# Patient Record
Sex: Male | Born: 1975 | Race: White | Hispanic: No | Marital: Single | Smoking: Current every day smoker
Health system: Southern US, Community
[De-identification: ages and names within clinical notes are randomized; demographics above are authoritative.]

## PROBLEM LIST (undated history)

## (undated) DIAGNOSIS — I10 Essential (primary) hypertension: Secondary | ICD-10-CM

## (undated) DIAGNOSIS — G8929 Other chronic pain: Secondary | ICD-10-CM

## (undated) DIAGNOSIS — M51369 Other intervertebral disc degeneration, lumbar region without mention of lumbar back pain or lower extremity pain: Secondary | ICD-10-CM

## (undated) DIAGNOSIS — R918 Other nonspecific abnormal finding of lung field: Secondary | ICD-10-CM

## (undated) DIAGNOSIS — M549 Dorsalgia, unspecified: Secondary | ICD-10-CM

## (undated) DIAGNOSIS — S82202K Unspecified fracture of shaft of left tibia, subsequent encounter for closed fracture with nonunion: Secondary | ICD-10-CM

## (undated) DIAGNOSIS — F1910 Other psychoactive substance abuse, uncomplicated: Secondary | ICD-10-CM

## (undated) DIAGNOSIS — F909 Attention-deficit hyperactivity disorder, unspecified type: Secondary | ICD-10-CM

## (undated) DIAGNOSIS — B1920 Unspecified viral hepatitis C without hepatic coma: Secondary | ICD-10-CM

## (undated) DIAGNOSIS — F32A Depression, unspecified: Secondary | ICD-10-CM

## (undated) DIAGNOSIS — M5136 Other intervertebral disc degeneration, lumbar region: Secondary | ICD-10-CM

## (undated) DIAGNOSIS — M4640 Discitis, unspecified, site unspecified: Secondary | ICD-10-CM

## (undated) DIAGNOSIS — Z8711 Personal history of peptic ulcer disease: Secondary | ICD-10-CM

## (undated) DIAGNOSIS — Z87442 Personal history of urinary calculi: Secondary | ICD-10-CM

## (undated) DIAGNOSIS — M5430 Sciatica, unspecified side: Secondary | ICD-10-CM

## (undated) DIAGNOSIS — E119 Type 2 diabetes mellitus without complications: Secondary | ICD-10-CM

## (undated) DIAGNOSIS — K219 Gastro-esophageal reflux disease without esophagitis: Secondary | ICD-10-CM

## (undated) DIAGNOSIS — Z8719 Personal history of other diseases of the digestive system: Secondary | ICD-10-CM

## (undated) DIAGNOSIS — J449 Chronic obstructive pulmonary disease, unspecified: Secondary | ICD-10-CM

## (undated) DIAGNOSIS — F329 Major depressive disorder, single episode, unspecified: Secondary | ICD-10-CM

## (undated) DIAGNOSIS — Z8639 Personal history of other endocrine, nutritional and metabolic disease: Secondary | ICD-10-CM

## (undated) DIAGNOSIS — F4310 Post-traumatic stress disorder, unspecified: Secondary | ICD-10-CM

## (undated) DIAGNOSIS — F319 Bipolar disorder, unspecified: Secondary | ICD-10-CM

## (undated) HISTORY — DX: Chronic obstructive pulmonary disease, unspecified: J44.9

## (undated) HISTORY — DX: Essential (primary) hypertension: I10

## (undated) HISTORY — DX: Discitis, unspecified, site unspecified: M46.40

## (undated) HISTORY — PX: FRACTURE SURGERY: SHX138

## (undated) HISTORY — DX: Personal history of other endocrine, nutritional and metabolic disease: Z86.39

## (undated) HISTORY — DX: Unspecified fracture of shaft of left tibia, subsequent encounter for closed fracture with nonunion: S82.202K

## (undated) HISTORY — DX: Post-traumatic stress disorder, unspecified: F43.10

## (undated) HISTORY — DX: Other nonspecific abnormal finding of lung field: R91.8

---

## 2000-06-16 ENCOUNTER — Encounter: Payer: Self-pay | Admitting: Emergency Medicine

## 2000-06-16 ENCOUNTER — Emergency Department (HOSPITAL_COMMUNITY): Admission: EM | Admit: 2000-06-16 | Discharge: 2000-06-16 | Payer: Self-pay | Admitting: Emergency Medicine

## 2000-09-02 ENCOUNTER — Emergency Department (HOSPITAL_COMMUNITY): Admission: EM | Admit: 2000-09-02 | Discharge: 2000-09-02 | Payer: Self-pay | Admitting: Emergency Medicine

## 2000-11-06 ENCOUNTER — Encounter: Payer: Self-pay | Admitting: Emergency Medicine

## 2000-11-06 ENCOUNTER — Emergency Department (HOSPITAL_COMMUNITY): Admission: EM | Admit: 2000-11-06 | Discharge: 2000-11-06 | Payer: Self-pay | Admitting: Emergency Medicine

## 2001-03-13 ENCOUNTER — Emergency Department (HOSPITAL_COMMUNITY): Admission: EM | Admit: 2001-03-13 | Discharge: 2001-03-13 | Payer: Self-pay | Admitting: *Deleted

## 2001-03-22 ENCOUNTER — Encounter: Payer: Self-pay | Admitting: Emergency Medicine

## 2001-03-22 ENCOUNTER — Emergency Department (HOSPITAL_COMMUNITY): Admission: EM | Admit: 2001-03-22 | Discharge: 2001-03-22 | Payer: Self-pay | Admitting: Emergency Medicine

## 2002-08-27 ENCOUNTER — Emergency Department (HOSPITAL_COMMUNITY): Admission: EM | Admit: 2002-08-27 | Discharge: 2002-08-27 | Payer: Self-pay | Admitting: Emergency Medicine

## 2002-09-23 ENCOUNTER — Emergency Department (HOSPITAL_COMMUNITY): Admission: EM | Admit: 2002-09-23 | Discharge: 2002-09-23 | Payer: Self-pay | Admitting: *Deleted

## 2002-10-18 ENCOUNTER — Emergency Department (HOSPITAL_COMMUNITY): Admission: EM | Admit: 2002-10-18 | Discharge: 2002-10-19 | Payer: Self-pay | Admitting: Internal Medicine

## 2003-11-01 ENCOUNTER — Emergency Department (HOSPITAL_COMMUNITY): Admission: EM | Admit: 2003-11-01 | Discharge: 2003-11-01 | Payer: Self-pay | Admitting: Emergency Medicine

## 2004-01-12 ENCOUNTER — Emergency Department (HOSPITAL_COMMUNITY): Admission: EM | Admit: 2004-01-12 | Discharge: 2004-01-12 | Payer: Self-pay | Admitting: Emergency Medicine

## 2004-01-21 ENCOUNTER — Emergency Department (HOSPITAL_COMMUNITY): Admission: EM | Admit: 2004-01-21 | Discharge: 2004-01-21 | Payer: Self-pay | Admitting: Emergency Medicine

## 2004-03-03 ENCOUNTER — Emergency Department (HOSPITAL_COMMUNITY): Admission: EM | Admit: 2004-03-03 | Discharge: 2004-03-03 | Payer: Self-pay | Admitting: Emergency Medicine

## 2004-07-26 ENCOUNTER — Emergency Department (HOSPITAL_COMMUNITY): Admission: EM | Admit: 2004-07-26 | Discharge: 2004-07-26 | Payer: Self-pay | Admitting: Emergency Medicine

## 2004-08-02 ENCOUNTER — Emergency Department (HOSPITAL_COMMUNITY): Admission: EM | Admit: 2004-08-02 | Discharge: 2004-08-02 | Payer: Self-pay | Admitting: Emergency Medicine

## 2004-08-05 ENCOUNTER — Emergency Department (HOSPITAL_COMMUNITY): Admission: EM | Admit: 2004-08-05 | Discharge: 2004-08-06 | Payer: Self-pay | Admitting: *Deleted

## 2004-11-10 ENCOUNTER — Emergency Department (HOSPITAL_COMMUNITY): Admission: EM | Admit: 2004-11-10 | Discharge: 2004-11-10 | Payer: Self-pay | Admitting: Emergency Medicine

## 2005-01-11 ENCOUNTER — Emergency Department (HOSPITAL_COMMUNITY): Admission: EM | Admit: 2005-01-11 | Discharge: 2005-01-11 | Payer: Self-pay | Admitting: Emergency Medicine

## 2005-06-23 ENCOUNTER — Emergency Department (HOSPITAL_COMMUNITY): Admission: EM | Admit: 2005-06-23 | Discharge: 2005-06-23 | Payer: Self-pay | Admitting: Emergency Medicine

## 2005-06-28 ENCOUNTER — Emergency Department (HOSPITAL_COMMUNITY): Admission: EM | Admit: 2005-06-28 | Discharge: 2005-06-28 | Payer: Self-pay | Admitting: Emergency Medicine

## 2005-10-20 ENCOUNTER — Emergency Department (HOSPITAL_COMMUNITY): Admission: EM | Admit: 2005-10-20 | Discharge: 2005-10-20 | Payer: Self-pay | Admitting: Emergency Medicine

## 2006-01-10 ENCOUNTER — Emergency Department (HOSPITAL_COMMUNITY): Admission: EM | Admit: 2006-01-10 | Discharge: 2006-01-10 | Payer: Self-pay | Admitting: Emergency Medicine

## 2006-01-20 ENCOUNTER — Emergency Department (HOSPITAL_COMMUNITY): Admission: EM | Admit: 2006-01-20 | Discharge: 2006-01-20 | Payer: Self-pay | Admitting: Emergency Medicine

## 2006-01-20 ENCOUNTER — Ambulatory Visit: Payer: Self-pay | Admitting: Nurse Practitioner

## 2006-01-31 ENCOUNTER — Ambulatory Visit: Payer: Self-pay | Admitting: Nurse Practitioner

## 2006-02-26 ENCOUNTER — Emergency Department (HOSPITAL_COMMUNITY): Admission: EM | Admit: 2006-02-26 | Discharge: 2006-02-26 | Payer: Self-pay | Admitting: Emergency Medicine

## 2006-08-28 ENCOUNTER — Emergency Department (HOSPITAL_COMMUNITY): Admission: EM | Admit: 2006-08-28 | Discharge: 2006-08-28 | Payer: Self-pay | Admitting: Emergency Medicine

## 2006-09-28 ENCOUNTER — Encounter (INDEPENDENT_AMBULATORY_CARE_PROVIDER_SITE_OTHER): Payer: Self-pay | Admitting: *Deleted

## 2007-10-04 ENCOUNTER — Emergency Department (HOSPITAL_COMMUNITY): Admission: EM | Admit: 2007-10-04 | Discharge: 2007-10-04 | Payer: Self-pay | Admitting: Emergency Medicine

## 2007-10-15 ENCOUNTER — Emergency Department (HOSPITAL_COMMUNITY): Admission: EM | Admit: 2007-10-15 | Discharge: 2007-10-15 | Payer: Self-pay | Admitting: Family Medicine

## 2007-10-19 ENCOUNTER — Ambulatory Visit: Payer: Self-pay | Admitting: Internal Medicine

## 2007-11-01 ENCOUNTER — Emergency Department (HOSPITAL_COMMUNITY): Admission: EM | Admit: 2007-11-01 | Discharge: 2007-11-01 | Payer: Self-pay | Admitting: Emergency Medicine

## 2007-11-09 ENCOUNTER — Ambulatory Visit: Payer: Self-pay | Admitting: Internal Medicine

## 2007-12-09 ENCOUNTER — Emergency Department (HOSPITAL_COMMUNITY): Admission: EM | Admit: 2007-12-09 | Discharge: 2007-12-09 | Payer: Self-pay | Admitting: Emergency Medicine

## 2007-12-21 ENCOUNTER — Ambulatory Visit: Payer: Self-pay | Admitting: Internal Medicine

## 2008-02-05 ENCOUNTER — Emergency Department (HOSPITAL_COMMUNITY): Admission: EM | Admit: 2008-02-05 | Discharge: 2008-02-05 | Payer: Self-pay | Admitting: Emergency Medicine

## 2008-02-25 ENCOUNTER — Emergency Department (HOSPITAL_COMMUNITY): Admission: EM | Admit: 2008-02-25 | Discharge: 2008-02-25 | Payer: Self-pay | Admitting: Emergency Medicine

## 2008-12-13 ENCOUNTER — Emergency Department (HOSPITAL_COMMUNITY): Admission: EM | Admit: 2008-12-13 | Discharge: 2008-12-13 | Payer: Self-pay | Admitting: Emergency Medicine

## 2009-05-12 ENCOUNTER — Emergency Department (HOSPITAL_COMMUNITY): Admission: EM | Admit: 2009-05-12 | Discharge: 2009-05-12 | Payer: Self-pay | Admitting: Emergency Medicine

## 2010-02-01 ENCOUNTER — Encounter: Payer: Self-pay | Admitting: Emergency Medicine

## 2010-02-14 ENCOUNTER — Emergency Department (HOSPITAL_COMMUNITY)
Admission: EM | Admit: 2010-02-14 | Discharge: 2010-02-14 | Disposition: A | Discharge status: Home / Self Care | Payer: Self-pay | Attending: Emergency Medicine | Admitting: Emergency Medicine

## 2010-02-14 DIAGNOSIS — G8929 Other chronic pain: Secondary | ICD-10-CM | POA: Insufficient documentation

## 2010-02-14 DIAGNOSIS — Z8619 Personal history of other infectious and parasitic diseases: Secondary | ICD-10-CM | POA: Insufficient documentation

## 2010-02-14 DIAGNOSIS — M545 Low back pain, unspecified: Secondary | ICD-10-CM | POA: Insufficient documentation

## 2010-03-09 ENCOUNTER — Emergency Department (HOSPITAL_COMMUNITY)
Admission: EM | Admit: 2010-03-09 | Discharge: 2010-03-09 | Disposition: A | Discharge status: Home / Self Care | Payer: Self-pay | Attending: Emergency Medicine | Admitting: Emergency Medicine

## 2010-03-09 DIAGNOSIS — K089 Disorder of teeth and supporting structures, unspecified: Secondary | ICD-10-CM | POA: Insufficient documentation

## 2010-03-22 ENCOUNTER — Emergency Department: Payer: Self-pay | Admitting: Emergency Medicine

## 2010-03-24 ENCOUNTER — Emergency Department (HOSPITAL_COMMUNITY)
Admission: EM | Admit: 2010-03-24 | Discharge: 2010-03-24 | Disposition: A | Discharge status: Home / Self Care | Payer: Self-pay | Attending: Emergency Medicine | Admitting: Emergency Medicine

## 2010-03-24 DIAGNOSIS — M549 Dorsalgia, unspecified: Secondary | ICD-10-CM | POA: Insufficient documentation

## 2010-03-24 DIAGNOSIS — IMO0002 Reserved for concepts with insufficient information to code with codable children: Secondary | ICD-10-CM | POA: Insufficient documentation

## 2010-04-09 ENCOUNTER — Emergency Department (HOSPITAL_COMMUNITY)
Admission: EM | Admit: 2010-04-09 | Discharge: 2010-04-10 | Disposition: A | Discharge status: Home / Self Care | Payer: Self-pay | Attending: Emergency Medicine | Admitting: Emergency Medicine

## 2010-04-09 DIAGNOSIS — K089 Disorder of teeth and supporting structures, unspecified: Secondary | ICD-10-CM | POA: Insufficient documentation

## 2010-04-09 DIAGNOSIS — K029 Dental caries, unspecified: Secondary | ICD-10-CM | POA: Insufficient documentation

## 2010-04-14 LAB — URINALYSIS, ROUTINE W REFLEX MICROSCOPIC
Bilirubin Urine: NEGATIVE
Glucose, UA: NEGATIVE mg/dL
Ketones, ur: NEGATIVE mg/dL
Leukocytes, UA: NEGATIVE

## 2010-04-14 LAB — COMPREHENSIVE METABOLIC PANEL
ALT: 212 U/L — ABNORMAL HIGH (ref 0–53)
BUN: 12 mg/dL (ref 6–23)
CO2: 28 mEq/L (ref 19–32)
GFR calc Af Amer: >60 mL/min (ref >60)
Glucose, Bld: 204 mg/dL — ABNORMAL HIGH (ref 70–99)
Potassium: 3.8 mEq/L (ref 3.5–5.1)
Sodium: 138 mEq/L (ref 135–145)
Total Bilirubin: 0.6 mg/dL (ref 0.3–1.2)

## 2010-04-14 LAB — URINE MICROSCOPIC-ADD ON

## 2010-04-14 LAB — CBC
Hemoglobin: 16.5 g/dL (ref 13.0–17.0)
MCHC: 34.7 g/dL (ref 30.0–36.0)
MCV: 93.3 fL (ref 78.0–100.0)
RBC: 5.12 MIL/uL (ref 4.22–5.81)
RDW: 14.6 % (ref 11.5–15.5)
WBC: 5.9 10*3/uL (ref 4.0–10.5)

## 2010-04-14 LAB — DIFFERENTIAL
Basophils Absolute: 0 10*3/uL (ref 0.0–0.1)
Basophils Relative: 0 % (ref 0–1)
Eosinophils Absolute: 0.1 10*3/uL (ref 0.0–0.7)
Eosinophils Relative: 2 % (ref 0–5)
Neutrophils Relative %: 69 % (ref 43–77)

## 2010-04-14 LAB — LIPASE, BLOOD: Lipase: 16 U/L (ref 11–59)

## 2010-04-15 ENCOUNTER — Emergency Department: Payer: Self-pay | Admitting: Emergency Medicine

## 2010-04-16 ENCOUNTER — Emergency Department (HOSPITAL_COMMUNITY)
Admission: EM | Admit: 2010-04-16 | Discharge: 2010-04-16 | Disposition: A | Discharge status: Home / Self Care | Payer: Self-pay | Attending: Emergency Medicine | Admitting: Emergency Medicine

## 2010-04-16 DIAGNOSIS — K089 Disorder of teeth and supporting structures, unspecified: Secondary | ICD-10-CM | POA: Insufficient documentation

## 2010-04-16 DIAGNOSIS — G479 Sleep disorder, unspecified: Secondary | ICD-10-CM | POA: Insufficient documentation

## 2010-04-16 DIAGNOSIS — K029 Dental caries, unspecified: Secondary | ICD-10-CM | POA: Insufficient documentation

## 2010-04-16 DIAGNOSIS — IMO0002 Reserved for concepts with insufficient information to code with codable children: Secondary | ICD-10-CM | POA: Insufficient documentation

## 2010-04-16 DIAGNOSIS — B1920 Unspecified viral hepatitis C without hepatic coma: Secondary | ICD-10-CM | POA: Insufficient documentation

## 2010-05-17 ENCOUNTER — Emergency Department (HOSPITAL_COMMUNITY): Payer: Self-pay

## 2010-05-17 ENCOUNTER — Emergency Department (HOSPITAL_COMMUNITY)
Admission: EM | Admit: 2010-05-17 | Discharge: 2010-05-17 | Disposition: A | Discharge status: Home / Self Care | Payer: Self-pay | Attending: Emergency Medicine | Admitting: Emergency Medicine

## 2010-05-17 DIAGNOSIS — IMO0002 Reserved for concepts with insufficient information to code with codable children: Secondary | ICD-10-CM | POA: Insufficient documentation

## 2010-05-17 DIAGNOSIS — M533 Sacrococcygeal disorders, not elsewhere classified: Secondary | ICD-10-CM | POA: Insufficient documentation

## 2010-05-17 DIAGNOSIS — B1920 Unspecified viral hepatitis C without hepatic coma: Secondary | ICD-10-CM | POA: Insufficient documentation

## 2010-05-17 DIAGNOSIS — Y990 Civilian activity done for income or pay: Secondary | ICD-10-CM | POA: Insufficient documentation

## 2010-05-17 DIAGNOSIS — W19XXXA Unspecified fall, initial encounter: Secondary | ICD-10-CM | POA: Insufficient documentation

## 2010-05-17 DIAGNOSIS — G479 Sleep disorder, unspecified: Secondary | ICD-10-CM | POA: Insufficient documentation

## 2010-06-30 ENCOUNTER — Emergency Department (HOSPITAL_COMMUNITY)
Admission: EM | Admit: 2010-06-30 | Discharge: 2010-06-30 | Disposition: A | Discharge status: Home / Self Care | Payer: Self-pay | Attending: Emergency Medicine | Admitting: Emergency Medicine

## 2010-06-30 DIAGNOSIS — M545 Low back pain, unspecified: Secondary | ICD-10-CM | POA: Insufficient documentation

## 2010-06-30 DIAGNOSIS — R109 Unspecified abdominal pain: Secondary | ICD-10-CM | POA: Insufficient documentation

## 2010-06-30 DIAGNOSIS — IMO0002 Reserved for concepts with insufficient information to code with codable children: Secondary | ICD-10-CM | POA: Insufficient documentation

## 2010-07-14 ENCOUNTER — Emergency Department (HOSPITAL_COMMUNITY)
Admission: EM | Admit: 2010-07-14 | Discharge: 2010-07-15 | Disposition: A | Discharge status: Other Acute Inpatient Hospital | Payer: Self-pay | Attending: Emergency Medicine | Admitting: Emergency Medicine

## 2010-07-14 DIAGNOSIS — IMO0002 Reserved for concepts with insufficient information to code with codable children: Secondary | ICD-10-CM | POA: Insufficient documentation

## 2010-07-14 DIAGNOSIS — F319 Bipolar disorder, unspecified: Secondary | ICD-10-CM | POA: Insufficient documentation

## 2010-07-14 DIAGNOSIS — F1911 Other psychoactive substance abuse, in remission: Secondary | ICD-10-CM | POA: Insufficient documentation

## 2010-07-14 DIAGNOSIS — Z79899 Other long term (current) drug therapy: Secondary | ICD-10-CM | POA: Insufficient documentation

## 2010-07-14 DIAGNOSIS — F17200 Nicotine dependence, unspecified, uncomplicated: Secondary | ICD-10-CM | POA: Insufficient documentation

## 2010-07-14 DIAGNOSIS — R45850 Homicidal ideations: Secondary | ICD-10-CM | POA: Insufficient documentation

## 2010-07-14 LAB — CBC
HCT: 47.8 % (ref 39.0–52.0)
MCHC: 34.3 g/dL (ref 30.0–36.0)
WBC: 8.5 10*3/uL (ref 4.0–10.5)

## 2010-07-14 LAB — URINE MICROSCOPIC-ADD ON

## 2010-07-14 LAB — COMPREHENSIVE METABOLIC PANEL
AST: 21 U/L (ref 0–37)
Albumin: 3.9 g/dL (ref 3.5–5.2)
Calcium: 9.4 mg/dL (ref 8.4–10.5)
Creatinine, Ser: 1.06 mg/dL (ref 0.50–1.35)
GFR calc non Af Amer: >60 mL/min (ref >60)
Glucose, Bld: 114 mg/dL — ABNORMAL HIGH (ref 70–99)
Potassium: 4 mEq/L (ref 3.5–5.1)
Sodium: 138 mEq/L (ref 135–145)
Total Bilirubin: 0.2 mg/dL — ABNORMAL LOW (ref 0.3–1.2)
Total Protein: 7.6 g/dL (ref 6.0–8.3)

## 2010-07-14 LAB — DIFFERENTIAL
Basophils Relative: 0 % (ref 0–1)
Eosinophils Absolute: 0.2 10*3/uL (ref 0.0–0.7)
Lymphocytes Relative: 27 % (ref 12–46)
Monocytes Absolute: 0.5 10*3/uL (ref 0.1–1.0)
Neutro Abs: 5.5 10*3/uL (ref 1.7–7.7)

## 2010-07-14 LAB — URINALYSIS, ROUTINE W REFLEX MICROSCOPIC
Glucose, UA: NEGATIVE mg/dL
Ketones, ur: NEGATIVE mg/dL
Protein, ur: NEGATIVE mg/dL
pH: 6 (ref 5.0–8.0)

## 2010-07-14 LAB — RAPID URINE DRUG SCREEN, HOSP PERFORMED: Amphetamines: NOT DETECTED

## 2010-07-14 LAB — ETHANOL: Alcohol, Ethyl (B): <11 mg/dL (ref 0–11)

## 2010-07-15 ENCOUNTER — Inpatient Hospital Stay (HOSPITAL_COMMUNITY)
Admission: AD | Admit: 2010-07-15 | Discharge: 2010-07-20 | DRG: 885 | Disposition: A | Discharge status: Home / Self Care | Payer: 59 | Attending: Psychiatry | Admitting: Psychiatry

## 2010-07-15 DIAGNOSIS — F1994 Other psychoactive substance use, unspecified with psychoactive substance-induced mood disorder: Secondary | ICD-10-CM

## 2010-07-15 DIAGNOSIS — F1010 Alcohol abuse, uncomplicated: Secondary | ICD-10-CM

## 2010-07-15 DIAGNOSIS — F1410 Cocaine abuse, uncomplicated: Secondary | ICD-10-CM

## 2010-07-15 DIAGNOSIS — R45850 Homicidal ideations: Secondary | ICD-10-CM

## 2010-07-15 DIAGNOSIS — R45851 Suicidal ideations: Secondary | ICD-10-CM

## 2010-07-15 DIAGNOSIS — F319 Bipolar disorder, unspecified: Secondary | ICD-10-CM

## 2010-07-15 DIAGNOSIS — F1920 Other psychoactive substance dependence, uncomplicated: Secondary | ICD-10-CM

## 2010-07-15 DIAGNOSIS — F1310 Sedative, hypnotic or anxiolytic abuse, uncomplicated: Secondary | ICD-10-CM

## 2010-07-21 NOTE — Discharge Summary (Signed)
  NAMEKOBIE, WHIDBY NO.:  [id number]  MEDICAL RECORD NO.:  [id number]  LOCATION:  0301                          FACILITY:  [facility]  PHYSICIAN:  Eulogio Ditch, MD DATE OF BIRTH:  [birth year]  DATE OF ADMISSION:  07/15/2010 DATE OF DISCHARGE:  07/20/2010                              DISCHARGE SUMMARY   HISTORY OF PRESENT ILLNESS:  A 35 year old Caucasian male who was admitted for suicidal thoughts and also verbalized homicidal thoughts towards girlfriend.  The patient was abusing Xanax and Seroquel which he buying from friends and from the street.  The patient also verbalized hearing voices at the time of admission.  During the hospital stay the patient was started on Seroquel 100 mg p.o. b.i.d.  The patient was seen by Dr. Lolly Mustache at the time of admission so he started the patient on the Seroquel.  On July 6 the patient was started on gabapentin 300 mg p.o. b.i.d. and on July 18, 2010, the patient was seen by [facility], physician extender, and the patient was started on Remeron 15 mg at bedtime.  On July 20, 2010, when I saw the patient the patient was very logical and goal directed.  Denied any suicidal or homicidal ideations, was not having any psychotic or manic/depressive symptoms.  We also called the patient's girlfriend and she did not have any safety concerns.  She is going to come and pick the patient and the patient is going to stay with her and follow up at [facility].  The patient denied any side effects from the medications at the time of discharge.  Throughout the stay in the hospital the patient participated in the groups.  His behavior remained under control and he denied any suicidal or homicidal ideations.  Psychoeducation given to the patient.  The patient was motivated at the time of discharge to follow up in the outpatient setting.  DISCHARGE DIAGNOSES:  AXIS I:  Polysubstance dependence, rule out substance induced mood disorder, rule out  bipolar disorder. AXIS II:  Deferred. AXIS III:  No active medical issue. AXIS IV:  Substance abuse. AXIS V:  55.  DISCHARGE MEDICATIONS: 1. Seroquel 100 mg b.i.d. 100 mg at bedtime. 2. Neurontin 300 mg b.i.d. 3. Protonix 40 mg b.i.d. 4. Remeron 15 mg at bedtime.  DISCHARGE FOLLOWUP:  The patient will follow up at [facility], followup appointment July 22, 2010, at 8:00 a.m.     Eulogio Ditch, MD     SA/MEDQ  D:  07/20/2010  T:  07/20/2010  Job:  [id number]  Electronically Signed by Eulogio Ditch  on 07/21/2010 11:20:35 AM

## 2010-07-22 ENCOUNTER — Emergency Department (HOSPITAL_COMMUNITY)
Admission: EM | Admit: 2010-07-22 | Discharge: 2010-07-22 | Disposition: A | Discharge status: Home / Self Care | Payer: Self-pay | Attending: Emergency Medicine | Admitting: Emergency Medicine

## 2010-07-22 ENCOUNTER — Encounter: Payer: Self-pay | Admitting: *Deleted

## 2010-07-22 DIAGNOSIS — F319 Bipolar disorder, unspecified: Secondary | ICD-10-CM | POA: Insufficient documentation

## 2010-07-22 HISTORY — DX: Unspecified viral hepatitis C without hepatic coma: B19.20

## 2010-07-22 HISTORY — DX: Attention-deficit hyperactivity disorder, unspecified type: F90.9

## 2010-07-22 HISTORY — DX: Bipolar disorder, unspecified: F31.9

## 2010-07-22 HISTORY — DX: Major depressive disorder, single episode, unspecified: F32.9

## 2010-07-22 HISTORY — DX: Depression, unspecified: F32.A

## 2010-07-22 LAB — BASIC METABOLIC PANEL
Chloride: 102 mEq/L (ref 96–112)
GFR calc Af Amer: >60 mL/min (ref >60)
GFR calc non Af Amer: >60 mL/min (ref >60)
Potassium: 3.8 mEq/L (ref 3.5–5.1)
Sodium: 140 mEq/L (ref 135–145)

## 2010-07-22 LAB — RAPID URINE DRUG SCREEN, HOSP PERFORMED
Amphetamines: NOT DETECTED
Benzodiazepines: POSITIVE — AB
Cocaine: NOT DETECTED
Opiates: NOT DETECTED

## 2010-07-22 LAB — CBC
Hemoglobin: 16.1 g/dL (ref 13.0–17.0)
MCH: 32.1 pg (ref 26.0–34.0)
MCHC: 33.9 g/dL (ref 30.0–36.0)
Platelets: 165 10*3/uL (ref 150–400)
RBC: 5.02 MIL/uL (ref 4.22–5.81)

## 2010-07-22 LAB — DIFFERENTIAL
Basophils Relative: 0 % (ref 0–1)
Eosinophils Absolute: 0.3 10*3/uL (ref 0.0–0.7)
Lymphs Abs: 1.9 10*3/uL (ref 0.7–4.0)
Monocytes Relative: 7 % (ref 3–12)
Neutro Abs: 4 10*3/uL (ref 1.7–7.7)
Neutrophils Relative %: 61 % (ref 43–77)

## 2010-07-22 MED ORDER — QUETIAPINE FUMARATE 100 MG PO TABS
100.0000 mg | ORAL_TABLET | Freq: Every day | ORAL | Status: AC
  Administered 2010-07-22: 100 mg via ORAL
  Filled 2010-07-22: qty 1

## 2010-07-22 MED ORDER — LORAZEPAM 1 MG PO TABS
1.0000 mg | ORAL_TABLET | Freq: Once | ORAL | Status: AC
  Administered 2010-07-22: 1 mg via ORAL
  Filled 2010-07-22: qty 1

## 2010-07-22 MED ORDER — RISPERIDONE 2 MG PO TABS: 2.0000 mg | ORAL_TABLET | Freq: Every day | ORAL | Status: AC

## 2010-07-22 MED ORDER — SEROQUEL 100 MG PO TABS: 100.0000 mg | ORAL_TABLET | Freq: Two times a day (BID) | ORAL | Status: DC

## 2010-07-22 MED ORDER — OXYCODONE-ACETAMINOPHEN 5-325 MG PO TABS
1.0000 | ORAL_TABLET | Freq: Once | ORAL | Status: AC
  Administered 2010-07-22: 1 via ORAL
  Filled 2010-07-22: qty 1

## 2010-07-22 MED ORDER — OMEPRAZOLE 20 MG PO CPDR: 20.0000 mg | DELAYED_RELEASE_CAPSULE | Freq: Every day | ORAL | Status: DC

## 2010-07-22 MED ORDER — GABAPENTIN 300 MG PO CAPS
300.0000 mg | ORAL_CAPSULE | Freq: Three times a day (TID) | ORAL | Status: DC
  Administered 2010-07-22: 300 mg via ORAL
  Filled 2010-07-22: qty 1

## 2010-07-22 MED ORDER — MIRTAZAPINE 15 MG PO TABS: 15.0000 mg | ORAL_TABLET | Freq: Every day | ORAL | Status: DC

## 2010-07-22 MED ORDER — GABAPENTIN 300 MG PO CAPS: 300.0000 mg | ORAL_CAPSULE | Freq: Three times a day (TID) | ORAL | Status: DC

## 2010-07-22 NOTE — ED Notes (Signed)
Pt alert and cooperative.  Denies any pain or concerns at present time.

## 2010-07-22 NOTE — ED Notes (Signed)
Telepsych consult in progress.  EDPA at bedside.
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
## 2010-07-30 ENCOUNTER — Encounter (HOSPITAL_COMMUNITY): Payer: Self-pay | Admitting: Emergency Medicine

## 2010-07-30 ENCOUNTER — Emergency Department (HOSPITAL_COMMUNITY): Payer: Self-pay

## 2010-07-30 ENCOUNTER — Emergency Department (HOSPITAL_COMMUNITY)
Admission: EM | Admit: 2010-07-30 | Discharge: 2010-07-30 | Disposition: A | Discharge status: Home / Self Care | Payer: Self-pay | Attending: Emergency Medicine | Admitting: Emergency Medicine

## 2010-07-30 DIAGNOSIS — W268XXA Contact with other sharp object(s), not elsewhere classified, initial encounter: Secondary | ICD-10-CM | POA: Insufficient documentation

## 2010-07-30 DIAGNOSIS — W010XXA Fall on same level from slipping, tripping and stumbling without subsequent striking against object, initial encounter: Secondary | ICD-10-CM | POA: Insufficient documentation

## 2010-07-30 DIAGNOSIS — F17200 Nicotine dependence, unspecified, uncomplicated: Secondary | ICD-10-CM | POA: Insufficient documentation

## 2010-07-30 DIAGNOSIS — S91309A Unspecified open wound, unspecified foot, initial encounter: Secondary | ICD-10-CM | POA: Insufficient documentation

## 2010-07-30 DIAGNOSIS — S335XXA Sprain of ligaments of lumbar spine, initial encounter: Secondary | ICD-10-CM | POA: Insufficient documentation

## 2010-07-30 DIAGNOSIS — F909 Attention-deficit hyperactivity disorder, unspecified type: Secondary | ICD-10-CM | POA: Insufficient documentation

## 2010-07-30 DIAGNOSIS — S91319A Laceration without foreign body, unspecified foot, initial encounter: Secondary | ICD-10-CM

## 2010-07-30 DIAGNOSIS — F319 Bipolar disorder, unspecified: Secondary | ICD-10-CM | POA: Insufficient documentation

## 2010-07-30 MED ORDER — HYDROCODONE-ACETAMINOPHEN 5-500 MG PO TABS: 1.0000 | ORAL_TABLET | ORAL | Status: AC | PRN

## 2010-07-30 MED ORDER — DIAZEPAM 5 MG PO TABS
10.0000 mg | ORAL_TABLET | Freq: Once | ORAL | Status: AC
  Administered 2010-07-30: 10 mg via ORAL
  Filled 2010-07-30: qty 2

## 2010-07-30 MED ORDER — HYDROCODONE-ACETAMINOPHEN 5-325 MG PO TABS
2.0000 | ORAL_TABLET | Freq: Once | ORAL | Status: AC
  Administered 2010-07-30: 2 via ORAL
  Filled 2010-07-30: qty 2

## 2010-07-30 MED ORDER — LIDOCAINE HCL 2 % IJ SOLN
INTRAMUSCULAR | Status: AC
  Filled 2010-07-30: qty 1

## 2010-07-30 MED ORDER — TETANUS-DIPHTHERIA TOXOIDS TD 5-2 LFU IM INJ
0.5000 mL | INJECTION | Freq: Once | INTRAMUSCULAR | Status: AC
  Administered 2010-07-30: 0.5 mL via INTRAMUSCULAR
  Filled 2010-07-30: qty 0.5

## 2010-07-30 MED ORDER — DEXAMETHASONE SODIUM PHOSPHATE 4 MG/ML IJ SOLN
4.0000 mg | Freq: Once | INTRAMUSCULAR | Status: AC
  Administered 2010-07-30: 4 mg via INTRAMUSCULAR
  Filled 2010-07-30: qty 1

## 2010-07-30 MED ORDER — DIAZEPAM 5 MG/ML IJ SOLN: 10.0000 mg | Freq: Once | INTRAMUSCULAR | Status: DC

## 2010-07-30 MED ORDER — LIDOCAINE HCL 2 % IJ SOLN
20.0000 mL | Freq: Once | INTRAMUSCULAR | Status: AC
  Administered 2010-07-30: 17:00:00 via INTRADERMAL

## 2010-07-30 MED ORDER — ONDANSETRON HCL 4 MG PO TABS
4.0000 mg | ORAL_TABLET | Freq: Once | ORAL | Status: AC
  Administered 2010-07-30: 4 mg via ORAL
  Filled 2010-07-30: qty 1

## 2010-07-30 NOTE — ED Notes (Signed)
Pt punctured his left foot with glass this am.

## 2010-07-30 NOTE — ED Notes (Signed)
Patient with no complaints at this time. Respirations even and unlabored. Skin warm/dry. Discharge instructions reviewed with patient at this time. Patient given opportunity to voice concerns/ask questions. Patient discharged at this time and left Emergency Department with steady gait.   

## 2010-07-30 NOTE — ED Notes (Signed)
Patient requesting pain medicine. Link Snuffer, PA notified.

## 2010-07-30 NOTE — ED Provider Notes (Signed)
History     Chief Complaint  Patient presents with  . Laceration   HPI Comments: Pt and spouse state pt was carrying a table with a glass insert. Pt slipped on a wet surface and cut his foot on the broken glass. He also reports injuring the lower back. He has had back problems in the past. Pt not sure of his last tetanus  Patient is a 35 y.o. male presenting with skin laceration. The history is provided by the patient and the spouse.  Laceration  The incident occurred 3 to 5 hours ago. The laceration is located on the left foot. The laceration is 2 cm in size. The laceration mechanism was a broken glass. The pain is moderate. The pain has been constant since onset. His tetanus status is unknown.    Past Medical History  Diagnosis Date  . Depression   . Bipolar 1 disorder   . ADHD (attention deficit hyperactivity disorder)   . Hepatitis C     History reviewed. No pertinent past surgical history.  Family History  Problem Relation Age of Onset  . Diabetes Mother     History  Substance Use Topics  . Smoking status: Current Everyday Smoker -- 1.0 packs/day for 24 years    Types: Cigarettes  . Smokeless tobacco: Not on file  . Alcohol Use: Yes     occasionally liquor      Review of Systems  Constitutional: Negative for activity change.       All ROS Neg except as noted in HPI  HENT: Negative for nosebleeds and neck pain.   Eyes: Negative for photophobia and discharge.  Respiratory: Negative for cough, shortness of breath and wheezing.   Cardiovascular: Negative for chest pain and palpitations.  Gastrointestinal: Negative for abdominal pain and blood in stool.  Genitourinary: Negative for dysuria, frequency and hematuria.  Musculoskeletal: Positive for back pain. Negative for arthralgias.  Skin: Positive for wound.  Neurological: Negative for dizziness, seizures and speech difficulty.  Psychiatric/Behavioral: Positive for behavioral problems and agitation. Negative for  hallucinations and confusion.    Physical Exam  BP 129/86  Pulse 98  Temp(Src) 98.8 F (37.1 C) (Oral)  Resp 20  Ht 5\' 3"  (1.6 m)  Wt 160 lb (72.576 kg)  BMI 28.34 kg/m2  SpO2 97%  Physical Exam  Nursing note and vitals reviewed. Constitutional: He is oriented to person, place, and time. He appears well-developed and well-nourished.  Non-toxic appearance.  HENT:  Head: Normocephalic.  Right Ear: Tympanic membrane and external ear normal.  Left Ear: Tympanic membrane and external ear normal.  Eyes: EOM and lids are normal. Pupils are equal, round, and reactive to light.  Neck: Normal range of motion. Neck supple. Carotid bruit is not present.  Cardiovascular: Normal rate, regular rhythm, normal heart sounds, intact distal pulses and normal pulses.   Pulmonary/Chest: Breath sounds normal. No respiratory distress.  Abdominal: Soft. Bowel sounds are normal. There is no tenderness. There is no guarding.  Musculoskeletal:       Lumbar back: He exhibits decreased range of motion, pain and spasm.       Left foot: He exhibits tenderness and laceration. He exhibits normal range of motion.       Feet:  Lymphadenopathy:       Head (right side): No submandibular adenopathy present.       Head (left side): No submandibular adenopathy present.    He has no cervical adenopathy.  Neurological: He is alert and oriented to  person, place, and time. He has normal strength. No cranial nerve deficit or sensory deficit.  Skin: Skin is warm and dry.  Psychiatric: He has a normal mood and affect. His speech is normal.    ED Course  LACERATION REPAIR Date/Time: 07/30/2010 5:35 PM Performed by: Kathie Dike Authorized by: Nelia Shi Consent: Verbal consent obtained. Consent given by: patient Imaging studies: imaging studies available Patient identity confirmed: arm band Time out: Immediately prior to procedure a "time out" was called to verify the correct patient, procedure, equipment,  support staff and site/side marked as required. Body area: lower extremity Location details: left foot Laceration length: 1.5 cm Foreign bodies: no foreign bodies Tendon involvement: none Nerve involvement: none Vascular damage: no Anesthesia: local infiltration Local anesthetic: lidocaine 2% without epinephrine Preparation: Patient was prepped and draped in the usual sterile fashion. Irrigation solution: saline Irrigation method: syringe Amount of cleaning: standard Debridement: none Degree of undermining: none Skin closure: staples Number of sutures: 2 Approximation: close Approximation difficulty: simple Dressing: 4x4 sterile gauze Patient tolerance: Patient tolerated the procedure well with no immediate complications. Comments: Sterile dressing applied by me. Tetanus ordered.    MDM I have reviewed nursing notes, vital signs, and all appropriate lab and imaging results for this patient.      Kathie Dike, [place] 07/30/10 1739

## 2010-07-30 NOTE — ED Notes (Signed)
Lidocaine pulled from pyxis and given to [facility]. Suture cart to room.

## 2010-08-05 NOTE — ED Provider Notes (Deleted)
History     Chief Complaint  Patient presents with  . Laceration   HPI  Past Medical History  Diagnosis Date  . Depression   . Bipolar 1 disorder   . ADHD (attention deficit hyperactivity disorder)   . Hepatitis C     History reviewed. No pertinent past surgical history.  Family History  Problem Relation Age of Onset  . Diabetes Mother     History  Substance Use Topics  . Smoking status: Current Everyday Smoker -- 1.0 packs/day for 24 years    Types: Cigarettes  . Smokeless tobacco: Not on file  . Alcohol Use: Yes     occasionally liquor      Review of Systems  Physical Exam  BP 129/86  Pulse 98  Temp(Src) 98.8 F (37.1 C) (Oral)  Resp 20  Ht 5\' 3"  (1.6 m)  Wt 160 lb (72.576 kg)  BMI 28.34 kg/m2  SpO2 97%  Physical Exam  ED Course  Procedures  MDM Medical screening examination/treatment/procedure(s) were performed by non-physician practitioner and as supervising physician I was immediately available for consultation/collaboration.       Nelia Shi, MD 08/05/10 2221

## 2010-08-08 ENCOUNTER — Emergency Department (HOSPITAL_COMMUNITY)
Admission: EM | Admit: 2010-08-08 | Discharge: 2010-08-08 | Disposition: A | Discharge status: Home / Self Care | Payer: Self-pay | Attending: Emergency Medicine | Admitting: Emergency Medicine

## 2010-08-08 ENCOUNTER — Emergency Department (HOSPITAL_COMMUNITY): Payer: Self-pay

## 2010-08-08 ENCOUNTER — Encounter (HOSPITAL_COMMUNITY): Payer: Self-pay | Admitting: Emergency Medicine

## 2010-08-08 DIAGNOSIS — F17200 Nicotine dependence, unspecified, uncomplicated: Secondary | ICD-10-CM | POA: Insufficient documentation

## 2010-08-08 DIAGNOSIS — R05 Cough: Secondary | ICD-10-CM | POA: Insufficient documentation

## 2010-08-08 DIAGNOSIS — Z4802 Encounter for removal of sutures: Secondary | ICD-10-CM | POA: Insufficient documentation

## 2010-08-08 DIAGNOSIS — R059 Cough, unspecified: Secondary | ICD-10-CM | POA: Insufficient documentation

## 2010-08-08 DIAGNOSIS — R109 Unspecified abdominal pain: Secondary | ICD-10-CM | POA: Insufficient documentation

## 2010-08-08 DIAGNOSIS — R630 Anorexia: Secondary | ICD-10-CM | POA: Insufficient documentation

## 2010-08-08 DIAGNOSIS — J189 Pneumonia, unspecified organism: Secondary | ICD-10-CM | POA: Insufficient documentation

## 2010-08-08 DIAGNOSIS — R112 Nausea with vomiting, unspecified: Secondary | ICD-10-CM | POA: Insufficient documentation

## 2010-08-08 DIAGNOSIS — R509 Fever, unspecified: Secondary | ICD-10-CM | POA: Insufficient documentation

## 2010-08-08 DIAGNOSIS — M549 Dorsalgia, unspecified: Secondary | ICD-10-CM | POA: Insufficient documentation

## 2010-08-08 LAB — DIFFERENTIAL
Basophils Relative: 0 % (ref 0–1)
Eosinophils Absolute: 0.1 10*3/uL (ref 0.0–0.7)
Eosinophils Relative: 1 % (ref 0–5)
Monocytes Absolute: 0.7 10*3/uL (ref 0.1–1.0)
Monocytes Relative: 8 % (ref 3–12)
Neutro Abs: 5.9 10*3/uL (ref 1.7–7.7)

## 2010-08-08 LAB — CBC
HCT: 45.6 % (ref 39.0–52.0)
Hemoglobin: 15.8 g/dL (ref 13.0–17.0)
MCH: 32.2 pg (ref 26.0–34.0)
MCHC: 34.6 g/dL (ref 30.0–36.0)
MCV: 93.1 fL (ref 78.0–100.0)
RDW: 12.9 % (ref 11.5–15.5)

## 2010-08-08 LAB — COMPREHENSIVE METABOLIC PANEL
Albumin: 3.6 g/dL (ref 3.5–5.2)
BUN: 13 mg/dL (ref 6–23)
Calcium: 9.7 mg/dL (ref 8.4–10.5)
Chloride: 101 mEq/L (ref 96–112)
Creatinine, Ser: 0.81 mg/dL (ref 0.50–1.35)
Total Bilirubin: 0.3 mg/dL (ref 0.3–1.2)

## 2010-08-08 LAB — URINALYSIS, ROUTINE W REFLEX MICROSCOPIC
Glucose, UA: NEGATIVE mg/dL
Leukocytes, UA: NEGATIVE
Protein, ur: NEGATIVE mg/dL
Specific Gravity, Urine: 1.01 (ref 1.005–1.030)
Urobilinogen, UA: 0.2 mg/dL (ref 0.0–1.0)

## 2010-08-08 LAB — URINE MICROSCOPIC-ADD ON

## 2010-08-08 MED ORDER — IOHEXOL 300 MG/ML  SOLN
100.0000 mL | Freq: Once | INTRAMUSCULAR | Status: AC | PRN
  Administered 2010-08-08: 100 mL via INTRAVENOUS

## 2010-08-08 MED ORDER — HYDROMORPHONE HCL 2 MG/ML IJ SOLN
2.0000 mg | Freq: Once | INTRAMUSCULAR | Status: AC
  Administered 2010-08-08: 2 mg via INTRAVENOUS
  Filled 2010-08-08: qty 1

## 2010-08-08 MED ORDER — OXYCODONE HCL 5 MG PO TABS: ORAL_TABLET | ORAL | Status: DC

## 2010-08-08 MED ORDER — SODIUM CHLORIDE 0.9 % IV SOLN
Freq: Once | INTRAVENOUS | Status: AC
  Administered 2010-08-08: 19:00:00 via INTRAVENOUS

## 2010-08-08 MED ORDER — AZITHROMYCIN 250 MG PO TABS: ORAL_TABLET | ORAL | Status: DC

## 2010-08-08 MED ORDER — AZITHROMYCIN 250 MG PO TABS
500.0000 mg | ORAL_TABLET | Freq: Once | ORAL | Status: AC
  Administered 2010-08-08: 500 mg via ORAL
  Filled 2010-08-08: qty 2

## 2010-08-08 MED ORDER — CEFTRIAXONE SODIUM 1 G IJ SOLR
1.0000 g | INTRAMUSCULAR | Status: DC
  Administered 2010-08-08: 1 g via INTRAMUSCULAR
  Filled 2010-08-08: qty 1

## 2010-08-08 MED ORDER — ONDANSETRON HCL 4 MG/2ML IJ SOLN
4.0000 mg | Freq: Once | INTRAMUSCULAR | Status: AC
  Administered 2010-08-08: 4 mg via INTRAVENOUS
  Filled 2010-08-08: qty 2

## 2010-08-08 NOTE — ED Notes (Signed)
Pt is very upset about not being seen after two hours in his room

## 2010-08-08 NOTE — ED Provider Notes (Signed)
History     Chief Complaint  Patient presents with  . Suture / Staple Removal  . Cough  . Abdominal Pain    nausea, vomiting, fever  . Back Pain   Patient is a 35 y.o. male presenting with suture removal, cough, abdominal pain, and back pain. The history is provided by the patient. No language interpreter was used.  Suture / Staple Removal  The sutures were placed 7 to 10 days ago. There has been no treatment since the wound repair. The fever has been present for less than 1 day. His temperature was unmeasured prior to arrival. There has been no drainage from the wound. Redness Status: mild erythema.  Cough This is a new problem. The current episode started yesterday. The cough is productive of purulent sputum. The fever has been present for 1 to 2 days. Associated symptoms include chills. Pertinent negatives include no chest pain.  Abdominal Pain The primary symptoms of the illness include abdominal pain, nausea and vomiting. The primary symptoms of the illness do not include diarrhea. The current episode started yesterday. The onset of the illness was sudden. The problem has been gradually worsening.  The illness is associated with eating. The patient states that she believes she is currently not pregnant. The patient has not had a change in bowel habit. Additional symptoms associated with the illness include chills, anorexia, diaphoresis and back pain.  Back Pain  Associated symptoms include abdominal pain. Pertinent negatives include no chest pain.    Past Medical History  Diagnosis Date  . Depression   . Bipolar 1 disorder   . ADHD (attention deficit hyperactivity disorder)   . Hepatitis C     History reviewed. No pertinent past surgical history.  Family History  Problem Relation Age of Onset  . Diabetes Mother     History  Substance Use Topics  . Smoking status: Current Everyday Smoker -- 1.0 packs/day for 24 years    Types: Cigarettes  . Smokeless tobacco: Former Neurosurgeon     Quit date: 08/08/2006  . Alcohol Use: Yes     occasionally liquor      Review of Systems  Constitutional: Positive for chills and diaphoresis.  Respiratory: Positive for cough.   Cardiovascular: Negative for chest pain.  Gastrointestinal: Positive for nausea, vomiting, abdominal pain and anorexia. Negative for diarrhea.  Musculoskeletal: Positive for back pain.    Physical Exam  BP 148/92  Pulse 88  Temp(Src) 98.5 F (36.9 C) (Oral)  Resp 16  Ht 5\' 3"  (1.6 m)  Wt 160 lb (72.576 kg)  BMI 28.34 kg/m2  SpO2 100%  Physical Exam  ED Course  Procedures  MDM Vomiting and appears to be in considerable pain.      Worthy Rancher, PA 08/08/10 [id number]

## 2010-08-08 NOTE — ED Notes (Signed)
Patient c/o cough, sore throat, fever, nausea, vomiting (unsure of how many times), abd pain, back pain, and fevers. Denies any diarrhea. No active vomiting noted. Patient has staples in left foot that was placed here. Per patient staples were supposed to be removed 2-3 days ago.

## 2010-08-08 NOTE — ED Notes (Signed)
Pt given pain and nausea meds. Pt asked for urine specimen will use call light when he has urine.
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
## 2010-08-09 NOTE — ED Provider Notes (Signed)
Medical screening examination/treatment/procedure(s) were performed by non-physician practitioner and as supervising physician I was immediately available for consultation/collaboration.   Nelia Shi, MD 08/09/10 [phone]

## 2010-08-12 NOTE — ED Provider Notes (Signed)
Medical screening examination/treatment/procedure(s) were performed by non-physician practitioner and as supervising physician I was immediately available for consultation/collaboration.   Nelia Shi, MD 08/12/10 [phone]

## 2010-08-16 ENCOUNTER — Emergency Department (HOSPITAL_COMMUNITY)
Admission: EM | Admit: 2010-08-16 | Discharge: 2010-08-16 | Disposition: A | Discharge status: Home / Self Care | Payer: Self-pay | Attending: Emergency Medicine | Admitting: Emergency Medicine

## 2010-08-16 ENCOUNTER — Emergency Department (HOSPITAL_COMMUNITY): Payer: Self-pay

## 2010-08-16 DIAGNOSIS — M545 Low back pain, unspecified: Secondary | ICD-10-CM | POA: Insufficient documentation

## 2010-08-16 DIAGNOSIS — Z8619 Personal history of other infectious and parasitic diseases: Secondary | ICD-10-CM | POA: Insufficient documentation

## 2010-08-16 DIAGNOSIS — M5430 Sciatica, unspecified side: Secondary | ICD-10-CM | POA: Insufficient documentation

## 2010-08-16 DIAGNOSIS — M549 Dorsalgia, unspecified: Secondary | ICD-10-CM | POA: Insufficient documentation

## 2010-08-21 NOTE — ED Provider Notes (Signed)
History     CSN: [id number] Arrival date & time: 07/22/2010  1:49 PM  Chief Complaint  Patient presents with  . Psychiatric Evaluation   HPI  Past Medical History  Diagnosis Date  . Depression   . Bipolar 1 disorder   . ADHD (attention deficit hyperactivity disorder)   . Hepatitis C     History reviewed. No pertinent past surgical history.  Family History  Problem Relation Age of Onset  . Diabetes Mother     History  Substance Use Topics  . Smoking status: Current Everyday Smoker -- 1.0 packs/day for 24 years    Types: Cigarettes  . Smokeless tobacco: Former Neurosurgeon    Quit date: 08/08/2006  . Alcohol Use: Yes     occasionally liquor      Review of Systems  Physical Exam  BP 153/117  Pulse 68  Temp(Src) 98.4 F (36.9 C) (Oral)  Resp 18  Ht 5\' 3"  (1.6 m)  Wt 160 lb (72.576 kg)  BMI 28.34 kg/m2  SpO2 96%  Physical Exam  ED Course  Procedures  MDM     Medical screening examination/treatment/procedure(s) were performed by non-physician practitioner and as supervising physician I was immediately available for consultation/collaboration.  Donnetta Hutching, MD 08/21/10 [phone]

## 2010-08-22 ENCOUNTER — Emergency Department (HOSPITAL_COMMUNITY)
Admission: EM | Admit: 2010-08-22 | Discharge: 2010-08-22 | Disposition: A | Discharge status: Home / Self Care | Payer: Self-pay | Attending: Emergency Medicine | Admitting: Emergency Medicine

## 2010-08-22 DIAGNOSIS — M545 Low back pain, unspecified: Secondary | ICD-10-CM | POA: Insufficient documentation

## 2010-08-22 DIAGNOSIS — IMO0002 Reserved for concepts with insufficient information to code with codable children: Secondary | ICD-10-CM | POA: Insufficient documentation

## 2010-08-22 DIAGNOSIS — B1920 Unspecified viral hepatitis C without hepatic coma: Secondary | ICD-10-CM | POA: Insufficient documentation

## 2010-08-22 DIAGNOSIS — G8929 Other chronic pain: Secondary | ICD-10-CM | POA: Insufficient documentation

## 2010-08-27 ENCOUNTER — Other Ambulatory Visit: Payer: Self-pay | Admitting: Internal Medicine

## 2010-08-27 DIAGNOSIS — M545 Low back pain: Secondary | ICD-10-CM

## 2010-08-30 ENCOUNTER — Ambulatory Visit (HOSPITAL_COMMUNITY)
Admission: RE | Admit: 2010-08-30 | Discharge: 2010-08-30 | Disposition: A | Discharge status: Home / Self Care | Payer: Self-pay | Source: Ambulatory Visit | Attending: Internal Medicine | Admitting: Internal Medicine

## 2010-08-30 DIAGNOSIS — M545 Low back pain, unspecified: Secondary | ICD-10-CM | POA: Insufficient documentation

## 2010-08-30 DIAGNOSIS — M79609 Pain in unspecified limb: Secondary | ICD-10-CM | POA: Insufficient documentation

## 2010-08-30 DIAGNOSIS — M5126 Other intervertebral disc displacement, lumbar region: Secondary | ICD-10-CM | POA: Insufficient documentation

## 2010-09-02 ENCOUNTER — Emergency Department (HOSPITAL_COMMUNITY)
Admission: EM | Admit: 2010-09-02 | Discharge: 2010-09-02 | Disposition: A | Discharge status: Home / Self Care | Payer: Self-pay | Attending: Emergency Medicine | Admitting: Emergency Medicine

## 2010-09-02 DIAGNOSIS — M5430 Sciatica, unspecified side: Secondary | ICD-10-CM | POA: Insufficient documentation

## 2010-09-02 DIAGNOSIS — G8929 Other chronic pain: Secondary | ICD-10-CM | POA: Insufficient documentation

## 2010-09-02 DIAGNOSIS — F17200 Nicotine dependence, unspecified, uncomplicated: Secondary | ICD-10-CM | POA: Insufficient documentation

## 2010-09-02 DIAGNOSIS — M549 Dorsalgia, unspecified: Secondary | ICD-10-CM | POA: Insufficient documentation

## 2010-09-02 DIAGNOSIS — Z8619 Personal history of other infectious and parasitic diseases: Secondary | ICD-10-CM | POA: Insufficient documentation

## 2010-09-09 NOTE — ED Provider Notes (Signed)
Medical screening examination/treatment/procedure(s) were performed by non-physician practitioner and as supervising physician I was immediately available for consultation/collaboration.   Benny Lennert, MD 09/09/10 1040

## 2010-09-10 ENCOUNTER — Inpatient Hospital Stay (INDEPENDENT_AMBULATORY_CARE_PROVIDER_SITE_OTHER)
Admission: RE | Admit: 2010-09-10 | Discharge: 2010-09-10 | Disposition: A | Discharge status: Home / Self Care | Payer: Self-pay | Source: Ambulatory Visit | Attending: Family Medicine | Admitting: Family Medicine

## 2010-09-10 DIAGNOSIS — IMO0002 Reserved for concepts with insufficient information to code with codable children: Secondary | ICD-10-CM

## 2010-09-13 ENCOUNTER — Emergency Department (HOSPITAL_COMMUNITY)
Admission: EM | Admit: 2010-09-13 | Discharge: 2010-09-13 | Discharge status: Against Medical Advice | Payer: Self-pay | Attending: Emergency Medicine | Admitting: Emergency Medicine

## 2010-09-28 NOTE — ED Notes (Signed)
Medical screening examination/treatment/procedure(s) were conducted as a shared visit with non-physician practitioner(s) and myself.  I personally evaluated the patient during the encounter.  Original IVC papers rescinded.  No s/h ideation.  Pt 's angry behavior has subsided;  Can f/u as out pt  Donnetta Hutching, MD 09/28/10 1700

## 2010-10-12 LAB — DIFFERENTIAL
Lymphocytes Relative: 29
Lymphs Abs: 1.7
Monocytes Relative: 6
Neutrophils Relative %: 63

## 2010-10-12 LAB — URINALYSIS, ROUTINE W REFLEX MICROSCOPIC
Glucose, UA: NEGATIVE
Hgb urine dipstick: NEGATIVE
Protein, ur: NEGATIVE
Specific Gravity, Urine: 1.012

## 2010-10-12 LAB — CBC
Hemoglobin: 15.3
MCV: 92.5
RBC: 4.95
WBC: 5.9

## 2010-10-12 LAB — BASIC METABOLIC PANEL
Calcium: 8.8
Chloride: 108
Creatinine, Ser: 0.92
GFR calc Af Amer: >60
Sodium: 141

## 2010-10-12 LAB — CK: Total CK: 177

## 2010-10-22 ENCOUNTER — Other Ambulatory Visit: Payer: Self-pay | Admitting: Internal Medicine

## 2010-10-22 DIAGNOSIS — M545 Low back pain: Secondary | ICD-10-CM

## 2010-11-04 ENCOUNTER — Encounter (HOSPITAL_COMMUNITY): Payer: Self-pay | Admitting: *Deleted

## 2010-11-04 ENCOUNTER — Emergency Department (HOSPITAL_COMMUNITY): Payer: Self-pay

## 2010-11-04 ENCOUNTER — Emergency Department (HOSPITAL_COMMUNITY)
Admission: EM | Admit: 2010-11-04 | Discharge: 2010-11-04 | Disposition: A | Discharge status: Home / Self Care | Payer: Self-pay | Attending: Emergency Medicine | Admitting: Emergency Medicine

## 2010-11-04 DIAGNOSIS — B1920 Unspecified viral hepatitis C without hepatic coma: Secondary | ICD-10-CM | POA: Insufficient documentation

## 2010-11-04 DIAGNOSIS — M25512 Pain in left shoulder: Secondary | ICD-10-CM

## 2010-11-04 DIAGNOSIS — F319 Bipolar disorder, unspecified: Secondary | ICD-10-CM | POA: Insufficient documentation

## 2010-11-04 DIAGNOSIS — F17200 Nicotine dependence, unspecified, uncomplicated: Secondary | ICD-10-CM | POA: Insufficient documentation

## 2010-11-04 DIAGNOSIS — F909 Attention-deficit hyperactivity disorder, unspecified type: Secondary | ICD-10-CM | POA: Insufficient documentation

## 2010-11-04 DIAGNOSIS — M25519 Pain in unspecified shoulder: Secondary | ICD-10-CM | POA: Insufficient documentation

## 2010-11-04 MED ORDER — CYCLOBENZAPRINE HCL 10 MG PO TABS: ORAL_TABLET | ORAL | Status: DC

## 2010-11-04 MED ORDER — OXYCODONE HCL 5 MG PO TABS: 5.0000 mg | ORAL_TABLET | Freq: Four times a day (QID) | ORAL | Status: AC | PRN
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
## 2010-11-06 NOTE — ED Provider Notes (Signed)
Medical screening examination/treatment/procedure(s) were performed by non-physician practitioner and as supervising physician I was immediately available for consultation/collaboration.   Rayvon Dakin L Robecca Fulgham, MD 11/06/10 0904 

## 2010-12-05 ENCOUNTER — Emergency Department (HOSPITAL_COMMUNITY)
Admission: EM | Admit: 2010-12-05 | Discharge: 2010-12-05 | Disposition: A | Discharge status: Home / Self Care | Payer: Self-pay | Attending: Emergency Medicine | Admitting: Emergency Medicine

## 2010-12-05 ENCOUNTER — Encounter (HOSPITAL_COMMUNITY): Payer: Self-pay | Admitting: *Deleted

## 2010-12-05 DIAGNOSIS — F319 Bipolar disorder, unspecified: Secondary | ICD-10-CM | POA: Insufficient documentation

## 2010-12-05 DIAGNOSIS — F17200 Nicotine dependence, unspecified, uncomplicated: Secondary | ICD-10-CM | POA: Insufficient documentation

## 2010-12-05 DIAGNOSIS — B1920 Unspecified viral hepatitis C without hepatic coma: Secondary | ICD-10-CM | POA: Insufficient documentation

## 2010-12-05 DIAGNOSIS — F909 Attention-deficit hyperactivity disorder, unspecified type: Secondary | ICD-10-CM | POA: Insufficient documentation

## 2010-12-05 DIAGNOSIS — M545 Low back pain, unspecified: Secondary | ICD-10-CM | POA: Insufficient documentation

## 2010-12-05 DIAGNOSIS — M519 Unspecified thoracic, thoracolumbar and lumbosacral intervertebral disc disorder: Secondary | ICD-10-CM | POA: Insufficient documentation

## 2010-12-05 DIAGNOSIS — X500XXA Overexertion from strenuous movement or load, initial encounter: Secondary | ICD-10-CM | POA: Insufficient documentation

## 2010-12-05 DIAGNOSIS — M549 Dorsalgia, unspecified: Secondary | ICD-10-CM

## 2010-12-05 MED ORDER — PREDNISONE 10 MG PO TABS: 20.0000 mg | ORAL_TABLET | Freq: Every day | ORAL | Status: AC

## 2010-12-05 MED ORDER — HYDROCODONE-ACETAMINOPHEN 5-325 MG PO TABS: 2.0000 | ORAL_TABLET | Freq: Four times a day (QID) | ORAL | Status: AC | PRN

## 2010-12-05 MED ORDER — HYDROCODONE-ACETAMINOPHEN 5-325 MG PO TABS
2.0000 | ORAL_TABLET | Freq: Once | ORAL | Status: AC
  Administered 2010-12-05: 2 via ORAL
  Filled 2010-12-05: qty 2

## 2010-12-05 MED ORDER — IBUPROFEN 800 MG PO TABS
800.0000 mg | ORAL_TABLET | Freq: Once | ORAL | Status: AC
  Administered 2010-12-05: 800 mg via ORAL
  Filled 2010-12-05: qty 1

## 2010-12-05 NOTE — ED Notes (Signed)
Pt a/ox4. Resp even and unlabored. NAD at this time. D/C instructions and Rx x2 reviewed with pt. Pt verbalized udnerstanding. Pt ambulated to lobby with steady gate.

## 2010-12-05 NOTE — ED Notes (Signed)
Pt c/o pain to lower back. Pt states he was carrying a ladder and caused pain to back.

## 2010-12-06 NOTE — ED Provider Notes (Signed)
History     CSN: [id number] Arrival date & time: 12/05/2010  1:56 PM   First MD Initiated Contact with Patient 12/05/10 1518      Chief Complaint  Patient presents with  . Back Pain    (Consider location/radiation/quality/duration/timing/severity/associated sxs/prior treatment) HPI Comments: Joshua Thomas is a 35 y.o. 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 As he took the ladder off his truck, the edge caught on the rack twisting the ladder and twisting his back. He is experiencing left sided lower back pain with radiation to the left leg. He is unable to find a comfortable position. He has taken tylenol with no relief. He denies numbness or tingling. He is having shrp stabbing pains with movement that radiate down his left leg.He has no PCP.  Patient is a 35 y.o. male presenting with back pain.  Back Pain     Past Medical History  Diagnosis Date  . Depression   . Bipolar 1 disorder   . ADHD (attention deficit hyperactivity disorder)   . Hepatitis C     History reviewed. No pertinent past surgical history.  Family History  Problem Relation Age of Onset  . Diabetes Mother     History  Substance Use Topics  . Smoking status: Current Everyday Smoker -- 1.0 packs/day for 24 years    Types: Cigarettes  . Smokeless tobacco: Former Neurosurgeon    Quit date: 08/08/2006  . Alcohol Use: Yes     occasionally liquor      Review of Systems  Musculoskeletal: Positive for back pain.  10 Systems reviewed and are negative for acute change except as noted in the HPI.  Allergies  Acetaminophen  Home Medications   Current Outpatient Rx  Name Route Sig Dispense Refill  . AMITRIPTYLINE HCL 10 MG PO TABS Oral Take by mouth at bedtime. Does not know dosage.     . AZITHROMYCIN 250 MG  PO TABS  One po QD until gone.  (initial dose given in the ED). 4 tablet 0  . VITAMIN B-12 PO Oral Take 1 capsule by mouth daily.      . CYCLOBENZAPRINE HCL 10 MG PO TABS  1/2 to one po TID 20 tablet 0  . GABAPENTIN 300 MG PO CAPS Oral Take 300 mg by mouth 3 (three) times daily.      Marland Kitchen HYDROCODONE-ACETAMINOPHEN 5-325 MG PO TABS Oral Take 2 tablets by mouth every 6 (six) hours as needed for pain. 30 tablet 0  . ICY HOT BACK EX Apply externally Apply 1 application topically daily as needed. For muscle aches and pain     . MILK THISTLE PO Oral Take 1 capsule by mouth daily.      Marland Kitchen MIRTAZAPINE 15 MG PO TABS Oral Take 15 mg by mouth at bedtime.      Marland Kitchen NICOTINE 21 MG/24HR TD PT24 Transdermal Place 1 patch onto the skin daily.      Marland Kitchen OMEPRAZOLE 20 MG PO CPDR Oral Take 20 mg by mouth daily.      Marland Kitchen PREDNISONE 10 MG PO TABS Oral Take 2 tablets (20 mg total) by mouth daily. 14 tablet 0  . QUETIAPINE FUMARATE 100 MG PO TABS Oral Take 100 mg by mouth 3 (three) times daily.      . QUETIAPINE FUMARATE 200  MG PO TABS Oral Take 200 mg by mouth 2 (two) times daily.        BP 132/80  Pulse 108  Temp(Src) 97.9 F (36.6 C) (Oral)  Resp 20  Ht 5\' 3"  (1.6 m)  Wt 160 lb (72.576 kg)  BMI 28.34 kg/m2  SpO2 100%  Physical Exam  Nursing note and vitals reviewed. Constitutional: He is oriented to person, place, and time. He appears well-developed and well-nourished. No distress.  HENT:  Head: Normocephalic and atraumatic.  Eyes: EOM are normal.  Neck: Normal range of motion.  Cardiovascular: Normal rate, normal heart sounds and intact distal pulses.   Pulmonary/Chest: Effort normal and breath sounds normal.  Musculoskeletal:       No spinal tenderness to percussion. Mild lumbar spinal muscle tenderness on the left with palpation. Positive left leg raise. Sensation to light touch intact. Pulses 2+. Gait normal.  Neurological: He is alert and oriented to person, place, and time. He has normal reflexes.  Skin:  Skin is warm and dry.    ED Course  Procedures (including critical care time)  Labs Reviewed - No data to display No results found.   1. Back pain       MDM  Patient with know LS pain and disc disease injured his back again yesterday. Given analgesics.Pt stable in ED with no significant deterioration in condition.The patient appears reasonably screened and/or stabilized for discharge and I doubt any other medical condition or other [facility] requiring further screening, evaluation, or treatment in the ED at this time prior to discharge.  MDM Reviewed: previous chart, nursing note and vitals Reviewed previous: MRI and x-ray           Nicoletta Dress. Colon Branch, MD 12/06/10 1113

## 2010-12-16 ENCOUNTER — Ambulatory Visit
Admission: RE | Admit: 2010-12-16 | Discharge: 2010-12-16 | Disposition: A | Discharge status: Home / Self Care | Payer: No Typology Code available for payment source | Source: Ambulatory Visit | Attending: Internal Medicine | Admitting: Internal Medicine

## 2010-12-16 DIAGNOSIS — M545 Low back pain, unspecified: Secondary | ICD-10-CM

## 2010-12-16 MED ORDER — IOHEXOL 180 MG/ML  SOLN
1.0000 mL | Freq: Once | INTRAMUSCULAR | Status: AC | PRN
  Administered 2010-12-16: 1 mL via EPIDURAL

## 2010-12-16 MED ORDER — METHYLPREDNISOLONE ACETATE 40 MG/ML INJ SUSP (RADIOLOG
120.0000 mg | Freq: Once | INTRAMUSCULAR | Status: AC
  Administered 2010-12-16: 120 mg via EPIDURAL

## 2010-12-16 NOTE — Patient Instructions (Signed)

## 2011-01-02 ENCOUNTER — Emergency Department (HOSPITAL_COMMUNITY): Payer: Self-pay

## 2011-01-02 ENCOUNTER — Encounter (HOSPITAL_COMMUNITY): Payer: Self-pay | Admitting: *Deleted

## 2011-01-02 ENCOUNTER — Emergency Department (HOSPITAL_COMMUNITY)
Admission: EM | Admit: 2011-01-02 | Discharge: 2011-01-02 | Disposition: A | Discharge status: Home / Self Care | Payer: Self-pay | Attending: Emergency Medicine | Admitting: Emergency Medicine

## 2011-01-02 DIAGNOSIS — J069 Acute upper respiratory infection, unspecified: Secondary | ICD-10-CM | POA: Insufficient documentation

## 2011-01-02 DIAGNOSIS — H6692 Otitis media, unspecified, left ear: Secondary | ICD-10-CM

## 2011-01-02 DIAGNOSIS — B1920 Unspecified viral hepatitis C without hepatic coma: Secondary | ICD-10-CM | POA: Insufficient documentation

## 2011-01-02 DIAGNOSIS — R1011 Right upper quadrant pain: Secondary | ICD-10-CM | POA: Insufficient documentation

## 2011-01-02 DIAGNOSIS — F17200 Nicotine dependence, unspecified, uncomplicated: Secondary | ICD-10-CM | POA: Insufficient documentation

## 2011-01-02 DIAGNOSIS — R112 Nausea with vomiting, unspecified: Secondary | ICD-10-CM | POA: Insufficient documentation

## 2011-01-02 DIAGNOSIS — F319 Bipolar disorder, unspecified: Secondary | ICD-10-CM | POA: Insufficient documentation

## 2011-01-02 DIAGNOSIS — H6690 Otitis media, unspecified, unspecified ear: Secondary | ICD-10-CM | POA: Insufficient documentation

## 2011-01-02 DIAGNOSIS — R109 Unspecified abdominal pain: Secondary | ICD-10-CM | POA: Insufficient documentation

## 2011-01-02 DIAGNOSIS — Z87442 Personal history of urinary calculi: Secondary | ICD-10-CM | POA: Insufficient documentation

## 2011-01-02 DIAGNOSIS — F909 Attention-deficit hyperactivity disorder, unspecified type: Secondary | ICD-10-CM | POA: Insufficient documentation

## 2011-01-02 LAB — DIFFERENTIAL
Eosinophils Relative: 1 % (ref 0–5)
Lymphocytes Relative: 24 % (ref 12–46)
Lymphs Abs: 2 10*3/uL (ref 0.7–4.0)
Monocytes Absolute: 0.7 10*3/uL (ref 0.1–1.0)

## 2011-01-02 LAB — COMPREHENSIVE METABOLIC PANEL
CO2: 28 mEq/L (ref 19–32)
Calcium: 9.8 mg/dL (ref 8.4–10.5)
Creatinine, Ser: 0.84 mg/dL (ref 0.50–1.35)
GFR calc Af Amer: >90 mL/min (ref >90)
GFR calc non Af Amer: >90 mL/min (ref >90)
Glucose, Bld: 117 mg/dL — ABNORMAL HIGH (ref 70–99)

## 2011-01-02 LAB — URINALYSIS, ROUTINE W REFLEX MICROSCOPIC
Leukocytes, UA: NEGATIVE
Protein, ur: NEGATIVE mg/dL
Urobilinogen, UA: 0.2 mg/dL (ref 0.0–1.0)

## 2011-01-02 LAB — CBC
HCT: 48.9 % (ref 39.0–52.0)
MCH: 31.7 pg (ref 26.0–34.0)
MCV: 93.5 fL (ref 78.0–100.0)
RBC: 5.23 MIL/uL (ref 4.22–5.81)
RDW: 12.8 % (ref 11.5–15.5)
WBC: 8.6 10*3/uL (ref 4.0–10.5)

## 2011-01-02 LAB — URINE MICROSCOPIC-ADD ON

## 2011-01-02 LAB — PROTIME-INR: Prothrombin Time: 13.1 seconds (ref 11.6–15.2)

## 2011-01-02 LAB — LIPASE, BLOOD: Lipase: 25 U/L (ref 11–59)

## 2011-01-02 MED ORDER — SODIUM CHLORIDE 0.9 % IV SOLN: 999.0000 mL | INTRAVENOUS | Status: DC

## 2011-01-02 MED ORDER — DIPHENHYDRAMINE HCL 50 MG/ML IJ SOLN
25.0000 mg | Freq: Once | INTRAMUSCULAR | Status: AC
  Administered 2011-01-02: 25 mg via INTRAVENOUS
  Filled 2011-01-02: qty 1

## 2011-01-02 MED ORDER — OXYCODONE HCL 5 MG PO TABS: 10.0000 mg | ORAL_TABLET | ORAL | Status: AC | PRN

## 2011-01-02 MED ORDER — IOHEXOL 300 MG/ML  SOLN
100.0000 mL | Freq: Once | INTRAMUSCULAR | Status: AC | PRN
  Administered 2011-01-02: 100 mL via INTRAVENOUS

## 2011-01-02 MED ORDER — PROMETHAZINE HCL 25 MG PO TABS: 25.0000 mg | ORAL_TABLET | Freq: Four times a day (QID) | ORAL | Status: AC | PRN

## 2011-01-02 MED ORDER — AMOXICILLIN 500 MG PO CAPS: 500.0000 mg | ORAL_CAPSULE | Freq: Three times a day (TID) | ORAL | Status: AC

## 2011-01-02 MED ORDER — AMOXICILLIN 500 MG PO CAPS: 500.0000 mg | ORAL_CAPSULE | Freq: Three times a day (TID) | ORAL | Status: DC

## 2011-01-02 MED ORDER — HYDROMORPHONE HCL PF 1 MG/ML IJ SOLN
2.0000 mg | Freq: Once | INTRAMUSCULAR | Status: AC
  Administered 2011-01-02: 2 mg via INTRAVENOUS
  Filled 2011-01-02: qty 2

## 2011-01-02 MED ORDER — ONDANSETRON HCL 4 MG/2ML IJ SOLN
4.0000 mg | Freq: Once | INTRAMUSCULAR | Status: AC
  Administered 2011-01-02: 4 mg via INTRAVENOUS
  Filled 2011-01-02: qty 2

## 2011-01-02 NOTE — ED Provider Notes (Signed)
This chart was scribed for Felisa Bonier, MD by Wallis Mart. The patient was seen in room APA09/APA09 and the patient's care was started at 4:40 PM.   CSN: [id number]  Arrival date & time 01/02/11  1422   First MD Initiated Contact with Patient 01/02/11 1602      Chief Complaint  Patient presents with  . Abdominal Pain    (Consider location/radiation/quality/duration/timing/severity/associated sxs/prior treatment) HPI 6:37 PM  Joshua Thomas is a 35 y.o. male who presents to the Emergency Department complaining of gradual onset, intermittent abdominal pain that began 1 week ago.  Pain is located under the right side of the rib cage that radiates to the back. Pt says the pain comes every 5 minutes and lasts 5 seconds.  Pt descirbes the pain as dull.  Nothing worsens or relieves the pain.  Pt has h/o kidney stones.  Pt c/o associated emesis (every time he eats) and recent URI.  Pt denies fever, diarrhea, bloody or black stool, but his stool is looser than normal.   Pt denies prior abd surgeries. Pt has h/o back problems.     Past Medical History  Diagnosis Date  . Depression   . Bipolar 1 disorder   . ADHD (attention deficit hyperactivity disorder)   . Hepatitis C     History reviewed. No pertinent past surgical history.  Family History  Problem Relation Age of Onset  . Diabetes Mother     History  Substance Use Topics  . Smoking status: Current Everyday Smoker -- 1.0 packs/day for 24 years    Types: Cigarettes  . Smokeless tobacco: Former Neurosurgeon    Quit date: 08/08/2006  . Alcohol Use: Yes     occasionally liquor   His last bowel movement was about 3 hours ago and normal without blood or mucous.  He reports left ear pain and congestion as well.   Review of Systems 10 Systems reviewed and are negative for acute change except as noted in the HPI.  Allergies  Acetaminophen and Morphine and related  Home Medications   Current Outpatient Rx  Name Route Sig  Dispense Refill  . VITAMIN B-12 PO Oral Take 1 capsule by mouth daily.      Marland Kitchen GABAPENTIN 300 MG PO CAPS Oral Take 300 mg by mouth 3 (three) times daily.      Marland Kitchen MILK THISTLE PO Oral Take 1 capsule by mouth daily.      Marland Kitchen OMEPRAZOLE 20 MG PO CPDR Oral Take 20 mg by mouth daily.      . QUETIAPINE FUMARATE 200 MG PO TABS Oral Take 200 mg by mouth 2 (two) times daily.      . AMOXICILLIN 500 MG PO CAPS Oral Take 1 capsule (500 mg total) by mouth 3 (three) times daily. 30 capsule 0    BP 115/81  Pulse 88  Temp(Src) 98.6 F (37 C) (Oral)  Resp 20  Ht 5\' 3"  (1.6 m)  Wt 160 lb (72.576 kg)  BMI 28.34 kg/m2  SpO2 100%  Physical Exam  Nursing note and vitals reviewed. Constitutional: He is oriented to person, place, and time. He appears well-developed and well-nourished. No distress.  HENT:  Head: Normocephalic and atraumatic.  Mouth/Throat: Oropharynx is clear and moist.       Erythema to left TM Normal right TM  Eyes: EOM are normal. Pupils are equal, round, and reactive to light. No scleral icterus.  Neck: Normal range of motion. Neck supple. No tracheal deviation present.  Cardiovascular: Normal rate.   Pulmonary/Chest: Effort normal and breath sounds normal. No respiratory distress. He has no wheezes. He has no rales.  Abdominal: Soft. Bowel sounds are normal. He exhibits no distension.       Some RUQ tenderness No CVA tenderness  Musculoskeletal: Normal range of motion. He exhibits no edema.  Neurological: He is alert and oriented to person, place, and time. No sensory deficit.  Skin: Skin is warm and dry.  Psychiatric: He has a normal mood and affect. His behavior is normal.    ED Course  Procedures (including critical care time) DIAGNOSTIC STUDIES: Oxygen Saturation is 100% on room air, normal by my interpretation.    COORDINATION OF CARE:    Results for orders placed during the hospital encounter of 01/02/11  CBC      Component Value Range   WBC 8.6  4.0 - 10.5 (K/uL)     RBC 5.23  4.22 - 5.81 (MIL/uL)   Hemoglobin 16.6  13.0 - 17.0 (g/dL)   HCT 16.1  09.6 - 04.5 (%)   MCV 93.5  78.0 - 100.0 (fL)   MCH 31.7  26.0 - 34.0 (pg)   MCHC 33.9  30.0 - 36.0 (g/dL)   RDW 40.9  81.1 - 91.4 (%)   Platelets 254  150 - 400 (K/uL)  DIFFERENTIAL      Component Value Range   Neutrophils Relative 67  43 - 77 (%)   Neutro Abs 5.8  1.7 - 7.7 (K/uL)   Lymphocytes Relative 24  12 - 46 (%)   Lymphs Abs 2.0  0.7 - 4.0 (K/uL)   Monocytes Relative 8  3 - 12 (%)   Monocytes Absolute 0.7  0.1 - 1.0 (K/uL)   Eosinophils Relative 1  0 - 5 (%)   Eosinophils Absolute 0.1  0.0 - 0.7 (K/uL)   Basophils Relative 0  0 - 1 (%)   Basophils Absolute 0.0  0.0 - 0.1 (K/uL)  COMPREHENSIVE METABOLIC PANEL      Component Value Range   Sodium 135  135 - 145 (mEq/L)   Potassium 3.5  3.5 - 5.1 (mEq/L)   Chloride 99  96 - 112 (mEq/L)   CO2 28  19 - 32 (mEq/L)   Glucose, Bld 117 (*) 70 - 99 (mg/dL)   BUN 13  6 - 23 (mg/dL)   Creatinine, Ser 7.82  0.50 - 1.35 (mg/dL)   Calcium 9.8  8.4 - 95.6 (mg/dL)   Total Protein 7.7  6.0 - 8.3 (g/dL)   Albumin 3.8  3.5 - 5.2 (g/dL)   AST 20  0 - 37 (U/L)   ALT 52  0 - 53 (U/L)   Alkaline Phosphatase 59  39 - 117 (U/L)   Total Bilirubin 0.4  0.3 - 1.2 (mg/dL)   GFR calc non Af Amer >90  >90 (mL/min)   GFR calc Af Amer >90  >90 (mL/min)  LIPASE, BLOOD      Component Value Range   Lipase 25  11 - 59 (U/L)  URINALYSIS, ROUTINE W REFLEX MICROSCOPIC      Component Value Range   Color, Urine YELLOW  YELLOW    APPearance CLEAR  CLEAR    Specific Gravity, Urine 1.020  1.005 - 1.030    pH 6.5  5.0 - 8.0    Glucose, UA NEGATIVE  NEGATIVE (mg/dL)   Hgb urine dipstick TRACE (*) NEGATIVE    Bilirubin Urine NEGATIVE  NEGATIVE    Ketones, ur NEGATIVE  NEGATIVE (mg/dL)   Protein, ur NEGATIVE  NEGATIVE (mg/dL)   Urobilinogen, UA 0.2  0.0 - 1.0 (mg/dL)   Nitrite NEGATIVE  NEGATIVE    Leukocytes, UA NEGATIVE  NEGATIVE   PROTIME-INR      Component Value  Range   Prothrombin Time 13.1  11.6 - 15.2 (seconds)   INR 0.97  0.00 - 1.49   APTT      Component Value Range   aPTT 30  24 - 37 (seconds)  AMMONIA      Component Value Range   Ammonia 19  11 - 60 (umol/L)  URINE MICROSCOPIC-ADD ON      Component Value Range   WBC, UA 3-6  <3 (WBC/hpf)   RBC / HPF 3-6  <3 (RBC/hpf)   Bacteria, UA RARE  RARE    Dg Epidurography  12/16/2010  *RADIOLOGY REPORT*  Clinical Data: Lumbosacral spondylosis without myelopathy.  Low back pain.  Left L5-S1 and disc protrusion with foraminal stenosis. Left L4-L5 disc protrusion.  Radicular pain extending to the lateral aspect of the left foot.  LUMBAR INTERLAMINAR EPIDURAL INJECTION  Procedure: After a thorough discussion of risks and benefits of the procedure, written and verbal consent was obtained.  Specific risks included puncture of the thecal sac and dura as well as nontherapeutic injection with general risks of bleeding, infection, injury to nerves, blood vessels, and adjacent structures.  Verbal consent was obtained by Dr. Carlota Raspberry. An interlaminar approach was performed at L5-S1 on the left.  We discussed the moderate likelihood of moderate lasting relief/attinment of therapuetic goal.   The overlying skin was cleansed with betadine soap and anesthetized with 1% lidocaine without epinephrine.  3-1/2 inches 20 gauge needle was advanced using loss-of-resistance technique.  DIAGNOSTIC/THERAPUETIC EPIDURAL INJECTION:  Injection of Omnipaque 180 shows a good epidural pattern with spread above and below the level of needle placement, primarily on the side of needle placement.  No vascular or subarachnoid  opacification was  seen. 120 mg of Depo-Medrol mixed with 5 cc of 1% Lidocaine were instilled.  The procedure was well-tolerated, and the patient was discharged thirty minutes following the injection in good condition.  Fluoroscopy Time:  16 seconds  IMPRESSION:  Technically successful first lumbar interlaminar epidural  injection at left L5-S1.  Original Report Authenticated By: Andreas Newport, M.D.      Ct Abdomen Pelvis W Contrast  01/02/2011  *RADIOLOGY REPORT*  Clinical Data: Right upper quadrant flank pain.  CT ABDOMEN AND PELVIS WITH CONTRAST  Technique:  Multidetector CT imaging of the abdomen and pelvis was performed following the standard protocol during bolus administration of intravenous contrast.  Contrast: OMNIPAQUE IOHEXOL 300 MG/ML IV SOLN  Comparison: 08/08/2010  Findings: Lung bases are clear.  No effusions.  Heart is normal size.  Liver, gallbladder, spleen, pancreas, adrenals and kidneys are normal.  No hydronephrosis.  No renal or ureteral stones.  Appendix is visualized and is normal. Bowel grossly unremarkable. No free fluid, free air, or adenopathy.  Aorta is normal caliber.  No acute bony abnormality.  IMPRESSION: No acute findings.  Original Report Authenticated By: Cyndie Chime, M.D.   6:38 PM Patient's pain has been controlled at this time he is having no further vomiting or nausea and symptoms have been controlled in the ED. His laboratory studies reveal no significant abnormality and actually no indication of hepatitis or acute cholecystitis or pancreatitis or renal dysfunction. He has no apparent urinary tract infection in no apparent pancreatitis. His CT scan  of the abdomen and pelvis is unrevealing showing no ureteric calculus, and no cholecystitis, in no acute findings to explain his symptoms. The patient will be discharged home with symptomatic treatment of pain and nausea, as well as treatment for otitis media. His pain may be due to musculoskeletal pain and/or nerve impingement at the upper lumbar lower thoracic spine as he has chronic back pain issues. No need for further evaluation or treatment in the ED.  1. Otitis media, left   2. Abdominal  pain, other specified site   3. Nausea & vomiting   4. Viral upper respiratory tract infection with cough       MDM  Viral  UR tract Infection with left otitis media but no pharyngitis or pneumonia. The abd pain and nausea could be due to Gastritis, peptic ulcer disease, biliary colic, cholelithiasis, cholecystitis, cholangitis, hepatitis, renal colic, urinary tract infection, colitis, constipation, gastroenteritis all considered among other etiologies in the patient's differential diagnosis. The pt will need a CT scan of the abd to evaluate for cholecystitis or kidney stone   I personally performed the services described in this documentation, which was scribed in my presence. The recorded information has been reviewed and considered.   Felisa Bonier, MD 01/02/11 [id number]

## 2011-01-02 NOTE — ED Notes (Signed)
Pt c/o cough, congestion, back pain and abdominal pain x 1 week. Denies fever, nausea, vomiting and diarrhea.

## 2011-01-02 NOTE — ED Notes (Signed)
Patient states he may not be able to give much of an urine sample because he is severely dehydrated and needs something to drink.

## 2011-01-02 NOTE — ED Notes (Signed)
Patient was advised that a small urine sample is needed.

## 2011-02-01 ENCOUNTER — Emergency Department: Payer: Self-pay | Admitting: Emergency Medicine

## 2011-08-30 ENCOUNTER — Emergency Department (HOSPITAL_COMMUNITY)
Admission: EM | Admit: 2011-08-30 | Discharge: 2011-08-30 | Disposition: A | Discharge status: Home / Self Care | Payer: Self-pay | Attending: Emergency Medicine | Admitting: Emergency Medicine

## 2011-08-30 ENCOUNTER — Emergency Department (HOSPITAL_COMMUNITY): Payer: Self-pay

## 2011-08-30 ENCOUNTER — Encounter (HOSPITAL_COMMUNITY): Payer: Self-pay | Admitting: *Deleted

## 2011-08-30 DIAGNOSIS — F17200 Nicotine dependence, unspecified, uncomplicated: Secondary | ICD-10-CM | POA: Insufficient documentation

## 2011-08-30 DIAGNOSIS — M79605 Pain in left leg: Secondary | ICD-10-CM

## 2011-08-30 DIAGNOSIS — Z885 Allergy status to narcotic agent status: Secondary | ICD-10-CM | POA: Insufficient documentation

## 2011-08-30 DIAGNOSIS — B1920 Unspecified viral hepatitis C without hepatic coma: Secondary | ICD-10-CM | POA: Insufficient documentation

## 2011-08-30 DIAGNOSIS — F909 Attention-deficit hyperactivity disorder, unspecified type: Secondary | ICD-10-CM | POA: Insufficient documentation

## 2011-08-30 DIAGNOSIS — Z833 Family history of diabetes mellitus: Secondary | ICD-10-CM | POA: Insufficient documentation

## 2011-08-30 DIAGNOSIS — M79609 Pain in unspecified limb: Secondary | ICD-10-CM | POA: Insufficient documentation

## 2011-08-30 DIAGNOSIS — F319 Bipolar disorder, unspecified: Secondary | ICD-10-CM | POA: Insufficient documentation

## 2011-08-30 MED ORDER — HYDROMORPHONE HCL PF 2 MG/ML IJ SOLN
2.0000 mg | Freq: Once | INTRAMUSCULAR | Status: AC
  Administered 2011-08-30: 2 mg via INTRAMUSCULAR
  Filled 2011-08-30: qty 1

## 2011-08-30 MED ORDER — HYDROMORPHONE HCL 4 MG PO TABS: 4.0000 mg | ORAL_TABLET | ORAL | Status: AC | PRN

## 2011-08-30 NOTE — ED Provider Notes (Signed)
History  This chart was scribed for Donnetta Hutching, MD by Erskine Emery. This patient was seen in room APA07/APA07 and the patient's care was started at 18:36.   CSN: [id number]  Arrival date & time 08/30/11  1804   First MD Initiated Contact with Patient 08/30/11 1836      Chief Complaint  Patient presents with  . Leg Pain    (Consider location/radiation/quality/duration/timing/severity/associated sxs/prior treatment) The history is provided by the patient. No language interpreter was used.  Joshua Thomas is a 36 y.o. male who presents to the Emergency Department complaining of aggravated lower left leg pain. Pt reports he fell off some scaffolding about 30-40 feet on May 21st, shattering the bones in his lower left leg. Pt was [place] in [place] after the accident. Pt reports he tried doing some work again today and it aggravated the pain.   Past Medical History  Diagnosis Date  . Depression   . Bipolar 1 disorder   . ADHD (attention deficit hyperactivity disorder)   . Hepatitis C     Past Surgical History  Procedure Date  . Fracture surgery     Family History  Problem Relation Age of Onset  . Diabetes Mother     History  Substance Use Topics  . Smoking status: Current Everyday Smoker -- 1.0 packs/day for 24 years    Types: Cigarettes  . Smokeless tobacco: Former Neurosurgeon    Quit date: 08/08/2006  . Alcohol Use: Yes     occasionally liquor      Review of Systems  Constitutional: Negative for fever and chills.  Respiratory: Negative for shortness of breath.   Gastrointestinal: Negative for nausea and vomiting.  Musculoskeletal:       Left lower leg pain  Neurological: Negative for weakness.  All other systems reviewed and are negative.    Allergies  Acetaminophen and Morphine and related  Home Medications   Current Outpatient Rx  Name Route Sig Dispense Refill  . VITAMIN B-12 PO Oral Take 1 capsule by mouth daily.      Marland Kitchen GABAPENTIN 300 MG PO CAPS Oral  Take 300 mg by mouth 3 (three) times daily.      Marland Kitchen MILK THISTLE PO Oral Take 1 capsule by mouth daily.      Marland Kitchen OMEPRAZOLE 20 MG PO CPDR Oral Take 20 mg by mouth daily.      . QUETIAPINE FUMARATE 200 MG PO TABS Oral Take 200 mg by mouth 2 (two) times daily.      . SEROQUEL 100 MG PO TABS Oral Take 1 tablet (100 mg total) by mouth 3 (three) times daily - between meals and at bedtime. 90 tablet 0    Dispense as written.    Triage Vitals: BP 149/109  Pulse 128  Temp 98.4 F (36.9 C) (Oral)  Resp 18  Ht 5\' 4"  (1.626 m)  Wt 165 lb (74.844 kg)  BMI 28.32 kg/m2  SpO2 100%  Physical Exam  Nursing note and vitals reviewed. Constitutional: He is oriented to person, place, and time. He appears well-developed and well-nourished.  HENT:  Head: Normocephalic and atraumatic.  Eyes: Conjunctivae and EOM are normal.  Neck: Normal range of motion. Neck supple.  Cardiovascular: Normal rate.   Pulmonary/Chest: Effort normal.  Abdominal: Soft.  Musculoskeletal: Normal range of motion.       Tender from the knee to the ankle in the left leg.  Neurological: He is alert and oriented to person, place, and time.  Skin: Skin is warm and dry.  Psychiatric: He has a normal mood and affect.    ED Course  Procedures (including critical care time) DIAGNOSTIC STUDIES: Oxygen Saturation is 100% on room air, normal by my interpretation.    COORDINATION OF CARE: 19:10--I evaluated the patient and we discussed a treatment plan including pain medication injection and x-rays to which the pt agreed.    Labs Reviewed - No data to display No results found.   No diagnosis found.  Dg Tibia/fibula Left  08/30/2011  *RADIOLOGY REPORT*  Clinical Data: Right leg pain.  Fracture 3 months ago  LEFT TIBIA AND FIBULA - 2 VIEW  Comparison: None.  Findings: Fracture of the distal tibia has been fixed with a locking intramedullary rod.  There is partial healing of the fracture but there is not yet bony union.  There is  partial healing fractures of the proximal and distal fibula.  No prior studies are available however no acute fracture is identified.  IMPRESSION: Healing fractures of the tibia and fibula.  No acute fracture is identified.  Hardware in satisfactory position.   Original Report Authenticated By: Camelia Phenes, M.D.     MDM  X-ray of left tibia and fibula negative. Rx dilaudid  Po as outpatient      I personally performed the services described in this documentation, which was scribed in my presence. The recorded information has been reviewed and considered.    Donnetta Hutching, MD 08/30/11 2119

## 2011-08-30 NOTE — ED Notes (Signed)
Pain lt lower leg, Had mult fx to lower leg 3mos ago, Today tried to work and had increase in pain.

## 2011-08-30 NOTE — ED Notes (Signed)
Ambulated with difficulty to bathroom

## 2011-09-04 ENCOUNTER — Encounter (HOSPITAL_COMMUNITY): Payer: Self-pay

## 2011-09-04 ENCOUNTER — Emergency Department (HOSPITAL_COMMUNITY)
Admission: EM | Admit: 2011-09-04 | Discharge: 2011-09-04 | Disposition: A | Discharge status: Home / Self Care | Payer: Self-pay | Attending: Emergency Medicine | Admitting: Emergency Medicine

## 2011-09-04 DIAGNOSIS — Z8619 Personal history of other infectious and parasitic diseases: Secondary | ICD-10-CM | POA: Insufficient documentation

## 2011-09-04 DIAGNOSIS — M79609 Pain in unspecified limb: Secondary | ICD-10-CM | POA: Insufficient documentation

## 2011-09-04 DIAGNOSIS — F319 Bipolar disorder, unspecified: Secondary | ICD-10-CM | POA: Insufficient documentation

## 2011-09-04 DIAGNOSIS — Z79899 Other long term (current) drug therapy: Secondary | ICD-10-CM | POA: Insufficient documentation

## 2011-09-04 DIAGNOSIS — Z8781 Personal history of (healed) traumatic fracture: Secondary | ICD-10-CM | POA: Insufficient documentation

## 2011-09-04 DIAGNOSIS — M79606 Pain in leg, unspecified: Secondary | ICD-10-CM

## 2011-09-04 DIAGNOSIS — F909 Attention-deficit hyperactivity disorder, unspecified type: Secondary | ICD-10-CM | POA: Insufficient documentation

## 2011-09-04 HISTORY — DX: Other chronic pain: G89.29

## 2011-09-04 MED ORDER — CYCLOBENZAPRINE HCL 10 MG PO TABS: 10.0000 mg | ORAL_TABLET | Freq: Two times a day (BID) | ORAL | Status: AC | PRN

## 2011-09-04 MED ORDER — IBUPROFEN 800 MG PO TABS: 800.0000 mg | ORAL_TABLET | Freq: Once | ORAL | Status: DC

## 2011-09-04 NOTE — ED Notes (Signed)
Pt stated that he was given dilaudid 4mg  for pain several days ago, and it is to strong for him, the 2mg  tablets work well for him.

## 2011-09-04 NOTE — ED Provider Notes (Signed)
History  This chart was scribed for Joshua Quarry, MD by Shari Heritage. The patient was seen in room APA08/APA08. Patient's care was started at 1523.     CSN: [id number]  Arrival date & time 09/04/11  1523   First MD Initiated Contact with Patient 09/04/11 1533      Chief Complaint  Patient presents with  . Drug Overdose     Patient is a 36 y.o. male presenting with leg pain. The history is provided by the patient. No language interpreter was used.  Leg Pain  The incident occurred more than 1 week ago. The incident occurred at work. The injury mechanism was a fall. The pain is present in the left leg. The pain is severe. He reports no foreign bodies present.   Joshua Thomas is a 36 y.o. male with who presents to the Emergency Department requesting complaining of severe lower, left leg. Patient also says that the Dilaudid 4 mg pills prescribed for him during his last visit are too strong and that he needs different pain medication. Patient was seen by  Dr. Donnetta Hutching on 08/30/11 complaining of of the same. He had a left tibia X-Koury Roddy during this visit which was negative. Patient was discharged with a prescription for Dilaudid. Patient states that he filled the prescription on 08/31/11 and has taken 16 pills of Dilaulid 4 mg. He says that they make him feel "loopy." Patient says that he had a fracture surgery 3 months ago at [facility]. Patient's original injury was caused by a fall during work. He does not have insurance and says that he hasn't been able to afford the necessary rehab for his leg following surgery. He has a medical history of depression, bipolar 1 disorder, ADHD and hepatitis C. He is a current everyday smoker.    Past Medical History  Diagnosis Date  . Depression   . Bipolar 1 disorder   . ADHD (attention deficit hyperactivity disorder)   . Hepatitis C   . Chronic pain     Past Surgical History  Procedure Date  . Fracture surgery     Family History  Problem Relation Age  of Onset  . Diabetes Mother     History  Substance Use Topics  . Smoking status: Current Everyday Smoker -- 1.0 packs/day for 24 years    Types: Cigarettes  . Smokeless tobacco: Former Neurosurgeon    Quit date: 08/08/2006  . Alcohol Use: Yes     occasionally liquor      Review of Systems  Musculoskeletal:       Positive for leg pain.  All other systems reviewed and are negative.    Allergies  Acetaminophen and Morphine and related  Home Medications   Current Outpatient Rx  Name Route Sig Dispense Refill  . GOODY HEADACHE PO Oral Take 2 packets by mouth 3 (three) times daily as needed.    Marland Kitchen VITAMIN B-12 PO Oral Take 1 capsule by mouth daily.      Marland Kitchen HYDROMORPHONE HCL 4 MG PO TABS Oral Take 1 tablet (4 mg total) by mouth every 4 (four) hours as needed for pain. 25 tablet 0    BP 127/87  Pulse 108  Temp 98.1 F (36.7 C) (Oral)  Resp 20  Ht 5\' 4"  (1.626 m)  Wt 165 lb (74.844 kg)  BMI 28.32 kg/m2  SpO2 100%  Physical Exam  Constitutional: He is oriented to person, place, and time. He appears well-developed and well-nourished.  HENT:  Head:  Normocephalic and atraumatic.  Musculoskeletal:       Left lower leg: He exhibits tenderness.       Tenderness to left mid lower extremity on anterior aspect. No signs of deformity. No swelling or edema. Dorsalis pedis 2+ in left foot. Sensation intact. Strength is equal. Bilateral toe and ankle flexion/extension is normal.  Neurological: He is alert and oriented to person, place, and time.  Skin: Skin is warm and dry.  Psychiatric: He has a normal mood and affect. His behavior is normal.    ED Course  Procedures (including critical care time) DIAGNOSTIC STUDIES: Oxygen Saturation is 100% on room air, normal by my interpretation.    COORDINATION OF CARE: 3:35pm- Patient informed of current plan for treatment and evaluation and agrees with plan at this time.      Labs Reviewed - No data to display  No results found.   No  diagnosis found.         I personally performed the services described in this documentation, which was scribed in my presence. The recorded information has been reviewed and considered.   Joshua Quarry, MD 09/04/11 2217

## 2011-09-21 ENCOUNTER — Emergency Department (HOSPITAL_COMMUNITY)
Admission: EM | Admit: 2011-09-21 | Discharge: 2011-09-21 | Disposition: A | Discharge status: Home / Self Care | Payer: Self-pay

## 2011-09-21 NOTE — ED Notes (Signed)
Went to call pt for triage, was advised by registration that pt had left.

## 2011-09-22 ENCOUNTER — Encounter (HOSPITAL_COMMUNITY): Payer: Self-pay

## 2011-09-22 ENCOUNTER — Emergency Department (HOSPITAL_COMMUNITY)
Admission: EM | Admit: 2011-09-22 | Discharge: 2011-09-22 | Disposition: A | Discharge status: Home / Self Care | Payer: Self-pay | Attending: Emergency Medicine | Admitting: Emergency Medicine

## 2011-09-22 DIAGNOSIS — F319 Bipolar disorder, unspecified: Secondary | ICD-10-CM | POA: Insufficient documentation

## 2011-09-22 DIAGNOSIS — F909 Attention-deficit hyperactivity disorder, unspecified type: Secondary | ICD-10-CM | POA: Insufficient documentation

## 2011-09-22 DIAGNOSIS — M79605 Pain in left leg: Secondary | ICD-10-CM

## 2011-09-22 DIAGNOSIS — B1920 Unspecified viral hepatitis C without hepatic coma: Secondary | ICD-10-CM | POA: Insufficient documentation

## 2011-09-22 DIAGNOSIS — F17200 Nicotine dependence, unspecified, uncomplicated: Secondary | ICD-10-CM | POA: Insufficient documentation

## 2011-09-22 DIAGNOSIS — G8929 Other chronic pain: Secondary | ICD-10-CM | POA: Insufficient documentation

## 2011-09-22 DIAGNOSIS — M79609 Pain in unspecified limb: Secondary | ICD-10-CM | POA: Insufficient documentation

## 2011-09-22 NOTE — ED Notes (Signed)
Pt c/o right leg pain that never stops but has gotten worse.

## 2011-09-22 NOTE — ED Notes (Signed)
Discharge instructions reviewed with pt, questions answered. Pt verbalized understanding.  

## 2011-09-22 NOTE — ED Provider Notes (Addendum)
History     CSN: [id number]  Arrival date & time 09/22/11  1631   First MD Initiated Contact with Patient 09/22/11 1645      Chief Complaint  Patient presents with  . Leg Pain    (Consider location/radiation/quality/duration/timing/severity/associated sxs/prior treatment) Patient is a 36 y.o. male presenting with leg pain. The history is provided by the patient.  Leg Pain  The incident occurred more than 1 week ago. Injury mechanism: Pt had ORIF of the right leg at [facility]. He has had pain and problem with the leg since. No PCP at this time. The pain is present in the right leg. The quality of the pain is described as sharp. The pain is severe. The pain has been intermittent since onset. Associated symptoms include inability to bear weight. Pertinent negatives include no loss of sensation. He reports no foreign bodies present. Treatments tried: goody powders.    Past Medical History  Diagnosis Date  . Depression   . Bipolar 1 disorder   . ADHD (attention deficit hyperactivity disorder)   . Hepatitis C   . Chronic pain     Past Surgical History  Procedure Date  . Fracture surgery     Family History  Problem Relation Age of Onset  . Diabetes Mother     History  Substance Use Topics  . Smoking status: Current Every Day Smoker -- 1.0 packs/day for 24 years    Types: Cigarettes  . Smokeless tobacco: Former Neurosurgeon    Quit date: 08/08/2006  . Alcohol Use: Yes     occasionally liquor      Review of Systems  Musculoskeletal: Positive for back pain and arthralgias.  Psychiatric/Behavioral:       Depression    Allergies  Acetaminophen and Morphine and related  Home Medications   Current Outpatient Rx  Name Route Sig Dispense Refill  . GOODY HEADACHE PO Oral Take 2 packets by mouth 3 (three) times daily as needed.    Marland Kitchen VITAMIN B-12 PO Oral Take 1 capsule by mouth daily.      Marland Kitchen MIRTAZAPINE 15 MG PO TABS Oral Take 15 mg by mouth at bedtime.    Marland Kitchen QUETIAPINE  FUMARATE 100 MG PO TABS Oral Take 100 mg by mouth at bedtime.    Marland Kitchen MIRTAZAPINE 15 MG PO TABS Oral Take 1 tablet (15 mg total) by mouth at bedtime. 30 tablet 0    BP 157/101  Pulse 92  Temp 98.2 F (36.8 C) (Oral)  Resp 16  Ht 5\' 4"  (1.626 m)  Wt 163 lb (73.936 kg)  BMI 27.98 kg/m2  SpO2 99%  Physical Exam  Nursing note and vitals reviewed. Constitutional: He is oriented to person, place, and time. He appears well-developed and well-nourished.  Non-toxic appearance.  HENT:  Head: Normocephalic.  Right Ear: Tympanic membrane and external ear normal.  Left Ear: Tympanic membrane and external ear normal.  Eyes: EOM and lids are normal. Pupils are equal, round, and reactive to light.  Neck: Normal range of motion. Neck supple. Carotid bruit is not present.  Cardiovascular: Normal rate, regular rhythm, normal heart sounds, intact distal pulses and normal pulses.   Pulmonary/Chest: Breath sounds normal. No respiratory distress.  Abdominal: Soft. Bowel sounds are normal. There is no tenderness. There is no guarding.  Musculoskeletal: Normal range of motion.       There is good ROM of the left hip, knee, ankle and toes. Cap refill is less than 3 sec. Well healed surgical  scars present. No hot area. No effusions. No red streaks. Neg Homan's sign. Distal pulses 2+.   Lymphadenopathy:       Head (right side): No submandibular adenopathy present.       Head (left side): No submandibular adenopathy present.    He has no cervical adenopathy.  Neurological: He is alert and oriented to person, place, and time. He has normal strength. No cranial nerve deficit or sensory deficit.  Skin: Skin is warm and dry.  Psychiatric: He has a normal mood and affect. His speech is normal.    ED Course  Procedures (including critical care time)  Labs Reviewed - No data to display No results found.   No diagnosis found.    MDM  I have reviewed nursing notes, vital signs, and all appropriate lab and  imaging results for this patient. Pt had major left lower leg surg at the [facility] over 1 month ago. Pt continues to have pain with walking on a cane and with rest. He is concerned if the area is healing correctly, and why the pain. Advised pt of non-acute findings on todays exam. Advised pt to have this conversation with his surgeon for complete answers.       Kathie Dike, PA 09/22/11 1708  Kathie Dike, [place] 11/12/11 [phone]

## 2011-09-22 NOTE — ED Notes (Signed)
Discharge instructions reviewed with pt, questions answered. Pt verbalized understanding. Pt refused to sign.Pt stated "I can't believe y'all won't give me any pain medicine".

## 2011-09-23 NOTE — ED Provider Notes (Signed)
Medical screening examination/treatment/procedure(s) were performed by non-physician practitioner and as supervising physician I was immediately available for consultation/collaboration.   Dione Booze, MD 09/23/11 [phone]

## 2011-09-30 ENCOUNTER — Emergency Department (HOSPITAL_COMMUNITY)
Admission: EM | Admit: 2011-09-30 | Discharge: 2011-09-30 | Disposition: A | Discharge status: Home / Self Care | Payer: Self-pay | Attending: Emergency Medicine | Admitting: Emergency Medicine

## 2011-09-30 ENCOUNTER — Encounter (HOSPITAL_COMMUNITY): Payer: Self-pay

## 2011-09-30 ENCOUNTER — Emergency Department (HOSPITAL_COMMUNITY): Payer: Self-pay

## 2011-09-30 DIAGNOSIS — F17200 Nicotine dependence, unspecified, uncomplicated: Secondary | ICD-10-CM | POA: Insufficient documentation

## 2011-09-30 DIAGNOSIS — F319 Bipolar disorder, unspecified: Secondary | ICD-10-CM | POA: Insufficient documentation

## 2011-09-30 DIAGNOSIS — Z79899 Other long term (current) drug therapy: Secondary | ICD-10-CM | POA: Insufficient documentation

## 2011-09-30 DIAGNOSIS — G8929 Other chronic pain: Secondary | ICD-10-CM | POA: Insufficient documentation

## 2011-09-30 DIAGNOSIS — F909 Attention-deficit hyperactivity disorder, unspecified type: Secondary | ICD-10-CM | POA: Insufficient documentation

## 2011-09-30 DIAGNOSIS — M79609 Pain in unspecified limb: Secondary | ICD-10-CM | POA: Insufficient documentation

## 2011-09-30 HISTORY — DX: Other chronic pain: G89.29

## 2011-09-30 HISTORY — DX: Sciatica, unspecified side: M54.30

## 2011-09-30 HISTORY — DX: Dorsalgia, unspecified: M54.9

## 2011-09-30 MED ORDER — OXYCODONE HCL 10 MG PO TB12: 10.0000 mg | ORAL_TABLET | Freq: Two times a day (BID) | ORAL | Status: DC | PRN

## 2011-09-30 MED ORDER — HYDROMORPHONE HCL PF 1 MG/ML IJ SOLN
1.0000 mg | Freq: Once | INTRAMUSCULAR | Status: AC
  Administered 2011-09-30: 1 mg via INTRAMUSCULAR
  Filled 2011-09-30: qty 1

## 2011-09-30 NOTE — ED Notes (Signed)
Hx of lower leg fracture in the past and has not been able to see ortho surgeon as instructed, has appt later this month

## 2011-09-30 NOTE — ED Notes (Signed)
Pt states he slipped on wet floor tonight and landed on his left side, states is having pain to left lower leg.

## 2011-09-30 NOTE — ED Provider Notes (Signed)
History     CSN: [id number]  Arrival date & time 09/30/11  0043   First MD Initiated Contact with Patient 09/30/11 0124      Chief Complaint  Patient presents with  . Leg Pain     HPI Pt was seen at 0135.  Per pt, c/o gradual onset and persistence of constant acute flair of his chronic LLE "pain" that began after he "slipped on some ice on the floor at home" PTA.  Describes the pain as per his usual longstanding chronic pain pattern, "dull pain," and "like a toothache."  Pt also states he has run out of his pain meds, is not due to see Ortho MD in [place] until Monday (4 days from now), and is requesting pain meds refill (oxycodone).  The symptoms have been associated with no other complaints. The patient has a significant history of similar symptoms previously, recently being evaluated for this complaint and multiple prior evals for same.  Denies focal motor weakness, no tingling/numbness in extremity, no fevers, no rash.        Past Medical History  Diagnosis Date  . Depression   . Bipolar 1 disorder   . ADHD (attention deficit hyperactivity disorder)   . Hepatitis C   . Chronic pain   . Chronic leg pain   . Chronic back pain     Past Surgical History  Procedure Date  . Fracture surgery     Family History  Problem Relation Age of Onset  . Diabetes Mother     History  Substance Use Topics  . Smoking status: Current Every Day Smoker -- 1.0 packs/day for 24 years    Types: Cigarettes  . Smokeless tobacco: Former Neurosurgeon    Quit date: 08/08/2006  . Alcohol Use: Yes     occasionally liquor      Review of Systems ROS: Statement: All systems negative except as marked or noted in the HPI; Constitutional: Negative for fever and chills. ; ; Eyes: Negative for eye pain, redness and discharge. ; ; ENMT: Negative for ear pain, hoarseness, nasal congestion, sinus pressure and sore throat. ; ; Cardiovascular: Negative for chest pain, palpitations, diaphoresis, dyspnea and  peripheral edema. ; ; Respiratory: Negative for cough, wheezing and stridor. ; ; Gastrointestinal: Negative for nausea, vomiting, diarrhea, abdominal pain, blood in stool, hematemesis, jaundice and rectal bleeding. . ; ; Genitourinary: Negative for dysuria, flank pain and hematuria. ; ; Musculoskeletal: +LLE pain. Negative for back pain and neck pain. Negative for swelling and trauma.; ; Skin: Negative for pruritus, rash, abrasions, blisters, bruising and skin lesion.; ; Neuro: Negative for headache, lightheadedness and neck stiffness. Negative for weakness, altered level of consciousness , altered mental status, extremity weakness, paresthesias, involuntary movement, seizure and syncope.       Allergies  Acetaminophen and Morphine and related  Home Medications   Current Outpatient Rx  Name Route Sig Dispense Refill  . VITAMIN B-12 PO Oral Take 1 capsule by mouth daily.      Marland Kitchen MIRTAZAPINE 15 MG PO TABS Oral Take 15 mg by mouth at bedtime.    Marland Kitchen QUETIAPINE FUMARATE 100 MG PO TABS Oral Take 100 mg by mouth at bedtime.    Marlin Canary HEADACHE PO Oral Take 2 packets by mouth 3 (three) times daily as needed.    Marland Kitchen MIRTAZAPINE 15 MG PO TABS Oral Take 1 tablet (15 mg total) by mouth at bedtime. 30 tablet 0    BP 134/91  Pulse 126  Temp 98.3 F (36.8 C) (Oral)  Resp 16  SpO2 96%  Physical Exam 0140: Physical examination:  Nursing notes reviewed; Vital signs and O2 SAT reviewed;  Constitutional: Well developed, Well nourished, Well hydrated, In no acute distress; Head:  Normocephalic, atraumatic; Eyes: EOMI, PERRL, No scleral icterus; ENMT: Mouth and pharynx normal, Mucous membranes moist; Neck: Supple, Full range of motion, No lymphadenopathy; Cardiovascular: Regular rate and rhythm, No murmur, rub, or gallop; Respiratory: Breath sounds clear & equal bilaterally, No rales, rhonchi, wheezes.  Speaking full sentences with ease, Normal respiratory effort/excursion; Chest: Nontender, Movement normal;  Extremities: Pulses normal, No deformity, no erythema, no rash, no ecchymosis. No edema, No calf edema or asymmetry.; Neuro: AA&Ox3, Major CN grossly intact.  Speech clear. No gross focal motor or sensory deficits in extremities.; Skin: Color normal, Warm, Dry.   ED Course  Procedures    MDM  MDM Reviewed: nursing note, vitals and previous chart Reviewed previous: x-ray Interpretation: x-ray     Dg Tibia/fibula Left 09/30/2011  *RADIOLOGY REPORT*  Clinical Data: Status post fall. History of fracture 3 months ago.  LEFT TIBIA AND FIBULA - 2 VIEW  Comparison: Plain films 09/26/2011.  Findings: IM nail fixing a distal tibial fracture is again seen. Distal fibular fracture also again noted.  There is some callus formation about the patient's fractures although fracture lines remain visible.  Callus formation appears somewhat increased since the prior study.  No new abnormality is identified.  IMPRESSION: Healing tibial and fibular fractures.  No acute abnormality.   Original Report Authenticated By: Bernadene Bell. D'ALESSIO, M.D.      0300:  Long hx of chronic pain with multiple ED visits for same.  Pt endorses acute flair of his usual long standing chronic pain today, no change from his usual chronic pain pattern. Pt is requesting "oxycodone" for pain. Pt encouraged to f/u with his Ortho MD, PMD and Pain Management doctor for good continuity of care and control of his chronic pain.  Encouraged to keep his appt in [place] on Monday.  Verb understanding.     Laray Anger, DO 10/03/11 1434

## 2011-10-04 ENCOUNTER — Emergency Department (HOSPITAL_COMMUNITY)
Admission: EM | Admit: 2011-10-04 | Discharge: 2011-10-04 | Disposition: A | Discharge status: Home / Self Care | Payer: Self-pay | Attending: Emergency Medicine | Admitting: Emergency Medicine

## 2011-10-04 ENCOUNTER — Encounter (HOSPITAL_COMMUNITY): Payer: Self-pay | Admitting: *Deleted

## 2011-10-04 DIAGNOSIS — F909 Attention-deficit hyperactivity disorder, unspecified type: Secondary | ICD-10-CM | POA: Insufficient documentation

## 2011-10-04 DIAGNOSIS — M79609 Pain in unspecified limb: Secondary | ICD-10-CM | POA: Insufficient documentation

## 2011-10-04 DIAGNOSIS — B1920 Unspecified viral hepatitis C without hepatic coma: Secondary | ICD-10-CM | POA: Insufficient documentation

## 2011-10-04 DIAGNOSIS — F17200 Nicotine dependence, unspecified, uncomplicated: Secondary | ICD-10-CM | POA: Insufficient documentation

## 2011-10-04 DIAGNOSIS — F319 Bipolar disorder, unspecified: Secondary | ICD-10-CM | POA: Insufficient documentation

## 2011-10-04 DIAGNOSIS — G8929 Other chronic pain: Secondary | ICD-10-CM | POA: Insufficient documentation

## 2011-10-04 MED ORDER — HYDROMORPHONE HCL PF 1 MG/ML IJ SOLN
1.0000 mg | Freq: Once | INTRAMUSCULAR | Status: AC
  Administered 2011-10-04: 1 mg via INTRAMUSCULAR
  Filled 2011-10-04: qty 1

## 2011-10-04 MED ORDER — OXYCODONE HCL 5 MG PO TABS: ORAL_TABLET | ORAL | Status: DC

## 2011-10-04 MED ORDER — OXYCODONE-ACETAMINOPHEN 10-325 MG PO TABS: 1.0000 | ORAL_TABLET | Freq: Four times a day (QID) | ORAL | Status: DC | PRN

## 2011-10-04 NOTE — ED Provider Notes (Signed)
History     CSN: [id number]  Arrival date & time 10/04/11  3664   First MD Initiated Contact with Patient 10/04/11 1940      Chief Complaint  Patient presents with  . Leg Pain    (Consider location/radiation/quality/duration/timing/severity/associated sxs/prior treatment) HPI Comments: Patient with hx of chronic left lower leg c/o increased pain to his leg for several days.  States that he ran out of his pain medication recently and has to work this week in order to save enough money for his orthopedic appt.  Denies new injury, redness, swelling , extremity weakness or numbness   Past Medical History  Diagnosis Date  . Depression   . Bipolar 1 disorder   . ADHD (attention deficit hyperactivity disorder)   . Hepatitis C   . Chronic pain   . Chronic leg pain   . Chronic back pain   . Sciatica     Past Surgical History  Procedure Date  . Fracture surgery     Family History  Problem Relation Age of Onset  . Diabetes Mother     History  Substance Use Topics  . Smoking status: Current Every Day Smoker -- 1.0 packs/day for 24 years    Types: Cigarettes  . Smokeless tobacco: Former Neurosurgeon    Quit date: 08/08/2006  . Alcohol Use: Yes     occasionally liquor      Review of Systems  Constitutional: Negative for fever and chills.  Genitourinary: Negative for dysuria and difficulty urinating.  Musculoskeletal: Positive for arthralgias. Negative for joint swelling.  Skin: Negative for color change and wound.  Neurological: Negative for weakness and numbness.  All other systems reviewed and are negative.    Allergies  Acetaminophen and Morphine and related  Home Medications   Current Outpatient Rx  Name Route Sig Dispense Refill  . GOODY HEADACHE PO Oral Take 2 packets by mouth 3 (three) times daily as needed.    Marland Kitchen VITAMIN B-12 PO Oral Take 1 capsule by mouth daily.      Marland Kitchen MIRTAZAPINE 15 MG PO TABS Oral Take 1 tablet (15 mg total) by mouth at bedtime. 30 tablet 0   . MIRTAZAPINE 15 MG PO TABS Oral Take 15 mg by mouth at bedtime.    . OXYCODONE HCL ER 10 MG PO TB12 Oral Take 1 tablet (10 mg total) by mouth every 12 (twelve) hours as needed for pain. 8 tablet 0  . QUETIAPINE FUMARATE 100 MG PO TABS Oral Take 100 mg by mouth at bedtime.      BP 137/106  Pulse 121  Temp 98.6 F (37 C) (Oral)  Resp 20  Ht 5\' 3"  (1.6 m)  Wt 165 lb (74.844 kg)  BMI 29.23 kg/m2  SpO2 100%  Physical Exam  Nursing note and vitals reviewed. Constitutional: He is oriented to person, place, and time. He appears well-developed and well-nourished. No distress.  Cardiovascular: Normal rate, regular rhythm, normal heart sounds and intact distal pulses.   Pulmonary/Chest: Effort normal and breath sounds normal.  Musculoskeletal: He exhibits tenderness. He exhibits no edema.       Left lower leg: He exhibits tenderness and bony tenderness. He exhibits no swelling, no edema, no deformity and no laceration.       Legs:      ttp of the left left leg.  No erythema or edema.  No  bruising or deformity. DP pulse is brisk, sensation intact.  No calf pain, negative Homans' sign   Neurological:  He is alert and oriented to person, place, and time. He exhibits normal muscle tone. Coordination normal.  Skin: Skin is warm and dry. No erythema.    ED Course  Procedures (including critical care time)  Labs Reviewed - No data to display No results found.      MDM   Previous ed charts and imaging reviewed.    Hx of chronic left LE pain secondary to trauma/orthopedic surgery.  Multiple ED visits for this.  Pt stating that he ran out of pain medication and is having to work in order to save enough money for the orthopedic visit.  Doubtful of infectious process.  No edema.  Distal sensation intact,.  CR< 3 sec.  DP and PT pulses are brisk.     The patient appears reasonably screened and/or stabilized for discharge and I doubt any other medical condition or other [facility] requiring further  screening, evaluation, or treatment in the ED at this time prior to discharge.   Prescribed: Percocet # 15  Pt was orginally written for 5 mg oxycodone but he returned the rx stating that none of the local pharmacies have it in stock so percocet was written instead at patient's request  Meron Bocchino L. [place], [place] 10/06/11 1738

## 2011-10-04 NOTE — ED Notes (Addendum)
Fx to lt lower leg in May, had surgery for same.  Here for pain control.  Chronic back pain,

## 2011-10-06 NOTE — ED Provider Notes (Signed)
Medical screening examination/treatment/procedure(s) were performed by non-physician practitioner and as supervising physician I was immediately available for consultation/collaboration.   Kaislee Chao W. Bird Swetz, MD 10/06/11 2048 

## 2011-10-09 ENCOUNTER — Emergency Department (HOSPITAL_COMMUNITY)
Admission: EM | Admit: 2011-10-09 | Discharge: 2011-10-09 | Disposition: A | Discharge status: Home / Self Care | Payer: Self-pay | Attending: Emergency Medicine | Admitting: Emergency Medicine

## 2011-10-09 ENCOUNTER — Emergency Department (HOSPITAL_COMMUNITY): Payer: Self-pay

## 2011-10-09 ENCOUNTER — Encounter (HOSPITAL_COMMUNITY): Payer: Self-pay

## 2011-10-09 DIAGNOSIS — Z8619 Personal history of other infectious and parasitic diseases: Secondary | ICD-10-CM | POA: Insufficient documentation

## 2011-10-09 DIAGNOSIS — S8012XA Contusion of left lower leg, initial encounter: Secondary | ICD-10-CM

## 2011-10-09 DIAGNOSIS — F909 Attention-deficit hyperactivity disorder, unspecified type: Secondary | ICD-10-CM | POA: Insufficient documentation

## 2011-10-09 DIAGNOSIS — F319 Bipolar disorder, unspecified: Secondary | ICD-10-CM | POA: Insufficient documentation

## 2011-10-09 DIAGNOSIS — G8929 Other chronic pain: Secondary | ICD-10-CM | POA: Insufficient documentation

## 2011-10-09 DIAGNOSIS — S61209A Unspecified open wound of unspecified finger without damage to nail, initial encounter: Secondary | ICD-10-CM | POA: Insufficient documentation

## 2011-10-09 DIAGNOSIS — IMO0002 Reserved for concepts with insufficient information to code with codable children: Secondary | ICD-10-CM | POA: Insufficient documentation

## 2011-10-09 DIAGNOSIS — M79609 Pain in unspecified limb: Secondary | ICD-10-CM | POA: Insufficient documentation

## 2011-10-09 DIAGNOSIS — S61011A Laceration without foreign body of right thumb without damage to nail, initial encounter: Secondary | ICD-10-CM

## 2011-10-09 DIAGNOSIS — Z23 Encounter for immunization: Secondary | ICD-10-CM | POA: Insufficient documentation

## 2011-10-09 DIAGNOSIS — Y92009 Unspecified place in unspecified non-institutional (private) residence as the place of occurrence of the external cause: Secondary | ICD-10-CM | POA: Insufficient documentation

## 2011-10-09 MED ORDER — HYDROMORPHONE HCL PF 1 MG/ML IJ SOLN
1.0000 mg | Freq: Once | INTRAMUSCULAR | Status: AC
  Administered 2011-10-09: 1 mg via INTRAMUSCULAR
  Filled 2011-10-09: qty 1

## 2011-10-09 MED ORDER — OXYCODONE HCL 5 MG PO TABS: 5.0000 mg | ORAL_TABLET | Freq: Three times a day (TID) | ORAL | Status: DC | PRN

## 2011-10-09 MED ORDER — LIDOCAINE HCL (PF) 1 % IJ SOLN
INTRAMUSCULAR | Status: AC
  Administered 2011-10-09: 06:00:00
  Filled 2011-10-09: qty 5

## 2011-10-09 MED ORDER — BACITRACIN-NEOMYCIN-POLYMYXIN 400-5-5000 EX OINT
TOPICAL_OINTMENT | CUTANEOUS | Status: AC
  Administered 2011-10-09: 07:00:00
  Filled 2011-10-09: qty 1

## 2011-10-09 MED ORDER — TETANUS-DIPHTH-ACELL PERTUSSIS 5-2.5-18.5 LF-MCG/0.5 IM SUSP
0.5000 mL | Freq: Once | INTRAMUSCULAR | Status: AC
  Administered 2011-10-09: 0.5 mL via INTRAMUSCULAR
  Filled 2011-10-09: qty 0.5

## 2011-10-09 NOTE — ED Notes (Signed)
Wound cleaned and dressed, discharged home.  Pt very angry that he was only given 5 Oxy IR tablets. Explained to pt that he was recently prescribed pain medication on last visit, and that if taken as directed, he should still have some.

## 2011-10-09 NOTE — ED Provider Notes (Signed)
History     CSN: [id number]  Arrival date & time 10/09/11  4098   First MD Initiated Contact with Patient 10/09/11 [phone]      Chief Complaint  Patient presents with  . Extremity Laceration  . Leg Pain    Patient is a 36 y.o. male presenting with leg pain. The history is provided by the patient.  Leg Pain  Incident onset: just prior to arrival. The incident occurred at home. The injury mechanism was a fall. The pain is present in the left leg. The pain is severe. The pain has been constant since onset. Pertinent negatives include no muscle weakness. The symptoms are aggravated by activity. He has tried rest for the symptoms. The treatment provided no relief.  PT reports he was getting out of bed tonight, and he tripped over fan.  He thinks he reinjured his left LE.  He reports h/o injury to leg in the past.  He also reports that he sustained a laceration to his right thumb in the fall.  No head injury.  No LOC  Past Medical History  Diagnosis Date  . Depression   . Bipolar 1 disorder   . ADHD (attention deficit hyperactivity disorder)   . Hepatitis C   . Chronic pain   . Chronic leg pain   . Chronic back pain   . Sciatica     Past Surgical History  Procedure Date  . Fracture surgery     Family History  Problem Relation Age of Onset  . Diabetes Mother     History  Substance Use Topics  . Smoking status: Current Every Day Smoker -- 1.0 packs/day for 24 years    Types: Cigarettes  . Smokeless tobacco: Former Neurosurgeon    Quit date: 08/08/2006  . Alcohol Use: Yes     occasionally liquor      Review of Systems  Constitutional: Negative for fever.  Skin: Positive for wound.  Neurological: Negative for weakness.    Allergies  Acetaminophen and Morphine and related  Home Medications   Current Outpatient Rx  Name Route Sig Dispense Refill  . GOODY HEADACHE PO Oral Take 2 packets by mouth 3 (three) times daily as needed.    Marland Kitchen VITAMIN B-12 PO Oral Take 1 capsule by  mouth daily.      Marland Kitchen MIRTAZAPINE 15 MG PO TABS Oral Take 15 mg by mouth at bedtime.    Marland Kitchen QUETIAPINE FUMARATE 100 MG PO TABS Oral Take 100 mg by mouth at bedtime.    Marland Kitchen MIRTAZAPINE 15 MG PO TABS Oral Take 1 tablet (15 mg total) by mouth at bedtime. 30 tablet 0  . OXYCODONE HCL ER 10 MG PO TB12 Oral Take 1 tablet (10 mg total) by mouth every 12 (twelve) hours as needed for pain. 8 tablet 0  . OXYCODONE-ACETAMINOPHEN 10-325 MG PO TABS Oral Take 1 tablet by mouth every 6 (six) hours as needed for pain. 15 tablet 0    BP 111/76  Pulse 118  Temp 98.2 F (36.8 C) (Oral)  Resp 20  Ht 5\' 3"  (1.6 m)  Wt 165 lb (74.844 kg)  BMI 29.23 kg/m2  SpO2 98%  Physical Exam CONSTITUTIONAL: Well developed/well nourished HEAD AND FACE: Normocephalic/atraumatic EYES: EOMI/PERRL ENMT: Mucous membranes moist NECK: supple no meningeal signs SPINE:entire spine nontender CV: S1/S2 noted, no murmurs/rubs/gallops noted LUNGS: Lungs are clear to auscultation bilaterally, no apparent distress ABDOMEN: soft, nontender, no rebound or guarding GU:no cva tenderness NEURO: Pt is awake/alert, moves  all extremitiesx4 EXTREMITIES: pulses normal, full ROM.  Tenderness to distal aspect of left LE, no open skin or bruising.  No ankle or knee tenderness.  He can range the left hip without difficulty.   Laceration noted to right thumb on distal aspect. He has full ROM of right thumb, no bone or tendon exposed.  No foreign body noted SKIN: warm, color normal PSYCH: no abnormalities of mood noted  ED Course  Procedures  LACERATION REPAIR Performed by: Joya Gaskins Consent: Verbal consent obtained. Risks and benefits: risks, benefits and alternatives were discussed Patient identity confirmed: provided demographic data Time out performed prior to procedure Prepped and Draped in normal sterile fashion Wound explored Laceration Location: right thumb Laceration Length: 2cm No Foreign Bodies seen or  palpated Anesthesia: local infiltration Local anesthetic: lidocaine 1%  Anesthetic total: 3 ml Irrigation method: syringe Amount of cleaning: standard Skin closure: simple Number of sutures or staples: 5 Technique: simple interrupted Patient tolerance: Patient tolerated the procedure well with no immediate complications.   Pt well appearing Will advise to start using crutches.  F/u with ortho.  MDM  Nursing notes including past medical history and social history reviewed and considered in documentation xrays reviewed and considered         Joya Gaskins, MD 10/09/11 [phone]

## 2011-10-09 NOTE — ED Notes (Signed)
Pt states he tripped over a fan tonight and hit his left lower leg, states severe pain to same.   Pt also has cuts to thumb and index finger, states he cut it on the fan when he fell

## 2011-10-09 NOTE — ED Notes (Signed)
Pt has lac to right thumb from a fall where he hit against a metal fan.    Pt also has pain to left lower leg where he had previous surgery to same a year ago, feels he hit right on the same area where he has a rod and screws placed.

## 2011-10-09 NOTE — ED Notes (Signed)
Dt wickline in room suturing pts finger

## 2011-10-11 ENCOUNTER — Encounter (HOSPITAL_COMMUNITY): Payer: Self-pay | Admitting: *Deleted

## 2011-10-11 ENCOUNTER — Emergency Department (HOSPITAL_COMMUNITY)
Admission: EM | Admit: 2011-10-11 | Discharge: 2011-10-11 | Disposition: A | Discharge status: Home / Self Care | Payer: Self-pay | Attending: Emergency Medicine | Admitting: Emergency Medicine

## 2011-10-11 DIAGNOSIS — G8929 Other chronic pain: Secondary | ICD-10-CM

## 2011-10-11 DIAGNOSIS — Z8619 Personal history of other infectious and parasitic diseases: Secondary | ICD-10-CM | POA: Insufficient documentation

## 2011-10-11 DIAGNOSIS — F17200 Nicotine dependence, unspecified, uncomplicated: Secondary | ICD-10-CM | POA: Insufficient documentation

## 2011-10-11 DIAGNOSIS — F319 Bipolar disorder, unspecified: Secondary | ICD-10-CM | POA: Insufficient documentation

## 2011-10-11 DIAGNOSIS — Z9181 History of falling: Secondary | ICD-10-CM | POA: Insufficient documentation

## 2011-10-11 DIAGNOSIS — M79609 Pain in unspecified limb: Secondary | ICD-10-CM | POA: Insufficient documentation

## 2011-10-11 DIAGNOSIS — F909 Attention-deficit hyperactivity disorder, unspecified type: Secondary | ICD-10-CM | POA: Insufficient documentation

## 2011-10-11 MED ORDER — TRAMADOL HCL 50 MG PO TABS: 50.0000 mg | ORAL_TABLET | Freq: Four times a day (QID) | ORAL | Status: DC | PRN

## 2011-10-11 MED ORDER — IBUPROFEN 800 MG PO TABS: 800.0000 mg | ORAL_TABLET | Freq: Three times a day (TID) | ORAL | Status: DC

## 2011-10-11 NOTE — ED Notes (Addendum)
Pain lt lower leg, has chronic pain, Says he was seen by Dr Hilda Lias today. And referred to [facility] ortho.  Has sutures in rt thumb

## 2011-10-11 NOTE — ED Notes (Signed)
Pt reports left lower leg pain for months, was seen by keeling today

## 2011-10-14 NOTE — ED Provider Notes (Signed)
History     CSN: [id number]  Arrival date & time 10/11/11  1536   First MD Initiated Contact with Patient 10/11/11 1635      Chief Complaint  Patient presents with  . Leg Pain    (Consider location/radiation/quality/duration/timing/severity/associated sxs/prior treatment) HPI Comments: CHARLS GROWNEY presents with chronic pain in his left lower leg since he fell off a roof last spring,  Requiring internal fixation with a rod through his tibia, his surgery occuring in [place].  He has nonunion of the bones currently and reports constant pain which is worse since falling 2 days ago.  He was seen here immediately after the fall and  xrays revealed no new injury.  He tried to get an appointment with Dr. Hilda Lias today and was told that he would be better served by either [place] [facility] or [facility] [facility] for this type of chronic old injury.  In the interim,  He has worse pain and has run out of the oxycodone he was prescribed 2 days ago which he states did not help the pain.  Pain is constant and does not radiate.  He has found no alleviators.  He also has sutures in right thumb from fall 2 days ago, he has no complaint regarding this injury.  The history is provided by the patient.    Past Medical History  Diagnosis Date  . Depression   . Bipolar 1 disorder   . ADHD (attention deficit hyperactivity disorder)   . Hepatitis C   . Chronic pain   . Chronic leg pain   . Chronic back pain   . Sciatica     Past Surgical History  Procedure Date  . Fracture surgery     Family History  Problem Relation Age of Onset  . Diabetes Mother     History  Substance Use Topics  . Smoking status: Current Every Day Smoker -- 1.0 packs/day for 24 years    Types: Cigarettes  . Smokeless tobacco: Former Neurosurgeon    Quit date: 08/08/2006  . Alcohol Use: Yes     occasionally liquor      Review of Systems  Musculoskeletal: Positive for arthralgias.  Skin: Positive for wound.  Neurological:  Negative for weakness and numbness.    Allergies  Acetaminophen and Morphine and related  Home Medications   Current Outpatient Rx  Name Route Sig Dispense Refill  . GOODY HEADACHE PO Oral Take 2 packets by mouth 3 (three) times daily as needed. For pain    . VITAMIN B-12 PO Oral Take 1 capsule by mouth daily.      . QUETIAPINE FUMARATE 100 MG PO TABS Oral Take 100 mg by mouth at bedtime.    . IBUPROFEN 800 MG PO TABS Oral Take 1 tablet (800 mg total) by mouth 3 (three) times daily. 21 tablet 0  . OXYCODONE HCL 5 MG PO TABS Oral Take 1 tablet (5 mg total) by mouth every 8 (eight) hours as needed for pain. 5 tablet 0  . TRAMADOL HCL 50 MG PO TABS Oral Take 1 tablet (50 mg total) by mouth every 6 (six) hours as needed for pain. 15 tablet 0    BP 123/94  Pulse 120  Temp 98.7 F (37.1 C) (Oral)  Resp 18  Ht 5\' 5"  (1.651 m)  Wt 165 lb (74.844 kg)  BMI 27.46 kg/m2  SpO2 100%  Physical Exam  Constitutional: He appears well-developed and well-nourished.  HENT:  Head: Atraumatic.  Neck: Normal range  of motion.  Cardiovascular:       Pulses equal bilaterally  Musculoskeletal: He exhibits tenderness.       TTP left anterior lower extremity with firm deformity noted at site of previous fracture.  No open wounds.  FROM of knee, ankle and hip without discomfort.  Neurological: He is alert. He has normal strength. He displays normal reflexes. No sensory deficit.       Equal strength  Skin: Skin is warm and dry.       Well healing lacertion right thumb with no sign of infection  Psychiatric: He has a normal mood and affect.    ED Course  Procedures (including critical care time)  Labs Reviewed - No data to display No results found.   1. Chronic leg pain       MDM  Pt prescribed ultram, ibuprofen.  Encouraged to establish care with ortho per Dr. Jenetta Downer recommendation.        Burgess Amor, [place] 10/14/11 1849

## 2011-10-17 NOTE — ED Provider Notes (Signed)
Medical screening examination/treatment/procedure(s) were performed by non-physician practitioner and as supervising physician I was immediately available for consultation/collaboration.    Shelda Jakes, MD 10/17/11 2214

## 2011-10-23 ENCOUNTER — Encounter (HOSPITAL_COMMUNITY): Payer: Self-pay | Admitting: *Deleted

## 2011-10-23 ENCOUNTER — Emergency Department (HOSPITAL_COMMUNITY)
Admission: EM | Admit: 2011-10-23 | Discharge: 2011-10-23 | Disposition: A | Discharge status: Home / Self Care | Payer: Self-pay | Attending: Emergency Medicine | Admitting: Emergency Medicine

## 2011-10-23 DIAGNOSIS — F3130 Bipolar disorder, current episode depressed, mild or moderate severity, unspecified: Secondary | ICD-10-CM | POA: Insufficient documentation

## 2011-10-23 DIAGNOSIS — F909 Attention-deficit hyperactivity disorder, unspecified type: Secondary | ICD-10-CM | POA: Insufficient documentation

## 2011-10-23 DIAGNOSIS — Z8619 Personal history of other infectious and parasitic diseases: Secondary | ICD-10-CM | POA: Insufficient documentation

## 2011-10-23 DIAGNOSIS — M549 Dorsalgia, unspecified: Secondary | ICD-10-CM | POA: Insufficient documentation

## 2011-10-23 DIAGNOSIS — M79609 Pain in unspecified limb: Secondary | ICD-10-CM | POA: Insufficient documentation

## 2011-10-23 DIAGNOSIS — F17200 Nicotine dependence, unspecified, uncomplicated: Secondary | ICD-10-CM | POA: Insufficient documentation

## 2011-10-23 DIAGNOSIS — G8929 Other chronic pain: Secondary | ICD-10-CM | POA: Insufficient documentation

## 2011-10-23 MED ORDER — OXYCODONE HCL 5 MG PO TABS: 5.0000 mg | ORAL_TABLET | Freq: Four times a day (QID) | ORAL | Status: DC | PRN

## 2011-10-23 MED ORDER — OXYCODONE HCL 5 MG PO TABS
5.0000 mg | ORAL_TABLET | Freq: Once | ORAL | Status: AC
  Administered 2011-10-23: 5 mg via ORAL

## 2011-10-23 MED ORDER — OXYCODONE HCL 5 MG PO TABS
ORAL_TABLET | ORAL | Status: AC
  Filled 2011-10-23: qty 1

## 2011-10-23 MED ORDER — MELOXICAM 7.5 MG PO TABS: ORAL_TABLET | ORAL | Status: DC

## 2011-10-23 NOTE — ED Provider Notes (Signed)
History     CSN: [id number]  Arrival date & time 10/23/11  1504   First MD Initiated Contact with Patient 10/23/11 1609      Chief Complaint  Patient presents with  . Back Pain    (Consider location/radiation/quality/duration/timing/severity/associated sxs/prior treatment) HPI Comments: Pt presents to the Emergency Dept with c/o left leg pain. He sustained a fracture of the left lower leg about May of 2013. He had surgery by orthopedics at [facility]. He states he has a problem trying to get to [facility]. He saw Dr Hilda Lias but was referred to  Orthopedic at [facility] or [place]. He does not have finances to see the specialist. He is scheduled to see Dr Janna Arch on 10/21 for primary care. He request assistance with his pain because this is causing problem with his work and activities of daily living. 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 He now has back pain because of problems walking on the left leg.  The history is provided by the patient.    Past Medical History  Diagnosis Date  . Depression   . Bipolar 1 disorder   . ADHD (attention deficit hyperactivity disorder)   . Hepatitis C   . Chronic pain   . Chronic leg pain   . Chronic back pain   . Sciatica   . Chronic back pain     Past Surgical History  Procedure Date  . Fracture surgery     Family History  Problem Relation Age of Onset  . Diabetes Mother     History  Substance Use Topics  . Smoking status: Current Every Day Smoker -- 1.0 packs/day for 24 years    Types: Cigarettes  . Smokeless tobacco: Former Neurosurgeon    Quit date: 08/08/2006  . Alcohol Use: Yes     occasionally liquor      Review of Systems  Constitutional: Negative for activity change.       All ROS Neg except as noted in HPI  HENT: Negative for nosebleeds and neck pain.   Eyes: Negative for photophobia and discharge.  Respiratory: Negative for cough, shortness of breath and  wheezing.   Cardiovascular: Negative for chest pain and palpitations.  Gastrointestinal: Negative for abdominal pain and blood in stool.  Genitourinary: Negative for dysuria, frequency and hematuria.  Musculoskeletal: Positive for back pain, arthralgias and gait problem.  Skin: Negative.   Neurological: Negative for dizziness, seizures and speech difficulty.  Psychiatric/Behavioral: Negative for hallucinations and confusion.       Depression    Allergies  Acetaminophen and Morphine and related  Home Medications   Current Outpatient Rx  Name Route Sig Dispense Refill  . GOODY HEADACHE PO Oral Take 2 packets by mouth 3 (three) times daily as needed. For pain    . VITAMIN B-12 PO Oral Take 1 capsule by mouth daily.      . QUETIAPINE FUMARATE 100 MG PO TABS Oral Take 100 mg by mouth at bedtime.      BP 129/97  Pulse 88  Temp 98.3 F (36.8 C) (Oral)  Resp 20  Ht 5\' 3"  (1.6 m)  Wt 165 lb (74.844 kg)  BMI 29.23 kg/m2  SpO2 100%  Physical Exam  Nursing note and vitals reviewed. Constitutional: He is oriented to person, place, and time. He appears well-developed and well-nourished.  Non-toxic appearance.  HENT:  Head: Normocephalic.  Right Ear: Tympanic membrane and external ear  normal.  Left Ear: Tympanic membrane and external ear normal.  Eyes: EOM and lids are normal. Pupils are equal, round, and reactive to light.  Neck: Normal range of motion. Neck supple. Carotid bruit is not present.  Cardiovascular: Normal rate, regular rhythm, normal heart sounds, intact distal pulses and normal pulses.   Pulmonary/Chest: Breath sounds normal. No respiratory distress.  Abdominal: Soft. Bowel sounds are normal. There is no tenderness. There is no guarding.  Musculoskeletal: Normal range of motion.       There is full ROM of the left hip, knee, and ankle. There is palpable surgical deformity of the tib/fib area of the left lower leg. Distal pulses symmetrical. No hot areas. No red  streaking.  Lymphadenopathy:       Head (right side): No submandibular adenopathy present.       Head (left side): No submandibular adenopathy present.    He has no cervical adenopathy.  Neurological: He is alert and oriented to person, place, and time. He has normal strength. No cranial nerve deficit or sensory deficit.  Skin: Skin is warm and dry.  Psychiatric: His speech is normal. His mood appears anxious. He is agitated.    ED Course  Procedures (including critical care time)  Labs Reviewed - No data to display No results found. Pulse Ox 100% on room air. WNL by my interpretation.  No diagnosis found.    MDM  I have reviewed nursing notes, vital signs, and all appropriate lab and imaging results for this patient. 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 He states he has an appointment wth Dr Janna Arch 10/21. 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 No new changes noted on exam when compared to previous ED visits. Rx for Roxicodone #15 and Mobic #12 given to the patient. He states he can not take tylenol, and ibuprofen and ultram make him feel worse.       Kathie Dike, [place] 10/23/11 1654

## 2011-10-23 NOTE — ED Notes (Signed)
C/o chronic lower back pain, pt ran out of pain meds

## 2011-10-23 NOTE — ED Notes (Signed)
Patient with complaint of pain at this time. Medicated prior to d/c w/spouse.  Respirations even and unlabored. Skin warm/dry. Discharge instructions reviewed with patient at this time. Patient given opportunity to voice concerns/ask questions.  Patient discharged at this time and left Emergency Department with steady gait.

## 2011-10-24 NOTE — ED Provider Notes (Signed)
Medical screening examination/treatment/procedure(s) were performed by non-physician practitioner and as supervising physician I was immediately available for consultation/collaboration.  Anureet Bruington, MD 10/24/11 0004 

## 2011-11-05 ENCOUNTER — Encounter (HOSPITAL_COMMUNITY): Payer: Self-pay

## 2011-11-05 ENCOUNTER — Emergency Department (HOSPITAL_COMMUNITY)
Admission: EM | Admit: 2011-11-05 | Discharge: 2011-11-05 | Disposition: A | Discharge status: Home / Self Care | Payer: Self-pay | Attending: Emergency Medicine | Admitting: Emergency Medicine

## 2011-11-05 DIAGNOSIS — F319 Bipolar disorder, unspecified: Secondary | ICD-10-CM | POA: Insufficient documentation

## 2011-11-05 DIAGNOSIS — M5430 Sciatica, unspecified side: Secondary | ICD-10-CM | POA: Insufficient documentation

## 2011-11-05 DIAGNOSIS — G8929 Other chronic pain: Secondary | ICD-10-CM

## 2011-11-05 DIAGNOSIS — Z79899 Other long term (current) drug therapy: Secondary | ICD-10-CM | POA: Insufficient documentation

## 2011-11-05 DIAGNOSIS — F329 Major depressive disorder, single episode, unspecified: Secondary | ICD-10-CM | POA: Insufficient documentation

## 2011-11-05 DIAGNOSIS — Z791 Long term (current) use of non-steroidal anti-inflammatories (NSAID): Secondary | ICD-10-CM | POA: Insufficient documentation

## 2011-11-05 DIAGNOSIS — F909 Attention-deficit hyperactivity disorder, unspecified type: Secondary | ICD-10-CM | POA: Insufficient documentation

## 2011-11-05 DIAGNOSIS — M549 Dorsalgia, unspecified: Secondary | ICD-10-CM | POA: Insufficient documentation

## 2011-11-05 DIAGNOSIS — F17200 Nicotine dependence, unspecified, uncomplicated: Secondary | ICD-10-CM | POA: Insufficient documentation

## 2011-11-05 DIAGNOSIS — Z8619 Personal history of other infectious and parasitic diseases: Secondary | ICD-10-CM | POA: Insufficient documentation

## 2011-11-05 DIAGNOSIS — I10 Essential (primary) hypertension: Secondary | ICD-10-CM

## 2011-11-05 DIAGNOSIS — F3289 Other specified depressive episodes: Secondary | ICD-10-CM | POA: Insufficient documentation

## 2011-11-05 DIAGNOSIS — M79609 Pain in unspecified limb: Secondary | ICD-10-CM | POA: Insufficient documentation

## 2011-11-05 MED ORDER — MELOXICAM 7.5 MG PO TABS: ORAL_TABLET | ORAL | Status: DC

## 2011-11-05 MED ORDER — OXYCODONE HCL 5 MG PO TABS: 5.0000 mg | ORAL_TABLET | Freq: Four times a day (QID) | ORAL | Status: DC | PRN

## 2011-11-05 NOTE — ED Provider Notes (Signed)
History     CSN: [id number]  Arrival date & time 11/05/11  1827   First MD Initiated Contact with Patient 11/05/11 1839      Chief Complaint  Patient presents with  . Leg Pain    (Consider location/radiation/quality/duration/timing/severity/associated sxs/prior treatment) HPI Comments: Joshua Thomas is a 36 y.o. Male who has chronic, recurrent left lower leg pain, status post a fracture, requiring open reduction, internal fixation. He is troubled by the local orthopedic care, no medical insurance, and no primary care provider. [facility]. No new injury. No paresthesia, weakness, nausea, vomiting, fever, or chills  Patient is a 36 y.o. male presenting with leg pain. The history is provided by the patient.  Leg Pain     Past Medical History  Diagnosis Date  . Depression   . Bipolar 1 disorder   . ADHD (attention deficit hyperactivity disorder)   . Hepatitis C   . Chronic pain   . Chronic leg pain   . Chronic back pain   . Sciatica   . Chronic back pain     Past Surgical History  Procedure Date  . Fracture surgery     Family History  Problem Relation Age of Onset  . Diabetes Mother     History  Substance Use Topics  . Smoking status: Current Every Day Smoker -- 1.0 packs/day for 24 years    Types: Cigarettes  . Smokeless tobacco: Former Neurosurgeon    Quit date: 08/08/2006  . Alcohol Use: Yes     occasionally liquor      Review of Systems  All other systems reviewed and are negative.    Allergies  Acetaminophen and Morphine and related  Home Medications   Current Outpatient Rx  Name Route Sig Dispense Refill  . GOODY HEADACHE PO Oral Take 2 packets by mouth 3 (three) times daily as needed. For pain    . VITAMIN B-12 PO Oral Take 1 capsule by mouth daily.      . MELOXICAM 7.5 MG PO TABS  1 po bid with food 30 tablet 0  . OXYCODONE HCL 5 MG PO TABS Oral Take 1 tablet (5 mg total) by mouth every 6 (six)  hours as needed for pain. 15 tablet 0  . QUETIAPINE FUMARATE 100 MG PO TABS Oral Take 100 mg by mouth at bedtime.      BP 131/108  Pulse 115  Temp 98.2 F (36.8 C) (Oral)  Resp 20  Ht 5\' 3"  (1.6 m)  Wt 160 lb (72.576 kg)  BMI 28.34 kg/m2  SpO2 100%  Physical Exam  Nursing note and vitals reviewed. Constitutional: He is oriented to person, place, and time. He appears well-developed and well-nourished.  HENT:  Head: Normocephalic and atraumatic.  Right Ear: External ear normal.  Left Ear: External ear normal.  Eyes: Conjunctivae normal and EOM are normal. Pupils are equal, round, and reactive to light.  Neck: Normal range of motion and phonation normal. Neck supple.  Cardiovascular:       Mild tachycardia, and hypertension  Pulmonary/Chest: Effort normal. He exhibits no bony tenderness.  Abdominal: Normal appearance.  Musculoskeletal: Normal range of motion.       Mild swelling left mid tibia, no associated erythema. No apparent foot ischemia  Neurological: He is alert and oriented to person, place, and time. He has normal strength. No cranial nerve deficit or sensory deficit. He exhibits normal muscle tone. Coordination normal.  Skin: Skin is warm, dry and intact.  Psychiatric: He has a normal mood and affect. His behavior is normal. Judgment and thought content normal.    ED Course  Procedures (including critical care time)   Nursing notes, applicable records and vitals reviewed.  Labs Reviewed - No data to display No results found.   1. Chronic leg pain       MDM  Chronic and recurrent left lower leg pain. He has no current PCP. He will be given a short term treatment course. He strongly encouraged to find a primary care provider as soon as possible.    Plan: Home Medications- Mobic, Oxy IR; Home Treatments- rest; Recommended follow up- PCP of choice asap   Flint Melter, MD 11/05/11 2009

## 2011-11-05 NOTE — ED Notes (Signed)
Pt reports was involved in accident May 15th.  Pt has pins and screws, and a rod in his left lower leg.  Area is swollen.  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    ( Patient says his work is on the ground, he doesn't go on the roof top.)

## 2011-11-05 NOTE — ED Notes (Signed)
Pt alert & oriented x4, stable gait. Patient given discharge instructions, paperwork & prescription(s). Patient  instructed to stop at the registration desk to finish any additional paperwork. Patient verbalized understanding. Pt left department w/ no further questions. 

## 2011-11-20 ENCOUNTER — Emergency Department (HOSPITAL_COMMUNITY)
Admission: EM | Admit: 2011-11-20 | Discharge: 2011-11-20 | Disposition: A | Discharge status: Home / Self Care | Payer: Self-pay | Attending: Emergency Medicine | Admitting: Emergency Medicine

## 2011-11-20 ENCOUNTER — Encounter (HOSPITAL_COMMUNITY): Payer: Self-pay | Admitting: *Deleted

## 2011-11-20 DIAGNOSIS — F909 Attention-deficit hyperactivity disorder, unspecified type: Secondary | ICD-10-CM | POA: Insufficient documentation

## 2011-11-20 DIAGNOSIS — M25569 Pain in unspecified knee: Secondary | ICD-10-CM | POA: Insufficient documentation

## 2011-11-20 DIAGNOSIS — Z8781 Personal history of (healed) traumatic fracture: Secondary | ICD-10-CM | POA: Insufficient documentation

## 2011-11-20 DIAGNOSIS — M129 Arthropathy, unspecified: Secondary | ICD-10-CM | POA: Insufficient documentation

## 2011-11-20 DIAGNOSIS — R269 Unspecified abnormalities of gait and mobility: Secondary | ICD-10-CM | POA: Insufficient documentation

## 2011-11-20 DIAGNOSIS — Z87891 Personal history of nicotine dependence: Secondary | ICD-10-CM | POA: Insufficient documentation

## 2011-11-20 DIAGNOSIS — Z8619 Personal history of other infectious and parasitic diseases: Secondary | ICD-10-CM | POA: Insufficient documentation

## 2011-11-20 DIAGNOSIS — G8929 Other chronic pain: Secondary | ICD-10-CM

## 2011-11-20 DIAGNOSIS — F319 Bipolar disorder, unspecified: Secondary | ICD-10-CM | POA: Insufficient documentation

## 2011-11-20 DIAGNOSIS — Z79899 Other long term (current) drug therapy: Secondary | ICD-10-CM | POA: Insufficient documentation

## 2011-11-20 DIAGNOSIS — F17200 Nicotine dependence, unspecified, uncomplicated: Secondary | ICD-10-CM | POA: Insufficient documentation

## 2011-11-20 DIAGNOSIS — M5430 Sciatica, unspecified side: Secondary | ICD-10-CM | POA: Insufficient documentation

## 2011-11-20 MED ORDER — OXYCODONE HCL 5 MG PO CAPS: 5.0000 mg | ORAL_CAPSULE | ORAL | Status: DC | PRN

## 2011-11-20 NOTE — ED Notes (Signed)
Pt reports that he broke his (L) lower leg in May.  States that he has been working the past several days and that his leg has been hurting worse.  Pt denies further injury.  No swelling, bruising noted.  Pain increases with walking.  Pt denies taking medication PTA-reports taking goody powder.

## 2011-11-20 NOTE — ED Provider Notes (Signed)
History     CSN: [id number]  Arrival date & time 11/20/11  4098   First MD Initiated Contact with Patient 11/20/11 [phone]      Chief Complaint  Patient presents with  . Leg Injury    (Consider location/radiation/quality/duration/timing/severity/associated sxs/prior treatment) HPI Comments: Patient presents with complaint of left lower extremity pain that is chronic. Patient had an internal fixation of his left lower leg in May of this year after a fracture. Since that time he has had recurrent pain and swelling in that area. Patient denies new injury to the area. Pain is worse with walking and working. No treatments prior to arrival other than Goody powder. Patients with multiple emergency department visits for pain control over the past several months. He has been forthcoming about these visits. He has seen Dr. Janna Arch recently for pain medication as well. Onset insidious. Course is constant. Nothing makes the symptoms better.  The history is provided by the patient.    Past Medical History  Diagnosis Date  . Depression   . Bipolar 1 disorder   . ADHD (attention deficit hyperactivity disorder)   . Hepatitis C   . Chronic pain   . Chronic leg pain   . Chronic back pain   . Sciatica   . Chronic back pain     Past Surgical History  Procedure Date  . Fracture surgery     Family History  Problem Relation Age of Onset  . Diabetes Mother     History  Substance Use Topics  . Smoking status: Current Every Day Smoker -- 1.0 packs/day for 24 years    Types: Cigarettes  . Smokeless tobacco: Former Neurosurgeon    Quit date: 08/08/2006  . Alcohol Use: Yes     Comment: occasionally liquor      Review of Systems  Constitutional: Negative for activity change.  Musculoskeletal: Positive for joint swelling, arthralgias and gait problem. Negative for back pain.  Skin: Negative for wound.  Neurological: Negative for weakness and numbness.    Allergies  Acetaminophen and Morphine  and related  Home Medications   Current Outpatient Rx  Name  Route  Sig  Dispense  Refill  . GOODY HEADACHE PO   Oral   Take 2 packets by mouth 3 (three) times daily as needed. For pain         . VITAMIN B-12 PO   Oral   Take 1 capsule by mouth daily.           Marland Kitchen MIRTAZAPINE 30 MG PO TABS   Oral   Take 30 mg by mouth at bedtime.         Marland Kitchen QUETIAPINE FUMARATE 100 MG PO TABS   Oral   Take 100 mg by mouth at bedtime.         . OXYCODONE HCL 5 MG PO CAPS   Oral   Take 1 capsule (5 mg total) by mouth every 4 (four) hours as needed.   10 capsule   0     BP 135/90  Pulse 76  Temp 97.6 F (36.4 C)  Resp 18  SpO2 100%  Physical Exam  Nursing note and vitals reviewed. Constitutional: He appears well-developed and well-nourished.  HENT:  Head: Normocephalic and atraumatic.  Eyes: Conjunctivae normal are normal.  Neck: Normal range of motion. Neck supple.  Cardiovascular: Normal pulses.   Pulses:      Dorsalis pedis pulses are 2+ on the right side, and 2+ on the left side.  Posterior tibial pulses are 2+ on the right side, and 2+ on the left side.  Musculoskeletal: He exhibits tenderness. He exhibits no edema.       Callous with tenderness and mild edema to L medial anterior ankle. Skin is warm and pink with good cap refill.   Neurological: He is alert. No sensory deficit.       Motor, sensation, and vascular distal to the injury is fully intact.   Skin: Skin is warm and dry.  Psychiatric: He has a normal mood and affect.    ED Course  Procedures (including critical care time)  Labs Reviewed - No data to display No results found.   1. Chronic pain    9:55 AM Patient seen and examined. Previous x-ray (9/28) reviewed. Crutches ordered.   Vital signs reviewed and are as follows: Filed Vitals:   11/20/11 0932  BP: 135/90  Pulse: 76  Temp: 97.6 F (36.4 C)  Resp: 18   Ortho referral again given. Urged follow-up.   Patient counseled on use of  narcotic pain medications. Counseled not to combine these medications with others containing tylenol. Urged not to drink alcohol, drive, or perform any other activities that requires focus while taking these medications. The patient verbalizes understanding and agrees with the plan.     MDM  Chronic lower leg pain 2/2 previous fracture.         Renne Crigler, [place] 11/20/11 1028

## 2011-11-20 NOTE — Progress Notes (Signed)
Orthopedic Tech Progress Note Patient Details:  Joshua Thomas [birth year] [id number] Patient fitted for crutches for height and comfort. Patient stated he has used crutches in the past and felt comfortable with use.   Ortho Devices Type of Ortho Device: Crutches Ortho Device/Splint Interventions: Application   Asia R Thompson 11/20/2011, 10:05 AM

## 2011-11-20 NOTE — ED Notes (Signed)
See triage note by same RN

## 2011-11-21 NOTE — ED Provider Notes (Signed)
Medical screening examination/treatment/procedure(s) were performed by non-physician practitioner and as supervising physician I was immediately available for consultation/collaboration.  Tynisha Ogan T Kilyn Maragh, MD 11/21/11 0746 

## 2011-11-24 ENCOUNTER — Other Ambulatory Visit (HOSPITAL_COMMUNITY): Payer: Self-pay | Admitting: Orthopaedic Surgery

## 2011-11-24 DIAGNOSIS — M25569 Pain in unspecified knee: Secondary | ICD-10-CM

## 2011-11-26 ENCOUNTER — Ambulatory Visit (HOSPITAL_COMMUNITY): Payer: Self-pay

## 2011-12-01 ENCOUNTER — Ambulatory Visit (HOSPITAL_COMMUNITY)
Admission: RE | Admit: 2011-12-01 | Discharge: 2011-12-01 | Disposition: A | Discharge status: Home / Self Care | Payer: Self-pay | Source: Ambulatory Visit | Attending: Orthopaedic Surgery | Admitting: Orthopaedic Surgery

## 2011-12-01 DIAGNOSIS — M25569 Pain in unspecified knee: Secondary | ICD-10-CM | POA: Insufficient documentation

## 2011-12-01 DIAGNOSIS — X58XXXA Exposure to other specified factors, initial encounter: Secondary | ICD-10-CM | POA: Insufficient documentation

## 2011-12-01 DIAGNOSIS — S82409A Unspecified fracture of shaft of unspecified fibula, initial encounter for closed fracture: Secondary | ICD-10-CM | POA: Insufficient documentation

## 2011-12-04 ENCOUNTER — Emergency Department (HOSPITAL_COMMUNITY)
Admission: EM | Admit: 2011-12-04 | Discharge: 2011-12-04 | Disposition: A | Discharge status: Home / Self Care | Payer: Self-pay | Attending: Emergency Medicine | Admitting: Emergency Medicine

## 2011-12-04 ENCOUNTER — Encounter (HOSPITAL_COMMUNITY): Payer: Self-pay | Admitting: *Deleted

## 2011-12-04 DIAGNOSIS — M5430 Sciatica, unspecified side: Secondary | ICD-10-CM | POA: Insufficient documentation

## 2011-12-04 DIAGNOSIS — F909 Attention-deficit hyperactivity disorder, unspecified type: Secondary | ICD-10-CM | POA: Insufficient documentation

## 2011-12-04 DIAGNOSIS — F329 Major depressive disorder, single episode, unspecified: Secondary | ICD-10-CM | POA: Insufficient documentation

## 2011-12-04 DIAGNOSIS — F319 Bipolar disorder, unspecified: Secondary | ICD-10-CM | POA: Insufficient documentation

## 2011-12-04 DIAGNOSIS — M79609 Pain in unspecified limb: Secondary | ICD-10-CM | POA: Insufficient documentation

## 2011-12-04 DIAGNOSIS — G8929 Other chronic pain: Secondary | ICD-10-CM | POA: Insufficient documentation

## 2011-12-04 DIAGNOSIS — F17200 Nicotine dependence, unspecified, uncomplicated: Secondary | ICD-10-CM | POA: Insufficient documentation

## 2011-12-04 DIAGNOSIS — M79606 Pain in leg, unspecified: Secondary | ICD-10-CM

## 2011-12-04 DIAGNOSIS — M549 Dorsalgia, unspecified: Secondary | ICD-10-CM | POA: Insufficient documentation

## 2011-12-04 DIAGNOSIS — F3289 Other specified depressive episodes: Secondary | ICD-10-CM | POA: Insufficient documentation

## 2011-12-04 DIAGNOSIS — B1920 Unspecified viral hepatitis C without hepatic coma: Secondary | ICD-10-CM | POA: Insufficient documentation

## 2011-12-04 DIAGNOSIS — Z79899 Other long term (current) drug therapy: Secondary | ICD-10-CM | POA: Insufficient documentation

## 2011-12-04 MED ORDER — OXYCODONE-ACETAMINOPHEN 5-325 MG PO TABS: 1.0000 | ORAL_TABLET | Freq: Four times a day (QID) | ORAL | Status: DC | PRN

## 2011-12-04 MED ORDER — HYDROCODONE-IBUPROFEN 7.5-200 MG PO TABS: 1.0000 | ORAL_TABLET | Freq: Four times a day (QID) | ORAL | Status: DC | PRN

## 2011-12-04 NOTE — ED Provider Notes (Signed)
History   This chart was scribed for Geoffery Lyons, MD, by Frederik Pear, ER scribe. The patient was seen in room TR07C/TR07C and the patient's care was started at 1659.    CSN: [id number]  Arrival date & time 12/04/11  1650   First MD Initiated Contact with Patient 12/04/11 1659      Chief Complaint  Patient presents with  . Leg Pain    (Consider location/radiation/quality/duration/timing/severity/associated sxs/prior treatment) HPI Comments: Joshua Thomas is a 36 y.o. male who presents to the Emergency Department complaining of constant, moderate left leg pain that began after he had an accident where he fell 3 stories off a roof 5 months ago. He states that he had surgery after the accident with Dr. Magnus Ivan in [facility]. He is ambulatory with crutches. He states that he is unable to take acetaminophen, morphine and related medications because of issues with his liver. He states that he is supposed to see Dr. Magnus Ivan at the end of next week to determine if he should have his screws removed or how he should move forward with his leg.      Past Medical History  Diagnosis Date  . Depression   . Bipolar 1 disorder   . ADHD (attention deficit hyperactivity disorder)   . Hepatitis C   . Chronic pain   . Chronic leg pain   . Chronic back pain   . Sciatica   . Chronic back pain     Past Surgical History  Procedure Date  . Fracture surgery     Family History  Problem Relation Age of Onset  . Diabetes Mother     History  Substance Use Topics  . Smoking status: Current Every Day Smoker -- 1.0 packs/day for 24 years    Types: Cigarettes  . Smokeless tobacco: Former Neurosurgeon    Quit date: 08/08/2006  . Alcohol Use: Yes     Comment: occasionally liquor      Review of Systems A complete 10 system review of systems was obtained and all systems are negative except as noted in the HPI and PMH.   Allergies  Acetaminophen and Morphine and related  Home Medications    Current Outpatient Rx  Name  Route  Sig  Dispense  Refill  . GOODY HEADACHE PO   Oral   Take 2 packets by mouth 3 (three) times daily as needed. For pain         . VITAMIN B-12 PO   Oral   Take 1 capsule by mouth daily.           Marland Kitchen MIRTAZAPINE 30 MG PO TABS   Oral   Take 30 mg by mouth at bedtime.         . OXYCODONE HCL 5 MG PO CAPS   Oral   Take 1 capsule (5 mg total) by mouth every 4 (four) hours as needed.   10 capsule   0   . QUETIAPINE FUMARATE 100 MG PO TABS   Oral   Take 100 mg by mouth at bedtime.           BP 142/93  Pulse 110  Temp 98.2 F (36.8 C) (Oral)  Resp 18  SpO2 100%  Physical Exam  Nursing note and vitals reviewed. Constitutional: He is oriented to person, place, and time. He appears well-developed and well-nourished. No distress.  HENT:  Head: Normocephalic and atraumatic.  Eyes: EOM are normal. Pupils are equal, round, and reactive to light.  Neck: Normal range of motion. Neck supple. No tracheal deviation present.  Cardiovascular: Normal rate.   Pulmonary/Chest: Effort normal. No respiratory distress.  Abdominal: Soft. He exhibits no distension.  Musculoskeletal: Normal range of motion. He exhibits no edema.       The left lower extremity is known to have a bony protuberance in the mid-tibial region. His distal pulses, motor and sensory, are all intact.  Neurological: He is alert and oriented to person, place, and time.  Skin: Skin is warm and dry.  Psychiatric: He has a normal mood and affect. His behavior is normal.    ED Course  Procedures (including critical care time)  DIAGNOSTIC STUDIES: Oxygen Saturation is 100% on room air, normal by my interpretation.    COORDINATION OF CARE:  17:25- Discussed planned course of treatment with the patient, including pain medication and following up with Dr. Magnus Ivan,  who is agreeable at this time.    Labs Reviewed - No data to display No results found.   No diagnosis  found.    MDM  The patient presents here again requesting pain medication.  He has pain in his leg resulting from a fall several months ago.  He tells me he has an appointment with Dr. Magnus Ivan in the near future and is out of his pain medication.    He has been to the ER multiple times for the past several months and multiple times in 2012.  He is requesting Oxy IR as he tells me he cannot take tylenol due to a liver problem.  I agreed to write him for vicoprofen, however he tells me he cannot afford this.  I reluctantly agreed to write for 15 percocet as I am uncomfortable prescribing oxycontin from the ER.  He was not happy about this as he wanted straight oxycontin.  I have informed him that this medication and further narcotics will need to come from his pcp, not the ED.  He understands this, but did not leave happy.     I personally performed the services described in this documentation, which was scribed in my presence. The recorded information has been reviewed and is accurate.          Geoffery Lyons, MD 12/04/11 1750

## 2011-12-04 NOTE — ED Notes (Signed)
Pt reports having left leg pain following accident 5 months ago. Ambulatory with crutches.

## 2011-12-04 NOTE — ED Notes (Signed)
Pt states he is having left lower leg pain since May. Pt states pain was getting better but it now worse. Pt states he is having trouble putting pressure on his leg. Pt ambulatory to room with crutches. No swelling or new deformity noted.

## 2011-12-14 ENCOUNTER — Encounter (HOSPITAL_COMMUNITY): Payer: Self-pay | Admitting: Emergency Medicine

## 2011-12-14 ENCOUNTER — Emergency Department (HOSPITAL_COMMUNITY)
Admission: EM | Admit: 2011-12-14 | Discharge: 2011-12-14 | Disposition: A | Discharge status: Home / Self Care | Payer: Self-pay | Attending: Emergency Medicine | Admitting: Emergency Medicine

## 2011-12-14 DIAGNOSIS — F17200 Nicotine dependence, unspecified, uncomplicated: Secondary | ICD-10-CM | POA: Insufficient documentation

## 2011-12-14 DIAGNOSIS — M549 Dorsalgia, unspecified: Secondary | ICD-10-CM

## 2011-12-14 DIAGNOSIS — S99929A Unspecified injury of unspecified foot, initial encounter: Secondary | ICD-10-CM | POA: Insufficient documentation

## 2011-12-14 DIAGNOSIS — M5430 Sciatica, unspecified side: Secondary | ICD-10-CM | POA: Insufficient documentation

## 2011-12-14 DIAGNOSIS — F319 Bipolar disorder, unspecified: Secondary | ICD-10-CM | POA: Insufficient documentation

## 2011-12-14 DIAGNOSIS — Y939 Activity, unspecified: Secondary | ICD-10-CM | POA: Insufficient documentation

## 2011-12-14 DIAGNOSIS — F909 Attention-deficit hyperactivity disorder, unspecified type: Secondary | ICD-10-CM | POA: Insufficient documentation

## 2011-12-14 DIAGNOSIS — Z79899 Other long term (current) drug therapy: Secondary | ICD-10-CM | POA: Insufficient documentation

## 2011-12-14 DIAGNOSIS — Y929 Unspecified place or not applicable: Secondary | ICD-10-CM | POA: Insufficient documentation

## 2011-12-14 DIAGNOSIS — S8990XA Unspecified injury of unspecified lower leg, initial encounter: Secondary | ICD-10-CM | POA: Insufficient documentation

## 2011-12-14 DIAGNOSIS — IMO0002 Reserved for concepts with insufficient information to code with codable children: Secondary | ICD-10-CM | POA: Insufficient documentation

## 2011-12-14 DIAGNOSIS — W108XXA Fall (on) (from) other stairs and steps, initial encounter: Secondary | ICD-10-CM | POA: Insufficient documentation

## 2011-12-14 DIAGNOSIS — F3289 Other specified depressive episodes: Secondary | ICD-10-CM | POA: Insufficient documentation

## 2011-12-14 DIAGNOSIS — G8929 Other chronic pain: Secondary | ICD-10-CM | POA: Insufficient documentation

## 2011-12-14 DIAGNOSIS — F329 Major depressive disorder, single episode, unspecified: Secondary | ICD-10-CM | POA: Insufficient documentation

## 2011-12-14 MED ORDER — KETOROLAC TROMETHAMINE 30 MG/ML IJ SOLN
30.0000 mg | Freq: Once | INTRAMUSCULAR | Status: DC
  Filled 2011-12-14: qty 1

## 2011-12-14 MED ORDER — IBUPROFEN 600 MG PO TABS: 600.0000 mg | ORAL_TABLET | Freq: Three times a day (TID) | ORAL | Status: DC

## 2011-12-14 NOTE — ED Notes (Signed)
Pt rates his lower back 9/10 sharp pain after today's fall where pt landed flat on back. Pt recently fell this past May following screws and rods placed in left knee and leg, and MRI shows bulging discs and protrusions in spine, as well as, CAT scan two weeks ago revealing leg not healing properly. A&Ox4, ambulatory with 1 person assist, nad.

## 2011-12-14 NOTE — ED Provider Notes (Signed)
History    This chart was scribed for Gerhard Munch, MD, MD by Smitty Pluck, ED Scribe. The patient was seen in room TR07C and the patient's care was started at 6:24PM.   CSN: [id number]  Arrival date & time 12/14/11  1721      Chief Complaint  Patient presents with  . Back Pain  . Leg Pain    (Consider location/radiation/quality/duration/timing/severity/associated sxs/prior treatment) The history is provided by the patient. No language interpreter was used.   Joshua Thomas is a 36 y.o. male with hx of Hep C, sciatica, chronic leg pain and chronic back pain who presents to the Emergency Department complaining of constant, moderate, sharp lower back pain onset today due to falling down 2 steps. Pt reports that pain is aggravated by walking. Pt reports that his leg "gave out" causing him to fall. Pt reports that he has numbness in left great toe. He denies any other numbness, weakness, incontinence and any other pain. Pt has used oxycodone without relief.   Past Medical History  Diagnosis Date  . Depression   . Bipolar 1 disorder   . ADHD (attention deficit hyperactivity disorder)   . Hepatitis C   . Chronic pain   . Chronic leg pain   . Chronic back pain   . Sciatica   . Chronic back pain     Past Surgical History  Procedure Date  . Fracture surgery     Family History  Problem Relation Age of Onset  . Diabetes Mother     History  Substance Use Topics  . Smoking status: Current Every Day Smoker -- 1.0 packs/day for 24 years    Types: Cigarettes  . Smokeless tobacco: Former Neurosurgeon    Quit date: 08/08/2006  . Alcohol Use: Yes     Comment: occasionally liquor      Review of Systems  Constitutional:       Per HPI, otherwise negative  HENT:       Per HPI, otherwise negative  Eyes: Negative.   Respiratory:       Per HPI, otherwise negative  Cardiovascular:       Per HPI, otherwise negative  Gastrointestinal: Negative for vomiting.  Genitourinary: Negative.    Musculoskeletal:       Per HPI, otherwise negative  Skin: Negative.   Neurological: Negative for syncope.    Allergies  Acetaminophen and Morphine and related  Home Medications   Current Outpatient Rx  Name  Route  Sig  Dispense  Refill  . VITAMIN B-12 PO   Oral   Take 1 capsule by mouth daily.           . DULOXETINE HCL 30 MG PO CPEP   Oral   Take 30 mg by mouth at bedtime.         Marland Kitchen QUETIAPINE FUMARATE ER 150 MG PO TB24   Oral   Take 150 mg by mouth at bedtime.           BP 146/94  Pulse 110  Temp 98.1 F (36.7 C) (Oral)  Resp 18  SpO2 99%  Physical Exam  Nursing note and vitals reviewed. Constitutional: He is oriented to person, place, and time. He appears well-developed. No distress.  HENT:  Head: Normocephalic and atraumatic.  Eyes: Conjunctivae normal and EOM are normal.  Cardiovascular: Normal rate and intact distal pulses.   Pulmonary/Chest: Effort normal. No stridor. No respiratory distress.  Abdominal: He exhibits no distension.  Musculoskeletal: Normal range of  motion. He exhibits no edema.       No deformity, mild diffuse tenderness to palpation about the back, no rash   Neurological: He is alert and oriented to person, place, and time.  Skin: Skin is warm and dry.  Psychiatric: He has a normal mood and affect.    ED Course  Procedures (including critical care time)  During the physical exam, when asked what has he been taking for pain relief.  The patient eventually acknowledges taking narcotics, states that these "haven't helped at all."   COORDINATION OF CARE: 6:29 PM Discussed ED treatment with pt     Labs Reviewed - No data to display No results found.   No diagnosis found.  Soon after the patient's arrival I conducted a search of the [place] drug prescription database.  He is notable for the patient's recent history of obtaining 85 tablets of narcotics within the past month.  Over the past 3 months she has similar  quantities.  Each of these prescriptions from different providers.  MDM  I personally performed the services described in this documentation, which was scribed in my presence. The recorded information has been reviewed and is accurate.  This patient presents after a possible fall with worsening pain in his back and left leg.  Notably, the patient has a history of multiple prior dictations for somewhat similar events.  Today he is in no distress, though he is mildly tachycardic.  The patient has appropriate neurologic capacity, is awake and alert, oriented, afebrile, and there is low suspicion for acute progression of illness.  After a search of the drug prescription database, it is clear that the patient has been obtaining narcotics from multiple providers recently.  After the patient stated that narcotics haven't helped his pain at all, I informed him that he could be discharged with ibuprofen, similar anti-inflammatories would be provided here.  He was discharged.  Gerhard Munch, MD 12/14/11 [phone]

## 2011-12-14 NOTE — ED Notes (Signed)
Pt c/o lower back to left leg pain starting today after falling down 2 steps

## 2012-01-03 ENCOUNTER — Encounter (HOSPITAL_COMMUNITY): Payer: Self-pay | Admitting: *Deleted

## 2012-01-03 ENCOUNTER — Emergency Department (HOSPITAL_COMMUNITY): Payer: Self-pay

## 2012-01-03 ENCOUNTER — Emergency Department (HOSPITAL_COMMUNITY)
Admission: EM | Admit: 2012-01-03 | Discharge: 2012-01-03 | Disposition: A | Discharge status: Home / Self Care | Payer: Self-pay | Attending: Emergency Medicine | Admitting: Emergency Medicine

## 2012-01-03 DIAGNOSIS — F909 Attention-deficit hyperactivity disorder, unspecified type: Secondary | ICD-10-CM | POA: Insufficient documentation

## 2012-01-03 DIAGNOSIS — M5430 Sciatica, unspecified side: Secondary | ICD-10-CM | POA: Insufficient documentation

## 2012-01-03 DIAGNOSIS — Z79899 Other long term (current) drug therapy: Secondary | ICD-10-CM | POA: Insufficient documentation

## 2012-01-03 DIAGNOSIS — N23 Unspecified renal colic: Secondary | ICD-10-CM | POA: Insufficient documentation

## 2012-01-03 DIAGNOSIS — F17200 Nicotine dependence, unspecified, uncomplicated: Secondary | ICD-10-CM | POA: Insufficient documentation

## 2012-01-03 DIAGNOSIS — M549 Dorsalgia, unspecified: Secondary | ICD-10-CM | POA: Insufficient documentation

## 2012-01-03 DIAGNOSIS — F319 Bipolar disorder, unspecified: Secondary | ICD-10-CM | POA: Insufficient documentation

## 2012-01-03 DIAGNOSIS — Z87442 Personal history of urinary calculi: Secondary | ICD-10-CM | POA: Insufficient documentation

## 2012-01-03 DIAGNOSIS — F3289 Other specified depressive episodes: Secondary | ICD-10-CM | POA: Insufficient documentation

## 2012-01-03 DIAGNOSIS — F329 Major depressive disorder, single episode, unspecified: Secondary | ICD-10-CM | POA: Insufficient documentation

## 2012-01-03 DIAGNOSIS — G8929 Other chronic pain: Secondary | ICD-10-CM | POA: Insufficient documentation

## 2012-01-03 DIAGNOSIS — M79609 Pain in unspecified limb: Secondary | ICD-10-CM | POA: Insufficient documentation

## 2012-01-03 DIAGNOSIS — Z8619 Personal history of other infectious and parasitic diseases: Secondary | ICD-10-CM | POA: Insufficient documentation

## 2012-01-03 LAB — BASIC METABOLIC PANEL
Chloride: 103 mEq/L (ref 96–112)
GFR calc Af Amer: >90 mL/min (ref >90)
Potassium: 4.2 mEq/L (ref 3.5–5.1)

## 2012-01-03 LAB — URINALYSIS, ROUTINE W REFLEX MICROSCOPIC
Ketones, ur: NEGATIVE mg/dL
Leukocytes, UA: NEGATIVE
Nitrite: NEGATIVE
pH: 7.5 (ref 5.0–8.0)

## 2012-01-03 LAB — CBC WITH DIFFERENTIAL/PLATELET
Basophils Absolute: 0 10*3/uL (ref 0.0–0.1)
Basophils Relative: 0 % (ref 0–1)
Hemoglobin: 17.1 g/dL — ABNORMAL HIGH (ref 13.0–17.0)
Lymphocytes Relative: 35 % (ref 12–46)
MCHC: 34.8 g/dL (ref 30.0–36.0)
Monocytes Relative: 8 % (ref 3–12)
Neutro Abs: 3.4 10*3/uL (ref 1.7–7.7)
Neutrophils Relative %: 55 % (ref 43–77)
RDW: 12.5 % (ref 11.5–15.5)
WBC: 6.1 10*3/uL (ref 4.0–10.5)

## 2012-01-03 MED ORDER — OXYCODONE-ACETAMINOPHEN 5-325 MG PO TABS: 1.0000 | ORAL_TABLET | ORAL | Status: AC | PRN

## 2012-01-03 MED ORDER — HYDROMORPHONE HCL PF 2 MG/ML IJ SOLN
2.0000 mg | Freq: Once | INTRAMUSCULAR | Status: AC
  Administered 2012-01-03: 2 mg via INTRAVENOUS
  Filled 2012-01-03: qty 1

## 2012-01-03 MED ORDER — TAMSULOSIN HCL 0.4 MG PO CAPS: 0.4000 mg | ORAL_CAPSULE | Freq: Every day | ORAL | Status: DC

## 2012-01-03 MED ORDER — FENTANYL CITRATE 0.05 MG/ML IJ SOLN
INTRAMUSCULAR | Status: AC
  Administered 2012-01-03: 50 ug
  Filled 2012-01-03: qty 2

## 2012-01-03 MED ORDER — ONDANSETRON HCL 4 MG/2ML IJ SOLN
4.0000 mg | Freq: Once | INTRAMUSCULAR | Status: AC
  Administered 2012-01-03: 4 mg via INTRAVENOUS
  Filled 2012-01-03: qty 2

## 2012-01-03 NOTE — ED Notes (Signed)
Pt is here with intermittent right flank pain that comes in waves that started yesterday afternoon.  No nausea

## 2012-01-04 NOTE — ED Provider Notes (Signed)
History     CSN: [id number]  Arrival date & time 01/03/12  1424   First MD Initiated Contact with Patient 01/03/12 1822      Chief Complaint  Patient presents with  . Flank Pain    (Consider location/radiation/quality/duration/timing/severity/associated sxs/prior treatment) Patient is a 36 y.o. male presenting with flank pain. The history is provided by the patient and medical records. No language interpreter was used.  Flank Pain This is a new problem. The current episode started yesterday. Episode frequency: Pain is in right flank, waxes and wanes in severity, present since yesterday.  He has a history of kidney stones. The problem has not changed since onset.Associated symptoms comments: None . Nothing aggravates the symptoms. Nothing relieves the symptoms. He has tried nothing for the symptoms.    Past Medical History  Diagnosis Date  . Depression   . Bipolar 1 disorder   . ADHD (attention deficit hyperactivity disorder)   . Hepatitis C   . Chronic pain   . Chronic leg pain   . Chronic back pain   . Sciatica   . Chronic back pain   . Kidney stone     Past Surgical History  Procedure Date  . Fracture surgery     Family History  Problem Relation Age of Onset  . Diabetes Mother     History  Substance Use Topics  . Smoking status: Current Every Day Smoker -- 1.0 packs/day for 24 years    Types: Cigarettes  . Smokeless tobacco: Former Neurosurgeon    Quit date: 08/08/2006  . Alcohol Use: Yes     Comment: occasionally liquor      Review of Systems  Constitutional: Negative.  Negative for fever and chills.  HENT: Negative.   Eyes: Negative.   Respiratory: Negative.   Cardiovascular: Negative.   Gastrointestinal: Negative.   Genitourinary: Positive for flank pain.  Musculoskeletal: Negative.   Skin: Negative.   Neurological: Negative.   Psychiatric/Behavioral: Negative.     Allergies  Acetaminophen and Morphine and related  Home Medications   Current  Outpatient Rx  Name  Route  Sig  Dispense  Refill  . VITAMIN B-12 PO   Oral   Take 1 capsule by mouth daily.           . DULOXETINE HCL 30 MG PO CPEP   Oral   Take 30 mg by mouth at bedtime.         . IBUPROFEN 600 MG PO TABS   Oral   Take 1 tablet (600 mg total) by mouth 3 (three) times daily.   12 tablet   0   . MIRTAZAPINE 30 MG PO TABS   Oral   Take 30 mg by mouth at bedtime.         . OXYCODONE HCL 5 MG PO CAPS   Oral   Take 5 mg by mouth every 4 (four) hours as needed. For pain         . QUETIAPINE FUMARATE ER 150 MG PO TB24   Oral   Take 150 mg by mouth at bedtime.         . OXYCODONE-ACETAMINOPHEN 5-325 MG PO TABS   Oral   Take 1 tablet by mouth every 4 (four) hours as needed for pain.   20 tablet   0   . TAMSULOSIN HCL 0.4 MG PO CAPS   Oral   Take 1 capsule (0.4 mg total) by mouth daily.   5 capsule   0  BP 148/113  Pulse 82  Temp 97.6 F (36.4 C) (Oral)  Resp 18  SpO2 100%  Physical Exam  Nursing note and vitals reviewed. Constitutional: He is oriented to person, place, and time. He appears well-developed and well-nourished.       In acute distress with right flank pain.  HENT:  Head: Normocephalic and atraumatic.  Right Ear: External ear normal.  Left Ear: External ear normal.  Mouth/Throat: Oropharynx is clear and moist.  Eyes: Conjunctivae normal and EOM are normal. Pupils are equal, round, and reactive to light.  Neck: Normal range of motion. Neck supple.  Cardiovascular: Normal rate, regular rhythm and normal heart sounds.   Pulmonary/Chest: Effort normal and breath sounds normal.  Abdominal: Soft. Bowel sounds are normal.  Musculoskeletal:       He localizes pain to the right flank.  There is no mass or tenderness to palpation.  Neurological: He is alert and oriented to person, place, and time.       No sensory or motor deficit.  Skin: Skin is warm and dry.  Psychiatric: He has a normal mood and affect. His behavior is  normal.    ED Course  Procedures (including critical care time)  Labs Reviewed  CBC WITH DIFFERENTIAL - Abnormal; Notable for the following:    Hemoglobin 17.1 (*)     All other components within normal limits  BASIC METABOLIC PANEL - Abnormal; Notable for the following:    Glucose, Bld 114 (*)     All other components within normal limits  URINALYSIS, ROUTINE W REFLEX MICROSCOPIC  LAB REPORT - SCANNED   Ct Abdomen Pelvis Wo Contrast  01/03/2012  *RADIOLOGY REPORT*  Clinical Data: Right flank pain, history of kidney stones  CT ABDOMEN AND PELVIS WITHOUT CONTRAST  Technique:  Multidetector CT imaging of the abdomen and pelvis was performed following the standard protocol without intravenous contrast.  Comparison: [facility] CT abdomen pelvis dated 01/02/2011.  Findings: Lung bases are clear.  Unenhanced liver, pancreas, and adrenal glands are within normal limits.  Suspected embolization clips in the spleen (series 2/image 25), new.  Gallbladder is unremarkable.  No intrahepatic or extrahepatic ductal dilatation.  Multiple nonobstructing right renal calculi measuring up to 3 mm in the right upper pole (image 86).  Possible punctate nonobstructing left lower pole renal calculus (coronal image 78).  No hydronephrosis.  No evidence of bowel obstruction.  Normal appendix.  No evidence of abdominal aortic aneurysm.  No abdominopelvic ascites.  No suspicious abdominopelvic lymphadenopathy.  Prostate is unremarkable.  No ureteral or bladder calculi.  Bladder is within normal limits.  Superior endplate changes at L1 (sagittal image 35), likely old although new from 2012, possibly post-traumatic.  No retropulsion.  IMPRESSION: Multiple nonobstructing renal calculi measuring to 2 mm in the right upper pole.  No ureteral or bladder calculi.  No hydronephrosis.  Normal appendix.   Original Report Authenticated By: Charline Bills, M.D.    Pt has Hx of chronic pain, but seemed to have legitimate right renal  colic today.  CT showed right renal calculi, no ureteral calculi.  Had relief with pain meds.  Will treat as a kidney stone with pain meds, Flomax, strain urine, urology referral.  1. Renal colic         Carleene Cooper III, MD 01/04/12 1259

## 2012-01-17 ENCOUNTER — Other Ambulatory Visit (HOSPITAL_COMMUNITY): Payer: Self-pay | Admitting: Orthopaedic Surgery

## 2012-01-19 ENCOUNTER — Encounter (HOSPITAL_COMMUNITY): Payer: Self-pay | Admitting: Pharmacy Technician

## 2012-01-20 ENCOUNTER — Other Ambulatory Visit (HOSPITAL_COMMUNITY): Payer: Self-pay | Admitting: Family Medicine

## 2012-01-20 DIAGNOSIS — M545 Low back pain: Secondary | ICD-10-CM

## 2012-01-24 ENCOUNTER — Encounter (HOSPITAL_COMMUNITY): Payer: Self-pay

## 2012-01-24 ENCOUNTER — Encounter (HOSPITAL_COMMUNITY)
Admission: RE | Admit: 2012-01-24 | Discharge: 2012-01-24 | Disposition: A | Discharge status: Home / Self Care | Payer: Worker's Compensation | Source: Ambulatory Visit | Attending: Orthopaedic Surgery | Admitting: Orthopaedic Surgery

## 2012-01-24 HISTORY — DX: Other intervertebral disc degeneration, lumbar region: M51.36

## 2012-01-24 HISTORY — DX: Other intervertebral disc degeneration, lumbar region without mention of lumbar back pain or lower extremity pain: M51.369

## 2012-01-24 HISTORY — DX: Personal history of other diseases of the digestive system: Z87.19

## 2012-01-24 HISTORY — DX: Gastro-esophageal reflux disease without esophagitis: K21.9

## 2012-01-24 HISTORY — DX: Personal history of peptic ulcer disease: Z87.11

## 2012-01-24 LAB — COMPREHENSIVE METABOLIC PANEL
ALT: 70 U/L — ABNORMAL HIGH (ref 0–53)
AST: 34 U/L (ref 0–37)
Alkaline Phosphatase: 47 U/L (ref 39–117)
CO2: 29 mEq/L (ref 19–32)
GFR calc Af Amer: >90 mL/min (ref >90)
Glucose, Bld: 126 mg/dL — ABNORMAL HIGH (ref 70–99)
Potassium: 5 mEq/L (ref 3.5–5.1)
Sodium: 138 mEq/L (ref 135–145)
Total Protein: 7.5 g/dL (ref 6.0–8.3)

## 2012-01-24 LAB — CBC
Hemoglobin: 16.8 g/dL (ref 13.0–17.0)
Platelets: 166 10*3/uL (ref 150–400)
RBC: 5.27 MIL/uL (ref 4.22–5.81)

## 2012-01-24 LAB — SURGICAL PCR SCREEN
MRSA, PCR: POSITIVE — AB
Staphylococcus aureus: POSITIVE — AB

## 2012-01-24 NOTE — Patient Instructions (Addendum)
Joshua Thomas  01/24/2012                           YOUR PROCEDURE IS SCHEDULED ON:  01/27/12               PLEASE REPORT TO SHORT STAY CENTER AT :  7:45 am               CALL THIS NUMBER IF ANY PROBLEMS THE DAY OF SURGERY [phone]                      REMEMBER:   Do not eat food or drink liquids AFTER MIDNIGHT   Take these medicines the morning of surgery with A SIP OF WATER: OXYCODONE  Do not wear jewelry, make-up   Do not wear lotions, powders, or perfumes.   Do not shave legs or underarms 12 hrs. before surgery (men may shave face)  Do not bring valuables to the hospital.  Contacts, dentures or bridgework may not be worn into surgery.  Leave suitcase in the car. After surgery it may be brought to your room.  For patients admitted to the hospital more than one night, checkout time is 11:00                          The day of discharge.   Patients discharged the day of surgery will not be allowed to drive home                             If going home same day of surgery, must have someone stay with you first                           24 hrs at home and arrange for some one to drive you home from hospital.    Special Instructions:   Please read over the following fact sheets that you were given:               1. MRSA  INFORMATION                      2. [place] PREPARING FOR SURGERY SHEET                                                X_____________________________________________________________________        Failure to follow these instructions may result in cancellation of your surgery

## 2012-01-26 ENCOUNTER — Ambulatory Visit (HOSPITAL_COMMUNITY)
Admission: RE | Admit: 2012-01-26 | Discharge: 2012-01-26 | Disposition: A | Discharge status: Home / Self Care | Payer: Self-pay | Source: Ambulatory Visit | Attending: Family Medicine | Admitting: Family Medicine

## 2012-01-26 DIAGNOSIS — M5126 Other intervertebral disc displacement, lumbar region: Secondary | ICD-10-CM | POA: Insufficient documentation

## 2012-01-26 DIAGNOSIS — M51379 Other intervertebral disc degeneration, lumbosacral region without mention of lumbar back pain or lower extremity pain: Secondary | ICD-10-CM | POA: Insufficient documentation

## 2012-01-26 DIAGNOSIS — M79609 Pain in unspecified limb: Secondary | ICD-10-CM | POA: Insufficient documentation

## 2012-01-26 DIAGNOSIS — M545 Low back pain, unspecified: Secondary | ICD-10-CM | POA: Insufficient documentation

## 2012-01-26 DIAGNOSIS — M5137 Other intervertebral disc degeneration, lumbosacral region: Secondary | ICD-10-CM | POA: Insufficient documentation

## 2012-01-26 NOTE — Anesthesia Preprocedure Evaluation (Signed)
Anesthesia Evaluation  Patient identified by MRN, date of birth, ID band Patient awake    Reviewed: Allergy & Precautions, H&P , NPO status , Patient's Chart, lab work & pertinent test results  Airway Mallampati: II TM Distance: >3 FB Neck ROM: full    Dental No notable dental hx. (+) Teeth Intact and Dental Advisory Given   Pulmonary neg pulmonary ROS,  breath sounds clear to auscultation  Pulmonary exam normal       Cardiovascular Exercise Tolerance: Good negative cardio ROS  Rhythm:regular Rate:Normal     Neuro/Psych negative neurological ROS  negative psych ROS   GI/Hepatic negative GI ROS, Neg liver ROS, (+) Hepatitis -, C  Endo/Other  negative endocrine ROS  Renal/GU negative Renal ROS  negative genitourinary   Musculoskeletal   Abdominal   Peds  Hematology negative hematology ROS (+)   Anesthesia Other Findings   Reproductive/Obstetrics negative OB ROS                           Anesthesia Physical Anesthesia Plan  ASA: II  Anesthesia Plan: General   Post-op Pain Management:    Induction: Intravenous  Airway Management Planned: Oral ETT  Additional Equipment:   Intra-op Plan:   Post-operative Plan: Extubation in OR  Informed Consent: I have reviewed the patients History and Physical, chart, labs and discussed the procedure including the risks, benefits and alternatives for the proposed anesthesia with the patient or authorized representative who has indicated his/her understanding and acceptance.   Dental Advisory Given  Plan Discussed with: CRNA and Surgeon  Anesthesia Plan Comments:         Anesthesia Quick Evaluation

## 2012-01-27 ENCOUNTER — Encounter (HOSPITAL_COMMUNITY): Payer: Self-pay | Admitting: Anesthesiology

## 2012-01-27 ENCOUNTER — Inpatient Hospital Stay (HOSPITAL_COMMUNITY): Payer: Worker's Compensation | Admitting: Anesthesiology

## 2012-01-27 ENCOUNTER — Encounter (HOSPITAL_COMMUNITY): Payer: Self-pay

## 2012-01-27 ENCOUNTER — Inpatient Hospital Stay (HOSPITAL_COMMUNITY)
Admission: RE | Admit: 2012-01-27 | Discharge: 2012-01-29 | DRG: 493 | Disposition: A | Discharge status: Home / Self Care | Payer: Worker's Compensation | Source: Ambulatory Visit | Attending: Orthopaedic Surgery | Admitting: Orthopaedic Surgery

## 2012-01-27 ENCOUNTER — Other Ambulatory Visit (HOSPITAL_COMMUNITY): Payer: Self-pay

## 2012-01-27 ENCOUNTER — Inpatient Hospital Stay (HOSPITAL_COMMUNITY): Payer: Worker's Compensation

## 2012-01-27 ENCOUNTER — Encounter (HOSPITAL_COMMUNITY)
Admission: RE | Disposition: A | Discharge status: Home / Self Care | Payer: Self-pay | Source: Ambulatory Visit | Attending: Orthopaedic Surgery

## 2012-01-27 ENCOUNTER — Encounter (HOSPITAL_COMMUNITY): Payer: Self-pay | Admitting: *Deleted

## 2012-01-27 DIAGNOSIS — M62838 Other muscle spasm: Secondary | ICD-10-CM | POA: Diagnosis not present

## 2012-01-27 DIAGNOSIS — S82202K Unspecified fracture of shaft of left tibia, subsequent encounter for closed fracture with nonunion: Secondary | ICD-10-CM

## 2012-01-27 DIAGNOSIS — K219 Gastro-esophageal reflux disease without esophagitis: Secondary | ICD-10-CM | POA: Diagnosis present

## 2012-01-27 DIAGNOSIS — F17200 Nicotine dependence, unspecified, uncomplicated: Secondary | ICD-10-CM | POA: Diagnosis present

## 2012-01-27 DIAGNOSIS — Z885 Allergy status to narcotic agent status: Secondary | ICD-10-CM

## 2012-01-27 DIAGNOSIS — F1910 Other psychoactive substance abuse, uncomplicated: Secondary | ICD-10-CM | POA: Diagnosis present

## 2012-01-27 DIAGNOSIS — F909 Attention-deficit hyperactivity disorder, unspecified type: Secondary | ICD-10-CM | POA: Diagnosis present

## 2012-01-27 DIAGNOSIS — Z79899 Other long term (current) drug therapy: Secondary | ICD-10-CM

## 2012-01-27 DIAGNOSIS — B1920 Unspecified viral hepatitis C without hepatic coma: Secondary | ICD-10-CM | POA: Diagnosis present

## 2012-01-27 DIAGNOSIS — G8929 Other chronic pain: Secondary | ICD-10-CM | POA: Diagnosis present

## 2012-01-27 DIAGNOSIS — Z886 Allergy status to analgesic agent status: Secondary | ICD-10-CM

## 2012-01-27 DIAGNOSIS — F3130 Bipolar disorder, current episode depressed, mild or moderate severity, unspecified: Secondary | ICD-10-CM | POA: Diagnosis present

## 2012-01-27 DIAGNOSIS — Z8711 Personal history of peptic ulcer disease: Secondary | ICD-10-CM

## 2012-01-27 DIAGNOSIS — M5137 Other intervertebral disc degeneration, lumbosacral region: Secondary | ICD-10-CM | POA: Diagnosis present

## 2012-01-27 DIAGNOSIS — IMO0002 Reserved for concepts with insufficient information to code with codable children: Principal | ICD-10-CM | POA: Diagnosis present

## 2012-01-27 DIAGNOSIS — M51379 Other intervertebral disc degeneration, lumbosacral region without mention of lumbar back pain or lower extremity pain: Secondary | ICD-10-CM | POA: Diagnosis present

## 2012-01-27 HISTORY — PX: TIBIA IM NAIL INSERTION: SHX2516

## 2012-01-27 HISTORY — DX: Unspecified fracture of shaft of left tibia, subsequent encounter for closed fracture with nonunion: S82.202K

## 2012-01-27 HISTORY — PX: HARDWARE REMOVAL: SHX979

## 2012-01-27 SURGERY — INSERTION, INTRAMEDULLARY ROD, TIBIA
Anesthesia: General | Site: Leg Lower | Laterality: Left | Wound class: Clean

## 2012-01-27 MED ORDER — SODIUM CHLORIDE 0.9 % IV SOLN
INTRAVENOUS | Status: DC
  Administered 2012-01-27: 15:00:00 via INTRAVENOUS

## 2012-01-27 MED ORDER — METHOCARBAMOL 100 MG/ML IJ SOLN
500.0000 mg | Freq: Four times a day (QID) | INTRAVENOUS | Status: DC | PRN
  Administered 2012-01-27: 500 mg via INTRAVENOUS
  Filled 2012-01-27: qty 5

## 2012-01-27 MED ORDER — DIPHENHYDRAMINE HCL 50 MG/ML IJ SOLN: 12.5000 mg | Freq: Four times a day (QID) | INTRAMUSCULAR | Status: DC | PRN

## 2012-01-27 MED ORDER — MIRTAZAPINE 30 MG PO TABS
30.0000 mg | ORAL_TABLET | Freq: Every day | ORAL | Status: DC
  Administered 2012-01-27 – 2012-01-28 (×2): 30 mg via ORAL
  Filled 2012-01-27 (×3): qty 1

## 2012-01-27 MED ORDER — DULOXETINE HCL 30 MG PO CPEP
30.0000 mg | ORAL_CAPSULE | Freq: Every day | ORAL | Status: DC
  Administered 2012-01-27 – 2012-01-28 (×2): 30 mg via ORAL
  Filled 2012-01-27 (×3): qty 1

## 2012-01-27 MED ORDER — OXYCODONE HCL 5 MG PO TABS
5.0000 mg | ORAL_TABLET | ORAL | Status: DC | PRN
Start: 2012-01-27 — End: 2012-01-29
  Administered 2012-01-27 – 2012-01-29 (×13): 10 mg via ORAL
  Filled 2012-01-27 (×13): qty 2

## 2012-01-27 MED ORDER — CEFAZOLIN SODIUM-DEXTROSE 2-3 GM-% IV SOLR
2.0000 g | INTRAVENOUS | Status: AC
  Administered 2012-01-27: 2 g via INTRAVENOUS

## 2012-01-27 MED ORDER — LORAZEPAM 1 MG PO TABS
1.0000 mg | ORAL_TABLET | Freq: Four times a day (QID) | ORAL | Status: DC | PRN
  Administered 2012-01-28 – 2012-01-29 (×2): 1 mg via ORAL
  Filled 2012-01-27 (×2): qty 1

## 2012-01-27 MED ORDER — KETAMINE HCL 10 MG/ML IJ SOLN
INTRAMUSCULAR | Status: DC | PRN
  Administered 2012-01-27: 30 mg via INTRAVENOUS
  Administered 2012-01-27 (×2): 10 mg via INTRAVENOUS

## 2012-01-27 MED ORDER — STERILE WATER FOR IRRIGATION IR SOLN
Status: DC | PRN
  Administered 2012-01-27: 3000 mL

## 2012-01-27 MED ORDER — METOCLOPRAMIDE HCL 5 MG/ML IJ SOLN: 5.0000 mg | Freq: Three times a day (TID) | INTRAMUSCULAR | Status: DC | PRN

## 2012-01-27 MED ORDER — ZOLPIDEM TARTRATE 5 MG PO TABS: 5.0000 mg | ORAL_TABLET | Freq: Every evening | ORAL | Status: DC | PRN

## 2012-01-27 MED ORDER — HYDROMORPHONE 0.3 MG/ML IV SOLN
INTRAVENOUS | Status: DC
  Administered 2012-01-27: 4.8 mg via INTRAVENOUS
  Administered 2012-01-27: 0.9 mg via INTRAVENOUS
  Administered 2012-01-27 (×2): via INTRAVENOUS
  Administered 2012-01-28: 3.3 mg via INTRAVENOUS
  Administered 2012-01-28: 06:00:00 via INTRAVENOUS
  Filled 2012-01-27 (×2): qty 25

## 2012-01-27 MED ORDER — ONDANSETRON HCL 4 MG/2ML IJ SOLN: 4.0000 mg | Freq: Four times a day (QID) | INTRAMUSCULAR | Status: DC | PRN

## 2012-01-27 MED ORDER — SODIUM CHLORIDE 0.9 % IJ SOLN: 9.0000 mL | INTRAMUSCULAR | Status: DC | PRN

## 2012-01-27 MED ORDER — PNEUMOCOCCAL VAC POLYVALENT 25 MCG/0.5ML IJ INJ
0.5000 mL | INJECTION | INTRAMUSCULAR | Status: AC
  Administered 2012-01-28: 0.5 mL via INTRAMUSCULAR
  Filled 2012-01-27 (×2): qty 0.5

## 2012-01-27 MED ORDER — SUFENTANIL CITRATE 50 MCG/ML IV SOLN
INTRAVENOUS | Status: DC | PRN
  Administered 2012-01-27 (×2): 10 ug via INTRAVENOUS
  Administered 2012-01-27: 20 ug via INTRAVENOUS
  Administered 2012-01-27: 10 ug via INTRAVENOUS

## 2012-01-27 MED ORDER — NICOTINE 21 MG/24HR TD PT24
21.0000 mg | MEDICATED_PATCH | Freq: Every day | TRANSDERMAL | Status: DC
  Administered 2012-01-27 – 2012-01-28 (×2): 21 mg via TRANSDERMAL
  Filled 2012-01-27 (×3): qty 1

## 2012-01-27 MED ORDER — LACTATED RINGERS IV SOLN
INTRAVENOUS | Status: DC | PRN
  Administered 2012-01-27 (×2): via INTRAVENOUS

## 2012-01-27 MED ORDER — ONDANSETRON HCL 4 MG PO TABS
4.0000 mg | ORAL_TABLET | Freq: Four times a day (QID) | ORAL | Status: DC | PRN
  Administered 2012-01-27: 4 mg via ORAL
  Filled 2012-01-27: qty 1

## 2012-01-27 MED ORDER — LIDOCAINE HCL 4 % MT SOLN
OROMUCOSAL | Status: DC | PRN
  Administered 2012-01-27: 4 mL via TOPICAL

## 2012-01-27 MED ORDER — CEFAZOLIN SODIUM 1-5 GM-% IV SOLN
1.0000 g | Freq: Four times a day (QID) | INTRAVENOUS | Status: AC
  Administered 2012-01-27 – 2012-01-28 (×3): 1 g via INTRAVENOUS
  Filled 2012-01-27 (×3): qty 50

## 2012-01-27 MED ORDER — HYDROMORPHONE HCL PF 1 MG/ML IJ SOLN
0.2500 mg | INTRAMUSCULAR | Status: DC | PRN
  Administered 2012-01-27 (×2): 0.5 mg via INTRAVENOUS

## 2012-01-27 MED ORDER — DEXAMETHASONE SODIUM PHOSPHATE 10 MG/ML IJ SOLN
INTRAMUSCULAR | Status: DC | PRN
  Administered 2012-01-27: 10 mg via INTRAVENOUS

## 2012-01-27 MED ORDER — MIDAZOLAM HCL 5 MG/5ML IJ SOLN
INTRAMUSCULAR | Status: DC | PRN
  Administered 2012-01-27: 2 mg via INTRAVENOUS

## 2012-01-27 MED ORDER — SUCCINYLCHOLINE CHLORIDE 20 MG/ML IJ SOLN
INTRAMUSCULAR | Status: DC | PRN
  Administered 2012-01-27: 80 mg via INTRAVENOUS

## 2012-01-27 MED ORDER — QUETIAPINE FUMARATE ER 50 MG PO TB24
150.0000 mg | ORAL_TABLET | Freq: Every day | ORAL | Status: DC
  Administered 2012-01-27 – 2012-01-28 (×2): 150 mg via ORAL
  Filled 2012-01-27 (×3): qty 3

## 2012-01-27 MED ORDER — DOCUSATE SODIUM 100 MG PO CAPS
100.0000 mg | ORAL_CAPSULE | Freq: Two times a day (BID) | ORAL | Status: DC
  Administered 2012-01-27 – 2012-01-29 (×5): 100 mg via ORAL

## 2012-01-27 MED ORDER — LIDOCAINE HCL (CARDIAC) 20 MG/ML IV SOLN
INTRAVENOUS | Status: DC | PRN
  Administered 2012-01-27: 50 mg via INTRAVENOUS

## 2012-01-27 MED ORDER — BUPIVACAINE HCL 0.25 % IJ SOLN
INTRAMUSCULAR | Status: DC | PRN
  Administered 2012-01-27: 20 mL

## 2012-01-27 MED ORDER — LORAZEPAM 2 MG/ML IJ SOLN: 1.0000 mg | Freq: Four times a day (QID) | INTRAMUSCULAR | Status: DC | PRN

## 2012-01-27 MED ORDER — INFLUENZA VIRUS VACC SPLIT PF IM SUSP
0.5000 mL | INTRAMUSCULAR | Status: AC
  Administered 2012-01-28: 0.5 mL via INTRAMUSCULAR
  Filled 2012-01-27 (×2): qty 0.5

## 2012-01-27 MED ORDER — 0.9 % SODIUM CHLORIDE (POUR BTL) OPTIME
TOPICAL | Status: DC | PRN
  Administered 2012-01-27: 1000 mL

## 2012-01-27 MED ORDER — METOCLOPRAMIDE HCL 10 MG PO TABS: 5.0000 mg | ORAL_TABLET | Freq: Three times a day (TID) | ORAL | Status: DC | PRN

## 2012-01-27 MED ORDER — DIPHENHYDRAMINE HCL 12.5 MG/5ML PO ELIX: 12.5000 mg | ORAL_SOLUTION | ORAL | Status: DC | PRN

## 2012-01-27 MED ORDER — HYDROMORPHONE HCL PF 1 MG/ML IJ SOLN
INTRAMUSCULAR | Status: DC | PRN
  Administered 2012-01-27 (×2): .6 mg via INTRAVENOUS

## 2012-01-27 MED ORDER — BIOTENE DRY MOUTH MT LIQD
15.0000 mL | Freq: Two times a day (BID) | OROMUCOSAL | Status: DC
  Administered 2012-01-27 – 2012-01-28 (×3): 15 mL via OROMUCOSAL

## 2012-01-27 MED ORDER — PROPOFOL 10 MG/ML IV BOLUS
INTRAVENOUS | Status: DC | PRN
  Administered 2012-01-27: 200 mg via INTRAVENOUS

## 2012-01-27 MED ORDER — DIPHENHYDRAMINE HCL 12.5 MG/5ML PO ELIX: 12.5000 mg | ORAL_SOLUTION | Freq: Four times a day (QID) | ORAL | Status: DC | PRN

## 2012-01-27 MED ORDER — LACTATED RINGERS IV SOLN: INTRAVENOUS | Status: DC

## 2012-01-27 MED ORDER — ONDANSETRON HCL 4 MG/2ML IJ SOLN
INTRAMUSCULAR | Status: DC | PRN
  Administered 2012-01-27: 4 mg via INTRAVENOUS

## 2012-01-27 MED ORDER — NALOXONE HCL 0.4 MG/ML IJ SOLN: 0.4000 mg | INTRAMUSCULAR | Status: DC | PRN

## 2012-01-27 MED ORDER — METHOCARBAMOL 500 MG PO TABS
500.0000 mg | ORAL_TABLET | Freq: Four times a day (QID) | ORAL | Status: DC | PRN
  Administered 2012-01-28 – 2012-01-29 (×6): 500 mg via ORAL
  Filled 2012-01-27 (×6): qty 1

## 2012-01-27 SURGICAL SUPPLY — 74 items
BAG SPEC THK2 15X12 ZIP CLS (MISCELLANEOUS) ×2
BAG ZIPLOCK 12X15 (MISCELLANEOUS) ×3 IMPLANT
BANDAGE ELASTIC 4 VELCRO ST LF (GAUZE/BANDAGES/DRESSINGS) ×3 IMPLANT
BANDAGE ELASTIC 6 VELCRO ST LF (GAUZE/BANDAGES/DRESSINGS) ×3 IMPLANT
BANDAGE ESMARK 6X9 LF (GAUZE/BANDAGES/DRESSINGS) ×2 IMPLANT
BIT DRILL LONG 4.0 (BIT) ×2 IMPLANT
BIT DRILL SHORT 4.0 (BIT) ×2 IMPLANT
BLADE SURG 15 STRL LF DISP TIS (BLADE) ×2 IMPLANT
BLADE SURG 15 STRL SS (BLADE) ×3
BNDG CMPR 9X6 STRL LF SNTH (GAUZE/BANDAGES/DRESSINGS) ×2
BNDG ESMARK 6X9 LF (GAUZE/BANDAGES/DRESSINGS) ×3
CLOTH BEACON ORANGE TIMEOUT ST (SAFETY) ×3 IMPLANT
COVER MAYO STAND STRL (DRAPES) ×3 IMPLANT
CUFF TOURN SGL QUICK 34 (TOURNIQUET CUFF) ×3
CUFF TRNQT CYL 34X4X40X1 (TOURNIQUET CUFF) ×2 IMPLANT
DRAPE C-ARM 42X72 X-RAY (DRAPES) ×3 IMPLANT
DRAPE C-ARMOR (DRAPES) ×3 IMPLANT
DRAPE EXTREMITY T 121X128X90 (DRAPE) ×3 IMPLANT
DRAPE POUCH INSTRU U-SHP 10X18 (DRAPES) ×3 IMPLANT
DRAPE STERI IOBAN 125X83 (DRAPES) ×3 IMPLANT
DRAPE U-SHAPE 47X51 STRL (DRAPES) ×3 IMPLANT
DRAPE UTILITY XL STRL (DRAPES) ×6 IMPLANT
DRILL BIT LONG 4.0 (BIT) ×3
DRILL BIT SHORT 4.0 (BIT) ×3
DRSG PAD ABDOMINAL 8X10 ST (GAUZE/BANDAGES/DRESSINGS) ×3 IMPLANT
DRSG XEROFORM 1X8 (GAUZE/BANDAGES/DRESSINGS) ×6 IMPLANT
DURAPREP 26ML APPLICATOR (WOUND CARE) ×6 IMPLANT
ELECT REM PT RETURN 9FT ADLT (ELECTROSURGICAL) ×3
ELECTRODE REM PT RTRN 9FT ADLT (ELECTROSURGICAL) ×2 IMPLANT
GAUZE XEROFORM 1X8 LF (GAUZE/BANDAGES/DRESSINGS) ×3 IMPLANT
GLOVE BIO SURGEON STRL SZ7 (GLOVE) ×3 IMPLANT
GLOVE BIO SURGEON STRL SZ7.5 (GLOVE) ×6 IMPLANT
GLOVE BIOGEL PI IND STRL 7.5 (GLOVE) ×2 IMPLANT
GLOVE BIOGEL PI IND STRL 8 (GLOVE) ×2 IMPLANT
GLOVE BIOGEL PI INDICATOR 7.5 (GLOVE) ×1
GLOVE BIOGEL PI INDICATOR 8 (GLOVE) ×1
GOWN STRL NON-REIN LRG LVL3 (GOWN DISPOSABLE) ×3 IMPLANT
GOWN STRL REIN XL XLG (GOWN DISPOSABLE) ×6 IMPLANT
GUIDE PIN 3.2MM (MISCELLANEOUS) ×3
GUIDE PIN ORTH 343X3.2XBRAD (MISCELLANEOUS) ×2 IMPLANT
GUIDE ROD 3.0 (MISCELLANEOUS) ×3
KIT BASIN OR (CUSTOM PROCEDURE TRAY) ×3 IMPLANT
MANIFOLD NEPTUNE II (INSTRUMENTS) ×3 IMPLANT
NAIL TIBIAL 11.5X31 TIBIA (Nail) ×3 IMPLANT
NEEDLE HYPO 22GX1.5 SAFETY (NEEDLE) ×3 IMPLANT
NS IRRIG 1000ML POUR BTL (IV SOLUTION) ×3 IMPLANT
PACK TOTAL JOINT (CUSTOM PROCEDURE TRAY) ×3 IMPLANT
PAD CAST 4YDX4 CTTN HI CHSV (CAST SUPPLIES) ×4 IMPLANT
PADDING CAST ABS 4INX4YD NS (CAST SUPPLIES) ×1
PADDING CAST ABS 6INX4YD NS (CAST SUPPLIES) ×1
PADDING CAST ABS COTTON 4X4 ST (CAST SUPPLIES) ×2 IMPLANT
PADDING CAST ABS COTTON 6X4 NS (CAST SUPPLIES) ×2 IMPLANT
PADDING CAST COTTON 4X4 STRL (CAST SUPPLIES) ×6
PADDING CAST COTTON 6X4 STRL (CAST SUPPLIES) ×3 IMPLANT
POSITIONER SURGICAL ARM (MISCELLANEOUS) ×3 IMPLANT
ROD GUIDE 3.0 (MISCELLANEOUS) ×2 IMPLANT
SCREW TRIGEN LOW PROF 5.0X32.5 (Screw) ×3 IMPLANT
SCREW TRIGEN LOW PROF 5.0X45 (Screw) ×3 IMPLANT
SCREW TRIGEN LOW PROF 5.0X60 (Screw) ×3 IMPLANT
SPONGE GAUZE 4X4 12PLY (GAUZE/BANDAGES/DRESSINGS) ×3 IMPLANT
STAPLER SKIN PROX WIDE 3.9 (STAPLE) ×3 IMPLANT
STAPLER VISISTAT 35W (STAPLE) IMPLANT
STRIP CLOSURE SKIN 1/2X4 (GAUZE/BANDAGES/DRESSINGS) IMPLANT
SUT MNCRL AB 4-0 PS2 18 (SUTURE) IMPLANT
SUT VIC AB 0 CT1 27 (SUTURE) ×12
SUT VIC AB 0 CT1 27XBRD ANTBC (SUTURE) ×8 IMPLANT
SUT VIC AB 1 CT1 27 (SUTURE) ×3
SUT VIC AB 1 CT1 27XBRD ANTBC (SUTURE) ×2 IMPLANT
SUT VIC AB 2-0 CT1 27 (SUTURE) ×6
SUT VIC AB 2-0 CT1 TAPERPNT 27 (SUTURE) ×4 IMPLANT
SYR 20CC LL (SYRINGE) ×3 IMPLANT
TOWEL OR 17X26 10 PK STRL BLUE (TOWEL DISPOSABLE) ×6 IMPLANT
WATER STERILE IRR 1500ML POUR (IV SOLUTION) ×3 IMPLANT
tibial nail 11.5mm x 31cm ×3 IMPLANT

## 2012-01-27 NOTE — Anesthesia Postprocedure Evaluation (Signed)
  Anesthesia Post-op Note  Patient: Joshua Thomas  Procedure(s) Performed: Procedure(s) (LRB): INTRAMEDULLARY (IM) NAIL TIBIAL (Left) HARDWARE REMOVAL (Left)  Patient Location: PACU  Anesthesia Type: General  Level of Consciousness: awake and alert   Airway and Oxygen Therapy: Patient Spontanous Breathing  Post-op Pain: mild  Post-op Assessment: Post-op Vital signs reviewed, Patient's Cardiovascular Status Stable, Respiratory Function Stable, Patent Airway and No signs of Nausea or vomiting  Last Vitals:  Filed Vitals:   01/27/12 1245  BP: 146/92  Pulse: 108  Temp: 36.8 C  Resp: 12    Post-op Vital Signs: stable   Complications: No apparent anesthesia complications

## 2012-01-27 NOTE — H&P (Signed)
Joshua Thomas is an 37 y.o. male.   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  Had an IM nail/rod placed in [facility]. 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  He now presents for exchange nailing in an attempt to get boney healing.  He knows that smoking does contribute to this and it has been discuss that he stop smoking.  Past Medical History  Diagnosis Date  . Depression   . Bipolar 1 disorder   . ADHD (attention deficit hyperactivity disorder)   . Hepatitis C   . Chronic pain     chronic l leg pain  . Sciatica   . Degenerative disc disease, lumbar   . GERD (gastroesophageal reflux disease)   . History of stomach ulcers     Past Surgical History  Procedure Date  . Fracture surgery     Family History  Problem Relation Age of Onset  . Diabetes Mother    Social History:  reports that he has been smoking Cigarettes.  He has a 24 pack-year smoking history. He quit smokeless tobacco use about 5 years ago. He reports that he drinks alcohol. He reports that he does not use illicit drugs.  Allergies:  Allergies  Allergen Reactions  . Acetaminophen Other (See Comments)    Due to Ulcers/ Liver Damage  . Morphine And Related Itching    Medications Prior to Admission  Medication Sig Dispense Refill  . Cyanocobalamin (VITAMIN B-12 PO) Take 1 capsule by mouth daily.       . DULoxetine (CYMBALTA) 30 MG capsule Take 30 mg by mouth at bedtime.      . mirtazapine (REMERON) 30 MG tablet Take 30 mg by mouth at bedtime.      Marland Kitchen oxycodone (OXY-IR) 5 MG capsule Take 5 mg by mouth every 4 (four) hours as needed. For pain      . QUEtiapine Fumarate (SEROQUEL XR) 150 MG 24 hr tablet Take 150 mg by mouth at bedtime.        No results found for this or any previous visit (from the past 48 hour(s)). Joshua Thomas  01/26/2012  *RADIOLOGY REPORT*  Clinical Data: Chronic  back and left leg pain.  MRI LUMBAR SPINE WITHOUT Thomas  Technique:  Multiplanar and multiecho pulse sequences of the lumbar spine were obtained without intravenous Thomas.  Comparison: CT scan 01/03/2012.  Findings: The sagittal Joshua images demonstrate normal alignment of the lumbar vertebral bodies.  There are moderate degenerative changes of the lower thoracic spine and upper lumbar spine with disc space narrowing and osteophytic spurring.  There is a compression deformity of the L1 vertebral body with a large anterior superior Schmorl's node.  Suspect chronic or subacute compression fracture.  No retropulsion or canal compromise.  There are mild facet degenerative changes but no pars defects.  No significant paraspinal or retroperitoneal process is identified.  L1-2:  Shallow broad-based bulging annulus with mild lateral recess encroachment on the left.  No spinal or significant foraminal stenosis.  L2-3:  Shallow broad-based extra foraminal and far lateral disc protrusion on the left appears to contact and displace the left L2 nerve root.  L3-4:  Diffuse bulging annulus but no spinal stenosis.  Very generous spinal canal.  There is an annular rent and a shallow broad-based extra foraminal and far lateral disc protrusion on  the left potentially irritating the left L3 nerve root.  L4-5:  Bilateral extraforaminal disc protrusions likely irritating both L4 nerve roots.  No spinal or lateral recess stenosis.  L5-S1:  Mild degenerative disease with a bulging annulus and osteophytic ridging.  Minimal left foraminal encroachment.  IMPRESSION:  1.  Multilevel disc disease and facet disease.  The spinal canal is generous and is no significant spinal or lateral recess stenosis. 2.  Remote or subacute compression fracture of L1 without retropulsion or canal compromise. 3.  Degenerative changes in the lower thoracic spine and upper lumbar spine. 4.  Foraminal/extraforaminal disc protrusions at L2-3, L3-4 and L4- 5.    Original Report Authenticated By: Joshua Thomas, M.D.     Review of Systems  All other systems reviewed and are negative.    Blood pressure 132/90, pulse 90, temperature 97.6 F (36.4 C), resp. rate 20, SpO2 98.00%. Physical Exam  Constitutional: He is oriented to person, place, and time. He appears well-developed and well-nourished.  HENT:  Head: Normocephalic and atraumatic.  Eyes: EOM are normal. Pupils are equal, round, and reactive to light.  Neck: Normal range of motion. Neck supple.  Cardiovascular: Normal rate and regular rhythm.   Respiratory: Effort normal and breath sounds normal.  GI: Soft. Bowel sounds are normal.  Musculoskeletal:       Left lower leg: He exhibits bony tenderness.       Legs: Neurological: He is alert and oriented to person, place, and time.  Skin: Skin is warm and dry.  Psychiatric: He has a normal mood and affect.     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

## 2012-01-27 NOTE — Op Note (Signed)
NAMEQUINCE, SANTANA NO.:  [id number]  MEDICAL RECORD NO.:  [id number]  LOCATION:  1613                         FACILITY:  [facility]  PHYSICIAN:  Vanita Panda. Magnus Ivan, M.D.DATE OF BIRTH:  12/30/1975  DATE OF PROCEDURE:  01/27/2012 DATE OF DISCHARGE:                              OPERATIVE REPORT   PREOPERATIVE DIAGNOSIS:  Left distal third tibia fracture nonunion, status post intramedullary nail.  POSTOPERATIVE DIAGNOSIS:  Left distal third tibia fracture nonunion, status post intramedullary nail.  PROCEDURE:  Exchange nailing of left tibia fracture nonunion.  IMPLANTS:  [employer]hew 11.5, size 31 tibial nail with one proximal and two distal interlocks (removed by [employer] 10 x 33 tibial nail).  SURGEON:  Kathryne Hitch, MD.  ANESTHESIA: 1. General. 2. Local.  ANTIBIOTICS:  IV Ancef 2 g.  BLOOD LOSS:  Less than 200 mL.  COMPLICATIONS:  None.  INDICATIONS:  The patient is a 37 year old gentleman, who in June of last year fell off roof and was working on and sustained distal third tib-fib fracture.  He was taken to the operating room and the intramedullary nail was placed.  He subsequently has gone on to nonunion, he continued hip pain in his distal tibia.  He did not seek treatment back in [facility] because he is not from that area and is ready for showing up to our emergency room with complaints of pain.  X- rays were obtained and showed a nonunion.  He did follow up in my office as I was on call.  A CT scan was obtained and it showed that was performed at the tibia and it did show a left tibia fracture nonunion. We recommended exchange nailing with reaming bulbous size on nails were competent and get the healing was also counseled about smoking because he is a quite a significant smoker he also has hepatitis C all this can contribute to delayed union or nonunion.  We explained the risks and benefits of the surgery in detail and he did  wish to proceed with surgery.  PROCEDURE DESCRIPTION:  After informed consent was obtained, appropriate left leg was marked.  He was brought to the operating room and placed supine on the operating table.  General anesthesia was then obtained.  A nonsterile tourniquet placed around his upper left thigh.  It was not utilized during the case.  His left thigh, leg, ankle, and toes were all prepped and draped with DuraPrep and sterile drapes.  Time-out was called identifying the correct patient, correct left leg.  I then first made a small incisions over his previous incisions ankle inverted and removed the three distal interlocks without complications.  I then went more proximal to the two proximal interlocks and removed the medial wound first, then I made an incision over the knee and dissected down to the patella tendon to the knee joint itself.  Under direct fluoroscopic guidance.  I then placed a guide pin into the center position of the previous nail and put the distractor on this.  I then removed both proximal interlocks.  I then backed the whole nail out in its entirety. I then stressed the fracture site under direct  fluoroscopy, you could see that there was a persistent nonunion, but there was no gross motion at the fracture site.  I then placed a long guidewire and then began reaming from size 10 reamer and a 5-mm increments up to a 12.5 Reamer. I then chose a 11.5 x 31 tibial nail and placed this over the guidewire down the tibial canal and moved the guidewire.  I then secured then rod with one proximal and two distal interlocks.  I decided not to do any other supplemental bone grafting based on how the x-rays look with right rod sitting there and the proximal screw in the dynamic slot.  I am confident we had good interval healing.  We will likely try ultrasound bone stimulation as well.  We then copiously irrigated all open wounds with normal saline solution.  I closed the deep  tissue with the knee was #0 Vicryl closing the patella tendon, then 2-0 Vicryl in the paratenon and in the subcutaneous tissue, and staples on all skin incisions, interlock incisions.  We then infiltrated all incisions with 0.25% plain Sensorcaine.  Xeroform followed by a sterile dressing was applied.  The patient was awakened, extubated, and taken to recovery room in stable condition.  All final counts correct.  There were no complications noted.     Vanita Panda. Magnus Ivan, M.D.     CYB/MEDQ  D:  01/27/2012  T:  01/27/2012  Job:  [id number]

## 2012-01-27 NOTE — Transfer of Care (Signed)
Immediate Anesthesia Transfer of Care Note  Patient: Joshua Thomas  Procedure(s) Performed: Procedure(s) (LRB) with comments: INTRAMEDULLARY (IM) NAIL TIBIAL (Left) - Removal of IM Rod and Screws Left Tibia with Exchange Nail, Allograft Bone Graft Left Tibia HARDWARE REMOVAL (Left)  Patient Location: PACU  Anesthesia Type:General  Level of Consciousness: awake, alert  and oriented  Airway & Oxygen Therapy: Patient Spontanous Breathing and Patient connected to face mask oxygen  Post-op Assessment: Report given to PACU RN and Post -op Vital signs reviewed and stable  Post vital signs: Reviewed and stable  Complications: No apparent anesthesia complications

## 2012-01-27 NOTE — Brief Op Note (Signed)
01/27/2012  12:04 PM  PATIENT:  Naida Sleight  37 y.o. male  PRE-OPERATIVE DIAGNOSIS:  Non-union left tibia fracture  POST-OPERATIVE DIAGNOSIS:  Non-union left tibia fracture  PROCEDURE:  Procedure(s) (LRB) with comments: INTRAMEDULLARY (IM) NAIL TIBIAL (Left) - Removal of IM Rod and Screws Left Tibia with Exchange Nail, Allograft Bone Graft Left Tibia HARDWARE REMOVAL (Left)  SURGEON:  Surgeon(s) and Role:    * Kathryne Hitch, MD - Primary  PHYSICIAN ASSISTANT:   ASSISTANTS: none   ANESTHESIA:   local and general  EBL:  Total I/O In: 1000 [I.V.:1000] Out: 175 [Blood:175]  BLOOD ADMINISTERED:none  DRAINS: none   LOCAL MEDICATIONS USED:  MARCAINE     SPECIMEN:  No Specimen and Excision  DISPOSITION OF SPECIMEN:  N/A  COUNTS:  YES  TOURNIQUET:  * No tourniquets in log *  DICTATION: .Other Dictation: Dictation Number [id number]  PLAN OF CARE: Admit to inpatient   PATIENT DISPOSITION:  PACU - hemodynamically stable.   Delay start of Pharmacological VTE agent (>24hrs) due to surgical blood loss or risk of bleeding: no

## 2012-01-27 NOTE — Anesthesia Procedure Notes (Addendum)
Procedure Name: Intubation Date/Time: 01/27/2012 10:16 AM Performed by: Leroy Libman L Patient Re-evaluated:Patient Re-evaluated prior to inductionOxygen Delivery Method: Circle system utilized Preoxygenation: Pre-oxygenation with 100% oxygen Intubation Type: IV induction Ventilation: Mask ventilation without difficulty and Oral airway inserted - appropriate to patient size Laryngoscope Size: Miller and 3 Grade View: Grade I Tube size: 8.0 mm Number of attempts: 1 Airway Equipment and Method: Patient positioned with wedge pillow and LTA kit utilized Placement Confirmation: ETT inserted through vocal cords under direct vision,  breath sounds checked- equal and bilateral and positive ETCO2 Secured at: 21 cm Tube secured with: Tape Dental Injury: Teeth and Oropharynx as per pre-operative assessment

## 2012-01-27 NOTE — Preoperative (Signed)
Beta Blockers   Reason not to administer Beta Blockers:Not Applicable 

## 2012-01-28 MED ORDER — OXYCODONE HCL 5 MG PO CAPS: 5.0000 mg | ORAL_CAPSULE | ORAL | Status: DC | PRN

## 2012-01-28 MED ORDER — HYDROMORPHONE HCL PF 1 MG/ML IJ SOLN
1.0000 mg | INTRAMUSCULAR | Status: DC | PRN
  Administered 2012-01-28 (×3): 1 mg via INTRAVENOUS
  Filled 2012-01-28 (×3): qty 1

## 2012-01-28 NOTE — Discharge Summary (Signed)
Patient ID: Joshua Thomas MRN: [id number] DOB/AGE: Jul 14, 1975 36 y.o.  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 Depression   . Bipolar 1 disorder   . ADHD (attention deficit hyperactivity disorder)   . Hepatitis C   . Chronic pain     chronic l leg pain  . Sciatica   . Degenerative disc disease, lumbar   . GERD (gastroesophageal reflux disease)   . History of stomach ulcers     Surgeries: Procedure(s): INTRAMEDULLARY (IM) NAIL TIBIAL HARDWARE REMOVAL on 01/27/2012   Consultants:    Discharged Condition: Improved  Hospital Course: Joshua Thomas is an 37 y.o. male who was admitted 01/27/2012 for operative treatment of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. Patient has severe unremitting pain that affects sleep, daily activities, and work/hobbies. After pre-op clearance the patient was taken to the operating room on 01/27/2012 and underwent  Procedure(s): INTRAMEDULLARY (IM) NAIL TIBIAL HARDWARE REMOVAL.    Patient was given perioperative antibiotics: Anti-infectives     Start     Dose/Rate Route Frequency Ordered Stop   01/27/12 1600   ceFAZolin (ANCEF) IVPB 1 g/50 mL premix        1 g 100 mL/hr over 30 Minutes Intravenous Every 6 hours 01/27/12 1259 01/28/12 0407   01/27/12 0803   ceFAZolin (ANCEF) IVPB 2 g/50 mL premix        2 g 100 mL/hr over 30 Minutes Intravenous On call to O.R. 01/27/12 0803 01/27/12 1020           Patient was given sequential compression devices, early ambulation, and chemoprophylaxis to prevent DVT.  Patient benefited maximally from hospital stay and there were no complications.    Recent vital signs: Patient Vitals for the past 24 hrs:  BP Temp Temp src Pulse Resp SpO2  01/28/12 0626 - - - - 14  96 %  01/28/12 0558 132/82 mmHg 98.2 F (36.8 C)  Oral 104  18  98 %  01/28/12 0338 - - - - 16  94 %  01/28/12 0142 142/90 mmHg 98.3 F (36.8 C) Oral 101  18  98 %  01/27/12 2308 - - - - 18  93 %  01/27/12 2114 123/76 mmHg 98.2 F (36.8 C) Oral 89  19  96 %  01/27/12 1818 149/96 mmHg 98.4 F (36.9 C) Oral 105  16  98 %  01/27/12 1805 - - - - 12  96 %     Recent laboratory studies: No results found for this basename: WBC:2,HGB:2,HCT:2,PLT:2,NA:2,K:2,CL:2,CO2:2,BUN:2,CREATININE:2,GLUCOSE:2,PT:2,INR:2,CALCIUM,2: in the last 72 hours   Discharge Medications:     Medication List     As of 01/28/2012  5:20 PM    TAKE these medications         DULoxetine 30 MG capsule   Commonly known as: CYMBALTA   Take 30 mg by mouth at bedtime.      mirtazapine 30 MG tablet   Commonly known as: REMERON   Take 30 mg by mouth at bedtime.      oxycodone 5 MG capsule   Commonly known as: OXY-IR   Take 1 capsule (5 mg total) by mouth every 4 (four) hours as needed for pain. For pain      SEROQUEL XR 150 MG 24 hr tablet   Generic drug: QUEtiapine Fumarate  Take 150 mg by mouth at bedtime.      VITAMIN B-12 PO   Take 1 capsule by mouth daily.        Diagnostic Studies: Ct Abdomen Pelvis Wo Contrast  01/03/2012  *RADIOLOGY REPORT*  Clinical Data: Right flank pain, history of kidney stones  CT ABDOMEN AND PELVIS WITHOUT CONTRAST  Technique:  Multidetector CT imaging of the abdomen and pelvis was performed following the standard protocol without intravenous contrast.  Comparison: [facility] CT abdomen pelvis dated 01/02/2011.  Findings: Lung bases are clear.  Unenhanced liver, pancreas, and adrenal glands are within normal limits.  Suspected embolization clips in the spleen (series 2/image 25), new.  Gallbladder is unremarkable.  No intrahepatic or extrahepatic ductal dilatation.  Multiple nonobstructing right renal calculi measuring up to 3 mm in the right upper pole (image 86).  Possible punctate nonobstructing left lower pole renal calculus  (coronal image 78).  No hydronephrosis.  No evidence of bowel obstruction.  Normal appendix.  No evidence of abdominal aortic aneurysm.  No abdominopelvic ascites.  No suspicious abdominopelvic lymphadenopathy.  Prostate is unremarkable.  No ureteral or bladder calculi.  Bladder is within normal limits.  Superior endplate changes at L1 (sagittal image 24), likely old although new from 2012, possibly post-traumatic.  No retropulsion.  IMPRESSION: Multiple nonobstructing renal calculi measuring to 2 mm in the right upper pole.  No ureteral or bladder calculi.  No hydronephrosis.  Normal appendix.   Original Report Authenticated By: Charline Bills, M.D.    Dg Tibia/fibula Left  01/27/2012  *RADIOLOGY REPORT*  Clinical Data: Intermedullary rod.  LEFT TIBIA AND FIBULA - 2 VIEW  Comparison: 10/09/2011  Findings: Intermedullary rod crosses the chronic tibial shaft fracture.  Fracture line remains evident.  Single proximal and 2 distal interlocking  screws noted.  Chronic fibular fracture also again noted.  IMPRESSION: Internal fixation as above.  No complicating feature.   Original Report Authenticated By: Charlett Nose, M.D.    [name]  01/26/2012  *RADIOLOGY REPORT*  Clinical Data: Chronic back and left leg pain.  MRI LUMBAR SPINE WITHOUT CONTRAST  Technique:  Multiplanar and multiecho pulse sequences of the lumbar spine were obtained without intravenous contrast.  Comparison: CT scan 01/03/2012.  Findings: The sagittal MR images demonstrate normal alignment of the lumbar vertebral bodies.  There are moderate degenerative changes of the lower thoracic spine and upper lumbar spine with disc space narrowing and osteophytic spurring.  There is a compression deformity of the L1 vertebral body with a large anterior superior Schmorl's node.  Suspect chronic or subacute compression fracture.  No retropulsion or canal compromise.  There are mild facet degenerative changes but no pars defects.  No  significant paraspinal or retroperitoneal process is identified.  L1-2:  Shallow broad-based bulging annulus with mild lateral recess encroachment on the left.  No spinal or significant foraminal stenosis.  L2-3:  Shallow broad-based extra foraminal and far lateral disc protrusion on the left appears to contact and displace the left L2 nerve root.  L3-4:  Diffuse bulging annulus but no spinal stenosis.  Very generous spinal canal.  There is an annular rent and a shallow broad-based extra foraminal and far lateral disc protrusion on the left potentially irritating the left L3 nerve root.  L4-5:  Bilateral extraforaminal disc protrusions likely irritating both L4 nerve roots.  No spinal or lateral recess stenosis.  L5-S1:  Mild degenerative disease with a bulging annulus and osteophytic ridging.  Minimal left foraminal  encroachment.  IMPRESSION:  1.  Multilevel disc disease and facet disease.  The spinal canal is generous and is no significant spinal or lateral recess stenosis. 2.  Remote or subacute compression fracture of L1 without retropulsion or canal compromise. 3.  Degenerative changes in the lower thoracic spine and upper lumbar spine. 4.  Foraminal/extraforaminal disc protrusions at L2-3, L3-4 and L4- 5.   Original Report Authenticated By: Rudie Meyer, M.D.    Dg C-arm 1-60 Min-no Report  01/27/2012  CLINICAL DATA: tibia im nail removal and insertion   C-ARM 1-60 MINUTES  Fluoroscopy was utilized by the requesting physician.  No radiographic  interpretation.      Disposition: 01-Home or Self Care      Discharge Orders    Future Orders Please Complete By Expires   Diet - low sodium heart healthy      Call MD / Call 911      Comments:   If you experience chest pain or shortness of breath, CALL 911 and be transported to the hospital emergency room.  If you develope a fever above 101 F, pus (white drainage) or increased drainage or redness at the wound, or calf pain, call your surgeon's office.    Constipation Prevention      Comments:   Drink plenty of fluids.  Prune juice may be helpful.  You may use a stool softener, such as Colace (over the counter) 100 mg twice a day.  Use MiraLax (over the counter) for constipation as needed.   Increase activity slowly as tolerated      Discharge instructions      Comments:   Increase your activites as comfort allows. You can get your incisions wet in the shower and place new dry dressing daily.   Discharge patient         Follow-up Information    Follow up with Kathryne Hitch, MD. In 2 weeks.   Contact information:   [address] [place] [address] [phone]           Signed: Kathryne Hitch 01/28/2012, 5:20 PM

## 2012-01-28 NOTE — Progress Notes (Signed)
Subjective: 1 Day Post-Op Procedure(s) (LRB): INTRAMEDULLARY (IM) NAIL TIBIAL (Left) HARDWARE REMOVAL (Left) Patient reports pain as severe.  Unfortunately he has been abusing narcotics prior to admission.  Objective: Vital signs in last 24 hours: Temp:  [97.8 F (36.6 C)-98.7 F (37.1 C)] 98.2 F (36.8 C) (01/17 0558) Pulse Rate:  [89-111] 104  (01/17 0558) Resp:  [11-35] 14  (01/17 0626) BP: (123-172)/(76-111) 132/82 mmHg (01/17 0558) SpO2:  [93 %-100 %] 96 % (01/17 0626) Weight:  [72.122 kg (159 lb)] 72.122 kg (159 lb) (01/16 1259)  Intake/Output from previous day: 01/16 0701 - 01/17 0700 In: 4517.5 [P.O.:1440; I.V.:2977.5; IV Piggyback:100] Out: 3400 [Urine:3225; Blood:175] Intake/Output this shift:    No results found for this basename: HGB:5 in the last 72 hours No results found for this basename: WBC:2,RBC:2,HCT:2,PLT:2 in the last 72 hours No results found for this basename: NA:2,K:2,CL:2,CO2:2,BUN:2,CREATININE:2,GLUCOSE:2,CALCIUM:2 in the last 72 hours No results found for this basename: LABPT:2,INR:2 in the last 72 hours  Sensation intact distally Intact pulses distally Dorsiflexion/Plantar flexion intact Incision: scant drainage No cellulitis present Compartment soft  Assessment/Plan: 1 Day Post-Op Procedure(s) (LRB): INTRAMEDULLARY (IM) NAIL TIBIAL (Left) HARDWARE REMOVAL (Left) Up with therapy Plan for discharge tomorrow  Joshua Thomas 01/28/2012, 9:39 AM

## 2012-01-28 NOTE — Care Management Note (Unsigned)
    Page 1 of 1   01/28/2012     2:42:16 PM   CARE MANAGEMENT NOTE 01/28/2012  Patient:  Joshua Thomas, Joshua Thomas   Account Number:  [id number]  Date Initiated:  01/28/2012  Documentation initiated by:  Colleen Can  Subjective/Objective Assessment:   DX Left IM nail hardware removal.  Worker's comp claim #[id number]  DOI 05/26/2011  adjuster jill Sammut-[phone]  [employer], [address], [place] , [place] [address]     Action/Plan:   CM spoke with patient. Plans are for him to return to his home in [facility] where he will have support from his family. He will need RW. No HH services needed   Anticipated DC Date:  01/29/2012   Anticipated DC Plan:  HOME/SELF CARE  In-house referral  NA      DC Planning Services  CM consult      PAC Choice  DURABLE MEDICAL EQUIPMENT   Choice offered to / List presented to:  NA           Status of service:  In process, will continue to follow Medicare Important Message given?   (If response is "NO", the following Medicare IM given date fields will be blank) Date Medicare IM given:   Date Additional Medicare IM given:    Discharge Disposition:    Per UR Regulation:  Reviewed for med. necessity/level of care/duration of stay  If discussed at Long Length of Stay Meetings, dates discussed:    Comments:  01/28/2012 Colleen Can BSN RN CCM [phone] CM called adj-Jill to advise of DME needs. Requested that I fax dme request of RW to [phone]. States [employer] handles dme for this worker's comp claim. Order faxed with confirmation. Advised that RW will need to be delivered to hospital.CM to follow

## 2012-01-28 NOTE — Evaluation (Signed)
Physical Therapy Evaluation Patient Details Name: Joshua Thomas MRN: [id number] DOB: October 18, 1975 Today's Date: 01/28/2012 Time: 1210-1233 PT Time Calculation (min): 23 min  PT Assessment / Plan / Recommendation Clinical Impression  s/p hardware removal LLE, INTRAMEDULLARY (IM) NAIL TIBIAL (Left); one time eval completed at this time; pt does not feel he has any other needs at this time; pt has used RW and crutches in the past;  pt can verblaize correct techniques but does not always adhere to such; no further PT needs at this time    PT Assessment  Patent does not need any further PT services    Follow Up Recommendations  Outpatient PT (per MD)    Does the patient have the potential to tolerate intense rehabilitation      Barriers to Discharge        Equipment Recommendations  Rolling walker with 5" wheels (short RW)    Recommendations for Other Services     Frequency      Precautions / Restrictions Precautions Precautions: None Restrictions LLE Weight Bearing: Weight bearing as tolerated   Pertinent Vitals/Pain       Mobility  Bed Mobility Bed Mobility: Supine to Sit Supine to Sit: 5: Supervision Transfers Transfers: Sit to Stand;Stand to Sit Sit to Stand: 5: Supervision;6: Modified independent (Device/Increase time) Stand to Sit: 5: Supervision;6: Modified independent (Device/Increase time) Details for Transfer Assistance: pt tends do things his way; although not significantly unsafe while PT present; Ambulation/Gait Ambulation/Gait Assistance: 5: Supervision Ambulation Distance (Feet): 100 Feet (15') Assistive device: Rolling walker;Crutches Ambulation/Gait Assistance Details: cues to atempt putting more wt on LLE Gait Pattern: Step-to pattern General Gait Details: pt amb on toes of left foot or does not put any wt at all due to pain Stairs Assistance: 5: Supervision;4: Min guard Stairs Assistance Details (indicate cue type and reason): cues for sequence    Stair Management Technique: Forwards;With crutches Number of Stairs: 3     Shoulder Instructions     Exercises     PT Diagnosis:    PT Problem List:   PT Treatment Interventions:     PT Goals    Visit Information  Last PT Received On: 01/28/12 Assistance Needed: +1    Subjective Data  Subjective: my wife is in the ER with a migaraine Patient Stated Goal: home   Prior Functioning  Home Living Lives With: Spouse Available Help at Discharge: Family Type of Home: House Home Access: Stairs to enter Secretary/administrator of Steps: 4-5 Entrance Stairs-Rails: None Home Layout: One level Home Adaptive Equipment: Crutches Additional Comments: pt reports he was hopping and walking with a limp prior to this surgery Prior Function Level of Independence: Independent Able to Take Stairs?: Yes Communication Communication: No difficulties    Cognition  Overall Cognitive Status: Appears within functional limits for tasks assessed/performed Arousal/Alertness: Awake/alert Orientation Level: Appears intact for tasks assessed Behavior During Session: [facility] for tasks performed    Extremity/Trunk Assessment Right Upper Extremity Assessment RUE ROM/Strength/Tone: [facility] for tasks assessed Left Upper Extremity Assessment LUE ROM/Strength/Tone: [facility] for tasks assessed Right Lower Extremity Assessment RLE ROM/Strength/Tone: [facility] for tasks assessed Left Lower Extremity Assessment LLE ROM/Strength/Tone: Deficits;Unable to fully assess;Due to pain LLE ROM/Strength/Tone Deficits: diffuse musle atrophy LLE; after mobility able to get to ~15 degrees knee extension; flexion to ~90*; unable to move ankle beyond neutral due to pain and muscle wasting   Balance Static Standing Balance Static Standing - Balance Support: During functional activity;Right upper extremity supported;No upper extremity supported  Static Standing - Level of Assistance: 5: Stand by assistance  End of Session PT - End of  Session Activity Tolerance: Patient tolerated treatment well Patient left: in bed;with call bell/phone within reach;with family/visitor present Nurse Communication: Mobility status  GP     [facility] 01/28/2012, 1:20 PM

## 2012-01-29 NOTE — Progress Notes (Signed)
Pt discharged to family auto via wheelchair after seen by CM. Pt impatient about leaving. Assessment unchanged from am.

## 2012-01-29 NOTE — Progress Notes (Signed)
Called [place] CM to ascertain if pt needs Workmans Comp # listed on chart to obtain meds after discharge. Pt given info re: to call Janeann Forehand [phone] for assist.

## 2012-01-29 NOTE — Care Management (Signed)
Cm spoke with pt concerning dc planning. Per pt unable to afford medications. CM contacted AD for approval for medication assistance. AD to override MATCH program to assist pt with rx. Pt entered into PDMI system, provided with [facility] letter. No other needs stated.   Roxy Manns Shealee Yordy,RN,BSN [phone]

## 2012-01-29 NOTE — Progress Notes (Signed)
Subjective: Pt stable - reports muscle spasm   Objective: Vital signs in last 24 hours: Temp:  [98.9 F (37.2 C)-99.5 F (37.5 C)] 98.9 F (37.2 C) (01/18 0609) Pulse Rate:  [100-113] 100  (01/18 0609) Resp:  [14-16] 14  (01/18 0609) BP: (113-147)/(73-98) 113/73 mmHg (01/18 0609) SpO2:  [95 %-98 %] 95 % (01/18 0609)  Intake/Output from previous day: 01/17 0701 - 01/18 0700 In: 1200 [P.O.:1200] Out: 1350 [Urine:1350] Intake/Output this shift:    Exam:  Neurovascular intact Sensation intact distally Compartment soft  Labs: No results found for this basename: HGB:5 in the last 72 hours No results found for this basename: WBC:2,RBC:2,HCT:2,PLT:2 in the last 72 hours No results found for this basename: NA:2,K:2,CL:2,CO2:2,BUN:2,CREATININE:2,GLUCOSE:2,CALCIUM:2 in the last 72 hours No results found for this basename: LABPT:2,INR:2 in the last 72 hours  Assessment/Plan: Dc today after PT - added rx for muscle relaxer   DEAN,GREGORY SCOTT 01/29/2012, 10:33 AM

## 2012-04-07 ENCOUNTER — Encounter (HOSPITAL_COMMUNITY): Payer: Self-pay | Admitting: Emergency Medicine

## 2012-04-07 ENCOUNTER — Emergency Department (HOSPITAL_COMMUNITY)
Admission: EM | Admit: 2012-04-07 | Discharge: 2012-04-07 | Disposition: A | Discharge status: Home / Self Care | Payer: Self-pay | Attending: Emergency Medicine | Admitting: Emergency Medicine

## 2012-04-07 DIAGNOSIS — M549 Dorsalgia, unspecified: Secondary | ICD-10-CM

## 2012-04-07 DIAGNOSIS — M5137 Other intervertebral disc degeneration, lumbosacral region: Secondary | ICD-10-CM | POA: Insufficient documentation

## 2012-04-07 DIAGNOSIS — G8929 Other chronic pain: Secondary | ICD-10-CM | POA: Insufficient documentation

## 2012-04-07 DIAGNOSIS — Z79899 Other long term (current) drug therapy: Secondary | ICD-10-CM | POA: Insufficient documentation

## 2012-04-07 DIAGNOSIS — M5430 Sciatica, unspecified side: Secondary | ICD-10-CM | POA: Insufficient documentation

## 2012-04-07 DIAGNOSIS — F17200 Nicotine dependence, unspecified, uncomplicated: Secondary | ICD-10-CM | POA: Insufficient documentation

## 2012-04-07 DIAGNOSIS — M545 Low back pain, unspecified: Secondary | ICD-10-CM | POA: Insufficient documentation

## 2012-04-07 DIAGNOSIS — F319 Bipolar disorder, unspecified: Secondary | ICD-10-CM | POA: Insufficient documentation

## 2012-04-07 DIAGNOSIS — M51379 Other intervertebral disc degeneration, lumbosacral region without mention of lumbar back pain or lower extremity pain: Secondary | ICD-10-CM | POA: Insufficient documentation

## 2012-04-07 HISTORY — DX: Other intervertebral disc degeneration, lumbar region: M51.36

## 2012-04-07 HISTORY — DX: Other intervertebral disc degeneration, lumbar region without mention of lumbar back pain or lower extremity pain: M51.369

## 2012-04-07 MED ORDER — HYDROMORPHONE HCL PF 2 MG/ML IJ SOLN
2.0000 mg | Freq: Once | INTRAMUSCULAR | Status: AC
  Administered 2012-04-07: 2 mg via INTRAMUSCULAR
  Filled 2012-04-07: qty 1

## 2012-04-07 MED ORDER — HYDROCODONE-IBUPROFEN 7.5-200 MG PO TABS: 1.0000 | ORAL_TABLET | Freq: Four times a day (QID) | ORAL | Status: DC | PRN

## 2012-04-07 NOTE — ED Notes (Signed)
Pt reports has been out of his pain medication x 3 days.  C/O chronic back pain.  Pt tachycardic, placed on monitor.

## 2012-04-07 NOTE — ED Notes (Signed)
Pt states unable to see Dr. Roxy Horseman until Tuesday. Pt states ran out of percocet two days ago.

## 2012-04-07 NOTE — ED Notes (Signed)
Pt states wants to know what his MRI said from a month or so ago. Pt complaining of chronic back pain.

## 2012-04-07 NOTE — ED Provider Notes (Signed)
History     CSN: [id number]  Arrival date & time 04/07/12  0705   First MD Initiated Contact with Patient 04/07/12 [phone]      Chief Complaint  Patient presents with  . Back Pain     HPI Pt was seen at 0755.  Per pt, c/o gradual onset and persistence of constant acute flair of his chronic low back "pain" for the past 3 days.  Pain began when he ran out of his usual chronic narcotic pain meds 2-3 days ago.  Denies any change in his usual chronic pain pattern.  Pain worsens with palpation of the area and body position changes.  States he has an appt with his PMD next week.  Denies incont/retention of bowel or bladder, no saddle anesthesia, no focal motor weakness, no tingling/numbness in extremities, no fevers, no injury, no abd pain.   The symptoms have been associated with no other complaints. The patient has a significant history of similar symptoms previously, recently being evaluated for this complaint and multiple prior evals for same.     Past Medical History  Diagnosis Date  . Depression   . Bipolar 1 disorder   . ADHD (attention deficit hyperactivity disorder)   . Hepatitis C   . Chronic pain     chronic l leg pain  . Sciatica   . Degenerative disc disease, lumbar   . GERD (gastroesophageal reflux disease)   . History of stomach ulcers   . DDD (degenerative disc disease), lumbar     Past Surgical History  Procedure Laterality Date  . Fracture surgery    . Tibia im nail insertion  01/27/2012    Procedure: INTRAMEDULLARY (IM) NAIL TIBIAL;  Surgeon: Kathryne Hitch, MD;  Location: [facility];  Service: Orthopedics;  Laterality: Left;  Removal of IM Rod and Screws Left Tibia with Exchange Nail, Allograft Bone Graft Left Tibia  . Hardware removal  01/27/2012    Procedure: HARDWARE REMOVAL;  Surgeon: Kathryne Hitch, MD;  Location: [facility];  Service: Orthopedics;  Laterality: Left;    Family History  Problem Relation Age of Onset  . Diabetes Mother     History   Substance Use Topics  . Smoking status: Current Every Day Smoker -- 1.00 packs/day for 24 years    Types: Cigarettes  . Smokeless tobacco: Former Neurosurgeon    Quit date: 08/08/2006  . Alcohol Use: Yes     Comment: occasionally liquor    Review of Systems ROS: Statement: All systems negative except as marked or noted in the HPI; Constitutional: Negative for fever and chills. ; ; Eyes: Negative for eye pain, redness and discharge. ; ; ENMT: Negative for ear pain, hoarseness, nasal congestion, sinus pressure and sore throat. ; ; Cardiovascular: Negative for chest pain, palpitations, diaphoresis, dyspnea and peripheral edema. ; ; Respiratory: Negative for cough, wheezing and stridor. ; ; Gastrointestinal: Negative for nausea, vomiting, diarrhea, abdominal pain, blood in stool, hematemesis, jaundice and rectal bleeding. . ; ; Genitourinary: Negative for dysuria, flank pain and hematuria. ; ; Musculoskeletal: +LBP. Negative for neck pain. Negative for swelling and trauma.; ; Skin: Negative for pruritus, rash, abrasions, blisters, bruising and skin lesion.; ; Neuro: Negative for headache, lightheadedness and neck stiffness. Negative for weakness, altered level of consciousness , altered mental status, extremity weakness, paresthesias, involuntary movement, seizure and syncope.       Allergies  Acetaminophen and Morphine and related  Home Medications   Current Outpatient Rx  Name  Route  Sig  Dispense  Refill  . Cyanocobalamin (VITAMIN B-12 PO)   Oral   Take 1 capsule by mouth daily.          . DULoxetine (CYMBALTA) 30 MG capsule   Oral   Take 30 mg by mouth at bedtime.         Marland Kitchen HYDROcodone-ibuprofen (VICOPROFEN) 7.5-200 MG per tablet   Oral   Take 1 tablet by mouth every 6 (six) hours as needed for pain.   15 tablet   0   . mirtazapine (REMERON) 30 MG tablet   Oral   Take 30 mg by mouth at bedtime.         Marland Kitchen oxycodone (OXY-IR) 5 MG capsule   Oral   Take 1 capsule (5 mg total)  by mouth every 4 (four) hours as needed for pain. For pain   90 capsule   0   . QUEtiapine Fumarate (SEROQUEL XR) 150 MG 24 hr tablet   Oral   Take 150 mg by mouth at bedtime.           BP 149/88  Pulse 140  Temp(Src) 98 F (36.7 C) (Oral)  Ht 5\' 3"  (1.6 m)  Wt 165 lb (74.844 kg)  BMI 29.24 kg/m2  SpO2 98%  Physical Exam 0800: Physical examination:  Nursing notes reviewed; Vital signs and O2 SAT reviewed;  Constitutional: Well developed, Well nourished, Well hydrated, In no acute distress; Head:  Normocephalic, atraumatic; Eyes: EOMI, PERRL, No scleral icterus; ENMT: Mouth and pharynx normal, Mucous membranes moist; Neck: Supple, Full range of motion, No lymphadenopathy; Cardiovascular: Tachycardic rate and rhythm, No gallop; Respiratory: Breath sounds clear & equal bilaterally, No rales, rhonchi, wheezes.  Speaking full sentences with ease, Normal respiratory effort/excursion; Chest: Nontender, Movement normal; Abdomen: Soft, Nontender, Nondistended, Normal bowel sounds; Genitourinary: No CVA tenderness; Spine:  No midline CS, TS, LS tenderness.  +TTP left lumbar paraspinal muscles;;  Extremities: Pulses normal, No tenderness, No edema, No calf edema or asymmetry.; Neuro: AA&Ox3, Major CN grossly intact.  Speech clear. Strength 5/5 equal bilat UE's and LE's, including great toe dorsiflexion.  DTR 2/4 equal bilat UE's and LE's.  No gross sensory deficits.  Neg straight leg raises bilat. Gait steady.;; Skin: Color normal, Warm, Dry.   ED Course  Procedures     MDM  MDM Reviewed: previous chart, nursing note and vitals Reviewed previous: MRI     0810:  Long hx of chronic pain with multiple ED visits for same. Has been out of his longstanding narcotic pain med x3 days. Tachycardic here; likely pain + narcotic withdrawal. Will dose IM narcotic.  Pt endorses acute flair of his usual long standing chronic pain today, no change from his usual chronic pain pattern.  Pt encouraged to f/u  with his PMD and Pain Management doctor for good continuity of care and control of his chronic pain.  Verb understanding.        Laray Anger, DO 04/07/12 Herbie [place]

## 2012-05-31 ENCOUNTER — Encounter (HOSPITAL_COMMUNITY): Payer: Self-pay

## 2012-05-31 ENCOUNTER — Emergency Department (HOSPITAL_COMMUNITY)
Admission: EM | Admit: 2012-05-31 | Discharge: 2012-05-31 | Disposition: A | Discharge status: Home / Self Care | Payer: Self-pay | Attending: Emergency Medicine | Admitting: Emergency Medicine

## 2012-05-31 DIAGNOSIS — Z9889 Other specified postprocedural states: Secondary | ICD-10-CM | POA: Insufficient documentation

## 2012-05-31 DIAGNOSIS — Z8719 Personal history of other diseases of the digestive system: Secondary | ICD-10-CM | POA: Insufficient documentation

## 2012-05-31 DIAGNOSIS — F17200 Nicotine dependence, unspecified, uncomplicated: Secondary | ICD-10-CM | POA: Insufficient documentation

## 2012-05-31 DIAGNOSIS — Z8739 Personal history of other diseases of the musculoskeletal system and connective tissue: Secondary | ICD-10-CM | POA: Insufficient documentation

## 2012-05-31 DIAGNOSIS — G8929 Other chronic pain: Secondary | ICD-10-CM | POA: Insufficient documentation

## 2012-05-31 DIAGNOSIS — F909 Attention-deficit hyperactivity disorder, unspecified type: Secondary | ICD-10-CM | POA: Insufficient documentation

## 2012-05-31 DIAGNOSIS — F329 Major depressive disorder, single episode, unspecified: Secondary | ICD-10-CM | POA: Insufficient documentation

## 2012-05-31 DIAGNOSIS — M5137 Other intervertebral disc degeneration, lumbosacral region: Secondary | ICD-10-CM | POA: Insufficient documentation

## 2012-05-31 DIAGNOSIS — M79609 Pain in unspecified limb: Secondary | ICD-10-CM | POA: Insufficient documentation

## 2012-05-31 DIAGNOSIS — K219 Gastro-esophageal reflux disease without esophagitis: Secondary | ICD-10-CM | POA: Insufficient documentation

## 2012-05-31 DIAGNOSIS — M5136 Other intervertebral disc degeneration, lumbar region: Secondary | ICD-10-CM

## 2012-05-31 DIAGNOSIS — Z79899 Other long term (current) drug therapy: Secondary | ICD-10-CM | POA: Insufficient documentation

## 2012-05-31 DIAGNOSIS — B182 Chronic viral hepatitis C: Secondary | ICD-10-CM | POA: Insufficient documentation

## 2012-05-31 DIAGNOSIS — Z8619 Personal history of other infectious and parasitic diseases: Secondary | ICD-10-CM | POA: Insufficient documentation

## 2012-05-31 DIAGNOSIS — F319 Bipolar disorder, unspecified: Secondary | ICD-10-CM | POA: Insufficient documentation

## 2012-05-31 DIAGNOSIS — F3289 Other specified depressive episodes: Secondary | ICD-10-CM | POA: Insufficient documentation

## 2012-05-31 DIAGNOSIS — M51379 Other intervertebral disc degeneration, lumbosacral region without mention of lumbar back pain or lower extremity pain: Secondary | ICD-10-CM | POA: Insufficient documentation

## 2012-05-31 DIAGNOSIS — Z8659 Personal history of other mental and behavioral disorders: Secondary | ICD-10-CM | POA: Insufficient documentation

## 2012-05-31 MED ORDER — OXYCODONE HCL 5 MG PO TABS: 5.0000 mg | ORAL_TABLET | ORAL | Status: DC | PRN

## 2012-05-31 NOTE — ED Provider Notes (Signed)
Medical screening examination/treatment/procedure(s) were performed by non-physician practitioner and as supervising physician I was immediately available for consultation/collaboration.   Serapio Edelson, MD 05/31/12 1553 

## 2012-05-31 NOTE — ED Notes (Signed)
Pt c/o chronic lower back pain and left leg pain.  Reports has been out of his pain medication x 2 weeks.

## 2012-05-31 NOTE — ED Notes (Signed)
Complain of low back pain. States he wants his left leg checked out while he is here.

## 2012-05-31 NOTE — ED Provider Notes (Signed)
History     CSN: [id number]  Arrival date & time 05/31/12  1452   First MD Initiated Contact with Patient 05/31/12 1503      Chief Complaint  Patient presents with  . Back Pain    (Consider location/radiation/quality/duration/timing/severity/associated sxs/prior treatment) HPI Comments: Joshua Thomas is a 36 y.o. Male with acute on chronic low back pain which has which has been worsened for the past week since he has run out of his oxycodone.  He has severe lumbar disk disease and is trying to get in with a neurosurgeon referred by his pcp but is limited financially.   Patient denies any new injury specifically.  There is radiation into his left posterior leg to his posterior knee. There has been no weakness or numbness in the lower extremities and no urinary or bowel retention or incontinence.  Patient does not have a history of cancer or IVDU.      The history is provided by the patient.    Past Medical History  Diagnosis Date  . Depression   . Bipolar 1 disorder   . ADHD (attention deficit hyperactivity disorder)   . Hepatitis C   . Chronic pain     chronic l leg pain  . Sciatica   . Degenerative disc disease, lumbar   . GERD (gastroesophageal reflux disease)   . History of stomach ulcers   . DDD (degenerative disc disease), lumbar   . Hepatitis C     Past Surgical History  Procedure Laterality Date  . Fracture surgery    . Tibia im nail insertion  01/27/2012    Procedure: INTRAMEDULLARY (IM) NAIL TIBIAL;  Surgeon: Kathryne Hitch, MD;  Location: [facility];  Service: Orthopedics;  Laterality: Left;  Removal of IM Rod and Screws Left Tibia with Exchange Nail, Allograft Bone Graft Left Tibia  . Hardware removal  01/27/2012    Procedure: HARDWARE REMOVAL;  Surgeon: Kathryne Hitch, MD;  Location: [facility];  Service: Orthopedics;  Laterality: Left;    Family History  Problem Relation Age of Onset  . Diabetes Mother     History  Substance Use Topics  .  Smoking status: Current Every Day Smoker -- 1.00 packs/day for 24 years    Types: Cigarettes  . Smokeless tobacco: Former Neurosurgeon    Quit date: 08/08/2006  . Alcohol Use: Yes     Comment: occasionally liquor      Review of Systems  Constitutional: Negative for fever.  Respiratory: Negative for shortness of breath.   Cardiovascular: Negative for chest pain and leg swelling.  Gastrointestinal: Negative for abdominal pain, constipation and abdominal distention.  Genitourinary: Negative for dysuria, urgency, frequency, flank pain and difficulty urinating.  Musculoskeletal: Positive for back pain and arthralgias. Negative for joint swelling and gait problem.       Chronic left lower tibial pain since his last surgical repair 1/14.  Skin: Negative for rash.  Neurological: Negative for weakness and numbness.    Allergies  Acetaminophen and Morphine and related  Home Medications   Current Outpatient Rx  Name  Route  Sig  Dispense  Refill  . Cyanocobalamin (VITAMIN B-12 PO)   Oral   Take 1 capsule by mouth daily.          . DULoxetine (CYMBALTA) 30 MG capsule   Oral   Take 30 mg by mouth at bedtime.         Marland Kitchen HYDROcodone-ibuprofen (VICOPROFEN) 7.5-200 MG per tablet   Oral  Take 1 tablet by mouth every 6 (six) hours as needed for pain.   15 tablet   0   . mirtazapine (REMERON) 30 MG tablet   Oral   Take 30 mg by mouth at bedtime.         Marland Kitchen oxycodone (OXY-IR) 5 MG capsule   Oral   Take 1 capsule (5 mg total) by mouth every 4 (four) hours as needed for pain. For pain   90 capsule   0   . oxyCODONE (ROXICODONE) 5 MG immediate release tablet   Oral   Take 1 tablet (5 mg total) by mouth every 4 (four) hours as needed for pain.   20 tablet   0   . QUEtiapine Fumarate (SEROQUEL XR) 150 MG 24 hr tablet   Oral   Take 150 mg by mouth at bedtime.           BP 154/90  Pulse 105  Temp(Src) 98 F (36.7 C) (Oral)  Resp 18  Ht 5\' 3"  (1.6 m)  Wt 165 lb (74.844 kg)   BMI 29.24 kg/m2  SpO2 100%  Physical Exam  Nursing note and vitals reviewed. Constitutional: He appears well-developed and well-nourished.  HENT:  Head: Normocephalic.  Eyes: Conjunctivae are normal.  Neck: Normal range of motion. Neck supple.  Cardiovascular: Normal rate and intact distal pulses.   Pedal pulses normal.  Pulmonary/Chest: Effort normal.  Abdominal: Soft. Bowel sounds are normal. He exhibits no distension and no mass.  Musculoskeletal: Normal range of motion. He exhibits no edema.       Lumbar back: He exhibits no tenderness, no swelling, no edema and no spasm.  Palpation of lumbar spine does not make pain worse.  SI joint ttp. Left.  Neurological: He is alert. He has normal strength. He displays no atrophy and no tremor. No sensory deficit. Gait normal.  Reflex Scores:      Patellar reflexes are 2+ on the right side and 2+ on the left side.      Achilles reflexes are 2+ on the right side and 2+ on the left side. No strength deficit noted in hip and knee flexor and extensor muscle groups.  Ankle flexion and extension intact.  Skin: Skin is warm and dry.  Psychiatric: He has a normal mood and affect.    ED Course  Procedures (including critical care time)  Labs Reviewed - No data to display No results found.   1. DDD (degenerative disc disease), lumbar       MDM  No neuro deficit on exam or by history to suggest emergent or surgical presentation.  Also discussed worsened sx that should prompt immediate re-evaluation including distal weakness, bowel/bladder retention/incontinence.  Pt prescribed oxycodone.  Encouraged f/u with pcp and the neurosurgeon he was referred to.    MRI reviewed, pt has degenerative changes at every level of lumbar spine.              Burgess Amor, PA-C 05/31/12 1530

## 2012-08-01 ENCOUNTER — Emergency Department (HOSPITAL_COMMUNITY)
Admission: EM | Admit: 2012-08-01 | Discharge: 2012-08-01 | Disposition: A | Discharge status: Home / Self Care | Payer: Self-pay | Attending: Emergency Medicine | Admitting: Emergency Medicine

## 2012-08-01 ENCOUNTER — Encounter (HOSPITAL_COMMUNITY): Payer: Self-pay | Admitting: *Deleted

## 2012-08-01 DIAGNOSIS — F319 Bipolar disorder, unspecified: Secondary | ICD-10-CM | POA: Insufficient documentation

## 2012-08-01 DIAGNOSIS — Z79899 Other long term (current) drug therapy: Secondary | ICD-10-CM | POA: Insufficient documentation

## 2012-08-01 DIAGNOSIS — Y929 Unspecified place or not applicable: Secondary | ICD-10-CM | POA: Insufficient documentation

## 2012-08-01 DIAGNOSIS — Y9389 Activity, other specified: Secondary | ICD-10-CM | POA: Insufficient documentation

## 2012-08-01 DIAGNOSIS — S335XXA Sprain of ligaments of lumbar spine, initial encounter: Secondary | ICD-10-CM | POA: Insufficient documentation

## 2012-08-01 DIAGNOSIS — X503XXA Overexertion from repetitive movements, initial encounter: Secondary | ICD-10-CM | POA: Insufficient documentation

## 2012-08-01 DIAGNOSIS — M79609 Pain in unspecified limb: Secondary | ICD-10-CM | POA: Insufficient documentation

## 2012-08-01 DIAGNOSIS — Z8739 Personal history of other diseases of the musculoskeletal system and connective tissue: Secondary | ICD-10-CM | POA: Insufficient documentation

## 2012-08-01 DIAGNOSIS — S39012A Strain of muscle, fascia and tendon of lower back, initial encounter: Secondary | ICD-10-CM

## 2012-08-01 DIAGNOSIS — Z8719 Personal history of other diseases of the digestive system: Secondary | ICD-10-CM | POA: Insufficient documentation

## 2012-08-01 DIAGNOSIS — G8929 Other chronic pain: Secondary | ICD-10-CM | POA: Insufficient documentation

## 2012-08-01 DIAGNOSIS — F17200 Nicotine dependence, unspecified, uncomplicated: Secondary | ICD-10-CM | POA: Insufficient documentation

## 2012-08-01 DIAGNOSIS — F909 Attention-deficit hyperactivity disorder, unspecified type: Secondary | ICD-10-CM | POA: Insufficient documentation

## 2012-08-01 MED ORDER — DIAZEPAM 5 MG PO TABS
5.0000 mg | ORAL_TABLET | Freq: Once | ORAL | Status: AC
  Administered 2012-08-01: 5 mg via ORAL
  Filled 2012-08-01: qty 1

## 2012-08-01 MED ORDER — KETOROLAC TROMETHAMINE 60 MG/2ML IM SOLN
60.0000 mg | Freq: Once | INTRAMUSCULAR | Status: AC
  Administered 2012-08-01: 60 mg via INTRAMUSCULAR
  Filled 2012-08-01: qty 2

## 2012-08-01 MED ORDER — CYCLOBENZAPRINE HCL 10 MG PO TABS: 10.0000 mg | ORAL_TABLET | Freq: Two times a day (BID) | ORAL | Status: DC | PRN

## 2012-08-01 MED ORDER — HYDROMORPHONE HCL PF 2 MG/ML IJ SOLN
2.0000 mg | Freq: Once | INTRAMUSCULAR | Status: AC
  Administered 2012-08-01: 2 mg via INTRAMUSCULAR
  Filled 2012-08-01: qty 1

## 2012-08-01 MED ORDER — HYDROCODONE-IBUPROFEN 7.5-200 MG PO TABS: 1.0000 | ORAL_TABLET | Freq: Four times a day (QID) | ORAL | Status: DC | PRN

## 2012-08-01 NOTE — ED Notes (Addendum)
Pt is here with back pain all over after lifting 5 gallon bucket of paint yesterday.

## 2012-08-01 NOTE — ED Provider Notes (Signed)
History    CSN: [id number] Arrival date & time 08/01/12  1216  First MD Initiated Contact with Patient 08/01/12 1224     Chief Complaint  Patient presents with  . Back Pain   (Consider location/radiation/quality/duration/timing/severity/associated sxs/prior Treatment) HPI Joshua Thomas is a 37 y.o. male who presents to ED with complaint of back pain. States lifted 5 gallon paint can yesterday when felt sharp sudden pain in his back that time. States has been laying down since, unable to move or ambulate due to pain. Denies pain radiation. No numbness or weakness in extremities. No abdominal pain.  Denies fever, chills.  Past Medical History  Diagnosis Date  . Depression   . Bipolar 1 disorder   . ADHD (attention deficit hyperactivity disorder)   . Hepatitis C   . Chronic pain     chronic l leg pain  . Sciatica   . Degenerative disc disease, lumbar   . GERD (gastroesophageal reflux disease)   . History of stomach ulcers   . DDD (degenerative disc disease), lumbar   . Hepatitis C    Past Surgical History  Procedure Laterality Date  . Fracture surgery    . Tibia im nail insertion  01/27/2012    Procedure: INTRAMEDULLARY (IM) NAIL TIBIAL;  Surgeon: Kathryne Hitch, MD;  Location: [facility];  Service: Orthopedics;  Laterality: Left;  Removal of IM Rod and Screws Left Tibia with Exchange Nail, Allograft Bone Graft Left Tibia  . Hardware removal  01/27/2012    Procedure: HARDWARE REMOVAL;  Surgeon: Kathryne Hitch, MD;  Location: [facility];  Service: Orthopedics;  Laterality: Left;   Family History  Problem Relation Age of Onset  . Diabetes Mother    History  Substance Use Topics  . Smoking status: Current Every Day Smoker -- 1.00 packs/day for 24 years    Types: Cigarettes  . Smokeless tobacco: Former Neurosurgeon    Quit date: 08/08/2006  . Alcohol Use: Yes     Comment: occasionally liquor    Review of Systems  Constitutional: Negative for fever and chills.  HENT:  Negative for neck pain and neck stiffness.   Gastrointestinal: Negative for abdominal pain.  Genitourinary: Negative for flank pain.  Musculoskeletal: Positive for myalgias, back pain and arthralgias.  Neurological: Negative for weakness and numbness.    Allergies  Acetaminophen and Morphine and related  Home Medications   Current Outpatient Rx  Name  Route  Sig  Dispense  Refill  . Cyanocobalamin (VITAMIN B-12 PO)   Oral   Take 1 capsule by mouth daily.          . DULoxetine (CYMBALTA) 30 MG capsule   Oral   Take 30 mg by mouth at bedtime.         Marland Kitchen HYDROcodone-ibuprofen (VICOPROFEN) 7.5-200 MG per tablet   Oral   Take 1 tablet by mouth every 6 (six) hours as needed for pain.   15 tablet   0   . mirtazapine (REMERON) 30 MG tablet   Oral   Take 30 mg by mouth at bedtime.         Marland Kitchen oxycodone (OXY-IR) 5 MG capsule   Oral   Take 1 capsule (5 mg total) by mouth every 4 (four) hours as needed for pain. For pain   90 capsule   0   . oxyCODONE (ROXICODONE) 5 MG immediate release tablet   Oral   Take 1 tablet (5 mg total) by mouth every 4 (four) hours  as needed for pain.   20 tablet   0   . QUEtiapine Fumarate (SEROQUEL XR) 150 MG 24 hr tablet   Oral   Take 150 mg by mouth at bedtime.          BP 106/90  Pulse 113  Temp(Src) 98.3 F (36.8 C) (Oral)  Resp 20  SpO2 94% Physical Exam  Nursing note and vitals reviewed. Constitutional: He is oriented to person, place, and time. He appears well-developed and well-nourished. No distress.  HENT:  Head: Normocephalic.  Eyes: Conjunctivae are normal.  Cardiovascular: Normal rate, regular rhythm and normal heart sounds.   Pulmonary/Chest: Effort normal and breath sounds normal. No respiratory distress. He has no wheezes. He has no rales.  Abdominal: Soft. Bowel sounds are normal. He exhibits no distension. There is no tenderness. There is no rebound.  Musculoskeletal: He exhibits no edema.  No midline or  perivertebral lumbar spine tenderness. No pain with bilateral straight leg raise. DP pulses normal.   Neurological: He is alert and oriented to person, place, and time. No cranial nerve deficit.  5/5 and equal lower extremity strength. 2+ and equal patellar reflexes bilaterally. Pt able to dorsiflex bilateral toes and feet with good strength against resistance. Equal sensation bilaterally over thighs and lower legs.   Skin: Skin is warm and dry.    ED Course  Procedures (including critical care time) Labs Reviewed - No data to display No results found.  1. Lumbar strain, initial encounter     MDM  Pt with pain to the left lumbar musculature. No tenderness to palpation. Pain reproduced with movement. Neurovascularly intact. Given dilaudid for pain IM, toradol, valium. Home with norco, ibuprofen, flexeril, follow up with PCP.   Filed Vitals:   08/01/12 1224  BP: 106/90  Pulse: 113  Temp: 98.3 F (36.8 C)  TempSrc: Oral  Resp: 20  SpO2: 94%     Toddy Boyd A Jaskarn Schweer, PA-C 08/01/12 1528

## 2012-08-03 NOTE — ED Provider Notes (Signed)
Medical screening examination/treatment/procedure(s) were performed by non-physician practitioner and as supervising physician I was immediately available for consultation/collaboration.   Kesi Perrow, MD 08/03/12 0732 

## 2012-08-29 ENCOUNTER — Encounter (HOSPITAL_COMMUNITY): Payer: Self-pay | Admitting: *Deleted

## 2012-08-29 ENCOUNTER — Emergency Department (HOSPITAL_COMMUNITY)
Admission: EM | Admit: 2012-08-29 | Discharge: 2012-08-29 | Disposition: A | Discharge status: Home / Self Care | Payer: Self-pay | Attending: Emergency Medicine | Admitting: Emergency Medicine

## 2012-08-29 DIAGNOSIS — Z79899 Other long term (current) drug therapy: Secondary | ICD-10-CM | POA: Insufficient documentation

## 2012-08-29 DIAGNOSIS — F319 Bipolar disorder, unspecified: Secondary | ICD-10-CM | POA: Insufficient documentation

## 2012-08-29 DIAGNOSIS — G8929 Other chronic pain: Secondary | ICD-10-CM | POA: Insufficient documentation

## 2012-08-29 DIAGNOSIS — F909 Attention-deficit hyperactivity disorder, unspecified type: Secondary | ICD-10-CM | POA: Insufficient documentation

## 2012-08-29 DIAGNOSIS — IMO0002 Reserved for concepts with insufficient information to code with codable children: Secondary | ICD-10-CM | POA: Insufficient documentation

## 2012-08-29 DIAGNOSIS — Z8739 Personal history of other diseases of the musculoskeletal system and connective tissue: Secondary | ICD-10-CM | POA: Insufficient documentation

## 2012-08-29 DIAGNOSIS — Z8619 Personal history of other infectious and parasitic diseases: Secondary | ICD-10-CM | POA: Insufficient documentation

## 2012-08-29 DIAGNOSIS — F17200 Nicotine dependence, unspecified, uncomplicated: Secondary | ICD-10-CM | POA: Insufficient documentation

## 2012-08-29 DIAGNOSIS — Z8719 Personal history of other diseases of the digestive system: Secondary | ICD-10-CM | POA: Insufficient documentation

## 2012-08-29 DIAGNOSIS — R52 Pain, unspecified: Secondary | ICD-10-CM | POA: Insufficient documentation

## 2012-08-29 DIAGNOSIS — M5416 Radiculopathy, lumbar region: Secondary | ICD-10-CM

## 2012-08-29 MED ORDER — CYCLOBENZAPRINE HCL 5 MG PO TABS: 5.0000 mg | ORAL_TABLET | Freq: Three times a day (TID) | ORAL | Status: DC | PRN

## 2012-08-29 MED ORDER — HYDROMORPHONE HCL PF 1 MG/ML IJ SOLN
1.0000 mg | Freq: Once | INTRAMUSCULAR | Status: AC
  Administered 2012-08-29: 1 mg via INTRAMUSCULAR
  Filled 2012-08-29: qty 1

## 2012-08-29 MED ORDER — HYDROCODONE-IBUPROFEN 7.5-200 MG PO TABS: 1.0000 | ORAL_TABLET | Freq: Four times a day (QID) | ORAL | Status: DC | PRN

## 2012-08-29 NOTE — ED Notes (Signed)
Low back pain . 

## 2012-08-29 NOTE — ED Provider Notes (Signed)
CSN: [id number]     Arrival date & time 08/29/12  1437 History     First MD Initiated Contact with Patient 08/29/12 1627     Chief Complaint  Patient presents with  . Back Pain   (Consider location/radiation/quality/duration/timing/severity/associated sxs/prior Treatment) HPI Comments: Joshua Thomas is a 37 y.o. Male presenting  with acute on chronic low back pain which has which has been worsened for the past 2 days.   Patient denies any new injury specifically.  There is radiation into his left posterior upper thigh which he describes as aching and burning.  There has been no weakness or numbness in the lower extremities and no urinary or bowel retention or incontinence.  Patient does not have a history of cancer or IVDU. He has known degenerative disk disease and has been referred to a neurosurgeon but has been unable to get seen secondary to copay issues but also asserts that he is leery of surgery.  He does take vicoprofen which is generally prescribed by his pcp.  He does state muscle relaxers are helpful.      The history is provided by the patient.    Past Medical History  Diagnosis Date  . Depression   . Bipolar 1 disorder   . ADHD (attention deficit hyperactivity disorder)   . Hepatitis C   . Chronic pain     chronic l leg pain  . Sciatica   . Degenerative disc disease, lumbar   . GERD (gastroesophageal reflux disease)   . History of stomach ulcers   . DDD (degenerative disc disease), lumbar   . Hepatitis C    Past Surgical History  Procedure Laterality Date  . Fracture surgery    . Tibia im nail insertion  01/27/2012    Procedure: INTRAMEDULLARY (IM) NAIL TIBIAL;  Surgeon: Kathryne Hitch, MD;  Location: [facility];  Service: Orthopedics;  Laterality: Left;  Removal of IM Rod and Screws Left Tibia with Exchange Nail, Allograft Bone Graft Left Tibia  . Hardware removal  01/27/2012    Procedure: HARDWARE REMOVAL;  Surgeon: Kathryne Hitch, MD;  Location: [facility];  Service: Orthopedics;  Laterality: Left;   Family History  Problem Relation Age of Onset  . Diabetes Mother    History  Substance Use Topics  . Smoking status: Current Every Day Smoker -- 1.00 packs/day for 24 years    Types: Cigarettes  . Smokeless tobacco: Former Neurosurgeon    Quit date: 08/08/2006  . Alcohol Use: Yes     Comment: occasionally liquor    Review of Systems  Constitutional: Negative for fever.  Respiratory: Negative for shortness of breath.   Cardiovascular: Negative for chest pain and leg swelling.  Gastrointestinal: Negative for abdominal pain, constipation and abdominal distention.  Genitourinary: Negative for dysuria, urgency, frequency, flank pain and difficulty urinating.  Musculoskeletal: Positive for back pain. Negative for joint swelling and gait problem.  Skin: Negative for rash.  Neurological: Negative for weakness and numbness.    Allergies  Acetaminophen and Morphine and related  Home Medications   Current Outpatient Rx  Name  Route  Sig  Dispense  Refill  . Aspirin-Acetaminophen-Caffeine (GOODYS EXTRA STRENGTH PO)   Oral   Take 1 packet by mouth every 6 (six) hours as needed.         . Cyanocobalamin (VITAMIN B-12 PO)   Oral   Take 1 capsule by mouth daily.          . cyclobenzaprine (FLEXERIL) 10  MG tablet   Oral   Take 1 tablet (10 mg total) by mouth 2 (two) times daily as needed for muscle spasms.   20 tablet   0   . cyclobenzaprine (FLEXERIL) 5 MG tablet   Oral   Take 1 tablet (5 mg total) by mouth 3 (three) times daily as needed for muscle spasms.   15 tablet   0   . DULoxetine (CYMBALTA) 30 MG capsule   Oral   Take 30 mg by mouth at bedtime.         Marland Kitchen HYDROcodone-ibuprofen (VICOPROFEN) 7.5-200 MG per tablet   Oral   Take 1 tablet by mouth every 6 (six) hours as needed for pain.   15 tablet   0   . HYDROcodone-ibuprofen (VICOPROFEN) 7.5-200 MG per tablet   Oral   Take 1 tablet by mouth every 6 (six) hours as  needed for pain.   20 tablet   0   . QUEtiapine Fumarate (SEROQUEL XR) 150 MG 24 hr tablet   Oral   Take 150 mg by mouth at bedtime.          BP 159/99  Pulse 106  Temp(Src) 98.5 F (36.9 C) (Oral)  Resp 22  Ht 5\' 3"  (1.6 m)  Wt 160 lb (72.576 kg)  BMI 28.35 kg/m2  SpO2 100% Physical Exam  Nursing note and vitals reviewed. Constitutional: He appears well-developed and well-nourished.  HENT:  Head: Normocephalic.  Eyes: Conjunctivae are normal.  Neck: Normal range of motion. Neck supple.  Cardiovascular: Normal rate and intact distal pulses.   Pedal pulses normal.  Pulmonary/Chest: Effort normal.  Abdominal: Soft. Bowel sounds are normal. He exhibits no distension and no mass.  Musculoskeletal: Normal range of motion. He exhibits tenderness. He exhibits no edema.       Lumbar back: He exhibits tenderness and spasm. He exhibits no swelling and no edema.  Neurological: He is alert. He has normal strength. He displays no atrophy and no tremor. No sensory deficit. Gait normal.  Reflex Scores:      Patellar reflexes are 2+ on the right side and 2+ on the left side.      Achilles reflexes are 2+ on the right side and 2+ on the left side. No strength deficit noted in hip and knee flexor and extensor muscle groups.  Ankle flexion and extension intact.  Skin: Skin is warm and dry.  Psychiatric: He has a normal mood and affect.    ED Course   Procedures (including critical care time)  Labs Reviewed - No data to display No results found. 1. Left lumbar radiculopathy     MDM  No neuro deficit on exam or by history to suggest emergent or surgical presentation.  Also discussed worsened sx that should prompt immediate re-evaluation including distal weakness, bowel/bladder retention/incontinence.      Pt prescribed small amount of vicoprofen,  Flexeril.  Encouraged f/u with pcp.  Heat therapy, rest.  Burgess Amor, PA-C 08/29/12 1720

## 2012-08-30 NOTE — ED Provider Notes (Signed)
Medical screening examination/treatment/procedure(s) were performed by non-physician practitioner and as supervising physician I was immediately available for consultation/collaboration.   Jevon Littlepage S Real Cona, MD 08/30/12 1148 

## 2012-09-29 ENCOUNTER — Emergency Department (HOSPITAL_COMMUNITY): Payer: Self-pay

## 2012-09-29 ENCOUNTER — Encounter (HOSPITAL_COMMUNITY): Payer: Self-pay | Admitting: Emergency Medicine

## 2012-09-29 ENCOUNTER — Emergency Department (HOSPITAL_COMMUNITY)
Admission: EM | Admit: 2012-09-29 | Discharge: 2012-09-29 | Disposition: A | Discharge status: Home / Self Care | Payer: Self-pay | Attending: Emergency Medicine | Admitting: Emergency Medicine

## 2012-09-29 DIAGNOSIS — Z8739 Personal history of other diseases of the musculoskeletal system and connective tissue: Secondary | ICD-10-CM | POA: Insufficient documentation

## 2012-09-29 DIAGNOSIS — Z8711 Personal history of peptic ulcer disease: Secondary | ICD-10-CM | POA: Insufficient documentation

## 2012-09-29 DIAGNOSIS — Z79899 Other long term (current) drug therapy: Secondary | ICD-10-CM | POA: Insufficient documentation

## 2012-09-29 DIAGNOSIS — Z8719 Personal history of other diseases of the digestive system: Secondary | ICD-10-CM | POA: Insufficient documentation

## 2012-09-29 DIAGNOSIS — Z8619 Personal history of other infectious and parasitic diseases: Secondary | ICD-10-CM | POA: Insufficient documentation

## 2012-09-29 DIAGNOSIS — S20219A Contusion of unspecified front wall of thorax, initial encounter: Secondary | ICD-10-CM | POA: Insufficient documentation

## 2012-09-29 DIAGNOSIS — G8929 Other chronic pain: Secondary | ICD-10-CM | POA: Insufficient documentation

## 2012-09-29 DIAGNOSIS — S20212A Contusion of left front wall of thorax, initial encounter: Secondary | ICD-10-CM

## 2012-09-29 DIAGNOSIS — Y939 Activity, unspecified: Secondary | ICD-10-CM | POA: Insufficient documentation

## 2012-09-29 DIAGNOSIS — F319 Bipolar disorder, unspecified: Secondary | ICD-10-CM | POA: Insufficient documentation

## 2012-09-29 DIAGNOSIS — F17200 Nicotine dependence, unspecified, uncomplicated: Secondary | ICD-10-CM | POA: Insufficient documentation

## 2012-09-29 DIAGNOSIS — Y92410 Unspecified street and highway as the place of occurrence of the external cause: Secondary | ICD-10-CM | POA: Insufficient documentation

## 2012-09-29 LAB — URINALYSIS, ROUTINE W REFLEX MICROSCOPIC
Bilirubin Urine: NEGATIVE
Glucose, UA: NEGATIVE mg/dL
Specific Gravity, Urine: 1.02 (ref 1.005–1.030)

## 2012-09-29 LAB — URINE MICROSCOPIC-ADD ON

## 2012-09-29 MED ORDER — OXYCODONE HCL 5 MG PO TABS
5.0000 mg | ORAL_TABLET | Freq: Once | ORAL | Status: AC
  Administered 2012-09-29: 5 mg via ORAL
  Filled 2012-09-29: qty 1

## 2012-09-29 MED ORDER — CYCLOBENZAPRINE HCL 10 MG PO TABS
10.0000 mg | ORAL_TABLET | Freq: Once | ORAL | Status: AC
  Administered 2012-09-29: 10 mg via ORAL
  Filled 2012-09-29: qty 1

## 2012-09-29 MED ORDER — HYDROMORPHONE HCL 2 MG PO TABS
2.0000 mg | ORAL_TABLET | Freq: Once | ORAL | Status: AC
  Administered 2012-09-29: 2 mg via ORAL
  Filled 2012-09-29: qty 1

## 2012-09-29 MED ORDER — CYCLOBENZAPRINE HCL 10 MG PO TABS: 10.0000 mg | ORAL_TABLET | Freq: Two times a day (BID) | ORAL | Status: DC | PRN

## 2012-09-29 MED ORDER — HYDROCODONE-IBUPROFEN 7.5-200 MG PO TABS: 1.0000 | ORAL_TABLET | Freq: Four times a day (QID) | ORAL | Status: DC | PRN
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
## 2012-09-29 NOTE — ED Notes (Signed)
MD at bedside. 
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
## 2012-09-30 LAB — URINE CULTURE

## 2012-10-20 ENCOUNTER — Encounter (HOSPITAL_COMMUNITY): Payer: Self-pay | Admitting: Emergency Medicine

## 2012-10-20 ENCOUNTER — Emergency Department (HOSPITAL_COMMUNITY)
Admission: EM | Admit: 2012-10-20 | Discharge: 2012-10-20 | Disposition: A | Discharge status: Home / Self Care | Payer: Self-pay | Attending: Emergency Medicine | Admitting: Emergency Medicine

## 2012-10-20 ENCOUNTER — Emergency Department (HOSPITAL_COMMUNITY): Payer: Self-pay

## 2012-10-20 DIAGNOSIS — F17200 Nicotine dependence, unspecified, uncomplicated: Secondary | ICD-10-CM | POA: Insufficient documentation

## 2012-10-20 DIAGNOSIS — Y93G1 Activity, food preparation and clean up: Secondary | ICD-10-CM | POA: Insufficient documentation

## 2012-10-20 DIAGNOSIS — R0789 Other chest pain: Secondary | ICD-10-CM

## 2012-10-20 DIAGNOSIS — Z79899 Other long term (current) drug therapy: Secondary | ICD-10-CM | POA: Insufficient documentation

## 2012-10-20 DIAGNOSIS — G8929 Other chronic pain: Secondary | ICD-10-CM | POA: Insufficient documentation

## 2012-10-20 DIAGNOSIS — Z8619 Personal history of other infectious and parasitic diseases: Secondary | ICD-10-CM | POA: Insufficient documentation

## 2012-10-20 DIAGNOSIS — Z8711 Personal history of peptic ulcer disease: Secondary | ICD-10-CM | POA: Insufficient documentation

## 2012-10-20 DIAGNOSIS — Y9289 Other specified places as the place of occurrence of the external cause: Secondary | ICD-10-CM | POA: Insufficient documentation

## 2012-10-20 DIAGNOSIS — Z8719 Personal history of other diseases of the digestive system: Secondary | ICD-10-CM | POA: Insufficient documentation

## 2012-10-20 DIAGNOSIS — Z8739 Personal history of other diseases of the musculoskeletal system and connective tissue: Secondary | ICD-10-CM | POA: Insufficient documentation

## 2012-10-20 DIAGNOSIS — W010XXA Fall on same level from slipping, tripping and stumbling without subsequent striking against object, initial encounter: Secondary | ICD-10-CM | POA: Insufficient documentation

## 2012-10-20 DIAGNOSIS — F319 Bipolar disorder, unspecified: Secondary | ICD-10-CM | POA: Insufficient documentation

## 2012-10-20 DIAGNOSIS — S298XXA Other specified injuries of thorax, initial encounter: Secondary | ICD-10-CM | POA: Insufficient documentation

## 2012-10-20 MED ORDER — KETOROLAC TROMETHAMINE 60 MG/2ML IM SOLN
60.0000 mg | Freq: Once | INTRAMUSCULAR | Status: AC
  Administered 2012-10-20: 60 mg via INTRAMUSCULAR
  Filled 2012-10-20: qty 2

## 2012-10-20 MED ORDER — CYCLOBENZAPRINE HCL 10 MG PO TABS
10.0000 mg | ORAL_TABLET | Freq: Once | ORAL | Status: AC
  Administered 2012-10-20: 10 mg via ORAL
  Filled 2012-10-20: qty 1

## 2012-10-20 MED ORDER — NAPROXEN 500 MG PO TABS: 500.0000 mg | ORAL_TABLET | Freq: Two times a day (BID) | ORAL | Status: DC

## 2012-10-20 MED ORDER — CYCLOBENZAPRINE HCL 10 MG PO TABS: 10.0000 mg | ORAL_TABLET | Freq: Three times a day (TID) | ORAL | Status: DC | PRN

## 2012-10-20 MED ORDER — TRAMADOL HCL 50 MG PO TABS: 100.0000 mg | ORAL_TABLET | Freq: Four times a day (QID) | ORAL | Status: DC | PRN

## 2012-10-20 NOTE — ED Notes (Signed)
Informed patient that we were going to perform EKG as part of our chest pain protocol. Patient refused EKG stating, "I don't want an EKG because it's expensive and I hurt my ribs, it's not my heart."
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
## 2012-10-20 NOTE — ED Notes (Signed)
Patient reports fell in bathtub last night. Complaining of right-sided chest pain, worse with coughing.

## 2012-10-25 ENCOUNTER — Emergency Department: Payer: Self-pay | Admitting: Emergency Medicine

## 2012-12-25 ENCOUNTER — Emergency Department (HOSPITAL_COMMUNITY)
Admission: EM | Admit: 2012-12-25 | Discharge: 2012-12-25 | Disposition: A | Discharge status: Home / Self Care | Payer: Self-pay | Attending: Emergency Medicine | Admitting: Emergency Medicine

## 2012-12-25 ENCOUNTER — Encounter (HOSPITAL_COMMUNITY): Payer: Self-pay | Admitting: Emergency Medicine

## 2012-12-25 DIAGNOSIS — K089 Disorder of teeth and supporting structures, unspecified: Secondary | ICD-10-CM | POA: Insufficient documentation

## 2012-12-25 DIAGNOSIS — Z8739 Personal history of other diseases of the musculoskeletal system and connective tissue: Secondary | ICD-10-CM | POA: Insufficient documentation

## 2012-12-25 DIAGNOSIS — Z79899 Other long term (current) drug therapy: Secondary | ICD-10-CM | POA: Insufficient documentation

## 2012-12-25 DIAGNOSIS — Z8719 Personal history of other diseases of the digestive system: Secondary | ICD-10-CM | POA: Insufficient documentation

## 2012-12-25 DIAGNOSIS — Z791 Long term (current) use of non-steroidal anti-inflammatories (NSAID): Secondary | ICD-10-CM | POA: Insufficient documentation

## 2012-12-25 DIAGNOSIS — G8929 Other chronic pain: Secondary | ICD-10-CM | POA: Insufficient documentation

## 2012-12-25 DIAGNOSIS — K0889 Other specified disorders of teeth and supporting structures: Secondary | ICD-10-CM

## 2012-12-25 DIAGNOSIS — F319 Bipolar disorder, unspecified: Secondary | ICD-10-CM | POA: Insufficient documentation

## 2012-12-25 DIAGNOSIS — Z8619 Personal history of other infectious and parasitic diseases: Secondary | ICD-10-CM | POA: Insufficient documentation

## 2012-12-25 DIAGNOSIS — K029 Dental caries, unspecified: Secondary | ICD-10-CM | POA: Insufficient documentation

## 2012-12-25 DIAGNOSIS — F17200 Nicotine dependence, unspecified, uncomplicated: Secondary | ICD-10-CM | POA: Insufficient documentation

## 2012-12-25 DIAGNOSIS — Z8711 Personal history of peptic ulcer disease: Secondary | ICD-10-CM | POA: Insufficient documentation

## 2012-12-25 MED ORDER — PENICILLIN V POTASSIUM 250 MG PO TABS
500.0000 mg | ORAL_TABLET | Freq: Once | ORAL | Status: AC
  Administered 2012-12-25: 500 mg via ORAL
  Filled 2012-12-25: qty 2

## 2012-12-25 MED ORDER — TRAMADOL HCL 50 MG PO TABS: 50.0000 mg | ORAL_TABLET | Freq: Four times a day (QID) | ORAL | Status: DC | PRN

## 2012-12-25 MED ORDER — AMOXICILLIN 500 MG PO CAPS: 500.0000 mg | ORAL_CAPSULE | Freq: Three times a day (TID) | ORAL | Status: DC

## 2012-12-25 MED ORDER — TRAMADOL HCL 50 MG PO TABS
50.0000 mg | ORAL_TABLET | Freq: Once | ORAL | Status: AC
  Administered 2012-12-25: 50 mg via ORAL
  Filled 2012-12-25: qty 1

## 2012-12-25 NOTE — ED Notes (Signed)
Pt admits to taking Goody powders at home, denies dentist but states he has an appt with one on Monday, pt unable to state name of dentist

## 2012-12-25 NOTE — ED Provider Notes (Signed)
CSN: [id number]     Arrival date & time 12/25/12  1848 History   First MD Initiated Contact with Patient 12/25/12 1902     Chief Complaint  Patient presents with  . Dental Pain   (Consider location/radiation/quality/duration/timing/severity/associated sxs/prior Treatment) Patient is a 37 y.o. male presenting with tooth pain. The history is provided by the patient.  Dental Pain Location:  Lower Lower teeth location:  19/LL 1st molar and 20/LL 2nd bicuspid Quality:  Aching, sharp and throbbing Severity:  Moderate Onset quality:  Gradual Duration:  2 days Timing:  Constant Progression:  Worsening Chronicity:  Recurrent Context: dental caries and poor dentition   Context: not abscess, filling still in place and not trauma   Prior workup: None. Relieved by: Cold water. Worsened by:  Hot food/drink Ineffective treatments:  NSAIDs and topical anesthetic gel Associated symptoms: facial pain   Associated symptoms: no congestion, no difficulty swallowing, no drooling, no facial swelling, no fever, no gum swelling, no headaches, no neck pain, no neck swelling, no oral bleeding and no trismus   Risk factors: lack of dental care, periodontal disease and smoking   Risk factors: no diabetes     Past Medical History  Diagnosis Date  . Depression   . Bipolar 1 disorder   . ADHD (attention deficit hyperactivity disorder)   . Hepatitis C   . Chronic pain     chronic l leg pain  . Sciatica   . Degenerative disc disease, lumbar   . GERD (gastroesophageal reflux disease)   . History of stomach ulcers   . DDD (degenerative disc disease), lumbar   . Hepatitis C    Past Surgical History  Procedure Laterality Date  . Fracture surgery    . Tibia im nail insertion  01/27/2012    Procedure: INTRAMEDULLARY (IM) NAIL TIBIAL;  Surgeon: Kathryne Hitch, MD;  Location: [facility];  Service: Orthopedics;  Laterality: Left;  Removal of IM Rod and Screws Left Tibia with Exchange Nail, Allograft Bone  Graft Left Tibia  . Hardware removal  01/27/2012    Procedure: HARDWARE REMOVAL;  Surgeon: Kathryne Hitch, MD;  Location: [facility];  Service: Orthopedics;  Laterality: Left;   Family History  Problem Relation Age of Onset  . Diabetes Mother    History  Substance Use Topics  . Smoking status: Current Every Day Smoker -- 1.00 packs/day for 24 years    Types: Cigarettes  . Smokeless tobacco: Former Neurosurgeon    Quit date: 08/08/2006  . Alcohol Use: Yes     Comment: occasionally liquor    Review of Systems  Constitutional: Negative for fever and appetite change.  HENT: Positive for dental problem. Negative for congestion, drooling, facial swelling, sore throat and trouble swallowing.   Eyes: Negative for pain and visual disturbance.  Musculoskeletal: Negative for neck pain and neck stiffness.  Neurological: Negative for dizziness, facial asymmetry and headaches.  Hematological: Negative for adenopathy.  All other systems reviewed and are negative.    Allergies  Acetaminophen and Morphine and related  Home Medications   Current Outpatient Rx  Name  Route  Sig  Dispense  Refill  . amoxicillin (AMOXIL) 500 MG capsule   Oral   Take 1 capsule (500 mg total) by mouth 3 (three) times daily.   30 capsule   0   . Aspirin-Acetaminophen-Caffeine (GOODYS EXTRA STRENGTH PO)   Oral   Take 1 packet by mouth every 6 (six) hours as needed.         Marland Kitchen  Cyanocobalamin (VITAMIN B-12 PO)   Oral   Take 1 capsule by mouth daily.          . cyclobenzaprine (FLEXERIL) 10 MG tablet   Oral   Take 1 tablet (10 mg total) by mouth 2 (two) times daily as needed for muscle spasms.   30 tablet   0   . cyclobenzaprine (FLEXERIL) 10 MG tablet   Oral   Take 1 tablet (10 mg total) by mouth 3 (three) times daily as needed for muscle spasms.   30 tablet   0   . DULoxetine (CYMBALTA) 30 MG capsule   Oral   Take 30 mg by mouth at bedtime.         . naproxen (NAPROSYN) 500 MG tablet   Oral    Take 1 tablet (500 mg total) by mouth 2 (two) times daily.   30 tablet   0   . QUEtiapine Fumarate (SEROQUEL XR) 150 MG 24 hr tablet   Oral   Take 150 mg by mouth at bedtime.         . traMADol (ULTRAM) 50 MG tablet   Oral   Take 2 tablets (100 mg total) by mouth every 6 (six) hours as needed.   16 tablet   0   . traMADol (ULTRAM) 50 MG tablet   Oral   Take 1 tablet (50 mg total) by mouth every 6 (six) hours as needed.   15 tablet   0    BP 138/104  Pulse 116  Temp(Src) 98.1 F (36.7 C) (Oral)  Resp 18  Ht 5\' 3"  (1.6 m)  Wt 163 lb (73.936 kg)  BMI 28.88 kg/m2  SpO2 99%   Physical Exam  Nursing note and vitals reviewed. Constitutional: He is oriented to person, place, and time. He appears well-developed and well-nourished. No distress.  HENT:  Head: Normocephalic and atraumatic.  Right Ear: Tympanic membrane and ear canal normal.  Left Ear: Tympanic membrane and ear canal normal.  Mouth/Throat: Uvula is midline, oropharynx is clear and moist and mucous membranes are normal. No trismus in the jaw. Dental caries present. No dental abscesses or uvula swelling.    Dental caries of the left lower second bicuspid and first molar.  No facial swelling, obvious dental abscess, trismus, or sublingual abnml.  Patient has overall poor dentition  Neck: Normal range of motion. Neck supple.  Cardiovascular: Normal rate, regular rhythm and normal heart sounds.   No murmur heard. Pulmonary/Chest: Effort normal and breath sounds normal.  Musculoskeletal: Normal range of motion.  Lymphadenopathy:    He has no cervical adenopathy.  Neurological: He is alert and oriented to person, place, and time. He exhibits normal muscle tone. Coordination normal.  Skin: Skin is warm and dry.    ED Course  Procedures (including critical care time) Labs Review Labs Reviewed - No data to display Imaging Review No results found.  EKG Interpretation   None       MDM   1. Pain, dental      Patient is well appearing. No concerning symptoms for Ludwig's angina, or obvious dental abscess. Patient given referral information for dentistry and agrees to followup. Patient stable for discharge.   Debbera Wolken L. Trisha Mangle, PA-C 12/25/12 2036

## 2012-12-25 NOTE — ED Notes (Signed)
Pt c/o Lsided dental pain, onset 2 days ago.

## 2012-12-25 NOTE — ED Provider Notes (Signed)
Medical screening examination/treatment/procedure(s) were conducted as a shared visit with non-physician practitioner(s) and myself.  I personally evaluated the patient during the encounter.  EKG Interpretation   None         Benny Lennert, MD 12/25/12 2356

## 2012-12-25 NOTE — Discharge Instructions (Signed)
Dental Pain °Toothache is pain in or around a tooth. It may get worse with chewing or with cold or heat.  °HOME CARE °· Your dentist may use a numbing medicine during treatment. If so, you may need to avoid eating until the medicine wears off. Ask your dentist about this. °· Only take medicine as told by your dentist or doctor. °· Avoid chewing food near the painful tooth until after all treatment is done. Ask your dentist about this. °GET HELP RIGHT AWAY IF:  °· The problem gets worse or new problems appear. °· You have a fever. °· There is redness and puffiness (swelling) of the face, jaw, or neck. °· You cannot open your mouth. °· There is pain in the jaw. °· There is very bad pain that is not helped by medicine. °MAKE SURE YOU:  °· Understand these instructions. °· Will watch your condition. °· Will get help right away if you are not doing well or get worse. °Document Released: 06/16/2007 Document Revised: 03/22/2011 Document Reviewed: 06/16/2007 °[employer]® Patient Information ©2014 [employer]. ° °

## 2013-07-08 ENCOUNTER — Emergency Department (HOSPITAL_COMMUNITY)
Admission: EM | Admit: 2013-07-08 | Discharge: 2013-07-08 | Disposition: A | Discharge status: Home / Self Care | Payer: No Typology Code available for payment source | Attending: Emergency Medicine | Admitting: Emergency Medicine

## 2013-07-08 ENCOUNTER — Encounter (HOSPITAL_COMMUNITY): Payer: Self-pay | Admitting: Emergency Medicine

## 2013-07-08 DIAGNOSIS — F909 Attention-deficit hyperactivity disorder, unspecified type: Secondary | ICD-10-CM | POA: Insufficient documentation

## 2013-07-08 DIAGNOSIS — G8929 Other chronic pain: Secondary | ICD-10-CM | POA: Insufficient documentation

## 2013-07-08 DIAGNOSIS — Z8739 Personal history of other diseases of the musculoskeletal system and connective tissue: Secondary | ICD-10-CM | POA: Insufficient documentation

## 2013-07-08 DIAGNOSIS — F17200 Nicotine dependence, unspecified, uncomplicated: Secondary | ICD-10-CM | POA: Insufficient documentation

## 2013-07-08 DIAGNOSIS — Z8719 Personal history of other diseases of the digestive system: Secondary | ICD-10-CM | POA: Insufficient documentation

## 2013-07-08 DIAGNOSIS — Z79899 Other long term (current) drug therapy: Secondary | ICD-10-CM | POA: Insufficient documentation

## 2013-07-08 DIAGNOSIS — N23 Unspecified renal colic: Secondary | ICD-10-CM

## 2013-07-08 DIAGNOSIS — Z8619 Personal history of other infectious and parasitic diseases: Secondary | ICD-10-CM | POA: Insufficient documentation

## 2013-07-08 DIAGNOSIS — F319 Bipolar disorder, unspecified: Secondary | ICD-10-CM | POA: Insufficient documentation

## 2013-07-08 DIAGNOSIS — N200 Calculus of kidney: Secondary | ICD-10-CM | POA: Insufficient documentation

## 2013-07-08 LAB — URINALYSIS, ROUTINE W REFLEX MICROSCOPIC
BILIRUBIN URINE: NEGATIVE
Glucose, UA: NEGATIVE mg/dL
KETONES UR: NEGATIVE mg/dL
Leukocytes, UA: NEGATIVE
NITRITE: NEGATIVE
PROTEIN: 30 mg/dL — AB
Specific Gravity, Urine: 1.015 (ref 1.005–1.030)
UROBILINOGEN UA: 0.2 mg/dL (ref 0.0–1.0)
pH: 7 (ref 5.0–8.0)

## 2013-07-08 LAB — URINE MICROSCOPIC-ADD ON

## 2013-07-08 MED ORDER — OXYCODONE-ACETAMINOPHEN 5-325 MG PO TABS: 2.0000 | ORAL_TABLET | ORAL | Status: DC | PRN

## 2013-07-08 MED ORDER — FENTANYL CITRATE 0.05 MG/ML IJ SOLN
INTRAMUSCULAR | Status: AC
Start: 2013-07-08 — End: 2013-07-08
  Administered 2013-07-08: 50 ug via INTRAVENOUS
  Filled 2013-07-08: qty 2

## 2013-07-08 MED ORDER — ONDANSETRON HCL 4 MG/2ML IJ SOLN: 4.0000 mg | Freq: Once | INTRAMUSCULAR | Status: DC

## 2013-07-08 MED ORDER — OXYCODONE-ACETAMINOPHEN 5-325 MG PO TABS: 1.0000 | ORAL_TABLET | ORAL | Status: DC | PRN

## 2013-07-08 MED ORDER — HYDROMORPHONE HCL PF 2 MG/ML IJ SOLN
INTRAMUSCULAR | Status: AC
  Filled 2013-07-08: qty 1

## 2013-07-08 MED ORDER — ONDANSETRON HCL 4 MG/2ML IJ SOLN
INTRAMUSCULAR | Status: AC
  Filled 2013-07-08: qty 2

## 2013-07-08 MED ORDER — HYDROMORPHONE HCL PF 2 MG/ML IJ SOLN
2.0000 mg | Freq: Once | INTRAMUSCULAR | Status: AC
  Administered 2013-07-08: 2 mg via INTRAVENOUS

## 2013-07-08 MED ORDER — FENTANYL CITRATE 0.05 MG/ML IJ SOLN
50.0000 ug | Freq: Once | INTRAMUSCULAR | Status: AC
  Filled 2013-07-08: qty 2

## 2013-07-08 MED ORDER — TAMSULOSIN HCL 0.4 MG PO CAPS: ORAL_CAPSULE | ORAL | Status: DC

## 2013-07-08 NOTE — ED Notes (Signed)
Patient with no complaints at this time. Respirations even and unlabored. Skin warm/dry. Discharge instructions reviewed with patient at this time. Patient given opportunity to voice concerns/ask questions. IV removed per policy and band-aid applied to site. Patient discharged at this time and left Emergency Department with steady gait.  

## 2013-07-08 NOTE — ED Provider Notes (Signed)
CSN: [id number]     Arrival date & time 07/08/13  2001 History   This chart was scribed for Flint MelterElliott L Wentz, MD, by Yevette EdwardsAngela Bracken, ED Scribe. This patient was seen in room APA03/APA03 and the patient's care was started at 9:25 PM.  First MD Initiated Contact with Patient 07/08/13 2119     Chief Complaint  Patient presents with  . Nephrolithiasis    The history is provided by the patient. No language interpreter was used.   HPI Comments: Joshua Thomas is a 38 y.o. male who presents to the Emergency Department complaining of lower back pain,  onset of 3 days ago, and today moved to his right flank. He reports the pain waxes and wanes, and the pain radiates from his flank to his right testicle. He denies dysuria, frequency, fever, chills, nausea, or emesis.  The reports a h/o renal calculi several years ago, and he states the present symptoms are similar to past episodes.   Past Medical History  Diagnosis Date  . Depression   . Bipolar 1 disorder   . ADHD (attention deficit hyperactivity disorder)   . Hepatitis C   . Chronic pain     chronic l leg pain  . Sciatica   . Degenerative disc disease, lumbar   . GERD (gastroesophageal reflux disease)   . History of stomach ulcers   . DDD (degenerative disc disease), lumbar   . Hepatitis C    Past Surgical History  Procedure Laterality Date  . Fracture surgery    . Tibia im nail insertion  01/27/2012    Procedure: INTRAMEDULLARY (IM) NAIL TIBIAL;  Surgeon: Kathryne Hitchhristopher Y Blackman, MD;  Location: [facility];  Service: Orthopedics;  Laterality: Left;  Removal of IM Rod and Screws Left Tibia with Exchange Nail, Allograft Bone Graft Left Tibia  . Hardware removal  01/27/2012    Procedure: HARDWARE REMOVAL;  Surgeon: Kathryne Hitchhristopher Y Blackman, MD;  Location: [facility];  Service: Orthopedics;  Laterality: Left;   Family History  Problem Relation Age of Onset  . Diabetes Mother    History  Substance Use Topics  . Smoking status: Current Every Day  Smoker -- 1.00 packs/day for 24 years    Types: Cigarettes  . Smokeless tobacco: Former NeurosurgeonUser    Quit date: 08/08/2006  . Alcohol Use: Yes     Comment: occasionally liquor    Review of Systems  Constitutional: Negative for fever and chills.  Gastrointestinal: Negative for nausea and vomiting.  Genitourinary: Positive for flank pain and testicular pain. Negative for dysuria and hematuria.  Musculoskeletal: Positive for back pain.  All other systems reviewed and are negative.   Allergies  Acetaminophen and Morphine and related  Home Medications   Prior to Admission medications   Medication Sig Start Date End Date Taking? Authorizing Provider  Aspirin-Acetaminophen-Caffeine (GOODYS EXTRA STRENGTH PO) Take 1 packet by mouth every 6 (six) hours as needed (for pain).    Yes Historical Provider, MD  QUEtiapine Fumarate (SEROQUEL XR) 150 MG 24 hr tablet Take 150 mg by mouth at bedtime.   Yes Historical Provider, MD  oxyCODONE-acetaminophen (PERCOCET) 5-325 MG per tablet Take 2 tablets by mouth every 4 (four) hours as needed. 07/08/13   Flint MelterElliott L Wentz, MD  oxyCODONE-acetaminophen (PERCOCET/ROXICET) 5-325 MG per tablet Take 1 tablet by mouth every 4 (four) hours as needed. 07/08/13   Flint MelterElliott L Wentz, MD  tamsulosin (FLOMAX) 0.4 MG CAPS capsule 1 q HS to aid stone passage 07/08/13   Mechele CollinElliott  Rachel MouldsL Wentz, MD   Triage Vitals: BP 141/90  Pulse 111  Temp(Src) 98 F (36.7 C)  Resp 20  Ht 5\' 3"  (1.6 m)  Wt 165 lb (74.844 kg)  BMI 29.24 kg/m2  SpO2 100%  Physical Exam  Nursing note and vitals reviewed. Constitutional: He is oriented to person, place, and time. He appears well-developed and well-nourished. No distress.  HENT:  Head: Normocephalic and atraumatic.  Eyes: Conjunctivae and EOM are normal.  Neck: Neck supple. No tracheal deviation present.  Cardiovascular: Normal rate, regular rhythm and normal heart sounds.  Exam reveals no friction rub.   No murmur heard. Pulmonary/Chest: Effort  normal and breath sounds normal. No respiratory distress. He has no wheezes. He has no rales.  Abdominal: Soft. There is no tenderness. There is no rebound and no guarding.  Musculoskeletal: Normal range of motion.  Neurological: He is alert and oriented to person, place, and time.  Skin: Skin is warm and dry.  Psychiatric: He has a normal mood and affect. His behavior is normal.    ED Course  Procedures (including critical care time)  DIAGNOSTIC STUDIES: Oxygen Saturation is 100% on room air, normal by my interpretation.    COORDINATION OF CARE:  Medications  ondansetron (ZOFRAN) injection 4 mg (not administered)  fentaNYL (SUBLIMAZE) injection 50 mcg (50 mcg Intravenous Given 07/08/13 2048)  HYDROmorphone (DILAUDID) injection 2 mg (2 mg Intravenous Given 07/08/13 2113)   Patient Vitals for the past 24 hrs:  BP Temp Pulse Resp SpO2 Height Weight  07/08/13 2009 141/90 mmHg 98 F (36.7 C) 111 20 100 % 5\' 3"  (1.6 m) 165 lb (74.844 kg)   9:29 PM- Discussed treatment plan with patient, and the patient agreed to the plan. The plan includes pain medication and Flomax.   9:32 PM- Rechecked pt.   Labs Review Labs Reviewed  URINALYSIS, ROUTINE W REFLEX MICROSCOPIC - Abnormal; Notable for the following:    APPearance CLOUDY (*)    Hgb urine dipstick TRACE (*)    Protein, ur 30 (*)    All other components within normal limits  URINE MICROSCOPIC-ADD ON - Abnormal; Notable for the following:    Crystals CA OXALATE CRYSTALS (*)    All other components within normal limits     EKG Interpretation None      MDM   Final diagnoses:  Ureteral colic  Kidney stone    Evaluation consistent with ureteral colic from passing kidney stone. He is improved with initial treatment, in stable for discharge.  Nursing Notes Reviewed/ Care Coordinated Applicable Imaging Reviewed Interpretation of Laboratory Data incorporated into ED treatment  The patient appears reasonably screened and/or  stabilized for discharge and I doubt any other medical condition or other [facility] requiring further screening, evaluation, or treatment in the ED at this time prior to discharge.  Plan: Home Medications- Percocet, Flomax; Home Treatments- rest; return here if the recommended treatment, does not improve the symptoms; Recommended follow up- Urology f/u prn  I personally performed the services described in this documentation, which was scribed in my presence. The recorded information has been reviewed and is accurate.     Flint MelterElliott L Wentz, MD 07/09/13 [phone]

## 2013-07-08 NOTE — ED Notes (Signed)
Complains of kidney stones in the past, this is presenting as the same thing. With Nausea and sharp flank pain.

## 2013-07-08 NOTE — ED Notes (Deleted)
Patient stable at present.  Voicing no complaints. [facility] called for transport back to facility.

## 2013-07-08 NOTE — Discharge Instructions (Signed)
Kidney Stones  Kidney stones (urolithiasis) are deposits that form inside your kidneys. The intense pain is caused by the stone moving through the urinary tract. When the stone moves, the ureter goes into spasm around the stone. The stone is usually passed in the urine.   CAUSES   · A disorder that makes certain neck glands produce too much parathyroid hormone (primary hyperparathyroidism).  · A buildup of uric acid crystals, similar to gout in your joints.  · Narrowing (stricture) of the ureter.  · A kidney obstruction present at birth (congenital obstruction).  · Previous surgery on the kidney or ureters.  · Numerous kidney infections.  SYMPTOMS   · Feeling sick to your stomach (nauseous).  · Throwing up (vomiting).  · Blood in the urine (hematuria).  · Pain that usually spreads (radiates) to the groin.  · Frequency or urgency of urination.  DIAGNOSIS   · Taking a history and physical exam.  · Blood or urine tests.  · CT scan.  · Occasionally, an examination of the inside of the urinary bladder (cystoscopy) is performed.  TREATMENT   · Observation.  · Increasing your fluid intake.  · Extracorporeal shock wave lithotripsy--This is a noninvasive procedure that uses shock waves to break up kidney stones.  · Surgery may be needed if you have severe pain or persistent obstruction. There are various surgical procedures. Most of the procedures are performed with the use of small instruments. Only small incisions are needed to accommodate these instruments, so recovery time is minimized.  The size, location, and chemical composition are all important variables that will determine the proper choice of action for you. Talk to your health care provider to better understand your situation so that you will minimize the risk of injury to yourself and your kidney.   HOME CARE INSTRUCTIONS   · Drink enough water and fluids to keep your urine clear or pale yellow. This will help you to pass the stone or stone fragments.  · Strain  all urine through the provided strainer. Keep all particulate matter and stones for your health care provider to see. The stone causing the pain may be as small as a grain of salt. It is very important to use the strainer each and every time you pass your urine. The collection of your stone will allow your health care provider to analyze it and verify that a stone has actually passed. The stone analysis will often identify what you can do to reduce the incidence of recurrences.  · Only take over-the-counter or prescription medicines for pain, discomfort, or fever as directed by your health care provider.  · Make a follow-up appointment with your health care provider as directed.  · Get follow-up X-rays if required. The absence of pain does not always mean that the stone has passed. It may have only stopped moving. If the urine remains completely obstructed, it can cause loss of kidney function or even complete destruction of the kidney. It is your responsibility to make sure X-rays and follow-ups are completed. Ultrasounds of the kidney can show blockages and the status of the kidney. Ultrasounds are not associated with any radiation and can be performed easily in a matter of minutes.  SEEK MEDICAL CARE IF:  · You experience pain that is progressive and unresponsive to any pain medicine you have been prescribed.  SEEK IMMEDIATE MEDICAL CARE IF:   · Pain cannot be controlled with the prescribed medicine.  · You have a fever or   shaking chills.  · The severity or intensity of pain increases over 18 hours and is not relieved by pain medicine.  · You develop a new onset of abdominal pain.  · You feel faint or pass out.  · You are unable to urinate.  MAKE SURE YOU:   · Understand these instructions.  · Will watch your condition.  · Will get help right away if you are not doing well or get worse.  Document Released: 12/28/2004 Document Revised: 08/30/2012 Document Reviewed: 05/31/2012  ExitCare® Patient Information ©2015  [employer]. This information is not intended to replace advice given to you by your health care provider. Make sure you discuss any questions you have with your health care provider.    Dietary Guidelines to Help Prevent Kidney Stones  Your risk of kidney stones can be decreased by adjusting the foods you eat. The most important thing you can do is drink enough fluid. You should drink enough fluid to keep your urine clear or pale yellow. The following guidelines provide specific information for the type of kidney stone you have had.  GUIDELINES ACCORDING TO TYPE OF KIDNEY STONE  Calcium Oxalate Kidney Stones  · Reduce the amount of salt you eat. Foods that have a lot of salt cause your body to release excess calcium into your urine. The excess calcium can combine with a substance called oxalate to form kidney stones.  · Reduce the amount of animal protein you eat if the amount you eat is excessive. Animal protein causes your body to release excess calcium into your urine. Ask your dietitian how much protein from animal sources you should be eating.  · Avoid foods that are high in oxalates. If you take vitamins, they should have less than 500 mg of vitamin C. Your body turns vitamin C into oxalates. You do not need to avoid fruits and vegetables high in vitamin C.  Calcium Phosphate Kidney Stones  · Reduce the amount of salt you eat to help prevent the release of excess calcium into your urine.  · Reduce the amount of animal protein you eat if the amount you eat is excessive. Animal protein causes your body to release excess calcium into your urine. Ask your dietitian how much protein from animal sources you should be eating.  · Get enough calcium from food or take a calcium supplement (ask your dietitian for recommendations). Food sources of calcium that do not increase your risk of kidney stones include:  ¨ Broccoli.  ¨ Dairy products, such as cheese and yogurt.  ¨ Pudding.  Uric Acid Kidney Stones  · Do not  have more than 6 oz of animal protein per day.  FOOD SOURCES  Animal Protein Sources  · Meat (all types).  · Poultry.  · Eggs.  · Fish, seafood.  Foods High in Salt  · Salt seasonings.  · Soy sauce.  · Teriyaki sauce.  · Cured and processed meats.  · Salted crackers and snack foods.  · Fast food.  · Canned soups and most canned foods.  Foods High in Oxalates  · Grains:  ¨ Amaranth.  ¨ Barley.  ¨ Grits.  ¨ Wheat germ.  ¨ Bran.  ¨ Buckwheat flour.  ¨ All bran cereals.  ¨ Pretzels.  ¨ Whole wheat bread.  · Vegetables:  ¨ Beans (wax).  ¨ Beets and beet greens.  ¨ Collard greens.  ¨ Eggplant.  ¨ Escarole.  ¨ Leeks.  ¨ Okra.  ¨ Parsley.  ¨ Rutabagas.  ¨   Spinach.  ¨ Swiss chard.  ¨ Tomato paste.  ¨ Fried potatoes.  ¨ Sweet potatoes.  · Fruits:  ¨ Red currants.  ¨ Figs.  ¨ Kiwi.  ¨ Rhubarb.  · Meat and Other Protein Sources:  ¨ Beans (dried).  ¨ Soy burgers and other soybean products.  ¨ Miso.  ¨ Nuts (peanuts, almonds, pecans, cashews, hazelnuts).  ¨ Nut butters.  ¨ Sesame seeds and tahini (paste made of sesame seeds).  ¨ Poppy seeds.  · Beverages:  ¨ Chocolate drink mixes.  ¨ Soy milk.  ¨ Instant iced tea.  ¨ Juices made from high-oxalate fruits or vegetables.  · Other:  ¨ Carob.  ¨ Chocolate.  ¨ Fruitcake.  ¨ Marmalades.  Document Released: 04/24/2010 Document Revised: 01/02/2013 Document Reviewed: 11/24/2012  ExitCare® Patient Information ©2015 [employer]. This information is not intended to replace advice given to you by your health care provider. Make sure you discuss any questions you have with your health care provider.

## 2013-07-11 MED FILL — Oxycodone w/ Acetaminophen Tab 5-325 MG: ORAL | Qty: 6 | Status: AC

## 2013-07-22 ENCOUNTER — Emergency Department (HOSPITAL_COMMUNITY)
Admission: EM | Admit: 2013-07-22 | Discharge: 2013-07-22 | Disposition: A | Discharge status: Home / Self Care | Payer: No Typology Code available for payment source | Attending: Emergency Medicine | Admitting: Emergency Medicine

## 2013-07-22 ENCOUNTER — Encounter (HOSPITAL_COMMUNITY): Payer: Self-pay | Admitting: Emergency Medicine

## 2013-07-22 DIAGNOSIS — K047 Periapical abscess without sinus: Secondary | ICD-10-CM

## 2013-07-22 DIAGNOSIS — F319 Bipolar disorder, unspecified: Secondary | ICD-10-CM | POA: Insufficient documentation

## 2013-07-22 DIAGNOSIS — G8929 Other chronic pain: Secondary | ICD-10-CM | POA: Insufficient documentation

## 2013-07-22 DIAGNOSIS — Z8739 Personal history of other diseases of the musculoskeletal system and connective tissue: Secondary | ICD-10-CM | POA: Insufficient documentation

## 2013-07-22 DIAGNOSIS — Z8619 Personal history of other infectious and parasitic diseases: Secondary | ICD-10-CM | POA: Insufficient documentation

## 2013-07-22 DIAGNOSIS — F17200 Nicotine dependence, unspecified, uncomplicated: Secondary | ICD-10-CM | POA: Insufficient documentation

## 2013-07-22 DIAGNOSIS — Z8719 Personal history of other diseases of the digestive system: Secondary | ICD-10-CM | POA: Insufficient documentation

## 2013-07-22 DIAGNOSIS — K029 Dental caries, unspecified: Secondary | ICD-10-CM | POA: Insufficient documentation

## 2013-07-22 MED ORDER — OXYCODONE-ACETAMINOPHEN 5-325 MG PO TABS: 1.0000 | ORAL_TABLET | ORAL | Status: DC | PRN

## 2013-07-22 MED ORDER — DIPHENHYDRAMINE HCL 50 MG/ML IJ SOLN
25.0000 mg | Freq: Once | INTRAMUSCULAR | Status: AC
  Administered 2013-07-22: 25 mg via INTRAVENOUS
  Filled 2013-07-22: qty 1

## 2013-07-22 MED ORDER — CLINDAMYCIN PHOSPHATE 600 MG/50ML IV SOLN
600.0000 mg | Freq: Once | INTRAVENOUS | Status: AC
  Administered 2013-07-22: 600 mg via INTRAVENOUS
  Filled 2013-07-22: qty 50

## 2013-07-22 MED ORDER — AMOXICILLIN 500 MG PO CAPS: 500.0000 mg | ORAL_CAPSULE | Freq: Three times a day (TID) | ORAL | Status: DC

## 2013-07-22 MED ORDER — HYDROMORPHONE HCL PF 1 MG/ML IJ SOLN
1.0000 mg | Freq: Once | INTRAMUSCULAR | Status: AC
  Administered 2013-07-22: 1 mg via INTRAVENOUS
  Filled 2013-07-22: qty 1

## 2013-07-22 NOTE — ED Provider Notes (Signed)
CSN: [id number]     Arrival date & time 07/22/13  1945 History   First MD Initiated Contact with Patient 07/22/13 2050     Chief Complaint  Patient presents with  . Jaw Pain     (Consider location/radiation/quality/duration/timing/severity/associated sxs/prior Treatment) Patient is a 38 y.o. male presenting with tooth pain. The history is provided by the patient.  Dental Pain Location:  Lower Lower teeth location:  19/LL 1st molar, 20/LL 2nd bicuspid and 21/LL 1st bicuspid Quality:  Pressure-like and throbbing Severity:  Moderate Onset quality:  Gradual Duration:  1 day Timing:  Constant Progression:  Worsening Chronicity:  New Context: dental caries and poor dentition   Context: not trauma   Relieved by:  Nothing Worsened by:  Nothing tried Ineffective treatments:  None tried Associated symptoms: facial pain, facial swelling and gum swelling   Associated symptoms: no congestion, no difficulty swallowing, no drooling, no fever, no headaches, no neck pain, no neck swelling, no oral bleeding, no oral lesions and no trismus   Risk factors: lack of dental care, periodontal disease and smoking   Risk factors: no diabetes     Past Medical History  Diagnosis Date  . Depression   . Bipolar 1 disorder   . ADHD (attention deficit hyperactivity disorder)   . Hepatitis C   . Chronic pain     chronic l leg pain  . Sciatica   . Degenerative disc disease, lumbar   . GERD (gastroesophageal reflux disease)   . History of stomach ulcers   . DDD (degenerative disc disease), lumbar   . Hepatitis C    Past Surgical History  Procedure Laterality Date  . Fracture surgery    . Tibia im nail insertion  01/27/2012    Procedure: INTRAMEDULLARY (IM) NAIL TIBIAL;  Surgeon: Kathryne Hitch, MD;  Location: [facility];  Service: Orthopedics;  Laterality: Left;  Removal of IM Rod and Screws Left Tibia with Exchange Nail, Allograft Bone Graft Left Tibia  . Hardware removal  01/27/2012   Procedure: HARDWARE REMOVAL;  Surgeon: Kathryne Hitch, MD;  Location: [facility];  Service: Orthopedics;  Laterality: Left;   Family History  Problem Relation Age of Onset  . Diabetes Mother    History  Substance Use Topics  . Smoking status: Current Every Day Smoker -- 1.00 packs/day for 24 years    Types: Cigarettes  . Smokeless tobacco: Former Neurosurgeon    Quit date: 08/08/2006  . Alcohol Use: Yes     Comment: occasionally liquor    Review of Systems  Constitutional: Negative for fever and appetite change.  HENT: Positive for dental problem and facial swelling. Negative for congestion, drooling, mouth sores, sore throat and trouble swallowing.   Eyes: Negative for pain and visual disturbance.  Musculoskeletal: Negative for neck pain and neck stiffness.  Neurological: Negative for dizziness, facial asymmetry and headaches.  Hematological: Negative for adenopathy.  All other systems reviewed and are negative.     Allergies  Acetaminophen and Morphine and related  Home Medications   Prior to Admission medications   Medication Sig Start Date End Date Taking? Authorizing Provider  Aspirin-Acetaminophen-Caffeine (GOODYS EXTRA STRENGTH PO) Take 1 packet by mouth every 6 (six) hours as needed (for pain).    Yes Historical Provider, MD  oxyCODONE-acetaminophen (PERCOCET) 5-325 MG per tablet Take 2 tablets by mouth every 4 (four) hours as needed. 07/08/13  Yes Flint Melter, MD  QUEtiapine Fumarate (SEROQUEL XR) 150 MG 24 hr tablet Take 150 mg  by mouth at bedtime.   Yes Historical Provider, MD   BP 121/81  Pulse 106  Temp(Src) 98.1 F (36.7 C) (Oral)  Resp 16  Ht 5\' 3"  (1.6 m)  Wt 170 lb (77.111 kg)  BMI 30.12 kg/m2  SpO2 98% Physical Exam  Nursing note and vitals reviewed. Constitutional: He is oriented to person, place, and time. He appears well-developed and well-nourished. No distress.  HENT:  Head: Normocephalic and atraumatic.  Right Ear: Tympanic membrane and ear  canal normal.  Left Ear: Tympanic membrane and ear canal normal.  Mouth/Throat: Uvula is midline, oropharynx is clear and moist and mucous membranes are normal. No oral lesions. No trismus in the jaw. Dental caries present. No dental abscesses or uvula swelling.    ttp and dental caries of #19, 20 and 21.  Moderate facial swelling at the left mandible region, and ttp of the surrounding gums,  no fluctuance of the adjacent gums, trismus, or sublingual abnml.    Neck: Normal range of motion. Neck supple.  Cardiovascular: Normal rate, regular rhythm and normal heart sounds.   No murmur heard. Pulmonary/Chest: Effort normal and breath sounds normal. No respiratory distress.  Musculoskeletal: Normal range of motion.  Lymphadenopathy:    He has no cervical adenopathy.  Neurological: He is alert and oriented to person, place, and time. He exhibits normal muscle tone. Coordination normal.  Skin: Skin is warm and dry.    ED Course  Procedures (including critical care time) Labs Review Labs Reviewed - No data to display  Imaging Review No results found.   EKG Interpretation None      MDM   Final diagnoses:  Dental abscess    Patient is alert, non-toxic appearing.  Multiple dental caries and likely developing dental abscess.  Airway is patent, no concerning symptoms for infection to the deep structures of the neck or floor of the mouth.   Plan at this time is pain control and IV clindamycin.     I have advised pt that he needs close dental follow-up soon, Rx for amoxil.  Pt agrees to plan and referral info given for local dentistry.  Pt is feeling better and appears stable for d/c   Rashan Rounsaville L. Trisha Mangleriplett, PA-C 07/25/13 1749

## 2013-07-22 NOTE — ED Notes (Signed)
Pt woke up this morning with left jaw swollen and painful to touch, limited drinking or eating ability.

## 2013-07-22 NOTE — ED Notes (Signed)
Patient complaining of left lower jaw pain that started this morning. Patient has swollen left lower jaw, with limited movement, patient states "I have tasted some nasty stuff in it"

## 2013-07-22 NOTE — ED Notes (Signed)
Patient verbalizes understanding of discharge instructions, prescription medication medications, and follow up care. Patient ambulatory out of department at this time, escorted by family.

## 2013-07-22 NOTE — Discharge Instructions (Signed)
Abscess °An abscess (boil or furuncle) is an infected area on or under the skin. This area is filled with yellowish-white fluid (pus) and other material (debris). °HOME CARE  °· Only take medicines as told by your doctor. °· If you were given antibiotic medicine, take it as directed. Finish the medicine even if you start to feel better. °· If gauze is used, follow your doctor's directions for changing the gauze. °· To avoid spreading the infection: °¨ Keep your abscess covered with a bandage. °¨ Wash your hands well. °¨ Do not share personal care items, towels, or whirlpools with others. °¨ Avoid skin contact with others. °· Keep your skin and clothes clean around the abscess. °· Keep all doctor visits as told. °GET HELP RIGHT AWAY IF:  °· You have more pain, puffiness (swelling), or redness in the wound site. °· You have more fluid or blood coming from the wound site. °· You have muscle aches, chills, or you feel sick. °· You have a fever. °MAKE SURE YOU:  °· Understand these instructions. °· Will watch your condition. °· Will get help right away if you are not doing well or get worse. °Document Released: 06/16/2007 Document Revised: 06/29/2011 Document Reviewed: 03/12/2011 °ExitCare® Patient Information ©2015 [employer]. This information is not intended to replace advice given to you by your health care provider. Make sure you discuss any questions you have with your health care provider. ° °

## 2013-07-25 ENCOUNTER — Encounter (HOSPITAL_COMMUNITY): Payer: Self-pay | Admitting: Emergency Medicine

## 2013-07-25 ENCOUNTER — Emergency Department (HOSPITAL_COMMUNITY)
Admission: EM | Admit: 2013-07-25 | Discharge: 2013-07-25 | Disposition: A | Discharge status: Home / Self Care | Payer: Self-pay | Attending: Emergency Medicine | Admitting: Emergency Medicine

## 2013-07-25 ENCOUNTER — Emergency Department (HOSPITAL_COMMUNITY): Payer: Self-pay

## 2013-07-25 DIAGNOSIS — F319 Bipolar disorder, unspecified: Secondary | ICD-10-CM | POA: Insufficient documentation

## 2013-07-25 DIAGNOSIS — Z8619 Personal history of other infectious and parasitic diseases: Secondary | ICD-10-CM | POA: Insufficient documentation

## 2013-07-25 DIAGNOSIS — L0201 Cutaneous abscess of face: Secondary | ICD-10-CM | POA: Insufficient documentation

## 2013-07-25 DIAGNOSIS — G8929 Other chronic pain: Secondary | ICD-10-CM | POA: Insufficient documentation

## 2013-07-25 DIAGNOSIS — Z792 Long term (current) use of antibiotics: Secondary | ICD-10-CM | POA: Insufficient documentation

## 2013-07-25 DIAGNOSIS — L03211 Cellulitis of face: Secondary | ICD-10-CM

## 2013-07-25 DIAGNOSIS — K047 Periapical abscess without sinus: Secondary | ICD-10-CM | POA: Insufficient documentation

## 2013-07-25 DIAGNOSIS — Z8739 Personal history of other diseases of the musculoskeletal system and connective tissue: Secondary | ICD-10-CM | POA: Insufficient documentation

## 2013-07-25 DIAGNOSIS — F17200 Nicotine dependence, unspecified, uncomplicated: Secondary | ICD-10-CM | POA: Insufficient documentation

## 2013-07-25 LAB — BASIC METABOLIC PANEL
Anion gap: 10 (ref 5–15)
BUN: 11 mg/dL (ref 6–23)
CALCIUM: 9.6 mg/dL (ref 8.4–10.5)
CO2: 28 mEq/L (ref 19–32)
Chloride: 106 mEq/L (ref 96–112)
Creatinine, Ser: 0.77 mg/dL (ref 0.50–1.35)
GFR calc Af Amer: >90 mL/min (ref >90)
GFR calc non Af Amer: >90 mL/min (ref >90)
Glucose, Bld: 128 mg/dL — ABNORMAL HIGH (ref 70–99)
Potassium: 3.9 mEq/L (ref 3.7–5.3)
SODIUM: 144 meq/L (ref 137–147)

## 2013-07-25 LAB — CBC WITH DIFFERENTIAL/PLATELET
Basophils Absolute: 0 10*3/uL (ref 0.0–0.1)
Basophils Relative: 0 % (ref 0–1)
Eosinophils Absolute: 0 10*3/uL (ref 0.0–0.7)
Eosinophils Relative: 0 % (ref 0–5)
HEMATOCRIT: 46.3 % (ref 39.0–52.0)
HEMOGLOBIN: 15.6 g/dL (ref 13.0–17.0)
LYMPHS PCT: 17 % (ref 12–46)
Lymphs Abs: 1.5 10*3/uL (ref 0.7–4.0)
MCH: 31.7 pg (ref 26.0–34.0)
MCHC: 33.7 g/dL (ref 30.0–36.0)
MCV: 94.1 fL (ref 78.0–100.0)
Monocytes Absolute: 0.6 10*3/uL (ref 0.1–1.0)
Monocytes Relative: 7 % (ref 3–12)
Neutro Abs: 6.8 10*3/uL (ref 1.7–7.7)
Neutrophils Relative %: 76 % (ref 43–77)
PLATELETS: 197 10*3/uL (ref 150–400)
RBC: 4.92 MIL/uL (ref 4.22–5.81)
RDW: 12.9 % (ref 11.5–15.5)
WBC: 8.9 10*3/uL (ref 4.0–10.5)

## 2013-07-25 LAB — I-STAT CG4 LACTIC ACID, ED: LACTIC ACID, VENOUS: 1.07 mmol/L (ref 0.5–2.2)

## 2013-07-25 MED ORDER — POVIDONE-IODINE 10 % EX SOLN
CUTANEOUS | Status: AC
  Filled 2013-07-25: qty 118

## 2013-07-25 MED ORDER — HYDROMORPHONE HCL PF 1 MG/ML IJ SOLN
1.0000 mg | Freq: Once | INTRAMUSCULAR | Status: AC
  Administered 2013-07-25: 1 mg via INTRAVENOUS
  Filled 2013-07-25: qty 1

## 2013-07-25 MED ORDER — IOHEXOL 300 MG/ML  SOLN
80.0000 mL | Freq: Once | INTRAMUSCULAR | Status: AC | PRN
  Administered 2013-07-25: 80 mL via INTRAVENOUS

## 2013-07-25 MED ORDER — CLINDAMYCIN PHOSPHATE 600 MG/50ML IV SOLN
600.0000 mg | Freq: Once | INTRAVENOUS | Status: AC
  Administered 2013-07-25: 600 mg via INTRAVENOUS
  Filled 2013-07-25: qty 50

## 2013-07-25 MED ORDER — ONDANSETRON 8 MG PO TBDP
8.0000 mg | ORAL_TABLET | Freq: Once | ORAL | Status: AC
  Administered 2013-07-25: 8 mg via ORAL
  Filled 2013-07-25: qty 1

## 2013-07-25 MED ORDER — LIDOCAINE-EPINEPHRINE (PF) 1 %-1:200000 IJ SOLN
20.0000 mL | Freq: Once | INTRAMUSCULAR | Status: AC
  Administered 2013-07-25: 20 mL via INTRADERMAL

## 2013-07-25 MED ORDER — KETOROLAC TROMETHAMINE 30 MG/ML IJ SOLN
30.0000 mg | Freq: Once | INTRAMUSCULAR | Status: AC
  Administered 2013-07-25: 30 mg via INTRAVENOUS
  Filled 2013-07-25: qty 1

## 2013-07-25 MED ORDER — DIPHENHYDRAMINE HCL 50 MG/ML IJ SOLN
12.5000 mg | Freq: Once | INTRAMUSCULAR | Status: DC
  Filled 2013-07-25: qty 1

## 2013-07-25 MED ORDER — MORPHINE SULFATE 4 MG/ML IJ SOLN
4.0000 mg | Freq: Once | INTRAMUSCULAR | Status: DC
  Filled 2013-07-25: qty 1

## 2013-07-25 MED ORDER — SODIUM CHLORIDE 0.9 % IV BOLUS (SEPSIS)
1000.0000 mL | Freq: Once | INTRAVENOUS | Status: AC
  Administered 2013-07-25: 1000 mL via INTRAVENOUS

## 2013-07-25 MED ORDER — CLINDAMYCIN HCL 300 MG PO CAPS: 300.0000 mg | ORAL_CAPSULE | Freq: Three times a day (TID) | ORAL | Status: DC

## 2013-07-25 MED ORDER — OXYCODONE HCL 5 MG PO TABS: 5.0000 mg | ORAL_TABLET | ORAL | Status: DC | PRN

## 2013-07-25 NOTE — ED Provider Notes (Addendum)
TIME SEEN: 1:34 PM  CHIEF COMPLAINT: Left-sided facial swelling and pain  HPI: Patient is a 38 y.o. M with history of depression, bipolar disorder, hepatitis, chronic pain who presents emergency department with increasing left-sided facial swelling started several days ago. He was in the emergency room 2 days ago and started on penicillin for possible dental abscess. He states he has had some purulent drainage from his gums and has increasing swelling and pain. In fever, chills, nausea, vomiting or diarrhea. He is able to swallow his own secretions but has been spitting because he has had some drainage of pus into his mouth. He does not have a dentist but has [facility].  ROS: See HPI Constitutional: no fever  Eyes: no drainage  ENT: no runny nose   Cardiovascular:  no chest pain  Resp: no SOB  GI: no vomiting GU: no dysuria Integumentary: no rash  Allergy: no hives  Musculoskeletal: no leg swelling  Neurological: no slurred speech ROS otherwise negative  PAST MEDICAL HISTORY/PAST SURGICAL HISTORY:  Past Medical History  Diagnosis Date  . Depression   . Bipolar 1 disorder   . ADHD (attention deficit hyperactivity disorder)   . Hepatitis C   . Chronic pain     chronic l leg pain  . Sciatica   . Degenerative disc disease, lumbar   . GERD (gastroesophageal reflux disease)   . History of stomach ulcers   . DDD (degenerative disc disease), lumbar   . Hepatitis C     MEDICATIONS:  Prior to Admission medications   Medication Sig Start Date End Date Taking? Authorizing Provider  amoxicillin (AMOXIL) 500 MG capsule Take 1 capsule (500 mg total) by mouth 3 (three) times daily. 07/22/13  Yes Tammy L. Triplett, PA-C  oxyCODONE-acetaminophen (PERCOCET) 10-325 MG per tablet Take 1 tablet by mouth every 4 (four) hours as needed for pain.   Yes Historical Provider, MD  QUEtiapine Fumarate (SEROQUEL XR) 150 MG 24 hr tablet Take 150 mg by mouth at bedtime.   Yes  Historical Provider, MD    ALLERGIES:  Allergies  Allergen Reactions  . Acetaminophen Other (See Comments)    Due to Ulcers/ Liver Damage  . Morphine And Related Itching    SOCIAL HISTORY:  History  Substance Use Topics  . Smoking status: Current Every Day Smoker -- 1.00 packs/day for 24 years    Types: Cigarettes  . Smokeless tobacco: Former NeurosurgeonUser    Quit date: 08/08/2006  . Alcohol Use: Yes     Comment: occasionally liquor    FAMILY HISTORY: Family History  Problem Relation Age of Onset  . Diabetes Mother     EXAM: BP 141/110  Pulse 117  Temp(Src) 98.2 F (36.8 C) (Oral)  Resp 20  Ht 5\' 5"  (1.651 m)  Wt 170 lb (77.111 kg)  BMI 28.29 kg/m2  SpO2 100% CONSTITUTIONAL: Alert and oriented and responds appropriately to questions. Well-appearing; well-nourished HEAD: Normocephalic EYES: Conjunctivae clear, PERRL ENT: normal nose; no rhinorrhea; moist mucous membranes; pharynx without lesions noted, patient has multiple dental caries, he has left-sided facial swelling that goes into the submandibular region but no elevation of his tongue or signs of Ludwig's angina, there is a dental abscess is appreciated in the left lower gums next to the premolars and first molar that has active purulent drainage, patient is actively spitting and appears uncomfortable NECK: Supple, no meningismus, no LAD  CARD: Regular and tachycardic; S1 and S2 appreciated; no murmurs, no clicks, no  rubs, no gallops RESP: Normal chest excursion without splinting or tachypnea; breath sounds clear and equal bilaterally; no wheezes, no rhonchi, no rales,  ABD/GI: Normal bowel sounds; non-distended; soft, non-tender, no rebound, no guarding BACK:  The back appears normal and is non-tender to palpation, there is no CVA tenderness EXT: Normal ROM in all joints; non-tender to palpation; no edema; normal capillary refill; no cyanosis    SKIN: Normal color for age and race; warm NEURO: Moves all extremities  equally PSYCH: The patient's mood and manner are appropriate. Grooming and personal hygiene are appropriate.  MEDICAL DECISION MAKING: Patient here with dental abscess with concerns for spread. We'll obtain labs, lactate, CT of his face. We'll give IV clindamycin, pain and nausea medication.  ED PROGRESS: Patient has a left-sided facial cellulitis is secondary to left lower periapical dental abscess.  I have I&D'ed abscess at the level of the dome with some purulent drainage. Have discussed with patient that he needs to contact the [facility] to be seen as soon as possible as he will need to have this tooth from. He states he does not have insurance, therefore will have him continue taking penicillin if he cannot afford clindamycin. Have discussed strict return precautions and supportive care instructions. Patient verbalizes understanding and is comfortable plan.  INCISION AND DRAINAGE Performed by: Raelyn Number Consent: Verbal consent obtained. Risks and benefits: risks, benefits and alternatives were discussed Type: abscess  Body area: Left lower gum  Anesthesia: local infiltration  Incision was made with a scalpel.  Local anesthetic: lidocaine 1 % with epinephrine  Anesthetic total: 5 ml  Complexity: complex Blunt dissection to break up loculations  Drainage: purulent  Drainage amount: 5 mL    Patient tolerance: Patient tolerated the procedure well with no immediate complications.     Layla Maw Lurlean Kernen, DO 07/25/13 1535  Bobbie Valletta N Terralyn Matsumura, DO 07/25/13 1538

## 2013-07-25 NOTE — ED Notes (Signed)
Pt reports was seen for same on Monday. Pt reports was given IV abx, penecillin px on Monday and reports pain and swelling have not improved. Moderate facial swelling noted. Pt alert and oriented. Airway patent. Pt denies any n/v,fevers.

## 2013-07-25 NOTE — Discharge Instructions (Signed)
Dental Abscess A dental abscess is a collection of infected fluid (pus) from a bacterial infection in the inner part of the tooth (pulp). It usually occurs at the end of the tooth's root.  CAUSES   Severe tooth decay.  Trauma to the tooth that allows bacteria to enter into the pulp, such as a broken or chipped tooth. SYMPTOMS   Severe pain in and around the infected tooth.  Swelling and redness around the abscessed tooth or in the mouth or face.  Tenderness.  Pus drainage.  Bad breath.  Bitter taste in the mouth.  Difficulty swallowing.  Difficulty opening the mouth.  Nausea.  Vomiting.  Chills.  Swollen neck glands. DIAGNOSIS   A medical and dental history will be taken.  An examination will be performed by tapping on the abscessed tooth.  X-rays may be taken of the tooth to identify the abscess. TREATMENT The goal of treatment is to eliminate the infection. You may be prescribed antibiotic medicine to stop the infection from spreading. A root canal may be performed to save the tooth. If the tooth cannot be saved, it may be pulled (extracted) and the abscess may be drained.  HOME CARE INSTRUCTIONS  Only take over-the-counter or prescription medicines for pain, fever, or discomfort as directed by your caregiver.  Rinse your mouth (gargle) often with salt water ( tsp salt in 8 oz [250 ml] of warm water) to relieve pain or swelling.  Do not drive after taking pain medicine (narcotics).  Do not apply heat to the outside of your face.  Return to your dentist for further treatment as directed. SEEK MEDICAL CARE IF:  Your pain is not helped by medicine.  Your pain is getting worse instead of better. SEEK IMMEDIATE MEDICAL CARE IF:  You have a fever or persistent symptoms for more than 2-3 days.  You have a fever and your symptoms suddenly get worse.  You have chills or a very bad headache.  You have problems breathing or swallowing.  You have trouble  opening your mouth.  You have swelling in the neck or around the eye. Document Released: 12/28/2004 Document Revised: 09/22/2011 Document Reviewed: 04/07/2010 [facility] Patient Information 2015 [place], [place]. This information is not intended to replace advice given to you by your health care provider. Make sure you discuss any questions you have with your health care provider.   Cellulitis Cellulitis is an infection of the skin and the tissue beneath it. The infected area is usually red and tender. Cellulitis occurs most often in the arms and lower legs.  CAUSES  Cellulitis is caused by bacteria that enter the skin through cracks or cuts in the skin. The most common types of bacteria that cause cellulitis are Staphylococcus and Streptococcus. SYMPTOMS   Redness and warmth.  Swelling.  Tenderness or pain.  Fever. DIAGNOSIS  Your caregiver can usually determine what is wrong based on a physical exam. Blood tests may also be done. TREATMENT  Treatment usually involves taking an antibiotic medicine. HOME CARE INSTRUCTIONS   Take your antibiotics as directed. Finish them even if you start to feel better.  Keep the infected arm or leg elevated to reduce swelling.  Apply a warm cloth to the affected area up to 4 times per day to relieve pain.  Only take over-the-counter or prescription medicines for pain, discomfort, or fever as directed by your caregiver.  Keep all follow-up appointments as directed by your caregiver. SEEK MEDICAL CARE IF:   You notice red  streaks coming from the infected area.  Your red area gets larger or turns dark in color.  Your bone or joint underneath the infected area becomes painful after the skin has healed.  Your infection returns in the same area or another area.  You notice a swollen bump in the infected area.  You develop new symptoms. SEEK IMMEDIATE MEDICAL CARE IF:   You have a fever.  You feel very sleepy.  You develop vomiting or  diarrhea.  You have a general ill feeling (malaise) with muscle aches and pains. MAKE SURE YOU:   Understand these instructions.  Will watch your condition.  Will get help right away if you are not doing well or get worse. Document Released: 10/07/2004 Document Revised: 06/29/2011 Document Reviewed: 03/15/2011 [facility] Patient Information 2015 [place], [place]. This information is not intended to replace advice given to you by your health care provider. Make sure you discuss any questions you have with your health care provider.    Emergency Department Resource Guide 1) Find a Doctor and Pay Out of Pocket Although you won't have to find out who is covered by your insurance plan, it is a good idea to ask around and get recommendations. You will then need to call the office and see if the doctor you have chosen will accept you as a new patient and what types of options they offer for patients who are self-pay. Some doctors offer discounts or will set up payment plans for their patients who do not have insurance, but you will need to ask so you aren't surprised when you get to your appointment.  2) Contact Your Local Health Department Not all health departments have doctors that can see patients for sick visits, but many do, so it is worth a call to see if yours does. If you don't know where your local health department is, you can check in your phone book. The CDC also has a tool to help you locate your state's health department, and many state websites also have listings of all of their local health departments.  3) Find a Walk-in Clinic If your illness is not likely to be very severe or complicated, you may want to try a walk in clinic. These are popping up all over the country in pharmacies, drugstores, and shopping centers. They're usually staffed by nurse practitioners or physician assistants that have been trained to treat common illnesses and complaints. They're usually fairly quick and  inexpensive. However, if you have serious medical issues or chronic medical problems, these are probably not your best option.  No Primary Care Doctor: - Call Health Connect at  [phone] - they can help you locate a primary care doctor that  accepts your insurance, provides certain services, etc. - Physician Referral Service- [phone]  Chronic Pain Problems: Organization         Address  Phone   Notes  [facility]  [phone] Patients need to be referred by their primary care doctor.   Medication Assistance: Organization         Address  Phone   Notes  [facility] Medication [facility] [address] [place] [place], [place] [address] [phone] --Must be a resident of [facility] -- Must have NO insurance coverage whatsoever (no Medicaid/ Medicare, etc.) -- The pt. MUST have a primary care doctor that directs their care regularly and follows them in the community   [facility]ist  [phone]   [address]  (  [phone]    Agencies that provide inexpensive medical care: Organization         Address  Phone   Notes  [facility]  [phone]   [facility]    [phone]   [facility] [address] [place], [place] [address] [phone]   [facility] [address] [place], [place] [phone]   Planned Parenthood    [phone]   [facility]    [phone]   Community Health and [facility]  [address], [place] Phone:  [phone], Fax:  [phone] Hours of Operation:  9 am - 6 pm, M-F.  Also accepts Medicaid/Medicare and self-pay.  [facility]  [address], [place] Phone: [phone], Fax: [phone]. Hours of Operation:  8:30 am - 5:30 pm, M-F.  Also accepts Medicaid and self-pay.  [facility] [place] [address], [place] Phone: [phone]   [facility] [address] Natasha Bence [employer], [place] [phone], Ext. [phone] Mondays & Thursdays: 7-9 AM.  First 15 patients are seen on a first come, first serve basis.    Medicaid-accepting [facility] Providers:  Organization         Address  Phone   Notes  [facility] [address], [place] [phone] Also accepts self-pay patients.  [facility] [address] Laurell Josephs [place], [place]  [phone]   [facility] [address], [place] [phone]   [facility] [address], [place] [phone]   Renaye Rakers [address], [place]   [phone] Only accepts [place] Access [place] patients after they have their name applied to their card.   Self-Pay (no insurance) in [facility] [place]:  Organization         Address  Phone   Notes  Sickle Cell Patients, [facility] [address] [place], [place] [phone]   [facility] [address] [place], [place] [phone]   [facility]  [address], [place] [phone]   [facility]/Dr. Osei-Bonsu  [address], [place] or [address], [place] [phone] Phone number for both [place] and [place] locations is the same.  [facility]gent Medical and [facility] [address] [place] [phone]   [facility] [address], [place] or [address] [phone]   [facility] [address], [place] [phone], phone; [phone], fax Sees patients 1st and 3rd Saturday of every month.  Must not qualify for public or private insurance (i.e. Medicaid, Medicare, [place] Health Choice, Veterans' Benefits)  Household income should be no more than 200% of the poverty level The clinic cannot treat you if you are pregnant or  think you are pregnant  Sexually transmitted diseases are not treated at the clinic.    Dental Care: Organization         Address  Phone  Notes  [facility] Department of [facility] [address] [place], [place] [phone] Accepts children up to age 58 who are enrolled in [place] or [place] Health Choice; pregnant women with a Medicaid card; and children who  have applied for Medicaid or [place] Health Choice, but were declined, whose parents can pay a reduced fee at time of service.  [facility] [place]  [address], [place] [phone] Accepts children up to age 38 who are enrolled in [place] or [place] Health Choice; pregnant women with a Medicaid card; and children who have applied for Medicaid or [place] Health Choice, but were declined, whose parents can pay a reduced fee at time of service.  [facility]  [address], [place] [phone] Patients are seen by appointment only. Walk-ins are not accepted. [facility] will see patients 38 years of age and older. Monday - Tuesday (8am-5pm) Most Wednesdays (8:30-5pm) $30 per visit, cash only  [facility]  [address], [facility] [place] [phone] Patients are seen by appointment only. Walk-ins are not accepted. [facility] will see patients 38 years of age and older. One Wednesday Evening (Monthly: Volunteer Based).  $30 per visit, cash only  [employer] [facility]  [phone] for adults; Children under age 524, call Graduate Pediatric Dentistry at [phone]. Children aged 754-14, please call [phone]) [phone] to request a pediatric application.  Dental services are provided in all areas of dental care including fillings, crowns and bridges, complete and partial dentures, implants, gum treatment, root canals, and extractions. Preventive care is also provided. Treatment is provided to both adults  and children. Patients are selected via a lottery and there is often a waiting list.   [facility] [address], [place]  [phone] [email or web address]   [facility] [address], [place], [place] [phone], Ext. [phone] Second and Fourth Thursday of each month, opens at 6:30 AM; Clinic ends at 9 AM.  Patients are seen on a first-come first-served basis, and a limited number are seen during each clinic.   [facility]  [address], [place], [place] [phone]) [phone]   Eligibility Requirements You must have lived in [place], [place], or [place] counties for at least the last three months.   You cannot be eligible for state or federal sponsored National Cityhealthcare insurance, including CIGNAVeterans Administration, [place]icaid, or [employer]Medicare.   You generally cannot be eligible for healthcare insurance through your employer.    How to apply: Eligibility screenings are held every Tuesday and Wednesday afternoon from 1:00 pm until 4:00 pm. You do not need an appointment for the interview!  [facility] [address], [place], [place] [phone]   [facility]  [phone]   [facility]  [phone]   [facility]  [phone]    Behavioral Health Resources in the Community: Intensive Outpatient Programs Organization         Address  Phone  Notes  [facility] [address], [place], [place] [phone]   [facility] [address], [place], [place] [phone]   [facility] [address], [place], [place]  [phone]   [facility] [address],  [place], [place] [phone] or [phone]   Substance Abuse Resources Organization         Address  Phone  Notes  [facility] Services  [phone]   [facility]  [phone]   [facility]  [phone]     [facility]  [phone]   Residential &  Outpatient Substance Abuse Program  [phone]   Psychological Services Organization         Address  Phone  Notes  [facility]  [phone]   [facility] Services  [phone]   [facility] [phone] [address] [place] [phone] or [phone]    Mobile Crisis Teams Organization         Address  Phone  Notes  [facility], Mobile Crisis Care Unit  [phone]   [facility]  [address] [place], [place] [phone]   Doristine Locks [address] [place] [phone]    Self-Help/Support Groups Organization         Address  Phone             Notes  [facility] [place] - variety of support groups  336- I7437963 Call for more information  Narcotics Anonymous (NA), [facility] [address], [employer] [place]  2 meetings at this location   Statistician         Address  Phone  Notes  [facility] [address],    [place]  [phone]   [facility]  [address] [place] [address], [place], [place] [phone]   [facility] [address] [place], [place] [phone] Admissions: 8am-3pm M-F  [facility] [address],    [place], [place] [phone]   [facility] [address] [place], [place], [place] [phone]   [facility] [address],  [place], [place] [phone]   [facility] - Intensive Outpatient [address]ure[address], [place], [place] [phone]   [facility] ([facility] [address] [place]  [place], [place] [phone] or [phone]   [facility] (RTS) [address] [place], [place] [phone] Accepts Medicaid  [facility] [address],  [place] [phone] Substance Abuse/Addiction Treatment   [facility] Organization         Address  Phone  Notes  [facility]  3124116244   Angie Fava, PhD [address] Ervin Knack [place], [place]   [phone] or [phone]   [facility]   [address] [place], [place] [phone]   [facility] [address], [place], [place] [phone] Insurance/Medicaid/sponsorship through [facility] [address]                                    [place], [place] [phone] Therapy/tele-psych/case  [facility] [address], [place] [phone]    Dr. Lolly Mustache  [phone]   [facility]  [employer] [facility] Dept. 1) [address], [place] 2) [address] [name] 3)  [address], [name] [phone]  [phone]   [facility] Child Abuse Hotline 613-320-6571 or [phone] (After Hours)

## 2013-07-25 NOTE — Care Management Note (Signed)
Pt here earlier this week and given ABX that has  Not helped. He has no insurance, and no money to pay for second ABX. Assisted with ABX through the [facility].

## 2013-07-25 NOTE — ED Notes (Signed)
Pt having trouble  swallowing

## 2013-07-25 NOTE — ED Notes (Signed)
Pain to left lower jaw and left neck.  Rating 10 on scale of 0-10.  Took PCN Sunday and not getting better [facility]

## 2013-07-26 NOTE — ED Provider Notes (Signed)
Medical screening examination/treatment/procedure(s) were performed by non-physician practitioner and as supervising physician I was immediately available for consultation/collaboration.   EKG Interpretation None        Rhyse Skowron L Solene Hereford, MD 07/26/13 1616 

## 2013-07-30 MED FILL — Oxycodone w/ Acetaminophen Tab 5-325 MG: ORAL | Qty: 6 | Status: AC

## 2013-09-03 ENCOUNTER — Emergency Department: Payer: Self-pay | Admitting: Student

## 2013-09-24 ENCOUNTER — Emergency Department: Payer: Self-pay | Admitting: Emergency Medicine

## 2013-09-24 LAB — URINALYSIS, COMPLETE
BACTERIA: NONE SEEN
Bilirubin,UR: NEGATIVE
Glucose,UR: NEGATIVE mg/dL (ref 0–75)
Ketone: NEGATIVE
Leukocyte Esterase: NEGATIVE
NITRITE: NEGATIVE
PH: 6 (ref 4.5–8.0)
Protein: 100
RBC,UR: 105 /HPF (ref 0–5)
SPECIFIC GRAVITY: 1.018 (ref 1.003–1.030)
Squamous Epithelial: NONE SEEN

## 2013-09-24 LAB — CBC
HCT: 49.6 % (ref 40.0–52.0)
HGB: 16.3 g/dL (ref 13.0–18.0)
MCH: 31.4 pg (ref 26.0–34.0)
MCHC: 32.9 g/dL (ref 32.0–36.0)
MCV: 96 fL (ref 80–100)
PLATELETS: 149 10*3/uL — AB (ref 150–440)
RBC: 5.19 10*6/uL (ref 4.40–5.90)
RDW: 14.1 % (ref 11.5–14.5)
WBC: 11 10*3/uL — AB (ref 3.8–10.6)

## 2013-09-24 LAB — BASIC METABOLIC PANEL
ANION GAP: 6 — AB (ref 7–16)
BUN: 15 mg/dL (ref 7–18)
CALCIUM: 9.5 mg/dL (ref 8.5–10.1)
CHLORIDE: 103 mmol/L (ref 98–107)
Co2: 29 mmol/L (ref 21–32)
Creatinine: 1.1 mg/dL (ref 0.60–1.30)
EGFR (African American): >60
EGFR (Non-African Amer.): >60
Glucose: 162 mg/dL — ABNORMAL HIGH (ref 65–99)
OSMOLALITY: 280 (ref 275–301)
Potassium: 3.6 mmol/L (ref 3.5–5.1)
Sodium: 138 mmol/L (ref 136–145)

## 2013-09-28 ENCOUNTER — Emergency Department: Payer: Self-pay | Admitting: Emergency Medicine

## 2013-09-28 LAB — CBC WITH DIFFERENTIAL/PLATELET
BASOS PCT: 0.6 %
Basophil #: 0 10*3/uL (ref 0.0–0.1)
EOS PCT: 2.8 %
Eosinophil #: 0.2 10*3/uL (ref 0.0–0.7)
HCT: 50.8 % (ref 40.0–52.0)
HGB: 16.6 g/dL (ref 13.0–18.0)
Lymphocyte #: 1.8 10*3/uL (ref 1.0–3.6)
Lymphocyte %: 29.8 %
MCH: 31.4 pg (ref 26.0–34.0)
MCHC: 32.7 g/dL (ref 32.0–36.0)
MCV: 96 fL (ref 80–100)
Monocyte #: 0.6 x10 3/mm (ref 0.2–1.0)
Monocyte %: 9.9 %
Neutrophil #: 3.4 10*3/uL (ref 1.4–6.5)
Neutrophil %: 56.9 %
Platelet: 158 10*3/uL (ref 150–440)
RBC: 5.29 10*6/uL (ref 4.40–5.90)
RDW: 13.8 % (ref 11.5–14.5)
WBC: 6 10*3/uL (ref 3.8–10.6)

## 2013-09-28 LAB — URINALYSIS, COMPLETE
BILIRUBIN, UR: NEGATIVE
Bacteria: NONE SEEN
Blood: NEGATIVE
Glucose,UR: NEGATIVE mg/dL (ref 0–75)
Ketone: NEGATIVE
Leukocyte Esterase: NEGATIVE
Nitrite: NEGATIVE
PH: 6 (ref 4.5–8.0)
RBC,UR: 5 /HPF (ref 0–5)
SPECIFIC GRAVITY: 1.017 (ref 1.003–1.030)
WBC UR: 3 /HPF (ref 0–5)

## 2013-09-28 LAB — COMPREHENSIVE METABOLIC PANEL
Albumin: 3.9 g/dL (ref 3.4–5.0)
Alkaline Phosphatase: 54 U/L
Anion Gap: 8 (ref 7–16)
BUN: 13 mg/dL (ref 7–18)
Bilirubin,Total: 0.5 mg/dL (ref 0.2–1.0)
CALCIUM: 9.1 mg/dL (ref 8.5–10.1)
CHLORIDE: 111 mmol/L — AB (ref 98–107)
CO2: 23 mmol/L (ref 21–32)
CREATININE: 0.93 mg/dL (ref 0.60–1.30)
EGFR (African American): >60
EGFR (Non-African Amer.): >60
GLUCOSE: 112 mg/dL — AB (ref 65–99)
Osmolality: 284 (ref 275–301)
POTASSIUM: 4.2 mmol/L (ref 3.5–5.1)
SGOT(AST): 99 U/L — ABNORMAL HIGH (ref 15–37)
SGPT (ALT): 241 U/L — ABNORMAL HIGH
Sodium: 142 mmol/L (ref 136–145)
Total Protein: 8 g/dL (ref 6.4–8.2)

## 2013-09-28 LAB — LIPASE, BLOOD: Lipase: 73 U/L (ref 73–393)

## 2013-11-18 ENCOUNTER — Emergency Department (HOSPITAL_COMMUNITY): Payer: Self-pay

## 2013-11-18 ENCOUNTER — Encounter (HOSPITAL_COMMUNITY): Payer: Self-pay | Admitting: Emergency Medicine

## 2013-11-18 ENCOUNTER — Emergency Department (HOSPITAL_COMMUNITY)
Admission: EM | Admit: 2013-11-18 | Discharge: 2013-11-18 | Disposition: A | Discharge status: Home / Self Care | Payer: Self-pay | Attending: Emergency Medicine | Admitting: Emergency Medicine

## 2013-11-18 DIAGNOSIS — Z8619 Personal history of other infectious and parasitic diseases: Secondary | ICD-10-CM | POA: Insufficient documentation

## 2013-11-18 DIAGNOSIS — Z720 Tobacco use: Secondary | ICD-10-CM | POA: Insufficient documentation

## 2013-11-18 DIAGNOSIS — Z792 Long term (current) use of antibiotics: Secondary | ICD-10-CM | POA: Insufficient documentation

## 2013-11-18 DIAGNOSIS — R109 Unspecified abdominal pain: Secondary | ICD-10-CM

## 2013-11-18 DIAGNOSIS — F319 Bipolar disorder, unspecified: Secondary | ICD-10-CM | POA: Insufficient documentation

## 2013-11-18 DIAGNOSIS — R1031 Right lower quadrant pain: Secondary | ICD-10-CM | POA: Insufficient documentation

## 2013-11-18 DIAGNOSIS — G8929 Other chronic pain: Secondary | ICD-10-CM | POA: Insufficient documentation

## 2013-11-18 DIAGNOSIS — Z8739 Personal history of other diseases of the musculoskeletal system and connective tissue: Secondary | ICD-10-CM | POA: Insufficient documentation

## 2013-11-18 DIAGNOSIS — Z8719 Personal history of other diseases of the digestive system: Secondary | ICD-10-CM | POA: Insufficient documentation

## 2013-11-18 LAB — COMPREHENSIVE METABOLIC PANEL
ALT: 183 U/L — ABNORMAL HIGH (ref 0–53)
ANION GAP: 11 (ref 5–15)
AST: 94 U/L — ABNORMAL HIGH (ref 0–37)
Albumin: 3.9 g/dL (ref 3.5–5.2)
Alkaline Phosphatase: 53 U/L (ref 39–117)
BILIRUBIN TOTAL: 0.3 mg/dL (ref 0.3–1.2)
BUN: 8 mg/dL (ref 6–23)
CHLORIDE: 103 meq/L (ref 96–112)
CO2: 28 meq/L (ref 19–32)
CREATININE: 0.95 mg/dL (ref 0.50–1.35)
Calcium: 9.8 mg/dL (ref 8.4–10.5)
GFR calc Af Amer: >90 mL/min (ref >90)
Glucose, Bld: 129 mg/dL — ABNORMAL HIGH (ref 70–99)
Potassium: 4 mEq/L (ref 3.7–5.3)
Sodium: 142 mEq/L (ref 137–147)
Total Protein: 8 g/dL (ref 6.0–8.3)

## 2013-11-18 LAB — URINALYSIS, ROUTINE W REFLEX MICROSCOPIC
Bilirubin Urine: NEGATIVE
Glucose, UA: NEGATIVE mg/dL
KETONES UR: NEGATIVE mg/dL
LEUKOCYTES UA: NEGATIVE
NITRITE: NEGATIVE
Specific Gravity, Urine: <1.005 — ABNORMAL LOW (ref 1.005–1.030)
Urobilinogen, UA: 0.2 mg/dL (ref 0.0–1.0)
pH: 6 (ref 5.0–8.0)

## 2013-11-18 LAB — URINE MICROSCOPIC-ADD ON

## 2013-11-18 LAB — CBC WITH DIFFERENTIAL/PLATELET
Basophils Absolute: 0 10*3/uL (ref 0.0–0.1)
Basophils Relative: 0 % (ref 0–1)
Eosinophils Absolute: 0.1 10*3/uL (ref 0.0–0.7)
Eosinophils Relative: 1 % (ref 0–5)
HEMATOCRIT: 48.9 % (ref 39.0–52.0)
Hemoglobin: 17.2 g/dL — ABNORMAL HIGH (ref 13.0–17.0)
LYMPHS PCT: 27 % (ref 12–46)
Lymphs Abs: 2.3 10*3/uL (ref 0.7–4.0)
MCH: 33 pg (ref 26.0–34.0)
MCHC: 35.2 g/dL (ref 30.0–36.0)
MCV: 93.7 fL (ref 78.0–100.0)
MONO ABS: 0.5 10*3/uL (ref 0.1–1.0)
Monocytes Relative: 6 % (ref 3–12)
NEUTROS ABS: 5.4 10*3/uL (ref 1.7–7.7)
Neutrophils Relative %: 66 % (ref 43–77)
Platelets: 175 10*3/uL (ref 150–400)
RBC: 5.22 MIL/uL (ref 4.22–5.81)
RDW: 12.8 % (ref 11.5–15.5)
WBC: 8.2 10*3/uL (ref 4.0–10.5)

## 2013-11-18 MED ORDER — HYDROMORPHONE HCL 1 MG/ML IJ SOLN
1.0000 mg | Freq: Once | INTRAMUSCULAR | Status: AC
  Administered 2013-11-18: 1 mg via INTRAVENOUS
  Filled 2013-11-18: qty 1

## 2013-11-18 MED ORDER — ONDANSETRON HCL 4 MG/2ML IJ SOLN
4.0000 mg | Freq: Once | INTRAMUSCULAR | Status: AC
  Administered 2013-11-18: 4 mg via INTRAVENOUS
  Filled 2013-11-18: qty 2

## 2013-11-18 MED ORDER — OXYCODONE-ACETAMINOPHEN 5-325 MG PO TABS
1.0000 | ORAL_TABLET | Freq: Once | ORAL | Status: AC
  Administered 2013-11-18: 1 via ORAL
  Filled 2013-11-18: qty 1

## 2013-11-18 NOTE — ED Notes (Signed)
Pt states he has been having bloody stools as well, EDP made aware

## 2013-11-18 NOTE — ED Notes (Signed)
Pt is requesting to see EDP before being discharged; EDP is in code at this time and pt is aware of wait time.

## 2013-11-18 NOTE — ED Notes (Signed)
Pt reports RLQ pain. Pt reports has been dealing with several kidney stones for last few weeks. Pt denies any n/v/d. Pt reports difficulty urination.

## 2013-11-18 NOTE — ED Provider Notes (Signed)
CSN: [id number]     Arrival date & time 11/18/13  1805 History  This chart was scribed for American Expressathan R. Rubin PayorPickering, MD by Evon Slackerrance Branch, ED Scribe. This patient was seen in room APA03/APA03 and the patient's care was started at 6:26 PM.     Chief Complaint  Patient presents with  . Abdominal Pain   Patient is a 38 y.o. male presenting with abdominal pain. The history is provided by the patient. No language interpreter was used.  Abdominal Pain Associated symptoms: no diarrhea, no fever, no nausea and no vomiting    HPI Comments: Naida SleightLee W Torosian is a 38 y.o. male who presents to the Emergency Department complaining of progressively worsening sharp RLQ abdominal pain. He states that the pain started in his back and is radiating to hit abdomen. He states that he has associated difficulty urinating. He states that he has a Hx of kidney stones. He states that during his last kidney stone taking Flomax helped him pass the stone faster. He denies fever, n/v/d.    Past Medical History  Diagnosis Date  . Depression   . Bipolar 1 disorder   . ADHD (attention deficit hyperactivity disorder)   . Hepatitis C   . Chronic pain     chronic l leg pain  . Sciatica   . Degenerative disc disease, lumbar   . GERD (gastroesophageal reflux disease)   . History of stomach ulcers   . DDD (degenerative disc disease), lumbar   . Hepatitis C    Past Surgical History  Procedure Laterality Date  . Fracture surgery    . Tibia im nail insertion  01/27/2012    Procedure: INTRAMEDULLARY (IM) NAIL TIBIAL;  Surgeon: Kathryne Hitchhristopher Y Blackman, MD;  Location: [facility];  Service: Orthopedics;  Laterality: Left;  Removal of IM Rod and Screws Left Tibia with Exchange Nail, Allograft Bone Graft Left Tibia  . Hardware removal  01/27/2012    Procedure: HARDWARE REMOVAL;  Surgeon: Kathryne Hitchhristopher Y Blackman, MD;  Location: [facility];  Service: Orthopedics;  Laterality: Left;   Family History  Problem Relation Age of Onset  . Diabetes  Mother    History  Substance Use Topics  . Smoking status: Current Every Day Smoker -- 1.00 packs/day for 24 years    Types: Cigarettes  . Smokeless tobacco: Former NeurosurgeonUser    Quit date: 08/08/2006  . Alcohol Use: Yes     Comment: occasionally liquor    Review of Systems  Constitutional: Negative for fever.  Gastrointestinal: Positive for abdominal pain. Negative for nausea, vomiting and diarrhea.  Genitourinary: Positive for difficulty urinating.  Musculoskeletal: Positive for back pain.     Allergies  Acetaminophen and Morphine and related  Home Medications   Prior to Admission medications   Medication Sig Start Date End Date Taking? Authorizing Provider  Aspirin-Acetaminophen-Caffeine 260-130-16 MG TABS Take 1 packet by mouth every 4 (four) hours as needed (Pain).   Yes Historical Provider, MD  QUEtiapine (SEROQUEL) 100 MG tablet Take 100 mg by mouth at bedtime.   Yes Historical Provider, MD  amoxicillin (AMOXIL) 500 MG capsule Take 1 capsule (500 mg total) by mouth 3 (three) times daily. Patient not taking: Reported on 11/18/2013 07/22/13   Tammy L. Triplett, PA-C  clindamycin (CLEOCIN) 300 MG capsule Take 1 capsule (300 mg total) by mouth 3 (three) times daily. Patient not taking: Reported on 11/18/2013 07/25/13   Kristen N Ward, DO  oxyCODONE (ROXICODONE) 5 MG immediate release tablet Take 1 tablet (5 mg  total) by mouth every 4 (four) hours as needed for severe pain. Patient not taking: Reported on 11/18/2013 07/25/13   Layla Maw Ward, DO   Triage Vitals: BP 126/84 mmHg  Pulse 117  Temp(Src) 99.2 F (37.3 C) (Oral)  Resp 24  Ht 5\' 3"  (1.6 m)  Wt 167 lb (75.751 kg)  BMI 29.59 kg/m2  SpO2 100%  Physical Exam  Constitutional: He is oriented to person, place, and time. He appears well-developed and well-nourished.  Miserable looking.   HENT:  Head: Normocephalic and atraumatic.  Eyes: Conjunctivae and EOM are normal.  Neck: Neck supple. No tracheal deviation present.   Cardiovascular: Normal rate.   Pulmonary/Chest: Effort normal. No respiratory distress.  Abdominal: There is tenderness in the right lower quadrant. There is CVA tenderness (right).  Musculoskeletal: Normal range of motion.  Neurological: He is alert and oriented to person, place, and time.  Skin: Skin is warm and dry. No rash noted.  Psychiatric: He has a normal mood and affect. His behavior is normal.  Nursing note and vitals reviewed.   ED Course  Procedures (including critical care time)  Labs Review Labs Reviewed  CBC WITH DIFFERENTIAL - Abnormal; Notable for the following:    Hemoglobin 17.2 (*)    All other components within normal limits  COMPREHENSIVE METABOLIC PANEL - Abnormal; Notable for the following:    Glucose, Bld 129 (*)    AST 94 (*)    ALT 183 (*)    All other components within normal limits  URINALYSIS, ROUTINE W REFLEX MICROSCOPIC - Abnormal; Notable for the following:    Specific Gravity, Urine <1.005 (*)    Hgb urine dipstick SMALL (*)    Protein, ur TRACE (*)    All other components within normal limits  URINE MICROSCOPIC-ADD ON    Imaging Review Ct Abdomen Pelvis Wo Contrast  11/18/2013   CLINICAL DATA:  Worsening right lower quadrant abdominal pain. History of renal stones.  EXAM: CT ABDOMEN AND PELVIS WITHOUT CONTRAST  TECHNIQUE: Multidetector CT imaging of the abdomen and pelvis was performed following the standard protocol without IV contrast.  COMPARISON:  CT 01/03/2012  FINDINGS: Visualization of the lower thorax demonstrates no consolidative or nodular pulmonary opacities. Normal heart size.  Lack of intravenous contrast material limits evaluation of the solid organ parenchyma.  The non contrast-enhanced liver and gallbladder are unremarkable. Unchanged probable embolization clips within the inferior aspect of the spleen. Pancreas is unremarkable. Normal bilateral adrenal glands. Multiple nonobstructing stones are demonstrated within the right  kidney, the largest of which measures 6 mm within the superior pole. There is no hydronephrosis. No ureteral dilatation.  Normal caliber abdominal aorta. No retroperitoneal lymphadenopathy. Urinary bladder is unremarkable. Dystrophic calcification within the prostate.  Colon is decompressed. No abnormal bowel wall thickening or evidence for bowel obstruction. No free fluid or free intraperitoneal air. Normal appendix.  No aggressive or acute appearing osseous lesions. Persistent degenerative changes involving L1 vertebral body.  IMPRESSION: Multiple nonobstructing stones within the right kidney. No hydronephrosis. No ureterolithiasis   Electronically Signed   By: Annia Belt M.D.   On: 11/18/2013 19:40   Dg Abd 1 View  11/18/2013   CLINICAL DATA:  Right sided abdominal pain.  History kidney stones.  EXAM: ABDOMEN - 1 VIEW  COMPARISON:  CT, 01/03/2012.  FINDINGS: 2 densities project over the right renal shadow, the larger over the mid to upper pole measuring 5 mm in size. No left renal stones are evident. There is  no evidence of a ureteral stone.  Normal bowel gas pattern. Soft tissues are otherwise unremarkable. No significant bony abnormality.  IMPRESSION: Two stones project within the right kidney. No evidence of a ureteral stone. No acute findings.   Electronically Signed   By: Amie Portlandavid  Ormond M.D.   On: 11/18/2013 18:58     EKG Interpretation None      MDM   Final diagnoses:  Abdominal pain  Right ureteral stone  Flank pain   Patient with flank pain. History of ureteral stone, but none visible on CT. No reason for pain. Is on chronic pain medicines. Will discharge home to follow-up as needed.  I personally performed the services described in this documentation, which was scribed in my presence. The recorded information has been reviewed and is accurate.       Juliet RudeNathan R. Rubin PayorPickering, MD 11/19/13 [id number]

## 2013-11-18 NOTE — ED Notes (Signed)
Pt with RLQ abd pain that has been getting worse, pt states difficulty urinating at times

## 2013-11-18 NOTE — ED Notes (Signed)
Pt called out for pain medication; EDP made aware and no new orders given; pt informed

## 2013-11-18 NOTE — Discharge Instructions (Signed)

## 2014-03-24 ENCOUNTER — Emergency Department (HOSPITAL_COMMUNITY)
Admission: EM | Admit: 2014-03-24 | Discharge: 2014-03-24 | Disposition: A | Discharge status: Home / Self Care | Payer: Self-pay | Attending: Emergency Medicine | Admitting: Emergency Medicine

## 2014-03-24 ENCOUNTER — Encounter (HOSPITAL_COMMUNITY): Payer: Self-pay | Admitting: Emergency Medicine

## 2014-03-24 DIAGNOSIS — B182 Chronic viral hepatitis C: Secondary | ICD-10-CM | POA: Insufficient documentation

## 2014-03-24 DIAGNOSIS — Z79899 Other long term (current) drug therapy: Secondary | ICD-10-CM | POA: Insufficient documentation

## 2014-03-24 DIAGNOSIS — Z8739 Personal history of other diseases of the musculoskeletal system and connective tissue: Secondary | ICD-10-CM | POA: Insufficient documentation

## 2014-03-24 DIAGNOSIS — R319 Hematuria, unspecified: Secondary | ICD-10-CM | POA: Insufficient documentation

## 2014-03-24 DIAGNOSIS — Z720 Tobacco use: Secondary | ICD-10-CM | POA: Insufficient documentation

## 2014-03-24 DIAGNOSIS — R109 Unspecified abdominal pain: Secondary | ICD-10-CM

## 2014-03-24 DIAGNOSIS — Z792 Long term (current) use of antibiotics: Secondary | ICD-10-CM | POA: Insufficient documentation

## 2014-03-24 DIAGNOSIS — Z8719 Personal history of other diseases of the digestive system: Secondary | ICD-10-CM | POA: Insufficient documentation

## 2014-03-24 DIAGNOSIS — F319 Bipolar disorder, unspecified: Secondary | ICD-10-CM | POA: Insufficient documentation

## 2014-03-24 DIAGNOSIS — G8929 Other chronic pain: Secondary | ICD-10-CM | POA: Insufficient documentation

## 2014-03-24 DIAGNOSIS — Z791 Long term (current) use of non-steroidal anti-inflammatories (NSAID): Secondary | ICD-10-CM | POA: Insufficient documentation

## 2014-03-24 DIAGNOSIS — F909 Attention-deficit hyperactivity disorder, unspecified type: Secondary | ICD-10-CM | POA: Insufficient documentation

## 2014-03-24 LAB — URINE MICROSCOPIC-ADD ON

## 2014-03-24 LAB — URINALYSIS, ROUTINE W REFLEX MICROSCOPIC
BILIRUBIN URINE: NEGATIVE
Glucose, UA: NEGATIVE mg/dL
KETONES UR: NEGATIVE mg/dL
Leukocytes, UA: NEGATIVE
Nitrite: NEGATIVE
PROTEIN: 30 mg/dL — AB
Specific Gravity, Urine: 1.025 (ref 1.005–1.030)
UROBILINOGEN UA: 0.2 mg/dL (ref 0.0–1.0)
pH: 6 (ref 5.0–8.0)

## 2014-03-24 MED ORDER — OXYCODONE HCL 5 MG PO TABS
5.0000 mg | ORAL_TABLET | Freq: Once | ORAL | Status: AC
  Administered 2014-03-24: 5 mg via ORAL
  Filled 2014-03-24: qty 1

## 2014-03-24 MED ORDER — NAPROXEN 375 MG PO TABS: 375.0000 mg | ORAL_TABLET | Freq: Two times a day (BID) | ORAL | Status: DC

## 2014-03-24 MED ORDER — KETOROLAC TROMETHAMINE 60 MG/2ML IM SOLN
60.0000 mg | Freq: Once | INTRAMUSCULAR | Status: AC
  Administered 2014-03-24: 60 mg via INTRAMUSCULAR
  Filled 2014-03-24: qty 2

## 2014-03-24 NOTE — ED Provider Notes (Signed)
CSN: [id number]     Arrival date & time 03/24/14  1321 History  This chart was scribed for Blane OharaJoshua Brandalynn Ofallon, MD by Ronney LionSuzanne Le, ED Scribe. This patient was seen in Triage and the patient's care was started at 2:18 PM.    Chief Complaint  Patient presents with  . Flank Pain   Patient is a 39 y.o. male presenting with flank pain. The history is provided by the patient. No language interpreter was used.  Flank Pain This is a new problem. The current episode started more than 2 days ago. The problem occurs constantly. The problem has been gradually worsening. Pertinent negatives include no abdominal pain. Nothing aggravates the symptoms. Nothing relieves the symptoms. He has tried nothing for the symptoms.    HPI Comments: Joshua Thomas is a 39 y.o. male with a history of hypertensionwho presents to the Emergency Department complaining of constant, moderate right flank pain that began 4 days ago. He complains of associated hematuria and presents a jar of his red-colored urine from yesterday. Patient states that 3 weeks ago, he passed a small kidney stone but has 3 stones left in his kidney to pass.Patient [place]ist in [place]. He denies fever, chills, vomiting, or diarrhea. He has known allergies to Morphine and cannot take acetaminophen due to a liver condition. Patient is a smoker. Patient denies any recent weight changes. He denies any recent EtOH consumption.   Past Medical History  Diagnosis Date  . Depression   . Bipolar 1 disorder   . ADHD (attention deficit hyperactivity disorder)   . Hepatitis C   . Chronic pain     chronic l leg pain  . Sciatica   . Degenerative disc disease, lumbar   . GERD (gastroesophageal reflux disease)   . History of stomach ulcers   . DDD (degenerative disc disease), lumbar   . Hepatitis C    Past Surgical History  Procedure Laterality Date  . Fracture surgery    . Tibia im nail insertion  01/27/2012    Procedure: INTRAMEDULLARY (IM) NAIL  TIBIAL;  Surgeon: Kathryne Hitchhristopher Y Blackman, MD;  Location: [facility];  Service: Orthopedics;  Laterality: Left;  Removal of IM Rod and Screws Left Tibia with Exchange Nail, Allograft Bone Graft Left Tibia  . Hardware removal  01/27/2012    Procedure: HARDWARE REMOVAL;  Surgeon: Kathryne Hitchhristopher Y Blackman, MD;  Location: [facility];  Service: Orthopedics;  Laterality: Left;   Family History  Problem Relation Age of Onset  . Diabetes Mother    History  Substance Use Topics  . Smoking status: Current Every Day Smoker -- 1.00 packs/day for 24 years    Types: Cigarettes  . Smokeless tobacco: Former NeurosurgeonUser    Quit date: 08/08/2006  . Alcohol Use: Yes     Comment: occasionally liquor    Review of Systems  Gastrointestinal: Negative for abdominal pain.  Genitourinary: Positive for hematuria and flank pain.  All other systems reviewed and are negative.     Allergies  Acetaminophen; Morphine and related; Buprenorphine hcl; and Meperidine  Home Medications   Prior to Admission medications   Medication Sig Start Date End Date Taking? Authorizing Provider  clonazePAM (KLONOPIN) 1 MG tablet Take 1 mg by mouth 3 (three) times daily as needed for anxiety.    Yes Historical Provider, MD  Oxycodone HCl 10 MG TABS Take 10 mg by mouth 4 (four) times daily.   Yes Historical Provider, MD  QUEtiapine (SEROQUEL) 100 MG tablet Take  100 mg by mouth at bedtime.   Yes Historical Provider, MD  amoxicillin (AMOXIL) 500 MG capsule Take 1 capsule (500 mg total) by mouth 3 (three) times daily. Patient not taking: Reported on 11/18/2013 07/22/13   Tammi Triplett, PA-C  clindamycin (CLEOCIN) 300 MG capsule Take 1 capsule (300 mg total) by mouth 3 (three) times daily. Patient not taking: Reported on 11/18/2013 07/25/13   Kristen N Ward, DO  naproxen (NAPROSYN) 375 MG tablet Take 1 tablet (375 mg total) by mouth 2 (two) times daily. 03/24/14   Blane Ohara, MD  oxyCODONE (ROXICODONE) 5 MG immediate release tablet Take 1 tablet (5  mg total) by mouth every 4 (four) hours as needed for severe pain. Patient not taking: Reported on 11/18/2013 07/25/13   Kristen N Ward, DO   BP 138/92 mmHg  Pulse 120  Temp(Src) 99 F (37.2 C) (Oral)  Resp 20  Ht  (1.6 m)  Wt 165 lb (74.844 kg)  BMI 29.24 kg/m2  SpO2 99% Physical Exam  Constitutional: He is oriented to person, place, and time. He appears well-developed and well-nourished. No distress.  HENT:  Head: Normocephalic and atraumatic.  Eyes: Conjunctivae and EOM are normal.  Neck: Neck supple. No tracheal deviation present.  Cardiovascular: Normal rate.   Pulmonary/Chest: Effort normal. No respiratory distress.  Abdominal: Soft. There is no tenderness.  No focal tenderness.  Genitourinary:  No reproducible tenderness in the flanks.  Musculoskeletal: Normal range of motion.  No midline back tenderness.  Neurological: He is alert and oriented to person, place, and time.  Skin: Skin is warm and dry.  Psychiatric: He has a normal mood and affect. His behavior is normal.  Nursing note and vitals reviewed.   ED Course  Procedures (including critical care time) Emergency Focused Ultrasound Exam Limited retroperitoneal ultrasound of kidneys  Performed and interpreted by Dr. Jodi Mourning Indication: flank pain Focused abdominal ultrasound with both kidneys imaged in transverse and longitudinal planes in real-time. Interpretation: no hydronephrosis visualized.   Images archived electronically  DIAGNOSTIC STUDIES: Oxygen Saturation is 99% on room air, normal by my interpretation.    COORDINATION OF CARE: 2:23 PM - Discussed treatment plan with pt at bedside which includes pain medication, UA, and urologic follow-up, and pt agreed to plan.   Labs Review Labs Reviewed  URINALYSIS, ROUTINE W REFLEX MICROSCOPIC - Abnormal; Notable for the following:    Hgb urine dipstick LARGE (*)    Protein, ur 30 (*)    All other components within normal limits  URINE MICROSCOPIC-ADD  ON   Imaging Review No results found.   EKG Interpretation None      MDM   Final diagnoses:  Acute right flank pain  Chronic hepatitis C without hepatic coma  Hematuria   I personally performed the services described in this documentation, which was scribed in my presence. The recorded information has been reviewed and is accurate.  Patient presents with recurrent flank pain and hematuria. Patient has been evaluated multiple times for this and recent CT scan results reviewed with kidney stones in the kidney however no kidney stones in the ureter. Patient says he has not followed up with urology as directed. Patient denies any recent alcohol use. Patient has benign abdominal exam and mild flank pain. No vomiting. Plan for Toradol, oral pain meds and outpatient follow up with urology stressed. Discussed differential diagnosis hematuria and patient understands importance of following up however due to financial reasons he is having difficulty.  Patient requesting narcotics  for home, patient is already on oxycodone regularly and with multiple CT scans not showing a stone patient not following up do not feel increased narcotics are indicated. Naproxen to help with his pain.  Heart rate tachycardic secondarily to pain, mild dehydration and anxiety related to frustration with not follow-up with urology. No cp or sob. Results and differential diagnosis were discussed with the patient/parent/guardian. Close follow up outpatient was discussed, comfortable with the plan.   Medications  ketorolac (TORADOL) injection 60 mg (60 mg Intramuscular Given 03/24/14 1451)  oxyCODONE (Oxy IR/ROXICODONE) immediate release tablet 5 mg (5 mg Oral Given 03/24/14 1451)    Filed Vitals:   03/24/14 1331  BP: 138/92  Pulse: 120  Temp: 99 F (37.2 C)  TempSrc: Oral  Resp: 20  Height:  (1.6 m)  Weight: 165 lb (74.844 kg)  SpO2: 99%    Final diagnoses:  Acute right flank pain  Chronic hepatitis C  without hepatic coma  Hematuria      Blane Ohara, MD 03/24/14 1534

## 2014-03-24 NOTE — Discharge Instructions (Signed)
If you were given medicines take as directed.  If you are on coumadin or contraceptives realize their levels and effectiveness is altered by many different medicines.  If you have any reaction (rash, tongues swelling, other) to the medicines stop taking and see a physician.   Take naproxen for pain.   Please follow up as directed and return to the ER or see a physician for new or worsening symptoms.  Thank you.

## 2014-03-24 NOTE — ED Notes (Signed)
Pt c/o right flank pain that started Wednesday, blood in urine,

## 2014-03-24 NOTE — ED Notes (Signed)
Pt reports R flank pain with hematuria x 1 week.

## 2014-03-24 NOTE — ED Notes (Signed)
Pt did not receive discharge instructions prior to leaving,

## 2014-03-24 NOTE — ED Notes (Signed)
Went into pt's room to update vital signs, gown laying on bed, no patient in room or restroom

## 2014-08-24 ENCOUNTER — Encounter (HOSPITAL_COMMUNITY): Payer: Self-pay | Admitting: *Deleted

## 2014-08-24 ENCOUNTER — Emergency Department (HOSPITAL_COMMUNITY)
Admission: EM | Admit: 2014-08-24 | Discharge: 2014-08-24 | Disposition: A | Discharge status: Home / Self Care | Payer: Self-pay | Attending: Emergency Medicine | Admitting: Emergency Medicine

## 2014-08-24 ENCOUNTER — Emergency Department (HOSPITAL_COMMUNITY): Payer: Self-pay

## 2014-08-24 DIAGNOSIS — G8929 Other chronic pain: Secondary | ICD-10-CM | POA: Insufficient documentation

## 2014-08-24 DIAGNOSIS — Z8739 Personal history of other diseases of the musculoskeletal system and connective tissue: Secondary | ICD-10-CM | POA: Insufficient documentation

## 2014-08-24 DIAGNOSIS — T07XXXA Unspecified multiple injuries, initial encounter: Secondary | ICD-10-CM

## 2014-08-24 DIAGNOSIS — Y9389 Activity, other specified: Secondary | ICD-10-CM | POA: Insufficient documentation

## 2014-08-24 DIAGNOSIS — Z791 Long term (current) use of non-steroidal anti-inflammatories (NSAID): Secondary | ICD-10-CM | POA: Insufficient documentation

## 2014-08-24 DIAGNOSIS — F319 Bipolar disorder, unspecified: Secondary | ICD-10-CM | POA: Insufficient documentation

## 2014-08-24 DIAGNOSIS — Z720 Tobacco use: Secondary | ICD-10-CM | POA: Insufficient documentation

## 2014-08-24 DIAGNOSIS — Z79899 Other long term (current) drug therapy: Secondary | ICD-10-CM | POA: Insufficient documentation

## 2014-08-24 DIAGNOSIS — W1789XA Other fall from one level to another, initial encounter: Secondary | ICD-10-CM | POA: Insufficient documentation

## 2014-08-24 DIAGNOSIS — Y998 Other external cause status: Secondary | ICD-10-CM | POA: Insufficient documentation

## 2014-08-24 DIAGNOSIS — S3992XA Unspecified injury of lower back, initial encounter: Secondary | ICD-10-CM | POA: Insufficient documentation

## 2014-08-24 DIAGNOSIS — T148 Other injury of unspecified body region: Secondary | ICD-10-CM | POA: Insufficient documentation

## 2014-08-24 DIAGNOSIS — Z8619 Personal history of other infectious and parasitic diseases: Secondary | ICD-10-CM | POA: Insufficient documentation

## 2014-08-24 DIAGNOSIS — Z8719 Personal history of other diseases of the digestive system: Secondary | ICD-10-CM | POA: Insufficient documentation

## 2014-08-24 DIAGNOSIS — S99921A Unspecified injury of right foot, initial encounter: Secondary | ICD-10-CM | POA: Insufficient documentation

## 2014-08-24 DIAGNOSIS — Y9289 Other specified places as the place of occurrence of the external cause: Secondary | ICD-10-CM | POA: Insufficient documentation

## 2014-08-24 LAB — BASIC METABOLIC PANEL
Anion gap: 7 (ref 5–15)
BUN: 19 mg/dL (ref 6–20)
CO2: 27 mmol/L (ref 22–32)
Calcium: 9 mg/dL (ref 8.9–10.3)
Chloride: 104 mmol/L (ref 101–111)
Creatinine, Ser: 0.91 mg/dL (ref 0.61–1.24)
GFR calc non Af Amer: >60 mL/min (ref >60)
Glucose, Bld: 122 mg/dL — ABNORMAL HIGH (ref 65–99)
Potassium: 3.5 mmol/L (ref 3.5–5.1)
Sodium: 138 mmol/L (ref 135–145)

## 2014-08-24 LAB — CK: CK TOTAL: 99 U/L (ref 49–397)

## 2014-08-24 MED ORDER — OXYCODONE HCL 5 MG PO TABS
10.0000 mg | ORAL_TABLET | Freq: Once | ORAL | Status: AC
  Administered 2014-08-24: 10 mg via ORAL
  Filled 2014-08-24: qty 2
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
## 2014-08-24 NOTE — Discharge Instructions (Signed)
Contusion Use your crutches as needed. Take your oxycodone as directed. Follow-up with Dr.Dondiego if you need refills for pain medications or if you have trouble walking or continue to need crutches by next week. A contusion is a deep bruise. Contusions happen when an injury causes bleeding under the skin. Signs of bruising include pain, puffiness (swelling), and discolored skin. The contusion may turn blue, purple, or yellow. HOME CARE   Put ice on the injured area.  Put ice in a plastic bag.  Place a towel between your skin and the bag.  Leave the ice on for 15-20 minutes, 03-04 times a day.  Only take medicine as told by your doctor.  Rest the injured area.  If possible, raise (elevate) the injured area to lessen puffiness. GET HELP RIGHT AWAY IF:   You have more bruising or puffiness.  You have pain that is getting worse.  Your puffiness or pain is not helped by medicine. MAKE SURE YOU:   Understand these instructions.  Will watch your condition.  Will get help right away if you are not doing well or get worse. Document Released: 06/16/2007 Document Revised: 03/22/2011 Document Reviewed: 11/02/2010 [facility] Patient Information 2015 [place], [place]. This information is not intended to replace advice given to you by your health care provider. Make sure you discuss any questions you have with your health care provider.

## 2015-02-26 ENCOUNTER — Encounter (HOSPITAL_COMMUNITY): Payer: Self-pay | Admitting: Emergency Medicine

## 2015-02-26 ENCOUNTER — Emergency Department (HOSPITAL_COMMUNITY)
Admission: EM | Admit: 2015-02-26 | Discharge: 2015-02-26 | Disposition: A | Discharge status: Home / Self Care | Payer: Self-pay | Attending: Emergency Medicine | Admitting: Emergency Medicine

## 2015-02-26 ENCOUNTER — Emergency Department (HOSPITAL_COMMUNITY): Payer: No Typology Code available for payment source

## 2015-02-26 DIAGNOSIS — M5430 Sciatica, unspecified side: Secondary | ICD-10-CM | POA: Insufficient documentation

## 2015-02-26 DIAGNOSIS — Z8719 Personal history of other diseases of the digestive system: Secondary | ICD-10-CM | POA: Insufficient documentation

## 2015-02-26 DIAGNOSIS — Z8619 Personal history of other infectious and parasitic diseases: Secondary | ICD-10-CM | POA: Insufficient documentation

## 2015-02-26 DIAGNOSIS — Z8659 Personal history of other mental and behavioral disorders: Secondary | ICD-10-CM | POA: Insufficient documentation

## 2015-02-26 DIAGNOSIS — G8929 Other chronic pain: Secondary | ICD-10-CM | POA: Insufficient documentation

## 2015-02-26 DIAGNOSIS — L0201 Cutaneous abscess of face: Secondary | ICD-10-CM | POA: Insufficient documentation

## 2015-02-26 DIAGNOSIS — F17210 Nicotine dependence, cigarettes, uncomplicated: Secondary | ICD-10-CM | POA: Insufficient documentation

## 2015-02-26 DIAGNOSIS — R079 Chest pain, unspecified: Secondary | ICD-10-CM | POA: Insufficient documentation

## 2015-02-26 LAB — BASIC METABOLIC PANEL
Anion gap: 7 (ref 5–15)
BUN: 16 mg/dL (ref 6–20)
CALCIUM: 8.7 mg/dL — AB (ref 8.9–10.3)
CO2: 25 mmol/L (ref 22–32)
Chloride: 104 mmol/L (ref 101–111)
Creatinine, Ser: 0.89 mg/dL (ref 0.61–1.24)
GFR calc Af Amer: >60 mL/min (ref >60)
Glucose, Bld: 121 mg/dL — ABNORMAL HIGH (ref 65–99)
Potassium: 4 mmol/L (ref 3.5–5.1)
Sodium: 136 mmol/L (ref 135–145)

## 2015-02-26 LAB — CBC
HEMATOCRIT: 43 % (ref 39.0–52.0)
Hemoglobin: 13.5 g/dL (ref 13.0–17.0)
MCH: 30.7 pg (ref 26.0–34.0)
MCHC: 31.4 g/dL (ref 30.0–36.0)
MCV: 97.7 fL (ref 78.0–100.0)
PLATELETS: 187 10*3/uL (ref 150–400)
RBC: 4.4 MIL/uL (ref 4.22–5.81)
RDW: 13.8 % (ref 11.5–15.5)
WBC: 5.3 10*3/uL (ref 4.0–10.5)

## 2015-02-26 LAB — I-STAT TROPONIN, ED: TROPONIN I, POC: 0 ng/mL (ref 0.00–0.08)

## 2015-02-26 MED ORDER — IBUPROFEN 800 MG PO TABS
800.0000 mg | ORAL_TABLET | Freq: Once | ORAL | Status: AC
  Administered 2015-02-26: 800 mg via ORAL
  Filled 2015-02-26: qty 1

## 2015-02-26 MED ORDER — SULFAMETHOXAZOLE-TRIMETHOPRIM 800-160 MG PO TABS
1.0000 | ORAL_TABLET | Freq: Once | ORAL | Status: AC
  Administered 2015-02-26: 1 via ORAL
  Filled 2015-02-26: qty 1

## 2015-02-26 MED ORDER — LIDOCAINE-EPINEPHRINE 2 %-1:100000 IJ SOLN
30.0000 mL | Freq: Once | INTRAMUSCULAR | Status: DC
  Filled 2015-02-26: qty 2

## 2015-02-26 MED ORDER — SULFAMETHOXAZOLE-TRIMETHOPRIM 800-160 MG PO TABS: 1.0000 | ORAL_TABLET | Freq: Two times a day (BID) | ORAL | Status: AC

## 2015-02-26 MED ORDER — FENTANYL CITRATE (PF) 100 MCG/2ML IJ SOLN
50.0000 ug | Freq: Once | INTRAMUSCULAR | Status: AC
  Administered 2015-02-26: 50 ug via NASAL
  Filled 2015-02-26: qty 2

## 2015-02-26 MED ORDER — IBUPROFEN 600 MG PO TABS: 600.0000 mg | ORAL_TABLET | Freq: Three times a day (TID) | ORAL | Status: DC | PRN

## 2015-02-26 MED ORDER — OXYCODONE HCL 5 MG PO TABS
5.0000 mg | ORAL_TABLET | Freq: Once | ORAL | Status: AC
  Administered 2015-02-26: 5 mg via ORAL
  Filled 2015-02-26: qty 1

## 2015-02-26 NOTE — ED Provider Notes (Signed)
CSN: [id number]     Arrival date & time 02/26/15  1743 History   First MD Initiated Contact with Patient 02/26/15 2040     Chief Complaint  Patient presents with  . Facial Pain  . Chest Pain      HPI Patient presents to the emergency department with complaints of increasing pain swelling and abscess to his forehead.  He states his been worsening over the past for 5 days.  He did squeeze and have some puslike drainage coming out.  He reports no fever or chills.  He reports some developing surrounding redness and ongoing swelling.  His pain is severe in severity.  No other complaints at this time   Past Medical History  Diagnosis Date  . Depression   . Bipolar 1 disorder (HCC)   . ADHD (attention deficit hyperactivity disorder)   . Hepatitis C   . Chronic pain     chronic l leg pain  . Sciatica   . Degenerative disc disease, lumbar   . GERD (gastroesophageal reflux disease)   . History of stomach ulcers   . DDD (degenerative disc disease), lumbar   . Hepatitis C    Past Surgical History  Procedure Laterality Date  . Fracture surgery    . Tibia im nail insertion  01/27/2012    Procedure: INTRAMEDULLARY (IM) NAIL TIBIAL;  Surgeon: Kathryne Hitch, MD;  Location: [facility];  Service: Orthopedics;  Laterality: Left;  Removal of IM Rod and Screws Left Tibia with Exchange Nail, Allograft Bone Graft Left Tibia  . Hardware removal  01/27/2012    Procedure: HARDWARE REMOVAL;  Surgeon: Kathryne Hitch, MD;  Location: [facility];  Service: Orthopedics;  Laterality: Left;   Family History  Problem Relation Age of Onset  . Diabetes Mother    Social History  Substance Use Topics  . Smoking status: Current Every Day Smoker -- 1.00 packs/day for 24 years    Types: Cigarettes  . Smokeless tobacco: Former Neurosurgeon    Quit date: 08/08/2006  . Alcohol Use: Yes     Comment: occasionally liquor    Review of Systems  All other systems reviewed and are negative.     Allergies   Acetaminophen; Morphine and related; Buprenorphine hcl; and Meperidine  Home Medications   Prior to Admission medications   Medication Sig Start Date End Date Taking? Authorizing Provider  Aspirin-Salicylamide-Caffeine (BC HEADACHE POWDER PO) Take 1 packet by mouth daily as needed (pain).   Yes Historical Provider, MD  ibuprofen (ADVIL,MOTRIN) 600 MG tablet Take 1 tablet (600 mg total) by mouth every 8 (eight) hours as needed. 02/26/15   Azalia Bilis, MD  sulfamethoxazole-trimethoprim (BACTRIM DS,SEPTRA DS) 800-160 MG tablet Take 1 tablet by mouth 2 (two) times daily. 02/26/15 03/05/15  Azalia Bilis, MD   BP 114/89 mmHg  Pulse 91  Temp(Src) 98.3 F (36.8 C) (Oral)  Resp 18  SpO2 100% Physical Exam  Constitutional: He is oriented to person, place, and time. He appears well-developed and well-nourished.  HENT:  Head: Normocephalic.  Abscess with a small amount of surrounding erythema and some drainage noted between his eyebrows.  I started movements are intact  Eyes: EOM are normal.  Neck: Normal range of motion.  Cardiovascular: Normal rate, regular rhythm, normal heart sounds and intact distal pulses.   Pulmonary/Chest: Effort normal and breath sounds normal. No respiratory distress.  Abdominal: Soft. He exhibits no distension. There is no tenderness.  Musculoskeletal: Normal range of motion.  Neurological: He is  alert and oriented to person, place, and time.  Skin: Skin is warm and dry.  Psychiatric: He has a normal mood and affect. Judgment normal.  Nursing note and vitals reviewed.   ED Course  Procedures (including critical care time)  INCISION AND DRAINAGE Performed by: Lyanne Co Consent: Verbal consent obtained. Risks and benefits: risks, benefits and alternatives were discussed Time out performed prior to procedure Type: abscess Body area: Forehead Anesthesia: local infiltration Incision was made with a scalpel. Local anesthetic: lidocaine 2 % with  epinephrine Anesthetic total: 5 ml Complexity: complex Blunt dissection to break up loculations Drainage: purulent Drainage amount: Moderate  Packing material: None none  Patient tolerance: Patient tolerated the procedure well with no immediate complications.     Labs Review Labs Reviewed  BASIC METABOLIC PANEL - Abnormal; Notable for the following:    Glucose, Bld 121 (*)    Calcium 8.7 (*)    All other components within normal limits  CBC  I-STAT TROPOININ, ED    Imaging Review Dg Chest 2 View  02/26/2015  CLINICAL DATA:  40 year old with facial infection. Eye swelling. Chest pain. EXAM: CHEST  2 VIEW COMPARISON:  Radiographs 10/25/2012 and 10/20/2012. FINDINGS: The heart size and mediastinal contours are stable. The lungs appear mildly hyperinflated but clear. There is no pleural effusion or pneumothorax. No acute osseous findings are seen. There is a small curvilinear metallic foreign body projecting over the left upper quadrant of the abdomen which is unchanged, probably an embolization coil. IMPRESSION: No active cardiopulmonary process. Electronically Signed   By: Carey Bullocks M.D.   On: 02/26/2015 18:44   I have personally reviewed and evaluated these images and lab results as part of my medical decision-making.   EKG Interpretation None      MDM   Final diagnoses:  Facial abscess    Discharge home in good condition.  Abscess status post incision and drainage.  Home on Bactrim and ibuprofen.  Infection warnings given    Azalia Bilis, MD 02/26/15 2312

## 2015-02-26 NOTE — ED Notes (Signed)
Pt. On cardiac monitoring. 

## 2015-02-26 NOTE — Discharge Instructions (Signed)

## 2015-02-26 NOTE — ED Notes (Signed)
Pt was complaining of increasing pain while waiting.  Pt brought back to triage room and given 50 more mcg fentanyl IN.  Reiterated to stay to see the doctor and not to drive.  Pt agreeable to this plan.

## 2015-02-26 NOTE — ED Notes (Signed)
Pt states he has staph infection on his face between his eyebrows.  Now eyelids are swollen.  States that his facial pain started on Sunday and his chest pain started yesterday.

## 2015-05-21 ENCOUNTER — Emergency Department (HOSPITAL_COMMUNITY): Payer: Self-pay

## 2015-05-21 ENCOUNTER — Encounter (HOSPITAL_COMMUNITY): Payer: Self-pay | Admitting: Emergency Medicine

## 2015-05-21 ENCOUNTER — Inpatient Hospital Stay (HOSPITAL_COMMUNITY)
Admission: AD | Admit: 2015-05-21 | Discharge: 2015-05-23 | DRG: 885 | Disposition: A | Discharge status: Home / Self Care | Payer: No Typology Code available for payment source | Source: Intra-hospital | Attending: Psychiatry | Admitting: Psychiatry

## 2015-05-21 ENCOUNTER — Emergency Department (HOSPITAL_COMMUNITY)
Admission: EM | Admit: 2015-05-21 | Discharge: 2015-05-21 | Disposition: A | Discharge status: Other Acute Inpatient Hospital | Payer: Self-pay | Attending: Emergency Medicine | Admitting: Emergency Medicine

## 2015-05-21 ENCOUNTER — Encounter (HOSPITAL_COMMUNITY): Payer: Self-pay

## 2015-05-21 DIAGNOSIS — F17210 Nicotine dependence, cigarettes, uncomplicated: Secondary | ICD-10-CM | POA: Diagnosis present

## 2015-05-21 DIAGNOSIS — F1310 Sedative, hypnotic or anxiolytic abuse, uncomplicated: Secondary | ICD-10-CM | POA: Diagnosis present

## 2015-05-21 DIAGNOSIS — Z915 Personal history of self-harm: Secondary | ICD-10-CM | POA: Diagnosis not present

## 2015-05-21 DIAGNOSIS — N390 Urinary tract infection, site not specified: Secondary | ICD-10-CM | POA: Insufficient documentation

## 2015-05-21 DIAGNOSIS — F3160 Bipolar disorder, current episode mixed, unspecified: Secondary | ICD-10-CM | POA: Diagnosis present

## 2015-05-21 DIAGNOSIS — F1120 Opioid dependence, uncomplicated: Secondary | ICD-10-CM | POA: Diagnosis present

## 2015-05-21 DIAGNOSIS — F419 Anxiety disorder, unspecified: Secondary | ICD-10-CM | POA: Diagnosis present

## 2015-05-21 DIAGNOSIS — F32A Depression, unspecified: Secondary | ICD-10-CM

## 2015-05-21 DIAGNOSIS — F1420 Cocaine dependence, uncomplicated: Secondary | ICD-10-CM | POA: Diagnosis present

## 2015-05-21 DIAGNOSIS — G8929 Other chronic pain: Secondary | ICD-10-CM | POA: Diagnosis present

## 2015-05-21 DIAGNOSIS — F1910 Other psychoactive substance abuse, uncomplicated: Secondary | ICD-10-CM | POA: Diagnosis present

## 2015-05-21 DIAGNOSIS — F1424 Cocaine dependence with cocaine-induced mood disorder: Secondary | ICD-10-CM | POA: Diagnosis present

## 2015-05-21 DIAGNOSIS — F19929 Other psychoactive substance use, unspecified with intoxication, unspecified: Secondary | ICD-10-CM | POA: Clinically undetermined

## 2015-05-21 DIAGNOSIS — F1994 Other psychoactive substance use, unspecified with psychoactive substance-induced mood disorder: Secondary | ICD-10-CM | POA: Diagnosis not present

## 2015-05-21 DIAGNOSIS — F1311 Sedative, hypnotic or anxiolytic abuse, in remission: Secondary | ICD-10-CM | POA: Clinically undetermined

## 2015-05-21 DIAGNOSIS — R45851 Suicidal ideations: Secondary | ICD-10-CM | POA: Insufficient documentation

## 2015-05-21 DIAGNOSIS — F329 Major depressive disorder, single episode, unspecified: Secondary | ICD-10-CM | POA: Insufficient documentation

## 2015-05-21 DIAGNOSIS — F1410 Cocaine abuse, uncomplicated: Secondary | ICD-10-CM | POA: Insufficient documentation

## 2015-05-21 DIAGNOSIS — N200 Calculus of kidney: Secondary | ICD-10-CM | POA: Insufficient documentation

## 2015-05-21 LAB — COMPREHENSIVE METABOLIC PANEL
ALBUMIN: 4 g/dL (ref 3.5–5.0)
ALK PHOS: 48 U/L (ref 38–126)
ALT: 90 U/L — ABNORMAL HIGH (ref 17–63)
ANION GAP: 11 (ref 5–15)
AST: 46 U/L — ABNORMAL HIGH (ref 15–41)
BILIRUBIN TOTAL: 1.3 mg/dL — AB (ref 0.3–1.2)
BUN: 23 mg/dL — ABNORMAL HIGH (ref 6–20)
CALCIUM: 9.2 mg/dL (ref 8.9–10.3)
CO2: 22 mmol/L (ref 22–32)
Chloride: 104 mmol/L (ref 101–111)
Creatinine, Ser: 0.93 mg/dL (ref 0.61–1.24)
GFR calc non Af Amer: >60 mL/min (ref >60)
GLUCOSE: 124 mg/dL — AB (ref 65–99)
Potassium: 3.9 mmol/L (ref 3.5–5.1)
Sodium: 137 mmol/L (ref 135–145)
TOTAL PROTEIN: 7.8 g/dL (ref 6.5–8.1)

## 2015-05-21 LAB — URINALYSIS, ROUTINE W REFLEX MICROSCOPIC
Bilirubin Urine: NEGATIVE
Glucose, UA: NEGATIVE mg/dL
Ketones, ur: 40 mg/dL — AB
NITRITE: NEGATIVE
Protein, ur: 100 mg/dL — AB
SPECIFIC GRAVITY, URINE: 1.025 (ref 1.005–1.030)
pH: 6 (ref 5.0–8.0)

## 2015-05-21 LAB — CK: Total CK: 54 U/L (ref 49–397)

## 2015-05-21 LAB — RAPID URINE DRUG SCREEN, HOSP PERFORMED
AMPHETAMINES: NOT DETECTED
BENZODIAZEPINES: POSITIVE — AB
Barbiturates: NOT DETECTED
COCAINE: POSITIVE — AB
OPIATES: NOT DETECTED
Tetrahydrocannabinol: NOT DETECTED

## 2015-05-21 LAB — CBC
HCT: 45.6 % (ref 39.0–52.0)
HEMOGLOBIN: 15.1 g/dL (ref 13.0–17.0)
MCH: 31.5 pg (ref 26.0–34.0)
MCHC: 33.1 g/dL (ref 30.0–36.0)
MCV: 95.2 fL (ref 78.0–100.0)
Platelets: 173 10*3/uL (ref 150–400)
RBC: 4.79 MIL/uL (ref 4.22–5.81)
RDW: 14.9 % (ref 11.5–15.5)
WBC: 6.5 10*3/uL (ref 4.0–10.5)

## 2015-05-21 LAB — URINE MICROSCOPIC-ADD ON: SQUAMOUS EPITHELIAL / LPF: NONE SEEN

## 2015-05-21 LAB — PROTIME-INR
INR: 1.17 (ref 0.00–1.49)
Prothrombin Time: 15.1 seconds (ref 11.6–15.2)

## 2015-05-21 LAB — TROPONIN I

## 2015-05-21 LAB — APTT: APTT: 30 s (ref 24–37)

## 2015-05-21 MED ORDER — CEPHALEXIN 500 MG PO CAPS
500.0000 mg | ORAL_CAPSULE | Freq: Three times a day (TID) | ORAL | Status: DC
  Administered 2015-05-21 (×2): 500 mg via ORAL
  Filled 2015-05-21 (×2): qty 1

## 2015-05-21 MED ORDER — NITROGLYCERIN 2 % TD OINT
1.0000 [in_us] | TOPICAL_OINTMENT | Freq: Once | TRANSDERMAL | Status: AC
  Administered 2015-05-21: 1 [in_us] via TOPICAL
  Filled 2015-05-21: qty 1

## 2015-05-21 MED ORDER — METHOCARBAMOL 750 MG PO TABS
750.0000 mg | ORAL_TABLET | Freq: Four times a day (QID) | ORAL | Status: DC | PRN
  Administered 2015-05-21: 750 mg via ORAL
  Filled 2015-05-21: qty 1

## 2015-05-21 MED ORDER — DICYCLOMINE HCL 10 MG PO CAPS: 20.0000 mg | ORAL_CAPSULE | Freq: Four times a day (QID) | ORAL | Status: DC | PRN

## 2015-05-21 MED ORDER — METHOCARBAMOL 500 MG PO TABS: 1000.0000 mg | ORAL_TABLET | Freq: Four times a day (QID) | ORAL | Status: DC | PRN

## 2015-05-21 MED ORDER — ZOLPIDEM TARTRATE 5 MG PO TABS: 10.0000 mg | ORAL_TABLET | Freq: Every evening | ORAL | Status: DC | PRN

## 2015-05-21 MED ORDER — CLONAZEPAM 1 MG PO TABS
1.0000 mg | ORAL_TABLET | Freq: Two times a day (BID) | ORAL | Status: DC
  Administered 2015-05-21 – 2015-05-22 (×2): 1 mg via ORAL
  Filled 2015-05-21: qty 2
  Filled 2015-05-21: qty 1

## 2015-05-21 MED ORDER — ALUM & MAG HYDROXIDE-SIMETH 200-200-20 MG/5ML PO SUSP: 30.0000 mL | ORAL | Status: DC | PRN

## 2015-05-21 MED ORDER — IBUPROFEN 600 MG PO TABS
600.0000 mg | ORAL_TABLET | Freq: Three times a day (TID) | ORAL | Status: DC | PRN
  Administered 2015-05-22 – 2015-05-23 (×2): 600 mg via ORAL
  Filled 2015-05-21 (×2): qty 1

## 2015-05-21 MED ORDER — DICYCLOMINE HCL 20 MG PO TABS
20.0000 mg | ORAL_TABLET | Freq: Four times a day (QID) | ORAL | Status: DC | PRN
  Filled 2015-05-21: qty 1

## 2015-05-21 MED ORDER — CEPHALEXIN 500 MG PO CAPS
500.0000 mg | ORAL_CAPSULE | Freq: Three times a day (TID) | ORAL | Status: DC
  Administered 2015-05-21 – 2015-05-23 (×5): 500 mg via ORAL
  Filled 2015-05-21 (×3): qty 1
  Filled 2015-05-21: qty 2
  Filled 2015-05-21 (×8): qty 1

## 2015-05-21 MED ORDER — LORAZEPAM 1 MG PO TABS: 1.0000 mg | ORAL_TABLET | Freq: Three times a day (TID) | ORAL | Status: DC | PRN

## 2015-05-21 MED ORDER — HYDROXYZINE HCL 25 MG PO TABS: 50.0000 mg | ORAL_TABLET | Freq: Four times a day (QID) | ORAL | Status: DC | PRN

## 2015-05-21 MED ORDER — ONDANSETRON HCL 4 MG PO TABS: 4.0000 mg | ORAL_TABLET | Freq: Three times a day (TID) | ORAL | Status: DC | PRN

## 2015-05-21 MED ORDER — MAGNESIUM HYDROXIDE 400 MG/5ML PO SUSP: 30.0000 mL | Freq: Every day | ORAL | Status: DC | PRN

## 2015-05-21 MED ORDER — CYCLOBENZAPRINE HCL 10 MG PO TABS
10.0000 mg | ORAL_TABLET | Freq: Once | ORAL | Status: AC
  Administered 2015-05-21: 10 mg via ORAL
  Filled 2015-05-21: qty 1

## 2015-05-21 MED ORDER — NICOTINE 21 MG/24HR TD PT24
21.0000 mg | MEDICATED_PATCH | Freq: Every day | TRANSDERMAL | Status: DC
  Administered 2015-05-21: 21 mg via TRANSDERMAL
  Filled 2015-05-21: qty 1

## 2015-05-21 MED ORDER — LOPERAMIDE HCL 2 MG PO CAPS: 2.0000 mg | ORAL_CAPSULE | ORAL | Status: DC | PRN

## 2015-05-21 MED ORDER — CLONIDINE HCL 0.1 MG PO TABS: 0.1000 mg | ORAL_TABLET | Freq: Every day | ORAL | Status: DC

## 2015-05-21 MED ORDER — LOPERAMIDE HCL 2 MG PO CAPS
2.0000 mg | ORAL_CAPSULE | ORAL | Status: DC | PRN
Start: 2015-05-21 — End: 2015-05-21

## 2015-05-21 MED ORDER — CLONIDINE HCL 0.1 MG PO TABS
0.1000 mg | ORAL_TABLET | Freq: Four times a day (QID) | ORAL | Status: DC
  Administered 2015-05-21 (×3): 0.1 mg via ORAL
  Filled 2015-05-21 (×3): qty 1

## 2015-05-21 MED ORDER — SODIUM CHLORIDE 0.9 % IV SOLN
1000.0000 mL | INTRAVENOUS | Status: DC
  Administered 2015-05-21: 1000 mL via INTRAVENOUS

## 2015-05-21 MED ORDER — ASPIRIN 81 MG PO CHEW
324.0000 mg | CHEWABLE_TABLET | Freq: Once | ORAL | Status: AC
  Administered 2015-05-21: 324 mg via ORAL
  Filled 2015-05-21: qty 4

## 2015-05-21 MED ORDER — SODIUM CHLORIDE 0.9 % IV SOLN
1000.0000 mL | Freq: Once | INTRAVENOUS | Status: AC
  Administered 2015-05-21: 1000 mL via INTRAVENOUS

## 2015-05-21 MED ORDER — QUETIAPINE FUMARATE 200 MG PO TABS
200.0000 mg | ORAL_TABLET | Freq: Every day | ORAL | Status: DC
  Administered 2015-05-21: 200 mg via ORAL
  Filled 2015-05-21 (×4): qty 1

## 2015-05-21 MED ORDER — SODIUM CHLORIDE 0.9 % IV BOLUS (SEPSIS)
1000.0000 mL | Freq: Once | INTRAVENOUS | Status: AC
  Administered 2015-05-21: 1000 mL via INTRAVENOUS

## 2015-05-21 MED ORDER — KETOROLAC TROMETHAMINE 30 MG/ML IJ SOLN
30.0000 mg | Freq: Once | INTRAMUSCULAR | Status: AC
  Administered 2015-05-21: 30 mg via INTRAVENOUS
  Filled 2015-05-21: qty 1

## 2015-05-21 MED ORDER — HYDROXYZINE HCL 50 MG PO TABS
50.0000 mg | ORAL_TABLET | Freq: Four times a day (QID) | ORAL | Status: DC | PRN
  Administered 2015-05-21: 50 mg via ORAL
  Filled 2015-05-21: qty 1

## 2015-05-21 MED ORDER — NICOTINE 21 MG/24HR TD PT24
21.0000 mg | MEDICATED_PATCH | Freq: Every day | TRANSDERMAL | Status: DC
  Administered 2015-05-22 – 2015-05-23 (×2): 21 mg via TRANSDERMAL
  Filled 2015-05-21 (×5): qty 1

## 2015-05-21 MED ORDER — CLONIDINE HCL 0.1 MG PO TABS: 0.1000 mg | ORAL_TABLET | ORAL | Status: DC

## 2015-05-21 MED ORDER — IBUPROFEN 400 MG PO TABS: 600.0000 mg | ORAL_TABLET | Freq: Three times a day (TID) | ORAL | Status: DC | PRN

## 2015-05-21 MED ORDER — OXYCODONE HCL 5 MG PO TABS
10.0000 mg | ORAL_TABLET | Freq: Four times a day (QID) | ORAL | Status: DC | PRN
  Administered 2015-05-21 – 2015-05-22 (×3): 10 mg via ORAL
  Filled 2015-05-21 (×3): qty 2
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
## 2015-05-21 NOTE — ED Notes (Signed)
Pt given meal tray.
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
## 2015-05-21 NOTE — ED Provider Notes (Signed)
Psychiatry has evaluated pt: [facility]. Pt remains voluntary. Will transfer stable.   Samuel JesterKathleen Evolet Salminen, DO 05/21/15 1333

## 2015-05-21 NOTE — ED Notes (Signed)
Gave patient meal tray and drink as requested. 

## 2015-05-21 NOTE — ED Notes (Signed)
Patient receiving telepsych at this time. 
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
## 2015-05-21 NOTE — Progress Notes (Signed)
Patient ID: Joshua SleightLee W Thomas, male   DOB: 06/19/1975, 40 y.o.   MRN: [id number] Patient was admitted for observation due to increased depression, anxiety and poly substance abuse.  Patient reported using enough cocaine and stated that he didn't care if he lived or died when taking it.  Patient reported having stressors of a recent breakup with his girlfriend and no place to live.  Patient states that his stressors cause him to use drugs and then he has thoughts of suicide.  Patient is calm and cooperative without any agitation but has been restless and unable to sit still.   Patient denies and active plan to of suicide but remains ambivalent about living.  Patient is able to contract for safety and is negative for AVH at the time.

## 2015-05-21 NOTE — ED Notes (Signed)
Pt wand by security at this time.

## 2015-05-21 NOTE — ED Notes (Signed)
Pt undergoing TTS.  

## 2015-05-21 NOTE — ED Notes (Signed)
Pt states he has not urinated since "yesterday afternoon and it stank".

## 2015-05-21 NOTE — Progress Notes (Signed)
[facility] attending Dr. Lucianne MussKumar. Number for report is [phone].  Ilean SkillMeghan Ladaisha Portillo, MSW, LCSW Clinical Social Work, Disposition  05/21/2015 [phone]

## 2015-05-21 NOTE — ED Notes (Addendum)
Patient complaining of bilateral flank pain and dysuria starting yesterday. Also complaining of chest pain at triage. Patient brought to ER by [employer], per officer [facility] and told an employee that he was going to drink some coffee and then going to kill himself. Patient denies this statement. Denies SI/HI at triage. Patient states he was shooting up cocaine tonight at approximately 2000. Patient states he wants help for his drug problem. States "I'm done, I think I've done messed my body up and I need help."
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
## 2015-05-21 NOTE — [facility] Counselor (Addendum)
Spoke with pt. Performed brief counseling. Pt is first voluntary seeking help with S.A. Pt acknowledges to dangerous behaviors prior to admission with intent to overdose on substances. Pt drug of choice, Cocaine vi Intravenous injection. Pt denies hx. Of S.A. Rehab admittance. Pt is acknowledging problem with S.A. And possible underlying mental health conditions with family hx. Of mental health disorder w/ most recognized bipolar disorder, schizophrenia. Pt. currently seeking medication mngmt, but acknowledges that S.A. Must be addressed for medication mngmnt. To be effective. Pt request for rehab inpt services and detox.   This counseor obtained consent and sent referral to [facility], [facility], [facility], [facility], and [facility] [Fax confirm not yet received].   Other resources, pt. Counselor on dusty tonight contact [facility] for admission/ referral services.  Pt and counselor to follow up in the a.m. With agencies, and for nest steps/ plan for treatment with consult to provider/ NP on duty as well.   Joshua Thomas, LCAS-A, LPC-A, [facility]  Counselor 05/21/2015 6:49 PM

## 2015-05-21 NOTE — Tx Team (Signed)
Initial Interdisciplinary Treatment Plan   PATIENT STRESSORS: Marital or family conflict Substance abuse   PATIENT STRENGTHS: Ability for insight Wellsite geologistCommunication skills General fund of knowledge   PROBLEM LIST: Problem List/Patient Goals Date to be addressed Date deferred Reason deferred Estimated date of resolution  "I took enough cocaine and didn't care if I lived or died:      Poly substance abuse                                                 DISCHARGE CRITERIA:  Improved stabilization in mood, thinking, and/or behavior  PRELIMINARY DISCHARGE PLAN: Attend 12-step recovery group  PATIENT/FAMIILY INVOLVEMENT: This treatment plan has been presented to and reviewed with the patient, Joshua Thomas, and/or family member,   The patient and family have been given the opportunity to ask questions and make suggestions.  Jerrye BushyLaRonica R Lind Ausley 05/21/2015, 6:51 PM
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
## 2015-05-21 NOTE — Progress Notes (Addendum)
Patient pacing in the unit and has been intrusive.  Patient denies SI/HI and denies AVH.  Patient speaks about using drugs and states he wants help with getting clean.  Patient speech is rapid and tangential.  Patient states he would like to stay for a few days and states he will relapse if he does not.

## 2015-05-21 NOTE — Progress Notes (Signed)
Patient transferred to the unit and [facility]. @2125 

## 2015-05-22 DIAGNOSIS — F1994 Other psychoactive substance use, unspecified with psychoactive substance-induced mood disorder: Secondary | ICD-10-CM

## 2015-05-22 MED ORDER — CLONIDINE HCL 0.1 MG PO TABS
0.1000 mg | ORAL_TABLET | Freq: Three times a day (TID) | ORAL | Status: DC | PRN
  Administered 2015-05-22: 0.1 mg via ORAL
  Filled 2015-05-22: qty 1

## 2015-05-22 MED ORDER — CLONIDINE HCL 0.1 MG PO TABS: 0.1000 mg | ORAL_TABLET | Freq: Every day | ORAL | Status: DC

## 2015-05-22 MED ORDER — ADULT MULTIVITAMIN W/MINERALS CH
1.0000 | ORAL_TABLET | Freq: Every day | ORAL | Status: DC
  Filled 2015-05-22 (×2): qty 1

## 2015-05-22 MED ORDER — OXYCODONE HCL 5 MG PO TABS: 10.0000 mg | ORAL_TABLET | Freq: Four times a day (QID) | ORAL | Status: DC | PRN

## 2015-05-22 MED ORDER — VITAMIN B-1 100 MG PO TABS
100.0000 mg | ORAL_TABLET | Freq: Every day | ORAL | Status: DC
  Filled 2015-05-22: qty 1

## 2015-05-22 MED ORDER — LORAZEPAM 1 MG PO TABS: 1.0000 mg | ORAL_TABLET | Freq: Two times a day (BID) | ORAL | Status: DC

## 2015-05-22 MED ORDER — LORAZEPAM 1 MG PO TABS: 1.0000 mg | ORAL_TABLET | Freq: Three times a day (TID) | ORAL | Status: DC

## 2015-05-22 MED ORDER — METHOCARBAMOL 500 MG PO TABS
500.0000 mg | ORAL_TABLET | Freq: Three times a day (TID) | ORAL | Status: DC | PRN
  Administered 2015-05-22 – 2015-05-23 (×2): 500 mg via ORAL
  Filled 2015-05-22 (×2): qty 1

## 2015-05-22 MED ORDER — CLONAZEPAM 1 MG PO TABS: 1.0000 mg | ORAL_TABLET | Freq: Two times a day (BID) | ORAL | Status: DC

## 2015-05-22 MED ORDER — VITAMIN B-1 100 MG PO TABS
100.0000 mg | ORAL_TABLET | Freq: Every day | ORAL | Status: DC
  Administered 2015-05-23: 100 mg via ORAL
  Filled 2015-05-22 (×3): qty 1

## 2015-05-22 MED ORDER — LORAZEPAM 1 MG PO TABS: 1.0000 mg | ORAL_TABLET | Freq: Every day | ORAL | Status: DC

## 2015-05-22 MED ORDER — HYDROXYZINE HCL 25 MG PO TABS
25.0000 mg | ORAL_TABLET | Freq: Four times a day (QID) | ORAL | Status: DC | PRN
  Administered 2015-05-22 – 2015-05-23 (×3): 25 mg via ORAL
  Filled 2015-05-22: qty 1
  Filled 2015-05-22: qty 10
  Filled 2015-05-22 (×2): qty 1

## 2015-05-22 MED ORDER — LOPERAMIDE HCL 2 MG PO CAPS: 2.0000 mg | ORAL_CAPSULE | ORAL | Status: DC | PRN

## 2015-05-22 MED ORDER — HYDROXYZINE HCL 25 MG PO TABS: 25.0000 mg | ORAL_TABLET | Freq: Four times a day (QID) | ORAL | Status: DC | PRN

## 2015-05-22 MED ORDER — DULOXETINE HCL 30 MG PO CPEP
30.0000 mg | ORAL_CAPSULE | Freq: Every day | ORAL | Status: DC
  Administered 2015-05-23: 30 mg via ORAL
  Filled 2015-05-22 (×3): qty 1

## 2015-05-22 MED ORDER — LORAZEPAM 1 MG PO TABS: 1.0000 mg | ORAL_TABLET | Freq: Four times a day (QID) | ORAL | Status: DC | PRN

## 2015-05-22 MED ORDER — LORAZEPAM 1 MG PO TABS: 1.0000 mg | ORAL_TABLET | Freq: Four times a day (QID) | ORAL | Status: DC

## 2015-05-22 MED ORDER — GABAPENTIN 100 MG PO CAPS
100.0000 mg | ORAL_CAPSULE | Freq: Three times a day (TID) | ORAL | Status: DC
  Administered 2015-05-22 – 2015-05-23 (×2): 100 mg via ORAL
  Filled 2015-05-22 (×6): qty 1

## 2015-05-22 MED ORDER — ONDANSETRON 4 MG PO TBDP: 4.0000 mg | ORAL_TABLET | Freq: Four times a day (QID) | ORAL | Status: DC | PRN

## 2015-05-22 MED ORDER — DULOXETINE HCL 20 MG PO CPEP
20.0000 mg | ORAL_CAPSULE | Freq: Every day | ORAL | Status: DC
  Filled 2015-05-22: qty 1

## 2015-05-22 MED ORDER — CLONIDINE HCL 0.1 MG PO TABS: 0.1000 mg | ORAL_TABLET | ORAL | Status: DC

## 2015-05-22 MED ORDER — LORAZEPAM 1 MG PO TABS
1.0000 mg | ORAL_TABLET | Freq: Four times a day (QID) | ORAL | Status: AC
  Administered 2015-05-22 – 2015-05-23 (×4): 1 mg via ORAL
  Filled 2015-05-22 (×4): qty 1

## 2015-05-22 MED ORDER — LIDOCAINE 5 % EX PTCH
1.0000 | MEDICATED_PATCH | CUTANEOUS | Status: DC
  Filled 2015-05-22: qty 1

## 2015-05-22 MED ORDER — CLONIDINE HCL 0.1 MG PO TABS
0.1000 mg | ORAL_TABLET | Freq: Four times a day (QID) | ORAL | Status: DC
  Administered 2015-05-22: 0.1 mg via ORAL
  Filled 2015-05-22 (×4): qty 1

## 2015-05-22 MED ORDER — THIAMINE HCL 100 MG/ML IJ SOLN
100.0000 mg | Freq: Once | INTRAMUSCULAR | Status: AC
  Administered 2015-05-22: 100 mg via INTRAMUSCULAR
  Filled 2015-05-22: qty 2

## 2015-05-22 MED ORDER — LORAZEPAM 1 MG PO TABS
1.0000 mg | ORAL_TABLET | Freq: Every day | ORAL | Status: DC
Start: 2015-05-26 — End: 2015-05-23

## 2015-05-22 MED ORDER — DICYCLOMINE HCL 20 MG PO TABS: 20.0000 mg | ORAL_TABLET | Freq: Four times a day (QID) | ORAL | Status: DC | PRN

## 2015-05-22 MED ORDER — ADULT MULTIVITAMIN W/MINERALS CH
1.0000 | ORAL_TABLET | Freq: Every day | ORAL | Status: DC
  Administered 2015-05-22 – 2015-05-23 (×2): 1 via ORAL
  Filled 2015-05-22 (×5): qty 1

## 2015-05-22 MED ORDER — THIAMINE HCL 100 MG/ML IJ SOLN: 100.0000 mg | Freq: Once | INTRAMUSCULAR | Status: DC

## 2015-05-22 NOTE — [facility] Suicide Risk Assessment (Addendum)
[facility]   Nursing information obtained from:   patient and chart Demographic factors:   40 year old single male , works as Designer, fashion/clothingroofer  Current Mental Status:   see below  Loss Factors:   recent relapse on cocaine  Historical Factors:   history of cocaine abuse, history of bipolar disorder, history of  Chronic pain Risk Reduction Factors:   resilience   Total Time spent with patient: 45 minutes Principal Problem: Cocaine Dependence, Cocaine Induced Mood Disorder, Bipolar DIsorder  Diagnosis:   Patient Active Problem List   Diagnosis Date Noted  . Bipolar 1 disorder, mixed ([facility]) [F31.60] 05/21/2015  . Polysubstance abuse [F19.10] 05/21/2015  . Substance induced mood disorder ([facility]) [F19.94] 05/21/2015  . Cocaine abuse [F14.10]   . Hepatitis C [B19.20]   . 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  World Science writerHealth Organization [facility](WHO). Score between 0-7:  no or low risk or alcohol related problems. Score between 8-15:  moderate risk of alcohol related problems. Score between 16-19:  high risk of alcohol related problems. Score 20 or above:  warrants further diagnostic evaluation for alcohol dependence and treatment.   CLINICAL FACTORS:  40 year old male, reports history of cocaine dependence- states he recently relapsed after a long period of sobriety. Has history of bipolar disorder, anxiety, and states his mood rapidly deteriorated with cocaine use. Became more depressed, reports he attempted suicide by overdosing on cocaine.  Patient reports being prescribed oxycodone, Klonopin. Agreeing to taper at this time, work on managing pain and anxiety with non habit forming medication strategies      Psychiatric Specialty Exam: ROS   Blood pressure 101/78, pulse 79, temperature 97.9 F (36.6 C), temperature source Oral, resp. rate 16, height 5\' 3"  (1.6 m), weight 155 lb (70.308 kg), SpO2 100 %.Body mass index is 27.46 kg/(m^2).   see admit note MSE                                                       COGNITIVE FEATURES THAT CONTRIBUTE TO RISK:  Closed-mindedness and Loss of executive function    SUICIDE RISK:   Moderate:  Frequent suicidal ideation with limited intensity, and duration, some specificity in terms of plans, no associated intent, good self-control, limited dysphoria/symptomatology, some risk factors present, and identifiable protective factors, including available and accessible social support.  PLAN OF CARE: Patient will be admitted to inpatient psychiatric unit for stabilization and safety. Will provide and encourage milieu participation. Provide medication management and maked adjustments as needed.  Will follow daily.    I certify that inpatient services furnished can reasonably be expected to improve the patient's condition.   Nehemiah MassedOBOS, FERNANDO, MD 05/22/2015, 5:55 PM
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
## 2015-05-22 NOTE — Progress Notes (Signed)
Pt attended karaoke group this evening.  

## 2015-05-23 DIAGNOSIS — F1994 Other psychoactive substance use, unspecified with psychoactive substance-induced mood disorder: Secondary | ICD-10-CM | POA: Clinically undetermined

## 2015-05-23 DIAGNOSIS — F19929 Other psychoactive substance use, unspecified with intoxication, unspecified: Secondary | ICD-10-CM

## 2015-05-23 DIAGNOSIS — F1311 Sedative, hypnotic or anxiolytic abuse, in remission: Secondary | ICD-10-CM | POA: Clinically undetermined

## 2015-05-23 DIAGNOSIS — F1420 Cocaine dependence, uncomplicated: Secondary | ICD-10-CM

## 2015-05-23 DIAGNOSIS — F1120 Opioid dependence, uncomplicated: Secondary | ICD-10-CM | POA: Diagnosis present

## 2015-05-23 DIAGNOSIS — F1310 Sedative, hypnotic or anxiolytic abuse, uncomplicated: Secondary | ICD-10-CM

## 2015-05-23 LAB — URINE CULTURE: Culture: >=100000 — AB

## 2015-05-23 LAB — LIPID PANEL
Cholesterol: 152 mg/dL (ref 0–200)
HDL: 27 mg/dL — AB (ref >40)
LDL CALC: 87 mg/dL (ref 0–99)
TRIGLYCERIDES: 191 mg/dL — AB (ref <150)
Total CHOL/HDL Ratio: 5.6 RATIO
VLDL: 38 mg/dL (ref 0–40)

## 2015-05-23 LAB — TSH: TSH: 1.509 u[IU]/mL (ref 0.350–4.500)

## 2015-05-23 MED ORDER — NICOTINE 21 MG/24HR TD PT24: 21.0000 mg | MEDICATED_PATCH | Freq: Every day | TRANSDERMAL | Status: DC

## 2015-05-23 MED ORDER — MIRTAZAPINE 7.5 MG PO TABS: 7.5000 mg | ORAL_TABLET | Freq: Every day | ORAL | Status: DC

## 2015-05-23 MED ORDER — METHOCARBAMOL 500 MG PO TABS
500.0000 mg | ORAL_TABLET | Freq: Four times a day (QID) | ORAL | Status: DC
  Administered 2015-05-23: 500 mg via ORAL
  Filled 2015-05-23 (×8): qty 1

## 2015-05-23 MED ORDER — MIRTAZAPINE 7.5 MG PO TABS
7.5000 mg | ORAL_TABLET | Freq: Every day | ORAL | Status: DC
  Filled 2015-05-23 (×2): qty 1

## 2015-05-23 MED ORDER — METHOCARBAMOL 500 MG PO TABS: 500.0000 mg | ORAL_TABLET | Freq: Three times a day (TID) | ORAL | Status: DC | PRN

## 2015-05-23 MED ORDER — DULOXETINE HCL 30 MG PO CPEP: 30.0000 mg | ORAL_CAPSULE | Freq: Every day | ORAL | Status: DC

## 2015-05-23 MED ORDER — IBUPROFEN 800 MG PO TABS: ORAL_TABLET | ORAL | Status: DC

## 2015-05-23 MED ORDER — HYDROXYZINE HCL 25 MG PO TABS: ORAL_TABLET | ORAL | Status: DC

## 2015-05-23 MED ORDER — GABAPENTIN 400 MG PO CAPS
400.0000 mg | ORAL_CAPSULE | Freq: Three times a day (TID) | ORAL | Status: DC
  Administered 2015-05-23: 400 mg via ORAL
  Filled 2015-05-23 (×9): qty 1

## 2015-05-23 MED ORDER — CEPHALEXIN 500 MG PO CAPS: 500.0000 mg | ORAL_CAPSULE | Freq: Three times a day (TID) | ORAL | Status: DC

## 2015-05-23 MED ORDER — GABAPENTIN 400 MG PO CAPS: 400.0000 mg | ORAL_CAPSULE | Freq: Three times a day (TID) | ORAL | Status: DC

## 2015-05-23 MED ORDER — IBUPROFEN 800 MG PO TABS: 800.0000 mg | ORAL_TABLET | Freq: Four times a day (QID) | ORAL | Status: DC | PRN

## 2015-05-23 NOTE — [facility] Group Notes (Signed)
[facility]   05/23/2015 11:24 AM  Participation Quality:  Invited. DID NOT ATTEND. Sleeping in room.  Smart, Alexander Aument LCSW

## 2015-05-23 NOTE — Tx Team (Addendum)
Interdisciplinary Treatment Plan Update (Adult)  Date:  05/23/2015  Time Reviewed:  11:25 AM   Progress in Treatment: Attending groups: No. Participating in groups:  No. Taking medication as prescribed:  Yes. Tolerating medication:  Yes. Family/Significant othe contact made:  Pt declined to consent to family contact. SPE completed with pt.  Patient understands diagnosis:  No-pt minimizes mental health and substance use issues.  Discussing patient identified problems/goals with staff:  Yes. Medical problems stabilized or resolved:  Yes. Denies suicidal/homicidal ideation: Yes. Issues/concerns per patient self-inventory:  Other:  Discharge Plan or Barriers: CSW assessing for appropriate referrals.--Pt did not attend morning group.   Reason for Continuation of Hospitalization: None-pt met with MD and is d/cing today  Comments:  Pt has angry affect and mood. He reports his day is "shitty now, they took my medicine." He was at the nurse's station at the beginning of the shift asking about his medications. Pt was informed of the medications scheduled for him tonight and pt became agitated and walked off. He was verbally aggressive to MHT. Pt was at the medication window and initially refused his HS medications. He was informed that an EKG was ordered and he walked away from Probation officer. He then came back for his HS medications and took them after demanding a 72 hour discharge request form. He states "I just want to get out of here in a couple days man, I have all the meds I need in my glovebox." Pt denies SI/HI, denies hallucinations, reports chronic back pain of 5/10. Pt attended evening group. He has been intrusive with peers; paces in the hallway at times  Estimated length of stay:  D/c rescheduled for today  Additional Comments:  Patient and CSW reviewed pt's identified goals and treatment plan. Patient verbalized understanding and agreed to treatment plan. CSW reviewed [facility] "Discharge  Process and Patient Involvement" Form. Pt verbalized understanding of information provided and signed form.    Review of initial/current patient goals per problem list:  1. Goal(s): Patient will participate in aftercare plan  Met: Yes  Target date: at discharge  As evidenced by: Patient will participate within aftercare plan AEB aftercare provider and housing plan at discharge being identified.  5/12: CSW assessing. Pt reported vague interest in [facility] house.  5/12: Pt wants to d/c asap and wants to continue pain medication. He is refusing all referrals.   2. Goal (s): Patient will exhibit decreased depressive symptoms and suicidal ideations.  Met: Yes-adequate for d/c.     Target date: at discharge  As evidenced by: Patient will utilize self rating of depression at 3 or below and demonstrate decreased signs of depression or be deemed stable for discharge by MD.  5/12: Pt presents with depressed mood/agitated affect. He denies SI/HI/AVH.   5/12: Pt reports no depression and is wanting to d/c today.   3. Goal(s): Patient will demonstrate decreased signs of withdrawal due to substance abuse  Met:Yes-adequate for discharge  Target date:at discharge   As evidenced by: Patient will produce a CIWA/COWS score of 0, have stable vitals signs, and no symptoms of withdrawal.  5/12: Pt reports mild withdrawals with COWS of 6 and high BP. All other vitals are stable.   5/12: Pt reports no withdrawals with stable vitals.  Attendees: Patient:   05/23/2015 11:25 AM   Family:   05/23/2015 11:25 AM   Physician:  Dr. Carlton Adam, MD 05/23/2015 11:25 AM   Nursing:   Roanna Banning RN 05/23/2015 11:25 AM  Clinical Social Worker: [place], LCSW 05/23/2015 11:25 AM   Clinical Social Worker: Erasmo Downer Drinkard LCSW 05/23/2015 11:25 AM   Other:  05/23/2015 11:25 AM   Other:  Agustina Caroli, NP 05/23/2015 11:25 AM   Other:   05/23/2015 11:25 AM   Other:  05/23/2015 11:25 AM   Other:   05/23/2015 11:25 AM   Other:  05/23/2015 11:25 AM    05/23/2015 11:25 AM    05/23/2015 11:25 AM    05/23/2015 11:25 AM    05/23/2015 11:25 AM    Scribe for Treatment Team:   Maxie Better, LCSW 05/23/2015 11:25 AM

## 2015-05-23 NOTE — Progress Notes (Signed)
Recreation Therapy Notes  Date: 05.12.2017 Time: 9:30am Location: [address] Dayroom   Group Topic: Stress Management  Goal Area(s) Addresses:  Patient will actively participate in stress management techniques presented during session.   Behavioral Response: Did not attend.   Olita Takeshita L Jasmeet Manton, LRT/CTRS        Elanah Osmanovic L 05/23/2015 4:10 PM 

## 2015-05-23 NOTE — Progress Notes (Signed)
Patient ID: Joshua Thomas, male   DOB: 01/28/1975, 40 y.o.   MRN: [id number] Patient requested discharge this shift.  Patient denies SI, HI and AVH.  Patient denies any additional treatment or outpatient services.  Patient remains irritable and agitated upon discharge.  Patient states that he will remain safe and needs no further hospital treatment.

## 2015-05-23 NOTE — Discharge Summary (Signed)
Physician Discharge Summary Note  Patient:  Joshua Thomas is an 40 y.o., male MRN:  [id number] DOB:  1975/03/19 Patient phone:  [phone] (home)  Patient address:   [address] [place] [address],  Total Time spent with patient: Greater than 30 minutes  Date of Admission:  05/21/2015  Date of Discharge: 05-23-15  Reason for Admission:  Opioid/Benzodiazepine withdrawal symptoms  Principal Problem: Bipolar 1 disorder, mixed [facility])  Discharge Diagnoses: Patient Active Problem List   Diagnosis Date Noted  . Cocaine use disorder, moderate, dependence (HCC) [F14.20] 05/23/2015  . Opioid use disorder, moderate, dependence (HCC) [F11.20] 05/23/2015  . Benzodiazepine abuse in remission [F13.10] 05/23/2015  . 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 Hepatitis C [B19.20]   . 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 Depression   . Bipolar 1 disorder (HCC)   . ADHD (attention deficit hyperactivity disorder)   . Hepatitis C   . Chronic pain     chronic l leg pain  . Sciatica   . Degenerative disc disease, lumbar   . GERD (gastroesophageal reflux disease)   . History of stomach ulcers   . DDD (degenerative disc disease), lumbar   . Hepatitis C     Past Surgical History  Procedure Laterality Date  . Fracture surgery    . Tibia im nail insertion  01/27/2012    Procedure: INTRAMEDULLARY (IM) NAIL TIBIAL;  Surgeon: Kathryne Hitch, MD;  Location: [facility];  Service: Orthopedics;  Laterality: Left;  Removal of IM Rod and Screws Left Tibia with Exchange Nail, Allograft Bone Graft Left Tibia  . Hardware removal  01/27/2012    Procedure: HARDWARE REMOVAL;  Surgeon: Kathryne Hitch, MD;  Location: [facility];  Service: Orthopedics;  Laterality: Left;    Family History:  Family History  Problem Relation Age of Onset  . Diabetes Mother    Family Psychiatric  History: See H&P  Social History:  History  Alcohol Use  . Yes    Comment: occasionally liquor     History  Drug Use  . Yes  . Special: Cocaine    Comment: States he used cocaine tonight    Social History   Social History  . Marital Status: Single    Spouse Name: N/A  . Number of Children: N/A  . Years of Education: N/A   Social History Main Topics  . Smoking status: Current Every Day Smoker -- 1.00 packs/day for 24 years    Types: Cigarettes  . Smokeless tobacco: Former Neurosurgeon    Quit date: 08/08/2006  . Alcohol Use: Yes     Comment: occasionally liquor  . Drug Use: Yes    Special: Cocaine     Comment: States he used cocaine tonight  . Sexual Activity: Not Asked   Other Topics Concern  . None   Social History Narrative   Hospital Course: 40 year old single male. History of Bipolar Disorder and of Cocaine Dependence. After a period of sobriety he relapsed a few days ago and has been using heavily and daily since then (IV cocaine ). States " I went 7 days not sleeping, not drinking, not eating, just using cocaine". He states his son, who is 2 confronted him about his drug use, After which he felt suicidal , with thoughts of overdosing  on cocaine . He states " I did a lot of cocaine, but did not die, but did develop severe chest pain" . He came to ED 2 days ago. Chest pain now resolved. Patient also reports he has been on Klonopin ( for anxiety) and on Oxycodone ( for chronic pain) for more than a year . UDS positive for BZDs, negative for opiates.   [name]'s stay in this hospital was rather very brief, actually less than 24 hours. He was admitted to the hospital for drug detox/substance abuse treatment. He admitted having been using drugs heavily. He stated that he came to the hospital to get the help he needed as he felt a lot of guilt & became suicidal after his  son confronted him of his drug addiction. After his arrival to the unit, he was started on a clonidine/Ativan detox protocols. During his follow-up care/evaluation this morning, [name] demanded to be put back on opioid & benzodiazepine. However, when his request was declined, he asked to be discharged to his home. He denied feeling or being depressed. He denied any suicidal/homicidal ideations. He denies any auditory/visual hallucinations, delusional thoughts or paranoia.    And with his request to be discharged to his home, [name] presents with a good affect, good eye contact, is alert & oriented x 3. He is aware of situation & able to make concrete decisions. He presented no other significant pre-existing medical issues that required treatment or monitoring other than his chronic pain. And because there is no other clinical criteria to keep [name] admitted to the hospital, he is being discharged as requested to his place of residence. He is committed to abstaining from substances as best as he could. Upon discharge, he appears much more in control of his mood & behavior. His symptoms were reported as significantly improved, There are currently, no active SI plans or intent, AVH, delusional thoughts or paranoia. He is going to pursue outpatient treatment in his own terms as as needed. He was provided with medications samples & prescriptions for his non-narcotic medications for his other medical/mental conditions. He left [name] with all personal belongings in no apparent distress. Transportation per his taxi.  Physical Findings: AIMS: Facial and Oral Movements Muscles of Facial Expression: None, normal Lips and Perioral Area: None, normal Jaw: None, normal Tongue: None, normal,Extremity Movements Upper (arms, wrists, hands, fingers): None, normal Lower (legs, knees, ankles, toes): None, normal, Trunk Movements Neck, shoulders, hips: None, normal, Overall Severity Severity of abnormal movements (highest score from  questions above): None, normal Incapacitation due to abnormal movements: None, normal Patient's awareness of abnormal movements (rate only patient's report): No Awareness, Dental Status Current problems with teeth and/or dentures?: Yes Does patient usually wear dentures?: No  CIWA:  CIWA-Ar Total: 6 COWS:  COWS Total Score: 9  Musculoskeletal: Strength & Muscle Tone: within normal limits Gait & Station: normal Patient leans: N/A  Psychiatric Specialty Exam: Review of Systems  Constitutional: Positive for chills, malaise/fatigue and diaphoresis.  HENT: Negative.   Eyes: Negative.   Respiratory: Negative.   Cardiovascular: Negative.   Gastrointestinal: Negative.   Genitourinary: Negative.   Musculoskeletal: Positive for myalgias.  Skin: Negative.   Neurological: Positive for weakness.  Endo/Heme/Allergies: Negative.   Psychiatric/Behavioral: Positive for depression and substance abuse (Polysubstance dependence). Negative for suicidal ideas, hallucinations and memory loss. The patient is nervous/anxious and has insomnia.     Blood pressure 110/70, pulse 68, temperature 97.6 F (36.4 C), temperature source Oral, resp. rate 16, height 5\' 3"  (  1.6 m), weight 70.308 kg (155 lb), SpO2 100 %.Body mass index is 27.46 kg/(m^2).  See Md's SRA   Have you used any form of tobacco in the last 30 days? (Cigarettes, Smokeless Tobacco, Cigars, and/or Pipes): Yes  Has this patient used any form of tobacco in the last 30 days? (Cigarettes, Smokeless Tobacco, Cigars, and/or Pipes) Yes, Yes, A prescription for an FDA-approved tobacco cessation medication was offered at discharge and the patient refused  Blood Alcohol level:  Lab Results  Component Value Date   [facility] <11 07/22/2010   ETH <11 07/14/2010    Metabolic Disorder Labs:  Lab Results  Component Value Date   HGBA1C 5.8* 05/23/2015   MPG 120 05/23/2015   Lab Results  Component Value Date   PROLACTIN 31.3* 05/23/2015   Lab Results   Component Value Date   CHOL 152 05/23/2015   TRIG 191* 05/23/2015   HDL 27* 05/23/2015   CHOLHDL 5.6 05/23/2015   VLDL 38 05/23/2015   LDLCALC 87 05/23/2015   See Psychiatric Specialty Exam and Suicide Risk Assessment completed by Attending Physician prior to discharge.  Discharge destination:  Home  Is patient on multiple antipsychotic therapies at discharge:  No   Has Patient had three or more failed trials of antipsychotic monotherapy by history:  No  Recommended Plan for Multiple Antipsychotic Therapies: NA    Medication List    STOP taking these medications        BC HEADACHE POWDER PO     clonazePAM 1 MG tablet  Commonly known as:  KLONOPIN     Oxycodone HCl 10 MG Tabs     QUEtiapine 100 MG tablet  Commonly known as:  SEROQUEL      TAKE these medications      Indication   cephALEXin 500 MG capsule  Commonly known as:  KEFLEX  Take 1 capsule (500 mg total) by mouth every 8 (eight) hours. For infection   Indication:  Infection     DULoxetine 30 MG capsule  Commonly known as:  CYMBALTA  Take 1 capsule (30 mg total) by mouth daily. For depression   Indication:  Major Depressive Disorder, Musculoskeletal Pain     gabapentin 400 MG capsule  Commonly known as:  NEURONTIN  Take 1 capsule (400 mg total) by mouth 4 (four) times daily - after meals and at bedtime. For agitation/pain   Indication:  Agitation, Pain     hydrOXYzine 25 MG tablet  Commonly known as:  ATARAX/VISTARIL  Take 1 tablet (25 mg) four times daily: For anxiety   Indication:  Anxiety     ibuprofen 800 MG tablet  Commonly known as:  ADVIL,MOTRIN  Take 1 tablet (800 mg) Four times daily as needed: For pain   Indication:  moderated-severe pain     methocarbamol 500 MG tablet  Commonly known as:  ROBAXIN  Take 1 tablet (500 mg total) by mouth every 8 (eight) hours as needed for muscle spasms.   Indication:  Musculoskeletal Pain     mirtazapine 7.5 MG tablet  Commonly known as:  REMERON   Take 1 tablet (7.5 mg total) by mouth at bedtime. For sleep   Indication:  Trouble Sleeping     nicotine 21 mg/24hr patch  Commonly known as:  NICODERM CQ - dosed in mg/24 hours  Place 1 patch (21 mg total) onto the skin daily. For smoking cessation   Indication:  Nicotine Addiction       Follow-up Information  Follow up with Patient declined all services. .    Follow-up recommendations:  Activity:  As tolerated Diet: As recommended by your primary care doctor. Keep all scheduled follow-up appointments as recommended.  Comments: Take all your medications as prescribed by your mental healthcare provider. Report any adverse effects and or reactions from your medicines to your outpatient provider promptly. Patient is instructed and cautioned to not engage in alcohol and or illegal drug use while on prescription medicines. In the event of worsening symptoms, patient is instructed to call the crisis hotline, 911 and or go to the nearest ED for appropriate evaluation and treatment of symptoms. Follow-up with your primary care provider for your other medical issues, concerns and or health care needs.   Signed: Sanjuana Kava, NP, PMHNP, FNP-BC 05/24/2015, 4:14 PM

## 2015-05-23 NOTE — Progress Notes (Signed)
  [facility] :  Will you be returning to the same living situation after discharge:  Yes,  home At discharge, do you have transportation home?: Yes,  pt has no way to get home. Taxi voucher needed. Do you have the ability to pay for your medications: Yes,  mental health  Release of information consent forms completed and submitted to medical records by CSW.  Patient to Follow up at: Follow-up Information    Follow up with Patient declined all services. .      Next level of care provider [facility]:no  Safety Planning and Suicide Prevention discussed: Yes,  SPE completed with pt, as he declined to consent to family contact. SPI pamphlet and mobile crisis information provided to pt.   Have you used any form of tobacco in the last 30 days? (Cigarettes, Smokeless Tobacco, Cigars, and/or Pipes): Yes  Has patient been referred to the Quitline?: Patient refused referral  Patient has been referred for addiction treatment: Pt. refused referral  Smart, Jean Alejos LCSW 05/23/2015, 2:20 PM

## 2015-05-23 NOTE — [facility] Suicide Risk Assessment (Signed)
[facility] INPATIENT:  Family/Significant Other Suicide Prevention Education  Suicide Prevention Education:  Patient Refusal for Family/Significant Other Suicide Prevention Education: The patient Joshua Thomas has refused to provide written consent for family/significant other to be provided Family/Significant Other Suicide Prevention Education during admission and/or prior to discharge.  Physician notified.  SPE completed with pt, as pt refused to consent to family contact. SPI pamphlet provided to pt and pt was encouraged to share information with support network, ask questions, and talk about any concerns relating to SPE. Pt denies access to guns/firearms and verbalized understanding of information provided. Mobile Crisis information also provided to pt.   Smart, Asjah Rauda LCSW 05/23/2015, 11:28 AM

## 2015-05-23 NOTE — [facility] Suicide Risk Assessment (Signed)
[facility]   Principal Problem: Substance or medication-induced bipolar and related disorder with onset during intoxication [facility](HCC) Discharge Diagnoses:  Patient Active Problem List   Diagnosis Date Noted  . Cocaine use disorder, moderate, dependence (HCC) [F14.20] 05/23/2015  . Opioid use disorder, moderate, dependence (HCC) [F11.20] 05/23/2015  . Benzodiazepine abuse in remission [F13.10] 05/23/2015  . 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 Hepatitis C [B19.20]   . 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 Negative for suicidal ideas.  All other systems reviewed and are negative.   Blood pressure 110/70, pulse 68, temperature 97.6 F (36.4 C), temperature source Oral, resp. rate 16, height 5\' 3"  (1.6 m), weight 70.308 kg (155 lb), SpO2 100 %.Body mass index is 27.46 kg/(m^2).  General Appearance: Casual  Eye Contact::  Fair  Speech:  Clear and Coherent409  Volume:  Normal  Mood:  Anxious  Affect:  Congruent  Thought Process:  Coherent  Orientation:  Full (Time, Place, and Person)  Thought Content:  WDL  Suicidal Thoughts:  No  Homicidal Thoughts:  No  Memory:  Immediate;   Fair Recent;   Fair Remote;   Fair  Judgement:  Impaired  Insight:  Shallow  Psychomotor Activity:  Normal  Concentration:  Fair  Recall:  FiservFair  Fund of Knowledge:Fair  Language: Fair  Akathisia:  No  Handed:  Right  AIMS (if indicated):     Assets:  Desire for Improvement  Sleep:  Number of Hours: 3.5  Cognition: WNL  ADL's:  Intact   Mental Status Per Nursing Assessment::   On Admission:      Demographic Factors:  Male and Caucasian  Loss Factors: NA  Historical Factors: Impulsivity  Risk Reduction Factors:   Positive therapeutic relationship  Continued Clinical Symptoms:  Alcohol/Substance Abuse/Dependencies Previous Psychiatric Diagnoses and Treatments  Cognitive Features That Contribute To Risk:  Polarized thinking    Suicide Risk:  Minimal: No identifiable suicidal ideation.  Patients presenting with no risk factors but with morbid ruminations; may be classified as minimal risk based on the severity of the depressive symptoms    Plan Of Care/Follow-up recommendations:  Activity:  No restrictions Diet:  regular Tests:  as needed Other:  none  Nakyia Dau, MD 05/23/2015, 2:05 PM

## 2015-05-23 NOTE — Progress Notes (Signed)
D: Pt has angry affect and mood.  He reports his day is "shitty now, they took my medicine."  He was at the nurse's station at the beginning of the shift asking about his medications.  Pt was informed of the medications scheduled for him tonight and pt became agitated and walked off.  He was verbally aggressive to MHT.  Pt was at the medication window and initially refused his HS medications.  He was informed that an EKG was ordered and he walked away from Clinical research associatewriter.  He then came back for his HS medications and took them after demanding a 72 hour discharge request form.  He states "I just want to get out of here in a couple days man, I have all the meds I need in my glovebox."  Pt denies SI/HI, denies hallucinations, reports chronic back pain of 5/10.  Pt attended evening group.  He has been intrusive with peers; paces in the hallway at times.   A: Medication education provided.  Support and encouragement offered.  Attempted to redirect pt as needed.  Provided pt with specimen cup for urinalysis.  72 hour discharge request form reviewed and signed with pt.  Medications administered per order.  PRN medication administered for opiate withdrawal.  PRN medication offered for pain, pt refused stating "that's not gonna do nothing." R: Pt is compliant with scheduled medications after initially refusing them.  Pt has not provided urine specimen.  He lacks insight and is not engaged in his treatment.  Pt verbally contracts for safety.  Will continue to monitor and assess.

## 2015-05-24 LAB — HEMOGLOBIN A1C
Hgb A1c MFr Bld: 5.8 % — ABNORMAL HIGH (ref 4.8–5.6)
MEAN PLASMA GLUCOSE: 120 mg/dL

## 2015-05-24 LAB — PROLACTIN: PROLACTIN: 31.3 ng/mL — AB (ref 4.0–15.2)

## 2015-07-09 ENCOUNTER — Emergency Department: Payer: Self-pay

## 2015-07-09 ENCOUNTER — Emergency Department
Admission: EM | Admit: 2015-07-09 | Discharge: 2015-07-09 | Disposition: A | Discharge status: Home / Self Care | Payer: Self-pay | Attending: Emergency Medicine | Admitting: Emergency Medicine

## 2015-07-09 DIAGNOSIS — S7001XA Contusion of right hip, initial encounter: Secondary | ICD-10-CM | POA: Insufficient documentation

## 2015-07-09 DIAGNOSIS — F1410 Cocaine abuse, uncomplicated: Secondary | ICD-10-CM | POA: Insufficient documentation

## 2015-07-09 DIAGNOSIS — Y9301 Activity, walking, marching and hiking: Secondary | ICD-10-CM | POA: Insufficient documentation

## 2015-07-09 DIAGNOSIS — Y999 Unspecified external cause status: Secondary | ICD-10-CM | POA: Insufficient documentation

## 2015-07-09 DIAGNOSIS — F319 Bipolar disorder, unspecified: Secondary | ICD-10-CM | POA: Insufficient documentation

## 2015-07-09 DIAGNOSIS — Z79899 Other long term (current) drug therapy: Secondary | ICD-10-CM | POA: Insufficient documentation

## 2015-07-09 DIAGNOSIS — F17210 Nicotine dependence, cigarettes, uncomplicated: Secondary | ICD-10-CM | POA: Insufficient documentation

## 2015-07-09 DIAGNOSIS — F909 Attention-deficit hyperactivity disorder, unspecified type: Secondary | ICD-10-CM | POA: Insufficient documentation

## 2015-07-09 DIAGNOSIS — Y92410 Unspecified street and highway as the place of occurrence of the external cause: Secondary | ICD-10-CM | POA: Insufficient documentation

## 2015-07-09 DIAGNOSIS — M5136 Other intervertebral disc degeneration, lumbar region: Secondary | ICD-10-CM | POA: Insufficient documentation

## 2015-07-09 LAB — URINALYSIS COMPLETE WITH MICROSCOPIC (ARMC ONLY)
Bacteria, UA: NONE SEEN
Bilirubin Urine: NEGATIVE
GLUCOSE, UA: NEGATIVE mg/dL
Hgb urine dipstick: NEGATIVE
KETONES UR: NEGATIVE mg/dL
Leukocytes, UA: NEGATIVE
NITRITE: NEGATIVE
PROTEIN: NEGATIVE mg/dL
SPECIFIC GRAVITY, URINE: 1.013 (ref 1.005–1.030)
pH: 6 (ref 5.0–8.0)

## 2015-07-09 MED ORDER — KETOROLAC TROMETHAMINE 30 MG/ML IJ SOLN
30.0000 mg | Freq: Once | INTRAMUSCULAR | Status: AC
  Administered 2015-07-09: 30 mg via INTRAMUSCULAR
  Filled 2015-07-09: qty 1
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
## 2015-07-09 NOTE — ED Notes (Signed)
Pt noted to be walking around in lobby one hour prior to arrival, came with another pt. No acute distress noted.
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
## 2015-07-09 NOTE — ED Notes (Signed)
Pt using excessive amounts of hand sanitizer in lobby, ambulating across lobby with no difficulty or distress noted.
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
## 2015-09-06 ENCOUNTER — Emergency Department (HOSPITAL_COMMUNITY)
Admission: EM | Admit: 2015-09-06 | Discharge: 2015-09-06 | Disposition: A | Discharge status: Home / Self Care | Payer: Self-pay | Attending: Emergency Medicine | Admitting: Emergency Medicine

## 2015-09-06 ENCOUNTER — Encounter (HOSPITAL_COMMUNITY): Payer: Self-pay | Admitting: Emergency Medicine

## 2015-09-06 DIAGNOSIS — F909 Attention-deficit hyperactivity disorder, unspecified type: Secondary | ICD-10-CM | POA: Insufficient documentation

## 2015-09-06 DIAGNOSIS — Y929 Unspecified place or not applicable: Secondary | ICD-10-CM | POA: Insufficient documentation

## 2015-09-06 DIAGNOSIS — M545 Low back pain, unspecified: Secondary | ICD-10-CM

## 2015-09-06 DIAGNOSIS — Y999 Unspecified external cause status: Secondary | ICD-10-CM | POA: Insufficient documentation

## 2015-09-06 DIAGNOSIS — X509XXA Other and unspecified overexertion or strenuous movements or postures, initial encounter: Secondary | ICD-10-CM | POA: Insufficient documentation

## 2015-09-06 DIAGNOSIS — F17210 Nicotine dependence, cigarettes, uncomplicated: Secondary | ICD-10-CM | POA: Insufficient documentation

## 2015-09-06 DIAGNOSIS — Y9389 Activity, other specified: Secondary | ICD-10-CM | POA: Insufficient documentation

## 2015-09-06 MED ORDER — TRAMADOL HCL 50 MG PO TABS: 50.0000 mg | ORAL_TABLET | Freq: Four times a day (QID) | ORAL | 0 refills | Status: DC | PRN

## 2015-09-06 MED ORDER — OXYCODONE-ACETAMINOPHEN 7.5-325 MG PO TABS: 1.0000 | ORAL_TABLET | Freq: Once | ORAL | Status: DC

## 2015-09-06 MED ORDER — METHOCARBAMOL 500 MG PO TABS: 500.0000 mg | ORAL_TABLET | Freq: Two times a day (BID) | ORAL | 0 refills | Status: DC

## 2015-09-06 MED ORDER — OXYCODONE-ACETAMINOPHEN 5-325 MG PO TABS
1.0000 | ORAL_TABLET | Freq: Once | ORAL | Status: AC
  Administered 2015-09-06: 1 via ORAL
  Filled 2015-09-06: qty 1

## 2015-09-06 NOTE — Discharge Instructions (Signed)
Please use ibuprofen and ice as needed for pain. Follow up with neurosurgery for further evaluation and management. Please return immediately if you experience any new or worsening sign or symptoms.
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
## 2015-09-06 NOTE — ED Notes (Signed)
Declined W/C at D/C and was escorted to lobby by RN. 

## 2015-09-10 ENCOUNTER — Emergency Department (HOSPITAL_COMMUNITY)
Admission: EM | Admit: 2015-09-10 | Discharge: 2015-09-10 | Disposition: A | Discharge status: Home / Self Care | Payer: Self-pay | Attending: Emergency Medicine | Admitting: Emergency Medicine

## 2015-09-10 ENCOUNTER — Encounter (HOSPITAL_COMMUNITY): Payer: Self-pay | Admitting: *Deleted

## 2015-09-10 DIAGNOSIS — M549 Dorsalgia, unspecified: Secondary | ICD-10-CM

## 2015-09-10 DIAGNOSIS — F17210 Nicotine dependence, cigarettes, uncomplicated: Secondary | ICD-10-CM | POA: Insufficient documentation

## 2015-09-10 DIAGNOSIS — F909 Attention-deficit hyperactivity disorder, unspecified type: Secondary | ICD-10-CM | POA: Insufficient documentation

## 2015-09-10 DIAGNOSIS — G8929 Other chronic pain: Secondary | ICD-10-CM | POA: Insufficient documentation

## 2015-09-10 DIAGNOSIS — M545 Low back pain: Secondary | ICD-10-CM | POA: Insufficient documentation

## 2015-09-10 MED ORDER — TRAMADOL HCL 50 MG PO TABS: 50.0000 mg | ORAL_TABLET | Freq: Four times a day (QID) | ORAL | 0 refills | Status: DC | PRN

## 2015-09-10 MED ORDER — CYCLOBENZAPRINE HCL 10 MG PO TABS: 10.0000 mg | ORAL_TABLET | Freq: Two times a day (BID) | ORAL | 0 refills | Status: DC | PRN

## 2015-09-10 MED ORDER — OXYCODONE HCL 5 MG PO TABS
5.0000 mg | ORAL_TABLET | Freq: Once | ORAL | Status: AC
  Administered 2015-09-10: 5 mg via ORAL
  Filled 2015-09-10: qty 1

## 2015-09-10 NOTE — ED Triage Notes (Signed)
The pt is c/o lower back pain for one week  He has chronic back pain but he reinjured his back and since then his pain has been worse

## 2015-09-10 NOTE — ED Provider Notes (Signed)
[facility]-EMERGENCY DEPT Provider Note   CSN: [id number] Arrival date & time: 09/10/15  1730  By signing my name below, I, Linna Darner, attest that this documentation has been prepared under the direction and in the presence of Kerrie Buffalo, NP. Electronically Signed: Linna Darner, Scribe. 09/10/2015. 6:17 PM.  History   Chief Complaint Chief Complaint  Patient presents with  . Back Pain    The history is provided by the patient. No language interpreter was used.  Back Pain   This is a chronic problem. The current episode started more than 2 days ago. The problem occurs constantly. The problem has not changed since onset.The pain is present in the lumbar spine. The quality of the pain is described as shooting. The pain radiates to the right thigh. The pain is at a severity of 8/10. The pain is severe. The symptoms are aggravated by certain positions. The pain is the same all the time. Associated symptoms include leg pain (right thigh). Pertinent negatives include no fever, no headaches, no bowel incontinence, no bladder incontinence and no dysuria. He has tried NSAIDs for the symptoms. The treatment provided no relief.     HPI Comments: Joshua Thomas is a 40 y.o. male with PMHx of sciatica, DDD, and chronic back pain who presents to the Emergency Department complaining of exacerbation of his chronic lower back pain ongoing for the last week. Pt reports he was seen here three days ago for the same reason and was told he may have slipped a disc in his lower back. He was prescribed tramadol and robaxin with no improvement in his pain; he was told to return if his pain did not improve. Pt did not have x-rays taken during his last ER visit. Pt reports he has used oxycodone 10 mg (given to him by his father) in the past with good relief of his back pain. He rates his current pain as 8/10 and states he cannot stand it anymore. Pt has tried [employer] over the last several days with no relief of his  pain; he states he is not supposed to use NSAIDs due to liver problems. He endorses pain exacerbation with palpation to his lower back and certain positions. Pt states he does not have health insurance but has seen Dr. Janna Arch in [place] for his back pain. 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 He reports his current pain feels like sciatica; he notes pain radiation down his right lower extremity. Pt notes a h/o cocaine abuse and IV drug abuse many years ago; he reports he relapsed and used cocaine a few months ago. He denies bowel/bladder incontinence, nausea, vomiting, fever, chills, frequency, dysuria, headache, LOC, or any other associated symptoms.  Past Medical History:  Diagnosis Date  . ADHD (attention deficit hyperactivity disorder)   . Bipolar 1 disorder (HCC)   . Chronic pain    chronic l leg pain  . DDD (degenerative disc disease), lumbar   . Degenerative disc disease, lumbar   . Depression   . GERD (gastroesophageal reflux disease)   . Hepatitis C   . Hepatitis C   . History of stomach ulcers   . Sciatica     Patient Active Problem List   Diagnosis Date Noted  . Cocaine use disorder, moderate, dependence (HCC) 05/23/2015  . Opioid use disorder, moderate, dependence (HCC) 05/23/2015  . Benzodiazepine abuse in remission 05/23/2015  . Substance or medication-induced bipolar and related  disorder with onset during intoxication (HCC) 05/23/2015  . Hepatitis C   . 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 FRACTURE SURGERY    . HARDWARE REMOVAL  01/27/2012   Procedure: HARDWARE REMOVAL;  Surgeon: Kathryne Hitch, MD;  Location: [facility];  Service: Orthopedics;  Laterality: Left;  . TIBIA IM NAIL INSERTION  01/27/2012   Procedure: INTRAMEDULLARY (IM) NAIL TIBIAL;  Surgeon: Kathryne Hitch, MD;  Location: [facility];  Service:  Orthopedics;  Laterality: Left;  Removal of IM Rod and Screws Left Tibia with Exchange Nail, Allograft Bone Graft Left Tibia       Home Medications    Prior to Admission medications   Medication Sig Start Date End Date Taking? Authorizing Provider  cephALEXin (KEFLEX) 500 MG capsule Take 1 capsule (500 mg total) by mouth every 8 (eight) hours. For infection 05/23/15   Sanjuana Kava, NP  cyclobenzaprine (FLEXERIL) 10 MG tablet Take 1 tablet (10 mg total) by mouth 2 (two) times daily as needed for muscle spasms. 09/10/15   Hope Orlene Och, NP  DULoxetine (CYMBALTA) 30 MG capsule Take 1 capsule (30 mg total) by mouth daily. For depression 05/23/15   Sanjuana Kava, NP  gabapentin (NEURONTIN) 400 MG capsule Take 1 capsule (400 mg total) by mouth 4 (four) times daily - after meals and at bedtime. For agitation/pain 05/23/15   Sanjuana Kava, NP  hydrOXYzine (ATARAX/VISTARIL) 25 MG tablet Take 1 tablet (25 mg) four times daily: For anxiety 05/23/15   Sanjuana Kava, NP  ibuprofen (ADVIL,MOTRIN) 800 MG tablet Take 1 tablet (800 mg) Four times daily as needed: For pain 05/23/15   Sanjuana Kava, NP  methocarbamol (ROBAXIN) 500 MG tablet Take 1 tablet (500 mg total) by mouth 2 (two) times daily. 09/06/15   Eyvonne Mechanic, PA-C  mirtazapine (REMERON) 7.5 MG tablet Take 1 tablet (7.5 mg total) by mouth at bedtime. For sleep 05/23/15   Sanjuana Kava, NP  nicotine (NICODERM CQ - DOSED IN MG/24 HOURS) 21 mg/24hr patch Place 1 patch (21 mg total) onto the skin daily. For smoking cessation 05/23/15   Sanjuana Kava, NP  traMADol (ULTRAM) 50 MG tablet Take 1 tablet (50 mg total) by mouth every 6 (six) hours as needed. 09/10/15   Hope Orlene Och, NP    Family History Family History  Problem Relation Age of Onset  . Diabetes Mother     Social History Social History  Substance Use Topics  . Smoking status: Current Every Day Smoker    Packs/day: 1.00    Years: 24.00    Types: Cigarettes  . Smokeless tobacco: Former Neurosurgeon     Quit date: 08/08/2006  . Alcohol use Yes     Comment: occasionally liquor     Allergies   Acetaminophen; Morphine and related; Buprenorphine hcl; and Meperidine   Review of Systems Review of Systems  Constitutional: Negative for chills and fever.  Gastrointestinal: Negative for bowel incontinence, nausea and vomiting.       Negative for bowel incontinence.  Genitourinary: Negative for bladder incontinence, dysuria and frequency.       Negative for bladder incontinence.  Musculoskeletal: Positive for back pain and myalgias (RLE).  Neurological: Negative for syncope and headaches.    Physical Exam Updated Vital Signs BP 140/82 (BP Location: Left Arm)   Pulse 65   Temp 98.7 F (37.1 C)  Resp 16   Ht 5\' 3"  (1.6 m)   Wt 66.9 kg   SpO2 99%   BMI 26.14 kg/m   Physical Exam  Constitutional: He is oriented to person, place, and time. He appears well-developed and well-nourished. No distress.  HENT:  Head: Normocephalic and atraumatic.  Eyes: Conjunctivae and EOM are normal.  Neck: Neck supple. No tracheal deviation present.  Cardiovascular: Normal rate, regular rhythm and intact distal pulses.   Pulmonary/Chest: Effort normal. No respiratory distress.  Lungs are clear bilaterally.  Abdominal: Soft. Bowel sounds are normal. There is no tenderness.  Musculoskeletal: Normal range of motion.       Lumbar back: He exhibits tenderness and spasm. He exhibits no deformity and normal pulse. Decreased range of motion: due to pain.       Back:  No cervical or thoracic spine tenderness. Tenderness over right sciatic nerve.  Neurological: He is alert and oriented to person, place, and time. He has normal strength. No sensory deficit. Gait normal.  Reflex Scores:      Bicep reflexes are 2+ on the right side and 2+ on the left side.      Brachioradialis reflexes are 2+ on the right side and 2+ on the left side.      Patellar reflexes are 2+ on the right side and 2+ on the left  side. Steady gait with no foot drag.  Skin: Skin is warm and dry.  Psychiatric: He has a normal mood and affect. His behavior is normal.  Nursing note and vitals reviewed.   ED Treatments / Results  Labs (all labs ordered are listed, but only abnormal results are displayed) Labs Reviewed - No data to display  Radiology No results found.  Procedures Procedures (including critical care time)  DIAGNOSTIC STUDIES: Oxygen Saturation is 98% on RA, normal by my interpretation.    COORDINATION OF CARE: 6:17 PM Discussed treatment plan with pt at bedside and pt agreed to plan.  7:24 PM Case Manager came and talked to patient. [facility].  Medications Ordered in ED Medications  oxyCODONE (Oxy IR/ROXICODONE) immediate release tablet 5 mg (5 mg Oral Given 09/10/15 1855)     Initial Impression / Assessment and Plan / ED Course  I have reviewed the triage vital signs and the nursing notes.  Pertinent labs & imaging results that were available during my care of the patient were reviewed by me and considered in my medical decision making (see chart for details).  Clinical Course  Patient with back pain.  No neurological deficits and normal neuro exam.  Patient is ambulatory.  No loss of bowel or bladder control.  No concern for cauda equina.  No fever, night sweats, weight loss, h/o cancer, IVDA, no recent procedure to back. No urinary symptoms suggestive of UTI.  Supportive care and return precaution discussed. Appears safe for discharge at this time. Follow up as indicated in discharge paperwork.  I personally performed the services described in this documentation, which was scribed in my presence. The recorded information has been reviewed and is accurate.   Final Clinical Impressions(s) / ED Diagnoses   Final diagnoses:  Chronic back pain    New Prescriptions Discharge Medication List as of 09/10/2015  7:23 PM    START  taking these medications   Details  cyclobenzaprine (FLEXERIL) 10 MG tablet Take 1 tablet (10 mg total) by mouth 2 (two) times daily as needed for muscle  spasms., Starting Wed 09/10/2015, [address] [place], NP 09/13/15 2239    Gerhard Munch, MD 09/13/15 [phone]

## 2015-09-29 ENCOUNTER — Inpatient Hospital Stay: Payer: Self-pay | Admitting: Internal Medicine

## 2015-12-02 ENCOUNTER — Emergency Department
Admission: EM | Admit: 2015-12-02 | Discharge: 2015-12-02 | Disposition: A | Discharge status: Home / Self Care | Payer: Self-pay | Attending: Emergency Medicine | Admitting: Emergency Medicine

## 2015-12-02 ENCOUNTER — Encounter: Payer: Self-pay | Admitting: Emergency Medicine

## 2015-12-02 ENCOUNTER — Emergency Department: Payer: Self-pay

## 2015-12-02 DIAGNOSIS — F17210 Nicotine dependence, cigarettes, uncomplicated: Secondary | ICD-10-CM | POA: Insufficient documentation

## 2015-12-02 DIAGNOSIS — F909 Attention-deficit hyperactivity disorder, unspecified type: Secondary | ICD-10-CM | POA: Insufficient documentation

## 2015-12-02 DIAGNOSIS — M79603 Pain in arm, unspecified: Secondary | ICD-10-CM | POA: Insufficient documentation

## 2015-12-02 DIAGNOSIS — R6889 Other general symptoms and signs: Secondary | ICD-10-CM

## 2015-12-02 DIAGNOSIS — R079 Chest pain, unspecified: Secondary | ICD-10-CM | POA: Insufficient documentation

## 2015-12-02 LAB — BASIC METABOLIC PANEL
Anion gap: 9 (ref 5–15)
BUN: 12 mg/dL (ref 6–20)
CHLORIDE: 106 mmol/L (ref 101–111)
CO2: 24 mmol/L (ref 22–32)
Calcium: 9.1 mg/dL (ref 8.9–10.3)
Creatinine, Ser: 0.91 mg/dL (ref 0.61–1.24)
GFR calc Af Amer: >60 mL/min (ref >60)
GLUCOSE: 124 mg/dL — AB (ref 65–99)
POTASSIUM: 3.7 mmol/L (ref 3.5–5.1)
Sodium: 139 mmol/L (ref 135–145)

## 2015-12-02 LAB — TROPONIN I: Troponin I: <0.03 ng/mL (ref <0.03)

## 2015-12-02 LAB — CBC
HEMATOCRIT: 44.3 % (ref 40.0–52.0)
Hemoglobin: 14.9 g/dL (ref 13.0–18.0)
MCH: 32.7 pg (ref 26.0–34.0)
MCHC: 33.5 g/dL (ref 32.0–36.0)
MCV: 97.5 fL (ref 80.0–100.0)
Platelets: 152 10*3/uL (ref 150–440)
RBC: 4.54 MIL/uL (ref 4.40–5.90)
RDW: 14.3 % (ref 11.5–14.5)
WBC: 5.6 10*3/uL (ref 3.8–10.6)

## 2015-12-02 NOTE — Discharge Instructions (Signed)
Return to the emergency room for any new or worrisome symptoms include chest pain, short of breath, nausea, vomiting, diarrhea, fever, chills, numbness, weakness, thoughts of hurting yourself or others, or if you feel worse in any way. Continue to avoid drug use and follow closely as an outpatient

## 2015-12-02 NOTE — ED Provider Notes (Addendum)
[facility] Emergency Department Provider Note  ____________________________________________   I have reviewed the triage vital signs and the nursing notes.   HISTORY  Chief Complaint Arm Pain and Chest Pain    HPI Joshua Thomas is a 40 y.o. male who presents with a host of different somewhat difficult to understand complaints. He has no SI or HI he states. He was recently in jail, came out of jail, use narcotics. This is a relapse he had been sober for 8 months aside from Suboxone when she gets off the street. In any event, he states that after he took heroin again recently, he has been feeling jittery and has had multiple different complaints. He denies chest pain to me he states he is not short of breath to me, he states sometimes his hands cramp up and he is worried that he may have cancer because he seems like he has lost weight over the last few years.He wants to have "that dye" in his whole body to make sure there is no evidence of cancer anywhere. He states he is not having chest pain but he worried that he could. He has a history of drug abuse and is worried "what is done to his body"      Past Medical History:  Diagnosis Date  . ADHD (attention deficit hyperactivity disorder)   . Bipolar 1 disorder (HCC)   . Chronic pain    chronic l leg pain  . DDD (degenerative disc disease), lumbar   . Degenerative disc disease, lumbar   . Depression   . GERD (gastroesophageal reflux disease)   . Hepatitis C   . Hepatitis C   . History of stomach ulcers   . Sciatica     Patient Active Problem List   Diagnosis Date Noted  . Cocaine use disorder, moderate, dependence (HCC) 05/23/2015  . Opioid use disorder, moderate, dependence (HCC) 05/23/2015  . Benzodiazepine abuse in remission 05/23/2015  . Substance or medication-induced bipolar and related disorder with onset during intoxication (HCC) 05/23/2015  . Hepatitis C   . Closed fracture of shaft of left  tibia with nonunion 01/27/2012    Past Surgical History:  Procedure Laterality Date  . FRACTURE SURGERY    . HARDWARE REMOVAL  01/27/2012   Procedure: HARDWARE REMOVAL;  Surgeon: Kathryne Hitch, MD;  Location: [facility];  Service: Orthopedics;  Laterality: Left;  . TIBIA IM NAIL INSERTION  01/27/2012   Procedure: INTRAMEDULLARY (IM) NAIL TIBIAL;  Surgeon: Kathryne Hitch, MD;  Location: [facility];  Service: Orthopedics;  Laterality: Left;  Removal of IM Rod and Screws Left Tibia with Exchange Nail, Allograft Bone Graft Left Tibia    Prior to Admission medications   Medication Sig Start Date End Date Taking? Authorizing Provider  cephALEXin (KEFLEX) 500 MG capsule Take 1 capsule (500 mg total) by mouth every 8 (eight) hours. For infection 05/23/15   Sanjuana Kava, NP  cyclobenzaprine (FLEXERIL) 10 MG tablet Take 1 tablet (10 mg total) by mouth 2 (two) times daily as needed for muscle spasms. 09/10/15   Hope Orlene Och, NP  DULoxetine (CYMBALTA) 30 MG capsule Take 1 capsule (30 mg total) by mouth daily. For depression 05/23/15   Sanjuana Kava, NP  gabapentin (NEURONTIN) 400 MG capsule Take 1 capsule (400 mg total) by mouth 4 (four) times daily - after meals and at bedtime. For agitation/pain 05/23/15   Sanjuana Kava, NP  hydrOXYzine (ATARAX/VISTARIL) 25 MG tablet Take 1 tablet (  25 mg) four times daily: For anxiety 05/23/15   Sanjuana KavaAgnes I Nwoko, NP  ibuprofen (ADVIL,MOTRIN) 800 MG tablet Take 1 tablet (800 mg) Four times daily as needed: For pain 05/23/15   Sanjuana KavaAgnes I Nwoko, NP  methocarbamol (ROBAXIN) 500 MG tablet Take 1 tablet (500 mg total) by mouth 2 (two) times daily. 09/06/15   Eyvonne MechanicJeffrey Hedges, PA-C  mirtazapine (REMERON) 7.5 MG tablet Take 1 tablet (7.5 mg total) by mouth at bedtime. For sleep 05/23/15   Sanjuana KavaAgnes I Nwoko, NP  nicotine (NICODERM CQ - DOSED IN MG/24 HOURS) 21 mg/24hr patch Place 1 patch (21 mg total) onto the skin daily. For smoking cessation 05/23/15   Sanjuana KavaAgnes I Nwoko, NP  traMADol (ULTRAM)  50 MG tablet Take 1 tablet (50 mg total) by mouth every 6 (six) hours as needed. 09/10/15   Hope Orlene OchM Neese, NP    Allergies Acetaminophen; Morphine and related; Buprenorphine hcl; and Meperidine  Family History  Problem Relation Age of Onset  . Diabetes Mother     Social History Social History  Substance Use Topics  . Smoking status: Current Every Day Smoker    Packs/day: 1.00    Years: 24.00    Types: Cigarettes  . Smokeless tobacco: Former NeurosurgeonUser    Quit date: 08/08/2006  . Alcohol use Yes     Comment: occasionally liquor    Review of Systems Constitutional: No fever/chills Eyes: No visual changes. ENT: No sore throat. No stiff neck no neck pain Cardiovascular: Denies chest pain. Respiratory: Denies shortness of breath. Gastrointestinal:   no vomiting.  No diarrhea.  No constipation. Genitourinary: Negative for dysuria. Musculoskeletal: Negative lower extremity swelling Skin: Negative for rash. Neurological: Negative for severe headaches, focal weakness or numbness. 10-point ROS otherwise negative.  ____________________________________________   PHYSICAL EXAM:  VITAL SIGNS: ED Triage Vitals  Enc Vitals Group     BP 12/02/15 1432 122/76     Pulse Rate 12/02/15 1432 99     Resp 12/02/15 1432 20     Temp 12/02/15 1432 98.2 F (36.8 C)     Temp Source 12/02/15 1432 Oral     SpO2 12/02/15 1432 98 %     Weight 12/02/15 1432 140 lb (63.5 kg)     Height 12/02/15 1432 5\' 3"  (1.6 m)     Head Circumference --      Peak Flow --      Pain Score 12/02/15 1434 7     Pain Loc --      Pain Edu? --      Excl. in GC? --     Constitutional: Alert and oriented. Well appearing and in no acute distress.Patient with a very difficult to follow history. At his baseline according to family. Eyes: Conjunctivae are normal. PERRL. EOMI. Head: Atraumatic. Nose: No congestion/rhinnorhea. Mouth/Throat: Mucous membranes are moist.  Oropharynx non-erythematous. Neck: No stridor.    Nontender with no meningismus Cardiovascular: Normal rate, regular rhythm. Grossly normal heart sounds.  Good peripheral circulation. Respiratory: Normal respiratory effort.  No retractions. Lungs CTAB. Abdominal: Soft and nontender. No distention. No guarding no rebound Back:  There is no focal tenderness or step off.  there is no midline tenderness there are no lesions noted. there is no CVA tenderness Musculoskeletal: No lower extremity tenderness, no upper extremity tenderness. No joint effusions, no DVT signs strong distal pulses no edema Neurologic:  Normal speech and language. No gross focal neurologic deficits are appreciated.  Skin:  Skin is warm, dry and intact. No  rash noted. Psychiatric: Mood and affect are normal. Speech and behavior are normal.  ____________________________________________   LABS (all labs ordered are listed, but only abnormal results are displayed)  Labs Reviewed  BASIC METABOLIC PANEL - Abnormal; Notable for the following:       Result Value   Glucose, Bld 124 (*)    All other components within normal limits  CBC  TROPONIN I   ____________________________________________  EKG  I personally interpreted any EKGs ordered by me or triage Normal sinus tachycardia rate 101 no acute ST elevation or depression normal axis no acute ischemia ____________________________________________  RADIOLOGY  I reviewed any imaging ordered by me or triage that were performed during my shift and, if possible, patient and/or family made aware of any abnormal findings. ____________________________________________   PROCEDURES  Procedure(s) performed: None  Procedures  Critical Care performed: None  ____________________________________________   INITIAL IMPRESSION / ASSESSMENT AND PLAN / ED COURSE  Pertinent labs & imaging results that were available during my care of the patient were reviewed by me and considered in my medical decision making (see chart for  details).  Patient here with a host of different complaints. At one point he did say that he had chest pain to someone although he denies it to me. He states that he wants to make sure he does not have cancer some work as he thinks he should weigh more than he does. Patient is well-appearing with normal exam at this time. There is no evidence of murmur or endocarditis his abdomen is benign heart lungs her negative troponin and EKG and blood work reassuring chest x-ray reassuring, at this time I do not see any emergent pathology requiring admission to the hospital At this time, there does not appear to be clinical evidence to support the diagnosis of pulmonary embolus, dissection, myocarditis, endocarditis, pericarditis, pericardial tamponade, acute coronary syndrome, pneumothorax, pneumonia, or any other acute intrathoracic pathology that will require admission or acute intervention. Nor is there evidence of any significant intra-abdominal pathology causing this discomfort. Patient is not interested in drug therapy or rehabilitation. We will discharge him with close outpatient follow-up with a primary care doctor. Return precautions and follow-up given and understood.  Clinical Course    ____________________________________________   FINAL CLINICAL IMPRESSION(S) / ED DIAGNOSES  Final diagnoses:  None      This chart was dictated using voice recognition software.  Despite best efforts to proofread,  errors can occur which can change meaning.      Jeanmarie PlantJames A McShane, MD 12/02/15 1827    Jeanmarie PlantJames A McShane, MD 12/02/15 [phone]

## 2015-12-02 NOTE — ED Triage Notes (Addendum)
Patient presents to the ED with intermittent left arm pain and chest pain x 6 months.  Patient states he has been clean for 8 months but used cocaine last night.  Patient states, "I take suboxone to stay clean.  I have a friend that gets it for me.  I need some help."  Patient states, "I'm one of the worst dope addicts you will ever meet.  I've used heroine and cocaine for a long time."  Patient reports feeling anxiety at this time.  Patient states, "I stopped taking seroquel because they said that's what was causing this chest pain."

## 2015-12-02 NOTE — ED Notes (Signed)
Pt states hx of cocaine abuse, hx of cardiac arrest (unsure of when that happened). Pt states CP x 1 year intermittent. Hx hep C. Denies N&V, diarrhea, fever. States SOB, does not appear SOB at current moment, talking in complete sentences, alert and oriented x 4. Pt rambling about issues that he is not here for today.   Pt states what brings him in today is "wanting to find out what's wrong with me." Does not give more information than this, nothing more specific than that statement. Says he has "feelings of something wrong" and states it scares him. "I'm tired of waiting around." States he just had AIDS test done and he doesn't have that. "A clot wouldn't surprise me."   Pt calls himself a junky to this Char[employer] fundraiserN. Pt is talking about high he gets when he shoots up. Last cocaine use yesterday, states was clean for 8 months. States occasional ETOH use, "I'm not a big drinker but my liver probably isn't looking to good because of my hep C." "I've shot dope since I was in my teens."   Denies SI, denies HI.

## 2015-12-02 NOTE — ED Notes (Addendum)
Pt taken to xray via stretcher  

## 2015-12-25 ENCOUNTER — Encounter (HOSPITAL_COMMUNITY): Payer: Self-pay | Admitting: Emergency Medicine

## 2015-12-25 ENCOUNTER — Emergency Department (HOSPITAL_COMMUNITY)
Admission: EM | Admit: 2015-12-25 | Discharge: 2015-12-25 | Disposition: A | Discharge status: Home / Self Care | Payer: Self-pay | Attending: Emergency Medicine | Admitting: Emergency Medicine

## 2015-12-25 DIAGNOSIS — F17210 Nicotine dependence, cigarettes, uncomplicated: Secondary | ICD-10-CM | POA: Insufficient documentation

## 2015-12-25 DIAGNOSIS — F909 Attention-deficit hyperactivity disorder, unspecified type: Secondary | ICD-10-CM | POA: Insufficient documentation

## 2015-12-25 DIAGNOSIS — Z79899 Other long term (current) drug therapy: Secondary | ICD-10-CM | POA: Insufficient documentation

## 2015-12-25 DIAGNOSIS — K0889 Other specified disorders of teeth and supporting structures: Secondary | ICD-10-CM | POA: Insufficient documentation

## 2015-12-25 MED ORDER — OXYCODONE-ACETAMINOPHEN 5-325 MG PO TABS
1.0000 | ORAL_TABLET | Freq: Once | ORAL | Status: AC
  Administered 2015-12-25: 1 via ORAL
  Filled 2015-12-25: qty 1

## 2015-12-25 MED ORDER — PENICILLIN V POTASSIUM 500 MG PO TABS: 500.0000 mg | ORAL_TABLET | Freq: Two times a day (BID) | ORAL | 0 refills | Status: DC

## 2015-12-25 MED ORDER — OXYCODONE-ACETAMINOPHEN 5-325 MG PO TABS: 1.0000 | ORAL_TABLET | ORAL | 0 refills | Status: DC | PRN

## 2015-12-25 MED ORDER — IBUPROFEN 600 MG PO TABS: 600.0000 mg | ORAL_TABLET | Freq: Four times a day (QID) | ORAL | 0 refills | Status: DC | PRN

## 2015-12-25 NOTE — ED Notes (Signed)
See pas notes  She saw before myself  See triage nurses notes

## 2015-12-25 NOTE — ED Provider Notes (Signed)
[facility]-EMERGENCY DEPT Provider Note   CSN: [id number] Arrival date & time: 12/25/15  1448  By signing my name below, I, Sonum Patel, attest that this documentation has been prepared under the direction and in the presence of Melburn Hake, [place]. Electronically Signed: Sonum Patel, Neurosurgeon. 12/25/15. 3:12 PM.  History   Chief Complaint Chief Complaint  Patient presents with  . Dental Pain    The history is provided by the patient. No language interpreter was used.     HPI Comments: Joshua Thomas is a 40 y.o. male who presents to the Emergency Department complaining of 3 days of constant, unchanged right sided upper and lower dental pain. He states his teeth have been in poor condition for the last several months but this pain only began recently. He states swishing with water temporarily alleviates his symptoms. He has taken multiple Goody powders without relief. He denies associated drainage, Fever, facial/neck swelling, trismus, drooling, dysphagia, shortness of breath, vomiting.  Past Medical History:  Diagnosis Date  . ADHD (attention deficit hyperactivity disorder)   . Bipolar 1 disorder (HCC)   . Chronic pain    chronic l leg pain  . DDD (degenerative disc disease), lumbar   . Degenerative disc disease, lumbar   . Depression   . GERD (gastroesophageal reflux disease)   . Hepatitis C   . Hepatitis C   . History of stomach ulcers   . Sciatica     Patient Active Problem List   Diagnosis Date Noted  . Cocaine use disorder, moderate, dependence (HCC) 05/23/2015  . Opioid use disorder, moderate, dependence (HCC) 05/23/2015  . Benzodiazepine abuse in remission 05/23/2015  . Substance or medication-induced bipolar and related disorder with onset during intoxication (HCC) 05/23/2015  . Hepatitis C   . 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 FRACTURE SURGERY    . HARDWARE REMOVAL  01/27/2012   Procedure: HARDWARE REMOVAL;  Surgeon: Kathryne Hitch, MD;  Location: [facility];  Service: Orthopedics;  Laterality: Left;  . TIBIA IM NAIL INSERTION  01/27/2012   Procedure: INTRAMEDULLARY (IM) NAIL TIBIAL;  Surgeon: Kathryne Hitch, MD;  Location: [facility];  Service: Orthopedics;  Laterality: Left;  Removal of IM Rod and Screws Left Tibia with Exchange Nail, Allograft Bone Graft Left Tibia       Home Medications    Prior to Admission medications   Medication Sig Start Date End Date Taking? Authorizing Provider  cephALEXin (KEFLEX) 500 MG capsule Take 1 capsule (500 mg total) by mouth every 8 (eight) hours. For infection 05/23/15   Sanjuana Kava, NP  cyclobenzaprine (FLEXERIL) 10 MG tablet Take 1 tablet (10 mg total) by mouth 2 (two) times daily as needed for muscle spasms. 09/10/15   Hope Orlene Och, NP  DULoxetine (CYMBALTA) 30 MG capsule Take 1 capsule (30 mg total) by mouth daily. For depression 05/23/15   Sanjuana Kava, NP  gabapentin (NEURONTIN) 400 MG capsule Take 1 capsule (400 mg total) by mouth 4 (four) times daily - after meals and at bedtime. For agitation/pain 05/23/15   Sanjuana Kava, NP  hydrOXYzine (ATARAX/VISTARIL) 25 MG tablet Take 1 tablet (25 mg) four times daily: For anxiety 05/23/15   Sanjuana Kava, NP  ibuprofen (ADVIL,MOTRIN) 600 MG tablet Take 1 tablet (600 mg total) by mouth every 6 (six) hours as needed. 12/25/15   Barrett Henle, PA-C  methocarbamol (ROBAXIN) 500  MG tablet Take 1 tablet (500 mg total) by mouth 2 (two) times daily. 09/06/15   Eyvonne MechanicJeffrey Hedges, PA-C  mirtazapine (REMERON) 7.5 MG tablet Take 1 tablet (7.5 mg total) by mouth at bedtime. For sleep 05/23/15   Sanjuana KavaAgnes I Nwoko, NP  nicotine (NICODERM CQ - DOSED IN MG/24 HOURS) 21 mg/24hr patch Place 1 patch (21 mg total) onto the skin daily. For smoking cessation 05/23/15   Sanjuana KavaAgnes I Nwoko, NP  oxyCODONE-acetaminophen (PERCOCET/ROXICET) 5-325 MG tablet Take 1 tablet by mouth every 4 (four) hours as needed  for severe pain. 12/25/15   Barrett HenleNicole Elizabeth Nadeau, PA-C  penicillin v potassium (VEETID) 500 MG tablet Take 1 tablet (500 mg total) by mouth 2 (two) times daily. 12/25/15   Barrett HenleNicole Elizabeth Nadeau, PA-C  traMADol (ULTRAM) 50 MG tablet Take 1 tablet (50 mg total) by mouth every 6 (six) hours as needed. 09/10/15   Hope Orlene OchM Neese, NP    Family History Family History  Problem Relation Age of Onset  . Diabetes Mother     Social History Social History  Substance Use Topics  . Smoking status: Current Every Day Smoker    Packs/day: 1.00    Years: 24.00    Types: Cigarettes  . Smokeless tobacco: Former NeurosurgeonUser    Quit date: 08/08/2006  . Alcohol use Yes     Comment: occasionally liquor     Allergies   Acetaminophen; Morphine and related; Buprenorphine hcl; and Meperidine   Review of Systems Review of Systems  Constitutional: Negative for fever.  HENT: Positive for dental problem.      Physical Exam Updated Vital Signs BP 128/92 (BP Location: Right Arm)   Pulse 93   Temp 98.2 F (36.8 C) (Oral)   Resp 18   SpO2 96%   Physical Exam  Constitutional: He is oriented to person, place, and time. He appears well-developed and well-nourished.  HENT:  Head: Normocephalic and atraumatic.  Mouth/Throat: Oropharynx is clear and moist. Abnormal dentition. Dental caries present. No dental abscesses. No oropharyngeal exudate.  Very poor dentition throughout with multiple extracted teeth and multiple teeth decaying down to gumline bilaterally. Dental caries present. No swelling, erythema, induration, fluctuance, or drainage noted to gingiva throughout. Floor of mouth soft. No facial or neck swelling. No trismus or drooling. Patient tolerating secretions.   Eyes: Conjunctivae and EOM are normal. Right eye exhibits no discharge. Left eye exhibits no discharge. No scleral icterus.  Neck: Normal range of motion. Neck supple.  Cardiovascular: Normal rate and intact distal pulses.   Pulmonary/Chest:  Effort normal.  Neurological: He is alert and oriented to person, place, and time.  Skin: Skin is warm and dry.  Nursing note and vitals reviewed.    ED Treatments / Results  DIAGNOSTIC STUDIES: Oxygen Saturation is 96% on RA, adequate by my interpretation.    COORDINATION OF CARE: 3:09 PM Discussed treatment plan with pt at bedside and pt agreed to plan.   Labs (all labs ordered are listed, but only abnormal results are displayed) Labs Reviewed - No data to display  EKG  EKG Interpretation None       Radiology No results found.  Procedures Procedures (including critical care time)  Medications Ordered in ED Medications  oxyCODONE-acetaminophen (PERCOCET/ROXICET) 5-325 MG per tablet 1 tablet (1 tablet Oral Given 12/25/15 1527)     Initial Impression / Assessment and Plan / ED Course  I have reviewed the triage vital signs and the nursing notes.  Pertinent labs & imaging results  that were available during my care of the patient were reviewed by me and considered in my medical decision making (see chart for details).  Clinical Course     Patient with dentalgia.  No abscess requiring immediate incision and drainage.  Exam not concerning for Ludwig's angina or pharyngeal abscess.  Will treat with Penicillin and pain meds. Pt instructed to follow-up with dentist, patient given outpatient resources.  Discussed return precautions. Pt safe for discharge.   Final Clinical Impressions(s) / ED Diagnoses   Final diagnoses:  Pain, dental    New Prescriptions Discharge Medication List as of 12/25/2015  3:16 PM    START taking these medications   Details  oxyCODONE-acetaminophen (PERCOCET/ROXICET) 5-325 MG tablet Take 1 tablet by mouth every 4 (four) hours as needed for severe pain., Starting Thu 12/25/2015, Print    penicillin v potassium (VEETID) 500 MG tablet Take 1 tablet (500 mg total) by mouth 2 (two) times daily., Starting Thu 12/25/2015, Print       I  personally performed the services described in this documentation, which was scribed in my presence. The recorded information has been reviewed and is accurate.    Satira Sarkicole Elizabeth Hampton BeachNadeau, [place]-C 12/25/15 1539    Nira ConnPedro Eduardo Cardama, MD 12/25/15 2201

## 2015-12-25 NOTE — Discharge Instructions (Signed)
Take medications as prescribed. You may also take 600 mg ibuprofen 4 times daily as needed for pain relief. I recommend eating prior to taking ibuprofen to prevent gastrointestinal side effects. You may apply ice to affected area for 15-20 minutes 3-4 times daily to help with pain. °Follow-up with one of the dental clinics listed below for further management of your dental pain. °Return to the emergency department if symptoms worsen or new onset of fever, headache, neck stiffness, facial/neck swelling, unable to open jaw, unable to swallow resulting in drooling, difficulty breathing, drainage, unable to tolerate fluids.  ° °[employer] °[employer] °[facility] °[address] °[place], [place] [address] °Phone [phone] ° °[facility] [employer] [facility] in [place], [place], exemplifies the Dental School?s vision to improve the health and quality of life of all [place] by creating leaders with a passion to care for the underserved and by leading the nation in community-based, service learning oral health education. ° °We are committed to offering comprehensive general dental services for adults, children and special needs patients in a safe, caring and professional setting. ° ° °Appointments: Our clinic is open Monday through Friday 8:00 a.m. until 5:00 p.m. The amount of time scheduled for an appointment depends on the patient?s specific needs. We ask that you keep your appointed time for care or provide 24-hour notice of all appointment changes. Parents or legal guardians must accompany minor children. °  °Payment for Services: Medicaid and other insurance plans are welcome. Payment for services is due when services are rendered and may be made by cash or credit card. If you have dental insurance, we will assist you with your claim submission. °   °Emergencies:   Emergency services will be provided Monday through Friday on a walk-in basis.  Please arrive early for emergency services. After hours emergency services will be provided for patients of record as required. °  °Services:  °Comprehensive General Dentistry °Children?s Dentistry °Oral Surgery - Extractions °Root Canals °Sealants and Tooth Colored Fillings °Crowns and Bridges °Dentures and Partial Dentures °Implant Services °Periodontal Services and Cleanings °Cosmetic Tooth Whitening °Digital Radiography °3-D/[facility]  °

## 2015-12-25 NOTE — ED Triage Notes (Signed)
Pt sts dental pain x 4 days

## 2016-01-18 ENCOUNTER — Emergency Department: Payer: Self-pay

## 2016-01-18 ENCOUNTER — Emergency Department
Admission: EM | Admit: 2016-01-18 | Discharge: 2016-01-18 | Disposition: A | Discharge status: Home / Self Care | Payer: Self-pay | Attending: Emergency Medicine | Admitting: Emergency Medicine

## 2016-01-18 DIAGNOSIS — S39012A Strain of muscle, fascia and tendon of lower back, initial encounter: Secondary | ICD-10-CM | POA: Insufficient documentation

## 2016-01-18 DIAGNOSIS — F17210 Nicotine dependence, cigarettes, uncomplicated: Secondary | ICD-10-CM | POA: Insufficient documentation

## 2016-01-18 DIAGNOSIS — Y939 Activity, unspecified: Secondary | ICD-10-CM | POA: Insufficient documentation

## 2016-01-18 DIAGNOSIS — Z791 Long term (current) use of non-steroidal anti-inflammatories (NSAID): Secondary | ICD-10-CM | POA: Insufficient documentation

## 2016-01-18 DIAGNOSIS — F909 Attention-deficit hyperactivity disorder, unspecified type: Secondary | ICD-10-CM | POA: Insufficient documentation

## 2016-01-18 DIAGNOSIS — Z79899 Other long term (current) drug therapy: Secondary | ICD-10-CM | POA: Insufficient documentation

## 2016-01-18 DIAGNOSIS — W11XXXA Fall on and from ladder, initial encounter: Secondary | ICD-10-CM | POA: Insufficient documentation

## 2016-01-18 DIAGNOSIS — Y999 Unspecified external cause status: Secondary | ICD-10-CM | POA: Insufficient documentation

## 2016-01-18 DIAGNOSIS — Y929 Unspecified place or not applicable: Secondary | ICD-10-CM | POA: Insufficient documentation

## 2016-01-18 MED ORDER — KETOROLAC TROMETHAMINE 30 MG/ML IJ SOLN
30.0000 mg | Freq: Once | INTRAMUSCULAR | Status: AC
  Administered 2016-01-18: 30 mg via INTRAMUSCULAR

## 2016-01-18 MED ORDER — KETOROLAC TROMETHAMINE 30 MG/ML IJ SOLN
INTRAMUSCULAR | Status: AC
  Filled 2016-01-18: qty 1

## 2016-01-18 MED ORDER — NAPROXEN 500 MG PO TABS: 500.0000 mg | ORAL_TABLET | Freq: Two times a day (BID) | ORAL | 2 refills | Status: DC

## 2016-01-18 NOTE — ED Provider Notes (Signed)
[facility] Emergency Department Provider Note   ____________________________________________    I have reviewed the triage vital signs and the nursing notes.   HISTORY  Chief Complaint Back Pain     HPI Joshua Thomas is a 41 y.o. male who presents one day after falling off of a ladder with back pain. Patient reports he fell off the fifth rung of a ladder because it was slippery onto his back. He complains of pain in his lower back today which is moderate to severe and worse with twisting or bending. He denies numbness or tingling to his lower shoulders. No difficulty urinating. No abdominal pain. No other injuries reported. No neck pain.   Past Medical History:  Diagnosis Date  . ADHD (attention deficit hyperactivity disorder)   . Bipolar 1 disorder (HCC)   . Chronic pain    chronic l leg pain  . DDD (degenerative disc disease), lumbar   . Degenerative disc disease, lumbar   . Depression   . GERD (gastroesophageal reflux disease)   . Hepatitis C   . Hepatitis C   . History of stomach ulcers   . Sciatica     Patient Active Problem List   Diagnosis Date Noted  . Cocaine use disorder, moderate, dependence (HCC) 05/23/2015  . Opioid use disorder, moderate, dependence (HCC) 05/23/2015  . Benzodiazepine abuse in remission 05/23/2015  . Substance or medication-induced bipolar and related disorder with onset during intoxication (HCC) 05/23/2015  . Hepatitis C   . 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 FRACTURE SURGERY    . HARDWARE REMOVAL  01/27/2012   Procedure: HARDWARE REMOVAL;  Surgeon: Kathryne Hitchhristopher Y Blackman, MD;  Location: [facility];  Service: Orthopedics;  Laterality: Left;  . TIBIA IM NAIL INSERTION  01/27/2012   Procedure: INTRAMEDULLARY (IM) NAIL TIBIAL;  Surgeon: Kathryne Hitchhristopher Y Blackman, MD;  Location: [facility];  Service: Orthopedics;  Laterality: Left;   Removal of IM Rod and Screws Left Tibia with Exchange Nail, Allograft Bone Graft Left Tibia    Prior to Admission medications   Medication Sig Start Date End Date Taking? Authorizing Provider  cephALEXin (KEFLEX) 500 MG capsule Take 1 capsule (500 mg total) by mouth every 8 (eight) hours. For infection 05/23/15   Sanjuana KavaAgnes I Nwoko, NP  cyclobenzaprine (FLEXERIL) 10 MG tablet Take 1 tablet (10 mg total) by mouth 2 (two) times daily as needed for muscle spasms. 09/10/15   Hope Orlene OchM Neese, NP  DULoxetine (CYMBALTA) 30 MG capsule Take 1 capsule (30 mg total) by mouth daily. For depression 05/23/15   Sanjuana KavaAgnes I Nwoko, NP  gabapentin (NEURONTIN) 400 MG capsule Take 1 capsule (400 mg total) by mouth 4 (four) times daily - after meals and at bedtime. For agitation/pain 05/23/15   Sanjuana KavaAgnes I Nwoko, NP  hydrOXYzine (ATARAX/VISTARIL) 25 MG tablet Take 1 tablet (25 mg) four times daily: For anxiety 05/23/15   Sanjuana KavaAgnes I Nwoko, NP  ibuprofen (ADVIL,MOTRIN) 600 MG tablet Take 1 tablet (600 mg total) by mouth every 6 (six) hours as needed. 12/25/15   Barrett HenleNicole Elizabeth Nadeau, PA-C  methocarbamol (ROBAXIN) 500 MG tablet Take 1 tablet (500 mg total) by mouth 2 (two) times daily. 09/06/15   Eyvonne MechanicJeffrey Hedges, PA-C  mirtazapine (REMERON) 7.5 MG tablet Take 1 tablet (7.5 mg total) by mouth at bedtime. For sleep 05/23/15   Sanjuana KavaAgnes I Nwoko, NP  nicotine (NICODERM CQ - DOSED  IN MG/24 HOURS) 21 mg/24hr patch Place 1 patch (21 mg total) onto the skin daily. For smoking cessation 05/23/15   Sanjuana Kava, NP  oxyCODONE-acetaminophen (PERCOCET/ROXICET) 5-325 MG tablet Take 1 tablet by mouth every 4 (four) hours as needed for severe pain. 12/25/15   Barrett Henle, PA-C  penicillin v potassium (VEETID) 500 MG tablet Take 1 tablet (500 mg total) by mouth 2 (two) times daily. 12/25/15   Barrett Henle, PA-C  traMADol (ULTRAM) 50 MG tablet Take 1 tablet (50 mg total) by mouth every 6 (six) hours as needed. 09/10/15   Hope Orlene Och, NP      Allergies Acetaminophen; Morphine and related; Buprenorphine hcl; and Meperidine  Family History  Problem Relation Age of Onset  . Diabetes Mother     Social History Social History  Substance Use Topics  . Smoking status: Current Every Day Smoker    Packs/day: 1.00    Years: 24.00    Types: Cigarettes  . Smokeless tobacco: Former Neurosurgeon    Quit date: 08/08/2006  . Alcohol use Yes     Comment: occasionally liquor    Review of Systems  Constitutional: No Dizziness  ENT: No neck pain Gastrointestinal: No abdominal pain.  No nausea, no vomiting.   Genitourinary: Negative for incontinence Musculoskeletal: As above Skin: Negative for rash. Neurological: Negative for headaches , no focal deficits    ____________________________________________   PHYSICAL EXAM:  VITAL SIGNS: ED Triage Vitals  Enc Vitals Group     BP 01/18/16 1808 (!) 132/95     Pulse Rate 01/18/16 1808 88     Resp 01/18/16 1808 16     Temp 01/18/16 1808 98 F (36.7 C)     Temp Source 01/18/16 1808 Oral     SpO2 01/18/16 1808 100 %     Weight 01/18/16 1809 160 lb (72.6 kg)     Height 01/18/16 1809 5\' 5"  (1.651 m)     Head Circumference --      Peak Flow --      Pain Score 01/18/16 1809 8     Pain Loc --      Pain Edu? --      Excl. in GC? --     Constitutional: Alert and oriented. No acute distress.   Head: Atraumatic. Nose: No congestion/rhinnorhea. Mouth/Throat: Mucous membranes are moist.   Cardiovascular: Normal rate, regular rhythm.  Respiratory: Normal respiratory effort.  No retractions. Genitourinary: deferred Musculoskeletal: Full range of motion of all extremities. No vertebral tenderness to palpation. Mild left lumbar paraspinal muscle tenderness to palpation. Neurologic:  Normal speech and language. No gross focal neurologic deficits are appreciated.  Normal sensation, normal strength in the lower extremities Skin:  Skin is warm, dry and intact. No rash  noted.   ____________________________________________   LABS (all labs ordered are listed, but only abnormal results are displayed)  Labs Reviewed - No data to display ____________________________________________  EKG   ____________________________________________  RADIOLOGY  Lumbar spine x-ray no acute distress ____________________________________________   PROCEDURES  Procedure(s) performed: No    Critical Care performed: No ____________________________________________   INITIAL IMPRESSION / ASSESSMENT AND PLAN / ED COURSE  Pertinent labs & imaging results that were available during my care of the patient were reviewed by me and considered in my medical decision making (see chart for details).  On review of records patient has frequent visits for various complaints of back pain or dental pain, he apparently has a history of narcotic abuse  as well. We will treat with IM Toradol and check x-rays although I suspect lumbar strain as the cause of his pain.  X-ray unremarkable, we'll treat with NSAIDs PCP follow-up. Return precautions discussed ____________________________________________   FINAL CLINICAL IMPRESSION(S) / ED DIAGNOSES  Final diagnoses:  Strain of lumbar region, initial encounter      NEW MEDICATIONS STARTED DURING THIS VISIT:  New Prescriptions   No medications on file     Note:  This document was prepared using Dragon voice recognition software and may include unintentional dictation errors.    Jene Every, MD 01/18/16 2017

## 2016-01-18 NOTE — ED Triage Notes (Signed)
Pt was on a ladder and the ladder had ice on it causing the pt to fall off the ladder (5-6 feet) - he reports that he pulled a back muscle - incident occurred yesterday

## 2016-01-26 ENCOUNTER — Emergency Department
Admission: EM | Admit: 2016-01-26 | Discharge: 2016-01-26 | Disposition: A | Discharge status: Home / Self Care | Payer: Self-pay | Attending: Emergency Medicine | Admitting: Emergency Medicine

## 2016-01-26 ENCOUNTER — Encounter: Payer: Self-pay | Admitting: *Deleted

## 2016-01-26 DIAGNOSIS — F17210 Nicotine dependence, cigarettes, uncomplicated: Secondary | ICD-10-CM | POA: Insufficient documentation

## 2016-01-26 DIAGNOSIS — M545 Low back pain, unspecified: Secondary | ICD-10-CM

## 2016-01-26 DIAGNOSIS — G8929 Other chronic pain: Secondary | ICD-10-CM | POA: Insufficient documentation

## 2016-01-26 MED ORDER — CYCLOBENZAPRINE HCL 5 MG PO TABS: 5.0000 mg | ORAL_TABLET | Freq: Three times a day (TID) | ORAL | 0 refills | Status: DC | PRN

## 2016-01-26 MED ORDER — ORPHENADRINE CITRATE 30 MG/ML IJ SOLN
60.0000 mg | INTRAMUSCULAR | Status: AC
  Administered 2016-01-26: 60 mg via INTRAMUSCULAR
  Filled 2016-01-26: qty 2

## 2016-01-26 MED ORDER — KETOROLAC TROMETHAMINE 60 MG/2ML IM SOLN
30.0000 mg | Freq: Once | INTRAMUSCULAR | Status: AC
  Administered 2016-01-26: 30 mg via INTRAMUSCULAR
  Filled 2016-01-26: qty 2

## 2016-01-26 NOTE — ED Triage Notes (Signed)
Arrives with complaints of back pain, hx of chronic back pain

## 2016-01-26 NOTE — ED Provider Notes (Signed)
[facility] Emergency Department Provider Note ____________________________________________  Time seen: 1600  I have reviewed the triage vital signs and the nursing notes.  HISTORY  Chief Complaint  Back Pain  HPI Joshua Thomas is a 41 y.o. male presents to the EDfor evaluation of a flare of his chronic back pain. He denies any recent injury, other than a fall from a ladder, 4-rungs up, last week. His x-rays were normal and he was discharged with a prescription for Naproxen. He returns today noting increased pain. He denies any incontinence, foot drop, or distal paresthesias.    Past Medical History:  Diagnosis Date  . ADHD (attention deficit hyperactivity disorder)   . Bipolar 1 disorder (HCC)   . Chronic pain    chronic l leg pain  . DDD (degenerative disc disease), lumbar   . Degenerative disc disease, lumbar   . Depression   . GERD (gastroesophageal reflux disease)   . Hepatitis C   . Hepatitis C   . History of stomach ulcers   . Sciatica     Patient Active Problem List   Diagnosis Date Noted  . Cocaine use disorder, moderate, dependence (HCC) 05/23/2015  . Opioid use disorder, moderate, dependence (HCC) 05/23/2015  . Benzodiazepine abuse in remission 05/23/2015  . Substance or medication-induced bipolar and related disorder with onset during intoxication (HCC) 05/23/2015  . Hepatitis C   . 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 FRACTURE SURGERY    . HARDWARE REMOVAL  01/27/2012   Procedure: HARDWARE REMOVAL;  Surgeon: Kathryne Hitch, MD;  Location: [facility];  Service: Orthopedics;  Laterality: Left;  . TIBIA IM NAIL INSERTION  01/27/2012   Procedure: INTRAMEDULLARY (IM) NAIL TIBIAL;  Surgeon: Kathryne Hitch, MD;  Location: [facility];  Service: Orthopedics;  Laterality: Left;  Removal of IM Rod and Screws Left Tibia with Exchange Nail, Allograft Bone  Graft Left Tibia    Prior to Admission medications   Medication Sig Start Date End Date Taking? Authorizing Provider  cyclobenzaprine (FLEXERIL) 5 MG tablet Take 1 tablet (5 mg total) by mouth 3 (three) times daily as needed for muscle spasms. 01/26/16   Chrisanne Loose V Bacon Tonianne Fine, PA-C  DULoxetine (CYMBALTA) 30 MG capsule Take 1 capsule (30 mg total) by mouth daily. For depression 05/23/15   Sanjuana Kava, NP  gabapentin (NEURONTIN) 400 MG capsule Take 1 capsule (400 mg total) by mouth 4 (four) times daily - after meals and at bedtime. For agitation/pain 05/23/15   Sanjuana Kava, NP  hydrOXYzine (ATARAX/VISTARIL) 25 MG tablet Take 1 tablet (25 mg) four times daily: For anxiety 05/23/15   Sanjuana Kava, NP  methocarbamol (ROBAXIN) 500 MG tablet Take 1 tablet (500 mg total) by mouth 2 (two) times daily. 09/06/15   Eyvonne Mechanic, PA-C  mirtazapine (REMERON) 7.5 MG tablet Take 1 tablet (7.5 mg total) by mouth at bedtime. For sleep 05/23/15   Sanjuana Kava, NP  naproxen (NAPROSYN) 500 MG tablet Take 1 tablet (500 mg total) by mouth 2 (two) times daily with a meal. 01/18/16   Jene Every, MD  nicotine (NICODERM CQ - DOSED IN MG/24 HOURS) 21 mg/24hr patch Place 1 patch (21 mg total) onto the skin daily. For smoking cessation 05/23/15   Sanjuana Kava, NP  oxyCODONE-acetaminophen (PERCOCET/ROXICET) 5-325 MG tablet Take 1 tablet by mouth every 4 (four) hours as needed for severe pain.  12/25/15   Barrett HenleNicole Elizabeth Nadeau, PA-C  penicillin v potassium (VEETID) 500 MG tablet Take 1 tablet (500 mg total) by mouth 2 (two) times daily. 12/25/15   Barrett HenleNicole Elizabeth Nadeau, PA-C  traMADol (ULTRAM) 50 MG tablet Take 1 tablet (50 mg total) by mouth every 6 (six) hours as needed. 09/10/15   Hope Orlene OchM Neese, NP    Allergies Acetaminophen; Morphine and related; Buprenorphine hcl; and Meperidine  Family History  Problem Relation Age of Onset  . Diabetes Mother     Social History Social History  Substance Use Topics  . Smoking  status: Current Every Day Smoker    Packs/day: 1.00    Years: 24.00    Types: Cigarettes  . Smokeless tobacco: Former NeurosurgeonUser    Quit date: 08/08/2006  . Alcohol use Yes     Comment: occasionally liquor    Review of Systems  Constitutional: Negative for fever. Cardiovascular: Negative for chest pain. Respiratory: Negative for shortness of breath. Gastrointestinal: Negative for abdominal pain, vomiting and diarrhea. Genitourinary: Negative for dysuria. Musculoskeletal: Positive for back pain. Neurological: Negative for headaches, focal weakness or numbness. ____________________________________________  PHYSICAL EXAM:  VITAL SIGNS: ED Triage Vitals  Enc Vitals Group     BP 01/26/16 1516 (!) 163/85     Pulse Rate 01/26/16 1516 98     Resp 01/26/16 1516 18     Temp 01/26/16 1516 98 F (36.7 C)     Temp Source 01/26/16 1516 Oral     SpO2 01/26/16 1516 99 %     Weight 01/26/16 1514 160 lb (72.6 kg)     Height 01/26/16 1514 5\' 5"  (1.651 m)     Head Circumference --      Peak Flow --      Pain Score 01/26/16 1514 10     Pain Loc --      Pain Edu? --      Excl. in GC? --    Constitutional: Alert and oriented. Well appearing and in no distress. Head: Normocephalic and atraumatic. Cardiovascular: Normal rate, regular rhythm. Normal distal pulses. Respiratory: Normal respiratory effort. No wheezes/rales/rhonchi. Gastrointestinal: Soft and nontender. No distention. Musculoskeletal: Normal spinal alignment without midline tenderness, spasm, deformity, or step-off. Patient transitions from supine to sit without difficulty. Negative supine SLR. Nontender with normal range of motion in all extremities.  Neurologic:  CN II-XII grossly intact. Normal LE DTRs bilaterally. Normal toe extension, foot eversion, and plantarflexion. Mildly antalgic gait without ataxia. Normal speech and language. No gross focal neurologic deficits are appreciated. Psychiatric: Mood and affect are normal. Patient  exhibits appropriate insight and judgment. ____________________________________________  PROCEDURES  Toradol 30 mg IM Norflex 60 mg IM ____________________________________________  INITIAL IMPRESSION / ASSESSMENT AND PLAN / ED COURSE  In with an acute flare of his chronic low back pain without recent injury. His exam is otherwise benign without any acute neuromuscular deficit. He'll be discharged with a prescription for muscle relaxant dose in addition to the previously prescribed naproxen, which has 2 refills available. Patient is referred to neurosurgery for further management.   Clinical Course    ____________________________________________  FINAL CLINICAL IMPRESSION(S) / ED DIAGNOSES  Final diagnoses:  Chronic midline low back pain without sciatica  Acute exacerbation of chronic low back pain      Lissa HoardJenise V Bacon Sevin Farone, PA-C 01/26/16 1653    Minna AntisKevin Paduchowski, MD 01/26/16 [phone]

## 2016-01-26 NOTE — Discharge Instructions (Signed)
Your exam is negative for any serious injury at this time. Take the prescription muscle relaxant as directed along with the previously prescribed Naproxen. Follow-up with Neurosurgery for further management.

## 2016-02-02 ENCOUNTER — Emergency Department (HOSPITAL_COMMUNITY): Payer: Self-pay

## 2016-02-02 ENCOUNTER — Encounter (HOSPITAL_COMMUNITY): Payer: Self-pay

## 2016-02-02 ENCOUNTER — Emergency Department (HOSPITAL_COMMUNITY)
Admission: EM | Admit: 2016-02-02 | Discharge: 2016-02-02 | Disposition: A | Discharge status: Home / Self Care | Payer: Self-pay | Attending: Emergency Medicine | Admitting: Emergency Medicine

## 2016-02-02 DIAGNOSIS — F909 Attention-deficit hyperactivity disorder, unspecified type: Secondary | ICD-10-CM | POA: Insufficient documentation

## 2016-02-02 DIAGNOSIS — F17210 Nicotine dependence, cigarettes, uncomplicated: Secondary | ICD-10-CM | POA: Insufficient documentation

## 2016-02-02 DIAGNOSIS — R109 Unspecified abdominal pain: Secondary | ICD-10-CM | POA: Insufficient documentation

## 2016-02-02 DIAGNOSIS — M545 Low back pain: Secondary | ICD-10-CM | POA: Insufficient documentation

## 2016-02-02 DIAGNOSIS — G8929 Other chronic pain: Secondary | ICD-10-CM

## 2016-02-02 LAB — URINALYSIS, ROUTINE W REFLEX MICROSCOPIC
BACTERIA UA: NONE SEEN
BILIRUBIN URINE: NEGATIVE
Glucose, UA: NEGATIVE mg/dL
KETONES UR: NEGATIVE mg/dL
LEUKOCYTES UA: NEGATIVE
NITRITE: NEGATIVE
PH: 7 (ref 5.0–8.0)
Protein, ur: NEGATIVE mg/dL
SPECIFIC GRAVITY, URINE: 1.002 — AB (ref 1.005–1.030)
Squamous Epithelial / LPF: NONE SEEN

## 2016-02-02 MED ORDER — HYDROMORPHONE HCL 2 MG/ML IJ SOLN
1.0000 mg | INTRAMUSCULAR | Status: DC | PRN
  Administered 2016-02-02: 1 mg via INTRAVENOUS
  Filled 2016-02-02: qty 1

## 2016-02-02 MED ORDER — TAMSULOSIN HCL 0.4 MG PO CAPS
0.4000 mg | ORAL_CAPSULE | Freq: Once | ORAL | Status: AC
  Administered 2016-02-02: 0.4 mg via ORAL
  Filled 2016-02-02: qty 1

## 2016-02-02 MED ORDER — KETOROLAC TROMETHAMINE 30 MG/ML IJ SOLN
30.0000 mg | Freq: Once | INTRAMUSCULAR | Status: AC
  Administered 2016-02-02: 30 mg via INTRAVENOUS
  Filled 2016-02-02: qty 1

## 2016-02-02 MED ORDER — ONDANSETRON HCL 4 MG/2ML IJ SOLN
4.0000 mg | Freq: Once | INTRAMUSCULAR | Status: AC
  Administered 2016-02-02: 4 mg via INTRAVENOUS
  Filled 2016-02-02: qty 2

## 2016-02-02 MED ORDER — NAPROXEN 500 MG PO TABS: 500.0000 mg | ORAL_TABLET | Freq: Two times a day (BID) | ORAL | 0 refills | Status: DC

## 2016-02-02 MED ORDER — METHOCARBAMOL 500 MG PO TABS: 500.0000 mg | ORAL_TABLET | Freq: Three times a day (TID) | ORAL | 0 refills | Status: DC | PRN

## 2016-02-02 MED ORDER — METHOCARBAMOL 500 MG PO TABS
1000.0000 mg | ORAL_TABLET | Freq: Once | ORAL | Status: AC
  Administered 2016-02-02: 1000 mg via ORAL
  Filled 2016-02-02: qty 2

## 2016-02-02 NOTE — ED Notes (Signed)
Patient transported to CT 

## 2016-02-02 NOTE — ED Triage Notes (Signed)
Pt reports intermittent left side flank pain. Hx of kidney stones requiring lithotripsy. Pt appears very uncomfortable.

## 2016-02-02 NOTE — Discharge Instructions (Addendum)
Recheck with your primary care physician, or community health center  for ongoing symptoms.

## 2016-02-02 NOTE — ED Provider Notes (Addendum)
[facility]-EMERGENCY DEPT Provider Note   CSN: [id number] Arrival date & time: 02/02/16  1115  By signing my name below, I, Joshua Thomas, attest that this documentation has been prepared under the direction and in the presence of Joshua Porter, MD . Electronically Signed: Freida Thomas, Scribe. 02/02/2016. 1:52 PM.  History   Chief Complaint Chief Complaint  Patient presents with  . Flank Pain   The history is provided by the patient. No language interpreter was used.    HPI Comments:  Joshua Thomas is a 41 y.o. male who presents to the Emergency Department complaining of worsening, left flank pain x 1.5 weeks. He notes radiation of pain across his back and into his abdomen. He has a h/o kidney stones, notes pain today is similar. Pt also has a h/o lithotripsy 2 years ago. He reports associated testicular pain. No alleviating factors noted.   Past Medical History:  Diagnosis Date  . ADHD (attention deficit hyperactivity disorder)   . Bipolar 1 disorder (HCC)   . Chronic pain    chronic l leg pain  . DDD (degenerative disc disease), lumbar   . Degenerative disc disease, lumbar   . Depression   . GERD (gastroesophageal reflux disease)   . Hepatitis C   . Hepatitis C   . History of stomach ulcers   . Sciatica     Patient Active Problem List   Diagnosis Date Noted  . Cocaine use disorder, moderate, dependence (HCC) 05/23/2015  . Opioid use disorder, moderate, dependence (HCC) 05/23/2015  . Benzodiazepine abuse in remission 05/23/2015  . Substance or medication-induced bipolar and related disorder with onset during intoxication (HCC) 05/23/2015  . Hepatitis C   . 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 FRACTURE SURGERY    . HARDWARE REMOVAL  01/27/2012   Procedure: HARDWARE REMOVAL;  Surgeon: Kathryne Hitch, MD;  Location: [facility];  Service: Orthopedics;  Laterality: Left;  . TIBIA IM NAIL  INSERTION  01/27/2012   Procedure: INTRAMEDULLARY (IM) NAIL TIBIAL;  Surgeon: Kathryne Hitch, MD;  Location: [facility];  Service: Orthopedics;  Laterality: Left;  Removal of IM Rod and Screws Left Tibia with Exchange Nail, Allograft Bone Graft Left Tibia       Home Medications    Prior to Admission medications   Medication Sig Start Date End Date Taking? Authorizing Provider  cyclobenzaprine (FLEXERIL) 5 MG tablet Take 1 tablet (5 mg total) by mouth 3 (three) times daily as needed for muscle spasms. 01/26/16   Jenise V Bacon Menshew, PA-C  DULoxetine (CYMBALTA) 30 MG capsule Take 1 capsule (30 mg total) by mouth daily. For depression 05/23/15   Sanjuana Kava, NP  gabapentin (NEURONTIN) 400 MG capsule Take 1 capsule (400 mg total) by mouth 4 (four) times daily - after meals and at bedtime. For agitation/pain 05/23/15   Sanjuana Kava, NP  hydrOXYzine (ATARAX/VISTARIL) 25 MG tablet Take 1 tablet (25 mg) four times daily: For anxiety 05/23/15   Sanjuana Kava, NP  methocarbamol (ROBAXIN) 500 MG tablet Take 1 tablet (500 mg total) by mouth 3 (three) times daily between meals as needed. 02/02/16   Joshua Porter, MD  mirtazapine (REMERON) 7.5 MG tablet Take 1 tablet (7.5 mg total) by mouth at bedtime. For sleep 05/23/15   Sanjuana Kava, NP  naproxen (NAPROSYN) 500 MG tablet Take 1 tablet (500 mg total) by mouth 2 (two) times  daily. 02/02/16   Joshua PorterMark Sumayyah Custodio, MD  nicotine (NICODERM CQ - DOSED IN MG/24 HOURS) 21 mg/24hr patch Place 1 patch (21 mg total) onto the skin daily. For smoking cessation 05/23/15   Sanjuana KavaAgnes I Nwoko, NP  oxyCODONE-acetaminophen (PERCOCET/ROXICET) 5-325 MG tablet Take 1 tablet by mouth every 4 (four) hours as needed for severe pain. 12/25/15   Barrett HenleNicole Elizabeth Nadeau, PA-C  penicillin v potassium (VEETID) 500 MG tablet Take 1 tablet (500 mg total) by mouth 2 (two) times daily. 12/25/15   Barrett HenleNicole Elizabeth Nadeau, PA-C  traMADol (ULTRAM) 50 MG tablet Take 1 tablet (50 mg total) by mouth every 6  (six) hours as needed. 09/10/15   Hope Orlene OchM Neese, NP    Family History Family History  Problem Relation Age of Onset  . Diabetes Mother     Social History Social History  Substance Use Topics  . Smoking status: Current Every Day Smoker    Packs/day: 1.00    Years: 24.00    Types: Cigarettes  . Smokeless tobacco: Former NeurosurgeonUser    Quit date: 08/08/2006  . Alcohol use Yes     Comment: occasionally liquor     Allergies   Acetaminophen; Morphine and related; Buprenorphine hcl; and Meperidine   Review of Systems Review of Systems  Constitutional: Negative for appetite change, chills, diaphoresis, fatigue and fever.  HENT: Negative for mouth sores, sore throat and trouble swallowing.   Eyes: Negative for visual disturbance.  Respiratory: Negative for cough, chest tightness, shortness of breath and wheezing.   Cardiovascular: Negative for chest pain.  Gastrointestinal: Positive for abdominal pain. Negative for abdominal distention, diarrhea, nausea and vomiting.  Endocrine: Negative for polydipsia, polyphagia and polyuria.  Genitourinary: Positive for flank pain and testicular pain. Negative for dysuria, frequency and hematuria.  Musculoskeletal: Positive for back pain. Negative for gait problem.  Skin: Negative for color change, pallor and rash.  Neurological: Negative for dizziness, syncope, light-headedness and headaches.  Hematological: Does not bruise/bleed easily.  Psychiatric/Behavioral: Negative for behavioral problems and confusion.     Physical Exam Updated Vital Signs BP (!) 124/110 (BP Location: Left Arm)   Pulse 90   Temp 98.3 F (36.8 C) (Oral)   Resp 16   SpO2 100%   Physical Exam  Constitutional: He is oriented to person, place, and time. He appears well-developed and well-nourished.  Writhing and anxious  HENT:  Head: Normocephalic.  Eyes: Conjunctivae are normal. Pupils are equal, round, and reactive to light. No scleral icterus.  Neck: Normal range of  motion. Neck supple. No thyromegaly present.  Cardiovascular: Normal rate and regular rhythm.  Exam reveals no gallop and no friction rub.   No murmur heard. Pulmonary/Chest: Effort normal and breath sounds normal. No respiratory distress. He has no wheezes. He has no rales.  Abdominal: Soft. Bowel sounds are normal. He exhibits no distension. There is no rebound.  Left flank tenderness  Musculoskeletal: Normal range of motion.  Neurological: He is alert and oriented to person, place, and time.  Skin: Skin is warm and dry. No rash noted.  Psychiatric: His mood appears anxious.  Nursing note and vitals reviewed.    ED Treatments / Results  DIAGNOSTIC STUDIES:  Oxygen Saturation is 100% on RA, normal by my interpretation.    COORDINATION OF CARE:  1:44 PM Discussed treatment plan with pt at bedside and pt agreed to plan.   Labs (all labs ordered are listed, but only abnormal results are displayed) Labs Reviewed  URINALYSIS, ROUTINE W REFLEX  MICROSCOPIC - Abnormal; Notable for the following:       Result Value   Color, Urine COLORLESS (*)    Specific Gravity, Urine 1.002 (*)    Hgb urine dipstick SMALL (*)    All other components within normal limits    EKG  EKG Interpretation None       Radiology Ct Renal Stone Study  Result Date: 02/02/2016 CLINICAL DATA:  Left flank pain EXAM: CT ABDOMEN AND PELVIS WITHOUT CONTRAST TECHNIQUE: Multidetector CT imaging of the abdomen and pelvis was performed following the standard protocol without oral or intravenous contrast material administration. COMPARISON:  May 21, 2015 FINDINGS: Lower chest: Lung bases are clear. Hepatobiliary: No focal liver lesions are appreciable on this noncontrast enhanced study. Gallbladder wall is not appreciably thickened. There is no biliary duct dilatation. Pancreas: No pancreatic mass or inflammatory focus. Spleen: No splenic lesions are evident. Clips are noted in the inferior splenic region posteriorly,  unchanged. There is a small accessory spleen medial to the spleen anteriorly. Adrenals/Urinary Tract: Adrenals appear unremarkable bilaterally. Kidneys bilaterally show no evident mass or hydronephrosis on either side. There is a calculus in the mid right kidney measuring 6 x 5 mm. There is a a 1 mm calculus in the mid left kidney as well as a tiny calculus in the lower pole left kidney. There is no ureteral calculus on either side. Urinary bladder is midline with wall thickness within normal limits. Stomach/Bowel: The rectum is distended with stool. There is no appreciable bowel wall or mesenteric thickening. No bowel obstruction. No free air or portal venous air. Vascular/Lymphatic: There is mild calcification in the abdominal aorta. There is no abdominal aortic aneurysm. There is no appreciable mesenteric vessel narrowing on this noncontrast-enhanced study. No adenopathy is appreciable in the abdomen or pelvis. Reproductive: There is a small calcification in the posterior prostate. Prostate and seminal vesicles appear normal in size and contour. There is no pelvic mass or pelvic fluid collection. Other: Appendix appears normal. There is no appreciable ascites or abscess in the abdomen or pelvis. Musculoskeletal: There is degenerative change in the upper lumbar region. There is evidence of a prior anterior wedge fracture of the L1 vertebral body with remodeling, stable. There are no blastic or lytic bone lesions. There is no intramuscular or abdominal wall lesion. IMPRESSION: Tiny intrarenal calculi on the left. 6 x 5 mm calculus mid right kidney. No ureteral calculus or hydronephrosis on either side. There is a small prostatic calculus. No bowel obstruction.  No abscess.  Appendix region appears normal. Stable anterior wedging of the L1 vertebral body, consistent with old trauma. Electronically Signed   By: Bretta Bang III M.D.   On: 02/02/2016 14:55    Procedures Procedures (including critical care  time)  Medications Ordered in ED Medications  HYDROmorphone (DILAUDID) injection 1 mg (1 mg Intravenous Given 02/02/16 1357)  methocarbamol (ROBAXIN) tablet 1,000 mg (not administered)  ketorolac (TORADOL) 30 MG/ML injection 30 mg (30 mg Intravenous Given 02/02/16 1357)  ondansetron (ZOFRAN) injection 4 mg (4 mg Intravenous Given 02/02/16 1357)  tamsulosin (FLOMAX) capsule 0.4 mg (0.4 mg Oral Given 02/02/16 1358)     Initial Impression / Assessment and Plan / ED Course  I have reviewed the triage vital signs and the nursing notes.  Pertinent labs & imaging results that were available during my care of the patient were reviewed by me and considered in my medical decision making (see chart for details).     Patient with traces  of blood in his urine. However CT shows stable solitary stone in the left renal parenchyma. No sign of hydronephrosis or ureteral dilatation.  Final Clinical Impressions(s) / ED Diagnoses   Final diagnoses:  Chronic bilateral low back pain without sciatica    New Prescriptions New Prescriptions   METHOCARBAMOL (ROBAXIN) 500 MG TABLET    Take 1 tablet (500 mg total) by mouth 3 (three) times daily between meals as needed.   NAPROXEN (NAPROSYN) 500 MG TABLET    Take 1 tablet (500 mg total) by mouth 2 (two) times daily.  I personally performed the services described in this documentation, which was scribed in my presence. The recorded information has been reviewed and is accurate.     Joshua Porter, MD 02/02/16 1527    Joshua Porter, MD 02/02/16 1527    Joshua Porter, MD 02/02/16 [id number]

## 2016-06-15 ENCOUNTER — Emergency Department
Admission: EM | Admit: 2016-06-15 | Discharge: 2016-06-15 | Disposition: A | Discharge status: Home / Self Care | Payer: Self-pay | Attending: Emergency Medicine | Admitting: Emergency Medicine

## 2016-06-15 ENCOUNTER — Emergency Department: Payer: Self-pay

## 2016-06-15 ENCOUNTER — Encounter: Payer: Self-pay | Admitting: Emergency Medicine

## 2016-06-15 DIAGNOSIS — F909 Attention-deficit hyperactivity disorder, unspecified type: Secondary | ICD-10-CM | POA: Insufficient documentation

## 2016-06-15 DIAGNOSIS — F17210 Nicotine dependence, cigarettes, uncomplicated: Secondary | ICD-10-CM | POA: Insufficient documentation

## 2016-06-15 DIAGNOSIS — R112 Nausea with vomiting, unspecified: Secondary | ICD-10-CM | POA: Insufficient documentation

## 2016-06-15 DIAGNOSIS — K5900 Constipation, unspecified: Secondary | ICD-10-CM | POA: Insufficient documentation

## 2016-06-15 DIAGNOSIS — R109 Unspecified abdominal pain: Secondary | ICD-10-CM | POA: Insufficient documentation

## 2016-06-15 LAB — COMPREHENSIVE METABOLIC PANEL
ALT: 92 U/L — AB (ref 17–63)
ANION GAP: 6 (ref 5–15)
AST: 72 U/L — ABNORMAL HIGH (ref 15–41)
Albumin: 3.6 g/dL (ref 3.5–5.0)
Alkaline Phosphatase: 56 U/L (ref 38–126)
BUN: 12 mg/dL (ref 6–20)
CHLORIDE: 102 mmol/L (ref 101–111)
CO2: 27 mmol/L (ref 22–32)
Calcium: 9.3 mg/dL (ref 8.9–10.3)
Creatinine, Ser: 1.13 mg/dL (ref 0.61–1.24)
GFR calc non Af Amer: >60 mL/min (ref >60)
Glucose, Bld: 144 mg/dL — ABNORMAL HIGH (ref 65–99)
POTASSIUM: 3.8 mmol/L (ref 3.5–5.1)
SODIUM: 135 mmol/L (ref 135–145)
Total Bilirubin: 0.5 mg/dL (ref 0.3–1.2)
Total Protein: 7.3 g/dL (ref 6.5–8.1)

## 2016-06-15 LAB — URINALYSIS, COMPLETE (UACMP) WITH MICROSCOPIC
BILIRUBIN URINE: NEGATIVE
Bacteria, UA: NONE SEEN
Glucose, UA: NEGATIVE mg/dL
KETONES UR: NEGATIVE mg/dL
Nitrite: NEGATIVE
Protein, ur: 30 mg/dL — AB
SQUAMOUS EPITHELIAL / LPF: NONE SEEN
Specific Gravity, Urine: 1.011 (ref 1.005–1.030)
pH: 6 (ref 5.0–8.0)

## 2016-06-15 LAB — CBC
HEMATOCRIT: 40.5 % (ref 40.0–52.0)
HEMOGLOBIN: 14 g/dL (ref 13.0–18.0)
MCH: 32.3 pg (ref 26.0–34.0)
MCHC: 34.6 g/dL (ref 32.0–36.0)
MCV: 93.4 fL (ref 80.0–100.0)
Platelets: 139 10*3/uL — ABNORMAL LOW (ref 150–440)
RBC: 4.34 MIL/uL — AB (ref 4.40–5.90)
RDW: 14.1 % (ref 11.5–14.5)
WBC: 5.2 10*3/uL (ref 3.8–10.6)

## 2016-06-15 LAB — LIPASE, BLOOD: LIPASE: 19 U/L (ref 11–51)

## 2016-06-15 MED ORDER — METOCLOPRAMIDE HCL 10 MG PO TABS: 10.0000 mg | ORAL_TABLET | Freq: Four times a day (QID) | ORAL | 0 refills | Status: DC | PRN

## 2016-06-15 MED ORDER — KETOROLAC TROMETHAMINE 30 MG/ML IJ SOLN
30.0000 mg | Freq: Once | INTRAMUSCULAR | Status: AC
  Administered 2016-06-15: 30 mg via INTRAVENOUS
  Filled 2016-06-15: qty 1

## 2016-06-15 MED ORDER — METOCLOPRAMIDE HCL 5 MG/ML IJ SOLN
INTRAMUSCULAR | Status: AC
  Filled 2016-06-15: qty 2

## 2016-06-15 MED ORDER — GI COCKTAIL ~~LOC~~
30.0000 mL | Freq: Once | ORAL | Status: AC
  Administered 2016-06-15: 30 mL via ORAL

## 2016-06-15 MED ORDER — GI COCKTAIL ~~LOC~~
ORAL | Status: AC
  Filled 2016-06-15: qty 30

## 2016-06-15 MED ORDER — ONDANSETRON HCL 4 MG/2ML IJ SOLN
4.0000 mg | Freq: Once | INTRAMUSCULAR | Status: AC
  Administered 2016-06-15: 4 mg via INTRAVENOUS
  Filled 2016-06-15: qty 2

## 2016-06-15 MED ORDER — SODIUM CHLORIDE 0.9 % IV BOLUS (SEPSIS)
1000.0000 mL | Freq: Once | INTRAVENOUS | Status: AC
Start: 2016-06-15 — End: 2016-06-15
  Administered 2016-06-15: 1000 mL via INTRAVENOUS

## 2016-06-15 MED ORDER — METOCLOPRAMIDE HCL 5 MG/ML IJ SOLN
10.0000 mg | Freq: Once | INTRAMUSCULAR | Status: AC
  Administered 2016-06-15: 10 mg via INTRAVENOUS

## 2016-06-15 NOTE — ED Notes (Signed)
Pt refuses d/c VS , states " i've got to get going"

## 2016-06-15 NOTE — ED Notes (Signed)
Pt restless, walks out of room to nurses station, asking where the doctor is, when told that he is in a room with another patient, he walks toward the other pt's room.  Pt notified that he can not go in there, and asked to go back to room to wait on doctor to come to him.  Pt back to room at this time.

## 2016-06-15 NOTE — ED Notes (Signed)
Pt restless, pacing in room, asking for graham crackers.  Pt notified that we are waiting on the results of his scan prior to allowing him to eat/drink anything.  Pt states, "where is the doctor, I need something, I need whatever treatment yall just gave me, if my gallbladder needs to come out lets do this."  Pt seems agitated.  Expressed again to pt that we are waiting on the results of his scan and we will update him as soon as possible.  MD aware.

## 2016-06-15 NOTE — ED Provider Notes (Signed)
[facility] Emergency Department Provider Note  Time seen: 5:27 PM  I have reviewed the triage vital signs and the nursing notes.   HISTORY  Chief Complaint Abdominal Pain    HPI [name] is a 41 y.o. male with a past medical history bipolar, ADHD, chronic pain, hepatitis C, presents to the emergency department for abdominal pain. According to the patient states approximately 1 month of intermittent abdominal discomfort but severe right flank pain over the past 1-2 days. Describes as sharp severe. States nausea and vomiting intermittently. States constipation but denies diarrhea. Denies fever. States a long history of abdominal issues including constipation with frequent nausea and vomiting. Patient states he has had abdominal pain in the past but this is much more severe over the past 2 days. He also has a history of kidney stones but denies hematuria or dysuria. He describes the pain as an 8/10 sharp severe pain in the right flank.  Past Medical History:  Diagnosis Date  . ADHD (attention deficit hyperactivity disorder)   . Bipolar 1 disorder (HCC)   . Chronic pain    chronic l leg pain  . DDD (degenerative disc disease), lumbar   . Degenerative disc disease, lumbar   . Depression   . GERD (gastroesophageal reflux disease)   . Hepatitis C   . Hepatitis C   . History of stomach ulcers   . Sciatica     Patient Active Problem List   Diagnosis Date Noted  . Cocaine use disorder, moderate, dependence (HCC) 05/23/2015  . Opioid use disorder, moderate, dependence (HCC) 05/23/2015  . Benzodiazepine abuse in remission 05/23/2015  . Substance or medication-induced bipolar and related disorder with onset during intoxication (HCC) 05/23/2015  . Hepatitis C   . 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 FRACTURE SURGERY    . HARDWARE REMOVAL  01/27/2012   Procedure: HARDWARE  REMOVAL;  Surgeon: Kathryne Hitch, MD;  Location: [facility] ORS;  Service: Orthopedics;  Laterality: Left;  . TIBIA IM NAIL INSERTION  01/27/2012   Procedure: INTRAMEDULLARY (IM) NAIL TIBIAL;  Surgeon: Kathryne Hitch, MD;  Location: [facility];  Service: Orthopedics;  Laterality: Left;  Removal of IM Rod and Screws Left Tibia with Exchange Nail, Allograft Bone Graft Left Tibia    Prior to Admission medications   Medication Sig Start Date End Date Taking? Authorizing Provider  cyclobenzaprine (FLEXERIL) 5 MG tablet Take 1 tablet (5 mg total) by mouth 3 (three) times daily as needed for muscle spasms. 01/26/16   Menshew, Charlesetta Ivory, PA-C  DULoxetine (CYMBALTA) 30 MG capsule Take 1 capsule (30 mg total) by mouth daily. For depression 05/23/15   Armandina Stammer I, NP  gabapentin (NEURONTIN) 400 MG capsule Take 1 capsule (400 mg total) by mouth 4 (four) times daily - after meals and at bedtime. For agitation/pain 05/23/15   Armandina Stammer I, NP  hydrOXYzine (ATARAX/VISTARIL) 25 MG tablet Take 1 tablet (25 mg) four times daily: For anxiety 05/23/15   Armandina Stammer I, NP  methocarbamol (ROBAXIN) 500 MG tablet Take 1 tablet (500 mg total) by mouth 3 (three) times daily between meals as needed. 02/02/16   [name], MD  mirtazapine (REMERON) 7.5 MG tablet Take 1 tablet (7.5 mg total) by mouth at bedtime. For sleep 05/23/15   Armandina Stammer I, NP  naproxen (NAPROSYN) 500 MG tablet Take 1 tablet (500 mg total) by  mouth 2 (two) times daily. 02/02/16   [name], MD  nicotine (NICODERM CQ - DOSED IN MG/24 HOURS) 21 mg/24hr patch Place 1 patch (21 mg total) onto the skin daily. For smoking cessation 05/23/15   Armandina Stammer I, NP  oxyCODONE-acetaminophen (PERCOCET/ROXICET) 5-325 MG tablet Take 1 tablet by mouth every 4 (four) hours as needed for severe pain. 12/25/15   Barrett Henle, PA-C  penicillin v potassium (VEETID) 500 MG tablet Take 1 tablet (500 mg total) by mouth 2 (two) times daily. 12/25/15    Barrett Henle, PA-C  traMADol (ULTRAM) 50 MG tablet Take 1 tablet (50 mg total) by mouth every 6 (six) hours as needed. 09/10/15   Janne Napoleon, NP    Allergies  Allergen Reactions  . Acetaminophen Other (See Comments)    Due to Ulcers/ Liver Damage  . Morphine And Related Itching  . Buprenorphine Hcl Itching  . Meperidine Rash    Family History  Problem Relation Age of Onset  . Diabetes Mother     Social History Social History  Substance Use Topics  . Smoking status: Current Every Day Smoker    Packs/day: 1.00    Years: 24.00    Types: Cigarettes  . Smokeless tobacco: Former Neurosurgeon    Quit date: 08/08/2006  . Alcohol use Yes     Comment: occasionally liquor    Review of Systems Constitutional: Negative for fever. Cardiovascular: Negative for chest pain. Respiratory: Negative for shortness of breath. Gastrointestinal: Positive for right flank pain. Positive for nausea and vomiting. Negative for diarrhea. Positive for constipation. Genitourinary: Negative for dysuria. Negative for hematuria Musculoskeletal: Some right back pain. Skin: Negative for rash. Neurological: Negative for headache All other ROS negative  ____________________________________________   PHYSICAL EXAM:  VITAL SIGNS: ED Triage Vitals [06/15/16 1542]  Enc Vitals Group     BP (!) 163/96     Pulse Rate 93     Resp 16     Temp 98.1 F (36.7 C)     Temp Source Oral     SpO2 99 %     Weight 160 lb (72.6 kg)     Height 5\' 2"  (1.575 m)     Head Circumference      Peak Flow      Pain Score 10     Pain Loc      Pain Edu?      Excl. in GC?     Constitutional: Alert and oriented. Well appearing and in no distress. Eyes: Normal exam ENT   Head: Normocephalic and atraumatic   Mouth/Throat: Mucous membranes are moist. Cardiovascular: Normal rate, regular rhythm. No murmur Respiratory: Normal respiratory effort without tachypnea nor retractions. Breath sounds are  clear Gastrointestinal: Soft and nontender. No distention.  There is no CVA tenderness. Musculoskeletal: Nontender with normal range of motion in all extremities. Neurologic:  Normal speech and language. No gross focal neurologic deficits Skin:  Skin is warm, dry and intact.  Psychiatric: Mood and affect are normal.   ____________________________________________   RADIOLOGY  CT largely negative for acute abnormality  ____________________________________________   INITIAL IMPRESSION / ASSESSMENT AND PLAN / ED COURSE  Pertinent labs & imaging results that were available during my care of the patient were reviewed by me and considered in my medical decision making (see chart for details).  The patient presents to the emergency department with intermittent abdominal pain greater than one month above her sharp severe pain 2 days in the right flank.  Patient's labs are largely within normal limits mild LFT elevation however history of hepatitis C. Normal alkaline phosphatase and bilirubin. Normal white blood cell count. Patient's urinalysis does show a mild amount of red blood cells within the urine, history of prior kidney stones. We'll obtain a CT renal scan to evaluate. We'll treat with Toradol and Zofran IV fluids in the emergency department while awaiting CT results.  CT largely negative for acute abnormality. Patient continues to have pain although he states it is improved after GI cocktail. Patient's symptoms are very suggestive of possible gastritis or gastroparesis. We will treat with Reglan. Patient will be discharged home with PCP follow-up.  ____________________________________________   FINAL CLINICAL IMPRESSION(S) / ED DIAGNOSES  Right flank pain    Minna AntisPaduchowski, Sharanya Templin, MD 06/15/16 1910

## 2016-06-15 NOTE — ED Triage Notes (Signed)
Patient states he has had a month long episode of abdominal pain / burning.  Patient states he feels nauseated.  Has been taking Maalox.

## 2016-06-15 NOTE — ED Notes (Signed)
Pt family member at nurses station asking for MD again at this time, states " he has been having some dizziness and he didn't tell the doctor "  family made aware that MD is still in another pt room.

## 2016-06-17 LAB — URINE CULTURE

## 2016-11-24 ENCOUNTER — Encounter (HOSPITAL_COMMUNITY): Payer: Self-pay | Admitting: Emergency Medicine

## 2016-11-24 ENCOUNTER — Emergency Department (HOSPITAL_COMMUNITY)
Admission: EM | Admit: 2016-11-24 | Discharge: 2016-11-24 | Disposition: A | Discharge status: Home / Self Care | Payer: Self-pay | Attending: Emergency Medicine | Admitting: Emergency Medicine

## 2016-11-24 ENCOUNTER — Emergency Department (HOSPITAL_COMMUNITY): Payer: Self-pay

## 2016-11-24 DIAGNOSIS — W010XXA Fall on same level from slipping, tripping and stumbling without subsequent striking against object, initial encounter: Secondary | ICD-10-CM | POA: Insufficient documentation

## 2016-11-24 DIAGNOSIS — Y929 Unspecified place or not applicable: Secondary | ICD-10-CM | POA: Insufficient documentation

## 2016-11-24 DIAGNOSIS — F17210 Nicotine dependence, cigarettes, uncomplicated: Secondary | ICD-10-CM | POA: Insufficient documentation

## 2016-11-24 DIAGNOSIS — S52001A Unspecified fracture of upper end of right ulna, initial encounter for closed fracture: Secondary | ICD-10-CM | POA: Insufficient documentation

## 2016-11-24 DIAGNOSIS — Y999 Unspecified external cause status: Secondary | ICD-10-CM | POA: Insufficient documentation

## 2016-11-24 DIAGNOSIS — Z79899 Other long term (current) drug therapy: Secondary | ICD-10-CM | POA: Insufficient documentation

## 2016-11-24 DIAGNOSIS — S93402A Sprain of unspecified ligament of left ankle, initial encounter: Secondary | ICD-10-CM | POA: Insufficient documentation

## 2016-11-24 DIAGNOSIS — Y939 Activity, unspecified: Secondary | ICD-10-CM | POA: Insufficient documentation

## 2016-11-24 HISTORY — DX: Other psychoactive substance abuse, uncomplicated: F19.10

## 2016-11-24 MED ORDER — OXYCODONE-ACETAMINOPHEN 5-325 MG PO TABS: 1.0000 | ORAL_TABLET | ORAL | 0 refills | Status: DC | PRN

## 2016-11-24 NOTE — ED Provider Notes (Signed)
[facility] Provider Note   CSN: [id number] Arrival date & time: 11/24/16  1053     History   Chief Complaint Chief Complaint  Patient presents with  . Fall    HPI Joshua Thomas is a 41 y.o. male.  HPI   Joshua Thomas is a 41 y.o. male who presents to the Emergency Department complaining of right elbow pain and swelling for 3 days. Pain began secondary to a mechanical fall in which he fell and landed on his right elbow.  He describes a constant, throbbing pain to the elbow that is worse with movement.  He states he is unable to fully extend his right arm.  He complains of swelling to the posterior elbow that has improved since the fall.  No shoulder pain or wrist pain, no numbness or tingling of his fingers.  He also complains of left ankle pain and has a history of previous left ankle surgery.  He is concerned about the hardware in his ankle, but ankle pain is not as bothersome as he is elbow.  He denies head injury or LOC.   Past Medical History:  Diagnosis Date  . ADHD (attention deficit hyperactivity disorder)   . Bipolar 1 disorder (HCC)   . Chronic back pain   . Chronic pain    chronic l leg pain  . DDD (degenerative disc disease), lumbar   . Degenerative disc disease, lumbar   . Depression   . GERD (gastroesophageal reflux disease)   . Hepatitis C   . Hepatitis C   . History of stomach ulcers   . Sciatica   . Substance abuse [facility](HCC)     Patient Active Problem List   Diagnosis Date Noted  . Cocaine use disorder, moderate, dependence (HCC) 05/23/2015  . Opioid use disorder, moderate, dependence (HCC) 05/23/2015  . 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 Substance or medication-induced bipolar and related disorder with onset during intoxication (HCC) 05/23/2015  . Hepatitis C   . 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 FRACTURE SURGERY          Home Medications    Prior to Admission medications   Medication Sig Start Date End Date Taking? Authorizing Provider  cyclobenzaprine (FLEXERIL) 5 MG tablet Take 1 tablet (5 mg total) by mouth 3 (three) times daily as needed for muscle spasms. 01/26/16   Menshew, Charlesetta IvoryJenise V Bacon, PA-C  DULoxetine (CYMBALTA) 30 MG capsule Take 1 capsule (30 mg total) by mouth daily. For depression 05/23/15   Armandina StammerNwoko, Agnes I, NP  gabapentin (NEURONTIN) 400 MG capsule Take 1 capsule (400 mg total) by mouth 4 (four) times daily - after meals and at bedtime. For agitation/pain 05/23/15   Armandina StammerNwoko, Agnes I, NP  hydrOXYzine (ATARAX/VISTARIL) 25 MG tablet Take 1 tablet (25 mg) four times daily: For anxiety 05/23/15   Armandina StammerNwoko, Agnes I, NP  methocarbamol (ROBAXIN) 500 MG tablet Take 1 tablet (500 mg total) by mouth 3 (three) times daily between meals as needed. 02/02/16   Rolland PorterJames, Mark, MD  metoCLOPramide (REGLAN) 10 MG tablet Take 1 tablet (10 mg total) by mouth every 6 (six) hours as needed for nausea. 06/15/16   Minna AntisPaduchowski, Kevin, MD  mirtazapine (REMERON) 7.5 MG tablet Take 1 tablet (7.5 mg total) by mouth at bedtime. For sleep 05/23/15   Armandina StammerNwoko, Agnes I, NP  naproxen (NAPROSYN) 500 MG tablet Take 1 tablet (  500 mg total) by mouth 2 (two) times daily. 02/02/16   [name], MD  nicotine (NICODERM CQ - DOSED IN MG/24 HOURS) 21 mg/24hr patch Place 1 patch (21 mg total) onto the skin daily. For smoking cessation 05/23/15   Armandina Stammer I, NP  oxyCODONE-acetaminophen (PERCOCET/ROXICET) 5-325 MG tablet Take 1 tablet by mouth every 4 (four) hours as needed for severe pain. 12/25/15   Barrett Henle, PA-C  penicillin v potassium (VEETID) 500 MG tablet Take 1 tablet (500 mg total) by mouth 2 (two) times daily. 12/25/15   Barrett Henle, PA-C  traMADol (ULTRAM) 50 MG tablet Take 1 tablet (50 mg total) by mouth every 6 (six) hours as needed. 09/10/15   Janne Napoleon, NP    Family History Family History  Problem  Relation Age of Onset  . Diabetes Mother     Social History Social History   Tobacco Use  . Smoking status: Current Every Day Smoker    Packs/day: 1.00    Years: 24.00    Pack years: 24.00    Types: Cigarettes  . Smokeless tobacco: Former Neurosurgeon    Quit date: 08/08/2006  Substance Use Topics  . Alcohol use: Yes    Comment: occasionally liquor  . Drug use: Yes    Types: Cocaine    Comment: months ago     Allergies   Acetaminophen; Morphine and related; Buprenorphine hcl; and Meperidine   Review of Systems Review of Systems  Constitutional: Negative for chills and fever.  Cardiovascular: Negative for chest pain.  Gastrointestinal: Negative for nausea and vomiting.  Musculoskeletal: Positive for arthralgias (Right elbow pain and swelling, left ankle pain). Negative for back pain and neck pain.  Skin: Negative for color change and wound.  Neurological: Negative for dizziness, syncope, weakness, numbness and headaches.  All other systems reviewed and are negative.    Physical Exam Updated Vital Signs BP (!) 145/104 (BP Location: Left Arm)   Pulse 94   Temp 98.5 F (36.9 C) (Oral)   Resp 17   Ht 5\' 3"  (1.6 m)   Wt 74.4 kg (164 lb)   SpO2 100%   BMI 29.05 kg/m   Physical Exam  Constitutional: He is oriented to person, place, and time. He appears well-developed and well-nourished. No distress.  HENT:  Head: Atraumatic.  Neck: Normal range of motion. Neck supple.  Cardiovascular: Normal rate, regular rhythm and intact distal pulses.  Radial pulses are brisk and symmetrical  Pulmonary/Chest: Effort normal and breath sounds normal. He exhibits no tenderness.  Musculoskeletal: He exhibits edema and tenderness. He exhibits no deformity.  Tender to palpation at the olecranon process of the right elbow.  Minimal edema.  No bony deformity.  Patient is able to supinate, but unable to fully extend at the right elbow.  No open wounds, compartments are soft.  Mild tenderness to  palpation at the lateral left ankle.  No edema, ecchymosis, or abrasion.  No tenderness proximally  Neurological: He is alert and oriented to person, place, and time. No sensory deficit.  Skin: Skin is warm. Capillary refill takes less than 2 seconds.  Nursing note and vitals reviewed.    ED Treatments / Results  Labs (all labs ordered are listed, but only abnormal results are displayed) Labs Reviewed - No data to display  EKG  EKG Interpretation None       Radiology Dg Elbow Complete Right  Result Date: 11/24/2016 CLINICAL DATA:  41 year old male status post fall [place]. Struck right elbow on a rock a. Posterior aspect elbow pain. EXAM: RIGHT ELBOW - COMPLETE 3+ VIEW COMPARISON:  None. FINDINGS: Minimally displaced fracture through a posterior ulna olecranon osteophyte (lateral view). Preserved joint spaces and alignment at the right elbow. No joint effusion. The distal humerus and proximal right radius appear intact. IMPRESSION: Fracture through a degenerative osteophyte at the posterior proximal ulna. No other acute fracture or dislocation identified about the right elbow. Electronically Signed   By: Odessa FlemingH  Hall M.D.   On: 11/24/2016 12:11   Dg Ankle Complete Left  Result Date: 11/24/2016 CLINICAL DATA:  [place] Slip in mud 3 days ago. Left ankle pain. Initial encounter. EXAM: LEFT ANKLE COMPLETE - 3+ VIEW COMPARISON:  08/24/2014 FINDINGS: Remote distal tibia and fibular fractures that are healed. Tibial nail with distal interlocking screws that show chronic mild lucency about the lateral screws, stable. Normal ankle alignment. IMPRESSION: 1. No acute osseous finding. 2. Remote and healed distal tibia and fibula fractures. Mild lucency around tibial interlocking screws, stable from 2016. Electronically Signed   By: Marnee SpringJonathon  Watts M.D.   On: 11/24/2016 12:22    Procedures Procedures (including critical care time)  Medications Ordered in  ED Medications - No data to display   Initial Impression / Assessment and Plan / ED Course  I have reviewed the triage vital signs and the nursing notes.  Pertinent labs & imaging results that were available during my care of the patient were reviewed by me and considered in my medical decision making (see chart for details).     12:35  Consulted Dr. Melvyn Novasrtmann regarding XR findings.  Recommends posterior splint and he will see pt in his office on Friday 11/26/16  Pt reviewed on the narcotic database, no recent rx's filed.  Will prescribe short course of percocet.  Patient agrees to treatment plan and orthopedic follow-up instructions  On recheck after splint application, remains neurovascularly intact.  Sling applied.  Patient offered ASO for his ankle, but patient declined stating that his main concern is his elbow.   Final Clinical Impressions(s) / ED Diagnoses   Final diagnoses:  Closed fracture of proximal end of right ulna, unspecified fracture morphology, initial encounter  Sprain of left ankle, unspecified ligament, initial encounter    ED Discharge Orders    None       Pauline Ausriplett, Jered Heiny, PA-C 11/24/16 1513    Samuel JesterMcManus, Kathleen, DO 11/26/16 1422

## 2016-11-24 NOTE — ED Notes (Signed)
Pt reports that he fell going down a hill on Sunday twisting his L ankle and hitting his R elbow on a rock  His R elbow is tender to touch- he can pronate and suppinate, but reports he cannot fully extend it

## 2016-11-24 NOTE — Discharge Instructions (Signed)
Keep the arm splinted until you see Dr. Melvyn Novasrtmann.  He wants to see you in his office on Friday 11/26/16.  You can call his office for an appointment time.

## 2016-11-24 NOTE — ED Notes (Signed)
TT in to assess 

## 2016-11-24 NOTE — ED Triage Notes (Signed)
PT states he fell down a muddy hill x3 days ago and his right shoulder/elbow and re-injured his left ankle and feels like the hardware from a previous surgery is no displaced.

## 2016-11-24 NOTE — ED Notes (Signed)
From radiology 

## 2016-12-03 ENCOUNTER — Emergency Department (HOSPITAL_COMMUNITY)
Admission: EM | Admit: 2016-12-03 | Discharge: 2016-12-03 | Disposition: A | Discharge status: Home / Self Care | Payer: Self-pay | Attending: Emergency Medicine | Admitting: Emergency Medicine

## 2016-12-03 ENCOUNTER — Encounter (HOSPITAL_COMMUNITY): Payer: Self-pay | Admitting: Emergency Medicine

## 2016-12-03 ENCOUNTER — Other Ambulatory Visit: Payer: Self-pay

## 2016-12-03 DIAGNOSIS — Z79899 Other long term (current) drug therapy: Secondary | ICD-10-CM | POA: Insufficient documentation

## 2016-12-03 DIAGNOSIS — F319 Bipolar disorder, unspecified: Secondary | ICD-10-CM | POA: Insufficient documentation

## 2016-12-03 DIAGNOSIS — S52024D Nondisplaced fracture of olecranon process without intraarticular extension of right ulna, subsequent encounter for closed fracture with routine healing: Secondary | ICD-10-CM | POA: Insufficient documentation

## 2016-12-03 DIAGNOSIS — F1120 Opioid dependence, uncomplicated: Secondary | ICD-10-CM | POA: Insufficient documentation

## 2016-12-03 DIAGNOSIS — F1420 Cocaine dependence, uncomplicated: Secondary | ICD-10-CM | POA: Insufficient documentation

## 2016-12-03 DIAGNOSIS — S52021D Displaced fracture of olecranon process without intraarticular extension of right ulna, subsequent encounter for closed fracture with routine healing: Secondary | ICD-10-CM

## 2016-12-03 DIAGNOSIS — F909 Attention-deficit hyperactivity disorder, unspecified type: Secondary | ICD-10-CM | POA: Insufficient documentation

## 2016-12-03 DIAGNOSIS — X58XXXA Exposure to other specified factors, initial encounter: Secondary | ICD-10-CM | POA: Insufficient documentation

## 2016-12-03 DIAGNOSIS — F17210 Nicotine dependence, cigarettes, uncomplicated: Secondary | ICD-10-CM | POA: Insufficient documentation

## 2016-12-03 MED ORDER — OXYCODONE HCL 5 MG PO TABS
5.0000 mg | ORAL_TABLET | ORAL | 0 refills | Status: DC | PRN
Start: 2016-12-03 — End: 2017-01-20

## 2016-12-03 MED ORDER — OXYCODONE HCL 5 MG PO TABS
10.0000 mg | ORAL_TABLET | Freq: Once | ORAL | Status: AC
  Administered 2016-12-03: 10 mg via ORAL
  Filled 2016-12-03: qty 2

## 2016-12-03 NOTE — Discharge Instructions (Signed)
You may take the oxycodone prescribed for pain relief.  This will make you drowsy - do not drive within 4 hours of taking this medication. I have prescribed oxycodone without tylenol for you which will be better given your liver issues.  Follow up with Dr. Melvyn Novasrtmann as discussed and follow his instructions for your ongoing healing of your elbow injury.

## 2016-12-03 NOTE — ED Triage Notes (Signed)
Pt had injury to right elbow and is broken. Fu not till 2 weeks. C/o pain.

## 2016-12-04 NOTE — ED Provider Notes (Signed)
[facility] EMERGENCY DEPARTMENT Provider Note   CSN: [id number] Arrival date & time: 12/03/16  1326     History   Chief Complaint Chief Complaint  Patient presents with  . Arm Pain    HPI Joshua Thomas is a 41 y.o. male presenting with persistent pain at his right elbow since fracturing his right olecranon on 11/14.  He has been seen by Dr. Melvyn Novasrtmann who prescribed him a special brace to wear and was advised to perform ROM exercises since he is having problems with full elbow extension.  He is supposed to follow up with him in 2 weeks. In the interim,  He was not able to afford the brace prescribed so purchased an elastic compression sleeve which he wears in association with a sling.  He has persistent pain, worsened with movement and if it accidentally bumps the site.  He refused the initial splint he was fitted in here, removed as it caused pain. He denies weakness or numbness in his forearm , hand or fingers. He has taken ibuprofen and aleve without relief.  The history is provided by the patient and the spouse.    Past Medical History:  Diagnosis Date  . ADHD (attention deficit hyperactivity disorder)   . Bipolar 1 disorder (HCC)   . Chronic back pain   . Chronic pain    chronic l leg pain  . DDD (degenerative disc disease), lumbar   . Degenerative disc disease, lumbar   . Depression   . GERD (gastroesophageal reflux disease)   . Hepatitis C   . Hepatitis C   . History of stomach ulcers   . Sciatica   . Substance abuse [facility](HCC)     Patient Active Problem List   Diagnosis Date Noted  . Cocaine use disorder, moderate, dependence (HCC) 05/23/2015  . Opioid use disorder, moderate, dependence (HCC) 05/23/2015  . 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 Substance or medication-induced bipolar and related disorder with onset during intoxication (HCC) 05/23/2015  . Hepatitis C   . 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 FRACTURE SURGERY    . HARDWARE REMOVAL  01/27/2012   Procedure: HARDWARE REMOVAL;  Surgeon: Kathryne Hitchhristopher Y Blackman, MD;  Location: [facility];  Service: Orthopedics;  Laterality: Left;  . TIBIA IM NAIL INSERTION  01/27/2012   Procedure: INTRAMEDULLARY (IM) NAIL TIBIAL;  Surgeon: Kathryne Hitchhristopher Y Blackman, MD;  Location: [facility];  Service: Orthopedics;  Laterality: Left;  Removal of IM Rod and Screws Left Tibia with Exchange Nail, Allograft Bone Graft Left Tibia       Home Medications    Prior to Admission medications   Medication Sig Start Date End Date Taking? Authorizing Provider  DULoxetine (CYMBALTA) 30 MG capsule Take 1 capsule (30 mg total) by mouth daily. For depression 05/23/15  Yes Nwoko, Nicole KindredAgnes I, NP  QUEtiapine (SEROQUEL) 100 MG tablet Take 100 mg by mouth 2 (two) times daily.   Yes [provider]  metoCLOPramide (REGLAN) 10 MG tablet Take 1 tablet (10 mg total) by mouth every 6 (six) hours as needed for nausea. Patient not taking: Reported on 12/03/2016 06/15/16   Minna AntisPaduchowski, Kevin, MD  mirtazapine (REMERON) 7.5 MG tablet Take 1 tablet (7.5 mg total) by mouth at bedtime. For sleep Patient not taking: Reported on 12/03/2016 05/23/15   Armandina StammerNwoko, Agnes I, NP  oxyCODONE (ROXICODONE) 5 MG immediate release tablet Take 1 tablet (5  mg total) by mouth every 4 (four) hours as needed for severe pain. 12/03/16   Burgess Amor, PA-C  oxyCODONE-acetaminophen (PERCOCET/ROXICET) 5-325 MG tablet Take 1 tablet every 4 (four) hours as needed by mouth. Patient not taking: Reported on 12/03/2016 11/24/16   Pauline Aus, PA-C    Family History Family History  Problem Relation Age of Onset  . Diabetes Mother     Social History Social History   Tobacco Use  . Smoking status: Current Every Day Smoker    Packs/day: 1.00    Years: 24.00    Pack years: 24.00    Types: Cigarettes  . Smokeless tobacco: Former Neurosurgeon    Quit date: 08/08/2006  Substance Use  Topics  . Alcohol use: Yes    Comment: occasionally liquor  . Drug use: Yes    Types: Cocaine    Comment: months ago     Allergies   Acetaminophen and Morphine and related   Review of Systems Review of Systems  Constitutional: Negative for fever.  Musculoskeletal: Positive for arthralgias and joint swelling. Negative for myalgias.  Neurological: Negative for weakness and numbness.     Physical Exam Updated Vital Signs BP 130/87 (BP Location: Left Arm)   Pulse 79   Temp 98.3 F (36.8 C) (Oral)   Resp 20   SpO2 98%   Physical Exam  Constitutional: He appears well-developed and well-nourished.  HENT:  Head: Atraumatic.  Neck: Normal range of motion.  Cardiovascular:  Pulses equal bilaterally  Musculoskeletal: He exhibits tenderness.       Right elbow: He exhibits decreased range of motion. He exhibits no swelling, no effusion and no deformity. Tenderness found. Olecranon process tenderness noted.  Pt with increased pain with attempted full right elbow extension.  Upper arm(bicep and triceps without deformity, no pain). No forearm, wrist and hand pain.  Neurological: He is alert. He has normal strength. He displays normal reflexes. No sensory deficit.  Equal grip strength.  Skin: Skin is warm and dry.  Psychiatric: He has a normal mood and affect.     ED Treatments / Results  Labs (all labs ordered are listed, but only abnormal results are displayed) Labs Reviewed - No data to display  EKG  EKG Interpretation None       Radiology No results found.  Procedures Procedures (including critical care time)  Medications Ordered in ED Medications  oxyCODONE (Oxy IR/ROXICODONE) immediate release tablet 10 mg (10 mg Oral Given 12/03/16 1619)     Initial Impression / Assessment and Plan / ED Course  I have reviewed the triage vital signs and the nursing notes.  Pertinent labs & imaging results that were available during my care of the patient were reviewed  by me and considered in my medical decision making (see chart for details).     Prior imaging reviewed, no indication to repeat imaging today.  Pt was prescribed a small quantity of pain medicine. Encouraged f/u with orthopedics for ongoing care. He made need closer f/u than 2 weeks if his pain is not improving sooner.  No neuro deficits today.   Final Clinical Impressions(s) / ED Diagnoses   Final diagnoses:  Closed fracture of olecranon process of right ulna with routine healing, subsequent encounter    ED Discharge Orders        Ordered    oxyCODONE (ROXICODONE) 5 MG immediate release tablet  Every 4 hours PRN     12/03/16 1611       Burgess Amor, PA-C  12/04/16 [id number]    Lavera GuiseLiu, Dana Duo, MD 12/04/16 [phone]

## 2017-01-05 ENCOUNTER — Emergency Department (HOSPITAL_COMMUNITY): Admission: EM | Admit: 2017-01-05 | Discharge: 2017-01-05 | Discharge status: Against Medical Advice | Payer: Self-pay

## 2017-01-18 ENCOUNTER — Encounter (HOSPITAL_COMMUNITY): Payer: Self-pay | Admitting: Emergency Medicine

## 2017-01-18 ENCOUNTER — Emergency Department (HOSPITAL_COMMUNITY)
Admission: EM | Admit: 2017-01-18 | Discharge: 2017-01-19 | Disposition: A | Discharge status: Home / Self Care | Payer: Self-pay | Attending: Emergency Medicine | Admitting: Emergency Medicine

## 2017-01-18 DIAGNOSIS — Z5321 Procedure and treatment not carried out due to patient leaving prior to being seen by health care provider: Secondary | ICD-10-CM | POA: Insufficient documentation

## 2017-01-18 DIAGNOSIS — F1910 Other psychoactive substance abuse, uncomplicated: Secondary | ICD-10-CM

## 2017-01-18 LAB — COMPREHENSIVE METABOLIC PANEL
ALT: 54 U/L (ref 17–63)
ANION GAP: 7 (ref 5–15)
AST: 40 U/L (ref 15–41)
Albumin: 3.6 g/dL (ref 3.5–5.0)
Alkaline Phosphatase: 46 U/L (ref 38–126)
BILIRUBIN TOTAL: 1.1 mg/dL (ref 0.3–1.2)
BUN: 23 mg/dL — AB (ref 6–20)
CHLORIDE: 105 mmol/L (ref 101–111)
CO2: 25 mmol/L (ref 22–32)
Calcium: 9.2 mg/dL (ref 8.9–10.3)
Creatinine, Ser: 1.22 mg/dL (ref 0.61–1.24)
GFR calc non Af Amer: >60 mL/min (ref >60)
Glucose, Bld: 174 mg/dL — ABNORMAL HIGH (ref 65–99)
POTASSIUM: 3.7 mmol/L (ref 3.5–5.1)
Sodium: 137 mmol/L (ref 135–145)
Total Protein: 7.6 g/dL (ref 6.5–8.1)

## 2017-01-18 LAB — CBC
HEMATOCRIT: 48.2 % (ref 39.0–52.0)
Hemoglobin: 15.8 g/dL (ref 13.0–17.0)
MCH: 32 pg (ref 26.0–34.0)
MCHC: 32.8 g/dL (ref 30.0–36.0)
MCV: 97.6 fL (ref 78.0–100.0)
Platelets: 182 10*3/uL (ref 150–400)
RBC: 4.94 MIL/uL (ref 4.22–5.81)
RDW: 14.6 % (ref 11.5–15.5)
WBC: 6.4 10*3/uL (ref 4.0–10.5)

## 2017-01-18 LAB — ETHANOL

## 2017-01-18 MED ORDER — PENICILLIN V POTASSIUM 500 MG PO TABS
500.0000 mg | ORAL_TABLET | Freq: Three times a day (TID) | ORAL | 0 refills | Status: DC
Start: 2017-01-18 — End: 2017-01-20

## 2017-01-18 NOTE — Discharge Instructions (Signed)
Please call one of the treatment facilities to request help with addiction, either inpatient or outpatient.

## 2017-01-18 NOTE — ED Notes (Signed)
Patient states he is definitely not suicidal and needs to go outside to smoke.

## 2017-01-18 NOTE — ED Notes (Signed)
Patient called to be triaged with no response from lobby.

## 2017-01-18 NOTE — ED Provider Notes (Signed)
[facility]-EMERGENCY DEPT Provider Note   CSN: [id number] Arrival date & time: 01/18/17  1512     History   Chief Complaint Chief Complaint  Patient presents with  . Detox    HPI Joshua Thomas is a 42 y.o. male.  Patient presents with request for help with addiction. He has been injecting cocaine and heroin, and abusing alcohol and requests help with treatment. No SI/HI/AVH. He states he has been up for 5 days and has not slept. He is concerned that he has been off his psychiatric medications. He also reports ongoing dental pain, concerned for infection. No nausea, vomiting, chest pain, SOB.    The history is provided by the patient. No language interpreter was used.    Past Medical History:  Diagnosis Date  . ADHD (attention deficit hyperactivity disorder)   . Bipolar 1 disorder (HCC)   . Chronic back pain   . Chronic pain    chronic l leg pain  . DDD (degenerative disc disease), lumbar   . Degenerative disc disease, lumbar   . Depression   . GERD (gastroesophageal reflux disease)   . Hepatitis C   . Hepatitis C   . History of stomach ulcers   . Sciatica   . Substance abuse [facility])     Patient Active Problem List   Diagnosis Date Noted  . Cocaine use disorder, moderate, dependence (HCC) 05/23/2015  . Opioid use disorder, moderate, dependence (HCC) 05/23/2015  . 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 Substance or medication-induced bipolar and related disorder with onset during intoxication (HCC) 05/23/2015  . Hepatitis C   . 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 FRACTURE SURGERY    . HARDWARE REMOVAL  01/27/2012   Procedure: HARDWARE REMOVAL;  Surgeon: Kathryne Hitch, MD;  Location: [facility];  Service: Orthopedics;  Laterality: Left;  . TIBIA IM NAIL INSERTION  01/27/2012   Procedure: INTRAMEDULLARY (IM) NAIL TIBIAL;  Surgeon: Kathryne Hitch, MD;  Location: [facility];  Service: Orthopedics;  Laterality: Left;  Removal of IM Rod and Screws Left Tibia with Exchange Nail, Allograft Bone Graft Left Tibia       Home Medications    Prior to Admission medications   Medication Sig Start Date End Date Taking? Authorizing Provider  DULoxetine (CYMBALTA) 30 MG capsule Take 1 capsule (30 mg total) by mouth daily. For depression 05/23/15   Armandina Stammer I, NP  metoCLOPramide (REGLAN) 10 MG tablet Take 1 tablet (10 mg total) by mouth every 6 (six) hours as needed for nausea. Patient not taking: Reported on 12/03/2016 06/15/16   Minna Antis, MD  mirtazapine (REMERON) 7.5 MG tablet Take 1 tablet (7.5 mg total) by mouth at bedtime. For sleep Patient not taking: Reported on 12/03/2016 05/23/15   Armandina Stammer I, NP  oxyCODONE (ROXICODONE) 5 MG immediate release tablet Take 1 tablet (5 mg total) by mouth every 4 (four) hours as needed for severe pain. 12/03/16   Burgess Amor, PA-C  oxyCODONE-acetaminophen (PERCOCET/ROXICET) 5-325 MG tablet Take 1 tablet every 4 (four) hours as needed by mouth. Patient not taking: Reported on 12/03/2016 11/24/16   Triplett, Tammy, PA-C  penicillin v potassium (VEETID) 500 MG tablet Take 1 tablet (500 mg total) by mouth 3 (three) times daily. 01/18/17   Elpidio Anis, PA-C  QUEtiapine (SEROQUEL) 100 MG tablet Take 100 mg by mouth 2 (two) times daily.  [provider]    Family History Family History  Problem Relation Age of Onset  . Diabetes Mother     Social History Social History   Tobacco Use  . Smoking status: Current Every Day Smoker    Packs/day: 1.00    Years: 24.00    Pack years: 24.00    Types: Cigarettes  . Smokeless tobacco: Former NeurosurgeonUser    Quit date: 08/08/2006  Substance Use Topics  . Alcohol use: Yes    Comment: liquor  . Drug use: Yes    Types: Cocaine    Comment: 48 hours ago     Allergies   Acetaminophen and Morphine and related   Review of Systems Review  of Systems  Constitutional: Negative for chills and fever.  HENT: Positive for dental problem.   Respiratory: Negative.   Cardiovascular: Negative.   Gastrointestinal: Negative.   Musculoskeletal: Negative.   Skin: Negative.   Neurological: Negative.   Psychiatric/Behavioral: Positive for dysphoric mood. Negative for hallucinations and suicidal ideas.     Physical Exam Updated Vital Signs BP (!) 130/102 (BP Location: Right Arm)   Pulse 70   Temp 98.5 F (36.9 C) (Oral)   Resp 16   SpO2 100%   Physical Exam  Constitutional: He is oriented to person, place, and time. He appears well-developed and well-nourished.  HENT:  Head: Normocephalic.  Neck: Normal range of motion. Neck supple.  Cardiovascular: Normal rate and regular rhythm.  Pulmonary/Chest: Effort normal and breath sounds normal.  Abdominal: Soft. Bowel sounds are normal. There is no tenderness. There is no rebound and no guarding.  Musculoskeletal: Normal range of motion.  Neurological: He is alert and oriented to person, place, and time.  Skin: Skin is warm and dry. He is not diaphoretic.  Multiple injection sites to bilateral UE's. No evidence of infection.  Psychiatric: He has a normal mood and affect. His speech is rapid and/or pressured. He is hyperactive. He is not actively hallucinating. He expresses no homicidal and no suicidal ideation.     ED Treatments / Results  Labs (all labs ordered are listed, but only abnormal results are displayed) Labs Reviewed  COMPREHENSIVE METABOLIC PANEL - Abnormal; Notable for the following components:      Result Value   Glucose, Bld 174 (*)    BUN 23 (*)    All other components within normal limits  ETHANOL  CBC  RAPID URINE DRUG SCREEN, HOSP PERFORMED    EKG  EKG Interpretation None       Radiology No results found.  Procedures Procedures (including critical care time)  Medications Ordered in ED Medications - No data to display   Initial Impression  / Assessment and Plan / ED Course  I have reviewed the triage vital signs and the nursing notes.  Pertinent labs & imaging results that were available during my care of the patient were reviewed by me and considered in my medical decision making (see chart for details).     Patient here with polysubstance abuse [facility] He denies emergent psychiatric issues (no SI/HI/AVH). Discussed providing resources for outpatient inpatient/outpatient treatment facilities, as well as primary care to manage his medical problems, including Hep C and previously seen lung nodules.   He is considered stable for discharge.   Final Clinical Impressions(s) / ED Diagnoses   Final diagnoses:  Polysubstance abuse [facility](HCC)    ED Discharge Orders        Ordered  penicillin v potassium (VEETID) 500 MG tablet  3 times daily     01/18/17 2328       Elpidio Anis, PA-C 01/18/17 2359    Geoffery Lyons, MD 01/19/17 [phone]

## 2017-01-18 NOTE — ED Notes (Signed)
Pt called from the lobby with no response 

## 2017-01-18 NOTE — ED Notes (Signed)
Bed: [id number] Expected date:  Expected time:  Means of arrival:  Comments: Triage

## 2017-01-18 NOTE — ED Notes (Signed)
Pt called from the lobby with no response x2 

## 2017-01-18 NOTE — ED Triage Notes (Signed)
Patient states he needs some "mental health for his problems." Pt wanting detox from "anything and everything." states he has masses on his lungs that need to be treated for these problems before getting his health in check. Patient states he does not have thoughts of suicide right now but "would chose a happy way to take himself out." 48 hours since last use of cocaine. Reports 2 pints of liquor last night. Pt adds he has been homeless for a long time and is trying to get clean to get back into his families lives.

## 2017-01-19 ENCOUNTER — Emergency Department (HOSPITAL_COMMUNITY)
Admission: EM | Admit: 2017-01-19 | Discharge: 2017-01-20 | Disposition: A | Discharge status: Home / Self Care | Payer: Self-pay | Attending: Emergency Medicine | Admitting: Emergency Medicine

## 2017-01-19 ENCOUNTER — Encounter (HOSPITAL_COMMUNITY): Payer: Self-pay | Admitting: *Deleted

## 2017-01-19 DIAGNOSIS — K0889 Other specified disorders of teeth and supporting structures: Secondary | ICD-10-CM | POA: Insufficient documentation

## 2017-01-19 DIAGNOSIS — F1410 Cocaine abuse, uncomplicated: Secondary | ICD-10-CM | POA: Insufficient documentation

## 2017-01-19 DIAGNOSIS — R45851 Suicidal ideations: Secondary | ICD-10-CM | POA: Insufficient documentation

## 2017-01-19 DIAGNOSIS — Z79899 Other long term (current) drug therapy: Secondary | ICD-10-CM | POA: Insufficient documentation

## 2017-01-19 DIAGNOSIS — F1910 Other psychoactive substance abuse, uncomplicated: Secondary | ICD-10-CM

## 2017-01-19 DIAGNOSIS — F17210 Nicotine dependence, cigarettes, uncomplicated: Secondary | ICD-10-CM | POA: Insufficient documentation

## 2017-01-19 DIAGNOSIS — F319 Bipolar disorder, unspecified: Secondary | ICD-10-CM | POA: Insufficient documentation

## 2017-01-19 DIAGNOSIS — F1414 Cocaine abuse with cocaine-induced mood disorder: Secondary | ICD-10-CM | POA: Diagnosis present

## 2017-01-19 DIAGNOSIS — F1310 Sedative, hypnotic or anxiolytic abuse, uncomplicated: Secondary | ICD-10-CM | POA: Insufficient documentation

## 2017-01-19 LAB — RAPID URINE DRUG SCREEN, HOSP PERFORMED
AMPHETAMINES: NOT DETECTED
BENZODIAZEPINES: POSITIVE — AB
Barbiturates: NOT DETECTED
Cocaine: POSITIVE — AB
Opiates: NOT DETECTED
Tetrahydrocannabinol: NOT DETECTED

## 2017-01-19 MED ORDER — NICOTINE 21 MG/24HR TD PT24
21.0000 mg | MEDICATED_PATCH | Freq: Once | TRANSDERMAL | Status: AC
  Administered 2017-01-19: 21 mg via TRANSDERMAL
  Filled 2017-01-19: qty 1

## 2017-01-19 MED ORDER — PENICILLIN V POTASSIUM 500 MG PO TABS
500.0000 mg | ORAL_TABLET | Freq: Three times a day (TID) | ORAL | Status: DC
  Administered 2017-01-19 – 2017-01-20 (×3): 500 mg via ORAL
  Filled 2017-01-19 (×3): qty 1

## 2017-01-19 MED ORDER — QUETIAPINE FUMARATE 100 MG PO TABS
100.0000 mg | ORAL_TABLET | Freq: Two times a day (BID) | ORAL | Status: DC
  Administered 2017-01-19 – 2017-01-20 (×2): 100 mg via ORAL
  Filled 2017-01-19 (×2): qty 1

## 2017-01-19 MED ORDER — DULOXETINE HCL 30 MG PO CPEP
30.0000 mg | ORAL_CAPSULE | Freq: Every day | ORAL | Status: DC
  Administered 2017-01-19 – 2017-01-20 (×2): 30 mg via ORAL
  Filled 2017-01-19 (×2): qty 1

## 2017-01-19 NOTE — ED Notes (Signed)
Pt admitted to room #41. Pt behavior cooperative, reports he is at the hospital for help with drug addiction and his tooth cavity. Encouragement and support provided. Special checks q 15 mins in place for safety, Video monitoring in place will continue to monitor.

## 2017-01-19 NOTE — Patient Outreach (Signed)
ED Peer Support Specialist Patient Intake (Complete at intake & 30-60 Day Follow-up)  Name: Joshua Thomas  MRN: [id number]  Age: 42 y.o.   Date of Admission: 01/19/2017  Intake: Initial Comments:      Primary Reason Admitted: Joshua Thomas is a 42 y.o. male  with a PMH of ADHD, bipolar disorder, chronic pain, HepC who presents to the Emergency Department complaining of suicidal thoughts and polysubstance abuse. Patient states that he has been using whatever drugs he can get his hands on. 2-3 days ago, he did a lot of cocaine. He states that he was sitting on a bridge "tweaking" thinking about suicide but did not do it because he believes in the man upstairs. He reports thinking of suicide by cop as well. Denies HI or auditory / visual hallucinations. He states that he was seen for dental infection yesterday and went to get his prescription filled. It was $7 and he could not afford this. He felt like the lady at the pharmacy was looking at him like he was crazy and this triggered him to become very angry as well    Lab values: Alcohol/ETOH: Positive Positive UDS? No Amphetamines: No Barbiturates: No Benzodiazepines: Yes Cocaine: Yes Opiates: No Cannabinoids: No  Demographic information: Gender: Male Ethnicity: White Marital Status: Single Insurance Status: Uninsured/Self-pay [employer] (Work Neurosurgeon, Physicist, medical, etc.: Yes(Food stamps ) Lives with: Alone Living situation: Homeless  Reported Patient History: Patient reported health conditions: ADD/ADHD, Bipolar disorder, Depression Patient aware of HIV and hepatitis status: (Hep C)  In past year, has patient visited ED for any reason? Yes  Number of ED visits: 1  Reason(s) for visit: Same concerns  In past year, has patient been hospitalized for any reason? No  Number of hospitalizations:    Reason(s) for hospitalization:    In past year, has patient been arrested? No  Number of  arrests:    Reason(s) for arrest:    In past year, has patient been incarcerated? No  Number of incarcerations:    Reason(s) for incarceration:    In past year, has patient received medication-assisted treatment? Yes, [facility]  In past year, patient received the following treatments:    In past year, has patient received any harm reduction services? No  Did this include any of the following?    In past year, has patient received care from a mental health provider for diagnosis other than SUD? Yes([facility])  In past year, is this first time patient has overdosed? Yes(Heroin)  Number of past overdoses:    In past year, is this first time patient has been hospitalized for an overdose? No  Number of hospitalizations for overdose(s):    Is patient currently receiving treatment for a mental health diagnosis? Yes([facility])  Patient reports experiencing difficulty participating in SUD treatment: No    Most important reason(s) for this difficulty?    Has patient received prior services for treatment? Yes([facility])  In past, patient has received services from following agencies: [facility]  Plan of Care:  Suggested follow up at these agencies/treatment centers: [facility] [place] [facility]), [facility]), [facility] [employer] [facility])  Other information: CPSS met with Pt and talked with Pt about how he is doing and to monitor services. CPSS talked with Pt to gain more information to find out services Pt wanted to look into receiving from [facility]. CPSS addressed the fact that Pt maybe able to get into [facility] treatment facility  but may have to complete a phone assessment. CPSS explained to Pt that he will be notified once CPSS hears back from facility.    Joshua Thomas Joshua Thomas, CPSS  01/19/2017 4:58 PM

## 2017-01-19 NOTE — ED Notes (Signed)
Patient denies SI/HI/AVH at this time. Plan of care discussed. Encouragement and support provided and safety maintain. Q 15 min safety checks remain in place and video monitoring. 

## 2017-01-19 NOTE — ED Notes (Signed)
Bed: [facility] Expected date:  Expected time:  Means of arrival:  Comments: 

## 2017-01-19 NOTE — ED Notes (Signed)
Pt taking a shower 

## 2017-01-19 NOTE — ED Notes (Signed)
Specimen cup provided and pt encouraged to provide urine per MD order.  

## 2017-01-19 NOTE — ED Provider Notes (Signed)
[facility]-EMERGENCY DEPT Provider Note   CSN: [id number] Arrival date & time: 01/19/17  1357     History   Chief Complaint Chief Complaint  Patient presents with  . Suicidal    HPI Joshua Thomas is a 42 y.o. male.  The history is provided by the patient and medical records. No language interpreter was used.   Joshua Thomas is a 42 y.o. male  with a PMH of ADHD, bipolar disorder, chronic pain, HepC who presents to the Emergency Department complaining of suicidal thoughts and polysubstance abuse. Patient states that he has been using whatever drugs he can get his hands on. 2-3 days ago, he did a lot of cocaine. He states that he was sitting on a bridge "tweaking" thinking about suicide but did not do it because he believes in the man upstairs. He reports thinking of suicide by cop as well. Denies HI or auditory / visual hallucinations. He states that he was seen for dental infection yesterday and went to get his prescription filled. It was $7 and he could not afford this. He felt like the lady at the pharmacy was looking at him like he was crazy and this triggered him to become very angry as well.   Past Medical History:  Diagnosis Date  . ADHD (attention deficit hyperactivity disorder)   . Bipolar 1 disorder (HCC)   . Chronic back pain   . Chronic pain    chronic l leg pain  . DDD (degenerative disc disease), lumbar   . Degenerative disc disease, lumbar   . Depression   . GERD (gastroesophageal reflux disease)   . Hepatitis C   . Hepatitis C   . History of stomach ulcers   . Sciatica   . Substance abuse [facility])     Patient Active Problem List   Diagnosis Date Noted  . Cocaine use disorder, moderate, dependence (HCC) 05/23/2015  . Opioid use disorder, moderate, dependence (HCC) 05/23/2015  . 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 Substance or medication-induced bipolar and related disorder with onset during intoxication (HCC) 05/23/2015   . Hepatitis C   . 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 FRACTURE SURGERY    . HARDWARE REMOVAL  01/27/2012   Procedure: HARDWARE REMOVAL;  Surgeon: Kathryne Hitch, MD;  Location: [facility] ORS;  Service: Orthopedics;  Laterality: Left;  . TIBIA IM NAIL INSERTION  01/27/2012   Procedure: INTRAMEDULLARY (IM) NAIL TIBIAL;  Surgeon: Kathryne Hitch, MD;  Location: [facility] ORS;  Service: Orthopedics;  Laterality: Left;  Removal of IM Rod and Screws Left Tibia with Exchange Nail, Allograft Bone Graft Left Tibia       Home Medications    Prior to Admission medications   Medication Sig Start Date End Date Taking? Authorizing Provider  Aspirin-Acetaminophen-Caffeine (GOODY HEADACHE PO) Take 1 packet by mouth daily as needed (PAIN).   Yes [provider]  DULoxetine (CYMBALTA) 30 MG capsule Take 1 capsule (30 mg total) by mouth daily. For depression 05/23/15  Yes Nwoko, Nicole Kindred I, NP  QUEtiapine (SEROQUEL) 100 MG tablet Take 100 mg by mouth 2 (two) times daily.   Yes [provider]  metoCLOPramide (REGLAN) 10 MG tablet Take 1 tablet (10 mg total) by mouth every 6 (six) hours as needed for nausea. Patient not taking: Reported on 12/03/2016 06/15/16   Minna Antis, MD  mirtazapine (REMERON) 7.5 MG  tablet Take 1 tablet (7.5 mg total) by mouth at bedtime. For sleep Patient not taking: Reported on 12/03/2016 05/23/15   Armandina Stammer I, NP  oxyCODONE (ROXICODONE) 5 MG immediate release tablet Take 1 tablet (5 mg total) by mouth every 4 (four) hours as needed for severe pain. Patient not taking: Reported on 01/19/2017 12/03/16   Burgess Amor, PA-C  oxyCODONE-acetaminophen (PERCOCET/ROXICET) 5-325 MG tablet Take 1 tablet every 4 (four) hours as needed by mouth. Patient not taking: Reported on 12/03/2016 11/24/16   Triplett, Tammy, PA-C  penicillin v potassium (VEETID) 500 MG tablet Take 1 tablet (500 mg  total) by mouth 3 (three) times daily. Patient not taking: Reported on 01/19/2017 01/18/17   Elpidio Anis, PA-C    Family History Family History  Problem Relation Age of Onset  . Diabetes Mother     Social History Social History   Tobacco Use  . Smoking status: Current Every Day Smoker    Packs/day: 1.00    Years: 24.00    Pack years: 24.00    Types: Cigarettes  . Smokeless tobacco: Former Neurosurgeon    Quit date: 08/08/2006  Substance Use Topics  . Alcohol use: Yes    Comment: liquor  . Drug use: Yes    Types: Cocaine    Comment: 48 hours ago     Allergies   Acetaminophen and Morphine and related   Review of Systems Review of Systems  HENT: Positive for dental problem. Negative for sore throat and trouble swallowing.   Psychiatric/Behavioral: Positive for suicidal ideas.  All other systems reviewed and are negative.    Physical Exam Updated Vital Signs BP 138/72 (BP Location: Left Arm)   Pulse 77   Temp 98.2 F (36.8 C) (Oral)   Resp 18   SpO2 100%   Physical Exam  Constitutional: He is oriented to person, place, and time. He appears well-developed and well-nourished. No distress.  HENT:  Head: Normocephalic and atraumatic.  Mouth/Throat:    Pain along tooth as depicted in image. No abscess noted. Midline uvula. No trismus. OP moist and clear. No oropharyngeal erythema or edema. Neck supple with no tenderness. No facial edema.  Cardiovascular: Normal rate, regular rhythm and normal heart sounds.  No murmur heard. Pulmonary/Chest: Effort normal and breath sounds normal. No respiratory distress.  Abdominal: Soft. He exhibits no distension. There is no tenderness.  Musculoskeletal: He exhibits no edema.  Neurological: He is alert and oriented to person, place, and time.  Skin: Skin is warm and dry.  Nursing note and vitals reviewed.    ED Treatments / Results  Labs (all labs ordered are listed, but only abnormal results are displayed) Labs Reviewed    RAPID URINE DRUG SCREEN, HOSP PERFORMED    EKG  EKG Interpretation None       Radiology No results found.  Procedures Procedures (including critical care time)  Medications Ordered in ED Medications  nicotine (NICODERM CQ - dosed in mg/24 hours) patch 21 mg (not administered)  DULoxetine (CYMBALTA) DR capsule 30 mg (not administered)  penicillin v potassium (VEETID) tablet 500 mg (not administered)  QUEtiapine (SEROQUEL) tablet 100 mg (not administered)     Initial Impression / Assessment and Plan / ED Course  I have reviewed the triage vital signs and the nursing notes.  Pertinent labs & imaging results that were available during my care of the patient were reviewed by me and considered in my medical decision making (see chart for details).    Nedra Hai  Alta CorningW Laurie is a 42 y.o. male who presents to ED for suicidal thoughts and dental pain. Seen in ED for dental pain and started on PenVK but could not afford rx. Will start while in ED today. No signs of ludwig's or neck space infection. No abscess. Labs were obtained yesterday and reassuring. Medically cleared with disposition per TTS recommendations.   Final Clinical Impressions(s) / ED Diagnoses   Final diagnoses:  Suicidal thoughts  Polysubstance abuse (HCC)  Pain, dental    ED Discharge Orders    None       Ward, Chase PicketJaime Pilcher, PA-C 01/19/17 1617    Benjiman CorePickering, Nathan, MD 01/20/17 [phone]

## 2017-01-19 NOTE — ED Triage Notes (Signed)
Pt states he feels like he is going to hurt himself or someone else. Pt states he cannot get food and that "something is going to give".

## 2017-01-19 NOTE — BH Assessment (Signed)
Assessment Note  Joshua Thomas is a 42 y.o. male who presented to [facility] indicating that he had thoughts of hurting himself and others and needed food. Pt was pleasant for TTS assessment. Pt indicated that he is prescribed Seroquel, Cymbalta, and Buspar by [facility], but hasn't taken them in 4-5 days b/c they are at him mother's home in [place]. He was there and got into a disagreement and asked his son to bring him to [facility], neglecting to take his medication. After speaking at length with pt, it is evident that pt is really desiring substance abuse treatment. Pt reports "always" being suicidal but says that he would never kill himself b/c "I believe in the man upstairs". Pt does mention that he had been thinking about inciting a cop to shoot him. Pt reports having "a lot" of thoughts to harm other people, but acknowledges these thoughts are in frustration of not getting the help he is asking for. Pt denies having any specific person he wants to harm. Pt denies AVH.     Diagnosis: MDD, recurrent episode, severe; Polysubstance use d/o, severe  Past Medical History:  Past Medical History:  Diagnosis Date  . ADHD (attention deficit hyperactivity disorder)   . Bipolar 1 disorder (HCC)   . Chronic back pain   . Chronic pain    chronic l leg pain  . DDD (degenerative disc disease), lumbar   . Degenerative disc disease, lumbar   . Depression   . GERD (gastroesophageal reflux disease)   . Hepatitis C   . Hepatitis C   . History of stomach ulcers   . Sciatica   . Substance abuse [facility])     Past Surgical History:  Procedure Laterality Date  . FRACTURE SURGERY    . HARDWARE REMOVAL  01/27/2012   Procedure: HARDWARE REMOVAL;  Surgeon: Kathryne Hitch, MD;  [facility];  Service: Orthopedics;  Laterality: Left;  . TIBIA IM NAIL INSERTION  01/27/2012   Procedure: INTRAMEDULLARY (IM) NAIL TIBIAL;  Surgeon: Kathryne Hitch, MD;  [facility];  Service: Orthopedics;   Laterality: Left;  Removal of IM Rod and Screws Left Tibia with Exchange Nail, Allograft Bone Graft Left Tibia    Family History:  Family History  Problem Relation Age of Onset  . Diabetes Mother     Social History:  reports that he has been smoking cigarettes.  He has a 24.00 pack-year smoking history. He quit smokeless tobacco use about 10 years ago. He reports that he drinks alcohol. He reports that he uses drugs. Drug: Cocaine.  Additional Social History:  Alcohol / Drug Use Pain Medications: see PTA list Prescriptions: see PTA list Over the Counter: see PTA list History of alcohol / drug use?: Yes Substance #1 Name of Substance 1: "whatever is the closest, cheapest, and fastest", including alcohol 1 - Age of First Use: 23 1 - Frequency: daily 1 - Duration: ongoing 1 - Last Use / Amount: 2 days ago; used IV cocaine  CIWA: CIWA-Ar BP: 138/72 Pulse Rate: 77 COWS:    Allergies:  Allergies  Allergen Reactions  . Acetaminophen Other (See Comments)    Due to Ulcers/ Liver Damage  . Morphine And Related Itching    Home Medications:  (Not in a hospital admission)  OB/GYN Status:  No LMP for male patient.  General Assessment Data [facility] of [facility]: [facility] TTS Assessment: In system Is this a Tele or Face-to-Face Assessment?: Face-to-Face Is this an Initial Assessment or a Re-assessment  for this encounter?: Initial Assessment Marital status: Single Living Arrangements: Other (Comment)(homeless) Can pt return to current living arrangement?: Yes Admission Status: Voluntary Is patient capable of signing voluntary admission?: Yes Referral Source: Self/Family/Friend Insurance type: none     Crisis Care Plan Living Arrangements: Other (Comment)(homeless) Name of Psychiatrist: Dr. Merlyn AlbertFred [facility] Name of Therapist: none  Education Status Is patient currently in school?: No  Risk to self with the past 6 months Suicidal Ideation: Yes-Currently Present Has patient  been a risk to self within the past 6 months prior to admission? : No Suicidal Intent: No Has patient had any suicidal intent within the past 6 months prior to admission? : No Is patient at risk for suicide?: No Suicidal Plan?: No Has patient had any suicidal plan within the past 6 months prior to admission? : No Access to Means: No Previous Attempts/Gestures: No Intentional Self Injurious Behavior: None Family Suicide History: Unknown Persecutory voices/beliefs?: No Depression: Yes Depression Symptoms: Feeling angry/irritable, Feeling worthless/self pity Substance abuse history and/or treatment for substance abuse?: Yes Suicide prevention information given to non-admitted patients: Not applicable  Risk to Others within the past 6 months Homicidal Ideation: Yes-Currently Present Does patient have any lifetime risk of violence toward others beyond the six months prior to admission? : Unknown Thoughts of Harm to Others: Yes-Currently Present Comment - Thoughts of Harm to Others: vague thoughts-see narrative Current Homicidal Intent: No Current Homicidal Plan: No Access to Homicidal Means: No Identified Victim: none History of harm to others?: No Assessment of Violence: None Noted Does patient have access to weapons?: No Criminal Charges Pending?: No Does patient have a court date: No Is patient on probation?: No  Psychosis Hallucinations: None noted Delusions: None noted  Mental Status Report Appearance/Hygiene: Unremarkable Eye Contact: Fair Motor Activity: Unremarkable Speech: Logical/coherent Level of Consciousness: Alert Mood: Pleasant, Despair Affect: Appropriate to circumstance Anxiety Level: Moderate Thought Processes: Coherent, Relevant Judgement: Partial Orientation: Person, Place, Time, Situation Obsessive Compulsive Thoughts/Behaviors: None  Cognitive Functioning Concentration: Normal Memory: Recent Intact, Remote Intact IQ: Average Insight:  Fair Impulse Control: Poor Appetite: Good Sleep: No Change Vegetative Symptoms: None  ADLScreening [facility] Assessment Services) Patient's cognitive ability adequate to safely complete daily activities?: Yes Patient able to express need for assistance with ADLs?: Yes Independently performs ADLs?: Yes (appropriate for developmental age)  Prior Inpatient Therapy Prior Inpatient Therapy: Yes Prior Therapy Dates: 2017 [facility] Reason for Treatment: depression; substance use  Prior Outpatient Therapy Prior Outpatient Therapy: No Does patient have an ACCT team?: No Does patient have Intensive In-House Services?  : No Does patient have [facility] services? : Yes Does patient have P4CC services?: No  ADL Screening (condition at time of admission) Patient's cognitive ability adequate to safely complete daily activities?: Yes Is the patient deaf or have difficulty hearing?: No Does the patient have difficulty seeing, even when wearing glasses/contacts?: No Does the patient have difficulty concentrating, remembering, or making decisions?: No Patient able to express need for assistance with ADLs?: Yes Does the patient have difficulty dressing or bathing?: No Independently performs ADLs?: Yes (appropriate for developmental age) Does the patient have difficulty walking or climbing stairs?: No Weakness of Legs: None Weakness of Arms/Hands: None  Home Assistive Devices/Equipment Home Assistive Devices/Equipment: None    Abuse/Neglect Assessment (Assessment to be complete while patient is alone) Abuse/Neglect Assessment Can Be Completed: Yes Physical Abuse: Denies Verbal Abuse: Denies Sexual Abuse: Denies Exploitation of patient/patient's resources: Denies Self-Neglect: Denies Values / Beliefs Cultural Requests  During Hospitalization: None Spiritual Requests During Hospitalization: None   Advance Directives (For Healthcare) Does Patient Have a Medical  Advance Directive?: No Would patient like information on creating a medical advance directive?: No - Patient declined    Additional Information 1:1 In Past 12 Months?: No CIRT Risk: No Elopement Risk: No Does patient have medical clearance?: Yes     Disposition:  Disposition Initial Assessment Completed for this Encounter: Yes(consulted with Elta Guadeloupe, NP) Disposition of Patient: Re-evaluation by Psychiatry recommended(Pt recommended to be observed overnight for safety. )  On Site Evaluation by:   Reviewed with Physician:    Laddie Aquas 01/19/2017 5:06 PM

## 2017-01-20 ENCOUNTER — Emergency Department (HOSPITAL_COMMUNITY): Admission: EM | Admit: 2017-01-20 | Discharge: 2017-01-20 | Discharge status: Against Medical Advice | Payer: Self-pay

## 2017-01-20 DIAGNOSIS — F1414 Cocaine abuse with cocaine-induced mood disorder: Secondary | ICD-10-CM | POA: Diagnosis present

## 2017-01-20 DIAGNOSIS — F1310 Sedative, hypnotic or anxiolytic abuse, uncomplicated: Secondary | ICD-10-CM

## 2017-01-20 DIAGNOSIS — F17210 Nicotine dependence, cigarettes, uncomplicated: Secondary | ICD-10-CM

## 2017-01-20 DIAGNOSIS — Z79899 Other long term (current) drug therapy: Secondary | ICD-10-CM

## 2017-01-20 DIAGNOSIS — R45851 Suicidal ideations: Secondary | ICD-10-CM

## 2017-01-20 MED ORDER — DULOXETINE HCL 30 MG PO CPEP: 30.0000 mg | ORAL_CAPSULE | Freq: Every day | ORAL | 0 refills | Status: DC

## 2017-01-20 MED ORDER — PENICILLIN V POTASSIUM 500 MG PO TABS: 500.0000 mg | ORAL_TABLET | Freq: Three times a day (TID) | ORAL | 0 refills | Status: DC

## 2017-01-20 MED ORDER — QUETIAPINE FUMARATE 100 MG PO TABS: 100.0000 mg | ORAL_TABLET | Freq: Two times a day (BID) | ORAL | 0 refills | Status: DC

## 2017-01-20 NOTE — BH Assessment (Signed)
[facility]  Per Juanetta BeetsJacqueline Norman, DO, this pt does not require psychiatric hospitalization at this time.  [facility] for [facility] [place] programs [place].  This has been included in pt's discharge instructions.  Pt would also benefit from seeing Peer Support Specialists; they will be asked to speak to pt.  Pt's nurse, Aram BeechamCynthia, has been notified.  Doylene Canninghomas Cathyrn Deas, MA Triage Specialist [phone]

## 2017-01-20 NOTE — ED Notes (Signed)
Patient is concerned that [facility]  He was told to come to the front of the ED tomorrow at 12:00 when [facility] by [facility] support.  [place] and [place] [facility] [place].  Patient does not have a phone for shelters to call him back.

## 2017-01-20 NOTE — ED Notes (Signed)
Pt called for triage. Not visualized in lobby.

## 2017-01-20 NOTE — Discharge Instructions (Signed)
To help you maintain a sober lifestyle, a substance abuse treatment program may be beneficial to you.  Contact one of the following facilities at your earliest opportunity to ask about enrolling:  RESIDENTIAL PROGRAMS:       [facility]      [address]      [place], [place] [address]      [phone]       [facility]      [address]      [place], [place] [address]      [phone]  OUTPATIENT PROGRAMS:       [facility] [address], [place]      [address] [place] [address]      [place], [place] [address]      [phone]

## 2017-01-20 NOTE — [facility] Suicide Risk Assessment (Signed)
Suicide Risk Assessment  Discharge Assessment   [facility] Dis[facility] Suicide [facility]   Principal Problem: Cocaine abuse with cocaine-induced mood disorder [facility](HCC) Discharge Diagnoses:  Patient Active Problem List   Diagnosis Date Noted  . Cocaine abuse with cocaine-induced mood disorder [facility](HCC) [F14.14] 01/20/2017    Priority: High  . 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 Cocaine use disorder, moderate, dependence (HCC) [F14.20] 05/23/2015  . Opioid use disorder, moderate, dependence (HCC) [F11.20] 05/23/2015  . 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 Substance or medication-induced bipolar and related disorder with onset during intoxication (HCC) [Z61.096[F19.929, F19.94] 05/23/2015  . Hepatitis C [B19.20]   . 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 He appears well-developed and well-nourished.  HENT:  Head: Normocephalic.  Neck: Normal range of motion.  Respiratory: Effort normal.  Musculoskeletal: Normal range of motion.  Neurological: He is alert and oriented to person, place, and time.  Psychiatric: He has a normal mood and affect. His speech is normal and behavior is normal. Judgment and thought content normal. Cognition and memory are normal.    Review of Systems  Psychiatric/Behavioral: Positive for substance abuse.  All other systems reviewed and are negative.   Blood pressure 112/70, pulse 79, temperature 98.2 F (36.8 C), temperature source Oral, resp. rate 16, SpO2 99 %.There is no height or weight on file to calculate BMI.  General Appearance: Casual  Eye Contact:  Good  Speech:  Normal Rate  Volume:  Normal  Mood:  Euthymic   Affect:  Congruent  Thought Process:  Coherent and Descriptions of Associations: Intact  Orientation:  Full (Time, Place, and Person)  Thought Content:  WDL and Logical  Suicidal Thoughts:  No  Homicidal Thoughts:  No  Memory:  Immediate;   Good Recent;   Good Remote;   Good  Judgement:  Fair  Insight:  Fair  Psychomotor Activity:  Normal  Concentration:  Concentration: Good and Attention Span: Good  Recall:  Good  Fund of Knowledge:  Fair  Language:  Good  Akathisia:  No  Handed:  Right  AIMS (if indicated):     Assets:  Leisure Time Physical Health Resilience  ADL's:  Intact  Cognition:  WNL  Sleep:       Mental Status Per Nursing Assessment::   On Admission:   cocaine and benzodiazepine abuse  Demographic Factors:  Male and Caucasian  Loss Factors: NA  Historical Factors: NA  Risk Reduction Factors:   Sense of responsibility to family  Continued Clinical Symptoms:  None   Cognitive Features That Contribute To Risk:  None    Suicide Risk:  Minimal: No identifiable suicidal ideation.  Patients presenting with no risk factors but with morbid ruminations; may be classified as minimal risk based on the severity of the depressive symptoms    Plan Of Care/Follow-up recommendations:  Activity:  as tolerated  Diet:  heart healthy diet  LORD, JAMISON, NP 01/20/2017, 11:26 AM

## 2017-01-20 NOTE — Consult Note (Signed)
[facility] Face-to-Face Psychiatry Consult   Reason for Consult:  Cocaine abuse with suicidal/homicidal ideations Referring Physician:  EDP Patient Identification: Joshua Thomas MRN:  [id number] Principal Diagnosis: Cocaine abuse with cocaine-induced mood disorder [facility]) Diagnosis:   Patient Active Problem List   Diagnosis Date Noted  . Cocaine abuse with cocaine-induced mood disorder [facility]) [F14.14] 01/20/2017    Priority: High  . 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 Cocaine use disorder, moderate, dependence (HCC) [F14.20] 05/23/2015  . Opioid use disorder, moderate, dependence (HCC) [F11.20] 05/23/2015  . 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 Substance or medication-induced bipolar and related disorder with onset during intoxication (HCC) [W09.811, F19.94] 05/23/2015  . Hepatitis C [B19.20]   . 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 male patient admitted to the ER with complaits of suicidal thoughts and ongoing polysubstance abuse that he would like help stoping. Patient states that he has been using whatever drugs he can get his hands on for quite some time. Patient stated yesterday that he was sitting on a bridge "tweaking" and thinking about suicide but was unable to jump citing his faith. This morning patient denies suicidal ideation, and thoughts of hurting himself or others. Patient denies taking any medications previously for his mood other than using street drugs. Patient was seen by Peer Support Personnel and they were able to get him a bed in their facility for substance use detox. Patient feels that he will benefit from this added resource.   Risk to Self:None Risk to Others: None  Prior Inpatient Therapy: Prior Inpatient Therapy: Yes Prior Therapy Dates: 2017 Prior Therapy Facilty/Provider(s): [facility] Reason for  Treatment: depression; substance use Prior Outpatient Therapy: Prior Outpatient Therapy: No Does patient have an ACCT team?: No Does patient have Intensive In-House Services?  : No Does patient have [facility] services? : Yes Does patient have P4CC services?: No  Past Medical History:  Past Medical History:  Diagnosis Date  . ADHD (attention deficit hyperactivity disorder)   . Bipolar 1 disorder (HCC)   . Chronic back pain   . Chronic pain    chronic l leg pain  . DDD (degenerative disc disease), lumbar   . Degenerative disc disease, lumbar   . Depression   . GERD (gastroesophageal reflux disease)   . Hepatitis C   . Hepatitis C   . History of stomach ulcers   . Sciatica   . Substance abuse [facility])     Past Surgical History:  Procedure Laterality Date  . FRACTURE SURGERY    . HARDWARE REMOVAL  01/27/2012   Procedure: HARDWARE REMOVAL;  Surgeon: Kathryne Hitch, MD;  Location: [facility];  Service: Orthopedics;  Laterality: Left;  . TIBIA IM NAIL INSERTION  01/27/2012   Procedure: INTRAMEDULLARY (IM) NAIL TIBIAL;  Surgeon: Kathryne Hitch, MD;  Location: [facility];  Service: Orthopedics;  Laterality: Left;  Removal of IM Rod and Screws Left Tibia with Exchange Nail, Allograft Bone Graft Left Tibia   Family History:  Family History  Problem Relation Age of Onset  . Diabetes Mother    Family Psychiatric  History: substance abuse Social History:  Social History   Substance and Sexual Activity  Alcohol Use Yes   Comment: liquor     Social History   Substance and Sexual Activity  Drug Use Yes  .  Types: Cocaine   Comment: 48 hours ago    Social History   Socioeconomic History  . Marital status: Single    Spouse name: None  . Number of children: None  . Years of education: None  . Highest education level: None  Social Needs  . Financial resource strain: None  . Food insecurity - worry: None  . Food insecurity - inability: None  . Transportation needs -  medical: None  . Transportation needs - non-medical: None  Occupational History  . None  Tobacco Use  . Smoking status: Current Every Day Smoker    Packs/day: 1.00    Years: 24.00    Pack years: 24.00    Types: Cigarettes  . Smokeless tobacco: Former Neurosurgeon    Quit date: 08/08/2006  Substance and Sexual Activity  . Alcohol use: Yes    Comment: liquor  . Drug use: Yes    Types: Cocaine    Comment: 48 hours ago  . Sexual activity: None  Other Topics Concern  . None  Social History Narrative  . None      Allergies:   Allergies  Allergen Reactions  . Acetaminophen Other (See Comments)    Due to Ulcers/ Liver Damage  . Morphine And Related Itching    Labs:  Results for orders placed or performed during the hospital encounter of 01/19/17 (from the past 48 hour(s))  Urine rapid drug screen (hosp performed)     Status: Abnormal   Collection Time: 01/19/17  6:30 PM  Result Value Ref Range   Opiates NONE DETECTED NONE DETECTED   Cocaine POSITIVE (A) NONE DETECTED   Benzodiazepines POSITIVE (A) NONE DETECTED   Amphetamines NONE DETECTED NONE DETECTED   Tetrahydrocannabinol NONE DETECTED NONE DETECTED   Barbiturates NONE DETECTED NONE DETECTED    Comment: (NOTE) DRUG SCREEN FOR MEDICAL PURPOSES ONLY.  IF CONFIRMATION IS NEEDED FOR ANY PURPOSE, NOTIFY LAB WITHIN 5 DAYS. LOWEST DETECTABLE LIMITS FOR URINE DRUG SCREEN Drug Class                     Cutoff (ng/mL) Amphetamine and metabolites    1000 Barbiturate and metabolites    200 Benzodiazepine                 200 Tricyclics and metabolites     300 Opiates and metabolites        300 Cocaine and metabolites        300 THC                            50     Current Facility-Administered Medications  Medication Dose Route Frequency Provider Last Rate Last Dose  . DULoxetine (CYMBALTA) DR capsule 30 mg  30 mg Oral Daily Ward, Chase Picket, PA-C   30 mg at 01/20/17 1035  . nicotine (NICODERM CQ - dosed in mg/24 hours)  patch 21 mg  21 mg Transdermal Once Ward, Chase Picket, PA-C   21 mg at 01/19/17 1701  . penicillin v potassium (VEETID) tablet 500 mg  500 mg Oral TID Ward, Chase Picket, PA-C   500 mg at 01/20/17 1035  . QUEtiapine (SEROQUEL) tablet 100 mg  100 mg Oral BID Ward, Chase Picket, PA-C   100 mg at 01/20/17 1035   Current Outpatient Medications  Medication Sig Dispense Refill  . Aspirin-Acetaminophen-Caffeine (GOODY HEADACHE PO) Take 1 packet by mouth daily as needed (PAIN).    Marland Kitchen  DULoxetine (CYMBALTA) 30 MG capsule Take 1 capsule (30 mg total) by mouth daily. For depression 30 capsule 0  . QUEtiapine (SEROQUEL) 100 MG tablet Take 100 mg by mouth 2 (two) times daily.    . metoCLOPramide (REGLAN) 10 MG tablet Take 1 tablet (10 mg total) by mouth every 6 (six) hours as needed for nausea. (Patient not taking: Reported on 12/03/2016) 20 tablet 0  . mirtazapine (REMERON) 7.5 MG tablet Take 1 tablet (7.5 mg total) by mouth at bedtime. For sleep (Patient not taking: Reported on 12/03/2016) 30 tablet 0  . oxyCODONE (ROXICODONE) 5 MG immediate release tablet Take 1 tablet (5 mg total) by mouth every 4 (four) hours as needed for severe pain. (Patient not taking: Reported on 01/19/2017) 20 tablet 0  . oxyCODONE-acetaminophen (PERCOCET/ROXICET) 5-325 MG tablet Take 1 tablet every 4 (four) hours as needed by mouth. (Patient not taking: Reported on 12/03/2016) 15 tablet 0  . penicillin v potassium (VEETID) 500 MG tablet Take 1 tablet (500 mg total) by mouth 3 (three) times daily. (Patient not taking: Reported on 01/19/2017) 30 tablet 0    Musculoskeletal: Strength & Muscle Tone: within normal limits Gait & Station: normal Patient leans: N/A  Psychiatric Specialty Exam: Physical Exam  Nursing note and vitals reviewed. Constitutional: He is oriented to person, place, and time. He appears well-developed and well-nourished.  HENT:  Head: Normocephalic.  Neck: Normal range of motion.  Respiratory: Effort normal.   Musculoskeletal: Normal range of motion.  Neurological: He is alert and oriented to person, place, and time.  Psychiatric: He has a normal mood and affect. His speech is normal and behavior is normal. Judgment and thought content normal. Cognition and memory are normal.    Review of Systems  Psychiatric/Behavioral: Positive for substance abuse.  All other systems reviewed and are negative.   Blood pressure 112/70, pulse 79, temperature 98.2 F (36.8 C), temperature source Oral, resp. rate 16, SpO2 99 %.There is no height or weight on file to calculate BMI.  General Appearance: Casual  Eye Contact:  Good  Speech:  Normal Rate  Volume:  Normal  Mood:  Dysphoric  Affect:  Congruent  Thought Process:  Coherent and Descriptions of Associations: Intact  Orientation:  Full (Time, Place, and Person)  Thought Content:  WDL and Logical  Suicidal Thoughts:  No  Homicidal Thoughts:  No  Memory:  Immediate;   Good Recent;   Good Remote;   Good  Judgement:  Fair  Insight:  Fair  Psychomotor Activity:  Normal  Concentration:  Concentration: Good and Attention Span: Good  Recall:  Good  Fund of Knowledge:  Fair  Language:  Good  Akathisia:  No  Handed:  Right  AIMS (if indicated):   N/A  Assets:  Leisure Time Physical Health Resilience  ADL's:  Intact  Cognition:  WNL  Sleep:   N/A     Treatment Plan Summary: Daily contact with patient to assess and evaluate symptoms and progress in treatment, Medication management and Plan cocaine abuse with cocaine induced mood disorder:  -Crisis stabilization -Medication management:  Continued Cymbalta 30 mg daily for depression and Seroquel 100 mg BID for mood, Penicillin for infection. -Individual counseling  Disposition: No evidence of imminent risk to self or others at present.    Nanine Means, NP 01/20/2017 11:21 AM   Patient seen face-to-face for psychiatric evaluation, chart reviewed and case discussed with the physician extender and  developed treatment plan. Reviewed the information documented and agree with  the treatment plan.  Juanetta BeetsJacqueline Norman, DO

## 2017-01-20 NOTE — ED Notes (Signed)
Bryan with peer support is going to meet patient outside to make sure [facility].

## 2017-01-20 NOTE — ED Notes (Signed)
Pt called for triage. No answer.

## 2017-01-20 NOTE — ED Notes (Signed)
Called for triage. No answer. 

## 2017-01-21 ENCOUNTER — Encounter (HOSPITAL_COMMUNITY): Payer: Self-pay | Admitting: Emergency Medicine

## 2017-01-21 ENCOUNTER — Emergency Department (HOSPITAL_COMMUNITY)
Admission: EM | Admit: 2017-01-21 | Discharge: 2017-01-21 | Disposition: A | Discharge status: Home / Self Care | Payer: Self-pay | Attending: Emergency Medicine | Admitting: Emergency Medicine

## 2017-01-21 DIAGNOSIS — Z79899 Other long term (current) drug therapy: Secondary | ICD-10-CM | POA: Insufficient documentation

## 2017-01-21 DIAGNOSIS — K0889 Other specified disorders of teeth and supporting structures: Secondary | ICD-10-CM | POA: Insufficient documentation

## 2017-01-21 DIAGNOSIS — F17210 Nicotine dependence, cigarettes, uncomplicated: Secondary | ICD-10-CM | POA: Insufficient documentation

## 2017-01-21 MED ORDER — BUPIVACAINE-EPINEPHRINE (PF) 0.5% -1:200000 IJ SOLN
1.8000 mL | Freq: Once | INTRAMUSCULAR | Status: AC
  Administered 2017-01-21: 1.8 mL
  Filled 2017-01-21: qty 1.8

## 2017-01-21 NOTE — ED Notes (Signed)
Bed: WTR7 Expected date:  Expected time:  Means of arrival:  Comments: 

## 2017-01-21 NOTE — ED Notes (Signed)
When patient called from lobby to come back to treatment room, patient eating with out any difficulty.

## 2017-01-21 NOTE — Discharge Instructions (Signed)
Follow-up with a dentist.  Return to the ED with any difficulties breathing or swallowing, fevers, worsening pain.  Take the antibiotics that you were given 1 week ago.

## 2017-01-21 NOTE — ED Triage Notes (Signed)
Patient c/o right upper dental pain for month.

## 2017-01-21 NOTE — Progress Notes (Signed)
Per CPSS, [facility] at 1:30PM- CSW went to speak with patient regarding discharge plans and patient was not in room. CSW will notify CPSS, Cletis AthensJon, [facility].   Stacy GardnerErin Leslyn Monda, [facility] Emergency Room Clinical Social Worker [phone]

## 2017-01-21 NOTE — ED Provider Notes (Signed)
[facility]-EMERGENCY DEPT Provider Note   CSN: [id number] Arrival date & time: 01/21/17  0741     History   Chief Complaint Chief Complaint  Patient presents with  . Dental Pain    HPI Joshua Thomas is a 42 y.o. male.  HPI 42 year old Caucasian male past medical history significant for chronic pain, GERD, hepatitis C, substance abuse, alcohol abuse presents to the emergency department today with complaints of right upper dental pain.  Patient states that the pain is been ongoing for the past month.  Patient was seen in the ED prior to several weeks ago for same.  At that time he was given a penicillin prescription which he did not get filled.  Patient states that his pain is not due to infection it is due to the tooth.  Patient denies any known trauma.  He does not have a Education officer, communitydentist.  States that they $150 that he cannot afford at this time.  Patient is requesting that I give him numbing medication for the toothache.  Patient has also requested that I give him a prescription for pain medicine until he can see a "dentist" on Monday.  Patient denies any fevers, chills, difficulty breathing, difficulty swallowing.  He is not taking for the pain prior to arrival.  Patient is currently eating a [employer] biscuit.  Patient states that palpation, moving and eating make the pain worse. Past Medical History:  Diagnosis Date  . ADHD (attention deficit hyperactivity disorder)   . Bipolar 1 disorder (HCC)   . Chronic back pain   . Chronic pain    chronic l leg pain  . DDD (degenerative disc disease), lumbar   . Degenerative disc disease, lumbar   . Depression   . GERD (gastroesophageal reflux disease)   . Hepatitis C   . Hepatitis C   . History of stomach ulcers   . Sciatica   . Substance abuse [facility](HCC)     Patient Active Problem List   Diagnosis Date Noted  . Cocaine abuse with cocaine-induced mood disorder (HCC) 01/20/2017  . Benzodiazepine abuse (HCC) 01/20/2017  .  Cocaine use disorder, moderate, dependence (HCC) 05/23/2015  . Opioid use disorder, moderate, dependence (HCC) 05/23/2015  . 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 Substance or medication-induced bipolar and related disorder with onset during intoxication (HCC) 05/23/2015  . Hepatitis C   . 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 FRACTURE SURGERY    . HARDWARE REMOVAL  01/27/2012   Procedure: HARDWARE REMOVAL;  Surgeon: Kathryne Hitchhristopher Y Blackman, MD;  Location: [facility];  Service: Orthopedics;  Laterality: Left;  . TIBIA IM NAIL INSERTION  01/27/2012   Procedure: INTRAMEDULLARY (IM) NAIL TIBIAL;  Surgeon: Kathryne Hitchhristopher Y Blackman, MD;  [facility];  Service: Orthopedics;  Laterality: Left;  Removal of IM Rod and Screws Left Tibia with Exchange Nail, Allograft Bone Graft Left Tibia       Home Medications    Prior to Admission medications   Medication Sig Start Date End Date Taking? Authorizing Provider  DULoxetine (CYMBALTA) 30 MG capsule Take 1 capsule (30 mg total) by mouth daily. For depression 01/20/17   Charm RingsLord, Jamison Y, NP  penicillin v potassium (VEETID) 500 MG tablet Take 1 tablet (500 mg total) by mouth 3 (three) times daily. 01/20/17   Charm RingsLord, Jamison Y, NP  QUEtiapine (SEROQUEL) 100 MG tablet Take 1 tablet (100 mg total)  by mouth 2 (two) times daily. 01/20/17   Charm Rings, NP    Family History Family History  Problem Relation Age of Onset  . Diabetes Mother     Social History Social History   Tobacco Use  . Smoking status: Current Every Day Smoker    Packs/day: 1.00    Years: 24.00    Pack years: 24.00    Types: Cigarettes  . Smokeless tobacco: Former Neurosurgeon    Quit date: 08/08/2006  Substance Use Topics  . Alcohol use: Yes    Comment: liquor  . Drug use: Yes    Types: Cocaine    Comment: 48 hours ago     Allergies   Acetaminophen and Morphine and  related   Review of Systems Review of Systems  Constitutional: Negative for chills and fever.  HENT: Positive for dental problem. Negative for trouble swallowing.   Respiratory: Negative for shortness of breath.   Gastrointestinal: Negative for vomiting.  Skin: Negative for rash.     Physical Exam Updated Vital Signs BP (!) 154/98 (BP Location: Right Arm)   Pulse 68   Temp 98.1 F (36.7 C) (Oral)   Resp 17   SpO2 100%   Physical Exam  Constitutional: He appears well-developed and well-nourished. No distress.  HENT:  Head: Normocephalic and atraumatic.  Mouth/Throat:    Pain along tooth as depicted in image. No abscess noted. Midline uvula. No trismus. OP moist and clear. No oropharyngeal erythema or edema. Neck supple with no tenderness. No facial edema.  Managing secretions tolerating airway.  Speaking in complete sentences.  Eyes: Right eye exhibits no discharge. Left eye exhibits no discharge. No scleral icterus.  Neck: Normal range of motion. Neck supple.  Pulmonary/Chest: No respiratory distress.  Musculoskeletal: Normal range of motion.  Lymphadenopathy:    He has no cervical adenopathy.  Neurological: He is alert.  Skin: No pallor.  Psychiatric: His behavior is normal. Judgment and thought content normal.  Nursing note and vitals reviewed.    ED Treatments / Results  Labs (all labs ordered are listed, but only abnormal results are displayed) Labs Reviewed - No data to display  EKG  EKG Interpretation None       Radiology No results found.  Procedures Dental Block Date/Time: 01/21/2017 10:14 AM Performed by: Rise Mu, PA-C Authorized by: Rise Mu, PA-C   Consent:    Consent obtained:  Verbal   Consent given by:  Patient   Risks discussed:  Allergic reaction, hematoma, intravascular injection, infection, nerve damage, pain, unsuccessful block and swelling   Alternatives discussed:  No treatment Indications:     Indications: dental pain   Location:    Block type:  Supraperiosteal   Supraperiosteal location:  Upper teeth   Upper teeth location:  3/RU 1st molar Procedure details (see MAR for exact dosages):    Needle gauge:  27 G   Anesthetic injected:  Bupivacaine 0.25% WITH epi Post-procedure details:    Outcome:  Pain relieved   Patient tolerance of procedure:  Tolerated well, no immediate complications   (including critical care time)  Medications Ordered in ED Medications  bupivacaine-epinephrine (MARCAINE W/ EPI) 0.5% -1:200000 injection 1.8 mL (not administered)     Initial Impression / Assessment and Plan / ED Course  I have reviewed the triage vital signs and the nursing notes.  Pertinent labs & imaging results that were available during my care of the patient were reviewed by me and considered in my medical  decision making (see chart for details).     Patient with toothache.  No gross abscess.  Exam unconcerning for Ludwig's angina or spread of infection.  Patient given penicillin last week which she still has the prescription in hand but states he would not get this filled because it is not an infection.  I encouraged patient to fill the prescription.  Have also encouraged him to follow-up with a dentist.  Patient states that he cannot afford a dentist however he is asking for narcotic pain medication which I have informed him that we do not prescribe for dental pain.  This needs to be followed up with a dentist.  Urged patient to follow-up with dentist.     Final Clinical Impressions(s) / ED Diagnoses   Final diagnoses:  Pain, dental    ED Discharge Orders    None       Wallace Keller 01/21/17 1015    Nira Conn, MD 01/22/17 1256

## 2017-01-28 ENCOUNTER — Emergency Department (HOSPITAL_COMMUNITY)
Admission: EM | Admit: 2017-01-28 | Discharge: 2017-01-28 | Disposition: A | Discharge status: Home / Self Care | Payer: Self-pay | Attending: Emergency Medicine | Admitting: Emergency Medicine

## 2017-01-28 ENCOUNTER — Encounter (HOSPITAL_COMMUNITY): Payer: Self-pay | Admitting: *Deleted

## 2017-01-28 ENCOUNTER — Other Ambulatory Visit: Payer: Self-pay

## 2017-01-28 DIAGNOSIS — Z79899 Other long term (current) drug therapy: Secondary | ICD-10-CM | POA: Insufficient documentation

## 2017-01-28 DIAGNOSIS — K0889 Other specified disorders of teeth and supporting structures: Secondary | ICD-10-CM | POA: Insufficient documentation

## 2017-01-28 DIAGNOSIS — F17210 Nicotine dependence, cigarettes, uncomplicated: Secondary | ICD-10-CM | POA: Insufficient documentation

## 2017-01-28 MED ORDER — PENICILLIN V POTASSIUM 500 MG PO TABS: 500.0000 mg | ORAL_TABLET | Freq: Three times a day (TID) | ORAL | 0 refills | Status: DC

## 2017-01-28 MED ORDER — BUPIVACAINE HCL 0.5 % IJ SOLN
50.0000 mL | Freq: Once | INTRAMUSCULAR | Status: AC
  Administered 2017-01-28: 50 mL
  Filled 2017-01-28: qty 50

## 2017-01-28 NOTE — ED Triage Notes (Signed)
Toothache for 3-4 days severe pain

## 2017-01-28 NOTE — Discharge Instructions (Signed)
Please follow-up with one of the dental clinics provided to you below or in your paperwork. Call and tell them you were seen in the Emergency Dept and arrange for an appointment. You may have to call multiple places in order to find a place to be seen.  Dental Assistance If the dentist on-call cannot see you, please use the resources below:   Patients with Medicaid: [facility] [phone], [phone] [address], [phone]  If unable to pay, or uninsured, contact [facility] [phone]) or [facility] [phone] in [place], [phone] in [place] [place]) to become qualified for the adult dental clinic  Other Low-Cost Community Dental Services: [facility]- [address], [place], [place], [address]    [phone], Ext. [phone]    2nd and 4th Thursday of the month at 6:30am    10 clients each day by appointment, can sometimes see walk-in     patients if someone does not show for an appointment [facility]- [address], [place], [place], [address]    [phone] [facility]- [address], [place], [place], [address]    [phone]  [facility]- [phone] [facility]- [phone] [phone]      Take the medications as directed.   You can take Tylenol or Ibuprofen as directed for pain.  The exam and treatment you received today has been provided on an emergency basis only. This is not a substitute for complete medical or dental care. If your problem worsens or new symptoms (problems) appear, and you are unable to arrange prompt follow-up care with your dentist, call or return to this location. If you do not have a dentist, please follow-up with one on the list provided  CALL YOUR DENTIST OR RETURN IMMEDIATELY IF you develop a fever, rash, difficulty breathing or swallowing, neck or facial swelling, or other potentially serious  concerns.

## 2017-01-28 NOTE — ED Provider Notes (Signed)
[facility] Provider Note   CSN: [id number] Arrival date & time: 01/28/17  1653     History   Chief Complaint Chief Complaint  Patient presents with  . Dental Pain    HPI Joshua Thomas is a 42 y.o. male medical history of chronic pain, degenerative disc disease, otitis C, substance abuse who presents for evaluation of 1 week of right lower dental pain.  Patient reports that pain is progressively worsened over the last week.  He has been taking Goody powder with no improvement in symptoms.  He states he has been able to eat and drink without any difficulty.  He has not had any difficulty swallowing his secretions.  He states that the pain radiates to the right lower face but denies any redness or swelling to the face.  He has not had any fevers.  Patient denies any difficulty breathing, fevers, chest pain, neck pain.  The history is provided by the patient.    Past Medical History:  Diagnosis Date  . ADHD (attention deficit hyperactivity disorder)   . Bipolar 1 disorder (HCC)   . Chronic back pain   . Chronic pain    chronic l leg pain  . DDD (degenerative disc disease), lumbar   . Degenerative disc disease, lumbar   . Depression   . GERD (gastroesophageal reflux disease)   . Hepatitis C   . Hepatitis C   . History of stomach ulcers   . Sciatica   . Substance abuse [facility](HCC)     Patient Active Problem List   Diagnosis Date Noted  . Cocaine abuse with cocaine-induced mood disorder (HCC) 01/20/2017  . Benzodiazepine abuse (HCC) 01/20/2017  . Cocaine use disorder, moderate, dependence (HCC) 05/23/2015  . Opioid use disorder, moderate, dependence (HCC) 05/23/2015  . 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 Substance or medication-induced bipolar and related disorder with onset during intoxication (HCC) 05/23/2015  . Hepatitis C   . 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 FRACTURE SURGERY    . HARDWARE REMOVAL  01/27/2012   Procedure: HARDWARE REMOVAL;  Surgeon: Kathryne Hitchhristopher Y Blackman, MD;  Location: [facility];  Service: Orthopedics;  Laterality: Left;  . TIBIA IM NAIL INSERTION  01/27/2012   Procedure: INTRAMEDULLARY (IM) NAIL TIBIAL;  Surgeon: Kathryne Hitchhristopher Y Blackman, MD;  Location: [facility];  Service: Orthopedics;  Laterality: Left;  Removal of IM Rod and Screws Left Tibia with Exchange Nail, Allograft Bone Graft Left Tibia       Home Medications    Prior to Admission medications   Medication Sig Start Date End Date Taking? Authorizing Provider  DULoxetine (CYMBALTA) 30 MG capsule Take 1 capsule (30 mg total) by mouth daily. For depression 01/20/17   Charm RingsLord, Jamison Y, NP  penicillin v potassium (VEETID) 500 MG tablet Take 1 tablet (500 mg total) by mouth 3 (three) times daily. 01/28/17   Maxwell CaulLayden, Breeanne Oblinger A, PA-C  QUEtiapine (SEROQUEL) 100 MG tablet Take 1 tablet (100 mg total) by mouth 2 (two) times daily. 01/20/17   Charm RingsLord, Jamison Y, NP    Family History Family History  Problem Relation Age of Onset  . Diabetes Mother     Social History Social History   Tobacco Use  . Smoking status: Current Every Day Smoker    Packs/day: 1.00    Years: 24.00    Pack years: 24.00  Types: Cigarettes  . Smokeless tobacco: Former Neurosurgeon    Quit date: 08/08/2006  Substance Use Topics  . Alcohol use: Yes    Comment: liquor  . Drug use: Yes    Types: Cocaine    Comment: 48 hours ago     Allergies   Acetaminophen and Morphine and related   Review of Systems Review of Systems  Constitutional: Negative for fever.  HENT: Positive for dental problem. Negative for facial swelling and trouble swallowing.   Respiratory: Negative for shortness of breath.   Cardiovascular: Negative for chest pain.     Physical Exam Updated Vital Signs BP (!) 144/104   Pulse 71   Temp 98.4 F (36.9 C)   Resp 18   Ht 5\' 3"  (1.6 m)   Wt 72.6 kg (160  lb)   SpO2 100%   BMI 28.34 kg/m   Physical Exam  Constitutional: He appears well-developed and well-nourished.  HENT:  Head: Normocephalic and atraumatic.  Poor dentition overall.  Patient has scattered dental caries throughout all male.  He has several missing teeth.  Patient reporting pain to the right back molar.  There is no surrounding gingival erythema or swelling.  No fluctuance or mass noted.  No identifiable dental abscess.  Uvula is midline.  No facial or neck swelling.  Airway patent.  Phonation intact.  Eyes: Conjunctivae and EOM are normal. Right eye exhibits no discharge. Left eye exhibits no discharge. No scleral icterus.  Pulmonary/Chest: Effort normal.  Neurological: He is alert.  Skin: Skin is warm and dry.  Psychiatric: He has a normal mood and affect. His speech is normal and behavior is normal.  Nursing note and vitals reviewed.    ED Treatments / Results  Labs (all labs ordered are listed, but only abnormal results are displayed) Labs Reviewed - No data to display  EKG  EKG Interpretation None       Radiology No results found.  Procedures Procedures (including critical care time)  Medications Ordered in ED Medications  bupivacaine (MARCAINE) 0.5 % (with pres) injection 50 mL (not administered)     Initial Impression / Assessment and Plan / ED Course  I have reviewed the triage vital signs and the nursing notes.  Pertinent labs & imaging results that were available during my care of the patient were reviewed by me and considered in my medical decision making (see chart for details).     42 y.o. yo  presents with 1 week of dental pain. Patient is afebrile, non-toxic appearing, sitting comfortably on examination table. Vital signs reviewed and stable. No evidence of abscess requiring immediate incision and drainage. Exam not concerning for Ludwig's angina or pharyngeal abscess. Patient requesting a dental block.   Dental block as documented  above. Patient tolerated procedure well. Will treat with antibiotic therapy . Patient with no known drug allergies. Patient instructed to follow-up with dentist referral provided. Stable for discharge at this time. Strict return precautions discussed. Patient expresses understanding and agreement to plan.   Final Clinical Impressions(s) / ED Diagnoses   Final diagnoses:  Pain, dental    ED Discharge Orders        Ordered    penicillin v potassium (VEETID) 500 MG tablet  3 times daily     01/28/17 2110       Rosana Hoes 01/28/17 2140    Eber Hong, MD 01/29/17 [phone]

## 2017-05-01 ENCOUNTER — Emergency Department (HOSPITAL_COMMUNITY)
Admission: EM | Admit: 2017-05-01 | Discharge: 2017-05-01 | Disposition: A | Discharge status: Home / Self Care | Payer: Self-pay | Attending: Emergency Medicine | Admitting: Emergency Medicine

## 2017-05-01 ENCOUNTER — Other Ambulatory Visit: Payer: Self-pay

## 2017-05-01 ENCOUNTER — Encounter (HOSPITAL_COMMUNITY): Payer: Self-pay | Admitting: Emergency Medicine

## 2017-05-01 DIAGNOSIS — F17210 Nicotine dependence, cigarettes, uncomplicated: Secondary | ICD-10-CM | POA: Insufficient documentation

## 2017-05-01 DIAGNOSIS — L0291 Cutaneous abscess, unspecified: Secondary | ICD-10-CM

## 2017-05-01 DIAGNOSIS — Z79899 Other long term (current) drug therapy: Secondary | ICD-10-CM | POA: Insufficient documentation

## 2017-05-01 DIAGNOSIS — L02412 Cutaneous abscess of left axilla: Secondary | ICD-10-CM | POA: Insufficient documentation

## 2017-05-01 MED ORDER — TRAMADOL HCL 50 MG PO TABS
100.0000 mg | ORAL_TABLET | Freq: Once | ORAL | Status: AC
  Administered 2017-05-01: 100 mg via ORAL
  Filled 2017-05-01: qty 2

## 2017-05-01 MED ORDER — IBUPROFEN 600 MG PO TABS: 600.0000 mg | ORAL_TABLET | Freq: Four times a day (QID) | ORAL | 0 refills | Status: DC

## 2017-05-01 MED ORDER — TRAMADOL HCL 50 MG PO TABS: ORAL_TABLET | ORAL | 0 refills | Status: DC

## 2017-05-01 MED ORDER — DOXYCYCLINE HYCLATE 100 MG PO CAPS: 100.0000 mg | ORAL_CAPSULE | Freq: Two times a day (BID) | ORAL | 0 refills | Status: DC

## 2017-05-01 MED ORDER — CEPHALEXIN 500 MG PO CAPS
500.0000 mg | ORAL_CAPSULE | Freq: Once | ORAL | Status: AC
  Administered 2017-05-01: 500 mg via ORAL
  Filled 2017-05-01: qty 1

## 2017-05-01 MED ORDER — ONDANSETRON HCL 4 MG PO TABS
4.0000 mg | ORAL_TABLET | Freq: Once | ORAL | Status: AC
  Administered 2017-05-01: 4 mg via ORAL
  Filled 2017-05-01: qty 1

## 2017-05-01 MED ORDER — DOXYCYCLINE HYCLATE 100 MG PO TABS
100.0000 mg | ORAL_TABLET | Freq: Once | ORAL | Status: AC
  Administered 2017-05-01: 100 mg via ORAL
  Filled 2017-05-01: qty 1

## 2017-05-01 MED ORDER — IBUPROFEN 800 MG PO TABS
800.0000 mg | ORAL_TABLET | Freq: Once | ORAL | Status: AC
  Administered 2017-05-01: 800 mg via ORAL
  Filled 2017-05-01: qty 1

## 2017-05-01 NOTE — ED Notes (Signed)
Pt ambulatory to waiting room. Pt verbalized understanding of discharge instructions.   

## 2017-05-01 NOTE — Discharge Instructions (Addendum)
Use warm Epson salt soaks to your left under arm for about 15 minutes daily.  Please refrain from scratching the area, or squeezing the areas.  Use doxycycline 2 times daily with food.  Use ibuprofen with rectus, lunch, dinner, and at bedtime for inflammation and pain.  May use Ultram for more severe pain.  This medication may cause drowsiness, please use it with caution.  See your primary physician or return to the emergency department if any emergent changes in your condition, problems, or concerns.

## 2017-05-01 NOTE — ED Triage Notes (Signed)
Pt c/o tick bite under each arm x 1 week ago

## 2017-05-01 NOTE — ED Provider Notes (Signed)
[facility] EMERGENCY DEPARTMENT Provider Note   CSN: [id number] Arrival date & time: 05/01/17  1905     History   Chief Complaint Chief Complaint  Patient presents with  . Insect Bite    ticks    HPI Joshua Thomas is a 42 y.o. male.  Patient is a 42 year old male who presents to the emergency department with a complaint of sores under his arm.  The patient states that this problem started approximately a week ago.  He thought that he was perhaps having a reaction to deodorant, and the course of scratching he had some mild blood on his finger, and noticed after he washed his finger that there was a small tick present.  The patient had a second area that was also raised and causing pain.  It started to drain and then he went to squeeze it to get even more material out of it.  He states that since that time he is been having increasing pain.  He states that it is keeping him up at night.  He is no longer having drainage.  He is not had any fever or chills to be reported.  He presents now for assistance with this issue, he is also concerned because he has a history of hepatitis C.     Past Medical History:  Diagnosis Date  . ADHD (attention deficit hyperactivity disorder)   . Bipolar 1 disorder (HCC)   . Chronic back pain   . Chronic pain    chronic l leg pain  . DDD (degenerative disc disease), lumbar   . Degenerative disc disease, lumbar   . Depression   . GERD (gastroesophageal reflux disease)   . Hepatitis C   . Hepatitis C   . History of stomach ulcers   . Sciatica   . Substance abuse [facility])     Patient Active Problem List   Diagnosis Date Noted  . Cocaine abuse with cocaine-induced mood disorder (HCC) 01/20/2017  . Benzodiazepine abuse (HCC) 01/20/2017  . Cocaine use disorder, moderate, dependence (HCC) 05/23/2015  . Opioid use disorder, moderate, dependence (HCC) 05/23/2015  . 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 Substance or medication-induced  bipolar and related disorder with onset during intoxication (HCC) 05/23/2015  . Hepatitis C   . 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 FRACTURE SURGERY    . HARDWARE REMOVAL  01/27/2012   Procedure: HARDWARE REMOVAL;  Surgeon: Kathryne Hitch, MD;  [facility];  Service: Orthopedics;  Laterality: Left;  . TIBIA IM NAIL INSERTION  01/27/2012   Procedure: INTRAMEDULLARY (IM) NAIL TIBIAL;  Surgeon: Kathryne Hitch, MD;  Location: [facility];  Service: Orthopedics;  Laterality: Left;  Removal of IM Rod and Screws Left Tibia with Exchange Nail, Allograft Bone Graft Left Tibia        Home Medications    Prior to Admission medications   Medication Sig Start Date End Date Taking? Authorizing Provider  DULoxetine (CYMBALTA) 30 MG capsule Take 1 capsule (30 mg total) by mouth daily. For depression 01/20/17   Charm Rings, NP  penicillin v potassium (VEETID) 500 MG tablet Take 1 tablet (500 mg total) by mouth 3 (three) times daily. 01/28/17   Maxwell Caul, PA-C  QUEtiapine (SEROQUEL) 100 MG tablet Take 1 tablet (100 mg total) by mouth 2 (two) times daily. 01/20/17   Charm Rings, NP  Family History Family History  Problem Relation Age of Onset  . Diabetes Mother     Social History Social History   Tobacco Use  . Smoking status: Current Every Day Smoker    Packs/day: 1.00    Years: 24.00    Pack years: 24.00    Types: Cigarettes  . Smokeless tobacco: Former Neurosurgeon    Quit date: 08/08/2006  Substance Use Topics  . Alcohol use: Yes    Comment: liquor  . Drug use: Not Currently    Types: Cocaine    Comment: last used few mos     Allergies   Acetaminophen and Morphine and related   Review of Systems Review of Systems  Constitutional: Negative for activity change.       All ROS Neg except as noted in HPI  HENT: Negative for nosebleeds.   Eyes: Negative for photophobia and  discharge.  Respiratory: Negative for cough, shortness of breath and wheezing.   Cardiovascular: Negative for chest pain and palpitations.  Gastrointestinal: Negative for abdominal pain and blood in stool.  Genitourinary: Negative for dysuria, frequency and hematuria.  Musculoskeletal: Negative for arthralgias, back pain and neck pain.       Abscess under the arm  Skin: Negative.   Neurological: Negative for dizziness, seizures and speech difficulty.  Psychiatric/Behavioral: Negative for confusion and hallucinations.     Physical Exam Updated Vital Signs BP 120/83 (BP Location: Right Arm)   Pulse 82   Temp 98.4 F (36.9 C) (Oral)   Resp 16   Wt 72.6 kg (160 lb)   SpO2 100%   BMI 28.34 kg/m   Physical Exam  Constitutional: He is oriented to person, place, and time. He appears well-developed and well-nourished.  Non-toxic appearance.  HENT:  Head: Normocephalic.  Right Ear: Tympanic membrane and external ear normal.  Left Ear: Tympanic membrane and external ear normal.  Eyes: Pupils are equal, round, and reactive to light. EOM and lids are normal.  Neck: Normal range of motion. Neck supple. Carotid bruit is not present.  Cardiovascular: Normal rate, regular rhythm, normal heart sounds, intact distal pulses and normal pulses.  Pulmonary/Chest: Breath sounds normal. No respiratory distress.  Abdominal: Soft. Bowel sounds are normal. There is no tenderness. There is no guarding.  Musculoskeletal: Normal range of motion.  Lymphadenopathy:       Head (right side): No submandibular adenopathy present.       Head (left side): No submandibular adenopathy present.    He has no cervical adenopathy.  Neurological: He is alert and oriented to person, place, and time. He has normal strength. No cranial nerve deficit or sensory deficit.  Skin: Skin is warm and dry.  2 small red raised areas in the left axilla.  There is no red streaking appreciated.  The areas are tender to touch.  There  are some red scratched areas just below the 2 abscess areas.  There is no palpable nodes in the bicep tricep area or in the axilla.  Psychiatric: He has a normal mood and affect. His speech is normal.  Nursing note and vitals reviewed.    ED Treatments / Results  Labs (all labs ordered are listed, but only abnormal results are displayed) Labs Reviewed - No data to display  EKG None  Radiology No results found.  Procedures Procedures (including critical care time)  Medications Ordered in ED Medications  cephALEXin (KEFLEX) capsule 500 mg (has no administration in time range)  doxycycline (VIBRA-TABS) tablet 100 mg (has  no administration in time range)  ondansetron (ZOFRAN) tablet 4 mg (has no administration in time range)  ibuprofen (ADVIL,MOTRIN) tablet 800 mg (has no administration in time range)  traMADol (ULTRAM) tablet 100 mg (has no administration in time range)     Initial Impression / Assessment and Plan / ED Course  I have reviewed the triage vital signs and the nursing notes.  Pertinent labs & imaging results that were available during my care of the patient were reviewed by me and considered in my medical decision making (see chart for details).       Final Clinical Impressions(s) / ED Diagnoses MDM  Vital signs are within normal limits.  Patient has what I believe to be resolving abscess areas of the left axilla.  There is no evidence of cellulitis.  I have asked the patient to use warm Epson salt soaks daily until the issue resolves.  Patient will be placed on doxycycline 2 times daily with food.  Patient will use ibuprofen with breakfast, lunch, dinner, and at bedtime.  Patient is given 10 tablets of Ultram to use for more severe pain.  He will return to the emergency department or see the primary physician if any changes in condition, problems, or concerns.   Final diagnoses:  Abscess    ED Discharge Orders        Ordered    traMADol (ULTRAM) 50 MG  tablet     05/01/17 2108    doxycycline (VIBRAMYCIN) 100 MG capsule  2 times daily     05/01/17 2108    ibuprofen (ADVIL,MOTRIN) 600 MG tablet  4 times daily     05/01/17 2108       Ivery QualeBryant, Devesh Monforte, PA-C 05/01/17 2111    Samuel JesterMcManus, Kathleen, DO 05/02/17 2349

## 2017-05-15 ENCOUNTER — Emergency Department (HOSPITAL_COMMUNITY)
Admission: EM | Admit: 2017-05-15 | Discharge: 2017-05-15 | Disposition: A | Discharge status: Home / Self Care | Payer: Self-pay | Attending: Emergency Medicine | Admitting: Emergency Medicine

## 2017-05-15 ENCOUNTER — Emergency Department (HOSPITAL_COMMUNITY): Payer: Self-pay

## 2017-05-15 ENCOUNTER — Encounter (HOSPITAL_COMMUNITY): Payer: Self-pay | Admitting: Emergency Medicine

## 2017-05-15 DIAGNOSIS — F17210 Nicotine dependence, cigarettes, uncomplicated: Secondary | ICD-10-CM | POA: Insufficient documentation

## 2017-05-15 DIAGNOSIS — F909 Attention-deficit hyperactivity disorder, unspecified type: Secondary | ICD-10-CM | POA: Insufficient documentation

## 2017-05-15 DIAGNOSIS — F319 Bipolar disorder, unspecified: Secondary | ICD-10-CM | POA: Insufficient documentation

## 2017-05-15 DIAGNOSIS — M545 Low back pain, unspecified: Secondary | ICD-10-CM

## 2017-05-15 DIAGNOSIS — Z79899 Other long term (current) drug therapy: Secondary | ICD-10-CM | POA: Insufficient documentation

## 2017-05-15 DIAGNOSIS — F1120 Opioid dependence, uncomplicated: Secondary | ICD-10-CM | POA: Insufficient documentation

## 2017-05-15 DIAGNOSIS — F1410 Cocaine abuse, uncomplicated: Secondary | ICD-10-CM | POA: Insufficient documentation

## 2017-05-15 DIAGNOSIS — G8929 Other chronic pain: Secondary | ICD-10-CM | POA: Insufficient documentation

## 2017-05-15 DIAGNOSIS — M62830 Muscle spasm of back: Secondary | ICD-10-CM | POA: Insufficient documentation

## 2017-05-15 MED ORDER — KETOROLAC TROMETHAMINE 30 MG/ML IJ SOLN
30.0000 mg | Freq: Once | INTRAMUSCULAR | Status: DC
  Filled 2017-05-15: qty 1

## 2017-05-15 MED ORDER — CYCLOBENZAPRINE HCL 10 MG PO TABS
10.0000 mg | ORAL_TABLET | Freq: Once | ORAL | Status: AC
  Administered 2017-05-15: 10 mg via ORAL
  Filled 2017-05-15: qty 1

## 2017-05-15 MED ORDER — IBUPROFEN 800 MG PO TABS: 800.0000 mg | ORAL_TABLET | Freq: Three times a day (TID) | ORAL | 0 refills | Status: DC

## 2017-05-15 MED ORDER — KETOROLAC TROMETHAMINE 30 MG/ML IJ SOLN
30.0000 mg | Freq: Once | INTRAMUSCULAR | Status: AC
  Administered 2017-05-15: 30 mg via INTRAMUSCULAR
  Filled 2017-05-15: qty 1

## 2017-05-15 MED ORDER — KETOROLAC TROMETHAMINE 30 MG/ML IJ SOLN
30.0000 mg | Freq: Once | INTRAMUSCULAR | Status: AC
Start: 2017-05-15 — End: 2017-05-15
  Administered 2017-05-15: 30 mg via INTRAMUSCULAR

## 2017-05-15 MED ORDER — CYCLOBENZAPRINE HCL 10 MG PO TABS
5.0000 mg | ORAL_TABLET | Freq: Once | ORAL | Status: AC
  Administered 2017-05-15: 5 mg via ORAL
  Filled 2017-05-15: qty 1

## 2017-05-15 MED ORDER — CYCLOBENZAPRINE HCL 5 MG PO TABS: 5.0000 mg | ORAL_TABLET | Freq: Two times a day (BID) | ORAL | 0 refills | Status: DC | PRN

## 2017-05-15 MED ORDER — METHOCARBAMOL 500 MG PO TABS: 500.0000 mg | ORAL_TABLET | Freq: Two times a day (BID) | ORAL | 0 refills | Status: DC

## 2017-05-15 MED ORDER — NAPROXEN 500 MG PO TABS: 500.0000 mg | ORAL_TABLET | Freq: Two times a day (BID) | ORAL | 0 refills | Status: DC
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
## 2017-05-15 NOTE — ED Notes (Signed)
Ride called for pt to be picked up
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
## 2017-05-15 NOTE — ED Notes (Addendum)
Pt was wheeled out to car and waited with patient until in car safely with sister. Refused to sign.

## 2017-05-15 NOTE — Discharge Instructions (Addendum)
Do not drive while taking the muscle relaxer as it will make you sleepy. Call [facility] and Wellness to schedule follow up.

## 2017-05-15 NOTE — ED Notes (Signed)
Discharge instructions have been discussed with patient. Patient verbally understands. Signature pad unavailable. Patient refusing to allow staff to transfer to wheelchair. EDP aware. Security notified.

## 2017-05-15 NOTE — ED Notes (Signed)
Staff attempting to console pt, pt continues to yell.

## 2017-05-15 NOTE — ED Provider Notes (Signed)
[facility] EMERGENCY DEPARTMENT Provider Note   CSN: [id number] Arrival date & time: 05/15/17  4098  History   Chief Complaint Chief Complaint  Patient presents with  . Back Pain    Chronic    HPI Joshua Thomas is a 42 y.o. male with a past medical history of chronic back pain, substance abuse, hepatitis C who presents to the ED with complaints of back pain.   He reports the pain started last night after falling down the last 3-4 steps of a set of stairs due to missing a step. No prodromal sx leading to fall. He states the pain was mild after onset but has progressively worsened and is now constant, unable to describe,  increased pain with any movement or coughing. No LOC or head trauma or other injuries reported as a result of his fall. He denies bowel incontinence, tingling isolated to R pinky toe. Feels his muscles to his lower back are tight.     Past Medical History:  Diagnosis Date  . ADHD (attention deficit hyperactivity disorder)   . Bipolar 1 disorder (HCC)   . Chronic back pain   . Chronic pain    chronic l leg pain  . DDD (degenerative disc disease), lumbar   . Degenerative disc disease, lumbar   . Depression   . GERD (gastroesophageal reflux disease)   . Hepatitis C   . Hepatitis C   . History of stomach ulcers   . Sciatica   . Substance abuse [facility])     Patient Active Problem List   Diagnosis Date Noted  . Cocaine abuse with cocaine-induced mood disorder (HCC) 01/20/2017  . Benzodiazepine abuse (HCC) 01/20/2017  . Cocaine use disorder, moderate, dependence (HCC) 05/23/2015  . Opioid use disorder, moderate, dependence (HCC) 05/23/2015  . 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 Substance or medication-induced bipolar and related disorder with onset during intoxication (HCC) 05/23/2015  . Hepatitis C   . 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  FRACTURE SURGERY    . HARDWARE REMOVAL  01/27/2012   Procedure: HARDWARE REMOVAL;  Surgeon: Kathryne Hitch, MD;  Location: [facility];  Service: Orthopedics;  Laterality: Left;  . TIBIA IM NAIL INSERTION  01/27/2012   Procedure: INTRAMEDULLARY (IM) NAIL TIBIAL;  Surgeon: Kathryne Hitch, MD;  Location: [facility];  Service: Orthopedics;  Laterality: Left;  Removal of IM Rod and Screws Left Tibia with Exchange Nail, Allograft Bone Graft Left Tibia        Home Medications    Prior to Admission medications   Medication Sig Start Date End Date Taking? Authorizing Provider  cyclobenzaprine (FLEXERIL) 5 MG tablet Take 1 tablet (5 mg total) by mouth 2 (two) times daily as needed for muscle spasms. 05/15/17   Ginger Carne, MD  doxycycline (VIBRAMYCIN) 100 MG capsule Take 1 capsule (100 mg total) by mouth 2 (two) times daily. 05/01/17   Ivery Quale, PA-C  DULoxetine (CYMBALTA) 30 MG capsule Take 1 capsule (30 mg total) by mouth daily. For depression 01/20/17   Charm Rings, NP  ibuprofen (ADVIL,MOTRIN) 800 MG tablet Take 1 tablet (800 mg total) by mouth 3 (three) times daily. 05/15/17   Ginger Carne, MD  penicillin v potassium (VEETID) 500 MG tablet Take 1 tablet (500 mg total) by mouth 3 (three) times daily. 01/28/17   Maxwell Caul, PA-C  QUEtiapine (SEROQUEL) 100 MG  tablet Take 1 tablet (100 mg total) by mouth 2 (two) times daily. 01/20/17   Charm Rings, NP  traMADol Janean Sark) 50 MG tablet 1 or 2 po q6h prn pain 05/01/17   Ivery Quale, PA-C    Family History Family History  Problem Relation Age of Onset  . Diabetes Mother     Social History Social History   Tobacco Use  . Smoking status: Current Every Day Smoker    Packs/day: 1.00    Years: 24.00    Pack years: 24.00    Types: Cigarettes  . Smokeless tobacco: Former Neurosurgeon    Quit date: 08/08/2006  Substance Use Topics  . Alcohol use: Yes    Comment: liquor  . Drug use: Not Currently    Types: Cocaine    Comment:  last used few mos     Allergies   Acetaminophen and Morphine and related   Review of Systems Review of Systems  Constitutional: Negative for fever.  Genitourinary: Negative for difficulty urinating.  Musculoskeletal: Positive for back pain.  Skin: Negative for wound.  Neurological: Negative for syncope, weakness and numbness.     Physical Exam Updated Vital Signs BP (!) 144/104 (BP Location: Left Arm)   Pulse 84   Temp 98.3 F (36.8 C) (Oral)   Resp (!) 22   SpO2 100%   General: Laying uncomfortably on ED stretcher, spontaneously moving about bed  Head: Normocephalic, atraumatic Eyes: PERRL, EOMI ENT: Moist mucus membranes  CV: RRR Resp: Normal work of breathing, no distress  Abd: Soft, +BS, non-tender  Extr: No LE weakness and full ROM, though reported pain with testing--note, able to move spontaneously without prompting more than elicited on exam. TTP of bilateral lumbar paraspinal muscles, no midline stepoffs or deformity   Neuro: Alert and oriented x3, LE sensation intact  Skin: Warm, dry      ED Treatments / Results  Labs (all labs ordered are listed, but only abnormal results are displayed) Labs Reviewed - No data to display  EKG None  Radiology No results found.  Procedures Procedures (including critical care time)  Medications Ordered in ED Medications  cyclobenzaprine (FLEXERIL) tablet 5 mg (has no administration in time range)  ketorolac (TORADOL) 30 MG/ML injection 30 mg (30 mg Intramuscular Given 05/15/17 0748)     Initial Impression / Assessment and Plan / ED Course  I have reviewed the triage vital signs and the nursing notes.  Pertinent labs & imaging results that were available during my care of the patient were reviewed by me and considered in my medical decision making (see chart for details).  42 year old male history of chronic back pain, substance abuse presenting with acute on chronic back pain after a fall.  Low concern for  fracture given historical elements (no significant pain immediately following) and exam.  Patient somewhat histrionic while in ED compared to exam.  No neurologic deficits concerning for cauda equina or acute disc herniation.  Treated with IM Toradol, pt sleeping comfortably on re-evaluation following this, though still reports pain. Suspect lumbar strain. Will discharge with instructions for NSAIDs, Tylenol, small supply of cyclobenzaprine. Expectant management provided. Discussed case with Dr. Denton Lank.   Final Clinical Impressions(s) / ED Diagnoses   Final diagnoses:  Chronic bilateral low back pain without sciatica    ED Discharge Orders        Ordered    ibuprofen (ADVIL,MOTRIN) 800 MG tablet  3 times daily     05/15/17 0904    cyclobenzaprine (  FLEXERIL) 5 MG tablet  2 times daily PRN     05/15/17 0904       Ginger Carne, MD 05/15/17 1307    Cathren Laine, MD 05/15/17 [phone]

## 2017-05-15 NOTE — ED Notes (Signed)
Pt resting comfortably, snoring at this time.

## 2017-05-15 NOTE — ED Notes (Addendum)
Patient is currently screaming loudly stating that he is in a lot of pain and unable to help the volume at which he is yelling. Pt wants a muscle relaxer. Security present.

## 2017-05-15 NOTE — ED Notes (Signed)
Called ride again who states they are now on their way and to have patient out in the front of the ED waiting

## 2017-05-15 NOTE — ED Notes (Signed)
Pt yelling out in pain, reports that he cannot move. Pt is moving from chair onto floor and back into chair. Pain medications given and calling sister to come pick pt up, per pt's request.

## 2017-05-15 NOTE — ED Notes (Signed)
Requested patient to place place himself back in chair, as this writer does not want patient to fall. Pt continues to be up and down on floor and bed.

## 2017-05-15 NOTE — ED Triage Notes (Signed)
Patient arrived with EMS from home reports worsening chronic low back pain onset last  night , denies recent injury / no urinary discomfort , pain increases with movement /changing positions .

## 2017-05-15 NOTE — Discharge Instructions (Addendum)
Nice to meet you Joshua Thomas.  The characteristics of your pain and your exam are reassuring for any bone or nerve damage from your fall. This was likely a muscle strain with associated tightness and soreness to the muscles of your lower back. The best treatment for this is anti-inflammatories with ibuprofen (800 mg three times a day)-provided a prescription for this high strength ibuprofen- and Tylenol (1000 mg four times a day), as well as ice/heat to the area. We also prescribed a small supply of muscle relaxers to help with the next day or two when it may be most sore. We also recommend establishing with a primary care doctor to help manage your pain and for routine health care.

## 2017-05-15 NOTE — ED Notes (Signed)
Attempting to dc patient. Patient cursing at this RN. Asking to see EDP before dc. EDP aware

## 2017-05-15 NOTE — ED Notes (Signed)
Pt yelling in hallways- "where is my fucking nurse?! I need my fucking doctor! Something is wrong with me!"

## 2017-05-16 ENCOUNTER — Encounter (HOSPITAL_COMMUNITY): Payer: Self-pay | Admitting: Emergency Medicine

## 2017-05-16 ENCOUNTER — Other Ambulatory Visit: Payer: Self-pay

## 2017-05-16 ENCOUNTER — Emergency Department (HOSPITAL_COMMUNITY)
Admission: EM | Admit: 2017-05-16 | Discharge: 2017-05-17 | Disposition: A | Discharge status: Home / Self Care | Payer: Self-pay | Attending: Emergency Medicine | Admitting: Emergency Medicine

## 2017-05-16 ENCOUNTER — Emergency Department (HOSPITAL_COMMUNITY)
Admission: EM | Admit: 2017-05-16 | Discharge: 2017-05-16 | Disposition: A | Discharge status: Home / Self Care | Payer: Self-pay | Attending: Emergency Medicine | Admitting: Emergency Medicine

## 2017-05-16 DIAGNOSIS — M545 Low back pain: Secondary | ICD-10-CM | POA: Insufficient documentation

## 2017-05-16 DIAGNOSIS — Z7289 Other problems related to lifestyle: Secondary | ICD-10-CM | POA: Insufficient documentation

## 2017-05-16 DIAGNOSIS — Z765 Malingerer [conscious simulation]: Secondary | ICD-10-CM

## 2017-05-16 DIAGNOSIS — Z5321 Procedure and treatment not carried out due to patient leaving prior to being seen by health care provider: Secondary | ICD-10-CM | POA: Insufficient documentation

## 2017-05-16 DIAGNOSIS — M549 Dorsalgia, unspecified: Secondary | ICD-10-CM | POA: Insufficient documentation

## 2017-05-16 DIAGNOSIS — F17210 Nicotine dependence, cigarettes, uncomplicated: Secondary | ICD-10-CM | POA: Insufficient documentation

## 2017-05-16 DIAGNOSIS — F1910 Other psychoactive substance abuse, uncomplicated: Secondary | ICD-10-CM | POA: Insufficient documentation

## 2017-05-16 DIAGNOSIS — N3000 Acute cystitis without hematuria: Secondary | ICD-10-CM | POA: Insufficient documentation

## 2017-05-16 DIAGNOSIS — G8929 Other chronic pain: Secondary | ICD-10-CM | POA: Insufficient documentation

## 2017-05-16 LAB — URINALYSIS, ROUTINE W REFLEX MICROSCOPIC
BACTERIA UA: NONE SEEN
Bilirubin Urine: NEGATIVE
Glucose, UA: NEGATIVE mg/dL
KETONES UR: 20 mg/dL — AB
Nitrite: NEGATIVE
PH: 6 (ref 5.0–8.0)
Protein, ur: 100 mg/dL — AB
Specific Gravity, Urine: 1.014 (ref 1.005–1.030)

## 2017-05-16 LAB — RAPID URINE DRUG SCREEN, HOSP PERFORMED
Amphetamines: NOT DETECTED
BARBITURATES: NOT DETECTED
BENZODIAZEPINES: POSITIVE — AB
COCAINE: POSITIVE — AB
OPIATES: NOT DETECTED
Tetrahydrocannabinol: NOT DETECTED

## 2017-05-16 MED ORDER — LIDOCAINE 5 % EX PTCH
2.0000 | MEDICATED_PATCH | CUTANEOUS | Status: DC
  Administered 2017-05-16: 2 via TRANSDERMAL
  Filled 2017-05-16: qty 2

## 2017-05-16 MED ORDER — LIDOCAINE 5 % EX PTCH: 1.0000 | MEDICATED_PATCH | CUTANEOUS | 0 refills | Status: DC

## 2017-05-16 MED ORDER — CEPHALEXIN 500 MG PO CAPS
500.0000 mg | ORAL_CAPSULE | Freq: Once | ORAL | Status: AC
  Administered 2017-05-16: 500 mg via ORAL
  Filled 2017-05-16: qty 1

## 2017-05-16 MED ORDER — KETOROLAC TROMETHAMINE 30 MG/ML IJ SOLN
15.0000 mg | Freq: Once | INTRAMUSCULAR | Status: AC
  Administered 2017-05-16: 15 mg via INTRAVENOUS
  Filled 2017-05-16: qty 1

## 2017-05-16 MED ORDER — PREDNISONE 20 MG PO TABS
60.0000 mg | ORAL_TABLET | Freq: Once | ORAL | Status: AC
  Administered 2017-05-16: 60 mg via ORAL
  Filled 2017-05-16: qty 3

## 2017-05-16 MED ORDER — CEPHALEXIN 500 MG PO CAPS: 500.0000 mg | ORAL_CAPSULE | Freq: Four times a day (QID) | ORAL | 0 refills | Status: DC

## 2017-05-16 NOTE — ED Notes (Signed)
Pt ambulating to different areas of the ED, pt was found near Rm 4, pt had ambulated without assistance.

## 2017-05-16 NOTE — ED Triage Notes (Signed)
Pt comes via [facility] for back pain from a fall two days ago down two steps, pain in lower back, seen at [facility] yesterday.

## 2017-05-16 NOTE — ED Notes (Signed)
Pt ambulated to sink in pt room and drank water from the sink, per pt.

## 2017-05-16 NOTE — ED Provider Notes (Addendum)
Patient has walked into the doctor's office 3 times.  He was informed he cannot do this as it is a HIPPAA violation.  Patient is becoming aggressive with staff and threatening. He is stable for discharge and appropriate referrals have been made.  Informed patient wants narcotic RX and refused to sign for discharge paperwork because he did not get the medication he wanted.        Joshua Primo, MD 05/16/17 [phone]

## 2017-05-16 NOTE — ED Notes (Signed)
Writer spoke with Gabriel Rung, RN [facility] to update about phone call from brother wanting to speak with CEO and has video of pt "crawling to car"

## 2017-05-16 NOTE — ED Notes (Signed)
Pt aware a urine specimen is needed.  

## 2017-05-16 NOTE — ED Notes (Signed)
Brother in law who has called and admitted to videoing pt and care provided is is calling form Edmonia Lynch [phone] and another number [phone]

## 2017-05-16 NOTE — ED Provider Notes (Addendum)
[facility]-EMERGENCY DEPT Provider Note   CSN: [id number] Arrival date & time: 05/16/17  0009     History   Chief Complaint Chief Complaint  Patient presents with  . Back Pain    HPI Joshua Thomas is a 42 y.o. male.  The history is provided by the patient.  Back Pain   This is a chronic problem. The current episode started more than 1 week ago. The problem occurs constantly. Progression since onset: worse following falling down 3 stairs.  No weakness no numbness no bowel or bladder incontinence.   The pain is associated with falling. The pain is present in the lumbar spine. The quality of the pain is described as stabbing. The pain does not radiate. The pain is severe. The symptoms are aggravated by bending. Pertinent negatives include no chest pain, no fever, no numbness, no weight loss, no headaches, no abdominal pain, no abdominal swelling, no bowel incontinence, no perianal numbness, no bladder incontinence, no dysuria, no pelvic pain, no leg pain, no paresthesias, no paresis, no tingling and no weakness. Treatments tried: fentanyl, ketorlac, did not fill RX given this am. The treatment provided no relief. Risk factors: none.  Patient with a h/o polysubstance abuse and overdose, hep C and chronic back and leg pain presents with ongoing pain.  He was discharged from [facility] following violent outburst.  He states " I ain't here for medication, if I wanted something I could go 3 houses down and get that."  Denies chest pain or abdominal pain.  He states, "This is not a kidney stone.  I don't have no abdominal pain." No Chest pain, no shortness of breath, no cough. Denies other medical conditions. Patient states he is clean off drugs and in a 12 step program.  No SI or HI.    Past Medical History:  Diagnosis Date  . ADHD (attention deficit hyperactivity disorder)   . Bipolar 1 disorder (HCC)   . Chronic back pain   . Chronic pain    chronic l leg pain  . DDD  (degenerative disc disease), lumbar   . Degenerative disc disease, lumbar   . Depression   . GERD (gastroesophageal reflux disease)   . Hepatitis C   . Hepatitis C   . History of stomach ulcers   . Sciatica   . Substance abuse [facility])     Patient Active Problem List   Diagnosis Date Noted  . Cocaine abuse with cocaine-induced mood disorder (HCC) 01/20/2017  . Benzodiazepine abuse (HCC) 01/20/2017  . Cocaine use disorder, moderate, dependence (HCC) 05/23/2015  . Opioid use disorder, moderate, dependence (HCC) 05/23/2015  . 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 Substance or medication-induced bipolar and related disorder with onset during intoxication (HCC) 05/23/2015  . Hepatitis C   . 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 FRACTURE SURGERY    . HARDWARE REMOVAL  01/27/2012   Procedure: HARDWARE REMOVAL;  Surgeon: Kathryne Hitch, MD;  Location: [facility];  Service: Orthopedics;  Laterality: Left;  . TIBIA IM NAIL INSERTION  01/27/2012   Procedure: INTRAMEDULLARY (IM) NAIL TIBIAL;  Surgeon: Kathryne Hitch, MD;  Location: [facility];  Service: Orthopedics;  Laterality: Left;  Removal of IM Rod and Screws Left Tibia with Exchange Nail, Allograft Bone Graft Left Tibia        Home Medications    Prior to Admission medications  Medication Sig Start Date End Date Taking? Authorizing Provider  doxycycline (VIBRAMYCIN) 100 MG capsule Take 1 capsule (100 mg total) by mouth 2 (two) times daily. 05/01/17   Ivery Quale, PA-C  DULoxetine (CYMBALTA) 30 MG capsule Take 1 capsule (30 mg total) by mouth daily. For depression 01/20/17   Charm Rings, NP  methocarbamol (ROBAXIN) 500 MG tablet Take 1 tablet (500 mg total) by mouth 2 (two) times daily. 05/15/17   Janne Napoleon, NP  naproxen (NAPROSYN) 500 MG tablet Take 1 tablet (500 mg total) by mouth 2 (two) times daily. 05/15/17   Janne Napoleon, NP  QUEtiapine (SEROQUEL) 100 MG tablet Take 1 tablet (100 mg total) by mouth 2 (two) times daily. 01/20/17   Charm Rings, NP    Family History Family History  Problem Relation Age of Onset  . Diabetes Mother     Social History Social History   Tobacco Use  . Smoking status: Current Every Day Smoker    Packs/day: 1.00    Years: 24.00    Pack years: 24.00    Types: Cigarettes  . Smokeless tobacco: Former Neurosurgeon    Quit date: 08/08/2006  Substance Use Topics  . Alcohol use: Yes    Comment: liquor  . Drug use: Not Currently    Types: Cocaine    Comment: last used few mos     Allergies   Acetaminophen and Morphine and related   Review of Systems Review of Systems  Constitutional: Negative for chills, diaphoresis, fatigue, fever and weight loss.  Respiratory: Negative for shortness of breath.   Cardiovascular: Negative for chest pain, palpitations and leg swelling.  Gastrointestinal: Negative for abdominal pain, bowel incontinence, nausea and vomiting.  Genitourinary: Negative for bladder incontinence, difficulty urinating, dysuria, flank pain, frequency, hematuria and pelvic pain.  Musculoskeletal: Positive for back pain.  Skin: Negative for rash.  Neurological: Negative for dizziness, tingling, tremors, seizures, syncope, facial asymmetry, speech difficulty, weakness, light-headedness, numbness, headaches and paresthesias.  Psychiatric/Behavioral: Negative for self-injury.  All other systems reviewed and are negative.    Physical Exam Updated Vital Signs BP 127/88 (BP Location: Right Arm)   Pulse (!) 114   Temp 99.4 F (37.4 C) (Oral)   Resp 20   Ht  (1.6 m)   Wt 72.6 kg (160 lb)   SpO2 100%   BMI 28.34 kg/m   Physical Exam  Constitutional: He is oriented to person, place, and time. He appears well-developed and well-nourished. No distress.  Resting comfortably in the room laughing  HENT:  Head: Normocephalic and atraumatic.  Mouth/Throat: No  oropharyngeal exudate.  Eyes: Pupils are equal, round, and reactive to light. Conjunctivae and EOM are normal.  Neck: Normal range of motion. Neck supple.  Cardiovascular: Normal rate, regular rhythm, normal heart sounds and intact distal pulses.  Pulmonary/Chest: Effort normal and breath sounds normal. No stridor. He has no wheezes. He has no rales.  Abdominal: Soft. Bowel sounds are normal. He exhibits no mass. There is no tenderness. There is no rebound and no guarding.  Musculoskeletal: Normal range of motion. He exhibits no tenderness or deformity.       Right hip: Normal.       Left hip: Normal.       Right knee: Normal.       Left knee: Normal.       Right ankle: Normal. Achilles tendon normal.       Left ankle: Normal.  Cervical back: Normal. He exhibits normal range of motion, no tenderness, no bony tenderness, no swelling, no edema, no deformity, no laceration, no pain, no spasm and normal pulse.       Thoracic back: Normal. He exhibits normal range of motion, no tenderness, no bony tenderness, no swelling, no edema, no deformity, no laceration, no pain, no spasm and normal pulse.       Lumbar back: Normal. He exhibits normal range of motion, no tenderness, no bony tenderness, no swelling, no edema, no deformity, no laceration, no pain, no spasm and normal pulse.  Gait normal in ED, cap refill < 2 sec to digits of fingers and toes  Neurological: He is alert and oriented to person, place, and time. He displays normal reflexes. He exhibits normal muscle tone. Coordination normal.  Intact L5/s1 intact perineal sensation  Skin: Skin is warm and dry. Capillary refill takes less than 2 seconds.  No Osler nodes, no Janeway lesions no splinter hemorrhages  Psychiatric: Thought content normal.     ED Treatments / Results  Labs (all labs ordered are listed, but only abnormal results are displayed) Results for orders placed or performed during the hospital encounter of 05/16/17    Urinalysis, Routine w reflex microscopic  Result Value Ref Range   Color, Urine YELLOW YELLOW   APPearance CLEAR CLEAR   Specific Gravity, Urine 1.014 1.005 - 1.030   pH 6.0 5.0 - 8.0   Glucose, UA NEGATIVE NEGATIVE mg/dL   Hgb urine dipstick SMALL (A) NEGATIVE   Bilirubin Urine NEGATIVE NEGATIVE   Ketones, ur 20 (A) NEGATIVE mg/dL   Protein, ur 161 (A) NEGATIVE mg/dL   Nitrite NEGATIVE NEGATIVE   Leukocytes, UA MODERATE (A) NEGATIVE   RBC / HPF 0-5 0 - 5 RBC/hpf   WBC, UA 21-50 0 - 5 WBC/hpf   Bacteria, UA NONE SEEN NONE SEEN   Squamous Epithelial / LPF 0-5 0 - 5   Mucus PRESENT   Rapid urine drug screen (hospital performed)  Result Value Ref Range   Opiates NONE DETECTED NONE DETECTED   Cocaine POSITIVE (A) NONE DETECTED   Benzodiazepines POSITIVE (A) NONE DETECTED   Amphetamines NONE DETECTED NONE DETECTED   Tetrahydrocannabinol NONE DETECTED NONE DETECTED   Barbiturates NONE DETECTED NONE DETECTED   Dg Lumbar Spine Complete  Result Date: 05/15/2017 CLINICAL DATA:  Chronic low back pain beginning last night.  Fall. EXAM: LUMBAR SPINE - COMPLETE 4+ VIEW COMPARISON:  CT abdomen and pelvis 06/15/2016. FINDINGS: Five non rib-bearing lumbar type vertebral bodies are present. Levoconvex curvature of the lumbar spine is centered at L2-3. Remote superior endplate fracture is again noted at L1. Endplate sclerotic changes are noted on the right at L 2-3. There is slight retrolisthesis at L2-3. No acute fracture traumatic subluxation is present. IMPRESSION: 1. No acute abnormality. 2. 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 3. Remote superior endplate L1 fracture. Electronically Signed   By: Marin Roberts M.D.   On: 05/15/2017 14:20    Radiology Dg Lumbar Spine Complete  Result Date: 05/15/2017 CLINICAL DATA:  Chronic low back pain beginning last night.  Fall. EXAM: LUMBAR SPINE - COMPLETE 4+ VIEW COMPARISON:  CT abdomen and  pelvis 06/15/2016. FINDINGS: Five non rib-bearing lumbar type vertebral bodies are present. Levoconvex curvature of the lumbar spine is centered at L2-3. Remote superior endplate fracture is again noted at L1. Endplate sclerotic changes are noted on the right at L 2-3. There is slight  retrolisthesis at L2-3. No acute fracture traumatic subluxation is present. IMPRESSION: 1. No acute abnormality. 2. 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 3. Remote superior endplate L1 fracture. Electronically Signed   By: Marin Roberts M.D.   On: 05/15/2017 14:20    Procedures Procedures (including critical care time)  Medications Ordered in ED Medications  ketorolac (TORADOL) 30 MG/ML injection 15 mg (has no administration in time range)  lidocaine (LIDODERM) 5 % 2 patch (has no administration in time range)    Informed by [employer] that sister is outside wanting to talk to me.  Also informed by charge brother is on the phone demanding to speak with the CEO, reportedly has videos.    PO challenged successfully in the ED.  Walking to nurses station, asking for food  Final Clinical Impressions(s) / ED Diagnoses  He will not be getting narcotics from me as this is chronic pain and that is our department policy especially in light of his substance abuse and overdose.  There is no stigmata of endocarditis.  No signs of infection on exam or imaging.  Moreover, I doubt cord pathology. Patient walked to the bed had intact L5/s1 intact sensation and is resting comfortably.  Findings on Xray are chronic.  There is no indication for emergent MRI.  With nurses present as witnesses to the entirety to the history and physical I have informed him of this information.  There are no lytic or blastic lesions on XRAY.  We will treat patient for UTI.  Please note his urine is positive for multiple illicit drugs even though patient lied and told me he was clean an in a 12 step  program.  We will refer him to a spine physician for ongoing care.  This represents drug seeking behavior.  I will not be speaking with family as they have threatened staff earlier today.   Return for weakness, numbness, changes in vision or speech, fevers >100.4 unrelieved by medication, shortness of breath, intractable vomiting, or diarrhea, abdominal pain, Inability to tolerate liquids or food, cough, altered mental status or any concerns. No signs of systemic illness or infection. The patient is nontoxic-appearing on exam and vital signs are within normal limits.   I have reviewed the triage vital signs and the nursing notes. Pertinent labs &imaging results that were available during my care of the patient were reviewed by me and considered in my medical decision making (see chart for details).  After history, exam, and medical workup I feel the patient has been appropriately medically screened and is safe for discharge home. Pertinent diagnoses were discussed with the patient. Patient was given return precautions.    Lennix Rotundo, MD 05/16/17 Marijean Bravo, Abbigaile Rockman, MD 05/16/17 1610

## 2017-05-16 NOTE — ED Triage Notes (Signed)
Pt presents via EMS with a complaint of acute on chronic back pain that was evaluated x2 yesterday. Patient able to stand and walk on own. Patient calm and cooperative on scene. Patient suffers from chronic back issues. Patient has not filled his prescriptions from his previous visits. 250 mcg fentanyl given en route. On scene, per EMS, patient's sister was aggressive and violent towards EMS staff. Sister was video taping, cursing at EMS and making threats. Per EMS, sister stated, "I'm gonna cause a scene and I'll probably go to jail." Visitors will be restricted for patient.

## 2017-05-16 NOTE — ED Triage Notes (Signed)
Patient was seen here yesterday for same.

## 2017-05-16 NOTE — ED Notes (Signed)
Patient states that his sister called in to request and order for both CT and MRI. Checked with provider and she stated that patient will have to wait in lobby until he can be seen. Patient advised of this and patient attempted/appeared to be recording most/some of conversation with this nurse. Patient advised that I did not give him permission to record our conversation and that it is against hospital policy. Patient placed in lobby for acuity of 4.

## 2017-05-16 NOTE — ED Notes (Addendum)
Patient stating that his back has hurt for a while now. Patient stating he doesn't want pain medication but would like to know what's wrong. Patient states "If I needed pain medicine I could go 5 [address]." Patient admits to easily being able to obtain heroin

## 2017-05-16 NOTE — ED Notes (Signed)
Received a call from patient's brother in law concerning care for patient. Brother in law expressed concerns about patient's care and would like to speak to the "nursing supervisor or whoever is in charge." Brother in Social worker admitting to filming staff and patient's in the ER. Brother in Social worker stating he has video of patient "crawling to my car." Security, Press photographer and [facility] notified and aware of family filming.

## 2017-05-16 NOTE — ED Notes (Addendum)
Attempted to discharge patient. Unbeknownst to this Clinical research associate at the time, Patient had sister on phone during discharge. When discussing that the doctor was not going to be able to come and talk to the patient (see previous note), patient picks up the phone off of the bed and stated, "do you hear this shit?" Patient stated that it was his sister on the phone. Patient states, "well I haven't signed yet so I'm good to go to stay in here and wait." Discussed with patient that no he could not stay in the room and that he was up for discharge. Patient refusing to sign for papers. While attempting to finish discharge, patient attempted to hand this Clinical research associate the phone stating that his sister wanted to speak with this Clinical research associate. Refused multiple times to speak with sister due to ongoing visitor issues. Patient upset that I would not take phone so this writer left room and alerted security. Patient ambulatory in the room and able to dress self without assistance.

## 2017-05-16 NOTE — ED Notes (Addendum)
Pt's Sister Teodoro Kil. She stated "I feel like nothing was done to help him at [facility]. We wanted Pt taken to a different facility outside of [facility] but EMS refused. I feel like [facility] is treating him as a drug seeker b/c of his history. Pt is in a lot of pain and this is not his normal. Pt is not drug seeking and if he was he could get his drugs off the street. Pt fell and has not been able to walk since then and we are doing nothing to actually figure out what is wrong."   This tech told Pt's sister I would document her concerns.

## 2017-05-16 NOTE — ED Notes (Signed)
Patient asking about narcotic pain medication. Patient requesting a short 2 day prescription for pain pills. Discussed with patient that chronic pain is not treated with narcotics and we do not prescribe them for it. Patient asking to speak with doctor about narcotics. EDP made aware.

## 2017-05-16 NOTE — ED Notes (Addendum)
Pt able to get off stretcher and walk around to be opposite side of bed prior to laying down. Dr Harley Hallmark in with writer to assess pt. Other than pain no deficits noted on assessment. Pt has chronic pain and has been seen and treated earlier in the evening for same. Pt did comment "if I wanted drugs I would just 2 houses down"

## 2017-05-16 NOTE — ED Notes (Signed)
Bed: ZO10 Expected date: 05/15/17 Expected time: 11:59 PM Means of arrival: Ambulance Comments: Back Pain

## 2017-05-16 NOTE — ED Notes (Addendum)
Pts brother has called serectary that he wants to talk to the CEO and show him video of pt crawling to car as he left on his prior visit. Dr Harley Hallmark and writer aware

## 2017-05-17 ENCOUNTER — Encounter (HOSPITAL_COMMUNITY): Payer: Self-pay | Admitting: *Deleted

## 2017-05-17 ENCOUNTER — Other Ambulatory Visit: Payer: Self-pay

## 2017-05-17 ENCOUNTER — Emergency Department (HOSPITAL_COMMUNITY): Payer: Self-pay

## 2017-05-17 ENCOUNTER — Inpatient Hospital Stay (HOSPITAL_COMMUNITY)
Admission: EM | Admit: 2017-05-17 | Discharge: 2017-05-26 | DRG: 540 | Disposition: A | Discharge status: Home / Self Care | Payer: Self-pay | Attending: Internal Medicine | Admitting: Internal Medicine

## 2017-05-17 DIAGNOSIS — K219 Gastro-esophageal reflux disease without esophagitis: Secondary | ICD-10-CM | POA: Diagnosis present

## 2017-05-17 DIAGNOSIS — F1120 Opioid dependence, uncomplicated: Secondary | ICD-10-CM | POA: Diagnosis present

## 2017-05-17 DIAGNOSIS — M4646 Discitis, unspecified, lumbar region: Secondary | ICD-10-CM | POA: Diagnosis present

## 2017-05-17 DIAGNOSIS — G4700 Insomnia, unspecified: Secondary | ICD-10-CM | POA: Diagnosis present

## 2017-05-17 DIAGNOSIS — M4626 Osteomyelitis of vertebra, lumbar region: Principal | ICD-10-CM | POA: Diagnosis present

## 2017-05-17 DIAGNOSIS — Z1623 Resistance to quinolones and fluoroquinolones: Secondary | ICD-10-CM | POA: Diagnosis present

## 2017-05-17 DIAGNOSIS — Z885 Allergy status to narcotic agent status: Secondary | ICD-10-CM

## 2017-05-17 DIAGNOSIS — R7982 Elevated C-reactive protein (CRP): Secondary | ICD-10-CM | POA: Diagnosis present

## 2017-05-17 DIAGNOSIS — F1910 Other psychoactive substance abuse, uncomplicated: Secondary | ICD-10-CM

## 2017-05-17 DIAGNOSIS — R7881 Bacteremia: Secondary | ICD-10-CM | POA: Diagnosis present

## 2017-05-17 DIAGNOSIS — Z9119 Patient's noncompliance with other medical treatment and regimen: Secondary | ICD-10-CM

## 2017-05-17 DIAGNOSIS — M4640 Discitis, unspecified, site unspecified: Secondary | ICD-10-CM

## 2017-05-17 DIAGNOSIS — B1920 Unspecified viral hepatitis C without hepatic coma: Secondary | ICD-10-CM | POA: Diagnosis present

## 2017-05-17 DIAGNOSIS — Z791 Long term (current) use of non-steroidal anti-inflammatories (NSAID): Secondary | ICD-10-CM

## 2017-05-17 DIAGNOSIS — F1410 Cocaine abuse, uncomplicated: Secondary | ICD-10-CM | POA: Diagnosis present

## 2017-05-17 DIAGNOSIS — F909 Attention-deficit hyperactivity disorder, unspecified type: Secondary | ICD-10-CM | POA: Diagnosis present

## 2017-05-17 DIAGNOSIS — M4620 Osteomyelitis of vertebra, site unspecified: Secondary | ICD-10-CM

## 2017-05-17 DIAGNOSIS — B182 Chronic viral hepatitis C: Secondary | ICD-10-CM | POA: Diagnosis present

## 2017-05-17 DIAGNOSIS — B955 Unspecified streptococcus as the cause of diseases classified elsewhere: Secondary | ICD-10-CM

## 2017-05-17 DIAGNOSIS — F17210 Nicotine dependence, cigarettes, uncomplicated: Secondary | ICD-10-CM | POA: Diagnosis present

## 2017-05-17 DIAGNOSIS — G8929 Other chronic pain: Secondary | ICD-10-CM | POA: Diagnosis present

## 2017-05-17 DIAGNOSIS — Z886 Allergy status to analgesic agent status: Secondary | ICD-10-CM

## 2017-05-17 DIAGNOSIS — F332 Major depressive disorder, recurrent severe without psychotic features: Secondary | ICD-10-CM | POA: Diagnosis present

## 2017-05-17 DIAGNOSIS — Z79899 Other long term (current) drug therapy: Secondary | ICD-10-CM

## 2017-05-17 DIAGNOSIS — Z8711 Personal history of peptic ulcer disease: Secondary | ICD-10-CM

## 2017-05-17 DIAGNOSIS — Z720 Tobacco use: Secondary | ICD-10-CM

## 2017-05-17 DIAGNOSIS — F419 Anxiety disorder, unspecified: Secondary | ICD-10-CM | POA: Diagnosis present

## 2017-05-17 DIAGNOSIS — B954 Other streptococcus as the cause of diseases classified elsewhere: Secondary | ICD-10-CM | POA: Diagnosis present

## 2017-05-17 DIAGNOSIS — Z765 Malingerer [conscious simulation]: Secondary | ICD-10-CM

## 2017-05-17 DIAGNOSIS — W19XXXA Unspecified fall, initial encounter: Secondary | ICD-10-CM

## 2017-05-17 DIAGNOSIS — F319 Bipolar disorder, unspecified: Secondary | ICD-10-CM | POA: Diagnosis present

## 2017-05-17 HISTORY — DX: Osteomyelitis of vertebra, lumbar region: M46.26

## 2017-05-17 LAB — CBC WITH DIFFERENTIAL/PLATELET
BASOS PCT: 0 %
Basophils Absolute: 0 10*3/uL (ref 0.0–0.1)
Eosinophils Absolute: 0.1 10*3/uL (ref 0.0–0.7)
Eosinophils Relative: 2 %
HCT: 40.7 % (ref 39.0–52.0)
HEMOGLOBIN: 13 g/dL (ref 13.0–17.0)
Lymphocytes Relative: 27 %
Lymphs Abs: 1.7 10*3/uL (ref 0.7–4.0)
MCH: 30.7 pg (ref 26.0–34.0)
MCHC: 31.9 g/dL (ref 30.0–36.0)
MCV: 96.2 fL (ref 78.0–100.0)
MONOS PCT: 10 %
Monocytes Absolute: 0.6 10*3/uL (ref 0.1–1.0)
NEUTROS PCT: 61 %
Neutro Abs: 3.8 10*3/uL (ref 1.7–7.7)
PLATELETS: 133 10*3/uL — AB (ref 150–400)
RBC: 4.23 MIL/uL (ref 4.22–5.81)
RDW: 13.8 % (ref 11.5–15.5)
WBC: 6.2 10*3/uL (ref 4.0–10.5)

## 2017-05-17 LAB — SEDIMENTATION RATE: SED RATE: 42 mm/h — AB (ref 0–16)

## 2017-05-17 LAB — COMPREHENSIVE METABOLIC PANEL
ALBUMIN: 3.2 g/dL — AB (ref 3.5–5.0)
ALK PHOS: 35 U/L — AB (ref 38–126)
ALT: 37 U/L (ref 17–63)
ANION GAP: 7 (ref 5–15)
AST: 19 U/L (ref 15–41)
BUN: 13 mg/dL (ref 6–20)
CO2: 27 mmol/L (ref 22–32)
Calcium: 9 mg/dL (ref 8.9–10.3)
Chloride: 101 mmol/L (ref 101–111)
Creatinine, Ser: 0.82 mg/dL (ref 0.61–1.24)
GFR calc Af Amer: >60 mL/min (ref >60)
GFR calc non Af Amer: >60 mL/min (ref >60)
GLUCOSE: 107 mg/dL — AB (ref 65–99)
POTASSIUM: 3.8 mmol/L (ref 3.5–5.1)
SODIUM: 135 mmol/L (ref 135–145)
Total Bilirubin: 0.9 mg/dL (ref 0.3–1.2)
Total Protein: 7.1 g/dL (ref 6.5–8.1)

## 2017-05-17 LAB — URINALYSIS, ROUTINE W REFLEX MICROSCOPIC
Bilirubin Urine: NEGATIVE
Glucose, UA: NEGATIVE mg/dL
KETONES UR: NEGATIVE mg/dL
Nitrite: NEGATIVE
PH: 6 (ref 5.0–8.0)
Protein, ur: 100 mg/dL — AB
SPECIFIC GRAVITY, URINE: 1.012 (ref 1.005–1.030)

## 2017-05-17 LAB — C-REACTIVE PROTEIN: CRP: 7.5 mg/dL — AB (ref <1.0)

## 2017-05-17 MED ORDER — OXYCODONE HCL 5 MG PO TABS
5.0000 mg | ORAL_TABLET | ORAL | Status: DC | PRN
  Administered 2017-05-17 – 2017-05-19 (×9): 10 mg via ORAL
  Filled 2017-05-17 (×10): qty 2

## 2017-05-17 MED ORDER — METHOCARBAMOL 1000 MG/10ML IJ SOLN
500.0000 mg | Freq: Four times a day (QID) | INTRAVENOUS | Status: DC | PRN
  Filled 2017-05-17 (×2): qty 5

## 2017-05-17 MED ORDER — HYDROMORPHONE HCL 1 MG/ML IJ SOLN
INTRAMUSCULAR | Status: AC
  Administered 2017-05-17: 3 mg
  Filled 2017-05-17: qty 3

## 2017-05-17 MED ORDER — PANTOPRAZOLE SODIUM 40 MG PO TBEC
40.0000 mg | DELAYED_RELEASE_TABLET | Freq: Every day | ORAL | Status: DC
  Administered 2017-05-18 – 2017-05-26 (×9): 40 mg via ORAL
  Filled 2017-05-17 (×9): qty 1

## 2017-05-17 MED ORDER — HYDROMORPHONE HCL 2 MG/ML IJ SOLN
3.0000 mg | INTRAMUSCULAR | Status: DC | PRN
Start: 2017-05-17 — End: 2017-05-17

## 2017-05-17 MED ORDER — HYDROMORPHONE HCL 1 MG/ML IJ SOLN
1.0000 mg | INTRAMUSCULAR | Status: DC | PRN
  Administered 2017-05-18 (×4): 1 mg via INTRAVENOUS
  Filled 2017-05-17 (×4): qty 1

## 2017-05-17 MED ORDER — DIPHENHYDRAMINE HCL 25 MG PO CAPS
25.0000 mg | ORAL_CAPSULE | Freq: Four times a day (QID) | ORAL | Status: DC | PRN
  Administered 2017-05-21: 25 mg via ORAL
  Filled 2017-05-17 (×2): qty 1

## 2017-05-17 MED ORDER — KETOROLAC TROMETHAMINE 60 MG/2ML IM SOLN
60.0000 mg | Freq: Once | INTRAMUSCULAR | Status: AC
  Administered 2017-05-17: 60 mg via INTRAMUSCULAR
  Filled 2017-05-17: qty 2

## 2017-05-17 MED ORDER — GADOBENATE DIMEGLUMINE 529 MG/ML IV SOLN
15.0000 mL | Freq: Once | INTRAVENOUS | Status: AC | PRN
  Administered 2017-05-17: 15 mL via INTRAVENOUS

## 2017-05-17 MED ORDER — DIAZEPAM 5 MG PO TABS
5.0000 mg | ORAL_TABLET | Freq: Once | ORAL | Status: AC
  Administered 2017-05-17: 5 mg via ORAL
  Filled 2017-05-17: qty 1

## 2017-05-17 MED ORDER — METHOCARBAMOL 1000 MG/10ML IJ SOLN
500.0000 mg | Freq: Three times a day (TID) | INTRAVENOUS | Status: DC | PRN
  Administered 2017-05-17 (×2): 500 mg via INTRAVENOUS
  Filled 2017-05-17 (×2): qty 5

## 2017-05-17 MED ORDER — NICOTINE 21 MG/24HR TD PT24
21.0000 mg | MEDICATED_PATCH | Freq: Every day | TRANSDERMAL | Status: DC
  Administered 2017-05-18 – 2017-05-26 (×6): 21 mg via TRANSDERMAL
  Filled 2017-05-17 (×7): qty 1

## 2017-05-17 MED ORDER — MORPHINE SULFATE (PF) 4 MG/ML IV SOLN
3.0000 mg | INTRAVENOUS | Status: DC | PRN
Start: 2017-05-17 — End: 2017-05-17
  Administered 2017-05-17: 3 mg via INTRAVENOUS
  Filled 2017-05-17: qty 1

## 2017-05-17 MED ORDER — MORPHINE SULFATE (PF) 2 MG/ML IV SOLN
2.0000 mg | INTRAVENOUS | Status: DC | PRN
  Administered 2017-05-17 (×2): 2 mg via INTRAVENOUS
  Filled 2017-05-17 (×2): qty 1

## 2017-05-17 NOTE — ED Notes (Signed)
Pt laying on abd on stretcher, bed rails down, moaning, complaining of pain.  Pt repeatedly asking to see the doctor.  States, "Francesca Oman are not doing anything for me."  Pt asking when he will go to [facility].  Explained that we spoke to pt placement at 1515 and was told the unit he was assigned to had discharges but no one had left yet.    Pt says he can't move to use the restroom.  Offered to help pt use urinal but pt refused.  Notified Dr.  Sondra Barges and he will come see pt.  Pt notified.  Mother at bedside.  Gingerale given upon request.

## 2017-05-17 NOTE — ED Notes (Signed)
Pt hollering out.    Giving another dose of morphine and oxycondone.

## 2017-05-17 NOTE — ED Provider Notes (Signed)
[facility] EMERGENCY DEPARTMENT Provider Note   CSN: [id number] Arrival date & time: 05/17/17  0204     History   Chief Complaint Chief Complaint  Patient presents with  . Back Pain    HPI Joshua Thomas is a 42 y.o. male.  Patient complains of lumbar low back pain that started about 3 days ago after he jumped down a set of steps and landed on his feet.  This is his fourth ED visit in the past 2 days for the same symptoms.  He just left [facility] before coming here today (left without being seen).  He had an x-ray which did not show anything acute.  Patient does have a history of IV drug abuse and hepatitis C.  Denies any previous back surgery. he states he said intermittent back pain in the past but never this severe.  He describes pain across his entire low back that radiates down both legs.  He denies any focal weakness, numbness or tingling.  He states he did have some dribbling of urine when he could not make it to the bathroom on time.  No bowel incontinence.  No fever.  No nausea or vomiting.  No pain with urination or blood in the urine.  There was concern at the other hospital the patient was exhibiting drug-seeking behavior.  The history is provided by the patient.  Back Pain   Pertinent negatives include no fever, no numbness, no abdominal pain, no dysuria and no weakness.    Past Medical History:  Diagnosis Date  . ADHD (attention deficit hyperactivity disorder)   . Bipolar 1 disorder (HCC)   . Chronic back pain   . Chronic pain    chronic l leg pain  . DDD (degenerative disc disease), lumbar   . Degenerative disc disease, lumbar   . Depression   . GERD (gastroesophageal reflux disease)   . Hepatitis C   . Hepatitis C   . History of stomach ulcers   . Sciatica   . Substance abuse [facility])     Patient Active Problem List   Diagnosis Date Noted  . Cocaine abuse with cocaine-induced mood disorder (HCC) 01/20/2017  . Benzodiazepine abuse (HCC) 01/20/2017    . Cocaine use disorder, moderate, dependence (HCC) 05/23/2015  . Opioid use disorder, moderate, dependence (HCC) 05/23/2015  . 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 Substance or medication-induced bipolar and related disorder with onset during intoxication (HCC) 05/23/2015  . Hepatitis C   . 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 FRACTURE SURGERY    . HARDWARE REMOVAL  01/27/2012   Procedure: HARDWARE REMOVAL;  Surgeon: Kathryne Hitch, MD;  Location: [facility] ORS;  Service: Orthopedics;  Laterality: Left;  . TIBIA IM NAIL INSERTION  01/27/2012   Procedure: INTRAMEDULLARY (IM) NAIL TIBIAL;  Surgeon: Kathryne Hitch, MD;  Location: [facility] ORS;  Service: Orthopedics;  Laterality: Left;  Removal of IM Rod and Screws Left Tibia with Exchange Nail, Allograft Bone Graft Left Tibia        Home Medications    Prior to Admission medications   Medication Sig Start Date End Date Taking? Authorizing Provider  cephALEXin (KEFLEX) 500 MG capsule Take 1 capsule (500 mg total) by mouth 4 (four) times daily. 05/16/17   Palumbo, April, MD  doxycycline (VIBRAMYCIN) 100 MG capsule Take 1 capsule (100 mg total) by mouth 2 (two) times daily.  05/01/17   Ivery Quale, PA-C  DULoxetine (CYMBALTA) 30 MG capsule Take 1 capsule (30 mg total) by mouth daily. For depression 01/20/17   Charm Rings, NP  lidocaine (LIDODERM) 5 % Place 1 patch onto the skin daily. Remove & Discard patch within 12 hours or as directed by MD 05/16/17   Nicanor Alcon, April, MD  methocarbamol (ROBAXIN) 500 MG tablet Take 1 tablet (500 mg total) by mouth 2 (two) times daily. 05/15/17   Janne Napoleon, NP  naproxen (NAPROSYN) 500 MG tablet Take 1 tablet (500 mg total) by mouth 2 (two) times daily. 05/15/17   Janne Napoleon, NP  QUEtiapine (SEROQUEL) 100 MG tablet Take 1 tablet (100 mg total) by mouth 2 (two) times daily. 01/20/17   Charm Rings, NP     Family History Family History  Problem Relation Age of Onset  . Diabetes Mother     Social History Social History   Tobacco Use  . Smoking status: Current Every Day Smoker    Packs/day: 1.00    Years: 24.00    Pack years: 24.00    Types: Cigarettes  . Smokeless tobacco: Former Neurosurgeon    Quit date: 08/08/2006  Substance Use Topics  . Alcohol use: Yes    Comment: liquor  . Drug use: Not Currently    Types: Cocaine    Comment: last used few mos     Allergies   Acetaminophen and Morphine and related   Review of Systems Review of Systems  Constitutional: Negative for activity change, appetite change and fever.  HENT: Negative for congestion and postnasal drip.   Respiratory: Negative for chest tightness and shortness of breath.   Gastrointestinal: Negative for abdominal pain, nausea and vomiting.  Genitourinary: Negative for difficulty urinating, dysuria, hematuria and urgency.  Musculoskeletal: Positive for arthralgias, back pain and myalgias.  Neurological: Negative for dizziness, weakness, light-headedness and numbness.    all other systems are negative except as noted in the HPI and PMH.    Physical Exam Updated Vital Signs BP (!) 152/89   Pulse 100   Temp 98.4 F (36.9 C) (Oral)   Resp 20   SpO2 100%   Physical Exam  Constitutional: He is oriented to person, place, and time. He appears well-developed and well-nourished. No distress.  Uncomfortable, laying in fetal position  HENT:  Head: Normocephalic and atraumatic.  Mouth/Throat: Oropharynx is clear and moist. No oropharyngeal exudate.  Eyes: Pupils are equal, round, and reactive to light. Conjunctivae and EOM are normal.  Neck: Normal range of motion. Neck supple.  No meningismus.  Cardiovascular: Normal rate, regular rhythm, normal heart sounds and intact distal pulses.  No murmur heard. Pulmonary/Chest: Effort normal and breath sounds normal. No respiratory distress.  Abdominal: Soft. There is no  tenderness. There is no rebound and no guarding.  Musculoskeletal: Normal range of motion. He exhibits tenderness. He exhibits no edema.  Diffuse paraspinal lumbar tenderness  no stepoff  5/5 strength in bilateral lower extremities. Ankle plantar and dorsiflexion intact. Great toe extension intact bilaterally. +2 DP and PT pulses. +2 patellar reflexes bilaterally.    Neurological: He is alert and oriented to person, place, and time. No cranial nerve deficit. He exhibits normal muscle tone. Coordination normal.  No ataxia on finger to nose bilaterally. No pronator drift. 5/5 strength throughout. CN 2-12 intact.Equal grip strength. Sensation intact.   Skin: Skin is warm. Rash noted.  No splinter hemorrhages  Psychiatric: He has a normal mood and affect. His  behavior is normal.  Nursing note and vitals reviewed.    ED Treatments / Results  Labs (all labs ordered are listed, but only abnormal results are displayed) Labs Reviewed  CBC WITH DIFFERENTIAL/PLATELET - Abnormal; Notable for the following components:      Result Value   Platelets 133 (*)    All other components within normal limits  CULTURE, BLOOD (ROUTINE X 2)  CULTURE, BLOOD (ROUTINE X 2)  URINALYSIS, ROUTINE W REFLEX MICROSCOPIC  COMPREHENSIVE METABOLIC PANEL  SEDIMENTATION RATE  C-REACTIVE PROTEIN    EKG None  Radiology Dg Lumbar Spine Complete  Result Date: 05/15/2017 CLINICAL DATA:  Chronic low back pain beginning last night.  Fall. EXAM: LUMBAR SPINE - COMPLETE 4+ VIEW COMPARISON:  CT abdomen and pelvis 06/15/2016. FINDINGS: Five non rib-bearing lumbar type vertebral bodies are present. Levoconvex curvature of the lumbar spine is centered at L2-3. Remote superior endplate fracture is again noted at L1. Endplate sclerotic changes are noted on the right at L 2-3. There is slight retrolisthesis at L2-3. No acute fracture traumatic subluxation is present. IMPRESSION: 1. No acute abnormality. 2. Stable levoconvex curvature  of the lumbar spine with asymmetric right-sided endplate change. 3. Remote superior endplate L1 fracture. Electronically Signed   By: Marin Roberts M.D.   On: 05/15/2017 14:20   Mr Lumbar Spine W Wo Contrast  Result Date: 05/17/2017 CLINICAL DATA:  Low back pain for 3-4 days. No known injury. The patient suffered a fall down steps 05/14/2017. EXAM: MRI LUMBAR SPINE WITHOUT AND WITH CONTRAST TECHNIQUE: Multiplanar and multiecho pulse sequences of the lumbar spine were obtained without and with intravenous contrast. CONTRAST:  15 ml MULTIHANCE GADOBENATE DIMEGLUMINE 529 MG/ML IV SOLN COMPARISON:  CT abdomen and pelvis 06/15/2016 and 02/02/2016. CT lumbar spine 05/17/2017. FINDINGS: Segmentation:  Standard. Alignment:  Maintained. Vertebrae: Mild, chronic anterior superior endplate compression fracture of L1 is unchanged. There is marrow edema and enhancement throughout the L2 and L3 vertebral bodies. Degenerative endplate signal change is seen at L5-S1. Conus medullaris and cauda equina: Conus extends to the L1 level. Conus and cauda equina appear normal. Paraspinal and other soft tissues: Negative. Disc levels: T10-11 and T11-12 are imaged in the sagittal plane only. Disc bulging is seen at both levels but the central canal and foramina appear open. T12-L1: Negative. L1-2: Shallow disc bulge without central canal or foraminal stenosis. L2-3: There is a shallow disc bulge without central canal or foraminal stenosis. There is paraspinous inflammatory change centered about the disc interspace. No abscess is identified. There is no fluid in the disc interspace. L3-4: Shallow disc bulge and a small left foraminal protrusion. The central canal and right foramen are widely patent. Mild left foraminal narrowing is noted. L4-5: Shallow disc bulge and mild-to-moderate facet degenerative change without central canal or foraminal stenosis. L5-S1: Minimal disc bulge and a small left foraminal protrusion. There is mild  left foraminal narrowing. The central canal and right foramen are widely patent. IMPRESSION: Edema and enhancement throughout the L2 and L3 vertebral bodies with surrounding paraspinous inflammatory change most consistent with discitis and osteomyelitis. Negative for abscess or central canal compromise. Chronic L1 anterior, superior endplate compression fracture. Mild degenerative disc disease without central canal stenosis as described above. Electronically Signed   By: Drusilla Kanner M.D.   On: 05/17/2017 07:35   Ct L-spine No Charge  Addendum Date: 05/17/2017   ADDENDUM REPORT: 05/17/2017 05:25 ADDENDUM: Note findings and recommendations on concurrent CT of abdomen and pelvis indicating haziness of  prevertebral soft tissues at the L2-3 level. Electronically Signed   By: Mitzi Hansen M.D.   On: 05/17/2017 05:25   Result Date: 05/17/2017 CLINICAL DATA:  42 y/o M; lower back pain starting 3-4 days ago. Recent fall. EXAM: CT LUMBAR SPINE WITHOUT CONTRAST TECHNIQUE: Multidetector CT imaging of the lumbar spine was performed without intravenous contrast administration. Multiplanar CT image reconstructions were also generated. COMPARISON:  Concurrent and 01/05/2017 CT abdomen and pelvis. FINDINGS: Segmentation: 5 lumbar type vertebrae. Alignment: Mild lumbar levocurvature with apex at L3. Normal lumbar lordosis without listhesis. Vertebrae: Stable mild anterior compression deformity of L1 vertebral body. Stable severe discogenic degenerative changes of L2-3 opposing endplates. No acute fracture or new loss of vertebral body height. Paraspinal and other soft tissues: Please refer to concurrent CT of the abdomen and pelvis. Disc levels: Moderate L2-3 loss of intervertebral disc space height and multilevel mild loss of intervertebral disc space height. Disc bulges and endplate marginal osteophytes result in mild foraminal stenosis bilaterally at L1 through L4 and left L4-5. There is moderate stenosis at  the left L5-S1 neural foramen. No high-grade bony canal stenosis. IMPRESSION: 1. No acute fracture or malalignment. 2. Stable chronic L1 mild anterior compression deformity. 3. Lumbar spondylosis with discogenic degenerative changes greatest at the L2-3 level are stable. Moderate left L5-S1 neural foraminal stenosis. Multilevel mild neural foraminal stenosis. No high-grade bony canal stenosis. Electronically Signed: By: Mitzi Hansen M.D. On: 05/17/2017 03:51   Ct Renal Stone Study  Result Date: 05/17/2017 CLINICAL DATA:  42 year old male with flank pain. Concern for renal stone. EXAM: CT ABDOMEN AND PELVIS WITHOUT CONTRAST TECHNIQUE: Multidetector CT imaging of the abdomen and pelvis was performed following the standard protocol without IV contrast. COMPARISON:  Lumbar spine radiograph dated 05/15/2017 and CT of the abdomen pelvis dated 01/05/2017 FINDINGS: Evaluation of this exam is limited in the absence of intravenous contrast. Lower chest: There is mild cardiomegaly. There is a 17 mm left lower lobe subpleural nodule as seen on the prior CT follow-up as recommended on the report of the prior CT. No intra-abdominal free air or free fluid. Hepatobiliary: No focal liver abnormality is seen. No gallstones, gallbladder wall thickening, or biliary dilatation. Pancreas: Unremarkable. No pancreatic ductal dilatation or surrounding inflammatory changes. Spleen: Top-normal spleen size. Stable surgical clips in the inferior spleen. Adrenals/Urinary Tract: The adrenal glands are unremarkable. There is a nonobstructing stone in the interpolar aspect of the right kidney measuring 7 mm. There is no hydronephrosis. A 3 mm nonobstructing left renal upper pole calculus and probable additional punctate nonobstructing stones noted. There is no hydronephrosis on either side. Subcentimeter left renal hypodense lesion is not characterized. The visualized ureters and urinary bladder appear unremarkable. Stomach/Bowel:  There is no bowel obstruction or active inflammation. The appendix is normal. Vascular/Lymphatic: The abdominal aorta and IVC are grossly unremarkable on this noncontrast CT. No portal venous gas. There is no adenopathy. Reproductive: The prostate and seminal vesicles are grossly unremarkable. No pelvic mass. Other: None Musculoskeletal: There is old compression fracture of the superior endplate of L1 with approximately 40% loss of vertebral body height anteriorly. There is sclerotic changes of the inferior L2 and superior L3 with areas of endplate irregularity at L2-L3. There is haziness of the perivertebral soft tissues primarily at L2-L3. The perivertebral soft tissue haziness appears more pronounced or new compared to the prior CT. An infectious process involving the spine and disc at this level is not excluded. Further evaluation with MRI without and with  contrast is advised. There is no acute fracture. IMPRESSION: 1. There is sclerotic changes primarily at L2-L3 with irregularity of the endplate at this level. There is associated haziness of the perivertebral soft tissues, new or more prominent compared to the prior CT. Findings concerning for an infectious process/spondylo discitis. Further evaluation with MRI without and with contrast is recommended. 2. Degenerative changes of the spine with old L1 superior endplate compression fracture. No acute fracture or subluxation. 3. Bilateral nonobstructing renal calculi measure up to 7 mm in the interpolar aspect of the right kidney. No hydronephrosis. Electronically Signed   By: Elgie Collard M.D.   On: 05/17/2017 04:59    Procedures Procedures (including critical care time)  Medications Ordered in ED Medications  ketorolac (TORADOL) injection 60 mg (60 mg Intramuscular Given 05/17/17 0309)  diazepam (VALIUM) tablet 5 mg (5 mg Oral Given 05/17/17 0309)     Initial Impression / Assessment and Plan / ED Course  I have reviewed the triage vital signs and  the nursing notes.  Pertinent labs & imaging results that were available during my care of the patient were reviewed by me and considered in my medical decision making (see chart for details).    Patient with history of IV drug abuse as well as chronic back pain returning for fourth visit in the past 48 hours for worsening back pain after a fall.  He is quite dramatic and complaining of severe pain that radiates down both legs.  On exam he has good strength and sensation and equal distal pulses.  He is afebrile. He states he last used IV drugs 1 week ago. Does not have midline spine tenderness. X-ray obtained yesterday did not show any acute injury.  Urinalysis from yesterday was suspicious for infection and culture was sent.  Patient states he does have a history of kidney stones and is concerned about that.  Seems reasonable to proceed with CT scan.  Patient agreeable to muscle relaxers but understands he will not get any IV narcotics.  CT scan results noted. No ureteral stones.  Discussed with Dr. Harrie Jeans of radiology.  He states the changes at L2-L3 endplates are chronic since 2018.  There may be new haziness in the soft tissue surrounding L2 and L3.  MRI findings are consistent with discitis and osteomyelitis at L2 and L3.  This was discussed with Vinnie PA for neurosurgery.  He agrees with medical admission, IR consult for biopsy and ID consult.  Given patient stability, no antibiotics until after culture obtained.  Labs not ordered initially, now will be obtained with blood cultures.  Patient and family updated. D/w Dr. Gwenlyn Perking of hospitalist service who will arrange transfer to [facility] given availability of IR, ID and neurosurgery.  Final Clinical Impressions(s) / ED Diagnoses   Final diagnoses:  Osteomyelitis of spine [facility])    ED Discharge Orders    None       Glynn Octave, MD 05/17/17 [phone]

## 2017-05-17 NOTE — ED Notes (Signed)
Spoke with Patient placement. It will be awhile before bed is assigned. [facility] ED holding patient's and waiting on discharges.

## 2017-05-17 NOTE — ED Notes (Signed)
Dr. Gwenlyn Perking notified of increasing pain.

## 2017-05-17 NOTE — ED Triage Notes (Signed)
Pt c/o l\ower back pain that started 3-4 days ago, was seen at [place] and left [facility] prior to coming to [facility]

## 2017-05-17 NOTE — ED Notes (Signed)
Patient verbally abusive and belligerent towards staff. Patient placed himself in floor. Patient screaming out.

## 2017-05-17 NOTE — ED Notes (Signed)
Pt states his pain is starting to move up his back and pain is worse.   Pt continues to holler out.

## 2017-05-17 NOTE — ED Notes (Signed)
Attempted to call report to unit [facility]. Was placed on hold for extended period both times.

## 2017-05-17 NOTE — ED Notes (Signed)
Pt continues to holler out.  Spoke with Dr. Gwenlyn Perking .  OK to give another dose of IV robaxin now.

## 2017-05-17 NOTE — ED Notes (Signed)
Pt continues to holler out.

## 2017-05-17 NOTE — ED Notes (Signed)
Unable to get blood pressure .  Pt will not relax arm.

## 2017-05-17 NOTE — H&P (Signed)
History and Physical    CYPHER PAULE SVX:[id number] DOB: Feb 13, 1975 DOA: 05/17/2017  PCP: Patient, No Pcp Per   I have briefly reviewed patients previous medical reports in [facility].  Patient coming from: home  Chief Complaint: Acute on chronic back pain; difficulty ambulating  HPI: Joshua Thomas is a 42 year old male with a past medical history significant for bipolar disorder, chronic back pain, hepatitis C, GERD, tobacco abuse and polysubstance abuse (including IV drug use and cocaine; currently active last use of IV drugs 1 week ago); who presented to the emergency department secondary to acute on chronic back pain and inability to walk.  Patient reports having a mild fall approximately 5-6 days prior to admission, and in between was seen couple times in different emergency departments associated with home health system due to the acute pain in his back.  Patient describes the pain as 10 out of 10 in intensity, localizing his lower back and cannot radiated in a belt shape to both of his flanks, there was no associated symptoms, no relieving factors and no lightning pain down his legs, or urine/boewl  Incontinence or retention.  Of note, during those visits patient had a lumbar x-ray which demonstrated no acute fracture and he was essentially set up for outpatient follow-up at outpatient pain clinic to continue addressing his pain.  Given continue of his symptoms and truly inability of control the discomfort he came to and a pain ED looking at this moment for a third opinion and further care.  In the ED a CT scan was done with a subsequent MRI of his lumbar spine for confirmation, demonstrating acute discitis/osteomyelitis.  There was no abscess or phlegmon appreciated. Patient denies fever, chills, nausea, vomiting, headache, abdominal pain, dysuria, hematuria, melena, hematochezia, shortness of breath or any focal neurologic deficit.  ED Course: As mentioned above patient had CT scan  and MRI of his lower back demonstrating acute discitis/osteomyelitis, he has blood work that demonstrated normal WBCs and essential normal chemistry; there is a mild elevation of CRP and ESR.  Case was discussed with neurosurgery who recommended IR to perform aspiration of affected area to help for cultures and involvement of infectious disease services to dictate antibiotic regimen and length of therapy.  TRH was consulted to admit patient is going to have further work-up and management provided.  Patient receive pain medication and muscle relaxant helping managing his discomfort.  Review of Systems:  All other systems reviewed and apart from HPI, are negative.  Past Medical History:  Diagnosis Date  . ADHD (attention deficit hyperactivity disorder)   . Bipolar 1 disorder (Joshua Thomas)   . Chronic back pain   . Chronic pain    chronic l leg pain  . DDD (degenerative disc disease), lumbar   . Degenerative disc disease, lumbar   . Depression   . GERD (gastroesophageal reflux disease)   . Hepatitis C   . Hepatitis C   . History of stomach ulcers   . Sciatica   . Substance abuse [facility])     Past Surgical History:  Procedure Laterality Date  . FRACTURE SURGERY    . HARDWARE REMOVAL  01/27/2012   Procedure: HARDWARE REMOVAL;  Surgeon: Mcarthur Rossetti, MD;  Location: [facility] ORS;  Service: Orthopedics;  Laterality: Left;  . TIBIA IM NAIL INSERTION  01/27/2012   Procedure: INTRAMEDULLARY (IM) NAIL TIBIAL;  Surgeon: Mcarthur Rossetti, MD;  Location: [facility]S;  Service: Orthopedics;  Laterality: Left;  Removal of IM  Rod and Screws Left Tibia with Exchange Nail, Allograft Bone Graft Left Tibia    Social History  reports that he has been smoking cigarettes.  He has a 24.00 pack-year smoking history. He quit smokeless tobacco use about 10 years ago. He reports that he drinks alcohol. He reports that he has current or past drug history. Drug: Cocaine.  Allergies  Allergen Reactions  . Acetaminophen  Other (See Comments)    Due to Ulcers/ Liver Damage  . Morphine And Related Itching    Family History  Problem Relation Age of Onset  . Diabetes Mother     Prior to Admission medications   Medication Sig Start Date End Date Taking? Authorizing Provider  cephALEXin (KEFLEX) 500 MG capsule Take 1 capsule (500 mg total) by mouth 4 (four) times daily. 05/16/17   Palumbo, April, MD  doxycycline (VIBRAMYCIN) 100 MG capsule Take 1 capsule (100 mg total) by mouth 2 (two) times daily. 05/01/17   Lily Kocher, PA-C  DULoxetine (CYMBALTA) 30 MG capsule Take 1 capsule (30 mg total) by mouth daily. For depression 01/20/17   Patrecia Pour, NP  lidocaine (LIDODERM) 5 % Place 1 patch onto the skin daily. Remove & Discard patch within 12 hours or as directed by MD 05/16/17   Randal Buba, April, MD  methocarbamol (ROBAXIN) 500 MG tablet Take 1 tablet (500 mg total) by mouth 2 (two) times daily. 05/15/17   Ashley Murrain, NP  naproxen (NAPROSYN) 500 MG tablet Take 1 tablet (500 mg total) by mouth 2 (two) times daily. 05/15/17   Ashley Murrain, NP  QUEtiapine (SEROQUEL) 100 MG tablet Take 1 tablet (100 mg total) by mouth 2 (two) times daily. 01/20/17   Patrecia Pour, NP    Physical Exam: Vitals:   05/17/17 0220 05/17/17 0728  BP: (!) 152/89 133/90  Pulse: 100 88  Resp: 20 17  Temp: 98.4 F (36.9 C)   TempSrc: Oral   SpO2: 100% 99%   Constitutional: No fever, no nausea, no vomiting, no chest pain, no shortness of breath.  Patient without red flag symptoms for acute cord compression or neurologic impairment Eyes: PERTLA, lids and conjunctivae normal, no icterus ENMT: Mucous membranes are moist. Posterior pharynx clear of any exudate or lesions. No no thrush. Neck: supple, no masses, no thyromegaly, no JVD Respiratory: clear to auscultation bilaterally, no wheezing, no crackles. Normal respiratory effort. No accessory muscle use.  Cardiovascular: S1 & S2 heard, regular rate and rhythm, no murmurs / rubs /  gallops. No extremity edema. 2+ pedal pulses.  Abdomen: No distension, no tenderness, no masses palpated. No hepatosplenomegaly. Bowel sounds normal.  Musculoskeletal: no clubbing / cyanosis. No joint deformity upper and lower extremities. Good ROM in his extremities; patient with decreased range of motion secondary to pain increase trunk (especially lower back). Normal muscle tone.  Skin: no rashes, ulcers or induration; patient with some bruises affecting LE. Neurologic: CN 2-12 grossly intact. Sensation intact, DTR normal. Strength 4/5 in all 4 limbs due to poor effort.  Psychiatric: Normal judgment and insight. Alert and oriented x 3. Normal mood.   Labs on Admission: I have personally reviewed following labs and imaging studies  CBC: Recent Labs  Lab 05/17/17 0825  WBC 6.2  NEUTROABS 3.8  HGB 13.0  HCT 40.7  MCV 96.2  PLT 540*   Basic Metabolic Panel: Recent Labs  Lab 05/17/17 0825  NA 135  K 3.8  CL 101  CO2 27  GLUCOSE 107*  BUN  13  CREATININE 0.82  CALCIUM 9.0   Liver Function Tests: Recent Labs  Lab 05/17/17 0825  AST 19  ALT 37  ALKPHOS 35*  BILITOT 0.9  PROT 7.1  ALBUMIN 3.2*   Urine analysis:    Component Value Date/Time   COLORURINE YELLOW 05/16/2017 0130   APPEARANCEUR CLEAR 05/16/2017 0130   APPEARANCEUR Hazy 09/28/2013 1707   LABSPEC 1.014 05/16/2017 0130   LABSPEC 1.017 09/28/2013 1707   PHURINE 6.0 05/16/2017 0130   GLUCOSEU NEGATIVE 05/16/2017 0130   GLUCOSEU Negative 09/28/2013 1707   HGBUR SMALL (A) 05/16/2017 0130   BILIRUBINUR NEGATIVE 05/16/2017 0130   BILIRUBINUR Negative 09/28/2013 1707   KETONESUR 20 (A) 05/16/2017 0130   PROTEINUR 100 (A) 05/16/2017 0130   UROBILINOGEN 0.2 03/24/2014 1341   NITRITE NEGATIVE 05/16/2017 0130   LEUKOCYTESUR MODERATE (A) 05/16/2017 0130   LEUKOCYTESUR Negative 09/28/2013 1707    Radiological Exams on Admission: Dg Lumbar Spine Complete  Result Date: 05/15/2017 CLINICAL DATA:  Chronic low back  pain beginning last night.  Fall. EXAM: LUMBAR SPINE - COMPLETE 4+ VIEW COMPARISON:  CT abdomen and pelvis 06/15/2016. FINDINGS: Five non rib-bearing lumbar type vertebral bodies are present. Levoconvex curvature of the lumbar spine is centered at L2-3. Remote superior endplate fracture is again noted at L1. Endplate sclerotic changes are noted on the right at L 2-3. There is slight retrolisthesis at L2-3. No acute fracture traumatic subluxation is present. IMPRESSION: 1. No acute abnormality. 2. 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 3. Remote superior endplate L1 fracture. Electronically Signed   By: San Morelle M.D.   On: 05/15/2017 14:20   Mr Lumbar Spine W Wo Contrast  Result Date: 05/17/2017 CLINICAL DATA:  Low back pain for 3-4 days. No known injury. The patient suffered a fall down steps 05/14/2017. EXAM: MRI LUMBAR SPINE WITHOUT AND WITH CONTRAST TECHNIQUE: Multiplanar and multiecho pulse sequences of the lumbar spine were obtained without and with intravenous contrast. CONTRAST:  15 ml MULTIHANCE GADOBENATE DIMEGLUMINE 529 MG/ML IV SOLN COMPARISON:  CT abdomen and pelvis 06/15/2016 and 02/02/2016. CT lumbar spine 05/17/2017. FINDINGS: Segmentation:  Standard. Alignment:  Maintained. Vertebrae: Mild, chronic anterior superior endplate compression fracture of L1 is unchanged. There is marrow edema and enhancement throughout the L2 and L3 vertebral bodies. Degenerative endplate signal change is seen at L5-S1. Conus medullaris and cauda equina: Conus extends to the L1 level. Conus and cauda equina appear normal. Paraspinal and other soft tissues: Negative. Disc levels: T10-11 and T11-12 are imaged in the sagittal plane only. Disc bulging is seen at both levels but the central canal and foramina appear open. T12-L1: Negative. L1-2: Shallow disc bulge without central canal or foraminal stenosis. L2-3: There is a shallow disc bulge without central  canal or foraminal stenosis. There is paraspinous inflammatory change centered about the disc interspace. No abscess is identified. There is no fluid in the disc interspace. L3-4: Shallow disc bulge and a small left foraminal protrusion. The central canal and right foramen are widely patent. Mild left foraminal narrowing is noted. L4-5: Shallow disc bulge and mild-to-moderate facet degenerative change without central canal or foraminal stenosis. L5-S1: Minimal disc bulge and a small left foraminal protrusion. There is mild left foraminal narrowing. The central canal and right foramen are widely patent. IMPRESSION: Edema and enhancement throughout the L2 and L3 vertebral bodies with surrounding paraspinous inflammatory change most consistent with discitis and osteomyelitis. Negative for abscess or central canal compromise. Chronic L1  anterior, superior endplate compression fracture. Mild degenerative disc disease without central canal stenosis as described above. Electronically Signed   By: Inge Rise M.D.   On: 05/17/2017 07:35   Ct L-spine No Charge  Addendum Date: 05/17/2017   ADDENDUM REPORT: 05/17/2017 05:25 ADDENDUM: Note findings and recommendations on concurrent CT of abdomen and pelvis indicating haziness of prevertebral soft tissues at the L2-3 level. Electronically Signed   By: Kristine Garbe M.D.   On: 05/17/2017 05:25   Result Date: 05/17/2017 CLINICAL DATA:  42 y/o M; lower back pain starting 3-4 days ago. Recent fall. EXAM: CT LUMBAR SPINE WITHOUT CONTRAST TECHNIQUE: Multidetector CT imaging of the lumbar spine was performed without intravenous contrast administration. Multiplanar CT image reconstructions were also generated. COMPARISON:  Concurrent and 01/05/2017 CT abdomen and pelvis. FINDINGS: Segmentation: 5 lumbar type vertebrae. Alignment: Mild lumbar levocurvature with apex at L3. Normal lumbar lordosis without listhesis. Vertebrae: Stable mild anterior compression deformity  of L1 vertebral body. Stable severe discogenic degenerative changes of L2-3 opposing endplates. No acute fracture or new loss of vertebral body height. Paraspinal and other soft tissues: Please refer to concurrent CT of the abdomen and pelvis. Disc levels: Moderate L2-3 loss of intervertebral disc space height and multilevel mild loss of intervertebral disc space height. Disc bulges and endplate marginal osteophytes result in mild foraminal stenosis bilaterally at L1 through L4 and left L4-5. There is moderate stenosis at the left L5-S1 neural foramen. No high-grade bony canal stenosis. IMPRESSION: 1. No acute fracture or malalignment. 2. Stable chronic L1 mild anterior compression deformity. 3. Lumbar spondylosis with discogenic degenerative changes greatest at the L2-3 level are stable. Moderate left L5-S1 neural foraminal stenosis. Multilevel mild neural foraminal stenosis. No high-grade bony canal stenosis. Electronically Signed: By: Kristine Garbe M.D. On: 05/17/2017 03:51   Ct Renal Stone Study  Result Date: 05/17/2017 CLINICAL DATA:  42 year old male with flank pain. Concern for renal stone. EXAM: CT ABDOMEN AND PELVIS WITHOUT CONTRAST TECHNIQUE: Multidetector CT imaging of the abdomen and pelvis was performed following the standard protocol without IV contrast. COMPARISON:  Lumbar spine radiograph dated 05/15/2017 and CT of the abdomen pelvis dated 01/05/2017 FINDINGS: Evaluation of this exam is limited in the absence of intravenous contrast. Lower chest: There is mild cardiomegaly. There is a 17 mm left lower lobe subpleural nodule as seen on the prior CT follow-up as recommended on the report of the prior CT. No intra-abdominal free air or free fluid. Hepatobiliary: No focal liver abnormality is seen. No gallstones, gallbladder wall thickening, or biliary dilatation. Pancreas: Unremarkable. No pancreatic ductal dilatation or surrounding inflammatory changes. Spleen: Top-normal spleen size.  Stable surgical clips in the inferior spleen. Adrenals/Urinary Tract: The adrenal glands are unremarkable. There is a nonobstructing stone in the interpolar aspect of the right kidney measuring 7 mm. There is no hydronephrosis. A 3 mm nonobstructing left renal upper pole calculus and probable additional punctate nonobstructing stones noted. There is no hydronephrosis on either side. Subcentimeter left renal hypodense lesion is not characterized. The visualized ureters and urinary bladder appear unremarkable. Stomach/Bowel: There is no bowel obstruction or active inflammation. The appendix is normal. Vascular/Lymphatic: The abdominal aorta and IVC are grossly unremarkable on this noncontrast CT. No portal venous gas. There is no adenopathy. Reproductive: The prostate and seminal vesicles are grossly unremarkable. No pelvic mass. Other: None Musculoskeletal: There is old compression fracture of the superior endplate of L1 with approximately 40% loss of vertebral body height anteriorly. There is sclerotic changes  of the inferior L2 and superior L3 with areas of endplate irregularity at L2-L3. There is haziness of the perivertebral soft tissues primarily at L2-L3. The perivertebral soft tissue haziness appears more pronounced or new compared to the prior CT. An infectious process involving the spine and disc at this level is not excluded. Further evaluation with MRI without and with contrast is advised. There is no acute fracture. IMPRESSION: 1. There is sclerotic changes primarily at L2-L3 with irregularity of the endplate at this level. There is associated haziness of the perivertebral soft tissues, new or more prominent compared to the prior CT. Findings concerning for an infectious process/spondylo discitis. Further evaluation with MRI without and with contrast is recommended. 2. Degenerative changes of the spine with old L1 superior endplate compression fracture. No acute fracture or subluxation. 3. Bilateral  nonobstructing renal calculi measure up to 7 mm in the interpolar aspect of the right kidney. No hydronephrosis. Electronically Signed   By: Anner Crete M.D.   On: 05/17/2017 04:59    EKG:  None   Assessment/Plan 1-Acute discitis/osteomyelitis of lumbar spine (HCC) -New findings of discitis and ostium myelitis on CT and MRI. -Patient without signs or findings for systemic infection -Case has been discussed with neurosurgery and they recommend aspiration by IR and involvement on ID to determine which antibiotics and for how long to be given. -At this moment patient will be transferred to [facility] and noted to have IR and ID involving his care. -Given the lack of systemic symptoms for infection will hold on initiating antibiotics if possible until aspiration has been done. -Provide as needed pain medication and muscle relaxant to assist with discomfort. -Physical therapy to help assessing his capacity performing activities of daily living once pain under better control.. -check HIV  2-Hepatitis C -LFTs within normal limits -Check hepatitis C quantitative RNA to determine if underlying chronic infection is present and treatment needed  3-opioid use disorder, moderate, dependence ([place]) -try to minimize use of narcotics and despite pain do not increase dose if possible.  4-GERD (gastroesophageal reflux disease) -will continue the use of PPI  5-Bipolar disorder (HCC) -stable overall -no SI or hallucinations  -continue seroquel and cymbalta   6-tobacco abuse and polysubstance abuse  -I have discussed tobacco cessation with the patient.  I have counseled the patient regarding the negative impacts of continued tobacco use including but not limited to lung cancer, COPD, and cardiovascular disease.  I have discussed alternatives to tobacco and modalities that may help facilitate tobacco cessation including but not limited to biofeedback, hypnosis, and medications.  Total time  spent with tobacco counseling was 5 minutes. -nicotine patch ordered  -cessation counseling for IVD use and cocaine also provided. -SW consulted to provide outpatient resources.  -UDS positive for cocaine   Time: 70 minutes   DVT prophylaxis: Heparin Code Status: Full code Family Communication: Mother at bedside Disposition Plan: To be determined; but hoping to discharge back home once treatment for acute osteomyelitis decided and pain is controlled.  Consults called: IR and ID (Dr. Johnnye Sima)  Admission status: inpatient, LOS > 2 midnights, med-surg bed     Barton Dubois MD [facility] Pager [phone]  If 7PM-7AM, please contact night-coverage [email or web address] Password [email or web address]  05/17/2017, 9:37 AM

## 2017-05-17 NOTE — ED Notes (Signed)
Pt calling out for nurse..  Can hear pt hollering curse words from another room.

## 2017-05-17 NOTE — ED Notes (Signed)
Unable to get vital signs.  Pt refusing to keep cuff on and will not relax arm.

## 2017-05-17 NOTE — ED Notes (Signed)
Pt yelling because the rails on the stretcher were up and he had to get up to use the bathroom.  Pt now back in bed but refuses to let staff raise both side rails.  Staff voiced concern about pt falling if the rails were left down but pt says, "I'm not going to fall."  Pt refuses to let staff raise both side rails.

## 2017-05-17 NOTE — ED Notes (Signed)
Pt not able to turn on back for post void residual.

## 2017-05-17 NOTE — Progress Notes (Signed)
Received call from EDP, Dr Manus Gunning, regarding patient 42 year old male, history IVDU, presented with low back pain. Reviewed MRI which shows discitis/osteomyelitis L2 & L3. There is no epidural abscess or central canal compromise.  Reported neuro intact. No role for NS intervention.  Admit to [facility].  Rec consult to IR for possible aspiration. Rec consult to ID for antibiotics. Hold abx until aspiration if possible.  Please call for any concerns.

## 2017-05-18 ENCOUNTER — Encounter (HOSPITAL_COMMUNITY): Payer: Self-pay | Admitting: Student

## 2017-05-18 ENCOUNTER — Inpatient Hospital Stay (HOSPITAL_COMMUNITY): Payer: Self-pay

## 2017-05-18 DIAGNOSIS — Z885 Allergy status to narcotic agent status: Secondary | ICD-10-CM

## 2017-05-18 DIAGNOSIS — Z886 Allergy status to analgesic agent status: Secondary | ICD-10-CM

## 2017-05-18 DIAGNOSIS — M4640 Discitis, unspecified, site unspecified: Secondary | ICD-10-CM

## 2017-05-18 DIAGNOSIS — F1120 Opioid dependence, uncomplicated: Secondary | ICD-10-CM

## 2017-05-18 DIAGNOSIS — R7881 Bacteremia: Secondary | ICD-10-CM

## 2017-05-18 DIAGNOSIS — B955 Unspecified streptococcus as the cause of diseases classified elsewhere: Secondary | ICD-10-CM

## 2017-05-18 DIAGNOSIS — F1490 Cocaine use, unspecified, uncomplicated: Secondary | ICD-10-CM

## 2017-05-18 DIAGNOSIS — Z8619 Personal history of other infectious and parasitic diseases: Secondary | ICD-10-CM

## 2017-05-18 DIAGNOSIS — F319 Bipolar disorder, unspecified: Secondary | ICD-10-CM

## 2017-05-18 DIAGNOSIS — M4626 Osteomyelitis of vertebra, lumbar region: Principal | ICD-10-CM

## 2017-05-18 DIAGNOSIS — F17210 Nicotine dependence, cigarettes, uncomplicated: Secondary | ICD-10-CM

## 2017-05-18 DIAGNOSIS — G8929 Other chronic pain: Secondary | ICD-10-CM

## 2017-05-18 DIAGNOSIS — M4646 Discitis, unspecified, lumbar region: Secondary | ICD-10-CM

## 2017-05-18 LAB — BLOOD CULTURE ID PANEL (REFLEXED)
Acinetobacter baumannii: NOT DETECTED
CANDIDA PARAPSILOSIS: NOT DETECTED
CANDIDA TROPICALIS: NOT DETECTED
Candida albicans: NOT DETECTED
Candida glabrata: NOT DETECTED
Candida krusei: NOT DETECTED
ENTEROBACTERIACEAE SPECIES: NOT DETECTED
ENTEROCOCCUS SPECIES: NOT DETECTED
Enterobacter cloacae complex: NOT DETECTED
Escherichia coli: NOT DETECTED
HAEMOPHILUS INFLUENZAE: NOT DETECTED
KLEBSIELLA OXYTOCA: NOT DETECTED
Klebsiella pneumoniae: NOT DETECTED
LISTERIA MONOCYTOGENES: NOT DETECTED
NEISSERIA MENINGITIDIS: NOT DETECTED
Proteus species: NOT DETECTED
Pseudomonas aeruginosa: NOT DETECTED
SERRATIA MARCESCENS: NOT DETECTED
STAPHYLOCOCCUS AUREUS BCID: NOT DETECTED
STREPTOCOCCUS PYOGENES: NOT DETECTED
STREPTOCOCCUS SPECIES: DETECTED — AB
Staphylococcus species: NOT DETECTED
Streptococcus agalactiae: NOT DETECTED
Streptococcus pneumoniae: NOT DETECTED

## 2017-05-18 LAB — CBC
HCT: 41.7 % (ref 39.0–52.0)
Hemoglobin: 13.8 g/dL (ref 13.0–17.0)
MCH: 31.8 pg (ref 26.0–34.0)
MCHC: 33.1 g/dL (ref 30.0–36.0)
MCV: 96.1 fL (ref 78.0–100.0)
PLATELETS: 152 10*3/uL (ref 150–400)
RBC: 4.34 MIL/uL (ref 4.22–5.81)
RDW: 14 % (ref 11.5–15.5)
WBC: 6.5 10*3/uL (ref 4.0–10.5)

## 2017-05-18 LAB — COMPREHENSIVE METABOLIC PANEL
ALBUMIN: 2.8 g/dL — AB (ref 3.5–5.0)
ALT: 38 U/L (ref 17–63)
AST: 30 U/L (ref 15–41)
Alkaline Phosphatase: 37 U/L — ABNORMAL LOW (ref 38–126)
Anion gap: 6 (ref 5–15)
BUN: 8 mg/dL (ref 6–20)
CO2: 27 mmol/L (ref 22–32)
CREATININE: 0.81 mg/dL (ref 0.61–1.24)
Calcium: 8.6 mg/dL — ABNORMAL LOW (ref 8.9–10.3)
Chloride: 99 mmol/L — ABNORMAL LOW (ref 101–111)
GFR calc Af Amer: >60 mL/min (ref >60)
GLUCOSE: 138 mg/dL — AB (ref 65–99)
POTASSIUM: 3.5 mmol/L (ref 3.5–5.1)
Sodium: 132 mmol/L — ABNORMAL LOW (ref 135–145)
Total Bilirubin: 0.9 mg/dL (ref 0.3–1.2)
Total Protein: 6.7 g/dL (ref 6.5–8.1)

## 2017-05-18 LAB — HIV ANTIBODY (ROUTINE TESTING W REFLEX): HIV SCREEN 4TH GENERATION: NONREACTIVE

## 2017-05-18 LAB — PROTIME-INR
INR: 1.11
Prothrombin Time: 14.2 seconds (ref 11.4–15.2)

## 2017-05-18 LAB — MRSA PCR SCREENING: MRSA by PCR: POSITIVE — AB

## 2017-05-18 LAB — APTT: APTT: 31 s (ref 24–36)

## 2017-05-18 MED ORDER — HEPARIN SODIUM (PORCINE) 5000 UNIT/ML IJ SOLN
5000.0000 [IU] | Freq: Three times a day (TID) | INTRAMUSCULAR | Status: DC
  Administered 2017-05-19 – 2017-05-22 (×10): 5000 [IU] via SUBCUTANEOUS
  Filled 2017-05-18 (×10): qty 1

## 2017-05-18 MED ORDER — HEPARIN SODIUM (PORCINE) 5000 UNIT/ML IJ SOLN
5000.0000 [IU] | Freq: Three times a day (TID) | INTRAMUSCULAR | Status: DC
  Administered 2017-05-18: 5000 [IU] via SUBCUTANEOUS
  Filled 2017-05-18: qty 1

## 2017-05-18 MED ORDER — ONDANSETRON HCL 4 MG PO TABS
4.0000 mg | ORAL_TABLET | Freq: Four times a day (QID) | ORAL | Status: DC | PRN
Start: 2017-05-18 — End: 2017-05-26

## 2017-05-18 MED ORDER — FENTANYL CITRATE (PF) 100 MCG/2ML IJ SOLN
12.5000 ug | Freq: Once | INTRAMUSCULAR | Status: AC
  Administered 2017-05-18: 12.5 ug via INTRAVENOUS
  Filled 2017-05-18: qty 2

## 2017-05-18 MED ORDER — QUETIAPINE FUMARATE 25 MG PO TABS
100.0000 mg | ORAL_TABLET | Freq: Two times a day (BID) | ORAL | Status: DC
  Administered 2017-05-18 – 2017-05-26 (×16): 100 mg via ORAL
  Filled 2017-05-18 (×17): qty 4

## 2017-05-18 MED ORDER — POLYETHYLENE GLYCOL 3350 17 G PO PACK: 17.0000 g | PACK | Freq: Every day | ORAL | Status: DC | PRN

## 2017-05-18 MED ORDER — LORAZEPAM 0.5 MG PO TABS
0.5000 mg | ORAL_TABLET | ORAL | Status: DC | PRN
  Administered 2017-05-18 (×2): 0.5 mg via ORAL
  Filled 2017-05-18 (×2): qty 1

## 2017-05-18 MED ORDER — METHOCARBAMOL 1000 MG/10ML IJ SOLN
500.0000 mg | Freq: Three times a day (TID) | INTRAVENOUS | Status: DC
  Administered 2017-05-18 – 2017-05-20 (×6): 500 mg via INTRAVENOUS
  Filled 2017-05-18: qty 5
  Filled 2017-05-18: qty 550
  Filled 2017-05-18 (×4): qty 5
  Filled 2017-05-18: qty 550

## 2017-05-18 MED ORDER — SODIUM CHLORIDE 0.9 % IV SOLN
2.0000 g | INTRAVENOUS | Status: DC
  Administered 2017-05-18 – 2017-05-26 (×9): 2 g via INTRAVENOUS
  Filled 2017-05-18 (×9): qty 20

## 2017-05-18 MED ORDER — ONDANSETRON HCL 4 MG/2ML IJ SOLN: 4.0000 mg | Freq: Four times a day (QID) | INTRAMUSCULAR | Status: DC | PRN

## 2017-05-18 MED ORDER — SENNA 8.6 MG PO TABS
1.0000 | ORAL_TABLET | Freq: Two times a day (BID) | ORAL | Status: DC
  Administered 2017-05-18 – 2017-05-19 (×3): 8.6 mg via ORAL
  Filled 2017-05-18 (×5): qty 1

## 2017-05-18 MED ORDER — CHLORHEXIDINE GLUCONATE CLOTH 2 % EX PADS
6.0000 | MEDICATED_PAD | Freq: Every day | CUTANEOUS | Status: AC
  Administered 2017-05-20 – 2017-05-23 (×4): 6 via TOPICAL

## 2017-05-18 MED ORDER — DULOXETINE HCL 30 MG PO CPEP
30.0000 mg | ORAL_CAPSULE | Freq: Every day | ORAL | Status: DC
  Administered 2017-05-19 – 2017-05-21 (×3): 30 mg via ORAL
  Filled 2017-05-18 (×4): qty 1

## 2017-05-18 MED ORDER — MUPIROCIN 2 % EX OINT
1.0000 "application " | TOPICAL_OINTMENT | Freq: Two times a day (BID) | CUTANEOUS | Status: AC
  Administered 2017-05-19 – 2017-05-23 (×10): 1 via NASAL
  Filled 2017-05-18 (×4): qty 22

## 2017-05-18 MED ORDER — FENTANYL CITRATE (PF) 100 MCG/2ML IJ SOLN
25.0000 ug | INTRAMUSCULAR | Status: DC | PRN
  Administered 2017-05-18 – 2017-05-19 (×5): 25 ug via INTRAVENOUS
  Filled 2017-05-18 (×5): qty 2

## 2017-05-18 MED ORDER — SODIUM CHLORIDE 0.9 % IV SOLN
INTRAVENOUS | Status: DC
  Administered 2017-05-18 – 2017-05-20 (×4): via INTRAVENOUS

## 2017-05-18 NOTE — Consult Note (Signed)
Chief Complaint: Patient was seen in consultation today for osteomyelitis  Referring Physician(s): Dr. Gwenlyn Perking  Supervising Physician: Julieanne Cotton  Patient Status: [facility] - In-pt  History of Present Illness: Joshua Thomas is a 42 y.o. male with history of ADHD, bipolar 1 disorder, chronic pain, depression, GERD, Hep C, and IVDU who presented to [facility] with back pain.    MRI 05/17/17: Edema and enhancement throughout the L2 and L3 vertebral bodies with surrounding paraspinous inflammatory change most consistent with discitis and osteomyelitis. Negative for abscess or central canal compromise.  Chronic L1 anterior, superior endplate compression fracture.  Mild degenerative disc disease without central canal stenosis as described above.  IR consulted for disc aspiration at the request of Dr. Gwenlyn Perking. Patient has been NPO.  He did receive SQ heparin at 5AM.   Past Medical History:  Diagnosis Date  . ADHD (attention deficit hyperactivity disorder)   . Bipolar 1 disorder (HCC)   . Chronic back pain   . Chronic pain    chronic l leg pain  . DDD (degenerative disc disease), lumbar   . Degenerative disc disease, lumbar   . Depression   . GERD (gastroesophageal reflux disease)   . Hepatitis C   . Hepatitis C   . History of stomach ulcers   . Sciatica   . Substance abuse [facility])     Past Surgical History:  Procedure Laterality Date  . FRACTURE SURGERY    . HARDWARE REMOVAL  01/27/2012   Procedure: HARDWARE REMOVAL;  Surgeon: Kathryne Hitch, MD;  Location: [facility] ORS;  Service: Orthopedics;  Laterality: Left;  . TIBIA IM NAIL INSERTION  01/27/2012   Procedure: INTRAMEDULLARY (IM) NAIL TIBIAL;  Surgeon: Kathryne Hitch, MD;  Location: [facility] ORS;  Service: Orthopedics;  Laterality: Left;  Removal of IM Rod and Screws Left Tibia with Exchange Nail, Allograft Bone Graft Left Tibia    Allergies: Acetaminophen and Morphine and related  Medications: Prior to  Admission medications   Medication Sig Start Date End Date Taking? Authorizing Provider  cephALEXin (KEFLEX) 500 MG capsule Take 1 capsule (500 mg total) by mouth 4 (four) times daily. 05/16/17   Palumbo, April, MD  doxycycline (VIBRAMYCIN) 100 MG capsule Take 1 capsule (100 mg total) by mouth 2 (two) times daily. 05/01/17   Ivery Quale, PA-C  DULoxetine (CYMBALTA) 30 MG capsule Take 1 capsule (30 mg total) by mouth daily. For depression 01/20/17   Charm Rings, NP  lidocaine (LIDODERM) 5 % Place 1 patch onto the skin daily. Remove & Discard patch within 12 hours or as directed by MD 05/16/17   Nicanor Alcon, April, MD  methocarbamol (ROBAXIN) 500 MG tablet Take 1 tablet (500 mg total) by mouth 2 (two) times daily. 05/15/17   Janne Napoleon, NP  naproxen (NAPROSYN) 500 MG tablet Take 1 tablet (500 mg total) by mouth 2 (two) times daily. 05/15/17   Janne Napoleon, NP  QUEtiapine (SEROQUEL) 100 MG tablet Take 1 tablet (100 mg total) by mouth 2 (two) times daily. 01/20/17   Charm Rings, NP     Family History  Problem Relation Age of Onset  . Diabetes Mother     Social History   Socioeconomic History  . Marital status: Single    Spouse name: Not on file  . Number of children: Not on file  . Years of education: Not on file  . Highest education level: Not on file  Occupational History  . Not on file  Social  Needs  . Financial resource strain: Not on file  . Food insecurity:    Worry: Not on file    Inability: Not on file  . Transportation needs:    Medical: Not on file    Non-medical: Not on file  Tobacco Use  . Smoking status: Current Every Day Smoker    Packs/day: 1.00    Years: 24.00    Pack years: 24.00    Types: Cigarettes  . Smokeless tobacco: Former Neurosurgeon    Quit date: 08/08/2006  Substance and Sexual Activity  . Alcohol use: Yes    Comment: liquor  . Drug use: Not Currently    Types: Cocaine    Comment: last used few mos  . Sexual activity: Not on file  Lifestyle  . Physical  activity:    Days per week: Not on file    Minutes per session: Not on file  . Stress: Not on file  Relationships  . Social connections:    Talks on phone: Not on file    Gets together: Not on file    Attends religious service: Not on file    Active member of club or organization: Not on file    Attends meetings of clubs or organizations: Not on file    Relationship status: Not on file  Other Topics Concern  . Not on file  Social History Narrative  . Not on file     Review of Systems: A 12 point ROS discussed and pertinent positives are indicated in the HPI above.  All other systems are negative.  Review of Systems  Constitutional: Negative for fatigue and fever.  Respiratory: Negative for cough and shortness of breath.   Cardiovascular: Negative for chest pain.  Gastrointestinal: Positive for abdominal distention. Negative for abdominal pain.  Musculoskeletal: Positive for back pain.  Psychiatric/Behavioral: Negative for behavioral problems and confusion.    Vital Signs: BP (!) 163/104 (BP Location: Right Arm)   Pulse 93   Temp 99.6 F (37.6 C)   Resp 18   Ht 5\' 3"  (1.6 m)   Wt 155 lb 6.8 oz (70.5 kg)   SpO2 100%   BMI 27.53 kg/m   Physical Exam  Constitutional: He is oriented to person, place, and time. He appears well-developed.  Cardiovascular: Normal rate, regular rhythm and normal heart sounds.  Pulmonary/Chest: Effort normal and breath sounds normal. No respiratory distress.  Musculoskeletal:  Refuses to move due to pain  Neurological: He is alert and oriented to person, place, and time.  Skin: Skin is warm and dry.  Psychiatric: He has a normal mood and affect. His behavior is normal. Judgment and thought content normal.  Nursing note and vitals reviewed.    MD Evaluation Airway: WNL Heart: WNL Abdomen: WNL Chest/ Lungs: WNL ASA  Classification: 3 Mallampati/Airway Score: One   Imaging: Dg Lumbar Spine Complete  Result Date:  05/15/2017 CLINICAL DATA:  Chronic low back pain beginning last night.  Fall. EXAM: LUMBAR SPINE - COMPLETE 4+ VIEW COMPARISON:  CT abdomen and pelvis 06/15/2016. FINDINGS: Five non rib-bearing lumbar type vertebral bodies are present. Levoconvex curvature of the lumbar spine is centered at L2-3. Remote superior endplate fracture is again noted at L1. Endplate sclerotic changes are noted on the right at L 2-3. There is slight retrolisthesis at L2-3. No acute fracture traumatic subluxation is present. IMPRESSION: 1. No acute abnormality. 2. 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 3. Remote superior endplate L1 fracture. Electronically  Signed   By: Marin Roberts M.D.   On: 05/15/2017 14:20   Mr Lumbar Spine W Wo Contrast  Result Date: 05/17/2017 CLINICAL DATA:  Low back pain for 3-4 days. No known injury. The patient suffered a fall down steps 05/14/2017. EXAM: MRI LUMBAR SPINE WITHOUT AND WITH CONTRAST TECHNIQUE: Multiplanar and multiecho pulse sequences of the lumbar spine were obtained without and with intravenous contrast. CONTRAST:  15 ml MULTIHANCE GADOBENATE DIMEGLUMINE 529 MG/ML IV SOLN COMPARISON:  CT abdomen and pelvis 06/15/2016 and 02/02/2016. CT lumbar spine 05/17/2017. FINDINGS: Segmentation:  Standard. Alignment:  Maintained. Vertebrae: Mild, chronic anterior superior endplate compression fracture of L1 is unchanged. There is marrow edema and enhancement throughout the L2 and L3 vertebral bodies. Degenerative endplate signal change is seen at L5-S1. Conus medullaris and cauda equina: Conus extends to the L1 level. Conus and cauda equina appear normal. Paraspinal and other soft tissues: Negative. Disc levels: T10-11 and T11-12 are imaged in the sagittal plane only. Disc bulging is seen at both levels but the central canal and foramina appear open. T12-L1: Negative. L1-2: Shallow disc bulge without central canal or foraminal stenosis. L2-3:  There is a shallow disc bulge without central canal or foraminal stenosis. There is paraspinous inflammatory change centered about the disc interspace. No abscess is identified. There is no fluid in the disc interspace. L3-4: Shallow disc bulge and a small left foraminal protrusion. The central canal and right foramen are widely patent. Mild left foraminal narrowing is noted. L4-5: Shallow disc bulge and mild-to-moderate facet degenerative change without central canal or foraminal stenosis. L5-S1: Minimal disc bulge and a small left foraminal protrusion. There is mild left foraminal narrowing. The central canal and right foramen are widely patent. IMPRESSION: Edema and enhancement throughout the L2 and L3 vertebral bodies with surrounding paraspinous inflammatory change most consistent with discitis and osteomyelitis. Negative for abscess or central canal compromise. Chronic L1 anterior, superior endplate compression fracture. Mild degenerative disc disease without central canal stenosis as described above. Electronically Signed   By: Drusilla Kanner M.D.   On: 05/17/2017 07:35   Ct L-spine No Charge  Addendum Date: 05/17/2017   ADDENDUM REPORT: 05/17/2017 05:25 ADDENDUM: Note findings and recommendations on concurrent CT of abdomen and pelvis indicating haziness of prevertebral soft tissues at the L2-3 level. Electronically Signed   By: Mitzi Hansen M.D.   On: 05/17/2017 05:25   Result Date: 05/17/2017 CLINICAL DATA:  42 y/o M; lower back pain starting 3-4 days ago. Recent fall. EXAM: CT LUMBAR SPINE WITHOUT CONTRAST TECHNIQUE: Multidetector CT imaging of the lumbar spine was performed without intravenous contrast administration. Multiplanar CT image reconstructions were also generated. COMPARISON:  Concurrent and 01/05/2017 CT abdomen and pelvis. FINDINGS: Segmentation: 5 lumbar type vertebrae. Alignment: Mild lumbar levocurvature with apex at L3. Normal lumbar lordosis without listhesis.  Vertebrae: Stable mild anterior compression deformity of L1 vertebral body. Stable severe discogenic degenerative changes of L2-3 opposing endplates. No acute fracture or new loss of vertebral body height. Paraspinal and other soft tissues: Please refer to concurrent CT of the abdomen and pelvis. Disc levels: Moderate L2-3 loss of intervertebral disc space height and multilevel mild loss of intervertebral disc space height. Disc bulges and endplate marginal osteophytes result in mild foraminal stenosis bilaterally at L1 through L4 and left L4-5. There is moderate stenosis at the left L5-S1 neural foramen. No high-grade bony canal stenosis. IMPRESSION: 1. No acute fracture or malalignment. 2. Stable chronic L1 mild anterior compression deformity. 3. Lumbar  spondylosis with discogenic degenerative changes greatest at the L2-3 level are stable. Moderate left L5-S1 neural foraminal stenosis. Multilevel mild neural foraminal stenosis. No high-grade bony canal stenosis. Electronically Signed: By: Mitzi Hansen M.D. On: 05/17/2017 03:51   Ct Renal Stone Study  Result Date: 05/17/2017 CLINICAL DATA:  42 year old male with flank pain. Concern for renal stone. EXAM: CT ABDOMEN AND PELVIS WITHOUT CONTRAST TECHNIQUE: Multidetector CT imaging of the abdomen and pelvis was performed following the standard protocol without IV contrast. COMPARISON:  Lumbar spine radiograph dated 05/15/2017 and CT of the abdomen pelvis dated 01/05/2017 FINDINGS: Evaluation of this exam is limited in the absence of intravenous contrast. Lower chest: There is mild cardiomegaly. There is a 17 mm left lower lobe subpleural nodule as seen on the prior CT follow-up as recommended on the report of the prior CT. No intra-abdominal free air or free fluid. Hepatobiliary: No focal liver abnormality is seen. No gallstones, gallbladder wall thickening, or biliary dilatation. Pancreas: Unremarkable. No pancreatic ductal dilatation or surrounding  inflammatory changes. Spleen: Top-normal spleen size. Stable surgical clips in the inferior spleen. Adrenals/Urinary Tract: The adrenal glands are unremarkable. There is a nonobstructing stone in the interpolar aspect of the right kidney measuring 7 mm. There is no hydronephrosis. A 3 mm nonobstructing left renal upper pole calculus and probable additional punctate nonobstructing stones noted. There is no hydronephrosis on either side. Subcentimeter left renal hypodense lesion is not characterized. The visualized ureters and urinary bladder appear unremarkable. Stomach/Bowel: There is no bowel obstruction or active inflammation. The appendix is normal. Vascular/Lymphatic: The abdominal aorta and IVC are grossly unremarkable on this noncontrast CT. No portal venous gas. There is no adenopathy. Reproductive: The prostate and seminal vesicles are grossly unremarkable. No pelvic mass. Other: None Musculoskeletal: There is old compression fracture of the superior endplate of L1 with approximately 40% loss of vertebral body height anteriorly. There is sclerotic changes of the inferior L2 and superior L3 with areas of endplate irregularity at L2-L3. There is haziness of the perivertebral soft tissues primarily at L2-L3. The perivertebral soft tissue haziness appears more pronounced or new compared to the prior CT. An infectious process involving the spine and disc at this level is not excluded. Further evaluation with MRI without and with contrast is advised. There is no acute fracture. IMPRESSION: 1. There is sclerotic changes primarily at L2-L3 with irregularity of the endplate at this level. There is associated haziness of the perivertebral soft tissues, new or more prominent compared to the prior CT. Findings concerning for an infectious process/spondylo discitis. Further evaluation with MRI without and with contrast is recommended. 2. Degenerative changes of the spine with old L1 superior endplate compression  fracture. No acute fracture or subluxation. 3. Bilateral nonobstructing renal calculi measure up to 7 mm in the interpolar aspect of the right kidney. No hydronephrosis. Electronically Signed   By: Elgie Collard M.D.   On: 05/17/2017 04:59    Labs:  CBC: Recent Labs    06/15/16 1543 01/18/17 1728 05/17/17 0825 05/18/17 0514  WBC 5.2 6.4 6.2 6.5  HGB 14.0 15.8 13.0 13.8  HCT 40.5 48.2 40.7 41.7  PLT 139* 182 133* 152    COAGS: Recent Labs    05/18/17 0514  INR 1.11  APTT 31    BMP: Recent Labs    06/15/16 1543 01/18/17 1728 05/17/17 0825 05/18/17 0514  NA 135 137 135 132*  K 3.8 3.7 3.8 3.5  CL 102 105 101 99*  CO2 27  GLUCOSE 144* 174* 107* 138*  BUN 12 23* 13 8  CALCIUM 9.3 9.2 9.0 8.6*  CREATININE 1.13 1.22 0.82 0.81  GFRNONAA >60 >60 >60 >60  GFRAA >60 >60 >60 >60    LIVER FUNCTION TESTS: Recent Labs    06/15/16 1543 01/18/17 1728 05/17/17 0825 05/18/17 0514  BILITOT 0.5 1.1 0.9 0.9  AST 72* 40 19 30  ALT 92* 54 37 38  ALKPHOS 56 46 35* 37*  PROT 7.3 7.6 7.1 6.7  ALBUMIN 3.6 3.6 3.2* 2.8*    TUMOR MARKERS: No results for input(s): AFPTM, CEA, CA199, CHROMGRNA in the last 8760 hours.  Assessment and Plan: Osteomyelitis, discitis of L2-L3 IR consulted for disc aspiration.  Patient has had positive blood cultures for strep so has already started abx. Patient in pain during visit.  Refuses to move or roll.  RN assisting with relaxer and pain relief.  Mother at bedside.  Discussed procedure with patient and mother at bedside who are agreeable to proceed.   Risks and benefits were discussed with the patient including, but not limited to bleeding, infection, and injury.  All of the patient's questions were answered, patient is agreeable to proceed.  Consent signed and in chart.  Thank you for this interesting consult.  I greatly enjoyed meeting JB DULWORTH and look forward to participating in their care.  A copy of this report  was sent to the requesting provider on this date.  Electronically Signed: Hoyt Koch, PA 05/18/2017, 1:08 PM   I spent a total of 40 Minutes    in face to face in clinical consultation, greater than 50% of which was counseling/coordinating care for discitis, osteomyelitis.

## 2017-05-18 NOTE — Progress Notes (Signed)
PHARMACY - PHYSICIAN COMMUNICATION CRITICAL VALUE ALERT - BLOOD CULTURE IDENTIFICATION (BCID)  Joshua Thomas is an 42 y.o. male who presented to [facility] on 05/17/2017 with a chief complaint of back pain  Assessment:   42 yo M presents with back pain. MRI showed osteo and discitis of L2 and L3. Was going to hold off on abx so IR could aspirate spine and get cx but now with 3/4 blood cx's positive with strep species. Discussed options with Dr. Allena Katz and Dr. Ninetta Lights over the phone. Can most likely assume this strep species bacteremia is secondary to spinal infection.  Name of physician (or Provider) Contacted: Leodis Binet.  Current antibiotics: None  Changes to prescribed antibiotics recommended:  Start ceftriaxone 2g IV Q24h now   Results for orders placed or performed during the hospital encounter of 05/17/17  Blood Culture ID Panel (Reflexed) (Collected: 05/17/2017  8:30 AM)  Result Value Ref Range   Enterococcus species NOT DETECTED NOT DETECTED   Listeria monocytogenes NOT DETECTED NOT DETECTED   Staphylococcus species NOT DETECTED NOT DETECTED   Staphylococcus aureus NOT DETECTED NOT DETECTED   Streptococcus species DETECTED (A) NOT DETECTED   Streptococcus agalactiae NOT DETECTED NOT DETECTED   Streptococcus pneumoniae NOT DETECTED NOT DETECTED   Streptococcus pyogenes NOT DETECTED NOT DETECTED   Acinetobacter baumannii NOT DETECTED NOT DETECTED   Enterobacteriaceae species NOT DETECTED NOT DETECTED   Enterobacter cloacae complex NOT DETECTED NOT DETECTED   Escherichia coli NOT DETECTED NOT DETECTED   Klebsiella oxytoca NOT DETECTED NOT DETECTED   Klebsiella pneumoniae NOT DETECTED NOT DETECTED   Proteus species NOT DETECTED NOT DETECTED   Serratia marcescens NOT DETECTED NOT DETECTED   Haemophilus influenzae NOT DETECTED NOT DETECTED   Neisseria meningitidis NOT DETECTED NOT DETECTED   Pseudomonas aeruginosa NOT DETECTED NOT DETECTED   Candida albicans NOT DETECTED NOT  DETECTED   Candida glabrata NOT DETECTED NOT DETECTED   Candida krusei NOT DETECTED NOT DETECTED   Candida parapsilosis NOT DETECTED NOT DETECTED   Candida tropicalis NOT DETECTED NOT DETECTED    Zyiere Rosemond J 05/18/2017  10:25 AM

## 2017-05-18 NOTE — Progress Notes (Signed)
PT Cancellation Note  Patient Details Name: Joshua Thomas MRN: [id number] DOB: Mar 15, 1975   Cancelled Treatment:    Reason Eval/Treat Not Completed: Patient at procedure or test/unavailable.  Heading to interventional radiology when PT arrived and will see when pt is available later.   Ivar Drape 05/18/2017, 1:45 PM   Samul Dada, PT MS Acute Rehab Dept. Number: [facility] [id number] and [facility] [phone]

## 2017-05-18 NOTE — Progress Notes (Signed)
Patient ID: Joshua Thomas, male   DOB: [birth year], 42 y.o.   MRN: [id number] INR. Due to the patients clinical condition and medical issues  It would be safer to do the procedure under  MAC or GA with anesthesia. Discussed with patients referring MD.and mother.. S.Nelma Phagan MD

## 2017-05-18 NOTE — Progress Notes (Signed)
[facility] Progress Note  Patient: Joshua Thomas ZOX:[id number]   PCP: Patient, No Pcp Per DOB: 1975/12/09   DOA: 05/17/2017   DOS: 05/18/2017   Date of Service: the patient was seen and examined on 05/18/2017  Subjective: Complaining of severe pain and crying.  No nausea no vomiting. Mentions that he does not use heroin frequently and last use was 1 week ago. He also does not take any narcotics on a regular basis. [place] controlled substance review suggests that the patient was on 10 mg oxycodone 4 times a day back in 2017 but no further scheduled prescription medication beyond that.  Brief hospital course: Pt. with PMH of bipolar disorder, chronic back pain, hepatitis C, GERD, active smoker, polysubstance abuse; admitted on 05/17/2017, presented with complaint of back pain, was found to have discitis. Currently further plan is IV antibiotics.  Assessment and Plan: 1.  Acute discitis.  With osteomyelitis of L2-3 Streptococcus bacteremia. ID consulted. Blood cultures positive. Sensitivities pending. MRI positive for acute discitis as well as osteomyelitis. Patient was initially seen in the [facility], transferred here for IR work-up. IR consulted and felt that the patient is at high risk for regular IR guided biopsy and recommended anesthesia. Infectious disease recommends that the patient does not need any biopsy since his blood cultures are positive for Streptococcus already. Echocardiogram ordered for bacteremia. HIV negative, work-up for  hep C pending.  2.  Pain control. Chronic back pain. Patient is requesting multiple rounds of IV narcotic pain medication. He mentions that 1 mg of Dilaudid did not help his pain although 12.5 mcg of fentanyl resolve the pain completely. I would not label this patient as opioid tolerant given that he does not use opiates on a regular basis currently. Based on this I would avoid escalating narcotic regimen any further. Recommendation is to  start the patient on Suboxone, will discuss with outpatient Suboxone management team tomorrow to see whether the patient qualifies. Placing the patient on scheduled Robaxin  3.  Polysubstance abuse. Possible withdrawal. PRN Ativan.  4.  Active smoker. Tobacco cessation discussed.  5.  Bipolar disorder. Continue Seroquel and Cymbalta although the patient refused at this morning.  Diet: regular diet DVT Prophylaxis: subcutaneous Heparin  Advance goals of care discussion: full code  Family Communication: family was present at bedside, at the time of interview. The pt provided permission to discuss medical plan with the family. Opportunity was given to ask question and all questions were answered satisfactorily.   Disposition:  Discharge to home.  Consultants: ID, IR, anesthesiology Procedures: none  Antibiotics: Anti-infectives (From admission, onward)   Start     Dose/Rate Route Frequency Ordered Stop   05/18/17 1100  cefTRIAXone (ROCEPHIN) 2 g in sodium chloride 0.9 % 100 mL IVPB     2 g 200 mL/hr over 30 Minutes Intravenous Every 24 hours 05/18/17 1006         Objective: Physical Exam: Vitals:   05/17/17 1957 05/17/17 2000 05/17/17 2157 05/18/17 0524  BP: (!) 151/101  (!) 157/89 (!) 163/104  Pulse: (!) 110 (!) 119 (!) 120 93  Resp: Temp:   99 F (37.2 C) 99.6 F (37.6 C)  TempSrc:   Oral   SpO2: 100% 100% 100% 100%  Weight:    70.5 kg (155 lb 6.8 oz)  Height:     (1.6 m)    Intake/TRASE BUNDAary (Last 24 hours) at 05/18/2017 1820 Last data filed at 05/18/2017  1500 Gross per 24 hour  Intake 902.5 ml  Output 1350 ml  Net -447.5 ml   Filed Weights   05/18/17 0524  Weight: 70.5 kg (155 lb 6.8 oz)   General: Alert, Awake and Oriented to Time, Place and Person. Appear in marked distress, affect irritable Eyes: PERRL, Conjunctiva normal ENT: Oral Mucosa clear moist. Neck: no JVD, no Abnormal Mass Or lumps Cardiovascular: S1 and S2 Present, no  Murmur, Peripheral Pulses Present Respiratory: normal respiratory effort, Bilateral Air entry equal and Decreased, no use of accessory muscle, Clear to Auscultation, no Crackles, no wheezes Abdomen: Bowel Sound present, Soft and no tenderness, n hernia Skin: ono redness, no Rash, no induration Extremities: no Pedal edema, no calf tenderness Neurologic: Grossly no focal neuro deficit. Bilaterally Equal motor strength  Data Reviewed: CBC: Recent Labs  Lab 05/17/17 0825 05/18/17 0514  WBC 6.2 6.5  NEUTROABS 3.8  --   HGB 13.0 13.8  HCT 40.7 41.7  MCV 96.2 96.1  PLT 133* 152   Basic Metabolic Panel: Recent Labs  Lab 05/17/17 0825 05/18/17 0514  NA 135 132*  K 3.8 3.5  CL 101 99*  CO2 27 27  GLUCOSE 107* 138*  BUN 13 8  CREATININE 0.82 0.81  CALCIUM 9.0 8.6*    Liver Function Tests: Recent Labs  Lab 05/17/17 0825 05/18/17 0514  AST 19 30  ALT 37 38  ALKPHOS 35* 37*  BILITOT 0.9 0.9  PROT 7.1 6.7  ALBUMIN 3.2* 2.8*   No results for input(s): LIPASE, AMYLASE in the last 168 hours. No results for input(s): AMMONIA in the last 168 hours. Coagulation Profile: Recent Labs  Lab 05/18/17 0514  INR 1.11   Cardiac Enzymes: No results for input(s): CKTOTAL, CKMB, CKMBINDEX, TROPONINI in the last 168 hours. BNP (last 3 results) No results for input(s): PROBNP in the last 8760 hours. CBG: No results for input(s): GLUCAP in the last 168 hours. Studies: No results found.  Scheduled Meds: . DULoxetine  30 mg Oral Daily  . [START ON 05/19/2017] heparin  5,000 Units Subcutaneous Q8H  . nicotine  21 mg Transdermal Daily  . pantoprazole  40 mg Oral Daily  . QUEtiapine  100 mg Oral BID  . senna  1 tablet Oral BID   Continuous Infusions: . sodium chloride 75 mL/hr at 05/18/17 0502  . cefTRIAXone (ROCEPHIN)  IV Stopped (05/18/17 1234)  . methocarbamol (ROBAXIN)  IV Stopped (05/18/17 1325)   PRN Meds: diphenhydrAMINE, HYDROmorphone (DILAUDID) injection, LORazepam,  ondansetron **OR** ondansetron (ZOFRAN) IV, oxyCODONE, polyethylene glycol  Time spent: 35 minutes  Author: Lynden Oxford, MD [facility] Pager: [phone] 05/18/2017 6:20 PM  If 7PM-7AM, please contact night-coverage at [email or web address], password [facility]

## 2017-05-18 NOTE — Consult Note (Signed)
[facility] for Infectious Disease    Date of Admission:  05/17/2017     Total days of antibiotics                Reason for Consult: Vertebral osteomyelitis   Referring Provider: Posey Pronto Primary Care Provider: Patient, No Pcp Per   Assessment/Plan:  Mr. Willert is a 42 y/o male with history of Hepatitis C, Bipolar and IVDU found to have osteomyelitis/discitis of L2-L3. There is conflicting information about potential trauma, but this is likely related to his IVDU. He will require prolonged IV antibiotic therapy with disposition to be determined. Blood cultures with gram stains showing gram positive cocci and cultures now with gram positive cocci as well. He is awaiting aspiration from his lumbar spine by IR and not currently on antibiotics. He appears amendable to starting medication for opioid use disorder. Finances remain a large concern for him.   1. Continue to monitor cultures and await aspiration by IR. 2. Hepatitis C lab work pending. 3. Will obtain CRP and ESR with morning labs.    Principal Problem:   Acute osteomyelitis of lumbar spine (HCC) Active Problems:   Hepatitis C   Opioid use disorder, moderate, dependence (HCC)   GERD (gastroesophageal reflux disease)   Bipolar disorder ([place])   . DULoxetine  30 mg Oral Daily  . [START ON 05/19/2017] heparin  5,000 Units Subcutaneous Q8H  . nicotine  21 mg Transdermal Daily  . pantoprazole  40 mg Oral Daily  . QUEtiapine  100 mg Oral BID  . senna  1 tablet Oral BID     HPI: CELESTINE BOUGIE is a 42 y.o. male with a previous medical history of Bipolar, chronic back pain, degenerative disc disease, Hepatitis C and substance abuse who presented to the [facility] ED on 5/7 with the chief complaint of low back pain starting about 3 days prior to presentation following jumping and landing on his feet. Severity of pain is worse than his previous chronic back pain.   Previous imaging on 5/5 from a prior ED visit showed a  remote superior endplate fracture at L1 which was not new with endplate sclerotic changes at L2-L3. MRI on 5/6 with concern for L2-L3 discitis and osteomyelitis. Per exam he had good strength and sensation with equal pulses. Neurosurgy recommended IR consult for biopsy along with ID consult. Blood cultures were drawn with gram stain showing gram positive cocci. Urine drug screen was positive for cocaine. Hepatitis C RNA levels are pending.   Afebrile overnight with continued severe pain located in his lumbar spine and weakness that is exacerbated with movement. Some numbness and tingling of the lower extremities. Has used IV drugs in the past and states that he uses both uppers and downers. States his last time injecting was about 1 week ago. Primarily injects in his right forearm/arm. Does typical reuse needles. Has been on methadone in the past.    Review of Systems: Review of Systems  Constitutional: Negative for chills and fever.  Respiratory: Negative for cough, sputum production, shortness of breath and wheezing.   Cardiovascular: Negative for chest pain and leg swelling.  Gastrointestinal: Negative for abdominal pain, constipation, diarrhea, nausea and vomiting.  Musculoskeletal: Positive for back pain.  Skin: Negative for rash.  Neurological: Positive for tingling and weakness.     Past Medical History:  Diagnosis Date  . ADHD (attention deficit hyperactivity disorder)   . Bipolar 1 disorder ([place])   . Chronic back  pain   . Chronic pain    chronic l leg pain  . DDD (degenerative disc disease), lumbar   . Degenerative disc disease, lumbar   . Depression   . GERD (gastroesophageal reflux disease)   . Hepatitis C   . Hepatitis C   . History of stomach ulcers   . Sciatica   . Substance abuse ([place])     Social History   Tobacco Use  . Smoking status: Current Every Day Smoker    Packs/day: 1.00    Years: 24.00    Pack years: 24.00    Types: Cigarettes  . Smokeless tobacco:  Former Systems developer    Quit date: 08/08/2006  Substance Use Topics  . Alcohol use: Yes    Comment: liquor  . Drug use: Not Currently    Types: Cocaine    Comment: last used few mos    Family History  Problem Relation Age of Onset  . Diabetes Mother     Allergies  Allergen Reactions  . Acetaminophen Other (See Comments)    Due to Ulcers/ Liver Damage  . Morphine And Related Itching    OBJECTIVE: Blood pressure (!) 163/104, pulse 93, temperature 99.6 F (37.6 C), resp. rate 18, height 5' 3"  (1.6 m), weight 155 lb 6.8 oz (70.5 kg), SpO2 100 %.  Physical Exam  Constitutional: He is oriented to person, place, and time. He appears well-developed and well-nourished. No distress.  Cardiovascular: Normal rate, regular rhythm, normal heart sounds and intact distal pulses. Exam reveals no gallop and no friction rub.  No murmur heard. Pulmonary/Chest: Effort normal and breath sounds normal. No stridor. No respiratory distress. He has no wheezes. He has no rales. He exhibits no tenderness.  Abdominal: Soft. Bowel sounds are normal.  Neurological: He is alert and oriented to person, place, and time.  Skin: Skin is warm and dry.  Psychiatric: He has a normal mood and affect.    Lab Results Lab Results  Component Value Date   WBC 6.5 05/18/2017   HGB 13.8 05/18/2017   HCT 41.7 05/18/2017   MCV 96.1 05/18/2017   PLT 152 05/18/2017    Lab Results  Component Value Date   CREATININE 0.81 05/18/2017   BUN 8 05/18/2017   NA 132 (L) 05/18/2017   K 3.5 05/18/2017   CL 99 (L) 05/18/2017   CO2 27 05/18/2017    Lab Results  Component Value Date   ALT 38 05/18/2017   AST 30 05/18/2017   ALKPHOS 37 (L) 05/18/2017   BILITOT 0.9 05/18/2017     Microbiology: Recent Results (from the past 240 hour(s))  Blood culture (routine x 2)     Status: None (Preliminary result)   Collection Time: 05/17/17  8:30 AM  Result Value Ref Range Status   Specimen Description   Final    BLOOD RIGHT  HAND Performed at [facility], [address] [place], [place] [address]    Special Requests   Final    BOTTLES DRAWN AEROBIC AND ANAEROBIC Blood Culture adequate volume Performed at [facility], [address], [place], [place] [address]    Culture  Setup Time   Final    GRAM POSITIVE COCCI Gram Stain Report Called to,Read Back By and Verified With: NURSE ODUNTAN NOTIFIED AT 0328 ON 05/18/2017 BY EVA ANAEROBIC BOTTLE ONLY Performed at [place]  Final   Report Status PENDING  Incomplete  Blood culture (routine x 2)  Status: None (Preliminary result)   Collection Time: 05/17/17  8:42 AM  Result Value Ref Range Status   Specimen Description   Final    BLOOD LEFT ARM DRAWN BY RN Performed at [facility], [address] [place], [place] [address]    Special Requests   Final    BOTTLES DRAWN AEROBIC AND ANAEROBIC Blood Culture adequate volume Performed at [facility], [address] [place], [place] [address]    Culture  Setup Time   Final    GRAM POSITIVE COCCI Gram Stain Report Called to,Read Back By and Verified With: NURSE ODUNTAN AT [place] ON 05/18/2017 BY EVA ANAEROBIC BOTTLE ONLY Performed at [facility] Performed at [facility], [address], [place], [place] [address]    Culture [place]  Final   Report Status PENDING  Incomplete     Terri Piedra, NP [facility] [phone] Pager  05/18/2017  8:11 AM

## 2017-05-18 NOTE — Progress Notes (Signed)
Patient arrived to the unit via EMS at about 2130. Patient is alert but agitated. Screaming, crying and did not allow staff to touch him. Staff waited for pt to calm down before going closer to him.  Prn medications were given for pain through the night.  Pt was oriented to the room and made comfortable. Will continue to monitor.

## 2017-05-19 ENCOUNTER — Inpatient Hospital Stay (HOSPITAL_COMMUNITY): Payer: Self-pay

## 2017-05-19 DIAGNOSIS — R7881 Bacteremia: Secondary | ICD-10-CM

## 2017-05-19 LAB — COMPREHENSIVE METABOLIC PANEL
ALBUMIN: 2.5 g/dL — AB (ref 3.5–5.0)
ALT: 39 U/L (ref 17–63)
AST: 28 U/L (ref 15–41)
Alkaline Phosphatase: 32 U/L — ABNORMAL LOW (ref 38–126)
Anion gap: 8 (ref 5–15)
BUN: 9 mg/dL (ref 6–20)
CHLORIDE: 99 mmol/L — AB (ref 101–111)
CO2: 24 mmol/L (ref 22–32)
Calcium: 8.3 mg/dL — ABNORMAL LOW (ref 8.9–10.3)
Creatinine, Ser: 0.73 mg/dL (ref 0.61–1.24)
GFR calc Af Amer: >60 mL/min (ref >60)
GFR calc non Af Amer: >60 mL/min (ref >60)
GLUCOSE: 135 mg/dL — AB (ref 65–99)
POTASSIUM: 3.7 mmol/L (ref 3.5–5.1)
SODIUM: 131 mmol/L — AB (ref 135–145)
Total Bilirubin: 1 mg/dL (ref 0.3–1.2)
Total Protein: 6.3 g/dL — ABNORMAL LOW (ref 6.5–8.1)

## 2017-05-19 LAB — CBC WITH DIFFERENTIAL/PLATELET
BASOS ABS: 0 10*3/uL (ref 0.0–0.1)
BASOS PCT: 0 %
EOS ABS: 0 10*3/uL (ref 0.0–0.7)
EOS PCT: 0 %
HCT: 37.9 % — ABNORMAL LOW (ref 39.0–52.0)
Hemoglobin: 12.5 g/dL — ABNORMAL LOW (ref 13.0–17.0)
Lymphocytes Relative: 18 %
Lymphs Abs: 1.1 10*3/uL (ref 0.7–4.0)
MCH: 31.1 pg (ref 26.0–34.0)
MCHC: 33 g/dL (ref 30.0–36.0)
MCV: 94.3 fL (ref 78.0–100.0)
MONO ABS: 0.6 10*3/uL (ref 0.1–1.0)
Monocytes Relative: 10 %
Neutro Abs: 4.4 10*3/uL (ref 1.7–7.7)
Neutrophils Relative %: 72 %
PLATELETS: 145 10*3/uL — AB (ref 150–400)
RBC: 4.02 MIL/uL — AB (ref 4.22–5.81)
RDW: 13.4 % (ref 11.5–15.5)
WBC: 6.1 10*3/uL (ref 4.0–10.5)

## 2017-05-19 LAB — HCV RNA QUANT
HCV Quantitative Log: 5.794 log10 IU/mL (ref >1.70)
HCV Quantitative: 622000 IU/mL (ref >50)

## 2017-05-19 LAB — SEDIMENTATION RATE: SED RATE: 72 mm/h — AB (ref 0–16)

## 2017-05-19 LAB — C-REACTIVE PROTEIN: CRP: 7.8 mg/dL — AB (ref <1.0)

## 2017-05-19 LAB — MAGNESIUM: Magnesium: 1.8 mg/dL (ref 1.7–2.4)

## 2017-05-19 MED ORDER — LORAZEPAM 2 MG/ML IJ SOLN
0.5000 mg | INTRAMUSCULAR | Status: DC | PRN
  Administered 2017-05-19 – 2017-05-24 (×9): 0.5 mg via INTRAVENOUS
  Filled 2017-05-19 (×10): qty 1

## 2017-05-19 MED ORDER — KETOROLAC TROMETHAMINE 30 MG/ML IJ SOLN
30.0000 mg | Freq: Four times a day (QID) | INTRAMUSCULAR | Status: AC
  Administered 2017-05-19 – 2017-05-24 (×20): 30 mg via INTRAVENOUS
  Filled 2017-05-19 (×20): qty 1

## 2017-05-19 MED ORDER — LIDOCAINE 5 % EX PTCH
1.0000 | MEDICATED_PATCH | CUTANEOUS | Status: DC
  Administered 2017-05-19 – 2017-05-20 (×2): 1 via TRANSDERMAL
  Filled 2017-05-19 (×3): qty 1

## 2017-05-19 MED ORDER — LORAZEPAM 0.5 MG PO TABS: 0.5000 mg | ORAL_TABLET | ORAL | Status: DC | PRN

## 2017-05-19 MED ORDER — FENTANYL CITRATE (PF) 100 MCG/2ML IJ SOLN
12.5000 ug | INTRAMUSCULAR | Status: DC | PRN
  Administered 2017-05-19 – 2017-05-22 (×16): 12.5 ug via INTRAVENOUS
  Filled 2017-05-19 (×17): qty 2

## 2017-05-19 NOTE — Progress Notes (Signed)
[facility] Progress Note  Patient: Joshua Thomas RUE:[id number]   PCP: Patient, No Pcp Per DOB: [birth year]   DOA: 05/17/2017   DOS: 05/19/2017   Date of Service: the patient was seen and examined on 05/19/2017  Subjective: Refused ultrasound, refused to work with PT.  Was incoherent this morning agitated last night.  Brief hospital course: Pt. with PMH of bipolar disorder, chronic back pain, hepatitis C, GERD, active smoker, polysubstance abuse; admitted on 05/17/2017, presented with complaint of back pain, was found to have discitis. Currently further plan is IV antibiotics.  Assessment and Plan: 1.  Acute discitis.  With osteomyelitis of L2-3 Streptococcus bacteremia. ID consulted. Blood cultures positive. Sensitivities pending. MRI positive for acute discitis as well as osteomyelitis. Patient was initially seen in the [facility], transferred here for IR work-up. IR consulted and felt that the patient is at high risk for regular IR guided biopsy and recommended anesthesia. Infectious disease recommends that the patient does not need any biopsy since his blood cultures are positive for Streptococcus already. Echocardiogram ordered for bacteremia.  Although patient refused.  I do not think the patient requires echocardiogram but the cultures are clear. HIV negative, work-up for  hep C pending.  2.  Pain control. Chronic back pain. Patient is requesting multiple rounds of IV narcotic pain medication. He mentions that 1 mg of Dilaudid did not help his pain although 12.5 mcg of fentanyl resolve the pain completely. I would not label this patient as opioid tolerant given that he does not use opiates on a regular basis currently. Based on this I would avoid escalating narcotic regimen any further. Recommendation is to start the patient on Suboxone, I have placed a call to outpatient Suboxone management team to see whether the patient qualifies.  Awaiting callback. Placing the  patient on scheduled Robaxin  3.  Polysubstance abuse. Possible withdrawal. PRN Ativan.  4.  Active smoker. Tobacco cessation discussed.  5.  Bipolar disorder. Continue Seroquel and Cymbalta although the patient refused at this morning.  Diet: regular diet DVT Prophylaxis: subcutaneous Heparin  Advance goals of care discussion: full code  Family Communication: family was present at bedside, at the time of interview. The pt provided permission to discuss medical plan with the family. Opportunity was given to ask question and all questions were answered satisfactorily.   Disposition:  Discharge to home. Ideally patient will require 6 weeks of IV antibiotics in a facility. Should the patient leave AMA patient will require 2 months of oral amoxicillin, and prior to discharge patient should receive 1 dose of IV oritavancin   Consultants: ID, IR, anesthesiology Procedures: none  Antibiotics: Anti-infectives (From admission, onward)   Start     Dose/Rate Route Frequency Ordered Stop   05/18/17 1100  cefTRIAXone (ROCEPHIN) 2 g in sodium chloride 0.9 % 100 mL IVPB     2 g 200 mL/hr over 30 Minutes Intravenous Every 24 hours 05/18/17 1006         Objective: Physical Exam: Vitals:   05/17/17 2000 05/17/17 2157 05/18/17 0524 05/18/17 2355  BP:  (!) 157/89 (!) 163/104 (!) 161/101  Pulse: (!) 119 (!) 120 93 (!) 104  Resp:  Temp:  99 F (37.2 C) 99.6 F (37.6 C) 99.4 F (37.4 C)  TempSrc:  Oral    SpO2: 100% 100% 100% 96%  Weight:   70.5 kg (155 lb 6.8 oz)   Height:    (1.6 m)  Intake/Output Summary (Last 24 hours) at 05/19/2017 1746 Last data filed at 05/19/2017 1236 Gross per 24 hour  Intake 1005 ml  Output 1350 ml  Net -345 ml   Filed Weights   05/18/17 0524  Weight: 70.5 kg (155 lb 6.8 oz)   General: Alert, Awake and Oriented to Time, Place and Person. Appear in marked distress, affect irritable Eyes: PERRL, Conjunctiva normal ENT: Oral Mucosa  clear moist. Neck: no JVD, no Abnormal Mass Or lumps Cardiovascular: S1 and S2 Present, no Murmur, Peripheral Pulses Present Respiratory: normal respiratory effort, Bilateral Air entry equal and Decreased, no use of accessory muscle, Clear to Auscultation, no Crackles, no wheezes Abdomen: Bowel Sound present, Soft and no tenderness, n hernia Skin: ono redness, no Rash, no induration Extremities: no Pedal edema, no calf tenderness Neurologic: Grossly no focal neuro deficit. Bilaterally Equal motor strength  Data Reviewed: CBC: Recent Labs  Lab 05/17/17 0825 05/18/17 0514 05/19/17 0954  WBC 6.2 6.5 6.1  NEUTROABS 3.8  --  4.4  HGB 13.0 13.8 12.5*  HCT 40.7 41.7 37.9*  MCV 96.2 96.1 94.3  PLT 133* 152 145*   Basic Metabolic Panel: Recent Labs  Lab 05/17/17 0825 05/18/17 0514 05/19/17 0954  NA 135 132* 131*  K 3.8 3.5 3.7  CL 101 99* 99*  CO2 GLUCOSE 107* 138* 135*  BUN CREATININE 0.82 0.81 0.73  CALCIUM 9.0 8.6* 8.3*  MG  --   --  1.8    Liver Function Tests: Recent Labs  Lab 05/17/17 0825 05/18/17 0514 05/19/17 0954  AST ALT 37 38 39  ALKPHOS 35* 37* 32*  BILITOT 0.9 0.9 1.0  PROT 7.1 6.7 6.3*  ALBUMIN 3.2* 2.8* 2.5*   No results for input(s): LIPASE, AMYLASE in the last 168 hours. No results for input(s): AMMONIA in the last 168 hours. Coagulation Profile: Recent Labs  Lab 05/18/17 0514  INR 1.11   Cardiac Enzymes: No results for input(s): CKTOTAL, CKMB, CKMBINDEX, TROPONINI in the last 168 hours. BNP (last 3 results) No results for input(s): PROBNP in the last 8760 hours. CBG: No results for input(s): GLUCAP in the last 168 hours. Studies: No results found.  Scheduled Meds: . Chlorhexidine Gluconate Cloth  6 each Topical Q0600  . DULoxetine  30 mg Oral Daily  . heparin  5,000 Units Subcutaneous Q8H  . ketorolac  30 mg Intravenous Q6H  . lidocaine  1 patch Transdermal Q24H  . mupirocin ointment  1 application Nasal  BID  . nicotine  21 mg Transdermal Daily  . pantoprazole  40 mg Oral Daily  . QUEtiapine  100 mg Oral BID  . senna  1 tablet Oral BID   Continuous Infusions: . sodium chloride 75 mL/hr at 05/19/17 1544  . cefTRIAXone (ROCEPHIN)  IV Stopped (05/19/17 1034)  . methocarbamol (ROBAXIN)  IV Stopped (05/19/17 1544)   PRN Meds: diphenhydrAMINE, fentaNYL (SUBLIMAZE) injection, LORazepam, ondansetron **OR** ondansetron (ZOFRAN) IV, oxyCODONE, polyethylene glycol  Time spent: 35 minutes  Author: Lynden Oxford, MD [facility] Pager: [phone] 05/19/2017 5:46 PM  If 7PM-7AM, please contact night-coverage at [email or web address], password [facility]

## 2017-05-19 NOTE — Progress Notes (Signed)
Pt's mother called to nurse's station for pain medication. Took Fentanyl into pt's room he is moaning in pain. Pt continually saying "G*d D*mn". Gave the pt the Fentanyl and then pt stated he needed something to drink and help with his pillow. His mother at bedside helping patient with pillow. Nurse stands near sink in room to make sure pt's request are met and if pt's mother needs assistance.  Pt says "Fucking stupidity, why are you still standing there?" Nurse told pt that I am here to meet his needs and was waiting to see if he needed anything else or if his mother needed my help with his request. He said that was all he needed and waved goodbye.  Nurse told him that I would like respect and for him not to use "G*d D*mn" around me. Took mother a cup of ice for pt. Told mom to call nurse if they needed assistance. Pt stated he wanted to be left alone till he called on the call bell. Will continue to monitor pt.  [place], RN 

## 2017-05-19 NOTE — Progress Notes (Signed)
Patient refused exam and was extremely hostile towards myself & one of the nurses in the room.  Unable to perform exam as a result.

## 2017-05-19 NOTE — Evaluation (Signed)
Physical Therapy Evaluation Patient Details Name: Joshua Thomas MRN: [id number] DOB: 06/23/1975 Today's Date: 05/19/2017   History of Present Illness  Pt. with PMH of bipolar disorder, chronic back pain, hepatitis C, GERD, active smoker, polysubstance abuse; admitted on 05/17/2017, presented with complaint of back pain, was found to have discitis/osteomyelitits  Clinical Impression  Pt admitted with above diagnosis. Pt currently with functional limitations due to the deficits listed below (see PT Problem List).  PT eval limited d/t pt incr pain, easily agitated; able to complete limited PT eval, pt performed UE/LE movement and rolling; will continue to follow however feel when pt pain is more controlled he will be able to mobilize without difficulty, he reports independence at baseline;   Pt will benefit from skilled PT to increase their independence and safety with mobility to allow discharge to the venue listed below.       Follow Up Recommendations No PT follow up    Equipment Recommendations  Other (comment)(TBD)    Recommendations for Other Services       Precautions / Restrictions Precautions Precautions: Back Restrictions Weight Bearing Restrictions: No      Mobility  Bed Mobility Overal bed mobility: Needs Assistance Bed Mobility: Rolling Rolling: Min guard         General bed mobility comments: min/guard to roll wtih use of rail; pt declines OOB or EOB d/t pain  Transfers                 General transfer comment: NT/refused  Ambulation/Gait                Stairs            Wheelchair Mobility    Modified Rankin (Stroke Patients Only)       Balance                                             Pertinent Vitals/Pain Pain Assessment: Faces Faces Pain Scale: Hurts worst Pain Location: back Pain Descriptors / Indicators: Constant Pain Intervention(s): Monitored during session;Repositioned    Home Living                    Additional Comments: pt is agitated by questioning, unable to determine living situtation    Prior Function Level of Independence: Independent               Hand Dominance        Extremity/Trunk Assessment   Upper Extremity Assessment Upper Extremity Assessment: Overall WFL for tasks assessed    Lower Extremity Assessment Lower Extremity Assessment: RLE deficits/detail;LLE deficits/detail RLE Deficits / Details: AROM grossly WFL, unabel to perform strength testing d/t pain, muscle tone appears normal LLE Deficits / Details: AROM grossly WFL, unabel to perform strength testing d/t pain, muscle tone appears normal       Communication   Communication: No difficulties  Cognition Arousal/Alertness: Awake/alert Behavior During Therapy: Agitated Overall Cognitive Status: Within Functional Limits for tasks assessed                                 General Comments: pt is easily agitated but able to calm pt enough to perform limited eval       General Comments      Exercises  Assessment/Plan    PT Assessment Patient needs continued PT services  PT Problem List Decreased activity tolerance;Decreased mobility;Pain       PT Treatment Interventions DME instruction;Gait training;Functional mobility training;Therapeutic activities;Patient/family education    PT Goals (Current goals can be found in the Care Plan section)  Acute Rehab PT Goals Patient Stated Goal: less pain PT Goal Formulation: With patient Time For Goal Achievement: 06/02/17 Potential to Achieve Goals: Good    Frequency Min 2X/week   Barriers to discharge        Co-evaluation               AM-PAC PT "6 Clicks" Daily Activity  Outcome Measure Difficulty turning over in bed (including adjusting bedclothes, sheets and blankets)?: A Lot Difficulty moving from lying on back to sitting on the side of the bed? : Unable Difficulty sitting down on and standing up  from a chair with arms (e.g., wheelchair, bedside commode, etc,.)?: Unable Help needed moving to and from a bed to chair (including a wheelchair)?: Total Help needed walking in hospital room?: Total Help needed climbing 3-5 steps with a railing? : Total 6 Click Score: 7    End of Session   Activity Tolerance: Patient limited by pain Patient left: in bed;with call bell/phone within reach;with bed alarm set   PT Visit Diagnosis: Difficulty in walking, not elsewhere classified (R26.2);Pain Pain - part of body: (back)    Time: 8295-6213 PT Time Calculation (min) (ACUTE ONLY): 13 min   Charges:   PT Evaluation $PT Eval Low Complexity: 1 Low     PT G Codes:          Simonne Boulos 06/18/17, 4:17 PM

## 2017-05-19 NOTE — Progress Notes (Signed)
[facility]  Date of Admission:  05/17/2017              ASSESSMENT/PLAN  [name] is on Day 2 of Ceftriaxone for Osteomyelitis/discitis and Streptococcus species bacteremia. Appears to be tolerating antibiotics with no adverse side effects or rashes. He refused his TTE today. Decided based on need for general anesthesia to cancel aspiration as blood cultures likely reflect appropriate organism. HIV is negative. Hepatitis C RNA levels are pending. Blood work ordered and awaiting collection. Continues to have significant pain.   1. Continue ceftriaxone. Will require prolonged therapy of at least 6 weeks for osteomyelitis and bacteremia.  2. Monitor repeat blood cultures for clearance once drawn.  3. Monitor Hepatitis C blood work and will obtain Genotype pending RNA levels if present. Plan to treat outpatient.  4. Pain per primary team. Spoke with Dr. Allena Katz and will discuss with IM team for possible suboxone.   We will continue to follow.   Principal Problem:   Acute osteomyelitis of lumbar spine (HCC) Active Problems:   Hepatitis C   Opioid use disorder, moderate, dependence (HCC)   GERD (gastroesophageal reflux disease)   Bipolar disorder (HCC)   Discitis   Streptococcal bacteremia   . Chlorhexidine Gluconate Cloth  6 each Topical Q0600  . DULoxetine  30 mg Oral Daily  . heparin  5,000 Units Subcutaneous Q8H  . mupirocin ointment  1 application Nasal BID  . nicotine  21 mg Transdermal Daily  . pantoprazole  40 mg Oral Daily  . QUEtiapine  100 mg Oral BID  . senna  1 tablet Oral BID    SUBJECTIVE:  Afebrile overnight. Lab work yet to be collected today including repeat blood cultures.  Continues to have low back pain described as severe. Declined TTE this morning.  Allergies  Allergen Reactions  . Acetaminophen Other (See Comments)    Due to Ulcers/ Liver Damage  . Morphine And Related Itching     Review of Systems: Review of  Systems  Constitutional: Negative for chills and fever.  Respiratory: Negative for cough, shortness of breath and wheezing.   Cardiovascular: Negative for chest pain and leg swelling.  Gastrointestinal: Negative for abdominal pain, diarrhea, nausea and vomiting.  Musculoskeletal: Positive for back pain.  Skin: Negative for rash.      OBJECTIVE: Vitals:   05/17/17 2000 05/17/17 2157 05/18/17 0524 05/18/17 2355  BP:  (!) 157/89 (!) 163/104 (!) 161/101  Pulse: (!) 119 (!) 120 93 (!) 104  Resp:  Temp:  99 F (37.2 C) 99.6 F (37.6 C) 99.4 F (37.4 C)  TempSrc:  Oral    SpO2: 100% 100% 100% 96%  Weight:   155 lb 6.8 oz (70.5 kg)   Height:    (1.6 m)    Body mass index is 27.53 kg/m.  Physical Exam  Constitutional: He appears well-developed and well-nourished. He is easily aroused. No distress.  Lying in bed.   Cardiovascular: Normal rate, regular rhythm, normal heart sounds and intact distal pulses. Exam reveals no gallop and no friction rub.  No murmur heard. Pulmonary/Chest: Effort normal and breath sounds normal. No stridor. No respiratory distress. He has no wheezes. He has no rales. He exhibits no tenderness.  Abdominal: Soft. Bowel sounds are normal. He exhibits no distension. There is no tenderness.  Neurological: He is easily aroused.  Skin: Skin is warm and dry.  Psychiatric: He has a normal mood and  affect.    Lab Results Lab Results  Component Value Date   WBC 6.5 05/18/2017   HGB 13.8 05/18/2017   HCT 41.7 05/18/2017   MCV 96.1 05/18/2017   PLT 152 05/18/2017    Lab Results  Component Value Date   CREATININE 0.81 05/18/2017   BUN 8 05/18/2017   NA 132 (L) 05/18/2017   K 3.5 05/18/2017   CL 99 (L) 05/18/2017   CO2 27 05/18/2017    Lab Results  Component Value Date   ALT 38 05/18/2017   AST 30 05/18/2017   ALKPHOS 37 (L) 05/18/2017   BILITOT 0.9 05/18/2017     Microbiology: Recent Results (from the past 240 hour(s))  Blood  culture (routine x 2)     Status: None (Preliminary result)   Collection Time: 05/17/17  8:30 AM  Result Value Ref Range Status   Specimen Description   Final    BLOOD RIGHT HAND Performed at [facility], [address] [place], [place] [address]    Special Requests   Final    BOTTLES DRAWN AEROBIC AND ANAEROBIC Blood Culture adequate volume Performed at [facility], [address], [place], [place] [address]    Culture  Setup Time   Final    GRAM POSITIVE COCCI Gram Stain Report Called to,Read Back By and Verified With: NURSE ODUNTAN NOTIFIED AT 0328 ON 05/18/2017 BY EVA IN BOTH AEROBIC AND ANAEROBIC BOTTLES Performed at [facility] AEROBIC AND ANAEROBIC BOTTLES Performed at [facility], [address], [place], [place] [address]    Culture GRAM POSITIVE COCCI  Final   Report Status PENDING  Incomplete  Blood Culture ID Panel (Reflexed)     Status: Abnormal   Collection Time: 05/17/17  8:30 AM  Result Value Ref Range Status   Enterococcus species NOT DETECTED NOT DETECTED Final   Listeria monocytogenes NOT DETECTED NOT DETECTED Final   Staphylococcus species NOT DETECTED NOT DETECTED Final   Staphylococcus aureus NOT DETECTED NOT DETECTED Final   Streptococcus species DETECTED (A) NOT DETECTED Final    Comment: Not Enterococcus species, Streptococcus agalactiae, Streptococcus pyogenes, or Streptococcus pneumoniae. CRITICAL RESULT CALLED TO, READ BACK BY AND VERIFIED WITH: Patrick North PharmD 10:00 05/18/17 ([email or web address])    Streptococcus agalactiae NOT DETECTED NOT DETECTED Final   Streptococcus pneumoniae NOT DETECTED NOT DETECTED Final   Streptococcus pyogenes NOT DETECTED NOT DETECTED Final   Acinetobacter baumannii NOT DETECTED NOT DETECTED Final   Enterobacteriaceae species NOT DETECTED NOT DETECTED Final   Enterobacter cloacae complex NOT DETECTED NOT DETECTED Final   Escherichia coli NOT DETECTED NOT DETECTED Final   Klebsiella oxytoca NOT DETECTED NOT DETECTED  Final   Klebsiella pneumoniae NOT DETECTED NOT DETECTED Final   Proteus species NOT DETECTED NOT DETECTED Final   Serratia marcescens NOT DETECTED NOT DETECTED Final   Haemophilus influenzae NOT DETECTED NOT DETECTED Final   Neisseria meningitidis NOT DETECTED NOT DETECTED Final   Pseudomonas aeruginosa NOT DETECTED NOT DETECTED Final   Candida albicans NOT DETECTED NOT DETECTED Final   Candida glabrata NOT DETECTED NOT DETECTED Final   Candida krusei NOT DETECTED NOT DETECTED Final   Candida parapsilosis NOT DETECTED NOT DETECTED Final   Candida tropicalis NOT DETECTED NOT DETECTED Final  Blood culture (routine x 2)     Status: None (Preliminary result)   Collection Time: 05/17/17  8:42 AM  Result Value Ref Range Status   Specimen Description   Final    BLOOD LEFT ARM DRAWN  BY RN Performed at [facility], [address], [place], [place] [address]    Special Requests   Final    BOTTLES DRAWN AEROBIC AND ANAEROBIC Blood Culture adequate volume Performed at [facility], [address], [place], [place] [address]    Culture  Setup Time   Final    GRAM POSITIVE COCCI Gram Stain Report Called to,Read Back By and Verified With: NURSE ODUNTAN AT 0441 ON 05/18/2017 BY EVA ANAEROBIC BOTTLE ONLY Performed at [facility] IN BOTH AEROBIC AND ANAEROBIC BOTTLES Performed at [facility], [address] [place], [place] [address]    Culture GRAM POSITIVE COCCI  Final   Report Status PENDING  Incomplete  MRSA PCR Screening     Status: Abnormal   Collection Time: 05/18/17  6:37 AM  Result Value Ref Range Status   MRSA by PCR POSITIVE (A) NEGATIVE Final    Comment:        The GeneXpert MRSA Assay (FDA approved for NASAL specimens only), is one component of a comprehensive MRSA colonization surveillance program. It is not intended to diagnose MRSA infection nor to guide or monitor treatment for MRSA infections. RESULT CALLED TO, READ BACK BY AND VERIFIED WITH: RN Milbert Coulter  [id number] [phone] MLM Performed at [facility], [address], [place], [place] [address]      Marcos Eke, NP [facility] [phone] Pager  05/19/2017  9:36 AM

## 2017-05-20 ENCOUNTER — Inpatient Hospital Stay (HOSPITAL_COMMUNITY): Payer: Self-pay

## 2017-05-20 DIAGNOSIS — R7881 Bacteremia: Secondary | ICD-10-CM

## 2017-05-20 LAB — ECHOCARDIOGRAM COMPLETE
HEIGHTINCHES: 63 in
Weight: 2486.79 oz

## 2017-05-20 LAB — CULTURE, BLOOD (ROUTINE X 2)
Special Requests: ADEQUATE
Special Requests: ADEQUATE

## 2017-05-20 MED ORDER — OXYCODONE-ACETAMINOPHEN 5-325 MG PO TABS
1.0000 | ORAL_TABLET | Freq: Four times a day (QID) | ORAL | Status: DC | PRN
  Administered 2017-05-20 (×2): 2 via ORAL
  Administered 2017-05-20: 1 via ORAL
  Administered 2017-05-21 – 2017-05-26 (×14): 2 via ORAL
  Filled 2017-05-20 (×13): qty 2
  Filled 2017-05-20: qty 1
  Filled 2017-05-20 (×4): qty 2

## 2017-05-20 MED ORDER — METHOCARBAMOL 500 MG PO TABS
500.0000 mg | ORAL_TABLET | Freq: Three times a day (TID) | ORAL | Status: DC
  Administered 2017-05-20 – 2017-05-26 (×20): 500 mg via ORAL
  Filled 2017-05-20 (×21): qty 1

## 2017-05-20 MED ORDER — SENNOSIDES-DOCUSATE SODIUM 8.6-50 MG PO TABS
1.0000 | ORAL_TABLET | Freq: Two times a day (BID) | ORAL | Status: DC
  Administered 2017-05-20 – 2017-05-26 (×13): 1 via ORAL
  Filled 2017-05-20 (×13): qty 1

## 2017-05-20 NOTE — Progress Notes (Signed)
[facility]  Date of Admission:  05/17/2017     Total days of antibiotics 3/42         ASSESSMENT/PLAN  Joshua Thomas is on Day 3 of ceftriaxone for Viridans Strep bacteremia and osteomyelitis. Afebrile with no leukocytosis. Tolerating the antibiotics without adverse side effects. Less than optimal patient cooperation and has refused his 2D echo but now willing to try again. Continues to have back pain. Hepatitis C blood work showing viral load of 622,000. Will order genotype with next blood work. Goal for outpatient treatment if willing to cooperate.  1. Continue Ceftriaxone. 2. Monitor repeat cultures for clearance of bacteremia. 3. TTE when able to rule out endocarditis.  4. Hepatitis C genotype with next blood work. 5. Pain management per primary.     Principal Problem:   Acute osteomyelitis of lumbar spine (HCC) Active Problems:   Hepatitis C   Opioid use disorder, moderate, dependence (HCC)   GERD (gastroesophageal reflux disease)   Bipolar disorder (HCC)   Discitis   Streptococcal bacteremia   . Chlorhexidine Gluconate Cloth  6 each Topical Q0600  . DULoxetine  30 mg Oral Daily  . heparin  5,000 Units Subcutaneous Q8H  . ketorolac  30 mg Intravenous Q6H  . lidocaine  1 patch Transdermal Q24H  . methocarbamol  500 mg Oral TID  . mupirocin ointment  1 application Nasal BID  . nicotine  21 mg Transdermal Daily  . pantoprazole  40 mg Oral Daily  . QUEtiapine  100 mg Oral BID  . senna-docusate  1 tablet Oral BID    SUBJECTIVE:  Afebrile over night with no leukocytosis. Initial blood cultures with Viridans Strep.  Sensitive to ceftriaxone, penicillin and vancomycin. Repeat blood cultures with no growth in less than 24 hours. Back is feeling better today and able to sit up.   Allergies  Allergen Reactions  . Acetaminophen Other (See Comments)    Due to Ulcers/ Liver Damage  . Morphine And Related Itching     Review of  Systems: Review of Systems  Constitutional: Negative for chills and fever.  Respiratory: Negative for cough, sputum production, shortness of breath and wheezing.   Cardiovascular: Negative for chest pain.  Gastrointestinal: Negative for abdominal pain, constipation, diarrhea, nausea and vomiting.  Musculoskeletal: Positive for back pain.  Neurological: Negative for focal weakness.      OBJECTIVE: Vitals:   05/18/17 0524 05/18/17 2355 05/19/17 2127 05/20/17 0512  BP: (!) 163/104 (!) 161/101 (!) 145/96 123/79  Pulse: 93 (!) 104 97 92  Resp: Temp: 99.6 F (37.6 C) 99.4 F (37.4 C) 98.5 F (36.9 C) 98 F (36.7 C)  TempSrc:      SpO2: 100% 96% 98% 96%  Weight: 155 lb 6.8 oz (70.5 kg)     Height:  (1.6 m)      Body mass index is 27.53 kg/m.  Physical Exam  Constitutional: He is oriented to person, place, and time. He appears well-developed and well-nourished. No distress.  Cardiovascular: Normal rate, regular rhythm, normal heart sounds and intact distal pulses. Exam reveals no gallop and no friction rub.  No murmur heard. Pulmonary/Chest: Effort normal and breath sounds normal. No stridor. No respiratory distress. He has no wheezes. He has no rales. He exhibits no tenderness.  Abdominal: Soft. Bowel sounds are normal. He exhibits no distension.  Neurological: He is alert and oriented to person, place, and time.  Skin: Skin is warm  and dry.  Psychiatric: He has a normal mood and affect.    Lab Results Lab Results  Component Value Date   WBC 6.1 05/19/2017   HGB 12.5 (L) 05/19/2017   HCT 37.9 (L) 05/19/2017   MCV 94.3 05/19/2017   PLT 145 (L) 05/19/2017    Lab Results  Component Value Date   CREATININE 0.73 05/19/2017   BUN 9 05/19/2017   NA 131 (L) 05/19/2017   K 3.7 05/19/2017   CL 99 (L) 05/19/2017   CO2 24 05/19/2017    Lab Results  Component Value Date   ALT 39 05/19/2017   AST 28 05/19/2017   ALKPHOS 32 (L) 05/19/2017   BILITOT 1.0  05/19/2017     Microbiology: Recent Results (from the past 240 hour(s))  Blood culture (routine x 2)     Status: Abnormal   Collection Time: 05/17/17  8:30 AM  Result Value Ref Range Status   Specimen Description BLOOD RIGHT HAND  Final   Special Requests   Final    BOTTLES DRAWN AEROBIC AND ANAEROBIC Blood Culture adequate volume   Culture  Setup Time   Final    GRAM POSITIVE COCCI Gram Stain Report Called to,Read Back By and Verified With: NURSE ODUNTAN NOTIFIED AT 0328 ON 05/18/2017 BY EVA IN BOTH AEROBIC AND ANAEROBIC BOTTLES Performed at [facility] AEROBIC AND ANAEROBIC BOTTLES    Culture VIRIDANS STREPTOCOCCUS (A)  Final   Report Status 05/20/2017 FINAL  Final   Organism ID, Bacteria VIRIDANS STREPTOCOCCUS  Final      Susceptibility   Viridans streptococcus - MIC*    PENICILLIN <=0.06 SENSITIVE Sensitive     CEFTRIAXONE 0.5 SENSITIVE Sensitive     ERYTHROMYCIN 2 RESISTANT Resistant     LEVOFLOXACIN >=16 RESISTANT Resistant     VANCOMYCIN 0.5 SENSITIVE Sensitive     * VIRIDANS STREPTOCOCCUS  Blood Culture ID Panel (Reflexed)     Status: Abnormal   Collection Time: 05/17/17  8:30 AM  Result Value Ref Range Status   Enterococcus species NOT DETECTED NOT DETECTED Final   Listeria monocytogenes NOT DETECTED NOT DETECTED Final   Staphylococcus species NOT DETECTED NOT DETECTED Final   Staphylococcus aureus NOT DETECTED NOT DETECTED Final   Streptococcus species DETECTED (A) NOT DETECTED Final    Comment: Not Enterococcus species, Streptococcus agalactiae, Streptococcus pyogenes, or Streptococcus pneumoniae. CRITICAL RESULT CALLED TO, READ BACK BY AND VERIFIED WITH: Patrick North PharmD 10:00 05/18/17 ([email or web address])    Streptococcus agalactiae NOT DETECTED NOT DETECTED Final   Streptococcus pneumoniae NOT DETECTED NOT DETECTED Final   Streptococcus pyogenes NOT DETECTED NOT DETECTED Final   Acinetobacter baumannii NOT DETECTED NOT DETECTED Final   Enterobacteriaceae  species NOT DETECTED NOT DETECTED Final   Enterobacter cloacae complex NOT DETECTED NOT DETECTED Final   Escherichia coli NOT DETECTED NOT DETECTED Final   Klebsiella oxytoca NOT DETECTED NOT DETECTED Final   Klebsiella pneumoniae NOT DETECTED NOT DETECTED Final   Proteus species NOT DETECTED NOT DETECTED Final   Serratia marcescens NOT DETECTED NOT DETECTED Final   Haemophilus influenzae NOT DETECTED NOT DETECTED Final   Neisseria meningitidis NOT DETECTED NOT DETECTED Final   Pseudomonas aeruginosa NOT DETECTED NOT DETECTED Final   Candida albicans NOT DETECTED NOT DETECTED Final   Candida glabrata NOT DETECTED NOT DETECTED Final   Candida krusei NOT DETECTED NOT DETECTED Final   Candida parapsilosis NOT DETECTED NOT DETECTED Final   Candida tropicalis NOT DETECTED NOT DETECTED Final  Blood culture (routine  x 2)     Status: Abnormal   Collection Time: 05/17/17  8:42 AM  Result Value Ref Range Status   Specimen Description   Final    BLOOD LEFT ARM DRAWN BY RN Performed at [facility], [address], [place], [place] [address]    Special Requests   Final    BOTTLES DRAWN AEROBIC AND ANAEROBIC Blood Culture adequate volume Performed at [facility], [address] [place], [place] [address]    Culture  Setup Time   Final    GRAM POSITIVE COCCI Gram Stain Report Called to,Read Back By and Verified With: NURSE ODUNTAN AT 0441 ON 05/18/2017 BY EVA ANAEROBIC BOTTLE ONLY Performed at [facility] IN BOTH AEROBIC AND ANAEROBIC BOTTLES    Culture (A)  Final    VIRIDANS STREPTOCOCCUS SUSCEPTIBILITIES PERFORMED ON PREVIOUS CULTURE WITHIN THE LAST 5 DAYS. Performed at [facility], [address] [place], [place] [address]    Report Status 05/20/2017 FINAL  Final  MRSA PCR Screening     Status: Abnormal   Collection Time: 05/18/17  6:37 AM  Result Value Ref Range Status   MRSA by PCR POSITIVE (A) NEGATIVE Final    Comment:        The GeneXpert MRSA Assay (FDA approved  for NASAL specimens only), is one component of a comprehensive MRSA colonization surveillance program. It is not intended to diagnose MRSA infection nor to guide or monitor treatment for MRSA infections. RESULT CALLED TO, READ BACK BY AND VERIFIED WITH: RN Milbert Coulter [id number] [phone] MLM Performed at [facility], [address] [place], [place] [address]   Culture, blood (routine x 2)     Status: None (Preliminary result)   Collection Time: 05/19/17  9:50 AM  Result Value Ref Range Status   Specimen Description BLOOD LEFT ANTECUBITAL  Final   Special Requests   Final    BOTTLES DRAWN AEROBIC ONLY Blood Culture adequate volume   Culture   Final    NO GROWTH < 24 HOURS Performed at [facility], [address], [place], [place] [address]    Report Status PENDING  Incomplete  Culture, blood (routine x 2)     Status: None (Preliminary result)   Collection Time: 05/19/17  9:55 AM  Result Value Ref Range Status   Specimen Description BLOOD RIGHT HAND  Final   Special Requests   Final    BOTTLES DRAWN AEROBIC ONLY Blood Culture adequate volume   Culture   Final    NO GROWTH < 24 HOURS Performed at [facility], [address] [place], [place] [address]    Report Status PENDING  Incomplete     Joshua Eke, NP [facility] [phone] Pager  05/20/2017  10:42 AM

## 2017-05-20 NOTE — Progress Notes (Signed)
[facility] Progress Note  Patient: Joshua Thomas [id number]:[id number]   PCP: Patient, No Pcp Per DOB: [birth year]   DOA: 05/17/2017   DOS: 05/20/2017   Date of Service: the patient was seen and examined on 05/20/2017  Subjective: No nausea no vomiting.  Has some back pain no fever no chills.  Mentions his mobility of the legs is better.  Brief hospital course: Pt. with PMH of bipolar disorder, chronic back pain, hepatitis C, GERD, active smoker, polysubstance abuse; admitted on 05/17/2017, presented with complaint of back pain, was found to have discitis. Currently further plan is IV antibiotics.  Assessment and Plan: 1.  Acute discitis.  With osteomyelitis of L2-3 Streptococcus viridans bacteremia. ID consulted. Blood cultures positive for strep viridans MRI positive for acute discitis as well as osteomyelitis. Patient was initially seen in the [facility], transferred here for IR work-up. IR consulted and felt that the patient is at high risk for regular IR guided biopsy and recommended anesthesia. Infectious disease recommends that the patient does not need any biopsy since his blood cultures are positive for Streptococcus already. Echocardiogram ordered for bacteremia. HIV negative, work-up for  hep C pending.  2.  Pain control. Chronic back pain. Patient is requesting multiple rounds of IV narcotic pain medication. He mentions that 1 mg of Dilaudid did not help his pain although 12.5 mcg of fentanyl resolve the pain completely. I would not label this patient as opioid tolerant given that he does not use opiates on a regular basis currently. Based on this I would avoid escalating narcotic regimen any further. Recommendation is to start the patient on Suboxone, I have placed a call to outpatient Suboxone management team to see whether the patient qualifies.  Appreciate their consultation.  Patient refused to be on Suboxone although information provided to patient as well as mother  regarding outpatient follow-up. Placing the patient on scheduled Robaxin  3.  Polysubstance abuse. Possible withdrawal. PRN Ativan.  4.  Active smoker. Tobacco cessation discussed.  5.  Bipolar disorder. Continue Seroquel and Cymbalta although the patient refused at this morning.  Diet: regular diet DVT Prophylaxis: subcutaneous Heparin  Advance goals of care discussion: full code  Family Communication: no family was present at bedside, at the time of interview.   Disposition:  Discharge to home. Ideally patient will require 6 weeks of IV antibiotics in a facility. Should the patient leave AMA patient will require 2 months of oral amoxicillin, and prior to discharge patient should receive 1 dose of IV oritavancin   Consultants: ID, IR, anesthesiology Procedures: none  Antibiotics: Anti-infectives (From admission, onward)   Start     Dose/Rate Route Frequency Ordered Stop   05/18/17 1100  cefTRIAXone (ROCEPHIN) 2 g in sodium chloride 0.9 % 100 mL IVPB     2 g 200 mL/hr over 30 Minutes Intravenous Every 24 hours 05/18/17 1006         Objective: Physical Exam: Vitals:   05/18/17 2355 05/19/17 2127 05/20/17 0512 05/20/17 1542  BP:  (!) 145/96 123/79 (!) 147/96  Pulse: (!) 104 97 92 88  Resp: Temp: 99.4 F (37.4 C) 98.5 F (36.9 C) 98 F (36.7 C) (!) 97.5 F (36.4 C)  TempSrc:    Oral  SpO2: 96% 98% 96% 100%  Weight:      Height:        Intake/Output Summary (Last 24 hours) at 05/20/2017 1740 Last data filed at 05/20/2017 1235 Gross per 24  hour  Intake 777 ml  Output 1325 ml  Net -548 ml   Filed Weights   05/18/17 0524  Weight: 70.5 kg (155 lb 6.8 oz)   General: Alert, Awake and Oriented to Time, Place and Person. Appear in marked distress, affect irritable Eyes: PERRL, Conjunctiva normal ENT: Oral Mucosa clear moist. Neck: no JVD, no Abnormal Mass Or lumps Cardiovascular: S1 and S2 Present, no Murmur, Peripheral Pulses Present Respiratory:  normal respiratory effort, Bilateral Air entry equal and Decreased, no use of accessory muscle, Clear to Auscultation, no Crackles, no wheezes Abdomen: Bowel Sound present, Soft and no tenderness, n hernia Skin: ono redness, no Rash, no induration Extremities: no Pedal edema, no calf tenderness Neurologic: Grossly no focal neuro deficit. Bilaterally Equal motor strength  Data Reviewed: CBC: Recent Labs  Lab 05/17/17 0825 05/18/17 0514 05/19/17 0954  WBC 6.2 6.5 6.1  NEUTROABS 3.8  --  4.4  HGB 13.0 13.8 12.5*  HCT 40.7 41.7 37.9*  MCV 96.2 96.1 94.3  PLT 133* 152 145*   Basic Metabolic Panel: Recent Labs  Lab 05/17/17 0825 05/18/17 0514 05/19/17 0954  NA 135 132* 131*  K 3.8 3.5 3.7  CL 101 99* 99*  CO2 GLUCOSE 107* 138* 135*  BUN CREATININE 0.82 0.81 0.73  CALCIUM 9.0 8.6* 8.3*  MG  --   --  1.8    Liver Function Tests: Recent Labs  Lab 05/17/17 0825 05/18/17 0514 05/19/17 0954  AST ALT 37 38 39  ALKPHOS 35* 37* 32*  BILITOT 0.9 0.9 1.0  PROT 7.1 6.7 6.3*  ALBUMIN 3.2* 2.8* 2.5*   No results for input(s): LIPASE, AMYLASE in the last 168 hours. No results for input(s): AMMONIA in the last 168 hours. Coagulation Profile: Recent Labs  Lab 05/18/17 0514  INR 1.11   Cardiac Enzymes: No results for input(s): CKTOTAL, CKMB, CKMBINDEX, TROPONINI in the last 168 hours. BNP (last 3 results) No results for input(s): PROBNP in the last 8760 hours. CBG: No results for input(s): GLUCAP in the last 168 hours. Studies: No results found.  Scheduled Meds: . Chlorhexidine Gluconate Cloth  6 each Topical Q0600  . DULoxetine  30 mg Oral Daily  . heparin  5,000 Units Subcutaneous Q8H  . ketorolac  30 mg Intravenous Q6H  . lidocaine  1 patch Transdermal Q24H  . methocarbamol  500 mg Oral TID  . mupirocin ointment  1 application Nasal BID  . nicotine  21 mg Transdermal Daily  . pantoprazole  40 mg Oral Daily  . QUEtiapine  100 mg Oral  BID  . senna-docusate  1 tablet Oral BID   Continuous Infusions: . sodium chloride 75 mL/hr at 05/19/17 1544  . cefTRIAXone (ROCEPHIN)  IV Stopped (05/20/17 1131)   PRN Meds: diphenhydrAMINE, fentaNYL (SUBLIMAZE) injection, LORazepam, ondansetron **OR** ondansetron (ZOFRAN) IV, oxyCODONE-acetaminophen, polyethylene glycol  Time spent: 35 minutes  Author: Lynden Oxford, MD [facility] Pager: [phone] 05/20/2017 5:40 PM  If 7PM-7AM, please contact night-coverage at [email or web address], password [facility]

## 2017-05-20 NOTE — Progress Notes (Signed)
  Echocardiogram 2D Echocardiogram has been performed.  Delcie Roch 05/20/2017, 3:55 PM

## 2017-05-20 NOTE — Consult Note (Signed)
Date: 05/20/2017               Patient Name:  Joshua Thomas MRN: [id number]  DOB: Jan 19, 1975 Age / Sex: 42 y.o., male   PCP: Patient, No Pcp Per         Requesting Physician: Dr. Rolly Salter, MD    Consulting Reason:  Opioid use disorder, evaluation for MAT     Chief Complaint: back pain  History of Present Illness: Joshua Thomas is a 42 year old male person who uses IV drugs, in addition he has a history of bipolar disorder, hepatitis C, GERD, chronic back pain.  He was admitted to the hospital for acute on chronic back pain and found to have lumbar discitis and osteomyelitis with Streptococcus bacteremia.  Dr. Allena Katz consulted me to see if he may be a candidate for medication assisted therapy for his opioid use disorder. He reports to me that he has been using IV drugs off and on for the last 14 years.  He injects IV heroin and bilateral arms but denies other injection sites.  He also uses cocaine he reports to me that this is also injected IV.  He is intermittently sought treatment for his opiate use, he reports that few years ago he was in maintenance with methadone however this became too expensive and cumbersome he subsequently stopped going.  He works as a Designer, fashion/clothing he reports that most of his money goes to support his drug habit he notes he has difficulty affording foods and other basic necessities due to this he is unable to have stable housing, this is caused him to recently move in with his mother.  Because of moving in with his mother he is not used as much recently he notes going through withdrawal at her house prior to presenting to the hospital.  Since moving in with her he is only used IV heroin intermittently. He notes that he is interested in Suboxone therapy he would be willing to come to [facility] for weekly or biweekly visits.  We discussed the nature of Suboxone therapy how to administer the medication.  However we discussed induction and stopping pain medication he became very  frustrated and concerned that he is just started to have some relief of his back pain and does not feel he is ready to start Suboxone yet.  This patient has Opioid Use Disorder by following DSM-V criteria:  - Opioids taken in larger amounts or over a longer period than intended - Persistent desire to cut down - A great deal of time is spent to obtain/use/recover from the opioid - Cravings to use opioids - Use resulting in a failure to fulfill major role obligations - Continue opioid use despite persistent social or interpersonal problems - Important activities are given up or reduced because of opioid use - Recurrent opioid use in situations in which it is physically hazardous - Use despite knowledge of health problems caused by opioids - Tolerance - Withdrawal  Allergies: Allergies as of 05/17/2017 - Review Complete 05/17/2017  Allergen Reaction Noted  . Acetaminophen Other (See Comments) 11/04/2010  . Morphine and related Itching 01/02/2011   Past Medical History:  Diagnosis Date  . ADHD (attention deficit hyperactivity disorder)   . Bipolar 1 disorder (HCC)   . Chronic back pain   . Chronic pain    chronic l leg pain  . DDD (degenerative disc disease), lumbar   . Degenerative disc disease, lumbar   . Depression   . GERD (  gastroesophageal reflux disease)   . Hepatitis C   . Hepatitis C   . History of stomach ulcers   . Sciatica   . Substance abuse (HCC)      Physical Exam: Blood pressure 123/79, pulse 92, temperature 98 F (36.7 C), resp. rate 20, height  (1.6 m), weight 155 lb 6.8 oz (70.5 kg), SpO2 96 %. General: resting in bed, occasional shifting in bed HEENT: no scleral icterus Ext: warm and well perfused, track marks visible in anticubital foss, no sweating, no piloerection  COWS score: 5   Assessment, Plan, & Recommendations by Problem:   Opioid use disorder, Moderate to Severe - Joshua Thomas meets criteria for Severe OUD, he would be a candidate for MAT with  buprenorphine.  I discussed this with him, at first he did seem interested. However he is concerned about his back pain and induction process of buprenorhpine.  unfortunately this has lead him to decide to decline this for now. - He does have a support system from his mother Joshua Thomas [phone]) who was present for my visit,  I provided her information for contacting our OUD clinic within the [facility] if he were to leave and change his mind. I would recommend continuing his current care, I discussed that I would be happy to stop back by on Monday next week if he remains in the hospital, hopefully he will have had continued improvement in his pain and be ready at that time to start Suboxone.    Acute osteomyelitis of lumbar spine (HCC)   Hepatitis C   GERD (gastroesophageal reflux disease)   Bipolar disorder (HCC)   Discitis   Streptococcal bacteremia -per primary.  Signed: Gust Rung, DO 05/20/2017, 11:20 AM

## 2017-05-21 LAB — CBC WITH DIFFERENTIAL/PLATELET
BASOS ABS: 0 10*3/uL (ref 0.0–0.1)
Basophils Relative: 0 %
Eosinophils Absolute: 0.1 10*3/uL (ref 0.0–0.7)
Eosinophils Relative: 1 %
HEMATOCRIT: 35.9 % — AB (ref 39.0–52.0)
Hemoglobin: 11.8 g/dL — ABNORMAL LOW (ref 13.0–17.0)
LYMPHS PCT: 20 %
Lymphs Abs: 1.1 10*3/uL (ref 0.7–4.0)
MCH: 31.1 pg (ref 26.0–34.0)
MCHC: 32.9 g/dL (ref 30.0–36.0)
MCV: 94.5 fL (ref 78.0–100.0)
MONO ABS: 0.6 10*3/uL (ref 0.1–1.0)
Monocytes Relative: 12 %
NEUTROS ABS: 3.5 10*3/uL (ref 1.7–7.7)
Neutrophils Relative %: 67 %
Platelets: 157 10*3/uL (ref 150–400)
RBC: 3.8 MIL/uL — ABNORMAL LOW (ref 4.22–5.81)
RDW: 13.2 % (ref 11.5–15.5)
WBC: 5.2 10*3/uL (ref 4.0–10.5)

## 2017-05-21 LAB — COMPREHENSIVE METABOLIC PANEL
ALK PHOS: 48 U/L (ref 38–126)
ALT: 51 U/L (ref 17–63)
AST: 34 U/L (ref 15–41)
Albumin: 2.3 g/dL — ABNORMAL LOW (ref 3.5–5.0)
Anion gap: 6 (ref 5–15)
BILIRUBIN TOTAL: 0.5 mg/dL (ref 0.3–1.2)
BUN: 12 mg/dL (ref 6–20)
CALCIUM: 8.5 mg/dL — AB (ref 8.9–10.3)
CO2: 28 mmol/L (ref 22–32)
CREATININE: 0.7 mg/dL (ref 0.61–1.24)
Chloride: 102 mmol/L (ref 101–111)
GFR calc Af Amer: >60 mL/min (ref >60)
Glucose, Bld: 173 mg/dL — ABNORMAL HIGH (ref 65–99)
POTASSIUM: 4.1 mmol/L (ref 3.5–5.1)
Sodium: 136 mmol/L (ref 135–145)
TOTAL PROTEIN: 6.1 g/dL — AB (ref 6.5–8.1)

## 2017-05-21 LAB — MAGNESIUM: MAGNESIUM: 1.6 mg/dL — AB (ref 1.7–2.4)

## 2017-05-21 MED ORDER — MAGNESIUM HYDROXIDE 400 MG/5ML PO SUSP
30.0000 mL | Freq: Once | ORAL | Status: AC
  Administered 2017-05-21: 30 mL via ORAL
  Filled 2017-05-21: qty 30

## 2017-05-21 MED ORDER — POLYETHYLENE GLYCOL 3350 17 G PO PACK
17.0000 g | PACK | Freq: Two times a day (BID) | ORAL | Status: DC
  Administered 2017-05-21 – 2017-05-25 (×8): 17 g via ORAL
  Filled 2017-05-21 (×9): qty 1

## 2017-05-21 MED ORDER — ENSURE ENLIVE PO LIQD
237.0000 mL | Freq: Two times a day (BID) | ORAL | Status: DC
  Administered 2017-05-21 – 2017-05-22 (×3): 237 mL via ORAL

## 2017-05-21 MED ORDER — MAGNESIUM SULFATE 2 GM/50ML IV SOLN
2.0000 g | Freq: Once | INTRAVENOUS | Status: AC
  Administered 2017-05-21: 2 g via INTRAVENOUS
  Filled 2017-05-21: qty 50

## 2017-05-21 NOTE — Progress Notes (Signed)
[facility] Progress Note  Patient: Joshua Thomas ZOX:[id number]   PCP: Patient, No Pcp Per DOB: [birth year]   DOA: 05/17/2017   DOS: 05/21/2017   Date of Service: the patient was seen and examined on 05/21/2017  Subjective: Patient is more cooperative.  Denies any acute complaint.  Reports pain well controlled.  No nausea no vomiting.  Concern about having constipation.  Brief hospital course: Pt. with PMH of bipolar disorder, chronic back pain, hepatitis C, GERD, active smoker, polysubstance abuse; admitted on 05/17/2017, presented with complaint of back pain, was found to have discitis. Currently further plan is IV antibiotics.  Assessment and Plan: 1.  Acute discitis.  With osteomyelitis of L2-3 Streptococcus viridans bacteremia. ID consulted. Blood cultures positive for strep viridans MRI positive for acute discitis as well as osteomyelitis. Patient was initially seen in the [facility], transferred here for IR work-up. IR consulted and felt that the patient is at high risk for regular IR guided biopsy and recommended anesthesia. Infectious disease recommends that the patient does not need any biopsy since his blood cultures are positive for Streptococcus already. Echocardiogram ordered for bacteremia, negative for any vegetation or valvular abnormality. HIV negative, work-up for  hep C pending.  2.  Pain control. Chronic back pain. Patient is requesting multiple rounds of IV narcotic pain medication. He mentions that 1 mg of Dilaudid did not help his pain although 12.5 mcg of fentanyl resolve the pain completely. I would not label this patient as opioid tolerant given that he does not use opiates on a regular basis currently. Based on this I would avoid escalating narcotic regimen any further. Recommendation is to start the patient on Suboxone, I have placed a call to outpatient Suboxone management team to see whether the patient qualifies.  Appreciate their consultation.   Patient refused to be on Suboxone although information provided to patient as well as mother regarding outpatient follow-up. Placing the patient on scheduled Robaxin  3.  Polysubstance abuse. Possible withdrawal. PRN Ativan.  4.  Active smoker. Tobacco cessation discussed.  5.  Bipolar disorder. Continue Seroquel and Cymbalta although the patient refused at this morning.  Diet: regular diet DVT Prophylaxis: subcutaneous Heparin  Advance goals of care discussion: full code  Family Communication: no family was present at bedside, at the time of interview.   Disposition:  Discharge to home. Ideally patient will require 6 weeks of IV antibiotics in a facility. Should the patient leave AMA patient will require 2 months of oral amoxicillin, and prior to discharge patient should receive 1 dose of IV oritavancin   Consultants: ID, IR, anesthesiology Procedures: none  Antibiotics: Anti-infectives (From admission, onward)   Start     Dose/Rate Route Frequency Ordered Stop   05/18/17 1100  cefTRIAXone (ROCEPHIN) 2 g in sodium chloride 0.9 % 100 mL IVPB     2 g 200 mL/hr over 30 Minutes Intravenous Every 24 hours 05/18/17 1006         Objective: Physical Exam: Vitals:   05/20/17 1542 05/20/17 2126 05/21/17 0529 05/21/17 1553  BP: (!) 147/96 (!) 146/105 (!) 147/93 (!) 154/94  Pulse: 88 100 97 84  Resp: Temp: (!) 97.5 F (36.4 C)  99.1 F (37.3 C) 98.3 F (36.8 C)  TempSrc: Oral   Oral  SpO2: 100% 100% 99% 97%  Weight:      Height:        Intake/Output Summary (Last 24 hours) at 05/21/2017 1556 Last data  filed at 05/21/2017 1529 Gross per 24 hour  Intake 240 ml  Output 2010 ml  Net -1770 ml   Filed Weights   05/18/17 0524  Weight: 70.5 kg (155 lb 6.8 oz)   General: Alert, Awake and Oriented to Time, Place and Person. Appear in marked distress, affect irritable Eyes: PERRL, Conjunctiva normal ENT: Oral Mucosa clear moist. Neck: no JVD, no Abnormal Mass  Or lumps Cardiovascular: S1 and S2 Present, no Murmur, Peripheral Pulses Present Respiratory: normal respiratory effort, Bilateral Air entry equal and Decreased, no use of accessory muscle, Clear to Auscultation, no Crackles, no wheezes Abdomen: Bowel Sound present, Soft and no tenderness, n hernia Skin: ono redness, no Rash, no induration Extremities: no Pedal edema, no calf tenderness Neurologic: Grossly no focal neuro deficit. Bilaterally Equal motor strength  Data Reviewed: CBC: Recent Labs  Lab 05/17/17 0825 05/18/17 0514 05/19/17 0954 05/21/17 0421  WBC 6.2 6.5 6.1 5.2  NEUTROABS 3.8  --  4.4 3.5  HGB 13.0 13.8 12.5* 11.8*  HCT 40.7 41.7 37.9* 35.9*  MCV 96.2 96.1 94.3 94.5  PLT 133* 152 145* 157   Basic Metabolic Panel: Recent Labs  Lab 05/17/17 0825 05/18/17 0514 05/19/17 0954 05/21/17 0421  NA 135 132* 131* 136  K 3.8 3.5 3.7 4.1  CL 101 99* 99* 102  CO2 GLUCOSE 107* 138* 135* 173*  BUN CREATININE 0.82 0.81 0.73 0.70  CALCIUM 9.0 8.6* 8.3* 8.5*  MG  --   --  1.8 1.6*    Liver Function Tests: Recent Labs  Lab 05/17/17 0825 05/18/17 0514 05/19/17 0954 05/21/17 0421  AST 34  ALT 37 38 39 51  ALKPHOS 35* 37* 32* 48  BILITOT 0.9 0.9 1.0 0.5  PROT 7.1 6.7 6.3* 6.1*  ALBUMIN 3.2* 2.8* 2.5* 2.3*   No results for input(s): LIPASE, AMYLASE in the last 168 hours. No results for input(s): AMMONIA in the last 168 hours. Coagulation Profile: Recent Labs  Lab 05/18/17 0514  INR 1.11   Cardiac Enzymes: No results for input(s): CKTOTAL, CKMB, CKMBINDEX, TROPONINI in the last 168 hours. BNP (last 3 results) No results for input(s): PROBNP in the last 8760 hours. CBG: No results for input(s): GLUCAP in the last 168 hours. Studies: No results found.  Scheduled Meds: . Chlorhexidine Gluconate Cloth  6 each Topical Q0600  . DULoxetine  30 mg Oral Daily  . feeding supplement (ENSURE ENLIVE)  237 mL Oral BID BM  . heparin   5,000 Units Subcutaneous Q8H  . ketorolac  30 mg Intravenous Q6H  . lidocaine  1 patch Transdermal Q24H  . magnesium hydroxide  30 mL Oral Once  . methocarbamol  500 mg Oral TID  . mupirocin ointment  1 application Nasal BID  . nicotine  21 mg Transdermal Daily  . pantoprazole  40 mg Oral Daily  . QUEtiapine  100 mg Oral BID  . senna-docusate  1 tablet Oral BID   Continuous Infusions: . cefTRIAXone (ROCEPHIN)  IV Stopped (05/21/17 1253)   PRN Meds: diphenhydrAMINE, fentaNYL (SUBLIMAZE) injection, LORazepam, ondansetron **OR** ondansetron (ZOFRAN) IV, oxyCODONE-acetaminophen, polyethylene glycol  Time spent: 35 minutes  Author: Lynden Oxford, MD [facility] Pager: [phone] 05/21/2017 3:56 PM  If 7PM-7AM, please contact night-coverage at [email or web address], password [facility]

## 2017-05-22 MED ORDER — FENTANYL CITRATE (PF) 100 MCG/2ML IJ SOLN
12.5000 ug | INTRAMUSCULAR | Status: DC | PRN
  Administered 2017-05-22 – 2017-05-25 (×10): 12.5 ug via INTRAVENOUS
  Filled 2017-05-22 (×11): qty 2

## 2017-05-22 MED ORDER — ACETAMINOPHEN 325 MG PO TABS: 650.0000 mg | ORAL_TABLET | Freq: Three times a day (TID) | ORAL | Status: DC

## 2017-05-22 MED ORDER — ENSURE ENLIVE PO LIQD
237.0000 mL | Freq: Three times a day (TID) | ORAL | Status: DC
  Administered 2017-05-22 – 2017-05-26 (×12): 237 mL via ORAL

## 2017-05-22 MED ORDER — MIRTAZAPINE 15 MG PO TABS: 15.0000 mg | ORAL_TABLET | Freq: Every day | ORAL | Status: DC

## 2017-05-22 MED ORDER — GABAPENTIN 100 MG PO CAPS
100.0000 mg | ORAL_CAPSULE | Freq: Three times a day (TID) | ORAL | Status: DC
  Administered 2017-05-22 – 2017-05-24 (×7): 100 mg via ORAL
  Filled 2017-05-22 (×7): qty 1

## 2017-05-22 MED ORDER — TRAZODONE HCL 50 MG PO TABS
50.0000 mg | ORAL_TABLET | Freq: Every day | ORAL | Status: DC
  Administered 2017-05-22 – 2017-05-25 (×4): 50 mg via ORAL
  Filled 2017-05-22 (×6): qty 1

## 2017-05-22 MED ORDER — TRAZODONE HCL 50 MG PO TABS: 50.0000 mg | ORAL_TABLET | Freq: Every day | ORAL | Status: DC

## 2017-05-22 MED ORDER — DULOXETINE HCL 20 MG PO CPEP
40.0000 mg | ORAL_CAPSULE | Freq: Every day | ORAL | Status: DC
Start: 2017-05-22 — End: 2017-05-26
  Administered 2017-05-22 – 2017-05-26 (×5): 40 mg via ORAL
  Filled 2017-05-22 (×6): qty 2

## 2017-05-22 MED ORDER — FAMOTIDINE 20 MG PO TABS
20.0000 mg | ORAL_TABLET | Freq: Every day | ORAL | Status: DC
  Administered 2017-05-22 – 2017-05-26 (×5): 20 mg via ORAL
  Filled 2017-05-22 (×5): qty 1

## 2017-05-22 MED ORDER — ENOXAPARIN SODIUM 40 MG/0.4ML ~~LOC~~ SOLN
40.0000 mg | SUBCUTANEOUS | Status: DC
  Administered 2017-05-22 – 2017-05-24 (×3): 40 mg via SUBCUTANEOUS
  Filled 2017-05-22 (×3): qty 0.4

## 2017-05-22 NOTE — Progress Notes (Signed)
[facility] Progress Note  Patient: Joshua Thomas ZOX:[id number]   PCP: Patient, No Pcp Per DOB: 12/17/1975   DOA: 05/17/2017   DOS: 05/22/2017   Date of Service: the patient was seen and examined on 05/22/2017  Subjective: Better.  No acute complaints were no acute events overnight.  Brief hospital course: Pt. with PMH of bipolar disorder, chronic back pain, hepatitis C, GERD, active smoker, polysubstance abuse; admitted on 05/17/2017, presented with complaint of back pain, was found to have discitis. Currently further plan is IV antibiotics.  Assessment and Plan: 1.  Acute discitis.  With osteomyelitis of L2-3 Streptococcus viridans bacteremia. ID consulted. Blood cultures positive for strep viridans MRI positive for acute discitis as well as osteomyelitis. Patient was initially seen in the [facility], transferred here for IR work-up. IR consulted and felt that the patient is at high risk for regular IR guided biopsy and recommended anesthesia. Infectious disease recommends that the patient does not need any biopsy since his blood cultures are positive for Streptococcus already. Echocardiogram ordered for bacteremia, negative for any vegetation or valvular abnormality. HIV negative, work-up for  hep C pending.  2.  Pain control. Chronic back pain. I would not label this patient as opioid tolerant given that he does not use opiates on a regular basis currently. Recommendation is to start the patient on Suboxone, I have placed a call to outpatient Suboxone management team to see whether the patient qualifies.  Appreciate their consultation.  Patient refused to be on Suboxone although information provided to patient as well as mother regarding outpatient follow-up. At present I will change patient's narcotic regimen, IV fentanyl 12.5 mg every 4 hours as needed, continue Percocet.  Add gabapentin, add trazodone. No scheduled Toradol, will continue until tomorrow.  Transition to  oral Aleve.  3.  Polysubstance abuse. Possible withdrawal. PRN Ativan.  4.  Active smoker. Tobacco cessation discussed.  5.  Bipolar disorder. Continue Seroquel and Cymbalta, Cymbalta dose increased.  Also add trazodone at night.  Diet: regular diet DVT Prophylaxis: subcutaneous Heparin  Advance goals of care discussion: full code  Family Communication: no family was present at bedside, at the time of interview.   Disposition:  Discharge to home. Ideally patient will require 6 weeks of IV antibiotics in a facility. Should the patient leave AMA patient will require 2 months of oral amoxicillin, and prior to discharge patient should receive 1 dose of IV oritavancin   Consultants: ID, IR, anesthesiology Procedures: none  Antibiotics: Anti-infectives (From admission, onward)   Start     Dose/Rate Route Frequency Ordered Stop   05/18/17 1100  cefTRIAXone (ROCEPHIN) 2 g in sodium chloride 0.9 % 100 mL IVPB     2 g 200 mL/hr over 30 Minutes Intravenous Every 24 hours 05/18/17 1006         Objective: Physical Exam: Vitals:   05/22/17 0701 05/22/17 0703 05/22/17 1440 05/22/17 1442  BP: (!) 164/114 (!) 156/100 (!) 148/100 (!) 160/104  Pulse: 68 66 88 73  Resp: 18  18   Temp: (!) 97.3 F (36.3 C)  98.2 F (36.8 C)   TempSrc: Oral  Oral   SpO2: 100%  98%   Weight:      Height:        Intake/Output Summary (Last 24 hours) at 05/22/2017 1548 Last data filed at 05/22/2017 0300 Gross per 24 hour  Intake -  Output 1600 ml  Net -1600 ml   Filed Weights   05/18/17 0524  Weight: 70.5 kg (155 lb 6.8 oz)   General: Alert, Awake and Oriented to Time, Place and Person. Appear in marked distress, affect irritable Eyes: PERRL, Conjunctiva normal ENT: Oral Mucosa clear moist. Neck: no JVD, no Abnormal Mass Or lumps Cardiovascular: S1 and S2 Present, no Murmur, Peripheral Pulses Present Respiratory: normal respiratory effort, Bilateral Air entry equal and Decreased, no use of  accessory muscle, Clear to Auscultation, no Crackles, no wheezes Abdomen: Bowel Sound present, Soft and no tenderness, n hernia Skin: ono redness, no Rash, no induration Extremities: no Pedal edema, no calf tenderness Neurologic: Grossly no focal neuro deficit. Bilaterally Equal motor strength  Data Reviewed: CBC: Recent Labs  Lab 05/17/17 0825 05/18/17 0514 05/19/17 0954 05/21/17 0421  WBC 6.2 6.5 6.1 5.2  NEUTROABS 3.8  --  4.4 3.5  HGB 13.0 13.8 12.5* 11.8*  HCT 40.7 41.7 37.9* 35.9*  MCV 96.2 96.1 94.3 94.5  PLT 133* 152 145* 157   Basic Metabolic Panel: Recent Labs  Lab 05/17/17 0825 05/18/17 0514 05/19/17 0954 05/21/17 0421  NA 135 132* 131* 136  K 3.8 3.5 3.7 4.1  CL 101 99* 99* 102  CO2 GLUCOSE 107* 138* 135* 173*  BUN CREATININE 0.82 0.81 0.73 0.70  CALCIUM 9.0 8.6* 8.3* 8.5*  MG  --   --  1.8 1.6*    Liver Function Tests: Recent Labs  Lab 05/17/17 0825 05/18/17 0514 05/19/17 0954 05/21/17 0421  AST 34  ALT 37 38 39 51  ALKPHOS 35* 37* 32* 48  BILITOT 0.9 0.9 1.0 0.5  PROT 7.1 6.7 6.3* 6.1*  ALBUMIN 3.2* 2.8* 2.5* 2.3*   No results for input(s): LIPASE, AMYLASE in the last 168 hours. No results for input(s): AMMONIA in the last 168 hours. Coagulation Profile: Recent Labs  Lab 05/18/17 0514  INR 1.11   Cardiac Enzymes: No results for input(s): CKTOTAL, CKMB, CKMBINDEX, TROPONINI in the last 168 hours. BNP (last 3 results) No results for input(s): PROBNP in the last 8760 hours. CBG: No results for input(s): GLUCAP in the last 168 hours. Studies: No results found.  Scheduled Meds: . Chlorhexidine Gluconate Cloth  6 each Topical Q0600  . DULoxetine  40 mg Oral Daily  . enoxaparin (LOVENOX) injection  40 mg Subcutaneous Q24H  . famotidine  20 mg Oral Daily  . feeding supplement (ENSURE ENLIVE)  237 mL Oral TID BM  . gabapentin  100 mg Oral TID  . ketorolac  30 mg Intravenous Q6H  . methocarbamol  500 mg  Oral TID  . mupirocin ointment  1 application Nasal BID  . nicotine  21 mg Transdermal Daily  . pantoprazole  40 mg Oral Daily  . polyethylene glycol  17 g Oral BID  . QUEtiapine  100 mg Oral BID  . senna-docusate  1 tablet Oral BID  . traZODone  50 mg Oral QHS   Continuous Infusions: . cefTRIAXone (ROCEPHIN)  IV Stopped (05/22/17 1130)   PRN Meds: diphenhydrAMINE, fentaNYL (SUBLIMAZE) injection, LORazepam, ondansetron **OR** ondansetron (ZOFRAN) IV, oxyCODONE-acetaminophen  Time spent: 35 minutes  Author: Lynden Oxford, MD [facility] Pager: [phone] 05/22/2017 3:48 PM  If 7PM-7AM, please contact night-coverage at [email or web address], password [facility]

## 2017-05-23 DIAGNOSIS — F1190 Opioid use, unspecified, uncomplicated: Secondary | ICD-10-CM

## 2017-05-23 DIAGNOSIS — B954 Other streptococcus as the cause of diseases classified elsewhere: Secondary | ICD-10-CM

## 2017-05-23 DIAGNOSIS — B1920 Unspecified viral hepatitis C without hepatic coma: Secondary | ICD-10-CM

## 2017-05-23 LAB — BASIC METABOLIC PANEL
Anion gap: 7 (ref 5–15)
BUN: 11 mg/dL (ref 6–20)
CO2: 30 mmol/L (ref 22–32)
CREATININE: 0.85 mg/dL (ref 0.61–1.24)
Calcium: 8.9 mg/dL (ref 8.9–10.3)
Chloride: 98 mmol/L — ABNORMAL LOW (ref 101–111)
GFR calc non Af Amer: >60 mL/min (ref >60)
Glucose, Bld: 147 mg/dL — ABNORMAL HIGH (ref 65–99)
Potassium: 3.9 mmol/L (ref 3.5–5.1)
SODIUM: 135 mmol/L (ref 135–145)

## 2017-05-23 LAB — CBC
HCT: 38.1 % — ABNORMAL LOW (ref 39.0–52.0)
HEMOGLOBIN: 12.2 g/dL — AB (ref 13.0–17.0)
MCH: 30.7 pg (ref 26.0–34.0)
MCHC: 32 g/dL (ref 30.0–36.0)
MCV: 95.7 fL (ref 78.0–100.0)
PLATELETS: 211 10*3/uL (ref 150–400)
RBC: 3.98 MIL/uL — ABNORMAL LOW (ref 4.22–5.81)
RDW: 13.2 % (ref 11.5–15.5)
WBC: 6 10*3/uL (ref 4.0–10.5)

## 2017-05-23 LAB — MAGNESIUM: MAGNESIUM: 2 mg/dL (ref 1.7–2.4)

## 2017-05-23 MED ORDER — NAPROXEN SODIUM 275 MG PO TABS: 550.0000 mg | ORAL_TABLET | Freq: Three times a day (TID) | ORAL | Status: DC

## 2017-05-23 MED ORDER — NAPROXEN 250 MG PO TABS
500.0000 mg | ORAL_TABLET | Freq: Three times a day (TID) | ORAL | Status: DC
  Administered 2017-05-24 – 2017-05-26 (×9): 500 mg via ORAL
  Filled 2017-05-23 (×9): qty 2

## 2017-05-23 NOTE — Progress Notes (Addendum)
[facility]  Date of Admission:  05/17/2017     Total days of antibiotics 6  Day 6 Ceftriaxone           Patient ID: Joshua Thomas is a 42 y.o. with       Principal Problem:   Acute osteomyelitis of lumbar spine (HCC) Active Problems:   Hepatitis C   Discitis   Streptococcal bacteremia   Opioid use disorder, moderate, dependence (HCC)   GERD (gastroesophageal reflux disease)   Bipolar disorder (HCC)   . Chlorhexidine Gluconate Cloth  6 each Topical Q0600  . DULoxetine  40 mg Oral Daily  . enoxaparin (LOVENOX) injection  40 mg Subcutaneous Q24H  . famotidine  20 mg Oral Daily  . feeding supplement (ENSURE ENLIVE)  237 mL Oral TID BM  . gabapentin  100 mg Oral TID  . ketorolac  30 mg Intravenous Q6H  . methocarbamol  500 mg Oral TID  . mupirocin ointment  1 application Nasal BID  . nicotine  21 mg Transdermal Daily  . pantoprazole  40 mg Oral Daily  . polyethylene glycol  17 g Oral BID  . QUEtiapine  100 mg Oral BID  . senna-docusate  1 tablet Oral BID  . traZODone  50 mg Oral QHS    SUBJECTIVE: In a lot of pain. Hopeful that the pain medications can help provide him with some relief. He tells me he injects drugs intermittently to help control anxiety / anger. He has never thought of himself as an addict and has not been in formal treatment programs in the past. He wonders how often these infections occur and what his outlook is.   Tolerating his antibiotics well from what he can tell.   Allergies  Allergen Reactions  . Acetaminophen Other (See Comments)    Due to Ulcers/ Liver Damage  . Morphine And Related Itching  . Buprenorphine Hcl Itching  . Meperidine Rash    OBJECTIVE: Vitals:   05/22/17 1440 05/22/17 1442 05/22/17 2231 05/23/17 0550  BP: (!) 148/100 (!) 160/104 (!) 129/91 (!) 154/116  Pulse: 88 73 67 80  Resp: Temp: 98.2 F (36.8 C)  98.6 F (37 C) 98.6 F (37 C)  TempSrc: Oral  Oral Oral  SpO2:  98%  98% 100%  Weight:      Height:       Body mass index is 27.53 kg/m.  Physical Exam  Constitutional: He is oriented to person, place, and time. He appears well-developed and well-nourished.  Lying in bed appears uncomfortable.   HENT:  Mouth/Throat: Oropharynx is clear and moist and mucous membranes are normal. Normal dentition. No dental abscesses.  Cardiovascular: Normal rate, regular rhythm and normal heart sounds.  Pulmonary/Chest: Effort normal and breath sounds normal.  Abdominal: Soft. He exhibits no distension. There is no tenderness.  Musculoskeletal:       Arms: Lymphadenopathy:    He has no cervical adenopathy.  Neurological: He is alert and oriented to person, place, and time.  Skin: Skin is warm and dry. No rash noted.  Psychiatric: He has a normal mood and affect. His behavior is normal. Judgment normal.  Vitals reviewed.   Lab Results Lab Results  Component Value Date   WBC 6.0 05/23/2017   HGB 12.2 (L) 05/23/2017   HCT 38.1 (L) 05/23/2017   MCV 95.7 05/23/2017   PLT 211 05/23/2017    Lab Results  Component Value Date   CREATININE 0.85 05/23/2017   BUN 11 05/23/2017   NA 135 05/23/2017   K 3.9 05/23/2017   CL 98 (L) 05/23/2017   CO2 30 05/23/2017    Lab Results  Component Value Date   ALT 51 05/21/2017   AST 34 05/21/2017   ALKPHOS 48 05/21/2017   BILITOT 0.5 05/21/2017     Microbiology: BCx 5/07 >> viridans strep (R-levaquin, erythromycin) BCx 5/09 >> NG   ASSESSMENT: Joshua Thomas is a 42 y.o. male with history of IVDU (intermittent for anxiety control), viridans streptococcal bacteremia and L2-3 osteomyelitis on MRI. He is in a lot of pain presently and trying to get comfortable. He is interested in abstaining further from injection use with rehab services. He tells me he is also willing to stay here for treatment or consider SNF placement if needed to "beat this thing."   Unfortunately resistant to Levaquin. Ideally would like to treat  with 6 weeks IV for vertebral discitis/osteomyelitis. He is now day 6. Discussed options orally - he is on several other medications that limit linezolid use. Likely would need several months of amoxicillin if he elects to leave prior to full course of IV. He is interested in staying for the IV if this is the best option considering his pain.   We did not discuss his Hepatitis C infection during this encounter.    PLAN: 1. Hold on PICC for now  2. Continue ceftriaxone 2 gm IV QD.  3. CSW consult please for consideration of SNF placement / rehab   Rexene Alberts, MSN, NP-C [facility] Infectious Disease [facility] Cell: [phone] Pager: [phone]  05/23/2017  12:22 PM

## 2017-05-23 NOTE — Progress Notes (Signed)
[facility] Progress Note  Patient: Joshua Thomas ZOX:[id number]   PCP: Patient, No Pcp Per DOB: 1975/08/26   DOA: 05/17/2017   DOS: 05/23/2017   Date of Service: the patient was seen and examined on 05/23/2017  Subjective: Feeling better, no nausea no vomiting.  Brief hospital course: Pt. with PMH of bipolar disorder, chronic back pain, hepatitis C, GERD, active smoker, polysubstance abuse; admitted on 05/17/2017, presented with complaint of back pain, was found to have discitis. Currently further plan is IV antibiotics.  Assessment and Plan: 1.  Acute discitis.  With osteomyelitis of L2-3 Streptococcus viridans bacteremia. ID consulted. Blood cultures positive for strep viridans MRI positive for acute discitis as well as osteomyelitis. Patient was initially seen in the [facility], transferred here for IR work-up. IR consulted and felt that the patient is at high risk for regular IR guided biopsy and recommended anesthesia. Infectious disease recommends that the patient does not need any biopsy since his blood cultures are positive for Streptococcus already. Echocardiogram ordered for bacteremia, negative for any vegetation or valvular abnormality. HIV negative, work-up for  hep C pending.  2.  Pain control. Chronic back pain. I would not label this patient as opioid tolerant given that he does not use opiates on a regular basis currently. Recommendation is to start the patient on Suboxone, I have placed a call to outpatient Suboxone management team to see whether the patient qualifies.  Appreciate their consultation.  Patient refused to be on Suboxone although information provided to patient as well as mother regarding outpatient follow-up. At present continue patient's narcotic regimen, IV fentanyl 12.5 mg every 4 hours as needed, continue Percocet.  Add gabapentin, add trazodone. Change Toradol to Aleve tomorrow  3.  Polysubstance abuse. Possible withdrawal. PRN  Ativan.  4.  Active smoker. Tobacco cessation discussed.  5.  Bipolar disorder. Continue Seroquel and Cymbalta, Cymbalta dose increased.  Also add trazodone at night.  Diet: regular diet DVT Prophylaxis: subcutaneous Heparin  Advance goals of care discussion: full code  Family Communication: no family was present at bedside, at the time of interview.   Disposition:  Discharge to home. Ideally patient will require 6 weeks of IV antibiotics in a facility. Should the patient leave AMA patient will require 2 months of oral amoxicillin, and prior to discharge patient should receive 1 dose of IV oritavancin   Consultants: ID, IR, anesthesiology Procedures: none  Antibiotics: Anti-infectives (From admission, onward)   Start     Dose/Rate Route Frequency Ordered Stop   05/18/17 1100  cefTRIAXone (ROCEPHIN) 2 g in sodium chloride 0.9 % 100 mL IVPB     2 g 200 mL/hr over 30 Minutes Intravenous Every 24 hours 05/18/17 1006         Objective: Physical Exam: Vitals:   05/22/17 1442 05/22/17 2231 05/23/17 0550 05/23/17 1407  BP: (!) 160/104 (!) 129/91 (!) 154/116 137/80  Pulse: 73 67 80 79  Resp:  Temp:  98.6 F (37 C) 98.6 F (37 C) 98.2 F (36.8 C)  TempSrc:  Oral Oral Oral  SpO2:  98% 100% 98%  Weight:      Height:        Intake/Output Summary (Last 24 hours) at 05/23/2017 1618 Last data filed at 05/23/2017 1147 Gross per 24 hour  Intake 236 ml  Output 2700 ml  Net -2464 ml   Filed Weights   05/18/17 0524  Weight: 70.5 kg (155 lb 6.8 oz)   General:  Alert, Awake and Oriented to Time, Place and Person. Appear in marked distress, affect irritable Eyes: PERRL, Conjunctiva normal ENT: Oral Mucosa clear moist. Neck: no JVD, no Abnormal Mass Or lumps Cardiovascular: S1 and S2 Present, no Murmur, Peripheral Pulses Present Respiratory: normal respiratory effort, Bilateral Air entry equal and Decreased, no use of accessory muscle, Clear to Auscultation, no  Crackles, no wheezes Abdomen: Bowel Sound present, Soft and no tenderness, n hernia Skin: ono redness, no Rash, no induration Extremities: no Pedal edema, no calf tenderness Neurologic: Grossly no focal neuro deficit. Bilaterally Equal motor strength  Data Reviewed: CBC: Recent Labs  Lab 05/17/17 0825 05/18/17 0514 05/19/17 0954 05/21/17 0421 05/23/17 0802  WBC 6.2 6.5 6.1 5.2 6.0  NEUTROABS 3.8  --  4.4 3.5  --   HGB 13.0 13.8 12.5* 11.8* 12.2*  HCT 40.7 41.7 37.9* 35.9* 38.1*  MCV 96.2 96.1 94.3 94.5 95.7  PLT 133* 152 145* 157 211   Basic Metabolic Panel: Recent Labs  Lab 05/17/17 0825 05/18/17 0514 05/19/17 0954 05/21/17 0421 05/23/17 0802  NA 135 132* 131* 136 135  K 3.8 3.5 3.7 4.1 3.9  CL 101 99* 99* 102 98*  CO2 GLUCOSE 107* 138* 135* 173* 147*  BUN CREATININE 0.82 0.81 0.73 0.70 0.85  CALCIUM 9.0 8.6* 8.3* 8.5* 8.9  MG  --   --  1.8 1.6* 2.0    Liver Function Tests: Recent Labs  Lab 05/17/17 0825 05/18/17 0514 05/19/17 0954 05/21/17 0421  AST 34  ALT 37 38 39 51  ALKPHOS 35* 37* 32* 48  BILITOT 0.9 0.9 1.0 0.5  PROT 7.1 6.7 6.3* 6.1*  ALBUMIN 3.2* 2.8* 2.5* 2.3*   No results for input(s): LIPASE, AMYLASE in the last 168 hours. No results for input(s): AMMONIA in the last 168 hours. Coagulation Profile: Recent Labs  Lab 05/18/17 0514  INR 1.11   Cardiac Enzymes: No results for input(s): CKTOTAL, CKMB, CKMBINDEX, TROPONINI in the last 168 hours. BNP (last 3 results) No results for input(s): PROBNP in the last 8760 hours. CBG: No results for input(s): GLUCAP in the last 168 hours. Studies: No results found.  Scheduled Meds: . Chlorhexidine Gluconate Cloth  6 each Topical Q0600  . DULoxetine  40 mg Oral Daily  . enoxaparin (LOVENOX) injection  40 mg Subcutaneous Q24H  . famotidine  20 mg Oral Daily  . feeding supplement (ENSURE ENLIVE)  237 mL Oral TID BM  . gabapentin  100 mg Oral TID  .  ketorolac  30 mg Intravenous Q6H  . methocarbamol  500 mg Oral TID  . mupirocin ointment  1 application Nasal BID  . nicotine  21 mg Transdermal Daily  . pantoprazole  40 mg Oral Daily  . polyethylene glycol  17 g Oral BID  . QUEtiapine  100 mg Oral BID  . senna-docusate  1 tablet Oral BID  . traZODone  50 mg Oral QHS   Continuous Infusions: . cefTRIAXone (ROCEPHIN)  IV Stopped (05/23/17 1109)   PRN Meds: diphenhydrAMINE, fentaNYL (SUBLIMAZE) injection, LORazepam, ondansetron **OR** ondansetron (ZOFRAN) IV, oxyCODONE-acetaminophen  Time spent: 35 minutes  Author: Lynden Oxford, MD [facility] Pager: [phone] 05/23/2017 4:18 PM  If 7PM-7AM, please contact night-coverage at [email or web address], password [facility]

## 2017-05-23 NOTE — Progress Notes (Signed)
Internal Medicine Attending/ OUD/MAT follow up:   I followed up with Mr. Aversa today, he is very appreciative of his care.  He reports to me that he still gets severe pain with small movements of his spine.  He does report that his pain was better controlled yesterday and notes that Toradol has helped more than he expected it would.  He has still been receiving opioid medications for breakthrough pain.  He remains interested in buprenorphine for MAT.  He tells me that infectious disease is considering his options for long-term therapy which may include a skilled nursing facility.  He is still unsure about when he would like to start the induction of buprenorphine, he remains in pain and is also concerned about his next steps.  I discussed with him that I understand his concerns we can defer induction of buprenorphine at this time.  I have given him my card, if he wishes to start MAT as an outpatient.  Otherwise Dr Oswaldo Done or I will follow up with him later this week if he remains an inpatient. Please call with questions or if plan changes, Gust Rung, [place] [phone]

## 2017-05-24 LAB — CULTURE, BLOOD (ROUTINE X 2)
Culture: NO GROWTH
Culture: NO GROWTH
Special Requests: ADEQUATE
Special Requests: ADEQUATE

## 2017-05-24 MED ORDER — GABAPENTIN 100 MG PO CAPS: 200.0000 mg | ORAL_CAPSULE | Freq: Three times a day (TID) | ORAL | Status: DC

## 2017-05-24 MED ORDER — GABAPENTIN 300 MG PO CAPS
300.0000 mg | ORAL_CAPSULE | Freq: Three times a day (TID) | ORAL | Status: DC
  Administered 2017-05-24 – 2017-05-26 (×7): 300 mg via ORAL
  Filled 2017-05-24 (×7): qty 1

## 2017-05-24 NOTE — Progress Notes (Signed)
INFECTIOUS DISEASE PROGRESS NOTE  ID: Joshua Thomas is a 42 y.o. male with  Principal Problem:   Acute osteomyelitis of lumbar spine (HCC) Active Problems:   Hepatitis C   Opioid use disorder, moderate, dependence (HCC)   GERD (gastroesophageal reflux disease)   Bipolar disorder (HCC)   Discitis   Streptococcal bacteremia  Subjective: C/o pain.   Abtx:  Anti-infectives (From admission, onward)   Start     Dose/Rate Route Frequency Ordered Stop   05/18/17 1100  cefTRIAXone (ROCEPHIN) 2 g in sodium chloride 0.9 % 100 mL IVPB     2 g 200 mL/hr over 30 Minutes Intravenous Every 24 hours 05/18/17 1006        Medications:  Scheduled: . DULoxetine  40 mg Oral Daily  . enoxaparin (LOVENOX) injection  40 mg Subcutaneous Q24H  . famotidine  20 mg Oral Daily  . feeding supplement (ENSURE ENLIVE)  237 mL Oral TID BM  . gabapentin  200 mg Oral TID  . methocarbamol  500 mg Oral TID  . naproxen  500 mg Oral TID WC  . nicotine  21 mg Transdermal Daily  . pantoprazole  40 mg Oral Daily  . polyethylene glycol  17 g Oral BID  . QUEtiapine  100 mg Oral BID  . senna-docusate  1 tablet Oral BID  . traZODone  50 mg Oral QHS    Objective: Vital signs in last 24 hours: Temp:  [97.7 F (36.5 C)-98.6 F (37 C)] 97.7 F (36.5 C) (05/14 0533) Pulse Rate:  [71-79] 71 (05/14 0533) Resp:  [17-18] 18 (05/14 0533) BP: (137-154)/(80-107) 151/107 (05/14 0533) SpO2:  [98 %-100 %] 98 % (05/14 0533)   General appearance: alert and no distress Resp: clear to auscultation bilaterally Cardio: regular rate and rhythm GI: normal findings: bowel sounds normal and soft, non-tender Extremities: edema none  Lab Results Recent Labs    05/23/17 0802  WBC 6.0  HGB 12.2*  HCT 38.1*  NA 135  K 3.9  CL 98*  CO2 30  BUN 11  CREATININE 0.85   Liver Panel No results for input(s): PROT, ALBUMIN, AST, ALT, ALKPHOS, BILITOT, BILIDIR, IBILI in the last 72 hours. Sedimentation Rate No results for  input(s): ESRSEDRATE in the last 72 hours. C-Reactive Protein No results for input(s): CRP in the last 72 hours.  Microbiology: Recent Results (from the past 240 hour(s))  Blood culture (routine x 2)     Status: Abnormal   Collection Time: 05/17/17  8:30 AM  Result Value Ref Range Status   Specimen Description BLOOD RIGHT HAND  Final   Special Requests   Final    BOTTLES DRAWN AEROBIC AND ANAEROBIC Blood Culture adequate volume   Culture  Setup Time   Final    GRAM POSITIVE COCCI Gram Stain Report Called to,Read Back By and Verified With: NURSE ODUNTAN NOTIFIED AT 0328 ON 05/18/2017 BY EVA IN BOTH AEROBIC AND ANAEROBIC BOTTLES Performed at [facility] AEROBIC AND ANAEROBIC BOTTLES    Culture VIRIDANS STREPTOCOCCUS (A)  Final   Report Status 05/20/2017 FINAL  Final   Organism ID, Bacteria VIRIDANS STREPTOCOCCUS  Final      Susceptibility   Viridans streptococcus - MIC*    PENICILLIN <=0.06 SENSITIVE Sensitive     CEFTRIAXONE 0.5 SENSITIVE Sensitive     ERYTHROMYCIN 2 RESISTANT Resistant     LEVOFLOXACIN >=16 RESISTANT Resistant     VANCOMYCIN 0.5 SENSITIVE Sensitive     * VIRIDANS STREPTOCOCCUS  Blood Culture ID Panel (Reflexed)     Status: Abnormal   Collection Time: 05/17/17  8:30 AM  Result Value Ref Range Status   Enterococcus species NOT DETECTED NOT DETECTED Final   Listeria monocytogenes NOT DETECTED NOT DETECTED Final   Staphylococcus species NOT DETECTED NOT DETECTED Final   Staphylococcus aureus NOT DETECTED NOT DETECTED Final   Streptococcus species DETECTED (A) NOT DETECTED Final    Comment: Not Enterococcus species, Streptococcus agalactiae, Streptococcus pyogenes, or Streptococcus pneumoniae. CRITICAL RESULT CALLED TO, READ BACK BY AND VERIFIED WITH: Patrick North PharmD 10:00 05/18/17 ([email or web address])    Streptococcus agalactiae NOT DETECTED NOT DETECTED Final   Streptococcus pneumoniae NOT DETECTED NOT DETECTED Final   Streptococcus pyogenes NOT DETECTED NOT  DETECTED Final   Acinetobacter baumannii NOT DETECTED NOT DETECTED Final   Enterobacteriaceae species NOT DETECTED NOT DETECTED Final   Enterobacter cloacae complex NOT DETECTED NOT DETECTED Final   Escherichia coli NOT DETECTED NOT DETECTED Final   Klebsiella oxytoca NOT DETECTED NOT DETECTED Final   Klebsiella pneumoniae NOT DETECTED NOT DETECTED Final   Proteus species NOT DETECTED NOT DETECTED Final   Serratia marcescens NOT DETECTED NOT DETECTED Final   Haemophilus influenzae NOT DETECTED NOT DETECTED Final   Neisseria meningitidis NOT DETECTED NOT DETECTED Final   Pseudomonas aeruginosa NOT DETECTED NOT DETECTED Final   Candida albicans NOT DETECTED NOT DETECTED Final   Candida glabrata NOT DETECTED NOT DETECTED Final   Candida krusei NOT DETECTED NOT DETECTED Final   Candida parapsilosis NOT DETECTED NOT DETECTED Final   Candida tropicalis NOT DETECTED NOT DETECTED Final  Blood culture (routine x 2)     Status: Abnormal   Collection Time: 05/17/17  8:42 AM  Result Value Ref Range Status   Specimen Description   Final    BLOOD LEFT ARM DRAWN BY RN Performed at [facility], [address] [place], [place] [address]    Special Requests   Final    BOTTLES DRAWN AEROBIC AND ANAEROBIC Blood Culture adequate volume Performed at [facility], [address], [place], [place] [address]    Culture  Setup Time   Final    GRAM POSITIVE COCCI Gram Stain Report Called to,Read Back By and Verified With: NURSE ODUNTAN AT 0441 ON 05/18/2017 BY EVA ANAEROBIC BOTTLE ONLY Performed at [facility] IN BOTH AEROBIC AND ANAEROBIC BOTTLES    Culture (A)  Final    VIRIDANS STREPTOCOCCUS SUSCEPTIBILITIES PERFORMED ON PREVIOUS CULTURE WITHIN THE LAST 5 DAYS. Performed at [facility], [address], [place], [place] [address]    Report Status 05/20/2017 FINAL  Final  MRSA PCR Screening     Status: Abnormal   Collection Time: 05/18/17  6:37 AM  Result Value Ref Range Status   MRSA  by PCR POSITIVE (A) NEGATIVE Final    Comment:        The GeneXpert MRSA Assay (FDA approved for NASAL specimens only), is one component of a comprehensive MRSA colonization surveillance program. It is not intended to diagnose MRSA infection nor to guide or monitor treatment for MRSA infections. RESULT CALLED TO, READ BACK BY AND VERIFIED WITH: RN Milbert Coulter [phone] MLM Performed at [facility], [address] [place], [place] [address]   Culture, blood (routine x 2)     Status: None   Collection Time: 05/19/17  9:50 AM  Result Value Ref Range Status   Specimen Description BLOOD LEFT ANTECUBITAL  Final   Special Requests  Final    BOTTLES DRAWN AEROBIC ONLY Blood Culture adequate volume   Culture   Final    NO GROWTH 5 DAYS Performed at [facility], [address], [place], [place] [address]    Report Status 05/24/2017 FINAL  Final  Culture, blood (routine x 2)     Status: None   Collection Time: 05/19/17  9:55 AM  Result Value Ref Range Status   Specimen Description BLOOD RIGHT HAND  Final   Special Requests   Final    BOTTLES DRAWN AEROBIC ONLY Blood Culture adequate volume   Culture   Final    NO GROWTH 5 DAYS Performed at [facility], [address] [place], [place] [address]    Report Status 05/24/2017 FINAL  Final    Studies/Results: No results found.   Assessment/Plan: Osteo of lumbar spine VIridans strep bacteremia Opioid use d/o Hepatitis C  Total days of antibiotics: 7 ceftriaxone  Would continue ceftriaxone while in hospital  At d/c he will need 6 more weeks of oral agent (amoxil  tid) Appreciate Dr Neita Garnet help with suboxone Available as needed.          Johny Sax MD, FACP Infectious Diseases (pager) [phone] [email or web address] 05/24/2017, 1:10 PM  LOS: 7 days

## 2017-05-24 NOTE — Progress Notes (Signed)
Physical Therapy Treatment Patient Details Name: Joshua Thomas MRN: [id number] DOB: 05/03/1975 Today's Date: 05/24/2017    History of Present Illness Pt. with PMH of bipolar disorder, chronic back pain, hepatitis C, GERD, active smoker, polysubstance abuse; admitted on 05/17/2017, presented with complaint of back pain, was found to have discitis/osteomyelitits    PT Comments    Patient agreeable to participate in PT this am. Pt overall required supervision/min guard for safety with all mobility and able to ambulate 128ft. Pt relies heavily on RW for bilat UE support.  Continue to progress as tolerated.   Follow Up Recommendations  No PT follow up     Equipment Recommendations  Rolling walker with 5" wheels    Recommendations for Other Services       Precautions / Restrictions Precautions Precautions: Back Restrictions Weight Bearing Restrictions: No    Mobility  Bed Mobility Overal bed mobility: Needs Assistance Bed Mobility: Rolling Rolling: Supervision         General bed mobility comments: supervision for safety; cues for log roll technique; pt used bed rail to assist with rolling   Transfers Overall transfer level: Needs assistance Equipment used: Rolling walker (2 wheeled) Transfers: Sit to/from Stand Sit to Stand: Min guard         General transfer comment: min guard for safety; cues for safe hand placement however pt tends to pull up on RW   Ambulation/Gait Ambulation/Gait assistance: Supervision;Min guard Ambulation Distance (Feet): 100 Feet Assistive device: Rolling walker (2 wheeled) Gait Pattern/deviations: Step-through pattern;Decreased stride length Gait velocity: decreased   General Gait Details: very heavy reliance on bilat UE and RW for support; grossly steady and supervision required for safety however with pt had difficulty with turning to sit down needing min guard    Stairs             Wheelchair Mobility    Modified Rankin  (Stroke Patients Only)       Balance                                            Cognition Arousal/Alertness: Awake/alert Behavior During Therapy: WFL for tasks assessed/performed(gets agitated easily and apologizes) Overall Cognitive Status: Within Functional Limits for tasks assessed                                        Exercises      General Comments        Pertinent Vitals/Pain Pain Assessment: Faces Faces Pain Scale: Hurts little more Pain Location: R lower back Pain Descriptors / Indicators: Aching;Sore Pain Intervention(s): Limited activity within patient's tolerance;Monitored during session;Premedicated before session;Repositioned;Heat applied    Home Living                      Prior Function            PT Goals (current goals can now be found in the care plan section) Acute Rehab PT Goals Patient Stated Goal: less pain PT Goal Formulation: With patient Time For Goal Achievement: 06/02/17 Potential to Achieve Goals: Good Progress towards PT goals: Progressing toward goals    Frequency    Min 2X/week      PT Plan Current plan remains appropriate    Co-evaluation  AM-PAC PT "6 Clicks" Daily Activity  Outcome Measure  Difficulty turning over in bed (including adjusting bedclothes, sheets and blankets)?: Unable Difficulty moving from lying on back to sitting on the side of the bed? : Unable Difficulty sitting down on and standing up from a chair with arms (e.g., wheelchair, bedside commode, etc,.)?: Unable Help needed moving to and from a bed to chair (including a wheelchair)?: A Little Help needed walking in hospital room?: A Little Help needed climbing 3-5 steps with a railing? : A Lot 6 Click Score: 11    End of Session Equipment Utilized During Treatment: Gait belt Activity Tolerance: Patient tolerated treatment well Patient left: with call bell/phone within reach;in  chair Nurse Communication: Mobility status PT Visit Diagnosis: Difficulty in walking, not elsewhere classified (R26.2);Pain Pain - part of body: (back)     Time: 1001-1035 PT Time Calculation (min) (ACUTE ONLY): 34 min  Charges:  $Gait Training: 8-22 mins $Therapeutic Activity: 8-22 mins                    G Codes:       Erline Levine, PTA Pager: [phone]     Carolynne Edouard 05/24/2017, 10:43 AM

## 2017-05-24 NOTE — Progress Notes (Signed)
[facility] Progress Note  Patient: Joshua Thomas ZOX:[id number]   PCP: Patient, No Pcp Per DOB: Apr 25, 1975   DOA: 05/17/2017   DOS: 05/24/2017   Date of Service: the patient was seen and examined on 05/24/2017  Subjective: No acute complaint no nausea no vomiting.  Walked 50 feet with physical therapy today.  Brief hospital course: Pt. with PMH of bipolar disorder, chronic back pain, hepatitis C, GERD, active smoker, polysubstance abuse; admitted on 05/17/2017, presented with complaint of back pain, was found to have discitis. Currently further plan is IV antibiotics.  Assessment and Plan: 1.  Acute discitis.  With osteomyelitis of L2-3 Streptococcus viridans bacteremia. ID consulted. Blood cultures positive for strep viridans MRI positive for acute discitis as well as osteomyelitis. [facility] was [facility], transferred here for IR work-up. IR consulted and felt that the patient is at high risk for regular IR guided biopsy and recommended anesthesia. Infectious disease recommends that the patient does not need any biopsy since his blood cultures are positive for Streptococcus already. Echocardiogram ordered for bacteremia, negative for any vegetation or valvular abnormality. HIV negative, work-up for  hep C pending. Infectious disease recommended initially to assess patient's eligibility for SNF, social worker felt that the patient is not a candidate for SNF for long-term IV antibiotics given his history. They were also recommending that the patient may require oritavancin prior to discharge.  Now infectious disease final recommendation is continuing ceftriaxone while in the hospital, on discharge give 6 more weeks of oral amoxicillin 500 mg 3 times daily.  ID has signed off, I called ID Dr. Ninetta Lights and verified that patient does not require oritavancin prior to discharge.  2.  Pain control. Chronic back pain. I would not label this patient as opioid  tolerant given that he does not use opiates on a regular basis currently. ID recommendation is to start the patient on Suboxone, I have placed a call to outpatient Suboxone management team to see whether the patient qualifies.  Appreciate their consultation.  Patient refused to be on Suboxone although information provided to patient as well as mother regarding outpatient follow-up. On presentation narcotic regimen Dilaudid 1 mg every 4 hours as needed. Transition to fentanyl now on 12.5 mg every 4 hours as needed. Oral pain control patient is on Percocet. Patient was informed that we will start titrating down his pain regimen. Patient was also informed that I will uptitrate gabapentin to goal dose of 300 mg 3 times daily. Continue trazodone for insomnia. Changing Toradol to Aleve. Pepcid added. Physical therapy will be continued on discharge.  3.  Polysubstance abuse. Possible withdrawal. PRN Ativan.  Transition from IV to oral. Now out of withdrawal period.  4.  Active smoker. Tobacco cessation discussed.  5.  Bipolar disorder. Continue Seroquel and Cymbalta, Cymbalta dose increased for pain control and agitation. Also add trazodone at night.  Diet: regular diet DVT Prophylaxis: subcutaneous Heparin  Advance goals of care discussion: full code  Family Communication: no family was present at bedside, at the time of interview.   Disposition:  Discharge to home, per PT no therapy needed. Home regimen of oral amoxicillin 500 mg 3 times daily for 6 weeks.  Consultants: ID, IR, anesthesiology Procedures: Echocardiogram   Antibiotics: Anti-infectives (From admission, onward)   Start     Dose/Rate Route Frequency Ordered Stop   05/18/17 1100  cefTRIAXone (ROCEPHIN) 2 g in sodium chloride 0.9 % 100 mL IVPB  2 g 200 mL/hr over 30 Minutes Intravenous Every 24 hours 05/18/17 1006         Objective: Physical Exam: Vitals:   05/23/17 2300 05/24/17 0531 05/24/17 0533 05/24/17  1345  BP: (!) 150/95 (!) 154/100 (!) 151/107 (!) 155/104  Pulse: 71 75 71 90  Resp: Temp: 98.6 F (37 C) 97.7 F (36.5 C) 97.7 F (36.5 C) 97.9 F (36.6 C)  TempSrc: Oral     SpO2: 100% 98% 98% 98%  Weight:      Height:        Intake/Output Summary (Last 24 hours) at 05/24/2017 1513 Last data filed at 05/24/2017 1000 Gross per 24 hour  Intake 240 ml  Output 850 ml  Net -610 ml   Filed Weights   05/18/17 0524  Weight: 70.5 kg (155 lb 6.8 oz)   General: Alert, Awake and Oriented to Time, Place and Person. Appear in mild distress, affect irritable Eyes: PERRL, Conjunctiva normal ENT: Oral Mucosa clear moist. Neck: no JVD, no Abnormal Mass Or lumps Cardiovascular: S1 and S2 Present, no Murmur, Peripheral Pulses Present Respiratory: normal respiratory effort, Bilateral Air entry equal and Decreased, no use of accessory muscle, Clear to Auscultation, no Crackles, no wheezes Abdomen: Bowel Sound present, Soft and no tenderness, n hernia Skin: ono redness, no Rash, no induration Extremities: no Pedal edema, no calf tenderness Neurologic: Grossly no focal neuro deficit. Bilaterally Equal motor strength  Data Reviewed: CBC: Recent Labs  Lab 05/18/17 0514 05/19/17 0954 05/21/17 0421 05/23/17 0802  WBC 6.5 6.1 5.2 6.0  NEUTROABS  --  4.4 3.5  --   HGB 13.8 12.5* 11.8* 12.2*  HCT 41.7 37.9* 35.9* 38.1*  MCV 96.1 94.3 94.5 95.7  PLT 152 145* 157 211   Basic Metabolic Panel: Recent Labs  Lab 05/18/17 0514 05/19/17 0954 05/21/17 0421 05/23/17 0802  NA 132* 131* 136 135  K 3.5 3.7 4.1 3.9  CL 99* 99* 102 98*  CO2 GLUCOSE 138* 135* 173* 147*  BUN CREATININE 0.81 0.73 0.70 0.85  CALCIUM 8.6* 8.3* 8.5* 8.9  MG  --  1.8 1.6* 2.0    Liver Function Tests: Recent Labs  Lab 05/18/17 0514 05/19/17 0954 05/21/17 0421  AST 30 28 34  ALT 38 39 51  ALKPHOS 37* 32* 48  BILITOT 0.9 1.0 0.5  PROT 6.7 6.3* 6.1*  ALBUMIN 2.8* 2.5* 2.3*     No results for input(s): LIPASE, AMYLASE in the last 168 hours. No results for input(s): AMMONIA in the last 168 hours. Coagulation Profile: Recent Labs  Lab 05/18/17 0514  INR 1.11   Cardiac Enzymes: No results for input(s): CKTOTAL, CKMB, CKMBINDEX, TROPONINI in the last 168 hours. BNP (last 3 results) No results for input(s): PROBNP in the last 8760 hours. CBG: No results for input(s): GLUCAP in the last 168 hours. Studies: No results found.  Scheduled Meds: . DULoxetine  40 mg Oral Daily  . enoxaparin (LOVENOX) injection  40 mg Subcutaneous Q24H  . famotidine  20 mg Oral Daily  . feeding supplement (ENSURE ENLIVE)  237 mL Oral TID BM  . gabapentin  300 mg Oral TID  . methocarbamol  500 mg Oral TID  . naproxen  500 mg Oral TID WC  . nicotine  21 mg Transdermal Daily  . pantoprazole  40 mg Oral Daily  . polyethylene glycol  17 g Oral BID  . QUEtiapine  100 mg Oral BID  . senna-docusate  1 tablet Oral BID  . traZODone  50 mg Oral QHS   Continuous Infusions: . cefTRIAXone (ROCEPHIN)  IV Stopped (05/24/17 1250)   PRN Meds: diphenhydrAMINE, fentaNYL (SUBLIMAZE) injection, LORazepam, ondansetron **OR** ondansetron (ZOFRAN) IV, oxyCODONE-acetaminophen  Time spent: 35 minutes  Author: Lynden Oxford, MD [facility] Pager: [phone] 05/24/2017 3:13 PM  If 7PM-7AM, please contact night-coverage at [email or web address], password [facility]

## 2017-05-24 NOTE — Progress Notes (Signed)
Provided Ad materials and patient knows to call for chaplain if he wants to complete this while in hospital. He wanted time to go over it and think about it before completing it. Phebe Colla, Chaplain   05/24/17 1100  Clinical Encounter Type  Visited With Patient  Visit Type Initial;Other (Comment) (AD Materials for the Patient)  Referral From Physician  Consult/Referral To Chaplain

## 2017-05-24 NOTE — Progress Notes (Addendum)
Discussed case with Medical Advisor. Patient not appropriate for SNF placement for IV antibiotics. Please contact for questions.    CSW signing off.  Osborne Casco Debby Clyne LCSW [phone]

## 2017-05-25 MED ORDER — OXYCODONE HCL 5 MG PO TABS
10.0000 mg | ORAL_TABLET | Freq: Four times a day (QID) | ORAL | Status: DC | PRN
  Administered 2017-05-25 – 2017-05-26 (×5): 10 mg via ORAL
  Filled 2017-05-25 (×5): qty 2

## 2017-05-25 NOTE — Care Management Note (Signed)
Case Management Note  Patient Details  Name: Joshua Thomas MRN: [id number] Date of Birth: [birth year]  Subjective/Objective:    Admitted with  acute osteomyelitis of lumbar spine. Hx of  bipolar disorder, chronic back pain, hepatitis C, GERD, tobacco abuse and polysubstance abuse. Resides with mom. Independent with ADL's , no DME usage PTA.  Fawn Kirk (Mother) Warren Lacy [phone]     PCP:  Action/Plan: Transition to home when medically stable.  Post hospital follow up scheduled for 06/01/2017 @ 11:10 am with Georgian Co NP @ the [facility]. NCM made pt aware.  Pt states will have transportation to home once d/c.  NCM will continue to monitor  Rx meds @ d/c....pt might need Match Letter  Expected Discharge Date:                  Expected Discharge Plan:  Home/Self Care  In-House Referral:  Clinical Social Work  Discharge planning Services  CM Consult  Post Acute Care Choice:    Choice offered to:     DME Arranged:   N/A DME Agency:   N/A  HH Arranged:   N/A HH Agency:   N/A  Status of Service:  In process, will continue to follow  If discussed at Long Length of Stay Meetings, dates discussed:    Additional Comments:  Epifanio Lesches, RN 05/25/2017, 11:47 AM

## 2017-05-25 NOTE — Progress Notes (Signed)
PROGRESS NOTE    Joshua Thomas  ZOX:[id number] DOB: [birth year] DOA: 05/17/2017 PCP: Patient, No Pcp Per   Brief Narrative:   42 year old with a history of bipolar disorder, chronic back pain, hepatitis C, tobacco use, polysubstance abuse disorder came to the hospital on 5/7 with complaints of back pain.  Upon evaluation he was found to have acute discitis with osteomyelitis of L2-L3.  Blood cultures were positive for strep viridans and MRI confirmed osteomyelitis as well.  Initially interventional radiology thought he was at high risk for regular IR guided biopsy but due to already positive blood cultures infectious disease recommended holding off biopsy.  Echocardiogram was negative for any vegetation.  HIV was negative.  Infectious disease was consulted and patient has been on IV Rocephin with plans of continuing it for 6 weeks and later transitioning to amoxicillin 500 mg 3 times daily.  His hospital stay has been complicated by issues with chronic back pain and pain control.  Assessment & Plan:   Principal Problem:   Acute osteomyelitis of lumbar spine (HCC) Active Problems:   Hepatitis C   Opioid use disorder, moderate, dependence (HCC)   GERD (gastroesophageal reflux disease)   Bipolar disorder (HCC)   Discitis   Streptococcal bacteremia  Back pain secondary to acute discitis/osteomyelitis L2-L3 Streptococcus viridans bacteremia, improved - MRI consistent with osteomyelitis.  IR biopsy not done as it was not thought to be high risk and his blood cultures are already positive.  Echocardiogram was negative for any vegetation.  Infectious disease recommended IV Rocephin and later switching to amoxicillin at the time of discharge - We will plan on discharging him on amoxicillin 500 mg 3 times daily for total of 6 weeks. -Appreciate infectious disease input  Chronic back pain -Previously has been heavily using cocaine and heroin.  Here he is on fentanyl IV 12.5 mcg every 4 hours as  needed which I will discontinue.  Stop his Percocet and start oral oxycodone 10 mg intermediate release every 6 hours -Continue NSAIDs, continue gabapentin 300 mg 3 times daily -I have spoken with Dr. Mikey Bussing, is willing to see the patient in his clinic on 05/31/17 for buprenorphine induction.  At this time patient is reluctant to go undergo this induction and experience withdrawal symptoms but I have extensively explained to him that he has limited options at this time given lack of insurance and unable to afford extra out of pocket cost.  -On bowel regimen -On trazodone for insomnia  History of depression -On Cymbalta and Seroquel.   Already been evaluated by physical therapy  DVT prophylaxis: Lovenox SQ Code Status: Full Code  Family Communication:  None at bedside  Disposition Plan: Maintain inpatient stay for another 24 hrs.   Consultants:   ID  Procedures:   None  Antimicrobials:  Anti-infectives (From admission, onward)   Start     Dose/Rate Route Frequency Ordered Stop   05/18/17 1100  cefTRIAXone (ROCEPHIN) 2 g in sodium chloride 0.9 % 100 mL IVPB     2 g 200 mL/hr over 30 Minutes Intravenous Every 24 hours 05/18/17 1006        Subjective: Reports of back pain. Shows concerns of undergoing withdrawal but doesn't necessarily want to go through induction. He understand how important it is and will try.   Review of Systems Otherwise negative except as per HPI, including: General: Denies fever, chills, night sweats or unintended weight loss. Resp: Denies cough, wheezing, shortness of breath. Cardiac: Denies chest pain, palpitations, orthopnea,  paroxysmal nocturnal dyspnea. GI: Denies abdominal pain, nausea, vomiting, diarrhea or constipation GU: Denies dysuria, frequency, hesitancy or incontinence MS: Denies muscle aches, joint pain or swelling Neuro: Denies headache, neurologic deficits (focal weakness, numbness, tingling), abnormal gait Psych: Denies anxiety,  depression, SI/HI/AVH Skin: Denies new rashes or lesions ID: Denies sick contacts, exotic exposures, travel  Objective: Vitals:   05/24/17 1345 05/24/17 2219 05/25/17 0404 05/25/17 0840  BP: (!) 155/104 (!) 177/113 (!) 142/96 (!) 163/111  Pulse: 90 76 79 76  Resp: 19 18    Temp: 97.9 F (36.6 C) 97.8 F (36.6 C) 97.8 F (36.6 C) 97.9 F (36.6 C)  TempSrc:  Oral Oral Oral  SpO2: 98% 98% 98%   Weight:      Height:        Intake/Output Summary (Last 24 hours) at 05/25/2017 1212 Last data filed at 05/25/2017 0300 Gross per 24 hour  Intake 240 ml  Output 1250 ml  Net -1010 ml   Filed Weights   05/18/17 0524  Weight: 70.5 kg (155 lb 6.8 oz)    Examination:  General exam: Appears calm and comfortable  Respiratory system: Clear to auscultation. Respiratory effort normal. Cardiovascular system: S1 & S2 heard, RRR. No JVD, murmurs, rubs, gallops or clicks. No pedal edema. Gastrointestinal system: Abdomen is nondistended, soft and nontender. No organomegaly or masses felt. Normal bowel sounds heard. Central nervous system: Alert and oriented. No focal neurological deficits. Extremities: Symmetric 5 x 5 power. Skin: No rashes, lesions or ulcers Psychiatry: Judgement and insight appear normal. Mood & affect appropriate.   Data Reviewed:   CBC: Recent Labs  Lab 05/19/17 0954 05/21/17 0421 05/23/17 0802  WBC 6.1 5.2 6.0  NEUTROABS 4.4 3.5  --   HGB 12.5* 11.8* 12.2*  HCT 37.9* 35.9* 38.1*  MCV 94.3 94.5 95.7  PLT 145* 157 211   Basic Metabolic Panel: Recent Labs  Lab 05/19/17 0954 05/21/17 0421 05/23/17 0802  NA 131* 136 135  K 3.7 4.1 3.9  CL 99* 102 98*  CO2 GLUCOSE 135* 173* 147*  BUN CREATININE 0.73 0.70 0.85  CALCIUM 8.3* 8.5* 8.9  MG 1.8 1.6* 2.0   GFR: Estimated Creatinine Clearance: 99.8 mL/min (by C-G formula based on SCr of 0.85 mg/dL). Liver Function Tests: Recent Labs  Lab 05/19/17 0954 05/21/17 0421  AST 28 34  ALT 39  51  ALKPHOS 32* 48  BILITOT 1.0 0.5  PROT 6.3* 6.1*  ALBUMIN 2.5* 2.3*   No results for input(s): LIPASE, AMYLASE in the last 168 hours. No results for input(s): AMMONIA in the last 168 hours. Coagulation Profile: No results for input(s): INR, PROTIME in the last 168 hours. Cardiac Enzymes: No results for input(s): CKTOTAL, CKMB, CKMBINDEX, TROPONINI in the last 168 hours. BNP (last 3 results) No results for input(s): PROBNP in the last 8760 hours. HbA1C: No results for input(s): HGBA1C in the last 72 hours. CBG: No results for input(s): GLUCAP in the last 168 hours. Lipid Profile: No results for input(s): CHOL, HDL, LDLCALC, TRIG, CHOLHDL, LDLDIRECT in the last 72 hours. Thyroid Function Tests: No results for input(s): TSH, T4TOTAL, FREET4, T3FREE, THYROIDAB in the last 72 hours. Anemia Panel: No results for input(s): VITAMINB12, FOLATE, FERRITIN, TIBC, IRON, RETICCTPCT in the last 72 hours. Sepsis Labs: No results for input(s): PROCALCITON, LATICACIDVEN in the last 168 hours.  Recent Results (from the past 240 hour(s))  Blood culture (routine x 2)  Status: Abnormal   Collection Time: 05/17/17  8:30 AM  Result Value Ref Range Status   Specimen Description BLOOD RIGHT HAND  Final   Special Requests   Final    BOTTLES DRAWN AEROBIC AND ANAEROBIC Blood Culture adequate volume   Culture  Setup Time   Final    GRAM POSITIVE COCCI Gram Stain Report Called to,Read Back By and Verified With: NURSE ODUNTAN NOTIFIED AT 0328 ON 05/18/2017 BY EVA IN BOTH AEROBIC AND ANAEROBIC BOTTLES Performed at [facility] AEROBIC AND ANAEROBIC BOTTLES    Culture VIRIDANS STREPTOCOCCUS (A)  Final   Report Status 05/20/2017 FINAL  Final   Organism ID, Bacteria VIRIDANS STREPTOCOCCUS  Final      Susceptibility   Viridans streptococcus - MIC*    PENICILLIN <=0.06 SENSITIVE Sensitive     CEFTRIAXONE 0.5 SENSITIVE Sensitive     ERYTHROMYCIN 2 RESISTANT Resistant     LEVOFLOXACIN >=16  RESISTANT Resistant     VANCOMYCIN 0.5 SENSITIVE Sensitive     * VIRIDANS STREPTOCOCCUS  Blood Culture ID Panel (Reflexed)     Status: Abnormal   Collection Time: 05/17/17  8:30 AM  Result Value Ref Range Status   Enterococcus species NOT DETECTED NOT DETECTED Final   Listeria monocytogenes NOT DETECTED NOT DETECTED Final   Staphylococcus species NOT DETECTED NOT DETECTED Final   Staphylococcus aureus NOT DETECTED NOT DETECTED Final   Streptococcus species DETECTED (A) NOT DETECTED Final    Comment: Not Enterococcus species, Streptococcus agalactiae, Streptococcus pyogenes, or Streptococcus pneumoniae. CRITICAL RESULT CALLED TO, READ BACK BY AND VERIFIED WITH: Patrick North PharmD 10:00 05/18/17 ([email or web address])    Streptococcus agalactiae NOT DETECTED NOT DETECTED Final   Streptococcus pneumoniae NOT DETECTED NOT DETECTED Final   Streptococcus pyogenes NOT DETECTED NOT DETECTED Final   Acinetobacter baumannii NOT DETECTED NOT DETECTED Final   Enterobacteriaceae species NOT DETECTED NOT DETECTED Final   Enterobacter cloacae complex NOT DETECTED NOT DETECTED Final   Escherichia coli NOT DETECTED NOT DETECTED Final   Klebsiella oxytoca NOT DETECTED NOT DETECTED Final   Klebsiella pneumoniae NOT DETECTED NOT DETECTED Final   Proteus species NOT DETECTED NOT DETECTED Final   Serratia marcescens NOT DETECTED NOT DETECTED Final   Haemophilus influenzae NOT DETECTED NOT DETECTED Final   Neisseria meningitidis NOT DETECTED NOT DETECTED Final   Pseudomonas aeruginosa NOT DETECTED NOT DETECTED Final   Candida albicans NOT DETECTED NOT DETECTED Final   Candida glabrata NOT DETECTED NOT DETECTED Final   Candida krusei NOT DETECTED NOT DETECTED Final   Candida parapsilosis NOT DETECTED NOT DETECTED Final   Candida tropicalis NOT DETECTED NOT DETECTED Final  Blood culture (routine x 2)     Status: Abnormal   Collection Time: 05/17/17  8:42 AM  Result Value Ref Range Status   Specimen Description    Final    BLOOD LEFT ARM DRAWN BY RN Performed at [facility], [address] [place], [place] [address]    Special Requests   Final    BOTTLES DRAWN AEROBIC AND ANAEROBIC Blood Culture adequate volume Performed at [facility], [address], [place], [place] [address]    Culture  Setup Time   Final    GRAM POSITIVE COCCI Gram Stain Report Called to,Read Back By and Verified With: NURSE ODUNTAN AT 0441 ON 05/18/2017 BY EVA ANAEROBIC BOTTLE ONLY Performed at [facility] IN BOTH AEROBIC AND ANAEROBIC BOTTLES    Culture (A)  Final    VIRIDANS STREPTOCOCCUS SUSCEPTIBILITIES PERFORMED ON  PREVIOUS CULTURE WITHIN THE LAST 5 DAYS. Performed at [facility], [address], [place], [place] [address]    Report Status 05/20/2017 FINAL  Final  MRSA PCR Screening     Status: Abnormal   Collection Time: 05/18/17  6:37 AM  Result Value Ref Range Status   MRSA by PCR POSITIVE (A) NEGATIVE Final    Comment:        The GeneXpert MRSA Assay (FDA approved for NASAL specimens only), is one component of a comprehensive MRSA colonization surveillance program. It is not intended to diagnose MRSA infection nor to guide or monitor treatment for MRSA infections. RESULT CALLED TO, READ BACK BY AND VERIFIED WITH: RN Milbert Coulter [id number] Performed at [facility], [address] [place], [place] [address]   Culture, blood (routine x 2)     Status: None   Collection Time: 05/19/17  9:50 AM  Result Value Ref Range Status   Specimen Description BLOOD LEFT ANTECUBITAL  Final   Special Requests   Final    BOTTLES DRAWN AEROBIC ONLY Blood Culture adequate volume   Culture   Final    NO GROWTH 5 DAYS Performed at [facility], [address] [place], [place] [address]    Report Status 05/24/2017 FINAL  Final  Culture, blood (routine x 2)     Status: None   Collection Time: 05/19/17  9:55 AM  Result Value Ref Range Status   Specimen Description BLOOD RIGHT HAND  Final    Special Requests   Final    BOTTLES DRAWN AEROBIC ONLY Blood Culture adequate volume   Culture   Final    NO GROWTH 5 DAYS Performed at [facility], [address] [place], [place] [address]    Report Status 05/24/2017 FINAL  Final         Radiology Studies: No results found.      Scheduled Meds: . DULoxetine  40 mg Oral Daily  . enoxaparin (LOVENOX) injection  40 mg Subcutaneous Q24H  . famotidine  20 mg Oral Daily  . feeding supplement (ENSURE ENLIVE)  237 mL Oral TID BM  . gabapentin  300 mg Oral TID  . methocarbamol  500 mg Oral TID  . naproxen  500 mg Oral TID WC  . nicotine  21 mg Transdermal Daily  . pantoprazole  40 mg Oral Daily  . polyethylene glycol  17 g Oral BID  . QUEtiapine  100 mg Oral BID  . senna-docusate  1 tablet Oral BID  . traZODone  50 mg Oral QHS   Continuous Infusions: . cefTRIAXone (ROCEPHIN)  IV Stopped (05/24/17 1250)     LOS: 8 days    I have spent 35 minutes face to face with the patient and on the ward discussing the patients care, assessment, plan and disposition with other care givers. >50% of the time was devoted counseling the patient about the risks and benefits of treatment and coordinating care.     Ankit Joline Maxcy, MD [facility] Pager [phone]   If 7PM-7AM, please contact night-coverage [email or web address] Password [email or web address] 05/25/2017, 12:12 PM

## 2017-05-26 LAB — BASIC METABOLIC PANEL
ANION GAP: 11 (ref 5–15)
BUN: 14 mg/dL (ref 6–20)
CALCIUM: 9.5 mg/dL (ref 8.9–10.3)
CO2: 26 mmol/L (ref 22–32)
CREATININE: 0.89 mg/dL (ref 0.61–1.24)
Chloride: 98 mmol/L — ABNORMAL LOW (ref 101–111)
Glucose, Bld: 236 mg/dL — ABNORMAL HIGH (ref 65–99)
Potassium: 4.6 mmol/L (ref 3.5–5.1)
SODIUM: 135 mmol/L (ref 135–145)

## 2017-05-26 LAB — CBC
HCT: 42 % (ref 39.0–52.0)
HEMOGLOBIN: 13.6 g/dL (ref 13.0–17.0)
MCH: 31 pg (ref 26.0–34.0)
MCHC: 32.4 g/dL (ref 30.0–36.0)
MCV: 95.7 fL (ref 78.0–100.0)
PLATELETS: 261 10*3/uL (ref 150–400)
RBC: 4.39 MIL/uL (ref 4.22–5.81)
RDW: 13.1 % (ref 11.5–15.5)
WBC: 6.1 10*3/uL (ref 4.0–10.5)

## 2017-05-26 LAB — HEPATITIS C GENOTYPE

## 2017-05-26 LAB — MAGNESIUM: MAGNESIUM: 2 mg/dL (ref 1.7–2.4)

## 2017-05-26 MED ORDER — POLYETHYLENE GLYCOL 3350 17 G PO PACK: 17.0000 g | PACK | Freq: Every day | ORAL | 0 refills | Status: DC | PRN

## 2017-05-26 MED ORDER — AMOXICILLIN 500 MG PO CAPS: 500.0000 mg | ORAL_CAPSULE | Freq: Three times a day (TID) | ORAL | 0 refills | Status: DC

## 2017-05-26 MED ORDER — SENNOSIDES-DOCUSATE SODIUM 8.6-50 MG PO TABS: 1.0000 | ORAL_TABLET | Freq: Every evening | ORAL | 0 refills | Status: DC | PRN

## 2017-05-26 MED ORDER — KETOROLAC TROMETHAMINE 30 MG/ML IJ SOLN
30.0000 mg | Freq: Once | INTRAMUSCULAR | Status: AC
  Administered 2017-05-26: 30 mg via INTRAVENOUS
  Filled 2017-05-26: qty 1

## 2017-05-26 MED ORDER — KETOROLAC TROMETHAMINE 15 MG/ML IJ SOLN
15.0000 mg | Freq: Once | INTRAMUSCULAR | Status: AC
  Administered 2017-05-26: 15 mg via INTRAVENOUS
  Filled 2017-05-26: qty 1

## 2017-05-26 MED ORDER — METHOCARBAMOL 500 MG PO TABS: 500.0000 mg | ORAL_TABLET | Freq: Three times a day (TID) | ORAL | 0 refills | Status: AC

## 2017-05-26 MED ORDER — PANTOPRAZOLE SODIUM 40 MG PO TBEC: 40.0000 mg | DELAYED_RELEASE_TABLET | Freq: Every day | ORAL | 0 refills | Status: DC

## 2017-05-26 MED ORDER — GABAPENTIN 300 MG PO CAPS: 300.0000 mg | ORAL_CAPSULE | Freq: Three times a day (TID) | ORAL | 0 refills | Status: DC

## 2017-05-26 MED ORDER — TRAZODONE HCL 50 MG PO TABS: 50.0000 mg | ORAL_TABLET | Freq: Every evening | ORAL | 0 refills | Status: DC | PRN

## 2017-05-26 MED ORDER — NAPROXEN 500 MG PO TABS: 500.0000 mg | ORAL_TABLET | Freq: Three times a day (TID) | ORAL | 0 refills | Status: AC

## 2017-05-26 MED ORDER — OXYCODONE HCL 10 MG PO TABS: 10.0000 mg | ORAL_TABLET | Freq: Four times a day (QID) | ORAL | 0 refills | Status: DC | PRN

## 2017-05-26 MED ORDER — NICOTINE 21 MG/24HR TD PT24: 21.0000 mg | MEDICATED_PATCH | Freq: Every day | TRANSDERMAL | 0 refills | Status: DC

## 2017-05-26 NOTE — Progress Notes (Signed)
*  Prior to change of shift, charge nurse Ihor Dow was in contact with case management attempting to acquire a walker for the patient.*  While the patient was being transferred from the bed to his wheelchair by NT Percell Locus for discharge, Percell Locus noted that a member of the patient's family was recording her.  The family member that was recording was an individual with blonde hair, wearing glasses and a pink baseball cap.  While the patient was being transferred to the wheelchair, he stated that "I'm going to give them hell, because this isn't right for me to be going home in this condition."  He also stated to [place] that she was being recorded.  This nurse arrived to the room and removed the patient's PIV.  This nurse gave the patient his discharge summary and prescriptions.  Of note, the patient refused to sign the discharge summary, and this nurse was unable to obtain a copy of the signature page before the patient left.    As the patient was being taken downstairs, it was noted by the family that the walker had not been provided to the patient.  This nurse reached out to the charge nurse Sonia Baller, who then attempted to contact the case manager, with no success.  She also attempted to call the emergency department to acquire a walker for the patient, but the ED case manager was unaware of the situation and did not have a walker available.    While all of this was taking place, this nurse met the patient, Percell Locus, and his family at the main entrance in the [place] tower and informed them that we were attempting to acquire a walker for him.  The family member in the pink baseball cap then proceeded to take the wheelchair and patient from the nurse tech Percell Locus and said "we're leaving."  At this point, this nurse called the charge nurse to find out if a walker had been acquired.  The patient then stated that "I don't need to wait for a walker, my sister is a disabled veteran and she has a walker I can use."  This nurse again  asked the patient for clarity if he was choosing to leave without a walker and use a walker he has at home instead, and the patient stated "yes."  The patient was then assisted into the vehicle by his family and they departed.

## 2017-05-26 NOTE — Care Management Note (Addendum)
Case Management Note  Patient Details  Name: Joshua Thomas MRN: [id number] Date of Birth: 01-03-1976  Subjective/Objective:      Acute osteomyelitis               Action/Plan: Transition to home. NCM provided pt with Match Letter to assist with Rx meds. Pt states he has no money, doesn't work and no one (family) to help with medication expense. Post hospital follow up and to establish PCP, scheduled for 06/01/2017 at 11:10 am at the [facility], pt without PCP/no insurance.  Expected Discharge Date:  05/26/17               Expected Discharge Plan:  Home/Self Care  In-House Referral:  Clinical Social Work  Discharge planning Services  CM Consult, Follow-up appt scheduled, [facility], [facility]  Post Acute Care Choice:    Choice offered to:     DME Arranged:   N/A DME Agency:   N/A  HH Arranged:   N/A HH Agency:   N/A  Status of Service:  Completed, signed off  If discussed at Long Length of Stay Meetings, dates discussed:    Additional Comments:  Epifanio Lesches, RN 05/26/2017, 1:36 PM

## 2017-05-26 NOTE — Progress Notes (Signed)
9:55 PM ED evening CSW received phone call from the floor pt was discharged from. Pt reported that he went to get prescription filled and match letter did not work. CSW notified daytime CM about issue with match letter.    Montine Circle, [facility]  [phone]

## 2017-05-26 NOTE — Discharge Summary (Signed)
Physician Discharge Summary  Joshua Thomas VWU:[id number] DOB: [birth year] DOA: 05/17/2017  PCP: Patient, No Pcp Per  Admit date: 05/17/2017 Discharge date: 05/26/2017  Admitted From: Home  Disposition:  Home   Recommendations for Outpatient Follow-up:  1. Follow up with PCP in 1 weeks-community [facility] center information given 2. Please obtain BMP/CBC in one week your next doctors visit.  3. Follow-up with infectious disease in 2-4 weeks 4. Follow-up at [facility] for induction on Tuesday 05/31/17. Dr Clemencia Course aware of this patient.  5. Patient has been given the following prescriptions- amoxicillin 500 mg to be taken 3 times daily for 45 days, gabapentin 30 mg to be taken 3 times daily, Robaxin 500 mg to be taken 3 times daily for 7 days, naproxen 500 mg to be taken 3 times daily with meals for 7 days, nicotine patch 21 mg every 24 hours, oxycodone 10 mg to be taken every 6 hours for severe breakthrough pain for up to 5 days, MiraLAX, senna, trazodone 50 mg to be taken at bedtime to help sleep-15 doses  Home Health: None Equipment/Devices: None Discharge Condition: Stable CODE STATUS: Full code Diet recommendation: Regular  Brief/Interim Summary: 42 year old with a history of bipolar disorder, chronic back pain, hepatitis C, tobacco use, polysubstance abuse disorder came to the hospital on 5/7 with complaints of back pain.  Upon evaluation he was found to have acute discitis with osteomyelitis of L2-L3.  Blood cultures were positive for strep viridans and MRI confirmed osteomyelitis as well.  Initially interventional radiology thought he was at high risk for regular IR guided biopsy but due to already positive blood cultures infectious disease recommended holding off biopsy.  Echocardiogram was negative for any vegetation.  HIV was negative.  Infectious disease was consulted and patient has been on IV Rocephin with plans of continuing it for 6 weeks and later transitioning to amoxicillin 500  mg 3 times daily.  His hospital stay has been complicated by issues with chronic back pain and pain control. I spoke with Dr. Mikey Bussing from [facility] will be going to see him on upcoming Tuesday on 05/31/17.  Patient is aware of this and he he will follow-up.  He is also very comes to follow-up in the community wellness center and infectious disease.    Discharge Diagnoses:  Principal Problem:   Acute osteomyelitis of lumbar spine (HCC) Active Problems:   Hepatitis C   Opioid use disorder, moderate, dependence (HCC)   GERD (gastroesophageal reflux disease)   Bipolar disorder (HCC)   Discitis   Streptococcal bacteremia   Back pain secondary to acute discitis/osteomyelitis L2-L3;improving Streptococcus viridans bacteremia, improved - MRI consistent with osteomyelitis.  IR biopsy not done as it was not thought to be high risk and his blood cultures are already positive.  Echocardiogram was negative for any vegetation.  Infectious disease recommended IV Rocephin and later switching to amoxicillin at the time of discharge -amoxicillin 500 mg 3 times daily for total of 6 weeks. -Appreciate infectious disease input- will need to follow up outpatient. -Due to history of drug abuse I recommended he seek outpatient rehabilitation.  Chronic back pain -Previously has been heavily using cocaine and heroin. Currently doing on Oxycodone, Robaxin, Gabapentin and NSAIDs -I have spoken with Dr. Mikey Bussing, is willing to see the patient in his clinic on 05/31/17 for buprenorphine induction.  At this time patient is reluctant to go undergo this induction and experience withdrawal symptoms but I have extensively explained to him that he has limited  options at this time given lack of insurance and unable to afford extra out of pocket cost.  -On bowel regimen -On trazodone for insomnia  History of depression -On Cymbalta and Seroquel.   Lovenox subcu while he was here for DVT prophylaxis Full  code  Due to patient's history of noncompliance and IV drug abuse he is at high risk for readmission.  Discharge Instructions   Allergies as of 05/26/2017      Reactions   Acetaminophen Other (See Comments)   Due to Ulcers/ Liver Damage   Morphine And Related Itching   Buprenorphine Hcl Itching   Meperidine Rash      Medication List    STOP taking these medications   DULoxetine 30 MG capsule Commonly known as:  CYMBALTA   QUEtiapine 100 MG tablet Commonly known as:  SEROQUEL     TAKE these medications   amoxicillin 500 MG capsule Commonly known as:  AMOXIL Take 1 capsule (500 mg total) by mouth 3 (three) times daily.   gabapentin 300 MG capsule Commonly known as:  NEURONTIN Take 1 capsule (300 mg total) by mouth 3 (three) times daily.   methocarbamol 500 MG tablet Commonly known as:  ROBAXIN Take 1 tablet (500 mg total) by mouth 3 (three) times daily for 7 days.   naproxen 500 MG tablet Commonly known as:  NAPROSYN Take 1 tablet (500 mg total) by mouth 3 (three) times daily with meals for 7 days.   nicotine 21 mg/24hr patch Commonly known as:  NICODERM CQ - dosed in mg/24 hours Place 1 patch (21 mg total) onto the skin daily for 28 days. Start taking on:  05/27/2017   Oxycodone HCl 10 MG Tabs Take 1 tablet (10 mg total) by mouth every 6 (six) hours as needed for up to 5 days for severe pain or breakthrough pain.   pantoprazole 40 MG tablet Commonly known as:  PROTONIX Take 1 tablet (40 mg total) by mouth daily. Start taking on:  05/27/2017   polyethylene glycol packet Commonly known as:  MIRALAX / GLYCOLAX Take 17 g by mouth daily as needed for up to 15 doses for severe constipation.   senna-docusate 8.6-50 MG tablet Commonly known as:  Senokot-S Take 1 tablet by mouth at bedtime as needed for up to 15 doses for mild constipation.   traZODone 50 MG tablet Commonly known as:  DESYREL Take 1 tablet (50 mg total) by mouth at bedtime as needed for up to 15  doses for sleep.      Follow-up Information    [facility]. Call on 06/01/2017.   Why:  11:10 am with Georgian Co NP Contact information: [address] [place] [address] [phone]       [facility]             . Schedule an appointment as soon as possible for a visit in 4 week(s).   Why:  Call for follow up visit with Dr. Ninetta Lights or Judeth Cornfield for follow up on back infection and hepatitis C treatment.  Contact information: [address] [place] [address]         Allergies  Allergen Reactions  . Acetaminophen Other (See Comments)    Due to Ulcers/ Liver Damage  . Morphine And Related Itching  . Buprenorphine Hcl Itching  . Meperidine Rash    You were cared for by a hospitalist during your hospital stay. If  you have any questions about your discharge medications or the care you received while you were in the hospital after you are discharged, you can call the unit and asked to speak with the hospitalist on call if the hospitalist that took care of you is not available. Once you are discharged, your primary care physician will handle any further medical issues. Please note that no refills for any discharge medications will be authorized once you are discharged, as it is imperative that you return to your primary care physician (or establish a relationship with a primary care physician if you do not have one) for your aftercare needs so that they can reassess your need for medications and monitor your lab values.  Consultations:  Dr. Mikey Bussing from [facility]  Infectious disease   Procedures/Studies: Dg Lumbar Spine Complete  Result Date: 05/15/2017 CLINICAL DATA:  Chronic low back pain beginning last night.  Fall. EXAM: LUMBAR SPINE - COMPLETE 4+ VIEW COMPARISON:  CT abdomen and pelvis 06/15/2016. FINDINGS: Five non rib-bearing lumbar type vertebral bodies  are present. Levoconvex curvature of the lumbar spine is centered at L2-3. Remote superior endplate fracture is again noted at L1. Endplate sclerotic changes are noted on the right at L 2-3. There is slight retrolisthesis at L2-3. No acute fracture traumatic subluxation is present. IMPRESSION: 1. No acute abnormality. 2. 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 3. Remote superior endplate L1 fracture. Electronically Signed   By: Marin Roberts M.D.   On: 05/15/2017 14:20   Mr Lumbar Spine W Wo Contrast  Result Date: 05/17/2017 CLINICAL DATA:  Low back pain for 3-4 days. No known injury. The patient suffered a fall down steps 05/14/2017. EXAM: MRI LUMBAR SPINE WITHOUT AND WITH CONTRAST TECHNIQUE: Multiplanar and multiecho pulse sequences of the lumbar spine were obtained without and with intravenous contrast. CONTRAST:  15 ml MULTIHANCE GADOBENATE DIMEGLUMINE 529 MG/ML IV SOLN COMPARISON:  CT abdomen and pelvis 06/15/2016 and 02/02/2016. CT lumbar spine 05/17/2017. FINDINGS: Segmentation:  Standard. Alignment:  Maintained. Vertebrae: Mild, chronic anterior superior endplate compression fracture of L1 is unchanged. There is marrow edema and enhancement throughout the L2 and L3 vertebral bodies. Degenerative endplate signal change is seen at L5-S1. Conus medullaris and cauda equina: Conus extends to the L1 level. Conus and cauda equina appear normal. Paraspinal and other soft tissues: Negative. Disc levels: T10-11 and T11-12 are imaged in the sagittal plane only. Disc bulging is seen at both levels but the central canal and foramina appear open. T12-L1: Negative. L1-2: Shallow disc bulge without central canal or foraminal stenosis. L2-3: There is a shallow disc bulge without central canal or foraminal stenosis. There is paraspinous inflammatory change centered about the disc interspace. No abscess is identified. There is no fluid in the disc interspace. L3-4:  Shallow disc bulge and a small left foraminal protrusion. The central canal and right foramen are widely patent. Mild left foraminal narrowing is noted. L4-5: Shallow disc bulge and mild-to-moderate facet degenerative change without central canal or foraminal stenosis. L5-S1: Minimal disc bulge and a small left foraminal protrusion. There is mild left foraminal narrowing. The central canal and right foramen are widely patent. IMPRESSION: Edema and enhancement throughout the L2 and L3 vertebral bodies with surrounding paraspinous inflammatory change most consistent with discitis and osteomyelitis. Negative for abscess or central canal compromise. Chronic L1 anterior, superior endplate compression fracture. Mild degenerative disc disease without central canal stenosis as described above. Electronically Signed   By: Maisie Fus  Dalessio M.D.   On: 05/17/2017 07:35   Ct L-spine No Charge  Addendum Date: 05/17/2017   ADDENDUM REPORT: 05/17/2017 05:25 ADDENDUM: Note findings and recommendations on concurrent CT of abdomen and pelvis indicating haziness of prevertebral soft tissues at the L2-3 level. Electronically Signed   By: Mitzi Hansen M.D.   On: 05/17/2017 05:25   Result Date: 05/17/2017 CLINICAL DATA:  42 y/o M; lower back pain starting 3-4 days ago. Recent fall. EXAM: CT LUMBAR SPINE WITHOUT CONTRAST TECHNIQUE: Multidetector CT imaging of the lumbar spine was performed without intravenous contrast administration. Multiplanar CT image reconstructions were also generated. COMPARISON:  Concurrent and 01/05/2017 CT abdomen and pelvis. FINDINGS: Segmentation: 5 lumbar type vertebrae. Alignment: Mild lumbar levocurvature with apex at L3. Normal lumbar lordosis without listhesis. Vertebrae: Stable mild anterior compression deformity of L1 vertebral body. Stable severe discogenic degenerative changes of L2-3 opposing endplates. No acute fracture or new loss of vertebral body height. Paraspinal and other soft  tissues: Please refer to concurrent CT of the abdomen and pelvis. Disc levels: Moderate L2-3 loss of intervertebral disc space height and multilevel mild loss of intervertebral disc space height. Disc bulges and endplate marginal osteophytes result in mild foraminal stenosis bilaterally at L1 through L4 and left L4-5. There is moderate stenosis at the left L5-S1 neural foramen. No high-grade bony canal stenosis. IMPRESSION: 1. No acute fracture or malalignment. 2. Stable chronic L1 mild anterior compression deformity. 3. Lumbar spondylosis with discogenic degenerative changes greatest at the L2-3 level are stable. Moderate left L5-S1 neural foraminal stenosis. Multilevel mild neural foraminal stenosis. No high-grade bony canal stenosis. Electronically Signed: By: Mitzi Hansen M.D. On: 05/17/2017 03:51   Ct Renal Stone Study  Result Date: 05/17/2017 CLINICAL DATA:  42 year old male with flank pain. Concern for renal stone. EXAM: CT ABDOMEN AND PELVIS WITHOUT CONTRAST TECHNIQUE: Multidetector CT imaging of the abdomen and pelvis was performed following the standard protocol without IV contrast. COMPARISON:  Lumbar spine radiograph dated 05/15/2017 and CT of the abdomen pelvis dated 01/05/2017 FINDINGS: Evaluation of this exam is limited in the absence of intravenous contrast. Lower chest: There is mild cardiomegaly. There is a 17 mm left lower lobe subpleural nodule as seen on the prior CT follow-up as recommended on the report of the prior CT. No intra-abdominal free air or free fluid. Hepatobiliary: No focal liver abnormality is seen. No gallstones, gallbladder wall thickening, or biliary dilatation. Pancreas: Unremarkable. No pancreatic ductal dilatation or surrounding inflammatory changes. Spleen: Top-normal spleen size. Stable surgical clips in the inferior spleen. Adrenals/Urinary Tract: The adrenal glands are unremarkable. There is a nonobstructing stone in the interpolar aspect of the right  kidney measuring 7 mm. There is no hydronephrosis. A 3 mm nonobstructing left renal upper pole calculus and probable additional punctate nonobstructing stones noted. There is no hydronephrosis on either side. Subcentimeter left renal hypodense lesion is not characterized. The visualized ureters and urinary bladder appear unremarkable. Stomach/Bowel: There is no bowel obstruction or active inflammation. The appendix is normal. Vascular/Lymphatic: The abdominal aorta and IVC are grossly unremarkable on this noncontrast CT. No portal venous gas. There is no adenopathy. Reproductive: The prostate and seminal vesicles are grossly unremarkable. No pelvic mass. Other: None Musculoskeletal: There is old compression fracture of the superior endplate of L1 with approximately 40% loss of vertebral body height anteriorly. There is sclerotic changes of the inferior L2 and superior L3 with areas of endplate irregularity at L2-L3. There is haziness of the perivertebral soft tissues primarily  at L2-L3. The perivertebral soft tissue haziness appears more pronounced or new compared to the prior CT. An infectious process involving the spine and disc at this level is not excluded. Further evaluation with MRI without and with contrast is advised. There is no acute fracture. IMPRESSION: 1. There is sclerotic changes primarily at L2-L3 with irregularity of the endplate at this level. There is associated haziness of the perivertebral soft tissues, new or more prominent compared to the prior CT. Findings concerning for an infectious process/spondylo discitis. Further evaluation with MRI without and with contrast is recommended. 2. Degenerative changes of the spine with old L1 superior endplate compression fracture. No acute fracture or subluxation. 3. Bilateral nonobstructing renal calculi measure up to 7 mm in the interpolar aspect of the right kidney. No hydronephrosis. Electronically Signed   By: Elgie Collard M.D.   On: 05/17/2017  04:59      Subjective: Only reports of back pain upon.  No other complaints.    HEENT/EYES = negative for pain, redness, loss of vision, double vision, blurred vision, loss of hearing, sore throat, hoarseness, dysphagia Cardiovascular= negative for chest pain, palpitation, murmurs, lower extremity swelling Respiratory/lungs= negative for shortness of breath, cough, hemoptysis, wheezing, mucus production Gastrointestinal= negative for nausea, vomiting,, abdominal pain, melena, hematemesis Genitourinary= negative for Dysuria, Hematuria, Change in Urinary Frequency MSK = Negative for arthralgia, myalgias, Joint swelling  Neurology= Negative for headache, seizures, numbness, tingling  Psychiatry= Negative for anxiety, depression, suicidal and homocidal ideation Allergy/Immunology= Medication/Food allergy as listed  Skin= Negative for Rash, lesions, ulcers, itching   Discharge Exam: Vitals:   05/26/17 0500 05/26/17 0803  BP: 140/88 (!) 160/96  Pulse: 65 66  Resp: 18 18  Temp: 97.9 F (36.6 C) 98.1 F (36.7 C)  SpO2: 99% 99%   Vitals:   05/25/17 1328 05/25/17 2237 05/26/17 0500 05/26/17 0803  BP: 122/86 (!) 155/103 140/88 (!) 160/96  Pulse: 78 76 65 66  Resp: 20 18 18 18   Temp: 97.9 F (36.6 C) 99.4 F (37.4 C) 97.9 F (36.6 C) 98.1 F (36.7 C)  TempSrc: Oral Oral Oral Oral  SpO2: 100% 100% 99% 99%  Weight:      Height:        General: Pt is alert, awake, not in acute distress Cardiovascular: RRR, S1/S2 +, no rubs, no gallops Respiratory: CTA bilaterally, no wheezing, no rhonchi Abdominal: Soft, NT, ND, bowel sounds + Extremities: no edema, no cyanosis    The results of significant diagnostics from this hospitalization (including imaging, microbiology, ancillary and laboratory) are listed below for reference.     Microbiology: Recent Results (from the past 240 hour(s))  Blood culture (routine x 2)     Status: Abnormal   Collection Time: 05/17/17  8:30 AM   Result Value Ref Range Status   Specimen Description BLOOD RIGHT HAND  Final   Special Requests   Final    BOTTLES DRAWN AEROBIC AND ANAEROBIC Blood Culture adequate volume   Culture  Setup Time   Final    GRAM POSITIVE COCCI Gram Stain Report Called to,Read Back By and Verified With: NURSE ODUNTAN NOTIFIED AT 0328 ON 05/18/2017 BY EVA IN BOTH AEROBIC AND ANAEROBIC BOTTLES Performed at [facility] AEROBIC AND ANAEROBIC BOTTLES    Culture VIRIDANS STREPTOCOCCUS (A)  Final   Report Status 05/20/2017 FINAL  Final   Organism ID, Bacteria VIRIDANS STREPTOCOCCUS  Final      Susceptibility   Viridans streptococcus - MIC*    PENICILLIN <=  0.06 SENSITIVE Sensitive     CEFTRIAXONE 0.5 SENSITIVE Sensitive     ERYTHROMYCIN 2 RESISTANT Resistant     LEVOFLOXACIN >=16 RESISTANT Resistant     VANCOMYCIN 0.5 SENSITIVE Sensitive     * VIRIDANS STREPTOCOCCUS  Blood Culture ID Panel (Reflexed)     Status: Abnormal   Collection Time: 05/17/17  8:30 AM  Result Value Ref Range Status   Enterococcus species NOT DETECTED NOT DETECTED Final   Listeria monocytogenes NOT DETECTED NOT DETECTED Final   Staphylococcus species NOT DETECTED NOT DETECTED Final   Staphylococcus aureus NOT DETECTED NOT DETECTED Final   Streptococcus species DETECTED (A) NOT DETECTED Final    Comment: Not Enterococcus species, Streptococcus agalactiae, Streptococcus pyogenes, or Streptococcus pneumoniae. CRITICAL RESULT CALLED TO, READ BACK BY AND VERIFIED WITH: Patrick North PharmD 10:00 05/18/17 ([email or web address])    Streptococcus agalactiae NOT DETECTED NOT DETECTED Final   Streptococcus pneumoniae NOT DETECTED NOT DETECTED Final   Streptococcus pyogenes NOT DETECTED NOT DETECTED Final   Acinetobacter baumannii NOT DETECTED NOT DETECTED Final   Enterobacteriaceae species NOT DETECTED NOT DETECTED Final   Enterobacter cloacae complex NOT DETECTED NOT DETECTED Final   Escherichia coli NOT DETECTED NOT DETECTED Final   Klebsiella  oxytoca NOT DETECTED NOT DETECTED Final   Klebsiella pneumoniae NOT DETECTED NOT DETECTED Final   Proteus species NOT DETECTED NOT DETECTED Final   Serratia marcescens NOT DETECTED NOT DETECTED Final   Haemophilus influenzae NOT DETECTED NOT DETECTED Final   Neisseria meningitidis NOT DETECTED NOT DETECTED Final   Pseudomonas aeruginosa NOT DETECTED NOT DETECTED Final   Candida albicans NOT DETECTED NOT DETECTED Final   Candida glabrata NOT DETECTED NOT DETECTED Final   Candida krusei NOT DETECTED NOT DETECTED Final   Candida parapsilosis NOT DETECTED NOT DETECTED Final   Candida tropicalis NOT DETECTED NOT DETECTED Final  Blood culture (routine x 2)     Status: Abnormal   Collection Time: 05/17/17  8:42 AM  Result Value Ref Range Status   Specimen Description   Final    BLOOD LEFT ARM DRAWN BY RN Performed at [facility], [address], [place], [place] [address]    Special Requests   Final    BOTTLES DRAWN AEROBIC AND ANAEROBIC Blood Culture adequate volume Performed at [facility], [address], [place], [place] [address]    Culture  Setup Time   Final    GRAM POSITIVE COCCI Gram Stain Report Called to,Read Back By and Verified With: NURSE ODUNTAN AT 0441 ON 05/18/2017 BY EVA ANAEROBIC BOTTLE ONLY Performed at [facility] IN BOTH AEROBIC AND ANAEROBIC BOTTLES    Culture (A)  Final    VIRIDANS STREPTOCOCCUS SUSCEPTIBILITIES PERFORMED ON PREVIOUS CULTURE WITHIN THE LAST 5 DAYS. Performed at [facility], [address], [place], [place] [address]    Report Status 05/20/2017 FINAL  Final  MRSA PCR Screening     Status: Abnormal   Collection Time: 05/18/17  6:37 AM  Result Value Ref Range Status   MRSA by PCR POSITIVE (A) NEGATIVE Final    Comment:        The GeneXpert MRSA Assay (FDA approved for NASAL specimens only), is one component of a comprehensive MRSA colonization surveillance program. It is not intended to diagnose MRSA infection nor to guide  or monitor treatment for MRSA infections. RESULT CALLED TO, READ BACK BY AND VERIFIED WITH: RN Milbert Coulter [id number] [phone] MLM Performed at [facility], [address]  [place], [place] [address]   Culture, blood (routine x 2)     Status: None   Collection Time: 05/19/17  9:50 AM  Result Value Ref Range Status   Specimen Description BLOOD LEFT ANTECUBITAL  Final   Special Requests   Final    BOTTLES DRAWN AEROBIC ONLY Blood Culture adequate volume   Culture   Final    NO GROWTH 5 DAYS Performed at [facility], [address], [place], [place] [address]    Report Status 05/24/2017 FINAL  Final  Culture, blood (routine x 2)     Status: None   Collection Time: 05/19/17  9:55 AM  Result Value Ref Range Status   Specimen Description BLOOD RIGHT HAND  Final   Special Requests   Final    BOTTLES DRAWN AEROBIC ONLY Blood Culture adequate volume   Culture   Final    NO GROWTH 5 DAYS Performed at [facility], [address] [place], [place] [address]    Report Status 05/24/2017 FINAL  Final     Labs: BNP (last 3 results) No results for input(s): BNP in the last 8760 hours. Basic Metabolic Panel: Recent Labs  Lab 05/21/17 0421 05/23/17 0802 05/26/17 0340  NA 136 135 135  K 4.1 3.9 4.6  CL 102 98* 98*  CO2 GLUCOSE 173* 147* 236*  BUN CREATININE 0.70 0.85 0.89  CALCIUM 8.5* 8.9 9.5  MG 1.6* 2.0 2.0   Liver Function Tests: Recent Labs  Lab 05/21/17 0421  AST 34  ALT 51  ALKPHOS 48  BILITOT 0.5  PROT 6.1*  ALBUMIN 2.3*   No results for input(s): LIPASE, AMYLASE in the last 168 hours. No results for input(s): AMMONIA in the last 168 hours. CBC: Recent Labs  Lab 05/21/17 0421 05/23/17 0802 05/26/17 0340  WBC 5.2 6.0 6.1  NEUTROABS 3.5  --   --   HGB 11.8* 12.2* 13.6  HCT 35.9* 38.1* 42.0  MCV 94.5 95.7 95.7  PLT 157 211 261   Cardiac Enzymes: No results for input(s): CKTOTAL, CKMB, CKMBINDEX, TROPONINI in the last 168  hours. BNP: Invalid input(s): POCBNP CBG: No results for input(s): GLUCAP in the last 168 hours. D-Dimer No results for input(s): DDIMER in the last 72 hours. Hgb A1c No results for input(s): HGBA1C in the last 72 hours. Lipid Profile No results for input(s): CHOL, HDL, LDLCALC, TRIG, CHOLHDL, LDLDIRECT in the last 72 hours. Thyroid function studies No results for input(s): TSH, T4TOTAL, T3FREE, THYROIDAB in the last 72 hours.  Invalid input(s): FREET3 Anemia work up No results for input(s): VITAMINB12, FOLATE, FERRITIN, TIBC, IRON, RETICCTPCT in the last 72 hours. Urinalysis    Component Value Date/Time   COLORURINE YELLOW 05/17/2017 0300   APPEARANCEUR HAZY (A) 05/17/2017 0300   APPEARANCEUR Hazy 09/28/2013 1707   LABSPEC 1.012 05/17/2017 0300   LABSPEC 1.017 09/28/2013 1707   PHURINE 6.0 05/17/2017 0300   GLUCOSEU NEGATIVE 05/17/2017 0300   GLUCOSEU Negative 09/28/2013 1707   HGBUR MODERATE (A) 05/17/2017 0300   BILIRUBINUR NEGATIVE 05/17/2017 0300   BILIRUBINUR Negative 09/28/2013 1707   KETONESUR NEGATIVE 05/17/2017 0300   PROTEINUR 100 (A) 05/17/2017 0300   UROBILINOGEN 0.2 03/24/2014 1341   NITRITE NEGATIVE 05/17/2017 0300   LEUKOCYTESUR SMALL (A) 05/17/2017 0300   LEUKOCYTESUR Negative 09/28/2013 1707   Sepsis Labs Invalid input(s): PROCALCITONIN,  WBC,  LACTICIDVEN Microbiology Recent Results (from the past 240 hour(s))  Blood culture (  routine x 2)     Status: Abnormal   Collection Time: 05/17/17  8:30 AM  Result Value Ref Range Status   Specimen Description BLOOD RIGHT HAND  Final   Special Requests   Final    BOTTLES DRAWN AEROBIC AND ANAEROBIC Blood Culture adequate volume   Culture  Setup Time   Final    GRAM POSITIVE COCCI Gram Stain Report Called to,Read Back By and Verified With: NURSE ODUNTAN NOTIFIED AT 0328 ON 05/18/2017 BY EVA IN BOTH AEROBIC AND ANAEROBIC BOTTLES Performed at [facility] AEROBIC AND ANAEROBIC BOTTLES    Culture  VIRIDANS STREPTOCOCCUS (A)  Final   Report Status 05/20/2017 FINAL  Final   Organism ID, Bacteria VIRIDANS STREPTOCOCCUS  Final      Susceptibility   Viridans streptococcus - MIC*    PENICILLIN <=0.06 SENSITIVE Sensitive     CEFTRIAXONE 0.5 SENSITIVE Sensitive     ERYTHROMYCIN 2 RESISTANT Resistant     LEVOFLOXACIN >=16 RESISTANT Resistant     VANCOMYCIN 0.5 SENSITIVE Sensitive     * VIRIDANS STREPTOCOCCUS  Blood Culture ID Panel (Reflexed)     Status: Abnormal   Collection Time: 05/17/17  8:30 AM  Result Value Ref Range Status   Enterococcus species NOT DETECTED NOT DETECTED Final   Listeria monocytogenes NOT DETECTED NOT DETECTED Final   Staphylococcus species NOT DETECTED NOT DETECTED Final   Staphylococcus aureus NOT DETECTED NOT DETECTED Final   Streptococcus species DETECTED (A) NOT DETECTED Final    Comment: Not Enterococcus species, Streptococcus agalactiae, Streptococcus pyogenes, or Streptococcus pneumoniae. CRITICAL RESULT CALLED TO, READ BACK BY AND VERIFIED WITH: Patrick North PharmD 10:00 05/18/17 ([email or web address])    Streptococcus agalactiae NOT DETECTED NOT DETECTED Final   Streptococcus pneumoniae NOT DETECTED NOT DETECTED Final   Streptococcus pyogenes NOT DETECTED NOT DETECTED Final   Acinetobacter baumannii NOT DETECTED NOT DETECTED Final   Enterobacteriaceae species NOT DETECTED NOT DETECTED Final   Enterobacter cloacae complex NOT DETECTED NOT DETECTED Final   Escherichia coli NOT DETECTED NOT DETECTED Final   Klebsiella oxytoca NOT DETECTED NOT DETECTED Final   Klebsiella pneumoniae NOT DETECTED NOT DETECTED Final   Proteus species NOT DETECTED NOT DETECTED Final   Serratia marcescens NOT DETECTED NOT DETECTED Final   Haemophilus influenzae NOT DETECTED NOT DETECTED Final   Neisseria meningitidis NOT DETECTED NOT DETECTED Final   Pseudomonas aeruginosa NOT DETECTED NOT DETECTED Final   Candida albicans NOT DETECTED NOT DETECTED Final   Candida glabrata NOT DETECTED  NOT DETECTED Final   Candida krusei NOT DETECTED NOT DETECTED Final   Candida parapsilosis NOT DETECTED NOT DETECTED Final   Candida tropicalis NOT DETECTED NOT DETECTED Final  Blood culture (routine x 2)     Status: Abnormal   Collection Time: 05/17/17  8:42 AM  Result Value Ref Range Status   Specimen Description   Final    BLOOD LEFT ARM DRAWN BY RN Performed at [facility], [address], [place], [place] [address]    Special Requests   Final    BOTTLES DRAWN AEROBIC AND ANAEROBIC Blood Culture adequate volume Performed at [facility], [address], [place], [place] [address]    Culture  Setup Time   Final    GRAM POSITIVE COCCI Gram Stain Report Called to,Read Back By and Verified With: NURSE ODUNTAN AT 0441 ON 05/18/2017 BY EVA ANAEROBIC BOTTLE ONLY Performed at [facility] IN BOTH AEROBIC AND ANAEROBIC BOTTLES    Culture (A)  Final  VIRIDANS STREPTOCOCCUS SUSCEPTIBILITIES PERFORMED ON PREVIOUS CULTURE WITHIN THE LAST 5 DAYS. Performed at [facility], [address] [place], [place] [address]    Report Status 05/20/2017 FINAL  Final  MRSA PCR Screening     Status: Abnormal   Collection Time: 05/18/17  6:37 AM  Result Value Ref Range Status   MRSA by PCR POSITIVE (A) NEGATIVE Final    Comment:        The GeneXpert MRSA Assay (FDA approved for NASAL specimens only), is one component of a comprehensive MRSA colonization surveillance program. It is not intended to diagnose MRSA infection nor to guide or monitor treatment for MRSA infections. RESULT CALLED TO, READ BACK BY AND VERIFIED WITH: RN Milbert Coulter [id number] [phone] MLM Performed at [facility], [address] [place], [place] [address]   Culture, blood (routine x 2)     Status: None   Collection Time: 05/19/17  9:50 AM  Result Value Ref Range Status   Specimen Description BLOOD LEFT ANTECUBITAL  Final   Special Requests   Final    BOTTLES DRAWN AEROBIC ONLY Blood Culture adequate volume    Culture   Final    NO GROWTH 5 DAYS Performed at [facility], [address] [place], [place] [address]    Report Status 05/24/2017 FINAL  Final  Culture, blood (routine x 2)     Status: None   Collection Time: 05/19/17  9:55 AM  Result Value Ref Range Status   Specimen Description BLOOD RIGHT HAND  Final   Special Requests   Final    BOTTLES DRAWN AEROBIC ONLY Blood Culture adequate volume   Culture   Final    NO GROWTH 5 DAYS Performed at [facility], [address] [place], [place] [address]    Report Status 05/24/2017 FINAL  Final     Time coordinating discharge:  I have spent 35 minutes face to face with the patient and on the ward discussing the patients care, assessment, plan and disposition with other care givers. >50% of the time was devoted counseling the patient about the risks and benefits of treatment/Discharge disposition and coordinating care.   SIGNED:   Dimple Nanas, MD  [facility] 05/26/2017, 12:01 PM Pager   If 7PM-7AM, please contact night-coverage [email or web address] Password [email or web address]

## 2017-05-26 NOTE — Progress Notes (Signed)
Joshua Thomas was discharged from [phone] via wheelchair.  The patient was given paper scrubs to wear home, as he did not have any clothes with him.  The patient noted that he was in pain, but toradol was given IV before IV was removed per physician order.  Of note, patient refused to sign the discharge summary paper.  All personal belongings were accounted for.  Pt denied having any further questions.

## 2017-05-31 ENCOUNTER — Ambulatory Visit (INDEPENDENT_AMBULATORY_CARE_PROVIDER_SITE_OTHER): Payer: Self-pay | Admitting: Internal Medicine

## 2017-05-31 VITALS — BP 143/87 | HR 100 | Temp 97.4°F | Resp 20 | Wt 153.6 lb

## 2017-05-31 DIAGNOSIS — R739 Hyperglycemia, unspecified: Secondary | ICD-10-CM

## 2017-05-31 DIAGNOSIS — B182 Chronic viral hepatitis C: Secondary | ICD-10-CM

## 2017-05-31 DIAGNOSIS — R7303 Prediabetes: Secondary | ICD-10-CM

## 2017-05-31 DIAGNOSIS — F1490 Cocaine use, unspecified, uncomplicated: Secondary | ICD-10-CM

## 2017-05-31 DIAGNOSIS — F1120 Opioid dependence, uncomplicated: Secondary | ICD-10-CM

## 2017-05-31 LAB — POCT GLYCOSYLATED HEMOGLOBIN (HGB A1C): HEMOGLOBIN A1C: 6.4 % — AB (ref 4.0–5.6)

## 2017-05-31 LAB — GLUCOSE, CAPILLARY: GLUCOSE-CAPILLARY: 113 mg/dL — AB (ref 65–99)

## 2017-05-31 MED ORDER — BUPRENORPHINE HCL-NALOXONE HCL 8-2 MG SL SUBL: 1.0000 | SUBLINGUAL_TABLET | Freq: Two times a day (BID) | SUBLINGUAL | 0 refills | Status: DC

## 2017-05-31 MED FILL — BUPRENORPHINE HCL-NALOXONE: 8-2 | 7 days supply | Qty: 14 | Fill #0

## 2017-05-31 NOTE — Progress Notes (Signed)
05/31/2017  Joshua Thomas presents for buprenorphine/naloxone intake visit.  He was recently admitted to the hospital for severe back pain work-up revealed bacteremia, discitis.  He was treated with IV antibiotics and eventually transition to an oral regimen and discharge.  During his hospitalization I was asked to see him for Suboxone therapy I did evaluate him discussed Suboxone with him however he was not interested in the hospital due to increased pain.  He was discharged late last week.  I did give him my card and instructed that he could follow-up in our Tuesday morning clinic.  Unfortunately he lost the card but presented to the hospital and was directed down to our clinic.  He reports that since getting out he has used his oxycodone intermittently he has used cocaine intranasally he is not used any IV heroin.  He is interested in medication assisted treatment for his opioid use disorder.  I have reviewed EPIC data including labwork which was available.  I have reviewed outside records provided by patient if available.  The salient points were confirmed with the patient.    Review of substance use history (first use, substances used, any illicit purchases):  Cocaine, first used at age 1, last use after discharge 5/17 (1/2 gram) Heroine, first use 5-6 years ago, before hospitalization, (1/2 gram at a time) Xanax- illicent purchases, 5/18  Last substance used: cocaine 5/17, herion 5/6.  Mental Health History: Bipolar disorder- hospitalized at 42 years of age  Current counseling/behavioural health provider: psychiatry at [facility] (cannot remember Dr name) last saw 2-3 months ago.  This patient has Opioid Use Disorder by following DSM-V criteria: Keep those that apply.  - Opioids taken in larger amounts or over a longer period than intended  - Persistent desire to cut down - A great deal of time is spent to obtain/use/recover from the opioid - Cravings to use opioids - Use resulting in a  failure to fulfill major role obligations- kids daughter and 2 sons - Continue opioid use despite persistent social or interpersonal problems - Important activities are given up or reduced because of opioid use - Recurrent opioid use in situations in which it is physically hazardous - Use despite knowledge of health problems caused by opioids - Withdrawal  Past Medical History:  Diagnosis Date  . ADHD (attention deficit hyperactivity disorder)   . Bipolar 1 disorder (HCC)   . Chronic back pain   . Chronic pain    chronic l leg pain  . DDD (degenerative disc disease), lumbar   . Degenerative disc disease, lumbar   . Depression   . GERD (gastroesophageal reflux disease)   . Hepatitis C   . Hepatitis C   . History of stomach ulcers   . Sciatica   . Substance abuse (HCC)     Current Outpatient Medications on File Prior to Visit  Medication Sig Dispense Refill  . amoxicillin (AMOXIL) 500 MG capsule Take 1 capsule (500 mg total) by mouth 3 (three) times daily. 135 capsule 0  . gabapentin (NEURONTIN) 300 MG capsule Take 1 capsule (300 mg total) by mouth 3 (three) times daily. 90 capsule 0  . methocarbamol (ROBAXIN) 500 MG tablet Take 1 tablet (500 mg total) by mouth 3 (three) times daily for 7 days. 21 tablet 0  . naproxen (NAPROSYN) 500 MG tablet Take 1 tablet (500 mg total) by mouth 3 (three) times daily with meals for 7 days. 21 tablet 0  . nicotine (NICODERM CQ - DOSED IN  MG/24 HOURS) 21 mg/24hr patch Place 1 patch (21 mg total) onto the skin daily for 28 days. 28 patch 0  . oxyCODONE 10 MG TABS Take 1 tablet (10 mg total) by mouth every 6 (six) hours as needed for up to 5 days for severe pain or breakthrough pain. 30 tablet 0  . pantoprazole (PROTONIX) 40 MG tablet Take 1 tablet (40 mg total) by mouth daily. 30 tablet 0  . polyethylene glycol (MIRALAX / GLYCOLAX) packet Take 17 g by mouth daily as needed for up to 15 doses for severe constipation. 14 each 0  . senna-docusate  (SENOKOT-S) 8.6-50 MG tablet Take 1 tablet by mouth at bedtime as needed for up to 15 doses for mild constipation. 15 tablet 0  . traZODone (DESYREL) 50 MG tablet Take 1 tablet (50 mg total) by mouth at bedtime as needed for up to 15 doses for sleep. 15 tablet 0   No current facility-administered medications on file prior to visit.     Physical Exam  Vitals:   06/01/17 0937  BP: (!) 143/87  Pulse: 100  Resp: 20  Temp: (!) 97.4 F (36.3 C)  TempSrc: Oral  SpO2: 100%  Weight: 153 lb 9.6 oz (69.7 kg)    Clinical Opiate Withdrawal Scale: bold applicable COWS scoring   - Resting HR:    - 0 for < 80   - 1 for 81 - 100   - 2 for 101 - 120   - 4 for > 120  - Sweating:   - 0 for no chills/flushing   - 1 for subjective chills/flushing   - 3 for beads of sweat on brow/face   - 4 for sweat streaming off of face  - Restlessness:    - 0 for able to sit still   - 1 for subjective difficulty sitting still   - 3 for frequent shifting or extraneous movement   - 5 for unable to sit still for more than a few seconds  - Pupil size:    - 0 for pinpoint or normal   - 1 for possibly larger than normal   - 2 for moderately dilated   - 5 for only iris rim visible  - Bone/joint pain:    - 0 for not present   - 1 for mild diffuse discomfort   - 2 severe diffuse aching   - 4 for objectively rubbing joints/muscles and obviously in pain  - Runny nose/tearing:    - 0 for not present   - 1 for stuffy nose/moist eyes   - 2 for nose running/tearing   - 4 for nose constantly running or tears streaming down cheeks  - GI Upset:    - 0 for no GI symptoms   - 1 for stomach cramps   - 2 for nausea or loose stool   - 3 for vomiting or diarrhea   - 5 for multiple episodes of vomiting or diarrhea  - Tremor observation of outstretched hands:    - 0 for no tremor   - 1 for tremor can be felt but not observed   - 2 for slight tremor observable   - 4 for gross tremor or muscle twitching  - Yawning:     - 0 for no yawning   - 1 for yawning once or twice during assessment   - 2 for yawning three or more times during assessment   - 4 for yawning several times per minute  -  Anxiety or irritability:    - 0 for none   - 1 for patient reports increasing irritability or anxiousness   - 2 for patient obviously irritable/anxious   - 4 for patient so irritable/anxious that assessment is difficult  - Gooseflesh:    - 0 for skin is smooth   - 3 for piloerection of skin can be felt or seen   - 5 for prominent piloerection  TOTAL: 10  Assessment/Plan:   Based on a review of the patient's medical history including substance use and mental health factors, and physical exam, Joshua Thomas is a suitable candidate for MAT with buprenorphine/naloxone.  UDS ordered this visit.    I have discussed HIV and Hepatitis C screening with this patient.  Already completed 5/8, HIV negative, Hep C positive  I will place orders for CMET for baseline LFT evaluation if needed.   See A/P notes for additional details.  Home Induction:   I have instructed the patient how to appropriately take this medication, including placing under the tongue with head relaxed for 10 minutes and allowing to dissolve without chewing or swallowing tab/film, and with nothing to eat or drink in the subsequent 15 minutes.  They have been told not to start taking the medication until they have significant signs of withdrawal and I have explained the concept of precipitated withdrawal with the patient.    Intervisit Care:  I will call the patient the morning after induction to assess symptom burden and determine the need for additional dose titration.  We discussed this medication must be kept in a safe place and away from children.   We will see the patient back in 1 week in clinic, with options for a sooner appointment based on patient and provider preference.   Patient was encouraged to call the office and speak with the MD on call  for any urgent concerns.    Gust Rung, DO 05/31/2017 10:54 AM

## 2017-05-31 NOTE — Patient Instructions (Signed)

## 2017-06-01 ENCOUNTER — Inpatient Hospital Stay: Payer: Self-pay

## 2017-06-01 ENCOUNTER — Encounter: Payer: Self-pay | Admitting: Internal Medicine

## 2017-06-01 NOTE — Progress Notes (Deleted)
Patient ID: Joshua Thomas, male   DOB: 1975-11-02, 42 y.o.   MRN: [id number]   After hospitalization 57-5/16/2019 for osteomyelitis of lumbar spine.    Brief/Interim Summary: 42 year old with a history of bipolar disorder, chronic back pain, hepatitis C, tobacco use, polysubstance abuse disorder came to the hospital on 5/7 with complaints of back pain. Upon evaluation he was found to have acute discitis with osteomyelitis of L2-L3. Blood cultures were positive for strep viridans and MRI confirmed osteomyelitis as well. Initially interventional radiology thought he was at high risk for regular IR guided biopsy but due to already positive blood cultures infectious disease recommended holding off biopsy. Echocardiogram was negative for any vegetation. HIV was negative. Infectious disease was consulted and patient has been on IV Rocephin with plans of continuing it for 6 weeks and later transitioning to amoxicillin 500 mg 3 times daily. His hospital stay has been complicated by issues with chronic back pain and pain control. I spoke with Dr. Mikey Bussing from Suboxone clinic will be going to see him on upcoming Tuesday on 05/31/17.  Patient is aware of this and he he will follow-up.  He is also very comes to follow-up in the community wellness center and infectious disease.    Discharge Diagnoses:  Principal Problem:   Acute osteomyelitis of lumbar spine (HCC) Active Problems:   Hepatitis C   Opioid use disorder, moderate, dependence (HCC)   GERD (gastroesophageal reflux disease)   Bipolar disorder (HCC)   Discitis   Streptococcal bacteremia   Back pain secondary to acute discitis/osteomyelitis L2-L3;improving Streptococcus viridans bacteremia, improved - MRI consistent with osteomyelitis. IR biopsy not done as it was not thought to be high risk and his blood cultures are already positive. Echocardiogram was negative for any vegetation. Infectious disease recommended IV Rocephin and later  switching to amoxicillin at the time of discharge -amoxicillin 500 mg 3 times daily for total of 6 weeks. -Appreciate infectious disease input- will need to follow up outpatient. -Due to history of drug abuse I recommended he seek outpatient rehabilitation.  Chronic back pain -Previously has been heavily using cocaine and heroin. Currently doing on Oxycodone, Robaxin, Gabapentin and NSAIDs -I have spoken with Dr. Mikey Bussing, is willing to see the patient in his clinic on 5/21/19for buprenorphine induction. At this time patient is reluctant to go undergo this induction and experience withdrawal symptoms but I have extensively explained to him that he has limited options at this time given lack of insurance and unable to afford extra out of pocket cost. -On bowel regimen -On trazodone for insomnia  History of depression -On Cymbaltaand Seroquel.  Lovenox subcu while he was here for DVT prophylaxis Full code  Due to patient's history of noncompliance and IV drug abuse he is at high risk for readmission.     From hospital d/c plan:  1. Follow up with PCP in 1 weeks-community wellness center information given 2. Please obtain BMP/CBC in one week your next doctors visit.  3. Follow-up with infectious disease in 2-4 weeks 4. Follow-up at Suboxone clinic for induction on Tuesday 05/31/17. Dr Clemencia Course aware of this patient.  5. Patient has been given the following prescriptions- amoxicillin 500 mg to be taken 3 times daily for 45 days, gabapentin 30 mg to be taken 3 times daily, Robaxin 500 mg to be taken 3 times daily for 7 days, naproxen 500 mg to be taken 3 times daily with meals for 7 days, nicotine patch 21 mg every 24 hours, oxycodone 10  mg to be taken every 6 hours for severe breakthrough pain for up to 5 days, MiraLAX, senna, trazodone 50 mg to be taken at bedtime to help sleep-15 doses

## 2017-06-03 ENCOUNTER — Emergency Department (HOSPITAL_COMMUNITY)
Admission: EM | Admit: 2017-06-03 | Discharge: 2017-06-03 | Discharge status: Against Medical Advice | Payer: Self-pay | Attending: Emergency Medicine | Admitting: Emergency Medicine

## 2017-06-03 ENCOUNTER — Encounter (HOSPITAL_COMMUNITY): Payer: Self-pay | Admitting: Emergency Medicine

## 2017-06-03 ENCOUNTER — Other Ambulatory Visit: Payer: Self-pay

## 2017-06-03 ENCOUNTER — Emergency Department (HOSPITAL_COMMUNITY): Payer: Self-pay

## 2017-06-03 DIAGNOSIS — M4646 Discitis, unspecified, lumbar region: Secondary | ICD-10-CM

## 2017-06-03 DIAGNOSIS — M4626 Osteomyelitis of vertebra, lumbar region: Secondary | ICD-10-CM

## 2017-06-03 DIAGNOSIS — G8929 Other chronic pain: Secondary | ICD-10-CM

## 2017-06-03 DIAGNOSIS — B1710 Acute hepatitis C without hepatic coma: Secondary | ICD-10-CM | POA: Insufficient documentation

## 2017-06-03 DIAGNOSIS — R739 Hyperglycemia, unspecified: Secondary | ICD-10-CM | POA: Insufficient documentation

## 2017-06-03 DIAGNOSIS — F1990 Other psychoactive substance use, unspecified, uncomplicated: Secondary | ICD-10-CM

## 2017-06-03 DIAGNOSIS — M545 Low back pain: Secondary | ICD-10-CM

## 2017-06-03 DIAGNOSIS — F1920 Other psychoactive substance dependence, uncomplicated: Secondary | ICD-10-CM | POA: Insufficient documentation

## 2017-06-03 DIAGNOSIS — F1910 Other psychoactive substance abuse, uncomplicated: Secondary | ICD-10-CM

## 2017-06-03 DIAGNOSIS — Z79899 Other long term (current) drug therapy: Secondary | ICD-10-CM | POA: Insufficient documentation

## 2017-06-03 LAB — RAPID URINE DRUG SCREEN, HOSP PERFORMED
AMPHETAMINES: NOT DETECTED
BARBITURATES: NOT DETECTED
BENZODIAZEPINES: POSITIVE — AB
COCAINE: POSITIVE — AB
Opiates: NOT DETECTED
TETRAHYDROCANNABINOL: NOT DETECTED

## 2017-06-03 LAB — CBC WITH DIFFERENTIAL/PLATELET
BASOS ABS: 0 10*3/uL (ref 0.0–0.1)
BASOS PCT: 0 %
Eosinophils Absolute: 0.1 10*3/uL (ref 0.0–0.7)
Eosinophils Relative: 2 %
HCT: 38.7 % — ABNORMAL LOW (ref 39.0–52.0)
HEMOGLOBIN: 12.5 g/dL — AB (ref 13.0–17.0)
LYMPHS PCT: 25 %
Lymphs Abs: 1.9 10*3/uL (ref 0.7–4.0)
MCH: 30.6 pg (ref 26.0–34.0)
MCHC: 32.3 g/dL (ref 30.0–36.0)
MCV: 94.9 fL (ref 78.0–100.0)
MONO ABS: 0.7 10*3/uL (ref 0.1–1.0)
Monocytes Relative: 10 %
NEUTROS ABS: 4.8 10*3/uL (ref 1.7–7.7)
NEUTROS PCT: 63 %
Platelets: 256 10*3/uL (ref 150–400)
RBC: 4.08 MIL/uL — AB (ref 4.22–5.81)
RDW: 13.3 % (ref 11.5–15.5)
WBC: 7.5 10*3/uL (ref 4.0–10.5)

## 2017-06-03 LAB — BASIC METABOLIC PANEL
Anion gap: 7 (ref 5–15)
BUN: 21 mg/dL — ABNORMAL HIGH (ref 6–20)
CHLORIDE: 97 mmol/L — AB (ref 101–111)
CO2: 27 mmol/L (ref 22–32)
CREATININE: 0.97 mg/dL (ref 0.61–1.24)
Calcium: 9.4 mg/dL (ref 8.9–10.3)
GFR calc non Af Amer: >60 mL/min (ref >60)
Glucose, Bld: 188 mg/dL — ABNORMAL HIGH (ref 65–99)
Potassium: 3.4 mmol/L — ABNORMAL LOW (ref 3.5–5.1)
SODIUM: 131 mmol/L — AB (ref 135–145)

## 2017-06-03 LAB — URINALYSIS, ROUTINE W REFLEX MICROSCOPIC
BILIRUBIN URINE: NEGATIVE
Glucose, UA: NEGATIVE mg/dL
Hgb urine dipstick: NEGATIVE
KETONES UR: NEGATIVE mg/dL
LEUKOCYTES UA: NEGATIVE
Nitrite: NEGATIVE
PH: 6 (ref 5.0–8.0)
Protein, ur: NEGATIVE mg/dL
SPECIFIC GRAVITY, URINE: 1.004 — AB (ref 1.005–1.030)

## 2017-06-03 LAB — LACTIC ACID, PLASMA
LACTIC ACID, VENOUS: 1.4 mmol/L (ref 0.5–1.9)
LACTIC ACID, VENOUS: 0.9 mmol/L (ref 0.5–1.9)

## 2017-06-03 LAB — PROTIME-INR
INR: 1.07
Prothrombin Time: 13.8 seconds (ref 11.4–15.2)

## 2017-06-03 LAB — SEDIMENTATION RATE: Sed Rate: 70 mm/hr — ABNORMAL HIGH (ref 0–16)

## 2017-06-03 MED ORDER — FENTANYL CITRATE (PF) 100 MCG/2ML IJ SOLN: 50.0000 ug | INTRAMUSCULAR | Status: DC | PRN

## 2017-06-03 MED ORDER — GADOBENATE DIMEGLUMINE 529 MG/ML IV SOLN
14.0000 mL | Freq: Once | INTRAVENOUS | Status: AC | PRN
  Administered 2017-06-03: 14 mL via INTRAVENOUS

## 2017-06-03 MED ORDER — DIAZEPAM 5 MG PO TABS
5.0000 mg | ORAL_TABLET | Freq: Once | ORAL | Status: AC
  Administered 2017-06-03: 5 mg via ORAL
  Filled 2017-06-03: qty 1

## 2017-06-03 MED ORDER — FENTANYL CITRATE (PF) 100 MCG/2ML IJ SOLN
50.0000 ug | INTRAMUSCULAR | Status: AC | PRN
  Administered 2017-06-03 (×2): 50 ug via INTRAVENOUS
  Filled 2017-06-03 (×2): qty 2

## 2017-06-03 NOTE — ED Notes (Signed)
Patient was witnessed by this nurse getting up and pouring out his urine sample. Pt was in obvious pain, but took extreme lengths to pour sample into the sink.  Patient scooted to edge of stretcher and walked 3 steps to get to sink and pour sample out.

## 2017-06-03 NOTE — Assessment & Plan Note (Signed)
Noted in hospital, check A1c>> 6.4% c/w prediabetes.

## 2017-06-03 NOTE — ED Provider Notes (Signed)
[facility] EMERGENCY DEPARTMENT Provider Note   CSN: [id number] Arrival date & time: 06/03/17  1015     History   Chief Complaint Chief Complaint  Patient presents with  . Back Pain    HPI AYDIAN DIMMICK is a 42 y.o. male.  HPI  Pt was seen at 1115. Per pt, c/o gradual onset and persistence of constant acute flair of his chronic low back "pain" for the past 1 week. Describes the pain as "aching," located in his lower back and radiating down his right leg. Pain worsens with palpation of the area and changing body positions. Denies incont/retention of bowel or bladder, no saddle anesthesia, no focal motor weakness, no tingling/numbness in extremities, no fevers, no injury, no abd pain.   The symptoms have been associated with no other complaints. The patient has a significant history of similar symptoms previously, recently being evaluated for this complaint and multiple prior evals for same. Pt was discharged from [facility] 1 week ago for dx LS osteomyelitis/discitis, BC +strep viridans, tx IV rocephin then PO amoxicillin. Pt endorses compliance with antibiotics. Pt also states he was evaluated by Pain Management this past week and rx suboxone and used IV cocaine.     Past Medical History:  Diagnosis Date  . ADHD (attention deficit hyperactivity disorder)   . Bipolar 1 disorder (HCC)   . Chronic back pain   . Chronic pain    chronic l leg pain  . DDD (degenerative disc disease), lumbar   . Degenerative disc disease, lumbar   . Depression   . GERD (gastroesophageal reflux disease)   . Hepatitis C   . Hepatitis C   . History of stomach ulcers   . Sciatica   . Substance abuse [facility])     Patient Active Problem List   Diagnosis Date Noted  . Hyperglycemia 06/03/2017  . Discitis   . Streptococcal bacteremia   . GERD (gastroesophageal reflux disease) 05/17/2017  . Bipolar disorder (HCC) 05/17/2017  . Acute osteomyelitis of lumbar spine (HCC) 05/17/2017  . Cocaine abuse with  cocaine-induced mood disorder (HCC) 01/20/2017  . Benzodiazepine abuse (HCC) 01/20/2017  . Cocaine use disorder, moderate, dependence (HCC) 05/23/2015  . Opioid use disorder, moderate, dependence (HCC) 05/23/2015  . 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 Substance or medication-induced bipolar and related disorder with onset during intoxication (HCC) 05/23/2015  . Chronic hepatitis C virus genotype 1a infection (HCC)   . 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 FRACTURE SURGERY    . HARDWARE REMOVAL  01/27/2012   Procedure: HARDWARE REMOVAL;  Surgeon: Kathryne Hitch, MD;  Location: [facility];  Service: Orthopedics;  Laterality: Left;  . TIBIA IM NAIL INSERTION  01/27/2012   Procedure: INTRAMEDULLARY (IM) NAIL TIBIAL;  Surgeon: Kathryne Hitch, MD;  Location: [facility];  Service: Orthopedics;  Laterality: Left;  Removal of IM Rod and Screws Left Tibia with Exchange Nail, Allograft Bone Graft Left Tibia        Home Medications    Prior to Admission medications   Medication Sig Start Date End Date Taking? Authorizing Provider  amoxicillin (AMOXIL) 500 MG capsule Take 1 capsule (500 mg total) by mouth 3 (three) times daily. 05/26/17 07/10/17 Yes Amin, Ankit Chirag, MD  buprenorphine-naloxone (SUBOXONE) 8-2 mg SUBL SL tablet Place 1 tablet under the tongue 2 (two) times daily. 05/31/17  Yes Gust Rung, DO  gabapentin (  NEURONTIN) 400 MG capsule Take 1,200 mg by mouth 2 (two) times daily.   Yes [provider]  methocarbamol (ROBAXIN) 500 MG tablet Take 500 mg by mouth 3 (three) times daily.   Yes [provider]  Oxycodone HCl 10 MG TABS Take 10 mg by mouth every 6 (six) hours as needed (back pain).   Yes [provider]  senna-docusate (SENOKOT-S) 8.6-50 MG tablet Take 1 tablet by mouth at bedtime as needed for up to 15 doses for mild constipation. 05/26/17  Yes  Amin, Loura Halt, MD  traZODone (DESYREL) 50 MG tablet Take 1 tablet (50 mg total) by mouth at bedtime as needed for up to 15 doses for sleep. 05/26/17  Yes Amin, Loura Halt, MD  gabapentin (NEURONTIN) 300 MG capsule Take 1 capsule (300 mg total) by mouth 3 (three) times daily. Patient not taking: Reported on 06/03/2017 05/26/17 06/25/17  Dimple Nanas, MD  pantoprazole (PROTONIX) 40 MG tablet Take 1 tablet (40 mg total) by mouth daily. Patient not taking: Reported on 06/03/2017 05/27/17 06/26/17  Dimple Nanas, MD  polyethylene glycol (MIRALAX / GLYCOLAX) packet Take 17 g by mouth daily as needed for up to 15 doses for severe constipation. Patient not taking: Reported on 06/03/2017 05/26/17   Dimple Nanas, MD    Family History Family History  Problem Relation Age of Onset  . Diabetes Mother     Social History Social History   Tobacco Use  . Smoking status: Current Every Day Smoker    Years: 24.00    Types: E-cigarettes  . Smokeless tobacco: Former Neurosurgeon    Quit date: 08/08/2006  Substance Use Topics  . Alcohol use: Yes    Comment: occasionally  . Drug use: Yes    Types: Cocaine, IV    Comment: last used 6-7 days ago     Allergies   Acetaminophen; Morphine and related; Buprenorphine hcl; and Meperidine   Review of Systems Review of Systems ROS: Statement: All systems negative except as marked or noted in the HPI; Constitutional: Negative for fever and chills. ; ; Eyes: Negative for eye pain, redness and discharge. ; ; ENMT: Negative for ear pain, hoarseness, nasal congestion, sinus pressure and sore throat. ; ; Cardiovascular: Negative for chest pain, palpitations, diaphoresis, dyspnea and peripheral edema. ; ; Respiratory: Negative for cough, wheezing and stridor. ; ; Gastrointestinal: Negative for nausea, vomiting, diarrhea, abdominal pain, blood in stool, hematemesis, jaundice and rectal bleeding. . ; ; Genitourinary: Negative for dysuria, flank pain and hematuria.  ; ; Musculoskeletal: +LBP. Negative for neck pain. Negative for swelling and trauma.; ; Skin: Negative for pruritus, rash, abrasions, blisters, bruising and skin lesion.; ; Neuro: Negative for headache, lightheadedness and neck stiffness. Negative for weakness, altered level of consciousness, altered mental status, extremity weakness, paresthesias, involuntary movement, seizure and syncope.       Physical Exam Updated Vital Signs BP (!) 135/105   Pulse (!) 120   Temp 98.6 F (37 C) (Oral)   Resp 18   SpO2 97%    Patient Vitals for the past 24 hrs:  BP Temp Temp src Pulse Resp SpO2  06/03/17 1402 118/80 - - (!) 110 18 99 %  06/03/17 1030 (!) 135/105 - - (!) 120 - 97 %  06/03/17 1023 (!) 153/95 98.6 F (37 C) Oral (!) 116 18 95 %     Physical Exam 1120: Physical examination:  Nursing notes reviewed; Vital signs and O2 SAT reviewed;  Constitutional: Well  developed, Well nourished, Well hydrated, In no acute distress; Head:  Normocephalic, atraumatic; Eyes: EOMI, PERRL, No scleral icterus; ENMT: Mouth and pharynx normal, Mucous membranes moist; Neck: Supple, Full range of motion, No lymphadenopathy; Cardiovascular: Tachycardic rate and rhythm, No gallop; Respiratory: Breath sounds clear & equal bilaterally, No wheezes.  Speaking full sentences with ease, Normal respiratory effort/excursion; Chest: Nontender, Movement normal; Abdomen: Soft, Nontender, Nondistended, Normal bowel sounds; Genitourinary: No CVA tenderness; Spine:  No midline CS, TS, LS tenderness.;; Extremities: Peripheral pulses normal, No tenderness, No edema, No calf edema or asymmetry.; Neuro: AA&Ox3, Major CN grossly intact.  Speech clear. No gross focal motor or sensory deficits in extremities Strength 5/5 equal bilat UE's and LE's, including great toe dorsiflexion.  DTR 2/4 equal bilat UE's and LE's.  No gross sensory deficits..; Skin: Color normal, Warm, Dry.   ED Treatments / Results  Labs (all labs ordered are listed,  but only abnormal results are displayed)   EKG None  Radiology   Procedures Procedures (including critical care time)  Medications Ordered in ED Medications  fentaNYL (SUBLIMAZE) injection 50 mcg (50 mcg Intravenous Given 06/03/17 1323)  diazepam (VALIUM) tablet 5 mg (5 mg Oral Given 06/03/17 1202)  gadobenate dimeglumine (MULTIHANCE) injection 14 mL (14 mLs Intravenous Contrast Given 06/03/17 1338)     Initial Impression / Assessment and Plan / ED Course  I have reviewed the triage vital signs and the nursing notes.  Pertinent labs & imaging results that were available during my care of the patient were reviewed by me and considered in my medical decision making (see chart for details).  MDM Reviewed: previous chart, nursing note and vitals Reviewed previous: labs, MRI and CT scan Interpretation: labs and MRI   Results for orders placed or performed during the hospital encounter of 06/03/17  Culture, blood (routine x 2)  Result Value Ref Range   Specimen Description BLOOD RIGHT ARM    Special Requests      BOTTLES DRAWN AEROBIC AND ANAEROBIC Blood Culture adequate volume Performed at [facility], [address], [place], [place] [address]    Culture PENDING    Report Status PENDING   Culture, blood (routine x 2)  Result Value Ref Range   Specimen Description BLOOD RIGHT ARM    Special Requests      BOTTLES DRAWN AEROBIC AND ANAEROBIC Blood Culture adequate volume Performed at [facility], [address], [place], [place] [address]    Culture PENDING    Report Status PENDING   Urinalysis, Routine w reflex microscopic  Result Value Ref Range   Color, Urine STRAW (A) YELLOW   APPearance CLEAR CLEAR   Specific Gravity, Urine 1.004 (L) 1.005 - 1.030   pH 6.0 5.0 - 8.0   Glucose, UA NEGATIVE NEGATIVE mg/dL   Hgb urine dipstick NEGATIVE NEGATIVE   Bilirubin Urine NEGATIVE NEGATIVE   Ketones, ur NEGATIVE NEGATIVE mg/dL   Protein, ur NEGATIVE NEGATIVE mg/dL   Nitrite  NEGATIVE NEGATIVE   Leukocytes, UA NEGATIVE NEGATIVE  Basic metabolic panel  Result Value Ref Range   Sodium 131 (L) 135 - 145 mmol/L   Potassium 3.4 (L) 3.5 - 5.1 mmol/L   Chloride 97 (L) 101 - 111 mmol/L   CO2 27 22 - 32 mmol/L   Glucose, Bld 188 (H) 65 - 99 mg/dL   BUN 21 (H) 6 - 20 mg/dL   Creatinine, Ser 0.98 0.61 - 1.24 mg/dL   Calcium 9.4 8.9 - 11.9 mg/dL   GFR calc  non Af Amer >60 >60 mL/min   GFR calc Af Amer >60 >60 mL/min   Anion gap 7 5 - 15  Lactic acid, plasma  Result Value Ref Range   Lactic Acid, Venous 1.4 0.5 - 1.9 mmol/L  CBC with Differential  Result Value Ref Range   WBC 7.5 4.0 - 10.5 K/uL   RBC 4.08 (L) 4.22 - 5.81 MIL/uL   Hemoglobin 12.5 (L) 13.0 - 17.0 g/dL   HCT 04.5 (L) 40.9 - 81.1 %   MCV 94.9 78.0 - 100.0 fL   MCH 30.6 26.0 - 34.0 pg   MCHC 32.3 30.0 - 36.0 g/dL   RDW 91.4 78.2 - 95.6 %   Platelets 256 150 - 400 K/uL   Neutrophils Relative % 63 %   Neutro Abs 4.8 1.7 - 7.7 K/uL   Lymphocytes Relative 25 %   Lymphs Abs 1.9 0.7 - 4.0 K/uL   Monocytes Relative 10 %   Monocytes Absolute 0.7 0.1 - 1.0 K/uL   Eosinophils Relative 2 %   Eosinophils Absolute 0.1 0.0 - 0.7 K/uL   Basophils Relative 0 %   Basophils Absolute 0.0 0.0 - 0.1 K/uL  Protime-INR  Result Value Ref Range   Prothrombin Time 13.8 11.4 - 15.2 seconds   INR 1.07   Urine rapid drug screen (hosp performed)  Result Value Ref Range   Opiates NONE DETECTED NONE DETECTED   Cocaine POSITIVE (A) NONE DETECTED   Benzodiazepines POSITIVE (A) NONE DETECTED   Amphetamines NONE DETECTED NONE DETECTED   Tetrahydrocannabinol NONE DETECTED NONE DETECTED   Barbiturates NONE DETECTED NONE DETECTED    Mr Lumbar Spine W Wo Contrast (assess For Abscess, Cord Compression) Result Date: 06/03/2017 CLINICAL DATA:  History of discitis and osteomyelitis. Severe back pain. EXAM: MRI LUMBAR SPINE WITHOUT AND WITH CONTRAST TECHNIQUE: Multiplanar and multiecho pulse sequences of the lumbar spine were  obtained without and with intravenous contrast. CONTRAST:  14mL MULTIHANCE GADOBENATE DIMEGLUMINE 529 MG/ML IV SOLN COMPARISON:  05/17/2017 FINDINGS: Segmentation:  Standard. Alignment:  Physiologic. Vertebrae: Schmorl's node along the superior endplate of L1. Mild chronic anterior L1 vertebral body height loss. Severe edema and enhancement throughout L2 and L3 vertebral bodies with severe progressive disc height loss and endplate irregularity most consistent with discitis-osteomyelitis. Mild epidural soft tissue enhancement along the posterior margin of the L2 and L3 vertebral bodies likely reflecting phlegmonous changes. Epidural enhancement circumferentially at the level of L2-3 likely postinflammatory. Small 7 x 5 mm cystic structure along the left paracentral posterior aspect of L2-3 disc with peripheral enhancement which may reflect a disc protrusion versus a small epidural. Soft tissue edema in the paraspinal soft tissues with enhancement consistent with phlegmonous changes without a drainable fluid collection. Conus medullaris and cauda equina: Conus extends to the L1 level. Conus and cauda equina appear normal. Paraspinal and other soft tissues: No other paraspinal abnormalities. Disc levels: Disc spaces: Degenerative disc disease with disc height loss at T12-L1 and L2-3. T12-L1: No significant disc bulge. No evidence of neural foraminal stenosis. No central canal stenosis. L1-L2: Mild broad-based disc bulge. No foraminal or central canal stenosis. No evidence of neural foraminal stenosis. No central canal stenosis. L2-L3: Small 7 x 5 mm cystic structure along the left paracentral posterior aspect of L2-3 disc with peripheral enhancement which may reflect a disc protrusion versus a small epidural. Mild bilateral facet arthropathy. No evidence of neural foraminal stenosis. No central canal stenosis. L3-L4: No significant disc bulge. No evidence of neural foraminal  stenosis. No central canal stenosis. L4-L5:  No significant disc bulge. No evidence of neural foraminal stenosis. No central canal stenosis. L5-S1: No significant disc bulge. No evidence of neural foraminal stenosis. No central canal stenosis. IMPRESSION: 1. Severe progressive discitis-osteomyelitis at L2-3 with epidural phlegmonous changes along the posterior margin of the L2 and L3 vertebral bodies. Circumferential epidural enhancement which is likely inflammatory and similar in appearance to the prior exam. Severe paraspinal inflammatory changes without a drainable fluid collection most consistent with a phlegmon which has increased compared with the prior exam. Small 7 x 5 mm cystic structure along the left paracentral posterior aspect of L2-3 disc with peripheral enhancement which may reflect a new disc protrusion versus more likely a small epidural. Electronically Signed   By: Elige Ko   On: 06/03/2017 14:11     1200:  Pt stated to staff he "could hardly walk" due to his LBP, but he was witnessed scooting to the edge of the stretcher, climbing off, and walking to the sink to pour out his urine sample. Pt made aware he will not receive any other pain meds other than fentanyl (ie: more potent narcotic than morphine and dilaudid) while in the ED. Pt verb understanding.   1510:  BC and UC obtained. Pt remains afebrile. T/C returned from [facility] Neurosurgery Dr. Jordan Likes, case discussed, including:  HPI, pertinent PM/SHx, VS/PE, dx testing, ED course and treatment:  He has viewed the MRI images, states no acute surgical issue at this time, appears natural progression of discitis as well as chronic disc disease, recommends to continue current abx per previous BC, repeat sed rate today and if sed rate 60< pt will need aspiration of area to further guide treatment, Ok to admit to [facility] hospitalist for now.  1555:   T/C returned from ID Dr. Drue Second, case discussed, including:  HPI, pertinent PM/SHx, VS/PE, dx testing, ED course and treatment:  States hold abx at  this time to await [facility], transfer to [facility] under [facility] service for IR consult for aspiration.   1610:   Pt and family informed re: dx testing results, including conversations with Neurosurgery and ID MD's, and that I recommend transfer and admission for further evaluation.  Pt refuses admission. Pt's family, ED RN and I encouraged pt to stay, continues to refuse.  Pt makes his own medical decisions.  Risks of AMA explained to pt and family, including, but not limited to:  Paralysis/paresis, sepsis, stroke, heart attack, cardiac arrythmia ("irregular heart rate/beat"), "passing out," temporary and/or permanent disability, death.  Pt and family verb understanding and pt continues to refuse admission, understanding the consequences of their decision.  I encouraged pt to follow up with his PMD on Tuesday, as well as going to is other f/u appointments from his previous admission, and return to the ED immediately if symptoms worsen, he changes his mind, or for any other concerns.  Pt and family verb understanding, agreeable. Pt asking for narcotic pain medication prescription "for just a few days." Pt already has suboxone. Offered muscle relaxer/non-narcotic medication; pt became agitated, raising his voice, refused. Pt's IV was removed and he left the ED, agitated and speaking in loud voice down hallway.           Final Clinical Impressions(s) / ED Diagnoses   Final diagnoses:  None    ED Discharge Orders    None       Izell, Labat, DO 06/06/17 1610

## 2017-06-03 NOTE — ED Triage Notes (Addendum)
Patient complaining of back pain. States he was recently released from [facility] 1 week ago for osteomyelitis. States "I'm afraid I may be leaking spinal fluid again. They sent me home with naproxen and it doesn't help at all." Also complaining of weakness to bilateral legs, right worse than left. Patient admitted to IV cocaine use 6-7 days ago.

## 2017-06-03 NOTE — Assessment & Plan Note (Signed)
I will start him on Suboxone 8-2 mg tablets, 2 tablets daily divided doses.  I will have him follow-up in 1 week.

## 2017-06-03 NOTE — ED Notes (Signed)
After discussing with the pt the importance of continuing care, patient says he has to leave and may go to [facility] in 2 or 3 days.

## 2017-06-04 ENCOUNTER — Inpatient Hospital Stay (HOSPITAL_COMMUNITY)
Admission: EM | Admit: 2017-06-04 | Discharge: 2017-06-23 | DRG: 477 | Disposition: A | Discharge status: Home / Self Care | Payer: Self-pay | Attending: Internal Medicine | Admitting: Internal Medicine

## 2017-06-04 ENCOUNTER — Encounter (HOSPITAL_COMMUNITY): Payer: Self-pay

## 2017-06-04 DIAGNOSIS — K5900 Constipation, unspecified: Secondary | ICD-10-CM | POA: Diagnosis present

## 2017-06-04 DIAGNOSIS — Z885 Allergy status to narcotic agent status: Secondary | ICD-10-CM

## 2017-06-04 DIAGNOSIS — R7989 Other specified abnormal findings of blood chemistry: Secondary | ICD-10-CM | POA: Diagnosis present

## 2017-06-04 DIAGNOSIS — M62838 Other muscle spasm: Secondary | ICD-10-CM | POA: Diagnosis present

## 2017-06-04 DIAGNOSIS — M4646 Discitis, unspecified, lumbar region: Secondary | ICD-10-CM | POA: Diagnosis present

## 2017-06-04 DIAGNOSIS — G8929 Other chronic pain: Secondary | ICD-10-CM | POA: Diagnosis present

## 2017-06-04 DIAGNOSIS — M4626 Osteomyelitis of vertebra, lumbar region: Principal | ICD-10-CM | POA: Diagnosis present

## 2017-06-04 DIAGNOSIS — I10 Essential (primary) hypertension: Secondary | ICD-10-CM | POA: Diagnosis present

## 2017-06-04 DIAGNOSIS — G062 Extradural and subdural abscess, unspecified: Secondary | ICD-10-CM | POA: Diagnosis present

## 2017-06-04 DIAGNOSIS — R51 Headache: Secondary | ICD-10-CM | POA: Diagnosis not present

## 2017-06-04 DIAGNOSIS — M868X6 Other osteomyelitis, lower leg: Secondary | ICD-10-CM

## 2017-06-04 DIAGNOSIS — Z833 Family history of diabetes mellitus: Secondary | ICD-10-CM

## 2017-06-04 DIAGNOSIS — B182 Chronic viral hepatitis C: Secondary | ICD-10-CM | POA: Diagnosis present

## 2017-06-04 DIAGNOSIS — G709 Myoneural disorder, unspecified: Secondary | ICD-10-CM | POA: Diagnosis present

## 2017-06-04 DIAGNOSIS — M4620 Osteomyelitis of vertebra, site unspecified: Secondary | ICD-10-CM

## 2017-06-04 DIAGNOSIS — E876 Hypokalemia: Secondary | ICD-10-CM | POA: Diagnosis present

## 2017-06-04 DIAGNOSIS — Z8711 Personal history of peptic ulcer disease: Secondary | ICD-10-CM

## 2017-06-04 DIAGNOSIS — F319 Bipolar disorder, unspecified: Secondary | ICD-10-CM | POA: Diagnosis present

## 2017-06-04 DIAGNOSIS — R739 Hyperglycemia, unspecified: Secondary | ICD-10-CM

## 2017-06-04 DIAGNOSIS — I959 Hypotension, unspecified: Secondary | ICD-10-CM | POA: Diagnosis not present

## 2017-06-04 DIAGNOSIS — B954 Other streptococcus as the cause of diseases classified elsewhere: Secondary | ICD-10-CM | POA: Diagnosis present

## 2017-06-04 DIAGNOSIS — E1169 Type 2 diabetes mellitus with other specified complication: Secondary | ICD-10-CM | POA: Diagnosis not present

## 2017-06-04 DIAGNOSIS — M79605 Pain in left leg: Secondary | ICD-10-CM | POA: Diagnosis present

## 2017-06-04 DIAGNOSIS — F1120 Opioid dependence, uncomplicated: Secondary | ICD-10-CM | POA: Diagnosis present

## 2017-06-04 DIAGNOSIS — F17290 Nicotine dependence, other tobacco product, uncomplicated: Secondary | ICD-10-CM | POA: Diagnosis present

## 2017-06-04 DIAGNOSIS — E871 Hypo-osmolality and hyponatremia: Secondary | ICD-10-CM | POA: Diagnosis present

## 2017-06-04 DIAGNOSIS — R7881 Bacteremia: Secondary | ICD-10-CM | POA: Diagnosis present

## 2017-06-04 DIAGNOSIS — M869 Osteomyelitis, unspecified: Secondary | ICD-10-CM | POA: Diagnosis present

## 2017-06-04 DIAGNOSIS — F332 Major depressive disorder, recurrent severe without psychotic features: Secondary | ICD-10-CM | POA: Diagnosis present

## 2017-06-04 DIAGNOSIS — Z8659 Personal history of other mental and behavioral disorders: Secondary | ICD-10-CM

## 2017-06-04 DIAGNOSIS — M4640 Discitis, unspecified, site unspecified: Secondary | ICD-10-CM | POA: Diagnosis present

## 2017-06-04 DIAGNOSIS — Z886 Allergy status to analgesic agent status: Secondary | ICD-10-CM

## 2017-06-04 DIAGNOSIS — K219 Gastro-esophageal reflux disease without esophagitis: Secondary | ICD-10-CM | POA: Diagnosis present

## 2017-06-04 DIAGNOSIS — Z79899 Other long term (current) drug therapy: Secondary | ICD-10-CM

## 2017-06-04 DIAGNOSIS — R402414 Glasgow coma scale score 13-15, 24 hours or more after hospital admission: Secondary | ICD-10-CM | POA: Diagnosis not present

## 2017-06-04 DIAGNOSIS — F1910 Other psychoactive substance abuse, uncomplicated: Secondary | ICD-10-CM | POA: Diagnosis present

## 2017-06-04 DIAGNOSIS — R918 Other nonspecific abnormal finding of lung field: Secondary | ICD-10-CM | POA: Diagnosis present

## 2017-06-04 DIAGNOSIS — M1990 Unspecified osteoarthritis, unspecified site: Secondary | ICD-10-CM | POA: Diagnosis present

## 2017-06-04 LAB — SEDIMENTATION RATE: SED RATE: 99 mm/h — AB (ref 0–16)

## 2017-06-04 LAB — CBC
HCT: 37.9 % — ABNORMAL LOW (ref 39.0–52.0)
HEMOGLOBIN: 12.1 g/dL — AB (ref 13.0–17.0)
MCH: 30.3 pg (ref 26.0–34.0)
MCHC: 31.9 g/dL (ref 30.0–36.0)
MCV: 95 fL (ref 78.0–100.0)
Platelets: 240 10*3/uL (ref 150–400)
RBC: 3.99 MIL/uL — AB (ref 4.22–5.81)
RDW: 13.1 % (ref 11.5–15.5)
WBC: 6.5 10*3/uL (ref 4.0–10.5)

## 2017-06-04 LAB — GLUCOSE, CAPILLARY
Glucose-Capillary: 119 mg/dL — ABNORMAL HIGH (ref 65–99)
Glucose-Capillary: 112 mg/dL — ABNORMAL HIGH (ref 65–99)

## 2017-06-04 LAB — CREATININE, SERUM: CREATININE: 0.9 mg/dL (ref 0.61–1.24)

## 2017-06-04 LAB — CBG MONITORING, ED: Glucose-Capillary: 177 mg/dL — ABNORMAL HIGH (ref 65–99)

## 2017-06-04 LAB — MAGNESIUM: MAGNESIUM: 1.7 mg/dL (ref 1.7–2.4)

## 2017-06-04 LAB — URINE CULTURE: Culture: NO GROWTH

## 2017-06-04 MED ORDER — HYDROMORPHONE HCL 1 MG/ML IJ SOLN
1.0000 mg | Freq: Once | INTRAMUSCULAR | Status: AC
  Administered 2017-06-04: 1 mg via INTRAVENOUS
  Filled 2017-06-04: qty 1

## 2017-06-04 MED ORDER — SODIUM CHLORIDE 0.9 % IV SOLN
INTRAVENOUS | Status: AC
  Administered 2017-06-04: 06:00:00 via INTRAVENOUS

## 2017-06-04 MED ORDER — HYDROMORPHONE HCL 2 MG/ML IJ SOLN
1.0000 mg | INTRAMUSCULAR | Status: DC | PRN
  Administered 2017-06-04: 1 mg via INTRAVENOUS
  Filled 2017-06-04: qty 1

## 2017-06-04 MED ORDER — PANTOPRAZOLE SODIUM 40 MG PO TBEC
DELAYED_RELEASE_TABLET | ORAL | Status: AC
  Filled 2017-06-04: qty 1

## 2017-06-04 MED ORDER — KETOROLAC TROMETHAMINE 30 MG/ML IJ SOLN
30.0000 mg | Freq: Once | INTRAMUSCULAR | Status: AC
  Administered 2017-06-04: 30 mg via INTRAVENOUS
  Filled 2017-06-04: qty 1

## 2017-06-04 MED ORDER — GABAPENTIN 300 MG PO CAPS
ORAL_CAPSULE | ORAL | Status: AC
  Administered 2017-06-04: 300 mg via ORAL
  Filled 2017-06-04: qty 1

## 2017-06-04 MED ORDER — BUPRENORPHINE HCL-NALOXONE HCL 8-2 MG SL SUBL
SUBLINGUAL_TABLET | SUBLINGUAL | Status: AC
  Filled 2017-06-04: qty 1

## 2017-06-04 MED ORDER — ENOXAPARIN SODIUM 40 MG/0.4ML ~~LOC~~ SOLN
40.0000 mg | SUBCUTANEOUS | Status: DC
  Filled 2017-06-04: qty 0.4

## 2017-06-04 MED ORDER — SENNOSIDES-DOCUSATE SODIUM 8.6-50 MG PO TABS
1.0000 | ORAL_TABLET | Freq: Every evening | ORAL | Status: DC | PRN
  Filled 2017-06-04: qty 1

## 2017-06-04 MED ORDER — ONDANSETRON HCL 4 MG/2ML IJ SOLN
4.0000 mg | Freq: Four times a day (QID) | INTRAMUSCULAR | Status: DC | PRN
  Filled 2017-06-04: qty 2

## 2017-06-04 MED ORDER — NICOTINE 21 MG/24HR TD PT24
21.0000 mg | MEDICATED_PATCH | Freq: Every day | TRANSDERMAL | Status: DC
  Administered 2017-06-05 – 2017-06-23 (×18): 21 mg via TRANSDERMAL
  Filled 2017-06-04 (×19): qty 1

## 2017-06-04 MED ORDER — INSULIN ASPART 100 UNIT/ML ~~LOC~~ SOLN
0.0000 [IU] | Freq: Three times a day (TID) | SUBCUTANEOUS | Status: DC
  Administered 2017-06-04 – 2017-06-05 (×2): 3 [IU] via SUBCUTANEOUS
  Administered 2017-06-06: 5 [IU] via SUBCUTANEOUS
  Administered 2017-06-06: 2 [IU] via SUBCUTANEOUS
  Administered 2017-06-07 – 2017-06-08 (×3): 3 [IU] via SUBCUTANEOUS
  Administered 2017-06-08: 5 [IU] via SUBCUTANEOUS
  Administered 2017-06-09: 2 [IU] via SUBCUTANEOUS
  Administered 2017-06-10: 3 [IU] via SUBCUTANEOUS
  Administered 2017-06-10: 15 [IU] via SUBCUTANEOUS
  Administered 2017-06-11: 5 [IU] via SUBCUTANEOUS
  Administered 2017-06-11: 3 [IU] via SUBCUTANEOUS
  Administered 2017-06-11: 5 [IU] via SUBCUTANEOUS
  Administered 2017-06-12: 3 [IU] via SUBCUTANEOUS
  Administered 2017-06-12: 11 [IU] via SUBCUTANEOUS
  Administered 2017-06-13: 3 [IU] via SUBCUTANEOUS
  Administered 2017-06-13: 2 [IU] via SUBCUTANEOUS
  Administered 2017-06-13: 3 [IU] via SUBCUTANEOUS
  Administered 2017-06-14: 5 [IU] via SUBCUTANEOUS
  Administered 2017-06-14 – 2017-06-15 (×2): 2 [IU] via SUBCUTANEOUS
  Administered 2017-06-15: 3 [IU] via SUBCUTANEOUS
  Administered 2017-06-16: 2 [IU] via SUBCUTANEOUS
  Administered 2017-06-16 – 2017-06-17 (×4): 3 [IU] via SUBCUTANEOUS
  Administered 2017-06-18 (×2): 2 [IU] via SUBCUTANEOUS
  Administered 2017-06-18: 3 [IU] via SUBCUTANEOUS
  Administered 2017-06-19: 2 [IU] via SUBCUTANEOUS
  Administered 2017-06-19 – 2017-06-20 (×2): 3 [IU] via SUBCUTANEOUS
  Administered 2017-06-20: 5 [IU] via SUBCUTANEOUS
  Administered 2017-06-20: 3 [IU] via SUBCUTANEOUS
  Administered 2017-06-21: 2 [IU] via SUBCUTANEOUS
  Administered 2017-06-21: 5 [IU] via SUBCUTANEOUS
  Administered 2017-06-21: 2 [IU] via SUBCUTANEOUS
  Administered 2017-06-22: 5 [IU] via SUBCUTANEOUS
  Administered 2017-06-22 (×2): 3 [IU] via SUBCUTANEOUS
  Administered 2017-06-22: 5 [IU] via SUBCUTANEOUS
  Administered 2017-06-23: 3 [IU] via SUBCUTANEOUS
  Administered 2017-06-23: 8 [IU] via SUBCUTANEOUS
  Filled 2017-06-04: qty 1

## 2017-06-04 MED ORDER — BUPRENORPHINE HCL-NALOXONE HCL 8-2 MG SL SUBL
1.0000 | SUBLINGUAL_TABLET | Freq: Two times a day (BID) | SUBLINGUAL | Status: DC
  Administered 2017-06-04 – 2017-06-15 (×22): 1 via SUBLINGUAL
  Filled 2017-06-04 (×21): qty 1

## 2017-06-04 MED ORDER — HYDROMORPHONE HCL 2 MG/ML IJ SOLN
2.0000 mg | Freq: Once | INTRAMUSCULAR | Status: AC
  Administered 2017-06-04: 2 mg via INTRAVENOUS
  Filled 2017-06-04: qty 1

## 2017-06-04 MED ORDER — ONDANSETRON HCL 4 MG PO TABS
4.0000 mg | ORAL_TABLET | Freq: Four times a day (QID) | ORAL | Status: DC | PRN
  Administered 2017-06-23: 4 mg via ORAL
  Filled 2017-06-04: qty 1

## 2017-06-04 MED ORDER — ACETAMINOPHEN 650 MG RE SUPP: 650.0000 mg | Freq: Four times a day (QID) | RECTAL | Status: DC | PRN

## 2017-06-04 MED ORDER — HYDROMORPHONE HCL 2 MG/ML IJ SOLN: 1.0000 mg | INTRAMUSCULAR | Status: DC | PRN

## 2017-06-04 MED ORDER — POLYETHYLENE GLYCOL 3350 17 G PO PACK
17.0000 g | PACK | Freq: Two times a day (BID) | ORAL | Status: DC
  Administered 2017-06-05 – 2017-06-22 (×34): 17 g via ORAL
  Filled 2017-06-04 (×36): qty 1

## 2017-06-04 MED ORDER — HYDROMORPHONE HCL 2 MG/ML IJ SOLN
2.0000 mg | INTRAMUSCULAR | Status: DC | PRN
  Administered 2017-06-05 – 2017-06-07 (×14): 2 mg via INTRAVENOUS
  Filled 2017-06-04 (×16): qty 1

## 2017-06-04 MED ORDER — KETOROLAC TROMETHAMINE 30 MG/ML IJ SOLN
30.0000 mg | Freq: Four times a day (QID) | INTRAMUSCULAR | Status: AC | PRN
  Administered 2017-06-04 – 2017-06-08 (×10): 30 mg via INTRAVENOUS
  Filled 2017-06-04 (×10): qty 1

## 2017-06-04 MED ORDER — TRAZODONE HCL 50 MG PO TABS
50.0000 mg | ORAL_TABLET | Freq: Every evening | ORAL | Status: DC | PRN
  Administered 2017-06-05 – 2017-06-10 (×6): 50 mg via ORAL
  Filled 2017-06-04 (×6): qty 1

## 2017-06-04 MED ORDER — FLEET ENEMA 7-19 GM/118ML RE ENEM
1.0000 | ENEMA | Freq: Once | RECTAL | Status: AC | PRN
  Administered 2017-06-06: 1 via RECTAL
  Filled 2017-06-04: qty 1

## 2017-06-04 MED ORDER — GABAPENTIN 300 MG PO CAPS
300.0000 mg | ORAL_CAPSULE | Freq: Three times a day (TID) | ORAL | Status: DC
  Administered 2017-06-04 – 2017-06-23 (×57): 300 mg via ORAL
  Filled 2017-06-04 (×56): qty 1

## 2017-06-04 MED ORDER — PENICILLIN G POT IN DEXTROSE 60000 UNIT/ML IV SOLN
3.0000 10*6.[IU] | INTRAVENOUS | Status: DC
  Administered 2017-06-04 – 2017-06-07 (×15): 3 10*6.[IU] via INTRAVENOUS
  Filled 2017-06-04 (×3): qty 50
  Filled 2017-06-04 (×2): qty 0
  Filled 2017-06-04 (×5): qty 50
  Filled 2017-06-04: qty 0
  Filled 2017-06-04: qty 50
  Filled 2017-06-04: qty 0
  Filled 2017-06-04 (×9): qty 50

## 2017-06-04 MED ORDER — PANTOPRAZOLE SODIUM 40 MG PO TBEC
40.0000 mg | DELAYED_RELEASE_TABLET | Freq: Every day | ORAL | Status: DC
  Administered 2017-06-04 – 2017-06-23 (×19): 40 mg via ORAL
  Filled 2017-06-04 (×19): qty 1

## 2017-06-04 MED ORDER — ACETAMINOPHEN 325 MG PO TABS
650.0000 mg | ORAL_TABLET | Freq: Four times a day (QID) | ORAL | Status: DC | PRN
  Administered 2017-06-04 – 2017-06-14 (×3): 650 mg via ORAL
  Filled 2017-06-04 (×3): qty 2

## 2017-06-04 MED ORDER — POTASSIUM CHLORIDE 20 MEQ/15ML (10%) PO SOLN
40.0000 meq | Freq: Once | ORAL | Status: AC
  Administered 2017-06-04: 40 meq via ORAL
  Filled 2017-06-04: qty 30

## 2017-06-04 MED ORDER — POLYETHYLENE GLYCOL 3350 17 G PO PACK: 17.0000 g | PACK | Freq: Every day | ORAL | Status: DC | PRN

## 2017-06-04 MED ORDER — METHOCARBAMOL 500 MG PO TABS
500.0000 mg | ORAL_TABLET | Freq: Three times a day (TID) | ORAL | Status: DC
  Administered 2017-06-04 – 2017-06-21 (×52): 500 mg via ORAL
  Filled 2017-06-04 (×52): qty 1

## 2017-06-04 MED ORDER — INSULIN ASPART 100 UNIT/ML ~~LOC~~ SOLN: 0.0000 [IU] | Freq: Every day | SUBCUTANEOUS | Status: DC

## 2017-06-04 NOTE — ED Notes (Signed)
Suboxone and protonix scanned in room 12 and given but not showing that it was given in Epic.

## 2017-06-04 NOTE — ED Notes (Signed)
Per report from previous nurse, patient to get tylenol and robaxin when [facility] is en route to ER to transfer patient to [facility]. Also to given dilaudid  IV upon [facility] arrival at ER to help with pain during transport.

## 2017-06-04 NOTE — H&P (Addendum)
History and Physical    Joshua Thomas:[id number] DOB: Oct 09, 1975 DOA: 06/04/2017  PCP: Patient, No Pcp Per   Patient coming from: Home  Chief Complaint: Low back pain  HPI: Joshua Thomas is a 42 y.o. male with medical history significant for bipolar disorder, chronic low back pain, tobacco abuse, hepatitis C, polysubstance abuse disorder with IV drug abuse, and GERD who was recently discharged from [facility] on 05/26/2017 after treatment for acute discitis/osteomyelitis in his lumbar spine along with strep viridans bacteremia with plans to continue amoxicillin for a total of 6 weeks.  He was also recently started on Suboxone a few days ago due to opioid use disorder.  He presented to the emergency department earlier yesterday with an acute flare of his chronic low back pain that has been ongoing for the last week.  The pain is described as an ache and can be sharp and stabbing at times with radiation to his bilateral knees.  There is no signs of cauda equina with any bowel or bladder incontinence or saddle anesthesia.  He states that he has been compliant with his home antibiotics and has recently used IV cocaine.  He underwent an MRI of his low back earlier today demonstrating severe progressive discitis/osteomyelitis at L2/3 with epidural phlegmonous changes that appeared drainable with increase in size from prior exam.  ED physician at that time consulted with neurosurgery Dr. Jordan Likes at [facility] who reviewed his MRI images with no acute surgical issue noted at this time.  Discussion was also had with ID physician Dr. Drue Second who recommended withholding antibiotics at this time with transfer to [facility] for IR consult and aspiration.  It appears that patient's pain was not well controlled and became upset and left AMA.  He has now returned with unrelenting pain and is agreeable for transfer. He denies any fevers, chills, focal motor weakness, numbness, or tingling in his extremities.   ED  Course: Currently vital signs are stable with some mild tachycardia.  Notably, there is no leukocytosis or fever noted.  Hemoglobin is stable at 12.5 and sodium is 131 and potassium 3.4.  Patient has been given some IV Toradol as well as Dilaudid with some relief in his pain currently.  He is agreeable for transfer at this time to [facility] for further evaluation.  Review of Systems: All others reviewed and otherwise negative.  Past Medical History:  Diagnosis Date  . ADHD (attention deficit hyperactivity disorder)   . Bipolar 1 disorder (HCC)   . Chronic back pain   . Chronic pain    chronic l leg pain  . DDD (degenerative disc disease), lumbar   . Degenerative disc disease, lumbar   . Depression   . GERD (gastroesophageal reflux disease)   . Hepatitis C   . Hepatitis C   . History of stomach ulcers   . Sciatica   . Substance abuse [facility])     Past Surgical History:  Procedure Laterality Date  . FRACTURE SURGERY    . HARDWARE REMOVAL  01/27/2012   Procedure: HARDWARE REMOVAL;  Surgeon: Kathryne Hitch, MD;  Location: [facility] ORS;  Service: Orthopedics;  Laterality: Left;  . TIBIA IM NAIL INSERTION  01/27/2012   Procedure: INTRAMEDULLARY (IM) NAIL TIBIAL;  Surgeon: Kathryne Hitch, MD;  Location: [facility];  Service: Orthopedics;  Laterality: Left;  Removal of IM Rod and Screws Left Tibia with Exchange Nail, Allograft Bone Graft Left Tibia     reports that he  has been smoking e-cigarettes.  He has smoked for the past 24.00 years. He quit smokeless tobacco use about 10 years ago. He reports that he drinks alcohol. He reports that he has current or past drug history. Drugs: Cocaine and IV.  Allergies  Allergen Reactions  . Acetaminophen Other (See Comments)    Due to Ulcers/ Liver Damage  . Morphine And Related Itching  . Buprenorphine Hcl Itching  . Meperidine Rash    Family History  Problem Relation Age of Onset  . Diabetes Mother     Prior to Admission medications     Medication Sig Start Date End Date Taking? Authorizing Provider  amoxicillin (AMOXIL) 500 MG capsule Take 1 capsule (500 mg total) by mouth 3 (three) times daily. 05/26/17 07/10/17  Amin, Loura Halt, MD  buprenorphine-naloxone (SUBOXONE) 8-2 mg SUBL SL tablet Place 1 tablet under the tongue 2 (two) times daily. 05/31/17   Gust Rung, DO  gabapentin (NEURONTIN) 300 MG capsule Take 1 capsule (300 mg total) by mouth 3 (three) times daily. Patient not taking: Reported on 06/03/2017 05/26/17 06/25/17  Dimple Nanas, MD  gabapentin (NEURONTIN) 400 MG capsule Take 1,200 mg by mouth 2 (two) times daily.    [provider]  methocarbamol (ROBAXIN) 500 MG tablet Take 500 mg by mouth 3 (three) times daily.    [provider]  Oxycodone HCl 10 MG TABS Take 10 mg by mouth every 6 (six) hours as needed (back pain).    [provider]  pantoprazole (PROTONIX) 40 MG tablet Take 1 tablet (40 mg total) by mouth daily. Patient not taking: Reported on 06/03/2017 05/27/17 06/26/17  Dimple Nanas, MD  polyethylene glycol (MIRALAX / GLYCOLAX) packet Take 17 g by mouth daily as needed for up to 15 doses for severe constipation. Patient not taking: Reported on 06/03/2017 05/26/17   Dimple Nanas, MD  senna-docusate (SENOKOT-S) 8.6-50 MG tablet Take 1 tablet by mouth at bedtime as needed for up to 15 doses for mild constipation. 05/26/17   Amin, Loura Halt, MD  traZODone (DESYREL) 50 MG tablet Take 1 tablet (50 mg total) by mouth at bedtime as needed for up to 15 doses for sleep. 05/26/17   Dimple Nanas, MD    Physical Exam: Vitals:   06/04/17 0219 06/04/17 0222  BP:  (!) 152/89  Pulse:  (!) 110  Resp:  20  Temp:  98.7 F (37.1 C)  SpO2:  100%  Weight: 72.6 kg (160 lb)   Height:  (1.6 m)     Constitutional: Mildly distressed and anxious Vitals:   06/04/17 0219 06/04/17 0222  BP:  (!) 152/89  Pulse:  (!) 110  Resp:  20  Temp:  98.7 F (37.1 C)  SpO2:  100%   Weight: 72.6 kg (160 lb)   Height:  (1.6 m)    Eyes: lids and conjunctivae normal ENMT: Mucous membranes are moist.  Neck: normal, supple Respiratory: clear to auscultation bilaterally. Normal respiratory effort. No accessory muscle use.  Cardiovascular: Regular rate and rhythm, no murmurs. No extremity edema. Abdomen: no tenderness, no distention. Bowel sounds positive.  Musculoskeletal:  No joint deformity upper and lower extremities.   Skin: no rashes, lesions, ulcers.   Labs on Admission: I have personally reviewed following labs and imaging studies  CBC: Recent Labs  Lab 06/03/17 1152  WBC 7.5  NEUTROABS 4.8  HGB 12.5*  HCT 38.7*  MCV 94.9  PLT 256   Basic Metabolic  Panel: Recent Labs  Lab 06/03/17 1152  NA 131*  K 3.4*  CL 97*  CO2 27  GLUCOSE 188*  BUN 21*  CREATININE 0.97  CALCIUM 9.4   GFR: Estimated Creatinine Clearance: 88.7 mL/min (by C-G formula based on SCr of 0.97 mg/dL). Liver Function Tests: No results for input(s): AST, ALT, ALKPHOS, BILITOT, PROT, ALBUMIN in the last 168 hours. No results for input(s): LIPASE, AMYLASE in the last 168 hours. No results for input(s): AMMONIA in the last 168 hours. Coagulation Profile: Recent Labs  Lab 06/03/17 1152  INR 1.07   Cardiac Enzymes: No results for input(s): CKTOTAL, CKMB, CKMBINDEX, TROPONINI in the last 168 hours. BNP (last 3 results) No results for input(s): PROBNP in the last 8760 hours. HbA1C: No results for input(s): HGBA1C in the last 72 hours. CBG: Recent Labs  Lab 05/31/17 1156  GLUCAP 113*   Lipid Profile: No results for input(s): CHOL, HDL, LDLCALC, TRIG, CHOLHDL, LDLDIRECT in the last 72 hours. Thyroid Function Tests: No results for input(s): TSH, T4TOTAL, FREET4, T3FREE, THYROIDAB in the last 72 hours. Anemia Panel: No results for input(s): VITAMINB12, FOLATE, FERRITIN, TIBC, IRON, RETICCTPCT in the last 72 hours. Urine analysis:    Component Value Date/Time    COLORURINE STRAW (A) 06/03/2017 1220   APPEARANCEUR CLEAR 06/03/2017 1220   APPEARANCEUR Hazy 09/28/2013 1707   LABSPEC 1.004 (L) 06/03/2017 1220   LABSPEC 1.017 09/28/2013 1707   PHURINE 6.0 06/03/2017 1220   GLUCOSEU NEGATIVE 06/03/2017 1220   GLUCOSEU Negative 09/28/2013 1707   HGBUR NEGATIVE 06/03/2017 1220   BILIRUBINUR NEGATIVE 06/03/2017 1220   BILIRUBINUR Negative 09/28/2013 1707   KETONESUR NEGATIVE 06/03/2017 1220   PROTEINUR NEGATIVE 06/03/2017 1220   UROBILINOGEN 0.2 03/24/2014 1341   NITRITE NEGATIVE 06/03/2017 1220   LEUKOCYTESUR NEGATIVE 06/03/2017 1220   LEUKOCYTESUR Negative 09/28/2013 1707    Radiological Exams on Admission: Mr Lumbar Spine W Wo Contrast (assess For Abscess, Cord Compression)  Result Date: 06/03/2017 CLINICAL DATA:  History of discitis and osteomyelitis. Severe back pain. EXAM: MRI LUMBAR SPINE WITHOUT AND WITH CONTRAST TECHNIQUE: Multiplanar and multiecho pulse sequences of the lumbar spine were obtained without and with intravenous contrast. CONTRAST:  14mL MULTIHANCE GADOBENATE DIMEGLUMINE 529 MG/ML IV SOLN COMPARISON:  05/17/2017 FINDINGS: Segmentation:  Standard. Alignment:  Physiologic. Vertebrae: Schmorl's node along the superior endplate of L1. Mild chronic anterior L1 vertebral body height loss. Severe edema and enhancement throughout L2 and L3 vertebral bodies with severe progressive disc height loss and endplate irregularity most consistent with discitis-osteomyelitis. Mild epidural soft tissue enhancement along the posterior margin of the L2 and L3 vertebral bodies likely reflecting phlegmonous changes. Epidural enhancement circumferentially at the level of L2-3 likely postinflammatory. Small 7 x 5 mm cystic structure along the left paracentral posterior aspect of L2-3 disc with peripheral enhancement which may reflect a disc protrusion versus a small epidural. Soft tissue edema in the paraspinal soft tissues with enhancement consistent with  phlegmonous changes without a drainable fluid collection. Conus medullaris and cauda equina: Conus extends to the L1 level. Conus and cauda equina appear normal. Paraspinal and other soft tissues: No other paraspinal abnormalities. Disc levels: Disc spaces: Degenerative disc disease with disc height loss at T12-L1 and L2-3. T12-L1: No significant disc bulge. No evidence of neural foraminal stenosis. No central canal stenosis. L1-L2: Mild broad-based disc bulge. No foraminal or central canal stenosis. No evidence of neural foraminal stenosis. No central canal stenosis. L2-L3: Small 7 x 5 mm cystic structure along the  left paracentral posterior aspect of L2-3 disc with peripheral enhancement which may reflect a disc protrusion versus a small epidural. Mild bilateral facet arthropathy. No evidence of neural foraminal stenosis. No central canal stenosis. L3-L4: No significant disc bulge. No evidence of neural foraminal stenosis. No central canal stenosis. L4-L5: No significant disc bulge. No evidence of neural foraminal stenosis. No central canal stenosis. L5-S1: No significant disc bulge. No evidence of neural foraminal stenosis. No central canal stenosis. IMPRESSION: 1. Severe progressive discitis-osteomyelitis at L2-3 with epidural phlegmonous changes along the posterior margin of the L2 and L3 vertebral bodies. Circumferential epidural enhancement which is likely inflammatory and similar in appearance to the prior exam. Severe paraspinal inflammatory changes without a drainable fluid collection most consistent with a phlegmon which has increased compared with the prior exam. Small 7 x 5 mm cystic structure along the left paracentral posterior aspect of L2-3 disc with peripheral enhancement which may reflect a new disc protrusion versus more likely a small epidural. Electronically Signed   By: Elige Ko   On: 06/03/2017 14:11    Assessment/Plan Principal Problem:   Acute osteomyelitis of lumbar spine  (HCC) Active Problems:   Chronic hepatitis C virus genotype 1a infection (HCC)   Opioid use disorder, moderate, dependence (HCC)   GERD (gastroesophageal reflux disease)   Bipolar disorder (HCC)   Discitis    1. Severe progressive L2/3 discitis/osteomyelitis with epidural phlegmon.  Recommended for transfer to [facility] for drainage by IR as well as further evaluation by ID.  Blood cultures currently pending and recommendations are to withhold antibiotics until drainage has taken place.  Prior history of Streptococcus viridans bacteremia noted.  Pain control with Toradol as needed which appears to help greatly.  Continue Suboxone.  Will need enema for constipation. 2. Mild hypokalemia.  Orally replete and check magnesium level. 3. Mild hyponatremia.  Initiate gentle normal saline, time limited.  Recheck a.m. labs. 4. GERD.  PPI. 5. Depression/bipolar.  Continue Cymbalta and Seroquel.   DVT prophylaxis: Lovenox Code Status: Full Family Communication: Mother at bedside Disposition Plan:Transfer to [facility] for further evaluation by ID Consults called:ID and NS called by EDP earlier Admission status: Inpatient, MedSurg   Pratik Hoover Brunette DO [facility] Pager [phone]  If 7PM-7AM, please contact night-coverage [email or web address] Password [email or web address]  06/04/2017, 4:08 AM

## 2017-06-04 NOTE — Progress Notes (Signed)
ANTIBIOTIC CONSULT NOTE - INITIAL  Pharmacy Consult for Penicllin Indication: Strep viridans bacteremia, discitis, OM  Allergies  Allergen Reactions  . Acetaminophen Other (See Comments)    Due to Ulcers/ Liver Damage  . Morphine And Related Itching  . Buprenorphine Hcl Itching  . Meperidine Rash    Patient Measurements: Height:  (160 cm) Weight: 160 lb (72.6 kg) IBW/kg (Calculated) : 56.9   Vital Signs: Temp: 98.5 F (36.9 C) (05/25 1252) Temp Source: Oral (05/25 1252) BP: 151/96 (05/25 1252) Pulse Rate: 89 (05/25 1252) Intake/Output from previous day: No intake/output data recorded. Intake/Output from this shift: No intake/output data recorded.  Labs: Recent Labs    06/03/17 1152 06/04/17 0447  WBC 7.5 6.5  HGB 12.5* 12.1*  PLT 256 240  CREATININE 0.97 0.90   Estimated Creatinine Clearance: 95.6 mL/min (by C-G formula based on SCr of 0.9 mg/dL). No results for input(s): VANCOTROUGH, VANCOPEAK, VANCORANDOM, GENTTROUGH, GENTPEAK, GENTRANDOM, TOBRATROUGH, TOBRAPEAK, TOBRARND, AMIKACINPEAK, AMIKACINTROU, AMIKACIN in the last 72 hours.   Microbiology: Recent Results (from the past 720 hour(s))  Blood culture (routine x 2)     Status: Abnormal   Collection Time: 05/17/17  8:30 AM  Result Value Ref Range Status   Specimen Description BLOOD RIGHT HAND  Final   Special Requests   Final    BOTTLES DRAWN AEROBIC AND ANAEROBIC Blood Culture adequate volume   Culture  Setup Time   Final    GRAM POSITIVE COCCI Gram Stain Report Called to,Read Back By and Verified With: NURSE ODUNTAN NOTIFIED AT 0328 ON 05/18/2017 BY EVA IN BOTH AEROBIC AND ANAEROBIC BOTTLES Performed at [facility] AEROBIC AND ANAEROBIC BOTTLES    Culture VIRIDANS STREPTOCOCCUS (A)  Final   Report Status 05/20/2017 FINAL  Final   Organism ID, Bacteria VIRIDANS STREPTOCOCCUS  Final      Susceptibility   Viridans streptococcus - MIC*    PENICILLIN <=0.06 SENSITIVE Sensitive      CEFTRIAXONE 0.5 SENSITIVE Sensitive     ERYTHROMYCIN 2 RESISTANT Resistant     LEVOFLOXACIN >=16 RESISTANT Resistant     VANCOMYCIN 0.5 SENSITIVE Sensitive     * VIRIDANS STREPTOCOCCUS  Blood Culture ID Panel (Reflexed)     Status: Abnormal   Collection Time: 05/17/17  8:30 AM  Result Value Ref Range Status   Enterococcus species NOT DETECTED NOT DETECTED Final   Listeria monocytogenes NOT DETECTED NOT DETECTED Final   Staphylococcus species NOT DETECTED NOT DETECTED Final   Staphylococcus aureus NOT DETECTED NOT DETECTED Final   Streptococcus species DETECTED (A) NOT DETECTED Final    Comment: Not Enterococcus species, Streptococcus agalactiae, Streptococcus pyogenes, or Streptococcus pneumoniae. CRITICAL RESULT CALLED TO, READ BACK BY AND VERIFIED WITH: Patrick North PharmD 10:00 05/18/17 ([email or web address])    Streptococcus agalactiae NOT DETECTED NOT DETECTED Final   Streptococcus pneumoniae NOT DETECTED NOT DETECTED Final   Streptococcus pyogenes NOT DETECTED NOT DETECTED Final   Acinetobacter baumannii NOT DETECTED NOT DETECTED Final   Enterobacteriaceae species NOT DETECTED NOT DETECTED Final   Enterobacter cloacae complex NOT DETECTED NOT DETECTED Final   Escherichia coli NOT DETECTED NOT DETECTED Final   Klebsiella oxytoca NOT DETECTED NOT DETECTED Final   Klebsiella pneumoniae NOT DETECTED NOT DETECTED Final   Proteus species NOT DETECTED NOT DETECTED Final   Serratia marcescens NOT DETECTED NOT DETECTED Final   Haemophilus influenzae NOT DETECTED NOT DETECTED Final   Neisseria meningitidis NOT DETECTED NOT DETECTED Final   Pseudomonas aeruginosa NOT DETECTED NOT DETECTED  Final   Candida albicans NOT DETECTED NOT DETECTED Final   Candida glabrata NOT DETECTED NOT DETECTED Final   Candida krusei NOT DETECTED NOT DETECTED Final   Candida parapsilosis NOT DETECTED NOT DETECTED Final   Candida tropicalis NOT DETECTED NOT DETECTED Final  Blood culture (routine x 2)     Status: Abnormal    Collection Time: 05/17/17  8:42 AM  Result Value Ref Range Status   Specimen Description   Final    BLOOD LEFT ARM DRAWN BY RN Performed at [facility], [address] [place], [place] [address]    Special Requests   Final    BOTTLES DRAWN AEROBIC AND ANAEROBIC Blood Culture adequate volume Performed at [facility], [address] [place], [place] [address]    Culture  Setup Time   Final    GRAM POSITIVE COCCI Gram Stain Report Called to,Read Back By and Verified With: NURSE ODUNTAN AT 0441 ON 05/18/2017 BY EVA ANAEROBIC BOTTLE ONLY Performed at [facility] IN BOTH AEROBIC AND ANAEROBIC BOTTLES    Culture (A)  Final    VIRIDANS STREPTOCOCCUS SUSCEPTIBILITIES PERFORMED ON PREVIOUS CULTURE WITHIN THE LAST 5 DAYS. Performed at [facility], [address] [place], [place] [address]    Report Status 05/20/2017 FINAL  Final  MRSA PCR Screening     Status: Abnormal   Collection Time: 05/18/17  6:37 AM  Result Value Ref Range Status   MRSA by PCR POSITIVE (A) NEGATIVE Final    Comment:        The GeneXpert MRSA Assay (FDA approved for NASAL specimens only), is one component of a comprehensive MRSA colonization surveillance program. It is not intended to diagnose MRSA infection nor to guide or monitor treatment for MRSA infections. RESULT CALLED TO, READ BACK BY AND VERIFIED WITH: RN Milbert Coulter [id number] [phone] MLM Performed at [facility], [address] [place], [place] [address]   Culture, blood (routine x 2)     Status: None   Collection Time: 05/19/17  9:50 AM  Result Value Ref Range Status   Specimen Description BLOOD LEFT ANTECUBITAL  Final   Special Requests   Final    BOTTLES DRAWN AEROBIC ONLY Blood Culture adequate volume   Culture   Final    NO GROWTH 5 DAYS Performed at [facility], [address] [place], [place] [address]    Report Status 05/24/2017 FINAL  Final  Culture, blood (routine x 2)     Status: None   Collection Time:  05/19/17  9:55 AM  Result Value Ref Range Status   Specimen Description BLOOD RIGHT HAND  Final   Special Requests   Final    BOTTLES DRAWN AEROBIC ONLY Blood Culture adequate volume   Culture   Final    NO GROWTH 5 DAYS Performed at [facility], [address], [place], [place] [address]    Report Status 05/24/2017 FINAL  Final  Culture, blood (routine x 2)     Status: None (Preliminary result)   Collection Time: 06/03/17 11:59 AM  Result Value Ref Range Status   Specimen Description BLOOD RIGHT ARM  Final   Special Requests   Final    BOTTLES DRAWN AEROBIC AND ANAEROBIC Blood Culture adequate volume   Culture   Final    NO GROWTH < 24 HOURS Performed at [facility], [address], [place], [place] [address]    Report Status PENDING  Incomplete  Culture,  blood (routine x 2)     Status: None (Preliminary result)   Collection Time: 06/03/17 12:02 PM  Result Value Ref Range Status   Specimen Description BLOOD RIGHT ARM  Final   Special Requests   Final    BOTTLES DRAWN AEROBIC AND ANAEROBIC Blood Culture adequate volume   Culture   Final    NO GROWTH < 24 HOURS Performed at [facility], [address] [place], [place] [address]    Report Status PENDING  Incomplete  Urine culture     Status: None   Collection Time: 06/03/17 12:20 PM  Result Value Ref Range Status   Specimen Description   Final    URINE, CLEAN CATCH Performed at [facility], [address], [place], [place] [address]    Special Requests   Final    NONE Performed at [facility], [address], [place], [place] [address]    Culture   Final    NO GROWTH Performed at [facility], [address] [place], [place] [address]    Report Status 06/04/2017 FINAL  Final    Medical History: Past Medical History:  Diagnosis Date  . ADHD (attention deficit hyperactivity disorder)   . Bipolar 1 disorder (HCC)   . Chronic back pain   . Chronic pain    chronic l leg pain  . DDD (degenerative disc  disease), lumbar   . Degenerative disc disease, lumbar   . Depression   . GERD (gastroesophageal reflux disease)   . Hepatitis C   . Hepatitis C   . History of stomach ulcers   . Sciatica   . Substance abuse [facility])     Assessment: 42 yo male readmitted with discitis/OM with epidural phlegmon and repeat strep viridans bacteremia. Case discussed with ID by Dr. Lowell Guitar. Will initiate PCN for strep viridans (previously susceptible). Pharmacy asked to dose.    Plan:  Penicillin G  3 million units IV q4h Monitor fever curve, WBC, cultures and clinical course Monitor renal function  Keeley Sussman A. Jeanella Craze, PharmD, BCPS Clinical Pharmacist [place] Pager: [phone]  06/04/2017,2:36 PM

## 2017-06-04 NOTE — Progress Notes (Signed)
PROGRESS NOTE    JOSHUE BADAL  ZOX:[id number] DOB: 1975/10/24 DOA: 06/04/2017 PCP: Patient, No Pcp Per   Brief Narrative:  MILFORD CILENTO is Cristine Daw 42 y.o. male with medical history significant for bipolar disorder, chronic low back pain, tobacco abuse, hepatitis C, polysubstance abuse disorder with IV drug abuse, and GERD who was recently discharged from [facility] on 05/26/2017 after treatment for acute discitis/osteomyelitis in his lumbar spine along with strep viridans bacteremia with plans to continue amoxicillin for Kendyll Huettner total of 6 weeks.  He was also recently started on Suboxone Darreld Hoffer few days ago due to opioid use disorder.  He presented to the emergency department earlier yesterday with an acute flare of his chronic low back pain that has been ongoing for the last week.  The pain is described as an ache and can be sharp and stabbing at times with radiation to his bilateral knees.  There is no signs of cauda equina with any bowel or bladder incontinence or saddle anesthesia.  He states that he has been compliant with his home antibiotics and has recently used IV cocaine.  He underwent an MRI of his low back earlier today demonstrating severe progressive discitis/osteomyelitis at L2/3 with epidural phlegmonous changes that appeared drainable with increase in size from prior exam.  ED physician at that time consulted with neurosurgery Dr. Jordan Likes at [facility] who reviewed his MRI images with no acute surgical issue noted at this time.  Discussion was also had with ID physician Dr. Drue Second who recommended withholding antibiotics at this time with transfer to [facility] for IR consult and aspiration.  It appears that patient's pain was not well controlled and became upset and left AMA.  He has now returned with unrelenting pain and is agreeable for transfer. He denies any fevers, chills, focal motor weakness, numbness, or tingling in his extremities.  Assessment & Plan:   Principal Problem:   Acute osteomyelitis  of lumbar spine (HCC) Active Problems:   Chronic hepatitis C virus genotype 1a infection (HCC)   Opioid use disorder, moderate, dependence (HCC)   GERD (gastroesophageal reflux disease)   Bipolar disorder (HCC)   Discitis   Osteomyelitis (HCC)    1. Severe progressive L2/3 discitis/osteomyelitis with epidural phlegmon.  Recommended for transfer to [facility] for drainage by IR as well as further evaluation by ID.  Blood cultures currently pending and recommendations are to withhold antibiotics until drainage has taken place.  Prior history of Streptococcus viridans bacteremia noted.  Pain control with Toradol and dilaudid ordered as well.  Continue suboxone (may need higher doses of opiates due to this).  Enema for constipation.  1. Discussed with ID who recommended with hx of bacteremia to start antibiotics 2. IR c/s for biopsy or drain abscess  2. Mild hypokalemia.  Orally replete and check magnesium level.  3. Mild hyponatremia.  Initiate gentle normal saline, time limited.  Recheck Devani Odonnel.m. Labs.  4. GERD.  PPI.  5. Depression/bipolar.  Continue Cymbalta and Seroquel.  DVT prophylaxis: SCD Code Status: full  Family Communication: none at bedside Disposition Plan: pending further eval   Consultants:   ID  IR  Procedures:   none  Antimicrobials:  Anti-infectives (From admission, onward)   Start     Dose/Rate Route Frequency Ordered Stop   06/04/17 1600  penicillin G potassium 3 Million Units in dextrose 50mL IVPB     3 Million Units 100 mL/hr over 30 Minutes Intravenous Every 4 hours 06/04/17 1443  Subjective: C/o back pain.   Objective: Vitals:   06/04/17 0219 06/04/17 0222 06/04/17 1115 06/04/17 1252  BP:  (!) 152/89 (!) 143/97 (!) 151/96  Pulse:  (!) 110 (!) 105 89  Resp:  Temp:  98.7 F (37.1 C) 98.9 F (37.2 C) 98.5 F (36.9 C)  TempSrc:   Oral Oral  SpO2:  100% 100% 100%  Weight: 72.6 kg (160 lb)     Height:  (1.6 m)       No intake or output data in the 24 hours ending 06/04/17 2107 Filed Weights   06/04/17 0219  Weight: 72.6 kg (160 lb)    Examination:  General exam: Appears calm and comfortable  Respiratory system: Clear to auscultation. Respiratory effort normal. Cardiovascular system: S1 & S2 heard, RRR. No JVD, murmurs, rubs, gallops or clicks. No pedal edema. Gastrointestinal system: Abdomen is nondistended, soft and nontender. No organomegaly or masses felt. Normal bowel sounds heard. Central nervous system: Alert and oriented. No focal neurological deficits. Back pain Extremities: Symmetric 5 x 5 power. Skin: No rashes, lesions or ulcers Psychiatry: Judgement and insight appear normal. Mood & affect appropriate.     Data Reviewed: I have personally reviewed following labs and imaging studies  CBC: Recent Labs  Lab 06/03/17 1152 06/04/17 0447  WBC 7.5 6.5  NEUTROABS 4.8  --   HGB 12.5* 12.1*  HCT 38.7* 37.9*  MCV 94.9 95.0  PLT 256 240   Basic Metabolic Panel: Recent Labs  Lab 06/03/17 1152 06/04/17 0447  NA 131*  --   K 3.4*  --   CL 97*  --   CO2 27  --   GLUCOSE 188*  --   BUN 21*  --   CREATININE 0.97 0.90  CALCIUM 9.4  --   MG  --  1.7   GFR: Estimated Creatinine Clearance: 95.6 mL/min (by C-G formula based on SCr of 0.9 mg/dL). Liver Function Tests: No results for input(s): AST, ALT, ALKPHOS, BILITOT, PROT, ALBUMIN in the last 168 hours. No results for input(s): LIPASE, AMYLASE in the last 168 hours. No results for input(s): AMMONIA in the last 168 hours. Coagulation Profile: Recent Labs  Lab 06/03/17 1152  INR 1.07   Cardiac Enzymes: No results for input(s): CKTOTAL, CKMB, CKMBINDEX, TROPONINI in the last 168 hours. BNP (last 3 results) No results for input(s): PROBNP in the last 8760 hours. HbA1C: No results for input(s): HGBA1C in the last 72 hours. CBG: Recent Labs  Lab 05/31/17 1156 06/04/17 0809 06/04/17 1345  GLUCAP 113* 177* 119*    Lipid Profile: No results for input(s): CHOL, HDL, LDLCALC, TRIG, CHOLHDL, LDLDIRECT in the last 72 hours. Thyroid Function Tests: No results for input(s): TSH, T4TOTAL, FREET4, T3FREE, THYROIDAB in the last 72 hours. Anemia Panel: No results for input(s): VITAMINB12, FOLATE, FERRITIN, TIBC, IRON, RETICCTPCT in the last 72 hours. Sepsis Labs: Recent Labs  Lab 06/03/17 1152 06/03/17 1408  LATICACIDVEN 1.4 0.9    Recent Results (from the past 240 hour(s))  Culture, blood (routine x 2)     Status: None (Preliminary result)   Collection Time: 06/03/17 11:59 AM  Result Value Ref Range Status   Specimen Description BLOOD RIGHT ARM  Final   Special Requests   Final    BOTTLES DRAWN AEROBIC AND ANAEROBIC Blood Culture adequate volume   Culture   Final    NO GROWTH < 24 HOURS Performed at [facility], [address] [place], [place] [address]  Report Status PENDING  Incomplete  Culture, blood (routine x 2)     Status: None (Preliminary result)   Collection Time: 06/03/17 12:02 PM  Result Value Ref Range Status   Specimen Description BLOOD RIGHT ARM  Final   Special Requests   Final    BOTTLES DRAWN AEROBIC AND ANAEROBIC Blood Culture adequate volume   Culture   Final    NO GROWTH < 24 HOURS Performed at [facility], [address], [place], [place] [address]    Report Status PENDING  Incomplete  Urine culture     Status: None   Collection Time: 06/03/17 12:20 PM  Result Value Ref Range Status   Specimen Description   Final    URINE, CLEAN CATCH Performed at [facility], [address], [place], [place] [address]    Special Requests   Final    NONE Performed at [facility], [address], [place], [place] [address]    Culture   Final    NO GROWTH Performed at [facility], [address] [place], [place] [address]    Report Status 06/04/2017 FINAL  Final         Radiology Studies: Mr Lumbar Spine W Wo Contrast (assess For Abscess, Cord  Compression)  Result Date: 06/03/2017 CLINICAL DATA:  History of discitis and osteomyelitis. Severe back pain. EXAM: MRI LUMBAR SPINE WITHOUT AND WITH CONTRAST TECHNIQUE: Multiplanar and multiecho pulse sequences of the lumbar spine were obtained without and with intravenous contrast. CONTRAST:  14mL MULTIHANCE GADOBENATE DIMEGLUMINE 529 MG/ML IV SOLN COMPARISON:  05/17/2017 FINDINGS: Segmentation:  Standard. Alignment:  Physiologic. Vertebrae: Schmorl's node along the superior endplate of L1. Mild chronic anterior L1 vertebral body height loss. Severe edema and enhancement throughout L2 and L3 vertebral bodies with severe progressive disc height loss and endplate irregularity most consistent with discitis-osteomyelitis. Mild epidural soft tissue enhancement along the posterior margin of the L2 and L3 vertebral bodies likely reflecting phlegmonous changes. Epidural enhancement circumferentially at the level of L2-3 likely postinflammatory. Small 7 x 5 mm cystic structure along the left paracentral posterior aspect of L2-3 disc with peripheral enhancement which may reflect Rex Oesterle disc protrusion versus Bristyl Mclees small epidural. Soft tissue edema in the paraspinal soft tissues with enhancement consistent with phlegmonous changes without Inocencio Roy drainable fluid collection. Conus medullaris and cauda equina: Conus extends to the L1 level. Conus and cauda equina appear normal. Paraspinal and other soft tissues: No other paraspinal abnormalities. Disc levels: Disc spaces: Degenerative disc disease with disc height loss at T12-L1 and L2-3. T12-L1: No significant disc bulge. No evidence of neural foraminal stenosis. No central canal stenosis. L1-L2: Mild broad-based disc bulge. No foraminal or central canal stenosis. No evidence of neural foraminal stenosis. No central canal stenosis. L2-L3: Small 7 x 5 mm cystic structure along the left paracentral posterior aspect of L2-3 disc with peripheral enhancement which may reflect Shequita Peplinski disc  protrusion versus Winslow Ederer small epidural. Mild bilateral facet arthropathy. No evidence of neural foraminal stenosis. No central canal stenosis. L3-L4: No significant disc bulge. No evidence of neural foraminal stenosis. No central canal stenosis. L4-L5: No significant disc bulge. No evidence of neural foraminal stenosis. No central canal stenosis. L5-S1: No significant disc bulge. No evidence of neural foraminal stenosis. No central canal stenosis. IMPRESSION: 1. Severe progressive discitis-osteomyelitis at L2-3 with epidural phlegmonous changes along the posterior margin of the L2 and L3 vertebral bodies. Circumferential epidural enhancement which is likely inflammatory and similar in appearance to the prior exam.  Severe paraspinal inflammatory changes without Tiearra Colwell drainable fluid collection most consistent with Tyaisha Cullom phlegmon which has increased compared with the prior exam. Small 7 x 5 mm cystic structure along the left paracentral posterior aspect of L2-3 disc with peripheral enhancement which may reflect Katrena Stehlin new disc protrusion versus more likely Laconda Basich small epidural. Electronically Signed   By: Elige Ko   On: 06/03/2017 14:11        Scheduled Meds: . buprenorphine-naloxone  1 tablet Sublingual BID  . enoxaparin (LOVENOX) injection  40 mg Subcutaneous Q24H  . gabapentin  300 mg Oral TID  . insulin aspart  0-15 Units Subcutaneous TID WC  . insulin aspart  0-5 Units Subcutaneous QHS  . methocarbamol  500 mg Oral TID  . nicotine  21 mg Transdermal Daily  . pantoprazole  40 mg Oral Daily   Continuous Infusions: . pencillin G potassium IV 3 Million Units (06/04/17 1647)     LOS: 0 days    Time spent: over 30 min    Lacretia Nicks, MD [facility] Pager [phone]  If 7PM-7AM, please contact night-coverage [email or web address] Password [email or web address] 06/04/2017, 9:07 PM

## 2017-06-04 NOTE — ED Provider Notes (Signed)
[facility] EMERGENCY DEPARTMENT Provider Note   CSN: [id number] Arrival date & time: 06/04/17  0217     History   Chief Complaint Chief Complaint  Patient presents with  . Back Pain    HPI Joshua Thomas is a 42 y.o. male.  Patient is a 42 year old male with past medical history of bipolar, ADHD, IV drug abuse, and discitis/osteomyelitis of the spine.  He presents today complaining of severe back pain.  He was seen earlier today and underwent multiple tests including an MRI of the lumbar spine.  Consultation was made with neurosurgery, infectious disease, and arrangements were made through the hospitalist to have the patient admitted at [facility].  Apparently the patient was displeased with his pain control while in the emergency department, then signed out AGAINST MEDICAL ADVICE and went home.  He returns today stating that his pain is too severe and he cannot function or ambulate.  He states that he is now willing to be admitted.  He denies any fevers or chills.  He denies any weakness in his legs or bowel or bladder complaints.     Past Medical History:  Diagnosis Date  . ADHD (attention deficit hyperactivity disorder)   . Bipolar 1 disorder (HCC)   . Chronic back pain   . Chronic pain    chronic l leg pain  . DDD (degenerative disc disease), lumbar   . Degenerative disc disease, lumbar   . Depression   . GERD (gastroesophageal reflux disease)   . Hepatitis C   . Hepatitis C   . History of stomach ulcers   . Sciatica   . Substance abuse [facility])     Patient Active Problem List   Diagnosis Date Noted  . Hyperglycemia 06/03/2017  . Discitis   . Streptococcal bacteremia   . GERD (gastroesophageal reflux disease) 05/17/2017  . Bipolar disorder (HCC) 05/17/2017  . Acute osteomyelitis of lumbar spine (HCC) 05/17/2017  . Cocaine abuse with cocaine-induced mood disorder (HCC) 01/20/2017  . Benzodiazepine abuse (HCC) 01/20/2017  . Cocaine use disorder, moderate,  dependence (HCC) 05/23/2015  . Opioid use disorder, moderate, dependence (HCC) 05/23/2015  . 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 Substance or medication-induced bipolar and related disorder with onset during intoxication (HCC) 05/23/2015  . Chronic hepatitis C virus genotype 1a infection (HCC)   . 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 FRACTURE SURGERY    . HARDWARE REMOVAL  01/27/2012   Procedure: HARDWARE REMOVAL;  Surgeon: Kathryne Hitch, MD;  Location: [facility] ORS;  Service: Orthopedics;  Laterality: Left;  . TIBIA IM NAIL INSERTION  01/27/2012   Procedure: INTRAMEDULLARY (IM) NAIL TIBIAL;  Surgeon: Kathryne Hitch, MD;  Location: [facility] ORS;  Service: Orthopedics;  Laterality: Left;  Removal of IM Rod and Screws Left Tibia with Exchange Nail, Allograft Bone Graft Left Tibia        Home Medications    Prior to Admission medications   Medication Sig Start Date End Date Taking? Authorizing Provider  amoxicillin (AMOXIL) 500 MG capsule Take 1 capsule (500 mg total) by mouth 3 (three) times daily. 05/26/17 07/10/17  Amin, Loura Halt, MD  buprenorphine-naloxone (SUBOXONE) 8-2 mg SUBL SL tablet Place 1 tablet under the tongue 2 (two) times daily. 05/31/17   Gust Rung, DO  gabapentin (NEURONTIN) 300 MG capsule Take 1 capsule (300 mg total) by mouth 3 (three) times daily. Patient  not taking: Reported on 06/03/2017 05/26/17 06/25/17  Dimple Nanas, MD  gabapentin (NEURONTIN) 400 MG capsule Take 1,200 mg by mouth 2 (two) times daily.    [provider]  methocarbamol (ROBAXIN) 500 MG tablet Take 500 mg by mouth 3 (three) times daily.    [provider]  Oxycodone HCl 10 MG TABS Take 10 mg by mouth every 6 (six) hours as needed (back pain).    [provider]  pantoprazole (PROTONIX) 40 MG tablet Take 1 tablet (40 mg total) by mouth daily. Patient not  taking: Reported on 06/03/2017 05/27/17 06/26/17  Dimple Nanas, MD  polyethylene glycol (MIRALAX / GLYCOLAX) packet Take 17 g by mouth daily as needed for up to 15 doses for severe constipation. Patient not taking: Reported on 06/03/2017 05/26/17   Dimple Nanas, MD  senna-docusate (SENOKOT-S) 8.6-50 MG tablet Take 1 tablet by mouth at bedtime as needed for up to 15 doses for mild constipation. 05/26/17   Amin, Loura Halt, MD  traZODone (DESYREL) 50 MG tablet Take 1 tablet (50 mg total) by mouth at bedtime as needed for up to 15 doses for sleep. 05/26/17   Dimple Nanas, MD    Family History Family History  Problem Relation Age of Onset  . Diabetes Mother     Social History Social History   Tobacco Use  . Smoking status: Current Every Day Smoker    Years: 24.00    Types: E-cigarettes  . Smokeless tobacco: Former Neurosurgeon    Quit date: 08/08/2006  Substance Use Topics  . Alcohol use: Yes    Comment: occasionally  . Drug use: Yes    Types: Cocaine, IV    Comment: last used 6-7 days ago     Allergies   Acetaminophen; Morphine and related; Buprenorphine hcl; and Meperidine   Review of Systems Review of Systems  All other systems reviewed and are negative.    Physical Exam Updated Vital Signs BP (!) 152/89 (BP Location: Left Arm)   Pulse (!) 110   Temp 98.7 F (37.1 C)   Resp 20   Ht  (1.6 m)   Wt 72.6 kg (160 lb)   SpO2 100%   BMI 28.34 kg/m   Physical Exam  Constitutional: He is oriented to person, place, and time. He appears well-developed and well-nourished. No distress.  HENT:  Head: Normocephalic and atraumatic.  Neck: Normal range of motion. Neck supple.  Pulmonary/Chest: Effort normal.  Abdominal: Soft.  Neurological: He is alert and oriented to person, place, and time.  Skin: Skin is warm and dry. He is not diaphoretic.  Nursing note and vitals reviewed.    ED Treatments / Results  Labs (all labs ordered are listed, but only abnormal  results are displayed) Labs Reviewed - No data to display  EKG None  Radiology Mr Lumbar Spine W Wo Contrast (assess For Abscess, Cord Compression)  Result Date: 06/03/2017 CLINICAL DATA:  History of discitis and osteomyelitis. Severe back pain. EXAM: MRI LUMBAR SPINE WITHOUT AND WITH CONTRAST TECHNIQUE: Multiplanar and multiecho pulse sequences of the lumbar spine were obtained without and with intravenous contrast. CONTRAST:  14mL MULTIHANCE GADOBENATE DIMEGLUMINE 529 MG/ML IV SOLN COMPARISON:  05/17/2017 FINDINGS: Segmentation:  Standard. Alignment:  Physiologic. Vertebrae: Schmorl's node along the superior endplate of L1. Mild chronic anterior L1 vertebral body height loss. Severe edema and enhancement throughout L2 and L3 vertebral bodies with severe progressive disc height loss and endplate irregularity most consistent with discitis-osteomyelitis.  Mild epidural soft tissue enhancement along the posterior margin of the L2 and L3 vertebral bodies likely reflecting phlegmonous changes. Epidural enhancement circumferentially at the level of L2-3 likely postinflammatory. Small 7 x 5 mm cystic structure along the left paracentral posterior aspect of L2-3 disc with peripheral enhancement which may reflect a disc protrusion versus a small epidural. Soft tissue edema in the paraspinal soft tissues with enhancement consistent with phlegmonous changes without a drainable fluid collection. Conus medullaris and cauda equina: Conus extends to the L1 level. Conus and cauda equina appear normal. Paraspinal and other soft tissues: No other paraspinal abnormalities. Disc levels: Disc spaces: Degenerative disc disease with disc height loss at T12-L1 and L2-3. T12-L1: No significant disc bulge. No evidence of neural foraminal stenosis. No central canal stenosis. L1-L2: Mild broad-based disc bulge. No foraminal or central canal stenosis. No evidence of neural foraminal stenosis. No central canal stenosis. L2-L3: Small 7 x  5 mm cystic structure along the left paracentral posterior aspect of L2-3 disc with peripheral enhancement which may reflect a disc protrusion versus a small epidural. Mild bilateral facet arthropathy. No evidence of neural foraminal stenosis. No central canal stenosis. L3-L4: No significant disc bulge. No evidence of neural foraminal stenosis. No central canal stenosis. L4-L5: No significant disc bulge. No evidence of neural foraminal stenosis. No central canal stenosis. L5-S1: No significant disc bulge. No evidence of neural foraminal stenosis. No central canal stenosis. IMPRESSION: 1. Severe progressive discitis-osteomyelitis at L2-3 with epidural phlegmonous changes along the posterior margin of the L2 and L3 vertebral bodies. Circumferential epidural enhancement which is likely inflammatory and similar in appearance to the prior exam. Severe paraspinal inflammatory changes without a drainable fluid collection most consistent with a phlegmon which has increased compared with the prior exam. Small 7 x 5 mm cystic structure along the left paracentral posterior aspect of L2-3 disc with peripheral enhancement which may reflect a new disc protrusion versus more likely a small epidural. Electronically Signed   By: Elige Ko   On: 06/03/2017 14:11    Procedures Procedures (including critical care time)  Medications Ordered in ED Medications - No data to display   Initial Impression / Assessment and Plan / ED Course  I have reviewed the triage vital signs and the nursing notes.  Pertinent labs & imaging results that were available during my care of the patient were reviewed by me and considered in my medical decision making (see chart for details).  Patient seen yesterday for severe back pain related to osteomyelitis of the lumbar spine.  He had thorough work-up including MRI and laboratory studies.  The plan was for him to be admitted, however he left AMA secondary to perceived lack of adequate pain  control.  He returns now agreeing to be admitted.  I have reviewed Dr. Clarene Duke' note.  Her conversations with consultants were well-documented.  I have discussed the care with Dr. Sherryll Burger from the hospitalist service who will make arrangements for the patient to be admitted at [facility] as recommended by infectious disease.  Final Clinical Impressions(s) / ED Diagnoses   Final diagnoses:  None    ED Discharge Orders    None       Geoffery Lyons, MD 06/04/17 [phone]

## 2017-06-04 NOTE — ED Notes (Signed)
Pt c/o pain. Pt asked this nurse if he could get something for pain or if he should wait because he knows that he will need it for the ambulance ride to [facility]. This nurse spoke with dr.shah and per dr Sherryll Burger give 650 mg tylenol and 500 mg robaxin when [facility] is on the way to pick pt up to take to [facility].

## 2017-06-04 NOTE — ED Triage Notes (Addendum)
Pt in by [facility] for lower back pain, seen here earlier today and was recommended to be transferred to [facility], but pt refused. Pt returns now due to pain.

## 2017-06-04 NOTE — ED Notes (Signed)
Spoke with Robin at [facility] reqarding rm assignment at [facility].  Per Zella Ball, "she's waiting on charge nurse to call her back with bed approval".  Nurse informed.

## 2017-06-04 NOTE — Progress Notes (Signed)
Patient admitted to the hospital earlier this morning by Dr. Sherryll Burger  Patient seen and examined. Laying in bed, appears comfortable. Lungs are clear and heart sounds normal. He has good strength in his lower extremities bilaterally.  He has been admitted to the hospital with worsening lower back pain and found to have severe progressive L2/L3 discitis/osteomyelitis with epidural phlegmon. He was recently treated for discitis and discharged from [facility] on 5/16. Case has been discussed with neurosurgery who does not feel that acute intervention is needed at this time. Case also discussed by EDP with ID who recommends transfer to [facility] for IR evaluation for aspiration. Antibiotics are being held until IR evaluation. At this time he appears comfortable.   [employer]

## 2017-06-05 DIAGNOSIS — F1490 Cocaine use, unspecified, uncomplicated: Secondary | ICD-10-CM

## 2017-06-05 DIAGNOSIS — F1190 Opioid use, unspecified, uncomplicated: Secondary | ICD-10-CM

## 2017-06-05 DIAGNOSIS — M4626 Osteomyelitis of vertebra, lumbar region: Principal | ICD-10-CM

## 2017-06-05 DIAGNOSIS — Z8619 Personal history of other infectious and parasitic diseases: Secondary | ICD-10-CM

## 2017-06-05 DIAGNOSIS — F17290 Nicotine dependence, other tobacco product, uncomplicated: Secondary | ICD-10-CM

## 2017-06-05 DIAGNOSIS — Z885 Allergy status to narcotic agent status: Secondary | ICD-10-CM

## 2017-06-05 DIAGNOSIS — G061 Intraspinal abscess and granuloma: Secondary | ICD-10-CM

## 2017-06-05 DIAGNOSIS — Z888 Allergy status to other drugs, medicaments and biological substances status: Secondary | ICD-10-CM

## 2017-06-05 DIAGNOSIS — B182 Chronic viral hepatitis C: Secondary | ICD-10-CM

## 2017-06-05 DIAGNOSIS — Z886 Allergy status to analgesic agent status: Secondary | ICD-10-CM

## 2017-06-05 DIAGNOSIS — M4646 Discitis, unspecified, lumbar region: Secondary | ICD-10-CM

## 2017-06-05 LAB — BASIC METABOLIC PANEL
Anion gap: 8 (ref 5–15)
BUN: 14 mg/dL (ref 6–20)
CO2: 26 mmol/L (ref 22–32)
CREATININE: 0.93 mg/dL (ref 0.61–1.24)
Calcium: 9.5 mg/dL (ref 8.9–10.3)
Chloride: 102 mmol/L (ref 101–111)
GFR calc Af Amer: >60 mL/min (ref >60)
GLUCOSE: 104 mg/dL — AB (ref 65–99)
Potassium: 4.5 mmol/L (ref 3.5–5.1)
Sodium: 136 mmol/L (ref 135–145)

## 2017-06-05 LAB — CBC
HCT: 39.1 % (ref 39.0–52.0)
Hemoglobin: 12.5 g/dL — ABNORMAL LOW (ref 13.0–17.0)
MCH: 30 pg (ref 26.0–34.0)
MCHC: 32 g/dL (ref 30.0–36.0)
MCV: 94 fL (ref 78.0–100.0)
Platelets: 233 10*3/uL (ref 150–400)
RBC: 4.16 MIL/uL — ABNORMAL LOW (ref 4.22–5.81)
RDW: 12.6 % (ref 11.5–15.5)
WBC: 5.9 10*3/uL (ref 4.0–10.5)

## 2017-06-05 LAB — GLUCOSE, CAPILLARY
GLUCOSE-CAPILLARY: 110 mg/dL — AB (ref 65–99)
GLUCOSE-CAPILLARY: 149 mg/dL — AB (ref 65–99)
Glucose-Capillary: 109 mg/dL — ABNORMAL HIGH (ref 65–99)
Glucose-Capillary: 152 mg/dL — ABNORMAL HIGH (ref 65–99)

## 2017-06-05 LAB — MAGNESIUM: MAGNESIUM: 1.9 mg/dL (ref 1.7–2.4)

## 2017-06-05 MED ORDER — LIDOCAINE 5 % EX PTCH
1.0000 | MEDICATED_PATCH | CUTANEOUS | Status: DC
  Administered 2017-06-05 – 2017-06-23 (×9): 1 via TRANSDERMAL
  Filled 2017-06-05 (×19): qty 1

## 2017-06-05 MED ORDER — ENOXAPARIN SODIUM 40 MG/0.4ML ~~LOC~~ SOLN: 40.0000 mg | SUBCUTANEOUS | Status: DC

## 2017-06-05 MED ORDER — BENZONATATE 100 MG PO CAPS: 100.0000 mg | ORAL_CAPSULE | Freq: Three times a day (TID) | ORAL | Status: DC | PRN

## 2017-06-05 MED ORDER — OXYCODONE HCL 5 MG PO TABS
10.0000 mg | ORAL_TABLET | ORAL | Status: DC | PRN
  Administered 2017-06-06 – 2017-06-07 (×8): 10 mg via ORAL
  Filled 2017-06-05 (×8): qty 2

## 2017-06-05 NOTE — Plan of Care (Signed)
  Problem: Education: Goal: Knowledge of General Education information will improve Outcome: Progressing   

## 2017-06-05 NOTE — Progress Notes (Signed)
PROGRESS NOTE    Joshua Thomas  WUJ:[id number] DOB: 01/04/1976 DOA: 06/04/2017 PCP: Patient, No Pcp Per   Brief Narrative:  Joshua Thomas is a 42 y.o. male with medical history significant for bipolar disorder, chronic low back pain, tobacco abuse, hepatitis C, polysubstance abuse disorder with IV drug abuse, and GERD who was recently discharged from [facility] on 05/26/2017 after treatment for acute discitis/osteomyelitis in his lumbar spine along with strep viridans bacteremia with plans to continue amoxicillin for a total of 6 weeks.  He was also recently started on Suboxone a few days ago due to opioid use disorder.  He presented to the emergency department earlier yesterday with an acute flare of his chronic low back pain that has been ongoing for the last week.  The pain is described as an ache and can be sharp and stabbing at times with radiation to his bilateral knees.  There is no signs of cauda equina with any bowel or bladder incontinence or saddle anesthesia.  He states that he has been compliant with his home antibiotics and has recently used IV cocaine.  He underwent an MRI of his low back earlier today demonstrating severe progressive discitis/osteomyelitis at L2/3 with epidural phlegmonous changes that appeared drainable with increase in size from prior exam.  ED physician at that time consulted with neurosurgery Dr. Jordan Likes at [facility] who reviewed his MRI images with no acute surgical issue noted at this time.  Discussion was also had with ID physician Dr. Drue Second who recommended withholding antibiotics at this time with transfer to [facility] for IR consult and aspiration.  It appears that patient's pain was not well controlled and became upset and left AMA.  He has now returned with unrelenting pain and is agreeable for transfer. He denies any fevers, chills, focal motor weakness, numbness, or tingling in his extremities.  Assessment & Plan:   Principal Problem:   Acute osteomyelitis  of lumbar spine (HCC) Active Problems:   Chronic hepatitis C virus genotype 1a infection (HCC)   Opioid use disorder, moderate, dependence (HCC)   GERD (gastroesophageal reflux disease)   Bipolar disorder (HCC)   Discitis   Osteomyelitis (HCC)    1. Severe progressive L2/3 discitis/osteomyelitis with epidural phlegmonous changes  Recent Viridans Strep Bacteremia 1. See MRI report 2. Repeat blood cx NGTD 3. ID c/s recommending penicillin for recent viridans strep bacteremia.  Recommending parenteral therapy rather than d/c on oral antibiotics.  Continue suboxone.   4. Pt will eventually need IR guided drainage, but will need general anesthesia for this per IR  5. Dilaudid, oxy, toradol, kpad, lidocaine patch.  He's on suboxone for opiate dependence.  Bowel regimen. Recommended for transfer to [facility] for drainage by IR as well as further evaluation by ID.  Blood cultures currently pending and recommendations are to withhold antibiotics until drainage has taken place.  Prior history of Streptococcus viridans bacteremia noted.  Pain control with Toradol and dilaudid ordered as well.  Continue suboxone (may need higher doses of opiates due to this).  Enema for constipation.   2. Mild hypokalemia.  improved, follow   3. Mild hyponatremia.  improved, follow  4. GERD.  PPI.  5. Depression/bipolar.  Continue Cymbalta and Seroquel. 6. Opiate Dependence: continue suboxone, follow outpatient 7. Hepatitis C: outpatient follow up  DVT prophylaxis: SCD Code Status: full  Family Communication: none at bedside Disposition Plan: pending further eval   Consultants:   ID  IR  Procedures:  none  Antimicrobials:  Anti-infectives (From admission, onward)   Start     Dose/Rate Route Frequency Ordered Stop   06/04/17 1600  penicillin G potassium 3 Million Units in dextrose 50mL IVPB     3 Million Units 100 mL/hr over 30 Minutes Intravenous Every 4 hours 06/04/17 1443            Subjective: C/o back pain.   Objective: Vitals:   06/04/17 1115 06/04/17 1252 06/05/17 0606 06/05/17 1349  BP: (!) 143/97 (!) 151/96 (!) 136/93 (!) 143/98  Pulse: (!) 105 89 98 95  Resp: (!) 22  Temp: 98.9 F (37.2 C) 98.5 F (36.9 C) 98.2 F (36.8 C) 98 F (36.7 C)  TempSrc: Oral Oral Oral Oral  SpO2: 100% 100% 97% 99%  Weight:      Height:        Intake/Output Summary (Last 24 hours) at 06/05/2017 1502 Last data filed at 06/05/2017 0900 Gross per 24 hour  Intake 240 ml  Output -  Net 240 ml   Filed Weights   06/04/17 0219  Weight: 72.6 kg (160 lb)    Examination:  General: appears uncomfortable with back pain  Cardiovascular: Heart sounds show a regular rate, and rhythm. No gallops or rubs. No murmurs. No JVD. Lungs: Clear to auscultation bilaterally with good air movement. No rales, rhonchi or wheezes. Abdomen: Soft, nontender, nondistended with normal active bowel sounds. No masses. No hepatosplenomegaly. Neurological: Alert and oriented 3. Moves all extremities 4 . Cranial nerves II through XII grossly intact. C/o back pain.  No midline ttp on my exam today Skin: Warm and dry. No rashes or lesions. Extremities: No clubbing or cyanosis. No edema.  Psychiatric: Mood and affect are normal. Insight and judgment are appropriate.   Data Reviewed: I have personally reviewed following labs and imaging studies  CBC: Recent Labs  Lab 06/03/17 1152 06/04/17 0447 06/05/17 0615  WBC 7.5 6.5 5.9  NEUTROABS 4.8  --   --   HGB 12.5* 12.1* 12.5*  HCT 38.7* 37.9* 39.1  MCV 94.9 95.0 94.0  PLT 256 240 233   Basic Metabolic Panel: Recent Labs  Lab 06/03/17 1152 06/04/17 0447 06/05/17 0615  NA 131*  --  136  K 3.4*  --  4.5  CL 97*  --  102  CO2 27  --  26  GLUCOSE 188*  --  104*  BUN 21*  --  14  CREATININE 0.97 0.90 0.93  CALCIUM 9.4  --  9.5  MG  --  1.7 1.9   GFR: Estimated Creatinine Clearance: 92.5 mL/min (by C-G formula based on SCr  of 0.93 mg/dL). Liver Function Tests: No results for input(s): AST, ALT, ALKPHOS, BILITOT, PROT, ALBUMIN in the last 168 hours. No results for input(s): LIPASE, AMYLASE in the last 168 hours. No results for input(s): AMMONIA in the last 168 hours. Coagulation Profile: Recent Labs  Lab 06/03/17 1152  INR 1.07   Cardiac Enzymes: No results for input(s): CKTOTAL, CKMB, CKMBINDEX, TROPONINI in the last 168 hours. BNP (last 3 results) No results for input(s): PROBNP in the last 8760 hours. HbA1C: No results for input(s): HGBA1C in the last 72 hours. CBG: Recent Labs  Lab 06/04/17 0809 06/04/17 1345 06/04/17 2134 06/05/17 0646 06/05/17 1231  GLUCAP 177* 119* 112* 109* 152*   Lipid Profile: No results for input(s): CHOL, HDL, LDLCALC, TRIG, CHOLHDL, LDLDIRECT in the last 72 hours. Thyroid Function Tests: No results for input(s): TSH, T4TOTAL,  FREET4, T3FREE, THYROIDAB in the last 72 hours. Anemia Panel: No results for input(s): VITAMINB12, FOLATE, FERRITIN, TIBC, IRON, RETICCTPCT in the last 72 hours. Sepsis Labs: Recent Labs  Lab 06/03/17 1152 06/03/17 1408  LATICACIDVEN 1.4 0.9    Recent Results (from the past 240 hour(s))  Culture, blood (routine x 2)     Status: None (Preliminary result)   Collection Time: 06/03/17 11:59 AM  Result Value Ref Range Status   Specimen Description BLOOD RIGHT ARM  Final   Special Requests   Final    BOTTLES DRAWN AEROBIC AND ANAEROBIC Blood Culture adequate volume   Culture   Final    NO GROWTH 2 DAYS Performed at [facility], [address] [place], [place] [address]    Report Status PENDING  Incomplete  Culture, blood (routine x 2)     Status: None (Preliminary result)   Collection Time: 06/03/17 12:02 PM  Result Value Ref Range Status   Specimen Description BLOOD RIGHT ARM  Final   Special Requests   Final    BOTTLES DRAWN AEROBIC AND ANAEROBIC Blood Culture adequate volume   Culture   Final    NO GROWTH 2 DAYS Performed at  [facility], [address], [place], [place] [address]    Report Status PENDING  Incomplete  Urine culture     Status: None   Collection Time: 06/03/17 12:20 PM  Result Value Ref Range Status   Specimen Description   Final    URINE, CLEAN CATCH Performed at [facility], [address] [place], [place] [address]    Special Requests   Final    NONE Performed at [facility], [address], [place], [place] [address]    Culture   Final    NO GROWTH Performed at [facility], [address] [place], [place] [address]    Report Status 06/04/2017 FINAL  Final         Radiology Studies: No results found.      Scheduled Meds: . buprenorphine-naloxone  1 tablet Sublingual BID  . [START ON 06/06/2017] enoxaparin (LOVENOX) injection  40 mg Subcutaneous Q24H  . gabapentin  300 mg Oral TID  . insulin aspart  0-15 Units Subcutaneous TID WC  . insulin aspart  0-5 Units Subcutaneous QHS  . lidocaine  1 patch Transdermal Q24H  . methocarbamol  500 mg Oral TID  . nicotine  21 mg Transdermal Daily  . pantoprazole  40 mg Oral Daily  . polyethylene glycol  17 g Oral BID   Continuous Infusions: . pencillin G potassium IV 3 Million Units (06/05/17 1039)     LOS: 1 day    Time spent: over 30 min    Lacretia Nicks, MD [facility] Pager [phone]  If 7PM-7AM, please contact night-coverage [email or web address] Password [email or web address] 06/05/2017, 3:02 PM

## 2017-06-05 NOTE — Consult Note (Addendum)
Date of Admission:  06/04/2017          Reason for Consult: Worsening vertebral osteomyelitis and discitis with phlegmon   Referring Provider: Dr. Lowell Guitar   Assessment:  1. Severe progressive discitis at lumbar 2 and 3 spine with an epidural phlegmonous changes along the posterior margin and circumferential epidural enhancement 2. Paraspinal inflammation consistent with early phlegmon that is developed 3. Small 7 x 5 cm area concerning for infection on the L2-L3 disc 4. PWID with active heroin use, recently on Suboxone by Dr. Ninetta Lights 5. Recent viridans group streptococcal bacteremia 6. Chronic Hep C   Plan:  1. Continue penicillin to cover his recent viridans streptococcal bacteremia 2. Case discussed with Dr. Bonnielee Haff and pt will not YET be able to have IR guided procedure due to need for anesthesia 3. I would recommend he stay in house to get parenteral therapy rather than be DC on oral antibiotics 4. Will engage with suboxone (Dr. Ninetta Lights as outpatient 5. Would like to get his HCV treated  Principal Problem:   Acute osteomyelitis of lumbar spine (HCC) Active Problems:   Chronic hepatitis C virus genotype 1a infection (HCC)   Opioid use disorder, moderate, dependence (HCC)   GERD (gastroesophageal reflux disease)   Bipolar disorder (HCC)   Discitis   Osteomyelitis (HCC)   Scheduled Meds: . buprenorphine-naloxone  1 tablet Sublingual BID  . [START ON 06/06/2017] enoxaparin (LOVENOX) injection  40 mg Subcutaneous Q24H  . gabapentin  300 mg Oral TID  . insulin aspart  0-15 Units Subcutaneous TID WC  . insulin aspart  0-5 Units Subcutaneous QHS  . methocarbamol  500 mg Oral TID  . nicotine  21 mg Transdermal Daily  . pantoprazole  40 mg Oral Daily  . polyethylene glycol  17 g Oral BID   Continuous Infusions: . pencillin G potassium IV 3 Million Units (06/05/17 1039)   PRN Meds:.acetaminophen **OR** acetaminophen, benzonatate, HYDROmorphone (DILAUDID) injection,  ketorolac, ondansetron **OR** ondansetron (ZOFRAN) IV, senna-docusate, sodium phosphate, traZODone  HPI: Joshua Thomas is a 42 y.o. male with known IV drug use who was admitted with recent viridans group streptococcal bacteremia and discitis in his lumbar area early May..  He was treated inpatient with ceftriaxone and then transitioned to oral amoxicillin at discharge.  Returns with severe lower back pain despite being on amoxicillin per his report.  MRI was performed and shows:  1. Severe progressive discitis-osteomyelitis at L2-3 with epidural phlegmonous changes along the posterior margin of the L2 and L3 vertebral bodies. Circumferential epidural enhancement which is likely inflammatory and similar in appearance to the prior exam. Severe paraspinal inflammatory changes without a drainable fluid collection most consistent with a phlegmon which has increased compared with the prior exam. Small 7 x 5 mm cystic structure along the left paracentral posterior aspect of L2-3 disc with peripheral enhancement which may reflect a new disc protrusion versus more likely a small epidural.  Recommendations were initially made to withhold antibiotics of the IR guided aspirate could be performed to obtain cultures.  However when I learned that he had had recent streptococcal bacteremia that had not been completed treated meant I did not feel comfortable having him off antibiotics and therefore placed him on IV penicillin.  I still think he would benefit from an IR guided drainage of 1 of these phlegmonous areas and the disc space.  I appreciate though that his requirements for pain control will necessitate general anesthesia for this to be done  and this cannot be done until Tuesday.  Dr. Drue Second is back tomorrow.  Review of Systems: Review of Systems  Constitutional: Positive for diaphoresis and malaise/fatigue. Negative for chills, fever and weight loss.  HENT: Negative for congestion, hearing loss,  sore throat and tinnitus.   Eyes: Negative for blurred vision and double vision.  Respiratory: Negative for cough, sputum production, shortness of breath and wheezing.   Cardiovascular: Negative for chest pain, palpitations and leg swelling.  Gastrointestinal: Negative for abdominal pain, blood in stool, constipation, diarrhea, heartburn, melena, nausea and vomiting.  Genitourinary: Negative for dysuria, flank pain and hematuria.  Musculoskeletal: Positive for back pain and myalgias. Negative for falls and joint pain.  Skin: Negative for itching and rash.  Neurological: Positive for dizziness. Negative for sensory change, focal weakness, loss of consciousness, weakness and headaches.  Endo/Heme/Allergies: Does not bruise/bleed easily.  Psychiatric/Behavioral: Positive for depression and substance abuse. Negative for memory loss and suicidal ideas. The patient is not nervous/anxious.     Past Medical History:  Diagnosis Date  . ADHD (attention deficit hyperactivity disorder)   . Bipolar 1 disorder (HCC)   . Chronic back pain   . Chronic pain    chronic l leg pain  . DDD (degenerative disc disease), lumbar   . Degenerative disc disease, lumbar   . Depression   . GERD (gastroesophageal reflux disease)   . Hepatitis C   . Hepatitis C   . History of stomach ulcers   . Sciatica   . Substance abuse (HCC)     Social History   Tobacco Use  . Smoking status: Current Every Day Smoker    Years: 24.00    Types: E-cigarettes  . Smokeless tobacco: Former Neurosurgeon    Quit date: 08/08/2006  Substance Use Topics  . Alcohol use: Yes    Comment: occasionally  . Drug use: Yes    Types: Cocaine, IV    Comment: last used 6-7 days ago    Family History  Problem Relation Age of Onset  . Diabetes Mother    Allergies  Allergen Reactions  . Acetaminophen Other (See Comments)    Due to Ulcers/ Liver Damage  . Morphine And Related Itching  . Buprenorphine Hcl Itching  . Meperidine Rash     OBJECTIVE: Blood pressure (!) 136/93, pulse 98, temperature 98.2 F (36.8 C), temperature source Oral, resp. rate 20, height  (1.6 m), weight 160 lb (72.6 kg), SpO2 97 %.  Physical Exam  Constitutional: He is oriented to person, place, and time. He appears well-nourished.  HENT:  Head: Normocephalic and atraumatic.  Mouth/Throat: Oropharynx is clear and moist.  Eyes: Conjunctivae and EOM are normal.  Neck: Normal range of motion. Neck supple.  Cardiovascular: Normal rate, regular rhythm and normal heart sounds. Exam reveals no gallop and no friction rub.  No murmur heard. Pulmonary/Chest: Effort normal and breath sounds normal. No stridor. No respiratory distress. He has no wheezes.  Abdominal: Soft. He exhibits no distension. There is no tenderness.  Musculoskeletal: Normal range of motion. He exhibits no edema.  Neurological: He is alert and oriented to person, place, and time.  Skin: Skin is warm and dry. No rash noted. No erythema.   Unfomfortable moving in bed due to back pain  Lab Results Lab Results  Component Value Date   WBC 5.9 06/05/2017   HGB 12.5 (L) 06/05/2017   HCT 39.1 06/05/2017   MCV 94.0 06/05/2017   PLT 233 06/05/2017    Lab Results  Component Value Date   CREATININE 0.93 06/05/2017   BUN 14 06/05/2017   NA 136 06/05/2017   K 4.5 06/05/2017   CL 102 06/05/2017   CO2 26 06/05/2017    Lab Results  Component Value Date   ALT 51 05/21/2017   AST 34 05/21/2017   ALKPHOS 48 05/21/2017   BILITOT 0.5 05/21/2017     Microbiology: Recent Results (from the past 240 hour(s))  Culture, blood (routine x 2)     Status: None (Preliminary result)   Collection Time: 06/03/17 11:59 AM  Result Value Ref Range Status   Specimen Description BLOOD RIGHT ARM  Final   Special Requests   Final    BOTTLES DRAWN AEROBIC AND ANAEROBIC Blood Culture adequate volume   Culture   Final    NO GROWTH 2 DAYS Performed at [facility], [address],  [place], [place] [address]    Report Status PENDING  Incomplete  Culture, blood (routine x 2)     Status: None (Preliminary result)   Collection Time: 06/03/17 12:02 PM  Result Value Ref Range Status   Specimen Description BLOOD RIGHT ARM  Final   Special Requests   Final    BOTTLES DRAWN AEROBIC AND ANAEROBIC Blood Culture adequate volume   Culture   Final    NO GROWTH 2 DAYS Performed at [facility], [address] [place], [place] [address]    Report Status PENDING  Incomplete  Urine culture     Status: None   Collection Time: 06/03/17 12:20 PM  Result Value Ref Range Status   Specimen Description   Final    URINE, CLEAN CATCH Performed at [facility], [address], [place], [place] [address]    Special Requests   Final    NONE Performed at [facility], [address] [place], [place] [address]    Culture   Final    NO GROWTH Performed at [facility], [address] [place], [place] [address]    Report Status 06/04/2017 FINAL  Final    Acey Lav, MD [facility] [phone] pager  06/05/2017, 11:24 AM

## 2017-06-06 LAB — COMPREHENSIVE METABOLIC PANEL
ALK PHOS: 57 U/L (ref 38–126)
ALT: 37 U/L (ref 17–63)
ANION GAP: 11 (ref 5–15)
AST: 25 U/L (ref 15–41)
Albumin: 2.9 g/dL — ABNORMAL LOW (ref 3.5–5.0)
BILIRUBIN TOTAL: 0.5 mg/dL (ref 0.3–1.2)
BUN: 18 mg/dL (ref 6–20)
CALCIUM: 9.3 mg/dL (ref 8.9–10.3)
CO2: 28 mmol/L (ref 22–32)
CREATININE: 1.03 mg/dL (ref 0.61–1.24)
Chloride: 98 mmol/L — ABNORMAL LOW (ref 101–111)
GFR calc Af Amer: >60 mL/min (ref >60)
GFR calc non Af Amer: >60 mL/min (ref >60)
GLUCOSE: 173 mg/dL — AB (ref 65–99)
Potassium: 3.8 mmol/L (ref 3.5–5.1)
Sodium: 137 mmol/L (ref 135–145)
TOTAL PROTEIN: 7.6 g/dL (ref 6.5–8.1)

## 2017-06-06 LAB — CBC
HCT: 38 % — ABNORMAL LOW (ref 39.0–52.0)
HEMOGLOBIN: 12.2 g/dL — AB (ref 13.0–17.0)
MCH: 30.2 pg (ref 26.0–34.0)
MCHC: 32.1 g/dL (ref 30.0–36.0)
MCV: 94.1 fL (ref 78.0–100.0)
Platelets: 230 10*3/uL (ref 150–400)
RBC: 4.04 MIL/uL — ABNORMAL LOW (ref 4.22–5.81)
RDW: 12.4 % (ref 11.5–15.5)
WBC: 4.9 10*3/uL (ref 4.0–10.5)

## 2017-06-06 LAB — SEDIMENTATION RATE: Sed Rate: 85 mm/hr — ABNORMAL HIGH (ref 0–16)

## 2017-06-06 LAB — MAGNESIUM: Magnesium: 1.8 mg/dL (ref 1.7–2.4)

## 2017-06-06 LAB — GLUCOSE, CAPILLARY
GLUCOSE-CAPILLARY: 208 mg/dL — AB (ref 65–99)
Glucose-Capillary: 142 mg/dL — ABNORMAL HIGH (ref 65–99)
Glucose-Capillary: 105 mg/dL — ABNORMAL HIGH (ref 65–99)
Glucose-Capillary: 173 mg/dL — ABNORMAL HIGH (ref 65–99)

## 2017-06-06 LAB — C-REACTIVE PROTEIN: CRP: 3.7 mg/dL — ABNORMAL HIGH (ref <1.0)

## 2017-06-06 MED ORDER — BUTALBITAL-APAP-CAFFEINE 50-325-40 MG PO TABS
2.0000 | ORAL_TABLET | Freq: Once | ORAL | Status: AC
  Administered 2017-06-06: 2 via ORAL
  Filled 2017-06-06: qty 2

## 2017-06-06 MED ORDER — ENOXAPARIN SODIUM 40 MG/0.4ML ~~LOC~~ SOLN: 40.0000 mg | SUBCUTANEOUS | Status: DC

## 2017-06-06 NOTE — Progress Notes (Signed)
PROGRESS NOTE    Joshua Thomas  WUJ:[id number] DOB: Dec 16, 1975 DOA: 06/04/2017 PCP: Patient, No Pcp Per   Brief Narrative:  Joshua Thomas is Joshua Thomas 42 y.o. male with medical history significant for bipolar disorder, chronic low back pain, tobacco abuse, hepatitis C, polysubstance abuse disorder with IV drug abuse, and GERD who was recently discharged from [facility] on 05/26/2017 after treatment for acute discitis/osteomyelitis in his lumbar spine along with strep viridans bacteremia with plans to continue amoxicillin for Joshua Thomas total of 6 weeks.  He was also recently started on Suboxone Joshua Thomas few days ago due to opioid use disorder.  He presented to the emergency department earlier yesterday with an acute flare of his chronic low back pain that has been ongoing for the last week.  The pain is described as an ache and can be sharp and stabbing at times with radiation to his bilateral knees.  There is no signs of cauda equina with any bowel or bladder incontinence or saddle anesthesia.  He states that he has been compliant with his home antibiotics and has recently used IV cocaine.  He underwent an MRI of his low back earlier today demonstrating severe progressive discitis/osteomyelitis at L2/3 with epidural phlegmonous changes that appeared drainable with increase in size from prior exam.  ED physician at that time consulted with neurosurgery Dr. Jordan Thomas at [facility] who reviewed his MRI images with no acute surgical issue noted at this time.  Discussion was also had with ID physician Dr. Drue Thomas who recommended withholding antibiotics at this time with transfer to [facility] for IR consult and aspiration.  It appears that patient's pain was not well controlled and became upset and left AMA.  He has now returned with unrelenting pain and is agreeable for transfer. He denies any fevers, chills, focal motor weakness, numbness, or tingling in his extremities.  Assessment & Plan:   Principal Problem:   Acute osteomyelitis  of lumbar spine (HCC) Active Problems:   Chronic hepatitis C virus genotype 1a infection (HCC)   Opioid use disorder, moderate, dependence (HCC)   GERD (gastroesophageal reflux disease)   Bipolar disorder (HCC)   Discitis   Osteomyelitis (HCC)    1. Severe progressive L2/3 discitis/osteomyelitis with epidural phlegmonous changes  Recent Viridans Strep Bacteremia 1. See MRI report 2. Repeat blood cx NGTD x 3 days 3. ID c/s recommending penicillin for recent viridans strep bacteremia.  Recommending parenteral therapy rather than d/c on oral antibiotics.  Continue suboxone.   4. Pt will eventually need IR guided drainage, but will need general anesthesia for this per IR  5. Dilaudid, oxy, toradol, kpad, lidocaine patch.  He's on suboxone for opiate dependence.  Bowel regimen. Recommended for transfer to [facility] for drainage by IR as well as further evaluation by ID.  Blood cultures currently pending and recommendations are to withhold antibiotics until drainage has taken place.  Prior history of Streptococcus viridans bacteremia noted.  Pain control with Toradol and dilaudid ordered as well.  Continue suboxone (may need higher doses of opiates due to this).  Enema for constipation.   2. Headache:  He notes occasional HA from time to time like this.  Describes as behind his eyes, points to bilateral temples.  ?Tension headache (vs migraine - he had all lights off and didn't want them on).  Will give dose of fioricet x1.  Could trial HA cocktail if this doesn't work.  Consider head imaging if intractable.   3. Mild hypokalemia.  improved, follow   4. Mild hyponatremia.  improved, follow  5. GERD.  PPI.  6. Depression/bipolar.  Continue Cymbalta and Seroquel. 7. Opiate Dependence: continue suboxone, follow outpatient 8. Hepatitis C: outpatient follow up  DVT prophylaxis: SCD Code Status: full  Family Communication: none at bedside Disposition Plan: pending further  eval   Consultants:   ID  IR  Procedures:   none  Antimicrobials:  Anti-infectives (From admission, onward)   Start     Dose/Rate Route Frequency Ordered Stop   06/04/17 1600  penicillin G potassium 3 Million Units in dextrose 50mL IVPB     3 Million Units 100 mL/hr over 30 Minutes Intravenous Every 4 hours 06/04/17 1443           Subjective: Feeling poorly.  C/o headache.  No vision changes.  Doesn't want lights on.     Objective: Vitals:   06/04/17 1252 06/05/17 0606 06/05/17 1349 06/05/17 2152  BP: (!) 151/96 (!) 136/93 (!) 143/98 (!) 161/113  Pulse: 89 98 95 96  Resp: 20 20 (!) 22   Temp: 98.5 F (36.9 C) 98.2 F (36.8 C) 98 F (36.7 C) 98.6 F (37 C)  TempSrc: Oral Oral Oral Oral  SpO2: 100% 97% 99% 95%  Weight:      Height:        Intake/Output Summary (Last 24 hours) at 06/06/2017 1114 Last data filed at 06/06/2017 0900 Gross per 24 hour  Intake 1170 ml  Output 1100 ml  Net 70 ml   Filed Weights   06/04/17 0219  Weight: 72.6 kg (160 lb)    Examination:  General: appears uncomfortable, laying in dark with headache Cardiovascular: Heart sounds show Joshua Thomas regular rate, and rhythm. No gallops or rubs. No murmurs. No JVD. Lungs: Clear to auscultation bilaterally with good air movement. No rales, rhonchi or wheezes. Abdomen: Soft, nontender, nondistended with normal active bowel sounds. No masses. No hepatosplenomegaly. Neurological: Alert and oriented 3. Moves all extremities 4 with equal strength. Cranial nerves II through XII grossly intact. Skin: Warm and dry. No rashes or lesions. Extremities: No clubbing or cyanosis. No edema. Psychiatric: Mood and affect are normal. Insight and judgment are appropriate.  Data Reviewed: I have personally reviewed following labs and imaging studies  CBC: Recent Labs  Lab 06/03/17 1152 06/04/17 0447 06/05/17 0615 06/06/17 0327  WBC 7.5 6.5 5.9 4.9  NEUTROABS 4.8  --   --   --   HGB 12.5* 12.1* 12.5*  12.2*  HCT 38.7* 37.9* 39.1 38.0*  MCV 94.9 95.0 94.0 94.1  PLT 256 240 233 230   Basic Metabolic Panel: Recent Labs  Lab 06/03/17 1152 06/04/17 0447 06/05/17 0615 06/06/17 0327  NA 131*  --  136 137  K 3.4*  --  4.5 3.8  CL 97*  --  102 98*  CO2 27  --  26 28  GLUCOSE 188*  --  104* 173*  BUN 21*  --  14 18  CREATININE 0.97 0.90 0.93 1.03  CALCIUM 9.4  --  9.5 9.3  MG  --  1.7 1.9 1.8   GFR: Estimated Creatinine Clearance: 83.5 mL/min (by C-G formula based on SCr of 1.03 mg/dL). Liver Function Tests: Recent Labs  Lab 06/06/17 0327  AST 25  ALT 37  ALKPHOS 57  BILITOT 0.5  PROT 7.6  ALBUMIN 2.9*   No results for input(s): LIPASE, AMYLASE in the last 168 hours. No results for input(s): AMMONIA in the last 168 hours. Coagulation Profile:  Recent Labs  Lab 06/03/17 1152  INR 1.07   Cardiac Enzymes: No results for input(s): CKTOTAL, CKMB, CKMBINDEX, TROPONINI in the last 168 hours. BNP (last 3 results) No results for input(s): PROBNP in the last 8760 hours. HbA1C: No results for input(s): HGBA1C in the last 72 hours. CBG: Recent Labs  Lab 06/05/17 0646 06/05/17 1231 06/05/17 1817 06/05/17 2131 06/06/17 0631  GLUCAP 109* 152* 110* 149* 142*   Lipid Profile: No results for input(s): CHOL, HDL, LDLCALC, TRIG, CHOLHDL, LDLDIRECT in the last 72 hours. Thyroid Function Tests: No results for input(s): TSH, T4TOTAL, FREET4, T3FREE, THYROIDAB in the last 72 hours. Anemia Panel: No results for input(s): VITAMINB12, FOLATE, FERRITIN, TIBC, IRON, RETICCTPCT in the last 72 hours. Sepsis Labs: Recent Labs  Lab 06/03/17 1152 06/03/17 1408  LATICACIDVEN 1.4 0.9    Recent Results (from the past 240 hour(s))  Culture, blood (routine x 2)     Status: None (Preliminary result)   Collection Time: 06/03/17 11:59 AM  Result Value Ref Range Status   Specimen Description BLOOD RIGHT ARM  Final   Special Requests   Final    BOTTLES DRAWN AEROBIC AND ANAEROBIC Blood  Culture adequate volume   Culture   Final    NO GROWTH 3 DAYS Performed at [facility], [address] [place], [place] [address]    Report Status PENDING  Incomplete  Culture, blood (routine x 2)     Status: None (Preliminary result)   Collection Time: 06/03/17 12:02 PM  Result Value Ref Range Status   Specimen Description BLOOD RIGHT ARM  Final   Special Requests   Final    BOTTLES DRAWN AEROBIC AND ANAEROBIC Blood Culture adequate volume   Culture   Final    NO GROWTH 3 DAYS Performed at [facility], [address], [place], [place] [address]    Report Status PENDING  Incomplete  Urine culture     Status: None   Collection Time: 06/03/17 12:20 PM  Result Value Ref Range Status   Specimen Description   Final    URINE, CLEAN CATCH Performed at [facility], [address] [place], [place] [address]    Special Requests   Final    NONE Performed at [facility], [address] [place], [place] [address]    Culture   Final    NO GROWTH Performed at [facility], [address] [place], [place] [address]    Report Status 06/04/2017 FINAL  Final         Radiology Studies: No results found.      Scheduled Meds: . buprenorphine-naloxone  1 tablet Sublingual BID  . butalbital-acetaminophen-caffeine  2 tablet Oral Once  . enoxaparin (LOVENOX) injection  40 mg Subcutaneous Q24H  . gabapentin  300 mg Oral TID  . insulin aspart  0-15 Units Subcutaneous TID WC  . insulin aspart  0-5 Units Subcutaneous QHS  . lidocaine  1 patch Transdermal Q24H  . methocarbamol  500 mg Oral TID  . nicotine  21 mg Transdermal Daily  . pantoprazole  40 mg Oral Daily  . polyethylene glycol  17 g Oral BID   Continuous Infusions: . pencillin G potassium IV 3 Million Units (06/06/17 0932)     LOS: 2 days    Time spent: over 30 min    Lacretia Nicks, MD [facility] Pager [phone]  If 7PM-7AM, please contact night-coverage [email or web address] Password  [email or web address] 06/06/2017, 11:14 AM

## 2017-06-06 NOTE — Progress Notes (Signed)
[facility]  Date of Admission:  06/04/2017     Total days of antibiotics 3         ASSESSMENT/PLAN  Joshua Thomas is a 42 y/o male with his of Bipolar, Hep C, and polysubstance abuse previously hospitalized for back pain and found to have discitis/osteomyelitis of the lumbar spine with Viridans strep. Treated with ceftriaxone and discharged on Augmentin. Now with severe progressive discitis and phlegmon development. Awaiting potential IR aspiration. Currently on Day 3 of antimicrobial therapy with penicillin. Has remained afebrile with no leukocytosis. Blood cultures from 5/24 have no growth to date. Disposition remains a concern with history of IVDU and failed oral therapy. Restarted on Suboxone for OUD.   1. Continue current dosage of penicillin. Will require at least 6 weeks of IV therapy.  2. Await potential IR aspiration.  3. Continue suboxone for OUD per primary team.    Principal Problem:   Acute osteomyelitis of lumbar spine (HCC) Active Problems:   Chronic hepatitis C virus genotype 1a infection (HCC)   Opioid use disorder, moderate, dependence (HCC)   GERD (gastroesophageal reflux disease)   Bipolar disorder (HCC)   Discitis   Osteomyelitis (HCC)   . buprenorphine-naloxone  1 tablet Sublingual BID  . [START ON 06/07/2017] enoxaparin (LOVENOX) injection  40 mg Subcutaneous Q24H  . gabapentin  300 mg Oral TID  . insulin aspart  0-15 Units Subcutaneous TID WC  . insulin aspart  0-5 Units Subcutaneous QHS  . lidocaine  1 patch Transdermal Q24H  . methocarbamol  500 mg Oral TID  . nicotine  21 mg Transdermal Daily  . pantoprazole  40 mg Oral Daily  . polyethylene glycol  17 g Oral BID    SUBJECTIVE:  Afebrile overnight. Continues to have back pain. Cultures remain with no growth to date.   Allergies  Allergen Reactions  . Acetaminophen Other (See Comments)    Due to Ulcers/ Liver Damage  . Morphine And Related Itching  .  Buprenorphine Hcl Itching  . Meperidine Rash     Review of Systems: Review of Systems  Constitutional: Negative for chills, diaphoresis and fever.  Respiratory: Negative for cough, shortness of breath and wheezing.   Cardiovascular: Negative for chest pain and leg swelling.  Gastrointestinal: Negative for abdominal pain, constipation, diarrhea, nausea and vomiting.  Musculoskeletal: Positive for back pain.      OBJECTIVE: Vitals:   06/05/17 0606 06/05/17 1349 06/05/17 2152 06/06/17 1225  BP: (!) 136/93 (!) 143/98 (!) 161/113 (!) 144/97  Pulse: 98 95 96 86  Resp: 20 (!) 22  (!) 22  Temp: 98.2 F (36.8 C) 98 F (36.7 C) 98.6 F (37 C)   TempSrc: Oral Oral Oral   SpO2: 97% 99% 95% 100%  Weight:      Height:       Body mass index is 28.34 kg/m.  Physical Exam  Constitutional: He is oriented to person, place, and time. He appears well-developed and well-nourished. No distress.  Cardiovascular: Normal rate, regular rhythm, normal heart sounds and intact distal pulses.  Pulmonary/Chest: Effort normal and breath sounds normal.  Musculoskeletal:  Low back with no obvious deformity, discoloration or edema. Tenderness of the lumbar spine with no creptitus or deformity.   Neurological: He is alert and oriented to person, place, and time.  Skin: Skin is warm and dry.  Psychiatric: He has a normal mood and affect. His behavior is normal. Judgment and thought content normal.  Lab Results Lab Results  Component Value Date   WBC 4.9 06/06/2017   HGB 12.2 (L) 06/06/2017   HCT 38.0 (L) 06/06/2017   MCV 94.1 06/06/2017   PLT 230 06/06/2017    Lab Results  Component Value Date   CREATININE 1.03 06/06/2017   BUN 18 06/06/2017   NA 137 06/06/2017   K 3.8 06/06/2017   CL 98 (L) 06/06/2017   CO2 28 06/06/2017    Lab Results  Component Value Date   ALT 37 06/06/2017   AST 25 06/06/2017   ALKPHOS 57 06/06/2017   BILITOT 0.5 06/06/2017     Microbiology: Recent Results  (from the past 240 hour(s))  Culture, blood (routine x 2)     Status: None (Preliminary result)   Collection Time: 06/03/17 11:59 AM  Result Value Ref Range Status   Specimen Description BLOOD RIGHT ARM  Final   Special Requests   Final    BOTTLES DRAWN AEROBIC AND ANAEROBIC Blood Culture adequate volume   Culture   Final    NO GROWTH 3 DAYS Performed at [facility], [address] [place], [place] [address]    Report Status PENDING  Incomplete  Culture, blood (routine x 2)     Status: None (Preliminary result)   Collection Time: 06/03/17 12:02 PM  Result Value Ref Range Status   Specimen Description BLOOD RIGHT ARM  Final   Special Requests   Final    BOTTLES DRAWN AEROBIC AND ANAEROBIC Blood Culture adequate volume   Culture   Final    NO GROWTH 3 DAYS Performed at [facility], [address], [place], [place] [address]    Report Status PENDING  Incomplete  Urine culture     Status: None   Collection Time: 06/03/17 12:20 PM  Result Value Ref Range Status   Specimen Description   Final    URINE, CLEAN CATCH Performed at [facility], [address], [place], [place] [address]    Special Requests   Final    NONE Performed at [facility], [address], [place], [place] [address]    Culture   Final    NO GROWTH Performed at [facility], [address] [place], [place] [address]    Report Status 06/04/2017 FINAL  Final     Marcos Eke, NP [facility] [phone] Pager  06/06/2017  1:28 PM

## 2017-06-07 ENCOUNTER — Encounter (HOSPITAL_COMMUNITY): Payer: Self-pay | Admitting: Anesthesiology

## 2017-06-07 LAB — COMPREHENSIVE METABOLIC PANEL
ALK PHOS: 52 U/L (ref 38–126)
ALT: 34 U/L (ref 17–63)
AST: 22 U/L (ref 15–41)
Albumin: 2.8 g/dL — ABNORMAL LOW (ref 3.5–5.0)
Anion gap: 8 (ref 5–15)
BILIRUBIN TOTAL: 0.5 mg/dL (ref 0.3–1.2)
BUN: 14 mg/dL (ref 6–20)
CALCIUM: 9.4 mg/dL (ref 8.9–10.3)
CO2: 29 mmol/L (ref 22–32)
CREATININE: 1.12 mg/dL (ref 0.61–1.24)
Chloride: 100 mmol/L — ABNORMAL LOW (ref 101–111)
GFR calc Af Amer: >60 mL/min (ref >60)
Glucose, Bld: 175 mg/dL — ABNORMAL HIGH (ref 65–99)
Potassium: 4.7 mmol/L (ref 3.5–5.1)
Sodium: 137 mmol/L (ref 135–145)
TOTAL PROTEIN: 7.2 g/dL (ref 6.5–8.1)

## 2017-06-07 LAB — HEPATITIS A ANTIBODY, TOTAL: Hep A Total Ab: NEGATIVE

## 2017-06-07 LAB — HEPATITIS B SURFACE ANTIGEN: Hepatitis B Surface Ag: NEGATIVE

## 2017-06-07 LAB — CBC
HEMATOCRIT: 38.8 % — AB (ref 39.0–52.0)
Hemoglobin: 12.3 g/dL — ABNORMAL LOW (ref 13.0–17.0)
MCH: 29.7 pg (ref 26.0–34.0)
MCHC: 31.7 g/dL (ref 30.0–36.0)
MCV: 93.7 fL (ref 78.0–100.0)
PLATELETS: 232 10*3/uL (ref 150–400)
RBC: 4.14 MIL/uL — AB (ref 4.22–5.81)
RDW: 12.5 % (ref 11.5–15.5)
WBC: 4.7 10*3/uL (ref 4.0–10.5)

## 2017-06-07 LAB — GLUCOSE, CAPILLARY
Glucose-Capillary: 173 mg/dL — ABNORMAL HIGH (ref 65–99)
Glucose-Capillary: 118 mg/dL — ABNORMAL HIGH (ref 65–99)
Glucose-Capillary: 194 mg/dL — ABNORMAL HIGH (ref 65–99)
Glucose-Capillary: 120 mg/dL — ABNORMAL HIGH (ref 65–99)

## 2017-06-07 LAB — HEPATITIS B SURFACE ANTIBODY,QUALITATIVE: Hep B S Ab: NONREACTIVE

## 2017-06-07 LAB — MAGNESIUM: MAGNESIUM: 1.9 mg/dL (ref 1.7–2.4)

## 2017-06-07 MED ORDER — HYDROMORPHONE HCL 2 MG PO TABS
4.0000 mg | ORAL_TABLET | ORAL | Status: DC | PRN
  Administered 2017-06-07 – 2017-06-23 (×60): 4 mg via ORAL
  Filled 2017-06-07 (×62): qty 2

## 2017-06-07 MED ORDER — HYDROMORPHONE HCL 2 MG/ML IJ SOLN: 2.0000 mg | Freq: Four times a day (QID) | INTRAMUSCULAR | Status: DC

## 2017-06-07 MED ORDER — HYDROMORPHONE HCL 2 MG PO TABS: 4.0000 mg | ORAL_TABLET | ORAL | Status: DC | PRN

## 2017-06-07 MED ORDER — HYDROMORPHONE HCL 2 MG/ML IJ SOLN
2.0000 mg | Freq: Four times a day (QID) | INTRAMUSCULAR | Status: DC | PRN
  Administered 2017-06-08 – 2017-06-22 (×37): 2 mg via INTRAVENOUS
  Filled 2017-06-07 (×40): qty 1

## 2017-06-07 MED FILL — Penicillin G Potassium Inj 60000 Unit/ML in Dextrose: INTRAVENOUS | Qty: 50 | Status: CN

## 2017-06-07 NOTE — Anesthesia Preprocedure Evaluation (Addendum)
Anesthesia Evaluation  Patient identified by MRN, date of birth, ID band Patient awake    Reviewed: Allergy & Precautions, NPO status , Patient's Chart, lab work & pertinent test results  Airway Mallampati: III       Dental no notable dental hx. (+) Teeth Intact, Dental Advisory Given   Pulmonary neg pulmonary ROS, Current Smoker,    breath sounds clear to auscultation       Cardiovascular negative cardio ROS Normal cardiovascular exam Rhythm:Regular Rate:Normal     Neuro/Psych PSYCHIATRIC DISORDERS Depression Bipolar Disorder ADHD Neuromuscular disease    GI/Hepatic GERD  Medicated and Controlled,(+)     substance abuse  cocaine use and IV drug use, Hepatitis -, COpioid and benzodiazepine abuse   Endo/Other  negative endocrine ROS  Renal/GU negative Renal ROS  negative genitourinary   Musculoskeletal  (+) Arthritis , Osteoarthritis,  Lumbar osteomyelitis Chronic left leg pain   Abdominal   Peds  Hematology Bacteremia   Anesthesia Other Findings   Reproductive/Obstetrics                            Anesthesia Physical Anesthesia Plan  ASA: III  Anesthesia Plan: General   Post-op Pain Management:    Induction: Intravenous  PONV Risk Score and Plan: 2 and Dexamethasone, Ondansetron and Treatment may vary due to age or medical condition  Airway Management Planned: Oral ETT  Additional Equipment:   Intra-op Plan:   Post-operative Plan: Extubation in OR  Informed Consent: I have reviewed the patients History and Physical, chart, labs and discussed the procedure including the risks, benefits and alternatives for the proposed anesthesia with the patient or authorized representative who has indicated his/her understanding and acceptance.   Dental advisory given  Plan Discussed with: CRNA and Surgeon  Anesthesia Plan Comments:        Anesthesia Quick Evaluation

## 2017-06-07 NOTE — Progress Notes (Signed)
[facility]  Date of Admission:  06/04/2017             ASSESSMENT/PLAN   [name] continues treatment for osteomyelitis/discitis of the lumbar spine with previous treatment failure with oral Augmentin. Penicillin was stopped this morning in attempts to obtain culture with aspiration which is scheduled tomorrow. Will require prolonged course of treatment with disposition to be determined. He has strong desire to be able to complete treatment at home where he will be living with his mother. There remains concern given recent substance use about possibility of relapse, although he is doing well with suboxone currently.  1. Continue to hold antibiotics until after disc aspiration tomorrow. 2. Continue suboxone per primary team.     Principal Problem:   Acute osteomyelitis of lumbar spine (HCC) Active Problems:   Chronic hepatitis C virus genotype 1a infection (HCC)   Opioid use disorder, moderate, dependence (HCC)   GERD (gastroesophageal reflux disease)   Bipolar disorder (HCC)   Discitis   Osteomyelitis (HCC)   . buprenorphine-naloxone  1 tablet Sublingual BID  . enoxaparin (LOVENOX) injection  40 mg Subcutaneous Q24H  . gabapentin  300 mg Oral TID  . insulin aspart  0-15 Units Subcutaneous TID WC  . insulin aspart  0-5 Units Subcutaneous QHS  . lidocaine  1 patch Transdermal Q24H  . methocarbamol  500 mg Oral TID  . nicotine  21 mg Transdermal Daily  . pantoprazole  40 mg Oral Daily  . polyethylene glycol  17 g Oral BID    SUBJECTIVE:  Afebrile overnight with no leukocytosis. Awaiting IR disc aspiration. Penicillin stopped this morning.   Allergies  Allergen Reactions  . Acetaminophen Other (See Comments)    Due to Ulcers/ Liver Damage  . Morphine And Related Itching  . Buprenorphine Hcl Itching  . Meperidine Rash     Review of Systems: Review of Systems  Constitutional: Negative for chills and fever.  Respiratory: Negative for  cough and wheezing.   Cardiovascular: Negative for chest pain and leg swelling.  Gastrointestinal: Negative for abdominal pain, constipation, diarrhea, nausea and vomiting.  Musculoskeletal: Positive for back pain.  Skin: Negative for rash.      OBJECTIVE: Vitals:   06/06/17 1225 06/06/17 2133 06/07/17 0500 06/07/17 1328  BP: (!) 144/97 135/89 131/89 (!) 135/110  Pulse: 86 (!) 102 78 85  Resp: (!) 22   20  Temp: 98.5 F (36.9 C) 98.2 F (36.8 C) 98.2 F (36.8 C) 98.3 F (36.8 C)  TempSrc:  Oral Oral Oral  SpO2: 100% 99% 100% 98%  Weight:      Height:       Body mass index is 28.34 kg/m.  Physical Exam  Constitutional: He appears well-developed and well-nourished. No distress.  Lying in bed with head of bed slightly elevated. Moving around in the bed with improvement.   Cardiovascular: Normal rate, regular rhythm, normal heart sounds and intact distal pulses. Exam reveals no gallop and no friction rub.  No murmur heard. Pulmonary/Chest: Effort normal and breath sounds normal. No stridor. No respiratory distress. He has no wheezes. He has no rales. He exhibits no tenderness.  Abdominal: Soft. Bowel sounds are normal.  Skin: Skin is warm and dry. No rash noted.  Psychiatric: He has a normal mood and affect.    Lab Results Lab Results  Component Value Date   WBC 4.7 06/07/2017   HGB 12.3 (L) 06/07/2017   HCT 38.8 (L) 06/07/2017  MCV 93.7 06/07/2017   PLT 232 06/07/2017    Lab Results  Component Value Date   CREATININE 1.12 06/07/2017   BUN 14 06/07/2017   NA 137 06/07/2017   K 4.7 06/07/2017   CL 100 (L) 06/07/2017   CO2 29 06/07/2017    Lab Results  Component Value Date   ALT 34 06/07/2017   AST 22 06/07/2017   ALKPHOS 52 06/07/2017   BILITOT 0.5 06/07/2017     Microbiology: Recent Results (from the past 240 hour(s))  Culture, blood (routine x 2)     Status: None (Preliminary result)   Collection Time: 06/03/17 11:59 AM  Result Value Ref Range Status    Specimen Description BLOOD RIGHT ARM  Final   Special Requests   Final    BOTTLES DRAWN AEROBIC AND ANAEROBIC Blood Culture adequate volume   Culture   Final    NO GROWTH 4 DAYS Performed at [facility], [address], [place], [place] [address]    Report Status PENDING  Incomplete  Culture, blood (routine x 2)     Status: None (Preliminary result)   Collection Time: 06/03/17 12:02 PM  Result Value Ref Range Status   Specimen Description BLOOD RIGHT ARM  Final   Special Requests   Final    BOTTLES DRAWN AEROBIC AND ANAEROBIC Blood Culture adequate volume   Culture   Final    NO GROWTH 4 DAYS Performed at [facility], [address] [place], [place] [address]    Report Status PENDING  Incomplete  Urine culture     Status: None   Collection Time: 06/03/17 12:20 PM  Result Value Ref Range Status   Specimen Description   Final    URINE, CLEAN CATCH Performed at [facility], [address] [place], [place] [address]    Special Requests   Final    NONE Performed at [facility], [address], [place], [place] [address]    Culture   Final    NO GROWTH Performed at [facility], [address] [place], [place] [address]    Report Status 06/04/2017 FINAL  Final     Marcos Eke, NP [facility] [phone] Pager  06/07/2017  2:43 PM

## 2017-06-07 NOTE — Plan of Care (Signed)

## 2017-06-07 NOTE — Progress Notes (Signed)
PROGRESS NOTE    DEONDRAE Thomas  ZOX:[id number] DOB: 04-Jan-1976 DOA: 06/04/2017 PCP: Patient, No Pcp Per   Brief Narrative:  Joshua Thomas is a 42 y.o. male with medical history significant for bipolar disorder, chronic low back pain, tobacco abuse, hepatitis C, polysubstance abuse disorder with IV drug abuse, and GERD who was recently discharged from [facility] on 05/26/2017 after treatment for acute discitis/osteomyelitis in his lumbar spine along with strep viridans bacteremia with plans to continue amoxicillin for a total of 6 weeks.  He was also recently started on Suboxone a few days ago due to opioid use disorder.  He presented to the emergency department earlier yesterday with an acute flare of his chronic low back pain that has been ongoing for the last week.  The pain is described as an ache and can be sharp and stabbing at times with radiation to his bilateral knees.  There is no signs of cauda equina with any bowel or bladder incontinence or saddle anesthesia.  He states that he has been compliant with his home antibiotics and has recently used IV cocaine.  He underwent an MRI of his low back earlier today demonstrating severe progressive discitis/osteomyelitis at L2/3 with epidural phlegmonous changes that appeared drainable with increase in size from prior exam.  ED physician at that time consulted with neurosurgery Dr. Jordan Likes at [facility] who reviewed his MRI images with no acute surgical issue noted at this time.  Discussion was also had with ID physician Dr. Drue Second who recommended withholding antibiotics at this time with transfer to [facility] for IR consult and aspiration.  It appears that patient's pain was not well controlled and became upset and left AMA.  He has now returned with unrelenting pain and is agreeable for transfer. He denies any fevers, chills, focal motor weakness, numbness, or tingling in his extremities.  Assessment & Plan:   Principal Problem:   Acute osteomyelitis  of lumbar spine (HCC) Active Problems:   Chronic hepatitis C virus genotype 1a infection (HCC)   Opioid use disorder, moderate, dependence (HCC)   GERD (gastroesophageal reflux disease)   Bipolar disorder (HCC)   Discitis   Osteomyelitis (HCC)    1. Severe progressive L2/3 discitis/osteomyelitis with epidural phlegmonous changes  Recent Viridans Strep Bacteremia 1. See MRI report 2. Repeat blood cx NGTD x 4 days 3. ID c/s recommending penicillin for recent viridans strep bacteremia.  Recommending parenteral therapy rather than d/c on oral antibiotics.  Continue suboxone.  Penicillin currently being held per ID.   4. Hopefully IR to aspirate tomorrow on 5/29 5. Dilaudid PO with IV for breakthrough, toradol, kpad, lidocaine patch.  He's on suboxone for opiate dependence.  Bowel regimen.   2. Headache:  Improved with fioricet.  Continue to monitor.   3. Mild hypokalemia.  improved, follow   4. Mild hyponatremia.  improved, follow  5. GERD.  PPI.  6. Depression/bipolar.  Continue Cymbalta and Seroquel. 7. Opiate Dependence: continue suboxone, follow outpatient 8. Hepatitis C: outpatient follow up  DVT prophylaxis: SCD Code Status: full  Family Communication: mothre at bedside Disposition Plan: pending further eval   Consultants:   ID  IR  Procedures:   none  Antimicrobials:  Anti-infectives (From admission, onward)   Start     Dose/Rate Route Frequency Ordered Stop   06/04/17 1600  penicillin G potassium 3 Million Units in dextrose 50mL IVPB  Status:  Discontinued     3 Million Units 100 mL/hr over 30 Minutes  Intravenous Every 4 hours 06/04/17 1443 06/07/17 0925         Subjective: HA better after medicine. Lots of questions about imaging.  Objective: Vitals:   06/06/17 1225 06/06/17 2133 06/07/17 0500 06/07/17 1328  BP: (!) 144/97 135/89 131/89 (!) 135/110  Pulse: 86 (!) 102 78 85  Resp: (!) 22   20  Temp: 98.5 F (36.9 C) 98.2 F (36.8 C) 98.2 F  (36.8 C) 98.3 F (36.8 C)  TempSrc:  Oral Oral Oral  SpO2: 100% 99% 100% 98%  Weight:      Height:        Intake/Output Summary (Last 24 hours) at 06/07/2017 1913 Last data filed at 06/07/2017 1500 Gross per 24 hour  Intake 480 ml  Output 2500 ml  Net -2020 ml   Filed Weights   06/04/17 0219  Weight: 72.6 kg (160 lb)    Examination:  General: No acute distress. Cardiovascular: Heart sounds show a regular rate, and rhythm. No gallops or rubs. No murmurs. No JVD. Lungs: Clear to auscultation bilaterally with good air movement. No rales, rhonchi or wheezes. Abdomen: Soft, nontender, nondistended with normal active bowel sounds. No masses. No hepatosplenomegaly. Neurological: Alert and oriented 3. Moves all extremities 4. Cranial nerves II through XII grossly intact. Skin: Warm and dry. No rashes or lesions. Extremities: No clubbing or cyanosis. No edema.  Psychiatric: Mood and affect are normal. Insight and judgment are appropriate.   Data Reviewed: I have personally reviewed following labs and imaging studies  CBC: Recent Labs  Lab 06/03/17 1152 06/04/17 0447 06/05/17 0615 06/06/17 0327 06/07/17 0332  WBC 7.5 6.5 5.9 4.9 4.7  NEUTROABS 4.8  --   --   --   --   HGB 12.5* 12.1* 12.5* 12.2* 12.3*  HCT 38.7* 37.9* 39.1 38.0* 38.8*  MCV 94.9 95.0 94.0 94.1 93.7  PLT 256 240 233 230 232   Basic Metabolic Panel: Recent Labs  Lab 06/03/17 1152 06/04/17 0447 06/05/17 0615 06/06/17 0327 06/07/17 0332  NA 131*  --  136 137 137  K 3.4*  --  4.5 3.8 4.7  CL 97*  --  102 98* 100*  CO2 27  --  GLUCOSE 188*  --  104* 173* 175*  BUN 21*  --  CREATININE 0.97 0.90 0.93 1.03 1.12  CALCIUM 9.4  --  9.5 9.3 9.4  MG  --  1.7 1.9 1.8 1.9   GFR: Estimated Creatinine Clearance: 76.8 mL/min (by C-G formula based on SCr of 1.12 mg/dL). Liver Function Tests: Recent Labs  Lab 06/06/17 0327 06/07/17 0332  AST 25 22  ALT 37 34  ALKPHOS 57 52  BILITOT  0.5 0.5  PROT 7.6 7.2  ALBUMIN 2.9* 2.8*   No results for input(s): LIPASE, AMYLASE in the last 168 hours. No results for input(s): AMMONIA in the last 168 hours. Coagulation Profile: Recent Labs  Lab 06/03/17 1152  INR 1.07   Cardiac Enzymes: No results for input(s): CKTOTAL, CKMB, CKMBINDEX, TROPONINI in the last 168 hours. BNP (last 3 results) No results for input(s): PROBNP in the last 8760 hours. HbA1C: No results for input(s): HGBA1C in the last 72 hours. CBG: Recent Labs  Lab 06/06/17 1815 06/06/17 2132 06/07/17 0626 06/07/17 1127 06/07/17 1632  GLUCAP 105* 173* 173* 118* 194*   Lipid Profile: No results for input(s): CHOL, HDL, LDLCALC, TRIG, CHOLHDL, LDLDIRECT in the last 72 hours. Thyroid Function Tests: No results for  input(s): TSH, T4TOTAL, FREET4, T3FREE, THYROIDAB in the last 72 hours. Anemia Panel: No results for input(s): VITAMINB12, FOLATE, FERRITIN, TIBC, IRON, RETICCTPCT in the last 72 hours. Sepsis Labs: Recent Labs  Lab 06/03/17 1152 06/03/17 1408  LATICACIDVEN 1.4 0.9    Recent Results (from the past 240 hour(s))  Culture, blood (routine x 2)     Status: None (Preliminary result)   Collection Time: 06/03/17 11:59 AM  Result Value Ref Range Status   Specimen Description BLOOD RIGHT ARM  Final   Special Requests   Final    BOTTLES DRAWN AEROBIC AND ANAEROBIC Blood Culture adequate volume   Culture   Final    NO GROWTH 4 DAYS Performed at [facility], [address], [place], [place] [address]    Report Status PENDING  Incomplete  Culture, blood (routine x 2)     Status: None (Preliminary result)   Collection Time: 06/03/17 12:02 PM  Result Value Ref Range Status   Specimen Description BLOOD RIGHT ARM  Final   Special Requests   Final    BOTTLES DRAWN AEROBIC AND ANAEROBIC Blood Culture adequate volume   Culture   Final    NO GROWTH 4 DAYS Performed at [facility], [address], [place], [place] [address]    Report Status PENDING   Incomplete  Urine culture     Status: None   Collection Time: 06/03/17 12:20 PM  Result Value Ref Range Status   Specimen Description   Final    URINE, CLEAN CATCH Performed at [facility], [address] [place], [place] [address]    Special Requests   Final    NONE Performed at [facility], [address] [place], [place] [address]    Culture   Final    NO GROWTH Performed at [facility], [address] [place], [place] [address]    Report Status 06/04/2017 FINAL  Final         Radiology Studies: No results found.      Scheduled Meds: . buprenorphine-naloxone  1 tablet Sublingual BID  . gabapentin  300 mg Oral TID  . insulin aspart  0-15 Units Subcutaneous TID WC  . insulin aspart  0-5 Units Subcutaneous QHS  . lidocaine  1 patch Transdermal Q24H  . methocarbamol  500 mg Oral TID  . nicotine  21 mg Transdermal Daily  . pantoprazole  40 mg Oral Daily  . polyethylene glycol  17 g Oral BID   Continuous Infusions:    LOS: 3 days    Time spent: over 30 min    Lacretia Nicks, MD [facility] Pager [phone]  If 7PM-7AM, please contact night-coverage [email or web address] Password [facility] 06/07/2017, 7:13 PM

## 2017-06-07 NOTE — Consult Note (Addendum)
Chief Complaint: Patient was seen in consultation today for acute osteomyelitis of lumbar spine.  Referring Physician(s): Zigmund Daniel.  Supervising Physician: Julieanne Cotton  Patient Status: [facility] - In-pt  History of Present Illness: Joshua Thomas is a 42 y.o. male with a past medical history of substance abuse including IVDU, hepatitis C, GERD, chronic back pain, DDD of lumbar spine, sciatica, bipolar, ADHD, and depression. He presented to ED 06/04/2017 with complaint of low back pain and was diagnosed with acute osteomyelitis of lumbar spine.  MRI lumbar spine 06/03/2017: 1. Severe progressive discitis-osteomyelitis at L2-3 with epidural phlegmonous changes along the posterior margin of the L2 and L3 vertebral bodies. Circumferential epidural enhancement which is likely inflammatory and similar in appearance to the prior exam. Severe paraspinal inflammatory changes without a drainable fluid collection most consistent with a phlegmon which has increased compared with the prior exam. Small 7 x 5 mm cystic structure along the left paracentral posterior aspect of L2-3 disc with peripheral enhancement which may reflect a new disc protrusion versus more likely a small epidural.  IR requested by Dr. Lowell Guitar for possible image-guided lumbar 2/3 disc aspiration. Patient awake and alert laying in bed. Accompanied by wife at bedside. Complains of midline lumbar back pain. States pain has improved since admission. Denies fever, chest pain, dyspnea, abdominal pain, dizziness, numbness/tingling down legs, or bladder/bowel incontinence.  Past Medical History:  Diagnosis Date  . ADHD (attention deficit hyperactivity disorder)   . Bipolar 1 disorder (HCC)   . Chronic back pain   . Chronic pain    chronic l leg pain  . DDD (degenerative disc disease), lumbar   . Degenerative disc disease, lumbar   . Depression   . GERD (gastroesophageal reflux disease)   . Hepatitis C   . Hepatitis C    . History of stomach ulcers   . Sciatica   . Substance abuse [facility])     Past Surgical History:  Procedure Laterality Date  . FRACTURE SURGERY    . HARDWARE REMOVAL  01/27/2012   Procedure: HARDWARE REMOVAL;  Surgeon: Kathryne Hitch, MD;  Location: [facility] ORS;  Service: Orthopedics;  Laterality: Left;  . TIBIA IM NAIL INSERTION  01/27/2012   Procedure: INTRAMEDULLARY (IM) NAIL TIBIAL;  Surgeon: Kathryne Hitch, MD;  Location: [facility] ORS;  Service: Orthopedics;  Laterality: Left;  Removal of IM Rod and Screws Left Tibia with Exchange Nail, Allograft Bone Graft Left Tibia    Allergies: Acetaminophen; Buprenorphine hcl; Meperidine; and Morphine and related  Medications: Prior to Admission medications   Medication Sig Start Date End Date Taking? Authorizing Provider  amoxicillin (AMOXIL) 500 MG capsule Take 1 capsule (500 mg total) by mouth 3 (three) times daily. 05/26/17 07/10/17  Amin, Loura Halt, MD  buprenorphine-naloxone (SUBOXONE) 8-2 mg SUBL SL tablet Place 1 tablet under the tongue 2 (two) times daily. 05/31/17   Gust Rung, DO  gabapentin (NEURONTIN) 300 MG capsule Take 1 capsule (300 mg total) by mouth 3 (three) times daily. Patient not taking: Reported on 06/03/2017 05/26/17 06/25/17  Dimple Nanas, MD  gabapentin (NEURONTIN) 400 MG capsule Take 1,200 mg by mouth 2 (two) times daily.    [provider]  methocarbamol (ROBAXIN) 500 MG tablet Take 500 mg by mouth 3 (three) times daily.    [provider]  Oxycodone HCl 10 MG TABS Take 10 mg by mouth every 6 (six) hours as needed (back pain).    [provider]  pantoprazole (  PROTONIX) 40 MG tablet Take 1 tablet (40 mg total) by mouth daily. Patient not taking: Reported on 06/03/2017 05/27/17 06/26/17  Dimple Nanas, MD  polyethylene glycol (MIRALAX / GLYCOLAX) packet Take 17 g by mouth daily as needed for up to 15 doses for severe constipation. Patient not taking: Reported on 06/03/2017  05/26/17   Dimple Nanas, MD  senna-docusate (SENOKOT-S) 8.6-50 MG tablet Take 1 tablet by mouth at bedtime as needed for up to 15 doses for mild constipation. 05/26/17   Amin, Loura Halt, MD  traZODone (DESYREL) 50 MG tablet Take 1 tablet (50 mg total) by mouth at bedtime as needed for up to 15 doses for sleep. 05/26/17   Dimple Nanas, MD     Family History  Problem Relation Age of Onset  . Diabetes Mother     Social History   Socioeconomic History  . Marital status: Single    Spouse name: Not on file  . Number of children: Not on file  . Years of education: Not on file  . Highest education level: Not on file  Occupational History  . Not on file  Social Needs  . Financial resource strain: Not on file  . Food insecurity:    Worry: Not on file    Inability: Not on file  . Transportation needs:    Medical: Not on file    Non-medical: Not on file  Tobacco Use  . Smoking status: Current Every Day Smoker    Years: 24.00    Types: E-cigarettes  . Smokeless tobacco: Former Neurosurgeon    Quit date: 08/08/2006  Substance and Sexual Activity  . Alcohol use: Yes    Comment: occasionally  . Drug use: Yes    Types: Cocaine, IV    Comment: last used 6-7 days ago  . Sexual activity: Not on file  Lifestyle  . Physical activity:    Days per week: Not on file    Minutes per session: Not on file  . Stress: Not on file  Relationships  . Social connections:    Talks on phone: Not on file    Gets together: Not on file    Attends religious service: Not on file    Active member of club or organization: Not on file    Attends meetings of clubs or organizations: Not on file    Relationship status: Not on file  Other Topics Concern  . Not on file  Social History Narrative  . Not on file     Review of Systems: A 12 point ROS discussed and pertinent positives are indicated in the HPI above.  All other systems are negative.  Review of Systems  Constitutional: Negative for  activity change and fever.  Respiratory: Negative for shortness of breath and wheezing.   Cardiovascular: Negative for chest pain and palpitations.  Gastrointestinal: Negative for abdominal pain.       Negative for bowel incontinence.  Genitourinary:       Negative for bladder incontinence.  Musculoskeletal: Positive for back pain.  Neurological: Negative for dizziness and numbness.    Vital Signs: BP (!) 135/110 (BP Location: Right Arm)   Pulse 85   Temp 98.3 F (36.8 C) (Oral)   Resp 20   Ht  (1.6 m)   Wt 160 lb (72.6 kg)   SpO2 98%   BMI 28.34 kg/m   Physical Exam  Constitutional: He is oriented to person, place, and time. He appears well-developed and well-nourished. No  distress.  Cardiovascular: Normal rate, regular rhythm and normal heart sounds.  No murmur heard. Pulmonary/Chest: Effort normal and breath sounds normal. No respiratory distress. He has no wheezes.  Musculoskeletal:  Moderate tenderness of midline lower back at approximate lumbar region.  Neurological: He is alert and oriented to person, place, and time.  Skin: Skin is warm and dry.  Psychiatric: He has a normal mood and affect. His behavior is normal. Judgment and thought content normal.  Nursing note and vitals reviewed.    MD Evaluation Airway: WNL Heart: WNL Abdomen: WNL Chest/ Lungs: WNL ASA  Classification: 3 Mallampati/Airway Score: One   Imaging: Dg Lumbar Spine Complete  Result Date: 05/15/2017 CLINICAL DATA:  Chronic low back pain beginning last night.  Fall. EXAM: LUMBAR SPINE - COMPLETE 4+ VIEW COMPARISON:  CT abdomen and pelvis 06/15/2016. FINDINGS: Five non rib-bearing lumbar type vertebral bodies are present. Levoconvex curvature of the lumbar spine is centered at L2-3. Remote superior endplate fracture is again noted at L1. Endplate sclerotic changes are noted on the right at L 2-3. There is slight retrolisthesis at L2-3. No acute fracture traumatic subluxation is present.  IMPRESSION: 1. No acute abnormality. 2. 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 3. Remote superior endplate L1 fracture. Electronically Signed   By: Marin Roberts M.D.   On: 05/15/2017 14:20   Mr Lumbar Spine W Wo Contrast (assess For Abscess, Cord Compression)  Result Date: 06/03/2017 CLINICAL DATA:  History of discitis and osteomyelitis. Severe back pain. EXAM: MRI LUMBAR SPINE WITHOUT AND WITH CONTRAST TECHNIQUE: Multiplanar and multiecho pulse sequences of the lumbar spine were obtained without and with intravenous contrast. CONTRAST:  14mL MULTIHANCE GADOBENATE DIMEGLUMINE 529 MG/ML IV SOLN COMPARISON:  05/17/2017 FINDINGS: Segmentation:  Standard. Alignment:  Physiologic. Vertebrae: Schmorl's node along the superior endplate of L1. Mild chronic anterior L1 vertebral body height loss. Severe edema and enhancement throughout L2 and L3 vertebral bodies with severe progressive disc height loss and endplate irregularity most consistent with discitis-osteomyelitis. Mild epidural soft tissue enhancement along the posterior margin of the L2 and L3 vertebral bodies likely reflecting phlegmonous changes. Epidural enhancement circumferentially at the level of L2-3 likely postinflammatory. Small 7 x 5 mm cystic structure along the left paracentral posterior aspect of L2-3 disc with peripheral enhancement which may reflect a disc protrusion versus a small epidural. Soft tissue edema in the paraspinal soft tissues with enhancement consistent with phlegmonous changes without a drainable fluid collection. Conus medullaris and cauda equina: Conus extends to the L1 level. Conus and cauda equina appear normal. Paraspinal and other soft tissues: No other paraspinal abnormalities. Disc levels: Disc spaces: Degenerative disc disease with disc height loss at T12-L1 and L2-3. T12-L1: No significant disc bulge. No evidence of neural foraminal stenosis. No central canal  stenosis. L1-L2: Mild broad-based disc bulge. No foraminal or central canal stenosis. No evidence of neural foraminal stenosis. No central canal stenosis. L2-L3: Small 7 x 5 mm cystic structure along the left paracentral posterior aspect of L2-3 disc with peripheral enhancement which may reflect a disc protrusion versus a small epidural. Mild bilateral facet arthropathy. No evidence of neural foraminal stenosis. No central canal stenosis. L3-L4: No significant disc bulge. No evidence of neural foraminal stenosis. No central canal stenosis. L4-L5: No significant disc bulge. No evidence of neural foraminal stenosis. No central canal stenosis. L5-S1: No significant disc bulge. No evidence of neural foraminal stenosis. No central canal stenosis. IMPRESSION: 1. Severe progressive discitis-osteomyelitis at L2-3  with epidural phlegmonous changes along the posterior margin of the L2 and L3 vertebral bodies. Circumferential epidural enhancement which is likely inflammatory and similar in appearance to the prior exam. Severe paraspinal inflammatory changes without a drainable fluid collection most consistent with a phlegmon which has increased compared with the prior exam. Small 7 x 5 mm cystic structure along the left paracentral posterior aspect of L2-3 disc with peripheral enhancement which may reflect a new disc protrusion versus more likely a small epidural. Electronically Signed   By: Elige Ko   On: 06/03/2017 14:11   Mr Lumbar Spine W Wo Contrast  Result Date: 05/17/2017 CLINICAL DATA:  Low back pain for 3-4 days. No known injury. The patient suffered a fall down steps 05/14/2017. EXAM: MRI LUMBAR SPINE WITHOUT AND WITH CONTRAST TECHNIQUE: Multiplanar and multiecho pulse sequences of the lumbar spine were obtained without and with intravenous contrast. CONTRAST:  15 ml MULTIHANCE GADOBENATE DIMEGLUMINE 529 MG/ML IV SOLN COMPARISON:  CT abdomen and pelvis 06/15/2016 and 02/02/2016. CT lumbar spine 05/17/2017.  FINDINGS: Segmentation:  Standard. Alignment:  Maintained. Vertebrae: Mild, chronic anterior superior endplate compression fracture of L1 is unchanged. There is marrow edema and enhancement throughout the L2 and L3 vertebral bodies. Degenerative endplate signal change is seen at L5-S1. Conus medullaris and cauda equina: Conus extends to the L1 level. Conus and cauda equina appear normal. Paraspinal and other soft tissues: Negative. Disc levels: T10-11 and T11-12 are imaged in the sagittal plane only. Disc bulging is seen at both levels but the central canal and foramina appear open. T12-L1: Negative. L1-2: Shallow disc bulge without central canal or foraminal stenosis. L2-3: There is a shallow disc bulge without central canal or foraminal stenosis. There is paraspinous inflammatory change centered about the disc interspace. No abscess is identified. There is no fluid in the disc interspace. L3-4: Shallow disc bulge and a small left foraminal protrusion. The central canal and right foramen are widely patent. Mild left foraminal narrowing is noted. L4-5: Shallow disc bulge and mild-to-moderate facet degenerative change without central canal or foraminal stenosis. L5-S1: Minimal disc bulge and a small left foraminal protrusion. There is mild left foraminal narrowing. The central canal and right foramen are widely patent. IMPRESSION: Edema and enhancement throughout the L2 and L3 vertebral bodies with surrounding paraspinous inflammatory change most consistent with discitis and osteomyelitis. Negative for abscess or central canal compromise. Chronic L1 anterior, superior endplate compression fracture. Mild degenerative disc disease without central canal stenosis as described above. Electronically Signed   By: Drusilla Kanner M.D.   On: 05/17/2017 07:35   Ct L-spine No Charge  Addendum Date: 05/17/2017   ADDENDUM REPORT: 05/17/2017 05:25 ADDENDUM: Note findings and recommendations on concurrent CT of abdomen and  pelvis indicating haziness of prevertebral soft tissues at the L2-3 level. Electronically Signed   By: Mitzi Hansen M.D.   On: 05/17/2017 05:25   Result Date: 05/17/2017 CLINICAL DATA:  42 y/o M; lower back pain starting 3-4 days ago. Recent fall. EXAM: CT LUMBAR SPINE WITHOUT CONTRAST TECHNIQUE: Multidetector CT imaging of the lumbar spine was performed without intravenous contrast administration. Multiplanar CT image reconstructions were also generated. COMPARISON:  Concurrent and 01/05/2017 CT abdomen and pelvis. FINDINGS: Segmentation: 5 lumbar type vertebrae. Alignment: Mild lumbar levocurvature with apex at L3. Normal lumbar lordosis without listhesis. Vertebrae: Stable mild anterior compression deformity of L1 vertebral body. Stable severe discogenic degenerative changes of L2-3 opposing endplates. No acute fracture or new loss of vertebral body height. Paraspinal  and other soft tissues: Please refer to concurrent CT of the abdomen and pelvis. Disc levels: Moderate L2-3 loss of intervertebral disc space height and multilevel mild loss of intervertebral disc space height. Disc bulges and endplate marginal osteophytes result in mild foraminal stenosis bilaterally at L1 through L4 and left L4-5. There is moderate stenosis at the left L5-S1 neural foramen. No high-grade bony canal stenosis. IMPRESSION: 1. No acute fracture or malalignment. 2. Stable chronic L1 mild anterior compression deformity. 3. Lumbar spondylosis with discogenic degenerative changes greatest at the L2-3 level are stable. Moderate left L5-S1 neural foraminal stenosis. Multilevel mild neural foraminal stenosis. No high-grade bony canal stenosis. Electronically Signed: By: Mitzi Hansen M.D. On: 05/17/2017 03:51   Ct Renal Stone Study  Result Date: 05/17/2017 CLINICAL DATA:  42 year old male with flank pain. Concern for renal stone. EXAM: CT ABDOMEN AND PELVIS WITHOUT CONTRAST TECHNIQUE: Multidetector CT imaging of  the abdomen and pelvis was performed following the standard protocol without IV contrast. COMPARISON:  Lumbar spine radiograph dated 05/15/2017 and CT of the abdomen pelvis dated 01/05/2017 FINDINGS: Evaluation of this exam is limited in the absence of intravenous contrast. Lower chest: There is mild cardiomegaly. There is a 17 mm left lower lobe subpleural nodule as seen on the prior CT follow-up as recommended on the report of the prior CT. No intra-abdominal free air or free fluid. Hepatobiliary: No focal liver abnormality is seen. No gallstones, gallbladder wall thickening, or biliary dilatation. Pancreas: Unremarkable. No pancreatic ductal dilatation or surrounding inflammatory changes. Spleen: Top-normal spleen size. Stable surgical clips in the inferior spleen. Adrenals/Urinary Tract: The adrenal glands are unremarkable. There is a nonobstructing stone in the interpolar aspect of the right kidney measuring 7 mm. There is no hydronephrosis. A 3 mm nonobstructing left renal upper pole calculus and probable additional punctate nonobstructing stones noted. There is no hydronephrosis on either side. Subcentimeter left renal hypodense lesion is not characterized. The visualized ureters and urinary bladder appear unremarkable. Stomach/Bowel: There is no bowel obstruction or active inflammation. The appendix is normal. Vascular/Lymphatic: The abdominal aorta and IVC are grossly unremarkable on this noncontrast CT. No portal venous gas. There is no adenopathy. Reproductive: The prostate and seminal vesicles are grossly unremarkable. No pelvic mass. Other: None Musculoskeletal: There is old compression fracture of the superior endplate of L1 with approximately 40% loss of vertebral body height anteriorly. There is sclerotic changes of the inferior L2 and superior L3 with areas of endplate irregularity at L2-L3. There is haziness of the perivertebral soft tissues primarily at L2-L3. The perivertebral soft tissue  haziness appears more pronounced or new compared to the prior CT. An infectious process involving the spine and disc at this level is not excluded. Further evaluation with MRI without and with contrast is advised. There is no acute fracture. IMPRESSION: 1. There is sclerotic changes primarily at L2-L3 with irregularity of the endplate at this level. There is associated haziness of the perivertebral soft tissues, new or more prominent compared to the prior CT. Findings concerning for an infectious process/spondylo discitis. Further evaluation with MRI without and with contrast is recommended. 2. Degenerative changes of the spine with old L1 superior endplate compression fracture. No acute fracture or subluxation. 3. Bilateral nonobstructing renal calculi measure up to 7 mm in the interpolar aspect of the right kidney. No hydronephrosis. Electronically Signed   By: Elgie Collard M.D.   On: 05/17/2017 04:59    Labs:  CBC: Recent Labs    06/04/17 0447 06/05/17  1610 06/06/17 0327 06/07/17 0332  WBC 6.5 5.9 4.9 4.7  HGB 12.1* 12.5* 12.2* 12.3*  HCT 37.9* 39.1 38.0* 38.8*  PLT 240 233 230 232    COAGS: Recent Labs    05/18/17 0514 06/03/17 1152  INR 1.11 1.07  APTT 31  --     BMP: Recent Labs    06/03/17 1152 06/04/17 0447 06/05/17 0615 06/06/17 0327 06/07/17 0332  NA 131*  --  136 137 137  K 3.4*  --  4.5 3.8 4.7  CL 97*  --  102 98* 100*  CO2 27  --  26 28 29   GLUCOSE 188*  --  104* 173* 175*  BUN 21*  --  14 18 14   CALCIUM 9.4  --  9.5 9.3 9.4  CREATININE 0.97 0.90 0.93 1.03 1.12  GFRNONAA >60 >60 >60 >60 >60  GFRAA >60 >60 >60 >60 >60    LIVER FUNCTION TESTS: Recent Labs    05/19/17 0954 05/21/17 0421 06/06/17 0327 06/07/17 0332  BILITOT 1.0 0.5 0.5 0.5  AST 28 34 25 22  ALT 39 51 37 34  ALKPHOS 32* 48 57 52  PROT 6.3* 6.1* 7.6 7.2  ALBUMIN 2.5* 2.3* 2.9* 2.8*    TUMOR MARKERS: No results for input(s): AFPTM, CEA, CA199, CHROMGRNA in the last 8760  hours.  Assessment and Plan:  Acute osteomyelitis of lumbar spine. Plan for image-guided lumbar 2/3 disc aspiration tomorrow with Dr. Corliss Skains. Patient will be NPO at midnight. Denies fever and WBCs WNL. Lovanox held. INR pending.  Risks and benefits discussed with the patient including, but not limited to bleeding, infection, damage to adjacent structures or low yield requiring additional tests. All of the patient's questions were answered, patient is agreeable to proceed. Consent signed and in chart.  Thank you for this interesting consult.  I greatly enjoyed meeting Joshua Thomas and look forward to participating in their care.  A copy of this report was sent to the requesting provider on this date.  Electronically Signed: Elwin Mocha, PA-C 06/07/2017, 3:59 PM   I spent a total of 20 Minutes in face to face in clinical consultation, greater than 50% of which was counseling/coordinating care for acute osteomyelitis of lumbar spine.

## 2017-06-08 ENCOUNTER — Inpatient Hospital Stay (HOSPITAL_COMMUNITY): Payer: Self-pay

## 2017-06-08 ENCOUNTER — Inpatient Hospital Stay (HOSPITAL_COMMUNITY): Payer: Self-pay | Admitting: Anesthesiology

## 2017-06-08 ENCOUNTER — Encounter (HOSPITAL_COMMUNITY): Payer: Self-pay | Admitting: Anesthesiology

## 2017-06-08 ENCOUNTER — Encounter (HOSPITAL_COMMUNITY)
Admission: EM | Disposition: A | Discharge status: Home / Self Care | Payer: Self-pay | Source: Home / Self Care | Attending: Internal Medicine

## 2017-06-08 HISTORY — PX: RADIOLOGY WITH ANESTHESIA: SHX6223

## 2017-06-08 HISTORY — PX: IR LUMBAR DISC ASPIRATION W/IMG GUIDE: IMG5306

## 2017-06-08 LAB — BASIC METABOLIC PANEL
ANION GAP: 8 (ref 5–15)
BUN: 13 mg/dL (ref 6–20)
CALCIUM: 9.7 mg/dL (ref 8.9–10.3)
CHLORIDE: 98 mmol/L — AB (ref 101–111)
CO2: 31 mmol/L (ref 22–32)
Creatinine, Ser: 0.92 mg/dL (ref 0.61–1.24)
GFR calc non Af Amer: >60 mL/min (ref >60)
Glucose, Bld: 113 mg/dL — ABNORMAL HIGH (ref 65–99)
Potassium: 4.4 mmol/L (ref 3.5–5.1)
Sodium: 137 mmol/L (ref 135–145)

## 2017-06-08 LAB — CULTURE, BLOOD (ROUTINE X 2)
CULTURE: NO GROWTH
Culture: NO GROWTH
SPECIAL REQUESTS: ADEQUATE
SPECIAL REQUESTS: ADEQUATE

## 2017-06-08 LAB — GLUCOSE, CAPILLARY
GLUCOSE-CAPILLARY: 107 mg/dL — AB (ref 65–99)
GLUCOSE-CAPILLARY: 114 mg/dL — AB (ref 65–99)

## 2017-06-08 LAB — CBC
HEMATOCRIT: 40.1 % (ref 39.0–52.0)
HEMOGLOBIN: 12.8 g/dL — AB (ref 13.0–17.0)
MCH: 29.8 pg (ref 26.0–34.0)
MCHC: 31.9 g/dL (ref 30.0–36.0)
MCV: 93.5 fL (ref 78.0–100.0)
Platelets: 222 10*3/uL (ref 150–400)
RBC: 4.29 MIL/uL (ref 4.22–5.81)
RDW: 12.6 % (ref 11.5–15.5)
WBC: 5.1 10*3/uL (ref 4.0–10.5)

## 2017-06-08 LAB — TOXASSURE SELECT,+ANTIDEPR,UR

## 2017-06-08 LAB — PROTIME-INR
INR: 1.09
Prothrombin Time: 14 seconds (ref 11.4–15.2)

## 2017-06-08 LAB — MAGNESIUM: MAGNESIUM: 1.9 mg/dL (ref 1.7–2.4)

## 2017-06-08 SURGERY — IR WITH ANESTHESIA
Anesthesia: General

## 2017-06-08 MED ORDER — SUGAMMADEX SODIUM 200 MG/2ML IV SOLN
INTRAVENOUS | Status: DC | PRN
  Administered 2017-06-08: 290.4 mg via INTRAVENOUS

## 2017-06-08 MED ORDER — MIDAZOLAM HCL 2 MG/2ML IJ SOLN
INTRAMUSCULAR | Status: AC
  Filled 2017-06-08: qty 2

## 2017-06-08 MED ORDER — LIDOCAINE 2% (20 MG/ML) 5 ML SYRINGE
INTRAMUSCULAR | Status: DC | PRN
  Administered 2017-06-08: 80 mg via INTRAVENOUS

## 2017-06-08 MED ORDER — HYDROMORPHONE HCL 2 MG/ML IJ SOLN: 0.3000 mg | INTRAMUSCULAR | Status: DC | PRN

## 2017-06-08 MED ORDER — ONDANSETRON HCL 4 MG/2ML IJ SOLN
INTRAMUSCULAR | Status: DC | PRN
  Administered 2017-06-08: 4 mg via INTRAVENOUS

## 2017-06-08 MED ORDER — ROCURONIUM BROMIDE 10 MG/ML (PF) SYRINGE
PREFILLED_SYRINGE | INTRAVENOUS | Status: DC | PRN
  Administered 2017-06-08: 50 mg via INTRAVENOUS

## 2017-06-08 MED ORDER — PROPOFOL 10 MG/ML IV BOLUS
INTRAVENOUS | Status: DC | PRN
  Administered 2017-06-08: 50 mg via INTRAVENOUS
  Administered 2017-06-08: 250 mg via INTRAVENOUS

## 2017-06-08 MED ORDER — HYDRALAZINE HCL 20 MG/ML IJ SOLN
5.0000 mg | Freq: Once | INTRAMUSCULAR | Status: AC
  Administered 2017-06-08: 5 mg via INTRAVENOUS
  Filled 2017-06-08: qty 1

## 2017-06-08 MED ORDER — LACTATED RINGERS IV SOLN
INTRAVENOUS | Status: DC | PRN
  Administered 2017-06-08 (×2): via INTRAVENOUS

## 2017-06-08 MED ORDER — MIDAZOLAM HCL 5 MG/5ML IJ SOLN
INTRAMUSCULAR | Status: DC | PRN
  Administered 2017-06-08 (×2): 2 mg via INTRAVENOUS

## 2017-06-08 MED ORDER — DEXAMETHASONE SODIUM PHOSPHATE 10 MG/ML IJ SOLN
INTRAMUSCULAR | Status: DC | PRN
  Administered 2017-06-08: 10 mg via INTRAVENOUS

## 2017-06-08 MED ORDER — DEXMEDETOMIDINE HCL IN NACL 200 MCG/50ML IV SOLN
INTRAVENOUS | Status: DC | PRN
  Administered 2017-06-08 (×2): 8 ug via INTRAVENOUS

## 2017-06-08 MED ORDER — KETAMINE HCL 10 MG/ML IJ SOLN
INTRAMUSCULAR | Status: AC
  Filled 2017-06-08: qty 1

## 2017-06-08 MED ORDER — GLYCOPYRROLATE 0.2 MG/ML IV SOSY
PREFILLED_SYRINGE | INTRAVENOUS | Status: DC | PRN
  Administered 2017-06-08: .2 mg via INTRAVENOUS

## 2017-06-08 MED ORDER — ONDANSETRON HCL 4 MG/2ML IJ SOLN: 4.0000 mg | Freq: Once | INTRAMUSCULAR | Status: DC | PRN

## 2017-06-08 MED ORDER — PENICILLIN G POT IN DEXTROSE 60000 UNIT/ML IV SOLN
3.0000 10*6.[IU] | INTRAVENOUS | Status: DC
  Administered 2017-06-08 – 2017-06-09 (×6): 3 10*6.[IU] via INTRAVENOUS
  Filled 2017-06-08 (×3): qty 50
  Filled 2017-06-08: qty 0
  Filled 2017-06-08 (×3): qty 50
  Filled 2017-06-08: qty 0
  Filled 2017-06-08 (×2): qty 50
  Filled 2017-06-08: qty 0

## 2017-06-08 MED ORDER — AMLODIPINE BESYLATE 5 MG PO TABS
5.0000 mg | ORAL_TABLET | Freq: Every day | ORAL | Status: DC
Start: 2017-06-08 — End: 2017-06-23
  Administered 2017-06-08 – 2017-06-23 (×16): 5 mg via ORAL
  Filled 2017-06-08 (×16): qty 1

## 2017-06-08 MED ORDER — SODIUM CHLORIDE 0.9 % IV SOLN
INTRAVENOUS | Status: DC
  Administered 2017-06-08: 12:00:00 via INTRAVENOUS

## 2017-06-08 MED ORDER — PHENYLEPHRINE 40 MCG/ML (10ML) SYRINGE FOR IV PUSH (FOR BLOOD PRESSURE SUPPORT)
PREFILLED_SYRINGE | INTRAVENOUS | Status: DC | PRN
  Administered 2017-06-08 (×9): 80 ug via INTRAVENOUS

## 2017-06-08 MED FILL — Penicillin G Potassium Inj 60000 Unit/ML in Dextrose: INTRAVENOUS | Qty: 50 | Status: AC

## 2017-06-08 MED FILL — Penicillin G Potassium Inj 60000 Unit/ML in Dextrose: INTRAVENOUS | Qty: 50 | Status: CN

## 2017-06-08 NOTE — Anesthesia Procedure Notes (Signed)
Procedure Name: Intubation Date/Time: 06/08/2017 9:34 AM Performed by: Adria Dill, CRNA Pre-anesthesia Checklist: Patient identified, Emergency Drugs available, Suction available and Patient being monitored Patient Re-evaluated:Patient Re-evaluated prior to induction Oxygen Delivery Method: Circle system utilized Preoxygenation: Pre-oxygenation with 100% oxygen Induction Type: IV induction Ventilation: Mask ventilation without difficulty Laryngoscope Size: Miller and 3 Grade View: Grade I Tube type: Oral Tube size: 7.5 mm Number of attempts: 1 Airway Equipment and Method: Stylet Placement Confirmation: ETT inserted through vocal cords under direct vision,  positive ETCO2,  CO2 detector and breath sounds checked- equal and bilateral Secured at: 20 cm Tube secured with: Tape Dental Injury: Teeth and Oropharynx as per pre-operative assessment

## 2017-06-08 NOTE — Progress Notes (Signed)
Patient ID: Joshua Thomas, male   DOB: [birth year], 42 y.o.   MRN: [id number] INR. Procedure ,reasons risks alternatives discussed with patient and family at bed side. Continues to be  In significant pain. Most recent MRI of LS spine reveals potential  worsening at L2 L3 disc space with adjacent paraspinal inflammation . Denies autonomic dysfunction of bowel or bladder.Says difficulty with walking related to severe low back pain.  Questions answered  To their  satisfaction. Informed witnessed consent obtained. S.Phoebie Shad MD

## 2017-06-08 NOTE — Progress Notes (Signed)
PROGRESS NOTE    Patient: Joshua Thomas     PCP: Patient, No Pcp Per                    DOB: March 24, 1975            DOA: 06/04/2017 ZOX:[id number]             DOS: 06/08/2017, 11:35 AM   LOS: 4 days   Date of Service: The patient was seen and examined on 06/08/2017  Subjective:  Seen and examined this morning, status post I&D of L4-L5 disc space Procedure well. Patient was hypotensive this morning, responded well to 5 mg of IV hydralazine Started the patient on 5 mg of Norvasc daily. He is stating he does not want to be on any medication he does not needed if his blood pressure improves he would like the blood pressure medication to be DC'd. He has admitted to history of hepatitis C.  IV drug use.  Agreed and consented for HIV screenin.    ----------------------------------------------------------------------------------------------------------------------  Brief Narrative:  Joshua Thomas a 42 y.o.malewith medical history significant forbipolar disorder, chronic low back pain, tobacco abuse, hepatitis C, polysubstance abuse disorder with IV drug abuse, and GERD whowas [facility] after treatment for acute discitis/osteomyelitis in his lumbar spine along with strep viridans bacteremia with plans to continue amoxicillin for a total of 6 weeks. He was also recently started on Suboxone a few days ago due to opioid use disorder. Hepresented again with acute flare of his chronic low back pain that has been ongoing for the last week. There was no signs of cauda equina with any bowel or bladder incontinence or saddle anesthesia. He states that he has been compliant with his home antibiotics and has recently used IV cocaine. He underwent an MRI of his low back earlier todaydemonstrating severe progressive discitis/osteomyelitis at L2/3 with epidural phlegmonous changes that appeared drainable with increase in size from prior exam. ED physician at that  time consulted with neurosurgery Dr. Jordan Likes at [facility] who reviewed his MRI images with no acute surgical issue noted at thistime. Discussion was also had with ID physician Dr. Alphonsa Gin recommended withholding antibiotics at this time with transfer to [facility] for IR consult and aspiration. It appears that patient's pain was not well controlled and became upset and left AMA. He has now returned with unrelenting pain and is agreeable for transfer. He denies any fevers, chills, focal motor weakness, numbness, or tingling in his extremities.   Principal Problem:   Acute osteomyelitis of lumbar spine (HCC) Active Problems:   Chronic hepatitis C virus genotype 1a infection (HCC)   Opioid use disorder, moderate, dependence (HCC)   GERD (gastroesophageal reflux disease)   Bipolar disorder (HCC)   Discitis   Osteomyelitis (HCC)   Assessment & Plan:    Severe progressive L2/3 discitis/osteomyelitis with epidural phlegmonous changes  Recent Viridans Strep Bacteremia MRI report reviewed see  Repeat blood cx NGTD x 5 days ID c/s recommending penicillin for recent viridans strep bacteremia.  Recommending parenteral therapy rather than d/c on oral antibiotics.  Continue suboxone.   Penicillin currently being held per ID.   06/08/17- Status post IR aspiration at L2-L3  Dilaudid PO with IV for breakthrough, toradol, kpad, lidocaine patch.  He's on suboxone for opiate dependence.  Bowel regimen.     Hypertension -PRN hydralazine, added Norvasc 5 mg p.o. daily Headache:   Much improved, PRN Fioricet, monitoring closely Mild hypokalemia.  improved, follow  Mild hyponatremia. improved, follow GERD. PPI Depression/bipolar.  Stable, will continue Cymbalta and Seroquel Opiate Dependence:  Stable, continue Suboxone follow outpatient Hepatitis C: outpatient follow up, no history of treatment HIV screening  -patient has consented and agreed for screening  DVT prophylaxis: SCD Code Status:  full  Family Communication: mothre at bedside Disposition Plan: pending further eval   Consultants:  ID /  neurosurgery   Procedures:   06/08/2017 -status post aspiration of the lesion of spine at L2/3  Antimicrobials:  Anti-infectives (From admission, onward)   Start     Dose/Rate Route Frequency Ordered Stop   06/04/17 1600  penicillin G potassium 3 Million Units in dextrose 50mL IVPB  Status:  Discontinued     3 Million Units 100 mL/hr over 30 Minutes Intravenous Every 4 hours 06/04/17 1443 06/07/17 0925       Objective: Vitals:   06/08/17 1051 06/08/17 1100 06/08/17 1105 06/08/17 1116  BP: (!) 137/98 (!) 148/91 (!) 138/96 (!) 146/93  Pulse: (!) 108 96 96 94  Resp: Temp: 97.9 F (36.6 C)  97.9 F (36.6 C) 98.3 F (36.8 C)  TempSrc:    Oral  SpO2: 100% 98% 100% 100%  Weight:      Height:        Intake/Output Summary (Last 24 hours) at 06/08/2017 1135 Last data filed at 06/08/2017 1115 Gross per 24 hour  Intake 1730 ml  Output 810 ml  Net 920 ml   Filed Weights   06/04/17 0219 06/08/17 0849  Weight: 72.6 kg (160 lb) 72.6 kg (160 lb)    Examination:  General exam: Appears calm and comfortable  Psychiatry: Judgement and insight appear normal. Mood & affect appropriate. HEENT: WNLs Respiratory system: Clear to auscultation. Respiratory effort normal. Cardiovascular system: S1 & S2 heard, RRR. No JVD, murmurs, rubs, gallops or clicks. No pedal edema. Gastrointestinal system: Abd. nondistended, soft and nontender. No organomegaly or masses felt. Normal bowel sounds heard. Central nervous system: Alert and oriented. No focal neurological deficits. Musculoskeletal -tenderness at the lumbar area, negative for any drainage, range of motion spine limited secondary to pain and discomfort, strength 5 5 in upper lower extremities, sensory and motor intact Extremities: Symmetric 5 x 5 power. Skin: No rashes, lesions or ulcers  Data Reviewed: I have  personally reviewed following labs and imaging studies  CBC: Recent Labs  Lab 06/03/17 1152 06/04/17 0447 06/05/17 0615 06/06/17 0327 06/07/17 0332 06/08/17 0531  WBC 7.5 6.5 5.9 4.9 4.7 5.1  NEUTROABS 4.8  --   --   --   --   --   HGB 12.5* 12.1* 12.5* 12.2* 12.3* 12.8*  HCT 38.7* 37.9* 39.1 38.0* 38.8* 40.1  MCV 94.9 95.0 94.0 94.1 93.7 93.5  PLT 256 240 233 230 232 222   Basic Metabolic Panel: Recent Labs  Lab 06/03/17 1152 06/04/17 0447 06/05/17 0615 06/06/17 0327 06/07/17 0332 06/08/17 0531  NA 131*  --  136 137 137 137  K 3.4*  --  4.5 3.8 4.7 4.4  CL 97*  --  102 98* 100* 98*  CO2 27  --  GLUCOSE 188*  --  104* 173* 175* 113*  BUN 21*  --  CREATININE 0.97 0.90 0.93 1.03 1.12 0.92  CALCIUM 9.4  --  9.5 9.3 9.4 9.7  MG  --  1.7 1.9 1.8 1.9 1.9   GFR: Estimated Creatinine Clearance:  93.5 mL/min (by C-G formula based on SCr of 0.92 mg/dL). Liver Function Tests: Recent Labs  Lab 06/06/17 0327 06/07/17 0332  AST 25 22  ALT 37 34  ALKPHOS 57 52  BILITOT 0.5 0.5  PROT 7.6 7.2  ALBUMIN 2.9* 2.8*   No results for input(s): LIPASE, AMYLASE in the last 168 hours. No results for input(s): AMMONIA in the last 168 hours. Coagulation Profile: Recent Labs  Lab 06/03/17 1152 06/08/17 0531  INR 1.07 1.09   Cardiac Enzymes: No results for input(s): CKTOTAL, CKMB, CKMBINDEX, TROPONINI in the last 168 hours. BNP (last 3 results) No results for input(s): PROBNP in the last 8760 hours. HbA1C: No results for input(s): HGBA1C in the last 72 hours. CBG: Recent Labs  Lab 06/07/17 1127 06/07/17 1632 06/07/17 2109 06/08/17 0614 06/08/17 0900  GLUCAP 118* 194* 120* 107* 114*   Lipid Profile: No results for input(s): CHOL, HDL, LDLCALC, TRIG, CHOLHDL, LDLDIRECT in the last 72 hours. Thyroid Function Tests: No results for input(s): TSH, T4TOTAL, FREET4, T3FREE, THYROIDAB in the last 72 hours. Anemia Panel: No results for input(s):  VITAMINB12, FOLATE, FERRITIN, TIBC, IRON, RETICCTPCT in the last 72 hours. Sepsis Labs: Recent Labs  Lab 06/03/17 1152 06/03/17 1408  LATICACIDVEN 1.4 0.9    Recent Results (from the past 240 hour(s))  Culture, blood (routine x 2)     Status: None   Collection Time: 06/03/17 11:59 AM  Result Value Ref Range Status   Specimen Description BLOOD RIGHT ARM  Final   Special Requests   Final    BOTTLES DRAWN AEROBIC AND ANAEROBIC Blood Culture adequate volume   Culture   Final    NO GROWTH 5 DAYS Performed at [facility], [address], [place], [place] [address]    Report Status 06/08/2017 FINAL  Final  Culture, blood (routine x 2)     Status: None   Collection Time: 06/03/17 12:02 PM  Result Value Ref Range Status   Specimen Description BLOOD RIGHT ARM  Final   Special Requests   Final    BOTTLES DRAWN AEROBIC AND ANAEROBIC Blood Culture adequate volume   Culture   Final    NO GROWTH 5 DAYS Performed at [facility], [address], [place], [place] [address]    Report Status 06/08/2017 FINAL  Final  Urine culture     Status: None   Collection Time: 06/03/17 12:20 PM  Result Value Ref Range Status   Specimen Description   Final    URINE, CLEAN CATCH Performed at [facility], [address] [place], [place] [address]    Special Requests   Final    NONE Performed at [facility], [address] [place], [place] [address]    Culture   Final    NO GROWTH Performed at [facility], [address], [place], [place] [address]    Report Status 06/04/2017 FINAL  Final      Radiology Studies: No results found.  Scheduled Meds: . amLODipine  5 mg Oral Daily  . buprenorphine-naloxone  1 tablet Sublingual BID  . gabapentin  300 mg Oral TID  . insulin aspart  0-15 Units Subcutaneous TID WC  . insulin aspart  0-5 Units Subcutaneous QHS  . lidocaine  1 patch Transdermal Q24H  . methocarbamol  500 mg Oral TID  . nicotine  21 mg Transdermal Daily  . pantoprazole  40 mg  Oral Daily  . polyethylene glycol  17 g Oral BID   Continuous  Infusions: . sodium chloride      Time spent: >25 minutes  Kendell Bane, MD [facility],  Pager [phone]  If 7PM-7AM, please contact night-coverage [email or web address]   Password [facility]  06/08/2017, 11:35 AM

## 2017-06-08 NOTE — Progress Notes (Signed)
ANTIBIOTIC CONSULT NOTE   Pharmacy Consult for PCN Indication: S. Viridans bacteremia  Allergies  Allergen Reactions  . Acetaminophen Other (See Comments)    Due to Ulcers/ Liver Damage  . Buprenorphine Hcl Itching  . Meperidine Rash  . Morphine And Related Itching    Patient Measurements: Height:  (160 cm) Weight: 160 lb (72.6 kg) IBW/kg (Calculated) : 56.9 Adjusted Body Weight:    Vital Signs: Temp: 98.3 F (36.8 C) (05/29 1116) Temp Source: Oral (05/29 1116) BP: 146/93 (05/29 1116) Pulse Rate: 94 (05/29 1116) Intake/Output from previous day: 05/28 0701 - 05/29 0700 In: 480 [P.O.:480] Out: 1200 [Urine:1200] Intake/Output from this shift: Total I/O In: 1730 [P.O.:480; I.V.:1200; Other:50] Out: 410 [Urine:400; Blood:10]  Labs: Recent Labs    06/06/17 0327 06/07/17 0332 06/08/17 0531  WBC 4.9 4.7 5.1  HGB 12.2* 12.3* 12.8*  PLT 230 232 222  CREATININE 1.03 1.12 0.92   Estimated Creatinine Clearance: 93.5 mL/min (by C-G formula based on SCr of 0.92 mg/dL). No results for input(s): VANCOTROUGH, VANCOPEAK, VANCORANDOM, GENTTROUGH, GENTPEAK, GENTRANDOM, TOBRATROUGH, TOBRAPEAK, TOBRARND, AMIKACINPEAK, AMIKACINTROU, AMIKACIN in the last 72 hours.   Microbiology:   Medical History: Past Medical History:  Diagnosis Date  . ADHD (attention deficit hyperactivity disorder)   . Bipolar 1 disorder (HCC)   . Chronic back pain   . Chronic pain    chronic l leg pain  . DDD (degenerative disc disease), lumbar   . Degenerative disc disease, lumbar   . Depression   . GERD (gastroesophageal reflux disease)   . Hepatitis C   . Hepatitis C   . History of stomach ulcers   . Sciatica   . Substance abuse (HCC)     Assessment:  ID: PCN G IV for S. Viridans bacteremia since 06/04/17.  Afebrile. WBC WNL - Severe progressive discitis at lumbar 2 and 3 spine with an epidural phlegmonous changes  5/29: I&D lumbar spine, disc aspiration L2-L3  Goal of Therapy:   Eradication of infection  Plan:  PenG 3 million units (24 mil units daily) q 4 hrs  Romelo Sciandra S. Merilynn Finland, PharmD, BCPS Clinical Staff Pharmacist Pager [phone]  Misty Stanley Stillinger 06/08/2017,1:05 PM

## 2017-06-08 NOTE — Progress Notes (Signed)
Patient demanding to go back to room. States he wants to take his home meds. Patient alert and oriented, breathing comfortably on room air, and good motor/sensation to all 4 extremities.

## 2017-06-08 NOTE — Procedures (Signed)
S/P L2- L3  fluoro  guided disc aspiration x 2  Under GA . Approx 1.56ml of thick bloody aspirate obtained and sent for microbiological analysis.

## 2017-06-08 NOTE — Progress Notes (Signed)
[facility]  Date of Admission:  06/04/2017            ASSESSMENT/PLAN  Mr. Alberico is a 42 y/o male with history of substance abuse, bipolar, degenerative disc disease, and Hepatitis C found to have osteomyelitis/discitis with Viridans Strep. Underwent successful disc aspiration today with surgical cultures pending. Afebrile with no leukocytosis. Back pain improved since admission and able to ambulate with the assistance of the walker. Disposition remains pending following procedure and awaiting culture results.   1. Restart penicillin for Viridans Strep osteomyelitis with changes to regimen pending culture results.   Principal Problem:   Acute osteomyelitis of lumbar spine (HCC) Active Problems:   Chronic hepatitis C virus genotype 1a infection (HCC)   Opioid use disorder, moderate, dependence (HCC)   GERD (gastroesophageal reflux disease)   Bipolar disorder (HCC)   Discitis   Osteomyelitis (HCC)   . amLODipine  5 mg Oral Daily  . buprenorphine-naloxone  1 tablet Sublingual BID  . gabapentin  300 mg Oral TID  . insulin aspart  0-15 Units Subcutaneous TID WC  . insulin aspart  0-5 Units Subcutaneous QHS  . lidocaine  1 patch Transdermal Q24H  . methocarbamol  500 mg Oral TID  . nicotine  21 mg Transdermal Daily  . pantoprazole  40 mg Oral Daily  . polyethylene glycol  17 g Oral BID    SUBJECTIVE:  Afebrile overnight with no leukocytosis. Underwent successful IR aspiration today. Had worsening back pain last night off antibiotics.  Allergies  Allergen Reactions  . Acetaminophen Other (See Comments)    Due to Ulcers/ Liver Damage  . Buprenorphine Hcl Itching  . Meperidine Rash  . Morphine And Related Itching     Review of Systems: Review of Systems  Constitutional: Negative for chills, diaphoresis and fever.  Respiratory: Negative for cough, shortness of breath and wheezing.   Cardiovascular: Negative for chest pain.    Gastrointestinal: Negative for abdominal pain, diarrhea, nausea and vomiting.  Musculoskeletal: Positive for back pain.  Skin: Negative for rash.  Neurological: Negative for weakness and headaches.      OBJECTIVE: Vitals:   06/08/17 1051 06/08/17 1100 06/08/17 1105 06/08/17 1116  BP: (!) 137/98 (!) 148/91 (!) 138/96 (!) 146/93  Pulse: (!) 108 96 96 94  Resp: Temp: 97.9 F (36.6 C)  97.9 F (36.6 C) 98.3 F (36.8 C)  TempSrc:    Oral  SpO2: 100% 98% 100% 100%  Weight:      Height:       Body mass index is 28.34 kg/m.  Physical Exam  Constitutional: He is oriented to person, place, and time. He appears well-developed and well-nourished. No distress.  Lying in bed with head of bed slightly elevated. Pleasant.   Cardiovascular: Normal rate, regular rhythm, normal heart sounds and intact distal pulses. Exam reveals no gallop and no friction rub.  No murmur heard. Pulmonary/Chest: Effort normal and breath sounds normal. No stridor. No respiratory distress. He has no wheezes. He has no rales. He exhibits no tenderness.  Neurological: He is alert and oriented to person, place, and time.  Skin: Skin is warm and dry.  Psychiatric: He has a normal mood and affect. His behavior is normal.    Lab Results Lab Results  Component Value Date   WBC 5.1 06/08/2017   HGB 12.8 (L) 06/08/2017   HCT 40.1 06/08/2017   MCV 93.5 06/08/2017   PLT 222 06/08/2017  Lab Results  Component Value Date   CREATININE 0.92 06/08/2017   BUN 13 06/08/2017   NA 137 06/08/2017   K 4.4 06/08/2017   CL 98 (L) 06/08/2017   CO2 31 06/08/2017    Lab Results  Component Value Date   ALT 34 06/07/2017   AST 22 06/07/2017   ALKPHOS 52 06/07/2017   BILITOT 0.5 06/07/2017     Microbiology: Recent Results (from the past 240 hour(s))  Culture, blood (routine x 2)     Status: None   Collection Time: 06/03/17 11:59 AM  Result Value Ref Range Status   Specimen Description BLOOD RIGHT ARM   Final   Special Requests   Final    BOTTLES DRAWN AEROBIC AND ANAEROBIC Blood Culture adequate volume   Culture   Final    NO GROWTH 5 DAYS Performed at [facility], [address], [place], [place] [address]    Report Status 06/08/2017 FINAL  Final  Culture, blood (routine x 2)     Status: None   Collection Time: 06/03/17 12:02 PM  Result Value Ref Range Status   Specimen Description BLOOD RIGHT ARM  Final   Special Requests   Final    BOTTLES DRAWN AEROBIC AND ANAEROBIC Blood Culture adequate volume   Culture   Final    NO GROWTH 5 DAYS Performed at [facility], [address], [place], [place] [address]    Report Status 06/08/2017 FINAL  Final  Urine culture     Status: None   Collection Time: 06/03/17 12:20 PM  Result Value Ref Range Status   Specimen Description   Final    URINE, CLEAN CATCH Performed at [facility], [address], [place], [place] [address]    Special Requests   Final    NONE Performed at [facility], [address] [place], [place] [address]    Culture   Final    NO GROWTH Performed at [facility], [address], [place], [place] [address]    Report Status 06/04/2017 FINAL  Final     Marcos Eke, NP [facility] [phone] Pager  06/08/2017  12:43 PM

## 2017-06-08 NOTE — Anesthesia Postprocedure Evaluation (Signed)
Anesthesia Post Note  Patient: Joshua Thomas  Procedure(s) Performed: DISC ASPIRATION (N/A )     Patient location during evaluation: PACU Anesthesia Type: General Level of consciousness: awake and alert and oriented Pain management: pain level controlled Vital Signs Assessment: post-procedure vital signs reviewed and stable Respiratory status: spontaneous breathing, nonlabored ventilation and respiratory function stable Cardiovascular status: blood pressure returned to baseline and stable Postop Assessment: no apparent nausea or vomiting Anesthetic complications: no    Last Vitals:  Vitals:   06/08/17 1105 06/08/17 1116  BP: (!) 138/96 (!) 146/93  Pulse: 96 94  Resp: 14 20  Temp: 36.6 C 36.8 C  SpO2: 100% 100%    Last Pain:  Vitals:   06/08/17 1116  TempSrc: Oral  PainSc:                  Simrat Kendrick A.

## 2017-06-08 NOTE — Plan of Care (Signed)
  Problem: Education: Goal: Knowledge of General Education information will improve Outcome: Progressing   Problem: Clinical Measurements: Goal: Ability to maintain clinical measurements within normal limits will improve Outcome: Progressing   Problem: Clinical Measurements: Goal: Will remain free from infection Outcome: Progressing   Problem: Pain Managment: Goal: General experience of comfort will improve Outcome: Progressing   

## 2017-06-08 NOTE — Transfer of Care (Signed)
Immediate Anesthesia Transfer of Care Note  Patient: Joshua Thomas  Procedure(s) Performed: DISC ASPIRATION (N/A )  Patient Location: PACU  Anesthesia Type:General  Level of Consciousness: awake, alert , oriented and patient cooperative  Airway & Oxygen Therapy: Patient Spontanous Breathing and Patient connected to nasal cannula oxygen  Post-op Assessment: Report given to RN, Post -op Vital signs reviewed and stable and Patient moving all extremities X 4  Post vital signs: Reviewed and stable  Last Vitals:  Vitals Value Taken Time  BP 137/98 06/08/2017 10:51 AM  Temp 36.6 C 06/08/2017 10:51 AM  Pulse 95 06/08/2017 10:55 AM  Resp 17 06/08/2017 10:55 AM  SpO2 100 % 06/08/2017 10:55 AM  Vitals shown include unvalidated device data.  Last Pain:  Vitals:   06/08/17 0522  TempSrc:   PainSc: Asleep         Complications: No apparent anesthesia complications

## 2017-06-09 ENCOUNTER — Inpatient Hospital Stay: Payer: Self-pay | Admitting: Family Medicine

## 2017-06-09 ENCOUNTER — Encounter (HOSPITAL_COMMUNITY): Payer: Self-pay | Admitting: Interventional Radiology

## 2017-06-09 DIAGNOSIS — B954 Other streptococcus as the cause of diseases classified elsewhere: Secondary | ICD-10-CM

## 2017-06-09 DIAGNOSIS — F319 Bipolar disorder, unspecified: Secondary | ICD-10-CM

## 2017-06-09 DIAGNOSIS — B1920 Unspecified viral hepatitis C without hepatic coma: Secondary | ICD-10-CM

## 2017-06-09 DIAGNOSIS — F1120 Opioid dependence, uncomplicated: Secondary | ICD-10-CM

## 2017-06-09 DIAGNOSIS — M4620 Osteomyelitis of vertebra, site unspecified: Secondary | ICD-10-CM

## 2017-06-09 DIAGNOSIS — Z79899 Other long term (current) drug therapy: Secondary | ICD-10-CM

## 2017-06-09 DIAGNOSIS — M4640 Discitis, unspecified, site unspecified: Secondary | ICD-10-CM

## 2017-06-09 LAB — GLUCOSE, CAPILLARY
GLUCOSE-CAPILLARY: 103 mg/dL — AB (ref 65–99)
Glucose-Capillary: 180 mg/dL — ABNORMAL HIGH (ref 65–99)
Glucose-Capillary: 249 mg/dL — ABNORMAL HIGH (ref 65–99)
Glucose-Capillary: 158 mg/dL — ABNORMAL HIGH (ref 65–99)
Glucose-Capillary: 122 mg/dL — ABNORMAL HIGH (ref 65–99)
Glucose-Capillary: 171 mg/dL — ABNORMAL HIGH (ref 65–99)

## 2017-06-09 LAB — CBC
HCT: 39.5 % (ref 39.0–52.0)
HEMOGLOBIN: 12.8 g/dL — AB (ref 13.0–17.0)
MCH: 30.3 pg (ref 26.0–34.0)
MCHC: 32.4 g/dL (ref 30.0–36.0)
MCV: 93.4 fL (ref 78.0–100.0)
Platelets: 245 10*3/uL (ref 150–400)
RBC: 4.23 MIL/uL (ref 4.22–5.81)
RDW: 12.5 % (ref 11.5–15.5)
WBC: 7.7 10*3/uL (ref 4.0–10.5)

## 2017-06-09 LAB — BASIC METABOLIC PANEL
Anion gap: 8 (ref 5–15)
BUN: 15 mg/dL (ref 6–20)
CALCIUM: 9.7 mg/dL (ref 8.9–10.3)
CHLORIDE: 102 mmol/L (ref 101–111)
CO2: 25 mmol/L (ref 22–32)
CREATININE: 0.93 mg/dL (ref 0.61–1.24)
GFR calc Af Amer: >60 mL/min (ref >60)
GFR calc non Af Amer: >60 mL/min (ref >60)
Glucose, Bld: 122 mg/dL — ABNORMAL HIGH (ref 65–99)
Potassium: 4.3 mmol/L (ref 3.5–5.1)
SODIUM: 135 mmol/L (ref 135–145)

## 2017-06-09 LAB — HIV ANTIBODY (ROUTINE TESTING W REFLEX): HIV SCREEN 4TH GENERATION: NONREACTIVE

## 2017-06-09 MED ORDER — PENICILLIN G POTASSIUM 5000000 UNITS IJ SOLR
3.0000 10*6.[IU] | INTRAVENOUS | Status: DC
  Administered 2017-06-09 – 2017-06-11 (×12): 3 10*6.[IU] via INTRAVENOUS
  Filled 2017-06-09 (×15): qty 3

## 2017-06-09 MED FILL — Penicillin G Potassium Inj 60000 Unit/ML in Dextrose: INTRAVENOUS | Qty: 50 | Status: AC

## 2017-06-09 MED FILL — Penicillin G Potassium For Inj 5000000 Unit: INTRAMUSCULAR | Qty: 1 | Status: AC

## 2017-06-09 NOTE — Progress Notes (Signed)
PROGRESS NOTE    Patient: Joshua Thomas     PCP: Patient, No Pcp Per                    DOB: Feb 16, 1975            DOA: 06/04/2017 OZH:[id number]             DOS: 06/09/2017, 3:29 PM   LOS: 5 days   Date of Service: The patient was seen and examined on 06/09/2017  Subjective:  Status post I&D of L2/3  disc space, day #2 Was seen and examined this morning, stable in good spirits, no issues overnight.  He was happy to hear that his HIV screen was negative. Stating he is morning will need to be compliant with his current antibiotic regimen Pressing concern about lung nodules.   Otherwise stable, on IV antibiotics of penicillin G      ----------------------------------------------------------------------------------------------------------------------  Brief Narrative:  Shonna Chock Petermanis a 42 y.o.malewith medical history significant forbipolar disorder, chronic low back pain, tobacco abuse, hepatitis C, polysubstance abuse disorder with IV drug abuse, and GERD whowas [facility] after treatment for acute discitis/osteomyelitis in his lumbar spine along with strep viridans bacteremia with plans to continue amoxicillin for a total of 6 weeks. He was also recently started on Suboxone a few days ago due to opioid use disorder. Hepresented again with acute flare of his chronic low back pain that has been ongoing for the last week. There was no signs of cauda equina with any bowel or bladder incontinence or saddle anesthesia. He states that he has been compliant with his home antibiotics and has recently used IV cocaine. He underwent an MRI of his low back earlier todaydemonstrating severe progressive discitis/osteomyelitis at L2/3 with epidural phlegmonous changes that appeared drainable with increase in size from prior exam. ED physician at that time consulted with neurosurgery Dr. Jordan Likes at [facility] who reviewed his MRI images with no acute  surgical issue noted at thistime. Discussion was also had with ID physician Dr. Alphonsa Gin recommended withholding antibiotics at this time with transfer to [facility] for IR consult and aspiration. It appears that patient's pain was not well controlled and became upset and left AMA. He has now returned with unrelenting pain and is agreeable for transfer. He denies any fevers, chills, focal motor weakness, numbness, or tingling in his extremities. -----------------------------------------------------------------------------------------------------------------------------------------  Principal Problem:   Acute osteomyelitis of lumbar spine (HCC) Active Problems:   Chronic hepatitis C virus genotype 1a infection (HCC)   Opioid use disorder, moderate, dependence (HCC)   GERD (gastroesophageal reflux disease)   Bipolar disorder (HCC)   Discitis   Osteomyelitis (HCC)   Assessment & Plan:    Severe progressive L2/3 discitis/osteomyelitis with epidural phlegmonous changes  Recent Viridans Strep Bacteremia MRI report reviewed see  Fluid culture negative to date, day 1 ID c/s recommending penicillin for recent viridans strep bacteremia.  Recommending parenteral therapy rather than d/c on oral antibiotics.  Continue suboxone.   Restarted on IV antibiotics of penicillin recommending 6 weeks of IV antibiotic therapy since he failed oral therapy.   06/08/17- Status post IR aspiration at L2-L3  Dilaudid PO with IV for breakthrough, toradol, kpad, lidocaine patch.  He's on suboxone for opiate dependence.  Bowel regimen.     Hypertension -PRN hydralazine, added Norvasc 5 mg p.o. daily Headache:   Much improved, PRN Fioricet, monitoring closely With hypokalemia/hyponatremia - improved, monitoring and repeating accordingly GERD. PPI  Depression/bipolar.  Stable, will continue Cymbalta and Seroquel Opiate Dependence:  Stable, continue Suboxone follow outpatient Hepatitis C: outpatient follow  up, no history of treatment HIV screening  -screening was negative  DVT prophylaxis: SCD Code Status: full  Family Communication: mothre at bedside Disposition Plan: pending further eval   Consultants:  ID /  neurosurgery   Procedures:   06/08/2017 -status post aspiration of the lesion of spine at L2/3  Antimicrobials:  Anti-infectives (From admission, onward)   Start     Dose/Rate Route Frequency Ordered Stop   06/09/17 1600  penicillin G potassium 3 Million Units in dextrose 5 % 100 mL IVPB     3 Million Units 200 mL/hr over 30 Minutes Intravenous Every 4 hours 06/09/17 1504     06/08/17 1330  penicillin G potassium 3 Million Units in dextrose 50mL IVPB  Status:  Discontinued     3 Million Units 100 mL/hr over 30 Minutes Intravenous Every 4 hours 06/08/17 1253 06/09/17 1504   06/04/17 1600  penicillin G potassium 3 Million Units in dextrose 50mL IVPB  Status:  Discontinued     3 Million Units 100 mL/hr over 30 Minutes Intravenous Every 4 hours 06/04/17 1443 06/07/17 0925       Objective: Vitals:   06/08/17 1105 06/08/17 1116 06/08/17 2033 06/09/17 0403  BP: (!) 138/96 (!) 146/93 (!) 144/92 (!) 140/100  Pulse: 96 94 (!) 121 95  Resp: Temp: 97.9 F (36.6 C) 98.3 F (36.8 C) 98.2 F (36.8 C) 98.3 F (36.8 C)  TempSrc:  Oral Oral Oral  SpO2: 100% 100% 100% 100%  Weight:      Height:        Intake/Output Summary (Last 24 hours) at 06/09/2017 1529 Last data filed at 06/09/2017 0404 Gross per 24 hour  Intake 290 ml  Output 750 ml  Net -460 ml   Filed Weights   06/04/17 0219 06/08/17 0849  Weight: 72.6 kg (160 lb) 72.6 kg (160 lb)   BP (!) 140/100 (BP Location: Right Arm)   Pulse 95   Temp 98.3 F (36.8 C) (Oral)   Resp 20   Ht  (1.6 m)   Wt 72.6 kg (160 lb)   SpO2 100%   BMI 28.34 kg/m    Physical Exam  Constitution:  Alert, cooperative, no distress,  Psychiatric: Normal and stable mood and affect, cognition intact,   HEENT:  Normocephalic, PERRL, otherwise with in Normal limits  Cardio vascular:  S1/S2, RRR, No murmure, No Rubs or Gallops  Chest/pulmonary: Clear to auscultation bilaterally, respirations unlabored  Chest symmetric Abdomen: Soft, non-tender, non-distended, bowel sounds,no masses, no organomegaly Muscular skeletal: Limited exam - in bed, able to move all 4 extremities, Normal strength,  Neuro: CNII-XII intact. , normal motor and sensation, reflexes intact  Extremities: No pitting edema lower extremities, +2 pulses  Skin: Dry, warm to touch, negative for any Rashes, No open wounds      Data Reviewed: I have personally reviewed following labs and imaging studies  CBC: Recent Labs  Lab 06/03/17 1152  06/05/17 0615 06/06/17 0327 06/07/17 0332 06/08/17 0531 06/09/17 0341  WBC 7.5   < > 5.9 4.9 4.7 5.1 7.7  NEUTROABS 4.8  --   --   --   --   --   --   HGB 12.5*   < > 12.5* 12.2* 12.3* 12.8* 12.8*  HCT 38.7*   < > 39.1 38.0* 38.8* 40.1 39.5  MCV  94.9   < > 94.0 94.1 93.7 93.5 93.4  PLT 256   < > 233 230 232 222 245   < > = values in this interval not displayed.   Basic Metabolic Panel: Recent Labs  Lab 06/04/17 0447 06/05/17 0615 06/06/17 0327 06/07/17 0332 06/08/17 0531 06/09/17 1112  NA  --  136 137 137 137 135  K  --  4.5 3.8 4.7 4.4 4.3  CL  --  102 98* 100* 98* 102  CO2  --  GLUCOSE  --  104* 173* 175* 113* 122*  BUN  --  CREATININE 0.90 0.93 1.03 1.12 0.92 0.93  CALCIUM  --  9.5 9.3 9.4 9.7 9.7  MG 1.7 1.9 1.8 1.9 1.9  --    GFR: Estimated Creatinine Clearance: 92.5 mL/min (by C-G formula based on SCr of 0.93 mg/dL). Liver Function Tests: Recent Labs  Lab 06/06/17 0327 06/07/17 0332  AST 25 22  ALT 37 34  ALKPHOS 57 52  BILITOT 0.5 0.5  PROT 7.6 7.2  ALBUMIN 2.9* 2.8*   No results for input(s): LIPASE, AMYLASE in the last 168 hours. No results for input(s): AMMONIA in the last 168 hours. Coagulation Profile: Recent Labs    Lab 06/03/17 1152 06/08/17 0531  INR 1.07 1.09  CBG: Recent Labs  Lab 06/08/17 1221 06/08/17 1644 06/08/17 2215 06/09/17 0641 06/09/17 1135  GLUCAP 180* 249* 158* 122* 103*    Recent Labs  Lab 06/03/17 1152 06/03/17 1408  LATICACIDVEN 1.4 0.9    Recent Results (from the past 240 hour(s))  Culture, blood (routine x 2)     Status: None   Collection Time: 06/03/17 11:59 AM  Result Value Ref Range Status   Specimen Description BLOOD RIGHT ARM  Final   Special Requests   Final    BOTTLES DRAWN AEROBIC AND ANAEROBIC Blood Culture adequate volume   Culture   Final    NO GROWTH 5 DAYS Performed at [facility], [address] [place], [place] [address]    Report Status 06/08/2017 FINAL  Final  Culture, blood (routine x 2)     Status: None   Collection Time: 06/03/17 12:02 PM  Result Value Ref Range Status   Specimen Description BLOOD RIGHT ARM  Final   Special Requests   Final    BOTTLES DRAWN AEROBIC AND ANAEROBIC Blood Culture adequate volume   Culture   Final    NO GROWTH 5 DAYS Performed at [facility], [address], [place], [place] [address]    Report Status 06/08/2017 FINAL  Final  Urine culture     Status: None   Collection Time: 06/03/17 12:20 PM  Result Value Ref Range Status   Specimen Description   Final    URINE, CLEAN CATCH Performed at [facility], [address], [place], [place] [address]    Special Requests   Final    NONE Performed at [facility], [address] [place], [place] [address]    Culture   Final    NO GROWTH Performed at [facility], [address] [place], [place] [address]    Report Status 06/04/2017 FINAL  Final  Aerobic/Anaerobic Culture (surgical/deep wound)     Status: None (Preliminary result)   Collection Time: 06/08/17 10:30 AM  Result Value Ref Range Status   Specimen Description ABSCESS VERTEBRA  Final   Special Requests NONE  Final   Gram  Stain   Final    FEW WBC PRESENT, PREDOMINANTLY PMN NO ORGANISMS  SEEN    Culture   Final    NO GROWTH 1 DAY Performed at [facility], [address], [place], [place] [address]    Report Status PENDING  Incomplete      Radiology Studies: Ir Lumbar Disc Aspiration W/img Guide  Result Date: 06/09/2017 INDICATION: L2-L3 discitis and osteomyelitis. EXAM: FLUOROSCOPIC GUIDED DISC ASPIRATION AT L2-L3 MEDICATIONS: The patient is currently admitted to the hospital and receiving intravenous antibiotics. The antibiotics were administered within an appropriate time frame prior to the initiation of the procedure. ANESTHESIA/SEDATION: General anesthesia as per the Department of Anesthesiology at [facility]. COMPLICATIONS: None immediate. PROCEDURE: Informed written consent was obtained from the patient after a thorough discussion of the procedural risks, benefits and alternatives. All questions were addressed. The patient was then placed under general anesthesia by the Department of Anesthesiology and placed in a prone position. Maximal Sterile Barrier Technique was utilized including caps, mask, sterile gowns, sterile gloves, sterile drape, hand hygiene and skin antiseptic. A timeout was performed prior to the initiation of the procedure. The skin overlying the L2-L3 region was then prepped and draped in the usual sterile fashion. Using biplane intermittent fluoroscopy, after having identified the L2-L3 disc space, using a right posterolateral approach, a 21 gauge Franseen needle was then advanced into the L2-L3 disc space on crossing the midline. Using a tight 20 mL syringe, thick bloody aspirate was obtained. Similarly, a left posterolateral approach was then utilized at the L2-L3 disc space again with a 21 gauge Franseen needle which was advanced passed midline. Using a 20 mL syringe, thick blood-tinged aspirate was obtained. In both passes, a total of approximately 1.5 mL of thick bloody aspirate was obtained and sent for microbiologic analysis. The needles  were then removed. Hemostasis was then achieved at the skin entry points. The patient's general anesthesia was then reversed. The patient was extubated without difficulty. Upon recovery, the patient was able to move both his legs equally, and was alert, awake and oriented. He was then sent to recovery and thereafter to his floor in a stable condition. IMPRESSION: Status post fluoroscopic guided disc aspiration at L2-L3 for discitis/osteomyelitis as described above. Electronically Signed   By: Julieanne Cotton M.D.   On: 06/08/2017 11:38    Scheduled Meds: . amLODipine  5 mg Oral Daily  . buprenorphine-naloxone  1 tablet Sublingual BID  . gabapentin  300 mg Oral TID  . insulin aspart  0-15 Units Subcutaneous TID WC  . insulin aspart  0-5 Units Subcutaneous QHS  . lidocaine  1 patch Transdermal Q24H  . methocarbamol  500 mg Oral TID  . nicotine  21 mg Transdermal Daily  . pantoprazole  40 mg Oral Daily  . polyethylene glycol  17 g Oral BID   Continuous Infusions: . penicillin G 2.5 - 3.5 MILLION UNITS IVPB      Time spent: >25 minutes  Kendell Bane, MD [facility],  Pager [phone]  If 7PM-7AM, please contact night-coverage [email or web address]   Password [email or web address]  06/09/2017, 3:29 PM

## 2017-06-09 NOTE — Progress Notes (Signed)
[facility]  Date of Admission:  06/04/2017             ASSESSMENT/PLAN  [name] is a 42 y/o male with history of substance abuse, bipolar, degenerative disc disease, and Hepatitis C found to have osteomyelitis/discitis with Viridans Strep. Cultures from aspiration with no organisms seen on gram stain with cultures remaining pending. He has remained afebrile with no leukocytosis. He was restarted on penicillin yesterday with no adverse side effects. Pain remains adequately controlled. Disposition remains in question.  1. Continue current dosage of penicillin for strep viridans osteomyelitis/discitis. Will require at least 6 weeks of IV therapy given failure of oral therapy. 2. Continue pain management per primary team. 3. Continue Suboxone for OUD.  4. Monitor cultures.    Principal Problem:   Acute osteomyelitis of lumbar spine (HCC) Active Problems:   Chronic hepatitis C virus genotype 1a infection (HCC)   Opioid use disorder, moderate, dependence (HCC)   GERD (gastroesophageal reflux disease)   Bipolar disorder (HCC)   Discitis   Osteomyelitis (HCC)   . amLODipine  5 mg Oral Daily  . buprenorphine-naloxone  1 tablet Sublingual BID  . gabapentin  300 mg Oral TID  . insulin aspart  0-15 Units Subcutaneous TID WC  . insulin aspart  0-5 Units Subcutaneous QHS  . lidocaine  1 patch Transdermal Q24H  . methocarbamol  500 mg Oral TID  . nicotine  21 mg Transdermal Daily  . pantoprazole  40 mg Oral Daily  . polyethylene glycol  17 g Oral BID    SUBJECTIVE:  Afebrile overnight with no leukocytosis. Cultures remain pending with no growth in one day. Continues to have pain located in his back with a severity of moderate/severe.   Allergies  Allergen Reactions  . Acetaminophen Other (See Comments)    Due to Ulcers/ Liver Damage  . Buprenorphine Hcl Itching  . Meperidine Rash  . Morphine And Related Itching     Review of Systems: Review  of Systems  Constitutional: Negative for chills, diaphoresis and fever.  Respiratory: Negative for cough, sputum production, shortness of breath and wheezing.   Cardiovascular: Negative for chest pain, palpitations and leg swelling.  Gastrointestinal: Negative for abdominal pain, constipation, diarrhea, nausea and vomiting.  Genitourinary: Negative for frequency, hematuria and urgency.  Musculoskeletal: Positive for back pain.  Skin: Negative for rash.      OBJECTIVE: Vitals:   06/08/17 1105 06/08/17 1116 06/08/17 2033 06/09/17 0403  BP: (!) 138/96 (!) 146/93 (!) 144/92 (!) 140/100  Pulse: 96 94 (!) 121 95  Resp: Temp: 97.9 F (36.6 C) 98.3 F (36.8 C) 98.2 F (36.8 C) 98.3 F (36.8 C)  TempSrc:  Oral Oral Oral  SpO2: 100% 100% 100% 100%  Weight:      Height:       Body mass index is 28.34 kg/m.  Physical Exam  Constitutional: He is oriented to person, place, and time. He appears well-developed and well-nourished. No distress.  Lying in bed with head of bed elevated. Plastic wrap around arm in preparation for showering.   Cardiovascular: Normal rate, regular rhythm, normal heart sounds and intact distal pulses. Exam reveals no gallop and no friction rub.  No murmur heard. Pulmonary/Chest: Effort normal and breath sounds normal. No stridor. No respiratory distress. He has no wheezes. He has no rales. He exhibits no tenderness.  Abdominal: Soft. Bowel sounds are normal.  Neurological: He is alert and oriented  to person, place, and time.  Skin: Skin is warm and dry.  Psychiatric: He has a normal mood and affect. His behavior is normal. Judgment and thought content normal.    Lab Results Lab Results  Component Value Date   WBC 7.7 06/09/2017   HGB 12.8 (L) 06/09/2017   HCT 39.5 06/09/2017   MCV 93.4 06/09/2017   PLT 245 06/09/2017    Lab Results  Component Value Date   CREATININE 0.92 06/08/2017   BUN 13 06/08/2017   NA 137 06/08/2017   K 4.4 06/08/2017    CL 98 (L) 06/08/2017   CO2 31 06/08/2017    Lab Results  Component Value Date   ALT 34 06/07/2017   AST 22 06/07/2017   ALKPHOS 52 06/07/2017   BILITOT 0.5 06/07/2017     Microbiology: Recent Results (from the past 240 hour(s))  Culture, blood (routine x 2)     Status: None   Collection Time: 06/03/17 11:59 AM  Result Value Ref Range Status   Specimen Description BLOOD RIGHT ARM  Final   Special Requests   Final    BOTTLES DRAWN AEROBIC AND ANAEROBIC Blood Culture adequate volume   Culture   Final    NO GROWTH 5 DAYS Performed at [facility], [address], [place], [place] [address]    Report Status 06/08/2017 FINAL  Final  Culture, blood (routine x 2)     Status: None   Collection Time: 06/03/17 12:02 PM  Result Value Ref Range Status   Specimen Description BLOOD RIGHT ARM  Final   Special Requests   Final    BOTTLES DRAWN AEROBIC AND ANAEROBIC Blood Culture adequate volume   Culture   Final    NO GROWTH 5 DAYS Performed at [facility], [address], [place], [place] [address]    Report Status 06/08/2017 FINAL  Final  Urine culture     Status: None   Collection Time: 06/03/17 12:20 PM  Result Value Ref Range Status   Specimen Description   Final    URINE, CLEAN CATCH Performed at [facility], [address] [place], [place] [address]    Special Requests   Final    NONE Performed at [facility], [address], [place], [place] [address]    Culture   Final    NO GROWTH Performed at [facility], [address], [place], [place] [address]    Report Status 06/04/2017 FINAL  Final  Aerobic/Anaerobic Culture (surgical/deep wound)     Status: None (Preliminary result)   Collection Time: 06/08/17 10:30 AM  Result Value Ref Range Status   Specimen Description ABSCESS VERTEBRA  Final   Special Requests NONE  Final   Gram Stain   Final    FEW WBC PRESENT, PREDOMINANTLY PMN NO ORGANISMS SEEN Performed at [facility], [address]  [place], [place] [address]    Culture PENDING  Incomplete   Report Status PENDING  Incomplete     Marcos Eke, NP [facility] [phone] Pager  06/09/2017  11:20 AM

## 2017-06-10 LAB — GLUCOSE, CAPILLARY
GLUCOSE-CAPILLARY: 181 mg/dL — AB (ref 65–99)
GLUCOSE-CAPILLARY: 193 mg/dL — AB (ref 65–99)
Glucose-Capillary: 367 mg/dL — ABNORMAL HIGH (ref 65–99)
Glucose-Capillary: 114 mg/dL — ABNORMAL HIGH (ref 65–99)

## 2017-06-10 LAB — CBC
HCT: 45.6 % (ref 39.0–52.0)
HEMOGLOBIN: 14.5 g/dL (ref 13.0–17.0)
MCH: 30 pg (ref 26.0–34.0)
MCHC: 31.8 g/dL (ref 30.0–36.0)
MCV: 94.2 fL (ref 78.0–100.0)
Platelets: 267 10*3/uL (ref 150–400)
RBC: 4.84 MIL/uL (ref 4.22–5.81)
RDW: 12.7 % (ref 11.5–15.5)
WBC: 6.9 10*3/uL (ref 4.0–10.5)

## 2017-06-10 NOTE — Evaluation (Signed)
Physical Therapy Evaluation Patient Details Name: Joshua Thomas MRN: [id number] DOB: 08-Sep-1975 Today's Date: 06/10/2017   History of Present Illness  Pt 42 y.o. male with medical history significant for bipolar disorder, chronic low back pain, tobacco abuse, hepatitis C, polysubstance abuse disorder with IV drug abuse, and GERD who was recently discharged from [facility] on 05/26/2017 after treatment for acute discitis/osteomyelitis in his lumbar spine. Pt now s/p I&D of L2/3 disc space on 5/30.    Clinical Impression  Pt presented supine in bed with HOB elevated, awake and willing to participate in therapy session. Pt providing vague responses to history questions, but seems like he was independent with functional mobility PTA. Pt currently very limited secondary to pain. Very impulsive, with poor safety awareness and completely not receptive to therapists' cueing for technique with bed mobility or transfers. Pt would continue to benefit from skilled physical therapy services at this time while admitted and after d/c to address the below listed limitations in order to improve overall safety and independence with functional mobility.     Follow Up Recommendations SNF    Equipment Recommendations  Rolling walker with 5" wheels    Recommendations for Other Services       Precautions / Restrictions Precautions Precautions: Fall Restrictions Weight Bearing Restrictions: No      Mobility  Bed Mobility Overal bed mobility: Needs Assistance Bed Mobility: Rolling Rolling: Supervision         General bed mobility comments: Pt rolled from supine to prone towards his R then swung LEs off EOB and lowered himself into tall kneeling position at EOB. For return to bed, pt jumped from standing into sidelying position in bed then quickly rolled to supine.  Transfers Overall transfer level: Needs assistance Equipment used: Rolling walker (2 wheeled)             General transfer comment:  Pt transitioning from quadruped position to standing via use of bilateral UEs on RW with min guard assist. Pt with unsafe technique and not receptive to instructions provided by therapist.  Ambulation/Gait Ambulation/Gait assistance: Min guard;+2 safety/equipment   Assistive device: Rolling walker (2 wheeled)   Gait velocity: decreased Gait velocity interpretation: <1.31 ft/sec, indicative of household ambulator General Gait Details: pt able to take 2-3 steps with heavy reliance on bilateral UEs on RW  Stairs            Wheelchair Mobility    Modified Rankin (Stroke Patients Only)       Balance Overall balance assessment: Needs assistance         Standing balance support: Bilateral upper extremity supported Standing balance-Leahy Scale: Poor                               Pertinent Vitals/Pain Pain Assessment: 0-10 Pain Score: 8  Pain Location: back Pain Descriptors / Indicators: Grimacing;Aching Pain Intervention(s): Monitored during session;Repositioned    Home Living Family/patient expects to be discharged to:: Private residence Living Arrangements: Parent Available Help at Discharge: Family Type of Home: House Home Access: Stairs to enter   Secretary/administrator of Steps: 2 Home Layout: One level Home Equipment: None      Prior Function Level of Independence: Independent               Hand Dominance        Extremity/Trunk Assessment   Upper Extremity Assessment Upper Extremity Assessment: Defer to OT evaluation  Lower Extremity Assessment Lower Extremity Assessment: Overall WFL for tasks assessed       Communication   Communication: No difficulties  Cognition Arousal/Alertness: Awake/alert Behavior During Therapy: Impulsive Overall Cognitive Status: No family/caregiver present to determine baseline cognitive functioning Area of Impairment: Attention;Following commands;Safety/judgement;Problem solving                    Current Attention Level: Selective   Following Commands: Follows one step commands inconsistently Safety/Judgement: Decreased awareness of safety;Decreased awareness of deficits   Problem Solving: Decreased initiation;Difficulty sequencing;Requires verbal cues        General Comments      Exercises     Assessment/Plan    PT Assessment Patient needs continued PT services  PT Problem List Decreased activity tolerance;Decreased mobility;Pain;Decreased balance;Decreased knowledge of use of DME;Decreased knowledge of precautions;Decreased safety awareness       PT Treatment Interventions DME instruction;Gait training;Functional mobility training;Therapeutic activities;Patient/family education;Stair training;Therapeutic exercise;Balance training;Neuromuscular re-education    PT Goals (Current goals can be found in the Care Plan section)  Acute Rehab PT Goals Patient Stated Goal: decrease pain PT Goal Formulation: With patient Time For Goal Achievement: 06/24/17 Potential to Achieve Goals: Fair    Frequency Min 2X/week   Barriers to discharge        Co-evaluation PT/OT/SLP Co-Evaluation/Treatment: Yes Reason for Co-Treatment: To address functional/ADL transfers;For patient/therapist safety;Necessary to address cognition/behavior during functional activity;Complexity of the patient's impairments (multi-system involvement) PT goals addressed during session: Mobility/safety with mobility;Balance;Proper use of DME;Strengthening/ROM OT goals addressed during session: Strengthening/ROM       AM-PAC PT "6 Clicks" Daily Activity  Outcome Measure Difficulty turning over in bed (including adjusting bedclothes, sheets and blankets)?: A Lot Difficulty moving from lying on back to sitting on the side of the bed? : A Lot Difficulty sitting down on and standing up from a chair with arms (e.g., wheelchair, bedside commode, etc,.)?: Unable Help needed moving to and from a bed  to chair (including a wheelchair)?: A Little Help needed walking in hospital room?: A Lot Help needed climbing 3-5 steps with a railing? : Total 6 Click Score: 11    End of Session Equipment Utilized During Treatment: Gait belt Activity Tolerance: Patient limited by pain Patient left: in bed;with call bell/phone within reach Nurse Communication: Mobility status PT Visit Diagnosis: Pain;Other abnormalities of gait and mobility (R26.89) Pain - part of body: (back)    Time: 0109-3235 PT Time Calculation (min) (ACUTE ONLY): 20 min   Charges:   PT Evaluation $PT Eval Moderate Complexity: 1 Mod     PT G Codes:        [place], PT, DPT [phone]   Alessandra Bevels Deaun Rocha 06/10/2017, 4:39 PM

## 2017-06-10 NOTE — Progress Notes (Signed)
[facility]    Date of Admission:  06/04/2017   Total days of antibiotics 6 Pen G          ID: Joshua Thomas is a 42 y.o. male with chronic hep C with epidural abscess and lumbar spinal osteo Principal Problem:   Acute osteomyelitis of lumbar spine (HCC) Active Problems:   Chronic hepatitis C virus genotype 1a infection (HCC)   Opioid use disorder, moderate, dependence (HCC)   GERD (gastroesophageal reflux disease)   Bipolar disorder (HCC)   Discitis   Osteomyelitis (HCC)    Subjective: Afebrile. Has good pain control  Medications:  . amLODipine  5 mg Oral Daily  . buprenorphine-naloxone  1 tablet Sublingual BID  . gabapentin  300 mg Oral TID  . insulin aspart  0-15 Units Subcutaneous TID WC  . insulin aspart  0-5 Units Subcutaneous QHS  . lidocaine  1 patch Transdermal Q24H  . methocarbamol  500 mg Oral TID  . nicotine  21 mg Transdermal Daily  . pantoprazole  40 mg Oral Daily  . polyethylene glycol  17 g Oral BID    Objective: Vital signs in last 24 hours: Temp:  [97.8 F (36.6 C)-98.5 F (36.9 C)] 98.2 F (36.8 C) (05/31 1426) Pulse Rate:  [93-103] 93 (05/31 1426) Resp:  [20] 20 (05/31 1426) BP: (118-141)/(81-95) 118/81 (05/31 1426) SpO2:  [94 %-100 %] 94 % (05/31 1426) Physical Exam  Constitutional: He is oriented to person, place, and time. He appears well-developed and well-nourished. No distress.  HENT:  Mouth/Throat: Oropharynx is clear and moist. No oropharyngeal exudate.  Cardiovascular: Normal rate, regular rhythm and normal heart sounds. Exam reveals no gallop and no friction rub.  No murmur heard.  Pulmonary/Chest: Effort normal and breath sounds normal. No respiratory distress. He has no wheezes.  Abdominal: Soft. Bowel sounds are normal. He exhibits no distension. There is no tenderness.  Lymphadenopathy:  He has no cervical adenopathy.  Neurological: He is alert and oriented to person, place, and time.  Skin: Skin  is warm and dry. No rash noted. No erythema.  Psychiatric: He has a normal mood and affect. His behavior is normal.     Lab Results Recent Labs    06/08/17 0531 06/09/17 0341 06/09/17 1112 06/10/17 0407  WBC 5.1 7.7  --  6.9  HGB 12.8* 12.8*  --  14.5  HCT 40.1 39.5  --  45.6  NA 137  --  135  --   K 4.4  --  4.3  --   CL 98*  --  102  --   CO2 31  --  25  --   BUN 13  --  15  --   CREATININE 0.92  --  0.93  --    Lab Results  Component Value Date   ESRSEDRATE 85 (H) 06/06/2017    Microbiology: Blood cx ngtd 5/29 aspirate ngtd in 48hrs Studies/Results: No results found.  Assessment/Plan: Strep viridans spinal lumbar discitis -osteomyelitis = will need IV abtx for minimum of 4 wk. I have discussed with Arsenio that the best way to do this is through having him going to a SNF/rehab since complicated to deliver at home.  He is amenable to this plan. Please have case manager start to look for spot. Wait for picc line placement until position can be found.  Pain management= They will also need to continue giving suboxone. We can arrange for him to be prescribed through  our providers  [facility] Cell: [phone] Pager: [phone]  06/10/2017, 6:17 PM

## 2017-06-10 NOTE — Evaluation (Signed)
Occupational Therapy Evaluation Patient Details Name: Joshua Thomas MRN: [id number] DOB: March 16, 1975 Today's Date: 06/10/2017    History of Present Illness 42 y.o. male with medical history significant for bipolar disorder, chronic low back pain, tobacco abuse, hepatitis C, polysubstance abuse disorder with IV drug abuse, and GERD who was recently discharged from [facility] on 05/26/2017 after treatment for acute discitis/osteomyelitis in his lumbar spine. Pt now s/p I&D of L2/3 disc space on 5/30.   Clinical Impression   Unsure of PLOF; pt is a poor historian but seems pt was independent with ADL and mobility PTA. Pt impulsive throughout session and at times becoming agitated with questions or suggestions on how to transfer from therapist. For OOB activity; pt rolled from supine to prone in bed, swung LEs off EOB, and assumed tall kneeling position at EOB. From there, pt going into quadruped position on floor then gradually pulling himself into standing with use of RW and min guard assist for safety. At this time recommending SNF for follow up due to safety concerns. Pt would benefit from continued skilled OT to address established goals.    Follow Up Recommendations  SNF    Equipment Recommendations  Other (comment)(TBD)    Recommendations for Other Services       Precautions / Restrictions Precautions Precautions: Fall Restrictions Weight Bearing Restrictions: No      Mobility Bed Mobility Overal bed mobility: Needs Assistance Bed Mobility: Rolling Rolling: Supervision         General bed mobility comments: Pt rolled from back to prone then swung LEs off EOB and lowered himself into tall kneeling position at EOB. For return to bed, pt jumped from standing into sidelying position in bed then quickly rolled to supine.  Transfers Overall transfer level: Needs assistance Equipment used: Rolling walker (2 wheeled)             General transfer comment: Pt transitioning from  quadruped position to standing via use of RW with min guard assist. Pt with unsafe technique and not listening to instructions provided by therapist.    Balance Overall balance assessment: Needs assistance         Standing balance support: Bilateral upper extremity supported Standing balance-Leahy Scale: Poor                             ADL either performed or assessed with clinical judgement   ADL Overall ADL's : Needs assistance/impaired                                       General ADL Comments: Pt not listening to instructions provided by therapist for bed mobility or transfers. Pt rolling in bed into prone position, swinging legs off EOB into kneeling, and then transferring onto all four extremities on the ground. From there he used the RW for pull self up into standing with min guard assist. Pt reporting this is the only way he can get up and refusing to attempt theapist recommended technique.     Vision         Perception     Praxis      Pertinent Vitals/Pain Pain Assessment: 0-10 Pain Score: 8  Pain Location: back Pain Descriptors / Indicators: Grimacing;Aching Pain Intervention(s): Monitored during session;Limited activity within patient's tolerance     Hand Dominance     Extremity/Trunk Assessment  Upper Extremity Assessment Upper Extremity Assessment: Overall WFL for tasks assessed   Lower Extremity Assessment Lower Extremity Assessment: Defer to PT evaluation       Communication Communication Communication: No difficulties   Cognition Arousal/Alertness: Awake/alert Behavior During Therapy: Impulsive Overall Cognitive Status: No family/caregiver present to determine baseline cognitive functioning Area of Impairment: Attention;Following commands;Safety/judgement;Problem solving                   Current Attention Level: Selective   Following Commands: Follows one step commands inconsistently Safety/Judgement:  Decreased awareness of safety;Decreased awareness of deficits   Problem Solving: Decreased initiation;Difficulty sequencing;Requires verbal cues     General Comments       Exercises     Shoulder Instructions      Home Living Family/patient expects to be discharged to:: Private residence Living Arrangements: Parent Available Help at Discharge: Family Type of Home: House Home Access: Stairs to enter Secretary/administrator of Steps: 2   Home Layout: One level     Bathroom Shower/Tub: Chief Strategy Officer: Standard     Home Equipment: None          Prior Functioning/Environment Level of Independence: Independent                 OT Problem List: Decreased strength;Decreased activity tolerance;Impaired balance (sitting and/or standing);Decreased cognition;Decreased knowledge of use of DME or AE;Decreased knowledge of precautions;Pain      OT Treatment/Interventions: Self-care/ADL training;Energy conservation;DME and/or AE instruction;Therapeutic activities;Cognitive remediation/compensation;Patient/family education;Balance training    OT Goals(Current goals can be found in the care plan section) Acute Rehab OT Goals Patient Stated Goal: decrease pain OT Goal Formulation: With patient Time For Goal Achievement: 06/24/17 Potential to Achieve Goals: Good ADL Goals Pt Will Perform Grooming: with modified independence;standing Pt Will Perform Upper Body Bathing: with modified independence;sitting Pt Will Perform Lower Body Bathing: with modified independence;sit to/from stand Pt Will Transfer to Toilet: with modified independence;ambulating;regular height toilet Pt Will Perform Toileting - Clothing Manipulation and hygiene: with modified independence;sit to/from stand  OT Frequency: Min 2X/week   Barriers to D/C:            Co-evaluation PT/OT/SLP Co-Evaluation/Treatment: Yes Reason for Co-Treatment: For patient/therapist safety   OT goals  addressed during session: Strengthening/ROM      AM-PAC PT "6 Clicks" Daily Activity     Outcome Measure Help from another person eating meals?: A Little Help from another person taking care of personal grooming?: A Little Help from another person toileting, which includes using toliet, bedpan, or urinal?: A Lot Help from another person bathing (including washing, rinsing, drying)?: A Lot Help from another person to put on and taking off regular upper body clothing?: A Lot Help from another person to put on and taking off regular lower body clothing?: A Lot 6 Click Score: 14   End of Session Nurse Communication: Mobility status  Activity Tolerance: Patient tolerated treatment well Patient left: in bed;with call bell/phone within reach  OT Visit Diagnosis: Other abnormalities of gait and mobility (R26.89);Pain Pain - part of body: (back)                Time: 1610-9604 OT Time Calculation (min): 20 min Charges:  OT General Charges $OT Visit: 1 Visit OT Evaluation $OT Eval Low Complexity: 1 Low G-Codes:     Gyselle Matthew A. Brett Albino, M.S., OTR/L Acute Rehab Department: [phone]  Gaye Alken 06/10/2017, 3:16 PM

## 2017-06-10 NOTE — Plan of Care (Signed)
  Problem: Clinical Measurements: Goal: Will remain free from infection Outcome: Progressing   Problem: Skin Integrity: Goal: Risk for impaired skin integrity will decrease Outcome: Progressing   

## 2017-06-10 NOTE — Progress Notes (Signed)
PROGRESS NOTE    Patient: Joshua Thomas     PCP: Patient, No Pcp Per                    DOB: November 10, 1975            DOA: 06/04/2017 ZOX:[id number]             DOS: 06/10/2017, 12:43 PM   LOS: 6 days   Date of Service: The patient was seen and examined on 06/10/2017  Subjective:  Status post I&D of L2/3  disc space, day #3 Patient was seen and examined this morning, stable no acute distress. No issues overnight.   Otherwise stable, on IV antibiotics of penicillin G   ----------------------------------------------------------------------------------------------------------------------  Brief Narrative:  Shonna Chock Petermanis a 42 y.o.malewith medical history significant forbipolar disorder, chronic low back pain, tobacco abuse, hepatitis C, polysubstance abuse disorder with IV drug abuse, and GERD whowas recently [facility] after treatment for acute discitis/osteomyelitis in his lumbar spine along with strep viridans bacteremia with plans to continue amoxicillin for a total of 6 weeks. He was also recently started on Suboxone a few days ago due to opioid use disorder. Hepresented again with acute flare of his chronic low back pain that has been ongoing for the last week. There was no signs of cauda equina with any bowel or bladder incontinence or saddle anesthesia. He states that he has been compliant with his home antibiotics and has recently used IV cocaine. He underwent an MRI of his low back earlier todaydemonstrating severe progressive discitis/osteomyelitis at L2/3 with epidural phlegmonous changes that appeared drainable with increase in size from prior exam. ED physician at that time consulted with neurosurgery Dr. Jordan Likes at [facility] who reviewed his MRI images with no acute surgical issue noted at thistime. Discussion was also had with ID physician Dr. Alphonsa Gin recommended withholding antibiotics at this time with transfer to [facility] for IR  consult and aspiration. It appears that patient's pain was not well controlled and became upset and left AMA. He has now returned with unrelenting pain and is agreeable for transfer. He denies any fevers, chills, focal motor weakness, numbness, or tingling in his extremities. -----------------------------------------------------------------------------------------------------------------------------------------  Principal Problem:   Acute osteomyelitis of lumbar spine (HCC) Active Problems:   Chronic hepatitis C virus genotype 1a infection (HCC)   Opioid use disorder, moderate, dependence (HCC)   GERD (gastroesophageal reflux disease)   Bipolar disorder (HCC)   Discitis   Osteomyelitis (HCC)   Assessment & Plan:    Severe progressive L2/3 discitis/osteomyelitis with epidural phlegmonous changes  Recent Viridans Strep Bacteremia MRI report reviewed see  Fluid culture negative to date, day 2 ID c/s recommending penicillin for recent viridans strep bacteremia.  Recommending parenteral therapy rather than d/c on oral antibiotics.  Continue suboxone.   -Restarted on IV antibiotics of penicillin recommending 6 weeks of IV antibiotic therapy since he failed oral therapy.   -06/08/17- Status post IR aspiration at L2-L3  Dilaudid PO with IV for breakthrough, toradol, kpad, lidocaine patch.  He's on suboxone for opiate dependence.  Bowel regimen.     Hypertension -PRN hydralazine, added Norvasc 5 mg p.o. daily Headaches:   Resolved, PRN Fioricet, monitoring closely Hypokalemia/hyponatremia - monitoring, repleted GERD. PPI Depression/bipolar.  Stable, will continue Cymbalta and Seroquel Opiate Dependence:  Stable, continue Suboxone follow outpatient Hepatitis C: outpatient follow up, no history of treatment HIV screening  -screening was negative  DVT prophylaxis: SCD Code Status: full  Family Communication: mothre at bedside Disposition Plan: pending further eval   Consultants:    ID /  neurosurgery   Procedures:   06/08/2017 -status post aspiration of the lesion of spine at L2/3  Antimicrobials:  Anti-infectives (From admission, onward)   Start     Dose/Rate Route Frequency Ordered Stop   06/09/17 1600  penicillin G potassium 3 Million Units in dextrose 5 % 100 mL IVPB     3 Million Units 200 mL/hr over 30 Minutes Intravenous Every 4 hours 06/09/17 1504     06/08/17 1330  penicillin G potassium 3 Million Units in dextrose 50mL IVPB  Status:  Discontinued     3 Million Units 100 mL/hr over 30 Minutes Intravenous Every 4 hours 06/08/17 1253 06/09/17 1504   06/04/17 1600  penicillin G potassium 3 Million Units in dextrose 50mL IVPB  Status:  Discontinued     3 Million Units 100 mL/hr over 30 Minutes Intravenous Every 4 hours 06/04/17 1443 06/07/17 0925       Objective: Vitals:   06/08/17 2033 06/09/17 0403 06/09/17 1930 06/10/17 0527  BP: (!) 144/92 (!) 140/100 (!) 141/90 (!) 138/95  Pulse: (!) 121 95 94 (!) 103  Resp: 20   20  Temp: 98.2 F (36.8 C) 98.3 F (36.8 C) 97.8 F (36.6 C) 98.5 F (36.9 C)  TempSrc: Oral Oral Oral Oral  SpO2: 100% 100% 97% 100%  Weight:      Height:        Intake/Output Summary (Last 24 hours) at 06/10/2017 1243 Last data filed at 06/10/2017 1100 Gross per 24 hour  Intake 800 ml  Output 1725 ml  Net -925 ml   Filed Weights   06/04/17 0219 06/08/17 0849  Weight: 72.6 kg (160 lb) 72.6 kg (160 lb)   BP (!) 138/95 (BP Location: Left Arm)   Pulse (!) 103   Temp 98.5 F (36.9 C) (Oral)   Resp 20   Ht 5\' 3"  (1.6 m)   Wt 72.6 kg (160 lb)   SpO2 100%   BMI 28.34 kg/m    Physical Exam  Constitution:  Alert, cooperative, no distress,  Psychiatric: Normal and stable mood and affect, cognition intact,   HEENT: Normocephalic, PERRL, otherwise with in Normal limits  Cardio vascular:  S1/S2, RRR, No murmure, No Rubs or Gallops  Chest/pulmonary: Clear to auscultation bilaterally, respirations unlabored  Chest  symmetric Abdomen: Soft, non-tender, non-distended, bowel sounds,no masses, no organomegaly Muscular skeletal: Limited exam - in bed, able to move all 4 extremities, Normal strength,  Neuro: CNII-XII intact. , normal motor and sensation, reflexes intact  Extremities edema lower extremities, +2 pulses  Skin: Dry, warm to touch,       Data Reviewed: I have personally reviewed following labs and imaging studies  CBC: Recent Labs  Lab 06/06/17 0327 06/07/17 0332 06/08/17 0531 06/09/17 0341 06/10/17 0407  WBC 4.9 4.7 5.1 7.7 6.9  HGB 12.2* 12.3* 12.8* 12.8* 14.5  HCT 38.0* 38.8* 40.1 39.5 45.6  MCV 94.1 93.7 93.5 93.4 94.2  PLT 230 232 222 245 267   Basic Metabolic Panel: Recent Labs  Lab 06/04/17 0447 06/05/17 0615 06/06/17 0327 06/07/17 0332 06/08/17 0531 06/09/17 1112  NA  --  136 137 137 137 135  K  --  4.5 3.8 4.7 4.4 4.3  CL  --  102 98* 100* 98* 102  CO2  --  26 28 29 31 25   GLUCOSE  --  104* 173* 175* 113* 122*  BUN  --  14 18 14 13 15   CREATININE 0.90 0.93 1.03 1.12 0.92 0.93  CALCIUM  --  9.5 9.3 9.4 9.7 9.7  MG 1.7 1.9 1.8 1.9 1.9  --    GFR: Estimated Creatinine Clearance: 92.5 mL/min (by C-G formula based on SCr of 0.93 mg/dL). Liver Function Tests: Recent Labs  Lab 06/06/17 0327 06/07/17 0332  AST 25 22  ALT 37 34  ALKPHOS 57 52  BILITOT 0.5 0.5  PROT 7.6 7.2  ALBUMIN 2.9* 2.8*   No results for input(s): LIPASE, AMYLASE in the last 168 hours. No results for input(s): AMMONIA in the last 168 hours. Coagulation Profile: Recent Labs  Lab 06/08/17 0531  INR 1.09  CBG: Recent Labs  Lab 06/09/17 0641 06/09/17 1135 06/09/17 2137 06/10/17 0657 06/10/17 1118  GLUCAP 122* 103* 171* 181* 367*    Recent Labs  Lab 06/03/17 1408  LATICACIDVEN 0.9    Recent Results (from the past 240 hour(s))  Culture, blood (routine x 2)     Status: None   Collection Time: 06/03/17 11:59 AM  Result Value Ref Range Status   Specimen Description BLOOD  RIGHT ARM  Final   Special Requests   Final    BOTTLES DRAWN AEROBIC AND ANAEROBIC Blood Culture adequate volume   Culture   Final    NO GROWTH 5 DAYS Performed at [facility], [address], [place], [place] [address]    Report Status 06/08/2017 FINAL  Final  Culture, blood (routine x 2)     Status: None   Collection Time: 06/03/17 12:02 PM  Result Value Ref Range Status   Specimen Description BLOOD RIGHT ARM  Final   Special Requests   Final    BOTTLES DRAWN AEROBIC AND ANAEROBIC Blood Culture adequate volume   Culture   Final    NO GROWTH 5 DAYS Performed at [facility], [address], [place], [place] [address]    Report Status 06/08/2017 FINAL  Final  Urine culture     Status: None   Collection Time: 06/03/17 12:20 PM  Result Value Ref Range Status   Specimen Description   Final    URINE, CLEAN CATCH Performed at [facility], [address], [place], [place] [address]    Special Requests   Final    NONE Performed at [facility], [address], [place], [place] [address]    Culture   Final    NO GROWTH Performed at [facility], [address] [place], [place] [address]    Report Status 06/04/2017 FINAL  Final  Aerobic/Anaerobic Culture (surgical/deep wound)     Status: None (Preliminary result)   Collection Time: 06/08/17 10:30 AM  Result Value Ref Range Status   Specimen Description ABSCESS VERTEBRA  Final   Special Requests NONE  Final   Gram Stain   Final    FEW WBC PRESENT, PREDOMINANTLY PMN NO ORGANISMS SEEN    Culture   Final    NO GROWTH 2 DAYS NO ANAEROBES ISOLATED; CULTURE IN PROGRESS FOR 5 DAYS Performed at [facility], [address] [place], [place] [address]    Report Status PENDING  Incomplete      Radiology Studies: No results found.  Scheduled Meds: . amLODipine  5 mg Oral Daily  . buprenorphine-naloxone  1 tablet Sublingual BID  . gabapentin  300 mg Oral TID  . insulin aspart  0-15 Units Subcutaneous TID WC  . insulin  aspart  0-5  Units Subcutaneous QHS  . lidocaine  1 patch Transdermal Q24H  . methocarbamol  500 mg Oral TID  . nicotine  21 mg Transdermal Daily  . pantoprazole  40 mg Oral Daily  . polyethylene glycol  17 g Oral BID   Continuous Infusions: . penicillin G 2.5 - 3.5 MILLION UNITS IVPB 3 Million Units (06/10/17 1227)    Time spent: >25 minutes  Kendell Bane, MD [facility],  Pager [phone]  If 7PM-7AM, please contact night-coverage [email or web address]   Password [facility]  06/10/2017, 12:43 PM

## 2017-06-11 LAB — CBC
HCT: 46.6 % (ref 39.0–52.0)
Hemoglobin: 14.8 g/dL (ref 13.0–17.0)
MCH: 30 pg (ref 26.0–34.0)
MCHC: 31.8 g/dL (ref 30.0–36.0)
MCV: 94.3 fL (ref 78.0–100.0)
Platelets: 304 10*3/uL (ref 150–400)
RBC: 4.94 MIL/uL (ref 4.22–5.81)
RDW: 12.9 % (ref 11.5–15.5)
WBC: 10.5 10*3/uL (ref 4.0–10.5)

## 2017-06-11 LAB — GLUCOSE, CAPILLARY
GLUCOSE-CAPILLARY: 217 mg/dL — AB (ref 65–99)
GLUCOSE-CAPILLARY: 155 mg/dL — AB (ref 65–99)
GLUCOSE-CAPILLARY: 170 mg/dL — AB (ref 65–99)
Glucose-Capillary: 217 mg/dL — ABNORMAL HIGH (ref 65–99)

## 2017-06-11 LAB — SEDIMENTATION RATE: Sed Rate: 44 mm/hr — ABNORMAL HIGH (ref 0–16)

## 2017-06-11 MED ORDER — LACTULOSE 10 GM/15ML PO SOLN
20.0000 g | ORAL | Status: AC
  Administered 2017-06-11: 20 g via ORAL
  Filled 2017-06-11: qty 30

## 2017-06-11 MED ORDER — SODIUM CHLORIDE 0.9 % IV SOLN
2.0000 g | INTRAVENOUS | Status: DC
  Administered 2017-06-11 – 2017-06-12 (×2): 2 g via INTRAVENOUS
  Filled 2017-06-11 (×3): qty 20

## 2017-06-11 MED ORDER — QUETIAPINE FUMARATE 50 MG PO TABS
50.0000 mg | ORAL_TABLET | Freq: Every day | ORAL | Status: DC
  Administered 2017-06-11 – 2017-06-22 (×11): 50 mg via ORAL
  Filled 2017-06-11 (×12): qty 1

## 2017-06-11 MED ORDER — VANCOMYCIN HCL IN DEXTROSE 750-5 MG/150ML-% IV SOLN
750.0000 mg | Freq: Three times a day (TID) | INTRAVENOUS | Status: DC
  Administered 2017-06-11 – 2017-06-13 (×5): 750 mg via INTRAVENOUS
  Filled 2017-06-11 (×6): qty 150

## 2017-06-11 MED ORDER — VANCOMYCIN HCL IN DEXTROSE 1-5 GM/200ML-% IV SOLN
1000.0000 mg | Freq: Once | INTRAVENOUS | Status: AC
  Administered 2017-06-11: 1000 mg via INTRAVENOUS
  Filled 2017-06-11: qty 200

## 2017-06-11 MED ORDER — BISACODYL 5 MG PO TBEC
10.0000 mg | DELAYED_RELEASE_TABLET | Freq: Two times a day (BID) | ORAL | Status: AC
  Administered 2017-06-11 – 2017-06-12 (×2): 10 mg via ORAL
  Filled 2017-06-11 (×2): qty 2

## 2017-06-11 MED ORDER — TRAZODONE HCL 100 MG PO TABS
100.0000 mg | ORAL_TABLET | Freq: Every evening | ORAL | Status: DC | PRN
  Administered 2017-06-11 – 2017-06-20 (×7): 100 mg via ORAL
  Filled 2017-06-11 (×7): qty 1

## 2017-06-11 MED ORDER — KETOROLAC TROMETHAMINE 15 MG/ML IJ SOLN
15.0000 mg | Freq: Three times a day (TID) | INTRAMUSCULAR | Status: AC
  Administered 2017-06-11 – 2017-06-16 (×15): 15 mg via INTRAVENOUS
  Filled 2017-06-11 (×15): qty 1

## 2017-06-11 NOTE — Progress Notes (Addendum)
Pharmacy Antibiotic Note  Joshua Thomas is a 42 y.o. male admitted on 06/04/2017 with ?sepsis.  Pharmacy has been consulted for vancomycin dosing.  Patient was on PenG for viridans strep bacteremia- plans for 6 weeks, but per ID having fevers and WBC continues to climb- count is WNL currently, however is up from admission.  SCr stable, CrCL ~90-5995mL/min.  Plan: Vancomycin 1g IV x1, then 750mg  IV q8h CTX 2g IV q24h per MD Follow c/s, clinical progression, renal function, level PRN Follow for changes to therapy or planned LOT  Height: 5\' 3"  (160 cm) Weight: 160 lb (72.6 kg) IBW/kg (Calculated) : 56.9  Temp (24hrs), Avg:98.2 F (36.8 C), Min:97.9 F (36.6 C), Max:98.4 F (36.9 C)  Recent Labs  Lab 06/05/17 0615 06/06/17 0327 06/07/17 0332 06/08/17 0531 06/09/17 0341 06/09/17 1112 06/10/17 0407 06/11/17 0828  WBC 5.9 4.9 4.7 5.1 7.7  --  6.9 10.5  CREATININE 0.93 1.03 1.12 0.92  --  0.93  --   --     Estimated Creatinine Clearance: 92.5 mL/min (by C-G formula based on SCr of 0.93 mg/dL).    Allergies  Allergen Reactions  . Acetaminophen Other (See Comments)    Due to Ulcers/ Liver Damage  . Buprenorphine Hcl Itching  . Meperidine Rash  . Morphine And Related Itching    PenG 5/25>> 6/1 Vancomycin 6/1>> Ceftriaxone 6/1>>  5/7 BCx: 2/2 Viridans strep 5/24 urine: neg 5/24 BCx: neg 5/29: Vertebra abscess: ngtd   Thank you for allowing pharmacy to be a part of this patient's care.  Chanda Laperle D. Rael Tilly, PharmD, BCPS Clinical Pharmacist [phone] Please check AMION for all [facility] numbers 06/11/2017 12:25 PM

## 2017-06-11 NOTE — Progress Notes (Signed)
ID PROGRESS NOTE  Patient remains afebrile but WBC and plt count continue to rise since admit. His aspirate cx was NGTD. Concern that we are missing pathogen despite being on penicillin for viridans strep species. Will opt to treat for culture negative osteomyelitis  Will start on vancomycin plus ceftriaxone. Will stop penicillin.  Duke Salviaynthia B. Drue SecondSnider MD MPH [facility] [phone]

## 2017-06-11 NOTE — Progress Notes (Addendum)
PROGRESS NOTE    Joshua Thomas  ZOX:[id number] DOB: Feb 28, 1975 DOA: 06/04/2017 PCP: Patient, No Pcp Per   Brief Narrative:42 y.o.malewith medical history significant forbipolar disorder, chronic low back pain, tobacco abuse, hepatitis C, polysubstance abuse disorder with IV drug abuse, and GERD whow[facility] after treatment for acute discitis/osteomyelitis in his lumbar spine along with strep viridans bacteremia with plans to continue amoxicillin for a total of 6 weeks. He was also recently started on Suboxone a few days ago due to opioid use disorder. Hepresented again with acute flare of his chronic low back pain that has been ongoing for the last week. There was no signs of cauda equina with any bowel or bladder incontinence or saddle anesthesia. He states that he has been compliant with his home antibiotics and has recently used IV cocaine. He underwent an MRI of his low back earlier todaydemonstrating severe progressive discitis/osteomyelitis at L2/3 with epidural phlegmonous changes that appeared drainable with increase in size from prior exam. ED physician at that time consulted with neurosurgery Dr. Jordan Likes at [facility] who reviewed his MRI images with no acute surgical issue noted at thistime. Discussion was also had with ID physician Dr. Alphonsa Gin recommended withholding antibiotics at this time with transfer to [facility] for IR consult and aspiration. It appears that patient's pain was not well controlled and became upset and left AMA. He has now returned with unrelenting pain and is agreeable for transfer. He denies any fevers, chills, focal motor weakness, numbness, or tingling in his extremities.    Assessment & Plan:   Principal Problem:   Acute osteomyelitis of lumbar spine (HCC) Active Problems:   Chronic hepatitis C virus genotype 1a infection (HCC)   Opioid use disorder, moderate, dependence (HCC)   GERD (gastroesophageal  reflux disease)   Bipolar disorder (HCC)   Discitis   Osteomyelitis (HCC)  Severe progressive L2/3 discitis/osteomyelitis with epidural phlegmonous changes  Recent Viridans Strep Bacteremia MRI report reviewed see  Fluid culture negative to date, day 3 ID c/s noted.  Penicillin being stopped today and Vanco and ceftriaxone being started for culture-negative osteomyelitis.  Patient continues to have increasing WBC count in spite of being on penicillin for viridans strep bacteremia. Recommending parenteral therapy rather than d/c on oral antibiotics. Continue suboxone.  - he failed oral therapy.   -06/08/17- Status post IR aspiration at L2-L3  DilaudidPO with IV for breakthrough, toradol, kpad, lidocaine patch. He's on suboxone for opiate dependence. Bowel regimen.    Hypertension -PRN hydralazine, added Norvasc 5 mg p.o. daily Headaches: Resolved, PRN Fioricet, monitoring closely Hypokalemia/hyponatremia - monitoring, repleted GERD. PPI Depression/bipolar.  Stable, will continue Cymbalta.. Restart Seroquel.  Opiate Dependence: Stable, continue Suboxone follow outpatient Hepatitis C: outpatient follow up, no history of treatment HIV screening  -screening was negative  Constipation start lactulose and Dulcolax.       DVT prophylaxis:scd Code Status: full Family Communication:none Disposition Plan:tbd  Consultants: id,neuro surg   Procedures 06/08/2017 -status post aspiration of the lesion of spine at L2/3   :Antimicrobials: Vanco and ceftriaxone.  Subjective: Patient is requesting his Robaxin dose to be increased but he has not been asking for Robaxin so he has not been getting them.  He is also requesting his Seroquel to be added to his medical regime he reports he has bipolar disorder and that he was on Seroquel but I do not see that in his list of medications.  He is complaining of constipation and he is  also requesting an anti-inflammatory like  Toradol.   Objective: Vitals:   06/10/17 1426 06/10/17 2100 06/11/17 0324 06/11/17 1056  BP: 118/81 115/89 128/83 128/83  Pulse: 93 68 (!) 123   Resp: 20 20    Temp: 98.2 F (36.8 C) 97.9 F (36.6 C) 98.4 F (36.9 C)   TempSrc: Oral Oral Oral   SpO2: 94% 98% 100%   Weight:      Height:        Intake/Output Summary (Last 24 hours) at 06/11/2017 1604 Last data filed at 06/10/2017 1700 Gross per 24 hour  Intake 240 ml  Output 250 ml  Net -10 ml   Filed Weights   06/04/17 0219 06/08/17 0849  Weight: 72.6 kg (160 lb) 72.6 kg (160 lb)    Examination:  General exam: Appears calm and comfortable  Respiratory system: Clear to auscultation. Respiratory effort normal. Cardiovascular system: S1 & S2 heard, RRR. No JVD, murmurs, rubs, gallops or clicks. No pedal edema. Gastrointestinal system: Abdomen is nondistended, soft and nontender. No organomegaly or masses felt. Normal bowel sounds heard. Central nervous system: Alert and oriented. No focal neurological deficits. Extremities: Symmetric 5 x 5 power. Skin: No rashes, lesions or ulcers Psychiatry: Judgement and insight appear normal. Mood & affect appropriate.     Data Reviewed: I have personally reviewed following labs and imaging studies  CBC: Recent Labs  Lab 06/07/17 0332 06/08/17 0531 06/09/17 0341 06/10/17 0407 06/11/17 0828  WBC 4.7 5.1 7.7 6.9 10.5  HGB 12.3* 12.8* 12.8* 14.5 14.8  HCT 38.8* 40.1 39.5 45.6 46.6  MCV 93.7 93.5 93.4 94.2 94.3  PLT 232 222 245 267 304   Basic Metabolic Panel: Recent Labs  Lab 06/05/17 0615 06/06/17 0327 06/07/17 0332 06/08/17 0531 06/09/17 1112  NA 136 137 137 137 135  K 4.5 3.8 4.7 4.4 4.3  CL 102 98* 100* 98* 102  CO2 26 28 29 31 25   GLUCOSE 104* 173* 175* 113* 122*  BUN 14 18 14 13 15   CREATININE 0.93 1.03 1.12 0.92 0.93  CALCIUM 9.5 9.3 9.4 9.7 9.7  MG 1.9 1.8 1.9 1.9  --    GFR: Estimated Creatinine Clearance: 92.5 mL/min (by C-G formula based on SCr of  0.93 mg/dL). Liver Function Tests: Recent Labs  Lab 06/06/17 0327 06/07/17 0332  AST 25 22  ALT 37 34  ALKPHOS 57 52  BILITOT 0.5 0.5  PROT 7.6 7.2  ALBUMIN 2.9* 2.8*   No results for input(s): LIPASE, AMYLASE in the last 168 hours. No results for input(s): AMMONIA in the last 168 hours. Coagulation Profile: Recent Labs  Lab 06/08/17 0531  INR 1.09   Cardiac Enzymes: No results for input(s): CKTOTAL, CKMB, CKMBINDEX, TROPONINI in the last 168 hours. BNP (last 3 results) No results for input(s): PROBNP in the last 8760 hours. HbA1C: No results for input(s): HGBA1C in the last 72 hours. CBG: Recent Labs  Lab 06/10/17 1118 06/10/17 1554 06/10/17 2147 06/11/17 0639 06/11/17 1149  GLUCAP 367* 114* 193* 217* 217*   Lipid Profile: No results for input(s): CHOL, HDL, LDLCALC, TRIG, CHOLHDL, LDLDIRECT in the last 72 hours. Thyroid Function Tests: No results for input(s): TSH, T4TOTAL, FREET4, T3FREE, THYROIDAB in the last 72 hours. Anemia Panel: No results for input(s): VITAMINB12, FOLATE, FERRITIN, TIBC, IRON, RETICCTPCT in the last 72 hours. Sepsis Labs: No results for input(s): PROCALCITON, LATICACIDVEN in the last 168 hours.  Recent Results (from the past 240 hour(s))  Culture, blood (routine x 2)  Status: None   Collection Time: 06/03/17 11:59 AM  Result Value Ref Range Status   Specimen Description BLOOD RIGHT ARM  Final   Special Requests   Final    BOTTLES DRAWN AEROBIC AND ANAEROBIC Blood Culture adequate volume   Culture   Final    NO GROWTH 5 DAYS Performed at [facility], [address] [place], [place] [address]    Report Status 06/08/2017 FINAL  Final  Culture, blood (routine x 2)     Status: None   Collection Time: 06/03/17 12:02 PM  Result Value Ref Range Status   Specimen Description BLOOD RIGHT ARM  Final   Special Requests   Final    BOTTLES DRAWN AEROBIC AND ANAEROBIC Blood Culture adequate volume   Culture   Final    NO GROWTH 5  DAYS Performed at [facility], [address] [place], [place] [address]    Report Status 06/08/2017 FINAL  Final  Urine culture     Status: None   Collection Time: 06/03/17 12:20 PM  Result Value Ref Range Status   Specimen Description   Final    URINE, CLEAN CATCH Performed at [facility], [address] [place], [place] [address]    Special Requests   Final    NONE Performed at [facility], [address] [place], [place] [address]    Culture   Final    NO GROWTH Performed at [facility], [address], [place], [place] [address]    Report Status 06/04/2017 FINAL  Final  Aerobic/Anaerobic Culture (surgical/deep wound)     Status: None (Preliminary result)   Collection Time: 06/08/17 10:30 AM  Result Value Ref Range Status   Specimen Description ABSCESS VERTEBRA  Final   Special Requests NONE  Final   Gram Stain   Final    FEW WBC PRESENT, PREDOMINANTLY PMN NO ORGANISMS SEEN    Culture   Final    NO GROWTH 3 DAYS NO ANAEROBES ISOLATED; CULTURE IN PROGRESS FOR 5 DAYS Performed at [facility], [address], [place], [place] [address]    Report Status PENDING  Incomplete         Radiology Studies: No results found.      Scheduled Meds: . amLODipine  5 mg Oral Daily  . buprenorphine-naloxone  1 tablet Sublingual BID  . gabapentin  300 mg Oral TID  . insulin aspart  0-15 Units Subcutaneous TID WC  . insulin aspart  0-5 Units Subcutaneous QHS  . lidocaine  1 patch Transdermal Q24H  . methocarbamol  500 mg Oral TID  . nicotine  21 mg Transdermal Daily  . pantoprazole  40 mg Oral Daily  . polyethylene glycol  17 g Oral BID   Continuous Infusions: . cefTRIAXone (ROCEPHIN)  IV Stopped (06/11/17 1330)  . vancomycin    . vancomycin       LOS: 7 days     Alwyn Ren, MD [facility] If 7PM-7AM, please contact night-coverage [email or web address] Password [email or web address] 06/11/2017, 4:04 PM

## 2017-06-12 LAB — BASIC METABOLIC PANEL
Anion gap: 8 (ref 5–15)
BUN: 23 mg/dL — AB (ref 6–20)
CHLORIDE: 98 mmol/L — AB (ref 101–111)
CO2: 31 mmol/L (ref 22–32)
CREATININE: 1.07 mg/dL (ref 0.61–1.24)
Calcium: 10.2 mg/dL (ref 8.9–10.3)
GFR calc Af Amer: >60 mL/min (ref >60)
GFR calc non Af Amer: >60 mL/min (ref >60)
Glucose, Bld: 148 mg/dL — ABNORMAL HIGH (ref 65–99)
Potassium: 4.7 mmol/L (ref 3.5–5.1)
SODIUM: 137 mmol/L (ref 135–145)

## 2017-06-12 LAB — GLUCOSE, CAPILLARY
GLUCOSE-CAPILLARY: 147 mg/dL — AB (ref 65–99)
GLUCOSE-CAPILLARY: 95 mg/dL (ref 65–99)
GLUCOSE-CAPILLARY: 156 mg/dL — AB (ref 65–99)
Glucose-Capillary: 310 mg/dL — ABNORMAL HIGH (ref 65–99)
Glucose-Capillary: 152 mg/dL — ABNORMAL HIGH (ref 65–99)

## 2017-06-12 MED ORDER — LACTULOSE 10 GM/15ML PO SOLN
20.0000 g | ORAL | Status: AC
  Administered 2017-06-12: 20 g via ORAL
  Filled 2017-06-12: qty 30

## 2017-06-12 NOTE — Progress Notes (Signed)
PROGRESS NOTE    Joshua Thomas  ZOX:[id number] DOB: Apr 08, 1975 DOA: 06/04/2017 PCP: Patient, No Pcp Per   Brief Narrative: 42 y.o.malewith medical history significant forbipolar disorder, chronic low back pain, tobacco abuse, hepatitis C, polysubstance abuse disorder with IV drug abuse, and GERD whowas recently [facility] after treatment for acute discitis/osteomyelitis in his lumbar spine along with strep viridans bacteremia with plans to continue amoxicillin for a total of 6 weeks. He was also recently started on Suboxone a few days ago due to opioid use disorder. Hepresented again with acute flare of his chronic low back pain that has been ongoing for the last week. There was no signs of cauda equina with any bowel or bladder incontinence or saddle anesthesia. He states that he has been compliant with his home antibiotics and has recently used IV cocaine. He underwent an MRI of his low back earlier todaydemonstrating severe progressive discitis/osteomyelitis at L2/3 with epidural phlegmonous changes that appeared drainable with increase in size from prior exam. ED physician at that time consulted with neurosurgery Dr. Jordan Likes at [facility] who reviewed his MRI images with no acute surgical issue noted at thistime. Discussion was also had with ID physician Dr. Alphonsa Gin recommended withholding antibiotics at this time with transfer to [facility] for IR consult and aspiration. It appears that patient's pain was not well controlled and became upset and left AMA. He has now returned with unrelenting pain and is agreeable for transfer. He denies any fevers, chills, focal motor weakness, numbness, or tingling in his extremities.     Assessment & Plan:   Principal Problem:   Acute osteomyelitis of lumbar spine (HCC) Active Problems:   Chronic hepatitis C virus genotype 1a infection (HCC)   Opioid use disorder, moderate, dependence (HCC)   GERD  (gastroesophageal reflux disease)   Bipolar disorder (HCC)   Discitis   Osteomyelitis (HCC)  Severe progressive L2/3 discitis/osteomyelitis with epidural phlegmonous changes  Recent Viridans Strep Bacteremia MRI report reviewed see  Fluid culture negative to date, day3 ID c/s noted.  Penicillin being stopped yesterday and Vanco and ceftriaxone being started for culture-negative osteomyelitis.  Patient continues to have increasing WBC count and thrombocytosis in spite of being on penicillin for viridans strep bacteremia. Recommending parenteral therapy rather than d/c on oral antibiotics. Continue suboxone. - he failed oral therapy. -06/08/17- Status post IR aspiration at L2-L3  DilaudidPO with IV for breakthrough, toradol, kpad, lidocaine patch. He's on suboxone for opiate dependence. Bowel regimen.   Hypertension-PRN hydralazine, added Norvasc 5 mg p.o. daily Headaches:Resolved,PRN Fioricet, monitoring closely Hypokalemia/hyponatremia -monitoring, repleted GERD. PPI Depression/bipolar.Stable,will continue Cymbalta.. Restart Seroquel.  Opiate Dependence:Stable, continue Suboxone follow outpatient Hepatitis C:outpatient follow up, no history of treatment HIV screening -screening was negative  Constipation start lactulose and Dulcolax. ?DM-his fasting blood sugar this morning is 310.  He denies having a history of diabetes but he never followed up with the doctor never checked.  However he reports having vision changes and  polyuria.  Will check hemoglobin A1C and start treatment as needed.      DVT prophylaxis: SCD ordered Code Status: Full code Family Communication: No family available Disposition Plan: TBD Consultants:  Infectious disease, neurosurgery Procedures: Status post aspiration of the lesion was L2-L3 spine on 06/08/2017 Antimicrobials: Vanco and ceftriaxone started  06/11/2017  Subjective: Reports that he is feeling a lot better than yesterday.   He had a bowel movement.  His pain is better controlled.  And he slept well after  restarting Seroquel.   Objective: Vitals:   06/11/17 0324 06/11/17 1056 06/11/17 1941 06/12/17 0624  BP: 128/83 128/83 122/89 (!) 142/99  Pulse: (!) 123  (!) 106 (!) 119  Resp:      Temp: 98.4 F (36.9 C)  98.4 F (36.9 C) 98.4 F (36.9 C)  TempSrc: Oral  Oral Oral  SpO2: 100%  100% 99%  Weight:      Height:        Intake/Output Summary (Last 24 hours) at 06/12/2017 1003 Last data filed at 06/12/2017 0303 Gross per 24 hour  Intake 250 ml  Output 400 ml  Net -150 ml   Filed Weights   06/04/17 0219 06/08/17 0849  Weight: 72.6 kg (160 lb) 72.6 kg (160 lb)    Examination:  General exam: Appears calm and comfortable  Respiratory system: Clear to auscultation. Respiratory effort normal. Cardiovascular system: S1 & S2 heard, RRR. No JVD, murmurs, rubs, gallops or clicks. No pedal edema. Gastrointestinal system: Abdomen is FIRM. nondistended, soft and nontender. No organomegaly or masses felt. Normal bowel sounds heard. Central nervous system: Alert and oriented. No focal neurological deficits. Extremities: Symmetric 5 x 5 power. Skin: No rashes, lesions or ulcers Psychiatry: Judgement and insight appear normal. Mood & affect appropriate.     Data Reviewed: I have personally reviewed following labs and imaging studies  CBC: Recent Labs  Lab 06/07/17 0332 06/08/17 0531 06/09/17 0341 06/10/17 0407 06/11/17 0828  WBC 4.7 5.1 7.7 6.9 10.5  HGB 12.3* 12.8* 12.8* 14.5 14.8  HCT 38.8* 40.1 39.5 45.6 46.6  MCV 93.7 93.5 93.4 94.2 94.3  PLT 232 222 245 267 304   Basic Metabolic Panel: Recent Labs  Lab 06/06/17 0327 06/07/17 0332 06/08/17 0531 06/09/17 1112 06/12/17 0344  NA 137 137 137 135 137  K 3.8 4.7 4.4 4.3 4.7  CL 98* 100* 98* 102 98*  CO2 28 29 31 25 31   GLUCOSE 173* 175* 113* 122* 148*  BUN 18 14 13 15  23*  CREATININE 1.03 1.12 0.92 0.93 1.07  CALCIUM 9.3 9.4 9.7 9.7 10.2    MG 1.8 1.9 1.9  --   --    GFR: Estimated Creatinine Clearance: 80.4 mL/min (by C-G formula based on SCr of 1.07 mg/dL). Liver Function Tests: Recent Labs  Lab 06/06/17 0327 06/07/17 0332  AST 25 22  ALT 37 34  ALKPHOS 57 52  BILITOT 0.5 0.5  PROT 7.6 7.2  ALBUMIN 2.9* 2.8*   No results for input(s): LIPASE, AMYLASE in the last 168 hours. No results for input(s): AMMONIA in the last 168 hours. Coagulation Profile: Recent Labs  Lab 06/08/17 0531  INR 1.09   Cardiac Enzymes: No results for input(s): CKTOTAL, CKMB, CKMBINDEX, TROPONINI in the last 168 hours. BNP (last 3 results) No results for input(s): PROBNP in the last 8760 hours. HbA1C: No results for input(s): HGBA1C in the last 72 hours. CBG: Recent Labs  Lab 06/11/17 1149 06/11/17 1626 06/11/17 2053 06/12/17 0626 06/12/17 0811  GLUCAP 217* 155* 170* 147* 310*   Lipid Profile: No results for input(s): CHOL, HDL, LDLCALC, TRIG, CHOLHDL, LDLDIRECT in the last 72 hours. Thyroid Function Tests: No results for input(s): TSH, T4TOTAL, FREET4, T3FREE, THYROIDAB in the last 72 hours. Anemia Panel: No results for input(s): VITAMINB12, FOLATE, FERRITIN, TIBC, IRON, RETICCTPCT in the last 72 hours. Sepsis Labs: No results for input(s): PROCALCITON, LATICACIDVEN in the last 168 hours.  Recent Results (from the past 240 hour(s))  Culture, blood (routine x  2)     Status: None   Collection Time: 06/03/17 11:59 AM  Result Value Ref Range Status   Specimen Description BLOOD RIGHT ARM  Final   Special Requests   Final    BOTTLES DRAWN AEROBIC AND ANAEROBIC Blood Culture adequate volume   Culture   Final    NO GROWTH 5 DAYS Performed at [facility], [address] [place], [place] [address]    Report Status 06/08/2017 FINAL  Final  Culture, blood (routine x 2)     Status: None   Collection Time: 06/03/17 12:02 PM  Result Value Ref Range Status   Specimen Description BLOOD RIGHT ARM  Final   Special Requests   Final     BOTTLES DRAWN AEROBIC AND ANAEROBIC Blood Culture adequate volume   Culture   Final    NO GROWTH 5 DAYS Performed at [facility], [address] [place], [place] [address]    Report Status 06/08/2017 FINAL  Final  Urine culture     Status: None   Collection Time: 06/03/17 12:20 PM  Result Value Ref Range Status   Specimen Description   Final    URINE, CLEAN CATCH Performed at [facility], [address] [place], [place] [address]    Special Requests   Final    NONE Performed at [facility], [address] [place], [place] [address]    Culture   Final    NO GROWTH Performed at [facility], [address] [place], [place] [address]    Report Status 06/04/2017 FINAL  Final  Aerobic/Anaerobic Culture (surgical/deep wound)     Status: None (Preliminary result)   Collection Time: 06/08/17 10:30 AM  Result Value Ref Range Status   Specimen Description ABSCESS VERTEBRA  Final   Special Requests NONE  Final   Gram Stain   Final    FEW WBC PRESENT, PREDOMINANTLY PMN NO ORGANISMS SEEN    Culture   Final    NO GROWTH 3 DAYS NO ANAEROBES ISOLATED; CULTURE IN PROGRESS FOR 5 DAYS Performed at [facility], [address] [place], [place] [address]    Report Status PENDING  Incomplete         Radiology Studies: No results found.      Scheduled Meds: . amLODipine  5 mg Oral Daily  . bisacodyl  10 mg Oral BID  . buprenorphine-naloxone  1 tablet Sublingual BID  . gabapentin  300 mg Oral TID  . insulin aspart  0-15 Units Subcutaneous TID WC  . insulin aspart  0-5 Units Subcutaneous QHS  . ketorolac  15 mg Intravenous Q8H  . lidocaine  1 patch Transdermal Q24H  . methocarbamol  500 mg Oral TID  . nicotine  21 mg Transdermal Daily  . pantoprazole  40 mg Oral Daily  . polyethylene glycol  17 g Oral BID  . QUEtiapine  50 mg Oral QHS   Continuous Infusions: . cefTRIAXone (ROCEPHIN)  IV Stopped (06/11/17 1330)  . vancomycin 750 mg (06/12/17 0717)      LOS: 8 days     Alwyn RenElizabeth G Adhya Cocco, MD [facility]  If 7PM-7AM, please contact night-coverage [email or web address] Password [email or web address] 06/12/2017, 10:03 AM

## 2017-06-12 NOTE — [place] FL2 (Signed)
[place] MEDICAID FL2 LEVEL OF CARE SCREENING TOOL     IDENTIFICATION  Patient Name: Joshua Thomas Birthdate: 12/12/1975 Sex: male Admission Date (Current Location): 06/04/2017  [facility]   Facility and Address:  [facility], [address], [place], [place] [address]      Provider Number: [id number]  Attending Physician Name and Address:  Alwyn RenMathews, Elizabeth G, MD  Relative Name and Phone Number:       Current Level of Care: Hospital Recommended Level of Care: Skilled Nursing Facility Prior Approval Number:    Date Approved/Denied:   PASRR Number: [id number]  Discharge Plan: SNF    Current Diagnoses: Patient Active Problem List   Diagnosis Date Noted  . Osteomyelitis (HCC) 06/04/2017  . Hyperglycemia 06/03/2017  . Discitis   . Streptococcal bacteremia   . GERD (gastroesophageal reflux disease) 05/17/2017  . Bipolar disorder (HCC) 05/17/2017  . Acute osteomyelitis of lumbar spine (HCC) 05/17/2017  . Cocaine abuse with cocaine-induced mood disorder (HCC) 01/20/2017  . Benzodiazepine abuse (HCC) 01/20/2017  . Cocaine use disorder, moderate, dependence (HCC) 05/23/2015  . Opioid use disorder, moderate, dependence (HCC) 05/23/2015  . 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 Substance or medication-induced bipolar and related disorder with onset during intoxication (HCC) 05/23/2015  . Chronic hepatitis C virus genotype 1a infection (HCC)   . 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    Orientation RESPIRATION BLADDER Height & Weight     Self, Time, Situation, Place  Normal Continent Weight: 160 lb (72.6 kg) Height:  5\' 3"  (160 cm)  BEHAVIORAL SYMPTOMS/MOOD NEUROLOGICAL BOWEL NUTRITION STATUS      Continent Diet(Heart healthy thin liquids)  AMBULATORY STATUS COMMUNICATION OF NEEDS Skin   Extensive Assist Verbally Surgical wounds(Closed incision on back, guaze dressing)                        Personal Care Assistance Level of Assistance  Bathing, Feeding, Dressing Bathing Assistance: Maximum assistance Feeding assistance: Independent Dressing Assistance: Maximum assistance     Functional Limitations Info  Sight, Hearing, Speech Sight Info: Adequate Hearing Info: Adequate Speech Info: Adequate    SPECIAL CARE FACTORS FREQUENCY  PT (By licensed PT), OT (By licensed OT)     PT Frequency: 2x OT Frequency: 2x            Contractures Contractures Info: Not present    Additional Factors Info  Code Status, Allergies, Isolation Precautions Code Status Info: Full Code Allergies Info: Acetaminophen, Buprenorphine Hcl, Meperidine, Morphine And Related     Isolation Precautions Info: Contact; MRSA     Current Medications (06/12/2017):  This is the current hospital active medication list Current Facility-Administered Medications  Medication Dose Route Frequency Provider Last Rate Last Dose  . acetaminophen (TYLENOL) tablet 650 mg  650 mg Oral Q6H PRN Maurilio LovelyShah, Pratik D, DO   650 mg at 06/11/17 [id number]   Or  . acetaminophen (TYLENOL) suppository 650 mg  650 mg Rectal Q6H PRN Sherryll BurgerShah, Pratik D, DO      . amLODipine (NORVASC) tablet 5 mg  5 mg Oral Daily Shahmehdi, Seyed A, MD   5 mg at 06/12/17 1050  . benzonatate (TESSALON) capsule 100 mg  100 mg Oral TID PRN Zigmund DanielPowell, A Caldwell Jr., MD      . buprenorphine-naloxone (SUBOXONE) 8-2 mg per SL tablet 1 tablet  1 tablet Sublingual BID Sherryll BurgerShah, Pratik D, DO  1 tablet at 06/12/17 1051  . cefTRIAXone (ROCEPHIN) 2 g in sodium chloride 0.9 % 100 mL IVPB  2 g Intravenous Q24H Judyann Munson, MD   Stopped at 06/11/17 1330  . gabapentin (NEURONTIN) capsule 300 mg  300 mg Oral TID Sherryll Burger, Pratik D, DO   300 mg at 06/12/17 1050  . HYDROmorphone (DILAUDID) injection 2 mg  2 mg Intravenous Q6H PRN Zigmund Daniel., MD   2 mg at 06/12/17 0717  . HYDROmorphone (DILAUDID) tablet 4 mg  4 mg Oral Q4H PRN Zigmund Daniel., MD    4 mg at 06/12/17 [phone]  . insulin aspart (novoLOG) injection 0-15 Units  0-15 Units Subcutaneous TID WC Shah, Pratik D, DO   11 Units at 06/12/17 0826  . insulin aspart (novoLOG) injection 0-5 Units  0-5 Units Subcutaneous QHS Shah, Pratik D, DO      . ketorolac (TORADOL) 15 MG/ML injection 15 mg  15 mg Intravenous Q8H Alwyn Ren, MD   15 mg at 06/12/17 0717  . lidocaine (LIDODERM) 5 % 1 patch  1 patch Transdermal Q24H Zigmund Daniel., MD   1 patch at 06/09/17 1344  . methocarbamol (ROBAXIN) tablet 500 mg  500 mg Oral TID Sherryll Burger, Pratik D, DO   500 mg at 06/12/17 1050  . nicotine (NICODERM CQ - dosed in mg/24 hours) patch 21 mg  21 mg Transdermal Daily Zigmund Daniel., MD   21 mg at 06/12/17 1056  . ondansetron (ZOFRAN) tablet 4 mg  4 mg Oral Q6H PRN Sherryll Burger, Pratik D, DO       Or  . ondansetron (ZOFRAN) injection 4 mg  4 mg Intravenous Q6H PRN Sherryll Burger, Pratik D, DO      . pantoprazole (PROTONIX) EC tablet 40 mg  40 mg Oral Daily Sherryll Burger, Pratik D, DO   40 mg at 06/12/17 1051  . polyethylene glycol (MIRALAX / GLYCOLAX) packet 17 g  17 g Oral BID Zigmund Daniel., MD   17 g at 06/12/17 1056  . QUEtiapine (SEROQUEL) tablet 50 mg  50 mg Oral QHS Alwyn Ren, MD   50 mg at 06/11/17 2215  . senna-docusate (Senokot-S) tablet 1 tablet  1 tablet Oral QHS PRN Sherryll Burger, Pratik D, DO      . traZODone (DESYREL) tablet 100 mg  100 mg Oral QHS PRN Alwyn Ren, MD   100 mg at 06/11/17 2231  . vancomycin (VANCOCIN) IVPB 750 mg/150 ml premix  750 mg Intravenous Q8H Bajbus, Lauren D, RPH 150 mL/hr at 06/12/17 0717 750 mg at 06/12/17 1191     Discharge Medications: Please see discharge summary for a list of discharge medications.  Relevant Imaging Results:  Relevant Lab Results:   Additional Information SSN: [id number]  Maree Krabbe, LCSW

## 2017-06-12 NOTE — Clinical Social Work Note (Signed)
CSW attempted to speak with pt however pt request CSW come back as he was "just waking up."  [place]inevilleBridget Thanh Pomerleau, LCSWA [phone]

## 2017-06-13 ENCOUNTER — Encounter (HOSPITAL_COMMUNITY): Payer: Self-pay | Admitting: *Deleted

## 2017-06-13 LAB — GLUCOSE, CAPILLARY
Glucose-Capillary: 127 mg/dL — ABNORMAL HIGH (ref 65–99)
Glucose-Capillary: 172 mg/dL — ABNORMAL HIGH (ref 65–99)
Glucose-Capillary: 123 mg/dL — ABNORMAL HIGH (ref 65–99)
Glucose-Capillary: 152 mg/dL — ABNORMAL HIGH (ref 65–99)
Glucose-Capillary: 149 mg/dL — ABNORMAL HIGH (ref 65–99)

## 2017-06-13 LAB — AEROBIC/ANAEROBIC CULTURE W GRAM STAIN (SURGICAL/DEEP WOUND): Culture: NO GROWTH

## 2017-06-13 LAB — BASIC METABOLIC PANEL
ANION GAP: 5 (ref 5–15)
BUN: 25 mg/dL — ABNORMAL HIGH (ref 6–20)
CALCIUM: 9.1 mg/dL (ref 8.9–10.3)
CO2: 28 mmol/L (ref 22–32)
Chloride: 103 mmol/L (ref 101–111)
Creatinine, Ser: 1.07 mg/dL (ref 0.61–1.24)
GFR calc non Af Amer: >60 mL/min (ref >60)
Glucose, Bld: 141 mg/dL — ABNORMAL HIGH (ref 65–99)
Potassium: 3.8 mmol/L (ref 3.5–5.1)
SODIUM: 136 mmol/L (ref 135–145)

## 2017-06-13 LAB — HEMOGLOBIN A1C
HEMOGLOBIN A1C: 6.6 % — AB (ref 4.8–5.6)
MEAN PLASMA GLUCOSE: 142.72 mg/dL

## 2017-06-13 MED ORDER — GLIPIZIDE 5 MG PO TABS
2.5000 mg | ORAL_TABLET | Freq: Every day | ORAL | Status: DC
  Administered 2017-06-14 – 2017-06-23 (×10): 2.5 mg via ORAL
  Filled 2017-06-13 (×10): qty 1

## 2017-06-13 MED ORDER — PENICILLIN G POTASSIUM 5000000 UNITS IJ SOLR
3.0000 10*6.[IU] | INTRAVENOUS | Status: DC
  Administered 2017-06-13 – 2017-06-23 (×59): 3 10*6.[IU] via INTRAVENOUS
  Filled 2017-06-13 (×75): qty 3

## 2017-06-13 MED ORDER — LIVING WELL WITH DIABETES BOOK
Freq: Once | Status: AC
  Administered 2017-06-13: 13:00:00
  Filled 2017-06-13: qty 1

## 2017-06-13 NOTE — Progress Notes (Signed)
PROGRESS NOTE    Joshua Thomas  RUE:[id number] DOB: 07-11-1975 DOA: 06/04/2017 PCP: Patient, No Pcp Per   Brief Narrative:42 y.o.malewith medical history significant forbipolar disorder, chronic low back pain, tobacco abuse, hepatitis C, polysubstance abuse disorder with IV drug abuse, and GERD whowas [facility] after treatment for acute discitis/osteomyelitis in his lumbar spine along with strep viridans bacteremia with plans to continue amoxicillin for a total of 6 weeks. He was also recently started on Suboxone a few days ago due to opioid use disorder. Hepresented again with acute flare of his chronic low back pain that has been ongoing for the last week. There was no signs of cauda equina with any bowel or bladder incontinence or saddle anesthesia. He states that he has been compliant with his home antibiotics and has recently used IV cocaine. He underwent an MRI of his low back earlier todaydemonstrating severe progressive discitis/osteomyelitis at L2/3 with epidural phlegmonous changes that appeared drainable with increase in size from prior exam. ED physician at that time consulted with neurosurgery Dr. Jordan Likes at [facility] who reviewed his MRI images with no acute surgical issue noted at thistime. Discussion was also had with ID physician Dr. Alphonsa Gin recommended withholding antibiotics at this time with transfer to [facility] for IR consult and aspiration. It appears that patient's pain was not well controlled and became upset and left AMA. He has now returned with unrelenting pain and is agreeable for transfer. He denies any fevers, chills, focal motor weakness, numbness, or tingling in his extremities.      Assessment & Plan:   Principal Problem:   Acute osteomyelitis of lumbar spine (HCC) Active Problems:   Chronic hepatitis C virus genotype 1a infection (HCC)   Opioid use disorder, moderate, dependence (HCC)   GERD  (gastroesophageal reflux disease)   Bipolar disorder (HCC)   Discitis   Osteomyelitis (HCC)  Severe progressive L2/3 discitis/osteomyelitis with epidural phlegmonous changes  Recent Viridans Strep Bacteremia Fluid culture negative to date, day5 ID c/snoted. Penicillin being stopped and Vanco and ceftriaxone being started for culture-negative osteomyelitis. Patient continues to have increasing WBC count and thrombocytosis in spite of being on penicillin forviridans strep bacteremia. Recommending parenteral therapy rather than d/c on oral antibiotics. Continue suboxone. - he failed oral therapy. -06/08/17- Status post IR aspiration at L2-L3  DilaudidPO with IV for breakthrough, toradol, kpad, lidocaine patch. He's on suboxone for opiate dependence. Bowel regimen.   Hypertension-PRN hydralazine, added Norvasc 5 mg p.o. daily Headaches:Resolved,PRN Fioricet, monitoring closely Hypokalemia/hyponatremia -monitoring, repleted GERD. PPI Depression/bipolar.Stable,will continue Cymbalta..Restart Seroquel. Opiate Dependence:Stable, continue Suboxone follow outpatient Hepatitis C:outpatient follow up, no history of treatment HIV screening -screening was negative Constipation start lactulose and Dulcolax. ?DM hemoglobin A1c 6.6 new diabetic.  Will get diabetic educator to speak with him.  We will start him on glipizide 2.5 mg daily.         DVT prophylaxis:scd  Code Status:full Family Communication:none Disposition Plan: tbd Consultants:  Id,neuro surgery Procedures: Status post aspiration of the lesion L2-L3 spine 06/08/2017. Antimicrobials: Vanco and ceftriaxone started 06/11/2017. Subjective: Resting in bed she is waking up denies any new complaints feeling better pain is better had bowel movements.   Objective: Vitals:   06/11/17 1941 06/12/17 0624 06/12/17 2021 06/13/17 0504  BP: 122/89 (!) 142/99 (!) 137/92 139/81  Pulse: (!) 106 (!) 119 (!)  101 92  Resp:      Temp: 98.4 F (36.9 C) 98.4 F (36.9 C) 98.7 F (37.1 C)  97.9 F (36.6 C)  TempSrc: Oral Oral Oral Axillary  SpO2: 100% 99% 100% 100%  Weight:      Height:       No intake or output data in the 24 hours ending 06/13/17 1103 Filed Weights   06/04/17 0219 06/08/17 0849  Weight: 72.6 kg (160 lb) 72.6 kg (160 lb)    Examination:  General exam: Appears calm and comfortable  Respiratory system: Clear to auscultation. Respiratory effort normal. Cardiovascular system: S1 & S2 heard, RRR. No JVD, murmurs, rubs, gallops or clicks. No pedal edema. Gastrointestinal system: Abdomen is nondistended, soft and nontender. No organomegaly or masses felt. Normal bowel sounds heard. Central nervous system: Alert and oriented. No focal neurological deficits. Extremities: Symmetric 5 x 5 power. Skin: No rashes, lesions or ulcers Psychiatry: Judgement and insight appear normal. Mood & affect appropriate.     Data Reviewed: I have personally reviewed following labs and imaging studies  CBC: Recent Labs  Lab 06/07/17 0332 06/08/17 0531 06/09/17 0341 06/10/17 0407 06/11/17 0828  WBC 4.7 5.1 7.7 6.9 10.5  HGB 12.3* 12.8* 12.8* 14.5 14.8  HCT 38.8* 40.1 39.5 45.6 46.6  MCV 93.7 93.5 93.4 94.2 94.3  PLT 232 222 245 267 304   Basic Metabolic Panel: Recent Labs  Lab 06/07/17 0332 06/08/17 0531 06/09/17 1112 06/12/17 0344 06/13/17 0422  NA 137 137 135 137 136  K 4.7 4.4 4.3 4.7 3.8  CL 100* 98* 102 98* 103  CO2 29 31 25 31 28   GLUCOSE 175* 113* 122* 148* 141*  BUN 14 13 15  23* 25*  CREATININE 1.12 0.92 0.93 1.07 1.07  CALCIUM 9.4 9.7 9.7 10.2 9.1  MG 1.9 1.9  --   --   --    GFR: Estimated Creatinine Clearance: 80.4 mL/min (by C-G formula based on SCr of 1.07 mg/dL). Liver Function Tests: Recent Labs  Lab 06/07/17 0332  AST 22  ALT 34  ALKPHOS 52  BILITOT 0.5  PROT 7.2  ALBUMIN 2.8*   No results for input(s): LIPASE, AMYLASE in the last 168 hours. No  results for input(s): AMMONIA in the last 168 hours. Coagulation Profile: Recent Labs  Lab 06/08/17 0531  INR 1.09   Cardiac Enzymes: No results for input(s): CKTOTAL, CKMB, CKMBINDEX, TROPONINI in the last 168 hours. BNP (last 3 results) No results for input(s): PROBNP in the last 8760 hours. HbA1C: Recent Labs    06/13/17 0422  HGBA1C 6.6*   CBG: Recent Labs  Lab 06/12/17 1144 06/12/17 1636 06/12/17 2024 06/13/17 0330 06/13/17 0651  GLUCAP 95 156* 152* 127* 172*   Lipid Profile: No results for input(s): CHOL, HDL, LDLCALC, TRIG, CHOLHDL, LDLDIRECT in the last 72 hours. Thyroid Function Tests: No results for input(s): TSH, T4TOTAL, FREET4, T3FREE, THYROIDAB in the last 72 hours. Anemia Panel: No results for input(s): VITAMINB12, FOLATE, FERRITIN, TIBC, IRON, RETICCTPCT in the last 72 hours. Sepsis Labs: No results for input(s): PROCALCITON, LATICACIDVEN in the last 168 hours.  Recent Results (from the past 240 hour(s))  Culture, blood (routine x 2)     Status: None   Collection Time: 06/03/17 11:59 AM  Result Value Ref Range Status   Specimen Description BLOOD RIGHT ARM  Final   Special Requests   Final    BOTTLES DRAWN AEROBIC AND ANAEROBIC Blood Culture adequate volume   Culture   Final    NO GROWTH 5 DAYS Performed at [facility], [address] [place], [place] [address]    Report  Status 06/08/2017 FINAL  Final  Culture, blood (routine x 2)     Status: None   Collection Time: 06/03/17 12:02 PM  Result Value Ref Range Status   Specimen Description BLOOD RIGHT ARM  Final   Special Requests   Final    BOTTLES DRAWN AEROBIC AND ANAEROBIC Blood Culture adequate volume   Culture   Final    NO GROWTH 5 DAYS Performed at [facility], [address] [place], [place] [address]    Report Status 06/08/2017 FINAL  Final  Urine culture     Status: None   Collection Time: 06/03/17 12:20 PM  Result Value Ref Range Status   Specimen Description   Final    URINE,  CLEAN CATCH Performed at [facility], [address], [place], [place] [address]    Special Requests   Final    NONE Performed at [facility], [address] [place], [place] [address]    Culture   Final    NO GROWTH Performed at [facility], [address] [place], [place] [address]    Report Status 06/04/2017 FINAL  Final  Aerobic/Anaerobic Culture (surgical/deep wound)     Status: None (Preliminary result)   Collection Time: 06/08/17 10:30 AM  Result Value Ref Range Status   Specimen Description ABSCESS VERTEBRA  Final   Special Requests NONE  Final   Gram Stain   Final    FEW WBC PRESENT, PREDOMINANTLY PMN NO ORGANISMS SEEN    Culture   Final    NO GROWTH 4 DAYS NO ANAEROBES ISOLATED; CULTURE IN PROGRESS FOR 5 DAYS Performed at [facility], [address], [place], [place] [address]    Report Status PENDING  Incomplete         Radiology Studies: No results found.      Scheduled Meds: . amLODipine  5 mg Oral Daily  . buprenorphine-naloxone  1 tablet Sublingual BID  . gabapentin  300 mg Oral TID  . insulin aspart  0-15 Units Subcutaneous TID WC  . insulin aspart  0-5 Units Subcutaneous QHS  . ketorolac  15 mg Intravenous Q8H  . lidocaine  1 patch Transdermal Q24H  . methocarbamol  500 mg Oral TID  . nicotine  21 mg Transdermal Daily  . pantoprazole  40 mg Oral Daily  . polyethylene glycol  17 g Oral BID  . QUEtiapine  50 mg Oral QHS   Continuous Infusions:   LOS: 9 days     Alwyn Ren, MD [facility] If 7PM-7AM, please contact night-coverage [email or web address] Password [email or web address] 06/13/2017, 11:03 AM

## 2017-06-13 NOTE — Plan of Care (Signed)
  Problem: Health Behavior/Discharge Planning: Goal: Ability to manage health-related needs will improve Outcome: Progressing   

## 2017-06-13 NOTE — Progress Notes (Signed)
[facility]  Date of Admission:  06/04/2017     Day 8 of antimicrobial therapy         ASSESSMENT/PLAN  [name] is a 42 y/o with history of substance abuse, bipolar, degenerative disc disease and Hepatitis C presenting with osteomyelitis/discitis with previous identifying Viridans Strep.  Repeat aspiration with no growth to date and remains pending. Currently receiving vancomycin and ceftriaxone. Previously failed outpatient treatment with amoxicillin likely related to decreased bone penetration. It has been discussed for IV antibiotics that he will need to go to SNF secondary to his recent drug use and concern for safety with a PICC line. Social work following for SNF placement with patient now placed on the Difficult to Place list secondary to being on Suboxone. .   1. Discontinue vancomycin and Zosyn. Restart Penicillin. Will need at least 6 weeks of IV therapy. If he is unwilling to go to a SNF it would be reasonable to prescribe levofloxacin as it has good bioavailability and bone penetration.  2. Continue to monitor cultures and WBC.  3. OUD managed with Suboxone and no current cravings. Continue per primary team.    Principal Problem:   Acute osteomyelitis of lumbar spine (HCC) Active Problems:   Chronic hepatitis C virus genotype 1a infection (HCC)   Opioid use disorder, moderate, dependence (HCC)   GERD (gastroesophageal reflux disease)   Bipolar disorder (HCC)   Discitis   Osteomyelitis (HCC)   . amLODipine  5 mg Oral Daily  . buprenorphine-naloxone  1 tablet Sublingual BID  . gabapentin  300 mg Oral TID  . insulin aspart  0-15 Units Subcutaneous TID WC  . insulin aspart  0-5 Units Subcutaneous QHS  . ketorolac  15 mg Intravenous Q8H  . lidocaine  1 patch Transdermal Q24H  . methocarbamol  500 mg Oral TID  . nicotine  21 mg Transdermal Daily  . pantoprazole  40 mg Oral Daily  . polyethylene glycol  17 g Oral BID  . QUEtiapine  50 mg  Oral QHS    SUBJECTIVE:  Afebrile overnight with boderline leukocytosis. Cultures remain without growth. Currently receiving vancomycin and ceftriaxone.   Back pain is feeling better and moving around more. Continues to require the walker for assistance. Denies fevers, chills, or night sweats.   Allergies  Allergen Reactions  . Acetaminophen Other (See Comments)    Due to Ulcers/ Liver Damage  . Buprenorphine Hcl Itching  . Meperidine Rash  . Morphine And Related Itching     Review of Systems: Review of Systems  Constitutional: Negative for diaphoresis, fever and malaise/fatigue.  Respiratory: Negative for cough, sputum production, shortness of breath and wheezing.   Cardiovascular: Negative for chest pain and leg swelling.  Gastrointestinal: Negative for abdominal pain, constipation, diarrhea, nausea and vomiting.  Genitourinary: Negative for dysuria, frequency, hematuria and urgency.  Musculoskeletal: Positive for back pain.  Skin: Negative for rash.      OBJECTIVE: Vitals:   06/11/17 1941 06/12/17 0624 06/12/17 2021 06/13/17 0504  BP: 122/89 (!) 142/99 (!) 137/92 139/81  Pulse: (!) 106 (!) 119 (!) 101 92  Resp:      Temp: 98.4 F (36.9 C) 98.4 F (36.9 C) 98.7 F (37.1 C) 97.9 F (36.6 C)  TempSrc: Oral Oral Oral Axillary  SpO2: 100% 99% 100% 100%  Weight:      Height:       Body mass index is 28.34 kg/m.  Physical Exam  Constitutional: He appears  well-developed and well-nourished. No distress.  Cardiovascular: Normal rate, regular rhythm, normal heart sounds and intact distal pulses.  Pulmonary/Chest: Effort normal and breath sounds normal.  Skin: Skin is warm and dry.  Psychiatric: He has a normal mood and affect. His behavior is normal.    Lab Results Lab Results  Component Value Date   WBC 10.5 06/11/2017   HGB 14.8 06/11/2017   HCT 46.6 06/11/2017   MCV 94.3 06/11/2017   PLT 304 06/11/2017    Lab Results  Component Value Date   CREATININE  1.07 06/13/2017   BUN 25 (H) 06/13/2017   NA 136 06/13/2017   K 3.8 06/13/2017   CL 103 06/13/2017   CO2 28 06/13/2017    Lab Results  Component Value Date   ALT 34 06/07/2017   AST 22 06/07/2017   ALKPHOS 52 06/07/2017   BILITOT 0.5 06/07/2017     Microbiology: Recent Results (from the past 240 hour(s))  Culture, blood (routine x 2)     Status: None   Collection Time: 06/03/17 11:59 AM  Result Value Ref Range Status   Specimen Description BLOOD RIGHT ARM  Final   Special Requests   Final    BOTTLES DRAWN AEROBIC AND ANAEROBIC Blood Culture adequate volume   Culture   Final    NO GROWTH 5 DAYS Performed at [facility], [address], [place], [place] [address]    Report Status 06/08/2017 FINAL  Final  Culture, blood (routine x 2)     Status: None   Collection Time: 06/03/17 12:02 PM  Result Value Ref Range Status   Specimen Description BLOOD RIGHT ARM  Final   Special Requests   Final    BOTTLES DRAWN AEROBIC AND ANAEROBIC Blood Culture adequate volume   Culture   Final    NO GROWTH 5 DAYS Performed at [facility], [address] [place], [place] [address]    Report Status 06/08/2017 FINAL  Final  Urine culture     Status: None   Collection Time: 06/03/17 12:20 PM  Result Value Ref Range Status   Specimen Description   Final    URINE, CLEAN CATCH Performed at [facility], [address] [place], [place] [address]    Special Requests   Final    NONE Performed at [facility], [address] [place], [place] [address]    Culture   Final    NO GROWTH Performed at [facility], [address], [place], [place] [address]    Report Status 06/04/2017 FINAL  Final  Aerobic/Anaerobic Culture (surgical/deep wound)     Status: None (Preliminary result)   Collection Time: 06/08/17 10:30 AM  Result Value Ref Range Status   Specimen Description ABSCESS VERTEBRA  Final   Special Requests NONE  Final   Gram Stain   Final    FEW WBC PRESENT, PREDOMINANTLY PMN NO  ORGANISMS SEEN    Culture   Final    NO GROWTH 4 DAYS NO ANAEROBES ISOLATED; CULTURE IN PROGRESS FOR 5 DAYS Performed at [facility], [address] [place], [place] [address]    Report Status PENDING  Incomplete     Marcos Eke, NP [facility] [phone] Pager  06/13/2017  9:53 AM

## 2017-06-13 NOTE — Social Work (Addendum)
CSW f/u on disposition. CSW discussed case with Asst. Clini. Dir. and since patient on suboxene, no SNF will take as there facility doctor will not prescribe suboxene. Pt will be placed on hospital difficult to place list as he will not be accepted by SNF due to medication need.  Pt is uninsured and will need rehabilitative therapies for back osteomyelitis and will/ need IV antibiobiotics as well..  CSW will continue to follow for disposition.  Keene BreathPatricia Rico Massar, LCSW Clinical Social Worker [phone]

## 2017-06-13 NOTE — Progress Notes (Signed)
Inpatient Diabetes Program Recommendations  AACE/ADA: New Consensus Statement on Inpatient Glycemic Control (2015)  Target Ranges:  Prepandial:   less than 140 mg/dL      Peak postprandial:   less than 180 mg/dL (1-2 hours)      Critically ill patients:  140 - 180 mg/dL   Spoke with patient about new diabetes diagnosis.  Discussed A1C results (6.6%) and explained what an A1C is. Discussed basic pathophysiology of DM Type 2.  Reviewed signs and symptoms of hyperglycemia and treatment. Discussed impact of nutrition, exercise, stress, sickness, and medications on diabetes control. RNs to provide ongoing basic DM education at bedside with this patient and engage patient to actively check blood glucose.   Will review hypoglycemia and CBG checks tomorrow and review diet.  Thanks, Christena DeemShannon Dakoda [place] RN, MSN, BC-ADM, [facility] Inpatient Diabetes Coordinator Team Pager [phone] (8a-5p)

## 2017-06-13 NOTE — Progress Notes (Signed)
Pharmacy Antibiotic Note  Joshua Thomas is a 42 y.o. male admitted on 06/04/2017 with ?sepsis.  Patient was on PenG for viridans strep bacteremia, but with fevers and leukocytosis, antibiotics were switched to vancomycin and ceftriaxone.  Now with improvement and Pharmacy to transition patient back to PenG.  Renal function stable, afebrile, WBC WNL.   Plan: PenG 3 million units IV Q4H Monitor renal fxn, clinical progress   Height: 5\' 3"  (160 cm) Weight: 160 lb (72.6 kg) IBW/kg (Calculated) : 56.9  Temp (24hrs), Avg:98.3 F (36.8 C), Min:97.9 F (36.6 C), Max:98.7 F (37.1 C)  Recent Labs  Lab 06/07/17 0332 06/08/17 0531 06/09/17 0341 06/09/17 1112 06/10/17 0407 06/11/17 0828 06/12/17 0344 06/13/17 0422  WBC 4.7 5.1 7.7  --  6.9 10.5  --   --   CREATININE 1.12 0.92  --  0.93  --   --  1.07 1.07    Estimated Creatinine Clearance: 80.4 mL/min (by C-G formula based on SCr of 1.07 mg/dL).    Allergies  Allergen Reactions  . Acetaminophen Other (See Comments)    Due to Ulcers/ Liver Damage  . Buprenorphine Hcl Itching  . Meperidine Rash  . Morphine And Related Itching    PenG 5/25>> 6/1, restart 6/3 Vanc 6/1>> 6/3 CTX 6/1>> 6/3  5/7 BCx: 2/2 Viridans strep 5/24 urine: neg 5/24 BCx: neg 5/29 vertebra abscess - NGTD HIV negative    Joshua Thomas, PharmD, BCPS, BCCCP Pager:  [phone] 06/13/2017, 11:18 AM

## 2017-06-13 NOTE — Progress Notes (Signed)
Occupational Therapy Treatment Patient Details Name: Joshua Thomas MRN: [id number] DOB: 03-03-1975 Today's Date: 06/13/2017    History of present illness Pt 42 y.o. male with medical history significant for bipolar disorder, chronic low back pain, tobacco abuse, hepatitis C, polysubstance abuse disorder with IV drug abuse, and GERD who was recently discharged from [facility] on 05/26/2017 after treatment for acute discitis/osteomyelitis in his lumbar spine. Pt now s/p I&D of L2/3 disc space on 5/30.   OT comments  Pt with improvements in mobility and cognition today. Pt following commands with decreased impulsivity and better safety awareness. Pt required supervision for bed mobility and min guard assist for functional mobility in room with use of RW. Pt required min assist for LB dressing. Attempted to educate pt on back precautions for comfort; pt verbalized understanding but breaking precautions during functional tasks. D/c plan remains appropriate. Will continue to follow acutely.   Follow Up Recommendations  SNF    Equipment Recommendations  Other (comment)(TBD at next venue)    Recommendations for Other Services      Precautions / Restrictions Precautions Precautions: Fall Restrictions Weight Bearing Restrictions: No       Mobility Bed Mobility Overal bed mobility: Needs Assistance Bed Mobility: Rolling;Sidelying to Sit;Sit to Supine Rolling: Supervision Sidelying to sit: Supervision   Sit to supine: Supervision   General bed mobility comments: Despite education pt with poor log roll technqiue. Increased time and effort but pt able to perform with supervision.  Transfers Overall transfer level: Needs assistance Equipment used: Rolling walker (2 wheeled) Transfers: Sit to/from Stand Sit to Stand: Min guard         General transfer comment: Cues for safe transfer technique with use of RW. Min guard for safety.    Balance Overall balance assessment: Needs  assistance Sitting-balance support: Feet supported;Bilateral upper extremity supported Sitting balance-Leahy Scale: Fair Sitting balance - Comments: Posterior lean in sitting   Standing balance support: Bilateral upper extremity supported Standing balance-Leahy Scale: Poor Standing balance comment: RW for support throughout                           ADL either performed or assessed with clinical judgement   ADL Overall ADL's : Needs assistance/impaired                     Lower Body Dressing: Minimal assistance;Bed level;Sit to/from stand Lower Body Dressing Details (indicate cue type and reason): Assist to start pants over feet and don socks, pt able to pull pants up in supine. Discussed back precautions for comfort but pt breaking throughout session. Toilet Transfer: Min guard;Ambulation;RW Toilet Transfer Details (indicate cue type and reason): Simulated by sit to stand with functional mobility in room.         Functional mobility during ADLs: Min guard;Rolling walker General ADL Comments: Attempted to educate pt on log roll and back precautions. Pt verbalizing understanding but breaking precautions throughout session.     Vision       Perception     Praxis      Cognition Arousal/Alertness: Awake/alert Behavior During Therapy: Impulsive Overall Cognitive Status: No family/caregiver present to determine baseline cognitive functioning                                 General Comments: Improved following commands and decreased impulsivitiy compared to last session. Pt continues to  want to do things his own way but is more receptive to therpist feedback regarding transfer and ADL technique        Exercises     Shoulder Instructions       General Comments      Pertinent Vitals/ Pain       Pain Assessment: 0-10 Pain Score: 7  Pain Location: back Pain Descriptors / Indicators: Aching;Discomfort Pain Intervention(s): Monitored  during session;Limited activity within patient's tolerance  Home Living                                          Prior Functioning/Environment              Frequency  Min 2X/week        Progress Toward Goals  OT Goals(current goals can now be found in the care plan section)  Progress towards OT goals: Progressing toward goals  Acute Rehab OT Goals Patient Stated Goal: decrease pain OT Goal Formulation: With patient  Plan Discharge plan remains appropriate    Co-evaluation                 AM-PAC PT "6 Clicks" Daily Activity     Outcome Measure   Help from another person eating meals?: None Help from another person taking care of personal grooming?: A Little Help from another person toileting, which includes using toliet, bedpan, or urinal?: A Little Help from another person bathing (including washing, rinsing, drying)?: A Little Help from another person to put on and taking off regular upper body clothing?: A Little Help from another person to put on and taking off regular lower body clothing?: A Little 6 Click Score: 19    End of Session Equipment Utilized During Treatment: Rolling walker  OT Visit Diagnosis: Other abnormalities of gait and mobility (R26.89);Pain Pain - part of body: (back)   Activity Tolerance Patient tolerated treatment well   Patient Left in bed;with call bell/phone within reach   Nurse Communication Mobility status        Time: [id number] OT Time Calculation (min): 11 min  Charges: OT General Charges $OT Visit: 1 Visit OT Treatments $Therapeutic Activity: 8-22 mins  Lakai Moree A. Brett Albinooffey, M.S., OTR/L Acute Rehab Department: [phone]   Gaye AlkenBailey A Iniko Robles 06/13/2017, 3:57 PM

## 2017-06-14 DIAGNOSIS — F1920 Other psychoactive substance dependence, uncomplicated: Secondary | ICD-10-CM

## 2017-06-14 LAB — BASIC METABOLIC PANEL
ANION GAP: 9 (ref 5–15)
BUN: 13 mg/dL (ref 6–20)
CO2: 31 mmol/L (ref 22–32)
Calcium: 9.6 mg/dL (ref 8.9–10.3)
Chloride: 99 mmol/L — ABNORMAL LOW (ref 101–111)
Creatinine, Ser: 0.92 mg/dL (ref 0.61–1.24)
GFR calc Af Amer: >60 mL/min (ref >60)
GFR calc non Af Amer: >60 mL/min (ref >60)
GLUCOSE: 142 mg/dL — AB (ref 65–99)
POTASSIUM: 3.8 mmol/L (ref 3.5–5.1)
Sodium: 139 mmol/L (ref 135–145)

## 2017-06-14 LAB — CBC WITH DIFFERENTIAL/PLATELET
Abs Immature Granulocytes: 0 K/uL (ref 0.0–0.1)
Basophils Absolute: 0 K/uL (ref 0.0–0.1)
Basophils Relative: 0 %
Eosinophils Absolute: 0.3 K/uL (ref 0.0–0.7)
Eosinophils Relative: 5 %
HCT: 40.4 % (ref 39.0–52.0)
Hemoglobin: 12.7 g/dL — ABNORMAL LOW (ref 13.0–17.0)
Immature Granulocytes: 0 %
Lymphocytes Relative: 42 %
Lymphs Abs: 2.3 K/uL (ref 0.7–4.0)
MCH: 30.2 pg (ref 26.0–34.0)
MCHC: 31.4 g/dL (ref 30.0–36.0)
MCV: 96 fL (ref 78.0–100.0)
Monocytes Absolute: 0.4 K/uL (ref 0.1–1.0)
Monocytes Relative: 8 %
Neutro Abs: 2.5 K/uL (ref 1.7–7.7)
Neutrophils Relative %: 45 %
Platelets: 194 K/uL (ref 150–400)
RBC: 4.21 MIL/uL — ABNORMAL LOW (ref 4.22–5.81)
RDW: 12.7 % (ref 11.5–15.5)
WBC: 5.6 K/uL (ref 4.0–10.5)

## 2017-06-14 LAB — GLUCOSE, CAPILLARY
GLUCOSE-CAPILLARY: 97 mg/dL (ref 65–99)
Glucose-Capillary: 136 mg/dL — ABNORMAL HIGH (ref 65–99)
Glucose-Capillary: 208 mg/dL — ABNORMAL HIGH (ref 65–99)
Glucose-Capillary: 128 mg/dL — ABNORMAL HIGH (ref 65–99)

## 2017-06-14 NOTE — Progress Notes (Signed)
PT Cancellation Note  Patient Details Name: Joshua Thomas MRN: [id number] DOB: 03/08/1975   Cancelled Treatment:    Reason Eval/Treat Not Completed: Pain limiting ability to participate. Pt refused to work with therapy today due to pain.    Taquita Demby L Cedra Villalon 06/14/2017, 12:08 PM

## 2017-06-14 NOTE — Progress Notes (Signed)
PROGRESS NOTE    Joshua Thomas  ZOX:[id number] DOB: Apr 30, 1975 DOA: 06/04/2017 PCP: Patient, No Pcp Per  Brief Narrative:42 y.o.malewith medical history significant forbipolar disorder, chronic low back pain, tobacco abuse, hepatitis C, polysubstance abuse disorder with IV drug abuse, and GERD [facility] treatment for acute discitis/osteomyelitis in his lumbar spine along with strep viridans bacteremia with plans to continue amoxicillin for a total of 6 weeks. He was also recently started on Suboxone a few days ago due to opioid use disorder. Hepresented again with acute flare of his chronic low back pain that has been ongoing for the last week. There was no signs of cauda equina with any bowel or bladder incontinence or saddle anesthesia. He states that he has been compliant with his home antibiotics and has recently used IV cocaine. He underwent an MRI of his low back earlier todaydemonstrating severe progressive discitis/osteomyelitis at L2/3 with epidural phlegmonous changes that appeared drainable with increase in size from prior exam. ED physician at that time consulted with neurosurgery Dr. Jordan Likes at [facility] who reviewed his MRI images with no acute surgical issue noted at thistime. Discussion was also had with ID physician Dr. Alphonsa Gin recommended withholding antibiotics at this time with transfer to [facility] for IR consult and aspiration. It appears that patient's pain was not well controlled and became upset and left AMA. He has now returned with unrelenting pain and is agreeable for transfer. He denies any fevers, chills, focal motor weakness, numbness, or tingling in his extremities.     Assessment & Plan:   Principal Problem:   Acute osteomyelitis of lumbar spine (HCC) Active Problems:   Chronic hepatitis C virus genotype 1a infection (HCC)   Opioid use disorder, moderate, dependence (HCC)   GERD  (gastroesophageal reflux disease)   Bipolar disorder (HCC)   Discitis   Osteomyelitis (HCC)  Severe progressive L2/3 discitis/osteomyelitis with epidural phlegmonous changes  Recent Viridans Strep Bacteremia Fluid culture negative to date, day6 ID c/snoted. Penicillin being restarted 06/13/2017 patient will need a total of 6 weeks.  Plan was to send him to SNF for IV antibiotics with a history of IV drug use and concern for safety with a PICC line in place.  But skilled nursing facilities have not accepted the patient due to the fact that he is on Suboxone.  He has been taking Suboxone as an outpatient.  To taper this  able to take at least 1 month per pharmacy.  By then he will almost be done with IV antibiotics.  So at this time it looks like he will be staying in the hospital to finish IV antibiotics.  He has not refused going to skilled nursing facility to me. - he failed oral therapy. -06/08/17- Status post IR aspiration at L2-L3 CBC pending for today.  DilaudidPO with IV for breakthrough, toradol, kpad, lidocaine patch. He's on suboxone for opiate dependence. Bowel regimen.   Hypertension-PRN hydralazine, added Norvasc 5 mg p.o. daily Headaches:Resolved,PRN Fioricet, monitoring closely Hypokalemia/hyponatremia -monitoring, repleted GERD. PPI Depression/bipolar.Stable,will continue Cymbalta..Restart Seroquel. Opiate Dependence:Stable, continue Suboxone follow outpatient Hepatitis C:outpatient follow up, no history of treatment HIV screening -screening was negative Constipation start lactulose and Dulcolax. DM hemoglobin A1c 6.6 new diabetic.  Will get diabetic educator to speak with him.  We will start him on glipizide 2.5 mg daily.        DVT prophylaxis: SCD Code Status full code Family Communication: None Disposition Plan: Plan was to discharge him to  a skilled nursing facility for long-term IV antibiotics for 6 weeks.  But since patient is on  Suboxone SNF do not accept.  Patient placed on difficult to place listed.  Consultants infectious disease Procedures: Status post aspiration of the lesion L2 and L3 spine 06/08/2017. Antimicrobials: Vancomycin and ceftriaxone started 06/11/2017.  Resting in bed feels well  Subjective: Pain better having bowel movements sleeping better after Seroquel has been restarted.  Objective: Vitals:   06/13/17 0504 06/13/17 1100 06/13/17 2030 06/14/17 0623  BP: 139/81 135/80 (!) 143/92 138/84  Pulse: 92 80 84 68  Resp:   18 17  Temp: 97.9 F (36.6 C) 98 F (36.7 C) 98.4 F (36.9 C)   TempSrc: Axillary Oral Oral Oral  SpO2: 100% 100% 98% 100%  Weight:      Height:        Intake/Output Summary (Last 24 hours) at 06/14/2017 1003 Last data filed at 06/13/2017 1100 Gross per 24 hour  Intake 140 ml  Output -  Net 140 ml   Filed Weights   06/04/17 0219 06/08/17 0849  Weight: 72.6 kg (160 lb) 72.6 kg (160 lb)    Examination:  General exam: Appears calm and comfortable  Respiratory system: Clear to auscultation. Respiratory effort normal. Cardiovascular system: S1 & S2 heard, RRR. No JVD, murmurs, rubs, gallops or clicks. No pedal edema. Gastrointestinal system: Abdomen is nondistended, soft and nontender. No organomegaly or masses felt. Normal bowel sounds heard. Central nervous system: Alert and oriented. No focal neurological deficits. Extremities: Symmetric 5 x 5 power. Skin: No rashes, lesions or ulcers Psychiatry: Judgement and insight appear normal. Mood & affect appropriate.     Data Reviewed: I have personally reviewed following labs and imaging studies  CBC: Recent Labs  Lab 06/08/17 0531 06/09/17 0341 06/10/17 0407 06/11/17 0828  WBC 5.1 7.7 6.9 10.5  HGB 12.8* 12.8* 14.5 14.8  HCT 40.1 39.5 45.6 46.6  MCV 93.5 93.4 94.2 94.3  PLT 222 245 267 304   Basic Metabolic Panel: Recent Labs  Lab 06/08/17 0531 06/09/17 1112 06/12/17 0344 06/13/17 0422  NA 137 135 137  136  K 4.4 4.3 4.7 3.8  CL 98* 102 98* 103  CO2 31 25 31 28   GLUCOSE 113* 122* 148* 141*  BUN 13 15 23* 25*  CREATININE 0.92 0.93 1.07 1.07  CALCIUM 9.7 9.7 10.2 9.1  MG 1.9  --   --   --    GFR: Estimated Creatinine Clearance: 80.4 mL/min (by C-G formula based on SCr of 1.07 mg/dL). Liver Function Tests: No results for input(s): AST, ALT, ALKPHOS, BILITOT, PROT, ALBUMIN in the last 168 hours. No results for input(s): LIPASE, AMYLASE in the last 168 hours. No results for input(s): AMMONIA in the last 168 hours. Coagulation Profile: Recent Labs  Lab 06/08/17 0531  INR 1.09   Cardiac Enzymes: No results for input(s): CKTOTAL, CKMB, CKMBINDEX, TROPONINI in the last 168 hours. BNP (last 3 results) No results for input(s): PROBNP in the last 8760 hours. HbA1C: Recent Labs    06/13/17 0422  HGBA1C 6.6*   CBG: Recent Labs  Lab 06/13/17 0651 06/13/17 1216 06/13/17 1629 06/13/17 2113 06/14/17 0615  GLUCAP 172* 123* 152* 149* 136*   Lipid Profile: No results for input(s): CHOL, HDL, LDLCALC, TRIG, CHOLHDL, LDLDIRECT in the last 72 hours. Thyroid Function Tests: No results for input(s): TSH, T4TOTAL, FREET4, T3FREE, THYROIDAB in the last 72 hours. Anemia Panel: No results for input(s): VITAMINB12, FOLATE, FERRITIN, TIBC, IRON, RETICCTPCT  in the last 72 hours. Sepsis Labs: No results for input(s): PROCALCITON, LATICACIDVEN in the last 168 hours.  Recent Results (from the past 240 hour(s))  Aerobic/Anaerobic Culture (surgical/deep wound)     Status: None   Collection Time: 06/08/17 10:30 AM  Result Value Ref Range Status   Specimen Description ABSCESS VERTEBRA  Final   Special Requests NONE  Final   Gram Stain   Final    FEW WBC PRESENT, PREDOMINANTLY PMN NO ORGANISMS SEEN    Culture   Final    No growth aerobically or anaerobically. Performed at [facility], [address] [place], [place] [address]    Report Status 06/13/2017 FINAL  Final          Radiology Studies: No results found.      Scheduled Meds: . amLODipine  5 mg Oral Daily  . buprenorphine-naloxone  1 tablet Sublingual BID  . gabapentin  300 mg Oral TID  . glipiZIDE  2.5 mg Oral QAC breakfast  . insulin aspart  0-15 Units Subcutaneous TID WC  . insulin aspart  0-5 Units Subcutaneous QHS  . ketorolac  15 mg Intravenous Q8H  . lidocaine  1 patch Transdermal Q24H  . methocarbamol  500 mg Oral TID  . nicotine  21 mg Transdermal Daily  . pantoprazole  40 mg Oral Daily  . polyethylene glycol  17 g Oral BID  . QUEtiapine  50 mg Oral QHS   Continuous Infusions: . penicillin G 2.5 - 3.5 MILLION UNITS IVPB 3 Million Units (06/14/17 [id number])     LOS: 10 days     Alwyn RenElizabeth G Mathews, MD [facility]  If 7PM-7AM, please contact night-coverage [email or web address] Password [email or web address] 06/14/2017, 10:03 AM

## 2017-06-14 NOTE — Progress Notes (Signed)
    [facility]   Reason for visit: Follow up on discitis  Interval History: some improvement though still with a lot of pain and muscle spasm; no fever.  No associated rash or diarrhea.  Some questions.    Physical Exam: Constitutional:  Vitals:   06/13/17 2030 06/14/17 0623  BP: (!) 143/92 138/84  Pulse: 84 68  Resp: 18 17  Temp: 98.4 F (36.9 C)   SpO2: 98% 100%   patient appears in NAD Eyes: anicteric HENT: no thrush Respiratory: Normal respiratory effort; CTA B Cardiovascular: RRR GI: soft, nt, nd  Review of Systems: Constitutional: negative for fevers and chills Gastrointestinal: negative for nausea and diarrhea Integument/breast: negative for rash  Lab Results  Component Value Date   WBC 10.5 06/11/2017   HGB 14.8 06/11/2017   HCT 46.6 06/11/2017   MCV 94.3 06/11/2017   PLT 304 06/11/2017    Lab Results  Component Value Date   CREATININE 1.07 06/13/2017   BUN 25 (H) 06/13/2017   NA 136 06/13/2017   K 3.8 06/13/2017   CL 103 06/13/2017   CO2 28 06/13/2017    Lab Results  Component Value Date   ALT 34 06/07/2017   AST 22 06/07/2017   ALKPHOS 52 06/07/2017     Microbiology: Recent Results (from the past 240 hour(s))  Aerobic/Anaerobic Culture (surgical/deep wound)     Status: None   Collection Time: 06/08/17 10:30 AM  Result Value Ref Range Status   Specimen Description ABSCESS VERTEBRA  Final   Special Requests NONE  Final   Gram Stain   Final    FEW WBC PRESENT, PREDOMINANTLY PMN NO ORGANISMS SEEN    Culture   Final    No growth aerobically or anaerobically. Performed at [facility], [address] [place], [place] [address]    Report Status 06/13/2017 FINAL  Final    Impression/Plan:  1. Discitis - Strep viridans.  No new culture growth.  He feels somewhat better.  Continue penicillin.    2.  Drug addiction - on suboxone and continuing for now.  3.  Disposition - unable to go to SNF with suboxone.   The patient tells me he is willing to just stop and take the withdrawal symptoms to go.   Another option is to keep him here 2-3 weeks and if he is doing well to discharge him with oral levaquin for 3-4 weeks for continuation.

## 2017-06-15 ENCOUNTER — Encounter (HOSPITAL_COMMUNITY): Payer: Self-pay | Admitting: General Practice

## 2017-06-15 LAB — BASIC METABOLIC PANEL
Anion gap: 5 (ref 5–15)
BUN: 15 mg/dL (ref 6–20)
CALCIUM: 9.1 mg/dL (ref 8.9–10.3)
CO2: 26 mmol/L (ref 22–32)
Chloride: 105 mmol/L (ref 101–111)
Creatinine, Ser: 0.86 mg/dL (ref 0.61–1.24)
GFR calc Af Amer: >60 mL/min (ref >60)
GLUCOSE: 187 mg/dL — AB (ref 65–99)
POTASSIUM: 4.5 mmol/L (ref 3.5–5.1)
Sodium: 136 mmol/L (ref 135–145)

## 2017-06-15 LAB — CBC
HEMATOCRIT: 39.4 % (ref 39.0–52.0)
Hemoglobin: 12.6 g/dL — ABNORMAL LOW (ref 13.0–17.0)
MCH: 30.1 pg (ref 26.0–34.0)
MCHC: 32 g/dL (ref 30.0–36.0)
MCV: 94.3 fL (ref 78.0–100.0)
Platelets: 180 10*3/uL (ref 150–400)
RBC: 4.18 MIL/uL — ABNORMAL LOW (ref 4.22–5.81)
RDW: 12.6 % (ref 11.5–15.5)
WBC: 5.2 10*3/uL (ref 4.0–10.5)

## 2017-06-15 LAB — GLUCOSE, CAPILLARY
GLUCOSE-CAPILLARY: 186 mg/dL — AB (ref 65–99)
GLUCOSE-CAPILLARY: 99 mg/dL (ref 65–99)
GLUCOSE-CAPILLARY: 131 mg/dL — AB (ref 65–99)
GLUCOSE-CAPILLARY: 156 mg/dL — AB (ref 65–99)

## 2017-06-15 MED ORDER — BUPRENORPHINE HCL-NALOXONE HCL 8-2 MG SL SUBL
1.0000 | SUBLINGUAL_TABLET | Freq: Every day | SUBLINGUAL | Status: DC
  Administered 2017-06-16 – 2017-06-23 (×8): 1 via SUBLINGUAL
  Filled 2017-06-15 (×8): qty 1

## 2017-06-15 NOTE — Progress Notes (Signed)
Physical Therapy Treatment Patient Details Name: Joshua Thomas MRN: [id number] DOB: 05-14-1975 Today's Date: 06/15/2017    History of Present Illness Pt 42 y.o. male with medical history significant for bipolar disorder, chronic low back pain, tobacco abuse, hepatitis C, polysubstance abuse disorder with IV drug abuse, and GERD who was recently discharged from [facility] on 05/26/2017 after treatment for acute discitis/osteomyelitis in his lumbar spine. Pt now s/p I&D of L2/3 disc space on 5/30.    PT Comments    Patient making progress towards goals. Increase in ambulation distance to 80 feet with RW and min assist for balance; one standing rest break required. Patient continues to be extremely guarded throughout gait with left foot drag noted. Rest of session focused on instructing patient on core stability exercises for pain control/management. SNF remains appropriate recommendation based on patient's risk for falls secondary to decreased safety awareness.   Follow Up Recommendations  SNF     Equipment Recommendations  Rolling walker with 5" wheels    Recommendations for Other Services       Precautions / Restrictions Precautions Precautions: Fall Restrictions Weight Bearing Restrictions: No    Mobility  Bed Mobility Overal bed mobility: Modified Independent                Transfers Overall transfer level: Needs assistance Equipment used: Rolling walker (2 wheeled) Transfers: Sit to/from Stand Sit to Stand: Min guard         General transfer comment: prefers to push up from RW with both hands  Ambulation/Gait Ambulation/Gait assistance: Min assist Ambulation Distance (Feet): 80 Feet Assistive device: Rolling walker (2 wheeled) Gait Pattern/deviations: Step-through pattern;Decreased dorsiflexion - left     General Gait Details: Patient very guarded with heavy reliance on RW. Occasional left foot drag and mild knee buckling; cues provided for increased foot  clearance. Required one standing rest break.   Stairs             Wheelchair Mobility    Modified Rankin (Stroke Patients Only)       Balance Overall balance assessment: Needs assistance Sitting-balance support: Feet supported;Bilateral upper extremity supported Sitting balance-Leahy Scale: Good     Standing balance support: Bilateral upper extremity supported Standing balance-Leahy Scale: Poor Standing balance comment: RW for support throughout                            Cognition Arousal/Alertness: Awake/alert Behavior During Therapy: Impulsive Overall Cognitive Status: No family/caregiver present to determine baseline cognitive functioning Area of Impairment: Attention;Following commands;Safety/judgement;Problem solving                   Current Attention Level: Selective   Following Commands: Follows one step commands with increased time Safety/Judgement: Decreased awareness of safety;Decreased awareness of deficits   Problem Solving: Decreased initiation;Difficulty sequencing;Requires verbal cues General Comments: Decreased impulsivity noted this session and benefits from direct, simple commands      Exercises Other Exercises Other Exercises: Transverse abdominus contractions x 5 (3 s holds)    General Comments  Instructed patient on back precautions for comfort but patient showed no evidence of learning as he broke them during mobility      Pertinent Vitals/Pain Pain Assessment: Faces Faces Pain Scale: Hurts little more Pain Location: back Pain Descriptors / Indicators: Aching;Discomfort Pain Intervention(s): Limited activity within patient's tolerance;Monitored during session    Home Living  Prior Function            PT Goals (current goals can now be found in the care plan section) Acute Rehab PT Goals Patient Stated Goal: decrease pain Potential to Achieve Goals: Fair Progress towards PT  goals: Progressing toward goals    Frequency    Min 2X/week      PT Plan Current plan remains appropriate    Co-evaluation              AM-PAC PT "6 Clicks" Daily Activity  Outcome Measure  Difficulty turning over in bed (including adjusting bedclothes, sheets and blankets)?: A Lot Difficulty moving from lying on back to sitting on the side of the bed? : A Lot Difficulty sitting down on and standing up from a chair with arms (e.g., wheelchair, bedside commode, etc,.)?: A Little Help needed moving to and from a bed to chair (including a wheelchair)?: A Little Help needed walking in hospital room?: A Little Help needed climbing 3-5 steps with a railing? : Total 6 Click Score: 14    End of Session Equipment Utilized During Treatment: Gait belt Activity Tolerance: Patient tolerated treatment well Patient left: in bed;with call bell/phone within reach Nurse Communication: Mobility status PT Visit Diagnosis: Pain;Other abnormalities of gait and mobility (R26.89) Pain - part of body: (back)     Time: [id number] PT Time Calculation (min) (ACUTE ONLY): 16 min  Charges:  $Gait Training: 8-22 mins                    G Codes:      Laurina Bustlearoline Dean Goldner, PT, DPT Acute Rehabilitation Services  Pager: [phone]  Vanetta MuldersCarloine H Jacquan Savas 06/15/2017, 2:35 PM

## 2017-06-15 NOTE — Progress Notes (Addendum)
PROGRESS NOTE    Joshua Thomas  BJY:[id number] DOB: 1975/09/06 DOA: 06/04/2017 PCP: Patient, No Pcp Per    Brief Narrative: Brief Narrative:42 y.o.malewith medical history significant forbipolar disorder, chronic low back pain, tobacco abuse, hepatitis C, polysubstance abuse disorder with IV drug abuse, and GERD whowas recently [facility] after treatment for acute discitis/osteomyelitis in his lumbar spine along with strep viridans bacteremia with plans to continue amoxicillin for a total of 6 weeks. He was also recently started on Suboxone a few days ago due to opioid use disorder. Hepresented again with acute flare of his chronic low back pain that has been ongoing for the last week. There was no signs of cauda equina with any bowel or bladder incontinence or saddle anesthesia. He states that he has been compliant with his home antibiotics and has recently used IV cocaine. He underwent an MRI of his low back earlier todaydemonstrating severe progressive discitis/osteomyelitis at L2/3 with epidural phlegmonous changes that appeared drainable with increase in size from prior exam. ED physician at that time consulted with neurosurgery Dr. Jordan Likes at [facility] who reviewed his MRI images with no acute surgical issue noted at thistime. Discussion was also had with ID physician Dr. Alphonsa Gin recommended withholding antibiotics at this time with transfer to [facility] for IR consult and aspiration. It appears that patient's pain was not well controlled and became upset and left AMA. He has now returned with unrelenting pain and is agreeable for transfer. He denies any fevers, chills, focal motor weakness, numbness, or tingling in his extremities.   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  [facility].  He is willing to be off suboxone  for time be at SNF/ will contact Dr [name]  On IV penicillin.   HTN;  Started on Norvasc  Headache; Fioricet PRN.   Hyponatremia. Follow trend.   Depression;  On Cymbalta.   Opiate Dependence:Stable, continue Suboxone follow outpatient Discussed with Dr Lou Miner, we could do short taper, suboxone 8 mg one tablet daily for 4- days then 4 mg tablet daily for couples days then stop. Monitor for craving and withdrawal symptoms. If he develops significant withdrawal symptoms we could put him back on suboxone.   Hepatitis C:outpatient follow up, no history of treatment  HIV screening -screening was negative  DM; started on glipizide.      DVT prophylaxis: scd Code Status: full code.  Family Communication: care discussed with patient.  Disposition Plan: remain inpatient for IV antibiotics.  Consultants:   ID.    Procedures:  Procedures: Status post aspiration of the lesion L2 and L3 spine 06/08/2017.  Antimicrobials:   Penicillin 6-03     Subjective: He is lying in bed. Gets pain when he walk.  He is willing to stop suboxone  to get in to SNF. But wants to be back on subaxon after SNF  Objective: Vitals:   06/14/17 1459 06/14/17 2100 06/15/17 0500 06/15/17 1100  BP: (!) 135/93 (!) 118/101 (!) 138/100 (!) 137/97  Pulse: 80 97 87 85  Resp: 18 15 14  18  Temp: 98.5 F (36.9 C) 97.9 F (36.6 C) 98.2 F (36.8 C) 98.6 F (37 C)  TempSrc: Oral Oral Oral Oral  SpO2: 98% 98% 99% 100%  Weight:      Height:        Intake/Output Summary (Last 24 hours) at 06/15/2017 1536 Last data filed at 06/15/2017 1249 Gross per 24 hour  Intake 1900 ml    Output 3200 ml  Net -1300 ml   Filed Weights   06/04/17 0219 06/08/17 0849  Weight: 72.6 kg (160 lb) 72.6 kg (160 lb)    Examination:  General exam: Appears calm and comfortable  Respiratory system: Clear to auscultation. Respiratory effort normal. Cardiovascular system: S1 & S2 heard, RRR. No JVD, murmurs, rubs, gallops or clicks. No pedal edema. Gastrointestinal system: Abdomen is nondistended, soft and nontender. No organomegaly or masses felt. Normal bowel sounds heard. Central nervous system: Alert and oriented. No focal neurological deficits. Extremities: Symmetric 5 x 5 power. Skin: No rashes, lesions or ulcers    Data Reviewed: I have personally reviewed following labs and imaging studies  CBC: Recent Labs  Lab 06/09/17 0341 06/10/17 0407 06/11/17 0828 06/14/17 1428 06/15/17 0343  WBC 7.7 6.9 10.5 5.6 5.2  NEUTROABS  --   --   --  2.5  --   HGB 12.8* 14.5 14.8 12.7* 12.6*  HCT 39.5 45.6 46.6 40.4 39.4  MCV 93.4 94.2 94.3 96.0 94.3  PLT 245 267 304 194 180   Basic Metabolic Panel: Recent Labs  Lab 06/09/17 1112 06/12/17 0344 06/13/17 0422 06/14/17 1428 06/15/17 0343  NA 135 137 136 139 136  K 4.3 4.7 3.8 3.8 4.5  CL 102 98* 103 99* 105  CO2 25 31 28 31 26   GLUCOSE 122* 148* 141* 142* 187*  BUN 15 23* 25* 13 15  CREATININE 0.93 1.07 1.07 0.92 0.86  CALCIUM 9.7 10.2 9.1 9.6 9.1   GFR: Estimated Creatinine Clearance: 100 mL/min (by C-G formula based on SCr of 0.86 mg/dL). Liver Function Tests: No results for input(s): AST, ALT, ALKPHOS, BILITOT, PROT, ALBUMIN in the last 168 hours. No results for input(s): LIPASE, AMYLASE in the last 168 hours. No results for input(s): AMMONIA in the last 168 hours. Coagulation Profile: No results for input(s): INR, PROTIME in the last 168 hours. Cardiac Enzymes: No results for input(s): CKTOTAL, CKMB, CKMBINDEX, TROPONINI in the last 168 hours. BNP (last 3 results) No results for input(s): PROBNP in the last  8760 hours. HbA1C: Recent Labs    06/13/17 0422  HGBA1C 6.6*   CBG: Recent Labs  Lab 06/14/17 0615 06/14/17 1146 06/14/17 1644 06/14/17 2005 06/15/17 0606  GLUCAP 136* 208* 97 128* 186*   Lipid Profile: No results for input(s): CHOL, HDL, LDLCALC, TRIG, CHOLHDL, LDLDIRECT in the last 72 hours. Thyroid Function Tests: No results for input(s): TSH, T4TOTAL, FREET4, T3FREE, THYROIDAB in the last 72 hours. Anemia Panel: No results for input(s): VITAMINB12, FOLATE, FERRITIN, TIBC, IRON, RETICCTPCT in the last 72 hours. Sepsis Labs: No results for input(s): PROCALCITON, LATICACIDVEN in the last 168 hours.  Recent Results (from the past 240 hour(s))  Aerobic/Anaerobic Culture (surgical/deep wound)     Status: None   Collection Time: 06/08/17 10:30 AM  Result Value Ref Range Status   Specimen Description ABSCESS VERTEBRA  Final   Special Requests NONE  Final   Gram Stain   Final    FEW WBC PRESENT, PREDOMINANTLY PMN NO ORGANISMS SEEN    Culture   Final  No growth aerobically or anaerobically. Performed at [facility], [address] [place], [place] [address]    Report Status 06/13/2017 FINAL  Final         Radiology Studies: No results found.      Scheduled Meds: . amLODipine  5 mg Oral Daily  . buprenorphine-naloxone  1 tablet Sublingual BID  . gabapentin  300 mg Oral TID  . glipiZIDE  2.5 mg Oral QAC breakfast  . insulin aspart  0-15 Units Subcutaneous TID WC  . insulin aspart  0-5 Units Subcutaneous QHS  . ketorolac  15 mg Intravenous Q8H  . lidocaine  1 patch Transdermal Q24H  . methocarbamol  500 mg Oral TID  . nicotine  21 mg Transdermal Daily  . pantoprazole  40 mg Oral Daily  . polyethylene glycol  17 g Oral BID  . QUEtiapine  50 mg Oral QHS   Continuous Infusions: . penicillin G 2.5 - 3.5 MILLION UNITS IVPB Stopped (06/15/17 1215)     LOS: 11 days    Time spent: 35 minutes.     Alba CoryBelkys A Regalado, MD [facility] Pager  [phone]  If 7PM-7AM, please contact night-coverage [email or web address] Password [email or web address] 06/15/2017, 3:36 PM

## 2017-06-15 NOTE — Social Work (Signed)
CSW f/u on disposition.  CSW sent referral to [facility] and leadership is working with administration at SNF to see if they can offer a SNF bed.  CSW will continue to follow.  Keene BreathPatricia Otto Caraway, LCSW Clinical Social Worker [phone]

## 2017-06-16 LAB — CBC
HCT: 40.5 % (ref 39.0–52.0)
Hemoglobin: 12.9 g/dL — ABNORMAL LOW (ref 13.0–17.0)
MCH: 30.4 pg (ref 26.0–34.0)
MCHC: 31.9 g/dL (ref 30.0–36.0)
MCV: 95.3 fL (ref 78.0–100.0)
Platelets: 189 10*3/uL (ref 150–400)
RBC: 4.25 MIL/uL (ref 4.22–5.81)
RDW: 12.6 % (ref 11.5–15.5)
WBC: 5.6 10*3/uL (ref 4.0–10.5)

## 2017-06-16 LAB — BASIC METABOLIC PANEL
Anion gap: 9 (ref 5–15)
BUN: 14 mg/dL (ref 6–20)
CALCIUM: 9.5 mg/dL (ref 8.9–10.3)
CO2: 24 mmol/L (ref 22–32)
Chloride: 103 mmol/L (ref 101–111)
Creatinine, Ser: 0.89 mg/dL (ref 0.61–1.24)
GFR calc Af Amer: >60 mL/min (ref >60)
GLUCOSE: 206 mg/dL — AB (ref 65–99)
Potassium: 4.5 mmol/L (ref 3.5–5.1)
Sodium: 136 mmol/L (ref 135–145)

## 2017-06-16 LAB — GLUCOSE, CAPILLARY
GLUCOSE-CAPILLARY: 187 mg/dL — AB (ref 65–99)
GLUCOSE-CAPILLARY: 183 mg/dL — AB (ref 65–99)

## 2017-06-16 MED ORDER — IBUPROFEN 200 MG PO TABS
400.0000 mg | ORAL_TABLET | Freq: Four times a day (QID) | ORAL | Status: DC | PRN
  Administered 2017-06-16 – 2017-06-17 (×2): 400 mg via ORAL
  Filled 2017-06-16 (×3): qty 2

## 2017-06-16 NOTE — Progress Notes (Signed)
OT Cancellation Note  Patient Details Name: Joshua SleightLee W Fries MRN: [id number] DOB: 03/24/1975   Cancelled Treatment:    Reason Eval/Treat Not Completed: Patient declined, no reason specified; pt declining OT tx due to pain, reports he is willing to participate later today once he receives pain meds (reports he is not able to receive more for approx 1 more hour). Will check back as schedule allows.  Marcy SirenBreanna Eulah Walkup, OT Pager [phone] 06/16/2017   Orlando PennerBreanna L Bradshaw Minihan 06/16/2017, 4:00 PM

## 2017-06-16 NOTE — Progress Notes (Signed)
PROGRESS NOTE    Joshua Thomas  ZOX:[id number] DOB: 10/01/1975 DOA: 06/04/2017 PCP: Patient, No Pcp Per    Brief Narrative: Brief Narrative:42 y.o.malewith medical history significant forbipolar disorder, chronic low back pain, tobacco abuse, hepatitis C, polysubstance abuse disorder with IV drug abuse, and GERD whowas recently [facility] after treatment for acute discitis/osteomyelitis in his lumbar spine along with strep viridans bacteremia with plans to continue amoxicillin for a total of 6 weeks. He was also recently started on Suboxone a few days ago due to opioid use disorder. Hepresented again with acute flare of his chronic low back pain that has been ongoing for the last week. There was no signs of cauda equina with any bowel or bladder incontinence or saddle anesthesia. He states that he has been compliant with his home antibiotics and has recently used IV cocaine. He underwent an MRI of his low back earlier todaydemonstrating severe progressive discitis/osteomyelitis at L2/3 with epidural phlegmonous changes that appeared drainable with increase in size from prior exam. ED physician at that time consulted with [facility]surgery [facility] who reviewed his MRI images with no acute surgical issue noted at thistime. Discussion was also had with ID physician Dr. Alphonsa GinSniderwho recommended withholding antibiotics at this time with [facility] and [facility]. It appears that patient's pain was not well controlled and became upset and left AMA. He has now returned with unrelenting pain and is agreeable for transfer. He denies any fevers, chills, focal motor weakness, numbness, or tingling in his extremities.   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  [facility].  He is willing to be off suboxone  for time be at SNF/ will contact Dr Mikey BussingHoffman [name]  On IV penicillin.  Star ibuprofen for pain prn. He relates Toradol helps with pain   HTN;  Started on Norvasc  Headache; Fioricet PRN.   Hyponatremia. Follow trend.   Depression;  On Cymbalta.   Opiate Dependence:Stable, continue Suboxone follow outpatient Discussed with Dr Lou MinerHuffman, we could do short taper, suboxone 8 mg one tablet daily for 4- days then 4 mg tablet daily for couples days then stop. Monitor for craving and withdrawal symptoms. If he develops significant withdrawal symptoms we could put him back on suboxone.  Tolerating taper.   Hepatitis C:outpatient follow up, no history of treatment  HIV screening -screening was negative  DM; started on glipizide.      DVT prophylaxis: scd Code Status: full code.  Family Communication: care discussed with patient.  Disposition Plan: remain inpatient for IV antibiotics.  Consultants:   ID.    Procedures:  Procedures: Status post aspiration of the lesion L2 and L3 spine 06/08/2017.  Antimicrobials:   Penicillin 6-03     Subjective: Doing well, Toradol helps with pain, but can not get it anymore. Received toradol for 5 days.    Objective: Vitals:   06/08/17 0849 06/15/17 0500 06/15/17 1100 06/15/17 2134  BP:  (!) 138/100 (!) 137/97 (!) 155/90  Pulse:  87 85 88  Resp:  14 18 18  Temp:  98.2 F (36.8 C) 98.6 F (37 C) 98.4 F (36.9 C)  TempSrc:  Oral Oral Oral  SpO2:  99% 100% 100%  Weight: 72.6 kg (160 lb)     Height: 5\' 3"  (1.6 m)       Intake/Output Summary (Last 24 hours) at 06/16/2017 1610 Last data filed at 06/16/2017  1300 Gross per 24 hour  Intake 1440 ml  Output 3300 ml  Net -1860 ml   Filed Weights   06/04/17 0219 06/08/17 0849  Weight: 72.6 kg (160 lb) 72.6 kg (160 lb)    Examination:  General exam: NAD Respiratory system: CTA Cardiovascular system: S 1, S 2 RRR Gastrointestinal system: BS present, soft,  Central nervous system: non focal.  Extremities: Symmetric 5 x 5 power. Skin: No rashes, lesions or ulcers    Data Reviewed: I have personally reviewed following labs and imaging studies  CBC: Recent Labs  Lab 06/10/17 0407 06/11/17 0828 06/14/17 1428 06/15/17 0343 06/16/17 0308  WBC 6.9 10.5 5.6 5.2 5.6  NEUTROABS  --   --  2.5  --   --   HGB 14.5 14.8 12.7* 12.6* 12.9*  HCT 45.6 46.6 40.4 39.4 40.5  MCV 94.2 94.3 96.0 94.3 95.3  PLT 267 304 194 180 189   Basic Metabolic Panel: Recent Labs  Lab 06/12/17 0344 06/13/17 0422 06/14/17 1428 06/15/17 0343 06/16/17 0308  NA 137 136 139 136 136  K 4.7 3.8 3.8 4.5 4.5  CL 98* 103 99* 105 103  CO2 31 28 31 26 24   GLUCOSE 148* 141* 142* 187* 206*  BUN 23* 25* 13 15 14   CREATININE 1.07 1.07 0.92 0.86 0.89  CALCIUM 10.2 9.1 9.6 9.1 9.5   GFR: Estimated Creatinine Clearance: 96.7 mL/min (by C-G formula based on SCr of 0.89 mg/dL). Liver Function Tests: No results for input(s): AST, ALT, ALKPHOS, BILITOT, PROT, ALBUMIN in the last 168 hours. No results for input(s): LIPASE, AMYLASE in the last 168 hours. No results for input(s): AMMONIA in the last 168 hours. Coagulation Profile: No results for input(s): INR, PROTIME in the last 168 hours. Cardiac Enzymes: No results for input(s): CKTOTAL, CKMB, CKMBINDEX, TROPONINI in the last 168 hours. BNP (last 3 results) No results for input(s): PROBNP in the last 8760 hours. HbA1C: No results for input(s): HGBA1C in the last 72 hours. CBG: Recent Labs  Lab 06/15/17 0606 06/15/17 1241 06/15/17 1645 06/15/17 2131 06/16/17 1247  GLUCAP 186* 99 131* 156* 187*   Lipid  Profile: No results for input(s): CHOL, HDL, LDLCALC, TRIG, CHOLHDL, LDLDIRECT in the last 72 hours. Thyroid Function Tests: No results for input(s): TSH, T4TOTAL, FREET4, T3FREE, THYROIDAB in the last 72 hours. Anemia Panel: No results for input(s): VITAMINB12, FOLATE, FERRITIN, TIBC, IRON, RETICCTPCT in the last 72 hours. Sepsis Labs: No results for input(s): PROCALCITON, LATICACIDVEN in the last 168 hours.  Recent Results (from the past 240 hour(s))  Aerobic/Anaerobic Culture (surgical/deep wound)     Status: None   Collection Time: 06/08/17 10:30 AM  Result Value Ref Range Status   Specimen Description ABSCESS VERTEBRA  Final   Special Requests NONE  Final   Gram Stain   Final    FEW WBC PRESENT, PREDOMINANTLY PMN NO ORGANISMS SEEN    Culture   Final    No growth aerobically or anaerobically. Performed at [facility], [address], [place], [place] [address]    Report Status 06/13/2017 FINAL  Final  Radiology Studies: No results found.      Scheduled Meds: . amLODipine  5 mg Oral Daily  . buprenorphine-naloxone  1 tablet Sublingual Daily  . gabapentin  300 mg Oral TID  . glipiZIDE  2.5 mg Oral QAC breakfast  . insulin aspart  0-15 Units Subcutaneous TID WC  . insulin aspart  0-5 Units Subcutaneous QHS  . lidocaine  1 patch Transdermal Q24H  . methocarbamol  500 mg Oral TID  . nicotine  21 mg Transdermal Daily  . pantoprazole  40 mg Oral Daily  . polyethylene glycol  17 g Oral BID  . QUEtiapine  50 mg Oral QHS   Continuous Infusions: . penicillin G 2.5 - 3.5 MILLION UNITS IVPB 3 Million Units (06/16/17 1524)     LOS: 12 days    Time spent: 35 minutes.     Alba Cory, MD [facility] Pager [phone]  If 7PM-7AM, please contact night-coverage [email or web address] Password [email or web address] 06/16/2017, 4:10 PM

## 2017-06-16 NOTE — Progress Notes (Signed)
[facility]  Date of Admission:  06/04/2017             ASSESSMENT/PLAN  [name] is a 42 y/o with previous history of substance abuse being treated for Streptococcus Viridans osteomyelitis of the lumbar spine. He is on Day 12 of antimicrobial treatment with Penicillin. He continues to have improving back pain. Working with primary team to wean Suboxone for SNF placement.  Streptococcus Viridans Vertebral Osteomyelitis - Continue Penicillin with goal treatment of 6 weeks. After an additional 2 weeks of penicillin may consider changing to Levofloxacin to complete the remain 2 weeks of treatment.   Opioid Use Disorder - Currently weaning off Suboxone. Treatment plan per Dr. Mikey BussingHoffman and primary team.     Principal Problem:   Acute osteomyelitis of lumbar spine (HCC) Active Problems:   Chronic hepatitis C virus genotype 1a infection (HCC)   Opioid use disorder, moderate, dependence (HCC)   GERD (gastroesophageal reflux disease)   Bipolar disorder (HCC)   Discitis   Osteomyelitis (HCC)   . amLODipine  5 mg Oral Daily  . buprenorphine-naloxone  1 tablet Sublingual Daily  . gabapentin  300 mg Oral TID  . glipiZIDE  2.5 mg Oral QAC breakfast  . insulin aspart  0-15 Units Subcutaneous TID WC  . insulin aspart  0-5 Units Subcutaneous QHS  . lidocaine  1 patch Transdermal Q24H  . methocarbamol  500 mg Oral TID  . nicotine  21 mg Transdermal Daily  . pantoprazole  40 mg Oral Daily  . polyethylene glycol  17 g Oral BID  . QUEtiapine  50 mg Oral QHS    SUBJECTIVE:  Afebrile overnight and back pain continues to improve on a daily basis. Working with primary team to possibly wean off Suboxone to go to SNF.   Allergies  Allergen Reactions  . Acetaminophen Other (See Comments)    Due to Ulcers/ Liver Damage  . Buprenorphine Hcl Itching  . Meperidine Rash  . Morphine And Related Itching     Review of Systems: Review of Systems  Constitutional:  Negative for chills and fever.  Respiratory: Negative for cough, shortness of breath and wheezing.   Cardiovascular: Negative for chest pain.  Gastrointestinal: Negative for abdominal pain, constipation, diarrhea, nausea and vomiting.  Genitourinary: Negative for dysuria, frequency and urgency.  Musculoskeletal: Positive for back pain.  Skin: Negative for rash.      OBJECTIVE: Vitals:   06/08/17 0849 06/15/17 0500 06/15/17 1100 06/15/17 2134  BP:  (!) 138/100 (!) 137/97 (!) 155/90  Pulse:  87 85 88  Resp:  14 18 18   Temp:  98.2 F (36.8 C) 98.6 F (37 C) 98.4 F (36.9 C)  TempSrc:  Oral Oral Oral  SpO2:  99% 100% 100%  Weight: 160 lb (72.6 kg)     Height: 5\' 3"  (1.6 m)      Body mass index is 28.34 kg/m.  Physical Exam  Constitutional: He is oriented to person, place, and time. He appears well-developed and well-nourished. No distress.  Cardiovascular: Normal rate, regular rhythm, normal heart sounds and intact distal pulses. Exam reveals no gallop and no friction rub.  No murmur heard. Pulmonary/Chest: Effort normal and breath sounds normal. No stridor. No respiratory distress. He has no wheezes. He has no rales. He exhibits no tenderness.  Abdominal: Soft. Bowel sounds are normal. He exhibits no distension.  Neurological: He is alert and oriented to person, place, and time.  Skin: Skin is warm  and dry.  Psychiatric: He has a normal mood and affect. His behavior is normal. Judgment and thought content normal.    Lab Results Lab Results  Component Value Date   WBC 5.6 06/16/2017   HGB 12.9 (L) 06/16/2017   HCT 40.5 06/16/2017   MCV 95.3 06/16/2017   PLT 189 06/16/2017    Lab Results  Component Value Date   CREATININE 0.89 06/16/2017   BUN 14 06/16/2017   NA 136 06/16/2017   K 4.5 06/16/2017   CL 103 06/16/2017   CO2 24 06/16/2017    Lab Results  Component Value Date   ALT 34 06/07/2017   AST 22 06/07/2017   ALKPHOS 52 06/07/2017   BILITOT 0.5 06/07/2017       Microbiology: Recent Results (from the past 240 hour(s))  Aerobic/Anaerobic Culture (surgical/deep wound)     Status: None   Collection Time: 06/08/17 10:30 AM  Result Value Ref Range Status   Specimen Description ABSCESS VERTEBRA  Final   Special Requests NONE  Final   Gram Stain   Final    FEW WBC PRESENT, PREDOMINANTLY PMN NO ORGANISMS SEEN    Culture   Final    No growth aerobically or anaerobically. Performed at [facility], [address], [place], [place] [address]    Report Status 06/13/2017 FINAL  Final     Marcos Eke, NP [facility] [phone] Pager  06/16/2017  12:36 PM

## 2017-06-16 NOTE — Progress Notes (Signed)
Pharmacy Antibiotic Note  Joshua Thomas is a 42 y.o. male admitted on 06/04/2017 with ?sepsis.  Patient was on PenG for viridans strep bacteremia, but with fevers and leukocytosis, antibiotics were switched to vancomycin and ceftriaxone.  Now with improvement and Pharmacy to transition patient back to PenG.  Renal function stable, afebrile, WBC WNL.   Plan: Continue PenG 3 million units IV Q4H.   Pharmacy will sign off as dosage adjustment is likely unnecessary.  Thank you for the consult!   Height: 5\' 3"  (160 cm) Weight: 160 lb (72.6 kg) IBW/kg (Calculated) : 56.9  Temp (24hrs), Avg:98.5 F (36.9 C), Min:98.4 F (36.9 C), Max:98.6 F (37 C)  Recent Labs  Lab 06/10/17 0407 06/11/17 0828 06/12/17 0344 06/13/17 0422 06/14/17 1428 06/15/17 0343 06/16/17 0308  WBC 6.9 10.5  --   --  5.6 5.2 5.6  CREATININE  --   --  1.07 1.07 0.92 0.86 0.89    Estimated Creatinine Clearance: 96.7 mL/min (by C-G formula based on SCr of 0.89 mg/dL).    Allergies  Allergen Reactions  . Acetaminophen Other (See Comments)    Due to Ulcers/ Liver Damage  . Buprenorphine Hcl Itching  . Meperidine Rash  . Morphine And Related Itching    PenG 5/25>> 6/1, restart 6/3 Vanc 6/1>> 6/3 CTX 6/1>> 6/3  5/7 BCx: 2/2 Viridans strep 5/24 urine: neg 5/24 BCx: neg 5/29 vertebra abscess - NGTD HIV negative    Hoke Baer D. Laney Potashang, PharmD, BCPS, BCCCP Pager:  [phone] 06/16/2017, 8:46 AM

## 2017-06-17 LAB — GLUCOSE, CAPILLARY
GLUCOSE-CAPILLARY: 149 mg/dL — AB (ref 65–99)
GLUCOSE-CAPILLARY: 182 mg/dL — AB (ref 65–99)
GLUCOSE-CAPILLARY: 156 mg/dL — AB (ref 65–99)
Glucose-Capillary: 198 mg/dL — ABNORMAL HIGH (ref 65–99)
Glucose-Capillary: 198 mg/dL — ABNORMAL HIGH (ref 65–99)

## 2017-06-17 NOTE — Progress Notes (Signed)
[facility]  Date of Admission:  06/04/2017            ASSESSMENT/PLAN  Joshua Thomas continues to be treated for Strep Viridans osteomyelitis of the lumbar spine. He is now on Day 13 of antimicrobial therapy with goal of 6 weeks. He is having increased pain today likely related to decreasing Suboxone and stopping ketorolac. Encouraged to continue to move around to prevent stiffness.   Streptococcus Viridans Vertebral Osteomyelitis - Continue penicillin. May consider staying in the hospital for one additional week and then transition to levofloxacin.   Opioid Use Disorder - Continue to wean off Suboxone. Change per Dr. Mikey Bussing   Dr. Ninetta Lights is available over the weekend as needed and Dr. Luciana Axe will see again on Monday.    Principal Problem:   Acute osteomyelitis of lumbar spine (HCC) Active Problems:   Chronic hepatitis C virus genotype 1a infection (HCC)   Opioid use disorder, moderate, dependence (HCC)   GERD (gastroesophageal reflux disease)   Bipolar disorder (HCC)   Discitis   Osteomyelitis (HCC)   . amLODipine  5 mg Oral Daily  . buprenorphine-naloxone  1 tablet Sublingual Daily  . gabapentin  300 mg Oral TID  . glipiZIDE  2.5 mg Oral QAC breakfast  . insulin aspart  0-15 Units Subcutaneous TID WC  . insulin aspart  0-5 Units Subcutaneous QHS  . lidocaine  1 patch Transdermal Q24H  . methocarbamol  500 mg Oral TID  . nicotine  21 mg Transdermal Daily  . pantoprazole  40 mg Oral Daily  . polyethylene glycol  17 g Oral BID  . QUEtiapine  50 mg Oral QHS    SUBJECTIVE:  Afebrile overnight. Having increasing levels of back pain with medication changes.  Allergies  Allergen Reactions  . Acetaminophen Other (See Comments)    Due to Ulcers/ Liver Damage  . Buprenorphine Hcl Itching  . Meperidine Rash  . Morphine And Related Itching     Review of Systems: Review of Systems  Constitutional: Negative for chills and fever.    Respiratory: Negative for cough, shortness of breath and wheezing.   Cardiovascular: Negative for chest pain and leg swelling.  Musculoskeletal: Positive for back pain.  Neurological: Negative for dizziness, weakness and headaches.      OBJECTIVE: Vitals:   06/15/17 2134 06/16/17 1500 06/16/17 2102 06/17/17 1300  BP: (!) 155/90 (!) 145/79 (!) 129/94   Pulse:  75 81 82  Resp: 18  18 18   Temp:  97.8 F (36.6 C) 98.6 F (37 C) 98.5 F (36.9 C)  TempSrc: Oral  Oral Oral  SpO2: 100%  97% 98%  Weight:      Height:       Body mass index is 28.34 kg/m.  Physical Exam  Constitutional: He is oriented to person, place, and time. He appears well-developed and well-nourished. No distress.  Cardiovascular: Normal rate, regular rhythm, normal heart sounds and intact distal pulses. Exam reveals no gallop and no friction rub.  No murmur heard. Pulmonary/Chest: Effort normal and breath sounds normal. No stridor. No respiratory distress. He has no wheezes. He has no rales. He exhibits no tenderness.  Neurological: He is alert and oriented to person, place, and time.  Skin: Skin is warm and dry.  Psychiatric: He has a normal mood and affect. His behavior is normal. Judgment and thought content normal.    Lab Results Lab Results  Component Value Date   WBC 5.6 06/16/2017  HGB 12.9 (L) 06/16/2017   HCT 40.5 06/16/2017   MCV 95.3 06/16/2017   PLT 189 06/16/2017    Lab Results  Component Value Date   CREATININE 0.89 06/16/2017   BUN 14 06/16/2017   NA 136 06/16/2017   K 4.5 06/16/2017   CL 103 06/16/2017   CO2 24 06/16/2017    Lab Results  Component Value Date   ALT 34 06/07/2017   AST 22 06/07/2017   ALKPHOS 52 06/07/2017   BILITOT 0.5 06/07/2017     Microbiology: Recent Results (from the past 240 hour(s))  Aerobic/Anaerobic Culture (surgical/deep wound)     Status: None   Collection Time: 06/08/17 10:30 AM  Result Value Ref Range Status   Specimen Description ABSCESS  VERTEBRA  Final   Special Requests NONE  Final   Gram Stain   Final    FEW WBC PRESENT, PREDOMINANTLY PMN NO ORGANISMS SEEN    Culture   Final    No growth aerobically or anaerobically. Performed at [facility], [address] [place], [place] [address]    Report Status 06/13/2017 FINAL  Final     Joshua EkeGreg Makinzi Prieur, NP [facility] [phone] Pager  06/17/2017  4:50 PM

## 2017-06-17 NOTE — Progress Notes (Signed)
Physical Therapy Treatment Patient Details Name: Joshua SleightLee W Jacko MRN: [id number] DOB: 03/22/1975 Today's Date: 06/17/2017    History of Present Illness Pt 42 y.o. male with medical history significant for bipolar disorder, chronic low back pain, tobacco abuse, hepatitis C, polysubstance abuse disorder with IV drug abuse, and GERD who was recently discharged [facility]/2017 after treatment for acute discitis/osteomyelitis in his lumbar spine. Pt now s/p I&D of L2/3 disc space on 5/30.    PT Comments    Patient progressing towards his goals with therapy. Increase in ambulation distance to 160 feet with RW and slightly improved balance noted this session, although still very guarded throughout gait and utilizes heavy BUE support on RW. Unfortunately, think patient has participated in little OOB activity when not with therapy secondary to fear of increased pain; continue to encourage activity and pain management techniques.    Follow Up Recommendations  SNF     Equipment Recommendations  Rolling walker with 5" wheels    Recommendations for Other Services       Precautions / Restrictions Precautions Precautions: Fall Restrictions Weight Bearing Restrictions: No    Mobility  Bed Mobility Overal bed mobility: Modified Independent                Transfers Overall transfer level: Needs assistance Equipment used: Rolling walker (2 wheeled) Transfers: Sit to/from Stand Sit to Stand: Min guard         General transfer comment: prefers to push up from RW with both hands with increased time and effort.   Ambulation/Gait Ambulation/Gait assistance: Min assist Ambulation Distance (Feet): 160 Feet Assistive device: Rolling walker (2 wheeled) Gait Pattern/deviations: Step-through pattern;Decreased dorsiflexion - left     General Gait Details: Patient very guarded with heavy reliance on RW. Decreased pelvic rotation. No left foot drag noted this session. Required one  standing rest break.   Stairs             Wheelchair Mobility    Modified Rankin (Stroke Patients Only)       Balance Overall balance assessment: Needs assistance Sitting-balance support: Feet supported;Bilateral upper extremity supported Sitting balance-Leahy Scale: Good     Standing balance support: Bilateral upper extremity supported Standing balance-Leahy Scale: Poor Standing balance comment: RW for support throughout                            Cognition Arousal/Alertness: Awake/alert Behavior During Therapy: Impulsive Overall Cognitive Status: No family/caregiver present to determine baseline cognitive functioning Area of Impairment: Attention;Following commands;Safety/judgement;Problem solving                   Current Attention Level: Selective   Following Commands: Follows one step commands with increased time Safety/Judgement: Decreased awareness of safety;Decreased awareness of deficits   Problem Solving: Decreased initiation;Difficulty sequencing;Requires verbal cues General Comments: Decreased impulsivity noted this session and benefits from direct, simple commands      Exercises      General Comments        Pertinent Vitals/Pain Pain Assessment: Faces Faces Pain Scale: Hurts little more Pain Location: back Pain Descriptors / Indicators: Aching;Discomfort Pain Intervention(s): Limited activity within patient's tolerance;Monitored during session    Home Living                      Prior Function            PT Goals (current goals can now  be found in the care plan section) Acute Rehab PT Goals Patient Stated Goal: decrease pain Potential to Achieve Goals: Fair Progress towards PT goals: Progressing toward goals    Frequency    Min 2X/week      PT Plan Current plan remains appropriate    Co-evaluation              AM-PAC PT "6 Clicks" Daily Activity  Outcome Measure  Difficulty turning over  in bed (including adjusting bedclothes, sheets and blankets)?: A Lot Difficulty moving from lying on back to sitting on the side of the bed? : A Lot Difficulty sitting down on and standing up from a chair with arms (e.g., wheelchair, bedside commode, etc,.)?: A Little Help needed moving to and from a bed to chair (including a wheelchair)?: A Little Help needed walking in hospital room?: A Little Help needed climbing 3-5 steps with a railing? : Total 6 Click Score: 14    End of Session Equipment Utilized During Treatment: Gait belt Activity Tolerance: Patient tolerated treatment well Patient left: in bed;with call bell/phone within reach Nurse Communication: Mobility status PT Visit Diagnosis: Pain;Other abnormalities of gait and mobility (R26.89) Pain - part of body: (back)     Time: 1191-4782 PT Time Calculation (min) (ACUTE ONLY): 21 min  Charges:  $Gait Training: 8-22 mins                    G Codes:       Laurina Bustle, PT, DPT Acute Rehabilitation Services  Pager: [phone]    Vanetta Mulders 06/17/2017, 12:53 PM

## 2017-06-17 NOTE — Progress Notes (Signed)
PROGRESS NOTE    Joshua Thomas  ZOX:[id number] DOB: Sep 07, 1975 DOA: 06/04/2017 PCP: Patient, No Pcp Per    Brief Narrative: Brief Narrative:42 y.o.malewith medical history significant forbipolar disorder, chronic low back pain, tobacco abuse, hepatitis C, polysubstance abuse disorder with IV drug abuse, and GERD [facility] treatment for acute discitis/osteomyelitis in his lumbar spine along with strep viridans bacteremia with plans to continue amoxicillin for a total of 6 weeks. He was also recently started on Suboxone a few days ago due to opioid use disorder. Hepresented again with acute flare of his chronic low back pain that has been ongoing for the last week. There was no signs of cauda equina with any bowel or bladder incontinence or saddle anesthesia. He states that he has been compliant with his home antibiotics and has recently used IV cocaine. He underwent an MRI of his low back earlier todaydemonstrating severe progressive discitis/osteomyelitis at L2/3 with epidural phlegmonous changes that appeared drainable with increase in size from prior exam. ED physician at that time consulted with neurosurgery Dr. Jordan Likes at [facility] who reviewed his MRI images with no acute surgical issue noted at thistime. Discussion was also had with ID physician Dr. Alphonsa Gin recommended withholding antibiotics at this time with transfer to [facility] for IR consult and aspiration. It appears that patient's pain was not well controlled and became upset and left AMA. He has now returned with unrelenting pain and is agreeable for transfer. He denies any fevers, chills, focal motor weakness, numbness, or tingling in his extremities.   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  Awaiting [facility] facility.  He is willing to be off suboxone  for time be at SNF/ will contact Dr [name]  On IV penicillin.  Continue with  ibuprofen for pain prn. He relates Toradol helps with pain   HTN;  Continue with  Norvasc  Headache; Fioricet PRN.   Hyponatremia. Follow trend.   Depression;  On Cymbalta.   Opiate Dependence:Stable, continue Suboxone follow outpatient Discussed with Dr Lou Miner, we could do short taper, suboxone 8 mg one tablet daily for 4- days then 4 mg tablet daily for couples days then stop. Monitor for craving and withdrawal symptoms. If he develops significant withdrawal symptoms we could put him back on suboxone.  Tolerating taper. Report dreaming. Day 3 daily dose of Suboxone 8 mg.   Hepatitis C:outpatient follow up, no history of treatment  HIV screening -screening was negative  DM; started on glipizide.      DVT prophylaxis: scd Code Status: full code.  Family Communication: care discussed with patient.  Disposition Plan: remain inpatient for IV antibiotics.  Consultants:   ID.    Procedures:  Procedures: Status post aspiration of the lesion L2 and L3 spine 06/08/2017.  Antimicrobials:   Penicillin 6-03     Subjective: He is feeling ok, still with pain.  Report " dope dreaming "   Objective: Vitals:   06/15/17 2134 06/16/17 1500 06/16/17 2102 06/17/17 1300  BP: (!) 155/90 (!) 145/79 (!) 129/94   Pulse:  75  81 82  Resp: 18  18 18   Temp:  97.8 F (36.6 C) 98.6 F (37 C) 98.5 F (36.9 C)  TempSrc: Oral  Oral Oral  SpO2: 100%  97% 98%  Weight:      Height:        Intake/Output Summary (Last 24 hours) at 06/17/2017 1652 Last data filed at 06/17/2017  0700 Gross per 24 hour  Intake 480 ml  Output 1200 ml  Net -720 ml   Filed Weights   06/04/17 0219 06/08/17 0849  Weight: 72.6 kg (160 lb) 72.6 kg (160 lb)    Examination:  General exam: NAD Respiratory system: CTA Cardiovascular system: S 1, S 2 RRR Gastrointestinal system: BS present, soft, nt Central nervous system: non focal.  Extremities: Symmetric power.  Skin: No rashes.     Data Reviewed: I have personally reviewed following labs and imaging studies  CBC: Recent Labs  Lab 06/11/17 0828 06/14/17 1428 06/15/17 0343 06/16/17 0308  WBC 10.5 5.6 5.2 5.6  NEUTROABS  --  2.5  --   --   HGB 14.8 12.7* 12.6* 12.9*  HCT 46.6 40.4 39.4 40.5  MCV 94.3 96.0 94.3 95.3  PLT 304 194 180 189   Basic Metabolic Panel: Recent Labs  Lab 06/12/17 0344 06/13/17 0422 06/14/17 1428 06/15/17 0343 06/16/17 0308  NA 137 136 139 136 136  K 4.7 3.8 3.8 4.5 4.5  CL 98* 103 99* 105 103  CO2 31 28 31 26 24   GLUCOSE 148* 141* 142* 187* 206*  BUN 23* 25* 13 15 14   CREATININE 1.07 1.07 0.92 0.86 0.89  CALCIUM 10.2 9.1 9.6 9.1 9.5   GFR: Estimated Creatinine Clearance: 96.7 mL/min (by C-G formula based on SCr of 0.89 mg/dL). Liver Function Tests: No results for input(s): AST, ALT, ALKPHOS, BILITOT, PROT, ALBUMIN in the last 168 hours. No results for input(s): LIPASE, AMYLASE in the last 168 hours. No results for input(s): AMMONIA in the last 168 hours. Coagulation Profile: No results for input(s): INR, PROTIME in the last 168 hours. Cardiac Enzymes: No results for input(s): CKTOTAL, CKMB, CKMBINDEX, TROPONINI in the last 168 hours. BNP (last 3 results) No results for input(s): PROBNP in the last 8760 hours. HbA1C: No results for input(s): HGBA1C in the last 72 hours. CBG: Recent Labs  Lab 06/16/17 1648 06/16/17 2106 06/17/17 0641 06/17/17 1320 06/17/17 1639  GLUCAP 149* 183* 182* 198* 156*   Lipid Profile: No results for input(s): CHOL, HDL, LDLCALC, TRIG, CHOLHDL,  LDLDIRECT in the last 72 hours. Thyroid Function Tests: No results for input(s): TSH, T4TOTAL, FREET4, T3FREE, THYROIDAB in the last 72 hours. Anemia Panel: No results for input(s): VITAMINB12, FOLATE, FERRITIN, TIBC, IRON, RETICCTPCT in the last 72 hours. Sepsis Labs: No results for input(s): PROCALCITON, LATICACIDVEN in the last 168 hours.  Recent Results (from the past 240 hour(s))  Aerobic/Anaerobic Culture (surgical/deep wound)     Status: None   Collection Time: 06/08/17 10:30 AM  Result Value Ref Range Status   Specimen Description ABSCESS VERTEBRA  Final   Special Requests NONE  Final   Gram Stain   Final    FEW WBC PRESENT, PREDOMINANTLY PMN NO ORGANISMS SEEN    Culture   Final    No growth aerobically or anaerobically. Performed at [facility], [address] [place], [place] [address]    Report Status 06/13/2017 FINAL  Final         Radiology Studies: No results found.  Scheduled Meds: . amLODipine  5 mg Oral Daily  . buprenorphine-naloxone  1 tablet Sublingual Daily  . gabapentin  300 mg Oral TID  . glipiZIDE  2.5 mg Oral QAC breakfast  . insulin aspart  0-15 Units Subcutaneous TID WC  . insulin aspart  0-5 Units Subcutaneous QHS  . lidocaine  1 patch Transdermal Q24H  . methocarbamol  500 mg Oral TID  . nicotine  21 mg Transdermal Daily  . pantoprazole  40 mg Oral Daily  . polyethylene glycol  17 g Oral BID  . QUEtiapine  50 mg Oral QHS   Continuous Infusions: . penicillin G 2.5 - 3.5 MILLION UNITS IVPB 3 Million Units (06/17/17 1626)     LOS: 13 days    Time spent: 35 minutes.     Alba Cory, MD [facility] Pager [phone]  If 7PM-7AM, please contact night-coverage [email or web address] Password [facility] 06/17/2017, 4:52 PM

## 2017-06-18 LAB — GLUCOSE, CAPILLARY
Glucose-Capillary: 207 mg/dL — ABNORMAL HIGH (ref 65–99)
Glucose-Capillary: 150 mg/dL — ABNORMAL HIGH (ref 65–99)
Glucose-Capillary: 177 mg/dL — ABNORMAL HIGH (ref 65–99)
Glucose-Capillary: 151 mg/dL — ABNORMAL HIGH (ref 65–99)

## 2017-06-18 LAB — BASIC METABOLIC PANEL WITH GFR
Anion gap: 8 (ref 5–15)
BUN: 15 mg/dL (ref 6–20)
CO2: 27 mmol/L (ref 22–32)
Calcium: 9.4 mg/dL (ref 8.9–10.3)
Chloride: 101 mmol/L (ref 101–111)
Creatinine, Ser: 0.81 mg/dL (ref 0.61–1.24)
GFR calc Af Amer: >60 mL/min
GFR calc non Af Amer: >60 mL/min
Glucose, Bld: 218 mg/dL — ABNORMAL HIGH (ref 65–99)
Potassium: 4.1 mmol/L (ref 3.5–5.1)
Sodium: 136 mmol/L (ref 135–145)

## 2017-06-18 NOTE — Plan of Care (Signed)
  Problem: Nutrition: Goal: Adequate nutrition will be maintained 06/18/2017 1442 by Darrow BussingArcilla, Shun Pletz M, RN Outcome: Progressing 06/18/2017 1442 by Darrow BussingArcilla, Iasiah Ozment M, RN Reactivated   Problem: Pain Managment: Goal: General experience of comfort will improve 06/18/2017 1442 by Darrow BussingArcilla, Dominiq Fontaine M, RN Outcome: Progressing 06/18/2017 1442 by Darrow BussingArcilla, Thompson Mckim M, RN Reactivated   Problem: Safety: Goal: Ability to remain free from injury will improve 06/18/2017 1442 by Darrow BussingArcilla, Brack Shaddock M, RN Outcome: Progressing 06/18/2017 1442 by Darrow BussingArcilla, Dallie Patton M, RN Reactivated   Problem: Skin Integrity: Goal: Risk for impaired skin integrity will decrease Outcome: Progressing

## 2017-06-18 NOTE — Progress Notes (Signed)
PROGRESS NOTE    Joshua Thomas  ZOX:[id number] DOB: 1975/07/13 DOA: 06/04/2017 PCP: Patient, No Pcp Per    Brief Narrative: Brief Narrative:42 y.o.malewith medical history significant forbipolar disorder, chronic low back pain, tobacco abuse, hepatitis C, polysubstance abuse disorder with IV drug abuse, and GERD whowas recently [facility] after treatment for acute discitis/osteomyelitis in his lumbar spine along with strep viridans bacteremia with plans to continue amoxicillin for a total of 6 weeks. He was also recently started on Suboxone a few days ago due to opioid use disorder. Hepresented again with acute flare of his chronic low back pain that has been ongoing for the last week. There was no signs of cauda equina with any bowel or bladder incontinence or saddle anesthesia. He states that he has been compliant with his home antibiotics and has recently used IV cocaine. He underwent an MRI of his low back earlier todaydemonstrating severe progressive discitis/osteomyelitis at L2/3 with epidural phlegmonous changes that appeared drainable with increase in size from prior exam. ED physician at that time consulted with neurosurgery Dr. Jordan Thomas at [facility] who reviewed his MRI images with no acute surgical issue noted at thistime. Discussion was also had with ID physician Dr. Alphonsa Thomas recommended withholding antibiotics at this time with transfer to [facility] for IR consult and aspiration. It appears that patient's pain was not well controlled and became upset and left AMA. He has now returned with unrelenting pain and is agreeable for transfer. He denies any fevers, chills, focal motor weakness, numbness, or tingling in his extremities.   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 Day 14 treatment.  [facility].  He is willing to be off suboxone  for time be at SNF/ will contact Dr Joshua Thomas/  On IV penicillin.  Continue with  ibuprofen for pain prn. He relates Toradol helps with pain   HTN;  Continue with  Norvasc  Headache; Fioricet PRN.   Hyponatremia. Follow trend.   Depression;  On Cymbalta.   Opiate Dependence:Stable, continue Suboxone follow outpatient Discussed with Dr Joshua Thomas, we could do short taper, suboxone 8 mg one tablet daily for 4- days then 4 mg tablet daily for couples days then stop. Monitor for craving and withdrawal symptoms. If he develops significant withdrawal symptoms we could put him back on suboxone.  Tolerating taper. Report dreaming. Day 4 daily dose of Suboxone 8 mg. Continue.   Hepatitis C:outpatient follow up, no history of treatment  HIV screening -screening was negative  DM; started on glipizide.      DVT prophylaxis: scd Code Status: full code.  Family Communication: care discussed with patient.  Disposition Plan: remain inpatient for IV antibiotics.  Consultants:   ID.    Procedures:  Procedures: Status post aspiration of the lesion L2 and L3 spine 06/08/2017.  Antimicrobials:   Penicillin 6-03     Subjective: He is feeling ok, he was told by ID dc he only need 3 weeks IV antibiotics. I will speak with DR Joshua Thomas on Monday   Objective: Vitals:   06/17/17 1300 06/17/17 2129 06/18/17  0420 06/18/17 1100  BP:  112/79 129/72 137/82  Pulse: 82 74 90 72  Resp: 18   18  Temp: 98.5 F (36.9 C) 97.9 F (36.6 C) 98.7 F (37.1 C) 97.8 F (36.6 C)  TempSrc: Oral Oral Oral Oral  SpO2: 98% 96% 96% 97%  Weight:      Height:         Intake/Output Summary (Last 24 hours) at 06/18/2017 1603 Last data filed at 06/18/2017 1500 Gross per 24 hour  Intake 720 ml  Output 3290 ml  Net -2570 ml   Filed Weights   06/04/17 0219 06/08/17 0849  Weight: 72.6 kg (160 lb) 72.6 kg (160 lb)    Examination:  General exam: NAD Respiratory system: CTA Cardiovascular system: S ,1, S 2 ,RRR Gastrointestinal system: BS present, soft, nt Central nervous system: Non focal Extremities: symmetric power.  Skin: No rashes.     Data Reviewed: I have personally reviewed following labs and imaging studies  CBC: Recent Labs  Lab 06/14/17 1428 06/15/17 0343 06/16/17 0308  WBC 5.6 5.2 5.6  NEUTROABS 2.5  --   --   HGB 12.7* 12.6* 12.9*  HCT 40.4 39.4 40.5  MCV 96.0 94.3 95.3  PLT 194 180 189   Basic Metabolic Panel: Recent Labs  Lab 06/13/17 0422 06/14/17 1428 06/15/17 0343 06/16/17 0308 06/18/17 0333  NA 136 139 136 136 136  K 3.8 3.8 4.5 4.5 4.1  CL 103 99* 105 103 101  CO2 28 31 26 24 27   GLUCOSE 141* 142* 187* 206* 218*  BUN 25* 13 15 14 15   CREATININE 1.07 0.92 0.86 0.89 0.81  CALCIUM 9.1 9.6 9.1 9.5 9.4   GFR: Estimated Creatinine Clearance: 106.2 mL/min (by C-G formula based on SCr of 0.81 mg/dL). Liver Function Tests: No results for input(s): AST, ALT, ALKPHOS, BILITOT, PROT, ALBUMIN in the last 168 hours. No results for input(s): LIPASE, AMYLASE in the last 168 hours. No results for input(s): AMMONIA in the last 168 hours. Coagulation Profile: No results for input(s): INR, PROTIME in the last 168 hours. Cardiac Enzymes: No results for input(s): CKTOTAL, CKMB, CKMBINDEX, TROPONINI in the last 168 hours. BNP (last 3 results) No results for input(s): PROBNP in the last 8760 hours. HbA1C: No results for input(s): HGBA1C in the last 72 hours. CBG: Recent Labs  Lab 06/17/17 1320 06/17/17 1639 06/17/17 2132 06/18/17 0634 06/18/17 1233  GLUCAP 198* 156* 198* 207* 150*   Lipid Profile: No results  for input(s): CHOL, HDL, LDLCALC, TRIG, CHOLHDL, LDLDIRECT in the last 72 hours. Thyroid Function Tests: No results for input(s): TSH, T4TOTAL, FREET4, T3FREE, THYROIDAB in the last 72 hours. Anemia Panel: No results for input(s): VITAMINB12, FOLATE, FERRITIN, TIBC, IRON, RETICCTPCT in the last 72 hours. Sepsis Labs: No results for input(s): PROCALCITON, LATICACIDVEN in the last 168 hours.  No results found for this or any previous visit (from the past 240 hour(s)).       Radiology Studies: No results found.      Scheduled Meds: . amLODipine  5 mg Oral Daily  . buprenorphine-naloxone  1 tablet Sublingual Daily  . gabapentin  300 mg Oral TID  . glipiZIDE  2.5 mg Oral QAC breakfast  . insulin aspart  0-15 Units Subcutaneous TID WC  . insulin aspart  0-5 Units Subcutaneous QHS  . lidocaine  1 patch Transdermal Q24H  . methocarbamol  500 mg Oral TID  . nicotine  21 mg Transdermal Daily  . pantoprazole  40  mg Oral Daily  . polyethylene glycol  17 g Oral BID  . QUEtiapine  50 mg Oral QHS   Continuous Infusions: . penicillin G 2.5 - 3.5 MILLION UNITS IVPB Stopped (06/18/17 1203)     LOS: 14 days    Time spent: 35 minutes.     Alba CoryBelkys A Regalado, MD [facility] Pager [phone]  If 7PM-7AM, please contact night-coverage [email or web address] Password [email or web address] 06/18/2017, 4:03 PM

## 2017-06-19 LAB — GLUCOSE, CAPILLARY
GLUCOSE-CAPILLARY: 147 mg/dL — AB (ref 65–99)
GLUCOSE-CAPILLARY: 130 mg/dL — AB (ref 65–99)
Glucose-Capillary: 197 mg/dL — ABNORMAL HIGH (ref 65–99)
Glucose-Capillary: 113 mg/dL — ABNORMAL HIGH (ref 65–99)

## 2017-06-19 NOTE — Plan of Care (Signed)

## 2017-06-19 NOTE — Progress Notes (Signed)
PROGRESS NOTE    Joshua Thomas  RUE:[id number] DOB: Oct 10, 1975 DOA: 06/04/2017 PCP: Patient, No Pcp Per    Brief Narrative: Brief Narrative:42 y.o.malewith medical history significant forbipolar disorder, chronic low back pain, tobacco abuse, hepatitis C, polysubstance abuse disorder with IV drug abuse, and GERD whowas recently [facility] after treatment for acute discitis/osteomyelitis in his lumbar spine along with strep viridans bacteremia with plans to continue amoxicillin for a total of 6 weeks. He was also recently started on Suboxone a few days ago due to opioid use disorder. Hepresented again with acute flare of his chronic low back pain that has been ongoing for the last week. There was no signs of cauda equina with any bowel or bladder incontinence or saddle anesthesia. He states that he has been compliant with his home antibiotics and has recently used IV cocaine. He underwent an MRI of his low back earlier todaydemonstrating severe progressive discitis/osteomyelitis at L2/3 with epidural phlegmonous changes that appeared drainable with increase in size from prior exam. ED physician at that time consulted with neurosurgery Dr. Jordan Likes at [facility] who reviewed his MRI images with no acute surgical issue noted at thistime. Discussion was also had with ID physician Dr. Alphonsa Gin recommended withholding antibiotics at this time with transfer to [facility] for IR consult and aspiration. It appears that patient's pain was not well controlled and became upset and left AMA. He has now returned with unrelenting pain and is agreeable for transfer. He denies any fevers, chills, focal motor weakness, numbness, or tingling in his extremities.   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 Day 15 treatment.  [facility].  He is willing to be off suboxone  for time be at SNF/ will contact Dr [name]  On IV penicillin.  Continue with  ibuprofen for pain prn. He relates Toradol helps with pain   HTN;  Continue with  Norvasc  Headache; Fioricet PRN.   Hyponatremia. Follow trend.   Depression;  On Cymbalta.   Opiate Dependence:Stable, continue Suboxone follow outpatient Discussed with Dr Lou Miner, we could do short taper, suboxone 8 mg one tablet daily for 4- days then 4 mg tablet daily for couples days then stop. Monitor for craving and withdrawal symptoms. If he develops significant withdrawal symptoms we could put him back on suboxone.  Tolerating taper.  Day 5 daily dose of Suboxone 8 mg. Continue.   Hepatitis C:outpatient follow up, no history of treatment  HIV screening -screening was negative  DM; started on glipizide.      DVT prophylaxis: scd Code Status: full code.  Family Communication: care discussed with patient.  Disposition Plan: remain inpatient for IV antibiotics.  Consultants:   ID.    Procedures:  Procedures: Status post aspiration of the lesion L2 and L3 spine 06/08/2017.  Antimicrobials:   Penicillin 6-03     Subjective: He has been able to walk better, and move better   Objective: Vitals:   06/18/17 1100 06/18/17 1917 06/19/17 0354 06/19/17 1500  BP: 137/82 106/77 123/81 (!) 146/85  Pulse: 72 93 92 87  Resp: 18  18 18   Temp: 97.8 F (36.6 C) 98.7 F (37.1 C) 99.1 F (37.3 C)   TempSrc: Oral Oral Oral   SpO2: 97% 94% 98% 98%  Weight:      Height:        Intake/Output Summary (Last 24 hours) at 06/19/2017 1719 Last data filed at 06/19/2017  1500 Gross per 24 hour  Intake 840 ml  Output 1880 ml  Net -1040 ml   Filed Weights   06/04/17 0219 06/08/17 0849  Weight: 72.6 kg (160 lb) 72.6 kg (160 lb)    Examination:  General exam: NAD Respiratory system: CTA Cardiovascular system: S 1, S 2 RRR Gastrointestinal system: BS present, soft, nt Central nervous system: non focal.  Extremities: Symmetric power.  Skin: No rashes.     Data Reviewed: I have personally reviewed following labs and imaging studies  CBC: Recent Labs  Lab 06/14/17 1428 06/15/17 0343 06/16/17 0308  WBC 5.6 5.2 5.6  NEUTROABS 2.5  --   --   HGB 12.7* 12.6* 12.9*  HCT 40.4 39.4 40.5  MCV 96.0 94.3 95.3  PLT 194 180 189   Basic Metabolic Panel: Recent Labs  Lab 06/13/17 0422 06/14/17 1428 06/15/17 0343 06/16/17 0308 06/18/17 0333  NA 136 139 136 136 136  K 3.8 3.8 4.5 4.5 4.1  CL 103 99* 105 103 101  CO2 28 31 26 24 27   GLUCOSE 141* 142* 187* 206* 218*  BUN 25* 13 15 14 15   CREATININE 1.07 0.92 0.86 0.89 0.81  CALCIUM 9.1 9.6 9.1 9.5 9.4   GFR: Estimated Creatinine Clearance: 106.2 mL/min (by C-G formula based on SCr of 0.81 mg/dL). Liver Function Tests: No results for input(s): AST, ALT, ALKPHOS, BILITOT, PROT, ALBUMIN in the last 168 hours. No results for input(s): LIPASE, AMYLASE in the last 168 hours. No results for input(s): AMMONIA in the last 168 hours. Coagulation Profile: No results for input(s): INR, PROTIME in the last 168 hours. Cardiac Enzymes: No results for input(s): CKTOTAL, CKMB, CKMBINDEX, TROPONINI in the last 168 hours. BNP (last 3 results) No results for input(s): PROBNP in the last 8760 hours. HbA1C: No results for input(s): HGBA1C in the last 72 hours. CBG: Recent Labs  Lab 06/18/17 1657 06/18/17 2051 06/19/17 0627 06/19/17 1122 06/19/17 1715  GLUCAP 177* 151* 197* 113* 147*   Lipid Profile: No results for input(s): CHOL, HDL, LDLCALC, TRIG, CHOLHDL, LDLDIRECT in the last 72 hours. Thyroid  Function Tests: No results for input(s): TSH, T4TOTAL, FREET4, T3FREE, THYROIDAB in the last 72 hours. Anemia Panel: No results for input(s): VITAMINB12, FOLATE, FERRITIN, TIBC, IRON, RETICCTPCT in the last 72 hours. Sepsis Labs: No results for input(s): PROCALCITON, LATICACIDVEN in the last 168 hours.  No results found for this or any previous visit (from the past 240 hour(s)).       Radiology Studies: No results found.      Scheduled Meds: . amLODipine  5 mg Oral Daily  . buprenorphine-naloxone  1 tablet Sublingual Daily  . gabapentin  300 mg Oral TID  . glipiZIDE  2.5 mg Oral QAC breakfast  . insulin aspart  0-15 Units Subcutaneous TID WC  . insulin aspart  0-5 Units Subcutaneous QHS  . lidocaine  1 patch Transdermal Q24H  . methocarbamol  500 mg Oral TID  . nicotine  21 mg Transdermal Daily  . pantoprazole  40 mg Oral Daily  . polyethylene glycol  17 g Oral BID  . QUEtiapine  50 mg  Oral QHS   Continuous Infusions: . penicillin G 2.5 - 3.5 MILLION UNITS IVPB Stopped (06/19/17 1547)     LOS: 15 days    Time spent: 35 minutes.     Alba CoryBelkys A Kemi Gell, MD [facility] Pager [phone]  If 7PM-7AM, please contact night-coverage [email or web address] Password [email or web address] 06/19/2017, 5:19 PM

## 2017-06-19 NOTE — Progress Notes (Signed)
Orthopedic Tech Progress Note Patient Details:  Joshua Thomas 09/16/1975 [id number]  Ortho Devices Ortho Device/Splint Location: applied ohf to bed Ortho Device/Splint Interventions: Ordered, Application   Post Interventions Patient Tolerated: Well Instructions Provided: Care of device   Jennye MoccasinHughes, Lawerance Matsuo Craig 06/19/2017, 4:13 PM

## 2017-06-20 LAB — SEDIMENTATION RATE: Sed Rate: 44 mm/hr — ABNORMAL HIGH (ref 0–16)

## 2017-06-20 LAB — GLUCOSE, CAPILLARY
GLUCOSE-CAPILLARY: 162 mg/dL — AB (ref 65–99)
GLUCOSE-CAPILLARY: 173 mg/dL — AB (ref 65–99)
Glucose-Capillary: 250 mg/dL — ABNORMAL HIGH (ref 65–99)
Glucose-Capillary: 153 mg/dL — ABNORMAL HIGH (ref 65–99)

## 2017-06-20 LAB — C-REACTIVE PROTEIN: CRP: 1.1 mg/dL — AB (ref <1.0)

## 2017-06-20 NOTE — Progress Notes (Signed)
PROGRESS NOTE    Joshua Thomas  GYJ:[id number] DOB: 01-22-1975 DOA: 06/04/2017 PCP: Patient, No Pcp Per    Brief Narrative: Brief Narrative:42 y.o.malewith medical history significant forbipolar disorder, chronic low back pain, tobacco abuse, hepatitis C, polysubstance abuse disorder with IV drug abuse, and GERD whowas [facility] after treatment for acute discitis/osteomyelitis in his lumbar spine along with strep viridans bacteremia with plans to continue amoxicillin for a total of 6 weeks. He was also recently started on Suboxone a few days ago due to opioid use disorder. Hepresented again with acute flare of his chronic low back pain that has been ongoing for the last week. There was no signs of cauda equina with any bowel or bladder incontinence or saddle anesthesia. He states that he has been compliant with his home antibiotics and has recently used IV cocaine. He underwent an MRI of his low back earlier todaydemonstrating severe progressive discitis/osteomyelitis at L2/3 with epidural phlegmonous changes that appeared drainable with increase in size from prior exam. ED physician at that time consulted with neurosurgery Dr. Annette Stable at [facility] who reviewed his MRI images with no acute surgical issue noted at thistime. Discussion was also had with ID physician Dr. Jamelle Haring recommended withholding antibiotics at this time with transfer to [facility] for IR consult and aspiration. It appears that patient's pain was not well controlled and became upset and left AMA. He has now returned with unrelenting pain and is agreeable for transfer. He denies any fevers, chills, focal motor weakness, numbness, or tingling in his extremities.   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 Day 15 treatment.  [facility].  He is willing to be off suboxone  for time be at SNF/ will contact Dr Heber [place] [name]  On IV penicillin.  Continue with  ibuprofen for pain prn. He relates Toradol helps with pain  Options given to patient. Will repeat ESR, CRP. He could be discharge on oral antibiotics or he could complete 4 more weeks of IV antibiotics at SNF.   HTN;  Continue with  Norvasc  Headache; Fioricet PRN.   Hyponatremia. Follow trend.   Depression;  On Cymbalta.   Opiate Dependence:Stable, continue Suboxone follow outpatient Discussed with Dr Burney Gauze, we could do short taper, suboxone 8 mg one tablet daily for 4- days then 4 mg tablet daily for couples days then stop. Monitor for craving and withdrawal symptoms. If he develops significant withdrawal symptoms we could put him back on suboxone.  Tolerating taper.  Day 6 daily dose of Suboxone 8 mg. Continue.   Hepatitis C:outpatient follow up, no history of treatment  HIV screening -screening was negative  DM; started on glipizide.      DVT prophylaxis: scd Code Status: full code.  Family Communication: care discussed with patient.  Disposition Plan: remain inpatient for IV antibiotics.  Consultants:   ID.    Procedures:  Procedures: Status post aspiration of the lesion L2 and L3 spine 06/08/2017.  Antimicrobials:   Penicillin 6-03     Subjective: He is doing ok, still with back pain  Objective: Vitals:   06/19/17 0354 06/19/17 1500 06/19/17 2007 06/20/17 0441  BP: 123/81 (!) 146/85 120/76 125/74  Pulse: 92 87 85 92  Resp: 18 18    Temp: 99.1 F (37.3 C)  98.2 F (36.8 C) 98.3 F (36.8 C)  TempSrc: Oral  Oral Oral  SpO2:  98% 98% 97% 96%  Weight:      Height:        Intake/Output Summary (Last 24 hours) at 06/20/2017 1605 Last data filed at 06/20/2017 0855 Gross per 24 hour  Intake 480 ml  Output 2000 ml  Net -1520 ml   Filed Weights   06/04/17 0219 06/08/17 0849  Weight: 72.6 kg (160 lb) 72.6 kg (160 lb)    Examination:  General exam: NAD Respiratory system: CTA Cardiovascular system: S 1, S  2 RRR Gastrointestinal system: BS present, soft, nt Central nervous system: Non focal.  Extremities: Symmetric power.  Skin: No rashes.     Data Reviewed: I have personally reviewed following labs and imaging studies  CBC: Recent Labs  Lab 06/14/17 1428 06/15/17 0343 06/16/17 0308  WBC 5.6 5.2 5.6  NEUTROABS 2.5  --   --   HGB 12.7* 12.6* 12.9*  HCT 40.4 39.4 40.5  MCV 96.0 94.3 95.3  PLT 194 180 301   Basic Metabolic Panel: Recent Labs  Lab 06/14/17 1428 06/15/17 0343 06/16/17 0308 06/18/17 0333  NA 139 136 136 136  K 3.8 4.5 4.5 4.1  CL 99* 105 103 101  CO2 31 26 24 27   GLUCOSE 142* 187* 206* 218*  BUN 13 15 14 15   CREATININE 0.92 0.86 0.89 0.81  CALCIUM 9.6 9.1 9.5 9.4   GFR: Estimated Creatinine Clearance: 106.2 mL/min (by C-G formula based on SCr of 0.81 mg/dL). Liver Function Tests: No results for input(s): AST, ALT, ALKPHOS, BILITOT, PROT, ALBUMIN in the last 168 hours. No results for input(s): LIPASE, AMYLASE in the last 168 hours. No results for input(s): AMMONIA in the last 168 hours. Coagulation Profile: No results for input(s): INR, PROTIME in the last 168 hours. Cardiac Enzymes: No results for input(s): CKTOTAL, CKMB, CKMBINDEX, TROPONINI in the last 168 hours. BNP (last 3 results) No results for input(s): PROBNP in the last 8760 hours. HbA1C: No results for input(s): HGBA1C in the last 72 hours. CBG: Recent Labs  Lab 06/19/17 1122 06/19/17 1715 06/19/17 2004 06/20/17 0612 06/20/17 1207  GLUCAP 113* 147* 130* 250* 153*   Lipid Profile: No results for  input(s): CHOL, HDL, LDLCALC, TRIG, CHOLHDL, LDLDIRECT in the last 72 hours. Thyroid Function Tests: No results for input(s): TSH, T4TOTAL, FREET4, T3FREE, THYROIDAB in the last 72 hours. Anemia Panel: No results for input(s): VITAMINB12, FOLATE, FERRITIN, TIBC, IRON, RETICCTPCT in the last 72 hours. Sepsis Labs: No results for input(s): PROCALCITON, LATICACIDVEN in the last 168 hours.  No results found for this or any previous visit (from the past 240 hour(s)).       Radiology Studies: No results found.      Scheduled Meds: . amLODipine  5 mg Oral Daily  . buprenorphine-naloxone  1 tablet Sublingual Daily  . gabapentin  300 mg Oral TID  . glipiZIDE  2.5 mg Oral QAC breakfast  . insulin aspart  0-15 Units Subcutaneous TID WC  . insulin aspart  0-5 Units Subcutaneous QHS  . lidocaine  1 patch Transdermal Q24H  . methocarbamol  500 mg Oral TID  . nicotine  21 mg Transdermal Daily  . pantoprazole  40 mg  Oral Daily  . polyethylene glycol  17 g Oral BID  . QUEtiapine  50 mg Oral QHS   Continuous Infusions: . penicillin G 2.5 - 3.5 MILLION UNITS IVPB 3 Million Units (06/20/17 1314)     LOS: 16 days    Time spent: 35 minutes.     Elmarie Shiley, MD [facility] Pager [phone]  If 7PM-7AM, please contact night-coverage [email or web address] Password [email or web address] 06/20/2017, 4:05 PM

## 2017-06-20 NOTE — Plan of Care (Signed)
  Problem: Pain Managment: Goal: General experience of comfort will improve Outcome: Progressing   Problem: Skin Integrity: Goal: Risk for impaired skin integrity will decrease Outcome: Progressing   

## 2017-06-21 DIAGNOSIS — Z5181 Encounter for therapeutic drug level monitoring: Secondary | ICD-10-CM

## 2017-06-21 DIAGNOSIS — F1910 Other psychoactive substance abuse, uncomplicated: Secondary | ICD-10-CM

## 2017-06-21 LAB — GLUCOSE, CAPILLARY
Glucose-Capillary: 209 mg/dL — ABNORMAL HIGH (ref 65–99)
Glucose-Capillary: 132 mg/dL — ABNORMAL HIGH (ref 65–99)
Glucose-Capillary: 149 mg/dL — ABNORMAL HIGH (ref 65–99)
Glucose-Capillary: 172 mg/dL — ABNORMAL HIGH (ref 65–99)

## 2017-06-21 MED ORDER — CYCLOBENZAPRINE HCL 10 MG PO TABS
5.0000 mg | ORAL_TABLET | Freq: Three times a day (TID) | ORAL | Status: DC
  Administered 2017-06-21 – 2017-06-23 (×6): 5 mg via ORAL
  Filled 2017-06-21 (×6): qty 1

## 2017-06-21 NOTE — Progress Notes (Signed)
Physical Therapy Treatment Patient Details Name: Naida SleightLee W Wardle MRN: [id number] DOB: 07/26/1975 Today's Date: 06/21/2017    History of Present Illness Pt 42 y.o. male with medical history significant for bipolar disorder, chronic low back pain, tobacco abuse, hepatitis C, polysubstance abuse disorder with IV drug abuse, and GERD who was recently discharged from [facility] on 05/26/2017 after treatment for acute discitis/osteomyelitis in his lumbar spine. Pt now s/p I&D of L2/3 disc space on 5/30.    PT Comments    Pt performed gait training and functional mobility with continued VCs to maintain spinal precautions.  Pt required IV dilaudid pre tx for participation.  Pt slow and guarded with heavy reliance on UEs.  Pt remains to require SNF placement to improve strength, function and endurance before returning home.  Pt educated on the importance of mobility and maintaining spinal precautions.  Im not certain he is hearing the information being provided despite best efforts.    Follow Up Recommendations  SNF     Equipment Recommendations  Rolling walker with 5" wheels    Recommendations for Other Services       Precautions / Restrictions Precautions Precautions: Fall Restrictions Weight Bearing Restrictions: No    Mobility  Bed Mobility Overal bed mobility: Modified Independent Bed Mobility: Rolling;Sidelying to Sit;Sit to Supine Rolling: Supervision Sidelying to sit: Supervision       General bed mobility comments: Despite education pt with poor log roll technqiue. Increased time and effort but pt able to perform with supervision.  Transfers Overall transfer level: Needs assistance Equipment used: Rolling walker (2 wheeled) Transfers: Sit to/from Stand Sit to Stand: Min guard         General transfer comment: prefers to push up from RW with both hands with increased time and effort.   Ambulation/Gait Ambulation/Gait assistance: Min assist Ambulation Distance (Feet):  200 Feet(x2 seated rest break on bench in hall.  ) Assistive device: Rolling walker (2 wheeled) Gait Pattern/deviations: Step-through pattern;Trunk flexed;Decreased stride length Gait velocity: decreased   General Gait Details: Heavy reliance on UE cues for relaxing shoulders, improving posture and increasing stride length.  Cues for weight shifting and he tends to swing limbs forward due to heavy reliance on UEs.     Stairs             Wheelchair Mobility    Modified Rankin (Stroke Patients Only)       Balance Overall balance assessment: Needs assistance   Sitting balance-Leahy Scale: Good       Standing balance-Leahy Scale: Poor                              Cognition Arousal/Alertness: Awake/alert Behavior During Therapy: Impulsive Overall Cognitive Status: No family/caregiver present to determine baseline cognitive functioning Area of Impairment: Attention;Following commands;Safety/judgement;Problem solving                   Current Attention Level: Selective   Following Commands: Follows one step commands with increased time Safety/Judgement: Decreased awareness of safety;Decreased awareness of deficits   Problem Solving: Decreased initiation;Difficulty sequencing;Requires verbal cues General Comments: Pt remains impulsive and requires constant re-direction to maintain spinal precautions.        Exercises      General Comments        Pertinent Vitals/Pain Pain Assessment: 0-10 Pain Score: 8  Pain Location: back Pain Descriptors / Indicators: Aching;Discomfort Pain Intervention(s): Premedicated before session;Repositioned;Monitored during session(IV dilaudid  pre med)    Home Living                      Prior Function            PT Goals (current goals can now be found in the care plan section) Acute Rehab PT Goals Patient Stated Goal: decrease pain Potential to Achieve Goals: Fair Progress towards PT goals:  Progressing toward goals    Frequency    Min 2X/week      PT Plan Current plan remains appropriate    Co-evaluation              AM-PAC PT "6 Clicks" Daily Activity  Outcome Measure  Difficulty turning over in bed (including adjusting bedclothes, sheets and blankets)?: A Lot Difficulty moving from lying on back to sitting on the side of the bed? : A Lot   Help needed moving to and from a bed to chair (including a wheelchair)?: A Little Help needed walking in hospital room?: A Little Help needed climbing 3-5 steps with a railing? : A Little 6 Click Score: 13    End of Session Equipment Utilized During Treatment: Gait belt Activity Tolerance: Patient tolerated treatment well Patient left: in bed;with call bell/phone within reach Nurse Communication: Mobility status PT Visit Diagnosis: Pain;Other abnormalities of gait and mobility (R26.89) Pain - part of body: (back)     Time: 1610-9604 PT Time Calculation (min) (ACUTE ONLY): 31 min  Charges:  $Gait Training: 8-22 mins $Therapeutic Activity: 8-22 mins                    G Codes:       Joycelyn Rua, PTA pager [phone]    Florestine Avers 06/21/2017, 2:59 PM

## 2017-06-21 NOTE — Progress Notes (Signed)
PROGRESS NOTE    Joshua Thomas  ZOX:[id number] DOB: Apr 10, 1975 DOA: 06/04/2017 PCP: Patient, No Pcp Per    Brief Narrative: Brief Narrative:42 y.o.malewith medical history significant forbipolar disorder, chronic low back pain, tobacco abuse, hepatitis C, polysubstance abuse disorder with IV drug abuse, and GERD [facility] after treatment for acute discitis/osteomyelitis in his lumbar spine along with strep viridans bacteremia with plans to continue amoxicillin for a total of 6 weeks. He was also recently started on Suboxone a few days ago due to opioid use disorder. Hepresented again with acute flare of his chronic low back pain that has been ongoing for the last week. There was no signs of cauda equina with any bowel or bladder incontinence or saddle anesthesia. He states that he has been compliant with his home antibiotics and has recently used IV cocaine. He underwent an MRI of his low back earlier todaydemonstrating severe progressive discitis/osteomyelitis at L2/3 with epidural phlegmonous changes that appeared drainable with increase in size from prior exam. ED physician at that time consulted with neurosurgery Dr. Jordan Likes at [facility] who reviewed his MRI images with no acute surgical issue noted at thistime. Discussion was also had with ID physician Dr. Alphonsa Gin recommended withholding antibiotics at this time with transfer to [facility] for IR consult and aspiration. It appears that patient's pain was not well controlled and became upset and left AMA. He has now returned with unrelenting pain and is agreeable for transfer. He denies any fevers, chills, focal motor weakness, numbness, or tingling in his extremities.  Admitted with progressive diskitis. He has been getting IV antibiotics. Plan is to discharge home in few days on oral levaquin.   Assessment & Plan:   Principal Problem:   Acute osteomyelitis of lumbar spine  (HCC) Active Problems:   Chronic hepatitis C virus genotype 1a infection (HCC)   Opioid use disorder, moderate, dependence (HCC)   GERD (gastroesophageal reflux disease)   Bipolar disorder (HCC)   Discitis   Osteomyelitis (HCC)  Severe progressive L2/3 discitis/osteomyelitis with epidural phlegmonous changes  Recent Viridans Strep Bacteremia Fluid culture;  Needs total 6 weeks treatment. Day 16 treatment.  He is willing to be off suboxone  for time be at SNF/ will contact Dr [name]  On IV penicillin.  Continue with  ibuprofen for pain prn. He relates Toradol helps with pain  Plan to discharge in 1 or 2 days on oral antibiotics.   HTN;  Continue with  Norvasc  Headache; Fioricet PRN.   Hyponatremia. Follow trend.   Depression;  On Cymbalta.   Opiate Dependence:Stable, continue Suboxone follow outpatient Plan was to taper off suboxone to get patient into SNF. Plan has change. He will be discharge home.  Continue with suboxone.  Day 6 daily dose of Suboxone 8 mg. Continue.   Hepatitis C:outpatient follow up, no history of treatment  HIV screening -screening was negative  DM; started on glipizide.      DVT prophylaxis: scd Code Status: full code.  Family Communication: care discussed with patient.  Disposition Plan: remain inpatient for IV antibiotics.  Consultants:   ID.    Procedures:  Procedures: Status post aspiration of the lesion L2 and L3 spine 06/08/2017.  Antimicrobials:   Penicillin 6-03     Subjective: His back pain is n=better. He has been able to ambulate more.    Objective: Vitals:   06/20/17 1415 06/20/17 2145 06/21/17 0405 06/21/17 1445  BP: 136/70 116/84 124/79 (!) 136/93  Pulse:  89 81 89  Resp:  18 18 18   Temp: 98.8 F (37.1 C) 98.5 F (36.9 C) 98.3 F (36.8 C) 98.9 F (37.2 C)  TempSrc: Oral Oral Oral Oral  SpO2: 100% 100% 100% 100%  Weight:      Height:        Intake/Output Summary (Last 24 hours) at  06/21/2017 1750 Last data filed at 06/21/2017 1740 Gross per 24 hour  Intake 1560 ml  Output 4050 ml  Net -2490 ml   Filed Weights   06/04/17 0219 06/08/17 0849  Weight: 72.6 kg (160 lb) 72.6 kg (160 lb)    Examination:  General exam: NAD Respiratory system: CTA Cardiovascular system: S 1, S 2 RRR Gastrointestinal system: BS present,, soft, nt Central nervous system: non focal.  Extremities: Symmetric power.  Skin: No rashes.     Data Reviewed: I have personally reviewed following labs and imaging studies  CBC: Recent Labs  Lab 06/15/17 0343 06/16/17 0308  WBC 5.2 5.6  HGB 12.6* 12.9*  HCT 39.4 40.5  MCV 94.3 95.3  PLT 180 189   Basic Metabolic Panel: Recent Labs  Lab 06/15/17 0343 06/16/17 0308 06/18/17 0333  NA 136 136 136  K 4.5 4.5 4.1  CL 105 103 101  CO2 26 24 27   GLUCOSE 187* 206* 218*  BUN 15 14 15   CREATININE 0.86 0.89 0.81  CALCIUM 9.1 9.5 9.4   GFR: Estimated Creatinine Clearance: 106.2 mL/min (by C-G formula based on SCr of 0.81 mg/dL). Liver Function Tests: No results for input(s): AST, ALT, ALKPHOS, BILITOT, PROT, ALBUMIN in the last 168 hours. No results for input(s): LIPASE, AMYLASE in the last 168 hours. No results for input(s): AMMONIA in the last 168 hours. Coagulation Profile: No results for input(s): INR, PROTIME in the last 168 hours. Cardiac Enzymes: No results for input(s): CKTOTAL, CKMB, CKMBINDEX, TROPONINI in the last 168 hours. BNP (last 3 results) No results for input(s): PROBNP in the last 8760 hours. HbA1C: No results for input(s): HGBA1C in the last 72 hours. CBG: Recent Labs  Lab 06/20/17 1612 06/20/17 2148 06/21/17 0644 06/21/17 1141 06/21/17 1656  GLUCAP 162* 173* 209* 132* 149*   Lipid Profile: No results for input(s): CHOL, HDL, LDLCALC, TRIG, CHOLHDL, LDLDIRECT in the last 72 hours. Thyroid Function Tests: No results for input(s): TSH, T4TOTAL, FREET4, T3FREE, THYROIDAB in the last 72 hours. Anemia  Panel: No results for input(s): VITAMINB12, FOLATE, FERRITIN, TIBC, IRON, RETICCTPCT in the last 72 hours. Sepsis Labs: No results for input(s): PROCALCITON, LATICACIDVEN in the last 168 hours.  No results found for this or any previous visit (from the past 240 hour(s)).       Radiology Studies: No results found.      Scheduled Meds: . amLODipine  5 mg Oral Daily  . buprenorphine-naloxone  1 tablet Sublingual Daily  . cyclobenzaprine  5 mg Oral TID  . gabapentin  300 mg Oral TID  . glipiZIDE  2.5 mg Oral QAC breakfast  . insulin aspart  0-15 Units Subcutaneous TID WC  . insulin aspart  0-5 Units Subcutaneous QHS  . lidocaine  1 patch Transdermal Q24H  . nicotine  21 mg Transdermal Daily  . pantoprazole  40 mg Oral Daily  . polyethylene glycol  17 g Oral BID  . QUEtiapine  50 mg Oral QHS   Continuous Infusions: . penicillin G 2.5 - 3.5 MILLION UNITS IVPB Stopped (06/21/17 1552)     LOS: 17 days  Time spent: 35 minutes.     Alba CoryBelkys A Phong Isenberg, MD [facility] Pager [phone]  If 7PM-7AM, please contact night-coverage [email or web address] Password [email or web address] 06/21/2017, 5:50 PM

## 2017-06-21 NOTE — Progress Notes (Addendum)
Occupational Therapy Treatment Patient Details Name: Joshua Thomas MRN: [id number] DOB: 01-14-1975 Today's Date: 06/21/2017    History of present illness Pt 42 y.o. male with medical history significant for bipolar disorder, chronic low back pain, tobacco abuse, hepatitis C, polysubstance abuse disorder with IV drug abuse, and GERD who was recently discharged from [facility] on 05/26/2017 after treatment for acute discitis/osteomyelitis in his lumbar spine. Pt now s/p I&D of L2/3 disc space on 5/30.   OT comments  Pt continues to demonstrate poor adherence to back precautions and safety precautions despite maximum education from OT. He was able to ambulate to bathroom to complete grooming tasks with min guard assist. Pt with significant reliance on BUE support from RW and attempting to lean over onto sink during face washing. Provided seat for pt in order to improve safety and decrease bending in order to improve back pain; however, he continued to attempt bending over sink. Pt continues to require SNF level rehabilitation in order to maximize return to PLOF. Will continue to follow while admitted.    Follow Up Recommendations  SNF    Equipment Recommendations  3 in 1 bedside commode;Tub/shower seat;Other (comment)(AE kit if pt will utilize)    Recommendations for Other Services      Precautions / Restrictions Precautions Precautions: Fall;Back Precaution Booklet Issued: No Precaution Comments: Implemented back precautions for comfort Restrictions Weight Bearing Restrictions: No       Mobility Bed Mobility Overal bed mobility: Needs Assistance Bed Mobility: Rolling;Sidelying to Sit;Sit to Supine Rolling: Supervision Sidelying to sit: Supervision   Sit to supine: Supervision   General bed mobility comments: After maximum education as detailed above in general ADL comments, he was able to complete log roll technique with supervision for safety. He requires maximum education and  continues to break precautions.   Transfers Overall transfer level: Needs assistance Equipment used: Rolling walker (2 wheeled) Transfers: Sit to/from Stand Sit to Stand: Min guard         General transfer comment: Pushes up from RW with both hands despite education concerning safe technique.     Balance Overall balance assessment: Needs assistance Sitting-balance support: Feet supported;Bilateral upper extremity supported Sitting balance-Leahy Scale: Poor Sitting balance - Comments: B UE support due to pain   Standing balance support: Bilateral upper extremity supported Standing balance-Leahy Scale: Poor Standing balance comment: Heavy reliance on RW                           ADL either performed or assessed with clinical judgement   ADL Overall ADL's : Needs assistance/impaired     Grooming: Set up;Sitting;Min guard;Wash/dry face Grooming Details (indicate cue type and reason): Initially attempting standing but pt is unable to maintain upright position at sink without bending over and required cues and max encouragement to sit for washing face. Pt then standing and not compliant with instructions to bend over sink with min guard assist for stability. He continued to break precautions despite maximum education, encouragement, and instruction.              Lower Body Dressing: Maximal assistance;Sit to/from stand Lower Body Dressing Details (indicate cue type and reason): Unable to cross feet over knees this session. Educated concerning adaptive equipment but pt declines and reports preference to have assistance from family/friends.  Toilet Transfer: Min guard;Ambulation;RW Toilet Transfer Details (indicate cue type and reason): Close min guard assist with pt highly impulsive and unsafe with RW.  Simulated with sit<>stand followed by functional mobility in room.          Functional mobility during ADLs: Min guard;Rolling walker General ADL Comments: Educated pt  concerning log roll techniques; however, he continues to break precautions during bed mobility. On my arrival, he was twisted in bed eating ice cream halway sitting up. Educated concerning importance of adhering to back precautions to limit pain and improve safety. Pt was able to complete log roll once educated although he is reluctant. Very reluctant to adhere to no bending precautions despite maximum education and does not adhere to instructions given by OT.      Vision       Perception     Praxis      Cognition Arousal/Alertness: Awake/alert Behavior During Therapy: Impulsive Overall Cognitive Status: No family/caregiver present to determine baseline cognitive functioning Area of Impairment: Attention;Following commands;Safety/judgement;Problem solving                   Current Attention Level: Selective   Following Commands: Follows one step commands with increased time Safety/Judgement: Decreased awareness of safety;Decreased awareness of deficits   Problem Solving: Decreased initiation;Difficulty sequencing;Requires verbal cues General Comments: Pt impulsive and requires max cues to adhere to back precautions. He tends to refute instructions from therapist.         Exercises     Shoulder Instructions       General Comments      Pertinent Vitals/ Pain       Pain Assessment: Faces Pain Score: 8  Faces Pain Scale: Hurts even more Pain Location: back Pain Descriptors / Indicators: Aching;Discomfort Pain Intervention(s): Limited activity within patient's tolerance;Monitored during session;Repositioned;Premedicated before session(approximately 1 hour prior to session pain meds given)  Home Living                                          Prior Functioning/Environment              Frequency  Min 2X/week        Progress Toward Goals  OT Goals(current goals can now be found in the care plan section)  Progress towards OT goals:  Progressing toward goals  Acute Rehab OT Goals Patient Stated Goal: decrease pain OT Goal Formulation: With patient Time For Goal Achievement: 06/24/17 Potential to Achieve Goals: Good  Plan Discharge plan remains appropriate    Co-evaluation                 AM-PAC PT "6 Clicks" Daily Activity     Outcome Measure   Help from another person eating meals?: None Help from another person taking care of personal grooming?: A Little Help from another person toileting, which includes using toliet, bedpan, or urinal?: A Little Help from another person bathing (including washing, rinsing, drying)?: A Little Help from another person to put on and taking off regular upper body clothing?: A Little Help from another person to put on and taking off regular lower body clothing?: A Little 6 Click Score: 19    End of Session Equipment Utilized During Treatment: Rolling walker  OT Visit Diagnosis: Other abnormalities of gait and mobility (R26.89);Pain Pain - part of body: (back)   Activity Tolerance Patient tolerated treatment well   Patient Left in bed;with call bell/phone within reach   Nurse Communication          Time: [phone]  OT Time Calculation (min): 20 min  Charges: OT General Charges $OT Visit: 1 Visit OT Treatments $Self Care/Home Management : 8-22 mins  Norman Herrlich, MS OTR/L  Pager: [place] A [place] 06/21/2017, 5:38 PM

## 2017-06-21 NOTE — Progress Notes (Signed)
[place] for Infectious Disease   Reason for visit: Follow up on discitis  Interval History: continues to feel better, ESR 44, CRP 1.1.  No associated rash, diarrhea.  No new complaints.  Walking further.   Physical Exam: Constitutional:  Vitals:   06/20/17 2145 06/21/17 0405  BP: 116/84 124/79  Pulse: 89 81  Resp: 18 18  Temp: 98.5 F (36.9 C) 98.3 F (36.8 C)  SpO2: 100% 100%   patient appears in NAD Eyes: anicteric HENT: no thrush Respiratory: Normal respiratory effort; CTA B Cardiovascular: RRR Skin: no rash  Review of Systems: Constitutional: negative for fevers and chills Gastrointestinal: negative for diarrhea Integument/breast: negative for rash  Lab Results  Component Value Date   WBC 5.6 06/16/2017   HGB 12.9 (L) 06/16/2017   HCT 40.5 06/16/2017   MCV 95.3 06/16/2017   PLT 189 06/16/2017    Lab Results  Component Value Date   CREATININE 0.81 06/18/2017   BUN 15 06/18/2017   NA 136 06/18/2017   K 4.1 06/18/2017   CL 101 06/18/2017   CO2 27 06/18/2017    Lab Results  Component Value Date   ALT 34 06/07/2017   AST 22 06/07/2017   ALKPHOS 52 06/07/2017     Microbiology: No results found for this or any previous visit (from the past 240 hour(s)).  Impression/Plan:  1. Discitis - Strep and doing well, improving each day.  Inflammatory markers improved.  I feel it is ok to transition to oral levaquin at discharge, 750 mg daily.  Will need to get him a 1 month supply prior to discharge since he is uninsured.  He is going to discuss this with his mother and timing of discharge but from an ID standpoint, oral levaquin for continuation is a good option.    2.  Medication monitoring - will need weekly cmp on levaquin. I will do this in my office.    3.  Substance abuse - he can continue with suboxone and can follow up with Dr. Heber [place] for this.    4.  Hepatitis C - genotype 1a, hepatitis A and B non-immune.  I will treat him as an ouptatient.      I will arrange follow up with me next week.    Let me know if he is not discharged in the next one to two days, otherwise I will sign off, thanks

## 2017-06-21 NOTE — Social Work (Signed)
CSW f/u for disposition.  Pt is a difficult to place at this time.  No new needs.  Keene BreathPatricia Kalan Yeley, LCSW Clinical Social Worker [phone]

## 2017-06-21 NOTE — Plan of Care (Signed)
  Problem: Safety: Goal: Ability to remain free from injury will improve Outcome: Progressing   Problem: Pain Managment: Goal: General experience of comfort will improve Outcome: Progressing   Problem: Skin Integrity: Goal: Risk for impaired skin integrity will decrease Outcome: Progressing   

## 2017-06-22 LAB — GLUCOSE, CAPILLARY
GLUCOSE-CAPILLARY: 161 mg/dL — AB (ref 65–99)
Glucose-Capillary: 207 mg/dL — ABNORMAL HIGH (ref 65–99)
Glucose-Capillary: 194 mg/dL — ABNORMAL HIGH (ref 65–99)
Glucose-Capillary: 195 mg/dL — ABNORMAL HIGH (ref 65–99)

## 2017-06-22 MED ORDER — HYDROMORPHONE HCL 2 MG/ML IJ SOLN
0.5000 mg | Freq: Four times a day (QID) | INTRAMUSCULAR | Status: DC | PRN
  Administered 2017-06-22: 0.5 mg via INTRAVENOUS
  Filled 2017-06-22: qty 1

## 2017-06-22 NOTE — Progress Notes (Addendum)
PROGRESS NOTE    Joshua Thomas  ZOX:[id number] DOB: 10/01/1975 DOA: 06/04/2017 PCP: Patient, No Pcp Per    Brief Narrative: Brief Narrative:  42 y.o.malewith medical history significant forbipolar disorder, chronic low back pain, tobacco abuse, hepatitis C, polysubstance abuse disorder with IV drug abuse, and GERD whowas [facility] after treatment for acute discitis/osteomyelitis in his lumbar spine along with strep viridans bacteremia with plans to continue amoxicillin for a total of 6 weeks. He was also recently started on Suboxone a few days ago due to opioid use disorder. Hepresented again with acute flare of his chronic low back pain. He underwent an MRI of his low back demonstrating severe progressive discitis/osteomyelitis at L2/3 with epidural phlegmonous changes that appeared drainable with increase in size from prior exam. ED physician at that time consulted with Neurosurgery Dr[facility] Jordan LikesPool [facility] who reviewed his MRI images with no acute surgical issue noted at thistime. Discussion was also had with ID physician Dr. Alphonsa GinSniderwho recommended withholding antibiotics & [facility] and aspiration. Patient's pain was not well controlled and became upset and left AMA. He returned with unrelenting pain.   Admitted with progressive diskitis. He has been getting IV antibiotics. Plan is to discharge home in few days on oral levaquin.   Assessment & Plan:   Principal Problem:   Acute osteomyelitis of lumbar spine (HCC) Active Problems:   Chronic hepatitis C virus genotype 1a infection (HCC)   Opioid use disorder, moderate, dependence (HCC)   GERD (gastroesophageal reflux disease)   Bipolar disorder (HCC)   Discitis   Osteomyelitis (HCC)  Severe progressive L2/3 discitis/osteomyelitis with epidural phlegmonous changes  Recent Viridans Strep Bacteremia Vertebral abscess fluid culture 5/29: No growth, final  report. Needs total 6 weeks treatment. Day 16 treatment.  Remains on IV penicillin. As per ID follow-up 6/11, improving with improved inflammatory markers.  ID feels that it is okay to transition to oral levofloxacin 750 mg daily at discharge x1 month.  Will need weekly CMP while on Levaquin which can be followed by ID as outpatient.  Outpatient follow-up with ID next week. Pain control: Remains on IV/p.o. Dilaudid, lidocaine patch, gabapentin.  Robaxin was changed to Flexeril for muscle spasms.  Essential hypertension Continue with  Norvasc.  Mostly controlled.  Headache;  Fioricet PRN.  Has not complained today.  Hyponatremia.  Resolved.  Depression;  On Cymbalta.  Stable.  Opiate Dependence: Stable, continue Suboxone follow outpatient Plan was to taper off suboxone to get patient into SNF. Plan has changed. He will be discharged home.  Continue with suboxone.  Day 6 daily dose of Suboxone 8 mg. Continue an outpatient follow-up with Dr. Mikey BussingHoffman.   Hepatitis C: outpatient follow up, no history of treatment.  ID will follow up and treat as outpatient.  HIV screening  -screening was negative  DM;  started on glipizide and is also on SSI.  Mildly uncontrolled and fluctuating at times.  Tobacco abuse Cessation counseled.  Continue nicotine patch.  Addendum  RN paged me back because patient was upset and under the impression that he would be going home tomorrow without pain medicines.  I called patient back in the room and discussed in detail.  He does not recollect most of the discussion we had this morning and stated that "I was sleeping".  I advised him to try and minimize IV pain medicine use, use of oral pain medications to determine if his pain is controlled adequately  over the next day or 2 to consider discharging home on oral antibiotics.  He indicates that Dilaudid does not work for him and may consider switching him back to prior home OxyIR at discharge.  I also  advised him that we can only prescribe a very short course of approximately 5 days of oral pain medications at discharge.  He verbalized understanding of all this discussion.  He seems agreeable with this plan.  Patient plans to update his family with this discussion as well.  I also updated charge nurse regarding this.   DVT prophylaxis: SCD's Code Status: Full code.  Family Communication: None at bedside Disposition Plan: DC home pending clinical improvement, possibly in the next 2-3 days. .  Consultants:   ID.   Procedures:  Status post aspiration of the lesion L2 and L3 spine 06/08/2017.  Antimicrobials:   Penicillin 6-03   Subjective: Patient reports occasional back spasms, not much pain. Denies any other complaints.  As per RN, no acute issues reported and looking forward to going home soon.   Objective: Vitals:   06/20/17 2145 06/21/17 0405 06/21/17 1445 06/21/17 2003  BP: 116/84 124/79 (!) 136/93 (!) 126/92  Pulse: 89 81 89 97  Resp: 18 18 18    Temp: 98.5 F (36.9 C) 98.3 F (36.8 C) 98.9 F (37.2 C) 98.8 F (37.1 C)  TempSrc: Oral Oral Oral   SpO2: 100% 100% 100% 100%  Weight:      Height:        Intake/Output Summary (Last 24 hours) at 06/22/2017 1601 Last data filed at 06/22/2017 0900 Gross per 24 hour  Intake 720 ml  Output 2700 ml  Net -1980 ml   Filed Weights   06/04/17 0219 06/08/17 0849  Weight: 72.6 kg (160 lb) 72.6 kg (160 lb)    Examination:  General exam: Pleasant young male, moderately built and nourished, noted having a back spasm while interviewing examining him. Respiratory system: Clear to auscultation.  No increased work of breathing. Cardiovascular system: S1 and S2 heard, RRR.  No JVD, murmurs or pedal edema. Gastrointestinal system: Abdomen is nondistended, soft and nontender.  Normal bowel sounds heard. Central nervous system: Alert and oriented x3.  No focal neurological deficits. Extremities: Symmetric 5 x 5  power. Musculoskeletal system: Noted right lumbar paraspinal muscle transient spasm. Skin: No rashes.     Data Reviewed: I have personally reviewed following labs and imaging studies  CBC: Recent Labs  Lab 06/16/17 0308  WBC 5.6  HGB 12.9*  HCT 40.5  MCV 95.3  PLT 189   Basic Metabolic Panel: Recent Labs  Lab 06/16/17 0308 06/18/17 0333  NA 136 136  K 4.5 4.1  CL 103 101  CO2 24 27  GLUCOSE 206* 218*  BUN 14 15  CREATININE 0.89 0.81  CALCIUM 9.5 9.4   GFR: Estimated Creatinine Clearance: 106.2 mL/min (by C-G formula based on SCr of 0.81 mg/dL).  CBG: Recent Labs  Lab 06/21/17 1141 06/21/17 1656 06/21/17 2247 06/22/17 0617 06/22/17 1214  GLUCAP 132* 149* 172* 207* 161*      Radiology Studies: No results found.      Scheduled Meds: . amLODipine  5 mg Oral Daily  . buprenorphine-naloxone  1 tablet Sublingual Daily  . cyclobenzaprine  5 mg Oral TID  . gabapentin  300 mg Oral TID  . glipiZIDE  2.5 mg Oral QAC breakfast  . insulin aspart  0-15 Units Subcutaneous TID WC  . insulin aspart  0-5 Units Subcutaneous QHS  .  lidocaine  1 patch Transdermal Q24H  . nicotine  21 mg Transdermal Daily  . pantoprazole  40 mg Oral Daily  . polyethylene glycol  17 g Oral BID  . QUEtiapine  50 mg Oral QHS   Continuous Infusions: . penicillin G 2.5 - 3.5 MILLION UNITS IVPB Stopped (06/22/17 1044)     LOS: 18 days    Time spent: 35 minutes.     Marcellus Scott, MD, FACP, [facility]. [facility] Pager [phone]  If 7PM-7AM, please contact night-coverage [email or web address] Password [facility] 06/22/2017, 4:15 PM

## 2017-06-23 LAB — GLUCOSE, CAPILLARY
Glucose-Capillary: 194 mg/dL — ABNORMAL HIGH (ref 65–99)
Glucose-Capillary: 270 mg/dL — ABNORMAL HIGH (ref 65–99)

## 2017-06-23 MED ORDER — CYCLOBENZAPRINE HCL 5 MG PO TABS: 5.0000 mg | ORAL_TABLET | Freq: Three times a day (TID) | ORAL | 0 refills | Status: DC | PRN

## 2017-06-23 MED ORDER — BLOOD GLUCOSE MONITOR KIT: PACK | 0 refills | Status: DC

## 2017-06-23 MED ORDER — AMLODIPINE BESYLATE 5 MG PO TABS: 5.0000 mg | ORAL_TABLET | Freq: Every day | ORAL | 0 refills | Status: DC

## 2017-06-23 MED ORDER — OXYCODONE HCL 10 MG PO TABS: 10.0000 mg | ORAL_TABLET | Freq: Four times a day (QID) | ORAL | 0 refills | Status: DC | PRN

## 2017-06-23 MED ORDER — GABAPENTIN 300 MG PO CAPS: 300.0000 mg | ORAL_CAPSULE | Freq: Three times a day (TID) | ORAL | 0 refills | Status: DC

## 2017-06-23 MED ORDER — LEVOFLOXACIN 750 MG PO TABS: 750.0000 mg | ORAL_TABLET | Freq: Every day | ORAL | 0 refills | Status: AC

## 2017-06-23 MED ORDER — SENNOSIDES-DOCUSATE SODIUM 8.6-50 MG PO TABS: 1.0000 | ORAL_TABLET | Freq: Every evening | ORAL | 0 refills | Status: DC | PRN

## 2017-06-23 MED ORDER — NICOTINE 21 MG/24HR TD PT24: 21.0000 mg | MEDICATED_PATCH | Freq: Every day | TRANSDERMAL | 0 refills | Status: DC

## 2017-06-23 MED ORDER — GLIPIZIDE 5 MG PO TABS: 5.0000 mg | ORAL_TABLET | Freq: Every day | ORAL | 0 refills | Status: DC

## 2017-06-23 MED ORDER — QUETIAPINE FUMARATE 50 MG PO TABS: 50.0000 mg | ORAL_TABLET | Freq: Every day | ORAL | 0 refills | Status: DC

## 2017-06-23 MED ORDER — PANTOPRAZOLE SODIUM 40 MG PO TBEC: 40.0000 mg | DELAYED_RELEASE_TABLET | Freq: Every day | ORAL | 0 refills | Status: DC

## 2017-06-23 MED ORDER — LIDOCAINE 5 % EX PTCH: 1.0000 | MEDICATED_PATCH | CUTANEOUS | 0 refills | Status: DC

## 2017-06-23 MED ORDER — POLYETHYLENE GLYCOL 3350 17 G PO PACK: 17.0000 g | PACK | Freq: Two times a day (BID) | ORAL | 0 refills | Status: DC

## 2017-06-23 MED ORDER — BUPRENORPHINE HCL-NALOXONE HCL 8-2 MG SL SUBL: 1.0000 | SUBLINGUAL_TABLET | Freq: Every day | SUBLINGUAL | 0 refills | Status: AC

## 2017-06-23 MED FILL — FREESTYLE LANCETS: 25 days supply | Qty: 100 | Fill #0

## 2017-06-23 MED FILL — glipiZIDE 5 MG TABS: 5 | 30 days supply | Qty: 30 | Fill #0

## 2017-06-23 MED FILL — oxyCODONE HCL 10 MG TABS: 10 | 5 days supply | Qty: 20 | Fill #0

## 2017-06-23 MED FILL — CYCLOBENZAPRINE 5 MG TABLET: 5 | 10 days supply | Qty: 30 | Fill #0

## 2017-06-23 MED FILL — levoFLOXacin 750 MG TABS: 750 | 30 days supply | Qty: 30 | Fill #0

## 2017-06-23 MED FILL — LIDOCAINE PATCH 5%: 5 | 30 days supply | Qty: 30 | Fill #0

## 2017-06-23 MED FILL — PANTOPRAZOLE SOD DR 40 MG T: 40 | 30 days supply | Qty: 30 | Fill #0

## 2017-06-23 MED FILL — GABAPENTIN 300 MG CAPSULE: 300 | 30 days supply | Qty: 90 | Fill #0

## 2017-06-23 MED FILL — QUETIAPINE FUMARATE 50 MG T: 50 | 30 days supply | Qty: 30 | Fill #0

## 2017-06-23 MED FILL — FREESTYLE LITE METER: 30 days supply | Qty: 1 | Fill #0

## 2017-06-23 MED FILL — AMLODIPINE BESYLATE 5 MG TA: 5 | 30 days supply | Qty: 30 | Fill #0

## 2017-06-23 MED FILL — BUPRENORPHINE HCL-NALOXONE: 8-2 | 7 days supply | Qty: 7 | Fill #0

## 2017-06-23 MED FILL — FREESTYLE LITE TEST STRIP: 25 days supply | Qty: 100 | Fill #0

## 2017-06-23 NOTE — Progress Notes (Signed)
RN gave patient discharge instructions, RN showed pt all the medications that the MD [facility] RN showed pt how to take his BS and gave him his one physical script for his accu check machine. Pt is being discharged home, pt stated that he has a walker at home. IV has been removed, pt awaitimg his mom to pick him up. Pt has 13medications escribed to pharmacy pt has match paper with him.

## 2017-06-23 NOTE — Discharge Instructions (Signed)
Please get your medications reviewed and adjusted by your Primary MD. ° °Please request your Primary MD to go over all Hospital Tests and Procedure/Radiological results at the follow up, please get all Hospital records sent to your Prim MD by signing hospital release before you go home. ° °If you had Pneumonia of Lung problems at the Hospital: °Please get a 2 view Chest X ray done in 6-8 weeks after hospital discharge or sooner if instructed by your Primary MD. ° °If you have Congestive Heart Failure: °Please call your Cardiologist or Primary MD anytime you have any of the following symptoms:  °1) 3 pound weight gain in 24 hours or 5 pounds in 1 week  °2) shortness of breath, with or without a dry hacking cough  °3) swelling in the hands, feet or stomach  °4) if you have to sleep on extra pillows at night in order to breathe ° °Follow cardiac low salt diet and 1.5 lit/day fluid restriction. ° °If you have diabetes °Accuchecks 4 times/day, Once in AM empty stomach and then before each meal. °Log in all results and show them to your primary doctor at your next visit. °If any glucose reading is under 80 or above 300 call your primary MD immediately. ° °If you have Seizure/Convulsions/Epilepsy: °Please do not drive, operate heavy machinery, participate in activities at heights or participate in high speed sports until you have seen by Primary MD or a Neurologist and advised to do so again. ° °If you had Gastrointestinal Bleeding: °Please ask your Primary MD to check a complete blood count within one week of discharge or at your next visit. Your endoscopic/colonoscopic biopsies that are pending at the time of discharge, will also need to followed by your Primary MD. ° °Get Medicines reviewed and adjusted. °Please take all your medications with you for your next visit with your Primary MD ° °Please request your Primary MD to go over all hospital tests and procedure/radiological results at the follow up, please ask your  Primary MD to get all Hospital records sent to his/her office. ° °If you experience worsening of your admission symptoms, develop shortness of breath, life threatening emergency, suicidal or homicidal thoughts you must seek medical attention immediately by calling 911 or calling your MD immediately  if symptoms less severe. ° °You must read complete instructions/literature along with all the possible adverse reactions/side effects for all the Medicines you take and that have been prescribed to you. Take any new Medicines after you have completely understood and accpet all the possible adverse reactions/side effects.  ° °Do not drive or operate heavy machinery when taking Pain medications.  ° °Do not take more than prescribed Pain, Sleep and Anxiety Medications ° °Special Instructions: If you have smoked or chewed Tobacco  in the last 2 yrs please stop smoking, stop any regular Alcohol  and or any Recreational drug use. ° °Wear Seat belts while driving. ° °Please note °You were cared for by a hospitalist during your hospital stay. If you have any questions about your discharge medications or the care you received while you were in the hospital after you are discharged, you can call the unit and asked to speak with the hospitalist on call if the hospitalist that took care of you is not available. Once you are discharged, your primary care physician will handle any further medical issues. Please note that NO REFILLS for any discharge medications will be authorized once you are discharged, as it is imperative that you   return to your primary care physician (or establish a relationship with a primary care physician if you do not have one) for your aftercare needs so that they can reassess your need for medications and monitor your lab values.  You can reach the hospitalist office at phone [phone] or fax [phone]   If you do not have a primary care physician, you can call [phone] for a physician  referral.  Diskitis Diskitis is irritation and swelling (inflammation) of the disks in the spine. Disks are soft structures that cushion the bones of the spine. This is not a common condition. Diskitis most often affects the disks of the lower back (lumbar disks) or upper back (thoracic disks). What are the causes? A bacterial or viral infection can lead to diskitis. When diskitis develops, it is often accompanied by infection-induced inflammation of the bones (osteomyelitis) surrounding the spine. The condition can also be caused by inflammation from another condition, like an autoimmune disease. If you have an autoimmune disease, your body's defense system (immune system) mistakenly attacks your own healthy cells instead of germs and other things that can make you sick. What increases the risk? People at higher risk of developing diskitis include:  Children.  Older persons.  People with weak immune systems or immune system disorders.  People with diabetes.  People having chemotherapy.  What are the signs or symptoms? Back or stomach pain is the most common symptom of diskitis. Walking, standing, and sitting may be painful. Other symptoms may include:  Trouble standing or rising from a sitting position.  Fever lower than 102F (38.9C).  Back stiffness.  Flu-like symptoms, such as a sore throat and a runny nose.  Irritability.  Feeling pain when the affected area is touched.  How is this diagnosed? Your health care provider can diagnose diskitis based on symptoms and medical history. Your health care provider will also do a physical exam. You may also need to have blood tests or imaging studies, such as:  X-ray of the spine.  MRI of the spine.  A bone scan.  How is this treated? Treatment for diskitis may include:  Bed rest.  Medicines, such as: ? Antibiotics to treat a possible bacterial infection. ? Anti-inflammatory medicines. ? Steroids if the condition does  not improve over time. ? Pain-relieving medicines.  A brace to stop your back from moving.  Follow these instructions at home:  If you were prescribed an antibiotic medicine, finish it all even if you start to feel better.  Take medicines only as directed by your health care provider.  Keep all follow-up visits as directed by your health care provider. This is important. Contact a health care provider if:  You have difficulty walking or standing.  You have persistent back pain.  You are having side effects from medicines. This information is not intended to replace advice given to you by your health care provider. Make sure you discuss any questions you have with your health care provider. Document Released: 11/29/2003 Document Revised: 06/05/2015 Document Reviewed: 05/02/2013 Elsevier Interactive Patient Education  [employer]2018 [employer] Inc[employer]

## 2017-06-23 NOTE — Care Management Note (Signed)
Case Management Note  Patient Details  Name: Joshua Thomas MRN: [id number] Date of Birth: 08/15/1975  Subjective/Objective:   42 yr old male admitted with lumbar/discitis , osteomyelitis. Patient has been on IV antibiotics since admission.                 Action/Plan: Patient will discharge home on oral Levaquin 750 mg daily for 30 days. Patient will receive MATCH Letter, Case manager is explaining to patient that [facility] the [facility], he received this service in May of 2019 and permission was granted for this because it is antibiotic. Case manager has arranged for patient to [facility] July 19, 2017 at 2:10pm., He will also followup with Internal Medicine on Tuesday June 28, 2017. Patient will discharge home with his mom.   Expected Discharge Date:    06/23/17               Expected Discharge Plan:   Home/self Care  In-House Referral:     Discharge planning Services  MATCH Program, Indigent Health Clinic  Post Acute Care Choice:  NA Choice offered to:  NA  DME Arranged:  N/A DME Agency:  NA  HH Arranged:  NA HH Agency:  NA  Status of Service:  Completed, signed off  If discussed at Long Length of Stay Meetings, dates discussed:    Additional Comments:  Durenda GuthrieBrady, Edithe Dobbin Naomi, RN 06/23/2017, 12:32 PM

## 2017-06-23 NOTE — Progress Notes (Signed)
Occupational Therapy Treatment Patient Details Name: Joshua SleightLee W Blais MRN: [id number] DOB: 06/03/1975 Today's Date: 06/23/2017    History of present illness Pt 42 y.o. male with medical history significant for bipolar disorder, chronic low back pain, tobacco abuse, hepatitis C, polysubstance abuse disorder with IV drug abuse, and GERD who was recently discharged [facility]/26/2017 after treatment for acute discitis/osteomyelitis in his lumbar spine. Pt now s/p I&D of L2/3 disc space on 5/30.   OT comments  Pain continues to be limiting pt's ability to sit or stand for ADL as he is heavily dependent on his UEs. Educated in use of reacher and long handled bath sponge and issued. Continued reinforcement of back precautions for comfort.  Follow Up Recommendations  No OT follow up    Equipment Recommendations  3 in 1 bedside commode    Recommendations for Other Services      Precautions / Restrictions Precautions Precautions: Fall;Back Precaution Comments: Implemented back precautions for comfort Restrictions Weight Bearing Restrictions: No       Mobility Bed Mobility Overal bed mobility: Modified Independent             General bed mobility comments: pt inconsistent with log roll technique and heavily dependent on rail  Transfers Overall transfer level: Needs assistance Equipment used: Rolling walker (2 wheeled) Transfers: Sit to/from Stand Sit to Stand: Supervision         General transfer comment: supervision for safety, good technique    Balance     Sitting balance-Leahy Scale: Poor Sitting balance - Comments: B UE support due to pain   Standing balance support: Bilateral upper extremity supported Standing balance-Leahy Scale: Poor Standing balance comment: Heavy reliance on RW                           ADL either performed or assessed with clinical judgement   ADL Overall ADL's : Needs assistance/impaired     Grooming: Oral  care;Sitting Grooming Details (indicate cue type and reason): performed in supported sitting as pt heavily dependent on UE for pain relief in standing   Upper Body Bathing Details (indicate cue type and reason): issued long handled bath sponge   Lower Body Bathing Details (indicate cue type and reason): issued and instructed in use of long handle bath sponge Upper Body Dressing : Set up;Sitting   Lower Body Dressing: Sit to/from stand;Minimal assistance;Maximal assistance Lower Body Dressing Details (indicate cue type and reason): agreeable to use of reacher, instructed in use, plans to avoid socks Toilet Transfer: Supervision/safety;Ambulation;RW           Functional mobility during ADLs: Supervision/safety;Rolling walker General ADL Comments: encouraged back precautions for comfort     Vision       Perception     Praxis      Cognition Arousal/Alertness: Awake/alert Behavior During Therapy: Impulsive Overall Cognitive Status: No family/caregiver present to determine baseline cognitive functioning                                 General Comments: pt attempting to lie about what he was able to do, but then realized it was not to his benefit        Exercises     Shoulder Instructions       General Comments      Pertinent Vitals/ Pain       Pain Assessment: Faces  Faces Pain Scale: Hurts whole lot Pain Location: back Pain Descriptors / Indicators: Aching;Discomfort Pain Intervention(s): Monitored during session;Premedicated before session;Repositioned;Heat applied  Home Living                                          Prior Functioning/Environment              Frequency  Min 2X/week        Progress Toward Goals  OT Goals(current goals can now be found in the care plan section)  Progress towards OT goals: Progressing toward goals  Acute Rehab OT Goals Patient Stated Goal: decrease pain OT Goal Formulation: With  patient Time For Goal Achievement: 06/24/17 Potential to Achieve Goals: Good  Plan Discharge plan needs to be updated    Co-evaluation                 AM-PAC PT "6 Clicks" Daily Activity     Outcome Measure   Help from another person eating meals?: None Help from another person taking care of personal grooming?: A Little Help from another person toileting, which includes using toliet, bedpan, or urinal?: A Little Help from another person bathing (including washing, rinsing, drying)?: A Little Help from another person to put on and taking off regular upper body clothing?: A Little Help from another person to put on and taking off regular lower body clothing?: A Lot 6 Click Score: 18    End of Session Equipment Utilized During Treatment: Rolling walker;Gait belt  OT Visit Diagnosis: Other abnormalities of gait and mobility (R26.89);Pain   Activity Tolerance Patient tolerated treatment well   Patient Left in bed;with call bell/phone within reach   Nurse Communication Other (comment)(can have ensure)        Time: 6962-9528 OT Time Calculation (min): 29 min  Charges: OT General Charges $OT Visit: 1 Visit OT Treatments $Self Care/Home Management : 23-37 mins  06/23/2017 Martie Round, OTR/L Pager: [phone]   Iran Planas [place] Bailiff 06/23/2017, 12:04 PM

## 2017-06-23 NOTE — Discharge Planning (Signed)
I spoke with Dr Waymon AmatoHongalgi, Nedra HaiLee is to be discharged from the hospital today.  We will need to be resumed on outpatient MAT for his OUD with Suboxone.  His current dose is Suboxone 8-2mg  SL tablet daily.  I will provide a 1 week supply which can be picked [facility].  He will need to follow up with the Internal Medicine OUD clinic on Tuesday June 18th, I have arranged for an appointment at 11am (please arrive 15 minutes early).

## 2017-06-23 NOTE — Discharge Summary (Signed)
Physician Discharge Summary  STORY VANVRANKEN LOV:[id number] DOB: [birth year]  PCP: Patient, No Pcp Per  Admit date: 06/04/2017 Discharge date: 06/23/2017  Recommendations for Outpatient Follow-up:  1. [facility]/new PCP on 07/19/2017 at 2:10 PM. 2. Dr. Johnnette Gourd, Internal Medicine center on 06/28/2017 at 10:45 AM for Suboxone management. 3. Dr. Scharlene Gloss, ID on 06/29/2017 at 11:15 AM.  To be seen with repeat labs (CMP).  ID coordinated this appointment.  Home Health: None Equipment/Devices: Rolling walker and 3 in 1.  Discharge Condition: Improved and stable CODE STATUS: Full. Diet recommendation: Heart healthy & diabetic diet.  Discharge Diagnoses:  Principal Problem:   Acute osteomyelitis of lumbar spine ([place]) Active Problems:   Chronic hepatitis C virus genotype 1a infection ([place])   Opioid use disorder, moderate, dependence (HCC)   GERD (gastroesophageal reflux disease)   Bipolar disorder (HCC)   Discitis   Osteomyelitis (HCC)   Brief Summary:  42 y.o.malewith medical history significant forbipolar disorder, chronic low back pain, tobacco abuse, hepatitis C, polysubstance abuse disorder with IV drug abuse, and GERD whowas recently [facility] after treatment for acute discitis/osteomyelitis in his lumbar spine along with strep viridans bacteremia with plans to continue amoxicillin for a total of 6 weeks. He was also recently started on Suboxone a few days ago due to opioid use disorder. Hepresented again with acute flare of his chronic low back pain. He underwent an MRI of his low back demonstrating severe progressive discitis/osteomyelitis at L2/3 with epidural phlegmonous changes that appeared drainable with increase in size from prior exam. ED physician at that time consulted with Neurosurgery Dr. Annette Stable at [facility] who reviewed his MRI images with no acute surgical issue noted at thistime. Discussion was also  had with ID physician Dr. Jamelle Haring recommended withholding antibiotics & transfer to [facility] for IR consult and aspiration. Patient's pain was not well controlled and became upset and left AMA. He returned with unrelenting pain.   Admitted with progressive diskitis. He was given IV antibiotics as recommended by ID.   Assessment & Plan:   Severe progressive L2/3 discitis/osteomyelitis with epidural phlegmonous changes  Recent Viridans Strep Bacteremia Vertebral abscess fluid culture 5/29: No growth, final report. Needs total 6 weeks treatment.  Patient has been on IV antibiotics since admission 5/25: Penicillin G >IV ceftriaxone and finally penicillin G since 6/3 As per ID follow-up 6/11, improving with improved inflammatory markers.  ID feels that it is okay to transition to oral levofloxacin 750 mg daily at discharge x1 month.  Will need weekly CMP while on Levaquin which can be followed by ID as outpatient.  Outpatient follow-up with ID next week. Clinically improved.  Pain reasonably controlled.  Advised patient that we would like to minimize IV pain medications and see how he did with transitioning to oral OxyIR (his preference) for another day prior to discharge home.  Patient however insisted that he wanted to go home today.  He also specifically indicated that he did not want oral Dilaudid at discharge due to concern that he would abuse it. I discussed with Dr. Linus Salmons, ID who is okay with discharging him on 1 month supply of oral levofloxacin which has been arranged by case management and Dr. Linus Salmons has arranged outpatient follow-up including weekly labs, next appointment is for next week.  Essential hypertension Controlled on amlodipine, continue.  Headache;  Resolved.  Hyponatremia.  Resolved.  Depression;  Patient has been on Seroquel for couple weeks now  and stable.  Opiate Dependence: Patient was treated with Suboxone.  I personally discussed today with Dr. Joni Reining who has provided patient with prescription for Suboxone and will follow-up in clinic next week.  As discussed with Dr. Heber , okay to continue OxyIR for acute pain while on Suboxone.  Having BMs regularly.  Hepatitis C: outpatient follow up, no history of treatment.  ID will follow up and treat as outpatient.  HIV screening  -screening was negative  DM;  started on glipizide and is also on SSI.  Mildly uncontrolled and fluctuating at times.  At discharge, patient counseled regarding monitoring his CBGs outpatient and recording same.  Diabetic diet.  Glucotrol increased to 5 mg daily.  Follow closely with outpatient PCP.  A1c on 06/13/2017: 6.6.  Tobacco abuse Cessation counseled.  Continue nicotine patch.  Pain management Patient had several concerns regarding his pain management.  He initially was agreeable and asked to be discharged on OxyIR.  Subsequently he called asking if he could get both OxyIR and Dilaudid which we advised that we cannot do.  He was provided with short supply of OxyIR.  I reviewed the [place] controlled substance database.  He last filled OxyIR on 05/26/2017, 30 tablets / 7-day supply which he states that he ran out prior to this admission.  He will also continue on lidocaine patch, gabapentin, PRN Flexeril for muscle spasms.  If continues to have significant pain, recommend outpatient referral to pain management.     Consultants:   ID.   IR  Procedures:  Status post aspiration of the lesion L2 and L3 spine 06/08/2017.   Discharge Instructions  Discharge Instructions    Call MD for:  difficulty breathing, headache or visual disturbances   Complete by:  As directed    Call MD for:  extreme fatigue   Complete by:  As directed    Call MD for:  persistant dizziness or light-headedness   Complete by:  As directed    Call MD for:  persistant nausea and vomiting   Complete by:  As directed    Call MD for:  severe uncontrolled pain    Complete by:  As directed    Call MD for:  temperature >100.4   Complete by:  As directed    Diet - low sodium heart healthy   Complete by:  As directed    Diet Carb Modified   Complete by:  As directed    Increase activity slowly   Complete by:  As directed        Medication List    STOP taking these medications   amoxicillin 500 MG capsule Commonly known as:  AMOXIL   methocarbamol 500 MG tablet Commonly known as:  ROBAXIN   traZODone 50 MG tablet Commonly known as:  DESYREL     TAKE these medications   amLODipine 5 MG tablet Commonly known as:  NORVASC Take 1 tablet (5 mg total) by mouth daily. Start taking on:  06/24/2017   blood glucose meter kit and supplies Kit Dispense based on patient and insurance preference. Use up to four times daily as directed. (FOR ICD-9 250.00, 250.01).   buprenorphine-naloxone 8-2 mg Subl SL tablet Commonly known as:  SUBOXONE Place 1 tablet under the tongue daily for 7 days. Start taking on:  06/24/2017 What changed:  when to take this   cyclobenzaprine 5 MG tablet Commonly known as:  FLEXERIL Take 1 tablet (5 mg total) by mouth 3 (three) times daily  as needed for muscle spasms.   gabapentin 300 MG capsule Commonly known as:  NEURONTIN Take 1 capsule (300 mg total) by mouth 3 (three) times daily. What changed:  Another medication with the same name was removed. Continue taking this medication, and follow the directions you see here.   glipiZIDE 5 MG tablet Commonly known as:  GLUCOTROL Take 1 tablet (5 mg total) by mouth daily before breakfast. Start taking on:  06/24/2017   levofloxacin 750 MG tablet Commonly known as:  LEVAQUIN Take 1 tablet (750 mg total) by mouth daily.   lidocaine 5 % Commonly known as:  LIDODERM Place 1 patch onto the skin daily. Remove & Discard patch within 12 hours or as directed by MD. Apply to mid lower back. Start taking on:  06/24/2017   nicotine 21 mg/24hr patch Commonly known as:  NICODERM  CQ - dosed in mg/24 hours Place 1 patch (21 mg total) onto the skin daily. Start taking on:  06/24/2017   Oxycodone HCl 10 MG Tabs Take 1 tablet (10 mg total) by mouth every 6 (six) hours as needed (back pain).   pantoprazole 40 MG tablet Commonly known as:  PROTONIX Take 1 tablet (40 mg total) by mouth daily.   polyethylene glycol packet Commonly known as:  MIRALAX / GLYCOLAX Take 17 g by mouth 2 (two) times daily. What changed:    when to take this  reasons to take this   QUEtiapine 50 MG tablet Commonly known as:  SEROQUEL Take 1 tablet (50 mg total) by mouth at bedtime.   senna-docusate 8.6-50 MG tablet Commonly known as:  Senokot-S Take 1 tablet by mouth at bedtime as needed for mild constipation.      Follow-up Information    Lucious Groves, DO. Go on 06/28/2017.   Specialty:  Internal Medicine Why:  arrive at 10:45am for appointment in the Internal Medicine Center. Contact information: [place] [address] [place]. Go on 07/19/2017.   Why:  your appointment is scheduled for Tuesday, July 19, 2017 at 2:10pm Contact information: [place] [address] [phone]       Thayer Headings, MD Follow up on 06/29/2017.   Specialty:  Infectious Diseases Why:  11:15 AM.  To be seen with repeat labs (CMP). Contact information: [address] [place] [address] [phone]          Allergies  Allergen Reactions  . Acetaminophen Other (See Comments)    Due to Ulcers/ Liver Damage  . Buprenorphine Hcl Itching  . Meperidine Rash  . Morphine And Related Itching      Procedures/Studies: Mr Lumbar Spine W Wo Contrast (assess For Abscess, Cord Compression)  Result Date: 06/03/2017 CLINICAL DATA:  History of discitis and osteomyelitis. Severe back pain. EXAM: MRI LUMBAR SPINE WITHOUT AND WITH CONTRAST TECHNIQUE: Multiplanar and  multiecho pulse sequences of the lumbar spine were obtained without and with intravenous contrast. CONTRAST:  46m MULTIHANCE GADOBENATE DIMEGLUMINE 529 MG/ML IV SOLN COMPARISON:  05/17/2017 FINDINGS: Segmentation:  Standard. Alignment:  Physiologic. Vertebrae: Schmorl's node along the superior endplate of L1. Mild chronic anterior L1 vertebral body height loss. Severe edema and enhancement throughout L2 and L3 vertebral bodies with severe progressive disc height loss and endplate irregularity most consistent with discitis-osteomyelitis. Mild epidural soft tissue enhancement along the posterior margin of the L2 and L3 vertebral bodies likely reflecting  phlegmonous changes. Epidural enhancement circumferentially at the level of L2-3 likely postinflammatory. Small 7 x 5 mm cystic structure along the left paracentral posterior aspect of L2-3 disc with peripheral enhancement which may reflect a disc protrusion versus a small epidural. Soft tissue edema in the paraspinal soft tissues with enhancement consistent with phlegmonous changes without a drainable fluid collection. Conus medullaris and cauda equina: Conus extends to the L1 level. Conus and cauda equina appear normal. Paraspinal and other soft tissues: No other paraspinal abnormalities. Disc levels: Disc spaces: Degenerative disc disease with disc height loss at T12-L1 and L2-3. T12-L1: No significant disc bulge. No evidence of neural foraminal stenosis. No central canal stenosis. L1-L2: Mild broad-based disc bulge. No foraminal or central canal stenosis. No evidence of neural foraminal stenosis. No central canal stenosis. L2-L3: Small 7 x 5 mm cystic structure along the left paracentral posterior aspect of L2-3 disc with peripheral enhancement which may reflect a disc protrusion versus a small epidural. Mild bilateral facet arthropathy. No evidence of neural foraminal stenosis. No central canal stenosis. L3-L4: No significant disc bulge. No evidence of neural  foraminal stenosis. No central canal stenosis. L4-L5: No significant disc bulge. No evidence of neural foraminal stenosis. No central canal stenosis. L5-S1: No significant disc bulge. No evidence of neural foraminal stenosis. No central canal stenosis. IMPRESSION: 1. Severe progressive discitis-osteomyelitis at L2-3 with epidural phlegmonous changes along the posterior margin of the L2 and L3 vertebral bodies. Circumferential epidural enhancement which is likely inflammatory and similar in appearance to the prior exam. Severe paraspinal inflammatory changes without a drainable fluid collection most consistent with a phlegmon which has increased compared with the prior exam. Small 7 x 5 mm cystic structure along the left paracentral posterior aspect of L2-3 disc with peripheral enhancement which may reflect a new disc protrusion versus more likely a small epidural. Electronically Signed   By: Kathreen Devoid   On: 06/03/2017 14:11   Ir Lumbar Disc Aspiration W/img Guide  Result Date: 06/09/2017 INDICATION: L2-L3 discitis and osteomyelitis. EXAM: FLUOROSCOPIC GUIDED DISC ASPIRATION AT L2-L3 MEDICATIONS: The patient is currently admitted to the hospital and receiving intravenous antibiotics. The antibiotics were administered within an appropriate time frame prior to the initiation of the procedure. ANESTHESIA/SEDATION: General anesthesia as per the Department of Anesthesiology at [place]: None immediate. PROCEDURE: Informed written consent was obtained from the patient after a thorough discussion of the procedural risks, benefits and alternatives. All questions were addressed. The patient was then placed under general anesthesia by the Department of Anesthesiology and placed in a prone position. Maximal Sterile Barrier Technique was utilized including caps, mask, sterile gowns, sterile gloves, sterile drape, hand hygiene and skin antiseptic. A timeout was performed prior to the initiation of  the procedure. The skin overlying the L2-L3 region was then prepped and draped in the usual sterile fashion. Using biplane intermittent fluoroscopy, after having identified the L2-L3 disc space, using a right posterolateral approach, a 21 gauge Franseen needle was then advanced into the L2-L3 disc space on crossing the midline. Using a tight 20 mL syringe, thick bloody aspirate was obtained. Similarly, a left posterolateral approach was then utilized at the L2-L3 disc space again with a 21 gauge Franseen needle which was advanced passed midline. Using a 20 mL syringe, thick blood-tinged aspirate was obtained. In both passes, a total of approximately 1.5 mL of thick bloody aspirate was obtained and sent for microbiologic analysis. The needles were then removed. Hemostasis was then achieved at  the skin entry points. The patient's general anesthesia was then reversed. The patient was extubated without difficulty. Upon recovery, the patient was able to move both his legs equally, and was alert, awake and oriented. He was then sent to recovery and thereafter to his floor in a stable condition. IMPRESSION: Status post fluoroscopic guided disc aspiration at L2-L3 for discitis/osteomyelitis as described above. Electronically Signed   By: Luanne Bras M.D.   On: 06/08/2017 11:38      Subjective: Patient was interviewed and examined along with his RN and charge nurse in the room.  Indicates that his back pain is reasonably controlled.  Ambulating to bathroom with walker without difficulty.  No fever, chills, dizziness, lightheadedness, nausea or vomiting.  Tolerating diet.  Having BMs.  No muscle spasms reported.  Asks that we discharge him on OxyIR because he is concerned of abusing Dilaudid if prescribed on Dilaudid.  Denies any other complaints.  As per nursing, no acute issues reported.  Discharge Exam:  Vitals:   06/21/17 2003 06/22/17 2100 06/23/17 0603 06/23/17 1300  BP: (!) 126/92 127/90 128/67  131/77  Pulse: 97 88 96 (!) 110  Resp:    18  Temp: 98.8 F (37.1 C) 98.8 F (37.1 C) 98.1 F (36.7 C) 98.6 F (37 C)  TempSrc:  Oral  Oral  SpO2: 100% 100%  98%  Weight:      Height:       General exam: Pleasant young male, moderately built and nourished, lying comfortably supine in bed. Respiratory system: Clear to auscultation.  No increased work of breathing. Cardiovascular system: S1 and S2 heard, RRR.  No JVD, murmurs or pedal edema. Gastrointestinal system: Abdomen is nondistended, soft and nontender.  Normal bowel sounds heard. Central nervous system: Alert and oriented x3.  No focal neurological deficits. Extremities: Symmetric 5 x 5 power. Musculoskeletal system:  Unremarkable exam today. Skin: No rashes.       The results of significant diagnostics from this hospitalization (including imaging, microbiology, ancillary and laboratory) are listed below for reference.     Microbiology: No results found for this or any previous visit (from the past 240 hour(s)).   Labs: CBC: Last CBC from 06/16/2017 were unremarkable.  Hemoglobin 12.9.  Basic Metabolic Panel: Recent Labs  Lab 06/18/17 0333  NA 136  K 4.1  CL 101  CO2 27  GLUCOSE 218*  BUN 15  CREATININE 0.81  CALCIUM 9.4    CBG: Recent Labs  Lab 06/21/17 2247 06/22/17 0617 06/22/17 1214 06/22/17 1649 06/22/17 2122  GLUCAP 172* 207* 161* 194* 195*   Urinalysis    Component Value Date/Time   COLORURINE STRAW (A) 06/03/2017 1220   APPEARANCEUR CLEAR 06/03/2017 1220   APPEARANCEUR Hazy 09/28/2013 1707   LABSPEC 1.004 (L) 06/03/2017 1220   LABSPEC 1.017 09/28/2013 1707   PHURINE 6.0 06/03/2017 1220   GLUCOSEU NEGATIVE 06/03/2017 1220   GLUCOSEU Negative 09/28/2013 1707   HGBUR NEGATIVE 06/03/2017 [place] 06/03/2017 1220   BILIRUBINUR Negative 09/28/2013 1707   KETONESUR NEGATIVE 06/03/2017 1220   PROTEINUR NEGATIVE 06/03/2017 1220   UROBILINOGEN 0.2 03/24/2014 1341    NITRITE NEGATIVE 06/03/2017 1220   LEUKOCYTESUR NEGATIVE 06/03/2017 1220   LEUKOCYTESUR Negative 09/28/2013 1707   Discussed multiple times and in detail with patient's RN, charge nurse, case management.  Discussed with ID and Dr. Heber Ali Molina.   Time coordinating discharge: 50 minutes  SIGNED:  Vernell Leep, MD, FACP, [facility]. [facility] Pager [phone]  If 7PM-7AM, please contact night-coverage [email or web address] Password [email or web address] 06/23/2017, 2:21 PM

## 2017-06-29 ENCOUNTER — Inpatient Hospital Stay: Payer: Self-pay | Admitting: Internal Medicine

## 2017-06-30 ENCOUNTER — Emergency Department (HOSPITAL_COMMUNITY)
Admission: EM | Admit: 2017-06-30 | Discharge: 2017-06-30 | Disposition: A | Discharge status: Home / Self Care | Payer: Self-pay | Attending: Emergency Medicine | Admitting: Emergency Medicine

## 2017-06-30 ENCOUNTER — Other Ambulatory Visit: Payer: Self-pay

## 2017-06-30 ENCOUNTER — Encounter (HOSPITAL_COMMUNITY): Payer: Self-pay

## 2017-06-30 DIAGNOSIS — F17290 Nicotine dependence, other tobacco product, uncomplicated: Secondary | ICD-10-CM | POA: Insufficient documentation

## 2017-06-30 DIAGNOSIS — R52 Pain, unspecified: Secondary | ICD-10-CM

## 2017-06-30 DIAGNOSIS — Z9119 Patient's noncompliance with other medical treatment and regimen: Secondary | ICD-10-CM | POA: Insufficient documentation

## 2017-06-30 DIAGNOSIS — M545 Low back pain: Secondary | ICD-10-CM | POA: Insufficient documentation

## 2017-06-30 MED ORDER — HYDROMORPHONE HCL 1 MG/ML IJ SOLN
1.0000 mg | Freq: Once | INTRAMUSCULAR | Status: AC
  Administered 2017-06-30: 1 mg via INTRAMUSCULAR
  Filled 2017-06-30: qty 1

## 2017-06-30 NOTE — ED Provider Notes (Signed)
[facility] EMERGENCY DEPARTMENT Provider Note   CSN: [id number] Arrival date & time: 06/30/17  1939     History   Chief Complaint Chief Complaint  Patient presents with  . Back Pain    HPI Joshua Thomas is a 42 y.o. male.  HPI  Patient presents with ongoing back pain. Patient knowledge his multiple medical issues, most significantly recent hospitalization for discitis/osteomyelitis Patient was discharged last month with ongoing oral antibiotics, Suboxone for pain control. He notes that his last Suboxone dose was yesterday, and that he is concerned he missed appointments with both his infectious disease physician and his pain management physician, as he states that he lost his paperwork. He notes that he is actually improving, even since discharge, with increased ability to move his arms without substantial pain, but with ongoing sharp severe pain in his back with ambulation, or motion. Pain is seemingly worse now that he ran out of his medication. No new fever, vomiting, syncope, other complaints. He is here with his mother who assists with the HPI.   Past Medical History:  Diagnosis Date  . ADHD (attention deficit hyperactivity disorder)   . Bipolar 1 disorder ([place])   . Chronic back pain   . Chronic pain    chronic l leg pain  . DDD (degenerative disc disease), lumbar   . Degenerative disc disease, lumbar   . Depression   . GERD (gastroesophageal reflux disease)   . Hepatitis C   . Hepatitis C   . History of stomach ulcers   . Sciatica   . Substance abuse [facility])     Patient Active Problem List   Diagnosis Date Noted  . Osteomyelitis ([place]) 06/04/2017  . Hyperglycemia 06/03/2017  . Discitis   . Streptococcal bacteremia   . GERD (gastroesophageal reflux disease) 05/17/2017  . Bipolar disorder ([place]) 05/17/2017  . Acute osteomyelitis of lumbar spine ([place]) 05/17/2017  . Cocaine abuse with cocaine-induced mood disorder ([place]) 01/20/2017  . Benzodiazepine abuse  ([place]) 01/20/2017  . Cocaine use disorder, moderate, dependence ([place]) 05/23/2015  . Opioid use disorder, moderate, dependence ([place]) 05/23/2015  . Benzodiazepine abuse in remission ([place]) 05/23/2015  . Substance or medication-induced bipolar and related disorder with onset during intoxication ([place]) 05/23/2015  . Chronic hepatitis C virus genotype 1a infection ([place])   . 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 FRACTURE SURGERY    . HARDWARE REMOVAL  01/27/2012   Procedure: HARDWARE REMOVAL;  Surgeon: Mcarthur Rossetti, MD;  Location: [facility];  Service: Orthopedics;  Laterality: Left;  . IR LUMBAR [place] W/IMG GUIDE  06/08/2017  . RADIOLOGY WITH ANESTHESIA N/A 06/08/2017   Procedure: DISC ASPIRATION;  Surgeon: Luanne Bras, MD;  Location: [place];  Service: Radiology;  Laterality: N/A;  . TIBIA IM NAIL INSERTION  01/27/2012   Procedure: INTRAMEDULLARY (IM) NAIL TIBIAL;  Surgeon: Mcarthur Rossetti, MD;  [facility];  Service: Orthopedics;  Laterality: Left;  Removal of IM Rod and Screws Left Tibia with Exchange Nail, Allograft Bone Graft Left Tibia        Home Medications    Prior to Admission medications   Medication Sig Start Date End Date Taking? Authorizing Provider  buprenorphine-naloxone (SUBOXONE) 8-2 mg SUBL SL tablet Place 1 tablet under the tongue daily for 7 days. Patient taking differently: Place 1 tablet under the tongue 2 (two) times daily.  06/24/17 07/01/17 Yes Lucious Groves,  DO  cyclobenzaprine (FLEXERIL) 5 MG tablet Take 1 tablet (5 mg total) by mouth 3 (three) times daily as needed for muscle spasms. 06/23/17  Yes Hongalgi, Lenis Dickinson, MD  gabapentin (NEURONTIN) 300 MG capsule Take 1 capsule (300 mg total) by mouth 3 (three) times daily. 06/23/17 07/23/17 Yes Hongalgi, Lenis Dickinson, MD  levofloxacin (LEVAQUIN) 750 MG tablet Take 1 tablet (750 mg total) by mouth daily. 06/23/17  07/23/17 Yes Hongalgi, Lenis Dickinson, MD  lidocaine (LIDODERM) 5 % Place 1 patch onto the skin daily. Remove & Discard patch within 12 hours or as directed by MD. Apply to mid lower back. 06/24/17  Yes Hongalgi, Lenis Dickinson, MD  QUEtiapine (SEROQUEL) 50 MG tablet Take 1 tablet (50 mg total) by mouth at bedtime. 06/23/17  Yes Hongalgi, Lenis Dickinson, MD  amLODipine (NORVASC) 5 MG tablet Take 1 tablet (5 mg total) by mouth daily. Patient not taking: Reported on 06/30/2017 06/24/17   Modena Jansky, MD  blood glucose meter kit and supplies KIT Dispense based on patient and insurance preference. Use up to four times daily as directed. (FOR ICD-9 250.00, 250.01). 06/23/17   Hongalgi, Lenis Dickinson, MD  glipiZIDE (GLUCOTROL) 5 MG tablet Take 1 tablet (5 mg total) by mouth daily before breakfast. Patient not taking: Reported on 06/30/2017 06/24/17   Modena Jansky, MD  nicotine (NICODERM CQ - DOSED IN MG/24 HOURS) 21 mg/24hr patch Place 1 patch (21 mg total) onto the skin daily. Patient not taking: Reported on 06/30/2017 06/24/17   Modena Jansky, MD  Oxycodone HCl 10 MG TABS Take 1 tablet (10 mg total) by mouth every 6 (six) hours as needed (back pain). Patient not taking: Reported on 06/30/2017 06/23/17   Modena Jansky, MD  pantoprazole (PROTONIX) 40 MG tablet Take 1 tablet (40 mg total) by mouth daily. Patient not taking: Reported on 06/30/2017 06/23/17 07/23/17  Modena Jansky, MD  polyethylene glycol (MIRALAX / GLYCOLAX) packet Take 17 g by mouth 2 (two) times daily. Patient not taking: Reported on 06/30/2017 06/23/17 07/23/17  Modena Jansky, MD  senna-docusate (SENOKOT-S) 8.6-50 MG tablet Take 1 tablet by mouth at bedtime as needed for mild constipation. Patient not taking: Reported on 06/30/2017 06/23/17   Modena Jansky, MD    Family History Family History  Problem Relation Age of Onset  . Diabetes Mother     Social History Social History   Tobacco Use  . Smoking status: Current Every Day Smoker     Years: 24.00    Types: E-cigarettes  . Smokeless tobacco: Former Systems developer    Quit date: 08/08/2006  Substance Use Topics  . Alcohol use: Yes    Comment: occasionally  . Drug use: Yes    Types: Cocaine, IV    Comment: last used 2 months ago     Allergies   Patient has no known allergies.   Review of Systems Review of Systems  Constitutional:       Per HPI, otherwise negative  HENT:       Per HPI, otherwise negative  Respiratory:       Per HPI, otherwise negative  Cardiovascular:       Per HPI, otherwise negative  Gastrointestinal: Negative for vomiting.  Endocrine:       Negative aside from HPI  Genitourinary:       Neg aside from HPI   Musculoskeletal:       Per HPI, otherwise negative  Skin: Negative.   Neurological: Negative for  syncope.     Physical Exam Updated Vital Signs BP 120/80 (BP Location: Right Arm)   Pulse (!) 125   Temp 98.9 F (37.2 C) (Oral)   Resp 18   Ht 5' 3"  (1.6 m)   Wt 72.6 kg (160 lb)   SpO2 100%   BMI 28.34 kg/m   Physical Exam  Constitutional: He is oriented to person, place, and time. He appears well-developed. No distress.  Patient calm, comfortable appearing, no distress, speaking clearly, resting the bed, stating that he feels comfortable while in a resting position.  HENT:  Head: Normocephalic and atraumatic.  Eyes: Conjunctivae and EOM are normal.  Cardiovascular: Normal rate and regular rhythm.  Pulmonary/Chest: Effort normal. No stridor. No respiratory distress.  Abdominal: He exhibits no distension.  Musculoskeletal: He exhibits no deformity.  Neurological: He is alert and oriented to person, place, and time. Coordination normal.  Skin: Skin is warm and dry.  Psychiatric: He has a normal mood and affect.  Nursing note and vitals reviewed.    ED Treatments / Results  After the initial evaluation I reviewed the patient's chart, notable for recent hospitalization for osteomyelitis, discitis, interventional radiology  procedure for drainage, initiation of IV antibiotics, transition to oral antibiotics, and multiple notations for patient's pain medication requests, requirements. Noted on discharge the patient had opiate use disorder, and was transitioning to attempt Suboxone therapy.   After the initial evaluation he should start, with him in the room, we discussed the need to continue his relationship with his pain management physician, Dr. Heber [place], and to reschedule both appointments with him and with infectious disease which he missed in the past days. Patient's mother is aware of this importance as well.   Procedures Procedures (including critical care time)  Medications Ordered in ED Medications  HYDROmorphone (DILAUDID) injection 1 mg (1 mg Intramuscular Given 06/30/17 2111)     Initial Impression / Assessment and Plan / ED Course  I have reviewed the triage vital signs and the nursing notes.  Pertinent labs & imaging results that were available during my care of the patient were reviewed by me and considered in my medical decision making (see chart for details).  Patient with multiple medical issues, recent osteomyelitis, discitis of his back presents with ongoing pain, but is in no distress, awake, alert, moving all extremities spontaneously. He is afebrile, only minimally tachycardic. Patient has a well-documented history of pain medication seeking behavior, and tonight presents due to reportedly missing his appointments.  Here, no evidence for progression of his disease, as he is afebrile, moving everything spontaneously, awake and alert. He does acknowledge taking his antibiotics, this is likely contributing to his improvement. He requested several times for additional analgesia, beyond the intramuscular shot he received here. He was encouraged to follow-up tomorrow with his physicians for appropriate ongoing management of his pain. Absent evidence for progression of his disease, decompensated  infectious state, he was discharged in stable condition.  Final Clinical Impressions(s) / ED Diagnoses   Final diagnoses:  Pain     Carmin Muskrat, MD 06/30/17 2122

## 2017-06-30 NOTE — Discharge Instructions (Addendum)
As discussed, your evaluation today has been largely reassuring.  But, it is important that you monitor your condition carefully, and do not hesitate to return to the ED if you develop new, or concerning changes in your condition. ? ?Otherwise, please follow-up with your physician for appropriate ongoing care. ? ?

## 2017-06-30 NOTE — ED Triage Notes (Signed)
[facility] for [facility]omyelitis of the spine. Pt currently taking anabiotics, flexeril, oxycodone 10 mg. Pt reports pain is getting worse. Pt is out of pain medicine for a couple of days.

## 2017-07-06 ENCOUNTER — Telehealth: Payer: Self-pay | Admitting: General Practice

## 2017-07-06 NOTE — Telephone Encounter (Signed)
Please schedule pt an appt . Thanks 

## 2017-07-06 NOTE — Telephone Encounter (Signed)
Called pt - not at home. I will send pt's request to Dr Mikey Bussinghoffman.

## 2017-07-06 NOTE — Telephone Encounter (Signed)
I would like him to be seen, [facility] Thursday or Friday, Dr Criselda PeachesMullen and I will be attending

## 2017-07-06 NOTE — Telephone Encounter (Signed)
[phone] or [phone] pls call patient, patient is trying to get a refill on his medicine, and make him appt for Tuesday.  If he can get a short supply please send to Outpatient pharmacy

## 2017-07-06 NOTE — Telephone Encounter (Signed)
Wants refill on pain medication and appointment for next available Tuesday

## 2017-07-07 ENCOUNTER — Encounter (HOSPITAL_COMMUNITY): Payer: Self-pay | Admitting: Emergency Medicine

## 2017-07-07 ENCOUNTER — Emergency Department (HOSPITAL_COMMUNITY)
Admission: EM | Admit: 2017-07-07 | Discharge: 2017-07-07 | Disposition: A | Discharge status: Home / Self Care | Payer: Self-pay | Attending: Emergency Medicine | Admitting: Emergency Medicine

## 2017-07-07 ENCOUNTER — Other Ambulatory Visit: Payer: Self-pay

## 2017-07-07 DIAGNOSIS — F17290 Nicotine dependence, other tobacco product, uncomplicated: Secondary | ICD-10-CM | POA: Insufficient documentation

## 2017-07-07 DIAGNOSIS — Z79899 Other long term (current) drug therapy: Secondary | ICD-10-CM | POA: Insufficient documentation

## 2017-07-07 DIAGNOSIS — G8929 Other chronic pain: Secondary | ICD-10-CM | POA: Insufficient documentation

## 2017-07-07 DIAGNOSIS — M545 Low back pain: Secondary | ICD-10-CM | POA: Insufficient documentation

## 2017-07-07 LAB — COMPREHENSIVE METABOLIC PANEL
ALT: 64 U/L — ABNORMAL HIGH (ref 0–44)
AST: 37 U/L (ref 15–41)
Albumin: 3.6 g/dL (ref 3.5–5.0)
Alkaline Phosphatase: 54 U/L (ref 38–126)
Anion gap: 6 (ref 5–15)
BUN: 11 mg/dL (ref 6–20)
CO2: 27 mmol/L (ref 22–32)
Calcium: 9 mg/dL (ref 8.9–10.3)
Chloride: 105 mmol/L (ref 98–111)
Creatinine, Ser: 0.83 mg/dL (ref 0.61–1.24)
GFR calc Af Amer: >60 mL/min (ref >60)
GFR calc non Af Amer: >60 mL/min (ref >60)
Glucose, Bld: 128 mg/dL — ABNORMAL HIGH (ref 70–99)
Potassium: 3.5 mmol/L (ref 3.5–5.1)
Sodium: 138 mmol/L (ref 135–145)
Total Bilirubin: 0.6 mg/dL (ref 0.3–1.2)
Total Protein: 7.5 g/dL (ref 6.5–8.1)

## 2017-07-07 LAB — SEDIMENTATION RATE: Sed Rate: 26 mm/hr — ABNORMAL HIGH (ref 0–16)

## 2017-07-07 LAB — C-REACTIVE PROTEIN: CRP: 0.8 mg/dL (ref <1.0)

## 2017-07-07 MED ORDER — LORAZEPAM 1 MG PO TABS
1.0000 mg | ORAL_TABLET | Freq: Once | ORAL | Status: AC
  Administered 2017-07-07: 1 mg via ORAL
  Filled 2017-07-07: qty 1

## 2017-07-07 MED ORDER — KETOROLAC TROMETHAMINE 30 MG/ML IJ SOLN
15.0000 mg | Freq: Once | INTRAMUSCULAR | Status: AC
  Administered 2017-07-07: 15 mg via INTRAMUSCULAR
  Filled 2017-07-07: qty 1

## 2017-07-07 MED ORDER — HYDROMORPHONE HCL 1 MG/ML IJ SOLN
1.0000 mg | Freq: Once | INTRAMUSCULAR | Status: AC
  Administered 2017-07-07: 1 mg via INTRAMUSCULAR
  Filled 2017-07-07: qty 1

## 2017-07-07 NOTE — Discharge Instructions (Addendum)
Your blood work today continues to show signs of improvement which is very encouraging. Your condition unfortunately is not one that improves quickly. Your recovery will be measured in months, not days or even weeks. Keep taking your antibiotics. You need to make an appointment to see infectious disease as soon as you can and also make sure you keep your appointment with Dr Mikey BussingHoffman on Tuesday.

## 2017-07-07 NOTE — Telephone Encounter (Signed)
Patient currently in [facility] ED. Kinnie FeilL. Nathanie Ottley, RN, BSN

## 2017-07-07 NOTE — ED Provider Notes (Signed)
[facility] EMERGENCY DEPARTMENT Provider Note   CSN: [id number] Arrival date & time: 07/07/17  1511     History   Chief Complaint Chief Complaint  Patient presents with  . Back Pain    HPI Joshua Thomas is a 42 y.o. male.  HPI   42 year old male with lower back pain.  Chronic in nature.  Recent admission for lumbar discitis.  He was discharged on Levaquin which he reports compliance with.  He has had continued pain which overall has been improving up until today.  Some days have been better than others but it has been sniffily worse today.  He does say he was walking around doing some light activity picking up in the yard.  Denies any acute trauma.  He reports some chronic numbness in the little toe of his right foot but denies any acute neurological complaints.  No fevers or chills.  He was in the emergency 1 week ago also for back pain.  He has yet to follow-up with pain management or infectious disease since discharge.  Past Medical History:  Diagnosis Date  . ADHD (attention deficit hyperactivity disorder)   . Bipolar 1 disorder ([place])   . Chronic back pain   . Chronic pain    chronic l leg pain  . DDD (degenerative disc disease), lumbar   . Degenerative disc disease, lumbar   . Depression   . GERD (gastroesophageal reflux disease)   . Hepatitis C   . Hepatitis C   . History of stomach ulcers   . Sciatica   . Substance abuse [facility])     Patient Active Problem List   Diagnosis Date Noted  . Osteomyelitis ([place]) 06/04/2017  . Hyperglycemia 06/03/2017  . Discitis   . Streptococcal bacteremia   . GERD (gastroesophageal reflux disease) 05/17/2017  . Bipolar disorder ([place]) 05/17/2017  . Acute osteomyelitis of lumbar spine ([place] 05/17/2017  . Cocaine abuse with cocaine-induced mood disorder ([place]) 01/20/2017  . Benzodiazepine abuse ([place]) 01/20/2017  . Cocaine use disorder, moderate, dependence ([place]) 05/23/2015  . Opioid use disorder, moderate, dependence ([place])  05/23/2015  . Benzodiazepine abuse in remission ([place]) 05/23/2015  . 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 Chronic hepatitis C virus genotype 1a infection ([place])   . 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 FRACTURE SURGERY    . HARDWARE REMOVAL  01/27/2012   Procedure: HARDWARE REMOVAL;  Surgeon: Mcarthur Rossetti, MD;  [facility];  Service: Orthopedics;  Laterality: Left;  . IR LUMBAR [place] W/IMG GUIDE  06/08/2017  . RADIOLOGY WITH ANESTHESIA N/A 06/08/2017   Procedure: DISC ASPIRATION;  Surgeon: Luanne Bras, MD;  Location: [place];  Service: Radiology;  Laterality: N/A;  . TIBIA IM NAIL INSERTION  01/27/2012   Procedure: INTRAMEDULLARY (IM) NAIL TIBIAL;  Surgeon: Mcarthur Rossetti, MD;  [facility];  Service: Orthopedics;  Laterality: Left;  Removal of IM Rod and Screws Left Tibia with Exchange Nail, Allograft Bone Graft Left Tibia        Home Medications    Prior to Admission medications   Medication Sig Start Date End Date Taking? Authorizing Provider  amLODipine (NORVASC) 5 MG tablet Take 1 tablet (5 mg total) by mouth daily. Patient not taking: Reported on 06/30/2017 06/24/17   Modena Jansky, MD  blood glucose meter kit and supplies KIT Dispense based  on patient and insurance preference. Use up to four times daily as directed. (FOR ICD-9 250.00, 250.01). 06/23/17   Hongalgi, Lenis Dickinson, MD  cyclobenzaprine (FLEXERIL) 5 MG tablet Take 1 tablet (5 mg total) by mouth 3 (three) times daily as needed for muscle spasms. 06/23/17   Hongalgi, Lenis Dickinson, MD  gabapentin (NEURONTIN) 300 MG capsule Take 1 capsule (300 mg total) by mouth 3 (three) times daily. 06/23/17 07/23/17  Hongalgi, Lenis Dickinson, MD  glipiZIDE (GLUCOTROL) 5 MG tablet Take 1 tablet (5 mg total) by mouth daily before breakfast. Patient not taking:  Reported on 06/30/2017 06/24/17   Modena Jansky, MD  levofloxacin (LEVAQUIN) 750 MG tablet Take 1 tablet (750 mg total) by mouth daily. 06/23/17 07/23/17  Hongalgi, Lenis Dickinson, MD  lidocaine (LIDODERM) 5 % Place 1 patch onto the skin daily. Remove & Discard patch within 12 hours or as directed by MD. Apply to mid lower back. 06/24/17   Hongalgi, Lenis Dickinson, MD  nicotine (NICODERM CQ - DOSED IN MG/24 HOURS) 21 mg/24hr patch Place 1 patch (21 mg total) onto the skin daily. Patient not taking: Reported on 06/30/2017 06/24/17   Modena Jansky, MD  Oxycodone HCl 10 MG TABS Take 1 tablet (10 mg total) by mouth every 6 (six) hours as needed (back pain). Patient not taking: Reported on 06/30/2017 06/23/17   Modena Jansky, MD  pantoprazole (PROTONIX) 40 MG tablet Take 1 tablet (40 mg total) by mouth daily. Patient not taking: Reported on 06/30/2017 06/23/17 07/23/17  Modena Jansky, MD  polyethylene glycol (MIRALAX / GLYCOLAX) packet Take 17 g by mouth 2 (two) times daily. Patient not taking: Reported on 06/30/2017 06/23/17 07/23/17  Modena Jansky, MD  QUEtiapine (SEROQUEL) 50 MG tablet Take 1 tablet (50 mg total) by mouth at bedtime. 06/23/17   Hongalgi, Lenis Dickinson, MD  senna-docusate (SENOKOT-S) 8.6-50 MG tablet Take 1 tablet by mouth at bedtime as needed for mild constipation. Patient not taking: Reported on 06/30/2017 06/23/17   Modena Jansky, MD    Family History Family History  Problem Relation Age of Onset  . Diabetes Mother     Social History Social History   Tobacco Use  . Smoking status: Current Every Day Smoker    Years: 24.00    Types: E-cigarettes  . Smokeless tobacco: Former Systems developer    Quit date: 08/08/2006  Substance Use Topics  . Alcohol use: Yes    Comment: occasionally  . Drug use: Yes    Types: Cocaine, IV    Comment: last used 2 months ago     Allergies   Patient has no known allergies.   Review of Systems Review of Systems  All systems reviewed and negative, other  than as noted in HPI.  Physical Exam Updated Vital Signs BP (!) 154/104 (BP Location: Right Arm)   Pulse (!) 103   Temp 98.7 F (37.1 C) (Oral)   Resp 18   Ht _0  (1.6 m)   Wt 72.6 kg (160 lb)   SpO2 100%   BMI 28.34 kg/m   Physical Exam  Constitutional: He appears well-developed and well-nourished. No distress.  HENT:  Head: Normocephalic and atraumatic.  Eyes: Conjunctivae are normal. Right eye exhibits no discharge. Left eye exhibits no discharge.  Neck: Neck supple.  Cardiovascular: Regular rhythm and normal heart sounds. Exam reveals no gallop and no friction rub.  No murmur heard. Mild tachycardia  Pulmonary/Chest: Effort normal and breath sounds normal. No respiratory distress.  Abdominal: Soft. He exhibits no distension. There is no tenderness.  Musculoskeletal: He exhibits no edema or tenderness.  Pt sitting in wheelchair when I entered the room. He insisted on getting onto stretcher himself without assistance. He was able to to this although appeared to be very uncomfortable. Back normal to inspection. Mid/lower lumbar TTP. Strength 5/5 b/l LE.   Neurological: He is alert.  Skin: Skin is warm and dry.  Psychiatric: He has a normal mood and affect. His behavior is normal. Thought content normal.  Nursing note and vitals reviewed.    ED Treatments / Results  Labs (all labs ordered are listed, but only abnormal results are displayed) Labs Reviewed - No data to display  EKG None  Radiology No results found.  Procedures Procedures (including critical care time)  Medications Ordered in ED Medications - No data to display   Initial Impression / Assessment and Plan / ED Course  I have reviewed the triage vital signs and the nursing notes.  Pertinent labs & imaging results that were available during my care of the patient were reviewed by me and considered in my medical decision making (see chart for details).     42yM with continued lower back pain.  Recent admit for discitis.  He is very frustrated with his continued severe pain. He reports walking around and picking up yesterday. He may have simply over exerted himself. Explained that the expected timeline of his recovery is months and not days/weeks. He has no acute neuro complaints. Afebrile. His symptoms and exam today are not particularly worrisome for significant disease progression.  I spent quite a bit of time trying to reassure him and also stressed importance of following up as recommended at discharge. He apparently has rescheduled appointment with Dr Heber Roxie for this upcoming Tuesday. Advised he needs to make an appointment with ID as well. He reports compliance with his antibiotics.   Will check ESR/CRP and CMP today as it doesn't appear he has had any labs done in several weeks. Dose of pain medication given in ED. Explained further management of pain needs to be through Dr Heber [place]. My impression today though is that he is largely looking for reassurance and not medication seeking.   Final Clinical Impressions(s) / ED Diagnoses   Final diagnoses:  Chronic low back pain without sciatica, unspecified back pain laterality    ED Discharge Orders    None       Virgel Manifold, MD 07/10/17 0004

## 2017-07-07 NOTE — ED Triage Notes (Signed)
Pt c/o of chronic back pain worsening last week.  Pt starts he is unable to ambulation

## 2017-07-08 NOTE — Telephone Encounter (Signed)
It looks like he may have been discharged now from the ED, could we see about getting him an appointment (unfortuantely the next OUD clinic is the 9th) so [facility].

## 2017-07-08 NOTE — Telephone Encounter (Signed)
Line went immediately to recording, "Your call cannot be completed at this time." Unable to leave VM. Kinnie FeilL. Ruffin Lada, RN, BSN

## 2017-07-12 ENCOUNTER — Ambulatory Visit (INDEPENDENT_AMBULATORY_CARE_PROVIDER_SITE_OTHER): Payer: Self-pay | Admitting: Internal Medicine

## 2017-07-12 VITALS — BP 131/89 | HR 97 | Temp 98.2°F | Wt 163.2 lb

## 2017-07-12 DIAGNOSIS — M4646 Discitis, unspecified, lumbar region: Secondary | ICD-10-CM

## 2017-07-12 DIAGNOSIS — F1410 Cocaine abuse, uncomplicated: Secondary | ICD-10-CM

## 2017-07-12 DIAGNOSIS — F1120 Opioid dependence, uncomplicated: Secondary | ICD-10-CM

## 2017-07-12 DIAGNOSIS — F319 Bipolar disorder, unspecified: Secondary | ICD-10-CM

## 2017-07-12 DIAGNOSIS — F19929 Other psychoactive substance use, unspecified with intoxication, unspecified: Principal | ICD-10-CM

## 2017-07-12 DIAGNOSIS — B1920 Unspecified viral hepatitis C without hepatic coma: Secondary | ICD-10-CM

## 2017-07-12 DIAGNOSIS — F17200 Nicotine dependence, unspecified, uncomplicated: Secondary | ICD-10-CM

## 2017-07-12 DIAGNOSIS — M4626 Osteomyelitis of vertebra, lumbar region: Secondary | ICD-10-CM

## 2017-07-12 DIAGNOSIS — Z8619 Personal history of other infectious and parasitic diseases: Secondary | ICD-10-CM

## 2017-07-12 DIAGNOSIS — F1994 Other psychoactive substance use, unspecified with psychoactive substance-induced mood disorder: Secondary | ICD-10-CM

## 2017-07-12 MED ORDER — BUPRENORPHINE HCL-NALOXONE HCL 8-2 MG SL SUBL: 1.0000 | SUBLINGUAL_TABLET | Freq: Two times a day (BID) | SUBLINGUAL | 0 refills | Status: DC

## 2017-07-12 MED FILL — BUPRENORPHINE HCL-NALOXONE: 8-2 | 7 days supply | Qty: 14 | Fill #0

## 2017-07-12 NOTE — Progress Notes (Signed)
Internal Medicine Clinic Attending  I saw and evaluated the patient.  I personally confirmed the key portions of the history and exam documented by Dr. Delma Officerhundi and I reviewed pertinent patient test results.  The assessment, diagnosis, and plan were formulated together and I agree with the documentation in the resident's note. As noted above, patient missed his appointments with myself and ID (Dr Luciana Axeomer) after hospital discharge.  He does not some overall improvement in his pain and cravings have been mostly controlled with 8mg  of Suboxone.  He did have 2 ED visits (it seems mainly for reasurrance) he does have follow up scheduled next weeks to establish with PCP.    As discussed I will increase him on Suboxone to 16mg  TDD, I think this will help his cravings as well as improve pain.  He did ask for oxycodone for breakthrough pain. I declined to give this as I think it will likely put him at higher risk of relapse.  I will see him back in 1 week.  We did obtain a [facility] screen today (patinet out of prescribed buprenorphine but reports obtaining buprenorphine)

## 2017-07-12 NOTE — Patient Instructions (Addendum)
It was a pleasure to see you today Ms. Joshua Thomas. Congratulations on continuing to do well! We look forward to supporting you in this process.   -Please follow up with us in one week at [facility] for us to continue to assess how you are doing on suboxone therapy. Please make sure to keep the tablet under your tongue for a few minutes and do not eat anything for 15 minutes afterward -Please follow up with your primary care physician Dr. Lily KocherGomez on 07/19/17 -Please follow up with Dr. Luciana Axeomer (Infectious Disease), his clinic will follow up with you  If you have any questions or concerns, please call our clinic at [phone] between 9am-5pm and after hours call [phone] and ask for the internal medicine resident on call. If you feel you are having a medical emergency please call 911.   Thank you, we look forward to help you remain healthy!  Joshua CourierVahini Satori Krabill, MD Internal Medicine PGY1

## 2017-07-12 NOTE — Assessment & Plan Note (Signed)
The patient is a person with substance abuse history. He started using cocaine at age 42, heroin 5 to 6 years ago, and benzodiazepines.  The patient was utilized from 05/17/2017 to 05/26/2017 for strep viridans bacteremia and osteomyelitis of L2-L3.  He was treated with IV antibiotics and transitioned to oral antibiotics.   The patient was started on Suboxone therapy on 05/31/2017 during which time he was given Suboxone 8-32m twice daily for 1 week and recommended to follow-up.  The patient was subsequently hospitalized again from 06/04/2017 to 06/23/2017 due to acute flare of his lower back pain.  During this time the patient was treated with IV antibiotics, neurosurgery was consulted and did not recommend any surgical management, infectious disease Dr. SBaxter Flatteryrecommended as disc aspiration. Lumbar disc aspiration done on 06/08/2017 did not show any growth of organisms.  He was discharged with oxycodone and 1 week course of Suboxone 8-2 mg #7tablets and told to follow-up in 1 week.    Unfortunately the patient missed follow-up appointment.  He states that he has been taking non-prescribed Suboxone.  He has also been taking benzodiazepines that he been prescribed.  [facility]  ESR and CRP have improved and are 26 and 0.8 respectively.  Assessment and plan The patient's back pain has improved and he reports several functional improvements.  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  He seems to be doing well on Suboxone.  The patient has been able to obtain Suboxone on the streets and has not displayed any withdrawal symptoms.  -Tox assure will be done at this visit.  Expecting to see the back soon and benzodiazepines as mentioned by patient. -Follow-up in [facility] -Follow-up with PCP Dr. GAltamease Oileron 07/19/2017 -Will contact Dr. CLinus Salmons(infectious disease) about patient's antibiotic regimen and will also request for follow-up visit to be made with infectious disease -Increased Suboxone from 8-2 mg once a day to twice a day -Counseled patient on how to use Suboxone effectively and patient to patient that his back pain will slowly improve.  We anticipate that increased dose of Suboxone will help the pain and all he is can also use ibuprofen on an as-needed basis.

## 2017-07-12 NOTE — Telephone Encounter (Signed)
See in clinic today.

## 2017-07-12 NOTE — Progress Notes (Addendum)
CC: Opioid use disorder   HPI:  [name] is a 42 y.o. male with lumbar discitis, bipolar 1 disorder, degenerative disc disease, hepatitis c, tobacco abuse, and polysubstance disorder (IV heroin and cocaine) who presents for follow up regarding opiod use disorder. Please see problem based charting for evaluation, assessment, and plan.  Past Medical History:  Diagnosis Date  . ADHD (attention deficit hyperactivity disorder)   . Bipolar 1 disorder ([place])   . Chronic back pain   . Chronic pain    chronic l leg pain  . DDD (degenerative disc disease), lumbar   . Degenerative disc disease, lumbar   . Depression   . GERD (gastroesophageal reflux disease)   . Hepatitis C   . Hepatitis C   . History of stomach ulcers   . Sciatica   . Substance abuse ([place])    Review of Systems:    Denies any numbness or tingling, nausea. Has abdominal distention, improved mood and energy, has lower back pain.  Physical Exam:  Vitals:   07/12/17 0834  BP: 131/89  Pulse: 97  Temp: 98.2 F (36.8 C)  TempSrc: Oral  SpO2: 100%  Weight: 163 lb 3.2 oz (74 kg)   Physical Exam  Constitutional: He is oriented to person, place, and time. He appears well-developed and well-nourished. No distress.  HENT:  Head: Normocephalic and atraumatic.  Eyes: Conjunctivae are normal.  Cardiovascular: Normal rate, regular rhythm and normal heart sounds.  Respiratory: Breath sounds normal. He has no wheezes.  GI: Soft. Bowel sounds are normal. He exhibits distension. There is no tenderness.  Musculoskeletal: He exhibits no edema.  Limited range of motion, 3/5 strength in bilateral upper extremities  Neurological: He is alert and oriented to person, place, and time.  Skin: He is not diaphoretic. No erythema.  Psychiatric: He has a normal mood and affect. His behavior is normal. Judgment and thought content normal.   Assessment & Plan:   See Encounters Tab for problem based charting.  Opiate use  disorder The patient is a person with substance abuse history. He started using cocaine at age 57, heroin 5 to 6 years ago, and benzodiazepines.  The patient was utilized from 05/17/2017 to 05/26/2017 for strep viridans bacteremia and osteomyelitis of L2-L3.  He was treated with IV antibiotics and transitioned to oral antibiotics.   The patient was started on Suboxone therapy on 05/31/2017 during which time he was given Suboxone 8-15m twice daily for 1 week and recommended to follow-up.  The patient was subsequently hospitalized again from 06/04/2017 to 06/23/2017 due to acute flare of his lower back pain.  During this time the patient was treated with IV antibiotics, neurosurgery was consulted and did not recommend any surgical management, infectious disease Dr. SBaxter Flatteryrecommended as disc aspiration. Lumbar disc aspiration done on 06/08/2017 did not show any growth of organisms.  He was discharged with oxycodone and 1 week course of Suboxone 8-2 mg #7tablets and told to follow-up in 1 week.    Unfortunately the patient missed follow-up appointment.  He states that he has been taking non-prescribed Suboxone.  He has also been taking benzodiazepines that he been prescribed.  [facility].  ESR and CRP have improved and are 26 and 0.8 respectively.  Assessment and plan The patient'Joshua back pain has improved and he reports several functional improvements.  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  He seems to be doing well on Suboxone.  The patient has been able to obtain Suboxone on the streets and has not displayed any withdrawal symptoms.  -Tox assure will be done at this visit.  Expecting to see the back soon and benzodiazepines as mentioned by patient. -Follow-up in [facility] -Follow-up with PCP Dr. Altamease Thomas on 07/19/2017 -Will contact Dr. Linus Thomas (infectious disease) about patient'Joshua antibiotic regimen and will also request for follow-up visit to be made with infectious disease -Increased Suboxone from 8-2 mg once a day to twice a day -Counseled patient on how to use Suboxone effectively and patient to patient that his back pain will slowly improve.  We anticipate that increased dose of Suboxone will help the pain and all he is can also use ibuprofen on an as-needed basis.   Patient seen with Dr. Angelia Mould

## 2017-07-18 LAB — TOXASSURE SELECT,+ANTIDEPR,UR

## 2017-07-19 ENCOUNTER — Ambulatory Visit (INDEPENDENT_AMBULATORY_CARE_PROVIDER_SITE_OTHER): Payer: Self-pay | Admitting: Physician Assistant

## 2017-07-19 ENCOUNTER — Ambulatory Visit (INDEPENDENT_AMBULATORY_CARE_PROVIDER_SITE_OTHER): Payer: Self-pay | Admitting: Internal Medicine

## 2017-07-19 VITALS — BP 123/73 | HR 68 | Temp 98.1°F | Wt 157.5 lb

## 2017-07-19 DIAGNOSIS — F1120 Opioid dependence, uncomplicated: Secondary | ICD-10-CM

## 2017-07-19 DIAGNOSIS — F319 Bipolar disorder, unspecified: Secondary | ICD-10-CM

## 2017-07-19 DIAGNOSIS — M869 Osteomyelitis, unspecified: Secondary | ICD-10-CM

## 2017-07-19 DIAGNOSIS — B182 Chronic viral hepatitis C: Secondary | ICD-10-CM

## 2017-07-19 DIAGNOSIS — F1490 Cocaine use, unspecified, uncomplicated: Secondary | ICD-10-CM

## 2017-07-19 DIAGNOSIS — M4626 Osteomyelitis of vertebra, lumbar region: Secondary | ICD-10-CM

## 2017-07-19 DIAGNOSIS — M4646 Discitis, unspecified, lumbar region: Secondary | ICD-10-CM

## 2017-07-19 DIAGNOSIS — B1920 Unspecified viral hepatitis C without hepatic coma: Secondary | ICD-10-CM

## 2017-07-19 MED ORDER — BUPRENORPHINE HCL-NALOXONE HCL 8-2 MG SL SUBL
1.0000 | SUBLINGUAL_TABLET | Freq: Two times a day (BID) | SUBLINGUAL | 0 refills | Status: AC
Start: 2017-07-19 — End: 2017-07-26

## 2017-07-19 MED FILL — BUPRENORPHINE HCL-NALOXONE: 8-2 | 7 days supply | Qty: 14 | Fill #0

## 2017-07-19 NOTE — Progress Notes (Signed)
   07/19/2017  Joshua Thomas presents for follow up of opioid use disorder I have reviewed the prior induction visit, follow up visits, and telephone encounters relevant to opiate use disorder (OUD) treatment.   Current daily dose: Suboxone 16 mg daily  Date of Induction: 07/12/17  Current follow up interval, in weeks: Weekly  The patient has not been adherent with the buprenorphine for OUD contract.   Last UDS Result: 07/12/17- unexpected for alprazolam and oxymorphone  HPI: [name] is a 42 y.o m with lumbs discitis, bipolar 1 disorder, degenerative disc disease, hepatitis c, polysubstance disorder (IV heroin and cocaine) who presents for opiod use disorder follow up.   The patient states that his pain on 07/14/2017.  He states that during the holidays he was away from his mother's house and spend time with friends used substances which is.  The patient does not state that he has any withdrawal symptoms.  Exam:   Vitals:   07/19/17 0903  BP: 123/73  Pulse: 68  Temp: 98.1 F (36.7 C)  TempSrc: Oral  SpO2: 100%  Weight: 157 lb 8 oz (71.4 kg)   Physical Exam  Constitutional: He appears well-developed and well-nourished. No distress.  HENT:  Head: Normocephalic and atraumatic.  Eyes: Conjunctivae are normal.  Cardiovascular: Normal rate, regular rhythm and normal heart sounds.  Respiratory: Effort normal and breath sounds normal. No respiratory distress. He has no wheezes.  GI: Soft. Bowel sounds are normal. He exhibits no distension. There is no tenderness.  Musculoskeletal: He exhibits no edema.  Neurological: He is alert.  Skin: He is not diaphoretic. No erythema.  Psychiatric: He has a normal mood and affect. His behavior is normal. Judgment and thought content normal.     Assessment/Plan:  See Problem Based Charting in the Encounters Tab     Joshua Thomas, Joshua Carrero, MD  07/19/2017  9:13 AM

## 2017-07-19 NOTE — Patient Instructions (Addendum)
It was a pleasure to see you today Joshua Thomas. Please make the following changes:  -Please continue taking suboxone 16mg   -Please follow up in 1 week -Please contact your primary care physician Dr. Lily KocherGomez and reschedule an appointment -Please call and schedule an appointment with infectious diseases: [phone]  If you have any questions or concerns, please call our clinic at [phone] between 9am-5pm and after hours call [phone] and ask for the internal medicine resident on call. If you feel you are having a medical emergency please call 911.   Thank you, we look forward to help you remain healthy!  Joshua CourierVahini Shirely Toren, MD Internal Medicine PGY1

## 2017-07-19 NOTE — Assessment & Plan Note (Signed)
The patient needs to follow-up with Dr Luciana Axeomer ( infectious disease).  The patient was given contact details for [facility] to establish an appointment.

## 2017-07-19 NOTE — Assessment & Plan Note (Signed)
Patient is currently getting 16 units daily of Suboxone therapy which was increased at last visit.  He states that the new dose is helping with his lumbar pain and also helping with his cravings.  Patient used cocaine on 07/14/2017.  Discussed with the patient about avoiding stressors.  He understood and stated that he would like to continue trying Suboxone therapy.  [facility] and the patient's records seem appropriate for his history and he has not gotten any new controlled substances  -Refilled Suboxone 16 mg daily -Order tox assure -Follow-up in 1 week

## 2017-07-22 NOTE — Progress Notes (Signed)
Internal Medicine Clinic Attending  Case discussed with Dr. Chundi at the time of the visit.  We reviewed the resident's history and exam and pertinent patient test results.  I agree with the assessment, diagnosis, and plan of care documented in the resident's note. 

## 2017-07-25 NOTE — Progress Notes (Deleted)
   07/25/2017  Joshua Thomas presents for follow up of opioid use disorder I have reviewed the prior induction visit, follow up visits, and telephone encounters relevant to opiate use disorder (OUD) treatment.   Current daily dose: Suboxone 16mg  daily  Date of Induction: 07/12/17  Current follow up interval, in weeks: every week  The patient {Has/has not:18111} been adherent with the buprenorphine for OUD contract.   Last UDS Result: ***  HPI: [name] is a 42 y.o m with degenerative disc disease, hepatitis c, lumbar discitis, bipolar 1 disorder, polysubstance use who presents for opiod use disorder follow up.   The patient continues to reside with his mother who has been driving him to his medical appointments.    Exam:   There were no vitals filed for this visit.  ***  Assessment/Plan:  See Problem Based Charting in the Encounters Tab     Joshua Thomas, Joshua Witkop, MD  07/25/2017  8:29 PM

## 2017-07-26 LAB — TOXASSURE SELECT,+ANTIDEPR,UR

## 2017-09-22 ENCOUNTER — Encounter (HOSPITAL_COMMUNITY): Payer: Self-pay | Admitting: Emergency Medicine

## 2017-09-22 ENCOUNTER — Other Ambulatory Visit: Payer: Self-pay

## 2017-09-22 ENCOUNTER — Inpatient Hospital Stay (HOSPITAL_COMMUNITY)
Admission: EM | Admit: 2017-09-22 | Discharge: 2017-09-24 | DRG: 638 | Disposition: A | Discharge status: Home / Self Care | Payer: Self-pay | Attending: Family Medicine | Admitting: Family Medicine

## 2017-09-22 DIAGNOSIS — D696 Thrombocytopenia, unspecified: Secondary | ICD-10-CM | POA: Diagnosis present

## 2017-09-22 DIAGNOSIS — M4626 Osteomyelitis of vertebra, lumbar region: Secondary | ICD-10-CM | POA: Diagnosis present

## 2017-09-22 DIAGNOSIS — M4646 Discitis, unspecified, lumbar region: Secondary | ICD-10-CM | POA: Diagnosis present

## 2017-09-22 DIAGNOSIS — M869 Osteomyelitis, unspecified: Secondary | ICD-10-CM | POA: Diagnosis present

## 2017-09-22 DIAGNOSIS — M545 Low back pain, unspecified: Secondary | ICD-10-CM

## 2017-09-22 DIAGNOSIS — K219 Gastro-esophageal reflux disease without esophagitis: Secondary | ICD-10-CM | POA: Diagnosis present

## 2017-09-22 DIAGNOSIS — B182 Chronic viral hepatitis C: Secondary | ICD-10-CM | POA: Diagnosis present

## 2017-09-22 DIAGNOSIS — E1169 Type 2 diabetes mellitus with other specified complication: Principal | ICD-10-CM | POA: Diagnosis present

## 2017-09-22 DIAGNOSIS — Z9119 Patient's noncompliance with other medical treatment and regimen: Secondary | ICD-10-CM

## 2017-09-22 DIAGNOSIS — I10 Essential (primary) hypertension: Secondary | ICD-10-CM

## 2017-09-22 DIAGNOSIS — F909 Attention-deficit hyperactivity disorder, unspecified type: Secondary | ICD-10-CM | POA: Diagnosis present

## 2017-09-22 DIAGNOSIS — Z7982 Long term (current) use of aspirin: Secondary | ICD-10-CM

## 2017-09-22 DIAGNOSIS — Z833 Family history of diabetes mellitus: Secondary | ICD-10-CM

## 2017-09-22 DIAGNOSIS — F319 Bipolar disorder, unspecified: Secondary | ICD-10-CM | POA: Diagnosis present

## 2017-09-22 DIAGNOSIS — F1120 Opioid dependence, uncomplicated: Secondary | ICD-10-CM | POA: Diagnosis present

## 2017-09-22 DIAGNOSIS — Z7984 Long term (current) use of oral hypoglycemic drugs: Secondary | ICD-10-CM

## 2017-09-22 DIAGNOSIS — F17290 Nicotine dependence, other tobacco product, uncomplicated: Secondary | ICD-10-CM | POA: Diagnosis present

## 2017-09-22 DIAGNOSIS — F332 Major depressive disorder, recurrent severe without psychotic features: Secondary | ICD-10-CM | POA: Diagnosis present

## 2017-09-22 DIAGNOSIS — G8929 Other chronic pain: Secondary | ICD-10-CM

## 2017-09-22 DIAGNOSIS — Z8711 Personal history of peptic ulcer disease: Secondary | ICD-10-CM

## 2017-09-22 DIAGNOSIS — Z79899 Other long term (current) drug therapy: Secondary | ICD-10-CM

## 2017-09-22 LAB — COMPREHENSIVE METABOLIC PANEL
ALT: 62 U/L — AB (ref 0–44)
ANION GAP: 6 (ref 5–15)
AST: 44 U/L — ABNORMAL HIGH (ref 15–41)
Albumin: 3.6 g/dL (ref 3.5–5.0)
Alkaline Phosphatase: 56 U/L (ref 38–126)
BUN: 8 mg/dL (ref 6–20)
CO2: 30 mmol/L (ref 22–32)
CREATININE: 0.93 mg/dL (ref 0.61–1.24)
Calcium: 9.5 mg/dL (ref 8.9–10.3)
Chloride: 105 mmol/L (ref 98–111)
Glucose, Bld: 127 mg/dL — ABNORMAL HIGH (ref 70–99)
Potassium: 4.4 mmol/L (ref 3.5–5.1)
SODIUM: 141 mmol/L (ref 135–145)
TOTAL PROTEIN: 7.8 g/dL (ref 6.5–8.1)
Total Bilirubin: 0.5 mg/dL (ref 0.3–1.2)

## 2017-09-22 LAB — CBC
HCT: 47.8 % (ref 39.0–52.0)
Hemoglobin: 15.4 g/dL (ref 13.0–17.0)
MCH: 30.9 pg (ref 26.0–34.0)
MCHC: 32.2 g/dL (ref 30.0–36.0)
MCV: 95.8 fL (ref 78.0–100.0)
Platelets: 133 10*3/uL — ABNORMAL LOW (ref 150–400)
RBC: 4.99 MIL/uL (ref 4.22–5.81)
RDW: 15.2 % (ref 11.5–15.5)
WBC: 5.1 10*3/uL (ref 4.0–10.5)

## 2017-09-22 MED ORDER — LIDOCAINE HCL (PF) 1 % IJ SOLN
4.0000 mL | Freq: Once | INTRAMUSCULAR | Status: AC
  Administered 2017-09-22: 4 mL via INTRADERMAL

## 2017-09-22 MED ORDER — LIDOCAINE HCL (PF) 1 % IJ SOLN
INTRAMUSCULAR | Status: AC
  Filled 2017-09-22: qty 4

## 2017-09-22 NOTE — ED Provider Notes (Signed)
[facility] EMERGENCY DEPARTMENT Provider Note   CSN: [id number] Arrival date & time: 09/22/17  1937     History   Chief Complaint Chief Complaint  Patient presents with  . Back Pain    HPI Joshua Thomas is a 42 y.o. male.  Patient is a 42 year old male with past medical history of ADHD, bipolar disorder, hepatitis C, and IV drug abuse with history of chronic pain related to discitis of the lumbar spine.  He has been on prolonged IV antibiotics in the past, however has been noncompliant with follow-up with infectious disease.  He presents today stating that the pain in his back is become worse over the past several weeks.  He denies any weakness or bowel or bladder complaints.  He tells me he was in [place] and visited the emergency department there with similar complaints.  He states that he had an MRI performed showing inflammation and they recommended admission.  He refused stating that his daughter was graduating and wanted to attend the ceremony.  He returns here 3 weeks after this ER visit complaining of ongoing pain and believes that his discitis is flaring up again.  The history is provided by the patient.    Past Medical History:  Diagnosis Date  . ADHD (attention deficit hyperactivity disorder)   . Bipolar 1 disorder ([place])   . Chronic back pain   . Chronic pain    chronic l leg pain  . DDD (degenerative disc disease), lumbar   . Degenerative disc disease, lumbar   . Depression   . GERD (gastroesophageal reflux disease)   . Hepatitis C   . Hepatitis C   . History of stomach ulcers   . Sciatica   . Substance abuse [facility])     Patient Active Problem List   Diagnosis Date Noted  . Osteomyelitis ([place]) 06/04/2017  . Hyperglycemia 06/03/2017  . Discitis   . Streptococcal bacteremia   . GERD (gastroesophageal reflux disease) 05/17/2017  . Bipolar disorder ([place]) 05/17/2017  . Acute osteomyelitis of lumbar spine ([place]) 05/17/2017  . Cocaine abuse with  cocaine-induced mood disorder ([place]) 01/20/2017  . Benzodiazepine abuse ([place]) 01/20/2017  . Cocaine use disorder, moderate, dependence ([place]) 05/23/2015  . Opioid use disorder, moderate, dependence ([place]) 05/23/2015  . Benzodiazepine abuse in remission ([place]) 05/23/2015  . Substance or medication-induced bipolar and related disorder with onset during intoxication ([place]) 05/23/2015  . Chronic hepatitis C virus genotype 1a infection ([place])   . 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 FRACTURE SURGERY    . HARDWARE REMOVAL  01/27/2012   Procedure: HARDWARE REMOVAL;  Surgeon: Mcarthur Rossetti, MD;  Location: [facility];  Service: Orthopedics;  Laterality: Left;  . IR LUMBAR [place] W/IMG GUIDE  06/08/2017  . RADIOLOGY WITH ANESTHESIA N/A 06/08/2017   Procedure: DISC ASPIRATION;  Surgeon: Luanne Bras, MD;  Location: [facility];  Service: Radiology;  Laterality: N/A;  . TIBIA IM NAIL INSERTION  01/27/2012   Procedure: INTRAMEDULLARY (IM) NAIL TIBIAL;  Surgeon: Mcarthur Rossetti, MD;  Location: [facility];  Service: Orthopedics;  Laterality: Left;  Removal of IM Rod and Screws Left Tibia with Exchange Nail, Allograft Bone Graft Left Tibia        Home Medications    Prior to Admission medications   Medication Sig Start Date End Date Taking? Authorizing Provider  Aspirin-Acetaminophen-Caffeine (GOODY HEADACHE PO) Take 1-2 packets by mouth  daily as needed (for pain).   Yes [provider]  amLODipine (NORVASC) 5 MG tablet Take 1 tablet (5 mg total) by mouth daily. Patient not taking: Reported on 06/30/2017 06/24/17   Modena Jansky, MD  blood glucose meter kit and supplies KIT Dispense based on patient and insurance preference. Use up to four times daily as directed. (FOR ICD-9 250.00, 250.01). 06/23/17   Hongalgi, Lenis Dickinson, MD  cyclobenzaprine (FLEXERIL) 5 MG tablet Take 1 tablet (5 mg total) by mouth 3  (three) times daily as needed for muscle spasms. Patient not taking: Reported on 07/07/2017 06/23/17   Modena Jansky, MD  gabapentin (NEURONTIN) 300 MG capsule Take 1 capsule (300 mg total) by mouth 3 (three) times daily. Patient not taking: Reported on 09/22/2017 06/23/17 07/23/17  Modena Jansky, MD  glipiZIDE (GLUCOTROL) 5 MG tablet Take 1 tablet (5 mg total) by mouth daily before breakfast. Patient not taking: Reported on 06/30/2017 06/24/17   Modena Jansky, MD  lidocaine (LIDODERM) 5 % Place 1 patch onto the skin daily. Remove & Discard patch within 12 hours or as directed by MD. Apply to mid lower back. Patient not taking: Reported on 09/22/2017 06/24/17   Modena Jansky, MD  nicotine (NICODERM CQ - DOSED IN MG/24 HOURS) 21 mg/24hr patch Place 1 patch (21 mg total) onto the skin daily. Patient not taking: Reported on 06/30/2017 06/24/17   Modena Jansky, MD  pantoprazole (PROTONIX) 40 MG tablet Take 1 tablet (40 mg total) by mouth daily. Patient not taking: Reported on 06/30/2017 06/23/17 07/23/17  Modena Jansky, MD  QUEtiapine (SEROQUEL) 50 MG tablet Take 1 tablet (50 mg total) by mouth at bedtime. Patient not taking: Reported on 09/22/2017 06/23/17   Modena Jansky, MD    Family History Family History  Problem Relation Age of Onset  . Diabetes Mother     Social History Social History   Tobacco Use  . Smoking status: Current Some Day Smoker    Years: 24.00    Types: E-cigarettes  . Smokeless tobacco: Former Systems developer    Quit date: 08/08/2006  . Tobacco comment: mostly vapes  Substance Use Topics  . Alcohol use: Yes    Comment: occasionally  . Drug use: Yes    Types: Cocaine, IV    Comment: Pt states he "shoots dope".      Allergies   Patient has no known allergies.   Review of Systems Review of Systems  All other systems reviewed and are negative.    Physical Exam Updated Vital Signs BP 116/89   Pulse 79   Temp 98.8 F (37.1 C) (Temporal)   Resp 20    Ht _0  (1.6 m)   Wt 70.3 kg   SpO2 100%   BMI 27.46 kg/m   Physical Exam  Constitutional: He is oriented to person, place, and time. He appears well-developed and well-nourished. No distress.  HENT:  Head: Normocephalic and atraumatic.  Mouth/Throat: Oropharynx is clear and moist.  Neck: Normal range of motion. Neck supple.  Cardiovascular: Normal rate and regular rhythm. Exam reveals no friction rub.  No murmur heard. Pulmonary/Chest: Effort normal and breath sounds normal. No respiratory distress. He has no wheezes. He has no rales.  Abdominal: Soft. Bowel sounds are normal. He exhibits no distension. There is no tenderness.  Musculoskeletal: Normal range of motion. He exhibits no edema.  Neurological: He is alert and oriented to person, place, and time. Coordination normal.  Strength is  5 out of 5 in both lower extremities.  Sensation is intact throughout lower extremities.  DTRs are trace and symmetrical in the patellar and Achilles tendons bilaterally.  Skin: Skin is warm and dry. He is not diaphoretic.  There is a right antecubital abscess noted.  There is redness, fluctuance, and swelling in this area.  Nursing note and vitals reviewed.    ED Treatments / Results  Labs (all labs ordered are listed, but only abnormal results are displayed) Labs Reviewed  CBC - Abnormal; Notable for the following components:      Result Value   Platelets 133 (*)    All other components within normal limits  COMPREHENSIVE METABOLIC PANEL - Abnormal; Notable for the following components:   Glucose, Bld 127 (*)    AST 44 (*)    ALT 62 (*)    All other components within normal limits    EKG None  Radiology No results found.  Procedures Procedures (including critical care time)  Medications Ordered in ED Medications  lidocaine (PF) (XYLOCAINE) 1 % injection 4 mL (4 mLs Intradermal Given 09/22/17 2326)     Initial Impression / Assessment and Plan / ED Course  I have reviewed  the triage vital signs and the nursing notes.  Pertinent labs & imaging results that were available during my care of the patient were reviewed by me and considered in my medical decision making (see chart for details).  Patient with history of lumbar discitis secondary to IV drug abuse presenting with complaints of back pain.  He was recently seen at an outside facility in [place] where he had an MRI performed.  This showed inflammation surrounding lumbar vertebrae consistent with recurrent discitis.  The patient is not seen anyone and has not been on antibiotics since signing out [place] from this facility and presents here tonight.  He has no fever or white count.  He does appear uncomfortable.  I discussed the case with Dr. Manuella Ghazi from the hospitalist service who agrees to admit.  Final Clinical Impressions(s) / ED Diagnoses   Final diagnoses:  None    ED Discharge Orders    None       Veryl Speak, MD 09/23/17 [id number]

## 2017-09-22 NOTE — ED Notes (Signed)
ED Provider at bedside. 

## 2017-09-22 NOTE — ED Triage Notes (Signed)
Pt C/O chronic lower back pain. Pt states he was admitted and was supposed to stay in the hospital an additional 3 weeks for IV antibiotics for osteomyelitis of his spine.

## 2017-09-23 ENCOUNTER — Encounter (HOSPITAL_COMMUNITY): Payer: Self-pay

## 2017-09-23 ENCOUNTER — Inpatient Hospital Stay (HOSPITAL_COMMUNITY): Payer: Self-pay

## 2017-09-23 DIAGNOSIS — M5441 Lumbago with sciatica, right side: Secondary | ICD-10-CM

## 2017-09-23 DIAGNOSIS — M545 Low back pain: Secondary | ICD-10-CM

## 2017-09-23 DIAGNOSIS — G8929 Other chronic pain: Secondary | ICD-10-CM

## 2017-09-23 DIAGNOSIS — I10 Essential (primary) hypertension: Secondary | ICD-10-CM

## 2017-09-23 LAB — BASIC METABOLIC PANEL
Anion gap: 6 (ref 5–15)
BUN: 13 mg/dL (ref 6–20)
CO2: 28 mmol/L (ref 22–32)
Calcium: 8.9 mg/dL (ref 8.9–10.3)
Chloride: 104 mmol/L (ref 98–111)
Creatinine, Ser: 0.86 mg/dL (ref 0.61–1.24)
GFR calc Af Amer: >60 mL/min (ref >60)
GFR calc non Af Amer: >60 mL/min (ref >60)
Glucose, Bld: 298 mg/dL — ABNORMAL HIGH (ref 70–99)
Potassium: 3.9 mmol/L (ref 3.5–5.1)
SODIUM: 138 mmol/L (ref 135–145)

## 2017-09-23 LAB — MRSA PCR SCREENING: MRSA by PCR: POSITIVE — AB

## 2017-09-23 LAB — CBC
HCT: 44 % (ref 39.0–52.0)
Hemoglobin: 13.8 g/dL (ref 13.0–17.0)
MCH: 30.1 pg (ref 26.0–34.0)
MCHC: 31.4 g/dL (ref 30.0–36.0)
MCV: 96.1 fL (ref 78.0–100.0)
PLATELETS: 129 10*3/uL — AB (ref 150–400)
RBC: 4.58 MIL/uL (ref 4.22–5.81)
RDW: 15.2 % (ref 11.5–15.5)
WBC: 5.5 10*3/uL (ref 4.0–10.5)

## 2017-09-23 MED ORDER — GADOBENATE DIMEGLUMINE 529 MG/ML IV SOLN
14.0000 mL | Freq: Once | INTRAVENOUS | Status: AC | PRN
  Administered 2017-09-23: 14 mL via INTRAVENOUS

## 2017-09-23 MED ORDER — SODIUM CHLORIDE 0.9% FLUSH: 3.0000 mL | INTRAVENOUS | Status: DC | PRN

## 2017-09-23 MED ORDER — SENNOSIDES-DOCUSATE SODIUM 8.6-50 MG PO TABS
2.0000 | ORAL_TABLET | Freq: Two times a day (BID) | ORAL | Status: DC
  Administered 2017-09-23 – 2017-09-24 (×3): 2 via ORAL
  Filled 2017-09-23 (×3): qty 2

## 2017-09-23 MED ORDER — CYCLOBENZAPRINE HCL 10 MG PO TABS
5.0000 mg | ORAL_TABLET | Freq: Three times a day (TID) | ORAL | Status: DC | PRN
  Administered 2017-09-23 (×2): 5 mg via ORAL
  Filled 2017-09-23 (×2): qty 1

## 2017-09-23 MED ORDER — SODIUM CHLORIDE 0.9 % IV SOLN
2.0000 g | INTRAVENOUS | Status: DC
  Administered 2017-09-23 (×2): 2 g via INTRAVENOUS
  Filled 2017-09-23 (×3): qty 20
  Filled 2017-09-23: qty 2

## 2017-09-23 MED ORDER — VANCOMYCIN HCL IN DEXTROSE 750-5 MG/150ML-% IV SOLN
750.0000 mg | Freq: Once | INTRAVENOUS | Status: AC
  Administered 2017-09-23: 750 mg via INTRAVENOUS
  Filled 2017-09-23: qty 150

## 2017-09-23 MED ORDER — PANTOPRAZOLE SODIUM 40 MG PO TBEC
40.0000 mg | DELAYED_RELEASE_TABLET | Freq: Every day | ORAL | Status: DC
  Administered 2017-09-23 – 2017-09-24 (×2): 40 mg via ORAL
  Filled 2017-09-23 (×2): qty 1

## 2017-09-23 MED ORDER — ONDANSETRON HCL 4 MG/2ML IJ SOLN
4.0000 mg | Freq: Four times a day (QID) | INTRAMUSCULAR | Status: DC | PRN
  Administered 2017-09-23: 4 mg via INTRAVENOUS
  Filled 2017-09-23: qty 2

## 2017-09-23 MED ORDER — VANCOMYCIN HCL IN DEXTROSE 1-5 GM/200ML-% IV SOLN
1000.0000 mg | Freq: Once | INTRAVENOUS | Status: AC
  Administered 2017-09-23: 1000 mg via INTRAVENOUS
  Filled 2017-09-23: qty 200

## 2017-09-23 MED ORDER — DULOXETINE HCL 30 MG PO CPEP
30.0000 mg | ORAL_CAPSULE | Freq: Two times a day (BID) | ORAL | Status: DC
  Administered 2017-09-23 – 2017-09-24 (×3): 30 mg via ORAL
  Filled 2017-09-23 (×3): qty 1

## 2017-09-23 MED ORDER — ONDANSETRON HCL 4 MG PO TABS: 4.0000 mg | ORAL_TABLET | Freq: Four times a day (QID) | ORAL | Status: DC | PRN

## 2017-09-23 MED ORDER — VANCOMYCIN HCL IN DEXTROSE 750-5 MG/150ML-% IV SOLN
750.0000 mg | Freq: Three times a day (TID) | INTRAVENOUS | Status: DC
  Filled 2017-09-23 (×9): qty 150

## 2017-09-23 MED ORDER — QUETIAPINE FUMARATE 25 MG PO TABS
25.0000 mg | ORAL_TABLET | Freq: Two times a day (BID) | ORAL | Status: DC
  Administered 2017-09-23 – 2017-09-24 (×2): 25 mg via ORAL
  Filled 2017-09-23 (×2): qty 1

## 2017-09-23 MED ORDER — HYDROMORPHONE HCL 1 MG/ML IJ SOLN
1.0000 mg | INTRAMUSCULAR | Status: DC | PRN
  Administered 2017-09-23 (×4): 1 mg via INTRAVENOUS
  Filled 2017-09-23 (×4): qty 1

## 2017-09-23 MED ORDER — NICOTINE 21 MG/24HR TD PT24
21.0000 mg | MEDICATED_PATCH | Freq: Every day | TRANSDERMAL | Status: DC
  Administered 2017-09-23 – 2017-09-24 (×2): 21 mg via TRANSDERMAL
  Filled 2017-09-23 (×2): qty 1

## 2017-09-23 MED ORDER — TRAZODONE HCL 50 MG PO TABS
150.0000 mg | ORAL_TABLET | Freq: Every day | ORAL | Status: DC
  Filled 2017-09-23: qty 3

## 2017-09-23 MED ORDER — AMLODIPINE BESYLATE 5 MG PO TABS
5.0000 mg | ORAL_TABLET | Freq: Every day | ORAL | Status: DC
  Administered 2017-09-23 – 2017-09-24 (×2): 5 mg via ORAL
  Filled 2017-09-23 (×2): qty 1

## 2017-09-23 MED ORDER — GABAPENTIN 300 MG PO CAPS
300.0000 mg | ORAL_CAPSULE | Freq: Three times a day (TID) | ORAL | Status: DC
  Administered 2017-09-23 – 2017-09-24 (×5): 300 mg via ORAL
  Filled 2017-09-23 (×5): qty 1

## 2017-09-23 MED ORDER — HYDROMORPHONE HCL 1 MG/ML IJ SOLN
1.0000 mg | INTRAMUSCULAR | Status: DC | PRN
  Administered 2017-09-23 – 2017-09-24 (×3): 1 mg via INTRAVENOUS
  Filled 2017-09-23 (×3): qty 1

## 2017-09-23 MED ORDER — ACETAMINOPHEN 325 MG PO TABS: 650.0000 mg | ORAL_TABLET | Freq: Four times a day (QID) | ORAL | Status: DC | PRN

## 2017-09-23 MED ORDER — ACETAMINOPHEN 650 MG RE SUPP: 650.0000 mg | Freq: Four times a day (QID) | RECTAL | Status: DC | PRN

## 2017-09-23 MED ORDER — OXYCODONE HCL 5 MG PO TABS
10.0000 mg | ORAL_TABLET | ORAL | Status: DC | PRN
  Administered 2017-09-23 – 2017-09-24 (×4): 10 mg via ORAL
  Filled 2017-09-23 (×5): qty 2

## 2017-09-23 MED ORDER — QUETIAPINE FUMARATE 25 MG PO TABS
50.0000 mg | ORAL_TABLET | Freq: Every day | ORAL | Status: DC
  Administered 2017-09-23: 50 mg via ORAL
  Filled 2017-09-23: qty 2

## 2017-09-23 MED ORDER — HYDROXYZINE HCL 25 MG PO TABS
25.0000 mg | ORAL_TABLET | Freq: Three times a day (TID) | ORAL | Status: DC
  Administered 2017-09-23 – 2017-09-24 (×4): 25 mg via ORAL
  Filled 2017-09-23 (×4): qty 1

## 2017-09-23 MED ORDER — SODIUM CHLORIDE 0.9% FLUSH
3.0000 mL | Freq: Two times a day (BID) | INTRAVENOUS | Status: DC
  Administered 2017-09-23 – 2017-09-24 (×3): 3 mL via INTRAVENOUS

## 2017-09-23 MED ORDER — METHOCARBAMOL 500 MG PO TABS
750.0000 mg | ORAL_TABLET | Freq: Four times a day (QID) | ORAL | Status: DC
  Administered 2017-09-23 – 2017-09-24 (×5): 750 mg via ORAL
  Filled 2017-09-23 (×6): qty 2

## 2017-09-23 MED ORDER — SODIUM CHLORIDE 0.9 % IV SOLN
250.0000 mL | INTRAVENOUS | Status: DC | PRN
  Administered 2017-09-23: 250 mL via INTRAVENOUS

## 2017-09-23 NOTE — Progress Notes (Signed)
ANTIBIOTIC CONSULT NOTE-Preliminary  Pharmacy Consult for vancomycin Indication: osteomyelitis  No Known Allergies  Patient Measurements: Height: 5\' 3"  (160 cm) Weight: 154 lb 5.2 oz (70 kg) IBW/kg (Calculated) : 56.9 Adjusted Body Weight:   Vital Signs: Temp: 98.2 F (36.8 C) (09/13 0216) Temp Source: Oral (09/13 0216) BP: 126/87 (09/13 0216) Pulse Rate: 60 (09/13 0216)  Labs: Recent Labs    09/22/17 2140  WBC 5.1  HGB 15.4  PLT 133*  CREATININE 0.93    Estimated Creatinine Clearance: 90.9 mL/min (by C-G formula based on SCr of 0.93 mg/dL).  No results for input(s): VANCOTROUGH, VANCOPEAK, VANCORANDOM, GENTTROUGH, GENTPEAK, GENTRANDOM, TOBRATROUGH, TOBRAPEAK, TOBRARND, AMIKACINPEAK, AMIKACINTROU, AMIKACIN in the last 72 hours.   Microbiology: No results found for this or any previous visit (from the past 720 hour(s)).  Medical History: Past Medical History:  Diagnosis Date  . ADHD (attention deficit hyperactivity disorder)   . Bipolar 1 disorder ([facility])   . Chronic back pain   . Chronic pain    chronic l leg pain  . DDD (degenerative disc disease), lumbar   . Degenerative disc disease, lumbar   . Depression   . GERD (gastroesophageal reflux disease)   . Hepatitis C   . Hepatitis C   . History of stomach ulcers   . Sciatica   . Substance abuse ([facility])     Medications:  Scheduled:  . amLODipine  5 mg Oral Daily  . gabapentin  300 mg Oral TID  . nicotine  21 mg Transdermal Daily  . pantoprazole  40 mg Oral Daily  . QUEtiapine  50 mg Oral QHS  . sodium chloride flush  3 mL Intravenous Q12H    Assessment: 42 yo male with PMH DM, hepatitis C, opioid dependence, HTN, and recent treatment of severe progressive L2/3 discitis/osteomyelitis presented to ED with worsening back pain with radiation to the right leg.  Starting vancomycin and ceftriaxone.   Goal of Therapy:  Vancomycin trough level 15-20 mcg/ml  Plan:  Preliminary review of pertinent patient  information completed.  Protocol will be initiated with dose(s) of vancomycin 1 gram.  Jeani HawkingAnnie Penn clinical pharmacist will complete review during morning rounds to assess patient and finalize treatment regimen if needed.  Loyola MastMidkiff, Marsha Gundlach Scarlett, [facility] WillistonRPH 09/23/2017,4:07 AM

## 2017-09-23 NOTE — Progress Notes (Signed)
Inpatient Diabetes Program Recommendations  AACE/ADA: New Consensus Statement on Inpatient Glycemic Control (2019)  Target Ranges:  Prepandial:   less than 140 mg/dL      Peak postprandial:   less than 180 mg/dL (1-2 hours)      Critically ill patients:  140 - 180 mg/dL   Results for Joshua SleightETERMAN, Tlaloc Thomas (MRN [id number]) as of 09/23/2017 07:06  Ref. Range 09/22/2017 21:40 09/23/2017 05:31  Glucose Latest Ref Range: 70 - 99 mg/dL 324127 (H) 401298 (H)   Results for Joshua SleightETERMAN, Joshua Thomas (MRN [id number]) as of 09/23/2017 07:06  Ref. Range 06/13/2017 04:22  Hemoglobin A1C Latest Ref Range: 4.8 - 5.6 % 6.6 (H)   Review of Glycemic Control  Diabetes history: DM2 Outpatient Diabetes medications: Glipizide 5 mg QAM (per home med list patient is not taking) Current orders for Inpatient glycemic control: None  Inpatient Diabetes Program Recommendations: Insulin-Correction: While inpatient, please consider ordering CBGs with Novolog 0-9 units TID with meals and Novolog 0-5 units QHS.  Thanks Orlando PennerMarie Weldon Nouri, RN, MSN, CDE Diabetes Coordinator Inpatient Diabetes Program [phone] (Team Pager from 8am to 5pm)

## 2017-09-23 NOTE — Progress Notes (Signed)
Patient seen and evaluated, chart reviewed, please see EMR for updated orders. Please see full H&P dictated by admitting physician Dr Sherryll BurgerShah for same date of service.     42 year old with a history of bipolar disorder, chronic back pain, hepatitis C, tobacco use, polysubstance abuse disorder came to the hospital on 5/7 with complaints of back pain. Upon evaluation he was found to have acute discitis with osteomyelitis of L2-L3---  Patient blood culture from 05/17/2017 grew strep viridans sensitive to Rocephin, he was treated with IV Rocephin and discharged on p.o. amoxicillin to complete 6 weeks of antibiotic therapy, it is not clear if patient was compliant with antibiotic therapy.  Echocardiogram on 05/20/2017 was negative for vegetation/endocarditis  IR performed image guided biopsy of the L2-L3 disc on 06/08/2017 which was without growth  Continue IV Rocephin for now, await repeat MRI of the lumbar spine and we discussed with infectious disease about further management based on imaging studies in order clinical data  Patient seen and evaluated, chart reviewed, please see EMR for updated orders. Please see full H&P dictated by admitting physician Dr Sherryll BurgerShah for same date of service

## 2017-09-23 NOTE — H&P (Addendum)
History and Physical    Joshua Thomas JXB:[id number] DOB: May 23, 1975 DOA: 09/22/2017  PCP: Patient, No Pcp Per   Patient coming from: Home  Chief Complaint: Progressive low back pain  HPI: Joshua Thomas is a 42 y.o. male with medical history significant for diabetes, hepatitis C, opioid dependence, depression, hypertension, tobacco abuse, and recent treatment of severe progressive L2/3 discitis/osteomyelitis who presents to the emergency department with worsening low back pain as well as radiation to the right leg with minimal weakness.  He was recently seen at [facility] in [place] approximately 2 weeks ago when this pain first reoccurred.  He had a lumbar MRI demonstrating suspicious findings at L2/3 consistent with inflammatory process.  He was offered admission with IV antibiotics, but refused and since then, has had progression of his pain symptoms.  Of note, he was hospitalized earlier this summer with progressive discitis in the same region at [facility] for close to 7 weeks and had finished a course of oral Levaquin after discharge as recommended by ID Dr. Linus Salmons, but did not follow-up in the office. He is also noted to have a swelling with erythema to his right antecubital fossa where he continues to have IV drug use with methamphetamines.  He states that he has recently run out of his Suboxone as well. He denies any significant fever, chills, abdominal pain, chest pain, shortness of breath, nausea, vomiting, diarrhea, or other symptoms.  He has some mild difficulty with urination but is not incontinent.  No trouble with bowel movements noted.  ED Course: Vital signs stable and patient is afebrile.  Laboratory data remarkable only for mild thrombocytopenia with platelet count of 133.  Glucose noted to be 127.  He did have an incision and drainage attempted to his right antecubital fossa on the ED with no significant fluid drained.  He has not been given any other  medications in the ED.  MRI report from 09/07/2017 has been reviewed with noted concerns of recurrent discitis.  Review of Systems: All others reviewed and otherwise negative.  Past Medical History:  Diagnosis Date  . ADHD (attention deficit hyperactivity disorder)   . Bipolar 1 disorder ([place])   . Chronic back pain   . Chronic pain    chronic l leg pain  . DDD (degenerative disc disease), lumbar   . Degenerative disc disease, lumbar   . Depression   . GERD (gastroesophageal reflux disease)   . Hepatitis C   . Hepatitis C   . History of stomach ulcers   . Sciatica   . Substance abuse [facility])     Past Surgical History:  Procedure Laterality Date  . FRACTURE SURGERY    . HARDWARE REMOVAL  01/27/2012   Procedure: HARDWARE REMOVAL;  Surgeon: Mcarthur Rossetti, MD;  Location: [facility];  Service: Orthopedics;  Laterality: Left;  . IR LUMBAR [place] W/IMG GUIDE  06/08/2017  . RADIOLOGY WITH ANESTHESIA N/A 06/08/2017   Procedure: DISC ASPIRATION;  Surgeon: Luanne Bras, MD;  Location: [place];  Service: Radiology;  Laterality: N/A;  . TIBIA IM NAIL INSERTION  01/27/2012   Procedure: INTRAMEDULLARY (IM) NAIL TIBIAL;  Surgeon: Mcarthur Rossetti, MD;  Location: [facility];  Service: Orthopedics;  Laterality: Left;  Removal of IM Rod and Screws Left Tibia with Exchange Nail, Allograft Bone Graft Left Tibia     reports that he has been smoking e-cigarettes. He has smoked for the past 24.00 years. He quit smokeless tobacco use about  11 years ago. He reports that he drinks alcohol. He reports that he has current or past drug history. Drugs: Cocaine and IV.  No Known Allergies  Family History  Problem Relation Age of Onset  . Diabetes Mother     Prior to Admission medications   Medication Sig Start Date End Date Taking? Authorizing Provider  Aspirin-Acetaminophen-Caffeine (GOODY HEADACHE PO) Take 1-2 packets by mouth daily as needed (for pain).   Yes [provider]    amLODipine (NORVASC) 5 MG tablet Take 1 tablet (5 mg total) by mouth daily. Patient not taking: Reported on 06/30/2017 06/24/17   Modena Jansky, MD  blood glucose meter kit and supplies KIT Dispense based on patient and insurance preference. Use up to four times daily as directed. (FOR ICD-9 250.00, 250.01). 06/23/17   Hongalgi, Lenis Dickinson, MD  cyclobenzaprine (FLEXERIL) 5 MG tablet Take 1 tablet (5 mg total) by mouth 3 (three) times daily as needed for muscle spasms. Patient not taking: Reported on 07/07/2017 06/23/17   Modena Jansky, MD  gabapentin (NEURONTIN) 300 MG capsule Take 1 capsule (300 mg total) by mouth 3 (three) times daily. Patient not taking: Reported on 09/22/2017 06/23/17 07/23/17  Modena Jansky, MD  glipiZIDE (GLUCOTROL) 5 MG tablet Take 1 tablet (5 mg total) by mouth daily before breakfast. Patient not taking: Reported on 06/30/2017 06/24/17   Modena Jansky, MD  lidocaine (LIDODERM) 5 % Place 1 patch onto the skin daily. Remove & Discard patch within 12 hours or as directed by MD. Apply to mid lower back. Patient not taking: Reported on 09/22/2017 06/24/17   Modena Jansky, MD  nicotine (NICODERM CQ - DOSED IN MG/24 HOURS) 21 mg/24hr patch Place 1 patch (21 mg total) onto the skin daily. Patient not taking: Reported on 06/30/2017 06/24/17   Modena Jansky, MD  pantoprazole (PROTONIX) 40 MG tablet Take 1 tablet (40 mg total) by mouth daily. Patient not taking: Reported on 06/30/2017 06/23/17 07/23/17  Modena Jansky, MD  QUEtiapine (SEROQUEL) 50 MG tablet Take 1 tablet (50 mg total) by mouth at bedtime. Patient not taking: Reported on 09/22/2017 06/23/17   Modena Jansky, MD    Physical Exam: Vitals:   09/22/17 1954 09/22/17 1955 09/22/17 2159 09/22/17 2200  BP: (!) 150/81  128/85 116/89  Pulse: 85  85 79  Resp: 20  20 20   Temp: 98.8 F (37.1 C)     TempSrc: Temporal     SpO2: 100%  100% 100%  Weight:  70.3 kg    Height:  5' 3"  (1.6 m)      Constitutional:  NAD, calm, comfortable Vitals:   09/22/17 1954 09/22/17 1955 09/22/17 2159 09/22/17 2200  BP: (!) 150/81  128/85 116/89  Pulse: 85  85 79  Resp: 20  20 20   Temp: 98.8 F (37.1 C)     TempSrc: Temporal     SpO2: 100%  100% 100%  Weight:  70.3 kg    Height:  5' 3"  (1.6 m)     Eyes: lids and conjunctivae normal ENMT: Mucous membranes are moist.  Neck: normal, supple Respiratory: clear to auscultation bilaterally. Normal respiratory effort. No accessory muscle use.  Cardiovascular: Regular rate and rhythm, no murmurs. No extremity edema. Abdomen: no tenderness, no distention. Bowel sounds positive.  Musculoskeletal:  No joint deformity upper and lower extremities.   Skin: no rashes, lesions, ulcers.  Right antecubital fossa dressed in gauze that is clean dry and intact. Psychiatric:  Normal judgment and insight. Alert and oriented x 3. Normal mood.   Labs on Admission: I have personally reviewed following labs and imaging studies  CBC: Recent Labs  Lab 09/22/17 2140  WBC 5.1  HGB 15.4  HCT 47.8  MCV 95.8  PLT 761*   Basic Metabolic Panel: Recent Labs  Lab 09/22/17 2140  NA 141  K 4.4  CL 105  CO2 30  GLUCOSE 127*  BUN 8  CREATININE 0.93  CALCIUM 9.5   GFR: Estimated Creatinine Clearance: 91.2 mL/min (by C-G formula based on SCr of 0.93 mg/dL). Liver Function Tests: Recent Labs  Lab 09/22/17 2140  AST 44*  ALT 62*  ALKPHOS 56  BILITOT 0.5  PROT 7.8  ALBUMIN 3.6   No results for input(s): LIPASE, AMYLASE in the last 168 hours. No results for input(s): AMMONIA in the last 168 hours. Coagulation Profile: No results for input(s): INR, PROTIME in the last 168 hours. Cardiac Enzymes: No results for input(s): CKTOTAL, CKMB, CKMBINDEX, TROPONINI in the last 168 hours. BNP (last 3 results) No results for input(s): PROBNP in the last 8760 hours. HbA1C: No results for input(s): HGBA1C in the last 72 hours. CBG: No results for input(s): GLUCAP in the last 168  hours. Lipid Profile: No results for input(s): CHOL, HDL, LDLCALC, TRIG, CHOLHDL, LDLDIRECT in the last 72 hours. Thyroid Function Tests: No results for input(s): TSH, T4TOTAL, FREET4, T3FREE, THYROIDAB in the last 72 hours. Anemia Panel: No results for input(s): VITAMINB12, FOLATE, FERRITIN, TIBC, IRON, RETICCTPCT in the last 72 hours. Urine analysis:    Component Value Date/Time   COLORURINE STRAW (A) 06/03/2017 1220   APPEARANCEUR CLEAR 06/03/2017 1220   APPEARANCEUR [place] 09/28/2013 1707   LABSPEC 1.004 (L) 06/03/2017 1220   LABSPEC 1.017 09/28/2013 1707   PHURINE 6.0 06/03/2017 1220   GLUCOSEU NEGATIVE 06/03/2017 1220   GLUCOSEU Negative 09/28/2013 1707   HGBUR NEGATIVE 06/03/2017 [place] 06/03/2017 1220   BILIRUBINUR Negative 09/28/2013 1707   KETONESUR NEGATIVE 06/03/2017 1220   PROTEINUR NEGATIVE 06/03/2017 1220   UROBILINOGEN 0.2 03/24/2014 1341   NITRITE NEGATIVE 06/03/2017 1220   LEUKOCYTESUR NEGATIVE 06/03/2017 1220   LEUKOCYTESUR Negative 09/28/2013 1707    Radiological Exams on Admission: No results found.  Assessment/Plan Principal Problem:   Lumbago Active Problems:   Chronic hepatitis C virus genotype 1a infection (HCC)   Opioid use disorder, moderate, dependence (HCC)   GERD (gastroesophageal reflux disease)   Bipolar disorder (HCC)   Essential hypertension    1. Acutely worsening low back pain suspicious of recurrent discitis/osteomyelitis at L2/3.  Patient had had no growth noted in prior cultures from admission earlier in the summer.  Will empirically place on IV vancomycin as well as Rocephin for coverage and repeat lumbar MRI in a.m. for further assessment.  Pain management with IV Dilaudid for breakthrough.  PT evaluation should be considered for further assessment once pain is improved.  Please consider consultation with ID as needed in a.m. for assistance in antibiotic management after repeat MRI has been performed. 2. Right  antecubital fossa cyst versus abscess.  Continue to monitor while on vancomycin and Rocephin.  No drainage noted with I&D in ED. 3. Hypertension.  Currently controlled-maintain on amlodipine. 4. Depression/bipolar disorder.  Maintain on Seroquel. 5. Opioid use disorder.  Hold Suboxone as patient has not taken this medication for several weeks.  Maintain on IV medications for breakthrough pain management along with gabapentin and Flexeril. 6. GERD.  PPI. 7.  Type 2 diabetes.  No significant hyperglycemia currently noted.  Consider SSI and initiation as needed.  Will maintain on carb modified diet. 8. Tobacco abuse.  Maintain on nicotine patch. 9. Ongoing illicit drug abuse.  Noted to have recurrent history of AMA discharge and prior noted cocaine use as well as methamphetamines.  Will check UDS.   DVT prophylaxis: SCDs Code Status: Full Family Communication: Mother at bedside Disposition Plan: Treatment of low back pain with suspected discitis Consults called: None Admission status: Inpatient, MedSurg   Yaeko Fazekas Darleen Crocker DO [facility] Pager [phone]  If 7PM-7AM, please contact night-coverage [email or web address] Password [facility]  09/23/2017, 12:55 AM

## 2017-09-23 NOTE — Progress Notes (Signed)
Pharmacy Antibiotic Note  Joshua Thomas is a 42 y.o. male admitted on 09/22/2017 with osteomyelitis.  Pharmacy has been consulted for Vancomycin dosing.  Plan: Vancomycin 1000mg  given in ED, then 750mg  IV every 8 hours.  Goal trough 15-20 mcg/mL.  Also on Ceftriaxone 2gm IV q24h F/U cxs and clinical progress Monitor V/S, labs and levels as indicated  Height: 5\' 3"  (160 cm) Weight: 154 lb 5.2 oz (70 kg) IBW/kg (Calculated) : 56.9  Temp (24hrs), Avg:98.2 F (36.8 C), Min:97.7 F (36.5 C), Max:98.8 F (37.1 C)  Recent Labs  Lab 09/22/17 2140 09/23/17 0531  WBC 5.1 5.5  CREATININE 0.93 0.86    Estimated Creatinine Clearance: 98.3 mL/min (by C-G formula based on SCr of 0.86 mg/dL).    No Known Allergies  Antimicrobials this admission: Ceftriaxone 9/13>>  Vancomycin 9/13 >>   Dose adjustments this admission: N/A  Microbiology results:  BCx:   MRSA PCR:  Thank you for allowing pharmacy to be a part of this patient's care.  Elder CyphersLorie Lindon Kiel, BS Pharm D, [place] Clinical Pharmacist Pager [phone] 09/23/2017 7:55 AM

## 2017-09-24 MED ORDER — QUETIAPINE FUMARATE 50 MG PO TABS: 50.0000 mg | ORAL_TABLET | Freq: Every day | ORAL | 2 refills | Status: DC

## 2017-09-24 MED ORDER — HYDROXYZINE HCL 25 MG PO TABS: 25.0000 mg | ORAL_TABLET | Freq: Three times a day (TID) | ORAL | 1 refills | Status: DC | PRN

## 2017-09-24 MED ORDER — QUETIAPINE FUMARATE 25 MG PO TABS: 25.0000 mg | ORAL_TABLET | ORAL | 2 refills | Status: DC

## 2017-09-24 MED ORDER — DULOXETINE HCL 30 MG PO CPEP: 30.0000 mg | ORAL_CAPSULE | Freq: Two times a day (BID) | ORAL | 1 refills | Status: DC

## 2017-09-24 MED ORDER — TRAZODONE HCL 150 MG PO TABS: 150.0000 mg | ORAL_TABLET | Freq: Every evening | ORAL | 3 refills | Status: DC | PRN

## 2017-09-24 MED ORDER — GABAPENTIN 300 MG PO CAPS: 300.0000 mg | ORAL_CAPSULE | Freq: Three times a day (TID) | ORAL | 2 refills | Status: DC

## 2017-09-24 MED ORDER — ACETAMINOPHEN 325 MG PO TABS: 650.0000 mg | ORAL_TABLET | Freq: Four times a day (QID) | ORAL | 0 refills | Status: DC | PRN

## 2017-09-24 MED ORDER — SENNOSIDES-DOCUSATE SODIUM 8.6-50 MG PO TABS: 2.0000 | ORAL_TABLET | Freq: Every day | ORAL | 2 refills | Status: DC

## 2017-09-24 MED ORDER — MUPIROCIN 2 % EX OINT
1.0000 "application " | TOPICAL_OINTMENT | Freq: Two times a day (BID) | CUTANEOUS | Status: DC
  Administered 2017-09-24 (×2): 1 via NASAL
  Filled 2017-09-24: qty 22

## 2017-09-24 MED ORDER — CHLORHEXIDINE GLUCONATE CLOTH 2 % EX PADS
6.0000 | MEDICATED_PAD | Freq: Every day | CUTANEOUS | Status: DC
  Administered 2017-09-24: 6 via TOPICAL

## 2017-09-24 NOTE — Discharge Summary (Signed)
Joshua Thomas, is a 42 y.o. male  DOB 1975/09/26  MRN [id number].  Admission date:  09/22/2017  Admitting Physician  [place], DO  Discharge Date:  09/24/2017   Primary MD  Patient, No Pcp Per  Recommendations for primary care physician for things to follow:   1) avoid cocaine, heroine and other street/illicit drugs 2) follow-up with internal medicine clinic in [place] and Dr. Johnnette Gourd to refill your Suboxone 3) follow-up with neurosurgery or orthospine surgery for your disc problems in your back 4) consider outpatient OR inpatient drug rehab program   Admission Diagnosis  dizzy,back pain abcess   Discharge Diagnosis  dizzy,back pain abcess   Principal Problem:   Lumbago Active Problems:   Chronic hepatitis C virus genotype 1a infection ([place])   Opioid use disorder, moderate, dependence (H[facility])   GERD (gastroesophageal reflux disease)   Bipolar disorder ([place])   Osteomyelitis ([place])   Essential hypertension      Past Medical History:  Diagnosis Date  . ADHD (attention deficit hyperactivity disorder)   . Bipolar 1 disorder ([place])   . Chronic back pain   . Chronic pain    chronic l leg pain  . DDD (degenerative disc disease), lumbar   . Degenerative disc disease, lumbar   . Depression   . GERD (gastroesophageal reflux disease)   . Hepatitis C   . Hepatitis C   . History of stomach ulcers   . Sciatica   . Substance abuse [facility])     Past Surgical History:  Procedure Laterality Date  . FRACTURE SURGERY    . HARDWARE REMOVAL  01/27/2012   Procedure: HARDWARE REMOVAL;  Surgeon: Mcarthur Rossetti, MD;  Location: [facility];  Service: Orthopedics;  Laterality: Left;  . IR LUMBAR [place] W/IMG GUIDE  06/08/2017  . RADIOLOGY WITH ANESTHESIA N/A 06/08/2017   Procedure: DISC ASPIRATION;  Surgeon: Luanne Bras, MD;  Location: [place];  Service: Radiology;  Laterality: N/A;  .  TIBIA IM NAIL INSERTION  01/27/2012   Procedure: INTRAMEDULLARY (IM) NAIL TIBIAL;  Surgeon: Mcarthur Rossetti, MD;  Location: [facility];  Service: Orthopedics;  Laterality: Left;  Removal of IM Rod and Screws Left Tibia with Exchange Nail, Allograft Bone Graft Left Tibia       HPI  from the history and physical done on the day of admission:    Patient coming from: Home  Chief Complaint: Progressive low back pain  HPI: Joshua Thomas is a 42 y.o. male with medical history significant for diabetes, hepatitis C, opioid dependence, depression, hypertension, tobacco abuse, and recent treatment of severe progressive L2/3 discitis/osteomyelitis who presents to the emergency department with worsening low back pain as well as radiation to the right leg with minimal weakness.  He was recently seen at [facility] in [place] approximately 2 weeks ago when this pain first reoccurred.  He had a lumbar MRI demonstrating suspicious findings at L2/3 consistent with inflammatory process.  He was offered admission with IV antibiotics, but  refused and since then, has had progression of his pain symptoms.  Of note, he was hospitalized earlier this summer with progressive discitis in the same region at [facility] for close to 7 weeks and had finished a course of oral Levaquin after discharge as recommended by ID Dr. Linus Salmons, but did not follow-up in the office. He is also noted to have a swelling with erythema to his right antecubital fossa where he continues to have IV drug use with methamphetamines.  He states that he has recently run out of his Suboxone as well. He denies any significant fever, chills, abdominal pain, chest pain, shortness of breath, nausea, vomiting, diarrhea, or other symptoms.  He has some mild difficulty with urination but is not incontinent.  No trouble with bowel movements noted.  ED Course: Vital signs stable and patient is afebrile.  Laboratory data remarkable only for  mild thrombocytopenia with platelet count of 133.  Glucose noted to be 127.  He did have an incision and drainage attempted to his right antecubital fossa on the ED with no significant fluid drained.  He has not been given any other medications in the ED.  MRI report from 09/07/2017 has been reviewed with noted concerns of recurrent discitis.     Hospital Course:   IMPRESSION: 1. Progressive loss of height along the inferior endplate of L2 anteriorly. 2. Changes of disc osteomyelitis are otherwise improved without definite residual fluid in the joint space. 3. Previous epidural fluid collection has resolved. 4. Inflammatory changes in the paraspinous soft tissues at the disc space have improved. 5. Mild left foraminal narrowing at L1-2. 6. Moderate right and mild left foraminal stenosis at L2-3 is stable. 7. Mild foraminal narrowing bilaterally at L4-5 is stable.     Plan:- 42 year old with a history of bipolar disorder, chronic back pain, hepatitis C, tobacco use, polysubstance abuse disorder came to the hospital on 5/7 with complaints of back pain. Upon evaluation he was found to have acute discitis with osteomyelitis of L2-L3---  1)LBP--- patient's current back pain appears to be more discogenic than infection related, MRI of the lumbar spine suggest resolution of prior discitis/osteomyelitis, MRI and overall case reviewed with on-call ID physician Dr.Comer who states  that there is no evidence for acute osteomyelitis or discitis at this time, okay to discharge patient home without further antibiotic treatment at this time.  Review of lumbar MRI from 06/03/2017.  Patient to follow-up with orthospine or neurosurgery to discuss his discogenic low back problems  Patient blood culture from 05/17/2017 grew strep viridans sensitive to Rocephin, he was treated with IV Rocephin and discharged on p.o. amoxicillin to complete 6 weeks of antibiotic therapy, it is not clear if patient was compliant  with antibiotic therapy. Echocardiogram on 05/20/2017 was negative for vegetation/endocarditis IR performed image guided biopsy of the L2-L3 disc on 06/08/2017 which was without growth Empirically treated with  IV Rocephin this admission however repeat MRI of the lumbar spine as discussed above without evidence of discitis or epidural abscess, antibiotics has been discontinued at this time at the advice of the on-call infectious disease physician Dr. Linus Salmons, who reviewed MRI from this admission and previous MRI from 06/03/2017   2) polysubstance abuse/IVDU/opiate addiction-- follow-up with internal medicine clinic in [place] and Dr. Johnnette Gourd to refill your Suboxone  Discharge Condition: stable  Follow UP--- up to spine/neurosurgeon as well as pain specialist and Suboxone refill with Dr. Johnnette Gourd   Consults obtained -phone consult with infectious disease physician  Dr. Linus Salmons  Diet and Activity recommendation:  As advised  Discharge Instructions    Discharge Instructions    Call MD for:  difficulty breathing, headache or visual disturbances   Complete by:  As directed    Call MD for:  persistant dizziness or light-headedness   Complete by:  As directed    Call MD for:  persistant nausea and vomiting   Complete by:  As directed    Call MD for:  severe uncontrolled pain   Complete by:  As directed    Call MD for:  temperature >100.4   Complete by:  As directed    Diet - low sodium heart healthy   Complete by:  As directed    Discharge instructions   Complete by:  As directed    1) avoid cocaine, heroine and other street/illicit drugs 2) follow-up with internal medicine clinic in [place] and Dr. Johnnette Gourd to refill your Suboxone 3) follow-up with neurosurgery or orthospine surgery for your disc problems in your back 4) consider outpatient OR inpatient drug rehab program   Increase activity slowly   Complete by:  As directed         Discharge Medications       Allergies as of 09/24/2017   No Known Allergies     Medication List    STOP taking these medications   cyclobenzaprine 5 MG tablet Commonly known as:  FLEXERIL   GOODY HEADACHE PO     TAKE these medications   acetaminophen 325 MG tablet Commonly known as:  TYLENOL Take 2 tablets (650 mg total) by mouth every 6 (six) hours as needed for mild pain (or Fever >/= 101).   amLODipine 5 MG tablet Commonly known as:  NORVASC Take 1 tablet (5 mg total) by mouth daily.   blood glucose meter kit and supplies Kit Dispense based on patient and insurance preference. Use up to four times daily as directed. (FOR ICD-9 250.00, 250.01).   DULoxetine 30 MG capsule Commonly known as:  CYMBALTA Take 1 capsule (30 mg total) by mouth 2 (two) times daily.   gabapentin 300 MG capsule Commonly known as:  NEURONTIN Take 1 capsule (300 mg total) by mouth 3 (three) times daily.   glipiZIDE 5 MG tablet Commonly known as:  GLUCOTROL Take 1 tablet (5 mg total) by mouth daily before breakfast.   hydrOXYzine 25 MG tablet Commonly known as:  ATARAX/VISTARIL Take 1 tablet (25 mg total) by mouth every 8 (eight) hours as needed for anxiety or nausea (insomnia).   lidocaine 5 % Commonly known as:  LIDODERM Place 1 patch onto the skin daily. Remove & Discard patch within 12 hours or as directed by MD. Apply to mid lower back.   nicotine 21 mg/24hr patch Commonly known as:  NICODERM CQ - dosed in mg/24 hours Place 1 patch (21 mg total) onto the skin daily.   pantoprazole 40 MG tablet Commonly known as:  PROTONIX Take 1 tablet (40 mg total) by mouth daily.   QUEtiapine 50 MG tablet Commonly known as:  SEROQUEL Take 1 tablet (50 mg total) by mouth at bedtime. What changed:  Another medication with the same name was added. Make sure you understand how and when to take each.   QUEtiapine 25 MG tablet Commonly known as:  SEROQUEL Take 1 tablet (25 mg total) by mouth every morning. What changed:  You  were already taking a medication with the same name, and this prescription was added. Make sure  you understand how and when to take each.   senna-docusate 8.6-50 MG tablet Commonly known as:  Senokot-S Take 2 tablets by mouth at bedtime. For bowels   traZODone 150 MG tablet Commonly known as:  DESYREL Take 1 tablet (150 mg total) by mouth at bedtime as needed for sleep.       Major procedures and Radiology Reports - PLEASE review detailed and final reports for all details, in brief -    Mr Lumbar Spine W Wo Contrast  Result Date: 09/23/2017 CLINICAL DATA:  Persistent back pain. Recent infection. Disc osteomyelitis. EXAM: MRI LUMBAR SPINE WITHOUT AND WITH CONTRAST TECHNIQUE: Multiplanar and multiecho pulse sequences of the lumbar spine were obtained without and with intravenous contrast. CONTRAST:  28m MULTIHANCE GADOBENATE DIMEGLUMINE 529 MG/ML IV SOLN COMPARISON:  MRI of the lumbar spine without and with contrast 06/03/2017 and 05/17/2017. FINDINGS: Segmentation: 5 non rib-bearing lumbar type vertebral bodies are present. The lowest fully formed vertebral body is L5. Alignment: AP alignment is anatomic. Levoconvex curvature is centered at L3. Vertebrae: There is further loss of height along the inferior endplate of L2 than on the prior exam. Extensive marrow signal abnormality remains through at L2. There is some normalization of marrow signal inferiorly at L3. Persistent enhancement is present throughout both vertebral bodies. There is no fluid in the joint space. Previous epidural collection has resolved. Mild edema and enhancement into the paraspinous soft tissues remains, improved from prior exam. Remote superior endplate fracture of L1 is stable. Endplate degenerative changes are present at L5-S1, left greater than right. Marrow signal and vertebral body heights are otherwise normal. Conus medullaris and cauda equina: Conus extends to the L1-2 level. Conus and cauda equina appear normal.  Paraspinal and other soft tissues: Mild edema and enhancement in the paraspinous soft tissues at L2-3 is improved. Limited imaging the abdomen is unremarkable. No other significant paraspinous soft tissue abnormalities are present. Disc levels: L1-2: A far left lateral disc protrusion is present. Mild left foraminal narrowing is present. Central canal and right foramen are patent. L2-3: Previously seen fluid collection has resolved. A broad-based disc protrusion remains. Moderate right and mild left foraminal stenosis is not significantly changed. The central canal is patent. L3-4: Disc bulging facet hypertrophy is present without significant stenosis. L4-5: Mild disc bulging is present. Moderate facet hypertrophy is evident. This results in stable mild foraminal narrowing bilaterally. L5-S1: There is chronic loss of disc height. Mild left foraminal narrowing is stable. IMPRESSION: 1. Progressive loss of height along the inferior endplate of L2 anteriorly. 2. Changes of disc osteomyelitis are otherwise improved without definite residual fluid in the joint space. 3. Previous epidural fluid collection has resolved. 4. Inflammatory changes in the paraspinous soft tissues at the disc space have improved. 5. Mild left foraminal narrowing at L1-2. 6. Moderate right and mild left foraminal stenosis at L2-3 is stable. 7. Mild foraminal narrowing bilaterally at L4-5 is stable. Electronically Signed   By: CSan MorelleM.D.   On: 09/23/2017 15:36    Micro Results   Recent Results (from the past 240 hour(s))  MRSA PCR Screening     Status: Abnormal   Collection Time: 09/23/17  2:36 AM  Result Value Ref Range Status   MRSA by PCR POSITIVE (A) NEGATIVE Final    Comment:        The GeneXpert MRSA Assay (FDA approved for NASAL specimens only), is one component of a comprehensive MRSA colonization surveillance program. It is not intended to diagnose  MRSA infection nor to guide or monitor treatment for MRSA  infections. RESULT CALLED TO, READ BACK BY AND VERIFIED WITH: [place] AT 1029 BY HFLYNT 09/23/17 Performed at [facility], [address], [place], [place] [address]        Today   Subjective    Joshua Thomas today has no new complaints, No fever  Or chills ,  No Nausea, Vomiting or Diarrhea     Patient has been seen and examined prior to discharge   Objective   Blood pressure 119/83, pulse 67, temperature 97.6 F (36.4 C), resp. rate 15, height 5' 3"  (1.6 m), weight 70 kg, SpO2 94 %.   Intake/Output Summary (Last 24 hours) at 09/24/2017 1404 Last data filed at 09/24/2017 0556 Gross per 24 hour  Intake 709.66 ml  Output 200 ml  Net 509.66 ml    Exam Gen:- Awake Alert,  In no apparent distress  HEENT:- [place].AT, No sclera icterus Neck-Supple Neck,No JVD,.  Lungs-  CTAB , good air movement CV- S1, S2 normal Abd-  +ve B.Sounds, Abd Soft, No tenderness,    Extremity/Skin:- No  edema,   good pulses Psych-affect is appropriate, oriented x3 Neuro-no new focal deficits, no tremors MSK-complains of back pain with range of motion, no crepitus or step deformity on palpation over the spinous processes of the lumbar or thoracic spine, overlying skin without erythema warmth or swelling   Data Review   CBC w Diff:  Lab Results  Component Value Date   WBC 5.5 09/23/2017   HGB 13.8 09/23/2017   HGB 16.6 09/28/2013   HCT 44.0 09/23/2017   HCT 50.8 09/28/2013   PLT 129 (L) 09/23/2017   PLT 158 09/28/2013   LYMPHOPCT 42 06/14/2017   LYMPHOPCT 29.8 09/28/2013   MONOPCT 8 06/14/2017   MONOPCT 9.9 09/28/2013   EOSPCT 5 06/14/2017   EOSPCT 2.8 09/28/2013   BASOPCT 0 06/14/2017   BASOPCT 0.6 09/28/2013    CMP:  Lab Results  Component Value Date   NA 138 09/23/2017   NA 142 09/28/2013   K 3.9 09/23/2017   K 4.2 09/28/2013   CL 104 09/23/2017   CL 111 (H) 09/28/2013   CO2 28 09/23/2017   CO2 23 09/28/2013   BUN 13 09/23/2017   BUN 13 09/28/2013   CREATININE 0.86  09/23/2017   CREATININE 0.93 09/28/2013   PROT 7.8 09/22/2017   PROT 8.0 09/28/2013   ALBUMIN 3.6 09/22/2017   ALBUMIN 3.9 09/28/2013   BILITOT 0.5 09/22/2017   BILITOT 0.5 09/28/2013   ALKPHOS 56 09/22/2017   ALKPHOS 54 09/28/2013   AST 44 (H) 09/22/2017   AST 99 (H) 09/28/2013   ALT 62 (H) 09/22/2017   ALT 241 (H) 09/28/2013  .   Total Discharge time is about 33 minutes  Roxan Hockey M.D on 09/24/2017 at 2:04 PM  Pager---[phone]  Go to [email or web address] - password [email or web address] for contact info  [facility] - Office  [phone]

## 2017-09-24 NOTE — Discharge Instructions (Signed)
1) avoid cocaine, heroine and other street/illicit drugs 2) follow-up with internal medicine clinic in [place] and Dr. Harmon DunEric Hoffman to refill your Suboxone 3) follow-up with neurosurgery or orthospine surgery for your disc problems in your back 4) consider outpatient OR inpatient drug rehab program

## 2017-09-24 NOTE — Care Management (Signed)
Spoke with bedside RN regarding patient needs.  Resources placed on AVS for clinics in [place].  RN states pt has not been concerned with paying for prescriptions.  She will call back to CM if paying for scripts is a problem.  Pt is waiting for a ride- his mother will take him home when she is off work.

## 2017-09-25 NOTE — Progress Notes (Signed)
Patient discharged home per orders. Discharge instructions reviewed with patient per Janyce LlanosS. Garzon, RN. Patient verbalizes understanding. IV discontinued per orders. Patient tolerated well. Belongings packed up by patient. Denies needs at this time. [facility].

## 2017-09-26 ENCOUNTER — Telehealth: Payer: Self-pay

## 2017-09-26 NOTE — Telephone Encounter (Signed)
Needs to speak with about getting an appt for OUD. Please call pt back.

## 2017-09-26 NOTE — Telephone Encounter (Signed)
Spoke to dr Criselda Peachesmullen, will schedule for 9/24 oud

## 2017-09-28 ENCOUNTER — Telehealth: Payer: Self-pay | Admitting: *Deleted

## 2017-09-28 LAB — DRUG PROFILE, UR, 9 DRUGS (LABCORP)
Amphetamines, Urine: NEGATIVE ng/mL
BARBITURATE, UR: NEGATIVE ng/mL
BENZODIAZEPINE QUANT UR: NEGATIVE ng/mL
CANNABINOID QUANT UR: NEGATIVE ng/mL
Cocaine (Metab.): NEGATIVE ng/mL
Methadone Screen, Urine: NEGATIVE ng/mL
OPIATE QUANT UR: NEGATIVE ng/mL
Phencyclidine, Ur: NEGATIVE ng/mL
Propoxyphene, Urine: NEGATIVE ng/mL

## 2017-09-28 NOTE — Telephone Encounter (Signed)
RN spoke with patient's mother, answered questions generally about hepatitis C treatment (Dr Mikey BussingHoffman can send him here after he gets the [employer], we will procure medication assistance), resources to apply for medicaid (social services or [facility]). Joshua Thomas, Joshua Munter M, RN

## 2017-09-28 NOTE — Telephone Encounter (Signed)
-----   [facility] from [facility] sent at 09/28/2017  2:30 PM EDT ----- Contact: [phone] Patient called returning Dr Ephriam Knucklesomer's call. York SpanielSaid he has a few questions as well

## 2017-10-04 ENCOUNTER — Ambulatory Visit (INDEPENDENT_AMBULATORY_CARE_PROVIDER_SITE_OTHER): Payer: Self-pay | Admitting: Internal Medicine

## 2017-10-04 VITALS — BP 119/80 | HR 81 | Temp 98.5°F | Resp 20 | Wt 154.9 lb

## 2017-10-04 DIAGNOSIS — B1920 Unspecified viral hepatitis C without hepatic coma: Secondary | ICD-10-CM

## 2017-10-04 DIAGNOSIS — F1120 Opioid dependence, uncomplicated: Secondary | ICD-10-CM

## 2017-10-04 DIAGNOSIS — F319 Bipolar disorder, unspecified: Secondary | ICD-10-CM

## 2017-10-04 DIAGNOSIS — L818 Other specified disorders of pigmentation: Secondary | ICD-10-CM

## 2017-10-04 DIAGNOSIS — M4646 Discitis, unspecified, lumbar region: Secondary | ICD-10-CM

## 2017-10-04 MED ORDER — BUPRENORPHINE HCL-NALOXONE HCL 8-2 MG SL SUBL: 1.0000 | SUBLINGUAL_TABLET | Freq: Two times a day (BID) | SUBLINGUAL | 0 refills | Status: DC

## 2017-10-04 MED FILL — BUPRENORPHINE HCL-NALOXONE: 8-2 | 15 days supply | Qty: 30 | Fill #0

## 2017-10-04 NOTE — Patient Instructions (Signed)

## 2017-10-04 NOTE — Assessment & Plan Note (Addendum)
Restart on Suboxone therapy, plan to get to 16mg  Total daily dose in divded doses. Will start with half tab today, discussed home induction again, goal to start mild to moderate withdrawal before taking first dose. He is working on applying for [employer].  We will need to get him established with psych in the near future, I will have him focus on establishing with PCP tomorrow and follow up in 2 weeks here for OUD treatment. Obtain UTox today Reviewed CSRS- appropriate no recent Rx since last bupe

## 2017-10-04 NOTE — Progress Notes (Signed)
   10/04/2017  Joshua Thomas presents for follow up of opioid use disorder I have reviewed the prior induction visit, follow up visits, and telephone encounters relevant to opiate use disorder (OUD) treatment.   Current daily dose: Suboxone 16 mg daily  Date of Induction: 07/12/17  Current follow up interval, in weeks: Weekly  The patient has not been adherent with the buprenorphine for OUD contract.   Last UDS Result: 07/12/17- unexpected for alprazolam and oxymorphone  HPI: Joshua Thomas is a 42 y.o m with lumbs discitis, bipolar 1 disorder, degenerative disc disease, hepatitis c, polysubstance disorder (IV heroin and cocaine) who presents for opiod use disorder follow up.  He was last seen in our OUD clinic over 2 months ago.  Since that time he has relapsed to polysubstance use including herion, amphetamines, cocaine.  He uses both intravenously and intranasally.  Last IV and intranasal use was last night (herion and cocaine). [facility] after [facility] for [facility] pain. A repeat MRI of his back showed degenerative changes from his previous lumbar discitis but no clear signs of active infection, this was discussed with Dr Luciana Axeomer of ID and he was deemed stable for discharge.  He reports he feels much more comforted by this news.  He thinks he failed suboxone therapy last time due to unrealistic expectations>> wanted suboxone to treat pain in his back, he does feel that it controlled cravings.  He is interested in restarting therapy.  He has [facility] [place] [facility] to establish with a PCP for his other medical issues.  Exam:   Vitals:   10/04/17 1024  BP: 119/80  Pulse: 81  Resp: 20  Temp: 98.5 F (36.9 C)  TempSrc: Oral  SpO2: 100%  Weight: 154 lb 14.4 oz (70.3 kg)   Physical Exam  Nursing note and vitals reviewed. Constitutional: He appears well-developed and well-nourished. No distress.  Eyes: Pupils are equal, round, and  reactive to light. Conjunctivae are normal.  Cardiovascular: Normal rate, regular rhythm and normal heart sounds.  Respiratory: Effort normal and breath sounds normal. No respiratory distress. He has no wheezes.  Musculoskeletal:  No tremor  Skin: Skin is warm and dry.  Iv track marks b/l anticubital fossa no erythema or fluctulance, multiple tattoos. No piloerection, no sweating  Psychiatric: He has a normal mood and affect.  Occasionally looses train of thought   COWS-3  Assessment/Plan:  See Problem Based Charting in the Encounters Tab     Gust RungHoffman, Kerianne Gurr C, DO  10/04/2017  11:17 AM

## 2017-10-05 ENCOUNTER — Ambulatory Visit: Payer: Self-pay | Admitting: Physician Assistant

## 2017-10-05 ENCOUNTER — Encounter: Payer: Self-pay | Admitting: Physician Assistant

## 2017-10-05 VITALS — BP 128/85 | HR 125 | Temp 98.1°F | Ht 62.5 in | Wt 157.8 lb

## 2017-10-05 DIAGNOSIS — Z13220 Encounter for screening for lipoid disorders: Secondary | ICD-10-CM

## 2017-10-05 DIAGNOSIS — R7989 Other specified abnormal findings of blood chemistry: Secondary | ICD-10-CM

## 2017-10-05 DIAGNOSIS — Z8639 Personal history of other endocrine, nutritional and metabolic disease: Secondary | ICD-10-CM

## 2017-10-05 DIAGNOSIS — Z7689 Persons encountering health services in other specified circumstances: Secondary | ICD-10-CM

## 2017-10-05 DIAGNOSIS — F1910 Other psychoactive substance abuse, uncomplicated: Secondary | ICD-10-CM

## 2017-10-05 DIAGNOSIS — D696 Thrombocytopenia, unspecified: Secondary | ICD-10-CM

## 2017-10-05 DIAGNOSIS — M48061 Spinal stenosis, lumbar region without neurogenic claudication: Secondary | ICD-10-CM

## 2017-10-05 DIAGNOSIS — M4646 Discitis, unspecified, lumbar region: Secondary | ICD-10-CM

## 2017-10-05 DIAGNOSIS — M545 Low back pain: Secondary | ICD-10-CM

## 2017-10-05 DIAGNOSIS — F319 Bipolar disorder, unspecified: Secondary | ICD-10-CM

## 2017-10-05 DIAGNOSIS — R945 Abnormal results of liver function studies: Secondary | ICD-10-CM

## 2017-10-05 DIAGNOSIS — K219 Gastro-esophageal reflux disease without esophagitis: Secondary | ICD-10-CM

## 2017-10-05 DIAGNOSIS — G8929 Other chronic pain: Secondary | ICD-10-CM

## 2017-10-05 DIAGNOSIS — B1920 Unspecified viral hepatitis C without hepatic coma: Secondary | ICD-10-CM

## 2017-10-05 MED ORDER — CYCLOBENZAPRINE HCL 10 MG PO TABS: 10.0000 mg | ORAL_TABLET | Freq: Three times a day (TID) | ORAL | 0 refills | Status: DC | PRN

## 2017-10-05 MED ORDER — OMEPRAZOLE 40 MG PO CPDR: 40.0000 mg | DELAYED_RELEASE_CAPSULE | Freq: Every day | ORAL | 3 refills | Status: DC

## 2017-10-05 NOTE — Progress Notes (Signed)
BP 128/85 (BP Location: Right Arm, Patient Position: Sitting, Cuff Size: Normal)   Pulse (!) 125   Temp 98.1 F (36.7 C)   Ht 5' 2.5" (1.588 m)   Wt 157 lb 12 oz (71.6 kg)   SpO2 96%   BMI 28.39 kg/m    Subjective:    Patient ID: Joshua Thomas, male    DOB: 09/22/1975, 42 y.o.   MRN: [id number]  HPI: Joshua Thomas is a 42 y.o. male presenting on 10/05/2017 for New Patient (Initial Visit)   HPI   Pt presents to establish care as a new patient.  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  Pt was in hospital earlier this summer for 7 weeks with progressive discitis.  Most recent recommendation to follow up with orthospine or neurosurgery.   Pt was seen yesterday and given suboxone by dr Mikey Bussing.    Recent a1c on 06/13/17 was 6.6.  Two years ago it was 5.8.  Pt says he is not diabetic.    Pt has appointment with neurosurgeon Marikay Alar) sometime in October but he doesn't know if he is going because he has to take $200 that he does not have.  He is going to "Dr Merlyn Albert" for his bipolar in [place] at [facility].     It's been a while since he's been.  He's out of his seroquel.  He is very anxious  Pt says that he is 26 days clean  Pt says he has applied for medicaid but hasn't gotten an answer yet.   Pt did not bring his medications with him today and doesn't know what he is taking. Pt is a poor historian and does not finish one thought before taking off on a different thought.    Relevant past medical, surgical, family and social history reviewed and updated as indicated. Interim medical history since our last visit reviewed. Allergies and medications reviewed and updated.  CURRENT MEDS: suboxone OTC omeprazole ?   Review of Systems   Constitutional: Positive for appetite change. Negative for chills, diaphoresis, fatigue, fever and unexpected weight change.  HENT: Negative for congestion, dental problem, drooling, ear pain, facial swelling, hearing loss, mouth sores, sneezing, sore throat, trouble swallowing and voice change.   Eyes: Negative for pain, discharge, redness, itching and visual disturbance.  Respiratory: Negative for cough, choking, shortness of breath and wheezing.   Cardiovascular: Negative for chest pain, palpitations and leg swelling.  Gastrointestinal: Negative for abdominal pain, blood in stool, constipation, diarrhea and vomiting.  Endocrine: Negative for cold intolerance, heat intolerance and polydipsia.  Genitourinary: Negative for decreased urine volume, dysuria and hematuria.  Musculoskeletal: Positive for arthralgias and back pain. Negative for gait problem.  Skin: Negative for rash.  Allergic/Immunologic: Negative for environmental allergies.  Neurological: Negative for seizures, syncope, light-headedness and headaches.  Hematological: Negative for adenopathy.  Psychiatric/Behavioral: Positive for agitation and dysphoric mood. Negative for suicidal ideas. The patient is nervous/anxious.     Per HPI unless specifically indicated above     Objective:    BP 128/85 (BP Location: Right Arm, Patient Position: Sitting, Cuff Size: Normal)   Pulse (!) 125   Temp 98.1 F (36.7 C)   Ht 5' 2.5" (1.588 m)   Wt 157 lb 12 oz (71.6 kg)   SpO2 96%   BMI 28.39 kg/m  Wt Readings from Last 3 Encounters:  10/05/17 157 lb 12 oz (71.6 kg)  10/04/17 154 lb 14.4 oz (70.3 kg)  09/23/17 154 lb 5.2 oz (70 kg)    Physical Exam  Constitutional: He is oriented to person, place, and time. He appears well-developed and well-nourished.  HENT:  Head: Normocephalic and atraumatic.  Mouth/Throat: Uvula is midline and oropharynx is clear and moist. Abnormal dentition. No oropharyngeal exudate.  Eyes: Pupils are  equal, round, and reactive to light. Conjunctivae and EOM are normal.  Neck: Neck supple. No thyromegaly present.  Cardiovascular: Normal rate and regular rhythm.  Pulmonary/Chest: Effort normal and breath sounds normal. He has no wheezes. He has no rales.  Abdominal: Soft. Bowel sounds are normal. He exhibits no mass. There is no hepatosplenomegaly. There is no tenderness.  Musculoskeletal: He exhibits no edema.  Lymphadenopathy:    He has no cervical adenopathy.  Neurological: He is alert and oriented to person, place, and time.  Skin: Skin is warm and dry. No rash noted.  Psychiatric: Thought content normal. His mood appears anxious. His speech is tangential. He is hyperactive.  Vitals reviewed.       Assessment & Plan:    Encounter Diagnoses  Name Primary?  . Encounter to establish care Yes  . Polysubstance abuse (HCC)   . Elevated LFTs   . Thrombocytopenia (HCC)   . History of diabetes mellitus   . Hepatitis C virus infection without hepatic coma, unspecified chronicity   . Screening cholesterol level   . Bipolar affective disorder, remission status unspecified (HCC)   . Gastroesophageal reflux disease, esophagitis presence not specified   . Discitis of lumbar region   . Spinal stenosis of lumbar region, unspecified whether neurogenic claudication present   . Chronic low back pain, unspecified back pain laterality, with sciatica presence unspecified     -will Refer to dr Luciana Axe for hepatitis C  -pt to Continue with dr Mikey Bussing for suboxone  -Pt counseled to return to [facility] soon for his bipolar and substance abuse  -Check lipid panel, a1c  -rx Omeprazole for GERD  -will refer to neurosurgeon at tertiary care facility in light of his need for financial assistance.    -pt requested pain medicaiton.  Pt was informed that [facility] does not prescribe controlled substances for pain management.  Pt then requested tramadol.  Explained that tramadol is controlled so he wasn't  getting that either.  Pt declined offer for diclofenac.  Pt requested flexeril.  He will be given 1 rx for this.  Explained to pt that it is a one off that we are not a pain clinic.  Will Refer to pain clinic for chronic pain management  -pt counseled to bring all meds to every appointment so that we can know what he is taking  -pt was given application for [facility] [employer]  -pt to follow up 1 month.  RTO sooner prn

## 2017-10-05 NOTE — Patient Instructions (Addendum)
-  get labs drawn (fasting) -turn in [facility] [employer] application -check on medicaid application -get back in to [facility] -bring all meds to every appointment

## 2017-10-06 ENCOUNTER — Encounter: Payer: Self-pay | Admitting: Physician Assistant

## 2017-10-09 LAB — TOXASSURE SELECT,+ANTIDEPR,UR

## 2017-10-12 ENCOUNTER — Telehealth: Payer: Self-pay | Admitting: *Deleted

## 2017-10-12 NOTE — Telephone Encounter (Signed)
Patient called to ask if he really needs this appointment. Explained what the liver does, how Hep C can affect him and that he may not be having issues at this time but not to wait until it becomes a problem. He advised he will check into getting the orange card and call us back once he gets it.

## 2017-10-18 ENCOUNTER — Telehealth: Payer: Self-pay | Admitting: *Deleted

## 2017-10-18 ENCOUNTER — Ambulatory Visit (INDEPENDENT_AMBULATORY_CARE_PROVIDER_SITE_OTHER): Payer: Self-pay | Admitting: Internal Medicine

## 2017-10-18 VITALS — BP 114/69 | HR 64 | Temp 98.4°F | Resp 20 | Wt 158.9 lb

## 2017-10-18 DIAGNOSIS — F1120 Opioid dependence, uncomplicated: Secondary | ICD-10-CM

## 2017-10-18 MED ORDER — BUPRENORPHINE HCL-NALOXONE HCL 8-2 MG SL SUBL: 1.0000 | SUBLINGUAL_TABLET | Freq: Two times a day (BID) | SUBLINGUAL | 0 refills | Status: DC

## 2017-10-18 MED FILL — BUPRENORPHIN-NALOXON 8-2 MG: 8-2 | 14 days supply | Qty: 28 | Fill #0

## 2017-10-18 NOTE — Assessment & Plan Note (Addendum)
Two weeks ago began induction and doing well. Self titrating dosing using a bit more than what he was prescribed. Some days will use 12 BID or 12 in am and 8 in pm.  Other days will use correct dosage  He ran out of his two weeks supply about two days ago.  Today was having sweats and feels anxious like he may be withdrawing this am, yawning during our interview.  Hasn't felt bad enough to use any heroin or other substances.  He says we will be very happy with his UDS today.  Has been fishing with his son and spending a lot of time with him lately.  Hasn't been to meetings in the last two weeks but before that was going faithfully.    -we will re-prescribe suboxone at his current dosage 8 BID and explained that he should try and use a more consistent dosage and see how he does on this. -gave pt information to follow up at [facility] for hep [facility]  -UDS today -follow up 2 weeks

## 2017-10-18 NOTE — Progress Notes (Signed)
   10/18/2017  Naida Sleight presents for follow up of opioid use disorder I have reviewed the prior induction visit, follow up visits, and telephone encounters relevant to opiate use disorder (OUD) treatment.   Current daily dose: suboxone 12-20  Date of Induction: 10/04/17  Current follow up interval, in weeks: 2 weeks  The patient has been adherent with the buprenorphine for OUD contract.   Last UDS Result: polysubstances present  HPI: Self titrating dosing using a bit more than what he was prescribed. Some days will use 12mg  BID.  He ran out of his two weeks supply about two days ago.  Today was having sweats and feels anxious like he may be withdrawing this am, yawning during our interview.  Hasn't felt bad enough to use any heroin or other substances.  He says we will be very happy with his UDS today.  Has been fishing with his son and spending a lot of time with him lately.  Hasn't been to meetings in the last two weeks but before that was going faithfully.    Exam:   Vitals:   10/18/17 0856  BP: 114/69  Pulse: 64  Resp: 20  Temp: 98.4 F (36.9 C)  TempSrc: Oral  SpO2: 98%  Weight: 158 lb 14.4 oz (72.1 kg)    Cardiac: normal rate and rhythm, clear s1 and s2 Pulmonary: CTAB, not in distress Abdominal: non distended abdomen, soft and nontender Extremities: no LE edema Psych: Alert, conversant, in good spirits    Assessment/Plan:  See Problem Based Charting in the Encounters Tab    Angelita Ingles, MD  10/18/2017  9:42 AM

## 2017-10-18 NOTE — Telephone Encounter (Signed)
Gave pt's information to financial counselor, nenika, she will call him and set appt

## 2017-10-18 NOTE — Patient Instructions (Signed)
Joshua Thomas, I am glad you're doing well.  We will keep your suboxone at the same dose for now.  Try to keep a consistent dosage at home as we discussed.  I have attached the number to infectious disease as requested.  Please follow up in 2 weeks.    [address], [place], [place] [address] [phone]

## 2017-10-19 NOTE — Progress Notes (Signed)
Internal Medicine Clinic Attending  I saw and evaluated the patient.  I personally confirmed the key portions of the history and exam documented by Dr. Frances Furbish and I reviewed pertinent patient test results.  The assessment, diagnosis, and plan were formulated together and I agree with the documentation in the resident's note.  Improving opioid use disorder on suboxone. We talked about the need to improve consistency with dosing in the future, which will help with his cravings and avoid running out early. Will continue with 16mg  daily, follow up in 2 weeks.

## 2017-10-24 LAB — TOXASSURE SELECT,+ANTIDEPR,UR

## 2017-11-09 ENCOUNTER — Ambulatory Visit: Payer: Self-pay | Admitting: Physician Assistant

## 2017-11-15 ENCOUNTER — Other Ambulatory Visit: Payer: Self-pay | Admitting: *Deleted

## 2017-11-15 MED ORDER — BUPRENORPHINE HCL-NALOXONE HCL 8-2 MG SL SUBL: 1.0000 | SUBLINGUAL_TABLET | Freq: Two times a day (BID) | SUBLINGUAL | 0 refills | Status: DC

## 2017-11-15 NOTE — Telephone Encounter (Signed)
Will provide 1 week refill to get to next week's appointment

## 2017-11-24 ENCOUNTER — Emergency Department (HOSPITAL_COMMUNITY): Payer: Self-pay

## 2017-11-24 ENCOUNTER — Encounter (HOSPITAL_COMMUNITY): Payer: Self-pay

## 2017-11-24 ENCOUNTER — Emergency Department (HOSPITAL_COMMUNITY)
Admission: EM | Admit: 2017-11-24 | Discharge: 2017-11-24 | Disposition: A | Discharge status: Home / Self Care | Payer: Self-pay | Attending: Emergency Medicine | Admitting: Emergency Medicine

## 2017-11-24 ENCOUNTER — Encounter: Payer: Self-pay | Admitting: Physician Assistant

## 2017-11-24 ENCOUNTER — Other Ambulatory Visit: Payer: Self-pay

## 2017-11-24 DIAGNOSIS — R079 Chest pain, unspecified: Secondary | ICD-10-CM | POA: Insufficient documentation

## 2017-11-24 DIAGNOSIS — M5440 Lumbago with sciatica, unspecified side: Secondary | ICD-10-CM

## 2017-11-24 DIAGNOSIS — F329 Major depressive disorder, single episode, unspecified: Secondary | ICD-10-CM | POA: Insufficient documentation

## 2017-11-24 DIAGNOSIS — F1910 Other psychoactive substance abuse, uncomplicated: Secondary | ICD-10-CM

## 2017-11-24 DIAGNOSIS — R220 Localized swelling, mass and lump, head: Secondary | ICD-10-CM | POA: Insufficient documentation

## 2017-11-24 DIAGNOSIS — Z79899 Other long term (current) drug therapy: Secondary | ICD-10-CM | POA: Insufficient documentation

## 2017-11-24 DIAGNOSIS — I10 Essential (primary) hypertension: Secondary | ICD-10-CM | POA: Insufficient documentation

## 2017-11-24 DIAGNOSIS — J449 Chronic obstructive pulmonary disease, unspecified: Secondary | ICD-10-CM | POA: Insufficient documentation

## 2017-11-24 DIAGNOSIS — R45851 Suicidal ideations: Secondary | ICD-10-CM | POA: Insufficient documentation

## 2017-11-24 DIAGNOSIS — R030 Elevated blood-pressure reading, without diagnosis of hypertension: Secondary | ICD-10-CM

## 2017-11-24 DIAGNOSIS — F17210 Nicotine dependence, cigarettes, uncomplicated: Secondary | ICD-10-CM | POA: Insufficient documentation

## 2017-11-24 DIAGNOSIS — F1494 Cocaine use, unspecified with cocaine-induced mood disorder: Secondary | ICD-10-CM | POA: Insufficient documentation

## 2017-11-24 DIAGNOSIS — M545 Low back pain: Secondary | ICD-10-CM | POA: Insufficient documentation

## 2017-11-24 DIAGNOSIS — G8929 Other chronic pain: Secondary | ICD-10-CM

## 2017-11-24 DIAGNOSIS — E119 Type 2 diabetes mellitus without complications: Secondary | ICD-10-CM | POA: Insufficient documentation

## 2017-11-24 DIAGNOSIS — F1114 Opioid abuse with opioid-induced mood disorder: Secondary | ICD-10-CM | POA: Insufficient documentation

## 2017-11-24 LAB — COMPREHENSIVE METABOLIC PANEL
ALT: 49 U/L — AB (ref 0–44)
ANION GAP: 12 (ref 5–15)
AST: 39 U/L (ref 15–41)
Albumin: 3.7 g/dL (ref 3.5–5.0)
Alkaline Phosphatase: 46 U/L (ref 38–126)
BUN: 9 mg/dL (ref 6–20)
CHLORIDE: 103 mmol/L (ref 98–111)
CO2: 24 mmol/L (ref 22–32)
Calcium: 8.3 mg/dL — ABNORMAL LOW (ref 8.9–10.3)
Creatinine, Ser: 0.71 mg/dL (ref 0.61–1.24)
Glucose, Bld: 130 mg/dL — ABNORMAL HIGH (ref 70–99)
POTASSIUM: 3.4 mmol/L — AB (ref 3.5–5.1)
SODIUM: 139 mmol/L (ref 135–145)
Total Bilirubin: 0.5 mg/dL (ref 0.3–1.2)
Total Protein: 7.2 g/dL (ref 6.5–8.1)

## 2017-11-24 LAB — RAPID URINE DRUG SCREEN, HOSP PERFORMED
Amphetamines: POSITIVE — AB
Barbiturates: NOT DETECTED
Benzodiazepines: POSITIVE — AB
COCAINE: POSITIVE — AB
OPIATES: POSITIVE — AB
Tetrahydrocannabinol: NOT DETECTED

## 2017-11-24 LAB — CBC
HCT: 40.9 % (ref 39.0–52.0)
HEMOGLOBIN: 13 g/dL (ref 13.0–17.0)
MCH: 31.4 pg (ref 26.0–34.0)
MCHC: 31.8 g/dL (ref 30.0–36.0)
MCV: 98.8 fL (ref 80.0–100.0)
Platelets: 130 10*3/uL — ABNORMAL LOW (ref 150–400)
RBC: 4.14 MIL/uL — AB (ref 4.22–5.81)
RDW: 14.8 % (ref 11.5–15.5)
WBC: 5.6 10*3/uL (ref 4.0–10.5)
nRBC: 0 % (ref 0.0–0.2)

## 2017-11-24 MED ORDER — METHOCARBAMOL 500 MG PO TABS
1000.0000 mg | ORAL_TABLET | Freq: Once | ORAL | Status: AC
  Administered 2017-11-24: 1000 mg via ORAL
  Filled 2017-11-24: qty 2

## 2017-11-24 MED ORDER — GADOBUTROL 1 MMOL/ML IV SOLN
7.0000 mL | Freq: Once | INTRAVENOUS | Status: AC | PRN
  Administered 2017-11-24: 7 mL via INTRAVENOUS

## 2017-11-24 MED ORDER — KETOROLAC TROMETHAMINE 60 MG/2ML IM SOLN
30.0000 mg | Freq: Once | INTRAMUSCULAR | Status: AC
  Administered 2017-11-24: 30 mg via INTRAMUSCULAR
  Filled 2017-11-24: qty 2

## 2017-11-24 MED ORDER — HYDROMORPHONE HCL 1 MG/ML IJ SOLN
1.0000 mg | Freq: Once | INTRAMUSCULAR | Status: AC
  Administered 2017-11-24: 1 mg via INTRAVENOUS
  Filled 2017-11-24: qty 1

## 2017-11-24 MED ORDER — HYDROCHLOROTHIAZIDE 25 MG PO TABS: 12.5000 mg | ORAL_TABLET | Freq: Every day | ORAL | 0 refills | Status: DC

## 2017-11-24 MED ORDER — HYDROCHLOROTHIAZIDE 12.5 MG PO CAPS
12.5000 mg | ORAL_CAPSULE | Freq: Once | ORAL | Status: AC
  Administered 2017-11-24: 12.5 mg via ORAL
  Filled 2017-11-24: qty 1

## 2017-11-24 MED ORDER — LIDOCAINE 5 % EX PTCH
1.0000 | MEDICATED_PATCH | CUTANEOUS | Status: DC
  Administered 2017-11-24: 1 via TRANSDERMAL
  Filled 2017-11-24: qty 1

## 2017-11-24 MED ORDER — DIPHENHYDRAMINE HCL 50 MG/ML IJ SOLN
25.0000 mg | Freq: Once | INTRAMUSCULAR | Status: DC
  Filled 2017-11-24: qty 1

## 2017-11-24 MED ORDER — ACETAMINOPHEN 325 MG PO TABS
650.0000 mg | ORAL_TABLET | Freq: Once | ORAL | Status: AC
  Administered 2017-11-24: 650 mg via ORAL
  Filled 2017-11-24: qty 2

## 2017-11-24 MED ORDER — LORAZEPAM 2 MG/ML IJ SOLN
1.0000 mg | Freq: Once | INTRAMUSCULAR | Status: AC | PRN
  Administered 2017-11-24: 1 mg via INTRAVENOUS
  Filled 2017-11-24: qty 1

## 2017-11-24 MED ORDER — NICOTINE 21 MG/24HR TD PT24
21.0000 mg | MEDICATED_PATCH | Freq: Once | TRANSDERMAL | Status: DC
  Administered 2017-11-24: 21 mg via TRANSDERMAL
  Filled 2017-11-24: qty 1
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
## 2017-11-24 NOTE — BH Assessment (Signed)
[facility]     Patient has been psych cleared by Roosvelt HarpsJackie Norman, DO.  Patient does not meet inpatient admission criteria. Peer Support and Social Work to consult with patient prior to D/C from ED.

## 2017-11-24 NOTE — ED Notes (Signed)
ED Provider at bedside. 

## 2017-11-24 NOTE — ED Notes (Signed)
Patient transported to CT 

## 2017-11-24 NOTE — ED Notes (Signed)
Pt is requesting to talk to a social worker about trying to get him somewhere safe due to being unable to go back to his sister house

## 2017-11-24 NOTE — ED Notes (Signed)
Patient given graham crackers and coke 
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
## 2017-11-24 NOTE — BH Assessment (Addendum)
[facility]  Per Juanetta BeetsJacqueline Norman, DO, this pt does not require psychiatric hospitalization at this time.  He is psychiatrically cleared.  Discharge instructions include referral information for area substance abuse treatment providers, as well as area providers of services for the homeless.  Pt would also benefit from seeing Peer Support Specialists; they will be asked to speak to pt.  Pt's nurse has been notified.  Doylene Canninghomas Stephane Niemann, MA Triage Specialist [phone]

## 2017-11-24 NOTE — Discharge Instructions (Addendum)
You had elevated blood pressure in the department. I have prescribed you HCTZ a blood pressure medication. Please follow up with your PCP for reevaluation.  To help you maintain a sober lifestyle, a substance abuse treatment program may be beneficial to you.  Contact one of the following facilities at your earliest opportunity to ask about enrolling:  RESIDENTIAL PROGRAMS:       [facility]      [address]      [place], [place] [address]      [phone]       [facility]      [address]      [place], [place] [address]      [phone]  OUTPATIENT PROGRAMS:       [facility] [address] [place]      [address] [place]      [place], [place] [address]      [phone]  For your shelter needs, contact the following service providers:       [facility]      [address]      [place], [place] [address]      [phone]       [facility]      [address]      [place], [place] [address]      [phone]  For day shelter and other supportive services for the homeless, contact the [facility]):       [facility]      [address]      [place], [place] [address]      [phone]  For transitional housing, contact one of the following agencies.  They provide longer term housing than a shelter, but there is an application process:       [facility]      [address]      [place], [place] [address]      [phone]       [facility]      [address]      [place], [place] [address]      [phone]) [phone]

## 2017-11-24 NOTE — Patient Outreach (Signed)
ED Peer Support Specialist Patient Intake (Complete at intake & 30-60 Day Follow-up)  Name: Joshua Thomas  MRN: [id number]  Age: 42 y.o.   Date of Admission: 11/24/2017  Intake: Initial Comments:      Primary Reason Admitted: Substance Abuse.   Lab values: Alcohol/ETOH: Negative Positive UDS? Yes Amphetamines: Yes Barbiturates: No Benzodiazepines: Yes Cocaine: Yes Opiates: Yes Cannabinoids: No  Demographic information: Gender: Male Ethnicity: White Marital Status: Single Insurance Status: Uninsured/Self-pay Ecologist (Work Neurosurgeon, Physicist, medical, etc.: [employer]) Lives with: Partner/Spouse Living situation: House/Apartment  Reported Patient History: Patient reported health conditions: Bipolar disorder, Anxiety disorders, Depression Patient aware of HIV and hepatitis status: Yes (comment)(Hep C)  In past year, has patient visited ED for any reason? Yes  Number of ED visits: 12  Reason(s) for visit: Same situation  In past year, has patient been hospitalized for any reason? No  Number of hospitalizations:    Reason(s) for hospitalization:    In past year, has patient been arrested? No  Number of arrests:    Reason(s) for arrest:    In past year, has patient been incarcerated? No  Number of incarcerations:    Reason(s) for incarceration:    In past year, has patient received medication-assisted treatment? Yes, [facility]  In past year, patient received the following treatments:    In past year, has patient received any harm reduction services? No  Did this include any of the following?    In past year, has patient received care from a mental health provider for diagnosis other than SUD? No  In past year, is this first time patient has overdosed? No  Number of past overdoses:    In past year, is this first time patient has been hospitalized for an overdose? No  Number of hospitalizations for  overdose(s):    Is patient currently receiving treatment for a mental health diagnosis? No  Patient reports experiencing difficulty participating in SUD treatment: No    Most important reason(s) for this difficulty?    Has patient received prior services for treatment? No  In past, patient has received services from following agencies:    Plan of Care:  Suggested follow up at these agencies/treatment centers: [facility] services )  Other information: CPSS met with Pt and was made aware that this ED visit he wants to seek help. CPSS processed with Pt to gain information to better assist Pt with services. CPSS processed with Pt an discussed a few options that may help Pt at the time. Pt signed consent form for CPSS to fax off information to [facility] for treatment and are waiting for reply at this time.   Aaron Edelman Shawn Carattini, CPSS  11/24/2017 12:24 PM

## 2017-11-24 NOTE — ED Notes (Signed)
Joshua Thomas the CPSS was called for an update about [facility] admission. Joshua Thomas reported that [facility] had no yet reached a decision, and he was on his way to meet with the pt again.
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
## 2017-11-24 NOTE — ED Notes (Signed)
Pt speaking with social work. Pt requested nicotine patch and pain meds for back.
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
## 2017-11-24 NOTE — Progress Notes (Signed)
CSW aware of consult. CSW spoke with patient at bedside regarding resources. Patient reported that he's had a "tough couple of weeks". Per patient, he was living at his sister's house and that's where he was doing his drugs. Patient reported he would not be going back to live with his sister and stated he had no where to live. CSW offered patient homeless shelter resources, food resources and [facility]. Patient did not seem interested as he was focused on substance use treatment. CSW left resources with patient and updated CPSS who agreed to meet with patient. No further CSW needs at this time, please reconsult if needs arise.  Archie BalboaMackenzie Irwin, LCSWA  Clinical Social Work Department  [facility]  [phone]

## 2017-11-24 NOTE — ED Notes (Signed)
Pt requesting pain meds and something to eat. PA made aware. PA to come see pt.

## 2017-11-24 NOTE — ED Notes (Signed)
PA notified pt requesting nicotine patch. See orders

## 2017-11-24 NOTE — Patient Outreach (Signed)
CPSS was made aware from [facility] that he was not accepted to the facility for several Medical reasons. CPSS addressed the fact that CPSS was made aware that because of his health issues they denied Pt. CPSS John issued [facility] information an for a detox facility [place] with directions and bus passes to assist Pt. CPSS left contact information for Pt to follow up with CPSS if need in the community.
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
## 2017-11-24 NOTE — ED Notes (Signed)
Patient transported to MRI 

## 2017-11-24 NOTE — ED Notes (Addendum)
Registration called back to this nurse stating that the pt was in the lobby requesting to come back in. Pt left the department without notifying staff. Pt still had IV in place. Upon return to the department IV was removed by Irving BurtonEmily RN due to pt being a flight risk and known dependency to numerous substances.

## 2017-11-24 NOTE — ED Notes (Signed)
[facility]

## 2017-11-24 NOTE — ED Notes (Signed)
TTS at bedside. 

## 2017-11-26 ENCOUNTER — Other Ambulatory Visit: Payer: Self-pay

## 2017-11-26 ENCOUNTER — Encounter (HOSPITAL_COMMUNITY): Payer: Self-pay | Admitting: Emergency Medicine

## 2017-11-26 DIAGNOSIS — I10 Essential (primary) hypertension: Secondary | ICD-10-CM | POA: Insufficient documentation

## 2017-11-26 DIAGNOSIS — F17210 Nicotine dependence, cigarettes, uncomplicated: Secondary | ICD-10-CM | POA: Insufficient documentation

## 2017-11-26 DIAGNOSIS — Z79899 Other long term (current) drug therapy: Secondary | ICD-10-CM | POA: Insufficient documentation

## 2017-11-26 DIAGNOSIS — F1910 Other psychoactive substance abuse, uncomplicated: Secondary | ICD-10-CM | POA: Insufficient documentation

## 2017-11-26 DIAGNOSIS — J449 Chronic obstructive pulmonary disease, unspecified: Secondary | ICD-10-CM | POA: Insufficient documentation

## 2017-11-26 DIAGNOSIS — Z9981 Dependence on supplemental oxygen: Secondary | ICD-10-CM | POA: Insufficient documentation

## 2017-11-26 NOTE — ED Triage Notes (Signed)
Pt reports having dental pain that has been ongoing for a month and worsening the last several days.

## 2017-11-27 ENCOUNTER — Emergency Department (HOSPITAL_COMMUNITY)
Admission: EM | Admit: 2017-11-27 | Discharge: 2017-11-27 | Disposition: A | Discharge status: Home / Self Care | Payer: Self-pay | Attending: Emergency Medicine | Admitting: Emergency Medicine

## 2017-11-27 DIAGNOSIS — F1910 Other psychoactive substance abuse, uncomplicated: Secondary | ICD-10-CM

## 2017-11-27 MED ORDER — CLONIDINE HCL 0.1 MG PO TABS: 0.1000 mg | ORAL_TABLET | Freq: Three times a day (TID) | ORAL | 0 refills | Status: DC

## 2017-11-27 MED ORDER — CLONIDINE HCL 0.1 MG PO TABS
0.1000 mg | ORAL_TABLET | Freq: Once | ORAL | Status: AC
  Administered 2017-11-27: 0.1 mg via ORAL
  Filled 2017-11-27: qty 1

## 2017-11-27 NOTE — ED Provider Notes (Signed)
[facility]-EMERGENCY DEPT Provider Note  CSN: [id number] Arrival date & time: 11/26/17 2250  Chief Complaint(s) Addiction Problem  HPI Joshua Thomas is a 42 y.o. male with extensive past medical history listed below including polysubstance abuse who presents for addiction problem.  Patient is requesting inpatient rehab for heroin and cocaine use.  Reports that he last used heroin 2 to 3 days ago.  States that he used cocaine last night.  States that he feels a bit agitated.  Reports that he was seen several days ago and evaluated for [facility] admission however he did not meet inpatient criteria at that time.  He was given outpatient resources.  Also reported intermittent dental pain but is currently asymptomatic at this time.  HPI  Past Medical History Past Medical History:  Diagnosis Date  . ADHD (attention deficit hyperactivity disorder)   . Bipolar 1 disorder (HCC)   . Chronic back pain   . Chronic pain    chronic l leg pain  . 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 COPD (chronic obstructive pulmonary disease) (HCC)   . DDD (degenerative disc disease), lumbar   . Degenerative disc disease, lumbar   . Depression   . Discitis   . GERD (gastroesophageal reflux disease)   . Hepatitis C   . Hepatitis C   . History of stomach ulcers   . Hx of diabetes mellitus    due to infection  . Hypertension   . Lung nodules    3 on L and 2 on R  . PTSD (post-traumatic stress disorder)   . Sciatica   . Substance abuse [facility])    Patient Active Problem List   Diagnosis Date Noted  . Essential hypertension 09/23/2017  . Hyperglycemia 06/03/2017  . GERD (gastroesophageal reflux disease) 05/17/2017  . Bipolar disorder (HCC) 05/17/2017  . Opioid use disorder, moderate, dependence (HCC) 05/23/2015  . Chronic hepatitis C virus genotype 1a infection (HCC)    Home Medication(s) Prior to Admission medications   Medication Sig Start Date End Date  Taking? Authorizing Provider  buprenorphine-naloxone (SUBOXONE) 8-2 mg SUBL SL tablet Place 1 tablet under the tongue 2 (two) times daily. 11/15/17   Gust Rung, DO  cloNIDine (CATAPRES) 0.1 MG tablet Take 1 tablet (0.1 mg total) by mouth 3 (three) times daily for 5 days. 11/27/17 12/02/17  Nira Conn, MD  hydrochlorothiazide (HYDRODIURIL) 25 MG tablet Take 0.5 tablets (12.5 mg total) by mouth daily. 11/24/17   Henderly, Britni A, PA-C  ibuprofen (ADVIL,MOTRIN) 800 MG tablet Take 800 mg by mouth every 8 (eight) hours as needed.    [provider]  lidocaine (LIDODERM) 5 % Place 1 patch onto the skin daily. Remove & Discard patch within 12 hours or as directed by MD. Apply to mid lower back. Patient not taking: Reported on 11/24/2017 06/24/17   Elease Etienne, MD  omeprazole (PRILOSEC) 40 MG capsule Take 1 capsule (40 mg total) by mouth daily. Patient not taking: Reported on 11/24/2017 10/05/17   Jacquelin Hawking, PA-C  QUEtiapine (SEROQUEL) 100 MG tablet Take 100 mg by mouth at bedtime.    [provider]  Past Surgical History Past Surgical History:  Procedure Laterality Date  . FRACTURE SURGERY    . HARDWARE REMOVAL  01/27/2012   Procedure: HARDWARE REMOVAL;  Surgeon: Kathryne Hitch, MD;  Location: [facility] ORS;  Service: Orthopedics;  Laterality: Left;  . IR LUMBAR DISC ASPIRATION W/IMG GUIDE  06/08/2017  . RADIOLOGY WITH ANESTHESIA N/A 06/08/2017   Procedure: DISC ASPIRATION;  Surgeon: Julieanne Cotton, MD;  Location: [facility] OR;  Service: Radiology;  Laterality: N/A;  . TIBIA IM NAIL INSERTION  01/27/2012   Procedure: INTRAMEDULLARY (IM) NAIL TIBIAL;  Surgeon: Kathryne Hitch, MD;  Location: [facility] ORS;  Service: Orthopedics;  Laterality: Left;  Removal of IM Rod and Screws Left Tibia with Exchange Nail, Allograft Bone Graft Left  Tibia   Family History Family History  Problem Relation Age of Onset  . Diabetes Mother     Social History Social History   Tobacco Use  . Smoking status: Current Some Day Smoker    Packs/day: 1.00    Years: 33.00    Pack years: 33.00    Types: E-cigarettes, Cigarettes  . Smokeless tobacco: Former Neurosurgeon    Quit date: 08/08/2006  . Tobacco comment: mostly vapes  Substance Use Topics  . Alcohol use: Yes    Comment: occasionally  . Drug use: Yes    Types: Cocaine, IV    Comment: Pt states he "shoots whatever is available".    Allergies Patient has no known allergies.  Review of Systems Review of Systems All other systems are reviewed and are negative for acute change except as noted in the HPI  Physical Exam Vital Signs  I have reviewed the triage vital signs BP (!) 121/92   Pulse 95   Temp 98.9 F (37.2 C)   Resp 18   Ht 5\' 2"  (1.575 m)   Wt 68 kg   SpO2 100%   BMI 27.44 kg/m   Physical Exam  Constitutional: He is oriented to person, place, and time. He appears well-developed and well-nourished. No distress.  HENT:  Head: Normocephalic and atraumatic.  Nose: Nose normal.  Eyes: Pupils are equal, round, and reactive to light. Conjunctivae and EOM are normal. Right eye exhibits no discharge. Left eye exhibits no discharge. No scleral icterus.  Neck: Normal range of motion. Neck supple.  Cardiovascular: Regular rhythm. Tachycardia present. Exam reveals no gallop and no friction rub.  No murmur heard. Pulmonary/Chest: Effort normal and breath sounds normal. No stridor. No respiratory distress. He has no rales.  Abdominal: Soft. He exhibits no distension. There is no tenderness.  Musculoskeletal: He exhibits no edema or tenderness.  Neurological: He is alert and oriented to person, place, and time.  Skin: Skin is warm and dry. No rash noted. He is not diaphoretic. No erythema.  Psychiatric: He has a normal mood and affect.  Vitals reviewed.   ED Results and  Treatments Labs (all labs ordered are listed, but only abnormal results are displayed) Labs Reviewed - No data to display  EKG  EKG Interpretation  Date/Time:    Ventricular Rate:    PR Interval:    QRS Duration:   QT Interval:    QTC Calculation:   R Axis:     Text Interpretation:        Radiology No results found. Pertinent labs & imaging results that were available during my care of the patient were reviewed by me and considered in my medical decision making (see chart for details).  Medications Ordered in ED Medications  cloNIDine (CATAPRES) tablet 0.1 mg (0.1 mg Oral Given 11/27/17 0519)                                                                                                                                    Procedures Procedures  (including critical care time)  Medical Decision Making / ED Course I have reviewed the nursing notes for this encounter and the patient's prior records (if available in EHR or on provided paperwork).    Polysubstance abuse requesting inpatient rehab.  Patient has already been evaluated and did not meet criteria.  Provided him with clonidine for symptomatic relief of opioid withdrawal.   Patient reported improvement in symptomatology.  Felt comfortable with outpatient follow-up.  Requested the we provided him with the resources again as he lost the paperwork previously.  The patient is safe for discharge with strict return precautions.   Final Clinical Impression(s) / ED Diagnoses Final diagnoses:  Polysubstance abuse (HCC)    Disposition: Discharge  Condition: Good  I have discussed the results, Dx and Tx plan with the patient who expressed understanding and agree(s) with the plan. Discharge instructions discussed at great length. The patient was given strict return precautions who verbalized understanding of  the instructions. No further questions at time of discharge.    ED Discharge Orders         Ordered    cloNIDine (CATAPRES) 0.1 MG tablet  3 times daily     11/27/17 0646          This chart was dictated using voice recognition software.  Despite best efforts to proofread,  errors can occur which can change the documentation meaning.   Nira Connardama, Margaret Staggs Eduardo, MD 11/27/17 [phone]

## 2017-11-27 NOTE — ED Notes (Signed)
Bed: [facility] Expected date:  Expected time:  Means of arrival:  Comments: 

## 2017-11-27 NOTE — ED Notes (Signed)
Called for room x 1.  

## 2017-11-29 ENCOUNTER — Encounter: Payer: Self-pay | Admitting: *Deleted

## 2017-11-29 ENCOUNTER — Ambulatory Visit (INDEPENDENT_AMBULATORY_CARE_PROVIDER_SITE_OTHER): Payer: Self-pay | Admitting: Student in an Organized Health Care Education/Training Program

## 2017-11-29 VITALS — BP 128/83 | HR 87 | Temp 98.2°F | Resp 20 | Wt 150.1 lb

## 2017-11-29 DIAGNOSIS — F1120 Opioid dependence, uncomplicated: Secondary | ICD-10-CM

## 2017-11-29 DIAGNOSIS — Z590 Homelessness: Secondary | ICD-10-CM

## 2017-11-29 MED ORDER — BUPRENORPHINE HCL-NALOXONE HCL 8-2 MG SL SUBL: 1.0000 | SUBLINGUAL_TABLET | Freq: Two times a day (BID) | SUBLINGUAL | 0 refills | Status: DC

## 2017-11-29 MED FILL — BUPRENORPHIN-NALOXON 8-2 MG: 8-2 | 14 days supply | Qty: 28 | Fill #0

## 2017-11-29 NOTE — Progress Notes (Signed)
   11/29/2017  Naida SleightLee W Batch presents for follow up of opioid use disorder I have reviewed the prior induction visit, follow up visits, and telephone encounters relevant to opiate use disorder (OUD) treatment.    HPI: 42 year old man here for follow-up of severe opioid use disorder.  Last saw us in October, no showed for his 2-week follow-up.  Says he tried to come off the Suboxone on his own because he needed more.  He is doing well.  Got a call from his mother, and says that was his gave way.  Started going around friends who are still using.  This led to him relapsing and started to use injection heroin again.  Subsequently has lost the car, it was towed away.  [place] on housed[place]  Slept last few nights and was too long waiting room.  Struggling, has no support.  [facility] but says that they declined inpatient rehab because he had other medical issues.  [facility] but they also declined inpatient admission.  He was given outpatient resources.  Back here to get back on Suboxone.  Denies any fevers or chills.  His low back pain has been stable, no signs of recurrent osteomyelitis. Denies selling suboxone, says no one wants to buy the tablets anyways, only the films.  Exam:   Vitals:   11/29/17 0903  BP: 128/83  Pulse: 87  Resp: 20  Temp: 98.2 F (36.8 C)  TempSrc: Oral  SpO2: 100%  Weight: 150 lb 1.6 oz (68.1 kg)    Gen: well appearing man, no distress CV: regular, no murmurs Lungs: unlabored, clear throughout Skin: no soft tissue infections, no recent injection marks  Assessment/Plan:  See Problem Based Charting in the Encounters Tab     Tyson AliasVincent, Sorcha Rotunno Thomas, MD  11/29/2017  9:27 AM

## 2017-11-29 NOTE — Assessment & Plan Note (Signed)
Currently uncontrolled.  Recent relapse.  Wants to get back on treatments.  Unable to go to inpatient rehab because of other medical conditions.  Her condition is complicated by on housed social situation, no social support, no insurance.  Plan is to restart Suboxone generic tablets 16 mg daily divided doses.  Sent a 2-week supply to the outpatient pharmacy.  Says he cannot afford the cash price there.  Follow-up in 2 weeks.  We set a goal for better consistency with taking medication.  And to follow-up with us closely.

## 2017-11-29 NOTE — Progress Notes (Signed)
Patient had 30 minute discussion with triage RN about how to get his 2 week prescription for Suboxone ordered by Dr. Oswaldo DoneVincent and sent to the [facility]. Pt reports he told Dr. Oswaldo DoneVincent that he could afford his medicine for 2 weeks and would return for his next OUD appt on Dec. 3. Meanwhile, after his OUD appt today with Dr. Oswaldo DoneVincent, patient had discussion with triage nurse about getting money to pay for his Rx. Pt told this RN that he had told Dr. Oswaldo DoneVincent he could afford to pay for his medicine because his mother would pay for it. However, [place] in [place]. Patient states triage nurse said clinic could provide a one time medicine payment of $25 today. I contacted Dr. Oswaldo DoneVincent to ask if he intended for the clinic to provide the medication money for the patient and Dr. Oswaldo DoneVincent indicated he did not expect the clinic to pay, that the patient said he could make arrangements to pay for it.  Patient stated the [facility] pharmacy would not take his mother's credit card over the phone, according to the triage nurse who called the pharmacy for him. I suggested the patient call the pharmacy himself and ask them what form of remote payment they could accept and then he could tell his mother what he needed. We discussed wiring money, Venmo, and other electronic options for her to send the needed money to the pharmacy for him. I offered to help walk the patient to the pharmacy to help him if needed after he called them to find out what his mother needed to do and gave the patient the out-patient pharmacy phone number to call from the [facility] lobby himself. When I checked back with the patient approximately 15 minutes later, he had left the clinic. I left messages with the current triage nurse and front office staff to notify this RN if patient returned and needed further assistance communicating with the pharmacy and his mother. Dorie RankSharon Chai Verdejo, RN, 11/29/17,3:41 P

## 2017-11-29 NOTE — Patient Instructions (Signed)
We set a goal to improve consistency with taking the Suboxone twice a day, every day.  Follow-up with us in 2 weeks.

## 2017-12-13 ENCOUNTER — Ambulatory Visit (INDEPENDENT_AMBULATORY_CARE_PROVIDER_SITE_OTHER): Payer: Self-pay | Admitting: Internal Medicine

## 2017-12-13 ENCOUNTER — Other Ambulatory Visit: Payer: Self-pay

## 2017-12-13 VITALS — BP 131/83 | HR 58 | Temp 97.7°F | Ht 63.0 in | Wt 150.6 lb

## 2017-12-13 DIAGNOSIS — Z598 Other problems related to housing and economic circumstances: Secondary | ICD-10-CM

## 2017-12-13 DIAGNOSIS — F1120 Opioid dependence, uncomplicated: Secondary | ICD-10-CM

## 2017-12-13 DIAGNOSIS — F319 Bipolar disorder, unspecified: Secondary | ICD-10-CM

## 2017-12-13 DIAGNOSIS — F3132 Bipolar disorder, current episode depressed, moderate: Secondary | ICD-10-CM

## 2017-12-13 MED ORDER — BUPRENORPHINE HCL-NALOXONE HCL 8-2 MG SL SUBL: 1.0000 | SUBLINGUAL_TABLET | Freq: Two times a day (BID) | SUBLINGUAL | 0 refills | Status: DC

## 2017-12-13 MED FILL — BUPRENORPHIN-NALOXON 8-2 MG: 8-2 | 14 days supply | Qty: 28 | Fill #0

## 2017-12-13 NOTE — Assessment & Plan Note (Signed)
>>  ASSESSMENT AND PLAN FOR BIPOLAR DISORDER (HCC) WRITTEN ON 12/13/2017  2:16 PM BY HOFFMAN, ERIK C, DO  Patient to go for walk in appointment this week to [facility]

## 2017-12-13 NOTE — Patient Instructions (Signed)
I want you to go see [facility].  You will see us back in 2 weeks.

## 2017-12-13 NOTE — Assessment & Plan Note (Signed)
Patient to go for walk in appointment this week to [facility]

## 2017-12-13 NOTE — Assessment & Plan Note (Signed)
I have discussed with him I think would be a good idea for him to move back in with his mother and that he likely will continue to relapse if he continues to associate himself with active drug users.  Want him to get back with the [facility]. However he also scored high on PHQ 9 and he is very depressed I think 1 of the most important things right now is to get him in to see [facility].  He has seen them before and he is willing to go see them.  He told me that he will walk into see them within the next week. We will continue Suboxone tablets 8-2 mg twice daily.  I also discussed with him that if we are unable to get him into a lasting remission we may need to send him to a higher level of care.  UTox obtained today

## 2017-12-13 NOTE — Progress Notes (Signed)
   42/03/2017  Joshua SleightLee W Steidle presents for follow up of opioid use disorder I have reviewed the prior induction visit, follow up visits, and telephone encounters relevant to opiate use disorder (OUD) treatment.    HPI: 42 year old man here for follow-up of severe opioid use disorder.  Joshua Thomas continues to struggle mainly with instability of housing.  He was able to get the Suboxone prescribed by Dr. Oswaldo DoneVincent 2 weeks ago and has been taking that.  He notes that has helped control his back pain and control his cravings.  However he has been staying with various friends many of them to use opioid medications.  He does admit to a relapse last Friday (4 days ago) where he snorted cocaine and heroin he reports that he did not get high off the heroin because of the Suboxone and only did it "because it was free."  He still struggles to make other appointments.  He missed his appointment last month with the [facility].  He plans to call and reschedule an appointment.  He does that he is "burning bridges" with people who are trying to help him.  He is upset with himself about losing his car, he reports to me that his mother had helped him purchase a car but he did not get proper tax for and was stopped by police.  He reports his mother remains a strong advocate for him.  He may try to move back in with her as that is a more positive environment for him to stay clean, is also where he was when he was doing the well a few months ago.  Exam:   Vitals:   12/13/17 1002  BP: 131/83  Pulse: (!) 58  Temp: 97.7 F (36.5 C)  TempSrc: Oral  SpO2: 100%  Weight: 150 lb 9.6 oz (68.3 kg)  Height: 5\' 3"  (1.6 m)    Gen: well appearing man, no distress, appears much more comfortable with movement in regards to his back CV: regular, no murmurs Lungs: unlabored, clear throughout Skin: no soft tissue infections, no recent injection marks   Assessment/Plan:  See Problem Based Charting in the Encounters  Tab     Gust RungHoffman, Erik C, DO  12/13/2017  1:59 PM

## 2017-12-24 LAB — TOXASSURE SELECT,+ANTIDEPR,UR

## 2018-01-16 MED FILL — BUPRENORPHIN-NALOXON 8-2 MG: 8-2 | 14 days supply | Qty: 28 | Fill #0

## 2018-01-20 ENCOUNTER — Emergency Department (HOSPITAL_COMMUNITY): Payer: Self-pay

## 2018-01-20 ENCOUNTER — Other Ambulatory Visit: Payer: Self-pay

## 2018-01-20 ENCOUNTER — Encounter (HOSPITAL_COMMUNITY): Payer: Self-pay | Admitting: *Deleted

## 2018-01-20 ENCOUNTER — Emergency Department (HOSPITAL_COMMUNITY)
Admission: EM | Admit: 2018-01-20 | Discharge: 2018-01-20 | Disposition: A | Discharge status: Home / Self Care | Payer: Self-pay | Attending: Emergency Medicine | Admitting: Emergency Medicine

## 2018-01-20 DIAGNOSIS — R0789 Other chest pain: Secondary | ICD-10-CM | POA: Insufficient documentation

## 2018-01-20 DIAGNOSIS — Z79899 Other long term (current) drug therapy: Secondary | ICD-10-CM | POA: Insufficient documentation

## 2018-01-20 DIAGNOSIS — F1120 Opioid dependence, uncomplicated: Secondary | ICD-10-CM | POA: Insufficient documentation

## 2018-01-20 DIAGNOSIS — R0602 Shortness of breath: Secondary | ICD-10-CM | POA: Insufficient documentation

## 2018-01-20 DIAGNOSIS — F909 Attention-deficit hyperactivity disorder, unspecified type: Secondary | ICD-10-CM | POA: Insufficient documentation

## 2018-01-20 DIAGNOSIS — I10 Essential (primary) hypertension: Secondary | ICD-10-CM | POA: Insufficient documentation

## 2018-01-20 DIAGNOSIS — F17210 Nicotine dependence, cigarettes, uncomplicated: Secondary | ICD-10-CM | POA: Insufficient documentation

## 2018-01-20 DIAGNOSIS — J449 Chronic obstructive pulmonary disease, unspecified: Secondary | ICD-10-CM | POA: Insufficient documentation

## 2018-01-20 DIAGNOSIS — F319 Bipolar disorder, unspecified: Secondary | ICD-10-CM | POA: Insufficient documentation

## 2018-01-20 LAB — BASIC METABOLIC PANEL
ANION GAP: 6 (ref 5–15)
BUN: 16 mg/dL (ref 6–20)
CHLORIDE: 105 mmol/L (ref 98–111)
CO2: 28 mmol/L (ref 22–32)
CREATININE: 0.8 mg/dL (ref 0.61–1.24)
Calcium: 9.2 mg/dL (ref 8.9–10.3)
GFR calc non Af Amer: >60 mL/min (ref >60)
Glucose, Bld: 143 mg/dL — ABNORMAL HIGH (ref 70–99)
Potassium: 4.4 mmol/L (ref 3.5–5.1)
Sodium: 139 mmol/L (ref 135–145)

## 2018-01-20 LAB — CBC
HEMATOCRIT: 46.4 % (ref 39.0–52.0)
Hemoglobin: 14.5 g/dL (ref 13.0–17.0)
MCH: 30.7 pg (ref 26.0–34.0)
MCHC: 31.3 g/dL (ref 30.0–36.0)
MCV: 98.1 fL (ref 80.0–100.0)
NRBC: 0 % (ref 0.0–0.2)
Platelets: 149 10*3/uL — ABNORMAL LOW (ref 150–400)
RBC: 4.73 MIL/uL (ref 4.22–5.81)
RDW: 13.9 % (ref 11.5–15.5)
WBC: 5.5 10*3/uL (ref 4.0–10.5)

## 2018-01-20 LAB — TROPONIN I: Troponin I: <0.03 ng/mL (ref <0.03)

## 2018-01-20 MED ORDER — TIZANIDINE HCL 2 MG PO CAPS: 2.0000 mg | ORAL_CAPSULE | Freq: Three times a day (TID) | ORAL | 0 refills | Status: DC

## 2018-01-20 MED ORDER — KETOROLAC TROMETHAMINE 30 MG/ML IJ SOLN
15.0000 mg | Freq: Once | INTRAMUSCULAR | Status: AC
  Administered 2018-01-20: 15 mg via INTRAVENOUS
  Filled 2018-01-20: qty 1

## 2018-01-20 MED ORDER — SODIUM CHLORIDE 0.9 % IV BOLUS
1000.0000 mL | Freq: Once | INTRAVENOUS | Status: AC
  Administered 2018-01-20: 1000 mL via INTRAVENOUS

## 2018-01-20 MED ORDER — IOPAMIDOL (ISOVUE-370) INJECTION 76%
75.0000 mL | Freq: Once | INTRAVENOUS | Status: AC | PRN
  Administered 2018-01-20: 75 mL via INTRAVENOUS

## 2018-01-20 MED ORDER — PREDNISONE 20 MG PO TABS: 40.0000 mg | ORAL_TABLET | Freq: Every day | ORAL | 0 refills | Status: DC

## 2018-01-20 NOTE — ED Provider Notes (Signed)
[facility] EMERGENCY DEPARTMENT Provider Note   CSN: [id number] Arrival date & time: 01/20/18  1417     History   Chief Complaint Chief Complaint  Patient presents with  . Chest Pain    HPI Joshua Thomas is a 43 y.o. male.  HPI With multiple medical issues presents with pain in a typical distribution, but with no dyspnea, dyspnea on exertion and pleurisy. This change was yesterday, beginning suddenly, since that time he has had difficulty with deep inspiration. He notes that among his history is history of smoking disorder, COPD, and a lung nodule. No report of new fever.  On the pain seems to be diffuse, sore, throughout, and in the thorax worse with inspiration.  Past Medical History:  Diagnosis Date  . ADHD (attention deficit hyperactivity disorder)   . Bipolar 1 disorder (HCC)   . Chronic back pain   . Chronic pain    chronic l leg pain  . 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 COPD (chronic obstructive pulmonary disease) (HCC)   . DDD (degenerative disc disease), lumbar   . Degenerative disc disease, lumbar   . Depression   . Discitis   . GERD (gastroesophageal reflux disease)   . Hepatitis C   . Hepatitis C   . History of stomach ulcers   . Hx of diabetes mellitus    due to infection  . Hypertension   . Lung nodules    3 on L and 2 on R  . PTSD (post-traumatic stress disorder)   . Sciatica   . Substance abuse [facility])     Patient Active Problem List   Diagnosis Date Noted  . Essential hypertension 09/23/2017  . Hyperglycemia 06/03/2017  . GERD (gastroesophageal reflux disease) 05/17/2017  . Bipolar disorder (HCC) 05/17/2017  . Opioid use disorder, moderate, dependence (HCC) 05/23/2015  . Chronic hepatitis C virus genotype 1a infection (HCC)     Past Surgical History:  Procedure Laterality Date  . FRACTURE SURGERY    . HARDWARE REMOVAL  01/27/2012   Procedure: HARDWARE REMOVAL;  Surgeon: Kathryne Hitch, MD;   Location: [facility] ORS;  Service: Orthopedics;  Laterality: Left;  . IR LUMBAR DISC ASPIRATION W/IMG GUIDE  06/08/2017  . RADIOLOGY WITH ANESTHESIA N/A 06/08/2017   Procedure: DISC ASPIRATION;  Surgeon: Julieanne Cotton, MD;  Location: [facility] OR;  Service: Radiology;  Laterality: N/A;  . TIBIA IM NAIL INSERTION  01/27/2012   Procedure: INTRAMEDULLARY (IM) NAIL TIBIAL;  Surgeon: Kathryne Hitch, MD;  Location: [facility];  Service: Orthopedics;  Laterality: Left;  Removal of IM Rod and Screws Left Tibia with Exchange Nail, Allograft Bone Graft Left Tibia        Home Medications    Prior to Admission medications   Medication Sig Start Date End Date Taking? Authorizing Provider  buprenorphine-naloxone (SUBOXONE) 8-2 mg SUBL SL tablet Place 1 tablet under the tongue 2 (two) times daily. 12/13/17   Gust Rung, DO  hydrochlorothiazide (HYDRODIURIL) 25 MG tablet Take 0.5 tablets (12.5 mg total) by mouth daily. 11/24/17   Henderly, Britni A, PA-C  ibuprofen (ADVIL,MOTRIN) 800 MG tablet Take 800 mg by mouth every 8 (eight) hours as needed.    [provider]  predniSONE (DELTASONE) 20 MG tablet Take 2 tablets (40 mg total) by mouth daily with breakfast. For the next four days 01/20/18   Gerhard Munch, MD  QUEtiapine (SEROQUEL) 100 MG tablet Take 100 mg by mouth at bedtime.  [provider]  tizanidine (ZANAFLEX) 2 MG capsule Take 1 capsule (2 mg total) by mouth 3 (three) times daily. 01/20/18   Gerhard MunchLockwood, Denean Pavon, MD    Family History Family History  Problem Relation Age of Onset  . Diabetes Mother     Social History Social History   Tobacco Use  . Smoking status: Current Every Day Smoker    Packs/day: 1.00    Years: 33.00    Pack years: 33.00    Types: E-cigarettes, Cigarettes  . Smokeless tobacco: Former NeurosurgeonUser    Quit date: 08/08/2006  . Tobacco comment: 1 PPD  Substance Use Topics  . Alcohol use: Yes    Comment: occasionally  . Drug use: Yes    Types: Cocaine, IV      Comment: Pt states he "shoots whatever is available".      Allergies   Patient has no known allergies.   Review of Systems Review of Systems  Constitutional:       Per HPI, otherwise negative  HENT:       Per HPI, otherwise negative  Respiratory:       Per HPI, otherwise negative  Cardiovascular:       Per HPI, otherwise negative  Gastrointestinal: Negative for vomiting.  Endocrine:       Negative aside from HPI  Genitourinary:       Neg aside from HPI   Musculoskeletal:       Per HPI, otherwise negative  Skin: Negative.   Neurological: Negative for syncope.  Psychiatric/Behavioral: The patient is nervous/anxious.      Physical Exam Updated Vital Signs BP (!) 144/98 (BP Location: Right Arm)   Pulse 98   Temp 97.7 F (36.5 C) (Oral)   Resp 20   Ht 5\' 3"  (1.6 m)   Wt 72.6 kg   SpO2 100%   BMI 28.34 kg/m   Physical Exam Vitals signs and nursing note reviewed.  Constitutional:      General: He is not in acute distress.    Appearance: He is well-developed.     Comments: Uncomfortable appearing adult male awake and alert  HENT:     Head: Normocephalic and atraumatic.  Eyes:     Conjunctiva/sclera: Conjunctivae normal.  Cardiovascular:     Rate and Rhythm: Regular rhythm. Tachycardia present.  Pulmonary:     Effort: Pulmonary effort is normal. Tachypnea present.     Breath sounds: Decreased breath sounds present.  Abdominal:     General: There is no distension.  Skin:    General: Skin is warm and dry.  Neurological:     Mental Status: He is alert and oriented to person, place, and time.      ED Treatments / Results  Labs (all labs ordered are listed, but only abnormal results are displayed) Labs Reviewed  BASIC METABOLIC PANEL - Abnormal; Notable for the following components:      Result Value   Glucose, Bld 143 (*)    All other components within normal limits  CBC - Abnormal; Notable for the following components:   Platelets 149 (*)    All  other components within normal limits  TROPONIN I    EKG EKG Interpretation  Date/Time:  Friday January 20 2018 14:37:22 EST Ventricular Rate:  95 PR Interval:  134 QRS Duration: 98 QT Interval:  368 QTC Calculation: 462 R Axis:   90 Text Interpretation:  Normal sinus rhythm Rightward axis Incomplete right bundle branch block Abnormal ekg Confirmed by Jeraldine LootsLockwood,  Molly Maduro [phone]) on 01/20/2018 4:51:49 PM   Radiology Dg Chest 2 View  Result Date: 01/20/2018 CLINICAL DATA:  Chest pain. EXAM: CHEST - 2 VIEW COMPARISON:  Chest x-ray dated 11/24/2017 and chest CT dated 01/05/2017 FINDINGS: The heart size and pulmonary vascularity are normal. There is a 15 mm nodule at the right lung base laterally which appears essentially unchanged since the prior CT scan dated 01/05/2017. The lungs are otherwise clear. No effusions. No bone abnormality. IMPRESSION: No acute cardiopulmonary abnormality. Stable nodule at the right lung base. Recommend follow-up chest CT without contrast in 12 months to determine ongoing stability. Electronically Signed   By: Francene Boyers M.D.   On: 01/20/2018 15:03   Ct Angio Chest Pe W/cm &/or Wo Cm  Result Date: 01/20/2018 CLINICAL DATA:  Chest and back pain when breathing in for a ?long time?Marland Kitchen EXAM: CT ANGIOGRAPHY CHEST WITH CONTRAST TECHNIQUE: Multidetector CT imaging of the chest was performed using the standard protocol during bolus administration of intravenous contrast. Multiplanar CT image reconstructions and MIPs were obtained to evaluate the vascular anatomy. CONTRAST:  41mL ISOVUE-370 IOPAMIDOL (ISOVUE-370) INJECTION 76% COMPARISON:  CT 01/05/2017 and CXR 01/20/2018 FINDINGS: Cardiovascular: The study is of quality for the evaluation of pulmonary embolism. There are no filling defects in the central, lobar, segmental or subsegmental pulmonary artery branches to suggest acute pulmonary embolism. Great vessels are normal in course and caliber. Normal heart size. No  significant pericardial fluid/thickening. Nonaneurysmal thoracic aorta without dissection. Mediastinum/Nodes: No discrete thyroid nodules. Unremarkable esophagus. No pathologically enlarged axillary, mediastinal or hilar lymph nodes. Lungs/Pleura: No pneumothorax. No pleural effusion. Slightly smaller rounded pulmonary mass in the left lower lobe adjacent to the major fissure, currently measuring 14 mm versus 18 mm previously, series 6/11. Bibasilar dependent atelectasis is identified. Tiny subpleural blebs are noted along the periphery of the right upper lobe and along the right major fissure. The included liver, spleen, adrenal glands and upper poles of both kidneys are nonacute. Pancreas is unremarkable. There is adjacent splenule near the splenic hilum. Surgical clips project within the spleen. Musculoskeletal:  No aggressive appearing focal osseous lesions. Review of the MIP images confirms the above findings. IMPRESSION: 1. No acute pulmonary embolus.  No active pulmonary disease. 2. Slight decrease in size of previously described left lower lobe pulmonary mass currently measuring 14 mm versus 18 mm previously. Attention on follow-up is recommended to further assure stability as this has already satisfied the three-month follow-up CT recommendations per Fleischner society. If additional evaluation is necessary, PET-CT or percutaneous sampling are other possibilities based on concern. Aortic Atherosclerosis (ICD10-I70.0). Electronically Signed   By: Tollie Eth M.D.   On: 01/20/2018 17:33    Procedures Procedures (including critical care time)  Medications Ordered in ED Medications  sodium chloride 0.9 % bolus 1,000 mL (0 mLs Intravenous Stopped 01/20/18 1842)  ketorolac (TORADOL) 30 MG/ML injection 15 mg (15 mg Intravenous Given 01/20/18 1634)  iopamidol (ISOVUE-370) 76 % injection 75 mL (75 mLs Intravenous Contrast Given 01/20/18 1708)     Initial Impression / Assessment and Plan / ED Course  I  have reviewed the triage vital signs and the nursing notes.  Pertinent labs & imaging results that were available during my care of the patient were reviewed by me and considered in my medical decision making (see chart for details).     6:45 PM Patient awake, alert, we discussed the findings including generally reassuring CT scan, though with his ongoing smoking addiction, patient will follow-up  with outpatient CT or x-ray in 3 to 6 months for repeat evaluation of his mass. No evidence for pulmonary embolism, pneumonia, ACS, some suspicion for respiratory difficulty secondary to his smoking addiction. We discussed this as well. Patient also has worsening back pain, but with a history of chronic back pain, and current use of Suboxone, will start only muscle relaxer, steroids, will follow-up with primary care.   Final Clinical Impressions(s) / ED Diagnoses   Final diagnoses:  Atypical chest pain  SOB (shortness of breath)    ED Discharge Orders         Ordered    predniSONE (DELTASONE) 20 MG tablet  Daily with breakfast     01/20/18 1843    tizanidine (ZANAFLEX) 2 MG capsule  3 times daily     01/20/18 1843           Gerhard MunchLockwood, Dorothyann Mourer, MD 01/20/18 1846

## 2018-01-20 NOTE — ED Notes (Signed)
Pt left AMA without signing out or getting discharge paperwork or RX

## 2018-01-20 NOTE — Discharge Instructions (Signed)
As discussed, your evaluation today has been largely reassuring.  But, it is important that you monitor your condition carefully, and do not hesitate to return to the ED if you develop new, or concerning changes in your condition. ? ?Otherwise, please follow-up with your physician for appropriate ongoing care. ? ?

## 2018-01-20 NOTE — ED Triage Notes (Addendum)
Pt c/o chest pain/back pain when "breathing in" x "long time". Pt unable to tell me how long this has been going on. Pt reports the pain has gotten worse "lately". Pt bouncing around in conversation during triage, unable to stay focused on questions asked.

## 2018-01-30 ENCOUNTER — Other Ambulatory Visit: Payer: Self-pay

## 2018-01-30 ENCOUNTER — Encounter (HOSPITAL_COMMUNITY): Payer: Self-pay

## 2018-01-30 ENCOUNTER — Emergency Department (HOSPITAL_COMMUNITY): Payer: Self-pay

## 2018-01-30 ENCOUNTER — Emergency Department (HOSPITAL_COMMUNITY)
Admission: EM | Admit: 2018-01-30 | Discharge: 2018-01-30 | Disposition: A | Discharge status: Home / Self Care | Payer: Self-pay | Attending: Emergency Medicine | Admitting: Emergency Medicine

## 2018-01-30 DIAGNOSIS — Z79899 Other long term (current) drug therapy: Secondary | ICD-10-CM | POA: Insufficient documentation

## 2018-01-30 DIAGNOSIS — I10 Essential (primary) hypertension: Secondary | ICD-10-CM | POA: Insufficient documentation

## 2018-01-30 DIAGNOSIS — F319 Bipolar disorder, unspecified: Secondary | ICD-10-CM | POA: Insufficient documentation

## 2018-01-30 DIAGNOSIS — M545 Low back pain, unspecified: Secondary | ICD-10-CM

## 2018-01-30 DIAGNOSIS — F17210 Nicotine dependence, cigarettes, uncomplicated: Secondary | ICD-10-CM | POA: Insufficient documentation

## 2018-01-30 DIAGNOSIS — F909 Attention-deficit hyperactivity disorder, unspecified type: Secondary | ICD-10-CM | POA: Insufficient documentation

## 2018-01-30 DIAGNOSIS — J441 Chronic obstructive pulmonary disease with (acute) exacerbation: Secondary | ICD-10-CM | POA: Insufficient documentation

## 2018-01-30 DIAGNOSIS — F1120 Opioid dependence, uncomplicated: Secondary | ICD-10-CM | POA: Insufficient documentation

## 2018-01-30 LAB — CBC WITH DIFFERENTIAL/PLATELET
Abs Immature Granulocytes: 0.03 10*3/uL (ref 0.00–0.07)
Basophils Absolute: 0 10*3/uL (ref 0.0–0.1)
Basophils Relative: 0 %
EOS ABS: 0.1 10*3/uL (ref 0.0–0.5)
EOS PCT: 2 %
HCT: 45.6 % (ref 39.0–52.0)
Hemoglobin: 14.7 g/dL (ref 13.0–17.0)
Immature Granulocytes: 0 %
Lymphocytes Relative: 36 %
Lymphs Abs: 3.2 10*3/uL (ref 0.7–4.0)
MCH: 31.7 pg (ref 26.0–34.0)
MCHC: 32.2 g/dL (ref 30.0–36.0)
MCV: 98.5 fL (ref 80.0–100.0)
MONO ABS: 0.7 10*3/uL (ref 0.1–1.0)
Monocytes Relative: 8 %
Neutro Abs: 4.9 10*3/uL (ref 1.7–7.7)
Neutrophils Relative %: 54 %
Platelets: 161 10*3/uL (ref 150–400)
RBC: 4.63 MIL/uL (ref 4.22–5.81)
RDW: 13.6 % (ref 11.5–15.5)
WBC: 8.9 10*3/uL (ref 4.0–10.5)
nRBC: 0 % (ref 0.0–0.2)

## 2018-01-30 LAB — COMPREHENSIVE METABOLIC PANEL
ALT: 56 U/L — ABNORMAL HIGH (ref 0–44)
AST: 35 U/L (ref 15–41)
Albumin: 4 g/dL (ref 3.5–5.0)
Alkaline Phosphatase: 46 U/L (ref 38–126)
Anion gap: 10 (ref 5–15)
BUN: 20 mg/dL (ref 6–20)
CO2: 21 mmol/L — ABNORMAL LOW (ref 22–32)
Calcium: 9.2 mg/dL (ref 8.9–10.3)
Chloride: 108 mmol/L (ref 98–111)
Creatinine, Ser: 0.83 mg/dL (ref 0.61–1.24)
GFR calc Af Amer: >60 mL/min (ref >60)
GFR calc non Af Amer: >60 mL/min (ref >60)
Glucose, Bld: 85 mg/dL (ref 70–99)
Potassium: 3.6 mmol/L (ref 3.5–5.1)
Sodium: 139 mmol/L (ref 135–145)
Total Bilirubin: 0.9 mg/dL (ref 0.3–1.2)
Total Protein: 7.6 g/dL (ref 6.5–8.1)

## 2018-01-30 LAB — C-REACTIVE PROTEIN: CRP: 0.5 mg/dL (ref <1.0)

## 2018-01-30 LAB — SEDIMENTATION RATE: Sed Rate: 6 mm/hr (ref 0–16)

## 2018-01-30 MED ORDER — KETOROLAC TROMETHAMINE 30 MG/ML IJ SOLN
30.0000 mg | Freq: Once | INTRAMUSCULAR | Status: AC
  Administered 2018-01-30: 30 mg via INTRAVENOUS
  Filled 2018-01-30: qty 1

## 2018-01-30 MED ORDER — METHOCARBAMOL 1000 MG/10ML IJ SOLN
1000.0000 mg | Freq: Once | INTRAVENOUS | Status: AC
  Administered 2018-01-30: 1000 mg via INTRAVENOUS
  Filled 2018-01-30: qty 10

## 2018-01-30 MED ORDER — METHOCARBAMOL 1000 MG/10ML IJ SOLN: 1000.0000 mg | Freq: Once | INTRAMUSCULAR | Status: DC

## 2018-01-30 MED ORDER — GADOBUTROL 1 MMOL/ML IV SOLN
7.0000 mL | Freq: Once | INTRAVENOUS | Status: AC | PRN
  Administered 2018-01-30: 7 mL via INTRAVENOUS

## 2018-01-30 NOTE — ED Triage Notes (Signed)
Pt BIB [facility] with c/o lumbar back pain. +for urinary symptoms, frequency, dark urine, and hx of kidney stones. Main complaint today is back pain. Seen here recently

## 2018-01-30 NOTE — ED Notes (Signed)
Bed: YC14 Expected date:  Expected time:  Means of arrival:  Comments: 67M back pain

## 2018-01-30 NOTE — ED Provider Notes (Signed)
[facility]-EMERGENCY DEPT Provider Note   CSN: [id number] Arrival date & time: 01/30/18  0309     History   Chief Complaint Chief Complaint  Patient presents with  . Back Pain    HPI Joshua Thomas is a 43 y.o. male.  Patient with h/o HTN, T2DM, hepatitis C, GERD, chronic pain, polysubstance abuse, presents with progressive low back pain over the last 2 days. No injury. He reports a history of osteomyelitis in his back that felt the same. He further states that this pain is different than his chronic pain. It is now radiating into the R>L leg without weakness or numbness. Walking is becoming increasingly difficult due to pain. No abdominal pain, fever, nausea, vomiting. He continues to receive/take Suboxone daily and this is not helping his pain. He denies fever.   The history is provided by the patient and the EMS personnel. No language interpreter was used.  Back Pain  Associated symptoms: no abdominal pain, no chest pain, no fever and no numbness     Past Medical History:  Diagnosis Date  . ADHD (attention deficit hyperactivity disorder)   . Bipolar 1 disorder (HCC)   . Chronic back pain   . Chronic pain    chronic l leg pain  . 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 COPD (chronic obstructive pulmonary disease) (HCC)   . DDD (degenerative disc disease), lumbar   . Degenerative disc disease, lumbar   . Depression   . Discitis   . GERD (gastroesophageal reflux disease)   . Hepatitis C   . Hepatitis C   . History of stomach ulcers   . Hx of diabetes mellitus    due to infection  . Hypertension   . Lung nodules    3 on L and 2 on R  . PTSD (post-traumatic stress disorder)   . Sciatica   . Substance abuse [facility])     Patient Active Problem List   Diagnosis Date Noted  . Essential hypertension 09/23/2017  . Hyperglycemia 06/03/2017  . GERD (gastroesophageal reflux disease) 05/17/2017  . Bipolar disorder (HCC)  05/17/2017  . Opioid use disorder, moderate, dependence (HCC) 05/23/2015  . Chronic hepatitis C virus genotype 1a infection (HCC)     Past Surgical History:  Procedure Laterality Date  . FRACTURE SURGERY    . HARDWARE REMOVAL  01/27/2012   Procedure: HARDWARE REMOVAL;  Surgeon: Kathryne Hitch, MD;  Location: [facility];  Service: Orthopedics;  Laterality: Left;  . IR LUMBAR DISC ASPIRATION W/IMG GUIDE  06/08/2017  . RADIOLOGY WITH ANESTHESIA N/A 06/08/2017   Procedure: DISC ASPIRATION;  Surgeon: Julieanne Cotton, MD;  Location: [facility] OR;  Service: Radiology;  Laterality: N/A;  . TIBIA IM NAIL INSERTION  01/27/2012   Procedure: INTRAMEDULLARY (IM) NAIL TIBIAL;  Surgeon: Kathryne Hitch, MD;  Location: [facility];  Service: Orthopedics;  Laterality: Left;  Removal of IM Rod and Screws Left Tibia with Exchange Nail, Allograft Bone Graft Left Tibia        Home Medications    Prior to Admission medications   Medication Sig Start Date End Date Taking? Authorizing Provider  buprenorphine-naloxone (SUBOXONE) 8-2 mg SUBL SL tablet Place 1 tablet under the tongue 2 (two) times daily. 12/13/17   Gust Rung, DO  hydrochlorothiazide (HYDRODIURIL) 25 MG tablet Take 0.5 tablets (12.5 mg total) by mouth daily. 11/24/17   Henderly, Britni A, PA-C  ibuprofen (ADVIL,MOTRIN) 800 MG tablet Take 800 mg  by mouth every 8 (eight) hours as needed.    [provider]  predniSONE (DELTASONE) 20 MG tablet Take 2 tablets (40 mg total) by mouth daily with breakfast. For the next four days 01/20/18   Gerhard Munch, MD  QUEtiapine (SEROQUEL) 100 MG tablet Take 100 mg by mouth at bedtime.    [provider]  tizanidine (ZANAFLEX) 2 MG capsule Take 1 capsule (2 mg total) by mouth 3 (three) times daily. 01/20/18   Gerhard Munch, MD    Family History Family History  Problem Relation Age of Onset  . Diabetes Mother     Social History Social History   Tobacco Use  . Smoking status:  Current Every Day Smoker    Packs/day: 1.00    Years: 33.00    Pack years: 33.00    Types: E-cigarettes, Cigarettes  . Smokeless tobacco: Former Neurosurgeon    Quit date: 08/08/2006  . Tobacco comment: 1 PPD  Substance Use Topics  . Alcohol use: Yes    Comment: occasionally  . Drug use: Yes    Types: Cocaine, IV    Comment: Pt states he "shoots whatever is available".      Allergies   Patient has no known allergies.   Review of Systems Review of Systems  Constitutional: Negative for chills and fever.  Respiratory: Negative.  Negative for shortness of breath.   Cardiovascular: Negative.  Negative for chest pain.  Gastrointestinal: Negative.  Negative for abdominal pain and nausea.  Genitourinary: Negative.   Musculoskeletal: Positive for back pain.       See HPI.  Skin: Negative.  Negative for color change and wound.  Neurological: Negative.  Negative for numbness.     Physical Exam Updated Vital Signs BP (!) 135/98 (BP Location: Right Arm)   Pulse 82   Temp (!) 97.2 F (36.2 C) (Oral)   Ht 5\' 3"  (1.6 m)   Wt 72.5 kg   SpO2 98%   BMI 28.31 kg/m   Physical Exam Vitals signs and nursing note reviewed.  Constitutional:      Appearance: He is well-developed.  HENT:     Head: Normocephalic.  Neck:     Musculoskeletal: Normal range of motion and neck supple.  Cardiovascular:     Rate and Rhythm: Normal rate and regular rhythm.     Heart sounds: No murmur.  Pulmonary:     Effort: Pulmonary effort is normal.     Breath sounds: Normal breath sounds. No wheezing, rhonchi or rales.  Abdominal:     General: Bowel sounds are normal.     Palpations: Abdomen is soft.     Tenderness: There is no abdominal tenderness. There is no guarding or rebound.  Musculoskeletal: Normal range of motion.     Right lower leg: No edema.     Left lower leg: No edema.  Skin:    General: Skin is warm and dry.     Findings: No rash.  Neurological:     Mental Status: He is alert and  oriented to person, place, and time.     Deep Tendon Reflexes: Reflexes normal (Reflexes are present and equal in bilateral lower extremities.).      ED Treatments / Results  Labs (all labs ordered are listed, but only abnormal results are displayed) Labs Reviewed  CULTURE, BLOOD (ROUTINE X 2)  CULTURE, BLOOD (ROUTINE X 2)  CBC WITH DIFFERENTIAL/PLATELET  COMPREHENSIVE METABOLIC PANEL  RAPID URINE DRUG SCREEN, HOSP PERFORMED  URINALYSIS, ROUTINE W REFLEX  MICROSCOPIC  C-REACTIVE PROTEIN  SEDIMENTATION RATE    EKG None  Radiology No results found.  Procedures Procedures (including critical care time)  Medications Ordered in ED Medications  methocarbamol (ROBAXIN) injection 1,000 mg (has no administration in time range)     Initial Impression / Assessment and Plan / ED Course  I have reviewed the triage vital signs and the nursing notes.  Pertinent labs & imaging results that were available during my care of the patient were reviewed by me and considered in my medical decision making (see chart for details).     Patient to ED with progressive, severe low back pain that is similar to previous osteomyelitis in lumbar spine. No fever  Chart reviewed. The patient was admitted May of 2019 with severe osteomyelitis/discitis of L2-3 for an extended period for IV abx.   No neurologic deficits on exam today. He appears significantly uncomfortable. He is on Suboxone. Will address pain with Robaxin and reassess. Labs, including cultures are pending. Plan for MRI in the morning.   Patient care signed out to oncoming provider pending MRI results.  Final Clinical Impressions(s) / ED Diagnoses   Final diagnoses:  None   1. Low back pain  ED Discharge Orders    None       Elpidio AnisUpstill, Unique Sillas, Cordelia Poche-C 01/30/18 0553    Dione BoozeGlick, David, MD 01/30/18 [phone]

## 2018-01-30 NOTE — ED Notes (Signed)
Patient transported to MRI 

## 2018-01-30 NOTE — Discharge Instructions (Addendum)
You can take Tylenol or Ibuprofen as directed for pain. You can alternate Tylenol and Ibuprofen every 4 hours. If you take Tylenol at 1pm, then you can take Ibuprofen at 5pm. Then you can take Tylenol again at 9pm.   Follow-up with referred neurosurgeon for further evaluation.  Return to the Emergency Department immediately for any worsening back pain, neck pain, difficulty walking, numbness/weaknss of your arms or legs, urinary or bowel accidents, fever or any other worsening or concerning symptoms.

## 2018-01-30 NOTE — ED Provider Notes (Signed)
Care assumed from Charlann Lange, PA-C at shift change with MRI of lumbar spine pending.   In brief, this patient is a 43 y.o. M with PMH/o lumbar osteomyelitis and discitis, polysubstance abuse who presents for evaluation of 2 days of worsening lower back pain. Reports feels similar to past episodes of discitis and osteomyelitis. Please see note from previous provider from full history/physical exam.    PLAN: Patient pending MRI of lumbar spine.   MDM:  MRI lumbar spine shows no acute finding.  There is mention of further regression of marrow edema at the site of known L2-L3 discitis and osteomyelitis.  No progressive endplate destruction.  Patient with no leukocytosis.  Normal ESR, CRP.  Discussed results with patient.  Vital signs are stable.  Encourage follow-up with neurosurgery. Discussed patient with Dr. Wilson Singer who is agreeable to plan. At this time, patient exhibits no emergent life-threatening condition that require further evaluation in ED or admission. Patient had ample opportunity for questions and discussion. All patient's questions were answered with full understanding. Strict return precautions discussed. Patient expresses understanding and agreement to plan.   1. Acute bilateral low back pain, unspecified whether sciatica present       Desma Mcgregor 01/30/18 1339    Virgel Manifold, MD 01/31/18 [phone]

## 2018-01-30 NOTE — ED Notes (Signed)
Pt aware of urine specimen needed.  ?

## 2018-01-31 ENCOUNTER — Ambulatory Visit (INDEPENDENT_AMBULATORY_CARE_PROVIDER_SITE_OTHER): Payer: Self-pay | Admitting: Student in an Organized Health Care Education/Training Program

## 2018-01-31 ENCOUNTER — Other Ambulatory Visit: Payer: Self-pay

## 2018-01-31 VITALS — BP 136/84 | HR 100 | Temp 98.2°F | Ht 63.0 in | Wt 156.3 lb

## 2018-01-31 DIAGNOSIS — F1120 Opioid dependence, uncomplicated: Secondary | ICD-10-CM

## 2018-01-31 DIAGNOSIS — M5441 Lumbago with sciatica, right side: Secondary | ICD-10-CM

## 2018-01-31 DIAGNOSIS — M545 Low back pain: Secondary | ICD-10-CM

## 2018-01-31 DIAGNOSIS — B182 Chronic viral hepatitis C: Secondary | ICD-10-CM

## 2018-01-31 DIAGNOSIS — Z8739 Personal history of other diseases of the musculoskeletal system and connective tissue: Secondary | ICD-10-CM

## 2018-01-31 DIAGNOSIS — G8929 Other chronic pain: Secondary | ICD-10-CM

## 2018-01-31 MED ORDER — BUPRENORPHINE HCL-NALOXONE HCL 8-2 MG SL SUBL: 1.0000 | SUBLINGUAL_TABLET | Freq: Two times a day (BID) | SUBLINGUAL | 0 refills | Status: DC

## 2018-01-31 MED ORDER — CYCLOBENZAPRINE HCL 5 MG PO TABS: 5.0000 mg | ORAL_TABLET | Freq: Three times a day (TID) | ORAL | 1 refills | Status: DC | PRN

## 2018-01-31 MED FILL — CYCLOBENZAPRINE 5 MG TABLET: 5 | 10 days supply | Qty: 30 | Fill #0

## 2018-01-31 MED FILL — BUPRENORPHIN-NALOXON 8-2 MG: 8-2 | 14 days supply | Qty: 28 | Fill #0

## 2018-01-31 NOTE — Progress Notes (Signed)
   01/31/2018  Joshua Thomas presents for follow up of opioid use disorder I have reviewed the prior induction visit, follow up visits, and telephone encounters relevant to opiate use disorder (OUD) treatment.   Current daily dose: Suboxone 16 mg daily  Date of Induction: 10/04/17 with several interruptions  Current follow up interval, in weeks: 2  The patient has been adherent with the buprenorphine for OUD contract.   Last UDS Result: Buprenorphine present along with polysubstances  HPI: 43 year old man here for follow-up of opioid use disorder.  He struggled over the last 6 months to stabilize, mostly due to poor social determinants of health and uncontrolled mental health issues.  Since I last saw him over a month ago he has made some progress.  He moved into live with his mother and is closer to other family members.  This is a source of support, and some stress.  He reports no heroin use in about 2 weeks.  Does not go to meetings.  Says he still has urges and cravings to use heroin and other drugs.  Feels like his anxiety is uncontrolled.  Having increasing back pain that is intermittent, several visits to the emergency department for this.  Exam:   Vitals:   01/31/18 1113  BP: 136/84  Pulse: 100  Temp: 98.2 F (36.8 C)  TempSrc: Oral  SpO2: 100%  Weight: 156 lb 4.8 oz (70.9 kg)  Height: 5\' 3"  (1.6 m)    General: Well-appearing man, no distress, mildly diaphoretic Heart: Regular rate and rhythm with no murmurs Lungs: Clear, no crackles Extremities: Warm and well-perfused with no edema Neuro: Alert, oriented, conversational, normal strength throughout, normal gait Psych: Anxious appearing, string of ideas that are hard to follow, not depressed appearing, engages well   Assessment/Plan:  See Problem Based Charting in the Encounters Tab    [name], MD  01/31/2018  12:35 PM

## 2018-01-31 NOTE — Assessment & Plan Note (Signed)
Continues to have chronic low back pain, likely a sequela of his past osteomyelitis/discitis.  He has had repeat imaging which has shown improvement in the inflammation through the emergency department.  I prescribed a course of Flexeril to help symptomatic relief.  He is also given have to cope with some amount of pain long-term.  I offered him duloxetine as well which might help with his anxiety/depression, but he deferred this due to cost for now.

## 2018-01-31 NOTE — Assessment & Plan Note (Signed)
Stable.  Reportedly doing well.  Last heroin use was 2 months ago.  He is very proud of his accomplishments over the last few weeks.  Plan is to continue with Suboxone 2 tablets daily.  Follow-up in 2 weeks.  Urine substance test today.  I reviewed the database which was appropriate.

## 2018-01-31 NOTE — Patient Instructions (Signed)
Keep up the good work. Take the suboxone as prescribed.   I also prescribed a short supply of Flexeril to help with your back pain.

## 2018-01-31 NOTE — Assessment & Plan Note (Signed)
Patient wants to establish with infectious disease for treatment of hepatitis C.  He saw Dr. Luciana Axe when he was hospitalized about 6 months ago.  He has struggled to complete the necessary financial aid counseling paperwork to be able to get this appointment.  Were going to arrange for him to meet with our financial aid counselor.

## 2018-02-01 ENCOUNTER — Telehealth: Payer: Self-pay | Admitting: Licensed Clinical Social Worker

## 2018-02-01 NOTE — Telephone Encounter (Signed)
Patient was contacted to follow up on his visit in the office yesterday. Patient did not answer. A voicemail was left for the patient. I will attempt to call the patient again if I do not get a return phone call.

## 2018-02-04 LAB — CULTURE, BLOOD (ROUTINE X 2)
CULTURE: NO GROWTH
Culture: NO GROWTH
Special Requests: ADEQUATE
Special Requests: ADEQUATE

## 2018-02-04 LAB — TOXASSURE SELECT,+ANTIDEPR,UR

## 2018-02-08 ENCOUNTER — Ambulatory Visit: Payer: Self-pay | Admitting: Physician Assistant

## 2018-02-14 ENCOUNTER — Ambulatory Visit (INDEPENDENT_AMBULATORY_CARE_PROVIDER_SITE_OTHER): Payer: Self-pay | Admitting: Internal Medicine

## 2018-02-14 ENCOUNTER — Encounter: Payer: Self-pay | Admitting: Licensed Clinical Social Worker

## 2018-02-14 ENCOUNTER — Ambulatory Visit (INDEPENDENT_AMBULATORY_CARE_PROVIDER_SITE_OTHER): Payer: Self-pay | Admitting: Licensed Clinical Social Worker

## 2018-02-14 ENCOUNTER — Other Ambulatory Visit: Payer: Self-pay

## 2018-02-14 VITALS — BP 128/86 | HR 98 | Temp 98.2°F | Ht 63.0 in | Wt 156.5 lb

## 2018-02-14 DIAGNOSIS — M5441 Lumbago with sciatica, right side: Secondary | ICD-10-CM

## 2018-02-14 DIAGNOSIS — F1120 Opioid dependence, uncomplicated: Secondary | ICD-10-CM

## 2018-02-14 DIAGNOSIS — M545 Low back pain: Secondary | ICD-10-CM

## 2018-02-14 DIAGNOSIS — B182 Chronic viral hepatitis C: Secondary | ICD-10-CM

## 2018-02-14 DIAGNOSIS — Z79899 Other long term (current) drug therapy: Secondary | ICD-10-CM

## 2018-02-14 DIAGNOSIS — G8929 Other chronic pain: Secondary | ICD-10-CM

## 2018-02-14 DIAGNOSIS — F319 Bipolar disorder, unspecified: Secondary | ICD-10-CM

## 2018-02-14 DIAGNOSIS — F3132 Bipolar disorder, current episode depressed, moderate: Secondary | ICD-10-CM

## 2018-02-14 MED ORDER — CYCLOBENZAPRINE HCL 10 MG PO TABS: 5.0000 mg | ORAL_TABLET | Freq: Three times a day (TID) | ORAL | 1 refills | Status: DC | PRN

## 2018-02-14 MED ORDER — BUPRENORPHINE HCL-NALOXONE HCL 8-2 MG SL SUBL: 1.0000 | SUBLINGUAL_TABLET | Freq: Two times a day (BID) | SUBLINGUAL | 0 refills | Status: DC

## 2018-02-14 MED ORDER — DULOXETINE HCL 30 MG PO CPEP: ORAL_CAPSULE | ORAL | 1 refills | Status: DC

## 2018-02-14 MED FILL — BUPRENORPHIN-NALOXON 8-2 MG: 8-2 | 14 days supply | Qty: 28 | Fill #0

## 2018-02-14 NOTE — Assessment & Plan Note (Signed)
>>  ASSESSMENT AND PLAN FOR BIPOLAR DISORDER (HCC) WRITTEN ON 02/14/2018 11:03 AM BY LOVEY SATTERFIELD, MD  Patient met with behavioral health specialist Emmalene Irving just briefly today due to concerning comments made on his PHQ-9 chart surrounding his prior regarding suicidal thoughts. He was unable to be contacted via phone in the interim. He does not have notable time today to spend with our specialist but was open to initiating the conversation. He denied suicidal plans, thoughts or intentions today. He stated that on his last visit he was merely wondering out loud if those around him may be better off absent his presence, but he did not feel this to be the case and again denied thoughts of killing himself or others.   Plan: See IBH for a full visit Start Duloxetine  30mg  daily for one week and then up-titrate to 60mg  daily

## 2018-02-14 NOTE — Assessment & Plan Note (Addendum)
Last Urine Tox negative for heroin, also negative for suboxone. Given his admission to adherence to his suboxone treatment with the negative tox, we are repeating a urine tox today.   Urine tox ordered Continue Suboxone sublingual 8mg  BID

## 2018-02-14 NOTE — Assessment & Plan Note (Signed)
Was reminded that he needs to meet with the [facility]. He stated he is in the process of working on obtaining financial assistance in order to be able to visit them. I advised him to call their office and schedule an appointment.

## 2018-02-14 NOTE — Assessment & Plan Note (Signed)
The pain continues to be an issue. He continues to use percocet obtained through illicit means to treat his pain. I have again advised him that he should discuss alternative therapies to treatment with his PCP. He stated he will pursue financial assistance and schedule with his PCP.

## 2018-02-14 NOTE — Assessment & Plan Note (Addendum)
Patient met with behavioral health specialist Dessie Coma just briefly today due to concerning comments made on his PHQ-9 chart surrounding his prior regarding suicidal thoughts. He was unable to be contacted via phone in the interim. He does not have notable time today to spend with our specialist but was open to initiating the conversation. He denied suicidal plans, thoughts or intentions today. He stated that on his last visit he was merely wondering out loud if those around him may be better off absent his presence, but he did not feel this to be the case and again denied thoughts of killing himself or others.   Plan: See IBH for a full visit Start Duloxetine 53m daily for one week and then up-titrate to 644mdaily

## 2018-02-14 NOTE — BH Specialist Note (Signed)
Integrated Behavioral Health Initial Visit  MRN: [id number] Name: Joshua Thomas  Number of Integrated Behavioral Health Clinician visits:: 1/6 Session Start time: 10:10  Session End time: 10:20 Total time: 10 minutes  Type of Service: Integrated Behavioral Health- Individual/Family Interpretor:No.    Warm Hand Off Completed. Yes, by Dr. Mikey Bussing.      SUBJECTIVE: Joshua Thomas is a 43 y.o. male  whom attended the session individually.  Patient was referred by Dr. Mikey Bussing for elevated PHQ-9. Patient reports the following symptoms/concerns: depression, substance use, financial insecurities, anxiety, chronic pain, and lack of natural supports.  Duration of problem: over one year; Severity of problem: moderate  OBJECTIVE: Mood: Anxious, Depressed, Hopeless and issues with concentration.  and Affect: Depressed and Anxious Risk of harm to self or others: Suicidal ideation. Patient reported having fleeting thoughts of not being here any longer, but has never created any plan. Patient reported he would not act on his thoughts because he "Does not want to go to hell".   LIFE CONTEXT: Family and Social: Patient lives with his mother. Patient lacks natural supports. Patient reported he has to remove himself from his current friend group due to his desire to stay sober.  School/Work: Patient reported he is unable to work due to his physical pain. Patient has not applied for disability. Patient also reported he has challenges paying attention, and struggles to complete tasks.  Self-Care: Needs to improve.  Life Changes: Patient is recently sober.   GOALS ADDRESSED: Patient will: 1. Reduce symptoms of: anxiety, depression, mood instability and substance use. 2. Increase knowledge and/or ability of: coping skills, healthy habits, self-management skills and stress reduction  3. Demonstrate ability to: Increase healthy adjustment to current life circumstances, Increase adequate support systems  for patient/family, Improve medication compliance and Decrease self-medicating behaviors  INTERVENTIONS: Interventions utilized: Supportive Counseling. Therapist supported the patient has he processed his recovery process. MI was used to measure his readiness to change his negative coping skills when triggered to use. Standardized Assessments completed: PHQ 9  ASSESSMENT: Patient currently experiencing recent recovery from substances. Patient has moderate to severe level of depression. Patient has difficulty with concentration, sleep patterns, lack of interest in activities, and appetite issues. Patient has anxiety daily, and is worried about his health. Patient reported a long history of issues with concentration. Patient reported that he his trying to remain consistent with the OUD recommendations. Patient reported fleeting thoughts of being better off gone, but reported he has no history of acting on these thoughts. Patient reported his religion keeps him from acting on any thoughts.    Patient may benefit from weekly outpatient therapy.  PLAN: 1. Follow up with behavioral health clinician on : one week.   2. Referral(s): Integrated [facility] (In Clinic)   [place], [place], [place]

## 2018-02-14 NOTE — Progress Notes (Signed)
   02/14/2018  Joshua Thomas presents for follow up of opioid use disorder I have reviewed the prior induction visit, follow up visits, and telephone encounters relevant to opiate use disorder (OUD) treatment.   Current daily dose: 16mg   Date of Induction: 10/04/2017 with occassional interruptions   Current follow up interval, in weeks: 2  The patient has been adherent with the buprenorphine for OUD contract.   Last UDS Result: was inappropriately negative for suboxone  HPI: The patient presents today for evaluation of his opioid use disorder. He continues to endorse compliance with his suboxone daily. He admits to daily use of xanax of an unspecified dose that he obtains from his mother as well as percocet that he purchases on the street from various individuals. I have advised him that street pills may not be from a reputable manufacture and as such may not contain the dose listed. He stated that due to his back pain this is often needed. I have advised him to see his PCP for the back pain to better evaluate this for potential interventions as he is unable to work as a result of the pain.   Exam:    Vitals:   02/14/18 1008  BP: 128/86  Pulse: 98  Temp: 98.2 F (36.8 C)  TempSrc: Oral  SpO2: 99%  Weight: 156 lb 8 oz (71 kg)  Height: 5\' 3"  (1.6 m)   General: A/O x4, in no acute distress, afebrile, nondiaphoretic Cardio: RRR, no mrg's Pulmonary: CTA bilaterally Neuro: Alert, conversational, strength 5/5 in the upper and lower extremities bilaterally Psych: Appropriate affect, not depressed in appearance, engages well  Assessment/Plan:  See Problem Based Charting in the Encounters Tab   Lanelle Bal, MD  02/14/2018  10:39 AM

## 2018-02-15 ENCOUNTER — Encounter (HOSPITAL_COMMUNITY): Payer: Self-pay | Admitting: *Deleted

## 2018-02-15 ENCOUNTER — Other Ambulatory Visit: Payer: Self-pay

## 2018-02-15 ENCOUNTER — Emergency Department (HOSPITAL_COMMUNITY)
Admission: EM | Admit: 2018-02-15 | Discharge: 2018-02-18 | Disposition: A | Discharge status: Home / Self Care | Payer: Self-pay | Attending: Emergency Medicine | Admitting: Emergency Medicine

## 2018-02-15 DIAGNOSIS — I10 Essential (primary) hypertension: Secondary | ICD-10-CM | POA: Insufficient documentation

## 2018-02-15 DIAGNOSIS — F314 Bipolar disorder, current episode depressed, severe, without psychotic features: Secondary | ICD-10-CM | POA: Insufficient documentation

## 2018-02-15 DIAGNOSIS — F1120 Opioid dependence, uncomplicated: Secondary | ICD-10-CM | POA: Insufficient documentation

## 2018-02-15 DIAGNOSIS — F1910 Other psychoactive substance abuse, uncomplicated: Secondary | ICD-10-CM | POA: Insufficient documentation

## 2018-02-15 DIAGNOSIS — F17210 Nicotine dependence, cigarettes, uncomplicated: Secondary | ICD-10-CM | POA: Insufficient documentation

## 2018-02-15 DIAGNOSIS — J449 Chronic obstructive pulmonary disease, unspecified: Secondary | ICD-10-CM | POA: Insufficient documentation

## 2018-02-15 DIAGNOSIS — F1420 Cocaine dependence, uncomplicated: Secondary | ICD-10-CM | POA: Insufficient documentation

## 2018-02-15 DIAGNOSIS — Z79899 Other long term (current) drug therapy: Secondary | ICD-10-CM | POA: Insufficient documentation

## 2018-02-15 LAB — COMPREHENSIVE METABOLIC PANEL
ALT: 63 U/L — ABNORMAL HIGH (ref 0–44)
AST: 47 U/L — ABNORMAL HIGH (ref 15–41)
Albumin: 4.2 g/dL (ref 3.5–5.0)
Alkaline Phosphatase: 45 U/L (ref 38–126)
Anion gap: 11 (ref 5–15)
BUN: 18 mg/dL (ref 6–20)
CALCIUM: 9.3 mg/dL (ref 8.9–10.3)
CO2: 22 mmol/L (ref 22–32)
Chloride: 105 mmol/L (ref 98–111)
Creatinine, Ser: 0.88 mg/dL (ref 0.61–1.24)
GFR calc Af Amer: >60 mL/min (ref >60)
GFR calc non Af Amer: >60 mL/min (ref >60)
Glucose, Bld: 95 mg/dL (ref 70–99)
Potassium: 3.8 mmol/L (ref 3.5–5.1)
Sodium: 138 mmol/L (ref 135–145)
Total Bilirubin: 0.6 mg/dL (ref 0.3–1.2)
Total Protein: 7.9 g/dL (ref 6.5–8.1)

## 2018-02-15 LAB — CBC WITH DIFFERENTIAL/PLATELET
Abs Immature Granulocytes: 0.01 10*3/uL (ref 0.00–0.07)
Basophils Absolute: 0 10*3/uL (ref 0.0–0.1)
Basophils Relative: 0 %
Eosinophils Absolute: 0.1 10*3/uL (ref 0.0–0.5)
Eosinophils Relative: 1 %
HCT: 43.3 % (ref 39.0–52.0)
HEMOGLOBIN: 14.3 g/dL (ref 13.0–17.0)
Immature Granulocytes: 0 %
Lymphocytes Relative: 34 %
Lymphs Abs: 2.4 10*3/uL (ref 0.7–4.0)
MCH: 30.7 pg (ref 26.0–34.0)
MCHC: 33 g/dL (ref 30.0–36.0)
MCV: 92.9 fL (ref 80.0–100.0)
MONO ABS: 0.8 10*3/uL (ref 0.1–1.0)
MONOS PCT: 11 %
Neutro Abs: 3.7 10*3/uL (ref 1.7–7.7)
Neutrophils Relative %: 54 %
Platelets: 140 10*3/uL — ABNORMAL LOW (ref 150–400)
RBC: 4.66 MIL/uL (ref 4.22–5.81)
RDW: 13.1 % (ref 11.5–15.5)
WBC: 7 10*3/uL (ref 4.0–10.5)
nRBC: 0 % (ref 0.0–0.2)

## 2018-02-15 LAB — ACETAMINOPHEN LEVEL: Acetaminophen (Tylenol), Serum: <10 ug/mL — ABNORMAL LOW (ref 10–30)

## 2018-02-15 LAB — URINALYSIS, ROUTINE W REFLEX MICROSCOPIC
Bilirubin Urine: NEGATIVE
Glucose, UA: NEGATIVE mg/dL
Ketones, ur: NEGATIVE mg/dL
Leukocytes, UA: NEGATIVE
Nitrite: NEGATIVE
Protein, ur: 30 mg/dL — AB
Specific Gravity, Urine: 1.015 (ref 1.005–1.030)
pH: 5 (ref 5.0–8.0)

## 2018-02-15 LAB — RAPID URINE DRUG SCREEN, HOSP PERFORMED
Amphetamines: NOT DETECTED
Barbiturates: NOT DETECTED
Benzodiazepines: POSITIVE — AB
Cocaine: POSITIVE — AB
Opiates: NOT DETECTED
Tetrahydrocannabinol: NOT DETECTED

## 2018-02-15 LAB — SEDIMENTATION RATE: SED RATE: 20 mm/h — AB (ref 0–16)

## 2018-02-15 LAB — C-REACTIVE PROTEIN: CRP: <0.8 mg/dL (ref <1.0)

## 2018-02-15 LAB — SALICYLATE LEVEL: Salicylate Lvl: <7 mg/dL (ref 2.8–30.0)

## 2018-02-15 LAB — ETHANOL: Alcohol, Ethyl (B): <10 mg/dL (ref <10)

## 2018-02-15 MED ORDER — BUPRENORPHINE HCL-NALOXONE HCL 8-2 MG SL SUBL
1.0000 | SUBLINGUAL_TABLET | Freq: Two times a day (BID) | SUBLINGUAL | Status: DC
  Administered 2018-02-15 – 2018-02-18 (×7): 1 via SUBLINGUAL
  Filled 2018-02-15 (×7): qty 1

## 2018-02-15 MED ORDER — DULOXETINE HCL 30 MG PO CPEP
30.0000 mg | ORAL_CAPSULE | Freq: Every day | ORAL | Status: DC
  Administered 2018-02-16 – 2018-02-18 (×3): 30 mg via ORAL
  Filled 2018-02-15 (×4): qty 1

## 2018-02-15 NOTE — ED Notes (Signed)
Belongings placed on top shelf in locker room. One bag

## 2018-02-15 NOTE — ED Provider Notes (Signed)
The patient is medically cleared for psychiatric placement as of 7:00 PM on February 15, 2018  Dr. Cordie Grice, Arlys John, MD 02/15/18 1910

## 2018-02-15 NOTE — ED Provider Notes (Signed)
[facility] Provider Note   CSN: [id number] Arrival date & time: 02/15/18  0416     History   Chief Complaint Chief Complaint  Patient presents with  . V70.1    HPI Joshua Thomas is a 43 y.o. male.  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  States he does not have a plan but "thinks something might happen if I keep on going on like this".  States he has a plan to take an overdose of cocaine or heroin.  He acknowledges multiple stressors in his life as well as an argument with 1 of his friends that he will not elaborate on but is having some homicidal thoughts toward this person.  He states he does not use any needle drugs since being diagnosed with osteomyelitis last May.  He continues to snort and smoke cocaine, last use about 8 hours ago.  He also continues to abuse pills and heroin last use several weeks ago.  Continues to take Suboxone.  Denies any alcohol use.  Denies any new weakness, numbness, tingling.  No fever.  No chest pain or shortness of breath.  The history is provided by the patient.    Past Medical History:  Diagnosis Date  . ADHD (attention deficit hyperactivity disorder)   . Bipolar 1 disorder (HCC)   . Chronic back pain   . Chronic pain    chronic l leg pain  . 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 COPD (chronic obstructive pulmonary disease) (HCC)   . DDD (degenerative disc disease), lumbar   . Degenerative disc disease, lumbar   . Depression   . Discitis   . GERD (gastroesophageal reflux disease)   . Hepatitis C   . Hepatitis C   . History of stomach ulcers   . Hx of diabetes mellitus    due to infection  . Hypertension   . Lung nodules    3 on L and 2 on R  . PTSD (post-traumatic stress disorder)   . Sciatica   . Substance abuse [facility](HCC)      Patient Active Problem List   Diagnosis Date Noted  . Chronic low back pain 09/23/2017  . Essential hypertension 09/23/2017  . Hyperglycemia 06/03/2017  . GERD (gastroesophageal reflux disease) 05/17/2017  . Bipolar disorder (HCC) 05/17/2017  . Opioid use disorder, moderate, dependence (HCC) 05/23/2015  . Chronic hepatitis C virus genotype 1a infection (HCC)     Past Surgical History:  Procedure Laterality Date  . FRACTURE SURGERY    . HARDWARE REMOVAL  01/27/2012   Procedure: HARDWARE REMOVAL;  Surgeon: Kathryne Hitchhristopher Y Blackman, MD;  Location: [facility] ORS;  Service: Orthopedics;  Laterality: Left;  . IR LUMBAR DISC ASPIRATION W/IMG GUIDE  06/08/2017  . RADIOLOGY WITH ANESTHESIA N/A 06/08/2017   Procedure: DISC ASPIRATION;  Surgeon: Julieanne Cottoneveshwar, Sanjeev, MD;  Location: [facility] OR;  Service: Radiology;  Laterality: N/A;  . TIBIA IM NAIL INSERTION  01/27/2012   Procedure: INTRAMEDULLARY (IM) NAIL TIBIAL;  Surgeon: Kathryne Hitchhristopher Y Blackman, MD;  Location: [facility] ORS;  Service: Orthopedics;  Laterality: Left;  Removal of IM Rod and Screws Left Tibia with Exchange Nail, Allograft Bone Graft Left Tibia        Home Medications    Prior to Admission medications   Medication Sig Start Date End Date Taking? Authorizing Provider  buprenorphine-naloxone (SUBOXONE) 8-2  mg SUBL SL tablet Place 1 tablet under the tongue 2 (two) times daily. 02/14/18   Gust RungHoffman, Erik C, DO  cyclobenzaprine (FLEXERIL) 10 MG tablet Take 0.5 tablets (5 mg total) by mouth 3 (three) times daily as needed for muscle spasms. 02/14/18   Gust RungHoffman, Erik C, DO  DULoxetine (CYMBALTA) 30 MG capsule Please take 30mg  (1 tablet) daily for one week and then increase to 60mg  (2 tablets) daily. 02/14/18   Lanelle BalHarbrecht, Lawrence, MD    Family History Family History  Problem Relation Age of Onset  . Diabetes Mother     Social History Social History   Tobacco Use  . Smoking status: Current Every Day Smoker    Packs/day: 1.00    Years: 33.00    Pack  years: 33.00    Types: E-cigarettes, Cigarettes  . Smokeless tobacco: Former NeurosurgeonUser    Quit date: 08/08/2006  . Tobacco comment: 1 PPD  Substance Use Topics  . Alcohol use: Yes    Comment: occasionally  . Drug use: Yes    Types: Cocaine, IV    Comment: Pt states he "shoots whatever is available".      Allergies   Patient has no known allergies.   Review of Systems Review of Systems  Constitutional: Negative for appetite change and fever.  HENT: Negative for congestion and rhinorrhea.   Eyes: Negative for visual disturbance.  Respiratory: Negative for chest tightness.   Cardiovascular: Negative for chest pain.  Gastrointestinal: Negative for abdominal pain, nausea and vomiting.  Genitourinary: Negative for dysuria and hematuria.  Musculoskeletal: Positive for arthralgias, back pain and myalgias.  Skin: Negative for wound.  Neurological: Negative for dizziness, weakness, light-headedness and headaches.   all other systems are negative except as noted in the HPI and PMH.     Physical Exam Updated Vital Signs BP (!) 140/105 (BP Location: Left Arm)   Pulse 100   Temp 98.1 F (36.7 C) (Oral)   Resp 17   Ht 5\' 3"  (1.6 m)   Wt 70.9 kg   SpO2 97%   BMI 27.69 kg/m   Physical Exam Vitals signs and nursing note reviewed.  Constitutional:      General: He is not in acute distress.    Appearance: He is well-developed.  HENT:     Head: Normocephalic and atraumatic.     Mouth/Throat:     Pharynx: No oropharyngeal exudate.  Eyes:     Conjunctiva/sclera: Conjunctivae normal.     Pupils: Pupils are equal, round, and reactive to light.  Neck:     Musculoskeletal: Normal range of motion and neck supple.     Comments: No meningismus. Cardiovascular:     Rate and Rhythm: Normal rate and regular rhythm.     Heart sounds: Normal heart sounds. No murmur.  Pulmonary:     Effort: Pulmonary effort is normal. No respiratory distress.     Breath sounds: Normal breath sounds.   Abdominal:     Palpations: Abdomen is soft.     Tenderness: There is no abdominal tenderness. There is no guarding or rebound.  Musculoskeletal: Normal range of motion.        General: No tenderness.     Comments: Paraspinal lumbar tenderness. No stepoffs or midline tenderness.  5/5 strength in bilateral lower extremities. Ankle plantar and dorsiflexion intact. Great toe extension intact bilaterally. +2 DP and PT pulses. +2 patellar reflexes bilaterally. Normal gait.   Skin:    General: Skin is warm.     Capillary  Refill: Capillary refill takes less than 2 seconds.  Neurological:     General: No focal deficit present.     Mental Status: He is alert and oriented to person, place, and time. Mental status is at baseline.     Cranial Nerves: No cranial nerve deficit.     Motor: No abnormal muscle tone.     Coordination: Coordination normal.     Comments: No ataxia on finger to nose bilaterally. No pronator drift. 5/5 strength throughout. CN 2-12 intact.Equal grip strength. Sensation intact.   Psychiatric:        Behavior: Behavior normal.      ED Treatments / Results  Labs (all labs ordered are listed, but only abnormal results are displayed) Labs Reviewed  CBC WITH DIFFERENTIAL/PLATELET - Abnormal; Notable for the following components:      Result Value   Platelets 140 (*)    All other components within normal limits  COMPREHENSIVE METABOLIC PANEL - Abnormal; Notable for the following components:   AST 47 (*)    ALT 63 (*)    All other components within normal limits  ACETAMINOPHEN LEVEL - Abnormal; Notable for the following components:   Acetaminophen (Tylenol), Serum <10 (*)    All other components within normal limits  ETHANOL  SALICYLATE LEVEL  RAPID URINE DRUG SCREEN, HOSP PERFORMED  SEDIMENTATION RATE  C-REACTIVE PROTEIN  URINALYSIS, ROUTINE W REFLEX MICROSCOPIC    EKG None  Radiology No results found.  Procedures Procedures (including critical care  time)  Medications Ordered in ED Medications - No data to display   Initial Impression / Assessment and Plan / ED Course  I have reviewed the triage vital signs and the nursing notes.  Pertinent labs & imaging results that were available during my care of the patient were reviewed by me and considered in my medical decision making (see chart for details).    Patient with polysubstance abuse presenting with vague suicidal and homicidal thoughts.  He is calm and cooperative.  Screening labs obtained.  Patient is calm and cooperative.  He is medically clear for TTS evaluation.  He does meet inpatient criteria.  Holding orders are placed.  Final Clinical Impressions(s) / ED Diagnoses   Final diagnoses:  Suicidal ideation    ED Discharge Orders    None       Isabele Lollar, Jeannett Senior, MD 02/15/18 [id number]

## 2018-02-15 NOTE — Progress Notes (Signed)
Pt meets inpatient criteria per Assunta Found, NP. Referral information has been sent to the following hospitals for review: [facility]  [facility]  [facility]  [facility]  [facility]  [facility]  [facility]   Disposition will continue to assist with inpatient placement needs.   Wells Guiles, LCSW, LCAS Disposition CSW [facility] [phone]

## 2018-02-15 NOTE — ED Notes (Addendum)
Patient states he wants to take his home medication of flexeril to help his back. I explained to patient that he is prescribed cymbalta for depression and that it can help with nerve pain. He refuses to believe this information and only wants flexeril. Dr. Clarene Duke informed.

## 2018-02-15 NOTE — ED Notes (Signed)
Security in room to collect belongings.

## 2018-02-15 NOTE — Progress Notes (Signed)
Internal Medicine Clinic Attending  I saw and evaluated the patient.  I personally confirmed the key portions of the history and exam documented by Dr. Harbrecht and I reviewed pertinent patient test results.  The assessment, diagnosis, and plan were formulated together and I agree with the documentation in the resident's note.  

## 2018-02-15 NOTE — ED Notes (Signed)
Per TTS inpatient placement is recommended [facility] bed may be available later today. EDP notified of the same.
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
## 2018-02-15 NOTE — ED Notes (Signed)
Pt given sprite with ice and graham crackers.

## 2018-02-15 NOTE — ED Triage Notes (Signed)
Pt states he does want to hurt himself but no specific plan; pt states he has some things going on in his life that is stressful

## 2018-02-15 NOTE — ED Notes (Signed)
Pt aggitated and pacing in room. Upset that staff needed to take phone, charger, and ear buds to be put in locker
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
## 2018-02-16 MED ORDER — NICOTINE 21 MG/24HR TD PT24
21.0000 mg | MEDICATED_PATCH | Freq: Once | TRANSDERMAL | Status: AC
  Administered 2018-02-16: 21 mg via TRANSDERMAL
  Filled 2018-02-16: qty 1

## 2018-02-16 NOTE — [facility] Counselor (Signed)
Reassessment-Pt continues to report SI with a plan. Pt denies HI and AVH.  Pt states he needs inpatient treatment because he does not want to go on the streets and use drugs.  TTS will continue to seek placement.  Wolfgang Phoenix, [facility] Triage Specialist

## 2018-02-16 NOTE — ED Notes (Signed)
Pt upset due to not being able to have soft drinks. I told him he could get them with meals.

## 2018-02-16 NOTE — ED Notes (Signed)
Tele psych in progress at this time. 

## 2018-02-16 NOTE — ED Notes (Signed)
Pt given breakfast tray

## 2018-02-17 MED ORDER — NICOTINE 14 MG/24HR TD PT24: 14.0000 mg | MEDICATED_PATCH | Freq: Once | TRANSDERMAL | Status: DC

## 2018-02-17 MED ORDER — NICOTINE 21 MG/24HR TD PT24
21.0000 mg | MEDICATED_PATCH | Freq: Every day | TRANSDERMAL | Status: DC
  Administered 2018-02-17: 21 mg via TRANSDERMAL
  Filled 2018-02-17 (×2): qty 1
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
## 2018-02-17 NOTE — ED Notes (Addendum)
RN spoke with pt to get updated phone number for his mother for [facility]. Pt stated, "I don't want anybody talking to my mother. I'm a grown man. I'll let my mother know when I want to which is when I get out of here. Ain't nobody gonna tell a grown man that they have to talk to his mother just so they can leave." Pt laughing while saying this and then states, "I can wait for days here with y'all. I ain't playing this game. Y'all can wait on me. I'll just chill right here for the next week if I need to." Pt reports he wants to be the one to tell his mother that he was here at the hospital trying to get help after he gets discharged. Pt reports his mother is trying to get him into a rehab center in [place] through his church that takes about 5-7 days to get into. RN asks pt if he is wanting to leave and pt states, "I don't care if I leave or not. I can either wait at home until I can get into that facility or I can wait here. Doesn't bother me. I can wait it out and play y'alls game." RN attempted to call Reola Calkins, FNP to notify him but was unable to speak with him at this time. [facility] reports they will have him return call to RN.

## 2018-02-17 NOTE — ED Notes (Signed)
Meal tray given 

## 2018-02-17 NOTE — ED Notes (Signed)
Pt given lunch tray.

## 2018-02-18 ENCOUNTER — Encounter (HOSPITAL_COMMUNITY): Payer: Self-pay | Admitting: Registered Nurse

## 2018-02-18 LAB — TOXASSURE SELECT,+ANTIDEPR,UR

## 2018-02-18 NOTE — Discharge Instructions (Addendum)
Substance Abuse Treatment Programs ° °Intensive Outpatient Programs °[facility]     °[address]      °[place], [place]                   °[phone]      ° °[facility] °[address] °[place], [place] °[phone] ° °[facility]     °(Inpatient and outpatient)     °[address]           °[phone]   ° °[facility] °[phone] (Suboxone and Methadone) ° °[address]      °[place], [place] [address]      °[phone]      ° °[address] °[place], [place] °852-3033 ° °[facility] (Outpatient/Inpatient, Chemical)    °(insurance only) [phone]      °       °[facility] (Groups & Residential) °[place], [place] °[phone] ° °   °[facility]     °[address]     °[place], [place]      °[phone]      ° °[facility] (for caregivers and family) °[address] °[place], [place] °336-299-4655 ° ° ° ° ° °Residential Treatment Programs °[facility]      °[address], [place], [place] [address]  °([phone]      ° °[facility] °[address] [place], [place] [address] °[phone] ° °[facility]        °[phone]      ° °[facility] °1-800-659-3381 ° °[facility]             °[address]                                         °[place], [place]                                                °[phone] or 336-784-9470                              ° °[facility] [place] °[address] °[place], [address] °1.877.941.8954 ° °[facility]    °[address]      °, [place]     °[phone]      ° °[facility] °[address] °, [place] °336-285-9073 ° °[facility]   °[address]     °[place], [place] [address]     °[phone]      °Admissions: 8am-3pm M-F ° °[facility] °[address] °[place],  [place] °336-227-7417 ° °[facility]: Residential Program (90 Days)   °[place], [place]      °336-725-8389 or [phone]    ° °[facility] °[place], [place] °(Walk in Hours over the weekend or by referral) ° °[facility] °[address], [place], [place] [address] °(336) [phone] ° °Crisis Mobile: [facility]:  1-877-626-1772 (for crisis response 24 hours a day) °[facility] Hotline:      1-800-256-2452 °Outpatient Psychiatry and Counseling ° °[facility]: Mobile Crisis   Management 24 hours:  1-579-867-5796  [facility] of the [employer] sliding scale fee and walk in schedule: M-F 8am-12pm/1pm-3pm [address]  [place], [place] [address] [place], [place] [address] [phone]  [facility] ([facility])- new patient walk-in appointments available Monday - Friday 8am -3pm.          [address] [place], [place] [address] [phone] or crisis line- [facility]/ Intensive Outpatient Therapy Program [place], [place] [address] [place]      [phone] [place]. [place], [place] [address]                 [place]   [facility] [phone]. [place], [place] [address]   [employer] [facility]          [address] Johnette Abraham  [place], [place] [address]       [phone]  [facility] [address] [place], [place] [address] [phone]  [facility]    [address] [place], [place] [address]     [place], [place] Joycelyn Man     [place] [address]     [phone]       [facility] [place] [address]  [facility]     [address], [place], [place], [place] [address] [place]     [address]     [place], [place] [address]     [phone]       [facility] [address] [place], [place] [address] [phone] Resources for Temporary Residential Assistance/Crisis [facility]) M-F 8am-3pm   [address] [place], [place] [address]   [phone] Services include: laundry, barbering, support groups, case management, phone  & computer access, showers, AA/NA mtgs, mental health/substance abuse nurse, job skills class, disability information, VA assistance, spiritual classes, etc.   [facility]   [address], [place]     [place]              [employer] (women and children)       [place]. [place], [place] [address] [phone] [email or web address] for application and process Application Required  [facility]   [address]    [place] [address]     [phone]                    [place],  [address] [phone](OILNZVJK application appt.) Application Required  [facility] (women only)    [address]     [place],  [address]     [phone]  Intake starts 6pm daily Need valid ID, SSC, & Police report [employer] [address] [place], [place] [phone] Application Required  [facility] (men only)     [address]      [place], [place]     [phone]       [facility] (Pregnant women only) [address] [place], [place] [phone]  [facility]      [address]      [place], [place] [address]     [phone]             [facility] [address] [place], [place] [phone] 90 day commitment/SA/Application process  [facility](men only)     [address]     [place], [place]     [phone]       Check-in at [facility] [address] [place], [place] [address] [phone] Men/Women/Women and Children must be there by 7 pm  [facility] [place], [place] [phone]                  Take your usual prescriptions as previously directed.  Call your regular medical doctor on Monday to schedule a follow up appointment within the next week. Call the mental health and substance abuse treatment programs given to you today for follow up.  Return to the Emergency Department immediately sooner if worsening.

## 2018-02-18 NOTE — BH Assessment (Signed)
Collateral contact note: Pt agreeable for Assunta Found, NP &  this writer to speak with his mother for collateral information. Pt advised specific information re: current hospitalization did not need to be discussed with his mother, but it was necessary to gain collateral support regarding safety forhis discharge from ED. Pt's mother, Westley Hummer [phone]), reports by phone she hasn't seen her son in about 5 days. She states he doesn't "live" with her but he keeps his things there & will stay for a while at a time. Mother reports when pt is not using drugs he is a "joy" & she would have no concerns for his safety . She states pt has a strong addiction and repeatedly binge uses. Mother reports grandson keeps a rifle in her home but that it would not be accessible to pt. Mother states she wants pt to engage in substance abuse tx. Substance abuse tx information to be supplied to pt at discharge.

## 2018-02-18 NOTE — ED Provider Notes (Signed)
Psych team has re-evaluated pt today: Pt without SI/HI/AVH, does not meet inpt criteria at this time, pt's mother willing to take him home, pt to be d/c with outpt mental health and substance abuse resources. Will d/c stable.    Jael, Krause, DO 02/18/18 1012
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
## 2018-02-18 NOTE — BH Assessment (Signed)
Discharge information for pt:  Substance Abuse Treatment: [facility] [address] [place]  [address] [phone]  [facility] [address] [place], [place] [address] [phone]  Mobile Crisis: For Crisis Response, Call [phone] 24 hours a day, 7 days a week   [facility] NA meeting schedule: Meeting Title Location Address Day Time [facility] [employer] [address] [place], [place] [address] SUNDAY 8:00 PM Narcotics [employer] [address] [place], [place] [address] MONDAY 12:00 PM Narcotics [employer] [address] [place] [address] [place], [place] [address] TUESDAY 8:00 PM Narcotics Anonymous [employer] [address] [place], [place] [address] [facility] 6:00 PM Narcotics Anonymous [employer] [address] [place], [place] [address] THURSDAY 8:00 PM Narcotics Anonymous [employer] [address] [place], [place] [address] FRIDAY 8:00 PM Narcotics Anonymous [employer] [address] [place], [place] [address] SATURDAY 8:00 PM Narcotics Anonymous [facility] [address] [place], [place] [address] TUESDAY 8:00 PM Narcotics Anonymous [employer] [address] [place], [place] [address] FRIDAY 8:00 PM Narcotics Anonymous [employer] [address] [place], [place] [address] SATURDAY 8:00 PM Narcotics Anonymous [employer] [address] [place], [place] [address] [facility] 6:00 PM Narcotics Anonymous [employer] [address] [place], [place] [address] THURSDAY 8:00 PM Narcotics Anonymous [employer] [address] [place], [place] [address] MONDAY 12:00 PM Narcotics Anonymous [employer], [employer] [address] [place], [place] [address] MONDAY 12:00 PM Narcotics Anonymous [employer], [employer] [address] [place], [place] [address] TUESDAY 8:00 PM Narcotics Anonymous [employer], [employer] [address] [place], [place] [address] [facility] 6:00 PM Narcotics Anonymous [employer], [employer] [address] [place], [place] [address] THURSDAY 8:00 PM Narcotics Anonymous [employer] [address] [place], [place] [address] FRIDAY 8:00 PM Narcotics Anonymous

## 2018-02-18 NOTE — ED Notes (Signed)
New Nicotine patch applied. Old patch fell off
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
## 2018-02-20 ENCOUNTER — Encounter: Payer: Self-pay | Admitting: Physician Assistant

## 2018-02-26 ENCOUNTER — Emergency Department (HOSPITAL_COMMUNITY)
Admission: EM | Admit: 2018-02-26 | Discharge: 2018-02-26 | Discharge status: Against Medical Advice | Payer: Self-pay | Attending: Emergency Medicine | Admitting: Emergency Medicine

## 2018-02-26 ENCOUNTER — Encounter (HOSPITAL_COMMUNITY): Payer: Self-pay | Admitting: Emergency Medicine

## 2018-02-26 ENCOUNTER — Ambulatory Visit (HOSPITAL_COMMUNITY): Admission: RE | Admit: 2018-02-26 | Payer: Self-pay | Source: Ambulatory Visit

## 2018-02-26 ENCOUNTER — Emergency Department (HOSPITAL_COMMUNITY): Payer: Self-pay

## 2018-02-26 ENCOUNTER — Other Ambulatory Visit: Payer: Self-pay

## 2018-02-26 DIAGNOSIS — E119 Type 2 diabetes mellitus without complications: Secondary | ICD-10-CM | POA: Insufficient documentation

## 2018-02-26 DIAGNOSIS — F1920 Other psychoactive substance dependence, uncomplicated: Secondary | ICD-10-CM | POA: Insufficient documentation

## 2018-02-26 DIAGNOSIS — F1410 Cocaine abuse, uncomplicated: Secondary | ICD-10-CM | POA: Insufficient documentation

## 2018-02-26 DIAGNOSIS — I10 Essential (primary) hypertension: Secondary | ICD-10-CM | POA: Insufficient documentation

## 2018-02-26 DIAGNOSIS — F17210 Nicotine dependence, cigarettes, uncomplicated: Secondary | ICD-10-CM | POA: Insufficient documentation

## 2018-02-26 DIAGNOSIS — Z5320 Procedure and treatment not carried out because of patient's decision for unspecified reasons: Secondary | ICD-10-CM | POA: Insufficient documentation

## 2018-02-26 DIAGNOSIS — F17290 Nicotine dependence, other tobacco product, uncomplicated: Secondary | ICD-10-CM | POA: Insufficient documentation

## 2018-02-26 DIAGNOSIS — R110 Nausea: Secondary | ICD-10-CM | POA: Insufficient documentation

## 2018-02-26 DIAGNOSIS — R1032 Left lower quadrant pain: Secondary | ICD-10-CM | POA: Insufficient documentation

## 2018-02-26 DIAGNOSIS — N50812 Left testicular pain: Secondary | ICD-10-CM | POA: Insufficient documentation

## 2018-02-26 DIAGNOSIS — Z79899 Other long term (current) drug therapy: Secondary | ICD-10-CM | POA: Insufficient documentation

## 2018-02-26 DIAGNOSIS — R300 Dysuria: Secondary | ICD-10-CM | POA: Insufficient documentation

## 2018-02-26 LAB — RAPID URINE DRUG SCREEN, HOSP PERFORMED
Amphetamines: NOT DETECTED
Barbiturates: NOT DETECTED
Benzodiazepines: POSITIVE — AB
Cocaine: POSITIVE — AB
Opiates: POSITIVE — AB
Tetrahydrocannabinol: NOT DETECTED

## 2018-02-26 LAB — BASIC METABOLIC PANEL
Anion gap: 9 (ref 5–15)
BUN: 12 mg/dL (ref 6–20)
CO2: 22 mmol/L (ref 22–32)
Calcium: 9 mg/dL (ref 8.9–10.3)
Chloride: 111 mmol/L (ref 98–111)
Creatinine, Ser: 0.89 mg/dL (ref 0.61–1.24)
GFR calc Af Amer: >60 mL/min (ref >60)
GFR calc non Af Amer: >60 mL/min (ref >60)
Glucose, Bld: 102 mg/dL — ABNORMAL HIGH (ref 70–99)
Potassium: 3.2 mmol/L — ABNORMAL LOW (ref 3.5–5.1)
Sodium: 142 mmol/L (ref 135–145)

## 2018-02-26 LAB — CBC WITH DIFFERENTIAL/PLATELET
Abs Immature Granulocytes: 0.02 10*3/uL (ref 0.00–0.07)
Basophils Absolute: 0 10*3/uL (ref 0.0–0.1)
Basophils Relative: 0 %
Eosinophils Absolute: 0.1 10*3/uL (ref 0.0–0.5)
Eosinophils Relative: 1 %
HCT: 43.8 % (ref 39.0–52.0)
Hemoglobin: 14.2 g/dL (ref 13.0–17.0)
IMMATURE GRANULOCYTES: 0 %
Lymphocytes Relative: 44 %
Lymphs Abs: 2.9 10*3/uL (ref 0.7–4.0)
MCH: 30.9 pg (ref 26.0–34.0)
MCHC: 32.4 g/dL (ref 30.0–36.0)
MCV: 95.4 fL (ref 80.0–100.0)
MONOS PCT: 8 %
Monocytes Absolute: 0.5 10*3/uL (ref 0.1–1.0)
Neutro Abs: 3 10*3/uL (ref 1.7–7.7)
Neutrophils Relative %: 47 %
Platelets: 144 10*3/uL — ABNORMAL LOW (ref 150–400)
RBC: 4.59 MIL/uL (ref 4.22–5.81)
RDW: 13.4 % (ref 11.5–15.5)
WBC: 6.5 10*3/uL (ref 4.0–10.5)
nRBC: 0 % (ref 0.0–0.2)

## 2018-02-26 MED ORDER — MORPHINE SULFATE (PF) 4 MG/ML IV SOLN
4.0000 mg | Freq: Once | INTRAVENOUS | Status: AC
  Administered 2018-02-26: 4 mg via INTRAVENOUS
  Filled 2018-02-26: qty 1

## 2018-02-26 MED ORDER — KETOROLAC TROMETHAMINE 30 MG/ML IJ SOLN
30.0000 mg | Freq: Once | INTRAMUSCULAR | Status: AC
  Administered 2018-02-26: 30 mg via INTRAVENOUS
  Filled 2018-02-26: qty 1

## 2018-02-26 MED ORDER — ONDANSETRON HCL 4 MG/2ML IJ SOLN
4.0000 mg | Freq: Once | INTRAMUSCULAR | Status: AC
  Administered 2018-02-26: 4 mg via INTRAVENOUS
  Filled 2018-02-26: qty 2

## 2018-02-26 MED ORDER — HYDROMORPHONE HCL 1 MG/ML IJ SOLN
1.0000 mg | Freq: Once | INTRAMUSCULAR | Status: AC
  Administered 2018-02-26: 1 mg via INTRAVENOUS
  Filled 2018-02-26: qty 1

## 2018-02-26 NOTE — ED Notes (Signed)
Pt has decided to leave at this time.  Gait steady in no distress.

## 2018-02-26 NOTE — ED Notes (Signed)
Pt being very difficult at this time.  Is running in and out of room with various demands.  Security and officer called to bedside.  Lab called and states his urine was not enough to run UA.  MD aware.

## 2018-02-26 NOTE — ED Triage Notes (Signed)
Pt c/o left abd pain that he feels could be kidney stones x 1 day

## 2018-02-26 NOTE — ED Provider Notes (Signed)
Blood pressure (!) 133/94, pulse 97, temperature 97.8 F (36.6 C), temperature source Oral, resp. rate 16, height 5\' 3"  (1.6 m), weight 72.6 kg, SpO2 100 %.  Assuming care from Dr. Manus Gunning.  In short, Joshua Thomas is a 43 y.o. male with a chief complaint of Abdominal Pain .  Refer to the original H&P for additional details.  The current plan of care is to f/u testicular US.  08:10 AM Patient abruptly left the ED prior to reassessment. Nursing observed steady gait. No concerns for safety. U tox resulted with opiate, cocaine, and bezo positive. UA cancelled by lab. [place] not completed.     Maia Plan, MD 02/26/18 [phone]

## 2018-02-26 NOTE — ED Provider Notes (Signed)
[facility] EMERGENCY DEPARTMENT Provider Note   CSN: [id number] Arrival date & time: 02/26/18  0422     History   Chief Complaint Chief Complaint  Patient presents with  . Abdominal Pain    HPI Joshua Thomas is a 43 y.o. male.  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  Reports the pain was initially intermittent but is now becoming more constant.  He has been taking ibuprofen without relief.  He feels this pain may be due to kidney stones.  The pain radiates to the left testicle and his left groin.  He states he does not have any pain with urination.  Did have some hematuria yesterday but none today.  No fevers, nausea or vomiting.  No chest pain or shortness of breath.  He states this pain is very different from his previous osteomyelitis and discitis of his lumbar spine.  He states his last IV drug use was several months ago and he has not used any IV drugs since he was diagnosed with discitis last May.  States he is still on Suboxone and is occasionally buying Percocets on the street.  The history is provided by the patient and the EMS personnel.  Abdominal Pain  Associated symptoms: dysuria and nausea   Associated symptoms: no chest pain, no cough, no fever, no hematuria, no shortness of breath and no vomiting     Past Medical History:  Diagnosis Date  . ADHD (attention deficit hyperactivity disorder)   . Bipolar 1 disorder (HCC)   . Chronic back pain   . Chronic pain    chronic l leg pain  . 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 COPD (chronic obstructive pulmonary disease) (HCC)   . DDD (degenerative disc disease), lumbar   . Degenerative disc disease, lumbar   . Depression   . Discitis   . GERD (gastroesophageal reflux disease)    . Hepatitis C   . Hepatitis C   . History of stomach ulcers   . Hx of diabetes mellitus    due to infection  . Hypertension   . Lung nodules    3 on L and 2 on R  . PTSD (post-traumatic stress disorder)   . Sciatica   . Substance abuse [facility])     Patient Active Problem List   Diagnosis Date Noted  . Chronic low back pain 09/23/2017  . Essential hypertension 09/23/2017  . Hyperglycemia 06/03/2017  . GERD (gastroesophageal reflux disease) 05/17/2017  . Bipolar disorder (HCC) 05/17/2017  . Opioid use disorder, moderate, dependence (HCC) 05/23/2015  . Chronic hepatitis C virus genotype 1a infection (HCC)     Past Surgical History:  Procedure Laterality Date  . FRACTURE SURGERY    . HARDWARE REMOVAL  01/27/2012   Procedure: HARDWARE REMOVAL;  Surgeon: Kathryne Hitch, MD;  Location: [facility] ORS;  Service: Orthopedics;  Laterality: Left;  . IR LUMBAR DISC ASPIRATION W/IMG GUIDE  06/08/2017  . RADIOLOGY WITH ANESTHESIA N/A 06/08/2017   Procedure: DISC ASPIRATION;  Surgeon: Julieanne Cotton, MD;  Location: [facility] OR;  Service: Radiology;  Laterality: N/A;  . TIBIA IM NAIL INSERTION  01/27/2012   Procedure: INTRAMEDULLARY (IM) NAIL TIBIAL;  Surgeon: Kathryne Hitch, MD;  Location: [facility] ORS;  Service: Orthopedics;  Laterality: Left;  Removal of IM  Rod and Screws Left Tibia with Exchange Nail, Allograft Bone Graft Left Tibia        Home Medications    Prior to Admission medications   Medication Sig Start Date End Date Taking? Authorizing Provider  buprenorphine-naloxone (SUBOXONE) 8-2 mg SUBL SL tablet Place 1 tablet under the tongue 2 (two) times daily. 02/14/18   Gust Rung, DO  cyclobenzaprine (FLEXERIL) 10 MG tablet Take 0.5 tablets (5 mg total) by mouth 3 (three) times daily as needed for muscle spasms. 02/14/18   Gust Rung, DO  DULoxetine (CYMBALTA) 30 MG capsule Please take 30mg  (1 tablet) daily for one week and then increase to 60mg  (2 tablets) daily. 02/14/18    Lanelle Bal, MD    Family History Family History  Problem Relation Age of Onset  . Diabetes Mother     Social History Social History   Tobacco Use  . Smoking status: Current Every Day Smoker    Packs/day: 1.00    Years: 33.00    Pack years: 33.00    Types: E-cigarettes, Cigarettes  . Smokeless tobacco: Former Neurosurgeon    Quit date: 08/08/2006  . Tobacco comment: 1 PPD  Substance Use Topics  . Alcohol use: Yes    Comment: occasionally  . Drug use: Yes    Types: Cocaine, IV    Comment: Pt states he "shoots whatever is available".  last used 1 week ago      Allergies   Patient has no known allergies.   Review of Systems Review of Systems  Constitutional: Negative for activity change, appetite change and fever.  HENT: Negative for congestion.   Eyes: Negative for visual disturbance.  Respiratory: Negative for cough, chest tightness and shortness of breath.   Cardiovascular: Negative for chest pain.  Gastrointestinal: Positive for abdominal pain and nausea. Negative for vomiting.  Genitourinary: Positive for dysuria, flank pain, testicular pain and urgency. Negative for hematuria.  Musculoskeletal: Positive for back pain. Negative for arthralgias and myalgias.  Neurological: Negative for dizziness, weakness and headaches.   all other systems are negative except as noted in the HPI and PMH.     Physical Exam Updated Vital Signs BP (!) 133/94 (BP Location: Right Arm)   Pulse 97   Temp 97.8 F (36.6 C) (Oral)   Resp 16   Ht 5\' 3"  (1.6 m)   Wt 72.6 kg   SpO2 100%   BMI 28.34 kg/m   Physical Exam Vitals signs and nursing note reviewed.  Constitutional:      General: He is not in acute distress.    Appearance: He is well-developed.     Comments: Appears uncomfortable  HENT:     Head: Normocephalic and atraumatic.     Mouth/Throat:     Pharynx: No oropharyngeal exudate.  Eyes:     Conjunctiva/sclera: Conjunctivae normal.     Pupils: Pupils are equal,  round, and reactive to light.  Neck:     Musculoskeletal: Normal range of motion and neck supple.     Comments: No meningismus. Cardiovascular:     Rate and Rhythm: Normal rate and regular rhythm.     Heart sounds: Normal heart sounds. No murmur.  Pulmonary:     Effort: Pulmonary effort is normal. No respiratory distress.     Breath sounds: Normal breath sounds.  Abdominal:     Palpations: Abdomen is soft.     Tenderness: There is abdominal tenderness. There is no guarding or rebound.     Comments: Left  lower quadrant tenderness to palpation No appreciable hernia. Patient describes pain extending into the left inguinal canal  Genitourinary:    Comments: Testicles appear normal to inspection.  They have normal lie.  They are not tender to palpation. Musculoskeletal: Normal range of motion.        General: Tenderness present.     Comments: Left paraspinal lumbar tenderness, no midline tenderness  5/5 strength in bilateral lower extremities. Ankle plantar and dorsiflexion intact. Great toe extension intact bilaterally. +2 DP and PT pulses. +2 patellar reflexes bilaterally. Normal gait.   Skin:    General: Skin is warm.     Capillary Refill: Capillary refill takes less than 2 seconds.  Neurological:     General: No focal deficit present.     Mental Status: He is alert and oriented to person, place, and time. Mental status is at baseline.     Cranial Nerves: No cranial nerve deficit.     Motor: No abnormal muscle tone.     Coordination: Coordination normal.     Comments: No ataxia on finger to nose bilaterally. No pronator drift. 5/5 strength throughout. CN 2-12 intact.Equal grip strength. Sensation intact.   Psychiatric:        Behavior: Behavior normal.      ED Treatments / Results  Labs (all labs ordered are listed, but only abnormal results are displayed) Labs Reviewed  CBC WITH DIFFERENTIAL/PLATELET - Abnormal; Notable for the following components:      Result Value    Platelets 144 (*)    All other components within normal limits  BASIC METABOLIC PANEL - Abnormal; Notable for the following components:   Potassium 3.2 (*)    Glucose, Bld 102 (*)    All other components within normal limits  URINALYSIS, ROUTINE W REFLEX MICROSCOPIC  RAPID URINE DRUG SCREEN, HOSP PERFORMED    EKG None  Radiology Ct Renal Stone Study  Result Date: 02/26/2018 CLINICAL DATA:  Left flank pain EXAM: CT ABDOMEN AND PELVIS WITHOUT CONTRAST TECHNIQUE: Multidetector CT imaging of the abdomen and pelvis was performed following the standard protocol without oral or IV contrast. COMPARISON:  May 17, 2017 CT abdomen and pelvis; chest CT January 05, 2017; lumbar MRI January 30, 2018 FINDINGS: Lower chest: There is a persistent nodular opacity in the anterior left base region measuring 1.5 x 1.5 cm with attenuation suggesting cystic type lesion. There is no lung base edema or consolidation. Hepatobiliary: No focal liver lesions are apparent on this noncontrast enhanced study. There is no appreciable biliary duct dilatation. Gallbladder wall is not appreciably thickened. Pancreas: No span creating mass or inflammatory focus. Spleen: There are surgical clips in the spleen, stable. No focal splenic lesions are evident on this noncontrast enhanced study. Adrenals/Urinary Tract: Adrenals bilaterally appear unremarkable. Kidneys bilaterally show no evident mass or hydronephrosis on either side. There is slight nephrocalcinosis bilaterally. There is a 1 mm calculus in the mid left kidney. There is no appreciable ureteral calculus on either side. Urinary bladder is midline with wall thickness within normal limits. Stomach/Bowel: Several jejunal loops show mild wall thickening. No bowel wall thickening elsewhere. There are scattered sigmoid diverticula without diverticulitis. There is no evident bowel obstruction. No free air or portal venous air. Note that most small bowel loops are fluid filled.  Vascular/Lymphatic: There is no appreciable abdominal aortic aneurysm. There is mild aortic atherosclerosis. No calcification is seen in major arterial vascular structures. There is no evident adenopathy in the abdomen or pelvis. Reproductive: There are  a few prostatic calculi. Prostate and seminal vesicles appear normal in size and contour. No evident pelvic mass. Other: Appendix appears normal. No abscess or ascites is evident in the abdomen or pelvis. Musculoskeletal: There is evidence of previous discitis and osteomyelitis at L2-3. There is bony remodeling in these areas. There is moderate arthropathy elsewhere throughout the lumbar and lower thoracic regions. There is stable anterior wedging at L1 and L2. No new bony abnormality evident. No intramuscular lesions are evident. IMPRESSION: 1. Mildly wall thickening of several loops of jejunum, likely due to a degree of enteritis. Most small bowel loops are fluid filled, a finding also that may be seen with enteritis. No bowel obstruction evident. 2.  No abscess in the abdomen or pelvis.  Appendix appears normal. 3. 1 mm calculus in the mid left kidney. Slight nephrocalcinosis bilaterally. No hydronephrosis or ureteral calculus on either side. There are several prostatic calculi. 4.  Postoperative change in spleen. 5. Residua of previous discitis/osteomyelitis at L2-3. No acute appearing bony lesions are evident. No paraspinous lesions seen. Electronically Signed   By: Bretta BangWilliam  Woodruff III M.D.   On: 02/26/2018 06:09    Procedures Procedures (including critical care time)  Medications Ordered in ED Medications  ondansetron (ZOFRAN) injection 4 mg (has no administration in time range)  ketorolac (TORADOL) 30 MG/ML injection 30 mg (has no administration in time range)  morphine 4 MG/ML injection 4 mg (has no administration in time range)     Initial Impression / Assessment and Plan / ED Course  I have reviewed the triage vital signs and the nursing  notes.  Pertinent labs & imaging results that were available during my care of the patient were reviewed by me and considered in my medical decision making (see chart for details).     Lower abdominal pain and flank pain with concern for kidney stone.  Vitals are stable.  Afebrile.  Discussed pain medication in setting of ongoing Suboxone use.  Patient states he is uncomfortable and willing to try pain medication.  Labs are reassuring.  Patient states he cannot urinate.  Bladder scan is 196 mL.  CT scan does not show any ureteral stones.  Does show some thickened bowel loops and nephrolithiasis.  Residual signs of osteomyelitis/discitis.  Low suspicion for new discitis or osteomyelitis today. Low suspicion for cord compression or cauda equina.  Patient continues to complain of left testicular pain though it is nontender to palpation.  Still awaiting urinalysis.  We will plan for ultrasound of testicles.  Care will be transferred to Dr. Jacqulyn BathLong at shift change.   Final Clinical Impressions(s) / ED Diagnoses   Final diagnoses:  Left testicular pain  Left inguinal pain    ED Discharge Orders    None       Julius Boniface, Jeannett SeniorStephen, MD 02/26/18 [phone]

## 2018-02-28 ENCOUNTER — Ambulatory Visit (INDEPENDENT_AMBULATORY_CARE_PROVIDER_SITE_OTHER): Payer: Self-pay | Admitting: Internal Medicine

## 2018-02-28 ENCOUNTER — Other Ambulatory Visit: Payer: Self-pay

## 2018-02-28 DIAGNOSIS — F1120 Opioid dependence, uncomplicated: Secondary | ICD-10-CM

## 2018-02-28 MED ORDER — BUPRENORPHINE HCL-NALOXONE HCL 8-2 MG SL SUBL: 1.0000 | SUBLINGUAL_TABLET | Freq: Two times a day (BID) | SUBLINGUAL | 0 refills | Status: DC

## 2018-02-28 MED FILL — BUPRENORPHIN-NALOXON 8-2 MG: 8-2 | 14 days supply | Qty: 28 | Fill #0

## 2018-02-28 NOTE — Patient Instructions (Signed)
Please return in two weeks.

## 2018-02-28 NOTE — Assessment & Plan Note (Addendum)
Assessment: Given the patient's polysubstance use, life stressors, and many triggers at home I feel he would be an ideal candidate for inpatient treatment facility therapy.  And believes that the [facility] clinic will be able to accommodate this with appropriate documentation and lead time.  Patient continues to utilize other substances despite his desire to decrease this use.    His concomitant use of polysubstances puts him at risk for treatment failure supporting the concept of inpatient substance use disorder therapy if he is able to procure this.   Plan: Continue Suboxone 16mg  daily Return in two weeks Consider U-tox at return

## 2018-02-28 NOTE — Progress Notes (Signed)
   02/28/2018  Joshua Thomas presents for follow up of opioid use disorder I have reviewed the prior induction visit, follow up visits, and telephone encounters relevant to opiate use disorder (OUD) treatment.   Current daily dose: Suboxone 16mg  daily  Date of Induction: 10/04/2017 with continued interruptions   Current follow up interval, in weeks: 2  The patient has been adherent with the buprenorphine for OUD contract.   Last UDS Result: Positive for cocaine and cyclobenzaprine as well as suboxone.   HPI: Joshua Thomas is a 43 year old male who presents today for evaluation and continued treatment of his chronic opioid use disorder. He continues to desire to use substances other than heroin due to many life stressors including chronic pain.  He reports upon realizing that heroin is laced with various chemicals, substances, and a variety of fentanyl doses, he began to avoid his previous drug of choice.  Instead he utilizes cocaine, EtOH, benzodiazepines at various times.  He stated clearly that if he drinks alcohol he feels the need to utilize Xanax and vice versa.  In addition, when using a downer of any sort he feels the need to utilize an upper as well such as cocaine to improve his mood.   The patient is inquiring today as to the possibility of seeking care at a treatment facility.  He is curious as to if the [facility] Suboxone clinic  would be able to postpone his therapy temporarily while away at the treatment facility and resume his therapy if this were previously arranged upon his return.  Exam:   Vitals:   02/28/18 1101  BP: 136/87  Pulse: 82  Temp: 98 F (36.7 C)  TempSrc: Oral  SpO2: 100%  Weight: 160 lb 1.6 oz (72.6 kg)  Height: 5\' 3"  (1.6 m)   General: A/O x4, in no acute distress, afebrile, nondiaphoretic Cardio: RRR, no mrg's Pulmonary: CTA bilaterally MSK: BLE nontender, nonedematous Psych: Appropriate affect, not depressed in appearance, engages well  Assessment/Plan:   See Problem Based Charting in the Encounters Tab  Lanelle Bal, MD  02/28/2018  11:25 AM

## 2018-02-28 NOTE — Progress Notes (Signed)
Internal Medicine Clinic Attending  I saw and evaluated the patient.  I personally confirmed the key portions of the history and exam documented by Dr. Crista Elliot and I reviewed pertinent patient test results.  The assessment, diagnosis, and plan were formulated together and I agree with the documentation in the resident's note.      I advised [name] that a rehab stay would be beneficial to him.  He feels that help with his bipolar disorder and use of other substances including cocaine, could be beneficial.  He is going to seek out a bed at a facility in [place].

## 2018-03-14 ENCOUNTER — Ambulatory Visit (INDEPENDENT_AMBULATORY_CARE_PROVIDER_SITE_OTHER): Payer: Self-pay | Admitting: Internal Medicine

## 2018-03-14 ENCOUNTER — Other Ambulatory Visit: Payer: Self-pay

## 2018-03-14 VITALS — BP 134/96 | HR 97 | Temp 98.1°F | Wt 157.2 lb

## 2018-03-14 DIAGNOSIS — F1120 Opioid dependence, uncomplicated: Secondary | ICD-10-CM

## 2018-03-14 MED ORDER — BUPRENORPHINE HCL-NALOXONE HCL 8-2 MG SL SUBL: 1.0000 | SUBLINGUAL_TABLET | Freq: Two times a day (BID) | SUBLINGUAL | 0 refills | Status: DC

## 2018-03-14 MED FILL — BUPRENORPHIN-NALOXON 8-2 MG: 8-2 | 14 days supply | Qty: 28 | Fill #0

## 2018-03-14 NOTE — Progress Notes (Signed)
   03/14/2018  Joshua Thomas presents for follow up of opioid use disorder I have reviewed the prior induction visit, follow up visits, and telephone encounters relevant to opiate use disorder (OUD) treatment.   Current daily dose: Suboxone 16mg  daily  Date of Induction: 10/04/2017 with continued interruptions   Current follow up interval, in weeks: 2  The patient has been adherent with the buprenorphine for OUD contract.   Last UDS Result: 2/4 Positive for cocaine  as well as suboxone.  2/16 ED screen BZD, Opiate, Cocaine  HPI: Joshua Thomas is a 43 year old male who presents today for evaluation and continued treatment of his chronic opioid use disorder.  He is continued to struggle to stabilize.  However he does report since his last visit that he has not used any illicit opioids.  He does admit to intranasal cocaine use on Saturday he reports this was a friend who visited his house and provided the drug.  He does report the Suboxone has helped him "stop roaming the streets at all hours," and that his cravings are well controlled when he takes it.  However he reports that his old "friends" knows where he lives and stop by occasionally leading him to relapse.  He has had a strained relationship with his children and this is led him to have limited access to his grandchildren.  However he is considering moving to [facility] for rehab treatment/move location to be away from bad influences.  Apparently 1 of his sons lives in [place] area who would provide close supervision.  He is currently living with his mother.  Exam:   Vitals:   03/14/18 0926  BP: (!) 134/96  Pulse: 97  Temp: 98.1 F (36.7 C)  TempSrc: Oral  SpO2: 99%  Weight: 157 lb 3.2 oz (71.3 kg)   General: No distress, calm Cardio: RRR, no mrg's Pulmonary: CTA bilaterally MSK: BLE nontender, nonedematous Psych: Appropriate affect, not depressed in appearance, engages well  Assessment/Plan:  See Problem Based Charting in  the Encounters Tab  Gust Rung, DO  03/14/2018  10:06 AM

## 2018-03-16 NOTE — Assessment & Plan Note (Signed)
Will recheck urine tox screen to evaluate his claim of no illicit opioid use, he did admit to cocaine use. For now we will continue Suboxone he continues to struggle to stabilize although has some promising features.  I encouraged him to consider the move to [facility] as it does appear that cravings are well controlled with Suboxone however his interpersonal relationships are not helping.

## 2018-03-18 LAB — TOXASSURE SELECT,+ANTIDEPR,UR

## 2018-03-27 ENCOUNTER — Telehealth: Payer: Self-pay | Admitting: Internal Medicine

## 2018-03-27 ENCOUNTER — Telehealth: Payer: Self-pay | Admitting: Physician Assistant

## 2018-03-27 MED ORDER — BUPRENORPHINE HCL-NALOXONE HCL 8-2 MG SL SUBL: 1.0000 | SUBLINGUAL_TABLET | Freq: Two times a day (BID) | SUBLINGUAL | 1 refills | Status: DC

## 2018-03-27 MED FILL — BUPRENORPHIN-NALOXON 8-2 MG: 8-2 | 14 days supply | Qty: 28 | Fill #0 | Status: TO

## 2018-03-27 NOTE — Telephone Encounter (Signed)
Due to COVID-19 concerns, we will cancel OUD follow up.  Have provided 4 week Rx (2 week with 1 refill) to continue suboxone.

## 2018-04-01 ENCOUNTER — Emergency Department (HOSPITAL_COMMUNITY)
Admission: EM | Admit: 2018-04-01 | Discharge: 2018-04-01 | Disposition: A | Discharge status: Home / Self Care | Payer: Self-pay | Attending: Emergency Medicine | Admitting: Emergency Medicine

## 2018-04-01 ENCOUNTER — Emergency Department (HOSPITAL_COMMUNITY): Payer: Self-pay

## 2018-04-01 ENCOUNTER — Encounter (HOSPITAL_COMMUNITY): Payer: Self-pay | Admitting: Emergency Medicine

## 2018-04-01 ENCOUNTER — Other Ambulatory Visit: Payer: Self-pay

## 2018-04-01 DIAGNOSIS — I10 Essential (primary) hypertension: Secondary | ICD-10-CM | POA: Insufficient documentation

## 2018-04-01 DIAGNOSIS — E119 Type 2 diabetes mellitus without complications: Secondary | ICD-10-CM | POA: Insufficient documentation

## 2018-04-01 DIAGNOSIS — R109 Unspecified abdominal pain: Secondary | ICD-10-CM | POA: Insufficient documentation

## 2018-04-01 DIAGNOSIS — Z79899 Other long term (current) drug therapy: Secondary | ICD-10-CM | POA: Insufficient documentation

## 2018-04-01 DIAGNOSIS — F17210 Nicotine dependence, cigarettes, uncomplicated: Secondary | ICD-10-CM | POA: Insufficient documentation

## 2018-04-01 DIAGNOSIS — Z9119 Patient's noncompliance with other medical treatment and regimen: Secondary | ICD-10-CM | POA: Insufficient documentation

## 2018-04-01 DIAGNOSIS — F1410 Cocaine abuse, uncomplicated: Secondary | ICD-10-CM | POA: Insufficient documentation

## 2018-04-01 DIAGNOSIS — J449 Chronic obstructive pulmonary disease, unspecified: Secondary | ICD-10-CM | POA: Insufficient documentation

## 2018-04-01 DIAGNOSIS — N201 Calculus of ureter: Secondary | ICD-10-CM | POA: Insufficient documentation

## 2018-04-01 DIAGNOSIS — R918 Other nonspecific abnormal finding of lung field: Secondary | ICD-10-CM | POA: Insufficient documentation

## 2018-04-01 LAB — CBC WITH DIFFERENTIAL/PLATELET
Abs Immature Granulocytes: 0.01 K/uL (ref 0.00–0.07)
Basophils Absolute: 0 K/uL (ref 0.0–0.1)
Basophils Relative: 0 %
Eosinophils Absolute: 0.2 K/uL (ref 0.0–0.5)
Eosinophils Relative: 3 %
HCT: 40.9 % (ref 39.0–52.0)
Hemoglobin: 13 g/dL (ref 13.0–17.0)
Immature Granulocytes: 0 %
Lymphocytes Relative: 53 %
Lymphs Abs: 2.6 K/uL (ref 0.7–4.0)
MCH: 31 pg (ref 26.0–34.0)
MCHC: 31.8 g/dL (ref 30.0–36.0)
MCV: 97.6 fL (ref 80.0–100.0)
Monocytes Absolute: 0.4 K/uL (ref 0.1–1.0)
Monocytes Relative: 9 %
Neutro Abs: 1.8 K/uL (ref 1.7–7.7)
Neutrophils Relative %: 35 %
Platelets: 163 K/uL (ref 150–400)
RBC: 4.19 MIL/uL — ABNORMAL LOW (ref 4.22–5.81)
RDW: 13.7 % (ref 11.5–15.5)
WBC: 5 K/uL (ref 4.0–10.5)
nRBC: 0 % (ref 0.0–0.2)

## 2018-04-01 LAB — URINALYSIS, ROUTINE W REFLEX MICROSCOPIC
Bacteria, UA: NONE SEEN
Bilirubin Urine: NEGATIVE
Glucose, UA: NEGATIVE mg/dL
Ketones, ur: NEGATIVE mg/dL
Leukocytes,Ua: NEGATIVE
Nitrite: NEGATIVE
Protein, ur: NEGATIVE mg/dL
Specific Gravity, Urine: 1.002 — ABNORMAL LOW (ref 1.005–1.030)
pH: 7 (ref 5.0–8.0)

## 2018-04-01 LAB — RAPID URINE DRUG SCREEN, HOSP PERFORMED
Amphetamines: NOT DETECTED
Barbiturates: NOT DETECTED
Benzodiazepines: NOT DETECTED
Cocaine: POSITIVE — AB
OPIATES: NOT DETECTED
Tetrahydrocannabinol: NOT DETECTED

## 2018-04-01 LAB — COMPREHENSIVE METABOLIC PANEL WITH GFR
ALT: 73 U/L — ABNORMAL HIGH (ref 0–44)
AST: 37 U/L (ref 15–41)
Albumin: 3.5 g/dL (ref 3.5–5.0)
Alkaline Phosphatase: 55 U/L (ref 38–126)
Anion gap: 8 (ref 5–15)
BUN: 10 mg/dL (ref 6–20)
CO2: 25 mmol/L (ref 22–32)
Calcium: 9 mg/dL (ref 8.9–10.3)
Chloride: 105 mmol/L (ref 98–111)
Creatinine, Ser: 0.8 mg/dL (ref 0.61–1.24)
GFR calc Af Amer: >60 mL/min
GFR calc non Af Amer: >60 mL/min
Glucose, Bld: 105 mg/dL — ABNORMAL HIGH (ref 70–99)
Potassium: 3.7 mmol/L (ref 3.5–5.1)
Sodium: 138 mmol/L (ref 135–145)
Total Bilirubin: 0.1 mg/dL — ABNORMAL LOW (ref 0.3–1.2)
Total Protein: 6.9 g/dL (ref 6.5–8.1)

## 2018-04-01 NOTE — ED Triage Notes (Signed)
Pt reports having pain in both left and right flank area for the last several days.

## 2018-04-01 NOTE — Discharge Instructions (Signed)
Take your usual medication, as instructed.  Follow-up with your doctor if not better, as needed, and for checkup in 1 week.

## 2018-04-01 NOTE — ED Provider Notes (Signed)
[facility]-EMERGENCY DEPT Provider Note   CSN: [id number] Arrival date & time: 04/01/18  2219    History   Chief Complaint Chief Complaint  Patient presents with  . Flank Pain    HPI Joshua Thomas is a 43 y.o. male.     HPI    He presents for evaluation of left flank pain that comes and goes for the last 3 days, lasting between 15 and 30 minutes each time.  Pain also radiates to the right flank and his lower abdomen.  He denies nausea, vomiting, cough, shortness of breath, fever, chills, hematuria, dysuria, weakness or dizziness.  He states that he is not currently taking any medications however he had a prescription for Suboxone filled, 5 days ago.  There are no other known modifying factors.  Past Medical History:  Diagnosis Date  . ADHD (attention deficit hyperactivity disorder)   . Bipolar 1 disorder (HCC)   . Chronic back pain   . Chronic pain    chronic l leg pain  . 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 COPD (chronic obstructive pulmonary disease) (HCC)   . DDD (degenerative disc disease), lumbar   . Degenerative disc disease, lumbar   . Depression   . Discitis   . GERD (gastroesophageal reflux disease)   . Hepatitis C   . Hepatitis C   . History of stomach ulcers   . Hx of diabetes mellitus    due to infection  . Hypertension   . Lung nodules    3 on L and 2 on R  . PTSD (post-traumatic stress disorder)   . Sciatica   . Substance abuse [facility])     Patient Active Problem List   Diagnosis Date Noted  . Chronic low back pain 09/23/2017  . Essential hypertension 09/23/2017  . Hyperglycemia 06/03/2017  . GERD (gastroesophageal reflux disease) 05/17/2017  . Bipolar disorder (HCC) 05/17/2017  . Opioid use disorder, moderate, dependence (HCC) 05/23/2015  . Chronic hepatitis C virus genotype 1a infection (HCC)     Past Surgical History:  Procedure Laterality Date  . FRACTURE SURGERY    . HARDWARE REMOVAL   01/27/2012   Procedure: HARDWARE REMOVAL;  Surgeon: Kathryne Hitch, MD;  Location: [facility] ORS;  Service: Orthopedics;  Laterality: Left;  . IR LUMBAR DISC ASPIRATION W/IMG GUIDE  06/08/2017  . RADIOLOGY WITH ANESTHESIA N/A 06/08/2017   Procedure: DISC ASPIRATION;  Surgeon: Julieanne Cotton, MD;  Location: [facility] OR;  Service: Radiology;  Laterality: N/A;  . TIBIA IM NAIL INSERTION  01/27/2012   Procedure: INTRAMEDULLARY (IM) NAIL TIBIAL;  Surgeon: Kathryne Hitch, MD;  Location: [facility];  Service: Orthopedics;  Laterality: Left;  Removal of IM Rod and Screws Left Tibia with Exchange Nail, Allograft Bone Graft Left Tibia        Home Medications    Prior to Admission medications   Medication Sig Start Date End Date Taking? Authorizing Provider  buprenorphine-naloxone (SUBOXONE) 8-2 mg SUBL SL tablet Place 1 tablet under the tongue 2 (two) times daily. 03/27/18   Gust Rung, DO  cyclobenzaprine (FLEXERIL) 10 MG tablet Take 0.5 tablets (5 mg total) by mouth 3 (three) times daily as needed for muscle spasms. 02/14/18   Gust Rung, DO  DULoxetine (CYMBALTA) 30 MG capsule Please take 30mg  (1 tablet) daily for one week and then increase to 60mg  (2 tablets) daily. 02/14/18   Lanelle Bal, MD    Family History Family  History  Problem Relation Age of Onset  . Diabetes Mother     Social History Social History   Tobacco Use  . Smoking status: Current Every Day Smoker    Packs/day: 1.00    Years: 33.00    Pack years: 33.00    Types: E-cigarettes, Cigarettes  . Smokeless tobacco: Former Neurosurgeon    Quit date: 08/08/2006  . Tobacco comment: 1 PPD  Substance Use Topics  . Alcohol use: Yes    Comment: occasionally  . Drug use: Yes    Types: Cocaine, IV    Comment: Pt states he "shoots whatever is available".  last used 1 week ago      Allergies   Patient has no known allergies.   Review of Systems Review of Systems  All other systems reviewed and are negative.     Physical Exam Updated Vital Signs BP (!) 148/91 (BP Location: Left Arm)   Pulse 81   Temp 98.7 F (37.1 C) (Oral)   Resp 18   Ht  (1.6 m)   Wt 72.6 kg   SpO2 100%   BMI 28.34 kg/m   Physical Exam Vitals signs and nursing note reviewed.  Constitutional:      General: He is in acute distress.     Appearance: Normal appearance. He is well-developed. He is not ill-appearing, toxic-appearing or diaphoretic.  HENT:     Head: Normocephalic and atraumatic.     Right Ear: External ear normal.     Left Ear: External ear normal.     Nose: No congestion.     Mouth/Throat:     Mouth: Mucous membranes are moist.     Pharynx: No oropharyngeal exudate or posterior oropharyngeal erythema.  Eyes:     Conjunctiva/sclera: Conjunctivae normal.     Pupils: Pupils are equal, round, and reactive to light.  Neck:     Musculoskeletal: Normal range of motion and neck supple.     Trachea: Phonation normal.  Cardiovascular:     Rate and Rhythm: Normal rate and regular rhythm.     Heart sounds: Normal heart sounds.  Pulmonary:     Effort: Pulmonary effort is normal.     Breath sounds: Normal breath sounds.  Abdominal:     General: There is no distension.     Palpations: Abdomen is soft. There is no mass.     Tenderness: There is no abdominal tenderness. There is no guarding.  Genitourinary:    Comments: Left costovertebral angle tenderness, mild. Musculoskeletal: Normal range of motion.     Comments: Normal gait.  Normal strength arms and legs, bilaterally.  Skin:    General: Skin is warm and dry.  Neurological:     Mental Status: He is alert and oriented to person, place, and time.     Cranial Nerves: No cranial nerve deficit.     Sensory: No sensory deficit.     Motor: No abnormal muscle tone.     Coordination: Coordination normal.  Psychiatric:        Mood and Affect: Mood normal.        Behavior: Behavior normal.        Thought Content: Thought content normal.        Judgment:  Judgment normal.      ED Treatments / Results  Labs (all labs ordered are listed, but only abnormal results are displayed) Labs Reviewed  COMPREHENSIVE METABOLIC PANEL - Abnormal; Notable for the following components:      Result  Value   Glucose, Bld 105 (*)    ALT 73 (*)    Total Bilirubin 0.1 (*)    All other components within normal limits  CBC WITH DIFFERENTIAL/PLATELET - Abnormal; Notable for the following components:   RBC 4.19 (*)    All other components within normal limits  URINALYSIS, ROUTINE W REFLEX MICROSCOPIC - Abnormal; Notable for the following components:   Color, Urine COLORLESS (*)    Specific Gravity, Urine 1.002 (*)    Hgb urine dipstick SMALL (*)    All other components within normal limits  RAPID URINE DRUG SCREEN, HOSP PERFORMED - Abnormal; Notable for the following components:   Cocaine POSITIVE (*)    All other components within normal limits    EKG None  Radiology Dg Chest 2 View  Result Date: 04/01/2018 CLINICAL DATA:  Chest tightness. EXAM: CHEST - 2 VIEW COMPARISON:  Chest CT 01/20/2018 FINDINGS: Cardiomediastinal silhouette is normal. Mediastinal contours appear intact. There is no evidence of focal airspace consolidation, pleural effusion or pneumothorax. Left lower lobe pulmonary mass measures 18 mm radiographically. Osseous structures are without acute abnormality. Soft tissues are grossly normal. IMPRESSION: Left lower lobe pulmonary mass measures 18 mm radiographically. No acute pulmonary findings. Electronically Signed   By: Ted Mcalpine M.D.   On: 04/01/2018 22:55   Ct Renal Stone Study  Result Date: 04/01/2018 CLINICAL DATA:  BILATERAL flank and abdominal pain, suspected recurrent stone disease EXAM: CT ABDOMEN AND PELVIS WITHOUT CONTRAST TECHNIQUE: Multidetector CT imaging of the abdomen and pelvis was performed following the standard protocol without IV contrast. Sagittal and coronal MPR images reconstructed from axial data set.  Oral contrast was not administered. COMPARISON:  02/26/2018 FINDINGS: Lower chest: Persistent smoothly marginated noncalcified nodular density at anterior aspect of LEFT lower lobe laterally 1.5 x 1.4 cm unchanged since previous exam; this measured 20 x 17 mm on 01/05/2017. Hepatobiliary: Contracted gallbladder.  Liver normal appearance. Pancreas: Normal appearance Spleen: 2 metallic foreign bodies question surgical clips. Spleen otherwise unremarkable. Small adjacent splenule. Adrenals/Urinary Tract: Adrenal glands normal appearance. Tiny nonobstructing calculus and small cyst within LEFT kidney. Kidneys otherwise normal appearance. No hydronephrosis or hydroureter. 7 x 4 mm diameter calcific density is identified at in the RIGHT pelvis most consistent with nonobstructing calculus unchanged from prior exam; this was present in the RIGHT kidney not the distal RIGHT ureter on an earlier CT of 05/17/2017. Decompressed LEFT ureter and bladder. Stomach/Bowel: Normal appendix. Stomach and bowel loops normal appearance. Vascular/Lymphatic: Minimal atherosclerotic calcification aorta without aneurysm. No adenopathy. Reproductive: Mild prostatic enlargement. Question high-riding RIGHT testis versus fluid within RIGHT inguinal canal. Other: No free air or free fluid.  No acute inflammatory process. Musculoskeletal: Bones unremarkable IMPRESSION: 7 x 4 mm diameter calcification is seen in the RIGHT pelvis unchanged since 02/26/2018 though new since 05/17/2017; this likely represents a nonobstructing distal RIGHT ureteral calculus, with a calcification of similar size seen within the RIGHT kidney on the 05/17/2017 exam. Mild prostatic enlargement. Stable LEFT lower lobe nodule 15 x 14 mm since 02/26/2018, slightly decreased since 01/05/2017, recommend continued follow-up. Electronically Signed   By: Ulyses Southward M.D.   On: 04/01/2018 23:13    Procedures Procedures (including critical care time)  Medications Ordered in ED  Medications - No data to display   Initial Impression / Assessment and Plan / ED Course  I have reviewed the triage vital signs and the nursing notes.  Pertinent labs & imaging results that were available during my care of  the patient were reviewed by me and considered in my medical decision making (see chart for details).  Clinical Course as of Mar 31 2336  Sat Apr 01, 2018  2309 Normal except small amount of hemoglobin  Urinalysis, Routine w reflex microscopic(!) [EW]  2309 No infiltrate or CHF, left lower lobe pulmonary mass, images reviewed by me  DG Chest 2 View [EW]  2322 Abnormal, cocaine present  Urine rapid drug screen (hosp performed)(!) [EW]  2323 Normal  CBC with Differential(!) [EW]  2327 Unchanged distal right ureteral stone without obstruction, and left lower lobe mass; images reviewed by me  CT Renal Stone Study [EW]  2330 Normal except mild glucose elevation, increased ALT, and low total bilirubin  Comprehensive metabolic panel(!) [EW]    Clinical Course User Index [EW] Mancel Bale, MD        Patient Vitals for the past 24 hrs:  BP Temp Temp src Pulse Resp SpO2 Height Weight  04/01/18 2228 - - - - - -  (1.6 m) 72.6 kg  04/01/18 2226 (!) 148/91 98.7 F (37.1 C) Oral 81 18 100 % - -    11:38 PM Reevaluation with update and discussion. After initial assessment and treatment, an updated evaluation reveals no change in clinical status, findings discussed with patient and all questions were answered. Mancel Bale   Medical Decision Making: Nonspecific left flank pain, with reassuring evaluation showed chronic stable abnormalities left lower lung, right distal ureter.  Doubt UTI, serious bacterial infection or impending vascular collapse.  CRITICAL CARE-no Performed by: Mancel Bale  Nursing Notes Reviewed/ Care Coordinated Applicable Imaging Reviewed Interpretation of Laboratory Data incorporated into ED treatment  The patient appears reasonably  screened and/or stabilized for discharge and I doubt any other medical condition or other [facility] requiring further screening, evaluation, or treatment in the ED at this time prior to discharge.  Plan: Home Medications-continue usual; Home Treatments-rest, fluids; return here if the recommended treatment, does not improve the symptoms; Recommended follow up-PCP and urology follow-up   Final Clinical Impressions(s) / ED Diagnoses   Final diagnoses:  Flank pain  Cocaine abuse (HCC)  Right ureteral stone    ED Discharge Orders    None       Mancel Bale, MD 04/01/18 2340

## 2018-04-01 NOTE — ED Notes (Signed)
Patient given discharge teaching and verbalized understanding. Patient ambulated out of ED with a steady gait. 

## 2018-04-01 NOTE — ED Notes (Signed)
Patient transported to X-ray 

## 2018-04-10 ENCOUNTER — Telehealth: Payer: Self-pay | Admitting: Student in an Organized Health Care Education/Training Program

## 2018-04-10 MED FILL — BUPRENORPHIN-NALOXON 8-2 MG: 8-2 | 14 days supply | Qty: 28 | Fill #0

## 2018-04-10 NOTE — Telephone Encounter (Signed)
Trying to get in touch with the patient to do a telephone visit for his opioid use disorder appointments.  Number in the chart goes to his mother.  She says she does not have a phone number for me right now, he changes his number frequently.  She is going to send him a message through Facebook to call us back at the clinic.  Please route to me when he does call.

## 2018-04-24 ENCOUNTER — Other Ambulatory Visit: Payer: Self-pay | Admitting: Internal Medicine

## 2018-04-24 ENCOUNTER — Other Ambulatory Visit: Payer: Self-pay

## 2018-04-24 MED ORDER — BUPRENORPHINE HCL-NALOXONE HCL 8-2 MG SL SUBL: 1.0000 | SUBLINGUAL_TABLET | Freq: Two times a day (BID) | SUBLINGUAL | 0 refills | Status: DC

## 2018-04-24 MED FILL — BUPRENORPHINE HCL-NALOXONE: 8-2 | 14 days supply | Qty: 28 | Fill #0

## 2018-04-24 NOTE — Telephone Encounter (Signed)
Will send 1 refill to [facility].  However, his last Rx was only for a 2 week supply, and that was 1 month ago.  He will need to speak to one of [place] prior to any further refills as he has had issues with continued drug use.   I would prefer to refill for risk reduction for him at this time, but if he fails to stabilize he may require a stay in rehab.  Has concomitant mental health issues.

## 2018-04-24 NOTE — Telephone Encounter (Signed)
Pt states buprenorphine-naloxone (SUBOXONE) 8-2 mg SUBL SL tablet is not at the [facility]. Pt would like a call back.

## 2018-04-24 NOTE — Telephone Encounter (Signed)
Pt wants to pick up suboxone, the script last sent 3/16 had 1 refill but it went to [facility], needs to [facility]. Can we send 1 to [facility]

## 2018-04-24 NOTE — Telephone Encounter (Signed)
Attempted to call pt to tell him to f/u via ph about 3-4 days before the end of his script, answered but hung up

## 2018-05-09 ENCOUNTER — Other Ambulatory Visit: Payer: Self-pay

## 2018-05-09 NOTE — Telephone Encounter (Signed)
buprenorphine-naloxone (SUBOXONE) 8-2 mg SUBL SL tablet, refill request @  [facility] - [place], [place] - [address] [place] [phone] (Phone) [phone] (Fax)

## 2018-05-10 MED ORDER — BUPRENORPHINE HCL-NALOXONE HCL 8-2 MG SL SUBL: 1.0000 | SUBLINGUAL_TABLET | Freq: Two times a day (BID) | SUBLINGUAL | 1 refills | Status: DC

## 2018-05-10 MED FILL — BUPRENORPHIN-NALOXON 8-2 MG: 8-2 | 14 days supply | Qty: 28 | Fill #0 | Status: TO

## 2018-05-10 NOTE — Telephone Encounter (Signed)
Called and spoke with Joshua Thomas.  Confirmed identity with name and birthday.  He was confused in that he did not know that we were wanting to talk with him prior to a refills.   He reports being 1 week clean.   His mom bought him a chain saw and he has been using it to clear a lot.  He has been staying home and that his helping him to stay clean. Cravings under control when he is on suboxone.  He is having some withdrawal as he has not had suboxone for a day now.   He feels like he is stabilizing somewhat and does not desire to continue to use heroin, however, he feels his cravings sometimes get the best of him when he is in between doses.    Refill buprenorphine today for 14 day supply and 1 refill.   Called back in 3 weeks to see how he is doing.   In person appointment when available.   Debe Coder, MD

## 2018-05-11 MED FILL — CYCLOBENZAPRINE HCL 10 MG T: 10 | 13 days supply | Qty: 20 | Fill #0 | Status: TO

## 2018-05-11 MED FILL — DULoxetine HCL 30 MG CPEP: 30 | 30 days supply | Qty: 60 | Fill #0 | Status: TO

## 2018-06-06 ENCOUNTER — Other Ambulatory Visit: Payer: Self-pay

## 2018-06-06 MED FILL — BUPRENORPHIN-NALOXON 8-2 MG: 8-2 | 14 days supply | Qty: 28 | Fill #0

## 2018-06-06 MED FILL — CYCLOBENZAPRINE 10 MG TAB: 10 | 13 days supply | Qty: 20 | Fill #0

## 2018-06-06 NOTE — Telephone Encounter (Signed)
Requesting to speak with a nurse about meds. Please call back.  

## 2018-06-08 NOTE — Telephone Encounter (Signed)
Every time call this # it goes straight to vmail, have left 2 messages

## 2018-06-09 NOTE — Telephone Encounter (Signed)
Reviewed database, just filled suboxone Rx 3 days ago

## 2018-06-09 NOTE — Telephone Encounter (Signed)
Called pt this am, states his phone has some cracks in it and sometimes does not work right. appt is set for 6/30 at 0915. He states he will be needing refills

## 2018-06-19 ENCOUNTER — Other Ambulatory Visit: Payer: Self-pay

## 2018-06-19 ENCOUNTER — Encounter (HOSPITAL_COMMUNITY): Payer: Self-pay | Admitting: Emergency Medicine

## 2018-06-19 ENCOUNTER — Emergency Department (HOSPITAL_COMMUNITY)
Admission: EM | Admit: 2018-06-19 | Discharge: 2018-06-19 | Disposition: A | Discharge status: Home / Self Care | Payer: Self-pay | Attending: Emergency Medicine | Admitting: Emergency Medicine

## 2018-06-19 DIAGNOSIS — Z79899 Other long term (current) drug therapy: Secondary | ICD-10-CM | POA: Insufficient documentation

## 2018-06-19 DIAGNOSIS — Z23 Encounter for immunization: Secondary | ICD-10-CM | POA: Insufficient documentation

## 2018-06-19 DIAGNOSIS — Y939 Activity, unspecified: Secondary | ICD-10-CM | POA: Insufficient documentation

## 2018-06-19 DIAGNOSIS — F17210 Nicotine dependence, cigarettes, uncomplicated: Secondary | ICD-10-CM | POA: Insufficient documentation

## 2018-06-19 DIAGNOSIS — J449 Chronic obstructive pulmonary disease, unspecified: Secondary | ICD-10-CM | POA: Insufficient documentation

## 2018-06-19 DIAGNOSIS — R21 Rash and other nonspecific skin eruption: Secondary | ICD-10-CM | POA: Insufficient documentation

## 2018-06-19 DIAGNOSIS — W57XXXA Bitten or stung by nonvenomous insect and other nonvenomous arthropods, initial encounter: Secondary | ICD-10-CM | POA: Insufficient documentation

## 2018-06-19 DIAGNOSIS — Y999 Unspecified external cause status: Secondary | ICD-10-CM | POA: Insufficient documentation

## 2018-06-19 DIAGNOSIS — I10 Essential (primary) hypertension: Secondary | ICD-10-CM | POA: Insufficient documentation

## 2018-06-19 DIAGNOSIS — Y929 Unspecified place or not applicable: Secondary | ICD-10-CM | POA: Insufficient documentation

## 2018-06-19 MED ORDER — TETANUS-DIPHTH-ACELL PERTUSSIS 5-2.5-18.5 LF-MCG/0.5 IM SUSP
0.5000 mL | Freq: Once | INTRAMUSCULAR | Status: AC
  Administered 2018-06-19: 0.5 mL via INTRAMUSCULAR
  Filled 2018-06-19: qty 0.5

## 2018-06-19 MED ORDER — SULFAMETHOXAZOLE-TRIMETHOPRIM 800-160 MG PO TABS: 1.0000 | ORAL_TABLET | Freq: Two times a day (BID) | ORAL | 0 refills | Status: AC

## 2018-06-19 NOTE — Discharge Instructions (Addendum)
Please take all of your antibiotics until finished.   You may develop abdominal discomfort or nausea from the antibiotic. If this occurs, you may take it with food. Some patients also get diarrhea with antibiotics. You may help offset this with probiotics which you can buy or get in yogurt. Do not eat or take the probiotics until 2 hours after your antibiotic.   Some people develop allergies to antibiotics. Symptoms of antibiotic allergy can be mild and include a flat rash and itching. They can also be more serious and include:  ?Hives - Hives are raised, red patches of skin that are usually very itchy.  ?Lip or tongue swelling  ?Trouble swallowing or breathing  ?Blistering of the skin or mouth.  If you have any of these serious symptoms, please seek emergency medical care immediately.   You may take Benadryl, 25 mg every 6 hours as needed for itching.  This medication can make you sleepy.  Please do not drive, drink alcohol operate machinery while taking this.  Back to the emergency department if you notice any further swelling of your genitals, worsening itching, redness, or new or worsening symptoms.   Thank you for allowing [place] to participate in your care today.

## 2018-06-19 NOTE — ED Triage Notes (Signed)
Tick removal from back left shoulder L thigh one week ago  Reports here for eval    Pain 7/10  No PCP

## 2018-06-19 NOTE — ED Provider Notes (Signed)
[facility] EMERGENCY DEPARTMENT Provider Note   CSN: [id number] Arrival date & time: 06/19/18  1307    History   Chief Complaint Chief Complaint  Patient presents with  . Tick Removal    HPI Naida SleightLee W Hummel is a 43 y.o. male.     HPI  Patient is a 43 year old male past medical history polysubstance use, hepatitis C, bipolar 1 disorder, PTSD presenting for multiple tick bites.  He reports that he had 2-3 of his left shoulder as well as multiple on his penile shaft and testicles.  Patient reports that the ones on his shoulders occurred about a week ago but the ones on his scrotum and penile shaft occurred in 48 hours.  He reports erythema, itching, and swelling.  Patient denies any penile discharge, penile growths, or vesicular lesions.  He denies any recent sexual activity.  Patient ports all ticks were removed, and none of them were engorged.  Last tetanus shot unknown.  Past Medical History:  Diagnosis Date  . ADHD (attention deficit hyperactivity disorder)   . Bipolar 1 disorder (HCC)   . Chronic back pain   . Chronic pain    chronic l leg pain  . 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 COPD (chronic obstructive pulmonary disease) (HCC)   . DDD (degenerative disc disease), lumbar   . Degenerative disc disease, lumbar   . Depression   . Discitis   . GERD (gastroesophageal reflux disease)   . Hepatitis C   . Hepatitis C   . History of stomach ulcers   . Hx of diabetes mellitus    due to infection  . Hypertension   . Lung nodules    3 on L and 2 on R  . PTSD (post-traumatic stress disorder)   . Sciatica   . Substance abuse [facility](HCC)     Patient Active Problem List   Diagnosis Date Noted  . Chronic low back pain 09/23/2017  . Essential hypertension 09/23/2017  . Hyperglycemia 06/03/2017  . GERD (gastroesophageal reflux disease) 05/17/2017  . Bipolar disorder (HCC) 05/17/2017  . Opioid use disorder, moderate, dependence (HCC) 05/23/2015  .  Chronic hepatitis C virus genotype 1a infection (HCC)     Past Surgical History:  Procedure Laterality Date  . FRACTURE SURGERY    . HARDWARE REMOVAL  01/27/2012   Procedure: HARDWARE REMOVAL;  Surgeon: Kathryne Hitchhristopher Y Blackman, MD;  Location: [facility];  Service: Orthopedics;  Laterality: Left;  . IR LUMBAR DISC ASPIRATION W/IMG GUIDE  06/08/2017  . RADIOLOGY WITH ANESTHESIA N/A 06/08/2017   Procedure: DISC ASPIRATION;  Surgeon: Julieanne Cottoneveshwar, Sanjeev, MD;  Location: [facility] OR;  Service: Radiology;  Laterality: N/A;  . TIBIA IM NAIL INSERTION  01/27/2012   Procedure: INTRAMEDULLARY (IM) NAIL TIBIAL;  Surgeon: Kathryne Hitchhristopher Y Blackman, MD;  Location: [facility];  Service: Orthopedics;  Laterality: Left;  Removal of IM Rod and Screws Left Tibia with Exchange Nail, Allograft Bone Graft Left Tibia        Home Medications    Prior to Admission medications   Medication Sig Start Date End Date Taking? Authorizing Provider  buprenorphine-naloxone (SUBOXONE) 8-2 mg SUBL SL tablet Place 1 tablet under the tongue 2 (two) times daily. 05/10/18   Inez CatalinaMullen, Emily B, MD  cyclobenzaprine (FLEXERIL) 10 MG tablet Take 0.5 tablets (5 mg total) by mouth 3 (three) times daily as needed for muscle spasms. 02/14/18   Gust RungHoffman, Erik C, DO  DULoxetine (CYMBALTA) 30 MG capsule Please take 30mg  (  1 tablet) daily for one week and then increase to 60mg  (2 tablets) daily. 02/14/18   Lanelle BalHarbrecht, Lawrence, MD    Family History Family History  Problem Relation Age of Onset  . Diabetes Mother     Social History Social History   Tobacco Use  . Smoking status: Current Every Day Smoker    Packs/day: 1.00    Years: 33.00    Pack years: 33.00    Types: E-cigarettes, Cigarettes  . Smokeless tobacco: Former NeurosurgeonUser    Quit date: 08/08/2006  . Tobacco comment: 1 PPD  Substance Use Topics  . Alcohol use: Yes    Comment: occasionally  . Drug use: Yes    Types: Cocaine, IV    Comment: Pt states he "shoots whatever is available".  last used 1 week  ago      Allergies   Patient has no known allergies.   Review of Systems Review of Systems  Constitutional: Negative for chills and fever.  Gastrointestinal: Negative for nausea and vomiting.  Genitourinary: Positive for genital sores.  Skin: Positive for rash.     Physical Exam Updated Vital Signs BP (!) 127/94 (BP Location: Right Arm)   Pulse (!) 53   Temp 97.8 F (36.6 C) (Oral)   Resp 14   Ht 5\' 3"  (1.6 m)   Wt 72.6 kg   SpO2 100%   BMI 28.34 kg/m   Physical Exam Vitals signs and nursing note reviewed.  Constitutional:      General: He is not in acute distress.    Appearance: He is well-developed. He is not diaphoretic.     Comments: Sitting comfortably in bed.  HENT:     Head: Normocephalic and atraumatic.     Mouth/Throat:     Mouth: Mucous membranes are moist.  Eyes:     General:        Right eye: No discharge.        Left eye: No discharge.     Conjunctiva/sclera: Conjunctivae normal.     Comments: EOMs normal to gross examination.  Neck:     Musculoskeletal: Normal range of motion.  Cardiovascular:     Rate and Rhythm: Normal rate and regular rhythm.     Comments: Intact, 2+ radial pulse. Abdominal:     General: There is no distension.     Tenderness: There is no abdominal tenderness.  Genitourinary:    Comments: Genital exam performed with nurse tech chaperone present.  Patient has no lesions of bilateral testes.  Dorsum of penis demonstrates mild erythema and swelling with punctate areas localized erythema.  No circumferential swelling.  Incidental finding of condyloma acuminatum of ventral penis.  Musculoskeletal: Normal range of motion.     Comments: Punctate scabs of posterior left shoulder. No bulls-eye lesions.   Skin:    General: Skin is warm and dry.  Neurological:     Mental Status: He is alert.     Comments: Cranial nerves intact to gross observation. Patient moves extremities without difficulty.  Psychiatric:        Behavior:  Behavior normal.        Thought Content: Thought content normal.        Judgment: Judgment normal.      ED Treatments / Results  Labs (all labs ordered are listed, but only abnormal results are displayed) Labs Reviewed - No data to display  EKG None  Radiology No results found.  Procedures Procedures (including critical care time)  Medications Ordered in ED  Medications - No data to display   Initial Impression / Assessment and Plan / ED Course  I have reviewed the triage vital signs and the nursing notes.  Pertinent labs & imaging results that were available during my care of the patient were reviewed by me and considered in my medical decision making (see chart for details).        This is a well-appearing 43 year old male with past medical history of hepatitis C, polysubstance use, bipolar disorder presenting for multiple tick bites.  Patient ports no symptomatic lesions around his penis.  Patient does have some mild swelling and erythema overlying the dorsum of the penis.  Not consistent with STI.  Not consistent with balanitis, as there are no lesions of the head of the penis.  Patient is not diabetic.  Low suspicion for Lyme disease, and patient has no bull's-eye target lesions. Case discussed with Dr. Isla Pence and will prescribe Bactrim.  Patient given to precautions for any increasing swelling, pain, or erythema.  Patient is in understanding and agrees with the plan of care.  Final Clinical Impressions(s) / ED Diagnoses   Final diagnoses:  Tick bite, initial encounter  Rash of penis    ED Discharge Orders         Ordered    sulfamethoxazole-trimethoprim (BACTRIM DS) 800-160 MG tablet  2 times daily     06/19/18 1641           Tamala Julian 06/19/18 1721    Isla Pence, MD 06/19/18 [phone]

## 2018-06-27 ENCOUNTER — Other Ambulatory Visit: Payer: Self-pay | Admitting: Internal Medicine

## 2018-06-27 MED FILL — BUPRENORPHIN-NALOXON 8-2 MG: 8-2 | 14 days supply | Qty: 28 | Fill #0

## 2018-06-29 MED FILL — DULoxetine HCL 30 MG CPEP: 30 | 30 days supply | Qty: 60 | Fill #0

## 2018-07-12 ENCOUNTER — Other Ambulatory Visit: Payer: Self-pay | Admitting: Internal Medicine

## 2018-07-12 NOTE — Telephone Encounter (Signed)
I agree with Dr. Heber [place]. He has not been seen since March due to [place]. According to the database he has been filling suboxone, though with some gaps. He missed his OUD appointment yesterday. Bonnita Nasuti, do we know why he missed yesterday's appointment. We will need him to reach out to [place] with what is going on with him right now, and get him into OUD as soon as able. I am ok giving him a pass on not being seen during COVID, but missing yesterday is a red flag.

## 2018-07-12 NOTE — Telephone Encounter (Signed)
Patient missed appointment in [place], has history of poor adherence with our clinic, will need to discuss plan with Dr Daryll Drown and Dr Evette Doffing.

## 2018-07-13 MED FILL — BUPRENORPHIN-NALOXON 8-2 MG: 8-2 | 7 days supply | Qty: 14 | Fill #0

## 2018-07-13 NOTE — Telephone Encounter (Signed)
Ok. I will approve a one week supply and I expect to see him in the [place] clinic on Tuesday.

## 2018-07-13 NOTE — Telephone Encounter (Signed)
LM on patient's VM Rx sent to [place] and appt reminder for 07/18/2018. Hubbard Hartshorn, RN, BSN

## 2018-07-13 NOTE — Telephone Encounter (Signed)
Patient called in asking about refill on suboxone. States he has been out x 1 week but has not been abusing any drugs. States, "I've kept my nose clean." States he was not aware of OUD appt on 07/11/2018. Also, has new phone number which has been added to patient's record. OUD appt made for 07/18/2018 at 1115. Patient understands the importance of coming to this appt and states he will be there. Requesting refill on suboxone sent to [facility]. Hubbard Hartshorn, RN, BSN

## 2018-07-13 NOTE — Telephone Encounter (Signed)
Rx sent to [facility].

## 2018-07-18 ENCOUNTER — Encounter (HOSPITAL_COMMUNITY): Payer: Self-pay | Admitting: Emergency Medicine

## 2018-07-18 ENCOUNTER — Other Ambulatory Visit: Payer: Self-pay

## 2018-07-18 ENCOUNTER — Emergency Department (HOSPITAL_COMMUNITY)
Admission: EM | Admit: 2018-07-18 | Discharge: 2018-07-18 | Disposition: A | Discharge status: Home / Self Care | Payer: Self-pay | Attending: Emergency Medicine | Admitting: Emergency Medicine

## 2018-07-18 ENCOUNTER — Telehealth: Payer: Self-pay | Admitting: *Deleted

## 2018-07-18 ENCOUNTER — Encounter: Payer: Self-pay | Admitting: Student in an Organized Health Care Education/Training Program

## 2018-07-18 DIAGNOSIS — E119 Type 2 diabetes mellitus without complications: Secondary | ICD-10-CM | POA: Insufficient documentation

## 2018-07-18 DIAGNOSIS — Z03818 Encounter for observation for suspected exposure to other biological agents ruled out: Secondary | ICD-10-CM | POA: Insufficient documentation

## 2018-07-18 DIAGNOSIS — F17210 Nicotine dependence, cigarettes, uncomplicated: Secondary | ICD-10-CM | POA: Insufficient documentation

## 2018-07-18 DIAGNOSIS — I10 Essential (primary) hypertension: Secondary | ICD-10-CM | POA: Insufficient documentation

## 2018-07-18 DIAGNOSIS — F909 Attention-deficit hyperactivity disorder, unspecified type: Secondary | ICD-10-CM | POA: Insufficient documentation

## 2018-07-18 DIAGNOSIS — N200 Calculus of kidney: Secondary | ICD-10-CM | POA: Insufficient documentation

## 2018-07-18 DIAGNOSIS — J449 Chronic obstructive pulmonary disease, unspecified: Secondary | ICD-10-CM | POA: Insufficient documentation

## 2018-07-18 LAB — BASIC METABOLIC PANEL
Anion gap: 7 (ref 5–15)
BUN: 9 mg/dL (ref 6–20)
CO2: 27 mmol/L (ref 22–32)
Calcium: 9.1 mg/dL (ref 8.9–10.3)
Chloride: 106 mmol/L (ref 98–111)
Creatinine, Ser: 0.83 mg/dL (ref 0.61–1.24)
GFR calc Af Amer: >60 mL/min (ref >60)
GFR calc non Af Amer: >60 mL/min (ref >60)
Glucose, Bld: 101 mg/dL — ABNORMAL HIGH (ref 70–99)
Potassium: 3.5 mmol/L (ref 3.5–5.1)
Sodium: 140 mmol/L (ref 135–145)

## 2018-07-18 LAB — CBC
HCT: 45.4 % (ref 39.0–52.0)
Hemoglobin: 14.3 g/dL (ref 13.0–17.0)
MCH: 30.8 pg (ref 26.0–34.0)
MCHC: 31.5 g/dL (ref 30.0–36.0)
MCV: 97.6 fL (ref 80.0–100.0)
Platelets: 151 10*3/uL (ref 150–400)
RBC: 4.65 MIL/uL (ref 4.22–5.81)
RDW: 13.9 % (ref 11.5–15.5)
WBC: 5.5 10*3/uL (ref 4.0–10.5)
nRBC: 0 % (ref 0.0–0.2)

## 2018-07-18 LAB — SARS CORONAVIRUS 2 BY RT PCR (HOSPITAL ORDER, PERFORMED IN ~~LOC~~ HOSPITAL LAB): SARS Coronavirus 2: NEGATIVE

## 2018-07-18 MED ORDER — HYDROMORPHONE HCL 1 MG/ML IJ SOLN
1.0000 mg | Freq: Once | INTRAMUSCULAR | Status: AC
  Administered 2018-07-18: 1 mg via INTRAVENOUS
  Filled 2018-07-18: qty 1

## 2018-07-18 NOTE — ED Triage Notes (Signed)
Pain in lower abd, dx with kidney stone x 3 days ago

## 2018-07-18 NOTE — Discharge Instructions (Addendum)
You were evaluated in the Emergency Department and after careful evaluation, we did not find any emergent condition requiring admission or further testing in the hospital.  Your symptoms today seem to be due to a kidney stone lodged in the urethra.  Your labs today are reassuring and therefore this condition does not need an emergent procedure.  It does need a procedure soon as an outpatient.  Please call the urology office provided to schedule an appointment.  Please return to the Emergency Department if you experience any worsening of your condition.  We encourage you to follow up with a primary care provider.  Thank you for allowing [place] to be a part of your care.

## 2018-07-18 NOTE — Telephone Encounter (Signed)
Pt called to explain why he has missed his most recent appt,, it was explained to him that it is the eval of the physicians that  he needs a more intense rehab program at this time. He was given all the resources noted in dr vincent's letter and made aware that he would be receiving said letter in mail. He was encouraged to make contact with these other facilities and wished the best of luck. Call was witnessed by [facility] cma.

## 2018-07-18 NOTE — ED Provider Notes (Signed)
[facility] Emergency Department Provider Note MRN:  [id number]  Arrival date & time: 07/18/18     Chief Complaint   Flank Pain   History of Present Illness   Joshua Thomas is a 43 y.o. year-old male with a history of chronic back pain, COPD, hep C, bipolar disorder, kidney stones, hypospadias presenting to the ED with chief complaint of flank pain.  Patient explains that he was diagnosed with another kidney stone at [facility] emergency department a few days ago.  He explains that he had initially presented there with flank pain, but the pain has transitioned to his penis.  He explains that he has a large stone stuck inside his penis and he cannot get it out.  He has been restricting his fluid intake because of the fear of the pressure and the pain.  Since the stone transitioned to his penis yesterday, he has only been able to provide small spurts of urine.  He denies fever, no chest pain or shortness of breath.  Endorsing abdominal fullness.  Endorsing severe 10 out of 10 pain in the penis.  Constant, no exacerbating or relieving factors.  Review of Systems  A complete 10 system review of systems was obtained and all systems are negative except as noted in the HPI and PMH.   Patient's Health History    Past Medical History:  Diagnosis Date  . ADHD (attention deficit hyperactivity disorder)   . Bipolar 1 disorder (HCC)   . Chronic back pain   . Chronic pain    chronic l leg pain  . 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 COPD (chronic obstructive pulmonary disease) (HCC)   . DDD (degenerative disc disease), lumbar   . Degenerative disc disease, lumbar   . Depression   . Discitis   . GERD (gastroesophageal reflux disease)   . Hepatitis C   . Hepatitis C   . History of stomach ulcers   . Hx of diabetes mellitus    due to infection  . Hypertension   . Lung nodules    3 on L and 2 on R  . PTSD (post-traumatic stress disorder)   .  Sciatica   . Substance abuse [facility](HCC)     Past Surgical History:  Procedure Laterality Date  . FRACTURE SURGERY    . HARDWARE REMOVAL  01/27/2012   Procedure: HARDWARE REMOVAL;  Surgeon: Kathryne Hitchhristopher Y Blackman, MD;  Location: [facility];  Service: Orthopedics;  Laterality: Left;  . IR LUMBAR DISC ASPIRATION W/IMG GUIDE  06/08/2017  . RADIOLOGY WITH ANESTHESIA N/A 06/08/2017   Procedure: DISC ASPIRATION;  Surgeon: Julieanne Cottoneveshwar, Sanjeev, MD;  Location: [facility] OR;  Service: Radiology;  Laterality: N/A;  . TIBIA IM NAIL INSERTION  01/27/2012   Procedure: INTRAMEDULLARY (IM) NAIL TIBIAL;  Surgeon: Kathryne Hitchhristopher Y Blackman, MD;  Location: [facility];  Service: Orthopedics;  Laterality: Left;  Removal of IM Rod and Screws Left Tibia with Exchange Nail, Allograft Bone Graft Left Tibia    Family History  Problem Relation Age of Onset  . Diabetes Mother     Social History   Socioeconomic History  . Marital status: Single    Spouse name: Not on file  . Number of children: Not on file  . Years of education: Not on file  . Highest education level: Not on file  Occupational History  . Not on file  Social Needs  . Financial resource strain: Very hard  . Food insecurity  Worry: Sometimes true    Inability: Sometimes true  . Transportation needs    Medical: Yes    Non-medical: Yes  Tobacco Use  . Smoking status: Current Every Day Smoker    Packs/day: 1.00    Years: 33.00    Pack years: 33.00    Types: E-cigarettes, Cigarettes  . Smokeless tobacco: Former Systems developer    Quit date: 08/08/2006  . Tobacco comment: 1 PPD  Substance and Sexual Activity  . Alcohol use: Yes    Comment: occasionally  . Drug use: Yes    Types: Cocaine, IV    Comment: Pt states he "shoots whatever is available".  last used 1 week ago   . Sexual activity: Not on file  Lifestyle  . Physical activity    Days per week: Patient refused    Minutes per session: Patient refused  . Stress: Very much  Relationships  . Social Product manager on phone: Once a week    Gets together: Once a week    Attends religious service: Never    Active member of club or organization: No    Attends meetings of clubs or organizations: Never    Relationship status: Never married  . Intimate partner violence    Fear of current or ex partner: Patient refused    Emotionally abused: Patient refused    Physically abused: Patient refused    Forced sexual activity: Patient refused  Other Topics Concern  . Not on file  Social History Narrative  . Not on file     Physical Exam  Vital Signs and Nursing Notes reviewed Vitals:   07/18/18 1709  BP: (!) 139/100  Pulse: 80  Resp: 15  Temp: 98 F (36.7 C)  SpO2: 100%    CONSTITUTIONAL: Well-appearing, NAD NEURO:  Alert and oriented x 3, no focal deficits EYES:  eyes equal and reactive ENT/NECK:  no LAD, no JVD CARDIO: Regular rate, well-perfused, normal S1 and S2 PULM:  CTAB no wheezing or rhonchi GI/GU:  normal bowel sounds, non-distended, non-tender; estimated 1 cm firm stone within the mid penile urethra, hypospadias is noted MSK/SPINE:  No gross deformities, no edema SKIN:  no rash, atraumatic PSYCH:  Appropriate speech and behavior  Diagnostic and Interventional Summary    Labs Reviewed  BASIC METABOLIC PANEL - Abnormal; Notable for the following components:      Result Value   Glucose, Bld 101 (*)    All other components within normal limits  SARS CORONAVIRUS 2 (HOSPITAL ORDER, [place] [facility])  CBC    No orders to display    Medications  HYDROmorphone (DILAUDID) injection 1 mg (1 mg Intravenous Given 07/18/18 2027)     Procedures Critical Care  ED Course and Medical Decision Making  I have reviewed the triage vital signs and the nursing notes.  Pertinent labs & imaging results that were available during my care of the patient were reviewed by me and considered in my medical decision making (see below for details).  Concern for acute urinary  retention related to retained penile kidney stone.  Patient has a history of stones, history of large calcified stones, will obtain bladder scan, kidney function testing, provide pain control, likely discussed with urology.  Discussed case with urology.  Patient does have 400 cc of urine in the bladder, however locally labs are reassuring with no AKI.  Patient has been urinating in spurts.  Dr. Tammi Klippel of urology explains that in the absence  of AKI, there is no need for emergent intervention and this can be managed as an outpatient.  Patient is agreeable with this plan, will call urology first thing in the morning.  After the discussed management above, the patient was determined to be safe for discharge.  The patient was in agreement with this plan and all questions regarding their care were answered.  ED return precautions were discussed and the patient will return to the ED with any significant worsening of condition.  Elmer SowMichael M. Pilar PlateBero, MD [facility] [email or web address]  Final Clinical Impressions(s) / ED Diagnoses     ICD-10-CM   1. Kidney stone  N20.0     ED Discharge Orders    None         Sabas SousBero, Michael M, MD 07/18/18 2152

## 2018-07-25 ENCOUNTER — Ambulatory Visit: Payer: Self-pay | Admitting: Urology

## 2018-10-22 ENCOUNTER — Encounter (HOSPITAL_COMMUNITY): Payer: Self-pay | Admitting: Emergency Medicine

## 2018-10-22 ENCOUNTER — Emergency Department (HOSPITAL_COMMUNITY): Payer: Self-pay

## 2018-10-22 ENCOUNTER — Other Ambulatory Visit: Payer: Self-pay

## 2018-10-22 ENCOUNTER — Emergency Department (HOSPITAL_COMMUNITY)
Admission: EM | Admit: 2018-10-22 | Discharge: 2018-10-22 | Disposition: A | Discharge status: Home / Self Care | Payer: Self-pay | Attending: Emergency Medicine | Admitting: Emergency Medicine

## 2018-10-22 DIAGNOSIS — Z79899 Other long term (current) drug therapy: Secondary | ICD-10-CM | POA: Insufficient documentation

## 2018-10-22 DIAGNOSIS — Y9389 Activity, other specified: Secondary | ICD-10-CM | POA: Insufficient documentation

## 2018-10-22 DIAGNOSIS — I10 Essential (primary) hypertension: Secondary | ICD-10-CM | POA: Insufficient documentation

## 2018-10-22 DIAGNOSIS — M79661 Pain in right lower leg: Secondary | ICD-10-CM | POA: Insufficient documentation

## 2018-10-22 DIAGNOSIS — W132XXA Fall from, out of or through roof, initial encounter: Secondary | ICD-10-CM | POA: Insufficient documentation

## 2018-10-22 DIAGNOSIS — M79604 Pain in right leg: Secondary | ICD-10-CM

## 2018-10-22 DIAGNOSIS — J449 Chronic obstructive pulmonary disease, unspecified: Secondary | ICD-10-CM | POA: Insufficient documentation

## 2018-10-22 DIAGNOSIS — W19XXXA Unspecified fall, initial encounter: Secondary | ICD-10-CM

## 2018-10-22 DIAGNOSIS — Y9289 Other specified places as the place of occurrence of the external cause: Secondary | ICD-10-CM | POA: Insufficient documentation

## 2018-10-22 DIAGNOSIS — Y990 Civilian activity done for income or pay: Secondary | ICD-10-CM | POA: Insufficient documentation

## 2018-10-22 DIAGNOSIS — F17210 Nicotine dependence, cigarettes, uncomplicated: Secondary | ICD-10-CM | POA: Insufficient documentation

## 2018-10-22 MED ORDER — HYDROCODONE-ACETAMINOPHEN 5-325 MG PO TABS: 1.0000 | ORAL_TABLET | Freq: Three times a day (TID) | ORAL | 0 refills | Status: DC | PRN

## 2018-10-22 MED ORDER — HYDROCODONE-ACETAMINOPHEN 5-325 MG PO TABS
1.0000 | ORAL_TABLET | Freq: Once | ORAL | Status: AC
  Administered 2018-10-22: 1 via ORAL
  Filled 2018-10-22: qty 1

## 2018-10-22 NOTE — ED Provider Notes (Signed)
[facility]ERGENCY DEPARTMENT Provider Note   CSN: [id number] Arrival date & time: 10/22/18  1855     History   Chief Complaint Chief Complaint  Patient presents with  . Leg Pain    HPI Joshua Thomas is a 43 y.o. male.     HPI Patient presents after a fall that occurred yesterday.  Patient was working in an attic, Tree surgeoninstalling insulation, when he lost his balance and fell through the roof.  On he landed on a 2 x 4 on the floor beneath him.  1 he landed primarily on his right foot, felt sudden onset of pain in the ankle, calf. Pain is sharp, severe, worse with any motion or weightbearing, though he has been able to do so. He denies other complaints including loss of sensation in his leg, abdominal pain. He has not taken any medication for relief thus far, pain has become increasingly severe over the course of the day, and he presents for evaluation peer He acknowledges multiple medical issues, has some degree of chronic pain at baseline.   Past Medical History:  Diagnosis Date  . ADHD (attention deficit hyperactivity disorder)   . Bipolar 1 disorder (HCC)   . Chronic back pain   . Chronic pain    chronic l leg pain  . 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 COPD (chronic obstructive pulmonary disease) (HCC)   . DDD (degenerative disc disease), lumbar   . Degenerative disc disease, lumbar   . Depression   . Discitis   . GERD (gastroesophageal reflux disease)   . Hepatitis C   . Hepatitis C   . History of stomach ulcers   . Hx of diabetes mellitus    due to infection  . Hypertension   . Lung nodules    3 on L and 2 on R  . PTSD (post-traumatic stress disorder)   . Sciatica   . Substance abuse [facility](HCC)     Patient Active Problem List   Diagnosis Date Noted  . Chronic low back pain 09/23/2017  . Essential hypertension 09/23/2017  . Hyperglycemia 06/03/2017  . GERD (gastroesophageal reflux disease) 05/17/2017  . Bipolar disorder (HCC)  05/17/2017  . Opioid use disorder, moderate, dependence (HCC) 05/23/2015  . Chronic hepatitis C virus genotype 1a infection (HCC)     Past Surgical History:  Procedure Laterality Date  . FRACTURE SURGERY    . HARDWARE REMOVAL  01/27/2012   Procedure: HARDWARE REMOVAL;  Surgeon: Kathryne Hitchhristopher Y Blackman, MD;  Location: [facility] ORS;  Service: Orthopedics;  Laterality: Left;  . IR LUMBAR DISC ASPIRATION W/IMG GUIDE  06/08/2017  . RADIOLOGY WITH ANESTHESIA N/A 06/08/2017   Procedure: DISC ASPIRATION;  Surgeon: Julieanne Cottoneveshwar, Sanjeev, MD;  Location: [facility] OR;  Service: Radiology;  Laterality: N/A;  . TIBIA IM NAIL INSERTION  01/27/2012   Procedure: INTRAMEDULLARY (IM) NAIL TIBIAL;  Surgeon: Kathryne Hitchhristopher Y Blackman, MD;  Location: [facility] ORS;  Service: Orthopedics;  Laterality: Left;  Removal of IM Rod and Screws Left Tibia with Exchange Nail, Allograft Bone Graft Left Tibia        Home Medications    Prior to Admission medications   Medication Sig Start Date End Date Taking? Authorizing Provider  buprenorphine-naloxone (SUBOXONE) 8-2 mg SUBL SL tablet PLACE 1 TABLET UNDER THE TONGUE 2 (TWO) TIMES DAILY. 07/13/18   Tyson AliasVincent, Duncan Thomas, MD  cephALEXin (KEFLEX) 500 MG capsule Take 500 mg by mouth 4 (four) times daily. 7 day course starting on  07/16/2018 07/16/18   [provider]  cyclobenzaprine (FLEXERIL) 10 MG tablet Take 0.5 tablets (5 mg total) by mouth 3 (three) times daily as needed for muscle spasms. Patient taking differently: Take 10 mg by mouth 3 (three) times daily.  02/14/18   Lucious Groves, DO  DULoxetine (CYMBALTA) 30 MG capsule Please take 30mg  (1 tablet) daily for one week and then increase to 60mg  (2 tablets) daily. Patient taking differently: Take 30 mg by mouth daily.  02/14/18   Kathi Ludwig, MD  oxyCODONE (OXY IR/ROXICODONE) 5 MG immediate release tablet Take 5 mg by mouth every 4 (four) hours as needed for moderate pain.  07/16/18   [provider]  phenazopyridine (PYRIDIUM)  100 MG tablet Take 100 mg by mouth 2 (two) times daily. 07/16/18   [provider]  tamsulosin (FLOMAX) 0.4 MG CAPS capsule Take 0.4 mg by mouth daily. 07/16/18   [provider]    Family History Family History  Problem Relation Age of Onset  . Diabetes Mother     Social History Social History   Tobacco Use  . Smoking status: Current Every Day Smoker    Packs/day: 1.00    Years: 33.00    Pack years: 33.00    Types: E-cigarettes, Cigarettes  . Smokeless tobacco: Former Systems developer    Quit date: 08/08/2006  . Tobacco comment: 1 PPD  Substance Use Topics  . Alcohol use: Yes    Comment: occasionally  . Drug use: Yes    Types: Cocaine, IV    Comment: Pt states he "shoots whatever is available".  last used 1 week ago      Allergies   Patient has no known allergies.   Review of Systems Review of Systems  Constitutional:       Per HPI, otherwise negative  HENT:       Per HPI, otherwise negative  Respiratory:       Per HPI, otherwise negative  Cardiovascular:       Per HPI, otherwise negative  Gastrointestinal: Negative for vomiting.  Endocrine:       Negative aside from HPI  Genitourinary:       Neg aside from HPI   Musculoskeletal:       Per HPI, otherwise negative  Skin: Positive for color change.  Neurological: Negative for syncope and weakness.     Physical Exam Updated Vital Signs BP (!) 142/102 (BP Location: Right Arm)   Pulse 88   Temp (!) 97.4 F (36.3 C) (Oral)   Resp 16   Wt 68 kg   SpO2 100%   BMI 26.57 kg/m   Physical Exam Vitals signs and nursing note reviewed.  Constitutional:      General: He is not in acute distress.    Appearance: He is well-developed.     Comments: Thin, chronically ill appearing adult male awake and alert  HENT:     Head: Normocephalic and atraumatic.  Eyes:     Conjunctiva/sclera: Conjunctivae normal.  Cardiovascular:     Rate and Rhythm: Normal rate and regular rhythm.  Pulmonary:     Effort:  Pulmonary effort is normal. No respiratory distress.     Breath sounds: No stridor.  Abdominal:     General: There is no distension.  Musculoskeletal:     Right hip: Normal.     Left hip: Normal.     Left knee: Normal.       Legs:  Skin:    General: Skin is  warm and dry.  Neurological:     Mental Status: He is alert and oriented to person, place, and time.      ED Treatments / Results   Radiology Dg Tibia/fibula Right  Result Date: 10/22/2018 CLINICAL DATA:  Fall.  Pain. EXAM: RIGHT TIBIA AND FIBULA - 2 VIEW COMPARISON:  None. FINDINGS: There is no evidence of fracture or other focal bone lesions. Soft tissues are unremarkable. IMPRESSION: Negative. Electronically Signed   By: Elsie Stain M.D.   On: 10/22/2018 20:21    Procedures Procedures (including critical care time)  Medications Ordered in ED Medications  HYDROcodone-acetaminophen (NORCO/VICODIN) 5-325 MG per tablet 1 tablet (1 tablet Oral Given 10/22/18 2254)     Initial Impression / Assessment and Plan / ED Course  I have reviewed the triage vital signs and the nursing notes.  Pertinent labs & imaging results that were available during my care of the patient were reviewed by me and considered in my medical decision making (see chart for details).    Update: With concern for occult fracture the patient will have CT of the knee.    11:25 PM Patient in no distress, awake, alert. I have reviewed the CT images for x-ray, no evidence for fracture, there is a proximal hematoma in the lower leg, likely contributing to substantial pain in that area. He is distally neurovascularly intact Patient will have Ace wrap applied, received crutches, analgesic and will follow up with orthopedics as needed.  Final Clinical Impressions(s) / ED Diagnoses   Final diagnoses:  Fall, initial encounter  Right leg pain    ED Discharge Orders         Ordered    HYDROcodone-acetaminophen (NORCO/VICODIN) 5-325 MG tablet  3 times  daily PRN     10/22/18 2328           Gerhard Munch, MD 10/22/18 2329

## 2018-10-22 NOTE — ED Notes (Signed)
Pt ambulatory in lobby. Pt rearranging chairs in the lobby.

## 2018-10-22 NOTE — Discharge Instructions (Signed)
As discussed, today's evaluation has been generally reassuring.  However, there is evidence for both a hematoma and a sprain in your knee and ankle effectively. Please use the provided crutches, and wraps for comfort peer In addition to the prescribed medication, please use ibuprofen, 400 mg, 3 times daily for the next 4 days. Return here for concerning changes in your condition.

## 2018-10-22 NOTE — ED Triage Notes (Signed)
Pt C/O right  lower leg pain and bottom of right foot. Pt reports falling 68ft on "some 2x4s."

## 2019-04-26 ENCOUNTER — Other Ambulatory Visit: Payer: Self-pay

## 2019-04-26 ENCOUNTER — Emergency Department (HOSPITAL_COMMUNITY)
Admission: EM | Admit: 2019-04-26 | Discharge: 2019-04-26 | Disposition: A | Discharge status: Home / Self Care | Payer: Self-pay | Attending: Emergency Medicine | Admitting: Emergency Medicine

## 2019-04-26 ENCOUNTER — Encounter (HOSPITAL_COMMUNITY): Payer: Self-pay | Admitting: Emergency Medicine

## 2019-04-26 DIAGNOSIS — F1510 Other stimulant abuse, uncomplicated: Secondary | ICD-10-CM | POA: Insufficient documentation

## 2019-04-26 DIAGNOSIS — I10 Essential (primary) hypertension: Secondary | ICD-10-CM | POA: Insufficient documentation

## 2019-04-26 DIAGNOSIS — F329 Major depressive disorder, single episode, unspecified: Secondary | ICD-10-CM | POA: Insufficient documentation

## 2019-04-26 DIAGNOSIS — F17210 Nicotine dependence, cigarettes, uncomplicated: Secondary | ICD-10-CM | POA: Insufficient documentation

## 2019-04-26 DIAGNOSIS — F1994 Other psychoactive substance use, unspecified with psychoactive substance-induced mood disorder: Secondary | ICD-10-CM | POA: Insufficient documentation

## 2019-04-26 DIAGNOSIS — Z79899 Other long term (current) drug therapy: Secondary | ICD-10-CM | POA: Insufficient documentation

## 2019-04-26 DIAGNOSIS — E119 Type 2 diabetes mellitus without complications: Secondary | ICD-10-CM | POA: Insufficient documentation

## 2019-04-26 LAB — URINALYSIS, ROUTINE W REFLEX MICROSCOPIC
Bilirubin Urine: NEGATIVE
Glucose, UA: NEGATIVE mg/dL
Hgb urine dipstick: NEGATIVE
Ketones, ur: NEGATIVE mg/dL
Leukocytes,Ua: NEGATIVE
Nitrite: NEGATIVE
Protein, ur: NEGATIVE mg/dL
Specific Gravity, Urine: 1.01 (ref 1.005–1.030)
pH: 7 (ref 5.0–8.0)

## 2019-04-26 LAB — ACETAMINOPHEN LEVEL: Acetaminophen (Tylenol), Serum: <10 ug/mL — ABNORMAL LOW (ref 10–30)

## 2019-04-26 LAB — RAPID URINE DRUG SCREEN, HOSP PERFORMED
Amphetamines: POSITIVE — AB
Barbiturates: NOT DETECTED
Benzodiazepines: POSITIVE — AB
Cocaine: NOT DETECTED
Opiates: NOT DETECTED
Tetrahydrocannabinol: NOT DETECTED

## 2019-04-26 LAB — COMPREHENSIVE METABOLIC PANEL
ALT: 82 U/L — ABNORMAL HIGH (ref 0–44)
AST: 39 U/L (ref 15–41)
Albumin: 3.5 g/dL (ref 3.5–5.0)
Alkaline Phosphatase: 48 U/L (ref 38–126)
Anion gap: 7 (ref 5–15)
BUN: 18 mg/dL (ref 6–20)
CO2: 25 mmol/L (ref 22–32)
Calcium: 9 mg/dL (ref 8.9–10.3)
Chloride: 103 mmol/L (ref 98–111)
Creatinine, Ser: 1.09 mg/dL (ref 0.61–1.24)
GFR calc Af Amer: >60 mL/min (ref >60)
GFR calc non Af Amer: >60 mL/min (ref >60)
Glucose, Bld: 176 mg/dL — ABNORMAL HIGH (ref 70–99)
Potassium: 4.1 mmol/L (ref 3.5–5.1)
Sodium: 135 mmol/L (ref 135–145)
Total Bilirubin: 0.4 mg/dL (ref 0.3–1.2)
Total Protein: 6.9 g/dL (ref 6.5–8.1)

## 2019-04-26 LAB — CBC WITH DIFFERENTIAL/PLATELET
Abs Immature Granulocytes: 0.01 10*3/uL (ref 0.00–0.07)
Basophils Absolute: 0 10*3/uL (ref 0.0–0.1)
Basophils Relative: 0 %
Eosinophils Absolute: 0.3 10*3/uL (ref 0.0–0.5)
Eosinophils Relative: 6 %
HCT: 41 % (ref 39.0–52.0)
Hemoglobin: 13 g/dL (ref 13.0–17.0)
Immature Granulocytes: 0 %
Lymphocytes Relative: 42 %
Lymphs Abs: 2.5 10*3/uL (ref 0.7–4.0)
MCH: 30.5 pg (ref 26.0–34.0)
MCHC: 31.7 g/dL (ref 30.0–36.0)
MCV: 96.2 fL (ref 80.0–100.0)
Monocytes Absolute: 0.4 10*3/uL (ref 0.1–1.0)
Monocytes Relative: 7 %
Neutro Abs: 2.7 10*3/uL (ref 1.7–7.7)
Neutrophils Relative %: 45 %
Platelets: 158 10*3/uL (ref 150–400)
RBC: 4.26 MIL/uL (ref 4.22–5.81)
RDW: 13.6 % (ref 11.5–15.5)
WBC: 6 10*3/uL (ref 4.0–10.5)
nRBC: 0 % (ref 0.0–0.2)

## 2019-04-26 LAB — SALICYLATE LEVEL: Salicylate Lvl: <7 mg/dL — ABNORMAL LOW (ref 7.0–30.0)

## 2019-04-26 LAB — ETHANOL: Alcohol, Ethyl (B): <10 mg/dL (ref <10)

## 2019-04-26 MED ORDER — HYDROXYZINE HCL 25 MG PO TABS
50.0000 mg | ORAL_TABLET | Freq: Four times a day (QID) | ORAL | Status: DC | PRN
  Administered 2019-04-26: 50 mg via ORAL
  Filled 2019-04-26: qty 2

## 2019-04-26 NOTE — ED Notes (Signed)
TTS in progress at this time in private family room and sitter present with patient.

## 2019-04-26 NOTE — ED Triage Notes (Signed)
Pt brought in by [employer] under IVC which was taken out by his mother because of pt's substance abuse problem. Pt denies SI or HI.

## 2019-04-26 NOTE — BH Assessment (Addendum)
Tele Assessment Note   Patient Name: Joshua Thomas MRN: [id number] Referring Physician: Devoria Albe, MD Location of Patient: [facility] Location of Provider: Behavioral Health TTS Department  Joshua Thomas is an 44 y.o. male who presents to the ED under IVC initiated by his mother. Per IVC, respondent "has a history of substance abuse. Uses both recreation and prescriptions drugs. Subject leaves house for days at a time without knowing whereabouts. Subject stays up for days and then sleeps for says. Subject could possible be on meth as well as taken prescriptions drugs he has found in his mothers home. Subject is having trouble eating. Subject is also talking out of his head, he references suicide. Mother is fearful for safety of son and those whom he might come into contact with."  TTS attempted to contact the petitioner of the IVC, [phone] in order to obtain collateral information but did not receive an answer. No v/m setup for TTS to leave a message.   TTS spoke with the pt who is pleasant and cooperative. Pt is laughing and making jokes during the assessment. Pt states he feels angry that his mother took things he said literally. Pt states he was speaking with his mother and she told him that if he did not make changes, he was going to die. Pt then responded to his mother saying "would that be such a bad thing?" Pt denies SI and denies that he has a plan. Pt reports he has had thoughts of suicide in the past but never a plan. Pt endorses HI in the past when people sell him bad drugs. Pt denies HI at present. Pt denies AVH. Pt states he has used "everything under the sun" as it relates to drug use. Pt states he is not working and is not receiving disability and he has not applied for it yet. Pt states he wants to apply for disability but he has prolonged it. Pt states his mother calls the police about any and everything because she takes things too literal. Pt is speaking rapidly and states "I just need  someone to talk to."  Pt is alert and oriented during the assessment. Pt responds appropriately. Pt judgement is partial and affect is euthymic. Pt is laughing during the assessment and tells this writer "you have beautiful eyes and that bun on your head is so pretty." Pt states he wants to be d/c so that he can go back home and get back in the bed.    Renaye Rakers, NP recommends continued observation for safety and stabilization and to be reassessed in the AM by psych. EDP Devoria Albe, MD and Jola Schmidt, RN have been advised.   Diagnosis: Stimulant use d/o, severe Substance induced mood d/o  Past Medical History:  Past Medical History:  Diagnosis Date  . ADHD (attention deficit hyperactivity disorder)   . Bipolar 1 disorder (HCC)   . Chronic back pain   . Chronic pain    chronic l leg pain  . 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 COPD (chronic obstructive pulmonary disease) (HCC)   . DDD (degenerative disc disease), lumbar   . Degenerative disc disease, lumbar   . Depression   . Discitis   . GERD (gastroesophageal reflux disease)   . Hepatitis C   . Hepatitis C   . History of stomach ulcers   . Hx of diabetes mellitus    due to infection  . Hypertension   . Lung  nodules    3 on L and 2 on R  . PTSD (post-traumatic stress disorder)   . Sciatica   . Substance abuse [facility])     Past Surgical History:  Procedure Laterality Date  . FRACTURE SURGERY    . HARDWARE REMOVAL  01/27/2012   Procedure: HARDWARE REMOVAL;  Surgeon: Kathryne Hitch, MD;  Location: [facility];  Service: Orthopedics;  Laterality: Left;  . IR LUMBAR DISC ASPIRATION W/IMG GUIDE  06/08/2017  . RADIOLOGY WITH ANESTHESIA N/A 06/08/2017   Procedure: DISC ASPIRATION;  Surgeon: Julieanne Cotton, MD;  Location: [facility] OR;  Service: Radiology;  Laterality: N/A;  . TIBIA IM NAIL INSERTION  01/27/2012   Procedure: INTRAMEDULLARY (IM) NAIL TIBIAL;  Surgeon: Kathryne Hitch, MD;   Location: [facility];  Service: Orthopedics;  Laterality: Left;  Removal of IM Rod and Screws Left Tibia with Exchange Nail, Allograft Bone Graft Left Tibia    Family History:  Family History  Problem Relation Age of Onset  . Diabetes Mother     Social History:  reports that he has been smoking e-cigarettes and cigarettes. He has a 33.00 pack-year smoking history. He quit smokeless tobacco use about 12 years ago. He reports current alcohol use. He reports current drug use. Drugs: Cocaine and IV.  Additional Social History:  Alcohol / Drug Use Pain Medications: See MAR Prescriptions: See MAR Over the Counter: See MAR History of alcohol / drug use?: Yes("I've been on everything") Substance #1 Name of Substance 1: Meth 1 - Age of First Use: unk 1 - Amount (size/oz): excessive 1 - Frequency: daily 1 - Duration: ongoing 1 - Last Use / Amount: 04/25/18 Substance #2 Name of Substance 2: Benzo 2 - Age of First Use: unk 2 - Amount (size/oz): unk 2 - Frequency: unk 2 - Duration: unk 2 - Last Use / Amount: unk Substance #3 Name of Substance 3: Alcohol 3 - Age of First Use: unk 3 - Amount (size/oz): unk 3 - Frequency: unk 3 - Duration: unk 3 - Last Use / Amount: unk  CIWA: CIWA-Ar BP: (!) 143/98 Pulse Rate: 94 COWS:    Allergies: No Known Allergies  Home Medications: (Not in a hospital admission)   OB/GYN Status:  No LMP for male patient.  General Assessment Data Location of Assessment: [facility] TTS Assessment: In system Is this a Tele or Face-to-Face Assessment?: Tele Assessment Is this an Initial Assessment or a Re-assessment for this encounter?: Initial Assessment Patient Accompanied by:: N/A Language Other than English: No Living Arrangements: Other (Comment) What gender do you identify as?: Male Marital status: Single Pregnancy Status: No Living Arrangements: Parent Can pt return to current living arrangement?: Yes Admission Status: Involuntary Petitioner: Family  member Is patient capable of signing voluntary admission?: No Referral Source: Self/Family/Friend Insurance type: none     Crisis Care Plan Living Arrangements: Parent Name of Psychiatrist: none Name of Therapist: none  Education Status Is patient currently in school?: No Is the patient employed, unemployed or receiving disability?: Unemployed  Risk to self with the past 6 months Suicidal Ideation: No-Not Currently/Within Last 6 Months Has patient been a risk to self within the past 6 months prior to admission? : No Suicidal Intent: No Has patient had any suicidal intent within the past 6 months prior to admission? : No Is patient at risk for suicide?: No Suicidal Plan?: No Has patient had any suicidal plan within the past 6 months prior to admission? : No Access to Means: No  What has been your use of drugs/alcohol within the last 12 months?: meth, benzos Previous Attempts/Gestures: No Other Self Harm Risks: hx of substance abuse Triggers for Past Attempts: None known Intentional Self Injurious Behavior: None Family Suicide History: No Recent stressful life event(s): Other (Comment), Financial Problems, Job Loss, Recent negative physical changes(substance abuse) Persecutory voices/beliefs?: No Depression: Yes Depression Symptoms: Despondent, Feeling worthless/self pity, Loss of interest in usual pleasures Substance abuse history and/or treatment for substance abuse?: No Suicide prevention information given to non-admitted patients: Not applicable  Risk to Others within the past 6 months Homicidal Ideation: No-Not Currently/Within Last 6 Months Does patient have any lifetime risk of violence toward others beyond the six months prior to admission? : Yes (comment)(past thoughts of wanting to hurt others) Thoughts of Harm to Others: No-Not Currently Present/Within Last 6 Months Current Homicidal Intent: No Current Homicidal Plan: No Access to Homicidal Means: No History of  harm to others?: No Assessment of Violence: None Noted Does patient have access to weapons?: No Criminal Charges Pending?: No Does patient have a court date: No Is patient on probation?: No  Psychosis Hallucinations: None noted Delusions: None noted  Mental Status Report Appearance/Hygiene: Unremarkable Eye Contact: Good Motor Activity: Freedom of movement Speech: Tangential, Rapid Level of Consciousness: Alert Mood: Euthymic Affect: Silly Anxiety Level: None Thought Processes: Relevant, Coherent Judgement: Partial Orientation: Person, Place, Time, Situation, Appropriate for developmental age Obsessive Compulsive Thoughts/Behaviors: None  Cognitive Functioning Concentration: Normal Memory: Remote Intact, Recent Intact Is patient IDD: No Insight: Fair Impulse Control: Poor Appetite: Good Have you had any weight changes? : No Change Sleep: Decreased Total Hours of Sleep: 6 Vegetative Symptoms: None  ADLScreening [facility] Assessment Services) Patient's cognitive ability adequate to safely complete daily activities?: Yes Patient able to express need for assistance with ADLs?: Yes Independently performs ADLs?: Yes (appropriate for developmental age)  Prior Inpatient Therapy Prior Inpatient Therapy: Yes Prior Therapy Dates: 2012, 2017 Prior Therapy Facilty/Provider(s): [facility] Reason for Treatment: Substance or medication-induced bipolar and related disorder with onset during intoxication [facility])  Prior Outpatient Therapy Prior Outpatient Therapy: Yes Prior Therapy Dates: 2020 Prior Therapy Facilty/Provider(s): [facility] Reason for Treatment: Opioid use disorder, moderate, dependence (HCC) Does patient have an ACCT team?: No Does patient have Intensive In-House Services?  : No Does patient have [facility] services? : No Does patient have P4CC services?: No  ADL Screening (condition at time of admission) Patient's cognitive ability adequate to safely  complete daily activities?: Yes Is the patient deaf or have difficulty hearing?: No Does the patient have difficulty seeing, even when wearing glasses/contacts?: No Does the patient have difficulty concentrating, remembering, or making decisions?: No Patient able to express need for assistance with ADLs?: Yes Does the patient have difficulty dressing or bathing?: No Independently performs ADLs?: Yes (appropriate for developmental age) Does the patient have difficulty walking or climbing stairs?: No Weakness of Legs: None Weakness of Arms/Hands: None  Home Assistive Devices/Equipment Home Assistive Devices/Equipment: None    Abuse/Neglect Assessment (Assessment to be complete while patient is alone) Abuse/Neglect Assessment Can Be Completed: Yes Physical Abuse: Denies Verbal Abuse: Denies Sexual Abuse: Denies Exploitation of patient/patient's resources: Denies Self-Neglect: Denies     Merchant navy officer (For Healthcare) Does Patient Have a Medical Advance Directive?: No Would patient like information on creating a medical advance directive?: No - Patient declined          Disposition:  Adaku Anike, NP recommends continued observation for safety and stabilization and to  be reassessed in the AM by psych. EDP [name], MD and Verdie Mosher, RN have been advised.  Disposition Initial Assessment Completed for this Encounter: Yes Disposition of Patient: (overnight OBS) Patient refused recommended treatment: No  This service was provided via telemedicine using a 2-way, interactive audio and video technology.  Names of all persons participating in this telemedicine service and their role in this encounter. Name: Webb Laws  Role: Patient  Name: Lind Covert, LCSW Role: TTS  Name: Talbot Grumbling, NP Role: [facility] provider    Lyanne Co 04/26/2019 6:53 AM

## 2019-04-26 NOTE — Progress Notes (Signed)
Patient ID: Joshua Thomas, male   DOB: [birth year], 44 y.o.   MRN: [id number]   Psychiatric reassessment   HPI: Joshua Thomas is an 44 y.o. male who presents to the ED under IVC initiated by his mother. Per IVC, respondent "has a history of substance abuse. Uses both recreation and prescriptions drugs. Subject leaves house for days at a time without knowing whereabouts. Subject stays up for days and then sleeps for says. Subject could possible be on meth as well as taken prescriptions drugs he has found in his mothers home. Subject is having trouble eating. Subject is also talking out of his head, he references suicide. Mother is fearful for safety of son and those whom he might come into contact with."  TTS attempted to contact the petitioner of the IVC, [phone] in order to obtain collateral information but did not receive an answer. No v/m setup for TTS to leave a message.   TTS spoke with the pt who is pleasant and cooperative. Pt is laughing and making jokes during the assessment. Pt states he feels angry that his mother took things he said literally. Pt states he was speaking with his mother and she told him that if he did not make changes, he was going to die. Pt then responded to his mother saying "would that be such a bad thing?" Pt denies SI and denies that he has a plan. Pt reports he has had thoughts of suicide in the past but never a plan. Pt endorses HI in the past when people sell him bad drugs. Pt denies HI at present. Pt denies AVH. Pt states he has used "everything under the sun" as it relates to drug use. Pt states he is not working and is not receiving disability and he has not applied for it yet. Pt states he wants to apply for disability but he has prolonged it. Pt states his mother calls the police about any and everything because she takes things too literal. Pt is speaking rapidly and states "I just need someone to talk to."  Psychiatry evaluation: This is a 44 year old male who  presented to [facility], under IVC, for concerns as noted above. During this evaluation, he is alert an oriented x4, calm and cooperative. He stated that he was taken to the hospital because his mother IVC'd him. Stated that his mother has been concerned about his health as he has a diagnosis of Hep C and she has been concerned about his drug use. Per chart review, he has a history of polysubstance abuse.  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 Stated that he had not been taking care of his health as he should. He denied current SI although stated," off the record, I have had the thoughts in the past due to my drug use but I could never bring myself to do it. I have never had a plan or intent." He denied prior suicide attempts. He denied current AVH or paranoia although stated symptoms do occur when he is using meth or coming off. He denied access to firearms. Reported prior inpatient psychiatric hospitalizations in the distant past. Reported going to [facility] in the distant past but no current outpatient psychiatric resources. Reported he was once living in the [facility] in [place] for substance abuse treatment and stated he  would like to go back there for services.    Disposition: Patient denies SI, HI or psychosis. He does not appear internally preoccupied. At this time, there is no evidence of imminent risk to self or others at present.Patient does not meet criteria for psychiatric inpatient admission an di therefore psychiatrically cleared. .We discussed substance abuse treatment program along with resources for shelters and he stated that he preferred to go back to the [facility] in [place]. We will still provide additional resources.  He was encouraged to follow-up with [facility] for mental health needs and to follow-up with a primary care provider for  his Hep C.   ED updated on disposition

## 2019-04-26 NOTE — Progress Notes (Signed)
TTS spoke with Lauren who states she will take pt to assessment room.

## 2019-04-26 NOTE — ED Notes (Signed)
Faxed IVC Paperwork to [facility] 

## 2019-04-26 NOTE — Discharge Instructions (Addendum)
Substance Abuse Treatment Programs ° °Intensive Outpatient Programs °[facility]     °[address]      °[place], [place]                   °[phone]      ° °[facility] °[address] °[place], [place] °[phone] ° °[facility]     °(Inpatient and outpatient)     °[address]           °[phone]   ° °[facility] °[phone] (Suboxone and Methadone) ° °[address]      °[place], [place] [address]      °[phone]      ° °[address] °[place], [place] °852-3033 ° °[facility] (Outpatient/Inpatient, Chemical)    °(insurance only) [phone]      °       °[facility] (Groups & Residential) °[place], [place] °[phone] ° °   °[facility]     °[address]     °[place], [place]      °[phone]      ° °[facility] (for caregivers and family) °[address] °[place], [place] °336-299-4655 ° ° ° ° ° °Residential Treatment Programs °[facility]      °[address], [place], [place] [address]  °([phone]      ° °[facility] °[address] [place], [place] [address] °[phone] ° °[facility]        °[phone]      ° °[facility] °1-800-659-3381 ° °[facility]             °[address]                                         °[place], [place]                                                °877-615-2722 or 336-784-9470                              ° °[facility] [place] °[address] °[place], [address] °[phone] ° °[facility]    °[address]      °[place], [place]     °[phone]      ° °[facility] °[address] °[place], [place] °[phone] ° °[facility]   °[address]     °[place], [place] [address]     °[phone]      °Admissions: 8am-3pm M-F ° °[facility] °[address] °[place],  [place] °336-227-7417 ° °[facility]: Residential Program (90 Days)   °[place], [place]      °336-725-8389 or 800-758-6077    ° °[facility] °[place], [place] °(Walk in Hours over the weekend or by referral) ° °[facility] °[address], [place],  [address] °([phone] ° °Crisis Mobile: [facility]:  1-877-626-1772 (for crisis response 24 hours a day) °[facility] Hotline:      1-800-256-2452 °Outpatient Psychiatry and Counseling ° °[facility]: Mobile Crisis   Management 24 hours:  1-877-626-1772 ° °[facility] sliding scale fee and walk in schedule: M-F 8am-12pm/1pm-3pm °[address]  °[place], [place] [address] °[phone] ° °[facility] °[address] °[place], [place] [address] °336-703-9650 ° °[facility] ([facility])- new patient walk-in appointments available Monday - Friday 8am -3pm.          °[address] °[place], [place] [address] °336-676-6840 or crisis line- 336-676-6905 ° °[facility]/ Intensive Outpatient Therapy Program °[address] °[place], [place] [address] °336-832-9804 ° °[facility]                  °Crisis Services      °[phone]      °[address]     °[place], [place] [address]                ° °[facility]   °[facility] °[phone] °[address] °[place], [place] [address] ° ° °[facility]          °[address],  °[place], [place] [address]       °([phone] ° °[facility] °[address] °[place], [place] [address] °336-292-1510 ° °[facility]    °[address]    °[place], [place] [address]     °336-632-3505      ° °Parish McKinney, MD     °[address]     °[place] [address]     °336-282-1251     °  °[facility] °[address] °[place] [address] ° °[facility]     °[address]     °[place], [place]      °336-542-2076      ° °[facility] °Shamsher Ahluwalia, MD °[address] °[place], [place] [address] °336-420-9558 ° °[facility]     °[address]     °[place], [place] [address]     °336-274-1237      ° °[facility] °[address] °[place], [place] [address] °336-854-4450 °Resources for Temporary Residential Assistance/Crisis Centers ° °DAY CENTERS °[facility] °M-F 8am-3pm   °[address] [place], [place] [address]   336-332-0824 °Services include: laundry, barbering, support groups, case management, phone  & computer access, showers, AA/NA mtgs, mental health/substance abuse nurse, job skills class, disability information, VA assistance, spiritual classes, etc.  ° °HOMELESS SHELTERS ° °[facility]     °[facility]   °[address], [place]     °336.271.5959       °       °[facility] (women and children)       °[address] °[place], [place] [address] °336-275-0820 °[email or web address] for application and process °Application Required ° °[facility]   °[address]    °[place] [address]     °[phone]       °             °[facility] °[address] °[place], [place] [address] °336.273.5572 °336-235-0363(schedule application appt.) °Application Required ° °[facility] (women only)    °[address]     °[place], [place] [address]     °336-884-1039      °  Intake starts 6pm daily °Need valid ID, SSC, & Police report °[facility] [place] °[address] °[place], [place] °336-881-5420 °Application Required ° °[facility] (men only)     °[address]      °[place], [place]     °[phone]      ° °[facility] °(Pregnant women only) °[address] °[place], [place] °336-275-0206 ° °[facility]      °[address]      °[place], [place] [address]     °[phone]      °       °[facility] [place] [facility] °[address] °[place], [place] °336-723-1848 °90 day commitment/SA/Application process ° °[facility](men only)     °[address]     °[place], [place]     °[phone]       °Check-in at 7pm     °       °[facility] °[address] °[place], [place] [address] °336-248-6684 °Men/Women/Women and Children must be there by 7 pm ° °[facility] °[place], [place] °336-722-8721                ° °

## 2019-04-26 NOTE — ED Provider Notes (Signed)
[facility] EMERGENCY DEPARTMENT Provider Note   CSN: [id number] Arrival date & time: 04/26/19  0343   Time seen 4:30 AM  History Chief Complaint  Patient presents with  . V70.1    Joshua Thomas is a 44 y.o. male.  HPI   Patient presents to the emergency department via the sheriff's department after his mother filled out IVC papers on him for drug use.  Please see copy of her complaints but she states he has "referenced suicide".  She also states she is fearful for other people to "come in contact with him".  But she does not elaborate.  Patient states he has been doing drugs "all my life".  He states he started using meth in June.  He states he has an appointment on the 19th at ? [facility] to start drug rehab.  He states he wants to see his grandson this week and first.  When asked if he was feeling like he is going to harm himself or other people he states no.  When asked why his mother would say that he states he did not know although he states he had made comments such as "you are probably go live longer than me".  His mother stated he is not taking his psych meds however he states he does not take them for a long period of time.  His mother also had a lot of complaints, she states he cannot swallow.  He states he was told 3 years ago he needed to have a biopsy done in his throat however he did not follow-up with it.  His mother said he had a spider bite on his hand however he states he did have some type of infection on his hand but it is better now.  She also said he was complaining of abdominal pain.  He has bilateral inguinal hernias that he state get big when he stands up.  However when he lays down they go back in.  He states he has had that for several months.  When I look at his charts at [facility] he was diagnosed with a left inguinal hernia and December 2019.  He has multiple visits for kidney stones but I do not see where he had the problem with his throat.  PCP Patient,  No Pcp Per   Past Medical History:  Diagnosis Date  . ADHD (attention deficit hyperactivity disorder)   . Bipolar 1 disorder (HCC)   . Chronic back pain   . Chronic pain    chronic l leg pain  . 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 COPD (chronic obstructive pulmonary disease) (HCC)   . DDD (degenerative disc disease), lumbar   . Degenerative disc disease, lumbar   . Depression   . Discitis   . GERD (gastroesophageal reflux disease)   . Hepatitis C   . Hepatitis C   . History of stomach ulcers   . Hx of diabetes mellitus    due to infection  . Hypertension   . Lung nodules    3 on L and 2 on R  . PTSD (post-traumatic stress disorder)   . Sciatica   . Substance abuse [facility])     Patient Active Problem List   Diagnosis Date Noted  . Chronic low back pain 09/23/2017  . Essential hypertension 09/23/2017  . Hyperglycemia 06/03/2017  . GERD (gastroesophageal reflux disease) 05/17/2017  . Bipolar disorder (HCC) 05/17/2017  . Opioid use disorder,  moderate, dependence (HCC) 05/23/2015  . Chronic hepatitis C virus genotype 1a infection (HCC)     Past Surgical History:  Procedure Laterality Date  . FRACTURE SURGERY    . HARDWARE REMOVAL  01/27/2012   Procedure: HARDWARE REMOVAL;  Surgeon: Kathryne Hitch, MD;  Location: [facility] ORS;  Service: Orthopedics;  Laterality: Left;  . IR LUMBAR DISC ASPIRATION W/IMG GUIDE  06/08/2017  . RADIOLOGY WITH ANESTHESIA N/A 06/08/2017   Procedure: DISC ASPIRATION;  Surgeon: Julieanne Cotton, MD;  Location: [facility] OR;  Service: Radiology;  Laterality: N/A;  . TIBIA IM NAIL INSERTION  01/27/2012   Procedure: INTRAMEDULLARY (IM) NAIL TIBIAL;  Surgeon: Kathryne Hitch, MD;  Location: [facility] ORS;  Service: Orthopedics;  Laterality: Left;  Removal of IM Rod and Screws Left Tibia with Exchange Nail, Allograft Bone Graft Left Tibia       Family History  Problem Relation Age of Onset  . Diabetes Mother     Social  History   Tobacco Use  . Smoking status: Current Every Day Smoker    Packs/day: 1.00    Years: 33.00    Pack years: 33.00    Types: E-cigarettes, Cigarettes  . Smokeless tobacco: Former Neurosurgeon    Quit date: 08/08/2006  . Tobacco comment: 1 PPD  Substance Use Topics  . Alcohol use: Yes    Comment: occasionally  . Drug use: Yes    Types: Cocaine, IV    Comment: Pt states he "shoots whatever is available".  last used 1 week ago     Home Medications Prior to Admission medications   Medication Sig Start Date End Date Taking? Authorizing Provider  buprenorphine-naloxone (SUBOXONE) 8-2 mg SUBL SL tablet PLACE 1 TABLET UNDER THE TONGUE 2 (TWO) TIMES DAILY. 07/13/18   [name], MD  cephALEXin (KEFLEX) 500 MG capsule Take 500 mg by mouth 4 (four) times daily. 7 day course starting on 07/16/2018 07/16/18   [provider]  cyclobenzaprine (FLEXERIL) 10 MG tablet Take 0.5 tablets (5 mg total) by mouth 3 (three) times daily as needed for muscle spasms. Patient taking differently: Take 10 mg by mouth 3 (three) times daily.  02/14/18   Gust Rung, DO  DULoxetine (CYMBALTA) 30 MG capsule Please take 30mg  (1 tablet) daily for one week and then increase to 60mg  (2 tablets) daily. Patient taking differently: Take 30 mg by mouth daily.  02/14/18   , MD  HYDROcodone-acetaminophen (NORCO/VICODIN) 5-325 MG tablet Take 1 tablet by mouth 3 (three) times daily as needed for severe pain. 10/22/18   Lanelle Bal, MD  oxyCODONE (OXY IR/ROXICODONE) 5 MG immediate release tablet Take 5 mg by mouth every 4 (four) hours as needed for moderate pain.  07/16/18   [provider]  phenazopyridine (PYRIDIUM) 100 MG tablet Take 100 mg by mouth 2 (two) times daily. 07/16/18   [provider]  tamsulosin (FLOMAX) 0.4 MG CAPS capsule Take 0.4 mg by mouth daily. 07/16/18   [provider]    Allergies    Patient has no known allergies.  Review of Systems   Review  of Systems  All other systems reviewed and are negative.   Physical Exam Updated Vital Signs BP (!) 143/98   Pulse 94   Temp 98.5 F (36.9 C)   Resp 18   Ht 5\' 3"  (1.6 m)   Wt 68 kg   SpO2 100%   BMI 26.56 kg/m   Physical Exam Vitals and nursing note reviewed.  Constitutional:  Appearance: Normal appearance. He is normal weight.     Comments: Very short male  HENT:     Head: Normocephalic and atraumatic.     Right Ear: External ear normal.     Left Ear: External ear normal.  Eyes:     Extraocular Movements: Extraocular movements intact.     Conjunctiva/sclera: Conjunctivae normal.     Pupils: Pupils are equal, round, and reactive to light.  Cardiovascular:     Rate and Rhythm: Normal rate and regular rhythm.  Pulmonary:     Effort: Pulmonary effort is normal. No respiratory distress.  Abdominal:     General: Abdomen is flat. Bowel sounds are normal.     Palpations: Abdomen is soft.  Genitourinary:    Comments: I was examining patient in the hall, while he was laying down he does have some fullness in his bilateral inguinal areas medially.  The fullness is soft and not hard.  He states if he stands up they get bigger. Musculoskeletal:        General: Normal range of motion.     Cervical back: Normal range of motion.  Skin:    General: Skin is warm and dry.     Capillary Refill: Capillary refill takes less than 2 seconds.     Comments: Patient has some dried areas between his little and ring finger on the right where it looks like he probably did have some type of infection but it is dried up now.  There is no swelling or redness seen.  Neurological:     General: No focal deficit present.     Mental Status: He is alert and oriented to person, place, and time.     Cranial Nerves: No cranial nerve deficit.  Psychiatric:        Mood and Affect: Mood normal.        Behavior: Behavior normal.        Thought Content: Thought content normal.     ED Results /  Procedures / Treatments   Labs (all labs ordered are listed, but only abnormal results are displayed) Results for orders placed or performed during the hospital encounter of 04/26/19  Comprehensive metabolic panel  Result Value Ref Range   Sodium 135 135 - 145 mmol/L   Potassium 4.1 3.5 - 5.1 mmol/L   Chloride 103 98 - 111 mmol/L   CO2 25 22 - 32 mmol/L   Glucose, Bld 176 (H) 70 - 99 mg/dL   BUN 18 6 - 20 mg/dL   Creatinine, Ser 1.09 0.61 - 1.24 mg/dL   Calcium 9.0 8.9 - 10.3 mg/dL   Total Protein 6.9 6.5 - 8.1 g/dL   Albumin 3.5 3.5 - 5.0 g/dL   AST 39 15 - 41 U/L   ALT 82 (H) 0 - 44 U/L   Alkaline Phosphatase 48 38 - 126 U/L   Total Bilirubin 0.4 0.3 - 1.2 mg/dL   GFR calc non Af Amer >60 >60 mL/min   GFR calc Af Amer >60 >60 mL/min   Anion gap 7 5 - 15  CBC with Differential  Result Value Ref Range   WBC 6.0 4.0 - 10.5 K/uL   RBC 4.26 4.22 - 5.81 MIL/uL   Hemoglobin 13.0 13.0 - 17.0 g/dL   HCT 41.0 39.0 - 52.0 %   MCV 96.2 80.0 - 100.0 fL   MCH 30.5 26.0 - 34.0 pg   MCHC 31.7 30.0 - 36.0 g/dL   RDW 13.6 11.5 - 15.5 %  Platelets 158 150 - 400 K/uL   nRBC 0.0 0.0 - 0.2 %   Neutrophils Relative % 45 %   Neutro Abs 2.7 1.7 - 7.7 K/uL   Lymphocytes Relative 42 %   Lymphs Abs 2.5 0.7 - 4.0 K/uL   Monocytes Relative 7 %   Monocytes Absolute 0.4 0.1 - 1.0 K/uL   Eosinophils Relative 6 %   Eosinophils Absolute 0.3 0.0 - 0.5 K/uL   Basophils Relative 0 %   Basophils Absolute 0.0 0.0 - 0.1 K/uL   Immature Granulocytes 0 %   Abs Immature Granulocytes 0.01 0.00 - 0.07 K/uL  Acetaminophen level  Result Value Ref Range   Acetaminophen (Tylenol), Serum <10 (L) 10 - 30 ug/mL  Salicylate level  Result Value Ref Range   Salicylate Lvl <7.0 (L) 7.0 - 30.0 mg/dL  Urine rapid drug screen (hosp performed)  Result Value Ref Range   Opiates NONE DETECTED NONE DETECTED   Cocaine NONE DETECTED NONE DETECTED   Benzodiazepines POSITIVE (A) NONE DETECTED   Amphetamines POSITIVE (A)  NONE DETECTED   Tetrahydrocannabinol NONE DETECTED NONE DETECTED   Barbiturates NONE DETECTED NONE DETECTED   Laboratory interpretation all normal except nonfasting hyperglycemia, minor elevation of SGPT however he has a history of hepatitis C   EKG None  Radiology No results found.  Procedures Procedures (including critical care time)      Medications Ordered in ED Medications - No data to display  ED Course  I have reviewed the triage vital signs and the nursing notes.  Pertinent labs & imaging results that were available during my care of the patient were reviewed by me and considered in my medical decision making (see chart for details).    MDM Rules/Calculators/A&P                     I do not see where he had colonoscopy or endoscopy or even any imaging studies of his neck.  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  He has been on Suboxone in the past.  Review of the [place] shows he was on Suboxone through July 2020.  His overdose risk score is 660.  Patient is denying any suicidal ideation, unfortunately his mother is very vague about what his suicidal threats were.  I will have TTS evaluate, I suspect he will be seen by the psychiatrist in the morning.  6:20 AM patient has been evaluated by TTS.  They recommend the psychiatrist evaluate in the morning.  I did not do the second opinion because it is not clear to me whether patient needs to be committed or not.  My gut feeling is that he will be released by the psychiatrist this morning.  Final Clinical Impression(s) / ED Diagnoses Final diagnoses:  Methamphetamine abuse (HCC)    Rx / DC Orders  Disposition pending  Devoria Albe, MD, Concha Pyo, MD 04/26/19 [phone]

## 2019-04-26 NOTE — ED Notes (Signed)
TTS complete 

## 2019-04-26 NOTE — ED Provider Notes (Signed)
Blood pressure (!) 143/98, pulse 94, temperature 98.5 F (36.9 C), resp. rate 18, height 5\' 3"  (1.6 m), weight 68 kg, SpO2 100 %.  Assuming care from Dr. .  In short, Joshua Thomas is a 44 y.o. male with a chief complaint of V70.1 .  Refer to the original H&P for additional details.  11:45 AM Spoke with behavioral health who reevaluated the patient.  They feel he is safe for discharge.  Patient on my reevaluation denies any suicidal or homicidal ideation.  I will provide substance abuse information regarding community health resources.  I have reviewed his IVC paperwork and will resend this IVC. He will be following up with [facility] in [place], [place].     [place], MD 04/26/19 1146

## 2019-04-26 NOTE — ED Notes (Signed)
Called [employer] to get an Technical sales engineer to transport Pt back  Home.  They will send one as soon as one is available.

## 2019-05-09 ENCOUNTER — Other Ambulatory Visit: Payer: Self-pay

## 2019-05-09 ENCOUNTER — Emergency Department (HOSPITAL_COMMUNITY)
Admission: EM | Admit: 2019-05-09 | Discharge: 2019-05-10 | Disposition: A | Discharge status: Home / Self Care | Payer: Self-pay | Attending: Emergency Medicine | Admitting: Emergency Medicine

## 2019-05-09 ENCOUNTER — Encounter (HOSPITAL_COMMUNITY): Payer: Self-pay | Admitting: Emergency Medicine

## 2019-05-09 DIAGNOSIS — J449 Chronic obstructive pulmonary disease, unspecified: Secondary | ICD-10-CM | POA: Insufficient documentation

## 2019-05-09 DIAGNOSIS — K4020 Bilateral inguinal hernia, without obstruction or gangrene, not specified as recurrent: Secondary | ICD-10-CM | POA: Insufficient documentation

## 2019-05-09 DIAGNOSIS — F17210 Nicotine dependence, cigarettes, uncomplicated: Secondary | ICD-10-CM | POA: Insufficient documentation

## 2019-05-09 DIAGNOSIS — F909 Attention-deficit hyperactivity disorder, unspecified type: Secondary | ICD-10-CM | POA: Insufficient documentation

## 2019-05-09 DIAGNOSIS — R1084 Generalized abdominal pain: Secondary | ICD-10-CM

## 2019-05-09 DIAGNOSIS — Z79899 Other long term (current) drug therapy: Secondary | ICD-10-CM | POA: Insufficient documentation

## 2019-05-09 DIAGNOSIS — I10 Essential (primary) hypertension: Secondary | ICD-10-CM | POA: Insufficient documentation

## 2019-05-09 NOTE — ED Triage Notes (Signed)
Pt C/O abdominal pain X 1 year. Pt reports 2 knots in his lower abdomen.

## 2019-05-10 ENCOUNTER — Emergency Department (HOSPITAL_COMMUNITY): Payer: Self-pay

## 2019-05-10 LAB — CBC WITH DIFFERENTIAL/PLATELET
Abs Immature Granulocytes: 0.01 10*3/uL (ref 0.00–0.07)
Basophils Absolute: 0 10*3/uL (ref 0.0–0.1)
Basophils Relative: 0 %
Eosinophils Absolute: 0.3 10*3/uL (ref 0.0–0.5)
Eosinophils Relative: 5 %
HCT: 41.4 % (ref 39.0–52.0)
Hemoglobin: 13.4 g/dL (ref 13.0–17.0)
Immature Granulocytes: 0 %
Lymphocytes Relative: 49 %
Lymphs Abs: 2.9 10*3/uL (ref 0.7–4.0)
MCH: 30.7 pg (ref 26.0–34.0)
MCHC: 32.4 g/dL (ref 30.0–36.0)
MCV: 94.7 fL (ref 80.0–100.0)
Monocytes Absolute: 0.5 10*3/uL (ref 0.1–1.0)
Monocytes Relative: 8 %
Neutro Abs: 2.3 10*3/uL (ref 1.7–7.7)
Neutrophils Relative %: 38 %
Platelets: 147 10*3/uL — ABNORMAL LOW (ref 150–400)
RBC: 4.37 MIL/uL (ref 4.22–5.81)
RDW: 13.5 % (ref 11.5–15.5)
WBC: 6.1 10*3/uL (ref 4.0–10.5)
nRBC: 0 % (ref 0.0–0.2)

## 2019-05-10 LAB — URINALYSIS, ROUTINE W REFLEX MICROSCOPIC
Bacteria, UA: NONE SEEN
Bilirubin Urine: NEGATIVE
Glucose, UA: NEGATIVE mg/dL
Ketones, ur: NEGATIVE mg/dL
Leukocytes,Ua: NEGATIVE
Nitrite: NEGATIVE
Protein, ur: 30 mg/dL — AB
Specific Gravity, Urine: 1.012 (ref 1.005–1.030)
pH: 5 (ref 5.0–8.0)

## 2019-05-10 LAB — COMPREHENSIVE METABOLIC PANEL
ALT: 111 U/L — ABNORMAL HIGH (ref 0–44)
AST: 48 U/L — ABNORMAL HIGH (ref 15–41)
Albumin: 3.9 g/dL (ref 3.5–5.0)
Alkaline Phosphatase: 52 U/L (ref 38–126)
Anion gap: 8 (ref 5–15)
BUN: 18 mg/dL (ref 6–20)
CO2: 25 mmol/L (ref 22–32)
Calcium: 8.9 mg/dL (ref 8.9–10.3)
Chloride: 102 mmol/L (ref 98–111)
Creatinine, Ser: 0.78 mg/dL (ref 0.61–1.24)
GFR calc Af Amer: >60 mL/min (ref >60)
GFR calc non Af Amer: >60 mL/min (ref >60)
Glucose, Bld: 127 mg/dL — ABNORMAL HIGH (ref 70–99)
Potassium: 3.9 mmol/L (ref 3.5–5.1)
Sodium: 135 mmol/L (ref 135–145)
Total Bilirubin: 0.6 mg/dL (ref 0.3–1.2)
Total Protein: 7.4 g/dL (ref 6.5–8.1)

## 2019-05-10 LAB — RAPID URINE DRUG SCREEN, HOSP PERFORMED
Amphetamines: POSITIVE — AB
Barbiturates: NOT DETECTED
Benzodiazepines: NOT DETECTED
Cocaine: NOT DETECTED
Opiates: NOT DETECTED
Tetrahydrocannabinol: NOT DETECTED

## 2019-05-10 LAB — SALICYLATE LEVEL: Salicylate Lvl: <7 mg/dL — ABNORMAL LOW (ref 7.0–30.0)

## 2019-05-10 LAB — LIPASE, BLOOD: Lipase: 86 U/L — ABNORMAL HIGH (ref 11–51)

## 2019-05-10 LAB — ACETAMINOPHEN LEVEL: Acetaminophen (Tylenol), Serum: <10 ug/mL — ABNORMAL LOW (ref 10–30)

## 2019-05-10 LAB — ETHANOL: Alcohol, Ethyl (B): <10 mg/dL (ref <10)

## 2019-05-10 MED ORDER — IOHEXOL 300 MG/ML  SOLN
100.0000 mL | Freq: Once | INTRAMUSCULAR | Status: AC | PRN
  Administered 2019-05-10: 100 mL via INTRAVENOUS

## 2019-05-10 NOTE — Discharge Instructions (Addendum)
Follow-up with the surgeon regarding your hernia.  This may need to be repaired.  Return to the ED with worsening pain, fever, vomiting, a bulge that you cannot push back in or any other concerns.

## 2019-05-10 NOTE — ED Provider Notes (Signed)
[facility] EMERGENCY DEPARTMENT Provider Note   CSN: [id number] Arrival date & time: 05/09/19  2032     History Chief Complaint  Patient presents with  . Abdominal Pain    Joshua Thomas is a 44 y.o. male.  Patient with a history of chronic back pain, bipolar disorder, hepatitis C, polysubstance abuse including methamphetamine and fentanyl here with requesting medical clearance.  States he is try to go to a halfway house and is requesting "a clean bill of health" before he goes there.  He states has been clean for 48 hours which was his last use of methamphetamine and fentanyl by IV route.  He denies any other drug use.  Denies any alcohol use.  He has had intermittent abdominal pain to his right side for the past 1 year and this is progressively worsening.  He is also concerned about "two knots" to his bilateral groin that is worse when he stands up.  He denies any vomiting, diarrhea, pain with urination or blood in the urine.  No fever.  No chest pain or shortness of breath.  No previous abdominal surgeries. He denies any suicidal ideation, homicidal ideation or hallucinations.  The history is provided by the patient.       Past Medical History:  Diagnosis Date  . ADHD (attention deficit hyperactivity disorder)   . Bipolar 1 disorder (HCC)   . Chronic back pain   . Chronic pain    chronic l leg pain  . 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 COPD (chronic obstructive pulmonary disease) (HCC)   . DDD (degenerative disc disease), lumbar   . Degenerative disc disease, lumbar   . Depression   . Discitis   . GERD (gastroesophageal reflux disease)   . Hepatitis C   . Hepatitis C   . History of stomach ulcers   . Hx of diabetes mellitus    due to infection  . Hypertension   . Lung nodules    3 on L and 2 on R  . PTSD (post-traumatic stress disorder)   . Sciatica   . Substance abuse [facility])     Patient Active Problem List   Diagnosis Date Noted    . Chronic low back pain 09/23/2017  . Essential hypertension 09/23/2017  . Hyperglycemia 06/03/2017  . GERD (gastroesophageal reflux disease) 05/17/2017  . Bipolar disorder (HCC) 05/17/2017  . Opioid use disorder, moderate, dependence (HCC) 05/23/2015  . Chronic hepatitis C virus genotype 1a infection (HCC)     Past Surgical History:  Procedure Laterality Date  . FRACTURE SURGERY    . HARDWARE REMOVAL  01/27/2012   Procedure: HARDWARE REMOVAL;  Surgeon: Kathryne Hitch, MD;  Location: [facility] ORS;  Service: Orthopedics;  Laterality: Left;  . IR LUMBAR DISC ASPIRATION W/IMG GUIDE  06/08/2017  . RADIOLOGY WITH ANESTHESIA N/A 06/08/2017   Procedure: DISC ASPIRATION;  Surgeon: Julieanne Cotton, MD;  Location: [facility] OR;  Service: Radiology;  Laterality: N/A;  . TIBIA IM NAIL INSERTION  01/27/2012   Procedure: INTRAMEDULLARY (IM) NAIL TIBIAL;  Surgeon: Kathryne Hitch, MD;  Location: [facility] ORS;  Service: Orthopedics;  Laterality: Left;  Removal of IM Rod and Screws Left Tibia with Exchange Nail, Allograft Bone Graft Left Tibia       Family History  Problem Relation Age of Onset  . Diabetes Mother     Social History   Tobacco Use  . Smoking status: Current Every Day Smoker    Packs/day:  1.00    Years: 33.00    Pack years: 33.00    Types: E-cigarettes, Cigarettes  . Smokeless tobacco: Former Neurosurgeon    Quit date: 08/08/2006  . Tobacco comment: 1 PPD  Substance Use Topics  . Alcohol use: Yes    Comment: occasionally  . Drug use: Yes    Types: Cocaine, IV, Marijuana, Methamphetamines    Comment: Pt states he "shoots whatever is available".  last used 1 week ago     Home Medications Prior to Admission medications   Medication Sig Start Date End Date Taking? Authorizing Provider  buprenorphine-naloxone (SUBOXONE) 8-2 mg SUBL SL tablet PLACE 1 TABLET UNDER THE TONGUE 2 (TWO) TIMES DAILY. 07/13/18   [name], MD  cephALEXin (KEFLEX) 500 MG capsule Take 500 mg by mouth  4 (four) times daily. 7 day course starting on 07/16/2018 07/16/18   [provider]  cyclobenzaprine (FLEXERIL) 10 MG tablet Take 0.5 tablets (5 mg total) by mouth 3 (three) times daily as needed for muscle spasms. Patient taking differently: Take 10 mg by mouth 3 (three) times daily.  02/14/18   Gust Rung, DO  DULoxetine (CYMBALTA) 30 MG capsule Please take 30mg  (1 tablet) daily for one week and then increase to 60mg  (2 tablets) daily. Patient taking differently: Take 30 mg by mouth daily.  02/14/18   , MD  HYDROcodone-acetaminophen (NORCO/VICODIN) 5-325 MG tablet Take 1 tablet by mouth 3 (three) times daily as needed for severe pain. 10/22/18   Lanelle Bal, MD  oxyCODONE (OXY IR/ROXICODONE) 5 MG immediate release tablet Take 5 mg by mouth every 4 (four) hours as needed for moderate pain.  07/16/18   [provider]  phenazopyridine (PYRIDIUM) 100 MG tablet Take 100 mg by mouth 2 (two) times daily. 07/16/18   [provider]  tamsulosin (FLOMAX) 0.4 MG CAPS capsule Take 0.4 mg by mouth daily. 07/16/18   [provider]    Allergies    Patient has no known allergies.  Review of Systems   Review of Systems  Constitutional: Negative for activity change, appetite change and fever.  HENT: Negative for congestion and rhinorrhea.   Respiratory: Negative for cough and shortness of breath.   Cardiovascular: Negative for chest pain.  Gastrointestinal: Positive for abdominal pain and nausea. Negative for vomiting.  Genitourinary: Negative for dysuria and hematuria.  Musculoskeletal: Negative for arthralgias, back pain and myalgias.  Skin: Negative for rash.  Neurological: Negative for dizziness, weakness and headaches.  Psychiatric/Behavioral: Negative for suicidal ideas.   all other systems are negative except as noted in the HPI and PMH.    Physical Exam Updated Vital Signs BP (!) 157/96   Pulse 95   Temp 98.1 F (36.7 C)   Resp 18   Ht  5\' 3"  (1.6 m)   Wt 67.6 kg   SpO2 100%   BMI 26.39 kg/m   Physical Exam Vitals and nursing note reviewed.  Constitutional:      General: He is not in acute distress.    Appearance: He is well-developed.  HENT:     Head: Normocephalic and atraumatic.     Mouth/Throat:     Pharynx: No oropharyngeal exudate.  Eyes:     Conjunctiva/sclera: Conjunctivae normal.     Pupils: Pupils are equal, round, and reactive to light.  Neck:     Comments: No meningismus. Cardiovascular:     Rate and Rhythm: Normal rate and regular rhythm.     Heart sounds: Normal  heart sounds. No murmur.  Pulmonary:     Effort: Pulmonary effort is normal. No respiratory distress.     Breath sounds: Normal breath sounds.  Abdominal:     Palpations: Abdomen is soft.     Tenderness: There is no abdominal tenderness. There is no guarding or rebound.     Comments: Right lower quadrant tenderness, no guarding or rebound.  There is no testicular tenderness.  Reducible right inguinal hernia that is soft  Musculoskeletal:        General: No tenderness. Normal range of motion.     Cervical back: Normal range of motion and neck supple.  Skin:    General: Skin is warm.     Capillary Refill: Capillary refill takes less than 2 seconds.  Neurological:     General: No focal deficit present.     Mental Status: He is alert and oriented to person, place, and time. Mental status is at baseline.     Cranial Nerves: No cranial nerve deficit.     Motor: No abnormal muscle tone.     Coordination: Coordination normal.     Comments: No ataxia on finger to nose bilaterally. No pronator drift. 5/5 strength throughout. CN 2-12 intact.Equal grip strength. Sensation intact.   Psychiatric:        Behavior: Behavior normal.     ED Results / Procedures / Treatments   Labs (all labs ordered are listed, but only abnormal results are displayed) Labs Reviewed  CBC WITH DIFFERENTIAL/PLATELET - Abnormal; Notable for the following  components:      Result Value   Platelets 147 (*)    All other components within normal limits  COMPREHENSIVE METABOLIC PANEL - Abnormal; Notable for the following components:   Glucose, Bld 127 (*)    AST 48 (*)    ALT 111 (*)    All other components within normal limits  LIPASE, BLOOD - Abnormal; Notable for the following components:   Lipase 86 (*)    All other components within normal limits  URINALYSIS, ROUTINE W REFLEX MICROSCOPIC - Abnormal; Notable for the following components:   Hgb urine dipstick SMALL (*)    Protein, ur 30 (*)    All other components within normal limits  RAPID URINE DRUG SCREEN, HOSP PERFORMED - Abnormal; Notable for the following components:   Amphetamines POSITIVE (*)    All other components within normal limits  ACETAMINOPHEN LEVEL - Abnormal; Notable for the following components:   Acetaminophen (Tylenol), Serum <10 (*)    All other components within normal limits  SALICYLATE LEVEL - Abnormal; Notable for the following components:   Salicylate Lvl <7.0 (*)    All other components within normal limits  ETHANOL    EKG None  Radiology CT ABDOMEN PELVIS W CONTRAST  Result Date: 05/10/2019 CLINICAL DATA:  Right lower quadrant pain EXAM: CT ABDOMEN AND PELVIS WITH CONTRAST TECHNIQUE: Multidetector CT imaging of the abdomen and pelvis was performed using the standard protocol following bolus administration of intravenous contrast. CONTRAST:  OMNIPAQUE IOHEXOL 300 MG/ML  SOLN COMPARISON:  July 15, 2018 FINDINGS: Lower chest: The visualized heart size within normal limits. No pericardial fluid/thickening. No hiatal hernia. The visualized portions of the lungs are clear. Hepatobiliary: Although limited due to the lack of intravenous contrast, normal in appearance without gross focal abnormality. No evidence of calcified gallstones or biliary ductal dilatation. Pancreas:  Unremarkable.  No surrounding inflammatory changes. Spleen: Normal in size. Although  limited due to the lack of  intravenous contrast, normal in appearance. Again noted are 2 metallic densities seen within the spleen. Adrenals/Urinary Tract: Both adrenal glands appear normal. Scattered small hypodense lesions are seen within both kidneys the largest measuring 1.1 cm in the upper pole of the left kidney. The bladder is unremarkable. Stomach/Bowel: The stomach and small bowel are normal in appearance. The appendix is seen within the right lower quadrant extending into the right inguinal hernia, series 2, image 66. However no significant fat stranding changes are seen around the appendix. The remainder of the colon is unremarkable. There is a moderate amount of colonic stool present. Vascular/Lymphatic: There are no enlarged abdominal or pelvic lymph nodes. Scattered minimal aortic atherosclerosis is noted. Reproductive: The prostate is unremarkable. Other: No evidence of abdominal wall mass or hernia. Musculoskeletal: No acute or significant osseous findings. Again noted is multilevel degenerative changes most notable at L2-L3 with significant endplate sclerosis and irregularity. There is slight anterior compression deformity of the L1 vertebral body with less than 25% loss in height. Anterior osteophytes are seen within the lower lumbar spine. IMPRESSION: Appendix extending into the right inguinal hernia, however there is no definite evidence of acute appendicitis. Moderate to large amount of colonic stool. Aortic Atherosclerosis (ICD10-I70.0). Electronically Signed   By: Prudencio Pair M.D.   On: 05/10/2019 01:12    Procedures Procedures (including critical care time)  Medications Ordered in ED Medications - No data to display  ED Course  I have reviewed the triage vital signs and the nursing notes.  Pertinent labs & imaging results that were available during my care of the patient were reviewed by me and considered in my medical decision making (see chart for details).    MDM  Rules/Calculators/A&P                     Patient with polysubstance abuse here requesting medical clearance for rehab.  He has had intermittent abdominal pain for the past 1 year with intermittent bulges to his groin.  This is consistent with inguinal hernias that are soft and reducible  Labs are reassuring.  Mild elevation of lipase at 86.  LFTs are normal.  Patient with no upper abdominal pain.  CT shows normal-appearing appendix that extends into right inguinal hernia.  Large amount of stool present.  Reassuring results discussed with patient.  Referral given a general surgery for possible after repair of his inguinal hernias.  No evidence of obstruction or incarceration or strangulation.  Appendix appears normal on CT scan.  Patient tolerating p.o. and ambulatory.  He is not suicidal or homicidal.  He is anticipating going to a halfway house in the morning for rehab. Return precautions discussed.  Final Clinical Impression(s) / ED Diagnoses Final diagnoses:  Generalized abdominal pain  Bilateral inguinal hernia without obstruction or gangrene, recurrence not specified    Rx / DC Orders ED Discharge Orders    None       Ronisha Herringshaw, Annie Main, MD 05/10/19 [phone]

## 2019-05-24 ENCOUNTER — Encounter: Payer: Self-pay | Admitting: Physician Assistant

## 2019-05-24 ENCOUNTER — Ambulatory Visit: Payer: Self-pay | Admitting: Physician Assistant

## 2019-05-24 VITALS — BP 133/82 | HR 66 | Temp 99.0°F | Ht 63.0 in | Wt 155.4 lb

## 2019-05-24 DIAGNOSIS — Z7689 Persons encountering health services in other specified circumstances: Secondary | ICD-10-CM

## 2019-05-24 DIAGNOSIS — R7989 Other specified abnormal findings of blood chemistry: Secondary | ICD-10-CM

## 2019-05-24 DIAGNOSIS — Z13220 Encounter for screening for lipoid disorders: Secondary | ICD-10-CM

## 2019-05-24 DIAGNOSIS — F1911 Other psychoactive substance abuse, in remission: Secondary | ICD-10-CM

## 2019-05-24 DIAGNOSIS — R9389 Abnormal findings on diagnostic imaging of other specified body structures: Secondary | ICD-10-CM

## 2019-05-24 DIAGNOSIS — F17200 Nicotine dependence, unspecified, uncomplicated: Secondary | ICD-10-CM

## 2019-05-24 DIAGNOSIS — Z8639 Personal history of other endocrine, nutritional and metabolic disease: Secondary | ICD-10-CM

## 2019-05-24 DIAGNOSIS — Z8619 Personal history of other infectious and parasitic diseases: Secondary | ICD-10-CM

## 2019-05-24 DIAGNOSIS — R918 Other nonspecific abnormal finding of lung field: Secondary | ICD-10-CM

## 2019-05-24 NOTE — Progress Notes (Signed)
BP 133/82   Pulse 66   Temp 99 F (37.2 C)   Ht 5\' 3"  (1.6 m)   Wt 155 lb 6.4 oz (70.5 kg)   SpO2 97%   BMI 27.53 kg/m    Subjective:    Patient ID: , male    DOB: 17-Feb-1975, 44 y.o.   MRN: [id number]  HPI: Joshua Thomas is a 44 y.o. male presenting on 05/24/2019 for No chief complaint on file.   HPI   Pt had a negative covid 19 screening questionnaire.      Pt is 44yoM who presents to establish care.   He has been At [facility] house for 9 days and says that is going okay.    Pt was previously seen at [facility] one time on 10/05/2017.  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  He is going to [facility]  He is feeling pretty good today and has no complaints.  He says he is commited to stopping drugs.    Relevant past medical, surgical, family and social history reviewed and updated as indicated. Interim medical history since our last visit reviewed. Allergies and medications reviewed and updated.   Current Outpatient Medications:  .  Aspirin-Acetaminophen-Caffeine (GOODY HEADACHE PO), Take by mouth., Disp: , Rfl:  .  ibuprofen (ADVIL) 200 MG tablet, Take 200 mg by mouth every 6 (six) hours as needed., Disp: , Rfl:  .  traZODone (DESYREL) 100 MG tablet, Take 100 mg by mouth at bedtime., Disp: , Rfl:    Review of Systems  Per HPI unless specifically indicated above     Objective:    BP 133/82   Pulse 66   Temp 99 F (37.2 C)   Ht 5\' 3"  (1.6 m)   Wt 155 lb 6.4 oz (70.5 kg)   SpO2 97%   BMI 27.53 kg/m   Wt Readings from Last 3 Encounters:  05/24/19 155 lb 6.4 oz (70.5 kg)  05/09/19 149 lb (67.6 kg)  04/26/19 149 lb 14.6 oz (68 kg)    Physical Exam Vitals reviewed.  Constitutional:      General: He is  not in acute distress.    Appearance: He is well-developed. He is not ill-appearing.  HENT:     Head: Normocephalic and atraumatic.  Eyes:     Conjunctiva/sclera: Conjunctivae normal.     Pupils: Pupils are equal, round, and reactive to light.  Neck:     Thyroid: No thyromegaly.  Cardiovascular:     Rate and Rhythm: Normal rate and regular rhythm.  Pulmonary:     Effort: Pulmonary effort is normal.     Breath sounds: Normal breath sounds. No wheezing or rales.  Abdominal:     General: Bowel sounds are normal.     Palpations: Abdomen is soft. There is no mass.     Tenderness: There is no abdominal tenderness.  Musculoskeletal:     Cervical back: Neck supple.     Right lower leg: No edema.     Left lower leg: No edema.  Lymphadenopathy:     Cervical: No cervical adenopathy.  Skin:    General: Skin is warm and dry.     Findings: No rash.  Neurological:     Mental Status: He is alert and oriented to person, place,  and time.  Psychiatric:        Attention and Perception: Attention normal.        Speech: Speech normal.        Behavior: Behavior normal. Behavior is cooperative.     Comments: Pt is very pleasant and cooperative.  He engages in conversation and asks good questions.             Assessment & Plan:    Encounter Diagnoses  Name Primary?  . Encounter to establish care Yes  . Substance abuse in remission ([place])   . Elevated LFTs   . History of hepatitis C   . Tobacco use disorder   . History of diabetes mellitus   . Pulmonary mass   . Abnormal CT of the chest   . Screening cholesterol level       -will update labs- Lipids, cmp, a1c,  Hep c -rechek CT chest- history of mass seen on CT 01/20/18- "left lower lobe pulmonary mass currently measuring 14 mm versus 18 mm previously" With recomendations to repeat in 3 months -pt is given [facility] / [facility] financial assistance application -pt to continue with [facility] and [facility] house -pt agrees to covid vaccination  and is sent next door to get it (vaccination clinic today) -pt to follow up  1 month.  He is to contact office sooner prn

## 2019-05-25 IMAGING — CR DG LUMBAR SPINE COMPLETE 4+V
5 series · 5 of 5 positions shown · non-contrast
Comparison: CT abdomen and pelvis 06/15/2016.

CLINICAL DATA: Chronic low back pain beginning last night.  Fall.

EXAM:
LUMBAR SPINE - COMPLETE 4+ VIEW

[t lumbar spine ap]
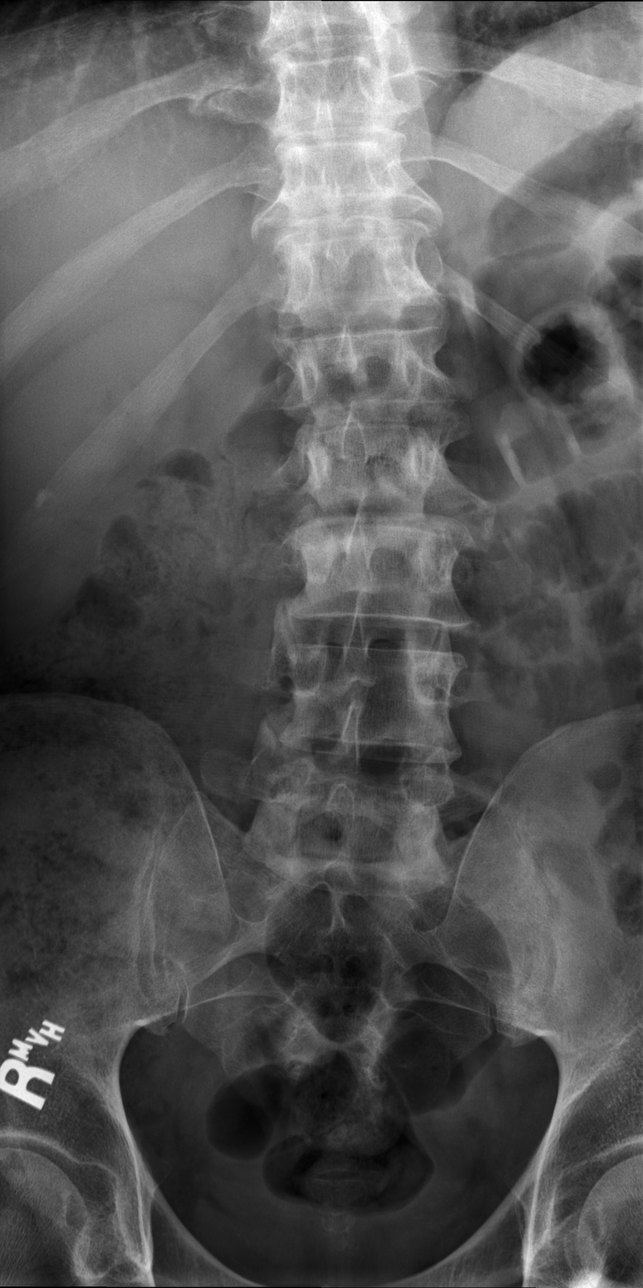

[t lumbar spine obl (1 of 2)]
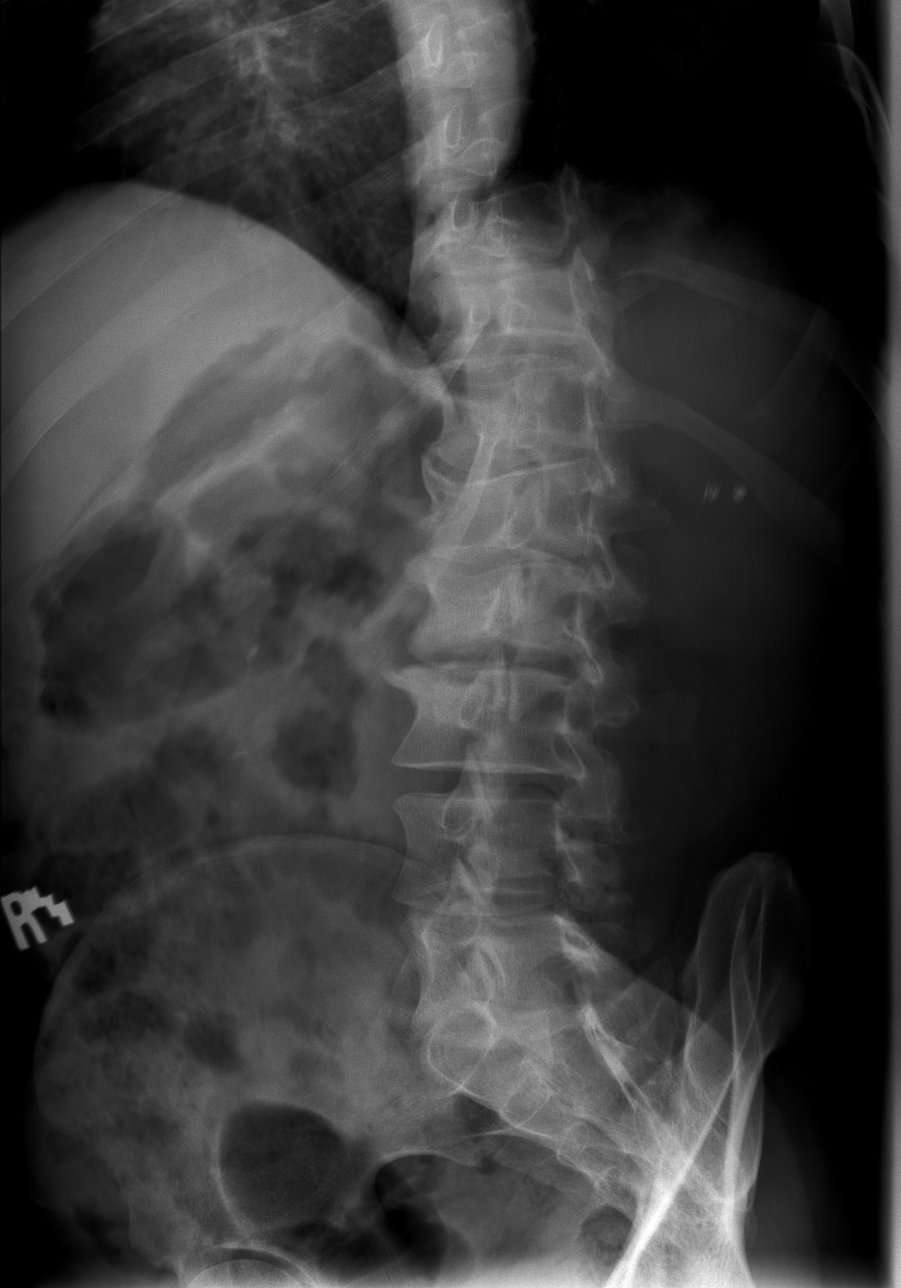

[t lumbar spine obl (2 of 2)]
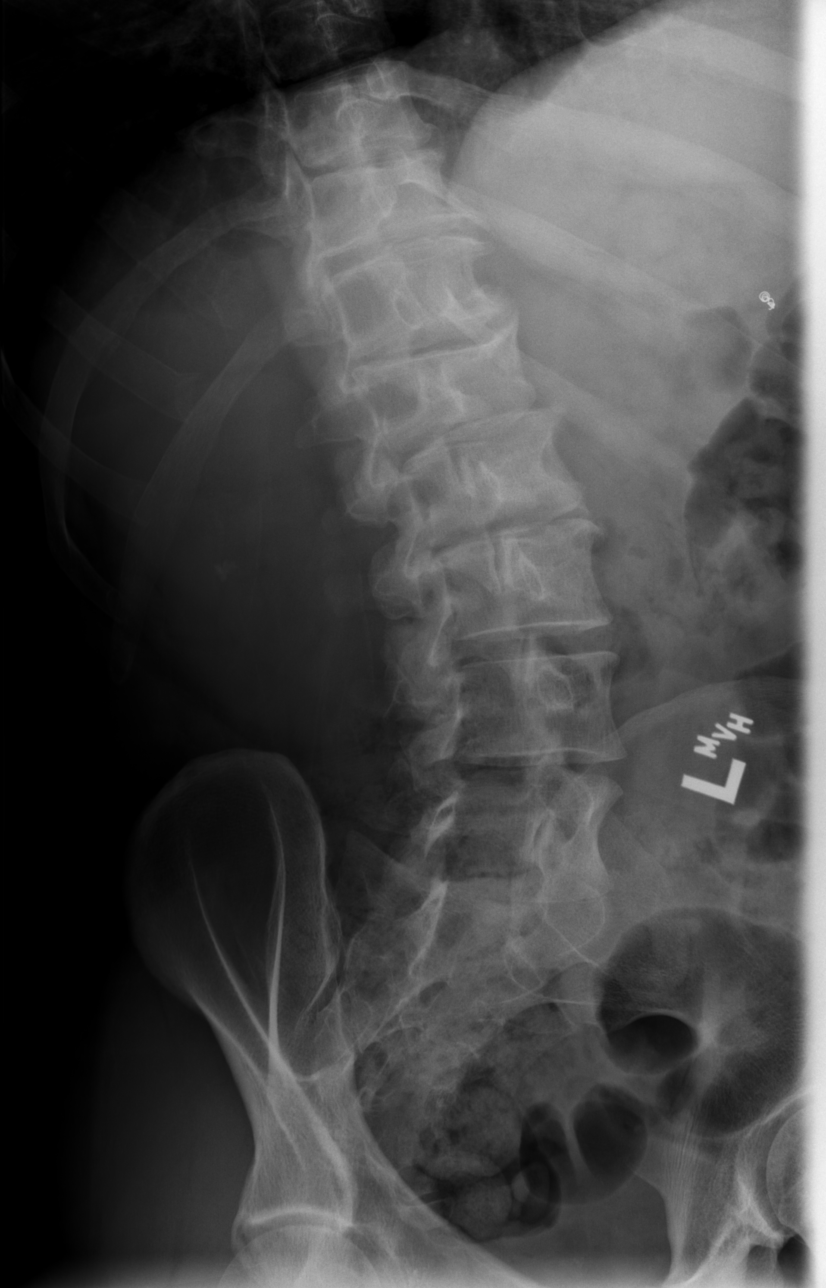

[t lumbar spine lat]
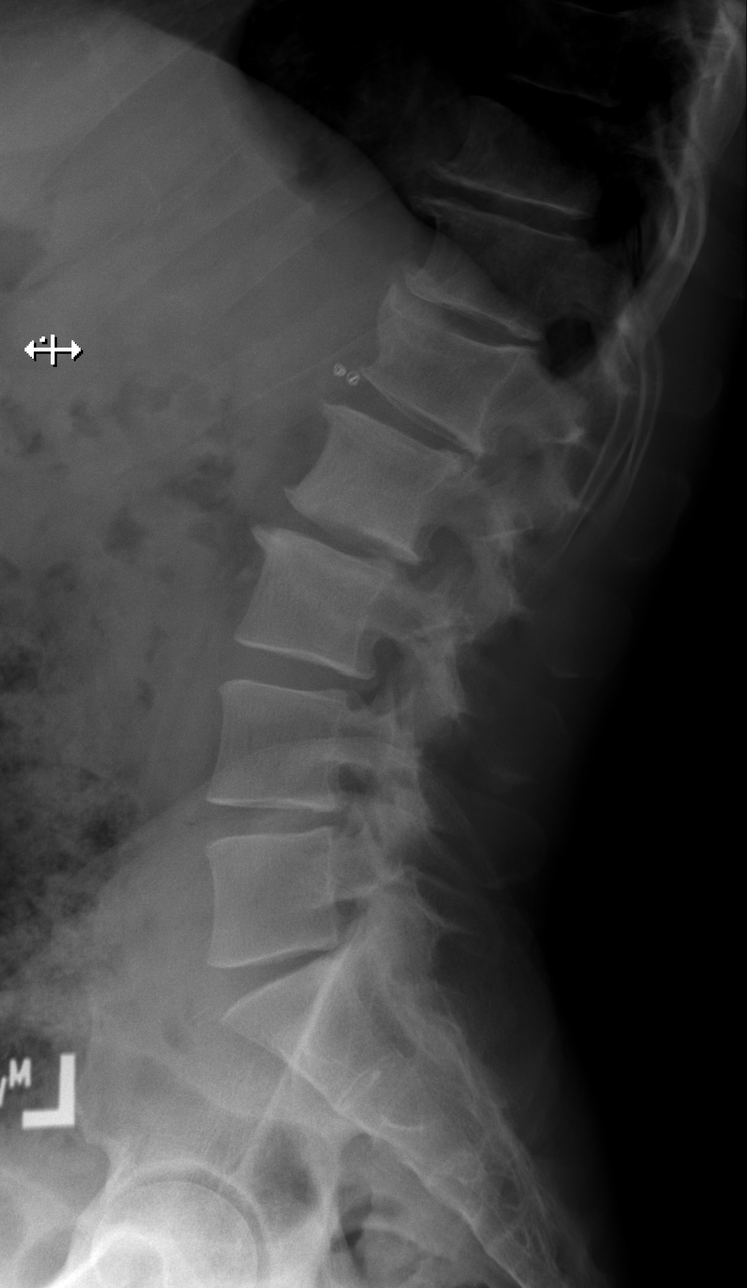

[t lumbar l-5 s-1 spot]
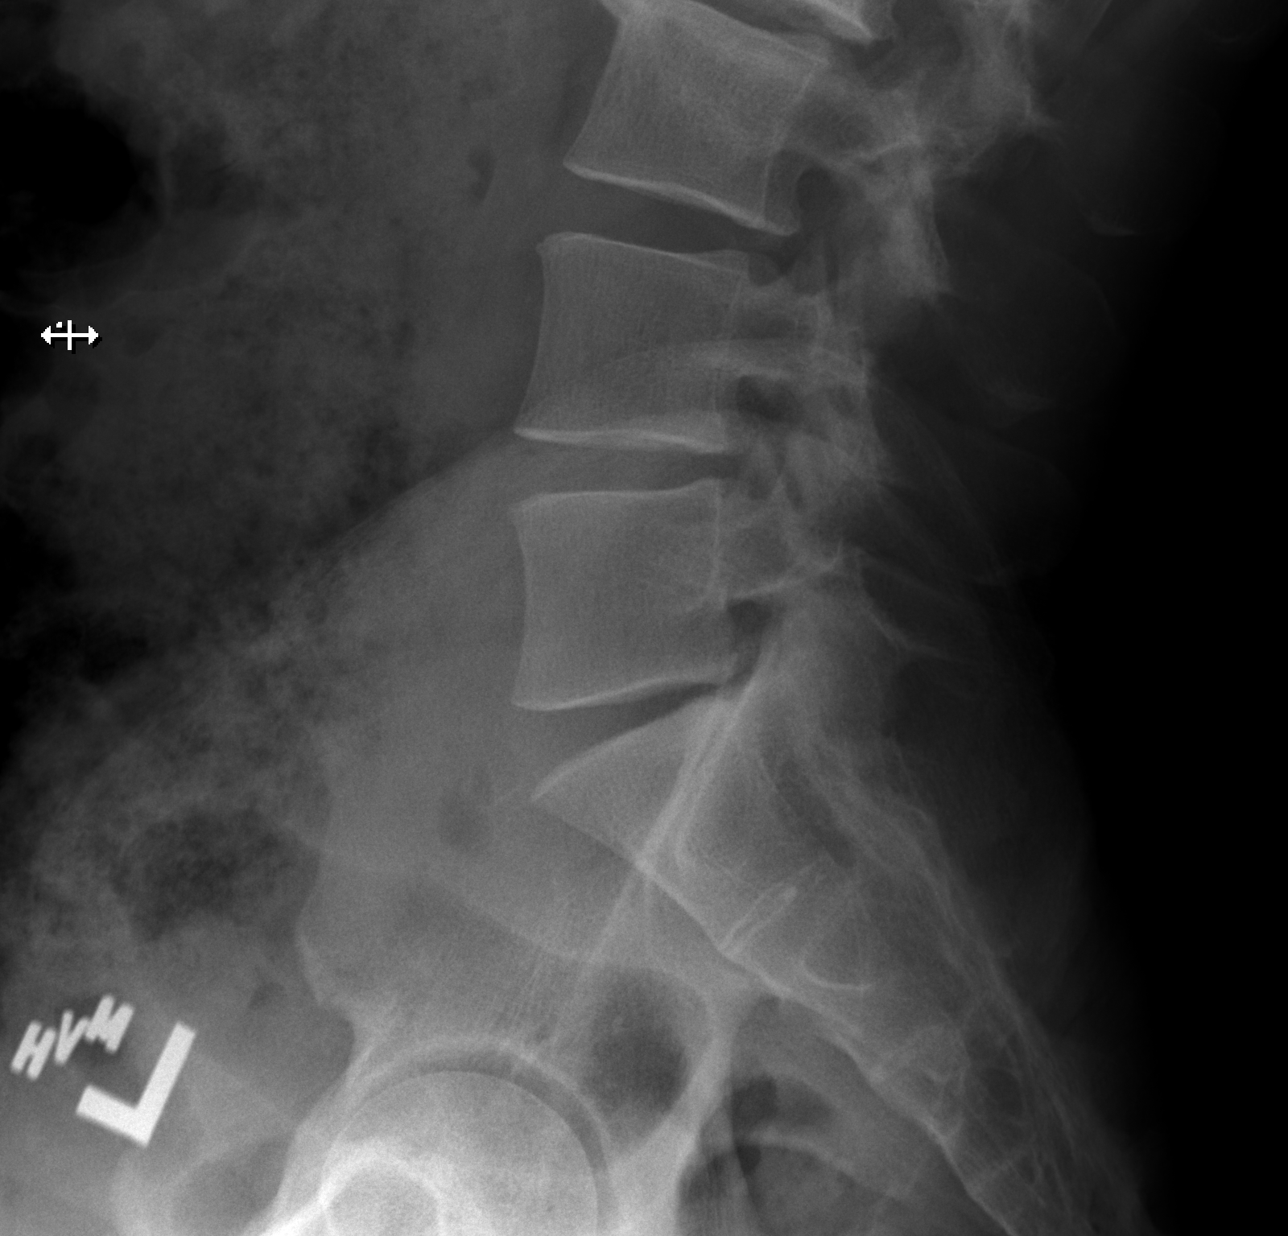

[5 of 5 positions shown; findings below may reference images not displayed]

FINDINGS: Five non rib-bearing lumbar type vertebral bodies are present.
Levoconvex curvature of the lumbar spine is centered at L2-3. Remote
superior endplate fracture is again noted at L1. Endplate sclerotic
changes are noted on the right at L 2-3. There is slight
retrolisthesis at L2-3. No acute fracture traumatic subluxation is
present.
IMPRESSION: 1. No acute abnormality.
2. Stable levoconvex curvature of the lumbar spine with asymmetric
right-sided endplate change.
3. Remote superior endplate L1 fracture.

## 2019-06-13 ENCOUNTER — Ambulatory Visit (HOSPITAL_COMMUNITY): Payer: Self-pay

## 2019-06-25 ENCOUNTER — Ambulatory Visit: Payer: Self-pay | Admitting: Physician Assistant

## 2019-06-26 ENCOUNTER — Ambulatory Visit (HOSPITAL_COMMUNITY): Payer: Self-pay

## 2020-02-14 ENCOUNTER — Encounter (HOSPITAL_COMMUNITY): Payer: Self-pay | Admitting: *Deleted

## 2020-02-14 ENCOUNTER — Emergency Department (HOSPITAL_COMMUNITY): Payer: Self-pay

## 2020-02-14 ENCOUNTER — Emergency Department (HOSPITAL_COMMUNITY)
Admission: EM | Admit: 2020-02-14 | Discharge: 2020-02-14 | Disposition: A | Discharge status: Home / Self Care | Payer: Self-pay | Attending: Emergency Medicine | Admitting: Emergency Medicine

## 2020-02-14 DIAGNOSIS — R1031 Right lower quadrant pain: Secondary | ICD-10-CM | POA: Insufficient documentation

## 2020-02-14 DIAGNOSIS — R109 Unspecified abdominal pain: Secondary | ICD-10-CM

## 2020-02-14 DIAGNOSIS — Z7982 Long term (current) use of aspirin: Secondary | ICD-10-CM | POA: Insufficient documentation

## 2020-02-14 DIAGNOSIS — R300 Dysuria: Secondary | ICD-10-CM

## 2020-02-14 DIAGNOSIS — I10 Essential (primary) hypertension: Secondary | ICD-10-CM | POA: Insufficient documentation

## 2020-02-14 DIAGNOSIS — J449 Chronic obstructive pulmonary disease, unspecified: Secondary | ICD-10-CM | POA: Insufficient documentation

## 2020-02-14 DIAGNOSIS — E119 Type 2 diabetes mellitus without complications: Secondary | ICD-10-CM | POA: Insufficient documentation

## 2020-02-14 DIAGNOSIS — F17210 Nicotine dependence, cigarettes, uncomplicated: Secondary | ICD-10-CM | POA: Insufficient documentation

## 2020-02-14 DIAGNOSIS — R10A0 Flank pain, unspecified side: Secondary | ICD-10-CM

## 2020-02-14 LAB — CBC WITH DIFFERENTIAL/PLATELET
Abs Immature Granulocytes: 0.02 10*3/uL (ref 0.00–0.07)
Basophils Absolute: 0 10*3/uL (ref 0.0–0.1)
Basophils Relative: 1 %
Eosinophils Absolute: 0.1 10*3/uL (ref 0.0–0.5)
Eosinophils Relative: 1 %
HCT: 47 % (ref 39.0–52.0)
Hemoglobin: 14.8 g/dL (ref 13.0–17.0)
Immature Granulocytes: 0 %
Lymphocytes Relative: 31 %
Lymphs Abs: 1.8 10*3/uL (ref 0.7–4.0)
MCH: 30.6 pg (ref 26.0–34.0)
MCHC: 31.5 g/dL (ref 30.0–36.0)
MCV: 97.3 fL (ref 80.0–100.0)
Monocytes Absolute: 0.5 10*3/uL (ref 0.1–1.0)
Monocytes Relative: 8 %
Neutro Abs: 3.4 10*3/uL (ref 1.7–7.7)
Neutrophils Relative %: 59 %
Platelets: 131 10*3/uL — ABNORMAL LOW (ref 150–400)
RBC: 4.83 MIL/uL (ref 4.22–5.81)
RDW: 13.5 % (ref 11.5–15.5)
WBC: 5.8 10*3/uL (ref 4.0–10.5)
nRBC: 0 % (ref 0.0–0.2)

## 2020-02-14 LAB — URINALYSIS, ROUTINE W REFLEX MICROSCOPIC
Bacteria, UA: NONE SEEN
Bilirubin Urine: NEGATIVE
Glucose, UA: >=500 mg/dL — AB
Hgb urine dipstick: NEGATIVE
Ketones, ur: NEGATIVE mg/dL
Leukocytes,Ua: NEGATIVE
Nitrite: NEGATIVE
Protein, ur: 30 mg/dL — AB
Specific Gravity, Urine: 1.007 (ref 1.005–1.030)
pH: 6 (ref 5.0–8.0)

## 2020-02-14 LAB — BASIC METABOLIC PANEL
Anion gap: 7 (ref 5–15)
BUN: 18 mg/dL (ref 6–20)
CO2: 29 mmol/L (ref 22–32)
Calcium: 9.2 mg/dL (ref 8.9–10.3)
Chloride: 100 mmol/L (ref 98–111)
Creatinine, Ser: 1.07 mg/dL (ref 0.61–1.24)
GFR, Estimated: >60 mL/min (ref >60)
Glucose, Bld: 169 mg/dL — ABNORMAL HIGH (ref 70–99)
Potassium: 4.6 mmol/L (ref 3.5–5.1)
Sodium: 136 mmol/L (ref 135–145)

## 2020-02-14 MED ORDER — SODIUM CHLORIDE 0.9 % IV BOLUS
1000.0000 mL | Freq: Once | INTRAVENOUS | Status: AC
  Administered 2020-02-14: 1000 mL via INTRAVENOUS

## 2020-02-14 MED ORDER — ONDANSETRON HCL 4 MG/2ML IJ SOLN
4.0000 mg | Freq: Once | INTRAMUSCULAR | Status: AC
  Administered 2020-02-14: 4 mg via INTRAVENOUS
  Filled 2020-02-14: qty 2

## 2020-02-14 MED ORDER — HYDROMORPHONE HCL 1 MG/ML IJ SOLN
1.0000 mg | Freq: Once | INTRAMUSCULAR | Status: AC
  Administered 2020-02-14: 1 mg via INTRAVENOUS
  Filled 2020-02-14: qty 1

## 2020-02-14 NOTE — ED Provider Notes (Signed)
[facility] EMERGENCY DEPARTMENT Provider Note   CSN: [id number] Arrival date & time: 02/14/20  1555     History Chief Complaint  Patient presents with  . Flank Pain    Joshua Thomas is a 45 y.o. 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  He reports right sided flank pain over the past 2 weeks, now with urinary retention, decreased urinary stream and a pressure sensation in his penis while urinating.  He denies fevers, chills, n/v, endorses bilateral kidney pain.  He feels like his bladder is overly full. Denies hematuria, no penile dc.  He has found no alleviators for his symptoms.   HPI     Past Medical History:  Diagnosis Date  . ADHD (attention deficit hyperactivity disorder)   . Bipolar 1 disorder (HCC)   . Chronic back pain   . Chronic pain    chronic l leg pain  . 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 COPD (chronic obstructive pulmonary disease) (HCC)   . DDD (degenerative disc disease), lumbar   . Degenerative disc disease, lumbar   . Depression   . Discitis   . GERD (gastroesophageal reflux disease)   . Hepatitis C   . Hepatitis C   . History of stomach ulcers   . Hx of diabetes mellitus    due to infection  . Hypertension   . Lung nodules    3 on L and 2 on R  . PTSD (post-traumatic stress disorder)   . Sciatica   . Substance abuse [facility])     Patient Active Problem List   Diagnosis Date Noted  . Chronic low back pain 09/23/2017  . Essential hypertension 09/23/2017  . Hyperglycemia 06/03/2017  . GERD (gastroesophageal reflux disease) 05/17/2017  . Bipolar disorder (HCC) 05/17/2017  . Opioid use disorder, moderate, dependence (HCC) 05/23/2015  . Chronic hepatitis C virus  genotype 1a infection (HCC)     Past Surgical History:  Procedure Laterality Date  . FRACTURE SURGERY    . HARDWARE REMOVAL  01/27/2012   Procedure: HARDWARE REMOVAL;  Surgeon: Kathryne Hitch, MD;  Location: [facility];  Service: Orthopedics;  Laterality: Left;  . IR LUMBAR DISC ASPIRATION W/IMG GUIDE  06/08/2017  . RADIOLOGY WITH ANESTHESIA N/A 06/08/2017   Procedure: DISC ASPIRATION;  Surgeon: Julieanne Cotton, MD;  Location: [facility] OR;  Service: Radiology;  Laterality: N/A;  . TIBIA IM NAIL INSERTION  01/27/2012   Procedure: INTRAMEDULLARY (IM) NAIL TIBIAL;  Surgeon: Kathryne Hitch, MD;  Location: [facility];  Service: Orthopedics;  Laterality: Left;  Removal of IM Rod and Screws Left Tibia with Exchange Nail, Allograft Bone Graft Left Tibia       Family History  Problem Relation Age of Onset  . Diabetes Mother   . Alcohol abuse Father   . Cirrhosis Father   . Aneurysm Father     Social History   Tobacco Use  . Smoking status: Current Every Day Smoker    Packs/day: 1.00    Years: 33.00    Pack years: 33.00    Types: Cigarettes  . Smokeless tobacco: Former Neurosurgeon    Quit date: 08/08/2006  . Tobacco comment: 1 PPD  Vaping Use  .  Vaping Use: Some days  Substance Use Topics  . Alcohol use: Yes    Comment: occasionally  . Drug use: Yes    Types: Cocaine, IV, Marijuana, Methamphetamines    Comment: opiates-  none april 2021    Home Medications Prior to Admission medications   Medication Sig Start Date End Date Taking? Authorizing Provider  Aspirin-Acetaminophen-Caffeine (GOODY HEADACHE PO) Take by mouth.    [provider]  ibuprofen (ADVIL) 200 MG tablet Take 200 mg by mouth every 6 (six) hours as needed.    [provider]  traZODone (DESYREL) 100 MG tablet Take 100 mg by mouth at bedtime.    [provider]    Allergies    Patient has no known allergies.  Review of Systems   Review of Systems  Constitutional: Negative for chills and  fever.  HENT: Negative for congestion and sore throat.   Eyes: Negative.   Respiratory: Negative for chest tightness and shortness of breath.   Cardiovascular: Negative for chest pain.  Gastrointestinal: Negative for abdominal pain, nausea and vomiting.  Genitourinary: Positive for decreased urine volume, difficulty urinating, flank pain, frequency, penile pain and urgency. Negative for penile discharge.  Musculoskeletal: Negative for arthralgias, joint swelling and neck pain.  Skin: Negative.  Negative for rash and wound.  Neurological: Negative for dizziness, weakness, light-headedness, numbness and headaches.  Psychiatric/Behavioral: Negative.   All other systems reviewed and are negative.   Physical Exam Updated Vital Signs BP 140/90 (BP Location: Right Arm)   Pulse (!) 110   Temp 97.8 F (36.6 C)   Resp 18   SpO2 100%   Physical Exam Vitals and nursing note reviewed. Exam conducted with a chaperone present.  Constitutional:      Appearance: He is well-developed and well-nourished.  HENT:     Head: Normocephalic and atraumatic.  Eyes:     Conjunctiva/sclera: Conjunctivae normal.  Cardiovascular:     Rate and Rhythm: Normal rate and regular rhythm.     Pulses: Intact distal pulses.     Heart sounds: Normal heart sounds.  Pulmonary:     Effort: Pulmonary effort is normal.     Breath sounds: Normal breath sounds. No wheezing.  Abdominal:     General: Bowel sounds are normal. There is no distension.     Palpations: Abdomen is soft.     Tenderness: There is no abdominal tenderness. There is right CVA tenderness.  Genitourinary:    Penis: Normal and circumcised. No discharge.      Testes: Normal.     Comments: No palpable urethral stone. Musculoskeletal:        General: Normal range of motion.     Cervical back: Normal range of motion.  Skin:    General: Skin is warm and dry.  Neurological:     Mental Status: He is alert.  Psychiatric:        Mood and Affect: Mood  and affect normal.     ED Results / Procedures / Treatments   Labs (all labs ordered are listed, but only abnormal results are displayed) Labs Reviewed  CBC WITH DIFFERENTIAL/PLATELET - Abnormal; Notable for the following components:      Result Value   Platelets 131 (*)    All other components within normal limits  BASIC METABOLIC PANEL - Abnormal; Notable for the following components:   Glucose, Bld 169 (*)    All other components within normal limits  URINALYSIS, ROUTINE W REFLEX MICROSCOPIC    EKG None  Radiology CT Renal Stone Study  Result Date: 02/14/2020 CLINICAL DATA:  Flank pain, kidney stone suspected. Patient also has penile discomfort and history of retained stones in penis. EXAM: CT ABDOMEN AND PELVIS WITHOUT CONTRAST TECHNIQUE: Multidetector CT imaging of the abdomen and pelvis was performed following the standard protocol without IV contrast. COMPARISON:  CT abdomen pelvis November 09, 2019 FINDINGS: Lower chest: Decreased size of the nodular opacity in the anterior left lung base which measures 1.3 cm previously 1.5 cm which is been present since March 17, 2017 and given its greater than 2 years of relative stability is favored benign. Hepatobiliary: Unremarkable noncontrast appearance of the hepatic parenchyma. Gallbladder is unremarkable. No biliary ductal dilatation. Pancreas: Unremarkable Spleen: Again seen are 2 metallic densities in the spleen at the hilum. Otherwise unremarkable. Adrenals/Urinary Tract: Bilateral adrenal glands appear unremarkable. Stable size of 1.1 cm left upper pole hypodense renal lesion which is technically incompletely characterized but favored represent cysts. Urinary bladder is unremarkable. Stomach/Bowel: Stomach is unremarkable. Gas and fluid seen in non dilated proximal small bowel. No suspicious small bowel wall thickening or dilation. Once again a noninflamed partially gas-filled appendix is seen in the right lower quadrant extending into a  right inguinal hernia (series 2 image 68). Small volume of formed stool throughout the colon. Vascular/Lymphatic: Aortic atherosclerosis. No enlarged abdominal or pelvic lymph nodes. Reproductive: Prostatic calcifications otherwise unremarkable. Other: No abdominopelvic ascites. Musculoskeletal: No acute or significant osseous findings. Again visualized is multilevel degenerative changes most notable at L2-L3 with significant endplate sclerosis and irregularity. Similar slight L1 vertebral body compression deformity with less than 25% height loss. IMPRESSION: 1. Gas and fluid visualized and proximal loops of small bowel suggestive of enteritis. 2. No nephrolithiasis or hydronephrosis. 3. Noninflamed partially gas-filled appendix is again seen in the right lower quadrant extending into a right inguinal hernia. 4. Aortic atherosclerosis. Aortic Atherosclerosis (ICD10-I70.0). Electronically Signed   By: Maudry Mayhew MD   On: 02/14/2020 17:48    Procedures Procedures   Medications Ordered in ED Medications  ondansetron [facility]) injection 4 mg (4 mg Intravenous Given 02/14/20 1720)  HYDROmorphone (DILAUDID) injection 1 mg (1 mg Intravenous Given 02/14/20 1720)  sodium chloride 0.9 % bolus 1,000 mL (1,000 mLs Intravenous New Bag/Given 02/14/20 1832)    ED Course  I have reviewed the triage vital signs and the nursing notes.  Pertinent labs & imaging results that were available during my care of the patient were reviewed by me and considered in my medical decision making (see chart for details).    MDM Rules/Calculators/A&P                          Bladder scan completed with 342 cc urine.  CT imaging negative for urinary tract stone, but suggestive of possible enteritis, sx however not suggestive of this diagnosis. No vomiting, fever or diarrhea.    Pt has been able to urinate, pending UA at this time.    Discussed with Tammy Triplett PA-C who will dispo pt once UA resulted. Uroology referral, +?- abx  pending UA results. Final Clinical Impression(s) / ED Diagnoses Final diagnoses:  Flank pain  Dysuria    Rx / DC Orders ED Discharge Orders    None       Victoriano Lain 02/14/20 1911    Mancel Bale, MD 02/18/20 [phone]

## 2020-02-14 NOTE — ED Notes (Signed)
Pt. Denied having this nurse go over his D/C paperwork.

## 2020-02-14 NOTE — ED Triage Notes (Signed)
Brought in via ems, pain in right flank area

## 2020-02-14 NOTE — ED Notes (Signed)
Pt requesting to leave AMA. EDP made aware.

## 2020-02-14 NOTE — ED Provider Notes (Signed)
   Patient signed out to me by Burgess Amor, PA-C pending urinalysis.    Patient here with symptoms of dysuria and flank pain.  History kidney stones.  Urinalysis without evidence of infection.  He does have glucosuria.  Blood sugar 169 no history of diabetes but does have family history.   Labs Reviewed  CBC WITH DIFFERENTIAL/PLATELET - Abnormal; Notable for the following components:      Result Value   Platelets 131 (*)    All other components within normal limits  BASIC METABOLIC PANEL - Abnormal; Notable for the following components:   Glucose, Bld 169 (*)    All other components within normal limits  URINALYSIS, ROUTINE W REFLEX MICROSCOPIC - Abnormal; Notable for the following components:   Glucose, UA >=500 (*)    Protein, ur 30 (*)    All other components within normal limits     Patient appears appropriate for discharge home, he has been given referral information for urology.  He is requested resource list for primary care.  I have discussed urinalysis findings and recommended he contact someone to establish primary care and that he will likely need A1c.  He verbalized understanding agrees to plan.   Pauline Aus, PA-C 02/14/20 1949    Mancel Bale, MD 02/18/20 [phone]

## 2020-02-14 NOTE — Discharge Instructions (Addendum)
Your lab tests and CT imaging are negative for kidney stone or other source of your pain symptoms.  Follow up with Dr. Ronne Binning (urologist) if your symptoms persist.  I have also listed several referral options for you to establish primary care.  You had some glucose (sugar) in your urine today.  If you have a family history of diabetes, you will need to see a primary care provider to have this rechecked.

## 2020-08-07 ENCOUNTER — Other Ambulatory Visit: Payer: Self-pay

## 2020-08-07 ENCOUNTER — Emergency Department (HOSPITAL_COMMUNITY)
Admission: EM | Admit: 2020-08-07 | Discharge: 2020-08-07 | Disposition: A | Discharge status: Home / Self Care | Payer: Self-pay | Attending: Emergency Medicine | Admitting: Emergency Medicine

## 2020-08-07 ENCOUNTER — Encounter (HOSPITAL_COMMUNITY): Payer: Self-pay | Admitting: *Deleted

## 2020-08-07 DIAGNOSIS — J449 Chronic obstructive pulmonary disease, unspecified: Secondary | ICD-10-CM | POA: Insufficient documentation

## 2020-08-07 DIAGNOSIS — I10 Essential (primary) hypertension: Secondary | ICD-10-CM | POA: Insufficient documentation

## 2020-08-07 DIAGNOSIS — F17210 Nicotine dependence, cigarettes, uncomplicated: Secondary | ICD-10-CM | POA: Insufficient documentation

## 2020-08-07 DIAGNOSIS — T401X1A Poisoning by heroin, accidental (unintentional), initial encounter: Secondary | ICD-10-CM | POA: Insufficient documentation

## 2020-08-07 DIAGNOSIS — E119 Type 2 diabetes mellitus without complications: Secondary | ICD-10-CM | POA: Insufficient documentation

## 2020-08-07 DIAGNOSIS — T50901A Poisoning by unspecified drugs, medicaments and biological substances, accidental (unintentional), initial encounter: Secondary | ICD-10-CM

## 2020-08-07 DIAGNOSIS — R220 Localized swelling, mass and lump, head: Secondary | ICD-10-CM | POA: Insufficient documentation

## 2020-08-07 MED ORDER — NALOXONE HCL 4 MG/0.1ML NA LIQD: NASAL | 0 refills | Status: AC

## 2020-08-07 MED ORDER — DOXYCYCLINE HYCLATE 100 MG PO CAPS: 100.0000 mg | ORAL_CAPSULE | Freq: Every day | ORAL | 0 refills | Status: DC

## 2020-08-07 MED ORDER — DOXYCYCLINE HYCLATE 100 MG PO TABS
100.0000 mg | ORAL_TABLET | Freq: Once | ORAL | Status: AC
  Administered 2020-08-07: 100 mg via ORAL
  Filled 2020-08-07: qty 1

## 2020-08-07 NOTE — ED Notes (Signed)
Pt given ginger ale.

## 2020-08-07 NOTE — Discharge Instructions (Signed)
Please take the antibiotic as directed. Keep the Narcan on you in case you or someone else finds you to be experiencing an overdose and can spray this as directed in your nose to reverse the overdose. If you use this, call 911. I am also starting you on antibiotic for your scalp. Take this as directed and call your PCP for an appointment in the coming week.

## 2020-08-07 NOTE — ED Triage Notes (Signed)
Pt brought in by [facility] for overdose on heroin; pt was at someone's house and law enforcement was called due to pt not breathing; when police arrived pt was cyanotic and was given narcan 4mg  intranasally by police, when ems arrived pt was given narcan 2mg  IV; pt became more alert with ems en route to hospital;

## 2020-08-07 NOTE — ED Provider Notes (Signed)
Emergency Department Provider Note   I have reviewed the triage vital signs and the nursing notes.   HISTORY  Chief Complaint Drug Overdose   HPI Joshua Thomas is a 45 y.o. male with past medical history reviewed below presents to the emergency department with unintentional overdose.  Patient tells me that he has been awake for many days and did use heroin but is hesitant to tell me exactly where he was using or the route of administration. I explained that our discussion is confidential.  He did endorse using heroin and was apparently found cyanotic and unresponsive.  According to EMS the police were first on scene and administered 4 mg of IN Narcan with some improvement.  EMS administered an additional 2 mg IV when they arrived and patient has become much more awake and alert.  He denies any suicidal or homicidal ideation.  He is not particularly interested in recovery or treatment at this time.  He does not keep Narcan on himself.   He states that he also would like his scalp evaluated.  He has noticed several painful bumps on the back, left of the scalp which have been present for several weeks.  No bumps or abscess on additional areas of the body.  No fevers.  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   COPD (chronic obstructive pulmonary disease) (HCC)    DDD (degenerative disc disease), lumbar    Degenerative disc disease, lumbar    Depression    Discitis    GERD (gastroesophageal reflux disease)    Hepatitis C    Hepatitis C    History of stomach ulcers    Hx of diabetes mellitus    due to infection   Hypertension    Lung nodules    3 on L and 2 on R   PTSD (post-traumatic stress disorder)    Sciatica    Substance abuse [facility])     Patient Active Problem List    Diagnosis Date Noted   MDD (major depressive disorder), recurrent, severe, with psychosis (HCC) 08/10/2020   Chronic low back pain 09/23/2017   Essential hypertension 09/23/2017   Hyperglycemia 06/03/2017   GERD (gastroesophageal reflux disease) 05/17/2017   Bipolar disorder (HCC) 05/17/2017   Opioid use disorder, moderate, dependence (HCC) 05/23/2015   Chronic hepatitis C virus genotype 1a infection (HCC)     Past Surgical History:  Procedure Laterality Date   FRACTURE SURGERY     HARDWARE REMOVAL  01/27/2012   Procedure: HARDWARE REMOVAL;  Surgeon: Kathryne Hitch, MD;  Location: [facility];  Service: Orthopedics;  Laterality: Left;   IR LUMBAR DISC ASPIRATION W/IMG GUIDE  06/08/2017   RADIOLOGY WITH ANESTHESIA N/A 06/08/2017   Procedure: DISC ASPIRATION;  Surgeon: Julieanne Cotton, MD;  Location: [facility] OR;  Service: Radiology;  Laterality: N/A;   TIBIA IM NAIL INSERTION  01/27/2012   Procedure: INTRAMEDULLARY (IM) NAIL TIBIAL;  Surgeon: Kathryne Hitch, MD;  Location: [facility];  Service: Orthopedics;  Laterality: Left;  Removal of IM Rod and Screws Left Tibia with Exchange Nail, Allograft Bone Graft Left Tibia    Allergies Patient has no known allergies.  Family History  Problem Relation Age of Onset   Diabetes Mother  Alcohol abuse Father    Cirrhosis Father    Aneurysm Father     Social History Social History   Tobacco Use   Smoking status: Every Day    Packs/day: 1.00    Years: 33.00    Pack years: 33.00    Types: Cigarettes   Smokeless tobacco: Former    Quit date: 08/08/2006   Tobacco comments:    1 PPD  Vaping Use   Vaping Use: Some days  Substance Use Topics   Alcohol use: Yes    Comment: occasionally   Drug use: Yes    Types: Cocaine, IV, Marijuana, Methamphetamines    Comment: opiates-  none april 2021    Review of Systems  Constitutional: No fever/chills Eyes: No visual changes. ENT: No sore throat. Cardiovascular: Denies chest  pain. Respiratory: Denies shortness of breath. Gastrointestinal: No abdominal pain.  No nausea, no vomiting.  No diarrhea.  No constipation. Genitourinary: Negative for dysuria. Musculoskeletal: Negative for back pain. Skin: Scalp pain and lesions.  Neurological: Negative for headaches, focal weakness or numbness.  10-point ROS otherwise negative.  ____________________________________________   PHYSICAL EXAM:  VITAL SIGNS: ED Triage Vitals  Enc Vitals Group     BP 08/07/20 1614 (!) 155/102     Pulse Rate 08/07/20 1614 (!) 114     Resp 08/07/20 1614 (!) 22     Temp 08/07/20 1614 98.3 F (36.8 C)     Temp Source 08/07/20 1614 Oral     SpO2 08/07/20 1614 93 %     Weight 08/07/20 1557 150 lb (68 kg)     Height 08/07/20 1557 5\' 3"  (1.6 m)   Constitutional: Alert and oriented.  Patient is fairly fidgety and shifting around in bed frequently. Eyes: Conjunctivae are normal. PERRL (3 mm).  Head: Atraumatic. 2-3 discrete approximately 1 to 2 cm areas of raised, tenderness in the posterior scalp.  No drainage.  No obvious cellulitis.  No open sores or crusting lesions.  Nose: No congestion/rhinnorhea. Mouth/Throat: Mucous membranes are moist.   Neck: No stridor.  Cardiovascular: Normal rate, regular rhythm. Good peripheral circulation. Grossly normal heart sounds.   Respiratory: Normal respiratory effort.  No retractions. Lungs CTAB. Gastrointestinal: Soft and nontender. No distention.  Musculoskeletal: No lower extremity tenderness nor edema. No gross deformities of extremities. Neurologic:  Normal speech and language. No gross focal neurologic deficits are appreciated.  Skin:  Skin is warm and dry. Multiple superficial skin lesions and dried scabs. No abscess noted.    ____________________________________________   LABS (all labs ordered are listed, but only abnormal results are displayed)  None ____________________________________________  EKG   EKG  Interpretation  Date/Time:  Thursday August 07 2020 16:14:26 EDT Ventricular Rate:  113 PR Interval:  142 QRS Duration: 112 QT Interval:  375 QTC Calculation: 515 R Axis:   46 Text Interpretation: Sinus tachycardia Incomplete right bundle branch block Anterior infarct, old  Similar to prior from 2020 Confirmed by 2021 [phone]) on 08/07/2020 4:20:07 PM        ____________________________________________  RADIOLOGY  None  ____________________________________________   PROCEDURES  Procedure(s) performed:   Procedures  None ____________________________________________   INITIAL IMPRESSION / ASSESSMENT AND PLAN / ED COURSE  Pertinent labs & imaging results that were available during my care of the patient were reviewed by me and considered in my medical decision making (see chart for details).   Patient presents to the emergency department with unintentional heroin overdose.  He is awake, alert, agreeable to  stay for monitoring after receiving Narcan.  He was given a package containing area drug rehabilitation resources.  Plan to send him with a prescription for Narcan as well.  EKG reviewed and interpreted as above.  Patient does have some painful lesions to the posterior scalp.  These do not seem consistent with zoster.  We will plan to start doxycycline for the next week with concern for possible developing skin infection there but does not appear severe or requiring I&D at this time.   Patient observed and sobering. Plan for outpatient referral and PCP follow up plan.  ____________________________________________  FINAL CLINICAL IMPRESSION(S) / ED DIAGNOSES  Final diagnoses:  Accidental drug overdose, initial encounter     MEDICATIONS GIVEN DURING THIS VISIT:  Medications  doxycycline (VIBRA-TABS) tablet 100 mg (100 mg Oral Given 08/07/20 1636)     NEW OUTPATIENT MEDICATIONS STARTED DURING THIS VISIT:  Discharge Medication List as of 08/07/2020  9:03 PM      START taking these medications   Details  doxycycline (VIBRAMYCIN) 100 MG capsule Take 1 capsule (100 mg total) by mouth daily for 7 days., Starting Thu 08/07/2020, Until Thu 08/14/2020, Print    naloxone [facility]) nasal spray 4 mg/0.1 mL Take in case of overdose, Print        Note:  This document was prepared using Dragon voice recognition software and may include unintentional dictation errors.  Alona Bene, MD, [facility] Emergency Medicine    Khairi Garman, Arlyss Repress, MD 08/11/20 1226

## 2020-08-08 ENCOUNTER — Encounter (HOSPITAL_COMMUNITY): Payer: Self-pay | Admitting: *Deleted

## 2020-08-08 ENCOUNTER — Emergency Department (HOSPITAL_COMMUNITY)
Admission: EM | Admit: 2020-08-08 | Discharge: 2020-08-08 | Disposition: A | Discharge status: Home / Self Care | Payer: Self-pay | Attending: Emergency Medicine | Admitting: Emergency Medicine

## 2020-08-08 ENCOUNTER — Other Ambulatory Visit: Payer: Self-pay

## 2020-08-08 ENCOUNTER — Encounter (HOSPITAL_COMMUNITY): Payer: Self-pay

## 2020-08-08 DIAGNOSIS — R45851 Suicidal ideations: Secondary | ICD-10-CM | POA: Insufficient documentation

## 2020-08-08 DIAGNOSIS — J449 Chronic obstructive pulmonary disease, unspecified: Secondary | ICD-10-CM | POA: Insufficient documentation

## 2020-08-08 DIAGNOSIS — Z046 Encounter for general psychiatric examination, requested by authority: Secondary | ICD-10-CM | POA: Insufficient documentation

## 2020-08-08 DIAGNOSIS — Z7982 Long term (current) use of aspirin: Secondary | ICD-10-CM | POA: Insufficient documentation

## 2020-08-08 DIAGNOSIS — F17210 Nicotine dependence, cigarettes, uncomplicated: Secondary | ICD-10-CM | POA: Insufficient documentation

## 2020-08-08 DIAGNOSIS — F1120 Opioid dependence, uncomplicated: Secondary | ICD-10-CM | POA: Insufficient documentation

## 2020-08-08 DIAGNOSIS — Z5321 Procedure and treatment not carried out due to patient leaving prior to being seen by health care provider: Secondary | ICD-10-CM | POA: Insufficient documentation

## 2020-08-08 DIAGNOSIS — Z20822 Contact with and (suspected) exposure to covid-19: Secondary | ICD-10-CM | POA: Insufficient documentation

## 2020-08-08 DIAGNOSIS — E119 Type 2 diabetes mellitus without complications: Secondary | ICD-10-CM | POA: Insufficient documentation

## 2020-08-08 DIAGNOSIS — I10 Essential (primary) hypertension: Secondary | ICD-10-CM | POA: Insufficient documentation

## 2020-08-08 LAB — CBC WITH DIFFERENTIAL/PLATELET
Abs Immature Granulocytes: 0.02 10*3/uL (ref 0.00–0.07)
Basophils Absolute: 0 10*3/uL (ref 0.0–0.1)
Basophils Relative: 0 %
Eosinophils Absolute: 0.3 10*3/uL (ref 0.0–0.5)
Eosinophils Relative: 5 %
HCT: 38.3 % — ABNORMAL LOW (ref 39.0–52.0)
Hemoglobin: 12.3 g/dL — ABNORMAL LOW (ref 13.0–17.0)
Immature Granulocytes: 0 %
Lymphocytes Relative: 40 %
Lymphs Abs: 2.5 10*3/uL (ref 0.7–4.0)
MCH: 30.9 pg (ref 26.0–34.0)
MCHC: 32.1 g/dL (ref 30.0–36.0)
MCV: 96.2 fL (ref 80.0–100.0)
Monocytes Absolute: 0.6 10*3/uL (ref 0.1–1.0)
Monocytes Relative: 10 %
Neutro Abs: 2.8 10*3/uL (ref 1.7–7.7)
Neutrophils Relative %: 45 %
Platelets: 127 10*3/uL — ABNORMAL LOW (ref 150–400)
RBC: 3.98 MIL/uL — ABNORMAL LOW (ref 4.22–5.81)
RDW: 13.3 % (ref 11.5–15.5)
WBC: 6.1 10*3/uL (ref 4.0–10.5)
nRBC: 0 % (ref 0.0–0.2)

## 2020-08-08 NOTE — ED Triage Notes (Signed)
Pt states he was here earlier stating he doesn't have much time left on this earth. Pt has difficulty sitting still during Triage reporting he hasn't begun going through detox yet from Heroin but knows he needs help and rehab. Pt reports he needs to go somewhere before he overdoses again and ends it all.

## 2020-08-08 NOTE — ED Notes (Signed)
Pt requests to deny he is here to any and all callers who inquire if he is present in [facility] (especially family members).

## 2020-08-08 NOTE — ED Triage Notes (Signed)
States he is suicidal

## 2020-08-09 ENCOUNTER — Emergency Department (HOSPITAL_COMMUNITY)
Admission: EM | Admit: 2020-08-09 | Discharge: 2020-08-09 | Disposition: A | Discharge status: Other Acute Inpatient Hospital | Payer: Self-pay | Attending: Emergency Medicine | Admitting: Emergency Medicine

## 2020-08-09 ENCOUNTER — Inpatient Hospital Stay (HOSPITAL_COMMUNITY)
Admission: RE | Admit: 2020-08-09 | Discharge: 2020-08-18 | DRG: 885 | Disposition: A | Discharge status: Rehabilitation Facility | Payer: Federal, State, Local not specified - Other | Source: Intra-hospital | Attending: Emergency Medicine | Admitting: Emergency Medicine

## 2020-08-09 DIAGNOSIS — T40411A Poisoning by fentanyl or fentanyl analogs, accidental (unintentional), initial encounter: Secondary | ICD-10-CM | POA: Diagnosis present

## 2020-08-09 DIAGNOSIS — M5136 Other intervertebral disc degeneration, lumbar region: Secondary | ICD-10-CM | POA: Diagnosis present

## 2020-08-09 DIAGNOSIS — F1510 Other stimulant abuse, uncomplicated: Secondary | ICD-10-CM | POA: Diagnosis present

## 2020-08-09 DIAGNOSIS — B1920 Unspecified viral hepatitis C without hepatic coma: Secondary | ICD-10-CM | POA: Diagnosis present

## 2020-08-09 DIAGNOSIS — F332 Major depressive disorder, recurrent severe without psychotic features: Secondary | ICD-10-CM | POA: Diagnosis present

## 2020-08-09 DIAGNOSIS — F1310 Sedative, hypnotic or anxiolytic abuse, uncomplicated: Secondary | ICD-10-CM | POA: Diagnosis present

## 2020-08-09 DIAGNOSIS — F17210 Nicotine dependence, cigarettes, uncomplicated: Secondary | ICD-10-CM | POA: Diagnosis present

## 2020-08-09 DIAGNOSIS — T40602A Poisoning by unspecified narcotics, intentional self-harm, initial encounter: Secondary | ICD-10-CM | POA: Diagnosis present

## 2020-08-09 DIAGNOSIS — G2581 Restless legs syndrome: Secondary | ICD-10-CM | POA: Diagnosis present

## 2020-08-09 DIAGNOSIS — T424X2A Poisoning by benzodiazepines, intentional self-harm, initial encounter: Secondary | ICD-10-CM | POA: Diagnosis present

## 2020-08-09 DIAGNOSIS — F1110 Opioid abuse, uncomplicated: Secondary | ICD-10-CM | POA: Diagnosis present

## 2020-08-09 DIAGNOSIS — F4310 Post-traumatic stress disorder, unspecified: Secondary | ICD-10-CM | POA: Diagnosis present

## 2020-08-09 DIAGNOSIS — F1190 Opioid use, unspecified, uncomplicated: Secondary | ICD-10-CM | POA: Diagnosis present

## 2020-08-09 DIAGNOSIS — F1123 Opioid dependence with withdrawal: Secondary | ICD-10-CM | POA: Diagnosis present

## 2020-08-09 DIAGNOSIS — I10 Essential (primary) hypertension: Secondary | ICD-10-CM | POA: Diagnosis present

## 2020-08-09 DIAGNOSIS — E119 Type 2 diabetes mellitus without complications: Secondary | ICD-10-CM | POA: Diagnosis present

## 2020-08-09 DIAGNOSIS — G8929 Other chronic pain: Secondary | ICD-10-CM | POA: Diagnosis present

## 2020-08-09 DIAGNOSIS — R45851 Suicidal ideations: Secondary | ICD-10-CM

## 2020-08-09 DIAGNOSIS — F1410 Cocaine abuse, uncomplicated: Secondary | ICD-10-CM | POA: Diagnosis present

## 2020-08-09 DIAGNOSIS — T401X1A Poisoning by heroin, accidental (unintentional), initial encounter: Secondary | ICD-10-CM | POA: Diagnosis present

## 2020-08-09 DIAGNOSIS — G4700 Insomnia, unspecified: Secondary | ICD-10-CM | POA: Diagnosis present

## 2020-08-09 DIAGNOSIS — K5900 Constipation, unspecified: Secondary | ICD-10-CM | POA: Diagnosis present

## 2020-08-09 DIAGNOSIS — F1994 Other psychoactive substance use, unspecified with psychoactive substance-induced mood disorder: Secondary | ICD-10-CM | POA: Diagnosis present

## 2020-08-09 DIAGNOSIS — Z9151 Personal history of suicidal behavior: Secondary | ICD-10-CM

## 2020-08-09 DIAGNOSIS — Z8711 Personal history of peptic ulcer disease: Secondary | ICD-10-CM

## 2020-08-09 DIAGNOSIS — Z833 Family history of diabetes mellitus: Secondary | ICD-10-CM

## 2020-08-09 DIAGNOSIS — F909 Attention-deficit hyperactivity disorder, unspecified type: Secondary | ICD-10-CM | POA: Diagnosis present

## 2020-08-09 DIAGNOSIS — R230 Cyanosis: Secondary | ICD-10-CM | POA: Diagnosis present

## 2020-08-09 DIAGNOSIS — L0212 Furuncle of neck: Secondary | ICD-10-CM | POA: Diagnosis present

## 2020-08-09 DIAGNOSIS — T402X1A Poisoning by other opioids, accidental (unintentional), initial encounter: Secondary | ICD-10-CM | POA: Diagnosis present

## 2020-08-09 DIAGNOSIS — Z811 Family history of alcohol abuse and dependence: Secondary | ICD-10-CM

## 2020-08-09 DIAGNOSIS — Z79899 Other long term (current) drug therapy: Secondary | ICD-10-CM

## 2020-08-09 DIAGNOSIS — Z20822 Contact with and (suspected) exposure to covid-19: Secondary | ICD-10-CM | POA: Diagnosis present

## 2020-08-09 DIAGNOSIS — L739 Follicular disorder, unspecified: Secondary | ICD-10-CM

## 2020-08-09 DIAGNOSIS — K219 Gastro-esophageal reflux disease without esophagitis: Secondary | ICD-10-CM | POA: Diagnosis present

## 2020-08-09 DIAGNOSIS — F1910 Other psychoactive substance abuse, uncomplicated: Secondary | ICD-10-CM | POA: Diagnosis present

## 2020-08-09 DIAGNOSIS — J449 Chronic obstructive pulmonary disease, unspecified: Secondary | ICD-10-CM | POA: Diagnosis present

## 2020-08-09 DIAGNOSIS — F333 Major depressive disorder, recurrent, severe with psychotic symptoms: Secondary | ICD-10-CM | POA: Diagnosis present

## 2020-08-09 DIAGNOSIS — F1124 Opioid dependence with opioid-induced mood disorder: Secondary | ICD-10-CM | POA: Diagnosis not present

## 2020-08-09 LAB — BASIC METABOLIC PANEL
Anion gap: 5 (ref 5–15)
BUN: 13 mg/dL (ref 6–20)
CO2: 27 mmol/L (ref 22–32)
Calcium: 8.3 mg/dL — ABNORMAL LOW (ref 8.9–10.3)
Chloride: 100 mmol/L (ref 98–111)
Creatinine, Ser: 0.84 mg/dL (ref 0.61–1.24)
GFR, Estimated: >60 mL/min (ref >60)
Glucose, Bld: 124 mg/dL — ABNORMAL HIGH (ref 70–99)
Potassium: 3.1 mmol/L — ABNORMAL LOW (ref 3.5–5.1)
Sodium: 132 mmol/L — ABNORMAL LOW (ref 135–145)

## 2020-08-09 LAB — RAPID URINE DRUG SCREEN, HOSP PERFORMED
Amphetamines: NOT DETECTED
Barbiturates: NOT DETECTED
Benzodiazepines: NOT DETECTED
Cocaine: NOT DETECTED
Opiates: NOT DETECTED
Tetrahydrocannabinol: NOT DETECTED

## 2020-08-09 LAB — RESP PANEL BY RT-PCR (FLU A&B, COVID) ARPGX2
Influenza A by PCR: NEGATIVE
Influenza B by PCR: NEGATIVE
SARS Coronavirus 2 by RT PCR: NEGATIVE

## 2020-08-09 LAB — ACETAMINOPHEN LEVEL: Acetaminophen (Tylenol), Serum: <10 ug/mL — ABNORMAL LOW (ref 10–30)

## 2020-08-09 LAB — SALICYLATE LEVEL: Salicylate Lvl: <7 mg/dL — ABNORMAL LOW (ref 7.0–30.0)

## 2020-08-09 MED ORDER — DICYCLOMINE HCL 10 MG PO CAPS: 20.0000 mg | ORAL_CAPSULE | Freq: Four times a day (QID) | ORAL | Status: DC | PRN

## 2020-08-09 MED ORDER — NAPROXEN 250 MG PO TABS: 500.0000 mg | ORAL_TABLET | Freq: Two times a day (BID) | ORAL | Status: DC | PRN

## 2020-08-09 MED ORDER — LOPERAMIDE HCL 2 MG PO CAPS: 2.0000 mg | ORAL_CAPSULE | ORAL | Status: DC | PRN

## 2020-08-09 MED ORDER — HYDROXYZINE HCL 25 MG PO TABS
25.0000 mg | ORAL_TABLET | Freq: Four times a day (QID) | ORAL | Status: DC | PRN
  Administered 2020-08-09: 25 mg via ORAL
  Filled 2020-08-09: qty 1

## 2020-08-09 MED ORDER — METHOCARBAMOL 500 MG PO TABS: 500.0000 mg | ORAL_TABLET | Freq: Three times a day (TID) | ORAL | Status: DC | PRN

## 2020-08-09 MED ORDER — CLONIDINE HCL 0.1 MG PO TABS: 0.1000 mg | ORAL_TABLET | Freq: Every day | ORAL | Status: DC

## 2020-08-09 MED ORDER — ONDANSETRON 4 MG PO TBDP: 4.0000 mg | ORAL_TABLET | Freq: Four times a day (QID) | ORAL | Status: DC | PRN

## 2020-08-09 MED ORDER — CLONIDINE HCL 0.1 MG PO TABS
0.1000 mg | ORAL_TABLET | Freq: Four times a day (QID) | ORAL | Status: DC
  Administered 2020-08-09 (×2): 0.1 mg via ORAL
  Filled 2020-08-09 (×2): qty 1

## 2020-08-09 MED ORDER — CLONIDINE HCL 0.1 MG PO TABS: 0.1000 mg | ORAL_TABLET | Freq: Two times a day (BID) | ORAL | Status: DC

## 2020-08-09 MED ORDER — FLUOXETINE HCL 10 MG PO CAPS
10.0000 mg | ORAL_CAPSULE | Freq: Every day | ORAL | Status: DC
Start: 2020-08-09 — End: 2020-08-09
  Administered 2020-08-09: 10 mg via ORAL
  Filled 2020-08-09: qty 1
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
## 2020-08-09 NOTE — ED Provider Notes (Signed)
[facility] EMERGENCY DEPARTMENT Provider Note   CSN: [id number] Arrival date & time: 08/08/20  1844     History Chief Complaint  Patient presents with   Medical Clearance    Joshua Thomas is a 45 y.o. male.  Patient presents to the emergency department stating that he thinks he is going to overdose and kill himself with her on heroin.  He is vague whether or not he has an actual intent to do this or is just concerned.  He did overdose yesterday which was felt to be unintentional.      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   COPD (chronic obstructive pulmonary disease) (HCC)    DDD (degenerative disc disease), lumbar    Degenerative disc disease, lumbar    Depression    Discitis    GERD (gastroesophageal reflux disease)    Hepatitis C    Hepatitis C    History of stomach ulcers    Hx of diabetes mellitus    due to infection   Hypertension    Lung nodules    3 on L and 2 on R   PTSD (post-traumatic stress disorder)    Sciatica    Substance abuse (HCC)     Patient Active Problem List   Diagnosis Date Noted   Chronic low back pain 09/23/2017   Essential hypertension 09/23/2017   Hyperglycemia 06/03/2017   GERD (gastroesophageal reflux disease) 05/17/2017   Bipolar disorder (HCC) 05/17/2017   Opioid use disorder, moderate, dependence (HCC) 05/23/2015   Chronic hepatitis C virus genotype 1a infection (HCC)     Past Surgical History:  Procedure Laterality Date   FRACTURE SURGERY     HARDWARE REMOVAL  01/27/2012   Procedure: HARDWARE REMOVAL;  Surgeon: Kathryne Hitch, MD;  Location: [facility];  Service: Orthopedics;  Laterality: Left;   IR LUMBAR DISC ASPIRATION W/IMG GUIDE  06/08/2017   RADIOLOGY WITH ANESTHESIA N/A 06/08/2017   Procedure: DISC ASPIRATION;   Surgeon: Julieanne Cotton, MD;  Location: [facility] OR;  Service: Radiology;  Laterality: N/A;   TIBIA IM NAIL INSERTION  01/27/2012   Procedure: INTRAMEDULLARY (IM) NAIL TIBIAL;  Surgeon: Kathryne Hitch, MD;  Location: [facility];  Service: Orthopedics;  Laterality: Left;  Removal of IM Rod and Screws Left Tibia with Exchange Nail, Allograft Bone Graft Left Tibia       Family History  Problem Relation Age of Onset   Diabetes Mother    Alcohol abuse Father    Cirrhosis Father    Aneurysm Father     Social History   Tobacco Use   Smoking status: Every Day    Packs/day: 1.00    Years: 33.00    Pack years: 33.00    Types: Cigarettes   Smokeless tobacco: Former    Quit date: 08/08/2006   Tobacco comments:    1 PPD  Vaping Use   Vaping Use: Some days  Substance Use Topics   Alcohol use: Yes    Comment: occasionally   Drug use: Yes    Types: Cocaine, IV, Marijuana, Methamphetamines    Comment: opiates-  none april 2021    Home Medications Prior to Admission medications   Medication Sig Start Date End Date Taking? Authorizing Provider  Aspirin-Acetaminophen-Caffeine (GOODY HEADACHE PO) Take by mouth.    [provider]  doxycycline (VIBRAMYCIN) 100 MG capsule Take 1 capsule (100 mg total) by mouth daily for 7 days. 08/07/20 08/14/20  Long, Arlyss Repress, MD  ibuprofen (ADVIL) 200 MG tablet Take 200 mg by mouth every 6 (six) hours as needed.    [provider]  naloxone [facility]) nasal spray 4 mg/0.1 mL Take in case of overdose 08/07/20   Long, Arlyss Repress, MD  traZODone (DESYREL) 100 MG tablet Take 100 mg by mouth at bedtime. Patient not taking: No sig reported    [provider]    Allergies    Patient has no known allergies.  Review of Systems   Review of Systems  Psychiatric/Behavioral:  Positive for dysphoric mood and suicidal ideas.   All other systems reviewed and are negative.  Physical Exam Updated Vital Signs BP (!) 153/96 (BP Location: Right  Arm)   Pulse 75   Temp 98.8 F (37.1 C) (Oral)   Resp 20   Ht 5\' 3"  (1.6 m)   Wt 68 kg   SpO2 98%   BMI 26.57 kg/m   Physical Exam Vitals and nursing note reviewed.  Constitutional:      General: He is not in acute distress.    Appearance: Normal appearance. He is well-developed.  HENT:     Head: Normocephalic and atraumatic.     Right Ear: Hearing normal.     Left Ear: Hearing normal.     Nose: Nose normal.  Eyes:     Conjunctiva/sclera: Conjunctivae normal.     Pupils: Pupils are equal, round, and reactive to light.  Cardiovascular:     Rate and Rhythm: Regular rhythm.     Heart sounds: S1 normal and S2 normal. No murmur heard.   No friction rub. No gallop.  Pulmonary:     Effort: Pulmonary effort is normal. No respiratory distress.     Breath sounds: Normal breath sounds.  Chest:     Chest wall: No tenderness.  Abdominal:     General: Bowel sounds are normal.     Palpations: Abdomen is soft.     Tenderness: There is no abdominal tenderness. There is no guarding or rebound. Negative signs include Murphy's sign and McBurney's sign.     Hernia: No hernia is present.  Musculoskeletal:        General: Normal range of motion.     Cervical back: Normal range of motion and neck supple.  Skin:    General: Skin is warm and dry.     Findings: No rash.  Neurological:     Mental Status: He is alert and oriented to person, place, and time.     GCS: GCS eye subscore is 4. GCS verbal subscore is 5. GCS motor subscore is 6.     Cranial Nerves: No cranial nerve deficit.     Sensory: No sensory deficit.     Coordination: Coordination normal.  Psychiatric:        Speech: Speech normal.        Behavior: Behavior normal.        Thought Content: Thought content normal.    ED Results / Procedures / Treatments   Labs (all labs ordered are listed, but only abnormal results are displayed) Labs Reviewed  ACETAMINOPHEN LEVEL - Abnormal; Notable for the following components:       Result Value   Acetaminophen (Tylenol), Serum <10 (*)    All other components within  normal limits  SALICYLATE LEVEL - Abnormal; Notable for the following components:   Salicylate Lvl <7.0 (*)    All other components within normal limits  BASIC METABOLIC PANEL - Abnormal; Notable for the following components:   Sodium 132 (*)    Potassium 3.1 (*)    Glucose, Bld 124 (*)    Calcium 8.3 (*)    All other components within normal limits  CBC WITH DIFFERENTIAL/PLATELET - Abnormal; Notable for the following components:   RBC 3.98 (*)    Hemoglobin 12.3 (*)    HCT 38.3 (*)    Platelets 127 (*)    All other components within normal limits  RESP PANEL BY RT-PCR (FLU A&B, COVID) ARPGX2  RAPID URINE DRUG SCREEN, HOSP PERFORMED    EKG None  Radiology No results found.  Procedures Procedures   Medications Ordered in ED Medications  cloNIDine (CATAPRES) tablet 0.1 mg (has no administration in time range)    Followed by  cloNIDine (CATAPRES) tablet 0.1 mg (has no administration in time range)    Followed by  cloNIDine (CATAPRES) tablet 0.1 mg (has no administration in time range)  dicyclomine (BENTYL) tablet 20 mg (has no administration in time range)  hydrOXYzine (ATARAX/VISTARIL) tablet 25 mg (has no administration in time range)  loperamide (IMODIUM) capsule 2-4 mg (has no administration in time range)  methocarbamol (ROBAXIN) tablet 500 mg (has no administration in time range)  naproxen (NAPROSYN) tablet 500 mg (has no administration in time range)  ondansetron (ZOFRAN-ODT) disintegrating tablet 4 mg (has no administration in time range)    ED Course  I have reviewed the triage vital signs and the nursing notes.  Pertinent labs & imaging results that were available during my care of the patient were reviewed by me and considered in my medical decision making (see chart for details).    MDM Rules/Calculators/A&P                           Presents with concerns over for  heroin abuse.  He thinks he might overdose.  He is currently having some early symptoms of withdrawal.  Will place on clonidine detox protocol, TTS consultation to see if he requires inpatient treatment.  Final Clinical Impression(s) / ED Diagnoses Final diagnoses:  Suicidal ideation  Heroin abuse (HCC)    Rx / DC Orders ED Discharge Orders     None        Bryli Mantey, Canary Brim, MD 08/09/20 [phone]

## 2020-08-09 NOTE — ED Notes (Signed)
Pt moved to Room 14 for TTS Consult. TTS Cart at bedside.

## 2020-08-09 NOTE — Progress Notes (Signed)
Awaiting Covid test so patient can be considered for admission to [facility].  Rosey Bath, RN

## 2020-08-09 NOTE — ED Notes (Signed)
Patient is sleeping on stretcher. No distress noted. Respirations are even and unlabored. Safety sitter at bedside.

## 2020-08-09 NOTE — Progress Notes (Signed)
Joshua Thomas is a 45 y.o. male voluntarily admitted for substance abuse and suicide ideation with an attempt to overdose on fentanyl. Pt stated [place] as evidenced by mosquito and tick bites all over the legs and arms. Pt reports use of opiates and heroin, has hx of suicide attempt in the past and looking for help with drugs. Pt is alert and oriented x4, calm and cooperative with admission process. Pt is made high fall risk due a cut on his left foot which is causing him to limp. Pt denied SI/HI and contracted for safety. Consents signed, skin/belongings search completed and pt oriented to unit. Pt stable at this time. Pt given the opportunity to express concerns and ask questions. Pt given toiletries. Will continue to monitor.

## 2020-08-09 NOTE — Tx Team (Signed)
Initial Treatment Plan 08/09/2020 11:40 PM CHANCE MUNTER [id number]    PATIENT STRESSORS: Financial difficulties Health problems Occupational concerns Substance abuse   PATIENT STRENGTHS: Communication skills Motivation for treatment/growth   PATIENT IDENTIFIED PROBLEMS: Substance use  Depression  "Coping skills"  "Help with drug use"               DISCHARGE CRITERIA:  Ability to meet basic life and health needs Adequate post-discharge living arrangements Improved stabilization in mood, thinking, and/or behavior Safe-care adequate arrangements made Verbal commitment to aftercare and medication compliance  PRELIMINARY DISCHARGE PLAN: Attend aftercare/continuing care group Attend PHP/IOP Outpatient therapy Placement in alternative living arrangements  PATIENT/FAMILY INVOLVEMENT: This treatment plan has been presented to and reviewed with the patient, SAMEL BRUNA, and/or family member.  The patient and family have been given the opportunity to ask questions and make suggestions.  Bethann Punches, RN 08/09/2020, 11:40 PM

## 2020-08-09 NOTE — ED Notes (Signed)
Pt changed into [employer] and personal belongings secured in Pt [place] Room in 2 Separate Bags. Pt wanded by security. Pt cooperative at this time. Pt provided PO Fluids and snack.

## 2020-08-10 ENCOUNTER — Encounter (HOSPITAL_COMMUNITY): Payer: Self-pay | Admitting: Psychiatry

## 2020-08-10 DIAGNOSIS — F333 Major depressive disorder, recurrent, severe with psychotic symptoms: Secondary | ICD-10-CM | POA: Diagnosis present

## 2020-08-10 DIAGNOSIS — F1994 Other psychoactive substance use, unspecified with psychoactive substance-induced mood disorder: Secondary | ICD-10-CM

## 2020-08-10 DIAGNOSIS — F1124 Opioid dependence with opioid-induced mood disorder: Secondary | ICD-10-CM

## 2020-08-10 DIAGNOSIS — F1123 Opioid dependence with withdrawal: Secondary | ICD-10-CM

## 2020-08-10 MED ORDER — CLONIDINE HCL 0.1 MG PO TABS
0.1000 mg | ORAL_TABLET | Freq: Every day | ORAL | Status: AC
  Administered 2020-08-15 – 2020-08-16 (×2): 0.1 mg via ORAL
  Filled 2020-08-10 (×2): qty 1

## 2020-08-10 MED ORDER — ACETAMINOPHEN 325 MG PO TABS
650.0000 mg | ORAL_TABLET | Freq: Four times a day (QID) | ORAL | Status: DC | PRN
  Administered 2020-08-16: 650 mg via ORAL
  Filled 2020-08-10 (×2): qty 2

## 2020-08-10 MED ORDER — TRAZODONE HCL 50 MG PO TABS
50.0000 mg | ORAL_TABLET | Freq: Every evening | ORAL | Status: DC | PRN
Start: 2020-08-10 — End: 2020-08-16
  Administered 2020-08-11 – 2020-08-15 (×5): 50 mg via ORAL
  Filled 2020-08-10 (×3): qty 1
  Filled 2020-08-10: qty 14
  Filled 2020-08-10 (×3): qty 1

## 2020-08-10 MED ORDER — ONDANSETRON 4 MG PO TBDP
4.0000 mg | ORAL_TABLET | Freq: Four times a day (QID) | ORAL | Status: AC | PRN
  Filled 2020-08-10: qty 1

## 2020-08-10 MED ORDER — NICOTINE 21 MG/24HR TD PT24
21.0000 mg | MEDICATED_PATCH | Freq: Every day | TRANSDERMAL | Status: DC
  Administered 2020-08-10 – 2020-08-17 (×8): 21 mg via TRANSDERMAL
  Filled 2020-08-10 (×13): qty 1

## 2020-08-10 MED ORDER — METHOCARBAMOL 500 MG PO TABS
500.0000 mg | ORAL_TABLET | Freq: Three times a day (TID) | ORAL | Status: AC | PRN
  Administered 2020-08-10 – 2020-08-14 (×7): 500 mg via ORAL
  Filled 2020-08-10 (×7): qty 1

## 2020-08-10 MED ORDER — LOPERAMIDE HCL 2 MG PO CAPS: 2.0000 mg | ORAL_CAPSULE | ORAL | Status: AC | PRN

## 2020-08-10 MED ORDER — CLONIDINE HCL 0.1 MG PO TABS
0.1000 mg | ORAL_TABLET | Freq: Four times a day (QID) | ORAL | Status: AC
  Administered 2020-08-10 – 2020-08-12 (×8): 0.1 mg via ORAL
  Filled 2020-08-10 (×10): qty 1

## 2020-08-10 MED ORDER — POTASSIUM CHLORIDE CRYS ER 20 MEQ PO TBCR
20.0000 meq | EXTENDED_RELEASE_TABLET | Freq: Two times a day (BID) | ORAL | Status: AC
  Administered 2020-08-10 – 2020-08-11 (×2): 20 meq via ORAL
  Filled 2020-08-10 (×3): qty 1

## 2020-08-10 MED ORDER — HYDROXYZINE HCL 25 MG PO TABS: 25.0000 mg | ORAL_TABLET | Freq: Three times a day (TID) | ORAL | Status: DC | PRN

## 2020-08-10 MED ORDER — CLONIDINE HCL 0.1 MG PO TABS
0.1000 mg | ORAL_TABLET | ORAL | Status: AC
Start: 2020-08-12 — End: 2020-08-14
  Administered 2020-08-12 – 2020-08-14 (×4): 0.1 mg via ORAL
  Filled 2020-08-10 (×4): qty 1

## 2020-08-10 MED ORDER — ALUM & MAG HYDROXIDE-SIMETH 200-200-20 MG/5ML PO SUSP: 30.0000 mL | ORAL | Status: DC | PRN

## 2020-08-10 MED ORDER — HYDROXYZINE HCL 25 MG PO TABS
25.0000 mg | ORAL_TABLET | Freq: Four times a day (QID) | ORAL | Status: AC | PRN
  Administered 2020-08-11 – 2020-08-14 (×4): 25 mg via ORAL
  Filled 2020-08-10 (×5): qty 1

## 2020-08-10 MED ORDER — DICYCLOMINE HCL 20 MG PO TABS
20.0000 mg | ORAL_TABLET | Freq: Four times a day (QID) | ORAL | Status: AC | PRN
  Administered 2020-08-11: 20 mg via ORAL
  Filled 2020-08-10 (×2): qty 1

## 2020-08-10 MED ORDER — NAPROXEN 500 MG PO TABS
500.0000 mg | ORAL_TABLET | Freq: Two times a day (BID) | ORAL | Status: AC | PRN
  Administered 2020-08-10 – 2020-08-13 (×4): 500 mg via ORAL
  Filled 2020-08-10 (×4): qty 1

## 2020-08-10 MED ORDER — MAGNESIUM HYDROXIDE 400 MG/5ML PO SUSP
30.0000 mL | Freq: Every day | ORAL | Status: DC | PRN
  Administered 2020-08-17: 30 mL via ORAL
  Filled 2020-08-10: qty 30

## 2020-08-10 NOTE — Progress Notes (Signed)
Psychoeducational Group Note  Date:  08/10/2020 Time:  2000  Group Topic/Focus:  Wrap up group  Participation Level: Did Not Attend  Participation Quality:  Not Applicable  Affect:  Not Applicable  Cognitive:  Not Applicable  Insight:  Not Applicable  Engagement in Group: Not Applicable  Additional Comments:  Did not attend.  Marcille Buffy 08/10/2020, 8:47 PM

## 2020-08-10 NOTE — H&P (Signed)
Psychiatric Admission Assessment Adult  Patient Identification: Joshua Thomas MRN:  [id number] Date of Evaluation:  08/10/2020 Chief Complaint:  MDD (major depressive disorder), recurrent, severe, with psychosis (HCC) [F33.3] Principal Diagnosis: <principal problem not specified> Diagnosis:  Active Problems:   MDD (major depressive disorder), recurrent, severe, with psychosis (HCC)  History of Present Illness: Patient is seen and examined.  Patient is a 45 year old male with a past psychiatric history significant for opiate dependence, opiate withdrawal, methamphetamine use disorder, substance-induced mood disorder who presented to the [facility] emergency department on 08/07/2020.  He presented there by emergency medical services for an overdose of heroin.  The patient was apparently at someone's house, and lawn for cement was called due to the patient not breathing.  When the patient arrived he was cyanotic, was given Narcan in route, and was given additional Narcan in the emergency department.  He told the emergency room physicians that he knew he did not have much time left on earth.  He stated that he knew he was going to go through withdrawal from heroin, but stated he needed help and rehabilitation.  He stated if he was released he would go somewhere and overdose again.  He was evaluated by the comprehensive clinical assessment team and reported that he had attempted to overdose on fentanyl on the day prior to the ER visit.  He stated that he had been feeling at peace when I was dead, felt good to be at rest.  He admitted that he had been using opiates since age 34.  In the emergency department he reported that the overdose was a suicide attempt, but on examination today waffled on that answer.  He did state that he had been suicidal since he was a teenager.  He stated he had been psychiatrically hospitalized approximately 4 times in the past.  He reported his last admission was at [facility], but that may have been for medical reasons.  He has probably 30 emergency room visits since his last hospitalization at our facility in 2017.  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  His discharge medications at that time included duloxetine, gabapentin, hydroxyzine, mirtazapine.  He also has a past medical history significant for hepatitis C as well as osteomyelitis.  He stated that he had not been treated for his hepatitis C, most likely secondary to the fact that he is continue to abuse substances.  He stated that his last substance rehabilitation admission was several years ago.  He stated that he remained at [facility] for approximately a month, and then went to [facility] but only stayed there for a few days.  He is not a good historian and when I asked him several questions about his history he referred me to talk to his mother.  He was admitted to the hospital for evaluation and stabilization.   Associated Signs/Symptoms: Depression Symptoms:  depressed mood, anhedonia, insomnia, fatigue, impaired memory, anxiety, disturbed sleep, Duration of Depression Symptoms: Greater than two weeks  (Hypo) Manic Symptoms:  Impulsivity, Irritable Mood, Labiality of Mood, Anxiety Symptoms:  Excessive Worry, Psychotic Symptoms:   Denied while not intoxicated. PTSD Symptoms: Negative Total Time spent with patient: 30 minutes  Past Psychiatric History: Patient has had apparently 1 or 2 previous psychiatric hospitalizations in the past.  His last hospitalization in our facility was in 2017.  He was diagnosed with a substance-induced mood disorder or substance-induced bipolar disorder.  This would include methamphetamine use disorder, cocaine use disorder, opiate use disorder and benzodiazepine use disorder he has been his  substance rehabilitation facility once or twice in the past, has been several years since his last attended's.  Is the patient at risk to self? Yes.    Has the patient been a risk to self in the past 6 months? No.  Has the patient been a risk to self within the distant past? No.  Is the patient a risk to others? No.  Has the patient been a risk to others in the past 6 months? No.  Has the patient been a risk to others within the distant past? No.   Prior Inpatient Therapy:   Prior Outpatient Therapy:    Alcohol Screening: Patient refused Alcohol Screening Tool: Yes 1. How often do you have a drink containing alcohol?: Monthly or less 2. How many drinks containing alcohol do you have on a typical day when you are drinking?: 3 or 4 3. How often do you have six or more drinks on one occasion?: Less than monthly AUDIT-C Score: 3 4. How often during the last year have you found that you were not able to stop drinking once you had started?: Less than monthly 5. How often during the last year have you failed to do what was normally expected from you because of drinking?: Less than monthly 6. How often during the last year have you needed a first drink in the morning to get yourself going after a heavy drinking session?: Less than monthly 7. How often during the last year have you had a feeling of guilt of remorse after drinking?: Never 8. How often during the last year have you been unable to remember what happened the night before because you had been drinking?: Never 9. Have you or someone else been injured as a result of your drinking?: No 10. Has a relative or friend or a doctor or another health worker been concerned about your drinking or suggested you cut down?: No Alcohol Use Disorder Identification Test Final Score (AUDIT): 6 Substance Abuse History in the last 12 months:  Yes.   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   COPD (chronic obstructive pulmonary disease) (HCC)    DDD (degenerative disc disease), lumbar    Degenerative disc disease, lumbar    Depression    Discitis    GERD (gastroesophageal reflux disease)    Hepatitis C    Hepatitis C    History of stomach ulcers    Hx of diabetes mellitus    due to infection   Hypertension    Lung nodules    3 on L and 2 on R   PTSD (post-traumatic stress disorder)    Sciatica    Substance abuse (HCC)     Past Surgical History:  Procedure Laterality Date   FRACTURE SURGERY     HARDWARE REMOVAL  01/27/2012   Procedure: HARDWARE REMOVAL;  Surgeon: Kathryne Hitch, MD;  Location: [facility];  Service: Orthopedics;  Laterality: Left;   IR LUMBAR DISC ASPIRATION W/IMG GUIDE  06/08/2017   RADIOLOGY WITH ANESTHESIA N/A 06/08/2017   Procedure: DISC ASPIRATION;  Surgeon: Julieanne Cotton, MD;  Location: [facility] OR;  Service: Radiology;  Laterality: N/A;   TIBIA IM NAIL INSERTION  01/27/2012   Procedure: INTRAMEDULLARY (IM) NAIL TIBIAL;  Surgeon: Kathryne Hitch, MD;  Location: [facility];  Service: Orthopedics;  Laterality: Left;  Removal of IM Rod and Screws Left Tibia with Exchange Nail, Allograft Bone Graft Left Tibia   Family History:  Family History  Problem Relation Age of Onset   Diabetes Mother    Alcohol abuse Father    Cirrhosis Father    Aneurysm Father    Family Psychiatric  History: Father was reportedly an alcoholic.  Uncle apparently has bipolar disorder.  No suicides in his family. Tobacco Screening: Have you used any form of tobacco in the last 30 days? (Cigarettes, Smokeless Tobacco, Cigars, and/or Pipes): Yes Tobacco  use, Select all that apply: 5 or more cigarettes per day Are you interested in Tobacco Cessation Medications?: Yes, will notify MD for an order Counseled patient on smoking cessation including recognizing danger situations, developing coping skills and basic information about quitting provided: Yes Social History:  Social History   Substance and Sexual Activity  Alcohol Use Yes   Comment: occasionally     Social History   Substance and Sexual Activity  Drug Use Yes   Types: Cocaine, IV, Marijuana, Methamphetamines   Comment: opiates-  none april 2021    Additional Social History:                           Allergies:  No Known Allergies Lab Results:  Results for orders placed or performed during the hospital encounter of 08/09/20 (from the past 48 hour(s))  Rapid urine drug screen (hospital performed)     Status: None   Collection Time: 08/08/20 10:45 PM  Result Value Ref Range   Opiates NONE DETECTED NONE DETECTED   Cocaine NONE DETECTED NONE DETECTED   Benzodiazepines NONE DETECTED NONE DETECTED   Amphetamines NONE DETECTED NONE DETECTED   Tetrahydrocannabinol NONE DETECTED NONE DETECTED   Barbiturates NONE DETECTED NONE DETECTED    Comment: (NOTE) DRUG SCREEN FOR MEDICAL PURPOSES ONLY.  IF CONFIRMATION IS NEEDED FOR ANY PURPOSE, NOTIFY LAB WITHIN 5 DAYS.  LOWEST DETECTABLE LIMITS FOR URINE DRUG SCREEN Drug Class                     Cutoff (ng/mL) Amphetamine and metabolites    1000 Barbiturate and metabolites    200 Benzodiazepine                 200 Tricyclics and metabolites     300 Opiates and metabolites        300 Cocaine and metabolites        300 THC                            50 Performed at [facility], [address], [place], [place] [address]   Acetaminophen level     Status: Abnormal   Collection Time: 08/08/20 11:18 PM  Result Value Ref Range   Acetaminophen (Tylenol), Serum <10 (L) 10 - 30 ug/mL    Comment: (NOTE) Therapeutic  concentrations vary significantly. A range of 10-30 ug/mL  may be an effective concentration for many patients.  However, some  are best treated at concentrations outside of this range. Acetaminophen concentrations >150 ug/mL at 4 hours after ingestion  and >50 ug/mL at 12 hours after ingestion are often associated with  toxic reactions.  Performed at [facility], [address] [place], [place] [address]   Salicylate level     Status: Abnormal   Collection Time: 08/08/20 11:18 PM  Result Value Ref Range   Salicylate Lvl <7.0 (L) 7.0 - 30.0 mg/dL    Comment: Performed at [facility], [address], [place], [place] [address]  Basic metabolic panel     Status: Abnormal   Collection Time: 08/08/20 11:18 PM  Result Value Ref Range   Sodium 132 (L) 135 - 145 mmol/L   Potassium 3.1 (L) 3.5 - 5.1 mmol/L   Chloride 100 98 - 111 mmol/L   CO2 27 22 - 32 mmol/L   Glucose, Bld 124 (H) 70 - 99 mg/dL    Comment: Glucose reference range applies only to samples taken after fasting for at least 8 hours.   BUN 13 6 - 20 mg/dL   Creatinine, Ser 0.98 0.61 - 1.24 mg/dL   Calcium 8.3 (L) 8.9 - 10.3 mg/dL   GFR, Estimated >11 >91 mL/min    Comment: (NOTE) Calculated using the CKD-EPI Creatinine Equation (2021)    Anion gap 5 5 - 15    Comment: Performed at [facility], [address] [place], [place] [address]  CBC with Differential     Status: Abnormal   Collection Time: 08/08/20 11:18 PM  Result Value Ref Range   WBC 6.1 4.0 - 10.5 K/uL   RBC 3.98 (L) 4.22 - 5.81 MIL/uL   Hemoglobin 12.3 (L) 13.0 - 17.0 g/dL   HCT 56.2 (L) 13.0 - 86.5 %   MCV 96.2 80.0 - 100.0 fL   MCH 30.9 26.0 - 34.0 pg   MCHC 32.1 30.0 - 36.0 g/dL   RDW 78.4 69.6 - 29.5 %   Platelets 127 (L) 150 - 400 K/uL   nRBC 0.0 0.0 - 0.2 %   Neutrophils Relative % 45 %   Neutro Abs 2.8 1.7 - 7.7 K/uL   Lymphocytes Relative 40 %   Lymphs Abs 2.5 0.7 - 4.0 K/uL   Monocytes Relative 10 %   Monocytes Absolute 0.6 0.1 - 1.0 K/uL    Eosinophils Relative 5 %   Eosinophils Absolute 0.3 0.0 - 0.5 K/uL   Basophils Relative 0 %   Basophils Absolute 0.0 0.0 - 0.1 K/uL   Immature Granulocytes 0 %   Abs Immature Granulocytes 0.02 0.00 - 0.07 K/uL    Comment: Performed at [facility], [address], [place], [place] [address]  Resp Panel by RT-PCR (Flu A&B, Covid) Nasopharyngeal Swab     Status: None   Collection Time: 08/09/20  1:21 AM   Specimen: Nasopharyngeal Swab; Nasopharyngeal(NP) swabs in vial transport medium  Result Value Ref Range   SARS Coronavirus 2 by RT PCR NEGATIVE NEGATIVE    Comment: (NOTE) SARS-CoV-2 target nucleic acids are NOT DETECTED.  The SARS-CoV-2 RNA is generally detectable in upper respiratory specimens during the acute phase of infection. The lowest concentration of SARS-CoV-2 viral copies this assay can detect is 138 copies/mL. A negative result does not preclude SARS-Cov-2 infection and should not be used as the sole basis for treatment or other patient management decisions. A negative result may occur with  improper specimen collection/handling, submission of specimen other than nasopharyngeal swab, presence of  viral mutation(s) within the areas targeted by this assay, and inadequate number of viral copies(<138 copies/mL). A negative result must be combined with clinical observations, patient history, and epidemiological information. The expected result is Negative.  Fact Sheet for Patients:  BloggerCourse.com  Fact Sheet for Healthcare Providers:  SeriousBroker.it  This test is no t yet approved or cleared by the [place] FDA and  has been authorized for detection and/or diagnosis of SARS-CoV-2 by FDA under an Emergency Use Authorization (EUA). This EUA will remain  in effect (meaning this test can be used) for the duration of the COVID-19 declaration under Section 564(b)(1) of the Act, 21 U.S.C.section 360bbb-3(b)(1), unless  the authorization is terminated  or revoked sooner.       Influenza A by PCR NEGATIVE NEGATIVE   Influenza B by PCR NEGATIVE NEGATIVE    Comment: (NOTE) The Xpert Xpress SARS-CoV-2/FLU/RSV plus assay is intended as an aid in the diagnosis of influenza from Nasopharyngeal swab specimens and should not be used as a sole basis for treatment. Nasal washings and aspirates are unacceptable for Xpert Xpress SARS-CoV-2/FLU/RSV testing.  Fact Sheet for Patients: BloggerCourse.com  Fact Sheet for Healthcare Providers: SeriousBroker.it  This test is not yet approved or cleared by the [place] FDA and has been authorized for detection and/or diagnosis of SARS-CoV-2 by FDA under an Emergency Use Authorization (EUA). This EUA will remain in effect (meaning this test can be used) for the duration of the COVID-19 declaration under Section 564(b)(1) of the Act, 21 U.S.C. section 360bbb-3(b)(1), unless the authorization is terminated or revoked.  Performed at [facility], [address], [place], [place] [address]     Blood Alcohol level:  Lab Results  Component Value Date   [facility] <10 05/10/2019   ETH <10 04/26/2019    Metabolic Disorder Labs:  Lab Results  Component Value Date   HGBA1C 6.6 (H) 06/13/2017   MPG 142.72 06/13/2017   MPG 120 05/23/2015   Lab Results  Component Value Date   PROLACTIN 31.3 (H) 05/23/2015   Lab Results  Component Value Date   CHOL 152 05/23/2015   TRIG 191 (H) 05/23/2015   HDL 27 (L) 05/23/2015   CHOLHDL 5.6 05/23/2015   VLDL 38 05/23/2015   LDLCALC 87 05/23/2015    Current Medications: Current Facility-Administered Medications  Medication Dose Route Frequency Provider Last Rate Last Admin   acetaminophen (TYLENOL) tablet 650 mg  650 mg Oral Q6H PRN Ajibola, Ene A, NP       alum & mag hydroxide-simeth (MAALOX/MYLANTA) 200-200-20 MG/5ML suspension 30 mL  30 mL Oral Q4H PRN Ajibola, Ene A, NP        cloNIDine (CATAPRES) tablet 0.1 mg  0.1 mg Oral QID Ajibola, Ene A, NP   0.1 mg at 08/10/20 0945   Followed by   Melene Muller ON 08/12/2020] cloNIDine (CATAPRES) tablet 0.1 mg  0.1 mg Oral BH-qamhs Ajibola, Ene A, NP       Followed by   Melene Muller ON 08/15/2020] cloNIDine (CATAPRES) tablet 0.1 mg  0.1 mg Oral QAC breakfast Ajibola, Ene A, NP       dicyclomine (BENTYL) tablet 20 mg  20 mg Oral Q6H PRN Ajibola, Ene A, NP       hydrOXYzine (ATARAX/VISTARIL) tablet 25 mg  25 mg Oral Q6H PRN Ajibola, Ene A, NP       loperamide (IMODIUM) capsule 2-4 mg  2-4 mg Oral PRN Ajibola, Ene A, NP       magnesium hydroxide (  MILK OF MAGNESIA) suspension 30 mL  30 mL Oral Daily PRN Ajibola, Ene A, NP       methocarbamol (ROBAXIN) tablet 500 mg  500 mg Oral Q8H PRN Ajibola, Ene A, NP       naproxen (NAPROSYN) tablet 500 mg  500 mg Oral BID PRN Ajibola, Ene A, NP       nicotine (NICODERM CQ - dosed in mg/24 hours) patch 21 mg  21 mg Transdermal Daily Ajibola, Ene A, NP   21 mg at 08/10/20 0947   ondansetron (ZOFRAN-ODT) disintegrating tablet 4 mg  4 mg Oral Q6H PRN Ajibola, Ene A, NP       potassium chloride SA (KLOR-CON) CR tablet 20 mEq  20 mEq Oral BID Antonieta Pert, MD       traZODone (DESYREL) tablet 50 mg  50 mg Oral QHS PRN Ajibola, Ene A, NP       PTA Medications: Medications Prior to Admission  Medication Sig Dispense Refill Last Dose   Aspirin-Acetaminophen-Caffeine (GOODY HEADACHE PO) Take by mouth.      doxycycline (VIBRAMYCIN) 100 MG capsule Take 1 capsule (100 mg total) by mouth daily for 7 days. 7 capsule 0    ibuprofen (ADVIL) 200 MG tablet Take 200 mg by mouth every 6 (six) hours as needed.      naloxone (NARCAN) nasal spray 4 mg/0.1 mL Take in case of overdose 1 each 0    traZODone (DESYREL) 100 MG tablet Take 100 mg by mouth at bedtime. (Patient not taking: No sig reported)       Musculoskeletal: Strength & Muscle Tone: within normal limits Gait & Station: normal Patient leans:  N/A            Psychiatric Specialty Exam:  Presentation  General Appearance: Disheveled  Eye Contact:Fair  Speech:Normal Rate  Speech Volume:Normal  Handedness:Right   Mood and Affect  Mood:Anxious; Dysphoric  Affect:Congruent   Thought Process  Thought Processes:Goal Directed  Duration of Psychotic Symptoms: No data recorded Past Diagnosis of Schizophrenia or Psychoactive disorder: No data recorded Descriptions of Associations:Circumstantial  Orientation:Full (Time, Place and Person)  Thought Content:Logical  Hallucinations:Hallucinations: None  Ideas of Reference:None  Suicidal Thoughts:Suicidal Thoughts: No  Homicidal Thoughts:Homicidal Thoughts: No   Sensorium  Memory:Immediate Poor; Recent Poor; Remote Poor  Judgment:Impaired  Insight:Lacking   Executive Functions  Concentration:Fair  Attention Span:Fair  Recall:Poor  Fund of Knowledge:Fair  Language:Fair   Psychomotor Activity  Psychomotor Activity:Psychomotor Activity: Increased; Restlessness   Assets  Assets:Desire for Improvement; Resilience   Sleep  Sleep:Sleep: Good Number of Hours of Sleep: 6.5    Physical Exam: Physical Exam Vitals and nursing note reviewed.  HENT:     Head: Normocephalic and atraumatic.  Pulmonary:     Effort: Pulmonary effort is normal.  Neurological:     General: No focal deficit present.     Mental Status: He is alert and oriented to person, place, and time.   Review of Systems  Musculoskeletal:  Positive for back pain and myalgias.  Psychiatric/Behavioral:  The patient has insomnia.   All other systems reviewed and are negative. Blood pressure 132/88, pulse 61, temperature 98.8 F (37.1 C), temperature source Oral, resp. rate 18, height 5\' 3"  (1.6 m), weight 67.8 kg, SpO2 100 %. Body mass index is 26.48 kg/m.  Treatment Plan Summary: Daily contact with patient to assess and evaluate symptoms and progress in treatment,  Medication management, and Plan Daily contact with patient to assess and evaluate symptoms and  progress in treatment, Medication management, and Plan Patient is seen and examined.  Patient is a 45 year old male with the above-stated past psychiatric history who was admitted with suicidal ideation and substance use disorders.  He will be admitted to the hospital.  He will be integrated in the milieu.  He will be encouraged to attend groups.  He has been placed on the opiate detox protocol.  We will hold off on starting any other psychiatric medications until his withdrawal symptoms improved.  Perhaps we will be able to assess what is under his withdrawal symptoms once he is more stable.  I have referred him to discuss with social work the possibility of rehabilitation facilities.  Per his report he is currently homeless.  He told the emergency room that he been sleeping in the woods, but then the emergency room services folks say was picked up at probably some drug house.  Again hopefully as his withdrawal symptoms improve he will give us a better history on what is occurred most recently.  Review of his admission laboratories revealed a mildly low potassium at 3.1.  That will be supplemented.  His blood sugar was mildly elevated 124.  Creatinine was normal at 0.84.  His CBC revealed a mild anemia with a hemoglobin of 12.3 and hematocrit of 38.2.  5 months ago his hemoglobin was 4.83 and hematocrit was 14.8.  I suspect that this is probably a nutritionally induced iron deficiency anemia.  We will get an anemia panel on him just in case.  His differential was normal.  His platelet count was 127,000.  Several months ago his platelet count was 131,000.  This is low normal, but may be related to liver disease/hepatitis C.  It may also be related to his anemia.  Acetaminophen was less than 10, salicylate less than 7.  Respiratory panel was negative for influenza A, B and coronavirus.  Drug screen was completely negative.   His EKG revealed a sinus tachycardia with an incomplete right bundle branch block.  His QTc interval is also prolonged at 513.  We will repeat that after his withdrawal symptoms improved.  Also this may be related to his hypokalemia.  Currently his vital signs are stable, he is afebrile.  Pulse oximetry on room air was 100%.  Hopefully we can get him feeling better.  Observation Level/Precautions:  Detox 15 minute checks  Laboratory:  CBC Chemistry Profile HbAIC UDS UA  Psychotherapy:    Medications:    Consultations:    Discharge Concerns:    Estimated LOS:  Other:     Physician Treatment Plan for Primary Diagnosis: <principal problem not specified> Long Term Goal(s): Improvement in symptoms so as ready for discharge  Short Term Goals: Ability to identify changes in lifestyle to reduce recurrence of condition will improve, Ability to verbalize feelings will improve, Ability to disclose and discuss suicidal ideas, Ability to demonstrate self-control will improve, Ability to identify and develop effective coping behaviors will improve, Ability to maintain clinical measurements within normal limits will improve, Compliance with prescribed medications will improve, and Ability to identify triggers associated with substance abuse/mental health issues will improve  Physician Treatment Plan for Secondary Diagnosis: Active Problems:   MDD (major depressive disorder), recurrent, severe, with psychosis (HCC)  Long Term Goal(s): Improvement in symptoms so as ready for discharge  Short Term Goals: Ability to identify changes in lifestyle to reduce recurrence of condition will improve, Ability to verbalize feelings will improve, Ability to disclose and discuss suicidal ideas,  Ability to demonstrate self-control will improve, Ability to identify and develop effective coping behaviors will improve, Ability to maintain clinical measurements within normal limits will improve, Compliance with prescribed  medications will improve, and Ability to identify triggers associated with substance abuse/mental health issues will improve  I certify that inpatient services furnished can reasonably be expected to improve the patient's condition.    Antonieta Pert, MD 7/31/20223:03 PM

## 2020-08-10 NOTE — Progress Notes (Signed)
   08/10/20 1200  Psych Admission Type (Psych Patients Only)  Admission Status Voluntary  Psychosocial Assessment  Patient Complaints Anxiety;Depression  Eye Contact Fair  Facial Expression Worried;Sad  Affect Anxious;Depressed  Speech Logical/coherent  Interaction Assertive  Motor Activity Slow  Appearance/Hygiene In scrubs  Behavior Characteristics Cooperative;Appropriate to situation  Mood Anxious;Depressed  Aggressive Behavior  Targets Property  Type of Behavior Striking out  Effect No apparent injury  Thought Process  Coherency WDL  Content WDL  Delusions WDL  Perception WDL  Hallucination None reported or observed  Judgment Poor  Confusion None  Danger to Self  Current suicidal ideation? Denies  Danger to Others  Danger to Others None reported or observed

## 2020-08-10 NOTE — Progress Notes (Signed)
   08/10/20 2342  Psych Admission Type (Psych Patients Only)  Admission Status Voluntary  Psychosocial Assessment  Patient Complaints Anxiety;Substance abuse  Eye Contact Fair  Facial Expression Flat  Affect Appropriate to circumstance  Speech Logical/coherent  Interaction Assertive  Motor Activity Other (Comment) (WDL)  Appearance/Hygiene In scrubs  Behavior Characteristics Appropriate to situation  Mood Pleasant  Thought Process  Coherency WDL  Content WDL  Delusions None reported or observed  Perception WDL  Hallucination None reported or observed  Judgment Poor  Confusion None  Danger to Self  Current suicidal ideation? Denies  Danger to Others  Danger to Others None reported or observed

## 2020-08-10 NOTE — [facility] Group Notes (Signed)
[facility] LCSW Group Therapy Note  08/10/2020    Type of Therapy and Topic:  Group Therapy:  A Hero Worthy of Support  Participation Level:  Did Not Attend   Description of Group:  Patients in this group were introduced to the concept that additional supports including self-support are an essential part of recovery.  Matching needs with supports to help fulfill those needs was explained.  Establishing boundaries that can gradually be increased or decreased was described, with patients giving their own examples of establishing appropriate boundaries in their lives.  A song entitled "My Own Hero" was played and a group discussion ensued in which patients stated it inspired them to help themselves in order to succeed, because other people cannot achieve their goals such as sobriety or stability for them.  A song was played called "I Am Enough" which led to a discussion about being willing to believe we are worth the effort of being a self-support.   Therapeutic Goals: 1)  demonstrate the importance of being a key part of one's own support system 2)  discuss various available supports 3)  encourage patient to use music as part of their self-support and focus on goals 4)  elicit ideas from patients about supports that need to be added   Summary of Patient Progress:  The patient was invited to group, did not attend.  Therapeutic Modalities:   Motivational Interviewing Activity  Orbie Grupe J Grossman-Orr      
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
## 2020-08-11 LAB — FOLATE: Folate: 12.5 ng/mL (ref >5.9)

## 2020-08-11 LAB — FERRITIN: Ferritin: 139 ng/mL (ref 24–336)

## 2020-08-11 LAB — IRON AND TIBC
Iron: 75 ug/dL (ref 45–182)
Saturation Ratios: 24 % (ref 17.9–39.5)
TIBC: 319 ug/dL (ref 250–450)
UIBC: 244 ug/dL

## 2020-08-11 LAB — RETICULOCYTES
Immature Retic Fract: 13.4 % (ref 2.3–15.9)
RBC.: 4.25 MIL/uL (ref 4.22–5.81)
Retic Count, Absolute: 64.2 10*3/uL (ref 19.0–186.0)
Retic Ct Pct: 1.5 % (ref 0.4–3.1)

## 2020-08-11 LAB — VITAMIN B12: Vitamin B-12: 191 pg/mL (ref 180–914)

## 2020-08-11 LAB — TSH: TSH: 0.337 u[IU]/mL — ABNORMAL LOW (ref 0.350–4.500)

## 2020-08-11 MED ORDER — AMLODIPINE BESYLATE 5 MG PO TABS
5.0000 mg | ORAL_TABLET | Freq: Every day | ORAL | Status: DC
  Administered 2020-08-11 – 2020-08-17 (×7): 5 mg via ORAL
  Filled 2020-08-11: qty 14
  Filled 2020-08-11 (×8): qty 1
  Filled 2020-08-11 (×2): qty 14

## 2020-08-11 NOTE — Progress Notes (Signed)
Recreation Therapy Notes  Date:  8.1.22 Time: 0930 Location: [address] Dayroom  Group Topic: Stress Management  Goal Area(s) Addresses:  Patient will identify positive stress management techniques. Patient will identify benefits of using stress management post d/c.  Intervention: Stress Management  Activity :  Meditation.  LRT played a meditation that focused on challenging negative thoughts.  Patients were to listen and follow along as meditation was played to fully engaged in activity.  Education:  Stress Management, Discharge Planning.   Education Outcome: Acknowledges Education  Clinical Observations/Feedback: Pt did not attend group session.    Caroll Rancher, LRT/CTRS         Caroll Rancher A 08/11/2020 11:29 AM

## 2020-08-11 NOTE — [facility] Group Notes (Signed)
Patient did not attend.   Spiritual care group on grief and loss facilitated by chaplain Dyanne Carrel, [facility]   Group Goal:   Support / Education around grief and loss   Members engage in facilitated group support and psycho-social education.   Group Description:   Following introductions and group rules, group members engaged in facilitated group dialog and support around topic of loss, with particular support around experiences of loss in their lives. Group Identified types of loss (relationships / self / things) and identified patterns, circumstances, and changes that precipitate losses. Reflected on thoughts / feelings around loss, normalized grief responses, and recognized variety in grief experience. Group noted Worden's four tasks of grief in discussion.   Group drew on Adlerian / Rogerian, narrative, MI,

## 2020-08-11 NOTE — [facility] Group Notes (Signed)
Patient did not attend morning orientation group.  

## 2020-08-11 NOTE — Tx Team (Signed)
Interdisciplinary Treatment and Diagnostic Plan Update  08/11/2020 Time of Session: 9:25am Joshua Thomas MRN: [id number]  Principal Diagnosis: <principal problem not specified>  Secondary Diagnoses: Active Problems:   MDD (major depressive disorder), recurrent, severe, with psychosis ([place])   Current Medications:  Current Facility-Administered Medications  Medication Dose Route Frequency Provider Last Rate Last Admin   acetaminophen (TYLENOL) tablet 650 mg  650 mg Oral Q6H PRN Ajibola, Ene A, NP       alum & mag hydroxide-simeth (MAALOX/MYLANTA) 200-200-20 MG/5ML suspension 30 mL  30 mL Oral Q4H PRN Ajibola, Ene A, NP       amLODipine (NORVASC) tablet 5 mg  5 mg Oral Daily Sharma Covert, MD   5 mg at 08/11/20 8413   cloNIDine (CATAPRES) tablet 0.1 mg  0.1 mg Oral QID Ajibola, Ene A, NP   0.1 mg at 08/11/20 2440   Followed by   Derrill Memo ON 08/12/2020] cloNIDine (CATAPRES) tablet 0.1 mg  0.1 mg Oral BH-qamhs Ajibola, Ene A, NP       Followed by   Derrill Memo ON 08/15/2020] cloNIDine (CATAPRES) tablet 0.1 mg  0.1 mg Oral QAC breakfast Ajibola, Ene A, NP       dicyclomine (BENTYL) tablet 20 mg  20 mg Oral Q6H PRN Ajibola, Ene A, NP       hydrOXYzine (ATARAX/VISTARIL) tablet 25 mg  25 mg Oral Q6H PRN Ajibola, Ene A, NP       loperamide (IMODIUM) capsule 2-4 mg  2-4 mg Oral PRN Ajibola, Ene A, NP       magnesium hydroxide (MILK OF MAGNESIA) suspension 30 mL  30 mL Oral Daily PRN Ajibola, Ene A, NP       methocarbamol (ROBAXIN) tablet 500 mg  500 mg Oral Q8H PRN Ajibola, Ene A, NP   500 mg at 08/10/20 1935   naproxen (NAPROSYN) tablet 500 mg  500 mg Oral BID PRN Ajibola, Ene A, NP   500 mg at 08/10/20 1935   nicotine (NICODERM CQ - dosed in mg/24 hours) patch 21 mg  21 mg Transdermal Daily Ajibola, Ene A, NP   21 mg at 08/11/20 0933   ondansetron (ZOFRAN-ODT) disintegrating tablet 4 mg  4 mg Oral Q6H PRN Ajibola, Ene A, NP       traZODone (DESYREL) tablet 50 mg  50 mg Oral QHS PRN Ajibola, Ene A, NP        PTA Medications: Medications Prior to Admission  Medication Sig Dispense Refill Last Dose   Aspirin-Acetaminophen-Caffeine (GOODY HEADACHE PO) Take by mouth.      doxycycline (VIBRAMYCIN) 100 MG capsule Take 1 capsule (100 mg total) by mouth daily for 7 days. 7 capsule 0    ibuprofen (ADVIL) 200 MG tablet Take 200 mg by mouth every 6 (six) hours as needed.      naloxone (NARCAN) nasal spray 4 mg/0.1 mL Take in case of overdose 1 each 0    traZODone (DESYREL) 100 MG tablet Take 100 mg by mouth at bedtime. (Patient not taking: No sig reported)       Patient Stressors: Financial difficulties Health problems Occupational concerns Substance abuse  Patient Strengths: Agricultural engineer for treatment/growth  Treatment Modalities: Medication Management, Group therapy, Case management,  1 to 1 session with clinician, Psychoeducation, Recreational therapy.   Physician Treatment Plan for Primary Diagnosis: <principal problem not specified> Long Term Goal(s): Improvement in symptoms so as ready for discharge   Short Term Goals: Ability to identify changes in lifestyle to  reduce recurrence of condition will improve Ability to verbalize feelings will improve Ability to disclose and discuss suicidal ideas Ability to demonstrate self-control will improve Ability to identify and develop effective coping behaviors will improve Ability to maintain clinical measurements within normal limits will improve Compliance with prescribed medications will improve Ability to identify triggers associated with substance abuse/mental health issues will improve  Medication Management: Evaluate patient's response, side effects, and tolerance of medication regimen.  Therapeutic Interventions: 1 to 1 sessions, Unit Group sessions and Medication administration.  Evaluation of Outcomes: Not Met  Physician Treatment Plan for Secondary Diagnosis: Active Problems:   MDD (major depressive  disorder), recurrent, severe, with psychosis ([place])  Long Term Goal(s): Improvement in symptoms so as ready for discharge   Short Term Goals: Ability to identify changes in lifestyle to reduce recurrence of condition will improve Ability to verbalize feelings will improve Ability to disclose and discuss suicidal ideas Ability to demonstrate self-control will improve Ability to identify and develop effective coping behaviors will improve Ability to maintain clinical measurements within normal limits will improve Compliance with prescribed medications will improve Ability to identify triggers associated with substance abuse/mental health issues will improve     Medication Management: Evaluate patient's response, side effects, and tolerance of medication regimen.  Therapeutic Interventions: 1 to 1 sessions, Unit Group sessions and Medication administration.  Evaluation of Outcomes: Not Met   RN Treatment Plan for Primary Diagnosis: <principal problem not specified> Long Term Goal(s): Knowledge of disease and therapeutic regimen to maintain health will improve  Short Term Goals: Ability to remain free from injury will improve, Ability to verbalize frustration and anger appropriately will improve, Ability to demonstrate self-control, Ability to identify and develop effective coping behaviors will improve, and Compliance with prescribed medications will improve  Medication Management: RN will administer medications as ordered by provider, will assess and evaluate patient's response and provide education to patient for prescribed medication. RN will report any adverse and/or side effects to prescribing provider.  Therapeutic Interventions: 1 on 1 counseling sessions, Psychoeducation, Medication administration, Evaluate responses to treatment, Monitor vital signs and CBGs as ordered, Perform/monitor CIWA, COWS, AIMS and Fall Risk screenings as ordered, Perform wound care treatments as  ordered.  Evaluation of Outcomes: Not Met   LCSW Treatment Plan for Primary Diagnosis: <principal problem not specified> Long Term Goal(s): Safe transition to appropriate next level of care at discharge, Engage patient in therapeutic group addressing interpersonal concerns.  Short Term Goals: Engage patient in aftercare planning with referrals and resources, Increase social support, Increase ability to appropriately verbalize feelings, Identify triggers associated with mental health/substance abuse issues, and Increase skills for wellness and recovery  Therapeutic Interventions: Assess for all discharge needs, 1 to 1 time with Social worker, Explore available resources and support systems, Assess for adequacy in community support network, Educate family and significant other(s) on suicide prevention, Complete Psychosocial Assessment, Interpersonal group therapy.  Evaluation of Outcomes: Not Met   Progress in Treatment: Attending groups: No. Participating in groups: No. Taking medication as prescribed: Yes. Toleration medication: Yes. Family/Significant other contact made: No, will contact:  if consent is provided Patient understands diagnosis: Yes. Discussing patient identified problems/goals with staff: Yes. Medical problems stabilized or resolved: Yes. Denies suicidal/homicidal ideation: Yes. Issues/concerns per patient self-inventory: No.   New problem(s) identified: No, Describe:  none  New Short Term/Long Term Goal(s): medication stabilization, elimination of SI thoughts, development of comprehensive mental wellness plan.    Patient Goals:  "A better life"  Discharge Plan or Barriers: Patient recently admitted. CSW will continue to follow and assess for appropriate referrals and possible discharge planning.    Reason for Continuation of Hospitalization: Anxiety Depression Medication stabilization Suicidal ideation  Estimated Length of Stay: 3-5  days  Attendees: Patient: Joshua Thomas 08/11/2020   Physician: Fatima Sanger, DO 08/11/2020   Nursing:  08/11/2020  RN Care Manager: 08/11/2020   Social Worker: Darletta Moll, LCSW 08/11/2020   Recreational Therapist:  08/11/2020   Other:  08/11/2020   Other:  08/11/2020   Other: 08/11/2020    Scribe for Treatment Team: Vassie Moselle, [place] 08/11/2020 11:55 AM

## 2020-08-11 NOTE — [facility] Group Notes (Signed)
Therapy Type: Group Therapy  Participation Level:  Did Not Attend   Patients received a worksheet with an outline of 2 gingerbread men with a separation in the middle of the page. One sign designated what the pt sees about themselves and the other is what others see. Pts were asked to introduce themselves and share something they like about themself. Pts were then asked to draw, write or color how they view themselves as well as how they are viewed by others. CSW led discussion about the feelings and words associated with each side.   Patient Summary:  Pt did not attend.   Amerie Beaumont, LCSWA Clinicial Social Worker [facility]  

## 2020-08-11 NOTE — BHH Counselor (Signed)
CSW attempted to complete PSA with pt but was unable to do so due to pt sleeping.  Fredirick Lathe, LCSWA Clinicial Social Worker [employer]
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
## 2020-08-11 NOTE — Progress Notes (Signed)
   08/11/20 0900  Psych Admission Type (Psych Patients Only)  Admission Status Voluntary  Psychosocial Assessment  Patient Complaints None  Eye Contact Fair  Facial Expression Anxious  Affect Appropriate to circumstance  Speech Logical/coherent  Interaction Assertive  Motor Activity Other (Comment) (WDL)  Appearance/Hygiene In scrubs  Behavior Characteristics Appropriate to situation;Cooperative;Anxious  Mood Anxious;Pleasant  Thought Process  Coherency WDL  Content WDL  Delusions None reported or observed  Perception WDL  Hallucination None reported or observed  Judgment Poor  Confusion None  Danger to Self  Current suicidal ideation? Denies  Danger to Others  Danger to Others None reported or observed  [place] NOVEL CORONAVIRUS (COVID-19) DAILY CHECK-OFF SYMPTOMS - answer yes or no to each - every day NO YES  Have you had a fever in the past 24 hours?  Fever (Temp > 37.80C / 100F) X   Have you had any of these symptoms in the past 24 hours? New Cough  Sore Throat   Shortness of Breath  Difficulty Breathing  Unexplained Body Aches   X   Have you had any one of these symptoms in the past 24 hours not related to allergies?   Runny Nose  Nasal Congestion  Sneezing   X   If you have had runny nose, nasal congestion, sneezing in the past 24 hours, has it worsened?  X   EXPOSURES - check yes or no X   Have you traveled outside the state in the past 14 days?  X   Have you been in contact with someone with a confirmed diagnosis of COVID-19 or PUI in the past 14 days without wearing appropriate PPE?  X   Have you been living in the same home as a person with confirmed diagnosis of COVID-19 or a PUI (household contact)?    X   Have you been diagnosed with COVID-19?    X              What to do next: Answered NO to all: Answered YES to anything:   Proceed with unit schedule Follow the [facility] Inpatient Flowsheet.

## 2020-08-12 DIAGNOSIS — F1110 Opioid abuse, uncomplicated: Secondary | ICD-10-CM | POA: Diagnosis present

## 2020-08-12 DIAGNOSIS — F1190 Opioid use, unspecified, uncomplicated: Secondary | ICD-10-CM | POA: Diagnosis present

## 2020-08-12 DIAGNOSIS — R45851 Suicidal ideations: Secondary | ICD-10-CM

## 2020-08-12 DIAGNOSIS — F1510 Other stimulant abuse, uncomplicated: Secondary | ICD-10-CM | POA: Diagnosis present

## 2020-08-12 DIAGNOSIS — F332 Major depressive disorder, recurrent severe without psychotic features: Secondary | ICD-10-CM | POA: Diagnosis present

## 2020-08-12 HISTORY — DX: Other stimulant abuse, uncomplicated: F15.10

## 2020-08-12 LAB — HIV-1 RNA QUANT-NO REFLEX-BLD
HIV 1 RNA Quant: <20 copies/mL
LOG10 HIV-1 RNA: UNDETERMINED log10copy/mL

## 2020-08-12 MED ORDER — GABAPENTIN 100 MG PO CAPS
100.0000 mg | ORAL_CAPSULE | Freq: Three times a day (TID) | ORAL | Status: DC
  Administered 2020-08-12 – 2020-08-14 (×6): 100 mg via ORAL
  Filled 2020-08-12 (×11): qty 1

## 2020-08-12 MED ORDER — SERTRALINE HCL 25 MG PO TABS
25.0000 mg | ORAL_TABLET | Freq: Every day | ORAL | Status: DC
  Administered 2020-08-12 – 2020-08-14 (×3): 25 mg via ORAL
  Filled 2020-08-12 (×5): qty 1

## 2020-08-12 NOTE — Progress Notes (Signed)
   08/12/20 1918  Psych Admission Type (Psych Patients Only)  Admission Status Voluntary  Psychosocial Assessment  Patient Complaints Insomnia  Eye Contact Fair  Facial Expression Anxious  Affect Appropriate to circumstance  Speech Logical/coherent  Interaction Assertive  Appearance/Hygiene In scrubs  Behavior Characteristics Resistant to care  Mood Anxious  Thought Process  Coherency WDL  Content WDL  Delusions None reported or observed  Perception WDL  Hallucination None reported or observed  Judgment Poor  Confusion None  Danger to Self  Current suicidal ideation? Denies  Danger to Others  Danger to Others None reported or observed
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
## 2020-08-12 NOTE — [facility] Counselor (Signed)
CSW complete most of the [facility] application for the pt and asked him to complete the rest that is highlighted. Pt said okay. CSW asked for pt to return application once it was completed so the referral could be sent out.   Fredirick Lathe, LCSWA Clinicial Social Worker [employer]

## 2020-08-12 NOTE — Progress Notes (Signed)
D:  Pt complied with scheduled medications. Pt continues to deny SI/HI/AVH, pain and discomfort. Pt verbally contracts for safety.   A: Pt was educated about the implications of non-adherence towards treatment. Pt was encouraged to comply with treatment.   R: Pt observed Q15 and safety maintained. Will continue to monitor and assess.

## 2020-08-12 NOTE — Progress Notes (Addendum)
[facility] MD Progress Note  08/12/2020 12:28 PM Joshua Thomas  MRN:  [id number] Subjective:   Joshua Thomas is a 45 yr old male who presented after an attempted overdose. PPH is significant for MDD, Suicide Attempt via Overdose, Polysubstance Abuse (Meth, Cocaine, Opiate, Benzo), hospitalized at [facility] 2017.   He reports that he is feeling better today.  He reports that his sleep has been great.  He reports his appetite has been good.  He reports no withdrawal symptoms other than a little bit of anxiety.  He reports no SI, HI, or AVH.  He reports that he has had some restless leg movements over the last few days as he has been withdrawing.  Discussed starting gabapentin for that.  He reported that he is willing to start gabapentin.  Discussed if he would be interested in starting an antidepressant.  He reports he is now ready to start an antidepressant so will start Zoloft for him.  He reports that his plan is still to go to rehab as he wishes to this get help to become and stay sober from substances.  He reports no concerns at present.   Principal Problem: MDD (major depressive disorder), recurrent episode, severe (HCC) Diagnosis: Principal Problem:   MDD (major depressive disorder), recurrent episode, severe (HCC) Active Problems:   Polysubstance abuse (HCC)   Substance induced mood disorder (HCC)   Suicidal ideation   Opioid abuse (HCC)   Amphetamine abuse (HCC)  Total Time spent with patient: 30 minutes  Past Psychiatric History: MDD, Suicide Attempt via Overdose, Polysubstance Abuse (Meth, Cocaine, Opiate, Benzo), hospitalized at [facility] 2017.  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   COPD (chronic obstructive pulmonary disease) (HCC)    DDD (degenerative disc disease),  lumbar    Degenerative disc disease, lumbar    Depression    Discitis    GERD (gastroesophageal reflux disease)    Hepatitis C    Hepatitis C    History of stomach ulcers    Hx of diabetes mellitus    due to infection   Hypertension    Lung nodules    3 on L and 2 on R   PTSD (post-traumatic stress disorder)    Sciatica    Substance abuse (HCC)     Past Surgical History:  Procedure Laterality Date   FRACTURE SURGERY     HARDWARE REMOVAL  01/27/2012   Procedure: HARDWARE REMOVAL;  Surgeon: Joshua Hitch, MD;  Location: [facility];  Service: Orthopedics;  Laterality: Left;   IR LUMBAR DISC ASPIRATION W/IMG GUIDE  06/08/2017   RADIOLOGY WITH ANESTHESIA N/A 06/08/2017   Procedure: DISC ASPIRATION;  Surgeon: Joshua Cotton, MD;  Location: [facility];  Service: Radiology;  Laterality: N/A;   TIBIA IM NAIL INSERTION  01/27/2012   Procedure: INTRAMEDULLARY (IM) NAIL TIBIAL;  Surgeon: Joshua Hitch, MD;  Location: [facility];  Service: Orthopedics;  Laterality: Left;  Removal of IM Rod and Screws Left Tibia with Exchange Nail, Allograft Bone Graft Left Tibia   Family History:  Family History  Problem Relation Age of Onset   Diabetes Mother    Alcohol abuse Father    Cirrhosis Father    Aneurysm Father  Family Psychiatric  History: Father- EtOH abuse Uncle- Bipolar Disorder Social History:  Social History   Substance and Sexual Activity  Alcohol Use Yes   Comment: occasionally     Social History   Substance and Sexual Activity  Drug Use Yes   Types: Cocaine, IV, Marijuana, Methamphetamines   Comment: opiates-  none april 2021    Social History   Socioeconomic History   Marital status: Single    Spouse name: Not on file   Number of children: Not on file   Years of education: Not on file   Highest education level: Not on file  Occupational History   Not on file  Tobacco Use   Smoking status: Every Day    Packs/day: 1.00    Years: 33.00    Pack years: 33.00     Types: Cigarettes   Smokeless tobacco: Former    Quit date: 08/08/2006   Tobacco comments:    1 PPD  Vaping Use   Vaping Use: Some days  Substance and Sexual Activity   Alcohol use: Yes    Comment: occasionally   Drug use: Yes    Types: Cocaine, IV, Marijuana, Methamphetamines    Comment: opiates-  none april 2021   Sexual activity: Not Currently  Other Topics Concern   Not on file  Social History Narrative   Not on file   Social Determinants of Health   Financial Resource Strain: Not on file  Food Insecurity: Not on file  Transportation Needs: Not on file  Physical Activity: Not on file  Stress: Not on file  Social Connections: Not on file   Additional Social History:                         Sleep: Fair  Appetite:  Good  Current Medications: Current Facility-Administered Medications  Medication Dose Route Frequency Provider Last Rate Last Admin   acetaminophen (TYLENOL) tablet 650 mg  650 mg Oral Q6H PRN Thomas, Joshua A, NP       alum & mag hydroxide-simeth (MAALOX/MYLANTA) 200-200-20 MG/5ML suspension 30 mL  30 mL Oral Q4H PRN Thomas, Joshua A, NP       amLODipine (NORVASC) tablet 5 mg  5 mg Oral Daily Joshua Pert, MD   5 mg at 08/12/20 4132   cloNIDine (CATAPRES) tablet 0.1 mg  0.1 mg Oral QID Thomas, Joshua A, NP   0.1 mg at 08/12/20 4401   Followed by   cloNIDine (CATAPRES) tablet 0.1 mg  0.1 mg Oral BH-qamhs Thomas, Joshua A, NP       Followed by   Melene Muller ON 08/15/2020] cloNIDine (CATAPRES) tablet 0.1 mg  0.1 mg Oral QAC breakfast Thomas, Joshua A, NP       dicyclomine (BENTYL) tablet 20 mg  20 mg Oral Q6H PRN Thomas, Joshua A, NP   20 mg at 08/11/20 1404   gabapentin (NEURONTIN) capsule 100 mg  100 mg Oral TID Joshua Franklin, MD       hydrOXYzine (ATARAX/VISTARIL) tablet 25 mg  25 mg Oral Q6H PRN Thomas, Joshua A, NP   25 mg at 08/11/20 1405   loperamide (IMODIUM) capsule 2-4 mg  2-4 mg Oral PRN Thomas, Joshua A, NP       magnesium hydroxide (MILK OF  MAGNESIA) suspension 30 mL  30 mL Oral Daily PRN Thomas, Joshua A, NP       methocarbamol (ROBAXIN) tablet 500 mg  500 mg Oral Q8H PRN  Cecilio Asper A, NP   500 mg at 08/11/20 2209   naproxen (NAPROSYN) tablet 500 mg  500 mg Oral BID PRN Thomas, Joshua A, NP   500 mg at 08/11/20 2118   nicotine (NICODERM CQ - dosed in mg/24 hours) patch 21 mg  21 mg Transdermal Daily Thomas, Joshua A, NP   21 mg at 08/12/20 0929   ondansetron (ZOFRAN-ODT) disintegrating tablet 4 mg  4 mg Oral Q6H PRN Thomas, Joshua A, NP       sertraline (ZOLOFT) tablet 25 mg  25 mg Oral Daily Pashayan, Mardelle Matte, MD       traZODone (DESYREL) tablet 50 mg  50 mg Oral QHS PRN Thomas, Joshua A, NP   50 mg at 08/11/20 2121    Lab Results:  Results for orders placed or performed during the hospital encounter of 08/09/20 (from the past 48 hour(s))  Vitamin B12     Status: None   Collection Time: 08/11/20  6:16 AM  Result Value Ref Range   Vitamin B-12 191 180 - 914 pg/mL    Comment: (NOTE) This assay is not validated for testing neonatal or myeloproliferative syndrome specimens for Vitamin B12 levels. Performed at [facility], [address], [place], [place] [address]   Folate     Status: None   Collection Time: 08/11/20  6:16 AM  Result Value Ref Range   Folate 12.5 >5.9 ng/mL    Comment: Performed at [facility], [address], [place], [place] [address]  Iron and TIBC     Status: None   Collection Time: 08/11/20  6:16 AM  Result Value Ref Range   Iron 75 45 - 182 ug/dL   TIBC 191 478 - 295 ug/dL   Saturation Ratios 24 17.9 - 39.5 %   UIBC 244 ug/dL    Comment: Performed at [facility], [address], [place], [place] [address]  Ferritin     Status: None   Collection Time: 08/11/20  6:16 AM  Result Value Ref Range   Ferritin 139 24 - 336 ng/mL    Comment: Performed at [facility], [address] [place], [place] [address]  Reticulocytes      Status: None   Collection Time: 08/11/20  6:16 AM  Result Value Ref Range   Retic Ct Pct 1.5 0.4 - 3.1 %   RBC. 4.25 4.22 - 5.81 MIL/uL   Retic Count, Absolute 64.2 19.0 - 186.0 K/uL   Immature Retic Fract 13.4 2.3 - 15.9 %    Comment: Performed at [facility], [address], [place], [place] [address]  TSH     Status: Abnormal   Collection Time: 08/11/20  6:44 PM  Result Value Ref Range   TSH 0.337 (L) 0.350 - 4.500 uIU/mL    Comment: Performed by a 3rd Generation assay with a functional sensitivity of <=0.01 uIU/mL. Performed at [facility], [address], [place], [place] [address]     Blood Alcohol level:  Lab Results  Component Value Date   [facility] <10 05/10/2019   ETH <10 04/26/2019    Metabolic Disorder Labs: Lab Results  Component Value Date   HGBA1C 6.6 (H) 06/13/2017   MPG 142.72 06/13/2017   MPG 120 05/23/2015   Lab Results  Component Value Date   PROLACTIN 31.3 (H) 05/23/2015   Lab Results  Component Value Date   CHOL 152 05/23/2015   TRIG 191 (H)  05/23/2015   HDL 27 (L) 05/23/2015   CHOLHDL 5.6 05/23/2015   VLDL 38 05/23/2015   LDLCALC 87 05/23/2015    Physical Findings: AIMS: Facial and Oral Movements Muscles of Facial Expression: None, normal Lips and Perioral Area: None, normal Jaw: None, normal Tongue: None, normal,Extremity Movements Upper (arms, wrists, hands, fingers): None, normal Lower (legs, knees, ankles, toes): None, normal, Trunk Movements Neck, shoulders, hips: None, normal, Overall Severity Severity of abnormal movements (highest score from questions above): None, normal Incapacitation due to abnormal movements: None, normal Patient's awareness of abnormal movements (rate only patient's report): No Awareness, Dental Status Current problems with teeth and/or dentures?: No Does patient usually wear dentures?: No  CIWA:    COWS:  COWS Total Score: 3  Musculoskeletal: Strength & Muscle Tone:  within normal limits Gait & Station: normal Patient leans: N/A  Psychiatric Specialty Exam:  Presentation  General Appearance: Disheveled  Eye Contact:Fair  Speech:Clear and Coherent; Normal Rate  Speech Volume:Normal  Handedness:Right   Mood and Affect  Mood:Anxious; Dysphoric  Affect:Congruent   Thought Process  Thought Processes:Coherent; Goal Directed  Descriptions of Associations:Intact  Orientation:Full (Time, Place and Person)  Thought Content:Logical, denies AVH, paranoia, ideas of reference or first rank symptoms; no acute psychosis on exam  History of Schizophrenia/Schizoaffective disorder:No data recorded Duration of Psychotic Symptoms:No data recorded Hallucinations:Hallucinations: None  Ideas of Reference:None  Suicidal Thoughts:Suicidal Thoughts: No  Homicidal Thoughts:Homicidal Thoughts: No   Sensorium  Memory:Immediate Fair; Recent Fair  Judgment:Poor  Insight:Fair   Executive Functions  Concentration:Fair  Attention Span:Fair  Recall:Fair  Fund of Knowledge:Fair  Language:Fair   Psychomotor Activity  Psychomotor Activity:Psychomotor Activity: Restlessness   Assets  Assets:Desire for Improvement; Resilience   Sleep  Sleep:Sleep: Good Number of Hours of Sleep: 6    Physical Exam: Physical Exam Vitals and nursing note reviewed.  Constitutional:      Appearance: Normal appearance. He is normal weight.  HENT:     Head: Normocephalic and atraumatic.  Pulmonary:     Effort: Pulmonary effort is normal.  Musculoskeletal:        General: Normal range of motion.  Neurological:     General: No focal deficit present.     Mental Status: He is alert.   Review of Systems  Constitutional:  Negative for fever.  Respiratory:  Negative for cough and shortness of breath.   Cardiovascular:  Negative for chest pain.  Gastrointestinal:  Negative for abdominal pain, constipation, diarrhea, nausea and vomiting.  Neurological:   Negative for weakness and headaches.  Psychiatric/Behavioral:  Negative for depression, hallucinations and suicidal ideas. The patient is nervous/anxious.   Blood pressure (!) 141/72, pulse (!) 52, temperature 97.7 F (36.5 C), temperature source Oral, resp. rate 18, height 5\' 3"  (1.6 m), weight 67.8 kg, SpO2 100 %. Body mass index is 26.48 kg/m.   Treatment Plan Summary: Daily contact with patient to assess and evaluate symptoms and progress in treatment   [name] is a 45 yr old male who presented after an attempted overdose with continued SI. PPH is significant for MDD, Suicide Attempt via Overdose, Polysubstance Abuse (Meth, Cocaine, Opiate, Benzo), [facility] 2017.   As he is willing now to start medication we will start Zoloft and gabapentin.  He reports he still desires to go to rehab.  Social work will continue to work on placement.  We will continue to monitor.   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

## 2020-08-12 NOTE — Progress Notes (Signed)
      08/11/20 2120  Psych Admission Type (Psych Patients Only)  Admission Status Voluntary  Psychosocial Assessment  Patient Complaints Anxiety  Eye Contact Fair  Facial Expression Anxious  Affect Appropriate to circumstance  Speech Logical/coherent  Interaction Assertive  Motor Activity Other (Comment) (wnl)  Appearance/Hygiene In scrubs  Behavior Characteristics Cooperative;Anxious  Mood Pleasant;Anxious  Thought Process  Coherency WDL  Content WDL  Delusions None reported or observed  Perception WDL  Hallucination None reported or observed  Judgment Poor  Confusion None  Danger to Self  Current suicidal ideation? Denies  Danger to Others  Danger to Others None reported or observed

## 2020-08-12 NOTE — Progress Notes (Signed)
Pt did not attend group. 

## 2020-08-12 NOTE — [facility] Counselor (Signed)
Pt approached CSW and asked that she call his mother to complete his [facility] application because he doesn't want to do it at this time. CSW informed pt that he must be involved in his treatment and must complete the remaining referral himself. CSW offered to read pt any of the remaining questions if needed. Pt somewhat completed application. CSW informed pt that not completing the application thoroughly will prevent him from getting accepted.   Fredirick Lathe, LCSWA Clinicial Social Worker [employer]

## 2020-08-12 NOTE — Progress Notes (Signed)
The patient attended last evening's group and was appropriate.  

## 2020-08-12 NOTE — Progress Notes (Signed)
Recreation Therapy Notes  Animal-Assisted Activity (AAA) Program Checklist/Progress Notes Patient Eligibility Criteria Checklist & Daily Group note for Rec Tx Intervention  Date: 8.2.22 Time: 1430 Location: [address]  AAA/T Program Assumption of Risk Form signed by Engineer, production or Parent Legal Guardian YES  Patient is free of allergies or severe asthma YES  Patient reports no fear of animals YES  Patient reports no history of cruelty to animals YES   Patient understands his/her participation is voluntary YES  Patient washes hands before animal contact YES  Patient washes hands after animal contact YES  Behavioral Response: Engaged  Education: Charity fundraiser, Appropriate Animal Interaction   Education Outcome: Acknowledges understanding/In group clarification offered/Needs additional education.   Clinical Observations/Feedback:  Pt attended and participated in activity.     Caroll Rancher, LRT/CTRS        Caroll Rancher A 08/12/2020 3:43 PM

## 2020-08-12 NOTE — [facility] Counselor (Signed)
CSW provided the Pt with a packet that contained information for: housing and shelter resources, [facility] information, free/reduced cost food options, Good[employer] cards, and Suicide Prevention information.

## 2020-08-12 NOTE — Progress Notes (Signed)
The patient's positive event for the day is that he received a telephone call from his family. His goal for tomorrow is to inquire about his discharge plans.

## 2020-08-12 NOTE — Plan of Care (Signed)
  Problem: Education: Goal: Emotional status will improve Outcome: Not Progressing Goal: Mental status will improve Outcome: Not Progressing   Problem: Health Behavior/Discharge Planning: Goal: Identification of resources available to assist in meeting health care needs will improve Outcome: Not Progressing

## 2020-08-13 LAB — COMPREHENSIVE METABOLIC PANEL
ALT: 26 U/L (ref 0–44)
AST: 18 U/L (ref 15–41)
Albumin: 3.4 g/dL — ABNORMAL LOW (ref 3.5–5.0)
Alkaline Phosphatase: 46 U/L (ref 38–126)
Anion gap: 6 (ref 5–15)
BUN: 12 mg/dL (ref 6–20)
CO2: 28 mmol/L (ref 22–32)
Calcium: 8.8 mg/dL — ABNORMAL LOW (ref 8.9–10.3)
Chloride: 105 mmol/L (ref 98–111)
Creatinine, Ser: 0.78 mg/dL (ref 0.61–1.24)
GFR, Estimated: >60 mL/min (ref >60)
Glucose, Bld: 173 mg/dL — ABNORMAL HIGH (ref 70–99)
Potassium: 4.2 mmol/L (ref 3.5–5.1)
Sodium: 139 mmol/L (ref 135–145)
Total Bilirubin: 0.4 mg/dL (ref 0.3–1.2)
Total Protein: 6.7 g/dL (ref 6.5–8.1)

## 2020-08-13 LAB — TSH: TSH: 1.181 u[IU]/mL (ref 0.350–4.500)

## 2020-08-13 LAB — T4, FREE: Free T4: 0.89 ng/dL (ref 0.61–1.12)

## 2020-08-13 NOTE — Progress Notes (Addendum)
[facility] MD Progress Note  08/13/2020 11:36 AM Joshua Thomas  MRN:  [id number] Subjective:   Joshua Thomas is a 45 yr old male who presented after an attempted overdose. PPH is significant for MDD, Suicide Attempt via Overdose, Polysubstance Abuse (Meth, Cocaine, Opiate, Benzo), hospitalized at [facility] 2017.   Patient was in bed with covers over his head initially today it did take him some time to get out of the bed and come to the hallway to talk.  There were conflicting reports that nursing stated he had reported some passive SI this morning and social work stated he had turned in incomplete paperwork for residential placement.   He reports that he is doing okay today.  He reports that his sleep was improved last night.  He reports his appetite continues to do well.  When asked about the nursing report of passive SI he reported that that was a misunderstanding and he reported absolutely no SI.  He also reports no HI or AVH.  When discussing the incomplete paperwork he said that this too was a misunderstanding and wished to speak with a Child psychotherapist.  Dr. Mason Jim got the social worker, Joshua Thomas, so that this can be discussed.  She reported that patient handed her the paperwork yesterday and asked that she call his mother to further complete the paperwork.  She said that she was unable to do this however he insisted on that and so she sent the paperwork out in its current state.  He reports that this does not sound like something he would do, and that he would like to complete the paperwork.  The social worker stated she would get the paperwork and return it to him after lunch so it could be completed and she would refax it out.  Discussed with patient that sleeping in bed all day and only attending 1 group is not good.  Encouraged him to attend all groups and interact with others on the unit during the day.  He reported that he would.  He reports no side effects from starting the medications.  He reports  no other concerns at present.   Principal Problem: MDD (major depressive disorder), recurrent episode, severe (HCC) Diagnosis: Principal Problem:   MDD (major depressive disorder), recurrent episode, severe (HCC) Active Problems:   Polysubstance abuse (HCC)   Substance induced mood disorder (HCC)   Suicidal ideation   Opioid abuse (HCC)   Amphetamine abuse (HCC)  Total Time spent with patient: 30 minutes  Past Psychiatric History: MDD, Suicide Attempt via Overdose, Polysubstance Abuse (Meth, Cocaine, Opiate, Benzo), hospitalized at [facility] 2017.  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   COPD (chronic obstructive pulmonary disease) (HCC)    DDD (degenerative disc disease), lumbar    Degenerative disc disease, lumbar    Depression    Discitis    GERD (gastroesophageal reflux disease)    Hepatitis C    Hepatitis C    History of stomach ulcers    Hx of diabetes mellitus    due to infection   Hypertension    Lung nodules    3 on L and 2 on R   PTSD (post-traumatic stress disorder)    Sciatica  Substance abuse [facility])     Past Surgical History:  Procedure Laterality Date   FRACTURE SURGERY     HARDWARE REMOVAL  01/27/2012   Procedure: HARDWARE REMOVAL;  Surgeon: Kathryne Hitch, MD;  Location: [facility];  Service: Orthopedics;  Laterality: Left;   IR LUMBAR DISC ASPIRATION W/IMG GUIDE  06/08/2017   RADIOLOGY WITH ANESTHESIA N/A 06/08/2017   Procedure: DISC ASPIRATION;  Surgeon: Julieanne Cotton, MD;  Location: [facility] OR;  Service: Radiology;  Laterality: N/A;   TIBIA IM NAIL INSERTION  01/27/2012   Procedure: INTRAMEDULLARY (IM) NAIL TIBIAL;  Surgeon: Kathryne Hitch, MD;  Location: [facility];  Service: Orthopedics;  Laterality: Left;  Removal of IM  Rod and Screws Left Tibia with Exchange Nail, Allograft Bone Graft Left Tibia   Family History:  Family History  Problem Relation Age of Onset   Diabetes Mother    Alcohol abuse Father    Cirrhosis Father    Aneurysm Father    Family Psychiatric  History:  Father- EtOH abuse Uncle- Bipolar Disorder Social History:  Social History   Substance and Sexual Activity  Alcohol Use Yes   Comment: occasionally     Social History   Substance and Sexual Activity  Drug Use Yes   Types: Cocaine, IV, Marijuana, Methamphetamines   Comment: opiates-  none april 2021    Social History   Socioeconomic History   Marital status: Single    Spouse name: Not on file   Number of children: Not on file   Years of education: Not on file   Highest education level: Not on file  Occupational History   Not on file  Tobacco Use   Smoking status: Every Day    Packs/day: 1.00    Years: 33.00    Pack years: 33.00    Types: Cigarettes   Smokeless tobacco: Former    Quit date: 08/08/2006   Tobacco comments:    1 PPD  Vaping Use   Vaping Use: Some days  Substance and Sexual Activity   Alcohol use: Yes    Comment: occasionally   Drug use: Yes    Types: Cocaine, IV, Marijuana, Methamphetamines    Comment: opiates-  none april 2021   Sexual activity: Not Currently  Other Topics Concern   Not on file  Social History Narrative   Not on file   Social Determinants of Health   Financial Resource Strain: Not on file  Food Insecurity: Not on file  Transportation Needs: Not on file  Physical Activity: Not on file  Stress: Not on file  Social Connections: Not on file   Additional Social History:                         Sleep: Good  Appetite:  Good  Current Medications: Current Facility-Administered Medications  Medication Dose Route Frequency Provider Last Rate Last Admin   acetaminophen (TYLENOL) tablet 650 mg  650 mg Oral Q6H PRN Ajibola, Ene A, NP       alum & mag  hydroxide-simeth (MAALOX/MYLANTA) 200-200-20 MG/5ML suspension 30 mL  30 mL Oral Q4H PRN Ajibola, Ene A, NP       amLODipine (NORVASC) tablet 5 mg  5 mg Oral Daily Antonieta Pert, MD   5 mg at 08/13/20 0900   cloNIDine (CATAPRES) tablet 0.1 mg  0.1 mg Oral BH-qamhs Ajibola, Ene A, NP   0.1 mg at 08/13/20 0900   Followed by   Melene Muller ON  08/15/2020] cloNIDine (CATAPRES) tablet 0.1 mg  0.1 mg Oral QAC breakfast Ajibola, Ene A, NP       dicyclomine (BENTYL) tablet 20 mg  20 mg Oral Q6H PRN Ajibola, Ene A, NP   20 mg at 08/11/20 1404   gabapentin (NEURONTIN) capsule 100 mg  100 mg Oral TID Lauro FranklinPashayan, Alexander S, MD   100 mg at 08/13/20 0900   hydrOXYzine (ATARAX/VISTARIL) tablet 25 mg  25 mg Oral Q6H PRN Ajibola, Ene A, NP   25 mg at 08/11/20 1405   loperamide (IMODIUM) capsule 2-4 mg  2-4 mg Oral PRN Ajibola, Ene A, NP       magnesium hydroxide (MILK OF MAGNESIA) suspension 30 mL  30 mL Oral Daily PRN Ajibola, Ene A, NP       methocarbamol (ROBAXIN) tablet 500 mg  500 mg Oral Q8H PRN Ajibola, Ene A, NP   500 mg at 08/12/20 2125   naproxen (NAPROSYN) tablet 500 mg  500 mg Oral BID PRN Ajibola, Ene A, NP   500 mg at 08/12/20 2125   nicotine (NICODERM CQ - dosed in mg/24 hours) patch 21 mg  21 mg Transdermal Daily Ajibola, Ene A, NP   21 mg at 08/13/20 0900   ondansetron (ZOFRAN-ODT) disintegrating tablet 4 mg  4 mg Oral Q6H PRN Ajibola, Ene A, NP       sertraline (ZOLOFT) tablet 25 mg  25 mg Oral Daily Pashayan, Mardelle MatteAlexander S, MD   25 mg at 08/13/20 0900   traZODone (DESYREL) tablet 50 mg  50 mg Oral QHS PRN Ajibola, Ene A, NP   50 mg at 08/12/20 2124    Lab Results:  Results for orders placed or performed during the hospital encounter of 08/09/20 (from the past 48 hour(s))  TSH     Status: Abnormal   Collection Time: 08/11/20  6:44 PM  Result Value Ref Range   TSH 0.337 (L) 0.350 - 4.500 uIU/mL    Comment: Performed by a 3rd Generation assay with a functional sensitivity of <=0.01  uIU/mL. Performed at [facility], [address] [place], [place] [address]   TSH     Status: None   Collection Time: 08/13/20  6:21 AM  Result Value Ref Range   TSH 1.181 0.350 - 4.500 uIU/mL    Comment: Performed by a 3rd Generation assay with a functional sensitivity of <=0.01 uIU/mL. Performed at [facility], [address] [place], [place] [address]   Comprehensive metabolic panel     Status: Abnormal   Collection Time: 08/13/20  6:21 AM  Result Value Ref Range   Sodium 139 135 - 145 mmol/L   Potassium 4.2 3.5 - 5.1 mmol/L   Chloride 105 98 - 111 mmol/L   CO2 28 22 - 32 mmol/L   Glucose, Bld 173 (H) 70 - 99 mg/dL    Comment: Glucose reference range applies only to samples taken after fasting for at least 8 hours.   BUN 12 6 - 20 mg/dL   Creatinine, Ser 0.980.78 0.61 - 1.24 mg/dL   Calcium 8.8 (L) 8.9 - 10.3 mg/dL   Total Protein 6.7 6.5 - 8.1 g/dL   Albumin 3.4 (L) 3.5 - 5.0 g/dL   AST 18 15 - 41 U/L   ALT 26 0 - 44 U/L   Alkaline Phosphatase 46 38 - 126 U/L   Total Bilirubin 0.4 0.3 - 1.2 mg/dL   GFR, Estimated >11>60 >91>60 mL/min    Comment: (NOTE)  Calculated using the CKD-EPI Creatinine Equation (2021)    Anion gap 6 5 - 15    Comment: Performed at [facility], [address] [place], [place] [address]  T4, free     Status: None   Collection Time: 08/13/20  6:21 AM  Result Value Ref Range   Free T4 0.89 0.61 - 1.12 ng/dL    Comment: (NOTE) Biotin ingestion may interfere with free T4 tests. If the results are inconsistent with the TSH level, previous test results, or the clinical presentation, then consider biotin interference. If needed, order repeat testing after stopping biotin. Performed at [facility], [address] [place], [place] [address]     Blood Alcohol level:  Lab Results  Component Value Date   [facility] <10 05/10/2019   ETH <10 04/26/2019    Metabolic Disorder Labs: Lab Results   Component Value Date   HGBA1C 6.6 (H) 06/13/2017   MPG 142.72 06/13/2017   MPG 120 05/23/2015   Lab Results  Component Value Date   PROLACTIN 31.3 (H) 05/23/2015   Lab Results  Component Value Date   CHOL 152 05/23/2015   TRIG 191 (H) 05/23/2015   HDL 27 (L) 05/23/2015   CHOLHDL 5.6 05/23/2015   VLDL 38 05/23/2015   LDLCALC 87 05/23/2015    Physical Findings: AIMS: Facial and Oral Movements Muscles of Facial Expression: None, normal Lips and Perioral Area: None, normal Jaw: None, normal Tongue: None, normal,Extremity Movements Upper (arms, wrists, hands, fingers): None, normal Lower (legs, knees, ankles, toes): None, normal, Trunk Movements Neck, shoulders, hips: None, normal, Overall Severity Severity of abnormal movements (highest score from questions above): None, normal Incapacitation due to abnormal movements: None, normal Patient's awareness of abnormal movements (rate only patient's report): No Awareness, Dental Status Current problems with teeth and/or dentures?: No Does patient usually wear dentures?: No  CIWA:    COWS:  COWS Total Score: 4  Musculoskeletal: Strength & Muscle Tone: within normal limits Gait & Station: normal Patient leans: N/A  Psychiatric Specialty Exam:  Presentation  General Appearance: Casual; Disheveled  Eye Contact:Fair  Speech:Clear and Coherent; Normal Rate  Speech Volume:Normal  Handedness:Right   Mood and Affect  Mood:mildly anxious appearing  Affect:congruent   Thought Process  Thought Processes:Coherent; Goal Directed  Descriptions of Associations:Intact  Orientation:Full (Time, Place and Person)  Thought Content:Logical - no evidence of acute psychosis, delusions, or paranoia on exam  History of Schizophrenia/Schizoaffective disorder:No data recorded Duration of Psychotic Symptoms:No data recorded Hallucinations:Hallucinations: None  Ideas of Reference:None  Suicidal Thoughts:Suicidal Thoughts:  No  Homicidal Thoughts:Homicidal Thoughts: No   Sensorium  Memory:Immediate Fair; Recent Fair  Judgment:Poor  Insight:Poor   Executive Functions  Concentration:Fair  Attention Span:Fair  Recall:Fair  Fund of Knowledge:Fair  Language:Fair   Psychomotor Activity  Psychomotor Activity:Normal   Assets  Assets:Desire for Improvement; Resilience   Sleep  Sleep:Sleep: Good Number of Hours of Sleep: 6.25    Physical Exam: Physical Exam Vitals and nursing note reviewed.  Constitutional:      General: He is not in acute distress.    Appearance: Normal appearance. He is normal weight. He is not ill-appearing or toxic-appearing.  HENT:     Head: Normocephalic and atraumatic.  Cardiovascular:     Rate and Rhythm: Normal rate.  Pulmonary:     Effort: Pulmonary effort is normal.  Musculoskeletal:        General: Normal range of motion.  Neurological:     Mental Status: He  is alert.   Review of Systems  Constitutional:  Negative for fever.  Respiratory:  Negative for cough and shortness of breath.   Cardiovascular:  Negative for chest pain.  Gastrointestinal:  Negative for abdominal pain, constipation, diarrhea, nausea and vomiting.  Neurological:  Negative for weakness and headaches.  Psychiatric/Behavioral:  Negative for depression, hallucinations and suicidal ideas. The patient is not nervous/anxious.   Blood pressure (!) 126/96, pulse 64, temperature 97.8 F (36.6 C), temperature source Oral, resp. rate 16, height 5\' 3"  (1.6 m), weight 67.8 kg, SpO2 100 %. Body mass index is 26.48 kg/m.   Treatment Plan Summary: Daily contact with patient to assess and evaluate symptoms and progress in treatment   Joshua Thomas is a 45 yr old male who presented after an attempted overdose with continued SI. PPH is significant for MDD, Suicide Attempt via Overdose, Polysubstance Abuse (Meth, Cocaine, Opiate, Benzo), hospitalized at [facility] 2017.     He has responded well to  the starting of the medications.  With improvement in his symptoms we will not make any medication changes at this time.  Social work continues to assist him in placement at either the [facility] program or 2018 residential.  Encouraged him to attend all groups and not stay in bed all day.  If this continues may need to place a lockout order on his room during groups.  We will continue to monitor.     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

## 2020-08-13 NOTE — Progress Notes (Signed)
Recreation Therapy Notes  Date: 8.3.22 Time: 0930 Location: 300 Hall Dayroom  Group Topic: Stress Management   Goal Area(s) Addresses:  Patient will actively participate in stress management techniques presented during session.  Patient will successfully identify benefit of practicing stress management post d/c.   Intervention: Guided exercise with ambient sound and script  Activity :Guided Imagery  LRT read a script that focused on finding peace and relaxation while envisioning being on the beach at sunrise. Patients were given suggestions of ways to access scripts post d/c and encouraged to explore Youtube and other apps available on smartphones, tablets, and computers.   Education:  Stress Management, Discharge Planning.   Education Outcome: Acknowledges education  Clinical Observations/Feedback: Patient did not attend group session.     Caroll Rancher, LRT/CTRS         Lillia Abed, Jessice Madill A 08/13/2020 11:59 AM

## 2020-08-13 NOTE — Plan of Care (Signed)
Nurse discussed coping skills with patient.  

## 2020-08-13 NOTE — Progress Notes (Signed)
D:  Patient denied SI and HI, contracts for safety.  Denied A/V hallucinations.  Denied pain. A:  Medications administered per MD orders.  Emotional support and encouragement given patient. R:  Safety maintained with 15 minute checks.  

## 2020-08-13 NOTE — Progress Notes (Signed)
   08/13/20 2141  Psych Admission Type (Psych Patients Only)  Admission Status Voluntary  Psychosocial Assessment  Patient Complaints Anhedonia  Eye Contact Fair  Facial Expression Animated;Anxious  Affect Appropriate to circumstance  Speech Logical/coherent  Interaction Assertive  Motor Activity Other (Comment) (WDL)  Appearance/Hygiene In scrubs  Behavior Characteristics Anxious;Cooperative  Mood Anxious  Thought Process  Coherency WDL  Content WDL  Delusions None reported or observed  Perception WDL  Hallucination None reported or observed  Judgment Poor  Confusion None  Danger to Self  Current suicidal ideation? Denies  Self-Injurious Behavior No self-injurious ideation or behavior indicators observed or expressed   Agreement Not to Harm Self Yes  Description of Agreement Verbal contract  Danger to Others  Danger to Others None reported or observed

## 2020-08-13 NOTE — Progress Notes (Signed)
  Pt reports moderate anxiety and depression.  Pt states, "stressing over the unknown of where I'm going after this."  Pt states, "I need help with life, help with taking meds."  Pt states, "I am hoping to to to [facility] a program his nephew went to that is good."  Pt seen interacting in day room with peers.  Pt reports thoughts of suicide earlier today while lying in bed stating, "God just take me out of here."  Pt denies SI/HI at present and verbally contracts for safety.  Pt denies AVH.     08/12/20 2125  Psych Admission Type (Psych Patients Only)  Admission Status Voluntary  Psychosocial Assessment  Patient Complaints Anxiety;Depression;Worrying  Eye Contact Fair  Facial Expression Anxious  Affect Appropriate to circumstance  Speech Logical/coherent  Interaction Assertive  Motor Activity Other (Comment) (wnl)  Appearance/Hygiene In scrubs  Behavior Characteristics Cooperative;Appropriate to situation  Mood Anxious  Thought Process  Coherency WDL  Content WDL  Delusions None reported or observed  Perception WDL  Hallucination None reported or observed  Judgment Poor  Confusion None  Danger to Self  Current suicidal ideation? Passive  Self-Injurious Behavior No self-injurious ideation or behavior indicators observed or expressed   Agreement Not to Harm Self Yes  Description of Agreement Pt verbally contracts for safety  Danger to Others  Danger to Others None reported or observed

## 2020-08-13 NOTE — [facility] Group Notes (Signed)
[facility] LCSW Group Therapy   08/13/2020 2:56 PM  Type of Therapy and Topic:  Group Therapy:  Strengths Exploration   Participation Level: Active  Description of Group: This group allows individuals to explore their strengths, learn to use strengths in new ways to improve well-being. Strengths-based interventions involve identifying strengths, understanding how they are used, and learning new ways to apply them. Individuals will identify their strengths, and then explore their roles in different areas of life (relationships, professional life, and personal fulfillment). Individuals will think about ways in which they currently use their strengths, along with new ways they could begin using them.    Therapeutic Goals Patient will verbalize two of their strengths Patient will identify how their strengths are currently used Patient will identify two new ways to apply their strengths  Patients will create a plan to apply their strengths in their daily lives     Summary of Patient Progress:  Pt attended group, participated, and was appropriate during discussion.     Therapeutic Modalities Cognitive Behavioral Therapy Motivational Interviewing   Fredirick Lathe, LCSWA Clinicial Social Worker [employer]

## 2020-08-13 NOTE — [facility] Counselor (Signed)
CSW spoke with pt's P.O. Officer [place] who stated that he is okay with pt handling his warrants once he discharges from residential treatment. He would like to be updated as discharge plans progress.  Joshua Thomas, LCSWA Clinicial Social Worker [employer]

## 2020-08-13 NOTE — Progress Notes (Signed)
Psychoeducational Group Note  Date:  08/13/2020 Time:  2055  Group Topic/Focus  Wrap-Up Group:   The focus of this group is to help patients review their daily goal of treatment and discuss progress on daily workbooks.  Participation Level: Did Not Attend  Participation Quality:  Not Applicable  Affect:  Not Applicable  Cognitive:  Not Applicable  Insight:  Not Applicable  Engagement in Group: Not Applicable  Additional Comments:  The patient did not attend group.  Hazle Coca S 08/13/2020, 8:58 PM

## 2020-08-13 NOTE — Plan of Care (Signed)
  Problem: Education: Goal: Emotional status will improve Outcome: Not Progressing Goal: Mental status will improve Outcome: Not Progressing   Problem: Health Behavior/Discharge Planning: Goal: Identification of resources available to assist in meeting health care needs will improve Outcome: Not Progressing   

## 2020-08-14 ENCOUNTER — Encounter (HOSPITAL_COMMUNITY): Payer: Self-pay | Admitting: Nurse Practitioner

## 2020-08-14 MED ORDER — GABAPENTIN 100 MG PO CAPS
200.0000 mg | ORAL_CAPSULE | Freq: Three times a day (TID) | ORAL | Status: DC
  Administered 2020-08-14 – 2020-08-16 (×7): 200 mg via ORAL
  Filled 2020-08-14 (×3): qty 2
  Filled 2020-08-14: qty 84
  Filled 2020-08-14 (×2): qty 2
  Filled 2020-08-14: qty 84
  Filled 2020-08-14: qty 2
  Filled 2020-08-14: qty 84
  Filled 2020-08-14 (×2): qty 2
  Filled 2020-08-14: qty 84
  Filled 2020-08-14: qty 2
  Filled 2020-08-14: qty 84
  Filled 2020-08-14 (×2): qty 2
  Filled 2020-08-14: qty 84

## 2020-08-14 MED ORDER — SERTRALINE HCL 25 MG PO TABS
25.0000 mg | ORAL_TABLET | Freq: Once | ORAL | Status: AC
  Administered 2020-08-14: 25 mg via ORAL
  Filled 2020-08-14 (×2): qty 1

## 2020-08-14 MED ORDER — SERTRALINE HCL 50 MG PO TABS
50.0000 mg | ORAL_TABLET | Freq: Every day | ORAL | Status: DC
  Administered 2020-08-15 – 2020-08-17 (×3): 50 mg via ORAL
  Filled 2020-08-14 (×2): qty 1
  Filled 2020-08-14 (×3): qty 14
  Filled 2020-08-14: qty 1

## 2020-08-14 NOTE — [facility] Suicide Risk Assessment (Signed)
[facility] INPATIENT:  Family/Significant Other Suicide Prevention Education  Suicide Prevention Education:  Education Completed; Charlene Benefield,  (name of family member/significant other) has been identified by the patient as the family member/significant other with whom the patient will be residing, and identified as the person(s) who will aid the patient in the event of a mental health crisis (suicidal ideations/suicide attempt).  With written consent from the patient, the family member/significant other has been provided the following suicide prevention education, prior to the and/or following the discharge of the patient.  The suicide prevention education provided includes the following: Suicide risk factors Suicide prevention and interventions National Suicide Hotline telephone number [facility] assessment telephone number [facility] Emergency Assistance 911 [facility] and/or Residential Mobile Crisis Unit telephone number  Request made of family/significant other to: Remove weapons (e.g., guns, rifles, knives), all items previously/currently identified as safety concern.   Remove drugs/medications (over-the-counter, prescriptions, illicit drugs), all items previously/currently identified as a safety concern.  The family member/significant other verbalizes understanding of the suicide prevention education information provided.  The family member/significant other agrees to remove the items of safety concern listed above.  "He has an addiction and he thinks he can handle it on his own. This has been going on for a number of years. He has been living on the streets for years and I never know where he is going to be. I hope and pray he can get into a program from there. He doesn't need to go to the shelter."  -No Access to Weapons  Felizardo Hoffmann 08/14/2020, 1:51 PM

## 2020-08-14 NOTE — Progress Notes (Addendum)
[facility] MD Progress Note  08/14/2020 10:12 AM Joshua Thomas  MRN:  [id number] Subjective:   Joshua Thomas is a 45 yr old male who presented after an attempted overdose. PPH is significant for MDD, Suicide Attempt via Overdose, Polysubstance Abuse (Meth, Cocaine, Opiate, Benzo), hospitalized at [facility] 2017.   He reports that he is doing okay today.  He reports that he was able to sleep all the way through the night which is a large improvement for him.  He reports that his appetite has been fine.  He reports no SI, HI, or AVH.  He reports that he did get out of his bed yesterday and did attend some of the groups.  Praised him for this and encouraged him to continue to attend groups.  Discussed that residential programs look for these milestones to show patient's commitment to programs.  He reports that his anxiety is still fairly significant specifically regarding his ability to get excepted into a residential treatment program.  He also reports that he still having significant restless leg movement when he is trying to sleep.  Discussed with him increasing his Zoloft and gabapentin which she was in agreement with.  He reports that he did successfully complete the application forms yesterday and handed back to social work.  Discussed with him that it can sometimes take a while to get a response to these programs often taking a few days.  He reported understanding with this.  He has no other concerns at present.   Principal Problem: MDD (major depressive disorder), recurrent episode, severe (HCC) Diagnosis: Principal Problem:   MDD (major depressive disorder), recurrent episode, severe (HCC) Active Problems:   Polysubstance abuse (HCC)   Substance induced mood disorder (HCC)   Suicidal ideation   Opioid abuse (HCC)   Amphetamine abuse (HCC)  Total Time spent with patient: 30 minutes  Past Psychiatric History: MDD, Suicide Attempt via Overdose, Polysubstance Abuse (Meth, Cocaine, Opiate, Benzo),  hospitalized at [facility] 2017.  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   COPD (chronic obstructive pulmonary disease) (HCC)    DDD (degenerative disc disease), lumbar    Degenerative disc disease, lumbar    Depression    Discitis    GERD (gastroesophageal reflux disease)    Hepatitis C    Hepatitis C    History of stomach ulcers    Hx of diabetes mellitus    due to infection   Hypertension    Lung nodules    3 on L and 2 on R   PTSD (post-traumatic stress disorder)    Sciatica    Substance abuse (HCC)     Past Surgical History:  Procedure Laterality Date   FRACTURE SURGERY     HARDWARE REMOVAL  01/27/2012   Procedure: HARDWARE REMOVAL;  Surgeon: Kathryne Hitch, MD;  Location: [facility] ORS;  Service: Orthopedics;  Laterality: Left;   IR LUMBAR DISC ASPIRATION W/IMG GUIDE  06/08/2017   RADIOLOGY WITH ANESTHESIA N/A 06/08/2017   Procedure: DISC ASPIRATION;  Surgeon: Julieanne Cotton, MD;  Location: [facility] OR;  Service: Radiology;  Laterality: N/A;   TIBIA IM NAIL INSERTION  01/27/2012   Procedure: INTRAMEDULLARY (IM) NAIL TIBIAL;  Surgeon: Kathryne Hitch, MD;  Location: [facility];  Service: Orthopedics;  Laterality: Left;  Removal of IM Rod and Screws Left Tibia with Exchange Nail, Allograft Bone Graft Left Tibia   Family History:  Family History  Problem Relation Age of Onset   Diabetes Mother    Alcohol abuse Father    Cirrhosis Father    Aneurysm Father    Family Psychiatric  History: Father- EtOH abuse Social History:  Social History   Substance and Sexual Activity  Alcohol Use Yes   Comment: occasionally     Social History   Substance and Sexual Activity  Drug Use Yes   Types: Cocaine, IV, Marijuana, Methamphetamines   Comment: opiates-   none april 2021    Social History   Socioeconomic History   Marital status: Single    Spouse name: Not on file   Number of children: Not on file   Years of education: Not on file   Highest education level: Not on file  Occupational History   Not on file  Tobacco Use   Smoking status: Every Day    Packs/day: 1.00    Years: 33.00    Pack years: 33.00    Types: Cigarettes   Smokeless tobacco: Former    Quit date: 08/08/2006   Tobacco comments:    1 PPD  Vaping Use   Vaping Use: Some days  Substance and Sexual Activity   Alcohol use: Yes    Comment: occasionally   Drug use: Yes    Types: Cocaine, IV, Marijuana, Methamphetamines    Comment: opiates-  none april 2021   Sexual activity: Not Currently  Other Topics Concern   Not on file  Social History Narrative   Not on file   Social Determinants of Health   Financial Resource Strain: Not on file  Food Insecurity: Not on file  Transportation Needs: Not on file  Physical Activity: Not on file  Stress: Not on file  Social Connections: Not on file   Additional Social History:                         Sleep: Good  Appetite:  Good  Current Medications: Current Facility-Administered Medications  Medication Dose Route Frequency Provider Last Rate Last Admin   acetaminophen (TYLENOL) tablet 650 mg  650 mg Oral Q6H PRN Ajibola, Ene A, NP       alum & mag hydroxide-simeth (MAALOX/MYLANTA) 200-200-20 MG/5ML suspension 30 mL  30 mL Oral Q4H PRN Ajibola, Ene A, NP       amLODipine (NORVASC) tablet 5 mg  5 mg Oral Daily Antonieta Pert, MD   5 mg at 08/14/20 0818   [START ON 08/15/2020] cloNIDine (CATAPRES) tablet 0.1 mg  0.1 mg Oral QAC breakfast Ajibola, Ene A, NP       dicyclomine (BENTYL) tablet 20 mg  20 mg Oral Q6H PRN Ajibola, Ene A, NP   20 mg at 08/11/20 1404   gabapentin (NEURONTIN) capsule 200 mg  200 mg Oral TID Lauro Franklin, MD       hydrOXYzine (ATARAX/VISTARIL) tablet 25 mg  25 mg Oral Q6H PRN  Ajibola, Ene A, NP   25 mg at 08/14/20 8101   loperamide (IMODIUM) capsule 2-4 mg  2-4 mg Oral PRN Ajibola, Ene A, NP       magnesium hydroxide (MILK OF MAGNESIA) suspension 30 mL  30 mL Oral Daily PRN Ajibola, Ene A, NP       methocarbamol (ROBAXIN) tablet 500 mg  500 mg Oral Q8H PRN Ajibola, Ene A, NP   500 mg at 08/14/20 0824   naproxen (NAPROSYN) tablet 500 mg  500 mg Oral BID PRN Ajibola, Ene A, NP   500 mg at 08/13/20 2141   nicotine (NICODERM CQ - dosed in mg/24 hours) patch 21 mg  21 mg Transdermal Daily Ajibola, Ene A, NP   21 mg at 08/14/20 0821   ondansetron (ZOFRAN-ODT) disintegrating tablet 4 mg  4 mg Oral Q6H PRN Ajibola, Ene A, NP       sertraline (ZOLOFT) tablet 25 mg  25 mg Oral Once Lauro Franklin, MD       Melene Muller ON 08/15/2020] sertraline (ZOLOFT) tablet 50 mg  50 mg Oral Daily Pashayan, Mardelle Matte, MD       traZODone (DESYREL) tablet 50 mg  50 mg Oral QHS PRN Ajibola, Ene A, NP   50 mg at 08/13/20 2141    Lab Results:  Results for orders placed or performed during the hospital encounter of 08/09/20 (from the past 48 hour(s))  TSH     Status: None   Collection Time: 08/13/20  6:21 AM  Result Value Ref Range   TSH 1.181 0.350 - 4.500 uIU/mL    Comment: Performed by a 3rd Generation assay with a functional sensitivity of <=0.01 uIU/mL. Performed at [facility], [address] [place], [place] [address]   Comprehensive metabolic panel     Status: Abnormal   Collection Time: 08/13/20  6:21 AM  Result Value Ref Range   Sodium 139 135 - 145 mmol/L   Potassium 4.2 3.5 - 5.1 mmol/L   Chloride 105 98 - 111 mmol/L   CO2 28 22 - 32 mmol/L   Glucose, Bld 173 (H) 70 - 99 mg/dL    Comment: Glucose reference range applies only to samples taken after fasting for at least 8 hours.   BUN 12 6 - 20 mg/dL   Creatinine, Ser 9.56 0.61 - 1.24 mg/dL   Calcium 8.8 (L) 8.9 - 10.3 mg/dL   Total Protein 6.7 6.5 - 8.1 g/dL   Albumin 3.4 (L) 3.5 - 5.0 g/dL   AST 18  15 - 41 U/L   ALT 26 0 - 44 U/L   Alkaline Phosphatase 46 38 - 126 U/L   Total Bilirubin 0.4 0.3 - 1.2 mg/dL   GFR, Estimated >21 >30 mL/min    Comment: (NOTE) Calculated using the CKD-EPI Creatinine Equation (2021)    Anion gap 6 5 - 15    Comment: Performed at [facility], [address] [place], [place] [address]  T4, free     Status: None   Collection Time: 08/13/20  6:21 AM  Result Value Ref Range   Free T4 0.89 0.61 - 1.12 ng/dL    Comment: (NOTE) Biotin ingestion may interfere with free T4 tests. If the results are inconsistent with the TSH level, previous test results, or the clinical presentation, then consider biotin interference. If needed, order repeat testing after stopping biotin. Performed at [facility], [address], [place], [place] [address]     Blood Alcohol level:  Lab Results  Component Value Date   [facility] <10 05/10/2019   ETH <10 04/26/2019    Metabolic Disorder Labs: Lab Results  Component Value Date   HGBA1C 6.6 (H) 06/13/2017   MPG 142.72 06/13/2017   MPG 120 05/23/2015   Lab Results  Component Value Date   PROLACTIN 31.3 (H) 05/23/2015  Lab Results  Component Value Date   CHOL 152 05/23/2015   TRIG 191 (H) 05/23/2015   HDL 27 (L) 05/23/2015   CHOLHDL 5.6 05/23/2015   VLDL 38 05/23/2015   LDLCALC 87 05/23/2015    Physical Findings: AIMS: Facial and Oral Movements Muscles of Facial Expression: None, normal Lips and Perioral Area: None, normal Jaw: None, normal Tongue: None, normal,Extremity Movements Upper (arms, wrists, hands, fingers): None, normal Lower (legs, knees, ankles, toes): None, normal, Trunk Movements Neck, shoulders, hips: None, normal, Overall Severity Severity of abnormal movements (highest score from questions above): None, normal Incapacitation due to abnormal movements: None, normal Patient's awareness of abnormal movements (rate only patient's report): No Awareness, Dental  Status Current problems with teeth and/or dentures?: No Does patient usually wear dentures?: No  CIWA:    COWS:  COWS Total Score: 2  Musculoskeletal: Strength & Muscle Tone: within normal limits Gait & Station: normal Patient leans: N/A  Psychiatric Specialty Exam:  Presentation  General Appearance: Casual; Appropriate for Environment  Eye Contact:Good  Speech:Clear and Coherent; Normal Rate  Speech Volume:Normal  Handedness:Right   Mood and Affect  Mood:Anxious  Affect: congruent   Thought Process  Thought Processes:Coherent; Goal Directed  Descriptions of Associations:Intact  Orientation:Full (Time, Place and Person)  Thought Content:Logical - denies AVH,paranoia, or delusions and no acute psychosis on exam  History of Schizophrenia/Schizoaffective disorder:No data recorded Duration of Psychotic Symptoms:No data recorded Hallucinations:Hallucinations: None  Ideas of Reference:None  Suicidal Thoughts:Suicidal Thoughts: No  Homicidal Thoughts:Homicidal Thoughts: No   Sensorium  Memory:Immediate Fair; Recent Fair  Judgment:Fair  Insight:Fair   Executive Functions  Concentration:Fair  Attention Span:Fair  Recall:Fair  Fund of Knowledge:Fair  Language:Good   Psychomotor Activity  Psychomotor Activity:Normal    Assets  Assets:Desire for Improvement; Resilience   Sleep  Sleep:Sleep: Good Number of Hours of Sleep: 6.75    Physical Exam: Physical Exam Vitals and nursing note reviewed.  Constitutional:      General: He is not in acute distress.    Appearance: Normal appearance. He is normal weight. He is not ill-appearing or toxic-appearing.  HENT:     Head: Normocephalic and atraumatic.  Cardiovascular:     Rate and Rhythm: Normal rate.  Pulmonary:     Effort: Pulmonary effort is normal.  Musculoskeletal:        General: Normal range of motion.  Neurological:     Mental Status: He is alert.   Review of Systems   Constitutional:  Negative for fever.  Respiratory:  Negative for cough and shortness of breath.   Cardiovascular:  Negative for chest pain.  Gastrointestinal:  Negative for abdominal pain, constipation, diarrhea, nausea and vomiting.  Neurological:  Negative for weakness and headaches.  Psychiatric/Behavioral:  Negative for depression, hallucinations and suicidal ideas. The patient is nervous/anxious.   Blood pressure (!) 125/91, pulse (!) 57, temperature (!) 97.5 F (36.4 C), temperature source Oral, resp. rate 16, height  (1.6 m), weight 67.8 kg, SpO2 100 %. Body mass index is 26.48 kg/m.   Treatment Plan Summary: Daily contact with patient to assess and evaluate symptoms and progress in treatment   Joshua Thomas is a 45 yr old male who presented initially after an attempted overdose with continued SI. PPH is significant for MDD, Suicide Attempt via Overdose, Polysubstance Abuse (Meth, Cocaine, Opiate, Benzo), hospitalized at [facility] 2017.     Patient continues to have some issues with his anxiety and restless leg.  We will increase his Zoloft and gabapentin  today.  His application for the [facility] program and [facility] residential were resent out.  Social work will continue to assist in placement.  He is integrating into the milieu better.  His clonidine taper is scheduled for last dose on Saturday 8/6.  We will continue to monitor.     MDD, Recurrent, Severe: -Increase Zoloft to 50 mg daily      Opiate and Stimulant use d/o - amphetamine type: -Continue scheduled Clonidine taper but monitoring HR (hold dose for HR<60) -Continue Bentyl 20 mg q6 PRN -Continue Robaxin 500 mg q8 PRN -Continue Naproxen 500 mg BID PRN -Continue Zofran-ODT 4 mg q6 PRN -Continue Imodium 2mg  PRN -Continue Vistaril 25mg  q6 hours PRN anxiety -Increase Gabapentin to 200 mg TID     Nicotine Dependence: -Continue Nicotine patch 21 mg daily     HTN: -Continue Amlodipine 5 mg daily and monitor BP      -Continue PRN's: Tylenol, Maalox, Atarax, Milk of Magnesia, Trazodone    Lauro FranklinAlexander S Pashayan, MD 08/14/2020, 10:12 AM

## 2020-08-14 NOTE — Progress Notes (Signed)
   08/14/20 2140  Psych Admission Type (Psych Patients Only)  Admission Status Voluntary  Psychosocial Assessment  Patient Complaints Anxiety;Depression  Eye Contact Fair  Facial Expression Animated;Anxious  Affect Appropriate to circumstance  Speech Logical/coherent  Interaction Assertive  Motor Activity Other (Comment) (WDL)  Appearance/Hygiene Unremarkable  Behavior Characteristics Cooperative;Anxious  Mood Depressed;Anxious  Thought Process  Coherency WDL  Content WDL  Delusions None reported or observed  Perception WDL  Hallucination None reported or observed  Judgment Poor  Confusion None  Danger to Self  Current suicidal ideation? Denies  Self-Injurious Behavior No self-injurious ideation or behavior indicators observed or expressed   Agreement Not to Harm Self Yes  Description of Agreement Verbal contract  Danger to Others  Danger to Others None reported or observed

## 2020-08-14 NOTE — Progress Notes (Signed)
   08/14/20 0634  Vital Signs  Pulse Rate (!) 57  BP (!) 125/91  BP Location Right Arm  BP Method Automatic  Patient Position (if appropriate) Sitting   D: Patient denies SI/HI/AVH. Pt. Rated anxiety 8/10 and depression 7/10. Pt. Isolated in room A:  Patient took scheduled medicine. Pt. Given 25 mg of vistaril for anxiety and 500 mg of Robaxin for muscle spasms in back.   Support and encouragement provided Routine safety checks conducted every 15 minutes. Patient  Informed to notify staff with any concerns.   R:  Safety maintained.

## 2020-08-14 NOTE — [facility] Counselor (Signed)
Pt has been accepted to [facility] for Monday 8/8 for 9am.  CSW will receive a call on a decision for the [facility] program later this afternoon.   Pt has been informed and is very excited.   Fredirick Lathe, LCSWA Clinicial Social Worker [employer]

## 2020-08-14 NOTE — Progress Notes (Signed)
[facility] Group Notes:  (Nursing/MHT/Case Management/Adjunct)  Date:  08/14/2020  Time:  2015  Type of Therapy:   wrap up group  Participation Level:  Active  Participation Quality:  Appropriate, Inattentive, Redirectable, and Sharing  Affect:  Appropriate  Cognitive:  Appropriate  Insight:  Improving  Engagement in Group:  Distracting  Modes of Intervention:  Clarification, Education, and Support  Summary of Progress/Problems: Positive thinking and self-care were discussed.   Johann Capers S 08/14/2020, 9:03 PM

## 2020-08-15 ENCOUNTER — Encounter (HOSPITAL_COMMUNITY): Payer: Self-pay | Admitting: Nurse Practitioner

## 2020-08-15 MED ORDER — DEXAMETHASONE SODIUM PHOSPHATE 10 MG/ML IJ SOLN
5.0000 mg | Freq: Once | INTRAMUSCULAR | Status: AC
  Administered 2020-08-15: 5 mg via INTRAMUSCULAR
  Filled 2020-08-15: qty 1

## 2020-08-15 MED ORDER — NAPROXEN 500 MG PO TABS
500.0000 mg | ORAL_TABLET | Freq: Two times a day (BID) | ORAL | Status: DC | PRN
  Administered 2020-08-16 – 2020-08-17 (×3): 500 mg via ORAL
  Filled 2020-08-15 (×3): qty 1
  Filled 2020-08-15: qty 20

## 2020-08-15 MED ORDER — DOXYCYCLINE HYCLATE 100 MG PO CAPS: 100.0000 mg | ORAL_CAPSULE | Freq: Two times a day (BID) | ORAL | 0 refills | Status: DC

## 2020-08-15 MED ORDER — DOXYCYCLINE HYCLATE 100 MG PO TABS
100.0000 mg | ORAL_TABLET | Freq: Once | ORAL | Status: AC
  Administered 2020-08-15: 100 mg via ORAL
  Filled 2020-08-15: qty 1

## 2020-08-15 MED ORDER — LIDOCAINE-EPINEPHRINE 2 %-1:100000 IJ SOLN
20.0000 mL | Freq: Once | INTRAMUSCULAR | Status: AC
  Administered 2020-08-15: 20 mL via INTRADERMAL
  Filled 2020-08-15: qty 1

## 2020-08-15 MED ORDER — KETOROLAC TROMETHAMINE 60 MG/2ML IM SOLN
60.0000 mg | Freq: Once | INTRAMUSCULAR | Status: AC
  Administered 2020-08-15: 60 mg via INTRAMUSCULAR
  Filled 2020-08-15: qty 2

## 2020-08-15 MED ORDER — HYDROXYZINE HCL 25 MG PO TABS
25.0000 mg | ORAL_TABLET | Freq: Four times a day (QID) | ORAL | Status: DC | PRN
  Filled 2020-08-15: qty 20

## 2020-08-15 MED ORDER — METHOCARBAMOL 500 MG PO TABS
500.0000 mg | ORAL_TABLET | Freq: Three times a day (TID) | ORAL | Status: DC | PRN
  Administered 2020-08-15 – 2020-08-17 (×5): 500 mg via ORAL
  Filled 2020-08-15 (×4): qty 1
  Filled 2020-08-15: qty 20
  Filled 2020-08-15: qty 1

## 2020-08-15 MED ORDER — DICYCLOMINE HCL 20 MG PO TABS: 20.0000 mg | ORAL_TABLET | Freq: Four times a day (QID) | ORAL | Status: DC | PRN

## 2020-08-15 MED ORDER — LOPERAMIDE HCL 2 MG PO CAPS: 2.0000 mg | ORAL_CAPSULE | ORAL | Status: DC | PRN

## 2020-08-15 MED ORDER — ONDANSETRON 4 MG PO TBDP: 4.0000 mg | ORAL_TABLET | Freq: Four times a day (QID) | ORAL | Status: DC | PRN

## 2020-08-15 NOTE — ED Provider Notes (Signed)
Emergency Medicine Provider Triage Evaluation Note  [name] , a 45 y.o. male  was evaluated in triage.  Pt complains of ingrown hair at the base of neck. Has had it four the past couple of days. No fevers. Pt states that he is from [facility].   Review of Systems  Positive: Ingrown hair  Negative: fevers  Physical Exam  BP (!) 120/92 (BP Location: Left Arm)   Pulse 80   Temp 98.3 F (36.8 C) (Oral)   Resp 18   Ht 5\' 3"  (1.6 m)   Wt 67.8 kg   SpO2 99%   BMI 26.48 kg/m  Gen:   Awake, no distress   Resp:  Normal effort  MSK:   Moves extremities without difficulty  Other:  Lesion to base of hairline of posterior neck  Medical Decision Making  Medically screening exam initiated at 2:25 PM.  Appropriate orders placed.  was informed that the remainder of the evaluation will be completed by another provider, this initial triage assessment does not replace that evaluation, and the importance of remaining in the ED until their evaluation is complete.     Naida Sleight, PA-C 08/15/20 1427    10/15/20, MD 08/19/20 1330

## 2020-08-15 NOTE — Progress Notes (Signed)
   08/15/20 0625  Vital Signs  Temp 98 F (36.7 C)  Temp Source Oral  Pulse Rate 74  Resp 20  BP (!) 131/95  BP Location Left Arm  BP Method Automatic  Patient Position (if appropriate) Standing  Oxygen Therapy  SpO2 100 %   D: Patient denies SI/HI/AVH. Patient denies both anxiety and depression. Pt. Complained about a large bump, cyst like boil on the right back of patients neck. Pt reported that it was painful. Doctors notified. Doctor ordered pt. To have bump/cyst like boil looked and treated in the ED.  A:  Patient took scheduled medicine.  Support and encouragement provided Routine safety checks conducted every 15 minutes. Patient  Informed to notify staff with any concerns.   R: Safety maintained.

## 2020-08-15 NOTE — Progress Notes (Addendum)
[facility] MD Progress Note  08/15/2020 12:38 PM Joshua Thomas  MRN:  [id number] Subjective:   Joshua Thomas is a 45 yr old male who presented after an attempted overdose. PPH is significant for MDD, Suicide Attempt via Overdose, Polysubstance Abuse (Meth, Cocaine, Opiate, Benzo), hospitalized at [facility] 2017.   He reports that he is doing just okay today.  He reports that he is very excited about the news that he does have a bed offering at [facility].  However, he reports that since yesterday a new mass has appeared on the back of his head at the base of his neck on the right side.  He reports that in the past he has had ingrown hairs that have been similar.  He reports however that this mass is quite painful to the touch and limiting his range of motion of his neck without pain.  He reports that because of this he did not sleep that well last night.  He also reports that yesterday at dinnertime he did have a brief period where he was unable to swallow the food that he was chewing and had a choking episode.  He reports that he successfully coughed up the food and then afterwards had no further trouble swallowing but was concerned about this.  He reports that his appetite has done well.  He reports no SI, HI, or AVH.  He reports no issues with his medications.  Discussed with him that given the intensity of the pain and the limit to the range of motion of his neck that he would need to be sent to [facility] long ED for incision and drainage, wound care as needed, culturing, and starting of antibiotics.  He was understanding of this plan and agreeable to it.  He had no other concerns at present.   Principal Problem: MDD (major depressive disorder), recurrent episode, severe (HCC) Diagnosis: Principal Problem:   MDD (major depressive disorder), recurrent episode, severe (HCC) Active Problems:   Polysubstance abuse (HCC)   Substance induced mood disorder (HCC)   Suicidal ideation   Opioid abuse (HCC)   Amphetamine  abuse (HCC)  Total Time spent with patient: 30 minutes  Past Psychiatric History: MDD, Suicide Attempt via Overdose, Polysubstance Abuse (Meth, Cocaine, Opiate, Benzo), hospitalized at [facility] 2017.  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   COPD (chronic obstructive pulmonary disease) (HCC)    DDD (degenerative disc disease), lumbar    Degenerative disc disease, lumbar    Depression    Discitis    GERD (gastroesophageal reflux disease)    Hepatitis C    Hepatitis C    History of stomach ulcers    Hx of diabetes mellitus    due to infection   Hypertension    Lung nodules    3 on L and 2 on R   PTSD (post-traumatic stress disorder)    Sciatica    Substance abuse (HCC)     Past Surgical History:  Procedure Laterality Date   FRACTURE SURGERY     HARDWARE REMOVAL  01/27/2012   Procedure: HARDWARE REMOVAL;  Surgeon: Kathryne Hitch, MD;  Location: [facility];  Service: Orthopedics;  Laterality: Left;   IR LUMBAR DISC ASPIRATION W/IMG GUIDE  06/08/2017   RADIOLOGY WITH ANESTHESIA N/A 06/08/2017   Procedure: DISC ASPIRATION;  Surgeon: Julieanne Cottoneveshwar, Sanjeev, MD;  Location: [facility];  Service: Radiology;  Laterality: N/A;   TIBIA IM NAIL INSERTION  01/27/2012   Procedure: INTRAMEDULLARY (IM) NAIL TIBIAL;  Surgeon: Kathryne Hitchhristopher Y Blackman, MD;  Location: [facility];  Service: Orthopedics;  Laterality: Left;  Removal of IM Rod and Screws Left Tibia with Exchange Nail, Allograft Bone Graft Left Tibia   Family History:  Family History  Problem Relation Age of Onset   Diabetes Mother    Alcohol abuse Father    Cirrhosis Father    Aneurysm Father    Family Psychiatric  History: Father- EtOH abuse Social History:  Social History   Substance and Sexual Activity  Alcohol Use  Yes   Comment: occasionally     Social History   Substance and Sexual Activity  Drug Use Yes   Types: Cocaine, IV, Marijuana, Methamphetamines   Comment: opiates-  none april 2021    Social History   Socioeconomic History   Marital status: Single    Spouse name: Not on file   Number of children: Not on file   Years of education: Not on file   Highest education level: Not on file  Occupational History   Not on file  Tobacco Use   Smoking status: Every Day    Packs/day: 1.00    Years: 33.00    Pack years: 33.00    Types: Cigarettes   Smokeless tobacco: Former    Quit date: 08/08/2006   Tobacco comments:    1 PPD  Vaping Use   Vaping Use: Some days  Substance and Sexual Activity   Alcohol use: Yes    Comment: occasionally   Drug use: Yes    Types: Cocaine, IV, Marijuana, Methamphetamines    Comment: opiates-  none april 2021   Sexual activity: Not Currently  Other Topics Concern   Not on file  Social History Narrative   Not on file   Social Determinants of Health   Financial Resource Strain: Not on file  Food Insecurity: Not on file  Transportation Needs: Not on file  Physical Activity: Not on file  Stress: Not on file  Social Connections: Not on file   Additional Social History:                         Sleep: Fair  Appetite:  Fair  Current Medications: Current Facility-Administered Medications  Medication Dose Route Frequency Provider Last Rate Last Admin   acetaminophen (TYLENOL) tablet 650 mg  650 mg Oral Q6H PRN Ajibola, Ene A, NP       alum & mag hydroxide-simeth (MAALOX/MYLANTA) 200-200-20 MG/5ML suspension 30 mL  30 mL Oral Q4H PRN Ajibola, Ene A, NP       amLODipine (NORVASC) tablet 5 mg  5 mg Oral Daily Antonieta Pertlary, Greg Lawson, MD   5 mg at 08/15/20 0740   cloNIDine (CATAPRES) tablet 0.1 mg  0.1 mg Oral QAC breakfast Mason JimSingleton, Tahsin Benyo E, MD   0.1 mg at 08/15/20 0740   dicyclomine (BENTYL) tablet 20 mg  20 mg Oral Q6H PRN Mason JimSingleton, Percilla Tweten E, MD        gabapentin (NEURONTIN) capsule 200 mg  200 mg Oral TID Lauro FranklinPashayan, Alexander S, MD   200 mg at 08/15/20 0740   hydrOXYzine (ATARAX/VISTARIL) tablet 25 mg  25 mg Oral Q6H PRN Comer LocketSingleton, Flecia Shutter E, MD  loperamide (IMODIUM) capsule 2-4 mg  2-4 mg Oral PRN Mason Jim, Peony Barner E, MD       magnesium hydroxide (MILK OF MAGNESIA) suspension 30 mL  30 mL Oral Daily PRN Ajibola, Ene A, NP       methocarbamol (ROBAXIN) tablet 500 mg  500 mg Oral Q8H PRN Mason Jim, Robley Matassa E, MD       naproxen (NAPROSYN) tablet 500 mg  500 mg Oral BID PRN Comer Locket, MD       nicotine (NICODERM CQ - dosed in mg/24 hours) patch 21 mg  21 mg Transdermal Daily Ajibola, Ene A, NP   21 mg at 08/15/20 0741   ondansetron (ZOFRAN-ODT) disintegrating tablet 4 mg  4 mg Oral Q6H PRN Mason Jim, Shakiara Lukic E, MD       sertraline (ZOLOFT) tablet 50 mg  50 mg Oral Daily Lauro Franklin, MD   50 mg at 08/15/20 0740   traZODone (DESYREL) tablet 50 mg  50 mg Oral QHS PRN Ajibola, Ene A, NP   50 mg at 08/14/20 2230    Lab Results: No results found for this or any previous visit (from the past 48 hour(s)).  Blood Alcohol level:  Lab Results  Component Value Date   ETH <10 05/10/2019   ETH <10 04/26/2019    Metabolic Disorder Labs: Lab Results  Component Value Date   HGBA1C 6.6 (H) 06/13/2017   MPG 142.72 06/13/2017   MPG 120 05/23/2015   Lab Results  Component Value Date   PROLACTIN 31.3 (H) 05/23/2015   Lab Results  Component Value Date   CHOL 152 05/23/2015   TRIG 191 (H) 05/23/2015   HDL 27 (L) 05/23/2015   CHOLHDL 5.6 05/23/2015   VLDL 38 05/23/2015   LDLCALC 87 05/23/2015    Physical Findings:  Musculoskeletal: Strength & Muscle Tone: within normal limits Gait & Station: normal Patient leans: N/A  Psychiatric Specialty Exam:  Presentation  General Appearance: Appropriate for Environment; Casual  Eye Contact:Good  Speech:Clear and Coherent; Normal Rate  Speech Volume:Normal  Handedness:Right   Mood  and Affect  Mood:Euthymic (he is both anxious about his neck but happy about being accepted to Rehab)  Affect:Appropriate   Thought Process  Thought Processes:Coherent; Goal Directed  Descriptions of Associations:Intact  Orientation:Full (Time, Place and Person)  Thought Content:Logical - no evidence of delusions, paranoia, or acute psychosis on exam  History of Schizophrenia/Schizoaffective disorder:No data recorded Duration of Psychotic Symptoms:No data recorded Hallucinations:Hallucinations: None  Ideas of Reference:None  Suicidal Thoughts:Suicidal Thoughts: No  Homicidal Thoughts:Homicidal Thoughts: No   Sensorium  Memory:Immediate Fair; Recent Fair  Judgment:Fair  Insight:Fair   Executive Functions  Concentration:Fair  Attention Span:Fair  Recall:Fair  Fund of Knowledge:Fair  Language:Good   Psychomotor Activity  Psychomotor Activity:Psychomotor Activity: Normal   Assets  Assets:Desire for Improvement; Resilience   Sleep  Sleep:Sleep: Fair Number of Hours of Sleep: 5.25    Physical Exam: Physical Exam Vitals and nursing note reviewed.  Constitutional:      General: He is not in acute distress.    Appearance: Normal appearance. He is normal weight. He is not ill-appearing or toxic-appearing.  HENT:     Head: Normocephalic and atraumatic.  Neck:   Pulmonary:     Effort: Pulmonary effort is normal.  Musculoskeletal:        General: Normal range of motion.     Cervical back: Erythema and rigidity present. Pain with movement present.  Neurological:     General: No focal deficit  present.     Mental Status: He is alert.   Review of Systems  Respiratory:  Negative for cough and shortness of breath.   Cardiovascular:  Negative for chest pain.  Gastrointestinal:  Negative for abdominal pain, constipation, diarrhea, nausea and vomiting.  Musculoskeletal:  Positive for neck pain.  Neurological:  Negative for weakness and headaches.   Psychiatric/Behavioral:  Negative for depression, hallucinations and suicidal ideas. The patient is not nervous/anxious.   Blood pressure (!) 131/95, pulse 74, temperature 98 F (36.7 C), temperature source Oral, resp. rate 20, height 5\' 3"  (1.6 m), weight 67.8 kg, SpO2 100 %. Body mass index is 26.48 kg/m.   Treatment Plan Summary: Daily contact with patient to assess and evaluate symptoms and progress in treatment   Mr. Caraway is a 45 yr old male who presented initially after an attempted overdose with continued SI. PPH is significant for MDD, Suicide Attempt via Overdose, Polysubstance Abuse (Meth, Cocaine, Opiate, Benzo), hospitalized at [facility] 2017.     Patient psychiatrically speaking is stable today.  However he has a new physical complaint today in the form of a painful mass on the back of his neck that does have a head to it.  Given the pain and it limiting the range of motion of his neck we will send him to [facility] ED for a possible I&D and to start antibiotics.  We will not make any psychiatric medication changes at this time.  He does have a bed at [facility] residential for Monday so we will plan for discharge then.  We will continue to monitor.     MDD, Recurrent, Severe: -Continue Zoloft 50 mg daily      Opiate and Stimulant use d/o - amphetamine type: -Continue scheduled Clonidine taper but monitoring HR (hold dose for HR<60) -Continue Bentyl 20 mg q6 PRN -Continue Robaxin 500 mg q8 PRN -Continue Naproxen 500 mg BID PRN -Continue Zofran-ODT 4 mg q6 PRN -Continue Imodium 2mg  PRN -Continue Vistaril 25mg  q6 hours PRN anxiety -Continue Gabapentin 200 mg TID     Nicotine Dependence: -Continue Nicotine patch 21 mg daily     HTN: -Continue Amlodipine 5 mg daily and monitor BP     -Continue PRN's: Tylenol, Maalox, Atarax, Milk of Magnesia, Trazodone     Tuesday, MD 08/15/2020, 12:38 PM

## 2020-08-15 NOTE — ED Provider Notes (Signed)
[facility]   CSN: [id number] Arrival date & time: 08/15/20  1401     History Chief Complaint  Patient presents with   mdd   Abscess    Joshua Thomas is a 45 y.o. male who who [facility] who presents emergency department with a chief complaint of infected hair follicle.  He states it is extremely painful.  Is been ongoing for about 4 days and progressively worsening.  He has a history of previous skin infections.  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   COPD (chronic obstructive pulmonary disease) (HCC)    DDD (degenerative disc disease), lumbar    Degenerative disc disease, lumbar    Depression    Discitis    GERD (gastroesophageal reflux disease)    Hepatitis C    Hepatitis C    History of stomach ulcers    Hx of diabetes mellitus    due to infection   Hypertension    Lung nodules    3 on L and 2 on R   PTSD (post-traumatic stress disorder)    Sciatica    Substance abuse [facility])     Patient Active Problem List   Diagnosis Date Noted   MDD (major depressive disorder), recurrent episode, severe (HCC) 08/12/2020   Suicidal ideation 08/12/2020   Opioid abuse (HCC) 08/12/2020   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   Chronic low back pain 09/23/2017   Essential hypertension 09/23/2017   Hyperglycemia 06/03/2017   GERD (gastroesophageal reflux disease) 05/17/2017   Bipolar disorder (HCC) 05/17/2017   Opioid use disorder, moderate, dependence (HCC) 05/23/2015   Polysubstance abuse (HCC) 05/21/2015   Substance induced mood disorder (HCC) 05/21/2015   Chronic hepatitis C virus genotype 1a infection  (HCC)     Past Surgical History:  Procedure Laterality Date   FRACTURE SURGERY     HARDWARE REMOVAL  01/27/2012   Procedure: HARDWARE REMOVAL;  Surgeon: Kathryne Hitch, MD;  Location: [facility];  Service: Orthopedics;  Laterality: Left;   IR LUMBAR DISC ASPIRATION W/IMG GUIDE  06/08/2017   RADIOLOGY WITH ANESTHESIA N/A 06/08/2017   Procedure: DISC ASPIRATION;  Surgeon: Julieanne Cotton, MD;  Location: [facility] OR;  Service: Radiology;  Laterality: N/A;   TIBIA IM NAIL INSERTION  01/27/2012   Procedure: INTRAMEDULLARY (IM) NAIL TIBIAL;  Surgeon: Kathryne Hitch, MD;  Location: [facility];  Service: Orthopedics;  Laterality: Left;  Removal of IM Rod and Screws Left Tibia with Exchange Nail, Allograft Bone Graft Left Tibia       Family History  Problem Relation Age of Onset   Diabetes Mother    Alcohol abuse Father    Cirrhosis Father    Aneurysm Father     Social History   Tobacco Use   Smoking status: Every Day    Packs/day: 1.00    Years: 33.00    Pack years: 33.00    Types: Cigarettes   Smokeless tobacco: Former    Quit date: 08/08/2006   Tobacco comments:    1 PPD  Vaping Use   Vaping Use: Some days  Substance Use Topics   Alcohol use: Yes    Comment: occasionally   Drug use: Yes    Types: Cocaine, IV, Marijuana, Methamphetamines    Comment: opiates-  none april 2021    Home Medications Prior to Admission medications   Medication Sig Start Date End Date Taking? Authorizing Provider  Aspirin-Acetaminophen-Caffeine (GOODY HEADACHE PO) Take by mouth.    [provider]  ibuprofen (ADVIL) 200 MG tablet Take 200 mg by mouth every 6 (six) hours as needed.    [provider]  naloxone [facility](NARCAN) nasal spray 4 mg/0.1 mL Take in case of overdose 08/07/20   Long, Arlyss RepressJoshua G, MD  traZODone (DESYREL) 100 MG tablet Take 100 mg by mouth at bedtime. Patient not taking: No sig reported    [provider]    Allergies    Patient has no known  allergies.  Review of Systems   Review of Systems  Constitutional:  Negative for chills and fever.   Physical Exam Updated Vital Signs BP (!) 146/106   Pulse 80   Temp 98.3 F (36.8 C) (Oral)   Resp 17   Ht 5\' 3"  (1.6 m)   Wt 67 kg   SpO2 100%   BMI 26.17 kg/m   Physical Exam Vitals and nursing note reviewed.  Constitutional:      General: He is not in acute distress.    Appearance: He is well-developed. He is not diaphoretic.  HENT:     Head: Normocephalic and atraumatic.  Eyes:     General: No scleral icterus.    Extraocular Movements: Extraocular movements intact.     Conjunctiva/sclera: Conjunctivae normal.     Pupils: Pupils are equal, round, and reactive to light.  Neck:     Comments: 2.5 erythematous nodule with central crusting, + induration, - fluctuance,  Surrounding posterior lymphadenopathy Cardiovascular:     Rate and Rhythm: Normal rate and regular rhythm.     Heart sounds: Normal heart sounds.  Pulmonary:     Effort: Pulmonary effort is normal. No respiratory distress.     Breath sounds: Normal breath sounds.  Abdominal:     Palpations: Abdomen is soft.     Tenderness: There is no abdominal tenderness.  Musculoskeletal:     Cervical back: Normal range of motion and neck supple.  Skin:    General: Skin is warm and dry.  Neurological:     Mental Status: He is alert.  Psychiatric:        Behavior: Behavior normal.    ED Results / Procedures / Treatments   Labs (all labs ordered are listed, but only abnormal results are displayed) Labs Reviewed  TSH - Abnormal; Notable for the following components:      Result Value   TSH 0.337 (*)    All other components within normal limits  COMPREHENSIVE METABOLIC PANEL - Abnormal; Notable for the following components:   Glucose, Bld 173 (*)    Calcium 8.8 (*)    Albumin 3.4 (*)    All other components within normal limits  VITAMIN B12  FOLATE  IRON AND TIBC  FERRITIN  RETICULOCYTES  HIV-1 RNA  QUANT-NO REFLEX-BLD  TSH  T4, FREE    EKG None  Radiology No results found.  Procedures INCISION AND DRAINAGE  Date/Time: 08/15/2020 10:21 PM Performed by: Arthor CaptainHarris, Beckam Abdulaziz, PA-C Authorized by: Comer LocketSingleton, Amy E, MD   Consent:    Consent obtained:  Verbal   Consent given by:  Patient   Risks discussed:  Bleeding, incomplete drainage, pain and damage to other organs   Alternatives discussed:  No treatment Universal protocol:    Procedure explained and questions answered to patient or proxy's satisfaction: yes     Relevant documents present and verified: yes     Test results available : yes     Imaging studies available: yes     Required blood products, implants, devices, and special equipment available: yes     Site/side marked: yes     Immediately prior to procedure, a time out was called: yes     Patient identity confirmed:  Verbally with patient Location:    Type:  Abscess Pre-procedure details:    Skin preparation:  Betadine Anesthesia:    Anesthesia method:  Local infiltration   Local anesthetic:  Lidocaine 1% WITH epi Procedure type:    Complexity:  Simple Procedure details:    Incision types:  Cruciate   Incision depth:  Subcutaneous   Wound management:  Probed and deloculated, irrigated with saline and extensive cleaning   Drainage:  Bloody   Drainage amount:  Scant   Packing materials:  None Post-procedure details:    Procedure completion:  Tolerated well, no immediate complications   Medications Ordered in ED Medications  acetaminophen (TYLENOL) tablet 650 mg (has no administration in time range)  alum & mag hydroxide-simeth (MAALOX/MYLANTA) 200-200-20 MG/5ML suspension 30 mL (has no administration in time range)  magnesium hydroxide (MILK OF MAGNESIA) suspension 30 mL (has no administration in time range)  traZODone (DESYREL) tablet 50 mg (50 mg Oral Given 08/14/20 2230)  dicyclomine (BENTYL) tablet 20 mg (20 mg Oral Given 08/11/20 1404)  hydrOXYzine  (ATARAX/VISTARIL) tablet 25 mg (25 mg Oral Given 08/14/20 1616)  loperamide (IMODIUM) capsule 2-4 mg (has no administration in time range)  methocarbamol (ROBAXIN) tablet 500 mg (500 mg Oral Given 08/14/20 2140)  naproxen (NAPROSYN) tablet 500 mg (500 mg Oral Given 08/13/20 2141)  ondansetron (ZOFRAN-ODT) disintegrating tablet 4 mg (has no administration in time range)  cloNIDine (CATAPRES) tablet 0.1 mg (0.1 mg Oral Given 08/12/20 1306)    Followed by  cloNIDine (CATAPRES) tablet 0.1 mg (0.1 mg Oral Given 08/14/20 0819)    Followed by  cloNIDine (CATAPRES) tablet 0.1 mg (0.1 mg Oral Given 08/15/20 0740)  nicotine (NICODERM CQ - dosed in mg/24 hours) patch 21 mg (21 mg Transdermal Patch Applied 08/15/20 0741)  amLODipine (NORVASC) tablet 5 mg (5 mg Oral Given 08/15/20 0740)  sertraline (ZOLOFT) tablet 50 mg (50 mg Oral Given 08/15/20 0740)  gabapentin (NEURONTIN) capsule 200 mg (200 mg Oral Given 08/15/20 2136)  dicyclomine (BENTYL) tablet 20 mg (has no administration in time range)  hydrOXYzine (ATARAX/VISTARIL) tablet 25 mg (has no administration in time range)  loperamide (IMODIUM) capsule 2-4 mg (has no administration in time range)  methocarbamol (ROBAXIN) tablet 500 mg (500 mg Oral Given 08/15/20 1315)  naproxen (NAPROSYN) tablet 500 mg (has no administration in time range)  ondansetron (ZOFRAN-ODT) disintegrating tablet 4 mg (has no administration in time range)  ketorolac (TORADOL) injection 60 mg (has no administration in time range)  dexamethasone (DECADRON) injection 5 mg (has no administration in time range)  potassium chloride SA (KLOR-CON) CR tablet 20 mEq (20 mEq Oral Given 08/11/20 0928)  sertraline (ZOLOFT) tablet 25 mg (25 mg Oral Given 08/14/20 1148)  lidocaine-EPINEPHrine (XYLOCAINE W/EPI) 2 %-1:100000 (with pres) injection 20 mL (20 mLs Intradermal Given 08/15/20 2137)    ED Course  I have reviewed the triage vital signs and the  nursing notes.  Pertinent labs & imaging results that were  available during my care of the patient were reviewed by me and considered in my medical decision making (see chart for details).    MDM Rules/Calculators/A&P                           Patient here with folliculitis.  I& D without purulent drainage.  Patient given a dose of steroid and Toradol for pain relief.  He had significant pain relief with the lidocaine injection.  Patient be discharged with doxycycline.  Single dose given here.  Return precautions discussed. Final Clinical Impression(s) / ED Diagnoses Final diagnoses:  None    Rx / DC Orders ED Discharge Orders     None        Arthor Captain, PA-C 08/15/20 2225    Melene Plan, DO 08/15/20 2244

## 2020-08-15 NOTE — [facility] Counselor (Signed)
CSW gave pt printout information on [facility] per his request.   Fredirick Lathe, LCSWA Clinicial Social Worker [employer]

## 2020-08-15 NOTE — [facility] Group Notes (Signed)
Patient did not attend morning orientation group.  

## 2020-08-15 NOTE — [facility] Group Notes (Signed)
Due to the acuity, staffing and complex discharge plans, group was not held. Patient was provided therapeutic worksheets and asked to meet with CSW as needed.   Dandy Lazaro, LCSWA Clinicial Social Worker [facility]  

## 2020-08-15 NOTE — Discharge Instructions (Addendum)
Alternate between motrin and Tylenol for pain control. Get help right away if: You have more redness, swelling, or pain in the affected area. Red streaks are spreading from the affected area. You have a fever.

## 2020-08-15 NOTE — Tx Team (Signed)
Interdisciplinary Treatment and Diagnostic Plan Update  08/15/2020 Time of Session: 9:25am Joshua Thomas MRN: [id number]  Principal Diagnosis: MDD (major depressive disorder), recurrent episode, severe (HCC)  Secondary Diagnoses: Principal Problem:   MDD (major depressive disorder), recurrent episode, severe (HCC) Active Problems:   Polysubstance abuse (HCC)   Substance induced mood disorder (HCC)   Suicidal ideation   Opioid abuse (HCC)   Amphetamine abuse (HCC)   Current Medications:  Current Facility-Administered Medications  Medication Dose Route Frequency Provider Last Rate Last Admin   acetaminophen (TYLENOL) tablet 650 mg  650 mg Oral Q6H PRN Ajibola, Ene A, NP       alum & mag hydroxide-simeth (MAALOX/MYLANTA) 200-200-20 MG/5ML suspension 30 mL  30 mL Oral Q4H PRN Ajibola, Ene A, NP       amLODipine (NORVASC) tablet 5 mg  5 mg Oral Daily Antonieta Pert, MD   5 mg at 08/15/20 0740   cloNIDine (CATAPRES) tablet 0.1 mg  0.1 mg Oral QAC breakfast Bartholomew Crews E, MD   0.1 mg at 08/15/20 0740   dicyclomine (BENTYL) tablet 20 mg  20 mg Oral Q6H PRN Comer Locket, MD       gabapentin (NEURONTIN) capsule 200 mg  200 mg Oral TID Lauro Franklin, MD   200 mg at 08/15/20 0740   hydrOXYzine (ATARAX/VISTARIL) tablet 25 mg  25 mg Oral Q6H PRN Comer Locket, MD       loperamide (IMODIUM) capsule 2-4 mg  2-4 mg Oral PRN Comer Locket, MD       magnesium hydroxide (MILK OF MAGNESIA) suspension 30 mL  30 mL Oral Daily PRN Ajibola, Ene A, NP       methocarbamol (ROBAXIN) tablet 500 mg  500 mg Oral Q8H PRN Mason Jim, Amy E, MD       naproxen (NAPROSYN) tablet 500 mg  500 mg Oral BID PRN Mason Jim, Amy E, MD       nicotine (NICODERM CQ - dosed in mg/24 hours) patch 21 mg  21 mg Transdermal Daily Ajibola, Ene A, NP   21 mg at 08/15/20 0741   ondansetron (ZOFRAN-ODT) disintegrating tablet 4 mg  4 mg Oral Q6H PRN Mason Jim, Amy E, MD       sertraline (ZOLOFT) tablet 50 mg  50 mg  Oral Daily Lauro Franklin, MD   50 mg at 08/15/20 0740   traZODone (DESYREL) tablet 50 mg  50 mg Oral QHS PRN Ajibola, Ene A, NP   50 mg at 08/14/20 2230   PTA Medications: Medications Prior to Admission  Medication Sig Dispense Refill Last Dose   Aspirin-Acetaminophen-Caffeine (GOODY HEADACHE PO) Take by mouth.      [EXPIRED] doxycycline (VIBRAMYCIN) 100 MG capsule Take 1 capsule (100 mg total) by mouth daily for 7 days. 7 capsule 0    ibuprofen (ADVIL) 200 MG tablet Take 200 mg by mouth every 6 (six) hours as needed.      naloxone (NARCAN) nasal spray 4 mg/0.1 mL Take in case of overdose 1 each 0    traZODone (DESYREL) 100 MG tablet Take 100 mg by mouth at bedtime. (Patient not taking: No sig reported)       Patient Stressors: Financial difficulties Health problems Occupational concerns Substance abuse  Patient Strengths: Barrister's clerk for treatment/growth  Treatment Modalities: Medication Management, Group therapy, Case management,  1 to 1 session with clinician, Psychoeducation, Recreational therapy.   Physician Treatment Plan for Primary Diagnosis: MDD (major depressive disorder), recurrent episode,  severe (HCC) Long Term Goal(s): Improvement in symptoms so as ready for discharge   Short Term Goals: Ability to identify changes in lifestyle to reduce recurrence of condition will improve Ability to verbalize feelings will improve Ability to disclose and discuss suicidal ideas Ability to demonstrate self-control will improve Ability to identify and develop effective coping behaviors will improve Ability to maintain clinical measurements within normal limits will improve Compliance with prescribed medications will improve Ability to identify triggers associated with substance abuse/mental health issues will improve  Medication Management: Evaluate patient's response, side effects, and tolerance of medication regimen.  Therapeutic Interventions: 1 to 1  sessions, Unit Group sessions and Medication administration.  Evaluation of Outcomes: Progressing  Physician Treatment Plan for Secondary Diagnosis: Principal Problem:   MDD (major depressive disorder), recurrent episode, severe (HCC) Active Problems:   Polysubstance abuse (HCC)   Substance induced mood disorder (HCC)   Suicidal ideation   Opioid abuse (HCC)   Amphetamine abuse (HCC)  Long Term Goal(s): Improvement in symptoms so as ready for discharge   Short Term Goals: Ability to identify changes in lifestyle to reduce recurrence of condition will improve Ability to verbalize feelings will improve Ability to disclose and discuss suicidal ideas Ability to demonstrate self-control will improve Ability to identify and develop effective coping behaviors will improve Ability to maintain clinical measurements within normal limits will improve Compliance with prescribed medications will improve Ability to identify triggers associated with substance abuse/mental health issues will improve     Medication Management: Evaluate patient's response, side effects, and tolerance of medication regimen.  Therapeutic Interventions: 1 to 1 sessions, Unit Group sessions and Medication administration.  Evaluation of Outcomes: Progressing   RN Treatment Plan for Primary Diagnosis: MDD (major depressive disorder), recurrent episode, severe (HCC) Long Term Goal(s): Knowledge of disease and therapeutic regimen to maintain health will improve  Short Term Goals: Ability to remain free from injury will improve, Ability to verbalize frustration and anger appropriately will improve, Ability to demonstrate self-control, Ability to identify and develop effective coping behaviors will improve, and Compliance with prescribed medications will improve  Medication Management: RN will administer medications as ordered by provider, will assess and evaluate patient's response and provide education to patient for  prescribed medication. RN will report any adverse and/or side effects to prescribing provider.  Therapeutic Interventions: 1 on 1 counseling sessions, Psychoeducation, Medication administration, Evaluate responses to treatment, Monitor vital signs and CBGs as ordered, Perform/monitor CIWA, COWS, AIMS and Fall Risk screenings as ordered, Perform wound care treatments as ordered.  Evaluation of Outcomes: Progressing   LCSW Treatment Plan for Primary Diagnosis: MDD (major depressive disorder), recurrent episode, severe (HCC) Long Term Goal(s): Safe transition to appropriate next level of care at discharge, Engage patient in therapeutic group addressing interpersonal concerns.  Short Term Goals: Engage patient in aftercare planning with referrals and resources, Increase social support, Increase ability to appropriately verbalize feelings, Identify triggers associated with mental health/substance abuse issues, and Increase skills for wellness and recovery  Therapeutic Interventions: Assess for all discharge needs, 1 to 1 time with Social worker, Explore available resources and support systems, Assess for adequacy in community support network, Educate family and significant other(s) on suicide prevention, Complete Psychosocial Assessment, Interpersonal group therapy.  Evaluation of Outcomes: Progressing   Progress in Treatment: Attending groups: Yes. and No. Participating in groups: Yes. and No. Taking medication as prescribed: Yes. Toleration medication: Yes. Family/Significant other contact made: No, will contact:  pt declined consents Patient understands diagnosis: Yes.  Discussing patient identified problems/goals with staff: Yes. Medical problems stabilized or resolved: Yes. Denies suicidal/homicidal ideation: Yes. Issues/concerns per patient self-inventory: No.   New problem(s) identified: No, Describe:  none  New Short Term/Long Term Goal(s): medication stabilization, elimination of  SI thoughts, development of comprehensive mental wellness plan.    Patient Goals:  "A better life"  Discharge Plan or Barriers: Patient recently admitted. CSW will continue to follow and assess for appropriate referrals and possible discharge planning.    Reason for Continuation of Hospitalization: Anxiety Depression Medication stabilization Suicidal ideation  Estimated Length of Stay: 3-5 days  Attendees: Patient: Joshua Thomas 08/15/2020   Physician: Arna Snipe, DO 08/15/2020   Nursing:  08/15/2020  RN Care Manager: 08/15/2020   Social Worker: Ruthann Cancer, LCSW 08/15/2020   Recreational Therapist:  08/15/2020   Other:  08/15/2020   Other:  08/15/2020   Other: 08/15/2020    Scribe for Treatment Team: Felizardo Hoffmann, LCSWA 08/15/2020 12:12 PM

## 2020-08-15 NOTE — ED Triage Notes (Signed)
Pt sent from [facility] for abscess to back of right side of neck. Denies drainage.

## 2020-08-15 NOTE — Progress Notes (Signed)
Report called from [facility] on pt returning to [facility]. Pt had abscess drained and medications given.   2255 Pt returned with prescription for doxycycline. Pt VS assessed and nighttime PRNs given as appropriate. Pt also given food and drink as requested. Eating meal in dayroom.

## 2020-08-15 NOTE — [facility] Counselor (Signed)
CSW received a call from [facility] who states that will not be reviewing any more applications until August 17th due to the doctor being on vacation. They would like for pt to contact them once he finishes the program at [facility].  Fredirick Lathe, LCSWA Clinicial Social Worker [employer]

## 2020-08-15 NOTE — ED Notes (Signed)
Patient has a urine in the main lab

## 2020-08-16 MED ORDER — TRAZODONE HCL 100 MG PO TABS
100.0000 mg | ORAL_TABLET | Freq: Every evening | ORAL | Status: DC | PRN
  Administered 2020-08-16 – 2020-08-17 (×2): 100 mg via ORAL
  Filled 2020-08-16: qty 14
  Filled 2020-08-16 (×2): qty 1

## 2020-08-16 MED ORDER — MELATONIN 3 MG PO TABS
3.0000 mg | ORAL_TABLET | Freq: Every day | ORAL | Status: DC
  Administered 2020-08-16 – 2020-08-17 (×2): 3 mg via ORAL
  Filled 2020-08-16 (×3): qty 1
  Filled 2020-08-16: qty 14

## 2020-08-16 MED ORDER — DOCUSATE SODIUM 100 MG PO CAPS
100.0000 mg | ORAL_CAPSULE | Freq: Every day | ORAL | Status: DC
  Administered 2020-08-16 – 2020-08-17 (×2): 100 mg via ORAL
  Filled 2020-08-16: qty 14
  Filled 2020-08-16 (×4): qty 1

## 2020-08-16 MED ORDER — POLYETHYLENE GLYCOL 3350 17 G PO PACK
17.0000 g | PACK | Freq: Every day | ORAL | Status: DC
  Administered 2020-08-16 – 2020-08-17 (×2): 17 g via ORAL
  Filled 2020-08-16 (×4): qty 1
  Filled 2020-08-16: qty 14

## 2020-08-16 MED ORDER — TRAZODONE HCL 150 MG PO TABS: 150.0000 mg | ORAL_TABLET | Freq: Every evening | ORAL | Status: DC | PRN

## 2020-08-16 MED ORDER — GABAPENTIN 300 MG PO CAPS
300.0000 mg | ORAL_CAPSULE | Freq: Three times a day (TID) | ORAL | Status: DC
  Administered 2020-08-16 – 2020-08-17 (×4): 300 mg via ORAL
  Filled 2020-08-16 (×3): qty 1
  Filled 2020-08-16: qty 42
  Filled 2020-08-16 (×2): qty 1
  Filled 2020-08-16 (×2): qty 42
  Filled 2020-08-16 (×3): qty 1

## 2020-08-16 MED ORDER — DOXYCYCLINE HYCLATE 100 MG PO TABS
100.0000 mg | ORAL_TABLET | Freq: Two times a day (BID) | ORAL | Status: DC
  Administered 2020-08-16 – 2020-08-17 (×4): 100 mg via ORAL
  Filled 2020-08-16 (×5): qty 1
  Filled 2020-08-16: qty 9
  Filled 2020-08-16 (×3): qty 1
  Filled 2020-08-16: qty 9

## 2020-08-16 MED ORDER — TRAZODONE HCL 100 MG PO TABS: 100.0000 mg | ORAL_TABLET | Freq: Every evening | ORAL | Status: DC | PRN

## 2020-08-16 MED ORDER — BISACODYL 5 MG PO TBEC
5.0000 mg | DELAYED_RELEASE_TABLET | Freq: Once | ORAL | Status: AC | PRN
  Administered 2020-08-17: 5 mg via ORAL
  Filled 2020-08-16: qty 1

## 2020-08-16 NOTE — Progress Notes (Signed)
Pt says neck feels a little less tight this morning. No drainage from right posterior neck area. Area is tender and reddened. Pain 3/10. Given PRN per [facility]

## 2020-08-16 NOTE — Progress Notes (Addendum)
   08/16/20 2015  Psych Admission Type (Psych Patients Only)  Admission Status Voluntary  Psychosocial Assessment  Patient Complaints Anxiety  Eye Contact Brief  Facial Expression Animated;Anxious  Affect Appropriate to circumstance  Speech Logical/coherent  Interaction Assertive  Motor Activity Other (Comment) (wnl)  Appearance/Hygiene Unremarkable  Behavior Characteristics Cooperative;Appropriate to situation;Anxious  Mood Anxious;Pleasant  Thought Process  Coherency WDL  Content WDL  Delusions None reported or observed  Perception WDL  Hallucination None reported or observed  Judgment Limited  Confusion None  Danger to Self  Current suicidal ideation? Denies  Danger to Others  Danger to Others None reported or observed   Pt seen in the hallway interacting with his peers. Pt denies SI, HI, AVH. Rates anxiety 3/10. Pt rates pain 6/10 as chronic pain in his lower back. Lanced abscess on right posterior neck is c/d/I. Redness but not soreness. Pt demonstrates he is able to move his neck with full range of motion and limited soreness.   Pt is euphoric. "I've had the best day I've had in 10 years. I feel good. I have no charges against me. And I don't have AIDS. Have you ever seen someone with AIDS. Not HIV but AIDS. They waste away. I don't want my family to see me like that. I've done a lot of dope and been with a lot of women. I just knew the ninja was gonna get me but it hasn't. For the first time I feel like I can be a normal person with a normal life. I don't want to be rich, just have a job and help people. I feel good."  Pt given positive support and encouragement.

## 2020-08-16 NOTE — [facility] Group Notes (Signed)
LCSW Group Therapy Note  Date/Time:  08/16/2020   10:00AM-11:00AM  Type of Therapy and Topic:  Group Therapy:  Fears and Unhealthy/Healthy Coping Skills  Participation Level:  Active   Description of Group:  The focus of this group was to discuss some of the prevalent fears that patients experience, and to identify the commonalities among group members.  A fun exercise was used to initiate the discussion, followed by writing on the white board a group-generated list of unhealthy coping and healthy coping techniques to deal with each fear.    Therapeutic Goals: Patient will be able to distinguish between healthy and unhealthy coping skills Patient will identify and describe 3 fears they experience Patient will identify one positive coping strategy for each fear they experience Patient will respond empathetically to peers' statements regarding fears they experience  Summary of Patient Progress:  The patient expressed himself freely and insightfully while present in group, but he was gone for most of the group due to meeting with the doctor.  Therapeutic Modalities Cognitive Behavioral Therapy Motivational Interviewing  Ambrose Mantle, LCSW

## 2020-08-16 NOTE — Progress Notes (Signed)
Patient said, he wants more ice cream. His ice cream has been laying on the table and now it's melt.Staff advise patient he needs to talk to staff member that's been in dayroom with him. RN notify.

## 2020-08-16 NOTE — Progress Notes (Signed)
The focus of this group is to help patients review their daily goal of treatment and discuss progress on daily workbooks. Pt attended the evening group session and responded to all discussion prompts from the Writer. Pt shared that today was a good day on the unit, the highlight of which was hanging out with his peers in the dayroom.  Pt told that his goal for the coming week was to be discharged to [facility], which he was optimistic about. Zack did have questions about the facility that this Writer could not answer. He was encouraged to address those to either his social worker or to the facility directly.  Yukio rated his day a 7 out of 10 and his affect was appropriate.

## 2020-08-16 NOTE — Progress Notes (Addendum)
   08/16/20 1343  Psych Admission Type (Psych Patients Only)  Admission Status Voluntary  Psychosocial Assessment  Patient Complaints Decreased concentration;Sleep disturbance  Eye Contact Fair  Facial Expression Animated;Anxious  Affect Appropriate to circumstance  Speech Logical/coherent  Interaction Assertive  Motor Activity Other (Comment) (wnl)  Appearance/Hygiene Unremarkable  Behavior Characteristics Cooperative;Fidgety  Mood Anxious;Pleasant  Aggressive Behavior  Targets Property  Type of Behavior Striking out  Effect No apparent injury  Thought Process  Coherency WDL  Content WDL  Delusions None reported or observed  Perception WDL  Hallucination None reported or observed  Judgment Poor  Confusion None  Danger to Self  Current suicidal ideation? Denies  Self-Injurious Behavior No self-injurious ideation or behavior indicators observed or expressed   Agreement Not to Harm Self Yes  Description of Agreement Verbal contract  Danger to Others  Danger to Others None reported or observed   D. Pt presents as anxious, fidgety, but is friendly during interactions. Pt endorses poor sleep and poor concentration, but reports that this is something that he has struggled with for a long time. Pt has been visible in the milieu throughout the shift, observed interacting appropriately with peers.  Pt currently denies SI/HI and AVH  A. Labs and vitals monitored. Pt given and educated on medications. Pt supported emotionally and encouraged to express concerns and ask questions.   R. Pt remains safe with 15 minute checks. Will continue POC.

## 2020-08-16 NOTE — Progress Notes (Addendum)
   08/15/20 2250  Psych Admission Type (Psych Patients Only)  Admission Status Voluntary  Psychosocial Assessment  Patient Complaints Other (Comment) (discomfort from procedure in ED)  Eye Contact Fair  Facial Expression Animated;Anxious  Affect Appropriate to circumstance  Speech Logical/coherent  Interaction Assertive  Motor Activity Other (Comment) (wnl)  Appearance/Hygiene Unremarkable  Behavior Characteristics Cooperative;Appropriate to situation  Mood Anxious  Thought Process  Coherency WDL  Content WDL  Delusions None reported or observed  Perception WDL  Hallucination None reported or observed  Judgment Poor  Confusion None  Danger to Self  Current suicidal ideation? Denies  Danger to Others  Danger to Others None reported or observed   Pt denies SI, HI, AVH. Endorses pain 3/10 from the draining of the abscess on the right posterior neck. "I think it was an infected hair. A day ago, I was scratching this bump on the back of my head. Must have got infected. In about 12 hours, it had grown to golf ball size and I couldn't sleep because it hurt so bad." Pt given Toradol 60 mg IM in the ED for it.

## 2020-08-16 NOTE — Progress Notes (Signed)
Pt did not attend group. 

## 2020-08-16 NOTE — Progress Notes (Addendum)
[facility]  08/16/2020 2:41 PM Joshua Thomas  MRN:  [id number] Subjective:   Joshua Thomas is a 45 yr old male who presented after an attempted overdose. PPH is significant for MDD, Suicide Attempt via Overdose, Polysubstance Abuse (Meth, Cocaine, Opiate, Benzo), [facility].  Pt is seen and examined today. Pt states his mood is "ok" and "decent ". Pt rates his mood at 7/10 (10 is the best mood). Pt did not sleep well last night.  Patient states he feels restless.  Nursing notes indicate that Pt slept for 4.5 hours.  He states he needs more trazodone at night to help with his insomnia.  Pt states his appetite is good. Pt states his anxiety is low today but he wants to increase his Neurontin.  Patient states he cannot take antihistaminics because if he takes he would be kicking at walls at night. Currently, Pt denies any suicidal ideation, homicidal ideation and, visual and auditory hallucination.  Patient states once in a blue moon he hears somebody whispering his name in his ears.  Patient reports anxiety and states he is worried that somebody would want to take his passion but denies feeling that somebody is going to harm him.  Pt denies any headache, nausea, vomiting, dizziness, chest pain, SOB, abdominal pain, and diarrhea.  Patient denies any neck pain or drainage.  Patient declined tetanus shot states he does not want to take if it is not mandatory. Patient reports constipation.  Pt denies any medication side effects and has been tolerating it well.   Patient was told that [facility]  [facility] but he needs long term substance abuse treatment.  He states [facility].  Principal Problem: MDD (major depressive disorder), recurrent episode, severe (HCC) Diagnosis: Principal Problem:   MDD (major depressive  disorder), recurrent episode, severe (HCC) Active Problems:   Polysubstance abuse (HCC)   Substance induced mood disorder (HCC)   Suicidal ideation   Opioid abuse (HCC)   Amphetamine abuse (HCC)  Total Time spent with patient: 30 minutes  Past Psychiatric History: MDD, Suicide Attempt via Overdose, Polysubstance Abuse (Meth, Cocaine, Opiate, Benzo), [facility].  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   COPD (chronic obstructive pulmonary disease) (HCC)    DDD (degenerative disc disease), lumbar    Degenerative disc disease, lumbar    Depression    Discitis    GERD (gastroesophageal reflux disease)    Hepatitis C    Hepatitis C    History of stomach ulcers    Hx of diabetes mellitus    due to infection   Hypertension    Lung nodules    3 on L and 2 on R   PTSD (post-traumatic stress disorder)    Sciatica    Substance abuse (HCC)     Past Surgical History:  Procedure Laterality Date   FRACTURE SURGERY     HARDWARE REMOVAL  01/27/2012   Procedure: HARDWARE REMOVAL;  Surgeon: Kathryne Hitchhristopher Y Blackman, MD;  Location: [facility];  Service: Orthopedics;  Laterality: Left;   IR LUMBAR DISC ASPIRATION W/IMG GUIDE  06/08/2017   RADIOLOGY WITH ANESTHESIA N/A 06/08/2017   Procedure: DISC ASPIRATION;  Surgeon: Julieanne Cotton, MD;  Location: [facility] OR;  Service: Radiology;  Laterality: N/A;   TIBIA IM NAIL INSERTION  01/27/2012   Procedure: INTRAMEDULLARY (IM) NAIL TIBIAL;  Surgeon: Kathryne Hitch, MD;  Location: [facility];  Service: Orthopedics;  Laterality: Left;  Removal of IM Rod and Screws Left Tibia with Exchange Nail, Allograft Bone Graft Left Tibia   Family History:  Family History  Problem Relation Age of Onset   Diabetes Mother    Alcohol  abuse Father    Cirrhosis Father    Aneurysm Father    Family Psychiatric  History: Father- EtOH abuse Social History:  Social History   Substance and Sexual Activity  Alcohol Use Yes   Comment: occasionally     Social History   Substance and Sexual Activity  Drug Use Yes   Types: Cocaine, IV, Marijuana, Methamphetamines   Comment: opiates-  none april 2021    Social History   Socioeconomic History   Marital status: Single    Spouse name: Not on file   Number of children: Not on file   Years of education: Not on file   Highest education level: Not on file  Occupational History   Not on file  Tobacco Use   Smoking status: Every Day    Packs/day: 1.00    Years: 33.00    Pack years: 33.00    Types: Cigarettes   Smokeless tobacco: Former    Quit date: 08/08/2006   Tobacco comments:    1 PPD  Vaping Use   Vaping Use: Some days  Substance and Sexual Activity   Alcohol use: Yes    Comment: occasionally   Drug use: Yes    Types: Cocaine, IV, Marijuana, Methamphetamines    Comment: opiates-  none april 2021   Sexual activity: Not Currently  Other Topics Concern   Not on file  Social History Narrative   Not on file   Social Determinants of Health   Financial Resource Strain: Not on file  Food Insecurity: Not on file  Transportation Needs: Not on file  Physical Activity: Not on file  Stress: Not on file  Social Connections: Not on file   Sleep: Poor  Appetite:  Good  Current Medications: Current Facility-Administered Medications  Medication Dose Route Frequency Provider Last Rate Last Admin   acetaminophen (TYLENOL) tablet 650 mg  650 mg Oral Q6H PRN Ajibola, Ene A, NP       alum & mag hydroxide-simeth (MAALOX/MYLANTA) 200-200-20 MG/5ML suspension 30 mL  30 mL Oral Q4H PRN Ajibola, Ene A, NP       amLODipine (NORVASC) tablet 5 mg  5 mg Oral Daily Antonieta Pert, MD   5 mg at 08/16/20 0947   bisacodyl (DULCOLAX) EC tablet 5 mg  5 mg Oral Once PRN  Comer Locket, MD       dicyclomine (BENTYL) tablet 20 mg  20 mg Oral Q6H PRN Mason Jim, Hakeen Shipes E, MD       docusate sodium (COLACE) capsule 100 mg  100 mg Oral Daily Ociel Retherford E, MD       doxycycline (VIBRA-TABS) tablet 100 mg  100 mg Oral Q12H Bobbitt, Shalon E, NP   100 mg at 08/16/20 0947   gabapentin (NEURONTIN) capsule 300 mg  300 mg Oral TID Comer Locket, MD  hydrOXYzine (ATARAX/VISTARIL) tablet 25 mg  25 mg Oral Q6H PRN Mason Jim, Keilee Denman E, MD       loperamide (IMODIUM) capsule 2-4 mg  2-4 mg Oral PRN Mason Jim, Annaliza Zia E, MD       magnesium hydroxide (MILK OF MAGNESIA) suspension 30 mL  30 mL Oral Daily PRN Ajibola, Ene A, NP       melatonin tablet 3 mg  3 mg Oral QHS Mason Jim, Barri Neidlinger E, MD       methocarbamol (ROBAXIN) tablet 500 mg  500 mg Oral Q8H PRN Mason Jim, Nils Thor E, MD   500 mg at 08/15/20 2257   naproxen (NAPROSYN) tablet 500 mg  500 mg Oral BID PRN Comer Locket, MD   500 mg at 08/16/20 1497   nicotine (NICODERM CQ - dosed in mg/24 hours) patch 21 mg  21 mg Transdermal Daily Ajibola, Ene A, NP   21 mg at 08/16/20 0945   ondansetron (ZOFRAN-ODT) disintegrating tablet 4 mg  4 mg Oral Q6H PRN Mason Jim, Murice Barbar E, MD       polyethylene glycol (MIRALAX / GLYCOLAX) packet 17 g  17 g Oral Daily Eris Hannan E, MD       sertraline (ZOLOFT) tablet 50 mg  50 mg Oral Daily Lauro Franklin, MD   50 mg at 08/16/20 0946   traZODone (DESYREL) tablet 150 mg  150 mg Oral QHS PRN Comer Locket, MD        Lab Results: No results found for this or any previous visit (from the past 48 hour(s)).  Blood Alcohol level:  Lab Results  Component Value Date   ETH <10 05/10/2019   ETH <10 04/26/2019    Metabolic Disorder Labs: Lab Results  Component Value Date   HGBA1C 6.6 (H) 06/13/2017   MPG 142.72 06/13/2017   MPG 120 05/23/2015   Lab Results  Component Value Date   PROLACTIN 31.3 (H) 05/23/2015   Lab Results  Component Value Date   CHOL 152 05/23/2015   TRIG 191 (H)  05/23/2015   HDL 27 (L) 05/23/2015   CHOLHDL 5.6 05/23/2015   VLDL 38 05/23/2015   LDLCALC 87 05/23/2015    Physical Findings:  Musculoskeletal: Strength & Muscle Tone: within normal limits Gait & Station: normal Patient leans: N/A  Psychiatric Specialty Exam:  Presentation  General Appearance: Appropriate for Environment  Eye Contact:Good  Speech:Clear and Coherent; Normal Rate  Speech Volume:Normal  Handedness:Right   Mood and Affect  Mood:Euthymic  Affect:Appropriate   Thought Process  Thought Processes:Coherent; Goal Directed  Descriptions of Associations:Intact  Orientation:Full (Time, Place and Person)  Thought Content:Logical - no evidence of delusions, paranoia, or acute psychosis on exam  History of Schizophrenia/Schizoaffective disorder:No data recorded Duration of Psychotic Symptoms:No data recorded Hallucinations:Hallucinations: None (Does not report auditory hallucinations today but sometimes rarely hears someone whispering his name and his ears.)  Ideas of Reference:None  Suicidal Thoughts:Suicidal Thoughts: No  Homicidal Thoughts:Homicidal Thoughts: No   Sensorium  Memory:Immediate Fair; Recent Fair  Judgment:Fair  Insight:Fair   Executive Functions  Concentration:Fair  Attention Span:Fair  Recall:Fair  Fund of Knowledge:Fair  Language:Good   Psychomotor Activity  Psychomotor Activity:Psychomotor Activity: Normal   Assets  Assets:Desire for Improvement; Resilience; Communication Skills   Sleep  Sleep:Sleep: Poor Number of Hours of Sleep: 4.5    Physical Exam:  Review of Systems  Respiratory:  Negative for cough and shortness of breath.   Cardiovascular:  Negative for chest pain.  Gastrointestinal:  Positive for constipation. Negative  for abdominal pain, diarrhea, nausea and vomiting.  Musculoskeletal:  Negative for neck pain.  Neurological:  Negative for weakness and headaches.  Psychiatric/Behavioral:   Negative for depression, hallucinations and suicidal ideas. The patient is nervous/anxious.   Blood pressure 133/89, pulse 81, temperature 97.9 F (36.6 C), temperature source Oral, resp. rate 20, height 5\' 3"  (1.6 m), weight 67 kg, SpO2 100 %. Body mass index is 26.17 kg/m.   Treatment Plan Summary: Daily contact with patient to assess and evaluate symptoms and progress in treatment   Joshua Thomas is a 45 yr old male who presented initially after an attempted overdose with continued SI. PPH is significant for MDD, Suicide Attempt via Overdose, Polysubstance Abuse (Meth, Cocaine, Opiate, Benzo), hospitalized at [facility] 2017.   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  He states he does not have any packing in the wound. -Tetanus shot declined by patient -Continue doxycycline twice daily.  Constipation -Start MiraLAX daily -Add Colace 100 mg daily for constipation. -Dulcolax EC tablet 5 mg once as needed for severe constipation.  Nicotine Dependence: -Continue Nicotine patch 21 mg daily   HTN: -Continue Amlodipine 5 mg daily and monitor BP   Insomnia Increase trazodone to 100 mg nightly as needed. -Start melatonin 3 mg nightly.  -Continue PRN's: Tylenol, Maalox, Atarax, Milk of Magnesia, Trazodone     2018, MD 08/16/2020, 2:41 PM

## 2020-08-17 MED ORDER — POLYETHYLENE GLYCOL 3350 17 G PO PACK: 17.0000 g | PACK | Freq: Every day | ORAL | 0 refills | Status: DC | PRN

## 2020-08-17 MED ORDER — MELATONIN 3 MG PO TABS: 3.0000 mg | ORAL_TABLET | Freq: Every day | ORAL | 0 refills | Status: DC

## 2020-08-17 MED ORDER — AMLODIPINE BESYLATE 5 MG PO TABS: 5.0000 mg | ORAL_TABLET | Freq: Every day | ORAL | 0 refills | Status: DC

## 2020-08-17 MED ORDER — DOCUSATE SODIUM 100 MG PO CAPS: 100.0000 mg | ORAL_CAPSULE | Freq: Every day | ORAL | 0 refills | Status: AC

## 2020-08-17 MED ORDER — SERTRALINE HCL 50 MG PO TABS: 50.0000 mg | ORAL_TABLET | Freq: Every day | ORAL | 0 refills | Status: DC

## 2020-08-17 MED ORDER — DOCUSATE SODIUM 100 MG PO CAPS: 100.0000 mg | ORAL_CAPSULE | Freq: Every day | ORAL | 0 refills | Status: DC

## 2020-08-17 MED ORDER — NICOTINE 21 MG/24HR TD PT24: 21.0000 mg | MEDICATED_PATCH | Freq: Every day | TRANSDERMAL | 0 refills | Status: DC

## 2020-08-17 MED ORDER — MELATONIN 3 MG PO TABS: 3.0000 mg | ORAL_TABLET | Freq: Every day | ORAL | 0 refills | Status: AC

## 2020-08-17 MED ORDER — TRAZODONE HCL 100 MG PO TABS: 100.0000 mg | ORAL_TABLET | Freq: Every evening | ORAL | 0 refills | Status: DC | PRN

## 2020-08-17 MED ORDER — NICOTINE 21 MG/24HR TD PT24: 21.0000 mg | MEDICATED_PATCH | Freq: Every day | TRANSDERMAL | 0 refills | Status: AC

## 2020-08-17 MED ORDER — DOXYCYCLINE HYCLATE 100 MG PO CAPS: 100.0000 mg | ORAL_CAPSULE | Freq: Every day | ORAL | 0 refills | Status: DC

## 2020-08-17 MED ORDER — FLEET ENEMA 7-19 GM/118ML RE ENEM
1.0000 | ENEMA | Freq: Once | RECTAL | Status: AC | PRN
  Administered 2020-08-17: 1 via RECTAL
  Filled 2020-08-17: qty 1

## 2020-08-17 MED ORDER — DOXYCYCLINE HYCLATE 100 MG PO CAPS: 100.0000 mg | ORAL_CAPSULE | Freq: Every day | ORAL | 0 refills | Status: AC

## 2020-08-17 MED ORDER — GABAPENTIN 300 MG PO CAPS
300.0000 mg | ORAL_CAPSULE | Freq: Three times a day (TID) | ORAL | 0 refills | Status: DC
Start: 2020-08-17 — End: 2020-08-17

## 2020-08-17 MED ORDER — WHITE PETROLATUM EX OINT
TOPICAL_OINTMENT | CUTANEOUS | Status: AC
  Filled 2020-08-17: qty 5

## 2020-08-17 MED ORDER — GABAPENTIN 300 MG PO CAPS: 300.0000 mg | ORAL_CAPSULE | Freq: Three times a day (TID) | ORAL | 0 refills | Status: DC

## 2020-08-17 NOTE — [facility] Group Notes (Signed)
Adult Psychoeducational Group Note  Date:  08/17/2020 Time:  8:38 PM  Group Topic/Focus:  Coping With Mental Health Crisis:   The purpose of this group is to help patients identify strategies for coping with mental health crisis.  Group discusses possible causes of crisis and ways to manage them effectively.  Participation Level:  Active  Participation Quality:  Appropriate and Attentive  Affect:  Appropriate  Cognitive:  Alert  Insight: Good  Engagement in Group:  Engaged  Modes of Intervention:  Discussion  Additional Comments Joshua Thomas 08/17/2020, 8:38 PM

## 2020-08-17 NOTE — [facility] Group Notes (Signed)
[facility] Group Notes:  (Nursing/MHT/Case Management/Adjunct)  Date:  08/17/2020  Time:  10:00 am  Type of Therapy:   The focus of this group is to help patients establish daily goals to achieve during treatment and discuss how the patient can incorporate goal setting into their daily lives to aide in recovery.    Participation Level:  Did Not Attend  Participation Quality:    Affect:    Cognitive:    Insight:    Engagement in Group:    Modes of Intervention:  Discussion, Education, Rapport Building, and Socialization  Summary of Progress/Problems:  Pt did not attend Goals Group. He did fill out his self inventory.  Goal for today: "Learn about [facility] and prepare for discharge."  Joshua Thomas, Gita Kudo 08/17/2020, 10:00am

## 2020-08-17 NOTE — Progress Notes (Addendum)
[facility] MD Progress Note  08/17/2020 11:32 AM Joshua Thomas  MRN:  [id number] Subjective:   Joshua Thomas is a 45 yr old male who presented after an attempted overdose. PPH is significant for MDD, Suicide Attempt via Overdose, Polysubstance Abuse (Meth, Cocaine, Opiate, Benzo), hospitalized at [facility] 2017.  Pt is seen and examined today. Pt states his mood is "aggravated" this morning as he didn't sleep well last night. Pt rates his mood at 5/10 (10 is the best mood). Pt did not sleep well last night states he forgot to remove his nicotine patch. Nursing notes indicate that Pt slept for 6.25 hours.  Pt states his appetite is good. Pt states his anxiety is low. Pt rates his anxiety at 4-5/10 (10 is the worst anxiety). Currently, Pt denies any suicidal ideation, homicidal ideation and, visual and auditory hallucination. Pt reports earache in rt ear. Denies any drainage. States it feels like wax in the ear. Denies any problem with hearing. Discussed that he is already on Doxycyline which should help for possible ear infection. Pt verbalizes understanding. Pt denies any headache, nausea, vomiting, dizziness, chest pain, SOB, abdominal pain, and diarrhea.  Patient denies any neck pain or drainage. Patient reports constipation states he took Miralax yesterday but hasn't have any bowel movement yet.  MAR indicate that he took Colace and Miralax yesterday. Pt denies any medication side effects and has been tolerating it well.   Patient was told that he will be discharged to [facility] tomorrow. Pt excited to go to [facility].  Principal Problem: MDD (major depressive disorder), recurrent episode, severe (HCC) Diagnosis: Principal Problem:   MDD (major depressive disorder), recurrent episode, severe (HCC) Active Problems:   Polysubstance abuse (HCC)   Substance induced mood disorder (HCC)   Suicidal ideation   Opioid abuse (HCC)   Amphetamine abuse (HCC)  Total Time spent with patient: 30 minutes  Past Psychiatric  History: MDD, Suicide Attempt via Overdose, Polysubstance Abuse (Meth, Cocaine, Opiate, Benzo), hospitalized at [facility] 2017.  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   COPD (chronic obstructive pulmonary disease) (HCC)    DDD (degenerative disc disease), lumbar    Degenerative disc disease, lumbar    Depression    Discitis    GERD (gastroesophageal reflux disease)    Hepatitis C    Hepatitis C    History of stomach ulcers    Hx of diabetes mellitus    due to infection   Hypertension    Lung nodules    3 on L and 2 on R   PTSD (post-traumatic stress disorder)    Sciatica    Substance abuse (HCC)     Past Surgical History:  Procedure Laterality Date   FRACTURE SURGERY     HARDWARE REMOVAL  01/27/2012   Procedure: HARDWARE REMOVAL;  Surgeon: Kathryne Hitch, MD;  Location: [facility];  Service: Orthopedics;  Laterality: Left;   IR LUMBAR DISC ASPIRATION W/IMG GUIDE  06/08/2017   RADIOLOGY WITH ANESTHESIA N/A 06/08/2017   Procedure: DISC ASPIRATION;  Surgeon: Julieanne Cotton, MD;  Location: [facility] OR;  Service: Radiology;  Laterality: N/A;   TIBIA IM NAIL INSERTION  01/27/2012   Procedure: INTRAMEDULLARY (IM) NAIL TIBIAL;  Surgeon: Kathryne Hitch,  MD;  Location: [facility];  Service: Orthopedics;  Laterality: Left;  Removal of IM Rod and Screws Left Tibia with Exchange Nail, Allograft Bone Graft Left Tibia   Family History:  Family History  Problem Relation Age of Onset   Diabetes Mother    Alcohol abuse Father    Cirrhosis Father    Aneurysm Father    Family Psychiatric  History: Father- EtOH abuse Social History:  Social History   Substance and Sexual Activity  Alcohol Use Yes   Comment: occasionally     Social History   Substance and Sexual  Activity  Drug Use Yes   Types: Cocaine, IV, Marijuana, Methamphetamines   Comment: opiates-  none april 2021    Social History   Socioeconomic History   Marital status: Single    Spouse name: Not on file   Number of children: Not on file   Years of education: Not on file   Highest education level: Not on file  Occupational History   Not on file  Tobacco Use   Smoking status: Every Day    Packs/day: 1.00    Years: 33.00    Pack years: 33.00    Types: Cigarettes   Smokeless tobacco: Former    Quit date: 08/08/2006   Tobacco comments:    1 PPD  Vaping Use   Vaping Use: Some days  Substance and Sexual Activity   Alcohol use: Yes    Comment: occasionally   Drug use: Yes    Types: Cocaine, IV, Marijuana, Methamphetamines    Comment: opiates-  none april 2021   Sexual activity: Not Currently  Other Topics Concern   Not on file  Social History Narrative   Not on file   Social Determinants of Health   Financial Resource Strain: Not on file  Food Insecurity: Not on file  Transportation Needs: Not on file  Physical Activity: Not on file  Stress: Not on file  Social Connections: Not on file   Sleep: Poor  Appetite:  Good  Current Medications: Current Facility-Administered Medications  Medication Dose Route Frequency Provider Last Rate Last Admin   acetaminophen (TYLENOL) tablet 650 mg  650 mg Oral Q6H PRN Ajibola, Ene A, NP   650 mg at 08/16/20 2119   alum & mag hydroxide-simeth (MAALOX/MYLANTA) 200-200-20 MG/5ML suspension 30 mL  30 mL Oral Q4H PRN Ajibola, Ene A, NP       amLODipine (NORVASC) tablet 5 mg  5 mg Oral Daily Antonieta Pert, MD   5 mg at 08/17/20 3235   bisacodyl (DULCOLAX) EC tablet 5 mg  5 mg Oral Once PRN Comer Locket, MD       dicyclomine (BENTYL) tablet 20 mg  20 mg Oral Q6H PRN Mason Jim, Amy E, MD       docusate sodium (COLACE) capsule 100 mg  100 mg Oral Daily Mason Jim, Amy E, MD   100 mg at 08/17/20 0807   doxycycline (VIBRA-TABS)  tablet 100 mg  100 mg Oral Q12H Bobbitt, Shalon E, NP   100 mg at 08/17/20 5732   gabapentin (NEURONTIN) capsule 300 mg  300 mg Oral TID Comer Locket, MD   300 mg at 08/17/20 2025   hydrOXYzine (ATARAX/VISTARIL) tablet 25 mg  25 mg Oral Q6H PRN Bartholomew Crews E, MD       loperamide (IMODIUM) capsule 2-4 mg  2-4 mg Oral PRN Mason Jim, Amy E, MD       magnesium hydroxide (MILK OF MAGNESIA) suspension 30  mL  30 mL Oral Daily PRN Ajibola, Ene A, NP       melatonin tablet 3 mg  3 mg Oral QHS Mason JimSingleton, Amy E, MD   3 mg at 08/16/20 2119   methocarbamol (ROBAXIN) tablet 500 mg  500 mg Oral Q8H PRN Comer LocketSingleton, Amy E, MD   500 mg at 08/17/20 0811   naproxen (NAPROSYN) tablet 500 mg  500 mg Oral BID PRN Comer LocketSingleton, Amy E, MD   500 mg at 08/17/20 [id number]   nicotine (NICODERM CQ - dosed in mg/24 hours) patch 21 mg  21 mg Transdermal Daily Ajibola, Ene A, NP   21 mg at 08/17/20 0804   ondansetron (ZOFRAN-ODT) disintegrating tablet 4 mg  4 mg Oral Q6H PRN Mason JimSingleton, Amy E, MD       polyethylene glycol (MIRALAX / GLYCOLAX) packet 17 g  17 g Oral Daily Mason JimSingleton, Amy E, MD   17 g at 08/17/20 [id number]   sertraline (ZOLOFT) tablet 50 mg  50 mg Oral Daily Lauro FranklinPashayan, Alexander S, MD   50 mg at 08/17/20 [id number]   traZODone (DESYREL) tablet 100 mg  100 mg Oral QHS PRN Karsten Rooda, Kennedy Brines, MD   100 mg at 08/16/20 2119    Lab Results: No results found for this or any previous visit (from the past 48 hour(s)).  Blood Alcohol level:  Lab Results  Component Value Date   ETH <10 05/10/2019   ETH <10 04/26/2019    Metabolic Disorder Labs: Lab Results  Component Value Date   HGBA1C 6.6 (H) 06/13/2017   MPG 142.72 06/13/2017   MPG 120 05/23/2015   Lab Results  Component Value Date   PROLACTIN 31.3 (H) 05/23/2015   Lab Results  Component Value Date   CHOL 152 05/23/2015   TRIG 191 (H) 05/23/2015   HDL 27 (L) 05/23/2015   CHOLHDL 5.6 05/23/2015   VLDL 38 05/23/2015   LDLCALC 87 05/23/2015    Physical  Findings:  Musculoskeletal: Strength & Muscle Tone: within normal limits Gait & Station: normal Patient leans: N/A  Psychiatric Specialty Exam:  Presentation  General Appearance: Appropriate for Environment  Eye Contact:Good  Speech:Clear and Coherent  Speech Volume:Normal  Handedness:Right   Mood and Affect  Mood:Euthymic  Affect:Appropriate   Thought Process  Thought Processes:Coherent; Goal Directed  Descriptions of Associations:Intact  Orientation:Full (Time, Place and Person)  Thought Content:Logical - no evidence of delusions, paranoia, or acute psychosis on exam  History of Schizophrenia/Schizoaffective disorder:No data recorded Duration of Psychotic Symptoms:No data recorded Hallucinations:Hallucinations: None  Ideas of Reference:None  Suicidal Thoughts:Suicidal Thoughts: No  Homicidal Thoughts:Homicidal Thoughts: No   Sensorium  Memory:Immediate Fair; Recent Fair  Judgment:Fair  Insight:Fair   Executive Functions  Concentration:Good  Attention Span:Good  Recall:Good  Fund of Knowledge:Good  Language:Good   Psychomotor Activity  Psychomotor Activity:Psychomotor Activity: Normal   Assets  Assets:Desire for Improvement; Resilience; Communication Skills   Sleep  Sleep:Sleep: Fair Number of Hours of Sleep: 6.25    Physical Exam:  Review of Systems  HENT:  Positive for ear pain. Negative for hearing loss and tinnitus.        Rt Ear pain, No Drainage  Respiratory:  Negative for cough and shortness of breath.   Cardiovascular:  Negative for chest pain.  Gastrointestinal:  Positive for constipation. Negative for abdominal pain, diarrhea, nausea and vomiting.  Musculoskeletal:  Negative for neck pain.  Neurological:  Negative for weakness and headaches.  Psychiatric/Behavioral:  Negative for depression, hallucinations and suicidal ideas. The patient  is nervous/anxious and has insomnia.   Blood pressure 125/72, pulse 76,  temperature 98.1 F (36.7 C), temperature source Oral, resp. rate 20, height 5\' 3"  (1.6 m), weight 67 kg, SpO2 99 %. Body mass index is 26.17 kg/m.   Treatment Plan Summary: Daily contact with patient to assess and evaluate symptoms and progress in treatment   Joshua Thomas is a 45 yr old male who presented initially after an attempted overdose with continued SI. PPH is significant for MDD, Suicide Attempt via Overdose, Polysubstance Abuse (Meth, Cocaine, Opiate, Benzo), hospitalized at [facility] 2017.   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 -Tetanus shot declined by patient.  -Continue doxycycline twice daily (Can also help for possible ear infection).   Constipation -Continue MiraLAX daily -Continue Colace 100 mg daily for constipation. -Dulcolax one time dose (Pt didn't get Dulcolax yesterday. Can give today if Pt doesn't have bowel movement this morning).   Nicotine Dependence: -Continue Nicotine patch 21 mg daily. Remove patch at night.    HTN: -Continue Amlodipine 5 mg daily and monitor BP   Insomnia Continue trazodone to 100 mg nightly as needed. -Continue melatonin 3 mg nightly.  -Continue PRN's: Tylenol, Maalox, Atarax, Milk of Magnesia, Trazodone     2018, MD PGY2 08/17/2020, 11:32 AM
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
## 2020-08-17 NOTE — Progress Notes (Addendum)
   08/17/20 1400  Psych Admission Type (Psych Patients Only)  Admission Status Voluntary  Psychosocial Assessment  Patient Complaints Anxiety  Eye Contact Brief  Facial Expression Animated;Anxious  Affect Appropriate to circumstance  Speech Logical/coherent  Interaction Assertive  Motor Activity Other (Comment) (wnl)  Appearance/Hygiene Unremarkable  Behavior Characteristics Cooperative;Appropriate to situation  Mood Anxious;Pleasant  Aggressive Behavior  Targets Property  Type of Behavior Striking out  Effect No apparent injury  Thought Process  Coherency WDL  Content WDL  Delusions None reported or observed  Perception WDL  Hallucination None reported or observed  Judgment Limited  Confusion None  Danger to Self  Current suicidal ideation? Denies  Self-Injurious Behavior No self-injurious ideation or behavior indicators observed or expressed   Agreement Not to Harm Self Yes  Description of Agreement Verbal contract  Danger to Others  Danger to Others None reported or observed    Pt has been calm and cooperative on the unit- Pt has been visible in the milieu throughout the shift interacting well with peers and staff. Pt reported that he is looking forward to discharge- rated his depression, hopelessness and anxiety a 5/3/3,respectively. Pt denies SI/HI and A/VH.

## 2020-08-17 NOTE — [facility] Group Notes (Signed)
[facility] LCSW Group Therapy Note  08/17/2020    Type of Therapy and Topic:  Group Therapy:  Songs to [employer] Actions at Discharge  Participation Level:  Active   Description of Group:   In this group, two songs were played to re-emphasize group on fear from yesterday:  Dear Insecurity and [employer].  Patients were given the opportunity to share their feelings and thoughts.  Patients were then introduced to the concept that additional supports including self-support are an essential part of recovery.  A song (My Own Hero) was played and a group discussion ensued in which patients stated the song inspired them to realize they have be willing to help themselves in order to succeed, because other people cannot achieve sobriety or stability for them.  Patients were encouraged toward self-advocacy and self-support as part of their recovery, including the establishment of appropriate boundaries in various relationships.  They also identified at least one support they need to add in order to achieve their goals at discharge.   Therapeutic Goals: 1)  explain the difference between healthy and unhealthy supports  2)  demonstrate the importance of being a key part of one's own support system 3)  discuss the need for appropriate boundaries with supports 4)  elicit ideas from patients about supports that need to be added in order to achieve goals   Summary of Patient Progress:   The patient expressed support(s) to add at discharge include "a girlfriend who knows right from wrong."  When told that focusing on finding a relationship to rely on may be a dangerous step, he said that in the immediate future he is going to [facility] for rehab, then wants to go to [facility] from there for a few months.  He was given positive feedback for this plan.  The patient participated fully and was appreciative.  Therapeutic Modalities:   Motivational Interviewing Activity  Lynnell Chad

## 2020-08-18 LAB — HEMOGLOBIN A1C
Hgb A1c MFr Bld: 6.9 % — ABNORMAL HIGH (ref 4.8–5.6)
Mean Plasma Glucose: 151.33 mg/dL

## 2020-08-18 MED ORDER — METFORMIN HCL ER 500 MG PO TB24: 500.0000 mg | ORAL_TABLET | Freq: Every day | ORAL | 0 refills | Status: DC

## 2020-08-18 NOTE — Progress Notes (Signed)
   08/18/20 3716  AVS(After Visit Summary)/Transition Record with all 11 required elements/ DC SRA(discharge suicide risk assessment) reviewed/given to:  AVS/Transition Record/DC SRA review/given to Patient  (Document Only) If transitioning to an acute care facility  Patient received all 11 transition record data elements. Receiving facility has access to EMR Yes  Patient received all 11 transition record data elements.  Contact for pending studies, follow-up provider, 24 hr/7 day emergency contact, and follow-up plan of care was given to receiving facility Yes    After speaking with the D.O. Pt discharged to lobby to await Safe Transport to go to [facility].  Pt received all documents, samples, and prescriptions.  Pt received all belongings out of search room as well as his assigned room on unit.  He denied suicidal ideation.  Pt stated that he had no questions for staff at this time.  Pt will receive breakfast while he waits for transport.

## 2020-08-18 NOTE — Progress Notes (Signed)
  [facility] Adult Case Management Discharge Plan :  Will you be returning to the same living situation after discharge:  No. Will discharge to residential SA treatment  At discharge, do you have transportation home?: No. Safe Transport to be arranged Do you have the ability to pay for your medications: No. Samples to be provided at discharge.  Release of information consent forms completed and in the chart;  Patient's signature needed at discharge.  Patient to Follow up at:  Follow-up Information     [facility] Follow up.   Why: You have been accepted to this facility for treatment Monday, August 8th at [facility] information: [address] [place] [address] [phone]         [facility]. Go to.   Specialty: Behavioral Health Why: Please go to this provider for therapy and medication management services during walk in hours:  Monday through Friday, from 8:00 am to 11:00 am.  Services are provided on a first come, first served basis. Contact information: [address] [place] [address] [phone]        [facility] Follow up.   Why: Please contact this treatment facility once you have completed your treatment at [facility] as they will begin having bed openings Aug. 17th. Your point of contact is Hope Budds: [phone] ext [phone] (intake coordinator). Contact information: [address] [place], [place] [address]                Next level of care provider has access to [facility] Link:no  Safety Planning and Suicide Prevention discussed: Yes,  with mother  Have you used any form of tobacco in the last 30 days? (Cigarettes, Smokeless Tobacco, Cigars, and/or Pipes): Yes  Has patient been referred to the Quitline?: Patient refused referral  Patient has been referred for addiction treatment: Yes  Otelia Santee, LCSW 08/18/2020, 8:10 AM

## 2020-08-18 NOTE — Progress Notes (Signed)
   08/18/20 0106  Psych Admission Type (Psych Patients Only)  Admission Status Voluntary  Psychosocial Assessment  Patient Complaints Anxiety  Eye Contact Fair  Facial Expression Animated  Affect Appropriate to circumstance  Speech Logical/coherent  Interaction Assertive  Motor Activity Other (Comment) (WDL)  Appearance/Hygiene Unremarkable  Behavior Characteristics Appropriate to situation  Mood Pleasant  Thought Process  Coherency WDL  Content WDL  Delusions None reported or observed  Perception WDL  Hallucination None reported or observed  Judgment Impaired  Confusion None  Danger to Self  Current suicidal ideation? Denies  Self-Injurious Behavior No self-injurious ideation or behavior indicators observed or expressed   Agreement Not to Harm Self Yes  Description of Agreement Verbal contract  Danger to Others  Danger to Others None reported or observed

## 2020-08-18 NOTE — Discharge Summary (Addendum)
Physician Discharge Summary Note  Patient:  Joshua Thomas is an 45 y.o., male MRN:  [id number] DOB:  Feb 25, 1975 Patient phone:  [phone] (home)  Patient address:   [place] [address],  Total Time spent with patient: 30 minutes  Date of Admission:  08/09/2020 Date of Discharge: 08/18/2020  Reason for Admission:  Per H&P- " Patient is seen and examined.  Patient is a 45 year old male with a past psychiatric history significant for opiate dependence, opiate withdrawal, methamphetamine use disorder, substance-induced mood disorder who presented to the [facility] emergency department on 08/07/2020.  He presented there by emergency medical services for an overdose of heroin.  The patient was apparently at someone's house, and lawn for cement was called due to the patient not breathing.  When the patient arrived he was cyanotic, was given Narcan in route, and was given additional Narcan in the emergency department.  He told the emergency room physicians that he knew he did not have much time left on earth.  He stated that he knew he was going to go through withdrawal from heroin, but stated he needed help and rehabilitation.  He stated if he was released he would go somewhere and overdose again.  He was evaluated by the comprehensive clinical assessment team and reported that he had attempted to overdose on fentanyl on the day prior to the ER visit.  He stated that he had been feeling at peace when I was dead, felt good to be at rest.  He admitted that he had been using opiates since age 29.  In the emergency department he reported that the overdose was a suicide attempt, but on examination today waffled on that answer.  He did state that he had been suicidal since he was a teenager.  He stated he had been psychiatrically hospitalized approximately 4 times in the past.  He reported his last admission was at [facility], but that may have been for medical reasons.  He  has probably 30 emergency room visits since his last hospitalization at our facility in 2017.  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  His discharge medications at that time included duloxetine, gabapentin, hydroxyzine, mirtazapine.  He also has a past medical history significant for hepatitis C as well as osteomyelitis.  He stated that he had not been treated for his hepatitis C, most likely secondary to the fact that he is continue to abuse substances.  He stated that his last substance rehabilitation admission was several years ago.  He stated that he remained at [facility] for approximately a month, and then went to [facility] but only stayed there for a few days.  He is not a good historian and when I asked him several questions about his history he referred me to talk to his mother.  He was admitted to the hospital for evaluation and stabilization."  Principal Problem: MDD (major depressive disorder), recurrent episode, severe ([place]) Discharge Diagnoses: Principal Problem:   MDD (major depressive disorder), recurrent episode, severe ([place]) Active Problems:   Polysubstance abuse ([place])   Substance induced mood disorder ([place])   Suicidal ideation   Opioid abuse ([place])   Amphetamine abuse ([place])   Past Psychiatric History: MDD, Suicide Attempt via Overdose, Polysubstance Abuse (Meth, Cocaine, Opiate, Benzo), hospitalized at [facility] 2017.  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   COPD (chronic obstructive pulmonary disease) (HCC)    DDD (degenerative disc disease), lumbar    Degenerative disc disease, lumbar     Depression    Discitis    GERD (gastroesophageal reflux disease)    Hepatitis C    Hepatitis C    History of stomach ulcers    Hx of diabetes mellitus    due to infection   Hypertension    Lung nodules    3 on L and 2 on R   PTSD (post-traumatic stress disorder)    Sciatica    Substance abuse ([place])     Past Surgical History:  Procedure Laterality Date   FRACTURE SURGERY     HARDWARE REMOVAL  01/27/2012   Procedure: HARDWARE REMOVAL;  Surgeon: Mcarthur Rossetti, MD;  Location: [facility];  Service: Orthopedics;  Laterality: Left;   IR LUMBAR [place] W/IMG GUIDE  06/08/2017   RADIOLOGY WITH ANESTHESIA N/A 06/08/2017   Procedure: DISC ASPIRATION;  Surgeon: Luanne Bras, MD;  Location: [facility];  Service: Radiology;  Laterality: N/A;   TIBIA IM NAIL INSERTION  01/27/2012   Procedure: INTRAMEDULLARY (IM) NAIL TIBIAL;  Surgeon: Mcarthur Rossetti, MD;  Location: [facility];  Service: Orthopedics;  Laterality: Left;  Removal of IM Rod and Screws Left Tibia with Exchange Nail, Allograft Bone Graft Left Tibia   Family History:  Family History  Problem Relation Age of Onset   Diabetes Mother    Alcohol abuse Father    Cirrhosis Father    Aneurysm Father    Family Psychiatric  History: Father- EtOH abuse Social History:  Social History   Substance and Sexual Activity  Alcohol Use Yes   Comment: occasionally     Social History   Substance and Sexual Activity  Drug Use Yes   Types: Cocaine, IV, Marijuana, Methamphetamines   Comment: opiates-  none april 2021    Social History   Socioeconomic History   Marital status: Single    Spouse name: Not on file   Number of children: Not on file   Years of education: Not on file   Highest education level: Not on file  Occupational History   Not on file  Tobacco Use   Smoking status: Every Day    Packs/day: 1.00    Years: 33.00    Pack years: 33.00    Types: Cigarettes   Smokeless tobacco: Former    Quit date:  08/08/2006   Tobacco comments:    1 PPD  Vaping Use   Vaping Use: Some days  Substance and Sexual Activity   Alcohol use: Yes    Comment: occasionally   Drug use: Yes    Types: Cocaine, IV, Marijuana, Methamphetamines    Comment: opiates-  none april 2021   Sexual activity: Not Currently  Other Topics Concern   Not on file  Social History Narrative   Not on file   Social Determinants of Health   Financial Resource Strain: Not on file  Food Insecurity: Not on file  Transportation Needs: Not on file  Physical Activity: Not on file  Stress: Not on file  Social Connections: Not on file    Hospital Course: Patient presented to  [facility] ED on 7/28 for overdose on heroin.  He was found down and given Narcan.  He reported that he had SI and that the overdose on fentanyl was partially an attempt and partially not.  He reported been suicidal since his teen years.  Patient was admitted to [facility] on 7/31.  At that time he was asking for help to become sober and for residential placement.  On 8/2 he was started on Zoloft and gabapentin.  He tolerated these medications well and was titrated on them.  During admission patient was also sent to the ED for I/D of neck folliculitis and was started on Doxycycline. He has accepted to [facility] residential program.  And on day of discharge he reported no SI, HI, or AVH.  On day of discharge patient reports that he is doing so-so.  He reports that his sleep was just average.  He reports that his appetite remains good.  He reports no SI, HI, or AVH.  He reports that the swelling in his neck has gone down and he now has full pain-free range of motion.  He reports that he was able to have a bowel movement last night after the enema.  Discussed with him that his A1c was 6.9 which means he is diabetic.  Discussed with him that he can start taking metformin with dinner.  Discussed with him risk and benefits of the medication.  Discussed with him the need to follow-up  with primary care doctor for further glucose monitoring and medication changes as needed.  He was initially a little taken aback by this news however he reported that his mother was diabetic and he thought that he might be at some point to.  He reports that he was excited to discharge to [facility] residential and had no further concerns.  He was discharged.   Physical Findings: AIMS: Facial and Oral Movements Muscles of Facial Expression: None, normal Lips and Perioral Area: None, normal Jaw: None, normal Tongue: None, normal,Extremity Movements Upper (arms, wrists, hands, fingers): None, normal Lower (legs, knees, ankles, toes): None, normal, Trunk Movements Neck, shoulders, hips: None, normal, Overall Severity Severity of abnormal movements (highest score from questions above): None, normal Incapacitation due to abnormal movements: None, normal Patient's awareness of abnormal movements (rate only patient's report): No Awareness, Dental Status Current problems with teeth and/or dentures?: No Does patient usually wear dentures?: No  CIWA:    COWS:  COWS Total Score: 0  Musculoskeletal: Strength & Muscle Tone: within normal limits Gait & Station: normal Patient leans: N/A   Psychiatric Specialty Exam:  Presentation  General Appearance: Appropriate for Environment; Casual; Fairly Groomed  Eye Contact:Good  Speech:Clear and Coherent; Normal Rate  Speech Volume:Normal  Handedness:Right   Mood and Affect  Mood:Euthymic  Affect:Appropriate; Congruent   Thought Process  Thought Processes:Coherent; Goal Directed  Descriptions of Associations:Intact  Orientation:Full (Time, Place and Person)  Thought Content:Logical  History of Schizophrenia/Schizoaffective disorder:No data recorded Duration of Psychotic Symptoms:No data recorded Hallucinations:Hallucinations: None  Ideas of Reference:None  Suicidal Thoughts:Suicidal Thoughts: No  Homicidal Thoughts:Homicidal  Thoughts: No   Sensorium  Memory:Immediate Fair; Recent Fair  Judgment:Fair  Insight:Fair   Executive Functions  Concentration:Good  Attention Span:Good  [place] of Knowledge:Good  Language:Good   Psychomotor Activity  Psychomotor Activity:Psychomotor Activity: Normal   Assets  Assets:Communication Skills; Resilience; Desire for Improvement   Sleep  Sleep:Sleep: Fair Number of Hours of Sleep: 6.25    Physical Exam: Physical Exam Vitals and nursing note  reviewed.  Constitutional:      General: He is not in acute distress.    Appearance: Normal appearance. He is normal weight. He is not ill-appearing or toxic-appearing.  HENT:     Head: Normocephalic and atraumatic.  Pulmonary:     Effort: Pulmonary effort is normal.  Musculoskeletal:        General: Normal range of motion.  Neurological:     General: No focal deficit present.     Mental Status: He is alert.   Review of Systems  Respiratory:  Negative for cough and shortness of breath.   Cardiovascular:  Negative for chest pain.  Gastrointestinal:  Positive for constipation (improved). Negative for abdominal pain, diarrhea, nausea and vomiting.  Neurological:  Negative for weakness and headaches.  Psychiatric/Behavioral:  Negative for depression, hallucinations and suicidal ideas. The patient is not nervous/anxious.   Blood pressure 125/72, pulse 76, temperature 98.1 F (36.7 C), temperature source Oral, resp. rate 20, height 5' 3" (1.6 m), weight 67 kg, SpO2 99 %. Body mass index is 26.17 kg/m.   Social History   Tobacco Use  Smoking Status Every Day   Packs/day: 1.00   Years: 33.00   Pack years: 33.00   Types: Cigarettes  Smokeless Tobacco Former   Quit date: 08/08/2006  Tobacco Comments   1 PPD   Tobacco Cessation:  A prescription for an FDA-approved tobacco cessation medication provided at discharge   Blood Alcohol level:  Lab Results  Component Value Date   ETH <10  05/10/2019   ETH <10 70/26/3785    Metabolic Disorder Labs:  Lab Results  Component Value Date   HGBA1C 6.9 (H) 08/17/2020   MPG 151.33 08/17/2020   MPG 142.72 06/13/2017   Lab Results  Component Value Date   PROLACTIN 31.3 (H) 05/23/2015   Lab Results  Component Value Date   CHOL 152 05/23/2015   TRIG 191 (H) 05/23/2015   HDL 27 (L) 05/23/2015   CHOLHDL 5.6 05/23/2015   VLDL 38 05/23/2015   LDLCALC 87 05/23/2015    See Psychiatric Specialty Exam and Suicide Risk Assessment completed by Attending Physician prior to discharge.  Discharge destination:  [facility]  Is patient on multiple antipsychotic therapies at discharge:  No   Has Patient had three or more failed trials of antipsychotic monotherapy by history:  No  Recommended Plan for Multiple Antipsychotic Therapies: NA   Prescriptions given at discharge. Patient agreeable to plan. Given opportunity to ask questions. Appears to feel comfortable with discharge denies any current suicidal or homicidal thought.  Patient is also instructed prior to discharge to: Take all medications as prescribed by mental healthcare provider. Report any adverse effects and or reactions from the medicines to outpatient provider promptly. Patient has been instructed & cautioned: To not engage in alcohol and or illegal drug use while on prescription medicines. In the event of worsening symptoms,  patient is instructed to call the crisis hotline, 911 and or go to the nearest ED for appropriate evaluation and treatment of symptoms. To follow-up with primary care provider for other medical issues, concerns and or health care needs  The patient was evaluated each day by a clinical provider to ascertain response to treatment. Improvement was noted by the patient's report of decreasing symptoms, improved sleep and appetite, affect, medication tolerance, behavior, and participation in unit programming.  Patient was asked each day to complete a  self inventory noting mood, mental status, pain, new symptoms, anxiety and concerns.  Patient responded  well to medication and being in a therapeutic and supportive environment. Positive and appropriate behavior was noted and the patient was motivated for recovery. The patient worked closely with the treatment team and case manager to develop a discharge plan with appropriate goals. Coping skills, problem solving as well as relaxation therapies were also part of the unit programming.  By the day of discharge patient was in much improved condition than upon admission.  Symptoms were reported as significantly decreased or resolved completely. The patient denied SI/HI and voiced no AVH. The patient was motivated to continue taking medication with a goal of continued improvement in mental health.   Patient was discharged home with a plan to follow up as noted below.    Discharge Instructions     Diet - low sodium heart healthy   Complete by: As directed    Increase activity slowly   Complete by: As directed       Allergies as of 08/18/2020   No Known Allergies      Medication List     STOP taking these medications    GOODY HEADACHE PO   ibuprofen 200 MG tablet Commonly known as: ADVIL       TAKE these medications      Indication  amLODipine 5 MG tablet Commonly known as: NORVASC Take 1 tablet (5 mg total) by mouth daily for 14 days.  Indication: High Blood Pressure Disorder   docusate sodium 100 MG capsule Commonly known as: COLACE Take 1 capsule (100 mg total) by mouth daily for 14 days.  Indication: Constipation   doxycycline 100 MG capsule Commonly known as: VIBRAMYCIN Take 1 capsule (100 mg total) by mouth daily for 5 days.  Indication: abscess   gabapentin 300 MG capsule Commonly known as: NEURONTIN Take 1 capsule (300 mg total) by mouth 3 (three) times daily.  Indication: withdrawal cravings/anxiety   melatonin 3 MG Tabs tablet Take 1 tablet (3 mg total)  by mouth at bedtime for 14 days.  Indication: Trouble Sleeping   metFORMIN 500 MG 24 hr tablet Commonly known as: Glucophage XR Take 1 tablet (500 mg total) by mouth daily with supper.  Indication: Type 2 Diabetes   naloxone 4 MG/0.1ML Liqd nasal spray kit Commonly known as: NARCAN Take in case of overdose  Indication: Opioid Overdose   nicotine 21 mg/24hr patch Commonly known as: NICODERM CQ - dosed in mg/24 hours Place 1 patch (21 mg total) onto the skin daily for 14 days.  Indication: Nicotine Addiction   polyethylene glycol 17 g packet Commonly known as: MIRALAX / GLYCOLAX Take 17 g by mouth daily as needed for moderate constipation.  Indication: Constipation   sertraline 50 MG tablet Commonly known as: ZOLOFT Take 1 tablet (50 mg total) by mouth daily for 14 days.  Indication: Major Depressive Disorder   traZODone 100 MG tablet Commonly known as: DESYREL Take 1 tablet (100 mg total) by mouth at bedtime as needed for up to 14 days for sleep. What changed:  when to take this reasons to take this  Indication: Trouble Sleeping        Follow-up Information     Services, [facility] Follow up.   Why: You have been accepted to this facility for treatment Monday, August 8th at [facility] information: [place] [id number] [phone]         [facility]. Go to.   Specialty: Behavioral Health Why: Please go to this  provider for therapy and medication management services during walk in hours:  Monday through Friday, from 8:00 am to 11:00 am.  Services are provided on a first come, first served basis. Contact information: [place] [phone]        [facility] Follow up.   Why: Please contact this treatment facility once you have completed your treatment at [facility] as they will begin having bed openings Aug. 17th. Your point of contact is Eda Paschal: [phone] ext [phone]  (intake coordinator). Contact information: [address] [place], [place] [address]                Follow-up recommendations:   - Activity as tolerated. - Diet as recommended by PCP. - Keep all scheduled follow-up appointments as recommended.  Comments: Patient is instructed to take all prescribed medications as recommended. Report any side effects or adverse reactions to your outpatient psychiatrist. Patient is instructed to abstain from alcohol and illegal drugs while on prescription medications. In the event of worsening symptoms, patient is instructed to call the crisis hotline, 911, or go to the nearest emergency department for evaluation and treatment.  Signed: Briant Cedar, MD 08/18/2020, 6:44 AM

## 2020-09-23 ENCOUNTER — Other Ambulatory Visit: Payer: Self-pay

## 2020-09-23 ENCOUNTER — Emergency Department (HOSPITAL_COMMUNITY)
Admission: EM | Admit: 2020-09-23 | Discharge: 2020-09-24 | Disposition: A | Discharge status: Home / Self Care | Payer: Self-pay | Attending: Emergency Medicine | Admitting: Emergency Medicine

## 2020-09-23 ENCOUNTER — Ambulatory Visit (HOSPITAL_COMMUNITY)
Admission: RE | Admit: 2020-09-23 | Discharge: 2020-09-23 | Disposition: A | Discharge status: Home / Self Care | Payer: Self-pay | Attending: Psychiatry | Admitting: Psychiatry

## 2020-09-23 ENCOUNTER — Emergency Department (HOSPITAL_COMMUNITY): Payer: Self-pay

## 2020-09-23 ENCOUNTER — Encounter (HOSPITAL_COMMUNITY): Payer: Self-pay

## 2020-09-23 DIAGNOSIS — F17210 Nicotine dependence, cigarettes, uncomplicated: Secondary | ICD-10-CM | POA: Insufficient documentation

## 2020-09-23 DIAGNOSIS — F1190 Opioid use, unspecified, uncomplicated: Secondary | ICD-10-CM | POA: Insufficient documentation

## 2020-09-23 DIAGNOSIS — Z20822 Contact with and (suspected) exposure to covid-19: Secondary | ICD-10-CM | POA: Insufficient documentation

## 2020-09-23 DIAGNOSIS — Z7984 Long term (current) use of oral hypoglycemic drugs: Secondary | ICD-10-CM | POA: Insufficient documentation

## 2020-09-23 DIAGNOSIS — F331 Major depressive disorder, recurrent, moderate: Secondary | ICD-10-CM | POA: Insufficient documentation

## 2020-09-23 DIAGNOSIS — F1910 Other psychoactive substance abuse, uncomplicated: Secondary | ICD-10-CM | POA: Insufficient documentation

## 2020-09-23 DIAGNOSIS — F1390 Sedative, hypnotic, or anxiolytic use, unspecified, uncomplicated: Secondary | ICD-10-CM | POA: Insufficient documentation

## 2020-09-23 DIAGNOSIS — F1994 Other psychoactive substance use, unspecified with psychoactive substance-induced mood disorder: Secondary | ICD-10-CM

## 2020-09-23 DIAGNOSIS — R45851 Suicidal ideations: Secondary | ICD-10-CM | POA: Insufficient documentation

## 2020-09-23 DIAGNOSIS — E119 Type 2 diabetes mellitus without complications: Secondary | ICD-10-CM | POA: Insufficient documentation

## 2020-09-23 DIAGNOSIS — Z79899 Other long term (current) drug therapy: Secondary | ICD-10-CM | POA: Insufficient documentation

## 2020-09-23 DIAGNOSIS — J449 Chronic obstructive pulmonary disease, unspecified: Secondary | ICD-10-CM | POA: Insufficient documentation

## 2020-09-23 DIAGNOSIS — I10 Essential (primary) hypertension: Secondary | ICD-10-CM | POA: Insufficient documentation

## 2020-09-23 LAB — COMPREHENSIVE METABOLIC PANEL
ALT: 73 U/L — ABNORMAL HIGH (ref 0–44)
AST: 23 U/L (ref 15–41)
Albumin: 4 g/dL (ref 3.5–5.0)
Alkaline Phosphatase: 49 U/L (ref 38–126)
Anion gap: 7 (ref 5–15)
BUN: 18 mg/dL (ref 6–20)
CO2: 30 mmol/L (ref 22–32)
Calcium: 9.7 mg/dL (ref 8.9–10.3)
Chloride: 104 mmol/L (ref 98–111)
Creatinine, Ser: 0.93 mg/dL (ref 0.61–1.24)
GFR, Estimated: >60 mL/min (ref >60)
Glucose, Bld: 199 mg/dL — ABNORMAL HIGH (ref 70–99)
Potassium: 4.4 mmol/L (ref 3.5–5.1)
Sodium: 141 mmol/L (ref 135–145)
Total Bilirubin: 0.4 mg/dL (ref 0.3–1.2)
Total Protein: 7.5 g/dL (ref 6.5–8.1)

## 2020-09-23 LAB — RAPID URINE DRUG SCREEN, HOSP PERFORMED
Amphetamines: NOT DETECTED
Barbiturates: NOT DETECTED
Benzodiazepines: NOT DETECTED
Cocaine: NOT DETECTED
Opiates: NOT DETECTED
Tetrahydrocannabinol: NOT DETECTED

## 2020-09-23 LAB — RESP PANEL BY RT-PCR (FLU A&B, COVID) ARPGX2
Influenza A by PCR: NEGATIVE
Influenza B by PCR: NEGATIVE
SARS Coronavirus 2 by RT PCR: NEGATIVE

## 2020-09-23 LAB — ACETAMINOPHEN LEVEL: Acetaminophen (Tylenol), Serum: <10 ug/mL — ABNORMAL LOW (ref 10–30)

## 2020-09-23 LAB — CBC
HCT: 41.8 % (ref 39.0–52.0)
Hemoglobin: 13.3 g/dL (ref 13.0–17.0)
MCH: 30.8 pg (ref 26.0–34.0)
MCHC: 31.8 g/dL (ref 30.0–36.0)
MCV: 96.8 fL (ref 80.0–100.0)
Platelets: 130 10*3/uL — ABNORMAL LOW (ref 150–400)
RBC: 4.32 MIL/uL (ref 4.22–5.81)
RDW: 13.7 % (ref 11.5–15.5)
WBC: 7 10*3/uL (ref 4.0–10.5)
nRBC: 0 % (ref 0.0–0.2)

## 2020-09-23 LAB — ETHANOL: Alcohol, Ethyl (B): <10 mg/dL (ref <10)

## 2020-09-23 LAB — TROPONIN I (HIGH SENSITIVITY): Troponin I (High Sensitivity): 3 ng/L (ref <18)

## 2020-09-23 MED ORDER — LORAZEPAM 1 MG PO TABS
0.0000 mg | ORAL_TABLET | Freq: Two times a day (BID) | ORAL | Status: DC
Start: 2020-09-26 — End: 2020-09-24

## 2020-09-23 MED ORDER — THIAMINE HCL 100 MG/ML IJ SOLN: 100.0000 mg | Freq: Every day | INTRAMUSCULAR | Status: DC

## 2020-09-23 MED ORDER — LOPERAMIDE HCL 2 MG PO CAPS
2.0000 mg | ORAL_CAPSULE | ORAL | Status: DC | PRN
  Filled 2020-09-23: qty 1

## 2020-09-23 MED ORDER — CLONIDINE HCL 0.1 MG PO TABS: 0.1000 mg | ORAL_TABLET | Freq: Every day | ORAL | Status: DC

## 2020-09-23 MED ORDER — NAPROXEN 500 MG PO TABS
500.0000 mg | ORAL_TABLET | Freq: Two times a day (BID) | ORAL | Status: DC | PRN
  Administered 2020-09-24: 500 mg via ORAL
  Filled 2020-09-23: qty 1

## 2020-09-23 MED ORDER — METHOCARBAMOL 500 MG PO TABS
500.0000 mg | ORAL_TABLET | Freq: Three times a day (TID) | ORAL | Status: DC | PRN
  Administered 2020-09-24: 500 mg via ORAL
  Filled 2020-09-23: qty 1

## 2020-09-23 MED ORDER — HYDROXYZINE HCL 25 MG PO TABS
25.0000 mg | ORAL_TABLET | Freq: Four times a day (QID) | ORAL | Status: DC | PRN
  Administered 2020-09-24: 25 mg via ORAL
  Filled 2020-09-23: qty 1

## 2020-09-23 MED ORDER — ONDANSETRON 4 MG PO TBDP: 4.0000 mg | ORAL_TABLET | Freq: Four times a day (QID) | ORAL | Status: DC | PRN

## 2020-09-23 MED ORDER — CLONIDINE HCL 0.1 MG PO TABS
0.1000 mg | ORAL_TABLET | Freq: Four times a day (QID) | ORAL | Status: DC
  Administered 2020-09-24 (×2): 0.1 mg via ORAL
  Filled 2020-09-23 (×2): qty 1

## 2020-09-23 MED ORDER — NICOTINE 21 MG/24HR TD PT24
21.0000 mg | MEDICATED_PATCH | Freq: Every day | TRANSDERMAL | Status: DC
  Administered 2020-09-24: 21 mg via TRANSDERMAL
  Filled 2020-09-23: qty 1

## 2020-09-23 MED ORDER — CLONIDINE HCL 0.1 MG PO TABS: 0.1000 mg | ORAL_TABLET | Freq: Two times a day (BID) | ORAL | Status: DC

## 2020-09-23 MED ORDER — LORAZEPAM 2 MG/ML IJ SOLN: 0.0000 mg | Freq: Two times a day (BID) | INTRAMUSCULAR | Status: DC

## 2020-09-23 MED ORDER — THIAMINE HCL 100 MG PO TABS
100.0000 mg | ORAL_TABLET | Freq: Every day | ORAL | Status: DC
  Administered 2020-09-24: 100 mg via ORAL
  Filled 2020-09-23 (×2): qty 1

## 2020-09-23 MED ORDER — LORAZEPAM 1 MG PO TABS
0.0000 mg | ORAL_TABLET | Freq: Four times a day (QID) | ORAL | Status: DC
  Administered 2020-09-24: 2 mg via ORAL
  Filled 2020-09-23: qty 2

## 2020-09-23 MED ORDER — LORAZEPAM 2 MG/ML IJ SOLN: 0.0000 mg | Freq: Four times a day (QID) | INTRAMUSCULAR | Status: DC

## 2020-09-23 MED ORDER — DICYCLOMINE HCL 20 MG PO TABS
20.0000 mg | ORAL_TABLET | Freq: Four times a day (QID) | ORAL | Status: DC | PRN
  Administered 2020-09-24: 20 mg via ORAL
  Filled 2020-09-23: qty 1

## 2020-09-23 NOTE — H&P (Signed)
MSE performed by resident and it is determined that this patient needs to be medically cleared at the emergency department before psychiatric inpatient treatment. MSE to be entered separately.  Disposition: patient to be transported to [facility] for medical clearance.  Dr. Rubin Payor notified via secure chat. EMTALA form completed.

## 2020-09-23 NOTE — ED Notes (Signed)
Red and Gold top tube sent to lab. 

## 2020-09-23 NOTE — ED Notes (Signed)
Per [facility], resident saw patient and states he needs medical clearance due to having CP-using ETOH-needs TTS-no beds available

## 2020-09-23 NOTE — ED Provider Notes (Signed)
[facility]-EMERGENCY DEPT Provider Note   CSN: [id number] Arrival date & time: 09/23/20  1818     History Chief Complaint  Patient presents with   Medical Clearance    Joshua Thomas is a 45 y.o. male.  HPI Patient presents from behavioral health.  Sent for medical clearance.  Subs abuse.  Uses heroin/fentanyl.  States he injects the heroin.  No fevers or chills.  No rashes.  Last used last night.  Also states used Xanax today.  Also uses meth but that is more rare.  Uses a few times a week.  Does not drink alcohol.  Has depression.  Having suicidal thoughts.  History of depression.  States he just does not want to deal with it anymore. Has some chest pain at times.  Will feel shortness of breath at times also.  No exertional chest pain.  States he has some lung nodules that need to be followed but have not followed up as much as he should.    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   COPD (chronic obstructive pulmonary disease) (HCC)    DDD (degenerative disc disease), lumbar    Degenerative disc disease, lumbar    Depression    Discitis    GERD (gastroesophageal reflux disease)    Hepatitis C    Hepatitis C    History of stomach ulcers    Hx of diabetes mellitus    due to infection   Hypertension    Lung nodules    3 on L and 2 on R   PTSD (post-traumatic stress disorder)    Sciatica    Substance abuse [facility])     Patient Active Problem List   Diagnosis Date Noted   MDD (major depressive disorder), recurrent episode, severe (HCC) 08/12/2020   Suicidal ideation 08/12/2020   Opioid abuse (HCC) 08/12/2020   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   Chronic low back pain 09/23/2017   Essential hypertension 09/23/2017   Hyperglycemia 06/03/2017   GERD  (gastroesophageal reflux disease) 05/17/2017   Bipolar disorder (HCC) 05/17/2017   Opioid use disorder, moderate, dependence (HCC) 05/23/2015   Polysubstance abuse (HCC) 05/21/2015   Substance induced mood disorder (HCC) 05/21/2015   Chronic hepatitis C virus genotype 1a infection (HCC)     Past Surgical History:  Procedure Laterality Date   FRACTURE SURGERY     HARDWARE REMOVAL  01/27/2012   Procedure: HARDWARE REMOVAL;  Surgeon: Kathryne Hitch, MD;  Location: [facility] ORS;  Service: Orthopedics;  Laterality: Left;   IR LUMBAR DISC ASPIRATION W/IMG GUIDE  06/08/2017   RADIOLOGY WITH ANESTHESIA N/A 06/08/2017   Procedure: DISC ASPIRATION;  Surgeon: Julieanne Cotton, MD;  Location: [facility] OR;  Service: Radiology;  Laterality: N/A;   TIBIA IM NAIL INSERTION  01/27/2012   Procedure: INTRAMEDULLARY (IM) NAIL TIBIAL;  Surgeon: Kathryne Hitch, MD;  Location: [facility] ORS;  Service: Orthopedics;  Laterality: Left;  Removal of IM Rod and Screws Left Tibia with Exchange Nail, Allograft Bone Graft Left Tibia       Family History  Problem Relation Age of Onset   Diabetes Mother    Alcohol abuse Father    Cirrhosis Father    Aneurysm Father     Social  History   Tobacco Use   Smoking status: Every Day    Packs/day: 1.00    Years: 33.00    Pack years: 33.00    Types: Cigarettes   Smokeless tobacco: Former    Quit date: 08/08/2006   Tobacco comments:    1 PPD  Vaping Use   Vaping Use: Some days  Substance Use Topics   Alcohol use: Yes    Comment: occasionally   Drug use: Yes    Types: Cocaine, IV, Marijuana, Methamphetamines    Comment: fentanyl and heroin    Home Medications Prior to Admission medications   Medication Sig Start Date End Date Taking? Authorizing Provider  Aspirin-Acetaminophen-Caffeine (GOODY HEADACHE PO) Take 1 packet by mouth daily as needed (for headaches or mild pain).   Yes [provider]  naloxone [facility]) nasal spray 4 mg/0.1 mL Take in case of  overdose 08/07/20  Yes Long, Arlyss Repress, MD  polyethylene glycol (MIRALAX / GLYCOLAX) 17 g packet Take 17 g by mouth daily as needed for moderate constipation. Patient taking differently: Take 17 g by mouth daily as needed for moderate constipation (mix as directed, then drink). 08/17/20  Yes Laveda Abbe, NP  amLODipine (NORVASC) 5 MG tablet Take 1 tablet (5 mg total) by mouth daily for 14 days. 08/18/20 09/23/20  Laveda Abbe, NP  gabapentin (NEURONTIN) 300 MG capsule Take 1 capsule (300 mg total) by mouth 3 (three) times daily. 08/17/20 09/23/20  Laveda Abbe, NP  metFORMIN (GLUCOPHAGE XR) 500 MG 24 hr tablet Take 1 tablet (500 mg total) by mouth daily with supper. 08/18/20 09/23/20  Lauro Franklin, MD  sertraline (ZOLOFT) 50 MG tablet Take 1 tablet (50 mg total) by mouth daily for 14 days. 08/18/20 09/23/20  Laveda Abbe, NP  traZODone (DESYREL) 100 MG tablet Take 1 tablet (100 mg total) by mouth at bedtime as needed for up to 14 days for sleep. 08/17/20 09/23/20  Laveda Abbe, NP    Allergies    Patient has no known allergies.  Review of Systems   Review of Systems  Constitutional:  Negative for appetite change.  HENT:  Negative for congestion.   Respiratory:  Negative for shortness of breath.   Cardiovascular:  Positive for chest pain.  Gastrointestinal:  Negative for abdominal pain.  Genitourinary:  Negative for flank pain.  Musculoskeletal:  Negative for back pain.  Skin:  Negative for rash.  Neurological:  Negative for seizures.  Psychiatric/Behavioral:  Positive for suicidal ideas.    Physical Exam Updated Vital Signs BP (!) 138/103 (BP Location: Left Arm)   Pulse 81   Temp 97.9 F (36.6 C) (Oral)   Resp 16   Ht 5\' 3"  (1.6 m)   Wt 70.3 kg   SpO2 100%   BMI 27.46 kg/m   Physical Exam Vitals reviewed.  HENT:     Head: Normocephalic.     Mouth/Throat:     Mouth: Mucous membranes are moist.  Eyes:     Extraocular Movements:  Extraocular movements intact.  Cardiovascular:     Rate and Rhythm: Normal rate.  Pulmonary:     Comments: Small likely lipoma to left lateral lower chest wall. Abdominal:     Tenderness: There is no abdominal tenderness.  Musculoskeletal:        General: No tenderness.     Cervical back: Neck supple.  Skin:    General: Skin is warm.     Capillary Refill: Capillary refill takes less  than 2 seconds.  Neurological:     Mental Status: He is alert and oriented to person, place, and time.    ED Results / Procedures / Treatments   Labs (all labs ordered are listed, but only abnormal results are displayed) Labs Reviewed  COMPREHENSIVE METABOLIC PANEL - Abnormal; Notable for the following components:      Result Value   Glucose, Bld 199 (*)    ALT 73 (*)    All other components within normal limits  CBC - Abnormal; Notable for the following components:   Platelets 130 (*)    All other components within normal limits  ACETAMINOPHEN LEVEL - Abnormal; Notable for the following components:   Acetaminophen (Tylenol), Serum <10 (*)    All other components within normal limits  RESP PANEL BY RT-PCR (FLU A&B, COVID) ARPGX2  ETHANOL  RAPID URINE DRUG SCREEN, HOSP PERFORMED  TROPONIN I (HIGH SENSITIVITY)    EKG None  Radiology DG Chest Portable 1 View  Result Date: 09/23/2020 CLINICAL DATA:  Chest pain EXAM: PORTABLE CHEST 1 VIEW COMPARISON:  04/01/2018 FINDINGS: Lungs are clear.  No pleural effusion or pneumothorax. The heart is normal in size. IMPRESSION: No evidence of acute cardiopulmonary disease. Electronically Signed   By: Charline Bills M.D.   On: 09/23/2020 19:06    Procedures Procedures   Medications Ordered in ED Medications  LORazepam (ATIVAN) injection 0-4 mg (has no administration in time range)    Or  LORazepam (ATIVAN) tablet 0-4 mg (has no administration in time range)  LORazepam (ATIVAN) injection 0-4 mg (has no administration in time range)    Or  LORazepam  (ATIVAN) tablet 0-4 mg (has no administration in time range)  thiamine tablet 100 mg (has no administration in time range)    Or  thiamine (B-1) injection 100 mg (has no administration in time range)  nicotine (NICODERM CQ - dosed in mg/24 hours) patch 21 mg (has no administration in time range)  cloNIDine (CATAPRES) tablet 0.1 mg (has no administration in time range)    Followed by  cloNIDine (CATAPRES) tablet 0.1 mg (has no administration in time range)    Followed by  cloNIDine (CATAPRES) tablet 0.1 mg (has no administration in time range)  dicyclomine (BENTYL) tablet 20 mg (has no administration in time range)  hydrOXYzine (ATARAX/VISTARIL) tablet 25 mg (has no administration in time range)  loperamide (IMODIUM) capsule 2-4 mg (has no administration in time range)  methocarbamol (ROBAXIN) tablet 500 mg (has no administration in time range)  naproxen (NAPROSYN) tablet 500 mg (has no administration in time range)  ondansetron (ZOFRAN-ODT) disintegrating tablet 4 mg (has no administration in time range)    ED Course  I have reviewed the triage vital signs and the nursing notes.  Pertinent labs & imaging results that were available during my care of the patient were reviewed by me and considered in my medical decision making (see chart for details).    MDM Rules/Calculators/A&P                           Patient with suicidal thoughts and substance abuse.  Uses opiates and benzos.  No alcohol.  Occasional meth.  Patient has been medically cleared.  Urine drug screen however was clean.  States also having suicidal thoughts.  We will have patient seen by TTS.  Patient had had some chest pain.  Cleared from a cardiac standpoint  Final Clinical Impression(s) / ED Diagnoses Final  diagnoses:  Polysubstance abuse (HCC)  Suicidal ideation    Rx / DC Orders ED Discharge Orders     None        Benjiman Core, MD 09/24/20 0001

## 2020-09-23 NOTE — BH Assessment (Addendum)
Behavioral Health Medical Screening Exam NOTE WAS COMPLETED BY BOTH Karsten Ro, MD and Park Pope, MD  Joshua Thomas is a 45 y.o. male with reported history of substance use disorder and bipolar disorder presents to the [facility] as a walk-in seeking inpatient as he had relapsed back to his substance use.  Patient states that he was at [facility] inpatient 6 weeks ago and was discharged to [facility].  Patient states that [facility] had rejected him because his blood sugar was high.  Patient decided to start using fentanyl again due to stress and inability to continue with rehabilitation.  Patient states that he is presently suicidal with a plan to overdose, homeless, and homicidal towards the drug dealer that I gave him "bullshit dope".  Patient states that he has been using benzos and then fentanyl for the past several weeks.  Patient states that he last used fentanyl last night approximately 0.5 g.  Patient then shows this Environmental manager marks all over his body that he states were due to his substance use.  Patient also endorses some dizziness, headache, as well as chest discomfort.  Patient states that he has some lung nodules that require further assessment.  Patient denies alcohol use.  Patient does endorse auditory hallucinations where he is called by his name when no one is around him.  Patient denies visual hallucinations.  Patient normally is homeless but will live at son's her mom's when he is not using substances.  Total Time spent with patient: 20 minutes  Psychiatric Specialty Exam:  Presentation  General Appearance: Appropriate for Environment  Eye Contact:Good  Speech:Clear and Coherent  Speech Volume:Normal  Handedness:Right   Mood and Affect  Mood:Depressed  Affect:Constricted   Thought Process  Thought Processes:Coherent  Descriptions of Associations:Intact  Orientation:Full (Time, Place and Person)  Thought Content:Logical  History of Schizophrenia/Schizoaffective disorder:No  data recorded Duration of Psychotic Symptoms:No data recorded Hallucinations:Hallucinations: None  Ideas of Reference:None  Suicidal Thoughts:Suicidal Thoughts: Yes, Active SI Active Intent and/or Plan: With Intent; With Plan  Homicidal Thoughts:Homicidal Thoughts: Yes, Passive HI Passive Intent and/or Plan: With Intent; Without Plan   Sensorium  Memory:Immediate Fair; Recent Fair  Judgment:Poor  Insight:Fair   Executive Functions  Concentration:Fair  Attention Span:Fair  Recall:Fair  Fund of Knowledge:Good  Language:Good   Psychomotor Activity  Psychomotor Activity:Psychomotor Activity: Normal   Assets  Assets:Communication Skills; Resilience; Desire for Improvement; Social Support   Sleep  Sleep:Sleep: Fair    Physical Exam: Physical Exam Review of Systems  Constitutional:  Negative for chills and fever.  Respiratory:  Negative for cough and shortness of breath.   Cardiovascular:        Chest tightness  Gastrointestinal:  Negative for nausea and vomiting.  Neurological:  Positive for dizziness and headaches.  Psychiatric/Behavioral:  Positive for depression, substance abuse and suicidal ideas. Negative for hallucinations. The patient is nervous/anxious. The patient does not have insomnia.   Blood pressure 119/89, pulse 88, temperature (!) 97.5 F (36.4 C), temperature source Oral, resp. rate 20, SpO2 98 %. There is no height or weight on file to calculate BMI.  Musculoskeletal: Strength & Muscle Tone: within normal limits Gait & Station: normal Patient leans: N/A   Recommendations:  Based on my evaluation the patient does not appear to have an emergency medical condition. -Patient meets criteria for inpatient hospitalization.  Patient is actively suicidal with a plan. -Patient will be transferred to ED for medical clearance because of dizziness, headache, chest tightness.  -TTS consult  in the ED can be placed for evaluation and placement to  [facility].  Park Pope, MD Karsten Ro, MD 09/23/2020, 5:16 PM
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
## 2020-09-24 ENCOUNTER — Encounter (HOSPITAL_COMMUNITY): Payer: Self-pay | Admitting: Family Medicine

## 2020-09-24 ENCOUNTER — Inpatient Hospital Stay (HOSPITAL_COMMUNITY)
Admission: AD | Admit: 2020-09-24 | Discharge: 2020-09-29 | DRG: 885 | Disposition: A | Discharge status: Home / Self Care | Payer: Federal, State, Local not specified - Other | Source: Intra-hospital | Attending: Emergency Medicine | Admitting: Emergency Medicine

## 2020-09-24 ENCOUNTER — Other Ambulatory Visit: Payer: Self-pay

## 2020-09-24 DIAGNOSIS — F17210 Nicotine dependence, cigarettes, uncomplicated: Secondary | ICD-10-CM | POA: Diagnosis present

## 2020-09-24 DIAGNOSIS — Z9151 Personal history of suicidal behavior: Secondary | ICD-10-CM | POA: Diagnosis not present

## 2020-09-24 DIAGNOSIS — F1510 Other stimulant abuse, uncomplicated: Secondary | ICD-10-CM | POA: Diagnosis present

## 2020-09-24 DIAGNOSIS — G4700 Insomnia, unspecified: Secondary | ICD-10-CM | POA: Diagnosis present

## 2020-09-24 DIAGNOSIS — G8929 Other chronic pain: Secondary | ICD-10-CM | POA: Diagnosis present

## 2020-09-24 DIAGNOSIS — F329 Major depressive disorder, single episode, unspecified: Secondary | ICD-10-CM | POA: Diagnosis present

## 2020-09-24 DIAGNOSIS — F1190 Opioid use, unspecified, uncomplicated: Secondary | ICD-10-CM | POA: Diagnosis present

## 2020-09-24 DIAGNOSIS — F1110 Opioid abuse, uncomplicated: Secondary | ICD-10-CM | POA: Diagnosis present

## 2020-09-24 DIAGNOSIS — F332 Major depressive disorder, recurrent severe without psychotic features: Secondary | ICD-10-CM | POA: Diagnosis present

## 2020-09-24 DIAGNOSIS — R45851 Suicidal ideations: Secondary | ICD-10-CM

## 2020-09-24 DIAGNOSIS — R45850 Homicidal ideations: Secondary | ICD-10-CM | POA: Diagnosis not present

## 2020-09-24 DIAGNOSIS — E119 Type 2 diabetes mellitus without complications: Secondary | ICD-10-CM | POA: Diagnosis present

## 2020-09-24 DIAGNOSIS — F1120 Opioid dependence, uncomplicated: Secondary | ICD-10-CM | POA: Diagnosis present

## 2020-09-24 DIAGNOSIS — M545 Low back pain, unspecified: Secondary | ICD-10-CM | POA: Diagnosis present

## 2020-09-24 DIAGNOSIS — Z79899 Other long term (current) drug therapy: Secondary | ICD-10-CM

## 2020-09-24 DIAGNOSIS — F1994 Other psychoactive substance use, unspecified with psychoactive substance-induced mood disorder: Secondary | ICD-10-CM | POA: Diagnosis not present

## 2020-09-24 DIAGNOSIS — F1910 Other psychoactive substance abuse, uncomplicated: Secondary | ICD-10-CM | POA: Diagnosis present

## 2020-09-24 MED ORDER — TRAZODONE HCL 50 MG PO TABS
50.0000 mg | ORAL_TABLET | Freq: Every evening | ORAL | Status: DC | PRN
  Administered 2020-09-26 – 2020-09-28 (×3): 50 mg via ORAL
  Filled 2020-09-24 (×3): qty 1
  Filled 2020-09-24: qty 7

## 2020-09-24 MED ORDER — GABAPENTIN 300 MG PO CAPS
300.0000 mg | ORAL_CAPSULE | Freq: Three times a day (TID) | ORAL | Status: DC
  Administered 2020-09-25 – 2020-09-29 (×14): 300 mg via ORAL
  Filled 2020-09-24 (×8): qty 1
  Filled 2020-09-24: qty 21
  Filled 2020-09-24 (×6): qty 1
  Filled 2020-09-24: qty 21
  Filled 2020-09-24 (×2): qty 1
  Filled 2020-09-24: qty 21
  Filled 2020-09-24 (×3): qty 1

## 2020-09-24 MED ORDER — NAPROXEN 500 MG PO TABS
500.0000 mg | ORAL_TABLET | Freq: Two times a day (BID) | ORAL | Status: DC | PRN
  Administered 2020-09-24 – 2020-09-27 (×3): 500 mg via ORAL
  Filled 2020-09-24 (×3): qty 1

## 2020-09-24 MED ORDER — METHOCARBAMOL 500 MG PO TABS
500.0000 mg | ORAL_TABLET | Freq: Three times a day (TID) | ORAL | Status: DC | PRN
  Administered 2020-09-26 – 2020-09-29 (×6): 500 mg via ORAL
  Filled 2020-09-24 (×6): qty 1

## 2020-09-24 MED ORDER — DICYCLOMINE HCL 20 MG PO TABS
20.0000 mg | ORAL_TABLET | Freq: Four times a day (QID) | ORAL | Status: DC | PRN
Start: 2020-09-24 — End: 2020-09-29

## 2020-09-24 MED ORDER — ONDANSETRON 4 MG PO TBDP: 4.0000 mg | ORAL_TABLET | Freq: Four times a day (QID) | ORAL | Status: DC | PRN

## 2020-09-24 MED ORDER — ACETAMINOPHEN 325 MG PO TABS: 650.0000 mg | ORAL_TABLET | Freq: Four times a day (QID) | ORAL | Status: DC | PRN

## 2020-09-24 MED ORDER — CLONIDINE HCL 0.1 MG PO TABS
0.1000 mg | ORAL_TABLET | ORAL | Status: AC
  Administered 2020-09-26 – 2020-09-28 (×4): 0.1 mg via ORAL
  Filled 2020-09-24 (×5): qty 1

## 2020-09-24 MED ORDER — CLONIDINE HCL 0.1 MG PO TABS
0.1000 mg | ORAL_TABLET | Freq: Four times a day (QID) | ORAL | Status: AC
  Administered 2020-09-24 – 2020-09-26 (×7): 0.1 mg via ORAL
  Filled 2020-09-24 (×10): qty 1

## 2020-09-24 MED ORDER — METFORMIN HCL ER 500 MG PO TB24
500.0000 mg | ORAL_TABLET | Freq: Every day | ORAL | Status: DC
  Administered 2020-09-25 – 2020-09-26 (×2): 500 mg via ORAL
  Filled 2020-09-24 (×3): qty 1

## 2020-09-24 MED ORDER — CLONIDINE HCL 0.1 MG PO TABS
0.1000 mg | ORAL_TABLET | Freq: Every day | ORAL | Status: DC
  Administered 2020-09-29: 0.1 mg via ORAL
  Filled 2020-09-24 (×3): qty 1

## 2020-09-24 MED ORDER — ALUM & MAG HYDROXIDE-SIMETH 200-200-20 MG/5ML PO SUSP
30.0000 mL | ORAL | Status: DC | PRN
  Administered 2020-09-25: 30 mL via ORAL
  Filled 2020-09-24: qty 30

## 2020-09-24 MED ORDER — HYDROXYZINE HCL 25 MG PO TABS
25.0000 mg | ORAL_TABLET | Freq: Four times a day (QID) | ORAL | Status: DC | PRN
Start: 2020-09-24 — End: 2020-09-29
  Administered 2020-09-24 – 2020-09-25 (×2): 25 mg via ORAL
  Filled 2020-09-24 (×4): qty 1

## 2020-09-24 MED ORDER — MAGNESIUM HYDROXIDE 400 MG/5ML PO SUSP
30.0000 mL | Freq: Every day | ORAL | Status: DC | PRN
  Administered 2020-09-26: 30 mL via ORAL
  Filled 2020-09-24 (×2): qty 30

## 2020-09-24 MED ORDER — NICOTINE 21 MG/24HR TD PT24
21.0000 mg | MEDICATED_PATCH | Freq: Every day | TRANSDERMAL | Status: DC
Start: 2020-09-25 — End: 2020-09-29
  Administered 2020-09-25 – 2020-09-29 (×5): 21 mg via TRANSDERMAL
  Filled 2020-09-24 (×7): qty 1

## 2020-09-24 MED ORDER — LOPERAMIDE HCL 2 MG PO CAPS: 2.0000 mg | ORAL_CAPSULE | ORAL | Status: DC | PRN

## 2020-09-24 NOTE — Plan of Care (Signed)
Nurse discussed anxiety, depression and coping skills with patient.  
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
## 2020-09-24 NOTE — [facility] Group Notes (Signed)
[facility] Group Notes:  (Nursing/MHT/Case Management/Adjunct)  Date:  09/24/2020  Time:  8:14 PM  Type of Therapy:  Group Therapy  Participation Level:  Did Not Attend  Participation Quality:   na  Affect:   na  Cognitive:   na  Insight:  None  Engagement in Group:   na  Modes of Intervention:   na  Summary of Progress/Problems:  Lorita Officer 09/24/2020, 8:14 PM

## 2020-09-24 NOTE — BH Assessment (Addendum)
Comprehensive Clinical Assessment (CCA) Note  09/24/2020 SHERIDAN GETTEL [id number] Disposition: Clinician and PA Melbourne Abts discussed patient care.  Selena Batten pointed out that Dr. Hazle Quant had already recommended inpatient care.  Clinician informed RN Huntley Dec of recommendation via secure messaging.  Patient has good eye contact but was not sure of the date.  He is not responding to internal stimuli.  He does not appear to be having any delusional thinking.  Patient says he wants to make a change in his life.  He has some SI and mentioned that he overdosed 1.5 month ago.  Patient reports fair sleep and normal appetite.    Patient was at [facility] July 30-August 8, '22.  Patient has no current outpatient provider.     Chief Complaint:  Chief Complaint  Patient presents with   Medical Clearance   Visit Diagnosis: Opiate use d/o severe ; MDD recurrent moderate   CCA Screening, Triage and Referral (STR)  Patient Reported Information How did you hear about [place]? Family/Friend (Mother brought him to [facility] first.)  What Is the Reason for Your Visit/Call Today? Pt mother brought him to [facility] initially.  He was sent to [facility] for medical clearance.  He had some thoughts of SI.  He said he overdosed on phentanyl about a month and a half ago.  At that time he was in [facility] for a few days then went to [facility] in [place] for inpatient care but was turned away becaue of his sugar being too high.  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  He is using phentanyl daily for the last 1.5 months.  Pt is also using meth daily.  Last used phentanyl on 09/13.  Meth last use was about 3 days ago.  How Long Has This Been Causing You Problems? > than 6 months  What Do You Feel Would Help You the Most Today? Alcohol or Drug Use Treatment; Treatment for Depression or other mood problem   Have You Recently Had Any Thoughts About Hurting Yourself? Yes  Are You Planning to Commit Suicide/Harm Yourself At This time?  No   Have you Recently Had Thoughts About Hurting Someone Karolee Ohs? Yes  Are You Planning to Harm Someone at This Time? No  Explanation: No data recorded  Have You Used Any Alcohol or Drugs in the Past 24 Hours? Yes  How Long Ago Did You Use Drugs or Alcohol? No data recorded What Did You Use and How Much? Phentanyl   Do You Currently Have a Therapist/Psychiatrist? No  Name of Therapist/Psychiatrist: No data recorded  Have You Been Recently Discharged From Any Office Practice or Programs? No  Explanation of Discharge From Practice/Program: No data recorded    CCA Screening Triage Referral Assessment Type of Contact: Tele-Assessment  Telemedicine Service Delivery:   Is this Initial or Reassessment? Initial Assessment  Date Telepsych consult ordered in [facility]:  09/23/20  Time Telepsych consult ordered in [facility]:  2300  Location of Assessment: [facility]  Provider Location: [facility]   Collateral Involvement: No data recorded  Does Patient Have a Court Appointed Legal Guardian? No data recorded Name and Contact of Legal Guardian: No data recorded If Minor and Not Living with Parent(s), Who has Custody? No data recorded Is CPS involved or ever been involved? Never  Is APS involved or ever been involved? Never   Patient Determined To Be At Risk for Harm To Self or Others Based on Review of Patient Reported Information or Presenting Complaint?  Yes, for Self-Harm  Method: No data recorded Availability of Means: No data recorded Intent: No data recorded Notification Required: No data recorded Additional Information for Danger to Others Potential: No data recorded Additional Comments for Danger to Others Potential: No data recorded Are There Guns or Other Weapons in Your Home? No data recorded Types of Guns/Weapons: No data recorded Are These Weapons Safely Secured?                            No data recorded Who Could Verify You Are Able To Have These Secured: No  data recorded Do You Have any Outstanding Charges, Pending Court Dates, Parole/Probation? No data recorded Contacted To Inform of Risk of Harm To Self or Others: No data recorded   Does Patient Present under Involuntary Commitment? No  IVC Papers Initial File Date: No data recorded  [place] of Residence: [place]   Patient Currently Receiving the Following Services: No data recorded  Determination of Need: Urgent (48 hours)   Options For Referral: Inpatient Hospitalization     CCA Biopsychosocial Patient Reported Schizophrenia/Schizoaffective Diagnosis in Past: No   Strengths: Pt has a good work Associate Professor.   Mental Health Symptoms Depression:   Worthlessness; Increase/decrease in appetite; Change in energy/activity; Sleep (too much or little); Fatigue; Hopelessness; Tearfulness   Duration of Depressive symptoms:    Mania:   None   Anxiety:    Worrying; Tension; Sleep; Restlessness; Fatigue   Psychosis:   None   Duration of Psychotic symptoms:    Trauma:   None   Obsessions:   None   Compulsions:   None   Inattention:   None   Hyperactivity/Impulsivity:   None   Oppositional/Defiant Behaviors:   None   Emotional Irregularity:   None   Other Mood/Personality Symptoms:  No data recorded   Mental Status Exam Appearance and self-care  Stature:   Average   Weight:   Average weight   Clothing:   Casual   Grooming:   Normal   Cosmetic use:   None   Posture/gait:   Normal   Motor activity:   Restless   Sensorium  Attention:   Normal   Concentration:   Anxiety interferes   Orientation:   X5   Recall/memory:   Normal   Affect and Mood  Affect:   Anxious; Depressed; Tearful   Mood:   Depressed; Anxious; Worthless; Hopeless   Relating  Eye contact:   Normal   Facial expression:   Anxious   Attitude toward examiner:   Cooperative   Thought and Language  Speech flow:  Clear and Coherent; Normal   Thought  content:   Appropriate to Mood and Circumstances   Preoccupation:   None   Hallucinations:   None   Organization:  No data recorded  [employer] of Knowledge:   Average   Intelligence:   Average   Abstraction:   Normal   Judgement:   Fair   Dance movement psychotherapist:   Adequate   Insight:   Fair   Decision Making:   Impulsive   Social Functioning  Social Maturity:  No data recorded  Social Judgement:   "Street Smart"   Stress  Stressors:   Grief/losses; Work; Engineer, civil (consulting); Surveyor, quantity; Relationship   Coping Ability:   Exhausted; Overwhelmed; Deficient supports   Skill Deficits:   Decision making; Self-control   Supports:   Support needed; Family     Religion:  Leisure/Recreation: Leisure / Recreation Do You Have Hobbies?: Yes Leisure and Hobbies: fishing  Exercise/Diet: Exercise/Diet Do You Exercise?: No Do You Follow a Special Diet?: No Do You Have Any Trouble Sleeping?: Yes Explanation of Sleeping Difficulties: varies   CCA Employment/Education Employment/Work Situation: Employment / Work Situation Employment Situation: Unemployed Has Patient ever Been in Equities trader?: No  Education: Education Is Patient Currently Attending School?: No Last Grade Completed: 8 Did You Product manager?: No   CCA Family/Childhood History Family and Relationship History:    Childhood History:     Child/Adolescent Assessment:     CCA Substance Use Alcohol/Drug Use: Alcohol / Drug Use Pain Medications: See MAR Prescriptions: See MAR Over the Counter: See MAR History of alcohol / drug use?: Yes Longest period of sobriety (when/how long): patient states that he has never been clean more than a couple months at a time Negative Consequences of Use: Financial, Personal relationships, Work / Programmer, multimedia Withdrawal Symptoms: Weakness, Tremors, Sweats, Patient aware of relationship between substance abuse and physical/medical complications, Fever /  Chills, Cramps Substance #1 Name of Substance 1: opiates 1 - Age of First Use: 23 1 - Amount (size/oz): varies 1 - Frequency: "when and whatever I can get"                       ASAM's:  Six Dimensions of Multidimensional Assessment  Dimension 1:  Acute Intoxication and/or Withdrawal Potential:      Dimension 2:  Biomedical Conditions and Complications:      Dimension 3:  Emotional, Behavioral, or Cognitive Conditions and Complications:     Dimension 4:  Readiness to Change:     Dimension 5:  Relapse, Continued use, or Continued Problem Potential:     Dimension 6:  Recovery/Living Environment:     ASAM Severity Score:    ASAM Recommended Level of Treatment:     Substance use Disorder (SUD)    Recommendations for Services/Supports/Treatments: Recommendations for Services/Supports/Treatments Recommendations For Services/Supports/Treatments: Individual Therapy, Medication Management, Inpatient Hospitalization  Discharge Disposition:    DSM5 Diagnoses: Patient Active Problem List   Diagnosis Date Noted   MDD (major depressive disorder), recurrent episode, severe (HCC) 08/12/2020   Suicidal ideation 08/12/2020   Opioid abuse (HCC) 08/12/2020   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   Chronic low back pain 09/23/2017   Essential hypertension 09/23/2017   Hyperglycemia 06/03/2017   GERD (gastroesophageal reflux disease) 05/17/2017   Bipolar disorder (HCC) 05/17/2017   Opioid use disorder, moderate, dependence (HCC) 05/23/2015   Polysubstance abuse (HCC) 05/21/2015   Substance induced mood disorder (HCC) 05/21/2015   Chronic hepatitis C virus genotype 1a infection (HCC)      Referrals to Alternative Service(s): Referred to Alternative Service(s):   Place:   Date:   Time:    Referred to Alternative Service(s):   Place:   Date:   Time:    Referred to Alternative Service(s):   Place:   Date:   Time:    Referred to Alternative Service(s):   Place:   Date:   Time:      Wandra Mannan

## 2020-09-24 NOTE — Group Note (Deleted)
LCSW Group Therapy Note   Group Date: 09/24/2020 Start Time: 1300 End Time: 1330   Type of Therapy and Topic:  Group Therapy:   Participation Level:  {BHH PARTICIPATION LEVEL:22264}  Description of Group:   Therapeutic Goals:  1.     Summary of Patient Progress:    ***  Therapeutic Modalities:   Brenly Trawick M Graiden Henes, LCSWA 09/24/2020  2:01 PM    

## 2020-09-24 NOTE — Discharge Instructions (Addendum)
It was our pleasure to provide your ER care today - we hope that you feel better.  Avoid substance use as it can adversely impact both your physical health and your mental health and well being. See resource guide provided for help access substance abuse treatment programs.   Follow up with primary care doctor in the next couple weeks.   For mental health issues and/or crisis, you may also go directly to the [facility] - it is open 24/7 and walk-ins are welcome.   Return to ER if worse, new symptoms, fevers, trouble breathing,  or other emergency concern.  

## 2020-09-24 NOTE — Group Note (Signed)
LCSW Group Therapy Note   Group Date: 09/24/2020 Start Time: 1300 End Time: 1400   Type of Therapy and Topic: Group Therapy: Effective Communication   Participation Level: Did not attend    Description of Group:  In this group patients will be asked to identify their own styles of communication as well as defining and identifying passive, assertive, and aggressive styles of communication. Participants will identify strategies to communicate in a more assertive manner in an effort to appropriately meet their needs. This group will be process-oriented, with patients participating in exploration of their own experiences as well as giving and receiving support and challenge from other group members.   Therapeutic Goals: 1. Patient will identify their personal communication style. 2. Patient will identify passive, assertive, and aggressive forms of communication. 3. Patient will identify strategies for developing more effective communication to appropriately meet their needs.    Summary of Patient Progress:  Did not attend     Therapeutic Modalities:  Communication Skills Solution Focused Therapy Motivational Interviewing  Aram Beecham, [place] 09/24/2020  2:25 PM

## 2020-09-24 NOTE — ED Provider Notes (Signed)
Emergency Medicine Observation Re-evaluation Note  Joshua Thomas is a 45 y.o. male, seen on rounds today.  Pt initially presented to the ED for complaints of substance abuse issues. Pt is resting comfortably, easily aroused. No new c/o this AM. No thoughts of harm to self or others. No physical c/o.   Physical Exam  BP 105/63   Pulse 60   Temp 97.9 F (36.6 C) (Oral)   Resp 16   Ht 1.6 m (5\' 3" )   Wt 70.3 kg   SpO2 97%   BMI 27.46 kg/m  Physical Exam General: calm, alert, nad.  Cardiac: regular rate Lungs: breathing comfortably Psych: calm, alert. Pt w normal mood and affect. No thoughts or plan of self harm, no SI. No thoughts of harm to others. Pt does not appear acutely depressed. Pt is not responding to internal stimuli - no delusions or hallucinations. No acute psychosis.   ED Course / MDM    I have reviewed the labs performed to date as well as medications administered while in observation.  Recent changes in the last 24 hours include ED observation and reassessment.   Plan    Joshua Thomas is not under involuntary commitment.  Pt currently without acute psychosis or SI/HI. No physical c/o. Pt currently appears stable for d/c.    Will give resource guide for social services, and substance abuse outpatient tx options.   Return precautions provided.      Naida Sleight, MD 09/24/20 [phone]

## 2020-09-24 NOTE — ED Notes (Signed)
Per MD steinl, pt is appropriate for d/c from the ER. MD aware that psychiatric team recommends inpatient treatment for patient. Per MD, pt is appropriate for D/c.

## 2020-09-25 DIAGNOSIS — F332 Major depressive disorder, recurrent severe without psychotic features: Principal | ICD-10-CM

## 2020-09-25 LAB — LIPID PANEL
Cholesterol: 137 mg/dL (ref 0–200)
HDL: 32 mg/dL — ABNORMAL LOW (ref >40)
LDL Cholesterol: 93 mg/dL (ref 0–99)
Total CHOL/HDL Ratio: 4.3 RATIO
Triglycerides: 61 mg/dL (ref <150)
VLDL: 12 mg/dL (ref 0–40)

## 2020-09-25 MED ORDER — SERTRALINE HCL 50 MG PO TABS
50.0000 mg | ORAL_TABLET | Freq: Every day | ORAL | Status: DC
  Administered 2020-09-26 – 2020-09-28 (×3): 50 mg via ORAL
  Filled 2020-09-25: qty 7
  Filled 2020-09-25 (×4): qty 1

## 2020-09-25 NOTE — BHH Counselor (Signed)
CSW attempted to complete PSA with this pt, however pt requested CSW come at a later time.      Ruthann Cancer MSW, LCSW Clincal Social Worker  [facility]
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
## 2020-09-25 NOTE — H&P (Signed)
Psychiatric Admission Assessment Adult  Patient Identification: Joshua Thomas MRN:  [id number] Date of Evaluation:  09/25/2020 Chief Complaint:  MDD (major depressive disorder) [F32.9] Principal Diagnosis: <principal problem not specified> Diagnosis:  Active Problems:   Polysubstance abuse (HCC)   Opioid use disorder, moderate, dependence (HCC)   Chronic low back pain   MDD (major depressive disorder), recurrent severe, without psychosis (HCC)   Suicidal ideation   Opioid abuse (HCC)   Amphetamine abuse (HCC)   MDD (major depressive disorder)  History of Present Illness:  Patient is a 45 yo male w/ extensive psychiatric hx of polysubstance abuse, substance induced mood disorder, attempted suicide with heroin, multiple ED visits for substance abuse, and multiple psychiatric hospitalization presents unaccompanied to [facility] as a walk-in due to SI, HI, and substance rehabilitation.  CHART REVIEW Patient has a medical history significant for hepatitis C and osteomyelitis. Patient has not had substance rehabilitation for several years.   In the ED, patient lab was significant for normal COVID PCR, negative UDS, negative acetominophen, negative ethanol, normal CBC other than a low platelet of 130, and normal CMP except for a glucose of 199.   INTERVIEW (09/25/2020) Patient was seen and assessed with Dr. Gasper Sells attending.  Patient states that he is at [facility] to get help with finding a substance use rehabilitation service.  Patient states that he "I am tired of using dope and getting close to ending my life multiple times".  Patient states that since he was discharged from [facility] in August he had gone to [facility] and was turned away because his blood glucose was too high.  Patient then states he walked back to [place].  Patient states that he had been clean from substances for a few days even after going to jail but then ended up using after an unspecified incident.  Patient was not willing to go into  what the incident was but states that this triggered him to use fentanyl.  Patient states that since relapsing on substances he has used methamphetamine and fentanyl regularly.  Patient states that he is presently still withdrawing from fentanyl but states he will be more active in his treatment tomorrow after he is less tired and further from his last use which he states was 2 days ago with fentanyl and 3 days ago with methamphetamine.  Patient was living with mom prior to returning to [facility].  Patient states that he uses his mom as a "crutch" in order to manage his substance use but he no longer wants to be so dependent on others while still using.  Patient states that he has not had a real job for several years.  Patient states he earns money via "other ways to get high".  Patient states that he is currently not suicidal, homicidal, or endorsing AVH.  Patient did state prior to admission that he had felt suicidal wanting to overdose and homicidal towards the dealer that gave him "bullshit dope".  Patient states that last hospitalization he was put on Zoloft and that worked really well for his depression as well as anxiety.  Patient is open to any form of treatment available so long as he is able to go to reach substance rehab from here.  Patient states that he was feeling tired and "that is about all I had to say.  Can I go back to my man cave?".  Patient presently still undergoing some mild withdrawal symptoms including chills, fatigue, mild tremor, and anxiety.  Associated Signs/Symptoms: Depression Symptoms:  depressed mood, fatigue, feelings of worthlessness/guilt, difficulty concentrating, hopelessness, Duration of Depression Symptoms: Greater than two weeks  (Hypo) Manic Symptoms:   N/A Anxiety Symptoms:   N/A Psychotic Symptoms:   N/A PTSD Symptoms: Negative Total Time spent with patient:  I personally spent 60 minutes on the unit in direct patient care. The direct patient care time  included face-to-face time with the patient, reviewing the patient's chart, communicating with other professionals, and coordinating care. Greater than 50% of this time was spent in counseling or coordinating care with the patient regarding goals of hospitalization, psycho-education, and discharge planning needs.   Past Psychiatric History: Polysubstance Abuse, Opioid withdrawal, opioid abuse,   Is the patient at risk to self? Yes.    Has the patient been a risk to self in the past 6 months? Yes.    Has the patient been a risk to self within the distant past? Yes.    Is the patient a risk to others? Yes.    Has the patient been a risk to others in the past 6 months? Yes.    Has the patient been a risk to others within the distant past? Yes.     Prior Inpatient Therapy:   Prior Outpatient Therapy:    Alcohol Screening: 1. How often do you have a drink containing alcohol?: Monthly or less 2. How many drinks containing alcohol do you have on a typical day when you are drinking?: 1 or 2 3. How often do you have six or more drinks on one occasion?: Never AUDIT-C Score: 1 4. How often during the last year have you found that you were not able to stop drinking once you had started?: Never 5. How often during the last year have you failed to do what was normally expected from you because of drinking?: Never 6. How often during the last year have you needed a first drink in the morning to get yourself going after a heavy drinking session?: Never 7. How often during the last year have you had a feeling of guilt of remorse after drinking?: Never 8. How often during the last year have you been unable to remember what happened the night before because you had been drinking?: Never 9. Have you or someone else been injured as a result of your drinking?: No 10. Has a relative or friend or a doctor or another health worker been concerned about your drinking or suggested you cut down?: No Alcohol Use Disorder  Identification Test Final Score (AUDIT): 1 Substance Abuse History in the last 12 months:  Yes.   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   COPD (chronic obstructive pulmonary disease) (HCC)    DDD (degenerative disc disease), lumbar    Degenerative disc disease, lumbar    Depression    Discitis    GERD (gastroesophageal reflux disease)    Hepatitis C    Hepatitis C    History of stomach ulcers    Hx of diabetes mellitus    due to infection   Hypertension    Lung nodules  3 on L and 2 on R   PTSD (post-traumatic stress disorder)    Sciatica    Substance abuse [facility])     Past Surgical History:  Procedure Laterality Date   FRACTURE SURGERY     HARDWARE REMOVAL  01/27/2012   Procedure: HARDWARE REMOVAL;  Surgeon: Kathryne Hitch, MD;  Location: [facility]S;  Service: Orthopedics;  Laterality: Left;   IR LUMBAR DISC ASPIRATION W/IMG GUIDE  06/08/2017   RADIOLOGY WITH ANESTHESIA N/A 06/08/2017   Procedure: DISC ASPIRATION;  Surgeon: Julieanne Cotton, MD;  Location: [facility] OR;  Service: Radiology;  Laterality: N/A;   TIBIA IM NAIL INSERTION  01/27/2012   Procedure: INTRAMEDULLARY (IM) NAIL TIBIAL;  Surgeon: Kathryne Hitch, MD;  Location: [facility];  Service: Orthopedics;  Laterality: Left;  Removal of IM Rod and Screws Left Tibia with Exchange Nail, Allograft Bone Graft Left Tibia   Family History:  Family History  Problem Relation Age of Onset   Diabetes Mother    Alcohol abuse Father    Cirrhosis Father    Aneurysm Father    Family Psychiatric  History: N/A Tobacco Screening:   Social  History:  Social History   Substance and Sexual Activity  Alcohol Use Yes   Alcohol/week: 1.0 standard drink   Types: 1 Standard drinks or equivalent per week   Comment: occasionally     Social History   Substance and Sexual Activity  Drug Use Yes   Frequency: 7.0 times per week   Types: IV, Methamphetamines, Heroin   Comment: heroin daily, meth daily    Additional Social History:                           Allergies:  No Known Allergies Lab Results:  Results for orders placed or performed during the hospital encounter of 09/24/20 (from the past 48 hour(s))  Lipid panel     Status: Abnormal   Collection Time: 09/25/20  6:31 AM  Result Value Ref Range   Cholesterol 137 0 - 200 mg/dL   Triglycerides 61 <161 mg/dL   HDL 32 (L) >09 mg/dL   Total CHOL/HDL Ratio 4.3 RATIO   VLDL 12 0 - 40 mg/dL   LDL Cholesterol 93 0 - 99 mg/dL    Comment:        Total Cholesterol/HDL:CHD Risk Coronary Heart Disease Risk Table                     Men   Women  1/2 Average Risk   3.4   3.3  Average Risk       5.0   4.4  2 X Average Risk   9.6   7.1  3 X Average Risk  23.4   11.0        Use the calculated Patient Ratio above and the CHD Risk Table to determine the patient's CHD Risk.        ATP III CLASSIFICATION (LDL):  <100     mg/dL   Optimal  604-540  mg/dL   Near or Above                    Optimal  130-159  mg/dL   Borderline  981-191  mg/dL   High  >478     mg/dL   Very High Performed at [facility], [address] [place], [place] [address]     Blood Alcohol level:  Lab Results  Component Value Date   ETH <10 09/23/2020   ETH <10 05/10/2019    Metabolic Disorder Labs:  Lab Results  Component Value Date   HGBA1C 6.9 (H) 08/17/2020   MPG 151.33 08/17/2020   MPG 142.72 06/13/2017   Lab Results  Component Value Date   PROLACTIN 31.3 (H) 05/23/2015   Lab Results  Component Value Date   CHOL 137 09/25/2020   TRIG 61 09/25/2020   HDL  32 (L) 09/25/2020   CHOLHDL 4.3 09/25/2020   VLDL 12 09/25/2020   LDLCALC 93 09/25/2020   LDLCALC 87 05/23/2015    Current Medications: Current Facility-Administered Medications  Medication Dose Route Frequency Provider Last Rate Last Admin   acetaminophen (TYLENOL) tablet 650 mg  650 mg Oral Q6H PRN Novella Olive, NP       alum & mag hydroxide-simeth (MAALOX/MYLANTA) 200-200-20 MG/5ML suspension 30 mL  30 mL Oral Q4H PRN Novella Olive, NP       cloNIDine (CATAPRES) tablet 0.1 mg  0.1 mg Oral QID Novella Olive, NP   0.1 mg at 09/25/20 1717   Followed by   Melene Muller ON 09/26/2020] cloNIDine (CATAPRES) tablet 0.1 mg  0.1 mg Oral BH-qamhs Novella Olive, NP       Followed by   Melene Muller ON 09/29/2020] cloNIDine (CATAPRES) tablet 0.1 mg  0.1 mg Oral QAC breakfast Novella Olive, NP       dicyclomine (BENTYL) tablet 20 mg  20 mg Oral Q6H PRN Novella Olive, NP       gabapentin (NEURONTIN) capsule 300 mg  300 mg Oral TID Novella Olive, NP   300 mg at 09/25/20 1717   hydrOXYzine (ATARAX/VISTARIL) tablet 25 mg  25 mg Oral Q6H PRN Novella Olive, NP   25 mg at 09/25/20 0940   loperamide (IMODIUM) capsule 2-4 mg  2-4 mg Oral PRN Novella Olive, NP       magnesium hydroxide (MILK OF MAGNESIA) suspension 30 mL  30 mL Oral Daily PRN Novella Olive, NP       metFORMIN (GLUCOPHAGE-XR) 24 hr tablet 500 mg  500 mg Oral Q supper Novella Olive, NP   500 mg at 09/25/20 1716   methocarbamol (ROBAXIN) tablet 500 mg  500 mg Oral Q8H PRN Novella Olive, NP       naproxen (NAPROSYN) tablet 500 mg  500 mg Oral BID PRN Novella Olive, NP   500 mg at 09/24/20 2146   nicotine (NICODERM CQ - dosed in mg/24 hours) patch 21 mg  21 mg Transdermal Q0600 Novella Olive, NP   21 mg at 09/25/20 0940   ondansetron (ZOFRAN-ODT) disintegrating tablet 4 mg  4 mg Oral Q6H PRN Novella Olive, NP       [START ON 09/26/2020] sertraline (ZOLOFT) tablet 50 mg  50 mg Oral QHS Park Pope, MD       traZODone (DESYREL) tablet 50 mg  50 mg  Oral QHS PRN Novella Olive, NP       PTA Medications: Medications Prior to Admission  Medication Sig Dispense Refill Last Dose   amLODipine (NORVASC) 5 MG tablet Take 1 tablet (5 mg total) by mouth daily for 14 days. 30 tablet 0    Aspirin-Acetaminophen-Caffeine (GOODY HEADACHE PO) Take 1 packet by mouth daily as needed (for headaches or mild pain).      gabapentin (NEURONTIN) 300 MG capsule Take 1 capsule (300 mg total) by mouth  3 (three) times daily. 90 capsule 0    metFORMIN (GLUCOPHAGE XR) 500 MG 24 hr tablet Take 1 tablet (500 mg total) by mouth daily with supper. 30 tablet 0    naloxone (NARCAN) nasal spray 4 mg/0.1 mL Take in case of overdose 1 each 0    polyethylene glycol (MIRALAX / GLYCOLAX) 17 g packet Take 17 g by mouth daily as needed for moderate constipation. (Patient taking differently: Take 17 g by mouth daily as needed for moderate constipation (mix as directed, then drink).) 30 each 0    sertraline (ZOLOFT) 50 MG tablet Take 1 tablet (50 mg total) by mouth daily for 14 days. 30 tablet 0    traZODone (DESYREL) 100 MG tablet Take 1 tablet (100 mg total) by mouth at bedtime as needed for up to 14 days for sleep. 30 tablet 0     Musculoskeletal: Strength & Muscle Tone: within normal limits Gait & Station: normal Patient leans: N/A            Psychiatric Specialty Exam:  Presentation  General Appearance: Appropriate for Environment; Disheveled (Appeared tired)  Eye Contact:Good  Speech:Clear and Coherent  Speech Volume:Normal  Handedness:Right   Mood and Affect  Mood:Depressed; Anxious  Affect:Constricted   Thought Process  Thought Processes:Coherent  Duration of Psychotic Symptoms: No data recorded Past Diagnosis of Schizophrenia or Psychoactive disorder: No  Descriptions of Associations:Circumstantial  Orientation:Full (Time, Place and Person)  Thought Content:Logical; Tangential  Hallucinations:Hallucinations: None Ideas of  Reference:None  Suicidal Thoughts:Suicidal Thoughts: No Homicidal Thoughts:Homicidal Thoughts: No  Sensorium  Memory:Immediate Fair; Recent Fair; Remote Fair  Judgment:Poor  Insight:Fair   Executive Functions  Concentration:Fair  Attention Span:Fair  Recall:Fair  Fund of Knowledge:Fair  Language:Fair   Psychomotor Activity  Psychomotor Activity: Psychomotor Activity: Normal  Assets  Assets:Communication Skills; Desire for Improvement; Social Support   Sleep  Sleep: Sleep: Fair   Physical Exam: Physical Exam Vitals and nursing note reviewed.  Constitutional:      Appearance: Normal appearance. He is normal weight.  HENT:     Head: Normocephalic and atraumatic.  Pulmonary:     Effort: Pulmonary effort is normal.  Skin:    Findings: Bruising present.     Comments: Multiple bruises including on hands. Abrasions on abdominal area and left shoulder.   Neurological:     General: No focal deficit present.     Mental Status: He is oriented to person, place, and time.   Review of Systems  Respiratory:  Negative for shortness of breath.   Cardiovascular:  Negative for chest pain.  Gastrointestinal:  Negative for abdominal pain, constipation, diarrhea, heartburn, nausea and vomiting.  Neurological:  Negative for headaches.  Blood pressure 106/70, pulse 62, temperature 97.6 F (36.4 C), temperature source Oral, resp. rate 18, SpO2 99 %. There is no height or weight on file to calculate BMI.  Treatment Plan Summary: Daily contact with patient to assess and evaluate symptoms and progress in treatment and Medication management  ASSESSMENT Airik Goodlin is a 45 year old male with history of methamphetamine use, opioid use, polysubstance abuse, MDD presents as a walk-in to [facility] for SI/HI and substance use rehabilitation.  Patient appears to present similarly to as he did a month ago.  Patient will require a rehabilitation service that he will qualify for as well as  consider Suboxone in the future to prevent relapse on fentanyl.  MDD, Recurrent, Severe w/o psychosis -Restarting Zoloft 50 mg daily for depression  Opiate and Stimulant use d/o -  amphetamine type: -Bentyl 20 mg q6 PRN -Robaxin 500 mg q8 PRN -Naproxen 500 mg BID PRN -Zofran-ODT 4 mg q6 PRN -Imodium 2mg  PRN -Vistaril 25mg  q6 hours PRN anxiety -Gabapentin to 300 mg TID -COWS: 12, mild withdrawal  Nicotine Dependence: -Continue Nicotine patch 21 mg daily. Remove patch at night.   Insomnia Continue trazodone 100 mg nightly as needed.  PRNs: Tylenol, Maalox, Atarax, Milk of Magnesia  Observation Level/Precautions:  15 minute checks  Laboratory:  CBC Chemistry Profile HbAIC  Psychotherapy:    Medications:    Consultations:    Discharge Concerns:    Estimated LOS:  Other:       Physician Treatment Plan for Primary Diagnosis: <principal problem not specified> Long Term Goal(s): Improvement in symptoms so as ready for discharge  Short Term Goals: Ability to identify changes in lifestyle to reduce recurrence of condition will improve, Ability to verbalize feelings will improve, Ability to disclose and discuss suicidal ideas, Ability to demonstrate self-control will improve, Ability to identify and develop effective coping behaviors will improve, Ability to maintain clinical measurements within normal limits will improve, Compliance with prescribed medications will improve, and Ability to identify triggers associated with substance abuse/mental health issues will improve  Physician Treatment Plan for Secondary Diagnosis: Active Problems:   Polysubstance abuse (HCC)   Opioid use disorder, moderate, dependence (HCC)   Chronic low back pain   MDD (major depressive disorder), recurrent severe, without psychosis (HCC)   Suicidal ideation   Opioid abuse (HCC)   Amphetamine abuse (HCC)   MDD (major depressive disorder)  Long Term Goal(s): Improvement in symptoms so as ready for  discharge  Short Term Goals: Ability to identify changes in lifestyle to reduce recurrence of condition will improve, Ability to verbalize feelings will improve, Ability to disclose and discuss suicidal ideas, Ability to demonstrate self-control will improve, Ability to identify and develop effective coping behaviors will improve, Ability to maintain clinical measurements within normal limits will improve, Compliance with prescribed medications will improve, and Ability to identify triggers associated with substance abuse/mental health issues will improve  I certify that inpatient services furnished can reasonably be expected to improve the patient's condition.    , MD 9/15/20225:20 PM

## 2020-09-25 NOTE — [facility] Group Notes (Signed)
Patient did not attend the Psycho-Ed group. 

## 2020-09-25 NOTE — Progress Notes (Signed)
Pt has been isolative in his room, resting in bed. He didn't attend wrap-up group last night. Pt was complaining of withdrawal symptoms of sweating, feeling hot & cold, body aches, and nausea. Pt was medicated according to his PRN medications ordered, please see MAR. Also encouraging fluids, pt was provided 240 mLs of Gatorade twice. Reports last use of heroin was on 09/23/2020 at 12 pm. Pt denies SI/HI and AVH. Active listening, reassurance, and support provided. Medications administered as ordered by provider. Q 15 min safety checks continue. Pt's safety has been maintained.   09/24/20 2146  Psych Admission Type (Psych Patients Only)  Admission Status Voluntary  Psychosocial Assessment  Patient Complaints Substance abuse  Eye Contact Brief  Facial Expression Anxious;Sad;Flat  Affect Anxious;Appropriate to circumstance;Sad;Depressed  Speech Logical/coherent  Interaction Assertive  Motor Activity Fidgety;Slow  Appearance/Hygiene Disheveled  Behavior Characteristics Cooperative;Appropriate to situation;Anxious  Mood Depressed;Anxious;Pleasant;Sad  Thought Process  Coherency WDL  Content WDL  Delusions None reported or observed  Perception WDL  Hallucination None reported or observed  Judgment Impaired  Confusion None  Danger to Self  Current suicidal ideation? Denies  Self-Injurious Behavior No self-injurious ideation or behavior indicators observed or expressed   Agreement Not to Harm Self Yes  Description of Agreement contracts for safety verbally  Danger to Others  Danger to Others None reported or observed

## 2020-09-25 NOTE — Progress Notes (Signed)
Psychoeducational Group Note  Date:  09/25/2020 Time:  2015  Group Topic/Focus:  Wrap up group  Participation Level: Did Not Attend  Participation Quality:  Not Applicable  Affect:  Not Applicable  Cognitive:  Not Applicable  Insight:  Not Applicable  Engagement in Group: Not Applicable  Additional Comments:  Did not attend.   Marcille Buffy 09/25/2020, 8:51 PM
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
## 2020-09-25 NOTE — Progress Notes (Signed)
   09/25/20 0940  Psych Admission Type (Psych Patients Only)  Admission Status Voluntary  Psychosocial Assessment  Patient Complaints Irritability  Eye Contact Brief  Facial Expression Anxious;Sad;Flat  Affect Anxious;Sad;Depressed  Speech Logical/coherent  Interaction Assertive  Motor Activity Fidgety;Slow  Appearance/Hygiene Disheveled  Behavior Characteristics Anxious  Mood Depressed;Anxious  Thought Process  Coherency WDL  Content WDL  Delusions None reported or observed  Perception WDL  Hallucination None reported or observed  Judgment Impaired  Confusion None  Danger to Self  Current suicidal ideation? Denies  Self-Injurious Behavior No self-injurious ideation or behavior indicators observed or expressed   Agreement Not to Harm Self Yes  Description of Agreement contracts for safety verbally  Danger to Others  Danger to Others None reported or observed

## 2020-09-25 NOTE — Progress Notes (Signed)
Pt did not attend group after verbal prompt. 

## 2020-09-26 ENCOUNTER — Encounter (HOSPITAL_COMMUNITY): Payer: Self-pay

## 2020-09-26 DIAGNOSIS — R45850 Homicidal ideations: Secondary | ICD-10-CM | POA: Diagnosis not present

## 2020-09-26 DIAGNOSIS — R45851 Suicidal ideations: Secondary | ICD-10-CM | POA: Diagnosis not present

## 2020-09-26 DIAGNOSIS — F1994 Other psychoactive substance use, unspecified with psychoactive substance-induced mood disorder: Secondary | ICD-10-CM

## 2020-09-26 LAB — BASIC METABOLIC PANEL
Anion gap: 6 (ref 5–15)
BUN: 19 mg/dL (ref 6–20)
CO2: 27 mmol/L (ref 22–32)
Calcium: 9.1 mg/dL (ref 8.9–10.3)
Chloride: 103 mmol/L (ref 98–111)
Creatinine, Ser: 0.91 mg/dL (ref 0.61–1.24)
GFR, Estimated: >60 mL/min (ref >60)
Glucose, Bld: 193 mg/dL — ABNORMAL HIGH (ref 70–99)
Potassium: 4.4 mmol/L (ref 3.5–5.1)
Sodium: 136 mmol/L (ref 135–145)

## 2020-09-26 LAB — HEPATIC FUNCTION PANEL
ALT: 39 U/L (ref 0–44)
AST: 17 U/L (ref 15–41)
Albumin: 4 g/dL (ref 3.5–5.0)
Alkaline Phosphatase: 46 U/L (ref 38–126)
Bilirubin, Direct: <0.1 mg/dL (ref 0.0–0.2)
Total Bilirubin: 0.4 mg/dL (ref 0.3–1.2)
Total Protein: 7.5 g/dL (ref 6.5–8.1)

## 2020-09-26 LAB — GLUCOSE, CAPILLARY
Glucose-Capillary: 251 mg/dL — ABNORMAL HIGH (ref 70–99)
Glucose-Capillary: 317 mg/dL — ABNORMAL HIGH (ref 70–99)
Glucose-Capillary: 228 mg/dL — ABNORMAL HIGH (ref 70–99)

## 2020-09-26 LAB — HEMOGLOBIN A1C
Hgb A1c MFr Bld: 6.8 % — ABNORMAL HIGH (ref 4.8–5.6)
Mean Plasma Glucose: 148.46 mg/dL

## 2020-09-26 LAB — GLUCOSE, RANDOM: Glucose, Bld: 208 mg/dL — ABNORMAL HIGH (ref 70–99)

## 2020-09-26 MED ORDER — BISACODYL 5 MG PO TBEC
5.0000 mg | DELAYED_RELEASE_TABLET | Freq: Every day | ORAL | Status: DC | PRN
  Administered 2020-09-26 – 2020-09-28 (×3): 5 mg via ORAL
  Filled 2020-09-26 (×3): qty 1

## 2020-09-26 MED ORDER — METFORMIN HCL ER 500 MG PO TB24
500.0000 mg | ORAL_TABLET | Freq: Two times a day (BID) | ORAL | Status: DC
  Administered 2020-09-27 – 2020-09-29 (×5): 500 mg via ORAL
  Filled 2020-09-26: qty 1
  Filled 2020-09-26: qty 14
  Filled 2020-09-26 (×4): qty 1
  Filled 2020-09-26: qty 14
  Filled 2020-09-26 (×2): qty 1

## 2020-09-26 MED ORDER — INSULIN ASPART 100 UNIT/ML IJ SOLN
0.0000 [IU] | Freq: Three times a day (TID) | INTRAMUSCULAR | Status: DC
  Administered 2020-09-26: 7 [IU] via SUBCUTANEOUS
  Administered 2020-09-27 (×2): 2 [IU] via SUBCUTANEOUS
  Administered 2020-09-27: 3 [IU] via SUBCUTANEOUS
  Administered 2020-09-28: 2 [IU] via SUBCUTANEOUS
  Administered 2020-09-28 (×2): 3 [IU] via SUBCUTANEOUS
  Administered 2020-09-29: 2 [IU] via SUBCUTANEOUS
  Administered 2020-09-29: 3 [IU] via SUBCUTANEOUS

## 2020-09-26 NOTE — Progress Notes (Addendum)
[facility] MD Progress Note  09/26/2020 1:11 PM Joshua Thomas  MRN:  [id number] Subjective:  Patient is a 45 yo male w/ extensive psychiatric hx of polysubstance abuse, substance induced mood disorder, attempted suicide with heroin, multiple ED visits for substance abuse, and multiple psychiatric hospitalization presents unaccompanied to [facility] as a walk-in due to SI, HI, and substance rehabilitation.  Chart Review, 24 hr Events: The patient's chart was reviewed and nursing notes were reviewed. The patient's case was discussed in multidisciplinary team meeting.  Per MAR: - Patient is compliant with scheduled meds. - PRNs used: hydroxyzine, maalox Per RN notes, no documented behavioral issues and is attending group. Patient slept, 3.5 hours  TODAY'S INTERVIEW (09/26/2020) Patient was seen and assessed with Dr. Mason Jim attending.  Patient states that he was "doing well".  Patient states that he was feeling better after resting for most of yesterday.  Patient denies any withdrawal symptoms and had no physical complaints at this time.  Patient verbalized understanding that he would unlikely be accepted into the BATS program while hospitalized here.  Patient still interested in being accepted into [facility].  Informed patient that we are working towards lowering his glucose so that he would be accepted at [facility].  Patient understands and is agreeable to all treatments.  Encourage patient to attend group therapy while he is here.  Patient agreeable to additional labs in order to assess liver function.  Patient denies SI/HI/AVH, paranoia, delusions, thought insertion, transferring symptoms at this time.  Principal Problem: <principal problem not specified> Diagnosis: Active Problems:   Polysubstance abuse (HCC)   Opioid use disorder, moderate, dependence (HCC)   Chronic low back pain   MDD (major depressive disorder), recurrent severe, without psychosis (HCC)   Suicidal ideation   Opioid abuse (HCC)    Amphetamine abuse (HCC)   MDD (major depressive disorder)  Total Time spent with patient:  I personally spent 35 minutes on the unit in direct patient care. The direct patient care time included face-to-face time with the patient, reviewing the patient's chart, communicating with other professionals, and coordinating care. Greater than 50% of this time was spent in counseling or coordinating care with the patient regarding goals of hospitalization, psycho-education, and discharge planning needs.   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   COPD (chronic obstructive pulmonary disease) (HCC)    DDD (degenerative disc disease), lumbar    Degenerative disc disease, lumbar    Depression    Discitis    GERD (gastroesophageal reflux disease)    Hepatitis C    Hepatitis C    History of stomach ulcers    Hx of diabetes mellitus    due to infection   Hypertension    Lung nodules    3 on L and 2 on R   PTSD (post-traumatic stress disorder)    Sciatica    Substance abuse (HCC)     Past Surgical History:  Procedure Laterality Date   FRACTURE SURGERY     HARDWARE REMOVAL  01/27/2012   Procedure: HARDWARE REMOVAL;  Surgeon: Kathryne Hitch, MD;  Location: [facility];  Service: Orthopedics;  Laterality: Left;   IR LUMBAR DISC ASPIRATION W/IMG GUIDE  06/08/2017   RADIOLOGY WITH ANESTHESIA N/A 06/08/2017   Procedure: DISC ASPIRATION;  Surgeon: Julieanne Cotton, MD;  Location: [facility] OR;  Service: Radiology;  Laterality: N/A;   TIBIA IM NAIL INSERTION  01/27/2012   Procedure: INTRAMEDULLARY (IM) NAIL TIBIAL;  Surgeon: Kathryne Hitch, MD;  Location: [facility];  Service: Orthopedics;  Laterality: Left;  Removal of IM Rod and Screws Left  Tibia with Exchange Nail, Allograft Bone Graft Left Tibia   Family History:  Family History  Problem Relation Age of Onset   Diabetes Mother    Alcohol abuse Father    Cirrhosis Father    Aneurysm Father    Family Psychiatric  History: see H&P Social History:  Social History   Substance and Sexual Activity  Alcohol Use Yes   Alcohol/week: 1.0 standard drink   Types: 1 Standard drinks or equivalent per week   Comment: occasionally     Social History   Substance and Sexual Activity  Drug Use Yes   Frequency: 7.0 times per week   Types: IV, Methamphetamines, Heroin   Comment: heroin daily, meth daily    Social History   Socioeconomic History   Marital status: Single    Spouse name: Not on file   Number of children: Not on file   Years of education: 12   Highest education level: High school graduate  Occupational History   Not on file  Tobacco Use   Smoking status: Every Day    Packs/day: 1.00    Years: 33.00    Pack years: 33.00    Types: Cigarettes   Smokeless tobacco: Former    Quit date: 08/08/2006   Tobacco comments:    1 PPD  Substance and Sexual Activity   Alcohol use: Yes    Alcohol/week: 1.0 standard drink    Types: 1 Standard drinks or equivalent per week    Comment: occasionally   Drug use: Yes    Frequency: 7.0 times per week    Types: IV, Methamphetamines, Heroin    Comment: heroin daily, meth daily   Sexual activity: Not Currently  Other Topics Concern   Not on file  Social History Narrative   Not on file   Social Determinants of Health   Financial Resource Strain: Not on file  Food Insecurity: Not on file  Transportation Needs: Not on file  Physical Activity: Not on file  Stress: Not on file  Social Connections: Not on file   Additional Social History:                         Sleep: Good  Appetite:  Good  Current Medications: Current Facility-Administered Medications  Medication Dose Route Frequency Provider Last  Rate Last Admin   acetaminophen (TYLENOL) tablet 650 mg  650 mg Oral Q6H PRN Novella Olive, NP       alum & mag hydroxide-simeth (MAALOX/MYLANTA) 200-200-20 MG/5ML suspension 30 mL  30 mL Oral Q4H PRN Novella Olive, NP   30 mL at 09/25/20 2121   cloNIDine (CATAPRES) tablet 0.1 mg  0.1 mg Oral QID Novella Olive, NP   0.1 mg at 09/26/20 3532   Followed by   cloNIDine (CATAPRES) tablet 0.1 mg  0.1 mg Oral Larose Hires, NP       Followed by   Melene Muller ON 09/29/2020] cloNIDine (CATAPRES) tablet 0.1 mg  0.1 mg Oral QAC breakfast Novella Olive, NP       dicyclomine (  BENTYL) tablet 20 mg  20 mg Oral Q6H PRN Novella Olive, NP       gabapentin (NEURONTIN) capsule 300 mg  300 mg Oral TID Novella Olive, NP   300 mg at 09/26/20 7902   hydrOXYzine (ATARAX/VISTARIL) tablet 25 mg  25 mg Oral Q6H PRN Novella Olive, NP   25 mg at 09/25/20 0940   loperamide (IMODIUM) capsule 2-4 mg  2-4 mg Oral PRN Novella Olive, NP       magnesium hydroxide (MILK OF MAGNESIA) suspension 30 mL  30 mL Oral Daily PRN Novella Olive, NP       metFORMIN (GLUCOPHAGE-XR) 24 hr tablet 500 mg  500 mg Oral Q supper Novella Olive, NP   500 mg at 09/25/20 1716   methocarbamol (ROBAXIN) tablet 500 mg  500 mg Oral Q8H PRN Novella Olive, NP       naproxen (NAPROSYN) tablet 500 mg  500 mg Oral BID PRN Novella Olive, NP   500 mg at 09/24/20 2146   nicotine (NICODERM CQ - dosed in mg/24 hours) patch 21 mg  21 mg Transdermal Q0600 Novella Olive, NP   21 mg at 09/26/20 0940   ondansetron (ZOFRAN-ODT) disintegrating tablet 4 mg  4 mg Oral Q6H PRN Novella Olive, NP       sertraline (ZOLOFT) tablet 50 mg  50 mg Oral QHS Park Pope, MD       traZODone (DESYREL) tablet 50 mg  50 mg Oral QHS PRN Novella Olive, NP        Lab Results:  Results for orders placed or performed during the hospital encounter of 09/24/20 (from the past 48 hour(s))  Lipid panel     Status: Abnormal   Collection Time: 09/25/20  6:31 AM  Result Value Ref  Range   Cholesterol 137 0 - 200 mg/dL   Triglycerides 61 <409 mg/dL   HDL 32 (L) >73 mg/dL   Total CHOL/HDL Ratio 4.3 RATIO   VLDL 12 0 - 40 mg/dL   LDL Cholesterol 93 0 - 99 mg/dL    Comment:        Total Cholesterol/HDL:CHD Risk Coronary Heart Disease Risk Table                     Men   Women  1/2 Average Risk   3.4   3.3  Average Risk       5.0   4.4  2 X Average Risk   9.6   7.1  3 X Average Risk  23.4   11.0        Use the calculated Patient Ratio above and the CHD Risk Table to determine the patient's CHD Risk.        ATP III CLASSIFICATION (LDL):  <100     mg/dL   Optimal  532-992  mg/dL   Near or Above                    Optimal  130-159  mg/dL   Borderline  426-834  mg/dL   High  >196     mg/dL   Very High Performed at [facility], [address], [place], [place] [address]   Hemoglobin A1c     Status: Abnormal   Collection Time: 09/26/20  6:10 AM  Result Value Ref Range   Hgb A1c MFr Bld 6.8 (H) 4.8 - 5.6 %    Comment: (  NOTE) Pre diabetes:          5.7%-6.4%  Diabetes:              >6.4%  Glycemic control for   <7.0% adults with diabetes    Mean Plasma Glucose 148.46 mg/dL    Comment: Performed at [facility], [address] [place], [place] [address]  Basic metabolic panel     Status: Abnormal   Collection Time: 09/26/20  6:10 AM  Result Value Ref Range   Sodium 136 135 - 145 mmol/L   Potassium 4.4 3.5 - 5.1 mmol/L   Chloride 103 98 - 111 mmol/L   CO2 27 22 - 32 mmol/L   Glucose, Bld 193 (H) 70 - 99 mg/dL    Comment: Glucose reference range applies only to samples taken after fasting for at least 8 hours.   BUN 19 6 - 20 mg/dL   Creatinine, Ser 3.29 0.61 - 1.24 mg/dL   Calcium 9.1 8.9 - 51.8 mg/dL   GFR, Estimated >84 >16 mL/min    Comment: (NOTE) Calculated using the CKD-EPI Creatinine Equation (2021)    Anion gap 6 5 - 15    Comment: Performed at [facility], [address], [place], [place]  [address]    Blood Alcohol level:  Lab Results  Component Value Date   ETH <10 09/23/2020   ETH <10 05/10/2019    Metabolic Disorder Labs: Lab Results  Component Value Date   HGBA1C 6.8 (H) 09/26/2020   MPG 148.46 09/26/2020   MPG 151.33 08/17/2020   Lab Results  Component Value Date   PROLACTIN 31.3 (H) 05/23/2015   Lab Results  Component Value Date   CHOL 137 09/25/2020   TRIG 61 09/25/2020   HDL 32 (L) 09/25/2020   CHOLHDL 4.3 09/25/2020   VLDL 12 09/25/2020   LDLCALC 93 09/25/2020   LDLCALC 87 05/23/2015    Physical Findings: AIMS: Facial and Oral Movements Muscles of Facial Expression: None, normal Lips and Perioral Area: None, normal Jaw: None, normal Tongue: None, normal,Extremity Movements Upper (arms, wrists, hands, fingers): None, normal Lower (legs, knees, ankles, toes): None, normal, Trunk Movements Neck, shoulders, hips: None, normal, Overall Severity Severity of abnormal movements (highest score from questions above): None, normal Incapacitation due to abnormal movements: None, normal Patient's awareness of abnormal movements (rate only patient's report): No Awareness, Dental Status Current problems with teeth and/or dentures?: No Does patient usually wear dentures?: No  CIWA:  CIWA-Ar Total: 2 COWS:  COWS Total Score: 0  Musculoskeletal: Strength & Muscle Tone: normal Gait & Station:  unable to assess, patient was resting in bed Patient leans: N/A  Psychiatric Specialty Exam:  Presentation  General Appearance: casually dressed, fair hygiene  Eye Contact:Good  Speech:Clear and Coherent  Speech Volume:Normal  Handedness:Right   Mood and Affect  Mood:Depressed; Anxious  Affect:Constricted   Thought Process  Thought Processes:Coherent  Descriptions of Associations:superficially linear and goal directed  Orientation:Full (Time, Place and Person)  Thought Content:Logical - denies AVH, paranoia, or delusions  History of  Schizophrenia/Schizoaffective disorder:No  Duration of Psychotic Symptoms:No data recorded Hallucinations:Hallucinations: None  Ideas of Reference:None  Suicidal Thoughts:Suicidal Thoughts: No  Homicidal Thoughts:Homicidal Thoughts: No   Sensorium  Memory:Immediate Fair; Recent Fair; Remote Fair  Judgment:Poor  Insight:Fair   Executive Functions  Concentration:Fair  Attention Span:Fair  Recall:Fair  Fund of Knowledge:Fair  Language:Fair   Psychomotor Activity  Psychomotor Activity:Psychomotor Activity: Normal   Assets  Assets:Communication Skills; Desire for  Improvement; Social Support   Sleep  Sleep:Sleep: Fair    Physical Exam: Physical Exam Vitals and nursing note reviewed.  Constitutional:      Appearance: Normal appearance. He is normal weight.  HENT:     Head: Normocephalic and atraumatic.  Pulmonary:     Effort: Pulmonary effort is normal.  Neurological:     General: No focal deficit present.     Mental Status: He is oriented to person, place, and time.   Review of Systems  Respiratory:  Negative for shortness of breath.   Cardiovascular:  Negative for chest pain.  Gastrointestinal:  Negative for abdominal pain, constipation, diarrhea, heartburn, nausea and vomiting.  Neurological:  Negative for headaches.  Blood pressure 106/84, pulse 80, temperature 97.6 F (36.4 C), temperature source Oral, resp. rate 18, SpO2 99 %. There is no height or weight on file to calculate BMI.   Treatment Plan Summary: Daily contact with patient to assess and evaluate symptoms and progress in treatment and Medication management  ASSESSMENT Patient is a 45 yo male w/ extensive psychiatric hx of polysubstance abuse, substance induced mood disorder, attempted suicide with heroin, multiple ED visits for substance abuse, and multiple psychiatric hospitalization presents unaccompanied to [facility] as a walk-in due to SI, HI, and substance rehabilitation.  Patient appears  less withdrawn today.  Patient denies SI/HI/AVH.  Patient agreeable to going back to [facility] for intake while awaiting BATS program.  PLAN Psychiatric Problems MDD, Recurrent, Severe w/o psychosis -Zoloft 50 mg daily for depression   Opiate and Stimulant use d/o - amphetamine type: -Bentyl 20 mg q6 PRN -Robaxin 500 mg q8 PRN -Naproxen 500 mg BID PRN -Zofran-ODT 4 mg q6 PRN -Imodium 2mg  PRN -Vistaril 25mg  q6 hours PRN anxiety -Gabapentin to 300 mg TID - Continue scheduled Clonidine taper -COWS: 12, mild withdrawal - SW looking into residential rehab options   Nicotine Dependence: -Continue Nicotine patch 21 mg daily. Remove patch at night.    Insomnia Continue trazodone 100 mg nightly as needed.  PRNs: Tylenol, Maalox, Atarax, Milk of Magnesia  Medical Management Lipid panel: HDL 32  Type 2 Diabetes Mellitus -A1C 6.8 (9/16) -Random fasting blood glucose 251 (9/16) -Consulted Diabetes Coordinator, appreciate recs -Metformin 500 mg bid -SSI sensitive  Elevated ALT -ALT 73 (9/13) -Hepatitis panel, hepatic function panel pending  Park Pope, MD 09/26/2020, 1:11 PM

## 2020-09-26 NOTE — BHH Counselor (Signed)
CSW spoke with Joe at [employer] regarding this pt's previous referral to their BATS program. Joe recently stepped into this role as his counterpart is on FMLA. Joe reports he will look into this referral and give CSW a return call to determine if a new application will need to be completed.    Ruthann Cancer MSW, LCSW Clincal Social Worker  [facility]

## 2020-09-26 NOTE — Group Note (Signed)
LCSW Group Therapy Notes   Type of Therapy and Topic: Group Therapy: Worry and Anxiety  Participation Level: BHH PARTICIPATION LEVEL: Did not attend  Description of Group: In this group, patients will be encouraged to explore their worry around what could happen vs what will happen. Each patient will be challenged to think of personal worries and how they will work their way through that worry around what will happen and what could happen. This group will be process-oriented, with patients participating in exploration of their own experiences as well as giving and receiving support and challenge from other group members.  Therapeutic Goals: Patient will identify personal worries that cause anxiety. Patient will identify clues to identify their worry. Patient will identify ways to handle their worry. Patient will discuss ways that their worry has deceased or why it has not decreased.   Summary of Patient Progress: Did not attend   Therapeutic Modalities:  Cognitive Behavioral Therapy Solution Focused Therapy Motivational Interviewing    Shaday Rayborn, LCSW, LCAS Clincal Social Worker  [facility]  

## 2020-09-26 NOTE — Progress Notes (Signed)
Inpatient Diabetes Program Recommendations  AACE/ADA: New Consensus Statement on Inpatient Glycemic Control (2015)  Target Ranges:  Prepandial:   less than 140 mg/dL      Peak postprandial:   less than 180 mg/dL (1-2 hours)      Critically ill patients:  140 - 180 mg/dL   Lab Results  Component Value Date   GLUCAP 251 (H) 09/26/2020   HGBA1C 6.8 (H) 09/26/2020    Review of Glycemic Control Results for Joshua Thomas, Joshua Thomas (MRN [id number]) as of 09/26/2020 15:33  Ref. Range 09/26/2020 14:41  Glucose-Capillary Latest Ref Range: 70 - 99 mg/dL 370 (H)   Diabetes history: Type 2 DM Outpatient Diabetes medications: Metformin 500 mg BID Current orders for Inpatient glycemic control: Novolog 0-9 units TID, Metformin 500 mg QPM  Inpatient Diabetes Program Recommendations:    Noted consult. Consider increasing Metformin to 500 mg BID (home dose).  Noted addition of sliding scale, in agreement.  Could benefit from [facility] for PCP follow up?  Per ADA guidelines, A1C is at goal, however, glucose levels here are elevated. Will follow trends.   Thanks, Lujean Rave, MSN, RNC-OB Diabetes Coordinator [phone] (8a-5p)

## 2020-09-26 NOTE — Progress Notes (Signed)
D: Pt denies SI/HI/AVH and verbally agrees to approach staff if these become apparent or before harming themselves/others. Rates depression 6/10. Rates anxiety. 6/10. Rates pain 7/10. Patient has been in bed for most of the day. Came out of room for a blood glucose check. Patient stated that he was having generalized pain and stated "I want to go to group but I feel irritable and don't want to disturb anyone." Scheduled medications administered to Pt, per MD orders. RN checked blood glucose and let the MD know of his level.  RN provided support and encouragement to Pt. Q15 min safety checks implemented and continued. Pt safe on the unit. RN will continue to monitor and intervene as needed.     09/26/20 0940  Psych Admission Type (Psych Patients Only)  Admission Status Voluntary  Psychosocial Assessment  Patient Complaints Irritability (pain)  Eye Contact Fair  Facial Expression Flat  Affect Depressed  Speech Logical/coherent  Interaction Assertive;Minimal  Motor Activity Slow  Appearance/Hygiene Unremarkable  Behavior Characteristics Calm;Cooperative  Mood Pleasant  Thought Process  Coherency WDL  Content WDL  Delusions None reported or observed  Perception WDL  Hallucination None reported or observed  Judgment Limited  Confusion None  Danger to Self  Current suicidal ideation? Denies  Self-Injurious Behavior No self-injurious ideation or behavior indicators observed or expressed   Agreement Not to Harm Self Yes  Description of Agreement contracts for safety verbally  Danger to Others  Danger to Others None reported or observed

## 2020-09-26 NOTE — BH IP Treatment Plan (Signed)
Interdisciplinary Treatment and Diagnostic Plan Update  09/26/2020 Time of Session:  MAXWEL MEADOWCROFT MRN: [id number]  Principal Diagnosis: <principal problem not specified>  Secondary Diagnoses: Active Problems:   Polysubstance abuse (HCC)   Opioid use disorder, moderate, dependence (HCC)   Chronic low back pain   MDD (major depressive disorder), recurrent severe, without psychosis ([place])   Suicidal ideation   Opioid abuse ([place])   Amphetamine abuse ([place])   MDD (major depressive disorder)   Current Medications:  Current Facility-Administered Medications  Medication Dose Route Frequency Provider Last Rate Last Admin   acetaminophen (TYLENOL) tablet 650 mg  650 mg Oral Q6H PRN Chalmers Guest, NP       alum & mag hydroxide-simeth (MAALOX/MYLANTA) 200-200-20 MG/5ML suspension 30 mL  30 mL Oral Q4H PRN Chalmers Guest, NP   30 mL at 09/25/20 2121   cloNIDine (CATAPRES) tablet 0.1 mg  0.1 mg Oral QID Chalmers Guest, NP   0.1 mg at 09/26/20 1400   Followed by   cloNIDine (CATAPRES) tablet 0.1 mg  0.1 mg Oral Nigel Sloop, NP       Followed by   Derrill Memo ON 09/29/2020] cloNIDine (CATAPRES) tablet 0.1 mg  0.1 mg Oral QAC breakfast Chalmers Guest, NP       dicyclomine (BENTYL) tablet 20 mg  20 mg Oral Q6H PRN Chalmers Guest, NP       gabapentin (NEURONTIN) capsule 300 mg  300 mg Oral TID Chalmers Guest, NP   300 mg at 09/26/20 1400   hydrOXYzine (ATARAX/VISTARIL) tablet 25 mg  25 mg Oral Q6H PRN Chalmers Guest, NP   25 mg at 09/25/20 0940   loperamide (IMODIUM) capsule 2-4 mg  2-4 mg Oral PRN Chalmers Guest, NP       magnesium hydroxide (MILK OF MAGNESIA) suspension 30 mL  30 mL Oral Daily PRN Chalmers Guest, NP       metFORMIN (GLUCOPHAGE-XR) 24 hr tablet 500 mg  500 mg Oral Q supper Chalmers Guest, NP   500 mg at 09/25/20 1716   methocarbamol (ROBAXIN) tablet 500 mg  500 mg Oral Q8H PRN Chalmers Guest, NP   500 mg at 09/26/20 1400   naproxen (NAPROSYN) tablet 500 mg  500 mg Oral BID  PRN Chalmers Guest, NP   500 mg at 09/26/20 1359   nicotine (NICODERM CQ - dosed in mg/24 hours) patch 21 mg  21 mg Transdermal Q0600 Chalmers Guest, NP   21 mg at 09/26/20 0940   ondansetron (ZOFRAN-ODT) disintegrating tablet 4 mg  4 mg Oral Q6H PRN Chalmers Guest, NP       sertraline (ZOLOFT) tablet 50 mg  50 mg Oral QHS France Ravens, MD       traZODone (DESYREL) tablet 50 mg  50 mg Oral QHS PRN Chalmers Guest, NP       PTA Medications: Medications Prior to Admission  Medication Sig Dispense Refill Last Dose   amLODipine (NORVASC) 5 MG tablet Take 1 tablet (5 mg total) by mouth daily for 14 days. 30 tablet 0    Aspirin-Acetaminophen-Caffeine (GOODY HEADACHE PO) Take 1 packet by mouth daily as needed (for headaches or mild pain).      gabapentin (NEURONTIN) 300 MG capsule Take 1 capsule (300 mg total) by mouth 3 (three) times daily. 90 capsule 0    metFORMIN (GLUCOPHAGE XR) 500 MG 24 hr tablet Take 1 tablet (500 mg total) by  mouth daily with supper. 30 tablet 0    naloxone (NARCAN) nasal spray 4 mg/0.1 mL Take in case of overdose 1 each 0    polyethylene glycol (MIRALAX / GLYCOLAX) 17 g packet Take 17 g by mouth daily as needed for moderate constipation. (Patient taking differently: Take 17 g by mouth daily as needed for moderate constipation (mix as directed, then drink).) 30 each 0    sertraline (ZOLOFT) 50 MG tablet Take 1 tablet (50 mg total) by mouth daily for 14 days. 30 tablet 0    traZODone (DESYREL) 100 MG tablet Take 1 tablet (100 mg total) by mouth at bedtime as needed for up to 14 days for sleep. 30 tablet 0     Patient Stressors:    Patient Strengths:    Treatment Modalities: Medication Management, Group therapy, Case management,  1 to 1 session with clinician, Psychoeducation, Recreational therapy.   Physician Treatment Plan for Primary Diagnosis: <principal problem not specified> Long Term Goal(s): Improvement in symptoms so as ready for discharge   Short Term Goals:  Ability to identify changes in lifestyle to reduce recurrence of condition will improve Ability to verbalize feelings will improve Ability to disclose and discuss suicidal ideas Ability to demonstrate self-control will improve Ability to identify and develop effective coping behaviors will improve Ability to maintain clinical measurements within normal limits will improve Compliance with prescribed medications will improve Ability to identify triggers associated with substance abuse/mental health issues will improve  Medication Management: Evaluate patient's response, side effects, and tolerance of medication regimen.  Therapeutic Interventions: 1 to 1 sessions, Unit Group sessions and Medication administration.  Evaluation of Outcomes: Not Met  Physician Treatment Plan for Secondary Diagnosis: Active Problems:   Polysubstance abuse ([place])   Opioid use disorder, moderate, dependence (HCC)   Chronic low back pain   MDD (major depressive disorder), recurrent severe, without psychosis ([place])   Suicidal ideation   Opioid abuse ([place])   Amphetamine abuse ([place])   MDD (major depressive disorder)  Long Term Goal(s): Improvement in symptoms so as ready for discharge   Short Term Goals: Ability to identify changes in lifestyle to reduce recurrence of condition will improve Ability to verbalize feelings will improve Ability to disclose and discuss suicidal ideas Ability to demonstrate self-control will improve Ability to identify and develop effective coping behaviors will improve Ability to maintain clinical measurements within normal limits will improve Compliance with prescribed medications will improve Ability to identify triggers associated with substance abuse/mental health issues will improve     Medication Management: Evaluate patient's response, side effects, and tolerance of medication regimen.  Therapeutic Interventions: 1 to 1 sessions, Unit Group sessions and Medication  administration.  Evaluation of Outcomes: Not Met   RN Treatment Plan for Primary Diagnosis: <principal problem not specified> Long Term Goal(s): Knowledge of disease and therapeutic regimen to maintain health will improve  Short Term Goals: Ability to demonstrate self-control, Ability to participate in decision making will improve, and Ability to verbalize feelings will improve  Medication Management: RN will administer medications as ordered by provider, will assess and evaluate patient's response and provide education to patient for prescribed medication. RN will report any adverse and/or side effects to prescribing provider.  Therapeutic Interventions: 1 on 1 counseling sessions, Psychoeducation, Medication administration, Evaluate responses to treatment, Monitor vital signs and CBGs as ordered, Perform/monitor CIWA, COWS, AIMS and Fall Risk screenings as ordered, Perform wound care treatments as ordered.  Evaluation of Outcomes: Not Met   LCSW Treatment  Plan for Primary Diagnosis: <principal problem not specified> Long Term Goal(s): Safe transition to appropriate next level of care at discharge, Engage patient in therapeutic group addressing interpersonal concerns.  Short Term Goals: Engage patient in aftercare planning with referrals and resources, Increase social support, and Increase ability to appropriately verbalize feelings  Therapeutic Interventions: Assess for all discharge needs, 1 to 1 time with Social worker, Explore available resources and support systems, Assess for adequacy in community support network, Educate family and significant other(s) on suicide prevention, Complete Psychosocial Assessment, Interpersonal group therapy.  Evaluation of Outcomes: Not Met   Progress in Treatment: Attending groups: No. Participating in groups: No. Taking medication as prescribed: Yes. Toleration medication: Yes. Family/Significant other contact made: No, will contact:   mother Patient understands diagnosis: Yes. Discussing patient identified problems/goals with staff: Yes. Medical problems stabilized or resolved: Yes. Denies suicidal/homicidal ideation: Yes. Issues/concerns per patient self-inventory: No. Other: None  New problem(s) identified: No, Describe:  None  New Short Term/Long Term Goal(s):medication stabilization, elimination of SI thoughts, development of comprehensive mental wellness plan.   Patient Goals:  Did Not Attend  Discharge Plan or Barriers: Pt is being referred to [facility] and [facility]  Reason for Continuation of Hospitalization: Medication stabilization Suicidal ideation Withdrawal symptoms  Estimated Length of Stay: 3-5 days   Scribe for Treatment Team: Eliott Nine 09/26/2020 2:27 PM

## 2020-09-26 NOTE — Group Note (Signed)
Recreation Therapy Group Note   Group Topic:Stress Management  Group Date: 09/26/2020 Start Time: 0930 End Time: 0950 Facilitators: Caroll Rancher, LRT/CTRS Location: [address] Dayroom  Goal Area(s) Addresses:  Patient will identify positive stress management techniques. Patient will identify benefits of using stress management post d/c.  Group Description: Meditation.  LRT played a meditation that focused on letting go of anxiety.  The meditation encouraged patients to release the things that were out of their control and to acknowledge any feels or thoughts they have without letting it overtake them.  Patients were to follow as meditation played to fully engage in activity.   Affect/Mood: N/A   Participation Level: Did not attend    Clinical Observations/Individualized Feedback: Pt did not attend group session.    Plan: Continue to engage patient in RT group sessions 2-3x/week.   Caroll Rancher, LRT/CTRS 09/26/2020 12:03 PM

## 2020-09-26 NOTE — [facility] Group Notes (Signed)
Adult Psychoeducational Group Note  Date:  09/26/2020 Time:  11:31 AM  Group Topic/Focus:  Goals Group:   The focus of this group is to help patients establish daily goals to achieve during treatment and discuss how the patient can incorporate goal setting into their daily lives to aide in recovery.  Participation Level:  Did Not Attend  [employer] [facility] Chozen [place] 09/26/2020, 11:31 AM 

## 2020-09-26 NOTE — Progress Notes (Signed)
   09/25/20 2324  Psych Admission Type (Psych Patients Only)  Admission Status Voluntary  Psychosocial Assessment  Patient Complaints None  Eye Contact Brief  Facial Expression Anxious;Sad;Flat  Affect Anxious;Sad;Depressed  Speech Logical/coherent  Interaction Assertive  Motor Activity Fidgety;Slow  Appearance/Hygiene Disheveled  Behavior Characteristics Cooperative;Calm  Mood Pleasant  Thought Process  Coherency WDL  Content WDL  Delusions None reported or observed  Perception WDL  Hallucination None reported or observed  Judgment Impaired  Confusion None  Danger to Self  Current suicidal ideation? Denies  Self-Injurious Behavior No self-injurious ideation or behavior indicators observed or expressed   Agreement Not to Harm Self Yes  Description of Agreement contracts for safety verbally  Danger to Others  Danger to Others None reported or observed

## 2020-09-27 DIAGNOSIS — R45851 Suicidal ideations: Secondary | ICD-10-CM | POA: Diagnosis not present

## 2020-09-27 DIAGNOSIS — F1994 Other psychoactive substance use, unspecified with psychoactive substance-induced mood disorder: Secondary | ICD-10-CM | POA: Diagnosis not present

## 2020-09-27 DIAGNOSIS — R45850 Homicidal ideations: Secondary | ICD-10-CM | POA: Diagnosis not present

## 2020-09-27 LAB — HEPATITIS PANEL, ACUTE
HCV Ab: REACTIVE — AB
Hep A IgM: NONREACTIVE
Hep B C IgM: NONREACTIVE
Hepatitis B Surface Ag: NONREACTIVE

## 2020-09-27 LAB — GLUCOSE, CAPILLARY
Glucose-Capillary: 193 mg/dL — ABNORMAL HIGH (ref 70–99)
Glucose-Capillary: 154 mg/dL — ABNORMAL HIGH (ref 70–99)
Glucose-Capillary: 249 mg/dL — ABNORMAL HIGH (ref 70–99)
Glucose-Capillary: 172 mg/dL — ABNORMAL HIGH (ref 70–99)

## 2020-09-27 MED ORDER — POLYETHYLENE GLYCOL 3350 17 G PO PACK
17.0000 g | PACK | Freq: Every day | ORAL | Status: DC
  Administered 2020-09-27 – 2020-09-29 (×3): 17 g via ORAL
  Filled 2020-09-27 (×6): qty 1

## 2020-09-27 NOTE — [facility] Group Notes (Signed)
Pt received anger management pycho-ed group materials and was encouraged to come to staff to discuss it 1:1 once completed.  

## 2020-09-27 NOTE — [facility] Group Notes (Signed)
Adult Psychoeducational Group Note  Date:  09/27/2020 Time:  10:08 AM  Group Topic/Focus:  Goals Group:   The focus of this group is to help patients establish daily goals to achieve during treatment and discuss how the patient can incorporate goal setting into their daily lives to aide in recovery.  Participation Level:  Did Not Attend  Deforest Hoyles [facility] 09/27/2020, 10:08 AM

## 2020-09-27 NOTE — Progress Notes (Signed)
Inpatient Diabetes Program Recommendations  AACE/ADA: New Consensus Statement on Inpatient Glycemic Control (2015)  Target Ranges:  Prepandial:   less than 140 mg/dL      Peak postprandial:   less than 180 mg/dL (1-2 hours)      Critically ill patients:  140 - 180 mg/dL   Lab Results  Component Value Date   GLUCAP 154 (H) 09/27/2020   HGBA1C 6.8 (H) 09/26/2020    Review of Glycemic Control Results for Joshua Thomas, Joshua Thomas (MRN [id number]) as of 09/27/2020 13:45  Ref. Range 09/26/2020 21:34 09/27/2020 06:38 09/27/2020 11:46  Glucose-Capillary Latest Ref Range: 70 - 99 mg/dL 704 (H) 888 (H) 916 (H)   Diabetes history: DM 2 Outpatient Diabetes medications:  Metformin 500 mg daily-ran out Current orders for Inpatient glycemic control:  Novolog sensitive tid with meals Metformin XR 500 mg bid  Inpatient Diabetes Program Recommendations:    Agree with current orders.  A1C is actually great.  Recommend resumption of Metformin 500 mg bid at d/c.   Thanks,  Beryl Meager, RN, BC-ADM Inpatient Diabetes Coordinator Pager [phone]  (8a-5p)

## 2020-09-27 NOTE — Progress Notes (Addendum)
[facility] MD Progress Note  09/27/2020 3:10 PM Joshua Thomas  MRN:  [id number] Subjective:   Mr. Carrozza is a 45 yo male w/ extensive psychiatric hx of polysubstance abuse, substance induced mood disorder, attempted suicide with heroin, multiple ED visits for substance abuse, and multiple psychiatric hospitalization presents unaccompanied to [facility] as a walk-in due to SI, HI, and substance rehabilitation.   He reports that he is doing well today.  He reports that he slept decently last night.  He reports that his appetite remains well.  He reports no SI, HI, or AVH.  He reports that his withdrawal symptoms have improved.  He reports that his plan is still to return to the [facility] program when able to with the eventual and goal of being excepted into the BATS program at [facility].  Discussed with him that the soonest he would be able to go back would be Monday.  He was agreeable to this and had no other concerns at present.  Principal Problem: <principal problem not specified> Diagnosis: Active Problems:   Polysubstance abuse (HCC)   Opioid use disorder, moderate, dependence (HCC)   Chronic low back pain   MDD (major depressive disorder), recurrent severe, without psychosis (HCC)   Suicidal ideation   Opioid abuse (HCC)   Amphetamine abuse (HCC)   MDD (major depressive disorder)  Total Time spent with patient:  I personally spent 35 minutes on the unit in direct patient care. The direct patient care time included face-to-face time with the patient, reviewing the patient's chart, communicating with other professionals, and coordinating care. Greater than 50% of this time was spent in counseling or coordinating care with the patient regarding goals of hospitalization, psycho-education, and discharge planning needs.   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   COPD (chronic obstructive pulmonary disease) (HCC)    DDD (degenerative disc disease), lumbar    Degenerative disc disease, lumbar    Depression    Discitis    GERD (gastroesophageal reflux disease)    Hepatitis C    Hepatitis C    History of stomach ulcers    Hx of diabetes mellitus    due to infection   Hypertension    Lung nodules    3 on L and 2 on R   PTSD (post-traumatic stress disorder)    Sciatica    Substance abuse (HCC)     Past Surgical History:  Procedure Laterality Date   FRACTURE SURGERY     HARDWARE REMOVAL  01/27/2012   Procedure: HARDWARE REMOVAL;  Surgeon: Kathryne Hitch, MD;  Location: [facility];  Service: Orthopedics;  Laterality: Left;   IR LUMBAR DISC ASPIRATION W/IMG GUIDE  06/08/2017   RADIOLOGY WITH ANESTHESIA N/A 06/08/2017   Procedure: DISC ASPIRATION;  Surgeon: Julieanne Cotton, MD;  Location: [facility] OR;  Service: Radiology;  Laterality: N/A;   TIBIA IM NAIL INSERTION  01/27/2012   Procedure: INTRAMEDULLARY (IM) NAIL TIBIAL;  Surgeon: Kathryne Hitch, MD;  Location: [facility];  Service: Orthopedics;  Laterality: Left;  Removal of IM Rod and Screws Left Tibia with Exchange Nail,  Allograft Bone Graft Left Tibia   Family History:  Family History  Problem Relation Age of Onset   Diabetes Mother    Alcohol abuse Father    Cirrhosis Father    Aneurysm Father    Family Psychiatric  History: None Social History:  Social History   Substance and Sexual Activity  Alcohol Use Yes   Alcohol/week: 1.0 standard drink   Types: 1 Standard drinks or equivalent per week   Comment: occasionally     Social History   Substance and Sexual Activity  Drug Use Yes    Frequency: 7.0 times per week   Types: IV, Methamphetamines, Heroin   Comment: heroin daily, meth daily    Social History   Socioeconomic History   Marital status: Single    Spouse name: Not on file   Number of children: Not on file   Years of education: 12   Highest education level: High school graduate  Occupational History   Not on file  Tobacco Use   Smoking status: Every Day    Packs/day: 1.00    Years: 33.00    Pack years: 33.00    Types: Cigarettes   Smokeless tobacco: Former    Quit date: 08/08/2006   Tobacco comments:    1 PPD  Substance and Sexual Activity   Alcohol use: Yes    Alcohol/week: 1.0 standard drink    Types: 1 Standard drinks or equivalent per week    Comment: occasionally   Drug use: Yes    Frequency: 7.0 times per week    Types: IV, Methamphetamines, Heroin    Comment: heroin daily, meth daily   Sexual activity: Not Currently  Other Topics Concern   Not on file  Social History Narrative   Not on file   Social Determinants of Health   Financial Resource Strain: Not on file  Food Insecurity: Not on file  Transportation Needs: Not on file  Physical Activity: Not on file  Stress: Not on file  Social Connections: Not on file   Additional Social History:                         Sleep: Good  Appetite:  Good  Current Medications: Current Facility-Administered Medications  Medication Dose Route Frequency Provider Last Rate Last Admin   acetaminophen (TYLENOL) tablet 650 mg  650 mg Oral Q6H PRN Novella Olive, NP       alum & mag hydroxide-simeth (MAALOX/MYLANTA) 200-200-20 MG/5ML suspension 30 mL  30 mL Oral Q4H PRN Novella Olive, NP   30 mL at 09/25/20 2121   bisacodyl (DULCOLAX) EC tablet 5 mg  5 mg Oral Daily PRN Bobbitt, Shalon E, NP   5 mg at 09/27/20 0900   cloNIDine (CATAPRES) tablet 0.1 mg  0.1 mg Oral BH-qamhs Novella Olive, NP   0.1 mg at 09/27/20 0900   Followed by   Melene Muller ON 09/29/2020] cloNIDine (CATAPRES) tablet  0.1 mg  0.1 mg Oral QAC breakfast Novella Olive, NP       dicyclomine (BENTYL) tablet 20 mg  20 mg Oral Q6H PRN Novella Olive, NP       gabapentin (NEURONTIN) capsule 300 mg  300 mg Oral TID Novella Olive, NP   300 mg at 09/27/20 1152   hydrOXYzine (ATARAX/VISTARIL) tablet 25 mg  25 mg Oral Q6H PRN Novella Olive, NP   25 mg at 09/25/20 0940   insulin aspart (novoLOG) injection  0-9 Units  0-9 Units Subcutaneous TID WC Park Pope, MD   2 Units at 09/27/20 1152   loperamide (IMODIUM) capsule 2-4 mg  2-4 mg Oral PRN Novella Olive, NP       magnesium hydroxide (MILK OF MAGNESIA) suspension 30 mL  30 mL Oral Daily PRN Novella Olive, NP   30 mL at 09/26/20 2013   metFORMIN (GLUCOPHAGE-XR) 24 hr tablet 500 mg  500 mg Oral BID WC Park Pope, MD   500 mg at 09/27/20 0900   methocarbamol (ROBAXIN) tablet 500 mg  500 mg Oral Q8H PRN Novella Olive, NP   500 mg at 09/26/20 2158   naproxen (NAPROSYN) tablet 500 mg  500 mg Oral BID PRN Novella Olive, NP   500 mg at 09/27/20 0900   nicotine (NICODERM CQ - dosed in mg/24 hours) patch 21 mg  21 mg Transdermal Q0600 Novella Olive, NP   21 mg at 09/27/20 0900   ondansetron (ZOFRAN-ODT) disintegrating tablet 4 mg  4 mg Oral Q6H PRN Novella Olive, NP       sertraline (ZOLOFT) tablet 50 mg  50 mg Oral QHS Park Pope, MD   50 mg at 09/26/20 2157   traZODone (DESYREL) tablet 50 mg  50 mg Oral QHS PRN Novella Olive, NP   50 mg at 09/26/20 2158    Lab Results:  Results for orders placed or performed during the hospital encounter of 09/24/20 (from the past 48 hour(s))  Hemoglobin A1c     Status: Abnormal   Collection Time: 09/26/20  6:10 AM  Result Value Ref Range   Hgb A1c MFr Bld 6.8 (H) 4.8 - 5.6 %    Comment: (NOTE) Pre diabetes:          5.7%-6.4%  Diabetes:              >6.4%  Glycemic control for   <7.0% adults with diabetes    Mean Plasma Glucose 148.46 mg/dL    Comment: Performed at [facility], [address], [place], [place] [address]   Basic metabolic panel     Status: Abnormal   Collection Time: 09/26/20  6:10 AM  Result Value Ref Range   Sodium 136 135 - 145 mmol/L   Potassium 4.4 3.5 - 5.1 mmol/L   Chloride 103 98 - 111 mmol/L   CO2 27 22 - 32 mmol/L   Glucose, Bld 193 (H) 70 - 99 mg/dL    Comment: Glucose reference range applies only to samples taken after fasting for at least 8 hours.   BUN 19 6 - 20 mg/dL   Creatinine, Ser 8.18 0.61 - 1.24 mg/dL   Calcium 9.1 8.9 - 29.9 mg/dL   GFR, Estimated >37 >16 mL/min    Comment: (NOTE) Calculated using the CKD-EPI Creatinine Equation (2021)    Anion gap 6 5 - 15    Comment: Performed at [facility], [address], [place], [place] [address]  Glucose, capillary     Status: Abnormal   Collection Time: 09/26/20  2:41 PM  Result Value Ref Range   Glucose-Capillary 251 (H) 70 - 99 mg/dL    Comment: Glucose reference range applies only to samples taken after fasting for at least 8 hours.  Glucose, capillary     Status: Abnormal   Collection Time: 09/26/20  5:12 PM  Result Value Ref Range   Glucose-Capillary 317 (H) 70 - 99 mg/dL    Comment:  Glucose reference range applies only to samples taken after fasting for at least 8 hours.  Hepatic function panel     Status: None   Collection Time: 09/26/20  6:28 PM  Result Value Ref Range   Total Protein 7.5 6.5 - 8.1 g/dL   Albumin 4.0 3.5 - 5.0 g/dL   AST 17 15 - 41 U/L   ALT 39 0 - 44 U/L   Alkaline Phosphatase 46 38 - 126 U/L   Total Bilirubin 0.4 0.3 - 1.2 mg/dL   Bilirubin, Direct <4.8 0.0 - 0.2 mg/dL   Indirect Bilirubin NOT CALCULATED 0.3 - 0.9 mg/dL    Comment: Performed at [facility], [address], [place], [place] [address]  Hepatitis panel, acute     Status: Abnormal   Collection Time: 09/26/20  6:28 PM  Result Value Ref Range   Hepatitis B Surface Ag NON REACTIVE NON REACTIVE   HCV Ab Reactive (A) NON REACTIVE    Comment: (NOTE) The CDC recommends that a Reactive  HCV antibody result be followed up  with a HCV Nucleic Acid Amplification test.     Hep A IgM NON REACTIVE NON REACTIVE   Hep B C IgM NON REACTIVE NON REACTIVE    Comment: Performed at [facility], [address] [place], [place] [address]  Glucose, random     Status: Abnormal   Collection Time: 09/26/20  6:28 PM  Result Value Ref Range   Glucose, Bld 208 (H) 70 - 99 mg/dL    Comment: Glucose reference range applies only to samples taken after fasting for at least 8 hours. Performed at [facility], [address] [place], [place] [address]   Glucose, capillary     Status: Abnormal   Collection Time: 09/26/20  9:34 PM  Result Value Ref Range   Glucose-Capillary 228 (H) 70 - 99 mg/dL    Comment: Glucose reference range applies only to samples taken after fasting for at least 8 hours.  Glucose, capillary     Status: Abnormal   Collection Time: 09/27/20  6:38 AM  Result Value Ref Range   Glucose-Capillary 193 (H) 70 - 99 mg/dL    Comment: Glucose reference range applies only to samples taken after fasting for at least 8 hours.  Glucose, capillary     Status: Abnormal   Collection Time: 09/27/20 11:46 AM  Result Value Ref Range   Glucose-Capillary 154 (H) 70 - 99 mg/dL    Comment: Glucose reference range applies only to samples taken after fasting for at least 8 hours.    Blood Alcohol level:  Lab Results  Component Value Date   ETH <10 09/23/2020   ETH <10 05/10/2019    Metabolic Disorder Labs: Lab Results  Component Value Date   HGBA1C 6.8 (H) 09/26/2020   MPG 148.46 09/26/2020   MPG 151.33 08/17/2020   Lab Results  Component Value Date   PROLACTIN 31.3 (H) 05/23/2015   Lab Results  Component Value Date   CHOL 137 09/25/2020   TRIG 61 09/25/2020   HDL 32 (L) 09/25/2020   CHOLHDL 4.3 09/25/2020   VLDL 12 09/25/2020   LDLCALC 93 09/25/2020   LDLCALC 87 05/23/2015    Physical Findings: CIWA:  CIWA-Ar Total: 2 COWS:  COWS Total  Score: 1  Musculoskeletal: Strength & Muscle Tone: normal Gait & Station:  unable to assess, patient was resting in bed Patient leans: N/A  Psychiatric Specialty Exam:  Presentation  General  Appearance: casually dressed, fair hygiene  Eye Contact:Good  Speech:Clear and Coherent; Normal Rate  Speech Volume:Normal  Handedness:Right   Mood and Affect  Mood:Dysphoric  Affect:Congruent; Appropriate   Thought Process  Thought Processes:Coherent; Goal Directed  Descriptions of Associations:superficially linear and goal directed  Orientation:Full (Time, Place and Person)  Thought Content:Logical - denies AVH, paranoia, or delusions  History of Schizophrenia/Schizoaffective disorder:No  Duration of Psychotic Symptoms:No data recorded Hallucinations:Hallucinations: None  Ideas of Reference:None  Suicidal Thoughts:Suicidal Thoughts: No  Homicidal Thoughts:Homicidal Thoughts: No   Sensorium  Memory:Immediate Fair; Recent Fair  Judgment:Poor  Insight:Poor   Executive Functions  Concentration:Fair  Attention Span:Fair  Recall:Fair  Fund of Knowledge:Fair  Language:Fair   Psychomotor Activity  Psychomotor Activity:Psychomotor Activity: Normal   Assets  Assets:Communication Skills; Desire for Improvement; Resilience   Sleep  Sleep:Sleep: Fair Number of Hours of Sleep: 5.25    Physical Exam: Physical Exam Vitals and nursing note reviewed.  Constitutional:      General: He is not in acute distress.    Appearance: Normal appearance. He is normal weight. He is not ill-appearing or toxic-appearing.  HENT:     Head: Normocephalic and atraumatic.  Pulmonary:     Effort: Pulmonary effort is normal.  Musculoskeletal:        General: Normal range of motion.  Neurological:     General: No focal deficit present.     Mental Status: He is alert.   Review of Systems  Respiratory:  Negative for cough and shortness of breath.   Cardiovascular:   Negative for chest pain.  Gastrointestinal:  Negative for abdominal pain, constipation, diarrhea, nausea and vomiting.  Neurological:  Negative for weakness and headaches.  Blood pressure 116/81, pulse 67, temperature (!) 97.5 F (36.4 C), temperature source Oral, resp. rate 16, height 5\' 3"  (1.6 m), weight 70.3 kg, SpO2 100 %. Body mass index is 27.45 kg/m.   Treatment Plan Summary: Daily contact with patient to assess and evaluate symptoms and progress in treatment and Medication management   [name] is a 45 yo male w/ extensive psychiatric hx of polysubstance abuse, substance induced mood disorder, attempted suicide with heroin, multiple ED visits for substance abuse, and multiple psychiatric hospitalization presents unaccompanied to [facility] as a walk-in due to SI, HI, and substance rehabilitation.   Currently he is doing well on his medications.  He reports that his depression is under control.  He reports that his withdrawal symptoms are also improving his most recent cow score was 2.  Would not recommend any medication changes at this time.  Plan is still to discharge him back to [facility] with his ultimate goal of being excepted to the bats program.  We will continue to monitor.   MDD, Recurrent, Severe w/o psychosis: -Zoloft 50 mg daily for depression    Opiate and Stimulant use d/o - amphetamine type: -Continue Bentyl 20 mg q6 PRN -Continue Robaxin 500 mg q8 PRN -Continue Naproxen 500 mg BID PRN -Continue Zofran-ODT 4 mg q6 PRN -Continue Imodium 2mg  PRN -Continue Gabapentin to 300 mg TID -Continue scheduled Clonidine taper -COWS: most recent 2    Nicotine Dependence: -Continue Nicotine patch 21 mg daily. Remove patch at night.     Insomnia: Continue trazodone 100 mg nightly as needed.   Type 2 Diabetes: -Consulted Diabetes Coordinator, appreciate recs -Continue Metformin 500 mg bid -Continue Sliding Scale - A1c 6.8   Elevated ALT:- resolved/ Hep C Ab  positive: -Hepatitis panel positive for Hep C Ab and patient  will need outpatient f/u for Hepatitis after discharge; hepatic function panel WNL on 09/26/20 (AST 17 and ALT 39)   Lauro Franklin, MD 09/27/2020, 3:10 PM

## 2020-09-27 NOTE — Group Note (Signed)
LCSW Group Therapy Note  09/27/2020  10-11am  Type of Therapy and Topic:  Group Therapy: "My Mental Health"  Participation Level:  Did Not Attend   Description of Group:   In this group, patients were asked four questions in order to generate discussion around the idea of mental illness and/or substance addiction being medical problems: In one sentence describe the current state of your mental health or substance use. How much do you feel similar to or different from others? Do you tend to identify with other people or compare yourself to them?  In a word or sentence, share what you desire your mental health or substance use to be moving forward.  Discussion was held that led to the conclusion that comparing ourselves to others is not healthy, but identifying with the elements of their issues that are similar to ours is helpful.    Therapeutic Goals:  Patients will identify their feelings about their current mental health/substance use problems. Patients will describe how they feel similar to or different from others, and whether they tend to identify with or compare themselves to other people with the same issues. Patients will explore the differences in these concepts and how a change of mindset about mental health/substance use can help with reaching recovery goals. Patients will think about and share what their recovery goals are, in terms of mental health and/or substance use.  Summary of Patient Progress:  The patient was invited to group, did not attend  Therapeutic Modalities:   Processing Psychoeducation  Yasin Ducat J Grossman-Orr, MSW, LCSW 

## 2020-09-27 NOTE — Progress Notes (Signed)
Adult Psychoeducational Group Note  Date:  09/27/2020 Time:  9:13 PM  Group Topic/Focus:  Wrap-Up Group:   The focus of this group is to help patients review their daily goal of treatment and discuss progress on daily workbooks.  Participation Level:  Active  Participation Quality:  Appropriate  Affect:  Appropriate  Cognitive:  Appropriate  Insight: Appropriate  Engagement in Group:  Developing/Improving  Modes of Intervention:  Discussion  Additional Comments:  Pt stated his goal for today was to focus on his treatment plan. Pt stated he accomplished his goal today. Pt stated he talked with his doctor and social worker about his care today. Pt rated his overall day a 5 out of 10. Pt stated he made no calls today. Pt stated he was able to attend all meals. Pt stated he took all medications provided today. Pt stated he attend all groups held today. Pt stated his appetite was pretty good today. Pt rated sleep last night was poor. Pt stated the goal tonight was to get some rest. Pt stated he had no physical pain today. Pt deny visual hallucinations and auditory issues tonight. Pt denies thoughts of harming himself or others. Pt stated he would alert staff if anything changed.  Felipa Furnace 09/27/2020, 9:13 PM

## 2020-09-27 NOTE — Progress Notes (Addendum)
   09/26/20 2015  Psych Admission Type (Psych Patients Only)  Admission Status Voluntary  Psychosocial Assessment  Patient Complaints Irritability;Other (Comment) (constipation)  Eye Contact Fair  Facial Expression Animated  Affect Anxious;Irritable  Speech Logical/coherent  Interaction Assertive  Motor Activity Slow  Appearance/Hygiene Unremarkable  Behavior Characteristics Anxious;Irritable  Mood Anxious;Irritable;Preoccupied  Thought Process  Coherency Circumstantial  Content Blaming others;Preoccupation (pt irritated because he can't get suboxone here)  Delusions None reported or observed  Perception WDL  Hallucination None reported or observed  Judgment Poor  Confusion None  Danger to Self  Current suicidal ideation? Denies  Danger to Others  Danger to Others None reported or observed   Pt denies SI, HI, AVH and pain. Pt irritable because he believed he would get suboxone here. "That doctor had me on the hook for a couple days cause she told me I could get suboxone. Now all my plans are ruined because of that. I had a plan and now it's all messed up. I need to be on suboxone before I can go to this rehab program." Pt has limited insight into his situation. Pt also ruminating on his constipation. Pt offered MOM since it is on his med list. "I want Dulcolax. You got any of that back there you can just give me?" Pt educated on need to start with what is offered and then the provider will be notified if something else is needed. "My stomach is jacked up because I'm eating food. When I was out there using dope I didn't have an appetite. Now, I'm eating food and it's messing me up."

## 2020-09-27 NOTE — Progress Notes (Addendum)
   09/27/20 1600  Psych Admission Type (Psych Patients Only)  Admission Status Voluntary  Psychosocial Assessment  Patient Complaints Anxiety  Eye Contact Fair  Facial Expression Animated  Affect Anxious  Speech Logical/coherent  Interaction Assertive  Motor Activity Slow  Appearance/Hygiene Unremarkable  Behavior Characteristics Cooperative;Anxious  Mood Anxious  Aggressive Behavior  Targets  (no aggression)  Effect No apparent injury  Thought Process  Coherency Circumstantial  Content Preoccupation  Delusions None reported or observed  Perception WDL  Hallucination None reported or observed  Judgment Poor  Confusion None  Danger to Self  Current suicidal ideation? Denies  Self-Injurious Behavior No self-injurious ideation or behavior indicators observed or expressed   Agreement Not to Harm Self Yes  Description of Agreement contracts for safety verbally  Danger to Others  Danger to Others None reported or observed   Pt has been calm and cooperative- less irritable- on the unit today- observed in the milieu interacting appropriately with peers and staff. Pt denies withdrawal symptoms, SI/HI and A/VH. Pt complained of feeling constipated, and was given prn stool softener, and encouraged to drink fluids. VS stable. Pt remains safe on the unit with Q 15 min checks

## 2020-09-28 DIAGNOSIS — R45850 Homicidal ideations: Secondary | ICD-10-CM | POA: Diagnosis not present

## 2020-09-28 DIAGNOSIS — R45851 Suicidal ideations: Secondary | ICD-10-CM | POA: Diagnosis not present

## 2020-09-28 DIAGNOSIS — F1994 Other psychoactive substance use, unspecified with psychoactive substance-induced mood disorder: Secondary | ICD-10-CM | POA: Diagnosis not present

## 2020-09-28 LAB — GLUCOSE, CAPILLARY
Glucose-Capillary: 216 mg/dL — ABNORMAL HIGH (ref 70–99)
Glucose-Capillary: 207 mg/dL — ABNORMAL HIGH (ref 70–99)
Glucose-Capillary: 164 mg/dL — ABNORMAL HIGH (ref 70–99)
Glucose-Capillary: 214 mg/dL — ABNORMAL HIGH (ref 70–99)

## 2020-09-28 MED ORDER — TRAZODONE HCL 50 MG PO TABS
50.0000 mg | ORAL_TABLET | Freq: Once | ORAL | Status: AC
  Administered 2020-09-28: 50 mg via ORAL
  Filled 2020-09-28 (×2): qty 1

## 2020-09-28 NOTE — Progress Notes (Signed)
   09/27/20 2200  Psych Admission Type (Psych Patients Only)  Admission Status Voluntary  Psychosocial Assessment  Eye Contact Fair  Facial Expression Animated  Affect Anxious  Speech Logical/coherent  Interaction Assertive  Motor Activity Slow  Appearance/Hygiene Unremarkable  Aggressive Behavior  Targets Property (no aggression)  Effect No apparent injury  Thought Process  Coherency Circumstantial  Content Preoccupation  Delusions None reported or observed  Perception WDL  Hallucination None reported or observed  Judgment Poor  Confusion None  Danger to Self  Current suicidal ideation? Denies  Self-Injurious Behavior No self-injurious ideation or behavior indicators observed or expressed   Agreement Not to Harm Self Yes  Description of Agreement contracts for safety verbally  Danger to Others  Danger to Others None reported or observed    09/27/20 2200  Psych Admission Type (Psych Patients Only)  Admission Status Voluntary  Psychosocial Assessment  Eye Contact Fair  Facial Expression Animated  Affect Anxious  Speech Logical/coherent  Interaction Assertive  Motor Activity Slow  Appearance/Hygiene Unremarkable  Aggressive Behavior  Targets Property (no aggression)  Effect No apparent injury  Thought Process  Coherency Circumstantial  Content Preoccupation  Delusions None reported or observed  Perception WDL  Hallucination None reported or observed  Judgment Poor  Confusion None  Danger to Self  Current suicidal ideation? Denies  Self-Injurious Behavior No self-injurious ideation or behavior indicators observed or expressed   Agreement Not to Harm Self Yes  Description of Agreement contracts for safety verbally  Danger to Others  Danger to Others None reported or observed

## 2020-09-28 NOTE — [facility] Group Notes (Signed)
Adult Psychoeducational Group Note  Date:  09/28/2020 Time:  10:17 AM  Group Topic/Focus:  Goals Group:   The focus of this group is to help patients establish daily goals to achieve during treatment and discuss how the patient can incorporate goal setting into their daily lives to aide in recovery.  Participation Level:  Did Not Attend  Participation Quality:   Did not attend  Affect:   Did not attend  Cognitive:   Did not attend  Insight: None  Engagement in Group:   Did not attend  Modes of Intervention:   Did not attend  Additional Comments:  Pt did not attend due to being asleep.  Sivan Quast, Sharen Counter 09/28/2020, 10:17 AM

## 2020-09-28 NOTE — Progress Notes (Addendum)
[facility] MD Progress Note  09/28/2020 11:48 AM Joshua Thomas  MRN:  [id number] Subjective:   Joshua Thomas is a 45 yo male w/ extensive psychiatric hx of polysubstance abuse, substance induced mood disorder, attempted suicide with heroin, multiple ED visits for substance abuse, and multiple psychiatric hospitalization presents unaccompanied to [facility] as a walk-in due to SI, HI, and substance rehabilitation.   Case was discussed in the multidisciplinary team. MAR was reviewed and patient was compliant with medications.  He reports that he is doing well today.  He reports that he slept okay last night.  He reports his appetite is doing good.  He reports no SI, HI, or AVH.  He reports that he was able to have a bowel movement this morning.  Discussed with him that we will attempt to find placement at a rehab center tomorrow, however, if beds not available we will have to look at alternative placement either in living with his mother or shelter for him.  He reports no other issues at present.   Principal Problem: <principal problem not specified> Diagnosis: Active Problems:   Polysubstance abuse (HCC)   Opioid use disorder, moderate, dependence (HCC)   Chronic low back pain   MDD (major depressive disorder), recurrent severe, without psychosis (HCC)   Suicidal ideation   Opioid abuse (HCC)   Amphetamine abuse (HCC)   MDD (major depressive disorder)  Total Time spent with patient:  I personally spent 25 minutes on the unit in direct patient care. The direct patient care time included face-to-face time with the patient, reviewing the patient's chart, communicating with other professionals, and coordinating care. Greater than 50% of this time was spent in counseling or coordinating care with the patient regarding goals of hospitalization, psycho-education, and discharge planning needs.   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   COPD (chronic obstructive pulmonary disease) (HCC)    DDD (degenerative disc disease), lumbar    Degenerative disc disease, lumbar    Depression    Discitis    GERD (gastroesophageal reflux disease)    Hepatitis C    Hepatitis C    History of stomach ulcers    Hx of diabetes mellitus    due to infection   Hypertension    Lung nodules    3 on L and 2 on R   PTSD (post-traumatic stress disorder)    Sciatica    Substance abuse (HCC)     Past Surgical History:  Procedure Laterality Date   FRACTURE SURGERY     HARDWARE REMOVAL  01/27/2012   Procedure: HARDWARE REMOVAL;  Surgeon: Kathryne Hitch, MD;  Location: [facility];  Service: Orthopedics;  Laterality: Left;   IR LUMBAR DISC ASPIRATION W/IMG GUIDE  06/08/2017   RADIOLOGY WITH ANESTHESIA N/A 06/08/2017   Procedure: DISC ASPIRATION;  Surgeon: Julieanne Cotton, MD;  Location: [facility] OR;  Service: Radiology;  Laterality: N/A;   TIBIA IM NAIL INSERTION  01/27/2012   Procedure: INTRAMEDULLARY (IM) NAIL TIBIAL;  Surgeon: Kathryne Hitch, MD;  Location: [facility];  Service: Orthopedics;  Laterality: Left;  Removal of  IM Rod and Screws Left Tibia with Exchange Nail, Allograft Bone Graft Left Tibia   Family History:  Family History  Problem Relation Age of Onset   Diabetes Mother    Alcohol abuse Father    Cirrhosis Father    Aneurysm Father    Family Psychiatric  History: None Social History:  Social History   Substance and Sexual Activity  Alcohol Use Yes   Alcohol/week: 1.0 standard drink   Types: 1 Standard drinks or equivalent per week   Comment: occasionally     Social History    Substance and Sexual Activity  Drug Use Yes   Frequency: 7.0 times per week   Types: IV, Methamphetamines, Heroin   Comment: heroin daily, meth daily    Social History   Socioeconomic History   Marital status: Single    Spouse name: Not on file   Number of children: Not on file   Years of education: 12   Highest education level: High school graduate  Occupational History   Not on file  Tobacco Use   Smoking status: Every Day    Packs/day: 1.00    Years: 33.00    Pack years: 33.00    Types: Cigarettes   Smokeless tobacco: Former    Quit date: 08/08/2006   Tobacco comments:    1 PPD  Substance and Sexual Activity   Alcohol use: Yes    Alcohol/week: 1.0 standard drink    Types: 1 Standard drinks or equivalent per week    Comment: occasionally   Drug use: Yes    Frequency: 7.0 times per week    Types: IV, Methamphetamines, Heroin    Comment: heroin daily, meth daily   Sexual activity: Not Currently  Other Topics Concern   Not on file  Social History Narrative   Not on file   Social Determinants of Health   Financial Resource Strain: Not on file  Food Insecurity: Not on file  Transportation Needs: Not on file  Physical Activity: Not on file  Stress: Not on file  Social Connections: Not on file   Additional Social History:                         Sleep: Fair  Appetite:  Good  Current Medications: Current Facility-Administered Medications  Medication Dose Route Frequency Provider Last Rate Last Admin   acetaminophen (TYLENOL) tablet 650 mg  650 mg Oral Q6H PRN Novella Olive, NP       alum & mag hydroxide-simeth (MAALOX/MYLANTA) 200-200-20 MG/5ML suspension 30 mL  30 mL Oral Q4H PRN Novella Olive, NP   30 mL at 09/25/20 2121   bisacodyl (DULCOLAX) EC tablet 5 mg  5 mg Oral Daily PRN Bobbitt, Shalon E, NP   5 mg at 09/27/20 0900   [START ON 09/29/2020] cloNIDine (CATAPRES) tablet 0.1 mg  0.1 mg Oral QAC breakfast Novella Olive, NP        dicyclomine (BENTYL) tablet 20 mg  20 mg Oral Q6H PRN Novella Olive, NP       gabapentin (NEURONTIN) capsule 300 mg  300 mg Oral TID Novella Olive, NP   300 mg at 09/28/20 0837   hydrOXYzine (ATARAX/VISTARIL) tablet 25 mg  25 mg Oral Q6H PRN Novella Olive, NP   25 mg at 09/25/20 0940   insulin aspart (novoLOG) injection 0-9 Units  0-9 Units Subcutaneous TID WC Park Pope, MD   3 Units at 09/28/20 [phone]  loperamide (IMODIUM) capsule 2-4 mg  2-4 mg Oral PRN Novella Olive, NP       magnesium hydroxide (MILK OF MAGNESIA) suspension 30 mL  30 mL Oral Daily PRN Novella Olive, NP   30 mL at 09/26/20 2013   metFORMIN (GLUCOPHAGE-XR) 24 hr tablet 500 mg  500 mg Oral BID WC Park Pope, MD   500 mg at 09/28/20 0837   methocarbamol (ROBAXIN) tablet 500 mg  500 mg Oral Q8H PRN Novella Olive, NP   500 mg at 09/28/20 0839   naproxen (NAPROSYN) tablet 500 mg  500 mg Oral BID PRN Novella Olive, NP   500 mg at 09/27/20 0900   nicotine (NICODERM CQ - dosed in mg/24 hours) patch 21 mg  21 mg Transdermal Q0600 Novella Olive, NP   21 mg at 09/28/20 0838   ondansetron (ZOFRAN-ODT) disintegrating tablet 4 mg  4 mg Oral Q6H PRN Novella Olive, NP       polyethylene glycol (MIRALAX / GLYCOLAX) packet 17 g  17 g Oral Daily Lauro Franklin, MD   17 g at 09/28/20 1000   sertraline (ZOLOFT) tablet 50 mg  50 mg Oral Seabron Spates, MD   50 mg at 09/27/20 2201   traZODone (DESYREL) tablet 50 mg  50 mg Oral QHS PRN Novella Olive, NP   50 mg at 09/27/20 2200    Lab Results:  Results for orders placed or performed during the hospital encounter of 09/24/20 (from the past 48 hour(s))  Glucose, capillary     Status: Abnormal   Collection Time: 09/26/20  2:41 PM  Result Value Ref Range   Glucose-Capillary 251 (H) 70 - 99 mg/dL    Comment: Glucose reference range applies only to samples taken after fasting for at least 8 hours.  Glucose, capillary     Status: Abnormal   Collection Time: 09/26/20  5:12 PM  Result  Value Ref Range   Glucose-Capillary 317 (H) 70 - 99 mg/dL    Comment: Glucose reference range applies only to samples taken after fasting for at least 8 hours.  Hepatic function panel     Status: None   Collection Time: 09/26/20  6:28 PM  Result Value Ref Range   Total Protein 7.5 6.5 - 8.1 g/dL   Albumin 4.0 3.5 - 5.0 g/dL   AST 17 15 - 41 U/L   ALT 39 0 - 44 U/L   Alkaline Phosphatase 46 38 - 126 U/L   Total Bilirubin 0.4 0.3 - 1.2 mg/dL   Bilirubin, Direct <1.6 0.0 - 0.2 mg/dL   Indirect Bilirubin NOT CALCULATED 0.3 - 0.9 mg/dL    Comment: Performed at [facility], [address], [place], [place] [address]  Hepatitis panel, acute     Status: Abnormal   Collection Time: 09/26/20  6:28 PM  Result Value Ref Range   Hepatitis B Surface Ag NON REACTIVE NON REACTIVE   HCV Ab Reactive (A) NON REACTIVE    Comment: (NOTE) The CDC recommends that a Reactive HCV antibody result be followed up  with a HCV Nucleic Acid Amplification test.     Hep A IgM NON REACTIVE NON REACTIVE   Hep B C IgM NON REACTIVE NON REACTIVE    Comment: Performed at [facility], [address], [place], [place] [address]  Glucose, random     Status: Abnormal   Collection Time: 09/26/20  6:28 PM  Result Value Ref  Range   Glucose, Bld 208 (H) 70 - 99 mg/dL    Comment: Glucose reference range applies only to samples taken after fasting for at least 8 hours. Performed at [facility], [address] [place], [place] [address]   Glucose, capillary     Status: Abnormal   Collection Time: 09/26/20  9:34 PM  Result Value Ref Range   Glucose-Capillary 228 (H) 70 - 99 mg/dL    Comment: Glucose reference range applies only to samples taken after fasting for at least 8 hours.  Glucose, capillary     Status: Abnormal   Collection Time: 09/27/20  6:38 AM  Result Value Ref Range   Glucose-Capillary 193 (H) 70 - 99 mg/dL    Comment: Glucose reference range applies only to  samples taken after fasting for at least 8 hours.  Glucose, capillary     Status: Abnormal   Collection Time: 09/27/20 11:46 AM  Result Value Ref Range   Glucose-Capillary 154 (H) 70 - 99 mg/dL    Comment: Glucose reference range applies only to samples taken after fasting for at least 8 hours.  Glucose, capillary     Status: Abnormal   Collection Time: 09/27/20  4:50 PM  Result Value Ref Range   Glucose-Capillary 249 (H) 70 - 99 mg/dL    Comment: Glucose reference range applies only to samples taken after fasting for at least 8 hours.  Glucose, capillary     Status: Abnormal   Collection Time: 09/27/20  9:21 PM  Result Value Ref Range   Glucose-Capillary 172 (H) 70 - 99 mg/dL    Comment: Glucose reference range applies only to samples taken after fasting for at least 8 hours.   Comment 1 Notify RN   Glucose, capillary     Status: Abnormal   Collection Time: 09/28/20  5:26 AM  Result Value Ref Range   Glucose-Capillary 216 (H) 70 - 99 mg/dL    Comment: Glucose reference range applies only to samples taken after fasting for at least 8 hours.   Comment 1 Notify RN     Blood Alcohol level:  Lab Results  Component Value Date   ETH <10 09/23/2020   ETH <10 05/10/2019    Metabolic Disorder Labs: Lab Results  Component Value Date   HGBA1C 6.8 (H) 09/26/2020   MPG 148.46 09/26/2020   MPG 151.33 08/17/2020   Lab Results  Component Value Date   PROLACTIN 31.3 (H) 05/23/2015   Lab Results  Component Value Date   CHOL 137 09/25/2020   TRIG 61 09/25/2020   HDL 32 (L) 09/25/2020   CHOLHDL 4.3 09/25/2020   VLDL 12 09/25/2020   LDLCALC 93 09/25/2020   LDLCALC 87 05/23/2015    Physical Findings: CIWA:  CIWA-Ar Total: 1 COWS:  COWS Total Score: 2  Musculoskeletal: Strength & Muscle Tone: normal Gait & Station:  steady, normal Patient leans: N/A  Psychiatric Specialty Exam:  Presentation  General Appearance: casually dressed, fair hygiene  Eye  Contact:Good  Speech:Clear and Coherent; Normal Rate  Speech Volume:Normal  Handedness:Right   Mood and Affect  Mood:described as "good" - appears more euthymic  Affect:Appropriate; Congruent   Thought Process  Thought Processes:Coherent; Goal Directed  Descriptions of Associations:superficially linear and goal directed  Orientation:Full (Time, Place and Person)  Thought Content:Logical - denies AVH, paranoia, or delusions  History of Schizophrenia/Schizoaffective disorder:No  Duration of Psychotic Symptoms:No data recorded Hallucinations:Hallucinations: None  Ideas of Reference:None  Suicidal Thoughts:Suicidal Thoughts: No  Homicidal  Thoughts:Homicidal Thoughts: No   Sensorium  Memory:Immediate Fair; Recent Fair  Judgment:Fair  Insight:Lacking   Executive Functions  Concentration:Good  Attention Span:Good  Recall:Good  Fund of Knowledge:Good  Language:Good   Psychomotor Activity  Psychomotor Activity:Psychomotor Activity: Normal   Assets  Assets:Communication Skills; Desire for Improvement; Resilience   Sleep  Sleep:Sleep: Fair Number of Hours of Sleep: 4.25    Physical Exam: Physical Exam Vitals and nursing note reviewed.  Constitutional:      General: He is not in acute distress.    Appearance: Normal appearance. He is normal weight. He is not ill-appearing or toxic-appearing.  HENT:     Head: Normocephalic and atraumatic.  Pulmonary:     Effort: Pulmonary effort is normal.  Musculoskeletal:        General: Normal range of motion.  Neurological:     General: No focal deficit present.     Mental Status: He is alert.   Review of Systems  Respiratory:  Negative for cough and shortness of breath.   Cardiovascular:  Negative for chest pain.  Gastrointestinal:  Negative for abdominal pain, constipation (resolved), diarrhea, nausea and vomiting.  Neurological:  Negative for weakness and headaches.  Psychiatric/Behavioral:   Negative for depression, hallucinations and suicidal ideas. The patient is nervous/anxious (mild).   Blood pressure (!) 118/103, pulse 70, temperature 97.8 F (36.6 C), temperature source Oral, resp. rate 16, height 5\' 3"  (1.6 m), weight 70.3 kg, SpO2 100 %. Body mass index is 27.45 kg/m.   Treatment Plan Summary: Daily contact with patient to assess and evaluate symptoms and progress in treatment and Medication management   Mr. Conery is a 44 yo male w/ extensive psychiatric hx of polysubstance abuse, substance induced mood disorder, attempted suicide with heroin, multiple ED visits for substance abuse, and multiple psychiatric hospitalization presents unaccompanied to [facility] as a walk-in due to SI, HI, and substance rehabilitation.   He was able to have a BM but will continue with Miralax.  He is stable from a psychiatric standpoint.  Will attempt to get him placed at a Rehab center tomorrow but if no bed is available will plan for discharge.  Will not make any medication changes at this time.  We will continue to monitor.    MDD, Recurrent, Severe w/o psychosis (r/o substance induced depressive d/o) -Continue Zoloft 50 mg daily     Opiate Use d/o and Stimulant use d/o - amphetamine type: -Continue Bentyl 20 mg q6 PRN -Continue Robaxin 500 mg q8 PRN -Continue Naproxen 500 mg BID PRN -Continue Zofran-ODT 4 mg q6 PRN -Continue Imodium 2mg  PRN -Continue Gabapentin to 300 mg TID -Continue scheduled Clonidine taper -COWS: most recent 2    Nicotine Dependence: -Continue Nicotine patch 21 mg daily. Remove patch at night.    Constipation: -Had a BM this morning -Continue Miralax 17 g daily    Insomnia: -Continue trazodone 100 mg nightly as needed.   Type 2 Diabetes: -Consulted Diabetes Coordinator, appreciate recs -Continue Metformin 500 mg bid -Continue Sliding Scale Insulin - A1c 6.8 - Needs outpatient PCP f/u after discharge   Elevated ALT (Resolved):Hep C Ab  positive: -Hepatitis panel positive for Hep C Ab and patient will need outpatient f/u for Hepatitis after discharge; hepatic function panel WNL on 09/26/20 (AST 17 and ALT 39)   , MD 09/28/2020, 11:48 AM

## 2020-09-28 NOTE — [facility] Group Notes (Signed)
Pt did not attend relaxation group.  

## 2020-09-28 NOTE — Progress Notes (Addendum)
   09/28/20 1300  Psych Admission Type (Psych Patients Only)  Admission Status Voluntary  Psychosocial Assessment  Patient Complaints Anxiety  Eye Contact Fair  Facial Expression Animated  Affect Anxious  Speech Logical/coherent  Interaction Assertive  Motor Activity Slow  Appearance/Hygiene Unremarkable  Behavior Characteristics Cooperative;Anxious  Mood Anxious;Pleasant  Aggressive Behavior  Targets Property (no aggression)  Type of Behavior Striking out  Effect No apparent injury  Thought Process  Coherency Circumstantial  Content Preoccupation  Delusions None reported or observed  Perception WDL  Hallucination None reported or observed  Judgment Poor  Confusion None  Danger to Self  Current suicidal ideation? Denies  Self-Injurious Behavior No self-injurious ideation or behavior indicators observed or expressed   Agreement Not to Harm Self Yes  Description of Agreement contracts for safety verbally  Danger to Others  Danger to Others None reported or observed    D. Pt presents with an anxious affect/ mood- calm, cooperative behavior- observed on the unit interacting appropriately with peers and staff, but has not attended groups today. Pt currently denies withdrawal symptoms,  SI/HI and AVH   A. Labs and vitals monitored. Pt given and educated on medications. Pt supported emotionally and encouraged to express concerns and ask questions.   R. Pt remains safe with 15 minute checks. Will continue POC.

## 2020-09-28 NOTE — [facility] Suicide Risk Assessment (Signed)
[facility] INPATIENT:  Family/Significant Other Suicide Prevention Education  Suicide Prevention Education:  Education Completed; Larene Beach,  mother, [phone]), (name of family member/significant other) has been identified by the patient as the family member/significant other with whom the patient will be residing, and identified as the person(s) who will aid the patient in the event of a mental health crisis (suicidal ideations/suicide attempt).  With written consent from the patient, the family member/significant other has been provided the following suicide prevention education, prior to the and/or following the discharge of the patient.  Mother reports that he continues to have mental health and substance use issues that she is unable to assist with at this time. Mother reports that he needs additional treatment and stressed the importance that patient needs a bed to bed discharge or he would not follow up with additional treatment. Mother reports that patient is not like he used to be and that he often times won't get out of bed or engage at all. Mother reports that patient has very little insight.  To mother knowledge patient does not have access to guns/weapons but feels like his access to drugs will kill him.   The suicide prevention education provided includes the following: Suicide risk factors Suicide prevention and interventions National Suicide Hotline telephone number [facility] assessment telephone number [facility] Emergency Assistance 911 [facility] and/or Residential Mobile Crisis Unit telephone number  Request made of family/significant other to: Remove weapons (e.g., guns, rifles, knives), all items previously/currently identified as safety concern.   Remove drugs/medications (over-the-counter, prescriptions, illicit drugs), all items previously/currently identified as a safety concern.  The family member/significant other verbalizes understanding of  the suicide prevention education information provided.  The family member/significant other agrees to remove the items of safety concern listed above.  Brittanya Winburn E Renad Jenniges 09/28/2020, 2:08 PM

## 2020-09-28 NOTE — Progress Notes (Signed)
[facility] Group Notes:  (Nursing/MHT/Case Management/Adjunct)  Date:  09/28/2020  Time:  2000  Type of Therapy:   wrap up group  Participation Level:  Active  Participation Quality:  Appropriate, Attentive, Sharing, and Supportive  Affect:  Appropriate  Cognitive:  Alert  Insight:  Improving  Engagement in Group:  Engaged  Modes of Intervention:  Clarification, Education, and Support  Summary of Progress/Problems: Positive thinking and positive change were discussed.   Marcille Buffy 09/28/2020, 8:48 PM

## 2020-09-28 NOTE — [facility] Group Notes (Signed)
Pt did not attend Psycho-Ed group.

## 2020-09-28 NOTE — Group Note (Signed)
[facility] Group Notes: (Clinical Social Work)   09/28/2020      Type of Therapy:  Group Therapy   Participation Level:  Did Not Attend - was invited both individually by MHT and by overhead announcement, chose not to attend.   Ethel Veronica Grossman-Orr, LCSW 09/28/2020, 10:55 AM    

## 2020-09-29 LAB — GLUCOSE, CAPILLARY
Glucose-Capillary: 162 mg/dL — ABNORMAL HIGH (ref 70–99)
Glucose-Capillary: 204 mg/dL — ABNORMAL HIGH (ref 70–99)

## 2020-09-29 MED ORDER — NICOTINE 21 MG/24HR TD PT24: 21.0000 mg | MEDICATED_PATCH | Freq: Every day | TRANSDERMAL | 0 refills | Status: DC

## 2020-09-29 MED ORDER — GABAPENTIN 300 MG PO CAPS: 300.0000 mg | ORAL_CAPSULE | Freq: Three times a day (TID) | ORAL | 0 refills | Status: DC

## 2020-09-29 MED ORDER — SERTRALINE HCL 50 MG PO TABS: 50.0000 mg | ORAL_TABLET | Freq: Every day | ORAL | 0 refills | Status: DC

## 2020-09-29 MED ORDER — METFORMIN HCL ER 500 MG PO TB24: 500.0000 mg | ORAL_TABLET | Freq: Two times a day (BID) | ORAL | 0 refills | Status: DC

## 2020-09-29 MED ORDER — TRAZODONE HCL 50 MG PO TABS: 50.0000 mg | ORAL_TABLET | Freq: Every evening | ORAL | 0 refills | Status: DC | PRN

## 2020-09-29 NOTE — Progress Notes (Signed)
   09/28/20 2210  Psych Admission Type (Psych Patients Only)  Admission Status Voluntary  Psychosocial Assessment  Patient Complaints None  Eye Contact Fair  Facial Expression Animated  Affect Anxious  Speech Logical/coherent  Interaction Assertive  Motor Activity Slow  Appearance/Hygiene Unremarkable  Behavior Characteristics Cooperative  Mood Anxious;Pleasant  Aggressive Behavior  Targets Property (no aggression)  Type of Behavior Striking out  Effect No apparent injury  Thought Process  Coherency Circumstantial  Content Preoccupation  Delusions None reported or observed  Perception WDL  Hallucination None reported or observed  Judgment Poor  Confusion None  Danger to Self  Current suicidal ideation? Denies  Self-Injurious Behavior No self-injurious ideation or behavior indicators observed or expressed   Agreement Not to Harm Self Yes  Description of Agreement contracts for safety verbally  Danger to Others  Danger to Others None reported or observed

## 2020-09-29 NOTE — Group Note (Deleted)
Occupational Therapy Group Note  Group Topic:Feelings Management  Group Date: 09/29/2020 Start Time: 1400 End Time: 1450 Facilitators: Donne Hazel, OT/L    Group encouraged increased participation and engagement through discussion focused on STRENGTHS. Patients were encouraged to fill out a worksheet to structure discussion, identifying ten strengths that they currently have. Patients were then instructed to utilize various symbols to identify which strengths help them in their relationships, school/profession, and leisure participation. Discussion also focused on identifying one strength they do not currently have but would like. Discussion within the group followed with patients sharing their responses and highlighting their own personal strengths.   Therapeutic Goals: Identify strengths vs weaknesses Discuss and identify ways we can highlight our strengths      Participation Level: {OT [facility] Participation Level:26267}   Participation Quality: {OT [facility] Participation Quality:26268}   Behavior: {[facility] OT Group Behavior:26269}   Speech/Thought Process: {[facility] OT Speech/Thought Process:26270}   Affect/Mood: {OT [facility] Affect/Mood:26271}   Insight: {OT [facility] Insight:26272}   Judgement: {OT [facility] Judgement:26272}   Individualization: *** was *** in their participation of group discussion/activity. *** identified  Modes of Intervention: {[facility] MODES OF INTERVENTION:26273}  Patient Response to Interventions:  {[facility] OT Patient Response to Interventions:26274}   Plan: Continue to engage patient in OT groups 2 - 3x/week.  09/29/2020  Donne Hazel, OT/L

## 2020-09-29 NOTE — Progress Notes (Signed)
  [facility] Adult Case Management Discharge Plan :  Will you be returning to the same living situation after discharge:  No. Shelter  At discharge, do you have transportation home?: Yes,  Safe Transport  Do you have the ability to pay for your medications: Yes,  Community   Release of information consent forms completed and in the chart;  Patient's signature needed at discharge.  Patient to Follow up at:  Follow-up Information     [facility] Call.   Why: You may go to this provider for primary care services.  Please call to schedule an appointment. Contact information: [address] [place] [address] [phone]        [facility]. Go to.   Why: You may go to this provider for therapy and medication management services. Contact information: [address] [place] [address] [phone]         [facility] Call.   Specialty: Addiction Medicine Why: A referral has been made to this provider for residential substance use treatment services.  Please call daily to check on bed availability. Contact information: [address] [place] [address] [phone]         [facility]. Call.   Why: Please call this provider each day to check on an available bed. Contact information: Address: [address] [place], [place] [address]  Phone: [phone]                Next level of care provider has access to [facility] Link:no  Safety Planning and Suicide Prevention discussed: Yes,  Mother      Has patient been referred to the Quitline?: Patient refused referral  Patient has been referred for addiction treatment: Yes  Aram Beecham, LCSWA 09/29/2020, 10:09 AM

## 2020-09-29 NOTE — Plan of Care (Signed)
Nurse discussed coping skills with patient.  

## 2020-09-29 NOTE — Progress Notes (Signed)
D:  Patient denied SI and HI, contracts for safety.  Denied A/V hallucinations.  Denied pain. A:  Medications administered per MD orders.  Emotional support and encouragement given patient. R:  Safety maintained with 15 minute checks.  

## 2020-09-29 NOTE — Discharge Summary (Signed)
Physician Discharge Summary Note  Patient:  Joshua Thomas is an 45 y.o., male MRN:  [id number] DOB:  1975-12-28 Patient phone:  [phone] (home)  Patient address:   [address] [place] [address],  Total Time spent with patient: 30 minutes  Date of Admission:  09/24/2020 Date of Discharge: 09/29/2020  Reason for Admission:  (From MD's admission note): Patient is a 45 yo male w/ extensive psychiatric hx of polysubstance abuse, substance induced mood disorder, attempted suicide with heroin, multiple ED visits for substance abuse, and multiple psychiatric hospitalization presents unaccompanied to [facility] as a walk-in due to [place], [place], and substance rehabilitation.   CHART REVIEW Patient has a medical history significant for hepatitis C and osteomyelitis. Patient has not had substance rehabilitation for several years.    In the ED, patient lab was significant for normal COVID PCR, negative UDS, negative acetominophen, negative ethanol, normal CBC other than a low platelet of 130, and normal CMP except for a glucose of 199.    INTERVIEW (09/25/2020) Patient was seen and assessed with Dr. Lovette Cliche attending.  Patient states that he is at [facility] to get help with finding a substance use rehabilitation service.  Patient states that he "I am tired of using dope and getting close to ending my life multiple times".  Patient states that since he was discharged from [facility] in August he had gone to [facility] and was turned away because his blood glucose was too high.  Patient then states he walked back to [place].  Patient states that he had been clean from substances for a few days even after going to jail but then ended up using after an unspecified incident.  Patient was not willing to go into what the incident was but states that this triggered him to use fentanyl.  Patient states that since relapsing on substances he has used methamphetamine and fentanyl regularly.  Patient states that he is presently  still withdrawing from fentanyl but states he will be more active in his treatment tomorrow after he is less tired and further from his last use which he states was 2 days ago with fentanyl and 3 days ago with methamphetamine.  Patient was living with mom prior to returning to [facility].  Patient states that he uses his mom as a "crutch" in order to manage his substance use but he no longer wants to be so dependent on others while still using.  Patient states that he has not had a real job for several years.  Patient states he earns money via "other ways to get high".  Patient states that he is currently not suicidal, homicidal, or endorsing AVH.  Patient did state prior to admission that he had felt suicidal wanting to overdose and homicidal towards the dealer that gave him "bullshit dope".   Patient states that last hospitalization he was put on Zoloft and that worked really well for his depression as well as anxiety.  Patient is open to any form of treatment available so long as he is able to go to reach substance rehab from here.  Patient states that he was feeling tired and "that is about all I had to say.  Can I go back to my man cave?".  Patient presently still undergoing some mild withdrawal symptoms including chills, fatigue, mild tremor, and anxiety.   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   COPD (chronic obstructive  pulmonary disease) (HCC)    DDD (degenerative disc disease), lumbar    Degenerative disc disease, lumbar    Depression    Discitis    GERD (gastroesophageal reflux disease)    Hepatitis C    Hepatitis C    History of stomach ulcers    Hx of diabetes mellitus    due to infection   Hypertension    Lung nodules    3 on L and 2 on R   PTSD (post-traumatic stress disorder)    Sciatica    Substance abuse ([place])     Past Surgical History:  Procedure Laterality Date   FRACTURE SURGERY     HARDWARE REMOVAL  01/27/2012   Procedure: HARDWARE REMOVAL;  Surgeon: Mcarthur Rossetti, MD;  Location: [facility];  Service: Orthopedics;  Laterality: Left;   IR LUMBAR [place] W/IMG GUIDE  06/08/2017   RADIOLOGY WITH ANESTHESIA N/A 06/08/2017   Procedure: DISC ASPIRATION;  Surgeon: Luanne Bras, MD;  Location: [place];  Service: Radiology;  Laterality: N/A;   TIBIA IM NAIL INSERTION  01/27/2012   Procedure: INTRAMEDULLARY (IM) NAIL TIBIAL;  Surgeon: Mcarthur Rossetti, MD;  Location: [facility];  Service: Orthopedics;  Laterality: Left;  Removal of IM Rod and Screws Left Tibia with Exchange Nail, Allograft Bone Graft Left Tibia   Family History:  Family History  Problem Relation Age of Onset   Diabetes Mother    Alcohol abuse Father    Cirrhosis Father    Aneurysm Father    Family Psychiatric  History: None disclosed Social History:  Social History   Substance and Sexual Activity  Alcohol Use Yes   Alcohol/week: 1.0 standard drink   Types: 1 Standard drinks or equivalent per week   Comment: occasionally     Social History   Substance and Sexual Activity  Drug Use Yes   Frequency: 7.0 times per week   Types: IV, Methamphetamines, Heroin   Comment: heroin daily, meth daily    Social History   Socioeconomic History   Marital status: Single    Spouse name: Not on file   Number of children: Not on file   Years of education: 12   Highest education level: High school graduate   Occupational History   Not on file  Tobacco Use   Smoking status: Every Day    Packs/day: 1.00    Years: 33.00    Pack years: 33.00    Types: Cigarettes   Smokeless tobacco: Former    Quit date: 08/08/2006   Tobacco comments:    1 PPD  Substance and Sexual Activity   Alcohol use: Yes    Alcohol/week: 1.0 standard drink    Types: 1 Standard drinks or equivalent per week    Comment: occasionally   Drug use: Yes    Frequency: 7.0 times per week    Types: IV, Methamphetamines, Heroin    Comment: heroin daily, meth daily   Sexual activity: Not Currently  Other Topics Concern  Not on file  Social History Narrative   Not on file   Social Determinants of Health   Financial Resource Strain: Not on file  Food Insecurity: Not on file  Transportation Needs: Not on file  Physical Activity: Not on file  Stress: Not on file  Social Connections: Not on file    Hospital Course:  After the above admission evaluation, [place]dex's presenting symptoms were noted. He was recommended for mood stabilization treatments. The medication regimen targeting those presenting symptoms were discussed with him & initiated with his consent. He was started on Zoloft for his depression, Gabapentin for anxiety and substance withdrawal, and Trazodone for sleep. He was also started on metformin for his diabetes. He was strongly encouraged to follow up with his PCP for diabetes management and lab draw to determine his Hep C viral load. He verbalized understanding. His UDS and BAL on arrival to the ED were negative. He was placed on an opiate withdrawal protocol due to his admission of regular fentanyl use. He was medicated, stabilized & discharged on the medications as listed on his discharge medication list below. Besides the mood stabilization treatments, Newman was also enrolled & participated in the group counseling sessions being offered & held on this unit. He learned coping skills. He presented no other significant  pre-existing medical issues that required treatment. He tolerated his treatment regimen without any adverse effects or reactions reported.   During the course of his hospitalization, the 15-minute checks were adequate to ensure patient's safety. Rudolpho did not display any dangerous, violent or suicidal behavior on the unit.  He interacted with patients & staff appropriately, participated appropriately in the group sessions/therapies. His medications were addressed & adjusted to meet his needs. He was recommended for outpatient follow-up care & medication management upon discharge to assure continuity of care & mood stability.  He was referred to two residential substance abuse treatment facilities and was instructed to call each one daily for bed availability. There were no beds available at the time of his discharge form the inpatient hospital. At the time of discharge patient is not reporting any acute suicidal/homicidal ideations. He feels more confident about his/her self-care & in managing his mental health. He currently denies any new issues or concerns. Education and supportive counseling provided throughout his hospital stay & upon discharge.   Today upon his discharge evaluation with the attending psychiatrist, Woods shares he is doing well and is ready to be discharged. He denies any other specific concerns. He is sleeping well. His appetite is good. He denies other physical complaints. He denies AH/VH, delusional thoughts or paranoia. He does not appear to be responding to any internal stimuli. He feels that his medications have been helpful & is in agreement to continue his current treatment regimen as recommended. He was able to engage in safety planning including plan to return to [facility] or contact emergency services if he feels unable to maintain his own safety or the safety of others. Pt had no further questions, comments, or concerns. He left [facility] with all personal belongings in no apparent distress.  Transportation to his mother's, where he will be residing, via [employer].    Physical Findings: AIMS: Facial and Oral Movements Muscles of Facial Expression: None, normal Lips and Perioral Area: None, normal Jaw: None, normal Tongue: None, normal,Extremity Movements Upper (arms, wrists, hands, fingers): None, normal Lower (legs, knees, ankles, toes): None, normal, Trunk Movements Neck, shoulders, hips: None, normal, Overall Severity Severity of abnormal movements (highest  score from questions above): None, normal Incapacitation due to abnormal movements: None, normal Patient's awareness of abnormal movements (rate only patient's report): No Awareness, Dental Status Current problems with teeth and/or dentures?: No Does patient usually wear dentures?: No  CIWA:  CIWA-Ar Total: 1 COWS:  COWS Total Score: 1  Musculoskeletal: Strength & Muscle Tone: within normal limits Gait & Station: normal Patient leans: N/A  Psychiatric Specialty Exam:  Presentation  General Appearance: Appropriate for Environment; Casual  Eye Contact:Good  Speech:Clear and Coherent; Normal Rate  Speech Volume:Normal  Handedness:Right  Mood and Affect  Mood:Dysphoric  Affect:Appropriate; Congruent  Thought Process  Thought Processes:Coherent; Goal Directed  Descriptions of Associations:Intact  Orientation:Full (Time, Place and Person)  Thought Content:Logical; WDL  History of Schizophrenia/Schizoaffective disorder:No  Duration of Psychotic Symptoms:No data recorded Hallucinations:Hallucinations: None  Ideas of Reference:None  Suicidal Thoughts:Suicidal Thoughts: No  Homicidal Thoughts:Homicidal Thoughts: No  Sensorium  Memory:Immediate Fair; Recent Fair  Judgment:Poor  Insight:Poor  Executive Functions  Concentration:Good  Attention Span:Good  [place] of Knowledge:Good  Language:Good  Psychomotor Activity  Psychomotor Activity:Psychomotor Activity:  Normal  Assets  Assets:Communication Skills; Desire for Improvement; Resilience  Sleep  Sleep:Sleep: Fair Number of Hours of Sleep: 4.25  Physical Exam: Physical Exam Vitals and nursing note reviewed.  Constitutional:      Appearance: Normal appearance.  HENT:     Head: Normocephalic.  Pulmonary:     [place]: Pulmonary effort is normal.  Musculoskeletal:        General: Normal range of motion.     Cervical back: Normal range of motion.  Neurological:     General: No focal deficit present.     Mental Status: He is alert and oriented to person, place, and time.  Psychiatric:        Attention and Perception: Attention normal. He does not perceive auditory or visual hallucinations.        Mood and Affect: Mood normal.        Speech: Speech normal.        Behavior: Behavior normal.        Thought Content: Thought content normal. Thought content is not paranoid or delusional. Thought content does not include homicidal or suicidal ideation. Thought content does not include homicidal or suicidal plan.   Review of Systems  Constitutional: Negative.  Negative for fever.  HENT: Negative.  Negative for congestion, sinus pain and sore throat.   Respiratory: Negative.  Negative for cough and shortness of breath.   Cardiovascular: Negative.  Negative for chest pain.  Gastrointestinal: Negative.   Genitourinary: Negative.   Musculoskeletal: Negative.   Neurological: Negative.    Blood pressure 126/85, pulse 62, temperature 98.3 F (36.8 C), temperature source Oral, resp. rate 18, height 5' 3"  (1.6 m), weight 70.3 kg, SpO2 100 %. Body mass index is 27.45 kg/m.   Social History   Tobacco Use  Smoking Status Every Day   Packs/day: 1.00   Years: 33.00   Pack years: 33.00   Types: Cigarettes  Smokeless Tobacco Former   Quit date: 08/08/2006  Tobacco Comments   1 PPD   Tobacco Cessation:  A prescription for an FDA-approved tobacco cessation medication provided at  discharge   Blood Alcohol level:  Lab Results  Component Value Date   [facility] <10 09/23/2020   ETH <10 41/32/4401    Metabolic Disorder Labs:  Lab Results  Component Value Date   HGBA1C 6.8 (H) 09/26/2020   MPG 148.46 09/26/2020   MPG 151.33 08/17/2020  Lab Results  Component Value Date   PROLACTIN 31.3 (H) 05/23/2015   Lab Results  Component Value Date   CHOL 137 09/25/2020   TRIG 61 09/25/2020   HDL 32 (L) 09/25/2020   CHOLHDL 4.3 09/25/2020   VLDL 12 09/25/2020   LDLCALC 93 09/25/2020   LDLCALC 87 05/23/2015    See Psychiatric Specialty Exam and Suicide Risk Assessment completed by Attending Physician prior to discharge.  Discharge destination:  Other:  [employer]  Is patient on multiple antipsychotic therapies at discharge:  No   Has Patient had three or more failed trials of antipsychotic monotherapy by history:  No  Recommended Plan for Multiple Antipsychotic Therapies: NA  Discharge Instructions     Diet - low sodium heart healthy   Complete by: As directed    Increase activity slowly   Complete by: As directed       Allergies as of 09/29/2020   No Known Allergies      Medication List     STOP taking these medications    amLODipine 5 MG tablet Commonly known as: NORVASC   GOODY HEADACHE PO   polyethylene glycol 17 g packet Commonly known as: MIRALAX / GLYCOLAX       TAKE these medications      Indication  gabapentin 300 MG capsule Commonly known as: NEURONTIN Take 1 capsule (300 mg total) by mouth 3 (three) times daily.  Indication: Alcohol Withdrawal Syndrome, withdrawal cravings/anxiety   metFORMIN 500 MG 24 hr tablet Commonly known as: GLUCOPHAGE-XR Take 1 tablet (500 mg total) by mouth 2 (two) times daily with a meal. What changed: when to take this  Indication: Type 2 Diabetes   naloxone 4 MG/0.1ML Liqd nasal spray kit Commonly known as: NARCAN Take in case of overdose  Indication: Opioid Overdose   nicotine 21  mg/24hr patch Commonly known as: NICODERM CQ - dosed in mg/24 hours Place 1 patch (21 mg total) onto the skin daily at 6 (six) AM. Start taking on: September 30, 2020  Indication: Nicotine Addiction   sertraline 50 MG tablet Commonly known as: ZOLOFT Take 1 tablet (50 mg total) by mouth at bedtime. What changed: when to take this  Indication: Major Depressive Disorder   traZODone 50 MG tablet Commonly known as: DESYREL Take 1 tablet (50 mg total) by mouth at bedtime as needed for sleep. What changed:  medication strength how much to take  Indication: [facility] [place]. Call.   Why: You may go to this provider for primary care services.  Please call to schedule an appointment. Contact information: [place] [phone]        [facility]. Go to.   Why: You may go to this provider for therapy and medication management services. Contact information: [place] [address] [phone]         [facility] Call.   Specialty: Addiction Medicine Why: A referral has been made to this provider for residential substance use treatment services.  Please call daily to check on bed availability. Contact information: [address] [place] [address] [phone]         [facility]. Call.   Why: Please call this provider each day to check on an available bed. Contact information: Address: [address] [place], [place] [address]  Phone: [phone]                Follow-up recommendations:  Activity:  as tolerated Diet:  heart healthy  Comments:  Prescriptions were given at discharge.  Patient is agreeable with the discharge plan.  He was given an opportunity to ask questions.  He appears to feel comfortable with discharge and denies any current suicidal or homicidal  thoughts.   Patient is instructed prior to discharge to: Take all medications as prescribed by his mental healthcare provider. Report any adverse effects and or reactions from the medicines to his outpatient provider promptly. Patient has been instructed & cautioned: To not engage in alcohol and or illegal drug use while on prescription medicines. In the event of worsening symptoms, patient is instructed to call the crisis hotline, 911 and or go to the nearest ED for appropriate evaluation and treatment of symptoms. To follow-up with his primary care provider for your other medical issues, concerns and or health care needs.   Signed: Ethelene Hal, NP 09/29/2020, 1:30 PM

## 2020-09-29 NOTE — Group Note (Unsigned)
Recreation Therapy Group Note   Group Topic:Stress Management  Group Date: 09/29/2020 Start Time: 0930 End Time: 0945 Facilitators: Kaeley Vinje A, NT Location: [address] Dayroom       Affect/Mood: {RT BHH Affect/Mood:26271}   Participation Level: {RT BHH Participation Level:26267}   Participation Quality: {RT BHH Participation Quality:26268}   Behavior: {RT BHH Group Behavior:26269}   Speech/Thought Process: {RT BHH Speech/Thought:26276}   Insight: {RT BHH Insight:26272}   Judgement: {RT BHH Judgement:26278}   Modes of Intervention: {RT BHH Modes of Intervention:26277}   Patient Response to Interventions:  {RT BHH Patient Response to Intervention:26274}   Education Outcome:  {RT BHH Education Outcome:26279}   Clinical Observations/Individualized Feedback: *** was *** in their participation of session activities and group discussion. Pt identified ***   Plan: {RT BHH Tx Plan:26280}   Archer Moist A, NT,  09/29/2020 11:18 AM 

## 2020-09-29 NOTE — BHH Counselor (Signed)
CSW spoke with the Pt who states that he will be discharging to his mother's home.  CSW called Mrs. Benfield (Mother) who states that her son did not contact her but will be allowing him to come back to the home.  She states that she is 45 years old and works daily and worries that he will take drug while she is at work.  CSW informed the mother that if she comes home and believes that her son is intoxicated on a substance then she can call 911 and have him removed from the property or brought to the hospital.  Mrs. Benfield states that she was not aware that this could be done and will be doing this in the future.  She state that this is the last time she will be accepting her son into her home while he waits on a residential treatment bed.  Mrs. Benfield states that there was also a recent tragedy in the family and her son does not know yet.  She states that she will also bring her son back to the behavioral urgent care if needed as well.

## 2020-09-29 NOTE — Group Note (Signed)
[facility] LCSW Group Therapy Note    Group Date: 09/29/2020 Start Time: 1300 End Time: 1400  Type of Therapy and Topic:  Group Therapy:  Overcoming Obstacles  Participation Level:  BHH PARTICIPATION LEVEL: Did Not Attend  Mood:  Description of Group:   In this group patients will be encouraged to explore what they see as obstacles to their own wellness and recovery. They will be guided to discuss their thoughts, feelings, and behaviors related to these obstacles. The group will process together ways to cope with barriers, with attention given to specific choices patients can make. Each patient will be challenged to identify changes they are motivated to make in order to overcome their obstacles. This group will be process-oriented, with patients participating in exploration of their own experiences as well as giving and receiving support and challenge from other group members.  Therapeutic Goals: 1. Patient will identify personal and current obstacles as they relate to admission. 2. Patient will identify barriers that currently interfere with their wellness or overcoming obstacles.  3. Patient will identify feelings, thought process and behaviors related to these barriers. 4. Patient will identify two changes they are willing to make to overcome these obstacles:    Summary of Patient Progress   Did not attend.  Was discharged prior to group.    Therapeutic Modalities:   Cognitive Behavioral Therapy Solution Focused Therapy Motivational Interviewing Relapse Prevention Therapy   Aram Beecham, LCSWA
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
## 2020-09-29 NOTE — Progress Notes (Signed)
Discharge Note:  Patient discharged via [facility] to his mother's house.  Patient was in hurry to be discharged.  Patient went to dining room to eat lunch.  When [facility] arrived and all discharge paperwork completed, nurse walked patient out front door to meet [employer].  Patient walked over to side of column at front and picked up cigarette which was laying on outside of post.   Nurse asked patient where he got the cigarette.  Patient stated another patient which was discharged earlier, left this cigarette on side of post for him.   NP and charge nurse informed.  Patient stated he would be good to his mother this time.  Patient was informed by nurse and driver that he could not smoke in vehicle or on [facility] property.   Suicide prevention information given and discussed with patient who stated he understood and had no questions.  Patient stated he received all his belongings, clothing, toiletries, misc items, etc.  Patient thanked staff for their assistance.  Patient stated he received all his belongings.

## 2020-09-29 NOTE — Group Note (Signed)
Recreation Therapy Group Note   Group Topic:Stress Management  Group Date: 09/29/2020 Start Time: 0930 End Time: 0945 Facilitators: Caroll Rancher, LRT/CTRS Location: [address] Day[address]  Goal Area(s) Addresses:  Patient will identify positive stress management techniques. Patient will identify benefits of using stress management post d/c.  Group Description: LRT played a meditation that focused on setting personal boundaries.  The meditation spoke on the importance of putting your feelings and what you want even if it makes others upset.  It also expressed your self care is more important as your want to please others.   Affect/Mood: N/A   Participation Level: Did not attend    Clinical Observations/Individualized Feedback: Pt did not attend group session.    Plan: Continue to engage patient in RT group sessions 2-3x/week.   Caroll Rancher, LRT/CTRS 09/29/2020 11:47 AM

## 2020-10-03 NOTE — [facility] Group Notes (Signed)
Spiritual care group on loss and grief facilitated by Chaplain Dyanne Carrel, [facility]   Group goal: Support / education around grief.   Identifying grief patterns, feelings / responses to grief, identifying behaviors that may emerge from grief responses, identifying when one may call on an ally or coping skill.   Group Description:   Following introductions and group rules, group opened with psycho-social ed. Group members engaged in facilitated dialog around topic of loss, with particular support around experiences of loss in their lives. Group Identified types of loss (relationships / self / things) and identified patterns, circumstances, and changes that precipitate losses. Reflected on thoughts / feelings around loss, normalized grief responses, and recognized variety in grief experience.   Group engaged in visual explorer activity, identifying elements of grief journey as well as needs / ways of caring for themselves. Group reflected on Worden's tasks of grief.   Group facilitation drew on brief cognitive behavioral, narrative, and Adlerian modalities   Patient progress: Mallory arrived partway through group and engaged appropriately for the time he was present.  [address] Bcc Pager, [phone]

## 2020-10-08 ENCOUNTER — Other Ambulatory Visit: Payer: Self-pay

## 2020-10-08 ENCOUNTER — Emergency Department (HOSPITAL_COMMUNITY)
Admission: EM | Admit: 2020-10-08 | Discharge: 2020-10-08 | Disposition: A | Discharge status: Rehabilitation Facility | Payer: Self-pay | Attending: Emergency Medicine | Admitting: Emergency Medicine

## 2020-10-08 ENCOUNTER — Ambulatory Visit (HOSPITAL_COMMUNITY)
Admission: EM | Admit: 2020-10-08 | Discharge: 2020-10-09 | Disposition: A | Discharge status: Home / Self Care | Payer: No Payment, Other | Attending: Family | Admitting: Family

## 2020-10-08 ENCOUNTER — Encounter (HOSPITAL_COMMUNITY): Payer: Self-pay | Admitting: *Deleted

## 2020-10-08 DIAGNOSIS — F1110 Opioid abuse, uncomplicated: Secondary | ICD-10-CM | POA: Insufficient documentation

## 2020-10-08 DIAGNOSIS — J45909 Unspecified asthma, uncomplicated: Secondary | ICD-10-CM | POA: Insufficient documentation

## 2020-10-08 DIAGNOSIS — Z5900 Homelessness unspecified: Secondary | ICD-10-CM | POA: Insufficient documentation

## 2020-10-08 DIAGNOSIS — F1994 Other psychoactive substance use, unspecified with psychoactive substance-induced mood disorder: Secondary | ICD-10-CM | POA: Insufficient documentation

## 2020-10-08 DIAGNOSIS — X58XXXA Exposure to other specified factors, initial encounter: Secondary | ICD-10-CM | POA: Insufficient documentation

## 2020-10-08 DIAGNOSIS — F332 Major depressive disorder, recurrent severe without psychotic features: Secondary | ICD-10-CM | POA: Insufficient documentation

## 2020-10-08 DIAGNOSIS — S00522A Blister (nonthermal) of oral cavity, initial encounter: Secondary | ICD-10-CM | POA: Insufficient documentation

## 2020-10-08 DIAGNOSIS — Z79899 Other long term (current) drug therapy: Secondary | ICD-10-CM | POA: Insufficient documentation

## 2020-10-08 DIAGNOSIS — K219 Gastro-esophageal reflux disease without esophagitis: Secondary | ICD-10-CM | POA: Insufficient documentation

## 2020-10-08 DIAGNOSIS — K469 Unspecified abdominal hernia without obstruction or gangrene: Secondary | ICD-10-CM | POA: Insufficient documentation

## 2020-10-08 DIAGNOSIS — R45851 Suicidal ideations: Secondary | ICD-10-CM | POA: Insufficient documentation

## 2020-10-08 DIAGNOSIS — J449 Chronic obstructive pulmonary disease, unspecified: Secondary | ICD-10-CM | POA: Insufficient documentation

## 2020-10-08 DIAGNOSIS — F17210 Nicotine dependence, cigarettes, uncomplicated: Secondary | ICD-10-CM | POA: Insufficient documentation

## 2020-10-08 DIAGNOSIS — E119 Type 2 diabetes mellitus without complications: Secondary | ICD-10-CM | POA: Insufficient documentation

## 2020-10-08 DIAGNOSIS — F1910 Other psychoactive substance abuse, uncomplicated: Secondary | ICD-10-CM | POA: Insufficient documentation

## 2020-10-08 DIAGNOSIS — I10 Essential (primary) hypertension: Secondary | ICD-10-CM | POA: Insufficient documentation

## 2020-10-08 DIAGNOSIS — F1510 Other stimulant abuse, uncomplicated: Secondary | ICD-10-CM | POA: Insufficient documentation

## 2020-10-08 DIAGNOSIS — F319 Bipolar disorder, unspecified: Secondary | ICD-10-CM | POA: Insufficient documentation

## 2020-10-08 DIAGNOSIS — Z7289 Other problems related to lifestyle: Secondary | ICD-10-CM | POA: Insufficient documentation

## 2020-10-08 DIAGNOSIS — Z20822 Contact with and (suspected) exposure to covid-19: Secondary | ICD-10-CM | POA: Insufficient documentation

## 2020-10-08 DIAGNOSIS — Z7984 Long term (current) use of oral hypoglycemic drugs: Secondary | ICD-10-CM | POA: Insufficient documentation

## 2020-10-08 LAB — COMPREHENSIVE METABOLIC PANEL
ALT: 28 U/L (ref 0–44)
AST: 27 U/L (ref 15–41)
Albumin: 3.5 g/dL (ref 3.5–5.0)
Alkaline Phosphatase: 38 U/L (ref 38–126)
Anion gap: 6 (ref 5–15)
BUN: 7 mg/dL (ref 6–20)
CO2: 27 mmol/L (ref 22–32)
Calcium: 8.8 mg/dL — ABNORMAL LOW (ref 8.9–10.3)
Chloride: 106 mmol/L (ref 98–111)
Creatinine, Ser: 0.73 mg/dL (ref 0.61–1.24)
GFR, Estimated: >60 mL/min (ref >60)
Glucose, Bld: 139 mg/dL — ABNORMAL HIGH (ref 70–99)
Potassium: 3.7 mmol/L (ref 3.5–5.1)
Sodium: 139 mmol/L (ref 135–145)
Total Bilirubin: 0.5 mg/dL (ref 0.3–1.2)
Total Protein: 6.8 g/dL (ref 6.5–8.1)

## 2020-10-08 LAB — RESP PANEL BY RT-PCR (FLU A&B, COVID) ARPGX2
Influenza A by PCR: NEGATIVE
Influenza B by PCR: NEGATIVE
SARS Coronavirus 2 by RT PCR: NEGATIVE

## 2020-10-08 LAB — CBC WITH DIFFERENTIAL/PLATELET
Abs Immature Granulocytes: 0 10*3/uL (ref 0.00–0.07)
Basophils Absolute: 0 10*3/uL (ref 0.0–0.1)
Basophils Relative: 0 %
Eosinophils Absolute: 0.1 10*3/uL (ref 0.0–0.5)
Eosinophils Relative: 3 %
HCT: 38.5 % — ABNORMAL LOW (ref 39.0–52.0)
Hemoglobin: 12.6 g/dL — ABNORMAL LOW (ref 13.0–17.0)
Immature Granulocytes: 0 %
Lymphocytes Relative: 28 %
Lymphs Abs: 1.1 10*3/uL (ref 0.7–4.0)
MCH: 31 pg (ref 26.0–34.0)
MCHC: 32.7 g/dL (ref 30.0–36.0)
MCV: 94.6 fL (ref 80.0–100.0)
Monocytes Absolute: 0.5 10*3/uL (ref 0.1–1.0)
Monocytes Relative: 13 %
Neutro Abs: 2.1 10*3/uL (ref 1.7–7.7)
Neutrophils Relative %: 56 %
Platelets: 169 10*3/uL (ref 150–400)
RBC: 4.07 MIL/uL — ABNORMAL LOW (ref 4.22–5.81)
RDW: 14.1 % (ref 11.5–15.5)
WBC: 3.8 10*3/uL — ABNORMAL LOW (ref 4.0–10.5)
nRBC: 0 % (ref 0.0–0.2)

## 2020-10-08 LAB — LIPID PANEL
Cholesterol: 94 mg/dL (ref 0–200)
HDL: 30 mg/dL — ABNORMAL LOW (ref >40)
LDL Cholesterol: 49 mg/dL (ref 0–99)
Total CHOL/HDL Ratio: 3.1 RATIO
Triglycerides: 77 mg/dL (ref <150)
VLDL: 15 mg/dL (ref 0–40)

## 2020-10-08 LAB — ETHANOL: Alcohol, Ethyl (B): <10 mg/dL (ref <10)

## 2020-10-08 LAB — RAPID URINE DRUG SCREEN, HOSP PERFORMED
Amphetamines: NOT DETECTED
Barbiturates: NOT DETECTED
Benzodiazepines: NOT DETECTED
Cocaine: NOT DETECTED
Opiates: NOT DETECTED
Tetrahydrocannabinol: NOT DETECTED

## 2020-10-08 LAB — TSH: TSH: 1.421 u[IU]/mL (ref 0.350–4.500)

## 2020-10-08 MED ORDER — METFORMIN HCL ER 500 MG PO TB24
500.0000 mg | ORAL_TABLET | Freq: Two times a day (BID) | ORAL | Status: DC
  Administered 2020-10-08 – 2020-10-09 (×2): 500 mg via ORAL
  Filled 2020-10-08 (×2): qty 1

## 2020-10-08 MED ORDER — ACETAMINOPHEN 325 MG PO TABS: 650.0000 mg | ORAL_TABLET | Freq: Four times a day (QID) | ORAL | Status: DC | PRN

## 2020-10-08 MED ORDER — GABAPENTIN 300 MG PO CAPS
300.0000 mg | ORAL_CAPSULE | Freq: Three times a day (TID) | ORAL | Status: DC
  Administered 2020-10-08 – 2020-10-09 (×3): 300 mg via ORAL
  Filled 2020-10-08 (×3): qty 1

## 2020-10-08 MED ORDER — NICOTINE 21 MG/24HR TD PT24
21.0000 mg | MEDICATED_PATCH | Freq: Every day | TRANSDERMAL | Status: DC
  Filled 2020-10-08: qty 1

## 2020-10-08 MED ORDER — TRAZODONE HCL 50 MG PO TABS
50.0000 mg | ORAL_TABLET | Freq: Every evening | ORAL | Status: DC | PRN
  Administered 2020-10-08: 50 mg via ORAL
  Filled 2020-10-08: qty 1

## 2020-10-08 MED ORDER — ALUM & MAG HYDROXIDE-SIMETH 200-200-20 MG/5ML PO SUSP: 30.0000 mL | ORAL | Status: DC | PRN

## 2020-10-08 MED ORDER — SERTRALINE HCL 50 MG PO TABS
50.0000 mg | ORAL_TABLET | Freq: Every day | ORAL | Status: DC
Start: 2020-10-08 — End: 2020-10-09
  Administered 2020-10-08: 50 mg via ORAL
  Filled 2020-10-08: qty 1

## 2020-10-08 MED ORDER — MAGNESIUM HYDROXIDE 400 MG/5ML PO SUSP: 30.0000 mL | Freq: Every day | ORAL | Status: DC | PRN

## 2020-10-08 MED ORDER — HYDROXYZINE HCL 25 MG PO TABS
25.0000 mg | ORAL_TABLET | Freq: Three times a day (TID) | ORAL | Status: DC | PRN
  Administered 2020-10-08: 25 mg via ORAL
  Filled 2020-10-08: qty 1

## 2020-10-08 NOTE — ED Provider Notes (Addendum)
Behavioral Health Admission H&P [facility] & OBS)  Date: 10/08/20 Patient Name: Joshua Thomas MRN: [id number] Chief Complaint: No chief complaint on file.     Diagnoses:  Final diagnoses:  Substance induced mood disorder (HCC)  Amphetamine abuse (HCC)    HPI: Joshua Thomas is a 45 year old male, direct admit to continuous observation from [facility] emergency department.  He reports recent stressors including relapse on substances including heroin and methamphetamine.  He reports last use of heroin on yesterday.  He reports last use of methamphetamine approximately 2 weeks ago.  He reports readiness to stop substance use.  He states "I need rehab, [facility] in [place] would not let me in because of my blood sugar."  [name] was recently discharged from [facility] behavioral health on 09/29/2020.  He reports he did not follow-up with outpatient psychiatry and did not continue medications once discharged.  He reports he immediately really relapsed on substance because he "gave up" after not being admitted to [facility] in [place].  He would like to restart medications at this time.  [name] has been diagnosed with major depressive disorder, opioid abuse, amphetamine abuse, substance-induced mood disorder and bipolar disorder.  He is not currently linked with outpatient psychiatry follow-up.  Patient is assessed face-to-face by nurse practitioner. He is seated in assessment area, no acute distress.  He is alert and oriented, pleasant and cooperative during assessment.  He reports depressed mood with congruent affect. He denies suicidal and homicidal ideations.  He contracts verbally for safety with this Clinical research associate.  He has normal speech and behavior.  He denies both auditory and visual hallucinations.  Patient is able to converse coherently with goal-directed thoughts and no distractibility or preoccupation.  He denies paranoia.  Objectively there is no evidence of psychosis/mania or delusional thinking.  Joshua Thomas is currently  homeless in [place].  He denies access to weapons.  He is currently not employed.  He endorses decreased sleep and average appetite.  He denies substance use aside from methamphetamine and heroin.  Patient offered support and encouragement.  He indicates he is interested in "[facility]" program in [place], reports he has heard it is "wonderful."  Additionally he is interested in [place] and [place] rescue/[facility].   Patient offered support and encouragement.  PHQ 2-9:  Flowsheet Row Office Visit from 03/14/2018 in [facility] Office Visit from 02/28/2018 in [facility] Office Visit from 02/14/2018 in [facility]  Thoughts that you would be better off dead, or of hurting yourself in some way Not at all Several days Several days  PHQ-9 Total Score 15 18 23        Flowsheet Row [facility] Most recent reading at 10/08/2020  5:48 PM ED from 10/08/2020 in [facility] Most recent reading at 10/08/2020  7:59 AM [facility] ([facility] Most recent reading at 09/24/2020  6:59 PM  C-SSRS RISK CATEGORY Low Risk High Risk High Risk        Total Time spent with patient: 20 minutes  Musculoskeletal  Strength & Muscle Tone: within normal limits Gait & Station: normal Patient leans: N/A  Psychiatric Specialty Exam  Presentation General Appearance: Appropriate for Environment; Casual  Eye Contact:Good  Speech:Clear and Coherent; Normal Rate  Speech Volume:Normal  Handedness:Right   Mood and Affect  Mood:Depressed  Affect:Congruent; Appropriate   Thought Process  Thought Processes:Coherent; Goal Directed; Linear  Descriptions of Associations:Intact  Orientation:Full (Time, Place and Person)  Thought Content:WDL; Logical  Diagnosis of Schizophrenia or Schizoaffective disorder  in past: No   Hallucinations:Hallucinations: None  Ideas of Reference:None  Suicidal Thoughts:Suicidal Thoughts: No  Homicidal Thoughts:Homicidal Thoughts: No   Sensorium  Memory:Immediate Good; Recent Good; Remote Good  Judgment:Fair  Insight:Fair   Executive Functions  Concentration:Good  Attention Span:Good  Recall:Good  Fund of Knowledge:Good  Language:Good   Psychomotor Activity  Psychomotor Activity:Psychomotor Activity: Normal   Assets  Assets:Communication Skills; Desire for Improvement; Intimacy; Leisure Time; Physical Health; Resilience; Social Support   Sleep  Sleep:Sleep: Fair   Nutritional Assessment (For OBS and FBC admissions only) Has the patient had a weight loss or gain of 10 pounds or more in the last 3 months?: No Has the patient had a decrease in food intake/or appetite?: No Does the patient have dental problems?: No Does the patient have eating habits or behaviors that may be indicators of an eating disorder including binging or inducing vomiting?: No Has the patient recently lost weight without trying?: 0 Has the patient been eating poorly because of a decreased appetite?: 0 Malnutrition Screening Tool Score: 0   Physical Exam ROS  There were no vitals taken for this visit. There is no height or weight on file to calculate BMI.  Past Psychiatric History: Major depressive disorder, opioid abuse, amphetamine abuse, bipolar disorder, substance-induced mood disorder  Is the patient at risk to self? No  Has the patient been a risk to self in the past 6 months? Yes .    Has the patient been a risk to self within the distant past? No   Is the patient a risk to others? No   Has the patient been a risk to others in the past 6 months? No   Has the patient been a risk to others within the distant past? 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   COPD (chronic obstructive pulmonary disease) (HCC)    DDD (degenerative disc disease), lumbar    Degenerative disc disease, lumbar    Depression    Discitis    GERD (gastroesophageal reflux disease)    Hepatitis C    Hepatitis C    History of stomach ulcers    Hx of diabetes mellitus    due to infection   Hypertension    Lung nodules    3 on L and 2 on R   PTSD (post-traumatic stress disorder)    Sciatica    Substance abuse (HCC)     Past Surgical History:  Procedure Laterality Date   FRACTURE SURGERY     HARDWARE REMOVAL  01/27/2012   Procedure: HARDWARE REMOVAL;  Surgeon: Kathryne Hitch, MD;  Location: [facility] ORS;  Service: Orthopedics;  Laterality: Left;   IR LUMBAR DISC ASPIRATION W/IMG GUIDE  06/08/2017   RADIOLOGY WITH ANESTHESIA N/A 06/08/2017   Procedure: DISC ASPIRATION;  Surgeon: Julieanne Cotton, MD;  Location: [facility] OR;  Service: Radiology;  Laterality: N/A;   TIBIA IM NAIL INSERTION  01/27/2012   Procedure: INTRAMEDULLARY (IM) NAIL TIBIAL;  Surgeon: Kathryne Hitch, MD;  Location: [facility];  Service: Orthopedics;  Laterality: Left;  Removal of IM Rod  and Screws Left Tibia with Exchange Nail, Allograft Bone Graft Left Tibia    Family History:  Family History  Problem Relation Age of Onset   Diabetes Mother    Alcohol abuse Father    Cirrhosis Father    Aneurysm Father     Social History:  Social History   Socioeconomic History   Marital status: Single    Spouse name: Not on file   Number of children: Not on file   Years of education: 12   Highest education level: High school graduate  Occupational History   Not on file  Tobacco Use   Smoking status: Every Day    Packs/day: 1.00    Years: 33.00    Pack years: 33.00    Types: Cigarettes   Smokeless tobacco: Former    Quit date: 08/08/2006   Tobacco  comments:    1 PPD  Substance and Sexual Activity   Alcohol use: Yes    Alcohol/week: 1.0 standard drink    Types: 1 Standard drinks or equivalent per week    Comment: occasionally   Drug use: Yes    Frequency: 7.0 times per week    Types: IV, Methamphetamines, Heroin    Comment: heroin daily, meth daily   Sexual activity: Not Currently  Other Topics Concern   Not on file  Social History Narrative   Not on file   Social Determinants of Health   Financial Resource Strain: Not on file  Food Insecurity: Not on file  Transportation Needs: Not on file  Physical Activity: Not on file  Stress: Not on file  Social Connections: Not on file  Intimate Partner Violence: Not on file    SDOH:  SDOH Screenings   Alcohol Screen: Low Risk    Last Alcohol Screening Score (AUDIT): 1  Depression (PHQ2-9): Not on file  Financial Resource Strain: Not on file  Food Insecurity: Not on file  Housing: Not on file  Physical Activity: Not on file  Social Connections: Not on file  Stress: Not on file  Tobacco Use: High Risk   Smoking Tobacco Use: Every Day   Smokeless Tobacco Use: Former  Transportation Needs: Not on file    Last Labs:  Admission on 10/08/2020, Discharged on 10/08/2020  Component Date Value Ref Range Status   Sodium 10/08/2020 139  135 - 145 mmol/L Final   Potassium 10/08/2020 3.7  3.5 - 5.1 mmol/L Final   Chloride 10/08/2020 106  98 - 111 mmol/L Final   CO2 10/08/2020 27  22 - 32 mmol/L Final   Glucose, Bld 10/08/2020 139 (A) 70 - 99 mg/dL Final   Glucose reference range applies only to samples taken after fasting for at least 8 hours.   BUN 10/08/2020 7  6 - 20 mg/dL Final   Creatinine, Ser 10/08/2020 0.73  0.61 - 1.24 mg/dL Final   Calcium 78/29/5621 8.8 (A) 8.9 - 10.3 mg/dL Final   Total Protein 30/86/5784 6.8  6.5 - 8.1 g/dL Final   Albumin 69/62/9528 3.5  3.5 - 5.0 g/dL Final   AST 41/32/4401 27  15 - 41 U/L Final   ALT 10/08/2020 28  0 - 44 U/L Final    Alkaline Phosphatase 10/08/2020 38  38 - 126 U/L Final   Total Bilirubin 10/08/2020 0.5  0.3 - 1.2 mg/dL Final   GFR, Estimated 10/08/2020 >60  >60 mL/min Final   Comment: (NOTE) Calculated using the CKD-EPI Creatinine Equation (2021)    Anion gap 10/08/2020 6  5 - 15 Final   Performed at [facility], [address], [place], [place] [address]   WBC 10/08/2020 3.8 (A) 4.0 - 10.5 K/uL Final   RBC 10/08/2020 4.07 (A) 4.22 - 5.81 MIL/uL Final   Hemoglobin 10/08/2020 12.6 (A) 13.0 - 17.0 g/dL Final   HCT 60/45/4098 38.5 (A) 39.0 - 52.0 % Final   MCV 10/08/2020 94.6  80.0 - 100.0 fL Final   MCH 10/08/2020 31.0  26.0 - 34.0 pg Final   MCHC 10/08/2020 32.7  30.0 - 36.0 g/dL Final   RDW 11/91/4782 14.1  11.5 - 15.5 % Final   Platelets 10/08/2020 169  150 - 400 K/uL Final   nRBC 10/08/2020 0.0  0.0 - 0.2 % Final   Neutrophils Relative % 10/08/2020 56  % Final   Neutro Abs 10/08/2020 2.1  1.7 - 7.7 K/uL Final   Lymphocytes Relative 10/08/2020 28  % Final   Lymphs Abs 10/08/2020 1.1  0.7 - 4.0 K/uL Final   Monocytes Relative 10/08/2020 13  % Final   Monocytes Absolute 10/08/2020 0.5  0.1 - 1.0 K/uL Final   Eosinophils Relative 10/08/2020 3  % Final   Eosinophils Absolute 10/08/2020 0.1  0.0 - 0.5 K/uL Final   Basophils Relative 10/08/2020 0  % Final   Basophils Absolute 10/08/2020 0.0  0.0 - 0.1 K/uL Final   Immature Granulocytes 10/08/2020 0  % Final   Abs Immature Granulocytes 10/08/2020 0.00  0.00 - 0.07 K/uL Final   Performed at [facility], [address], [place], [place] [address]   Alcohol, Ethyl (B) 10/08/2020 <10  <10 mg/dL Final   Comment: (NOTE) Lowest detectable limit for serum alcohol is 10 mg/dL.  For medical purposes only. Performed at [facility], [address] [place], [place] [address]    Opiates 10/08/2020 NONE DETECTED  NONE DETECTED Final   Cocaine 10/08/2020 NONE DETECTED  NONE DETECTED Final    Benzodiazepines 10/08/2020 NONE DETECTED  NONE DETECTED Final   Amphetamines 10/08/2020 NONE DETECTED  NONE DETECTED Final   Tetrahydrocannabinol 10/08/2020 NONE DETECTED  NONE DETECTED Final   Barbiturates 10/08/2020 NONE DETECTED  NONE DETECTED Final   Comment: (NOTE) DRUG SCREEN FOR MEDICAL PURPOSES ONLY.  IF CONFIRMATION IS NEEDED FOR ANY PURPOSE, NOTIFY LAB WITHIN 5 DAYS.  LOWEST DETECTABLE LIMITS FOR URINE DRUG SCREEN Drug Class                     Cutoff (ng/mL) Amphetamine and metabolites    1000 Barbiturate and metabolites    200 Benzodiazepine                 200 Tricyclics and metabolites     300 Opiates and metabolites        300 Cocaine and metabolites        300 THC                            50 Performed at [facility], [address], [place], [place] [address]    SARS Coronavirus 2 by RT PCR 10/08/2020 NEGATIVE  NEGATIVE Final   Comment: (NOTE) SARS-CoV-2 target nucleic acids are NOT DETECTED.  The SARS-CoV-2 RNA is generally detectable in upper respiratory specimens during the acute phase of infection. The lowest concentration of SARS-CoV-2 viral copies this assay can detect is 138 copies/mL. A negative result does not preclude SARS-Cov-2 infection and  should not be used as the sole basis for treatment or other patient management decisions. A negative result may occur with  improper specimen collection/handling, submission of specimen other than nasopharyngeal swab, presence of viral mutation(s) within the areas targeted by this assay, and inadequate number of viral copies(<138 copies/mL). A negative result must be combined with clinical observations, patient history, and epidemiological information. The expected result is Negative.  Fact Sheet for Patients:  BloggerCourse.com  Fact Sheet for Healthcare Providers:  SeriousBroker.it  This test is no                          t yet  approved or cleared by the [place] FDA and  has been authorized for detection and/or diagnosis of SARS-CoV-2 by FDA under an Emergency Use Authorization (EUA). This EUA will remain  in effect (meaning this test can be used) for the duration of the COVID-19 declaration under Section 564(b)(1) of the Act, 21 U.S.C.section 360bbb-3(b)(1), unless the authorization is terminated  or revoked sooner.       Influenza A by PCR 10/08/2020 NEGATIVE  NEGATIVE Final   Influenza B by PCR 10/08/2020 NEGATIVE  NEGATIVE Final   Comment: (NOTE) The Xpert Xpress SARS-CoV-2/FLU/RSV plus assay is intended as an aid in the diagnosis of influenza from Nasopharyngeal swab specimens and should not be used as a sole basis for treatment. Nasal washings and aspirates are unacceptable for Xpert Xpress SARS-CoV-2/FLU/RSV testing.  Fact Sheet for Patients: BloggerCourse.com  Fact Sheet for Healthcare Providers: SeriousBroker.it  This test is not yet approved or cleared by the [place] FDA and has been authorized for detection and/or diagnosis of SARS-CoV-2 by FDA under an Emergency Use Authorization (EUA). This EUA will remain in effect (meaning this test can be used) for the duration of the COVID-19 declaration under Section 564(b)(1) of the Act, 21 U.S.C. section 360bbb-3(b)(1), unless the authorization is terminated or revoked.  Performed at [facility], [address] [place], [place] [address]    TSH 10/08/2020 1.421  0.350 - 4.500 uIU/mL Final   Comment: Performed by a 3rd Generation assay with a functional sensitivity of <=0.01 uIU/mL. Performed at [facility], [address], [place], [place] [address]    Cholesterol 10/08/2020 94  0 - 200 mg/dL Final   Triglycerides 09/81/1914 77  <150 mg/dL Final   HDL 78/29/5621 30 (A) >40 mg/dL Final   Total CHOL/HDL Ratio 10/08/2020 3.1  RATIO Final   VLDL  10/08/2020 15  0 - 40 mg/dL Final   LDL Cholesterol 10/08/2020 49  0 - 99 mg/dL Final   Comment:        Total Cholesterol/HDL:CHD Risk Coronary Heart Disease Risk Table                     Men   Women  1/2 Average Risk   3.4   3.3  Average Risk       5.0   4.4  2 X Average Risk   9.6   7.1  3 X Average Risk  23.4   11.0        Use the calculated Patient Ratio above and the CHD Risk Table to determine the patient's CHD Risk.        ATP III CLASSIFICATION (LDL):  <100     mg/dL   Optimal  308-657  mg/dL   Near or Above  Optimal  130-159  mg/dL   Borderline  161-096  mg/dL   High  >045     mg/dL   Very High Performed at [facility], [address] [place], [place] [address]   Admission on 09/24/2020, Discharged on 09/29/2020  Component Date Value Ref Range Status   Cholesterol 09/25/2020 137  0 - 200 mg/dL Final   Triglycerides 19/14/7829 61  <150 mg/dL Final   HDL 56/21/3086 32 (A) >40 mg/dL Final   Total CHOL/HDL Ratio 09/25/2020 4.3  RATIO Final   VLDL 09/25/2020 12  0 - 40 mg/dL Final   LDL Cholesterol 09/25/2020 93  0 - 99 mg/dL Final   Comment:        Total Cholesterol/HDL:CHD Risk Coronary Heart Disease Risk Table                     Men   Women  1/2 Average Risk   3.4   3.3  Average Risk       5.0   4.4  2 X Average Risk   9.6   7.1  3 X Average Risk  23.4   11.0        Use the calculated Patient Ratio above and the CHD Risk Table to determine the patient's CHD Risk.        ATP III CLASSIFICATION (LDL):  <100     mg/dL   Optimal  578-469  mg/dL   Near or Above                    Optimal  130-159  mg/dL   Borderline  629-528  mg/dL   High  >413     mg/dL   Very High Performed at [facility], [address] [place], [place] [address]    Hgb A1c MFr Bld 09/26/2020 6.8 (A) 4.8 - 5.6 % Final   Comment: (NOTE) Pre diabetes:          5.7%-6.4%  Diabetes:              >6.4%  Glycemic control for    <7.0% adults with diabetes    Mean Plasma Glucose 09/26/2020 148.46  mg/dL Final   Performed at [facility], [address], [place], [place] [address]   Sodium 09/26/2020 136  135 - 145 mmol/L Final   Potassium 09/26/2020 4.4  3.5 - 5.1 mmol/L Final   Chloride 09/26/2020 103  98 - 111 mmol/L Final   CO2 09/26/2020 27  22 - 32 mmol/L Final   Glucose, Bld 09/26/2020 193 (A) 70 - 99 mg/dL Final   Glucose reference range applies only to samples taken after fasting for at least 8 hours.   BUN 09/26/2020 19  6 - 20 mg/dL Final   Creatinine, Ser 09/26/2020 0.91  0.61 - 1.24 mg/dL Final   Calcium 36/64/4034 9.1  8.9 - 10.3 mg/dL Final   GFR, Estimated 09/26/2020 >60  >60 mL/min Final   Comment: (NOTE) Calculated using the CKD-EPI Creatinine Equation (2021)    Anion gap 09/26/2020 6  5 - 15 Final   Performed at [facility], [address], [place], [place] [address]   Total Protein 09/26/2020 7.5  6.5 - 8.1 g/dL Final   Albumin 56/38/7564 4.0  3.5 - 5.0 g/dL Final   AST 33/29/5188 17  15 - 41 U/L Final   ALT 09/26/2020 39  0 - 44 U/L Final  Alkaline Phosphatase 09/26/2020 46  38 - 126 U/L Final   Total Bilirubin 09/26/2020 0.4  0.3 - 1.2 mg/dL Final   Bilirubin, Direct 09/26/2020 <0.1  0.0 - 0.2 mg/dL Final   Indirect Bilirubin 09/26/2020 NOT CALCULATED  0.3 - 0.9 mg/dL Final   Performed at [facility], [address] [place], [place] [address]   Hepatitis B Surface Ag 09/26/2020 NON REACTIVE  NON REACTIVE Final   HCV Ab 09/26/2020 Reactive (A) NON REACTIVE Final   Comment: (NOTE) The CDC recommends that a Reactive HCV antibody result be followed up  with a HCV Nucleic Acid Amplification test.     Hep A IgM 09/26/2020 NON REACTIVE  NON REACTIVE Final   Hep B C IgM 09/26/2020 NON REACTIVE  NON REACTIVE Final   Performed at [facility], [address] [place] [address] [place], [place] [address]   Glucose, Bld 09/26/2020 208 (A) 70 - 99 mg/dL Final    Comment: Glucose reference range applies only to samples taken after fasting for at least 8 hours. Performed at [facility], [address], [place], [place] [address]    Glucose-Capillary 09/26/2020 251 (A) 70 - 99 mg/dL Final   Glucose reference range applies only to samples taken after fasting for at least 8 hours.   Glucose-Capillary 09/26/2020 317 (A) 70 - 99 mg/dL Final   Glucose reference range applies only to samples taken after fasting for at least 8 hours.   Glucose-Capillary 09/26/2020 228 (A) 70 - 99 mg/dL Final   Glucose reference range applies only to samples taken after fasting for at least 8 hours.   Glucose-Capillary 09/27/2020 193 (A) 70 - 99 mg/dL Final   Glucose reference range applies only to samples taken after fasting for at least 8 hours.   Glucose-Capillary 09/27/2020 154 (A) 70 - 99 mg/dL Final   Glucose reference range applies only to samples taken after fasting for at least 8 hours.   Glucose-Capillary 09/27/2020 249 (A) 70 - 99 mg/dL Final   Glucose reference range applies only to samples taken after fasting for at least 8 hours.   Glucose-Capillary 09/27/2020 172 (A) 70 - 99 mg/dL Final   Glucose reference range applies only to samples taken after fasting for at least 8 hours.   Comment 1 09/27/2020 Notify RN   Final   Glucose-Capillary 09/28/2020 216 (A) 70 - 99 mg/dL Final   Glucose reference range applies only to samples taken after fasting for at least 8 hours.   Comment 1 09/28/2020 Notify RN   Final   Glucose-Capillary 09/28/2020 207 (A) 70 - 99 mg/dL Final   Glucose reference range applies only to samples taken after fasting for at least 8 hours.   Glucose-Capillary 09/28/2020 164 (A) 70 - 99 mg/dL Final   Glucose reference range applies only to samples taken after fasting for at least 8 hours.   Comment 1 09/28/2020 Notify RN   Final   Glucose-Capillary 09/28/2020 214 (A) 70 - 99 mg/dL Final   Glucose reference range applies only  to samples taken after fasting for at least 8 hours.   Comment 1 09/28/2020 Notify RN   Final   Glucose-Capillary 09/29/2020 162 (A) 70 - 99 mg/dL Final   Glucose reference range applies only to samples taken after fasting for at least 8 hours.   Comment 1 09/29/2020 Notify RN   Final   Glucose-Capillary 09/29/2020 204 (A) 70 - 99 mg/dL Final   Glucose reference range applies  only to samples taken after fasting for at least 8 hours.   Comment 1 09/29/2020 Notify RN   Final  Admission on 09/23/2020, Discharged on 09/24/2020  Component Date Value Ref Range Status   Sodium 09/23/2020 141  135 - 145 mmol/L Final   Potassium 09/23/2020 4.4  3.5 - 5.1 mmol/L Final   Chloride 09/23/2020 104  98 - 111 mmol/L Final   CO2 09/23/2020 30  22 - 32 mmol/L Final   Glucose, Bld 09/23/2020 199 (A) 70 - 99 mg/dL Final   Glucose reference range applies only to samples taken after fasting for at least 8 hours.   BUN 09/23/2020 18  6 - 20 mg/dL Final   Creatinine, Ser 09/23/2020 0.93  0.61 - 1.24 mg/dL Final   Calcium 27/06/2374 9.7  8.9 - 10.3 mg/dL Final   Total Protein 28/31/5176 7.5  6.5 - 8.1 g/dL Final   Albumin 16/07/3708 4.0  3.5 - 5.0 g/dL Final   AST 62/69/4854 23  15 - 41 U/L Final   ALT 09/23/2020 73 (A) 0 - 44 U/L Final   Alkaline Phosphatase 09/23/2020 49  38 - 126 U/L Final   Total Bilirubin 09/23/2020 0.4  0.3 - 1.2 mg/dL Final   GFR, Estimated 09/23/2020 >60  >60 mL/min Final   Comment: (NOTE) Calculated using the CKD-EPI Creatinine Equation (2021)    Anion gap 09/23/2020 7  5 - 15 Final   Performed at [facility], [address] [place], [place] [address]   Alcohol, Ethyl (B) 09/23/2020 <10  <10 mg/dL Final   Comment: (NOTE) Lowest detectable limit for serum alcohol is 10 mg/dL.  For medical purposes only. Performed at [facility], [address] [place], [place] [address]    Opiates 09/23/2020 NONE DETECTED  NONE DETECTED Final   Cocaine  09/23/2020 NONE DETECTED  NONE DETECTED Final   Benzodiazepines 09/23/2020 NONE DETECTED  NONE DETECTED Final   Amphetamines 09/23/2020 NONE DETECTED  NONE DETECTED Final   Tetrahydrocannabinol 09/23/2020 NONE DETECTED  NONE DETECTED Final   Barbiturates 09/23/2020 NONE DETECTED  NONE DETECTED Final   Comment: (NOTE) DRUG SCREEN FOR MEDICAL PURPOSES ONLY.  IF CONFIRMATION IS NEEDED FOR ANY PURPOSE, NOTIFY LAB WITHIN 5 DAYS.  LOWEST DETECTABLE LIMITS FOR URINE DRUG SCREEN Drug Class                     Cutoff (ng/mL) Amphetamine and metabolites    1000 Barbiturate and metabolites    200 Benzodiazepine                 200 Tricyclics and metabolites     300 Opiates and metabolites        300 Cocaine and metabolites        300 THC                            50 Performed at [facility], [address], [place], [place] [address]    WBC 09/23/2020 7.0  4.0 - 10.5 K/uL Final   RBC 09/23/2020 4.32  4.22 - 5.81 MIL/uL Final   Hemoglobin 09/23/2020 13.3  13.0 - 17.0 g/dL Final   HCT 93/71/6967 41.8  39.0 - 52.0 % Final   MCV 09/23/2020 96.8  80.0 - 100.0 fL Final   MCH 09/23/2020 30.8  26.0 - 34.0 pg Final   MCHC 09/23/2020 31.8  30.0 - 36.0 g/dL Final  RDW 09/23/2020 13.7  11.5 - 15.5 % Final   Platelets 09/23/2020 130 (A) 150 - 400 K/uL Final   nRBC 09/23/2020 0.0  0.0 - 0.2 % Final   Performed at [facility], [address] [place], [place] [address]   Acetaminophen (Tylenol), Serum 09/23/2020 <10 (A) 10 - 30 ug/mL Final   Comment: (NOTE) Therapeutic concentrations vary significantly. A range of 10-30 ug/mL  may be an effective concentration for many patients. However, some  are best treated at concentrations outside of this range. Acetaminophen concentrations >150 ug/mL at 4 hours after ingestion  and >50 ug/mL at 12 hours after ingestion are often associated with  toxic reactions.  Performed at [facility], [address] [place], [place] [address]    Troponin I (High Sensitivity) 09/23/2020 3  <18 ng/L Final   Comment: (NOTE) Elevated high sensitivity troponin I (hsTnI) values and significant  changes across serial measurements may suggest ACS but many other  chronic and acute conditions are known to elevate hsTnI results.  Refer to the "Links" section for chest pain algorithms and additional  guidance. Performed at [facility], [address] [place], [place] [address]    SARS Coronavirus 2 by RT PCR 09/23/2020 NEGATIVE  NEGATIVE Final   Comment: (NOTE) SARS-CoV-2 target nucleic acids are NOT DETECTED.  The SARS-CoV-2 RNA is generally detectable in upper respiratory specimens during the acute phase of infection. The lowest concentration of SARS-CoV-2 viral copies this assay can detect is 138 copies/mL. A negative result does not preclude SARS-Cov-2 infection and should not be used as the sole basis for treatment or other patient management decisions. A negative result may occur with  improper specimen collection/handling, submission of specimen other than nasopharyngeal swab, presence of viral mutation(s) within the areas targeted by this assay, and inadequate number of viral copies(<138 copies/mL). A negative result must be combined with clinical observations, patient history, and epidemiological information. The expected result is Negative.  Fact Sheet for Patients:  BloggerCourse.com  Fact Sheet for Healthcare Providers:  SeriousBroker.it  This test is no                          t yet approved or cleared by the [place] FDA and  has been authorized for detection and/or diagnosis of SARS-CoV-2 by FDA under an Emergency Use Authorization (EUA). This EUA will remain  in effect (meaning this test can be used) for the duration of the COVID-19 declaration under Section 564(b)(1) of the Act, 21 U.S.C.section  360bbb-3(b)(1), unless the authorization is terminated  or revoked sooner.       Influenza A by PCR 09/23/2020 NEGATIVE  NEGATIVE Final   Influenza B by PCR 09/23/2020 NEGATIVE  NEGATIVE Final   Comment: (NOTE) The Xpert Xpress SARS-CoV-2/FLU/RSV plus assay is intended as an aid in the diagnosis of influenza from Nasopharyngeal swab specimens and should not be used as a sole basis for treatment. Nasal washings and aspirates are unacceptable for Xpert Xpress SARS-CoV-2/FLU/RSV testing.  Fact Sheet for Patients: BloggerCourse.com  Fact Sheet for Healthcare Providers: SeriousBroker.it  This test is not yet approved or cleared by the [place] FDA and has been authorized for detection and/or diagnosis of SARS-CoV-2 by FDA under an Emergency Use Authorization (EUA). This EUA will remain in effect (meaning this test can be used) for the duration of the COVID-19 declaration under Section 564(b)(1) of the Act, 21 U.S.C. section  360bbb-3(b)(1), unless the authorization is terminated or revoked.  Performed at [facility], [address], [place], [place] [address]   Admission on 08/09/2020, Discharged on 08/18/2020  Component Date Value Ref Range Status   Vitamin B-12 08/11/2020 191  180 - 914 pg/mL Final   Comment: (NOTE) This assay is not validated for testing neonatal or myeloproliferative syndrome specimens for Vitamin B12 levels. Performed at [facility], [address], [place], [place] [address]    Folate 08/11/2020 12.5  >5.9 ng/mL Final   Performed at [facility], [address], [place], [place] [address]   Iron 08/11/2020 75  45 - 182 ug/dL Final   TIBC 91/47/8295 319  250 - 450 ug/dL Final   Saturation Ratios 08/11/2020 24  17.9 - 39.5 % Final   UIBC 08/11/2020 244  ug/dL Final   Performed at [facility], [name] [place],  [place] [address]   Ferritin 08/11/2020 139  24 - 336 ng/mL Final   Performed at [facility], [address], [place], [place] [address]   Retic Ct Pct 08/11/2020 1.5  0.4 - 3.1 % Final   RBC. 08/11/2020 4.25  4.22 - 5.81 MIL/uL Final   Retic Count, Absolute 08/11/2020 64.2  19.0 - 186.0 K/uL Final   Immature Retic Fract 08/11/2020 13.4  2.3 - 15.9 % Final   Performed at [facility], [address], [place], [place] [address]   HIV 1 RNA Quant 08/11/2020 <20  copies/mL Corrected   Comment: (NOTE) HIV-1 RNA not detected The reportable range for this assay is 20 to 10,000,000 copies HIV-1 RNA/mL. Performed At: [facility] [address] [place], [place] [id number] Jolene Schimke MD MW:[phone]    LOG10 HIV-1 RNA 08/11/2020 UNABLE TO CALCULATE  log10copy/mL Final   Comment: (NOTE) Unable to calculate result since non-numeric result obtained for component test.    TSH 08/11/2020 0.337 (A) 0.350 - 4.500 uIU/mL Final   Comment: Performed by a 3rd Generation assay with a functional sensitivity of <=0.01 uIU/mL. Performed at [facility], [address], [place], [place] [address]    TSH 08/13/2020 1.181  0.350 - 4.500 uIU/mL Final   Comment: Performed by a 3rd Generation assay with a functional sensitivity of <=0.01 uIU/mL. Performed at [facility], [address], [place], [place] [address]    Sodium 08/13/2020 139  135 - 145 mmol/L Final   Potassium 08/13/2020 4.2  3.5 - 5.1 mmol/L Final   Chloride 08/13/2020 105  98 - 111 mmol/L Final   CO2 08/13/2020 28  22 - 32 mmol/L Final   Glucose, Bld 08/13/2020 173 (A) 70 - 99 mg/dL Final   Glucose reference range applies only to samples taken after fasting for at least 8 hours.   BUN 08/13/2020 12  6 - 20 mg/dL Final   Creatinine, Ser 08/13/2020 0.78  0.61 - 1.24 mg/dL Final   Calcium 63/87/5643 8.8 (A) 8.9 - 10.3 mg/dL Final   Total Protein 32/95/1884 6.7  6.5  - 8.1 g/dL Final   Albumin 16/60/6301 3.4 (A) 3.5 - 5.0 g/dL Final   AST 60/10/9321 18  15 - 41 U/L Final   ALT 08/13/2020 26  0 - 44 U/L Final   Alkaline Phosphatase 08/13/2020 46  38 - 126 U/L Final   Total Bilirubin 08/13/2020 0.4  0.3 - 1.2 mg/dL Final   GFR, Estimated 08/13/2020 >60  >60 mL/min  Final   Comment: (NOTE) Calculated using the CKD-EPI Creatinine Equation (2021)    Anion gap 08/13/2020 6  5 - 15 Final   Performed at [facility], [address] [place], [place] [address]   Free T4 08/13/2020 0.89  0.61 - 1.12 ng/dL Final   Comment: (NOTE) Biotin ingestion may interfere with free T4 tests. If the results are inconsistent with the TSH level, previous test results, or the clinical presentation, then consider biotin interference. If needed, order repeat testing after stopping biotin. Performed at [facility], [address], [place], [place] [address]    Hgb A1c MFr Bld 08/17/2020 6.9 (A) 4.8 - 5.6 % Final   Comment: (NOTE) Pre diabetes:          5.7%-6.4%  Diabetes:              >6.4%  Glycemic control for   <7.0% adults with diabetes    Mean Plasma Glucose 08/17/2020 151.33  mg/dL Final   Performed at [facility], [address] [place], [place] [address]  Admission on 08/09/2020, Discharged on 08/09/2020  Component Date Value Ref Range Status   Opiates 08/08/2020 NONE DETECTED  NONE DETECTED Final   Cocaine 08/08/2020 NONE DETECTED  NONE DETECTED Final   Benzodiazepines 08/08/2020 NONE DETECTED  NONE DETECTED Final   Amphetamines 08/08/2020 NONE DETECTED  NONE DETECTED Final   Tetrahydrocannabinol 08/08/2020 NONE DETECTED  NONE DETECTED Final   Barbiturates 08/08/2020 NONE DETECTED  NONE DETECTED Final   Comment: (NOTE) DRUG SCREEN FOR MEDICAL PURPOSES ONLY.  IF CONFIRMATION IS NEEDED FOR ANY PURPOSE, NOTIFY LAB WITHIN 5 DAYS.  LOWEST DETECTABLE LIMITS FOR URINE DRUG SCREEN Drug Class                     Cutoff  (ng/mL) Amphetamine and metabolites    1000 Barbiturate and metabolites    200 Benzodiazepine                 200 Tricyclics and metabolites     300 Opiates and metabolites        300 Cocaine and metabolites        300 THC                            50 Performed at [facility], [address], [place], [place] [address]    Acetaminophen (Tylenol), Serum 08/08/2020 <10 (A) 10 - 30 ug/mL Final   Comment: (NOTE) Therapeutic concentrations vary significantly. A range of 10-30 ug/mL  may be an effective concentration for many patients. However, some  are best treated at concentrations outside of this range. Acetaminophen concentrations >150 ug/mL at 4 hours after ingestion  and >50 ug/mL at 12 hours after ingestion are often associated with  toxic reactions.  Performed at [facility], [address], [place], [place] [address]    Salicylate Lvl 08/08/2020 <7.0 (A) 7.0 - 30.0 mg/dL Final   Performed at [facility], [address], [place], [place] [address]   Sodium 08/08/2020 132 (A) 135 - 145 mmol/L Final   Potassium 08/08/2020 3.1 (A) 3.5 - 5.1 mmol/L Final   Chloride 08/08/2020 100  98 - 111 mmol/L Final   CO2 08/08/2020 27  22 - 32 mmol/L Final   Glucose, Bld 08/08/2020 124 (A) 70 - 99 mg/dL Final   Glucose reference range applies only to samples taken after fasting for at  least 8 hours.   BUN 08/08/2020 13  6 - 20 mg/dL Final   Creatinine, Ser 08/08/2020 0.84  0.61 - 1.24 mg/dL Final   Calcium 16/10/9602 8.3 (A) 8.9 - 10.3 mg/dL Final   GFR, Estimated 08/08/2020 >60  >60 mL/min Final   Comment: (NOTE) Calculated using the CKD-EPI Creatinine Equation (2021)    Anion gap 08/08/2020 5  5 - 15 Final   Performed at [facility], [address], [place], [place] [address]   WBC 08/08/2020 6.1  4.0 - 10.5 K/uL Final   RBC 08/08/2020 3.98 (A) 4.22 - 5.81 MIL/uL Final   Hemoglobin 08/08/2020 12.3 (A) 13.0 - 17.0 g/dL Final   HCT 11/91/4782 38.3 (A) 39.0 - 52.0 % Final   MCV 08/08/2020  96.2  80.0 - 100.0 fL Final   MCH 08/08/2020 30.9  26.0 - 34.0 pg Final   MCHC 08/08/2020 32.1  30.0 - 36.0 g/dL Final   RDW 95/62/1308 13.3  11.5 - 15.5 % Final   Platelets 08/08/2020 127 (A) 150 - 400 K/uL Final   nRBC 08/08/2020 0.0  0.0 - 0.2 % Final   Neutrophils Relative % 08/08/2020 45  % Final   Neutro Abs 08/08/2020 2.8  1.7 - 7.7 K/uL Final   Lymphocytes Relative 08/08/2020 40  % Final   Lymphs Abs 08/08/2020 2.5  0.7 - 4.0 K/uL Final   Monocytes Relative 08/08/2020 10  % Final   Monocytes Absolute 08/08/2020 0.6  0.1 - 1.0 K/uL Final   Eosinophils Relative 08/08/2020 5  % Final   Eosinophils Absolute 08/08/2020 0.3  0.0 - 0.5 K/uL Final   Basophils Relative 08/08/2020 0  % Final   Basophils Absolute 08/08/2020 0.0  0.0 - 0.1 K/uL Final   Immature Granulocytes 08/08/2020 0  % Final   Abs Immature Granulocytes 08/08/2020 0.02  0.00 - 0.07 K/uL Final   Performed at [facility], [address], [place], [place] [address]   SARS Coronavirus 2 by RT PCR 08/09/2020 NEGATIVE  NEGATIVE Final   Comment: (NOTE) SARS-CoV-2 target nucleic acids are NOT DETECTED.  The SARS-CoV-2 RNA is generally detectable in upper respiratory specimens during the acute phase of infection. The lowest concentration of SARS-CoV-2 viral copies this assay can detect is 138 copies/mL. A negative result does not preclude SARS-Cov-2 infection and should not be used as the sole basis for treatment or other patient management decisions. A negative result may occur with  improper specimen collection/handling, submission of specimen other than nasopharyngeal swab, presence of viral mutation(s) within the areas targeted by this assay, and inadequate number of viral copies(<138 copies/mL). A negative result must be combined with clinical observations, patient history, and epidemiological information. The expected result is Negative.  Fact Sheet for Patients:  BloggerCourse.com  Fact  Sheet for Healthcare Providers:  SeriousBroker.it  This test is no                          t yet approved or cleared by the [place] FDA and  has been authorized for detection and/or diagnosis of SARS-CoV-2 by FDA under an Emergency Use Authorization (EUA). This EUA will remain  in effect (meaning this test can be used) for the duration of the COVID-19 declaration under Section 564(b)(1) of the Act, 21 U.S.C.section 360bbb-3(b)(1), unless the authorization is terminated  or revoked sooner.       Influenza A by PCR 08/09/2020 NEGATIVE  NEGATIVE Final   Influenza B  by PCR 08/09/2020 NEGATIVE  NEGATIVE Final   Comment: (NOTE) The Xpert Xpress SARS-CoV-2/FLU/RSV plus assay is intended as an aid in the diagnosis of influenza from Nasopharyngeal swab specimens and should not be used as a sole basis for treatment. Nasal washings and aspirates are unacceptable for Xpert Xpress SARS-CoV-2/FLU/RSV testing.  Fact Sheet for Patients: BloggerCourse.com  Fact Sheet for Healthcare Providers: SeriousBroker.it  This test is not yet approved or cleared by the [place] FDA and has been authorized for detection and/or diagnosis of SARS-CoV-2 by FDA under an Emergency Use Authorization (EUA). This EUA will remain in effect (meaning this test can be used) for the duration of the COVID-19 declaration under Section 564(b)(1) of the Act, 21 U.S.C. section 360bbb-3(b)(1), unless the authorization is terminated or revoked.  Performed at [facility], [address] [place], [place] [address]     Allergies: Patient has no known allergies.  PTA Medications: (Not in a hospital admission)   Medical Decision Making  Patient will be placed in continuous assessment area for treatment and stabilization.  He will be reassessed on 10/09/2020, disposition will be determined at that time.  Patient remains voluntary. Urine  drug screen results negative.  Laboratory studies reviewed and unremarkable including CMP, CBC, lipid profile, TSH and EtOH. Current medications: -Acetaminophen 650 mg every 6 as needed/mouth pain -Maalox 30 mL oral every 4 as needed/digestion -Gabapentin 300 mg 3 times daily -Hydroxyzine 25 mg 3 times daily as needed/anxiety -Magnesium hydroxide 30 mL daily as needed/mild constipation -Metformin XR 500 mg twice daily with meals -Nicotine 21 mg transdermal daily -Sertraline 50 mg nightly -Trazodone 50 mg nightly as needed/sleep     Recommendations  Based on my evaluation the patient does not appear to have an emergency medical condition. Patient reviewed with Dr. Nelly Rout.  Lenard Lance, FNP 10/08/20  5:50 PM

## 2020-10-08 NOTE — ED Triage Notes (Signed)
Pt reports blisters on his tongue, hernia pain, ans SI/HI. Hx of attempt by overdose 3 months ago.
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
## 2020-10-08 NOTE — ED Provider Notes (Signed)
[facility]-EMERGENCY DEPT Provider Note   CSN: [id number] Arrival date & time: 10/08/20  0745     History Chief Complaint  Patient presents with   Hernia   blisters on tongue   Suicidal    Joshua Thomas is a 45 y.o. male.  HPI Patient is here for help.  He states he is homeless and is back to using opiates.  He does not have any place to stay.  He is trying to get into outpatient rehab.  He was recently discharged from the behavioral hospital, 9 days ago.  He states "I was discharged too soon."  He had wanted to leave from there and go directly to rehab.  He states he has had some homicidal thoughts against people who "are in stores that tell me to leave."  He has also had some thoughts of suicide, without distinct plan.  He is concerned because he only has the close he is wearing and is cold at night.  He also complains of pain in his right groin where he has a reducible hernia.  He has noticed some ulcers on his tongue as well recently.  He denies fever, productive cough, shortness of breath, nausea or vomiting.  There are no other known active modifying factors.    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   COPD (chronic obstructive pulmonary disease) (HCC)    DDD (degenerative disc disease), lumbar    Degenerative disc disease, lumbar    Depression    Discitis    GERD (gastroesophageal reflux disease)    Hepatitis C    Hepatitis C    History of stomach ulcers    Hx of diabetes mellitus    due to infection   Hypertension    Lung nodules    3 on L and 2 on R   PTSD (post-traumatic stress disorder)    Sciatica    Substance abuse [facility])     Patient Active Problem List   Diagnosis Date Noted   MDD (major depressive disorder), recurrent severe, without  psychosis (HCC) 08/12/2020   Opioid abuse (HCC) 08/12/2020   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   Essential hypertension 09/23/2017   Hyperglycemia 06/03/2017   GERD (gastroesophageal reflux disease) 05/17/2017   Bipolar disorder (HCC) 05/17/2017   Substance induced mood disorder (HCC) 05/21/2015   Chronic hepatitis C virus genotype 1a infection (HCC)     Past Surgical History:  Procedure Laterality Date   FRACTURE SURGERY     HARDWARE REMOVAL  01/27/2012   Procedure: HARDWARE REMOVAL;  Surgeon: Kathryne Hitch, MD;  Location: [facility] ORS;  Service: Orthopedics;  Laterality: Left;   IR LUMBAR DISC ASPIRATION W/IMG GUIDE  06/08/2017   RADIOLOGY WITH ANESTHESIA N/A 06/08/2017   Procedure: DISC ASPIRATION;  Surgeon: Julieanne Cotton, MD;  Location: [facility] OR;  Service: Radiology;  Laterality: N/A;   TIBIA IM NAIL INSERTION  01/27/2012   Procedure: INTRAMEDULLARY (IM) NAIL TIBIAL;  Surgeon: Kathryne Hitch, MD;  Location: [facility]S;  Service: Orthopedics;  Laterality: Left;  Removal of IM Rod and Screws Left Tibia with Exchange Nail, Allograft Bone Graft Left Tibia       Family History  Problem Relation Age of Onset   Diabetes Mother  Alcohol abuse Father    Cirrhosis Father    Aneurysm Father     Social History   Tobacco Use   Smoking status: Every Day    Packs/day: 1.00    Years: 33.00    Pack years: 33.00    Types: Cigarettes   Smokeless tobacco: Former    Quit date: 08/08/2006   Tobacco comments:    1 PPD  Substance Use Topics   Alcohol use: Yes    Alcohol/week: 1.0 standard drink    Types: 1 Standard drinks or equivalent per week    Comment: occasionally   Drug use: Yes    Frequency: 7.0 times per week    Types: IV, Methamphetamines, Heroin    Comment: heroin daily, meth daily    Home Medications Prior to Admission medications   Medication Sig Start Date End Date Taking? Authorizing Provider  gabapentin (NEURONTIN) 300 MG capsule Take 1 capsule (300 mg  total) by mouth 3 (three) times daily. Patient not taking: Reported on 10/08/2020 09/29/20 10/29/20  Laveda Abbe, NP  metFORMIN (GLUCOPHAGE-XR) 500 MG 24 hr tablet Take 1 tablet (500 mg total) by mouth 2 (two) times daily with a meal. Patient not taking: Reported on 10/08/2020 09/29/20   Laveda Abbe, NP  naloxone [facility]) nasal spray 4 mg/0.1 mL Take in case of overdose Patient not taking: Reported on 10/08/2020 08/07/20   Long, Arlyss Repress, MD  nicotine (NICODERM CQ - DOSED IN MG/24 HOURS) 21 mg/24hr patch Place 1 patch (21 mg total) onto the skin daily at 6 (six) AM. Patient not taking: Reported on 10/08/2020 09/30/20   Laveda Abbe, NP  sertraline (ZOLOFT) 50 MG tablet Take 1 tablet (50 mg total) by mouth at bedtime. Patient not taking: Reported on 10/08/2020 09/29/20   Laveda Abbe, NP  traZODone (DESYREL) 50 MG tablet Take 1 tablet (50 mg total) by mouth at bedtime as needed for sleep. Patient not taking: Reported on 10/08/2020 09/29/20   Laveda Abbe, NP    Allergies    Patient has no known allergies.  Review of Systems   Review of Systems  All other systems reviewed and are negative.  Physical Exam Updated Vital Signs BP 129/87   Pulse 61   Temp (!) 97.5 F (36.4 C) (Oral)   Resp 16   SpO2 97%   Physical Exam Vitals and nursing note reviewed.  Constitutional:      General: He is not in acute distress.    Appearance: He is well-developed. He is not ill-appearing, toxic-appearing or diaphoretic.  HENT:     Head: Normocephalic and atraumatic.     Right Ear: External ear normal.     Left Ear: External ear normal.     Mouth/Throat:     Comments: Few superficial ulcers on tongue, none specific appearance.  No associated bleeding, drainage or trismus. Eyes:     Conjunctiva/sclera: Conjunctivae normal.     Pupils: Pupils are equal, round, and reactive to light.  Neck:     Trachea: Phonation normal.  Cardiovascular:     Rate and Rhythm:  Normal rate and regular rhythm.  Pulmonary:     Effort: Pulmonary effort is normal.  Abdominal:     General: There is no distension.     Palpations: Abdomen is soft.     Tenderness: There is no abdominal tenderness.     Comments: Reducible right groin hernia.  Musculoskeletal:        General: Normal range of  motion.     Cervical back: Normal range of motion and neck supple.  Skin:    General: Skin is warm and dry.  Neurological:     Mental Status: He is alert and oriented to person, place, and time.     Cranial Nerves: No cranial nerve deficit.     Sensory: No sensory deficit.     Motor: No abnormal muscle tone.     Coordination: Coordination normal.  Psychiatric:        Mood and Affect: Mood normal.        Behavior: Behavior normal.        Thought Content: Thought content normal.        Judgment: Judgment normal.    ED Results / Procedures / Treatments   Labs (all labs ordered are listed, but only abnormal results are displayed) Labs Reviewed  RESP PANEL BY RT-PCR (FLU A&B, COVID) ARPGX2  RAPID URINE DRUG SCREEN, HOSP PERFORMED  COMPREHENSIVE METABOLIC PANEL  CBC WITH DIFFERENTIAL/PLATELET  ETHANOL  TSH  HEMOGLOBIN A1C  LIPID PANEL    EKG None  Radiology No results found.  Procedures Procedures   Medications Ordered in ED Medications - No data to display  ED Course  I have reviewed the triage vital signs and the nursing notes.  Pertinent labs & imaging results that were available during my care of the patient were reviewed by me and considered in my medical decision making (see chart for details).  Clinical Course as of 10/08/20 1516  Wed Oct 08, 2020  1232 I had a discussion with TTS counselor, who states that the patient may possibly be able to be admitted at the [facility] crisis inpatient unit.  He is requesting that labs be done, in preparation for that transfer. [EW]    Clinical Course User Index [EW] Mancel Bale, MD   MDM  Rules/Calculators/A&P                            Patient Vitals for the past 24 hrs:  BP Temp Temp src Pulse Resp SpO2  10/08/20 1206 129/87 -- -- 61 16 97 %  10/08/20 1017 138/90 -- -- (!) 59 18 100 %  10/08/20 0754 (!) 166/107 (!) 97.5 F (36.4 C) Oral 63 18 100 %    3:13 PM Reevaluation with update and discussion. After initial assessment and treatment, an updated evaluation reveals he remains comfortable, and is agreeable to placement.Mancel Bale   Medical Decision Making:  This patient is presenting for evaluation of homelessness with secondary hopelessness related to his thoughts of suicidal ideation and homicidal ideation, which does require a range of treatment options, and is a complaint that involves a moderate risk of morbidity and mortality. The differential diagnoses include homelessness, polysubstance abuse, acute psychiatric disorder. I decided to review old records, and in summary patient is homeless, was recently hospitalized for prolonged period time, here seeking rehab.  He has ongoing homicidal and suicidal ideations.  Most of these seem to be related to his homelessness.  I did not require additional historical information from anyone.  Clinical Laboratory Tests Ordered, included CBC, Metabolic panel, and urine drug screen, TSH, lipid panel, hemoglobin A1c, alcohol level, respiratory panel .      Critical Interventions-clinical evaluation, laboratory testing, TTS consultation  After These Interventions, the Patient was reevaluated and was found with worrisome symptoms for worsening suicidal ideation.  TTS consultation recommended hospitalization in psychiatric facility and  wanted additional testing done prior to that.  CRITICAL CARE-no Performed by: Mancel Bale  Nursing Notes Reviewed/ Care Coordinated Applicable Imaging Reviewed Interpretation of Laboratory Data incorporated into ED treatment  Plan-disposition as per TTS in conjunction with oncoming  provider team    Final Clinical Impression(s) / ED Diagnoses Final diagnoses:  Suicidal ideation  Polysubstance abuse [facility])  Homelessness    Rx / DC Orders ED Discharge Orders     None        Mancel Bale, MD 10/08/20 1517

## 2020-10-08 NOTE — BH Assessment (Signed)
[facility] Assessment Progress Note   Per Dorena Bodo, NP this voluntary pt would benefit from admission to [facility].  Earlene Plater, MD has agreed to accept pt contingent upon Covid-19 resulting negative, and all necessary labs being drawn.  Labs have been drawn, but Covid result is pending as of this writing.  If Covid results negative, please have pt sign Voluntary Admission and Consent for Treatment, fax it to [phone], call nursing report to [phone] and send pt via Safe Transport. EDP Gwyneth Sprout, MD and pt's nurse, Tiffany,  have been notified.  Doylene Canning, [place] Behavioral Health Coordinator [phone]

## 2020-10-08 NOTE — ED Notes (Signed)
Pt awoken from nap, asked for graham crackers and a soda. This Clinical research associate provided pt one drink and one graham cracker. Pt walked to the BR. Back on stretcher. Will continue to monitor pt.

## 2020-10-08 NOTE — ED Notes (Signed)
Pt in room 25 tts

## 2020-10-08 NOTE — ED Notes (Signed)
Pt ambulatory in triage. 

## 2020-10-08 NOTE — ED Notes (Signed)
Pt given sandwich, crackers and soda

## 2020-10-08 NOTE — Progress Notes (Signed)
Pt is a transfer from [facility] and is admitted to continuous OBS due to passive SI. Pt verbally contracts for safety on the unit. Pt is alert and oriented.. Pt is ambulatory and is oriented to staff and unit. Scattered bruises/scabs were noted all over pt's body. Per pt, these are as a result of him walking through the woods. Pt denies current HI/AVH. Staff will monitor for pt's safety.

## 2020-10-09 LAB — HEMOGLOBIN A1C
Hgb A1c MFr Bld: 6.9 % — ABNORMAL HIGH (ref 4.8–5.6)
Mean Plasma Glucose: 151 mg/dL

## 2020-10-09 NOTE — Progress Notes (Signed)
Pt is presently asleep. Respirations are even and unlabored. No signs of acute distress noted. Pt endorses passive SI. Pt verbally contracts for safety on unit. Pt denies current HI/AVH. Staff will monitor for pt's safety.

## 2020-10-09 NOTE — Discharge Instructions (Signed)
Patient is instructed prior to discharge to:  Take all medications as prescribed by his/her mental healthcare provider. Report any adverse effects and or reactions from the medicines to his/her outpatient provider promptly. Keep all scheduled appointments, to ensure that you are getting refills on time and to avoid any interruption in your medication.  If you are unable to keep an appointment call to reschedule.  Be sure to follow-up with resources and follow-up appointments provided.  Patient has been instructed & cautioned: To not engage in alcohol and or illegal drug use while on prescription medicines. In the event of worsening symptoms, patient is instructed to call the crisis hotline, 911 and or go to the nearest ED for appropriate evaluation and treatment of symptoms. To follow-up with his/her primary care provider for your other medical issues, concerns and or health care needs.    Substance Abuse Treatment Resources listed Below:  [facility] - Admissions are currently completed Monday through Friday at 8am; both appointments and walk-ins are accepted.  Any individual that is a [place] resident may present for a substance abuse screening and assessment for admission.  A person may be referred by numerous sources or self-refer.   Potential clients will be screened for medical necessity and appropriateness for the program.  Clients must meet criteria for high-intensity residential treatment services.  If clinically appropriate, a client will continue with the comprehensive clinical assessment and intake process, as well as enrollment in the [facility].  Address: [address] [place], [place] [address] Admin Hours: Mon-Fri 8AM to 5PM Center Hours: 24/7 Phone: [phone] Fax: [phone]  [facility] Address: [address], [place], [place] [address] Behavioral Health Urgent Care ([facility]) Hours: 24/7 Phone:  [phone] Fax: [phone]  [facility]): (offers outpatient therapy and intensive outpatient substance abuse therapy).  [address], [place], [place] [address] Phone: [phone]  [facility]: Offers FREE recovery skills classes, support groups, 1:1 Peer Support, and Compeer Classes. [address], [place], [place] [address] Phone: [phone] (Call to complete intake).   [facility] [address] [place], [place] [address] Phone: [phone] ext [phone] [facility] provides food, shelter and other programs and services to the homeless men of [place] through our men's program.  By offering safe shelter, three meals a day, clean clothing, Biblical counseling, financial planning, vocational training, GED/education and employment assistance, we've helped mend the shattered lives of many homeless men since opening in 1974.  We have approximately 267 beds available, with a max of 312 beds including mats for emergency situations and currently house an average of 270 men a night.  Prospective Client Check-In Information Photo ID Required (State/ Out of State/ DOC) - if photo ID is not available, clients are required to have a printout of a police/sheriff's criminal history report. Help out with chores around the Mission. No sex offender of any type (pending, charged, registered and/or any other sex related offenses) will be permitted to check in. Must be willing to abide by all rules, regulations, and policies established by the  [facility]. The following will be provided - shelter, food, clothing, and biblical counseling. If you or someone you know is in need of assistance at our men's shelter in , [place], please call [phone] ext. [phone].  [facility]-will provide timely access to mental health services for children and adolescents (4-17) and adults    presenting in a mental health crisis. The program is designed for those who need urgent Behavioral Health or Substance Use treatment and are not experiencing a medical crisis that would typically require an emergency room visit.    [address] [place], [place] [address] Phone: [phone] [email or web address]  [facility]: Phone#: [phone]  The [facility] SA Intensive Outpatient Program (SAIOP) means structured individual and group addiction activities and services that are provided at an outpatient program designed to assist adult and adolescent consumers to begin recovery and learn skills for recovery maintenance. The [facility] SAIOP program is offered at least 3 hours a day, 3 days a week.SAIOP services shall include a structured program consisting of, but not limited to, the following services: Individual counseling and support; Group counseling and support; Family counseling, training or support; Biochemical assays to identify recent drug use (e.g., urine drug screens); Strategies for relapse prevention to include community and social support systems in treatment; Life skills; Crisis contingency planning; Disease Management; and Treatment support activities that have been adapted or specifically designed for persons with physical disabilities, or persons with co-occurring disorders of mental illness and substance abuse/dependence or mental retardation/developmental disability and substance abuse/dependence. Phone: [phone]  Address:   The [place] [facility] will also offer the following outpatient services: (Monday through Friday 8am-5pm)   Partial Hospitalization Program (PHP) Substance Abuse Intensive Outpatient Program (SA-IOP) Group Therapy Medication Management Peer Living Room We also provide (24/7):  Assessments: Our mental health clinician and providers will conduct a focused mental health evaluation, assessing for immediate  safety concerns and further mental health needs. Referral: Our team will provide resources and help connect to community based mental health treatment, when indicated, including psychotherapy, psychiatry, and other specialized behavioral health or substance use disorder services (for those not already in treatment). Transitional Care: Our team providers in person bridging and/or telephonic follow-up during the patient's transition to outpatient services.  [facility] 24-Hour Call Center: [phone] Behavioral Health Crisis Line: [phone]  

## 2020-10-09 NOTE — ED Provider Notes (Signed)
FBC/OBS ASAP Discharge Summary  Date and Time: 10/09/2020 10:28 AM  Name: Joshua Thomas  MRN:  [id number]   Discharge Diagnoses:  Final diagnoses:  Substance induced mood disorder (HCC)  Amphetamine abuse (HCC)  Opioid abuse (HCC)    Subjective: Patient states "I would like to get into residential substance use treatment, I understand that I should call early."  Recent stressors include a relapse on substance after a recent discharge from [facility] behavioral health.  Joshua Thomas has some concern that if he is not admitted directly to substance use treatment he may again relapse on methamphetamine and heroin.  Patient is assessed face-to-face by nurse practitioner.  He is seated in assessment area, no acute distress.  He is alert and oriented, pleasant and cooperative during assessment.  He endorses euthymic mood with congruent affect. He denies suicidal and homicidal ideations.  He denies any history of self-harm.  He contracts verbally for safety with this Clinical research associate.   He has normal speech and behavior.  He denies both auditory and visual hallucinations.  Patient is able to converse coherently with goal-directed thoughts and no distractibility or preoccupation.  He denies paranoia.  Objectively there is no evidence of psychosis/mania or delusional thinking.  He is currently homeless in [place], he is supported by his mother who lives nearby.  He denies access to weapons.  He is not currently employed, would like to seek substance use treatment did not seek employment.  He endorses average sleep and appetite.  Patient offered support and encouragement.  He declines any contact for collateral information.  Stay Summary: HPI from 10/08/2020-1749pm: Joshua Thomas is a 45 year old male, direct admit to continuous observation from [facility] emergency department.   He reports recent stressors including relapse on substances including heroin and methamphetamine.  He reports last use of heroin on yesterday.  He  reports last use of methamphetamine approximately 2 weeks ago.  He reports readiness to stop substance use.  He states "I need rehab, [facility] in [place] would not let me in because of my blood sugar."   Karlis was recently discharged from [facility] behavioral health on 09/29/2020.  He reports he did not follow-up with outpatient psychiatry and did not continue medications once discharged.  He reports he immediately really relapsed on substance because he "gave up" after not being admitted to [facility] in [place].  He would like to restart medications at this time.   Joshua Thomas has been diagnosed with major depressive disorder, opioid abuse, amphetamine abuse, substance-induced mood disorder and bipolar disorder.  He is not currently linked with outpatient psychiatry follow-up.   Patient is assessed face-to-face by nurse practitioner. He is seated in assessment area, no acute distress.  He is alert and oriented, pleasant and cooperative during assessment.  He reports depressed mood with congruent affect. He denies suicidal and homicidal ideations.  He contracts verbally for safety with this Clinical research associate.  He has normal speech and behavior.  He denies both auditory and visual hallucinations.  Patient is able to converse coherently with goal-directed thoughts and no distractibility or preoccupation.  He denies paranoia.  Objectively there is no evidence of psychosis/mania or delusional thinking.   Joshua Thomas is currently homeless in [place].  He denies access to weapons.  He is currently not employed.  He endorses decreased sleep and average appetite.  He denies substance use aside from methamphetamine and heroin.   Patient offered support and encouragement.  He indicates he is interested in "[facility]" program in [place], reports he has heard  it is "wonderful."  Additionally he is interested in [place] [facility].    Patient offered support and encouragement.   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   COPD (chronic obstructive pulmonary disease) (HCC)    DDD (degenerative disc disease), lumbar    Degenerative disc disease, lumbar    Depression    Discitis    GERD (gastroesophageal reflux disease)    Hepatitis C    Hepatitis C    History of stomach ulcers    Hx of diabetes mellitus    due to infection   Hypertension    Lung nodules    3 on L and 2 on R   PTSD (post-traumatic stress disorder)    Sciatica    Substance abuse (HCC)     Past Surgical History:  Procedure Laterality Date   FRACTURE SURGERY     HARDWARE REMOVAL  01/27/2012   Procedure: HARDWARE REMOVAL;  Surgeon: Kathryne Hitch, MD;  Location: [facility] ORS;  Service: Orthopedics;  Laterality: Left;   IR LUMBAR DISC ASPIRATION W/IMG GUIDE  06/08/2017   RADIOLOGY WITH ANESTHESIA N/A 06/08/2017   Procedure: DISC ASPIRATION;  Surgeon: Julieanne Cotton, MD;  Location: [facility] OR;  Service: Radiology;  Laterality: N/A;   TIBIA IM NAIL INSERTION  01/27/2012   Procedure: INTRAMEDULLARY (IM) NAIL TIBIAL;  Surgeon: Kathryne Hitch, MD;  Location: [facility] ORS;  Service: Orthopedics;  Laterality: Left;  Removal of IM Rod and Screws Left Tibia with Exchange Nail, Allograft Bone Graft Left Tibia   Family History:  Family History  Problem Relation Age of Onset   Diabetes Mother    Alcohol abuse Father    Cirrhosis Father    Aneurysm Father    Family Psychiatric History: none reported Social History:  Social History   Substance and Sexual Activity  Alcohol Use Yes   Alcohol/week: 1.0 standard drink   Types: 1 Standard drinks or  equivalent per week   Comment: occasionally     Social History   Substance and Sexual Activity  Drug Use Yes   Frequency: 7.0 times per week   Types: IV, Methamphetamines, Heroin   Comment: heroin daily, meth daily    Social History   Socioeconomic History   Marital status: Single    Spouse name: Not on file   Number of children: Not on file   Years of education: 12   Highest education level: High school graduate  Occupational History   Not on file  Tobacco Use   Smoking status: Every Day    Packs/day: 1.00    Years: 33.00    Pack years: 33.00    Types: Cigarettes   Smokeless tobacco: Former    Quit date: 08/08/2006   Tobacco comments:    1 PPD  Substance and Sexual Activity   Alcohol use: Yes    Alcohol/week: 1.0 standard drink    Types: 1 Standard drinks or equivalent per week    Comment: occasionally   Drug use: Yes    Frequency: 7.0 times per week    Types: IV, Methamphetamines, Heroin  Comment: heroin daily, meth daily   Sexual activity: Not Currently  Other Topics Concern   Not on file  Social History Narrative   Not on file   Social Determinants of Health   Financial Resource Strain: Not on file  Food Insecurity: Not on file  Transportation Needs: Not on file  Physical Activity: Not on file  Stress: Not on file  Social Connections: Not on file   SDOH:  SDOH Screenings   Alcohol Screen: Low Risk    Last Alcohol Screening Score (AUDIT): 1  Depression (PHQ2-9): Not on file  Financial Resource Strain: Not on file  Food Insecurity: Not on file  Housing: Not on file  Physical Activity: Not on file  Social Connections: Not on file  Stress: Not on file  Tobacco Use: High Risk   Smoking Tobacco Use: Every Day   Smokeless Tobacco Use: Former  English as a second language teacher Needs: Not on file    Tobacco Cessation:  A prescription for an FDA-approved tobacco cessation medication was offered at discharge and the patient refused  Current Medications:  Current  Facility-Administered Medications  Medication Dose Route Frequency Provider Last Rate Last Admin   acetaminophen (TYLENOL) tablet 650 mg  650 mg Oral Q6H PRN Lenard Lance, FNP       alum & mag hydroxide-simeth (MAALOX/MYLANTA) 200-200-20 MG/5ML suspension 30 mL  30 mL Oral Q4H PRN Lenard Lance, FNP       gabapentin (NEURONTIN) capsule 300 mg  300 mg Oral TID Lenard Lance, FNP   300 mg at 10/09/20 0942   hydrOXYzine (ATARAX/VISTARIL) tablet 25 mg  25 mg Oral TID PRN Lenard Lance, FNP   25 mg at 10/08/20 2205   magnesium hydroxide (MILK OF MAGNESIA) suspension 30 mL  30 mL Oral Daily PRN Lenard Lance, FNP       metFORMIN (GLUCOPHAGE-XR) 24 hr tablet 500 mg  500 mg Oral BID WC Doran Heater L, FNP   500 mg at 10/09/20 0942   nicotine (NICODERM CQ - dosed in mg/24 hours) patch 21 mg  21 mg Transdermal Q0600 Lenard Lance, FNP       sertraline (ZOLOFT) tablet 50 mg  50 mg Oral QHS Lenard Lance, FNP   50 mg at 10/08/20 2205   traZODone (DESYREL) tablet 50 mg  50 mg Oral QHS PRN Lenard Lance, FNP   50 mg at 10/08/20 2205   Current Outpatient Medications  Medication Sig Dispense Refill   gabapentin (NEURONTIN) 300 MG capsule Take 1 capsule (300 mg total) by mouth 3 (three) times daily. (Patient not taking: Reported on 10/08/2020) 90 capsule 0   metFORMIN (GLUCOPHAGE-XR) 500 MG 24 hr tablet Take 1 tablet (500 mg total) by mouth 2 (two) times daily with a meal. (Patient not taking: Reported on 10/08/2020) 60 tablet 0   naloxone (NARCAN) nasal spray 4 mg/0.1 mL Take in case of overdose (Patient not taking: Reported on 10/08/2020) 1 each 0   nicotine (NICODERM CQ - DOSED IN MG/24 HOURS) 21 mg/24hr patch Place 1 patch (21 mg total) onto the skin daily at 6 (six) AM. (Patient not taking: Reported on 10/08/2020) 28 patch 0   sertraline (ZOLOFT) 50 MG tablet Take 1 tablet (50 mg total) by mouth at bedtime. (Patient not taking: Reported on 10/08/2020) 30 tablet 0   traZODone (DESYREL) 50 MG tablet Take 1 tablet  (50 mg total) by mouth at bedtime as needed for sleep. (Patient not taking: Reported on 10/08/2020) 30  tablet 0    PTA Medications: (Not in a hospital admission)   Musculoskeletal  Strength & Muscle Tone: within normal limits Gait & Station: normal Patient leans: N/A  Psychiatric Specialty Exam  Presentation  General Appearance: Appropriate for Environment; Casual  Eye Contact:Good  Speech:Clear and Coherent; Normal Rate  Speech Volume:Normal  Handedness:Right   Mood and Affect  Mood:Euthymic  Affect:Congruent; Appropriate   Thought Process  Thought Processes:Coherent; Goal Directed; Linear  Descriptions of Associations:Intact  Orientation:Full (Time, Place and Person)  Thought Content:Logical; WDL  Diagnosis of Schizophrenia or Schizoaffective disorder in past: No    Hallucinations:Hallucinations: None  Ideas of Reference:None  Suicidal Thoughts:Suicidal Thoughts: No  Homicidal Thoughts:Homicidal Thoughts: No   Sensorium  Memory:Immediate Good; Recent Good; Remote Good  Judgment:Fair  Insight:Fair   Executive Functions  Concentration:Good  Attention Span:Good  Recall:Good  Fund of Knowledge:Good  Language:Good   Psychomotor Activity  Psychomotor Activity:Psychomotor Activity: Normal   Assets  Assets:Communication Skills; Desire for Improvement; Intimacy; Leisure Time; Physical Health; Resilience; Social Support   Sleep  Sleep:Sleep: Fair   Nutritional Assessment (For OBS and FBC admissions only) Has the patient had a weight loss or gain of 10 pounds or more in the last 3 months?: No Has the patient had a decrease in food intake/or appetite?: No Does the patient have dental problems?: No Does the patient have eating habits or behaviors that may be indicators of an eating disorder including binging or inducing vomiting?: No Has the patient recently lost weight without trying?: 0 Has the patient been eating poorly because of a  decreased appetite?: 0 Malnutrition Screening Tool Score: 0    Physical Exam  Physical Exam Vitals and nursing note reviewed.  Constitutional:      Appearance: Normal appearance. He is well-developed and normal weight.  HENT:     Head: Normocephalic and atraumatic.     Nose: Nose normal.  Cardiovascular:     Rate and Rhythm: Normal rate.  Pulmonary:     Effort: Pulmonary effort is normal.  Musculoskeletal:        General: Normal range of motion.     Cervical back: Normal range of motion.  Skin:    General: Skin is warm and dry.  Neurological:     Mental Status: He is alert and oriented to person, place, and time.  Psychiatric:        Attention and Perception: Attention and perception normal.        Mood and Affect: Mood and affect normal.        Speech: Speech normal.        Behavior: Behavior normal. Behavior is cooperative.        Thought Content: Thought content normal.        Cognition and Memory: Cognition and memory normal.        Judgment: Judgment normal.   Review of Systems  Constitutional: Negative.   HENT: Negative.    Eyes: Negative.   Respiratory: Negative.    Cardiovascular: Negative.   Gastrointestinal: Negative.   Genitourinary: Negative.   Musculoskeletal: Negative.   Skin: Negative.   Neurological: Negative.   Endo/Heme/Allergies: Negative.   Psychiatric/Behavioral:  Positive for substance abuse.   Blood pressure (!) 152/97, pulse 75, temperature 98 F (36.7 C), resp. rate 18, SpO2 100 %. There is no height or weight on file to calculate BMI.  Demographic Factors:  Male and Caucasian  Loss Factors: NA  Historical Factors: NA  Risk Reduction Factors:   Positive  social support, Positive therapeutic relationship, and Positive coping skills or problem solving skills  Continued Clinical Symptoms:  Alcohol/Substance Abuse/Dependencies  Cognitive Features That Contribute To Risk:  None    Suicide Risk:  Minimal: No identifiable suicidal  ideation.  Patients presenting with no risk factors but with morbid ruminations; may be classified as minimal risk based on the severity of the depressive symptoms  Plan Of Care/Follow-up recommendations:  Patient reviewed with Dr. Nelly Rout. Laboratory studies reviewed including CMP, lipid profile, CBC, A1c and TSH.  Urine drug screen negative. Follow-up with outpatient psychiatry, resources provided. Follow-up with substance use treatment resources provided. Follow-up with housing resources provided.  Disposition: Discharge Patient discharged to home with his mother, transported by safe transport.  Lenard Lance, FNP 10/09/2020, 10:28 AM

## 2020-10-09 NOTE — ED Notes (Signed)
Pt sleeping; no distress noted

## 2020-10-09 NOTE — Discharge Summary (Signed)
Joshua Thomas to be D/C'd Home per FNP order. Discussed with the patient and all questions fully answered. An After Visit Summary was printed and given to the patient. Patient escorted out and D/C home via private auto.  Dickie La  10/09/2020 12:23 PM

## 2020-10-09 NOTE — Progress Notes (Addendum)
CSW met with patient at bedside to discuss discharge and aftercare plan. Discussed options and process for residential substance use treatment. Patient expressed interest in [facility] in [place]. Referral was sent to facility for admission consideration with written consent of patient. Patient instructed to call facility after discharge to complete phone interview and monitor bed availability. Patient acknowledged plan. Medical provider has been notified.   Signed:  Durenda Hurt, MSW, [place], LCASA 10/09/2020 11:52 AM

## 2020-10-16 ENCOUNTER — Telehealth (HOSPITAL_COMMUNITY): Payer: Self-pay

## 2020-10-16 NOTE — BH Assessment (Signed)
   Care Management - Follow Up [facility] Discharges   Writer attempted to make contact with patient today and was unsuccessful.  Writer left a able HIPPA compliant voice message.    Per chart review, patient was referred to the [facility] substance abuse facility.

## 2020-10-30 ENCOUNTER — Emergency Department (HOSPITAL_COMMUNITY)
Admission: EM | Admit: 2020-10-30 | Discharge: 2020-10-30 | Disposition: A | Discharge status: Home / Self Care | Payer: Self-pay | Attending: Emergency Medicine | Admitting: Emergency Medicine

## 2020-10-30 ENCOUNTER — Other Ambulatory Visit: Payer: Self-pay

## 2020-10-30 ENCOUNTER — Encounter (HOSPITAL_COMMUNITY): Payer: Self-pay

## 2020-10-30 DIAGNOSIS — F17210 Nicotine dependence, cigarettes, uncomplicated: Secondary | ICD-10-CM | POA: Insufficient documentation

## 2020-10-30 DIAGNOSIS — J449 Chronic obstructive pulmonary disease, unspecified: Secondary | ICD-10-CM | POA: Insufficient documentation

## 2020-10-30 DIAGNOSIS — I10 Essential (primary) hypertension: Secondary | ICD-10-CM | POA: Insufficient documentation

## 2020-10-30 DIAGNOSIS — L739 Follicular disorder, unspecified: Secondary | ICD-10-CM | POA: Insufficient documentation

## 2020-10-30 MED ORDER — SULFAMETHOXAZOLE-TRIMETHOPRIM 800-160 MG PO TABS: 1.0000 | ORAL_TABLET | Freq: Two times a day (BID) | ORAL | 0 refills | Status: AC

## 2020-10-30 MED ORDER — SULFAMETHOXAZOLE-TRIMETHOPRIM 800-160 MG PO TABS
1.0000 | ORAL_TABLET | Freq: Once | ORAL | Status: AC
  Administered 2020-10-30: 1 via ORAL
  Filled 2020-10-30: qty 1

## 2020-10-30 MED ORDER — IBUPROFEN 600 MG PO TABS: 600.0000 mg | ORAL_TABLET | Freq: Four times a day (QID) | ORAL | 0 refills | Status: DC | PRN

## 2020-10-30 MED ORDER — IBUPROFEN 400 MG PO TABS
400.0000 mg | ORAL_TABLET | Freq: Once | ORAL | Status: AC
  Administered 2020-10-30: 400 mg via ORAL
  Filled 2020-10-30: qty 1

## 2020-10-30 NOTE — ED Triage Notes (Signed)
Pt complains of "lumps" on scalp. Pt states increased sensitivity to wounds. Pt states wounds appeared after shaving head with clippers that were used to groom a dog.

## 2020-10-30 NOTE — ED Notes (Signed)
Pt advised to stay for observation following administration of ABX and ibuprofen to ensure of no adverse reaction. Pt refused to stay stating "I'm fine I don't need to stay".

## 2020-10-30 NOTE — Discharge Instructions (Addendum)
Complete the entire course of antibiotics prescribed.  Avoid squeezing these pimples as they can spread and worsen by doing this.  I recommend warm compresses to the site several times daily.    Your blood pressure is elevated today - I recommend a blood pressure recheck within 1 week - you may go to the clinic listed below for this:   [facility]  [address] [place], [place] [address] [phone]  Services [facility] offers a variety of basic health services.  Services include but are not limited to: Blood pressure checks  Heart rate checks  Blood sugar checks  Urine analysis  Rapid strep tests  Pregnancy tests.  Health education and referrals  People needing more complex services will be directed to a physician online. Using these virtual visits, doctors can evaluate and prescribe medicine and treatments. There will be no medication on-site, though [facility] will help patients fill their prescriptions at little to no cost.   For More information please go to: [email or web address]

## 2020-11-02 NOTE — ED Provider Notes (Signed)
Joshua Thomas Provider Note   CSN: [id number] Arrival date & time: 10/30/20  3716     History Chief Complaint  Patient presents with   Wound Check    Joshua Thomas is a 45 y.o. male presenting for evaluation of pimples on his posterior scalp which have developed this past week after using his dogs clippers to shave his head.  He reports prior history of similar outbreak to his scalp, denies ever requiring I & D to the sites, denies history of MRSA.  Also notes tender nodules posterior head. He is diabetic, reporting cbg's under good control.  He reports pain at the sites, denies drainage.  No fevers, chills, n/v or other complaint.  No treatments prior to arrival.   The history is provided by the patient.      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   COPD (chronic obstructive pulmonary disease) (HCC)    DDD (degenerative disc disease), lumbar    Degenerative disc disease, lumbar    Depression    Discitis    GERD (gastroesophageal reflux disease)    Hepatitis C    Hepatitis C    History of stomach ulcers    Hx of diabetes mellitus    due to infection   Hypertension    Lung nodules    3 on L and 2 on R   PTSD (post-traumatic stress disorder)    Sciatica    Substance abuse [facility])     Patient Active Problem List   Diagnosis Date Noted   MDD (major depressive disorder), recurrent severe, 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   Opioid abuse (HCC) 08/12/2020   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   Essential hypertension 09/23/2017   Hyperglycemia 06/03/2017   GERD (gastroesophageal reflux disease) 05/17/2017   Bipolar disorder (HCC) 05/17/2017   Substance induced mood disorder (HCC) 05/21/2015   Chronic hepatitis C virus genotype 1a infection (HCC)      Past Surgical History:  Procedure Laterality Date   FRACTURE SURGERY     HARDWARE REMOVAL  01/27/2012   Procedure: HARDWARE REMOVAL;  Surgeon: Kathryne Hitch, MD;  [facility];  Service: Orthopedics;  Laterality: Left;   IR LUMBAR DISC ASPIRATION W/IMG GUIDE  06/08/2017   RADIOLOGY WITH ANESTHESIA N/A 06/08/2017   Procedure: DISC ASPIRATION;  Surgeon: Julieanne Cotton, MD;  [facility];  Service: Radiology;  Laterality: N/A;   TIBIA IM NAIL INSERTION  01/27/2012   Procedure: INTRAMEDULLARY (IM) NAIL TIBIAL;  Surgeon: Kathryne Hitch, MD;  [facility];  Service: Orthopedics;  Laterality: Left;  Removal of IM Rod and Screws Left Tibia with Exchange Nail, Allograft Bone Graft Left Tibia       Family History  Problem Relation Age of Onset   Diabetes Mother    Alcohol abuse Father    Cirrhosis Father    Aneurysm Father     Social History   Tobacco Use   Smoking status: Every Day    Packs/day: 1.00    Years: 33.00    Pack years: 33.00    Types: Cigarettes   Smokeless tobacco: Former    Quit date: 08/08/2006   Tobacco comments:    1 PPD  Substance Use Topics   Alcohol  use: Yes    Alcohol/week: 1.0 standard drink    Types: 1 Standard drinks or equivalent per week    Comment: occasionally   Drug use: Not Currently    Home Medications Prior to Admission medications   Medication Sig Start Date End Date Taking? Authorizing Provider  ibuprofen (ADVIL) 600 MG tablet Take 1 tablet (600 mg total) by mouth every 6 (six) hours as needed. 10/30/20  Yes Noell Shular, Raynelle Fanning, PA-C  sulfamethoxazole-trimethoprim (BACTRIM DS) 800-160 MG tablet Take 1 tablet by mouth 2 (two) times daily for 10 days. 10/30/20 11/09/20 Yes Prentis Langdon, Raynelle Fanning, PA-C  gabapentin (NEURONTIN) 300 MG capsule Take 1 capsule (300 mg total) by mouth 3 (three) times daily. Patient not taking: Reported on 10/08/2020 09/29/20 10/29/20  Laveda Abbe, NP  metFORMIN (GLUCOPHAGE-XR) 500 MG 24 hr tablet  Take 1 tablet (500 mg total) by mouth 2 (two) times daily with a meal. Patient not taking: No sig reported 09/29/20   Laveda Abbe, NP  naloxone [facility]) nasal spray 4 mg/0.1 mL Take in case of overdose Patient not taking: No sig reported 08/07/20   Long, Arlyss Repress, MD  nicotine (NICODERM CQ - DOSED IN MG/24 HOURS) 21 mg/24hr patch Place 1 patch (21 mg total) onto the skin daily at 6 (six) AM. Patient not taking: No sig reported 09/30/20   Laveda Abbe, NP  sertraline (ZOLOFT) 50 MG tablet Take 1 tablet (50 mg total) by mouth at bedtime. Patient not taking: No sig reported 09/29/20   Laveda Abbe, NP  traZODone (DESYREL) 50 MG tablet Take 1 tablet (50 mg total) by mouth at bedtime as needed for sleep. Patient not taking: No sig reported 09/29/20   Laveda Abbe, NP    Allergies    Patient has no known allergies.  Review of Systems   Review of Systems  Constitutional:  Negative for chills and fever.  HENT: Negative.    Respiratory: Negative.    Cardiovascular: Negative.   Gastrointestinal: Negative.   Skin:        Negative except as mentioned in HPI.    Neurological: Negative.  Negative for numbness.  Hematological:  Positive for adenopathy.   Physical Exam Updated Vital Signs BP (!) 156/127 (BP Location: Right Arm)   Pulse 79   Temp 97.7 F (36.5 C) (Oral)   Resp 20   Ht 5\' 3"  (1.6 m)   Wt 72.6 kg   SpO2 100%   BMI 28.34 kg/m   Physical Exam Constitutional:      General: He is not in acute distress.    Appearance: He is well-developed.  HENT:     Head: Atraumatic.  Cardiovascular:     Rate and Rhythm: Normal rate.  Pulmonary:     Effort: Pulmonary effort is normal.     Breath sounds: No wheezing.  Musculoskeletal:        General: Normal range of motion.     Cervical back: Neck supple.  Lymphadenopathy:     Head:     Right side of head: Occipital adenopathy present.     Left side of head: Occipital adenopathy present.  Skin:     Findings: Rash present. Rash is papular.     Comments: 5 small indurated papules posterior scalp, no pustule formation, no fluctuance or surrounding erythema.      ED Results / Procedures / Treatments   Labs (all labs ordered are listed, but only abnormal results are displayed) Labs Reviewed - No data to  display  EKG None  Radiology No results found.  Procedures Procedures   Medications Ordered in ED Medications  sulfamethoxazole-trimethoprim (BACTRIM DS) 800-160 MG per tablet 1 tablet (1 tablet Oral Given 10/30/20 0949)  ibuprofen (ADVIL) tablet 400 mg (400 mg Oral Given 10/30/20 0949)    ED Course  I have reviewed the triage vital signs and the nursing notes.  Pertinent labs & imaging results that were available during my care of the patient were reviewed by me and considered in my medical decision making (see chart for details).    MDM Rules/Calculators/A&P                           Pt with papular posterior scalp infection.  No drainable abscess identified.  Discussed warm compresses, started on bactrim.  Return precautions outlined.  Also discussed elevated bp, he has a diagnosis of HTN, on amlodipine but has not taken today. Denies cp, vision changes, sob, headache other than reproducible scalp pain.  Advised to take his bp meds, recheck bp within 1 week, no current pcp, referral to [facility] for bp recheck.  Final Clinical Impression(s) / ED Diagnoses Final diagnoses:  Folliculitis  Hypertension, unspecified type    Rx / DC Orders ED Discharge Orders          Ordered    sulfamethoxazole-trimethoprim (BACTRIM DS) 800-160 MG tablet  2 times daily        10/30/20 0941    ibuprofen (ADVIL) 600 MG tablet  Every 6 hours PRN        10/30/20 0941             Burgess Amor, PA-C 11/02/20 1113    Bethann Berkshire, MD 11/08/20 1236

## 2020-11-22 ENCOUNTER — Other Ambulatory Visit: Payer: Self-pay

## 2020-11-22 ENCOUNTER — Encounter (HOSPITAL_COMMUNITY): Payer: Self-pay

## 2020-11-22 ENCOUNTER — Emergency Department (HOSPITAL_COMMUNITY)
Admission: EM | Admit: 2020-11-22 | Discharge: 2020-11-22 | Disposition: A | Discharge status: Home / Self Care | Payer: Self-pay | Attending: Emergency Medicine | Admitting: Emergency Medicine

## 2020-11-22 DIAGNOSIS — Z79899 Other long term (current) drug therapy: Secondary | ICD-10-CM | POA: Insufficient documentation

## 2020-11-22 DIAGNOSIS — R000 Tachycardia, unspecified: Secondary | ICD-10-CM | POA: Insufficient documentation

## 2020-11-22 DIAGNOSIS — I10 Essential (primary) hypertension: Secondary | ICD-10-CM | POA: Insufficient documentation

## 2020-11-22 DIAGNOSIS — E119 Type 2 diabetes mellitus without complications: Secondary | ICD-10-CM | POA: Insufficient documentation

## 2020-11-22 DIAGNOSIS — F17210 Nicotine dependence, cigarettes, uncomplicated: Secondary | ICD-10-CM | POA: Insufficient documentation

## 2020-11-22 DIAGNOSIS — Z7984 Long term (current) use of oral hypoglycemic drugs: Secondary | ICD-10-CM | POA: Insufficient documentation

## 2020-11-22 DIAGNOSIS — J449 Chronic obstructive pulmonary disease, unspecified: Secondary | ICD-10-CM | POA: Insufficient documentation

## 2020-11-22 DIAGNOSIS — F1910 Other psychoactive substance abuse, uncomplicated: Secondary | ICD-10-CM | POA: Insufficient documentation

## 2020-11-22 DIAGNOSIS — Y900 Blood alcohol level of less than 20 mg/100 ml: Secondary | ICD-10-CM | POA: Insufficient documentation

## 2020-11-22 LAB — COMPREHENSIVE METABOLIC PANEL
ALT: 94 U/L — ABNORMAL HIGH (ref 0–44)
AST: 56 U/L — ABNORMAL HIGH (ref 15–41)
Albumin: 4.1 g/dL (ref 3.5–5.0)
Alkaline Phosphatase: 38 U/L (ref 38–126)
Anion gap: 8 (ref 5–15)
BUN: 14 mg/dL (ref 6–20)
CO2: 24 mmol/L (ref 22–32)
Calcium: 8.9 mg/dL (ref 8.9–10.3)
Chloride: 101 mmol/L (ref 98–111)
Creatinine, Ser: 1.04 mg/dL (ref 0.61–1.24)
GFR, Estimated: >60 mL/min (ref >60)
Glucose, Bld: 185 mg/dL — ABNORMAL HIGH (ref 70–99)
Potassium: 3.7 mmol/L (ref 3.5–5.1)
Sodium: 133 mmol/L — ABNORMAL LOW (ref 135–145)
Total Bilirubin: 0.8 mg/dL (ref 0.3–1.2)
Total Protein: 7.9 g/dL (ref 6.5–8.1)

## 2020-11-22 LAB — CBC WITH DIFFERENTIAL/PLATELET
Abs Immature Granulocytes: 0.02 10*3/uL (ref 0.00–0.07)
Basophils Absolute: 0 10*3/uL (ref 0.0–0.1)
Basophils Relative: 0 %
Eosinophils Absolute: 0.1 10*3/uL (ref 0.0–0.5)
Eosinophils Relative: 1 %
HCT: 43.1 % (ref 39.0–52.0)
Hemoglobin: 14.2 g/dL (ref 13.0–17.0)
Immature Granulocytes: 0 %
Lymphocytes Relative: 13 %
Lymphs Abs: 1 10*3/uL (ref 0.7–4.0)
MCH: 31.7 pg (ref 26.0–34.0)
MCHC: 32.9 g/dL (ref 30.0–36.0)
MCV: 96.2 fL (ref 80.0–100.0)
Monocytes Absolute: 0.4 10*3/uL (ref 0.1–1.0)
Monocytes Relative: 5 %
Neutro Abs: 5.9 10*3/uL (ref 1.7–7.7)
Neutrophils Relative %: 81 %
Platelets: 164 10*3/uL (ref 150–400)
RBC: 4.48 MIL/uL (ref 4.22–5.81)
RDW: 14.1 % (ref 11.5–15.5)
WBC: 7.3 10*3/uL (ref 4.0–10.5)
nRBC: 0 % (ref 0.0–0.2)

## 2020-11-22 LAB — ETHANOL: Alcohol, Ethyl (B): <10 mg/dL (ref <10)

## 2020-11-22 MED ORDER — NICOTINE 21 MG/24HR TD PT24
21.0000 mg | MEDICATED_PATCH | Freq: Once | TRANSDERMAL | Status: DC
  Administered 2020-11-22: 21 mg via TRANSDERMAL
  Filled 2020-11-22: qty 1

## 2020-11-22 NOTE — ED Provider Notes (Signed)
[facility] EMERGENCY DEPARTMENT Provider Note   CSN: [id number] Arrival date & time: 11/22/20  1851     History Chief Complaint  Patient presents with   Detox    Joshua Thomas is a 45 y.o. male.  HPI  This patient is a 45 year old male, he has a known history of bipolar disorder as well as chronic opiate abuse, he currently shoots opiates and today had been shooting fentanyl.  He was doing this to get high but denies self injury.  He had also overdosed a couple nights ago but was not suicidal at that time either.  He required multiple doses of Narcan today and came around back to his normal self.  He states that he wants to go to detox he does not want to kill himself he was just try to get high, he has chronic back pain for which she got hooked on opiates at a young age.  He denies fevers chills nausea vomiting or diarrhea, he is not having any chest pain coughing or shortness of breath.  Symptoms resolved with Narcan, he is currently back to his baseline.  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   COPD (chronic obstructive pulmonary disease) ([place])    DDD (degenerative disc disease), lumbar    Degenerative disc disease, lumbar    Depression    Discitis    GERD (gastroesophageal reflux disease)    Hepatitis C    Hepatitis C    History of stomach ulcers    Hx of diabetes mellitus    due to infection   Hypertension    Lung nodules    3 on L and 2 on R   PTSD (post-traumatic stress disorder)    Sciatica    Substance abuse [facility])     Patient Active Problem List   Diagnosis Date Noted   MDD (major depressive disorder), recurrent severe, without psychosis ([place])  08/12/2020   Opioid abuse ([place]) 08/12/2020   Amphetamine abuse ([place]) 08/12/2020   Essential hypertension 09/23/2017   Hyperglycemia 06/03/2017   GERD (gastroesophageal reflux disease) 05/17/2017   Bipolar disorder ([place]) 05/17/2017   Substance induced mood disorder ([place]) 05/21/2015   Chronic hepatitis C virus genotype 1a infection ([place])     Past Surgical History:  Procedure Laterality Date   FRACTURE SURGERY     HARDWARE REMOVAL  01/27/2012   Procedure: HARDWARE REMOVAL;  Surgeon: Mcarthur Rossetti, MD;  [facility];  Service: Orthopedics;  Laterality: Left;   IR LUMBAR [place] W/IMG GUIDE  06/08/2017   RADIOLOGY WITH ANESTHESIA N/A 06/08/2017   Procedure: DISC ASPIRATION;  Surgeon: Luanne Bras, MD;  Location: [place];  Service: Radiology;  Laterality: N/A;   TIBIA IM NAIL INSERTION  01/27/2012   Procedure: INTRAMEDULLARY (IM) NAIL TIBIAL;  Surgeon: Mcarthur Rossetti, MD;  [facility];  Service: Orthopedics;  Laterality: Left;  Removal of IM Rod and Screws Left Tibia with Exchange Nail, Allograft Bone Graft Left Tibia       Family History  Problem Relation Age of Onset   Diabetes Mother    Alcohol abuse  Father    Cirrhosis Father    Aneurysm Father     Social History   Tobacco Use   Smoking status: Every Day    Packs/day: 1.00    Years: 33.00    Pack years: 33.00    Types: Cigarettes   Smokeless tobacco: Former    Quit date: 08/08/2006   Tobacco comments:    1 PPD  Substance Use Topics   Alcohol use: Yes    Alcohol/week: 1.0 standard drink    Types: 1 Standard drinks or equivalent per week    Comment: occasionally   Drug use: Not Currently    Home Medications Prior to Admission medications   Medication Sig Start Date End Date Taking? Authorizing Provider  gabapentin (NEURONTIN) 300 MG capsule Take 1 capsule (300 mg total) by mouth 3 (three) times daily. Patient not taking: Reported on 10/08/2020 09/29/20 10/29/20  Laveda Abbe, NP  ibuprofen (ADVIL) 600 MG tablet Take 1 tablet (600 mg total) by mouth every 6 (six) hours as needed. 10/30/20   Burgess Amor, PA-C  metFORMIN (GLUCOPHAGE-XR) 500 MG 24 hr tablet Take 1 tablet (500 mg total) by mouth 2 (two) times daily with a meal. Patient not taking: No sig reported 09/29/20   Laveda Abbe, NP  naloxone [facility]) nasal spray 4 mg/0.1 mL Take in case of overdose Patient not taking: No sig reported 08/07/20   Long, Arlyss Repress, MD  nicotine (NICODERM CQ - DOSED IN MG/24 HOURS) 21 mg/24hr patch Place 1 patch (21 mg total) onto the skin daily at 6 (six) AM. Patient not taking: No sig reported 09/30/20   Laveda Abbe, NP  sertraline (ZOLOFT) 50 MG tablet Take 1 tablet (50 mg total) by mouth at bedtime. Patient not taking: No sig reported 09/29/20   Laveda Abbe, NP  traZODone (DESYREL) 50 MG tablet Take 1 tablet (50 mg total) by mouth at bedtime as needed for sleep. Patient not taking: No sig reported 09/29/20   Laveda Abbe, NP    Allergies    Patient has no known allergies.  Review of Systems   Review of Systems  All other systems reviewed and are negative.  Physical Exam Updated Vital Signs BP 112/75 (BP Location: Right Arm)   Pulse 89   Temp 97.9 F (36.6 C) (Oral)   Resp 16   Ht 1.6 m (5\' 3" )   Wt 68 kg   SpO2 95%   BMI 26.57 kg/m   Physical Exam Vitals and nursing note reviewed.  Constitutional:      General: He is not in acute distress.    Appearance: He is well-developed.  HENT:     Head: Normocephalic and atraumatic.     Mouth/Throat:     Pharynx: No oropharyngeal exudate.  Eyes:     General: No scleral icterus.       Right eye: No discharge.        Left eye: No discharge.     Conjunctiva/sclera: Conjunctivae normal.     Pupils: Pupils are equal, round, and reactive to light.  Neck:     Thyroid: No thyromegaly.     Vascular: No JVD.  Cardiovascular:     Rate and Rhythm: Regular rhythm. Tachycardia  present.     Heart sounds: Normal heart sounds. No murmur heard.   No friction rub. No gallop.     Comments: HR of 100 on my exam. Pulmonary:     Effort: Pulmonary effort is normal. No  respiratory distress.     Breath sounds: Normal breath sounds. No wheezing or rales.  Abdominal:     General: Bowel sounds are normal. There is no distension.     Palpations: Abdomen is soft. There is no mass.     Tenderness: There is no abdominal tenderness.  Musculoskeletal:        General: No tenderness. Normal range of motion.     Cervical back: Normal range of motion and neck supple.     Right lower leg: No edema.     Left lower leg: No edema.  Lymphadenopathy:     Cervical: No cervical adenopathy.  Skin:    General: Skin is warm and dry.     Findings: No erythema or rash.  Neurological:     General: No focal deficit present.     Mental Status: He is alert.     Coordination: Coordination normal.  Psychiatric:        Behavior: Behavior normal.    ED Results / Procedures / Treatments   Labs (all labs ordered are listed, but only abnormal results are displayed) Labs Reviewed  COMPREHENSIVE METABOLIC PANEL - Abnormal; Notable for the following components:      Result Value   Sodium 133 (*)    Glucose, Bld 185 (*)    AST 56 (*)    ALT 94 (*)    All other components within normal limits  CBC WITH DIFFERENTIAL/PLATELET  ETHANOL  RAPID URINE DRUG SCREEN, HOSP PERFORMED    EKG None  Radiology No results found.  Procedures Procedures   Medications Ordered in ED Medications  nicotine (NICODERM CQ - dosed in mg/24 hours) patch 21 mg (21 mg Transdermal Patch Applied 11/22/20 2005)    ED Course  I have reviewed the triage vital signs and the nursing notes.  Pertinent labs & imaging results that were available during my care of the patient were reviewed by me and considered in my medical decision making (see chart for details).    MDM Rules/Calculators/A&P                            The patient appears sincere in wanting help but also admits that he probably will not stay sober given his history of drug use and addiction.  He denies suicidality and has nothing about him that makes me feel like he is depressed or suicidal.  We will get labs and give him list for possible outpatient placement.  Pt denies suicidatily Labs unremarkable Pt given resource list   Final Clinical Impression(s) / ED Diagnoses Final diagnoses:  Substance abuse [facility])    Rx / DC Orders ED Discharge Orders     None        Eber Hong, MD 11/22/20 2157

## 2020-11-22 NOTE — ED Notes (Signed)
Pt provided drink at this time. Pt calm and cooperative.

## 2020-11-22 NOTE — ED Notes (Signed)
Pt visualized walking out of ED °

## 2020-11-22 NOTE — ED Notes (Signed)
Pt given multiple snacks and drinks. Pt still unable to provide urine sample. MD notified.

## 2020-11-22 NOTE — ED Triage Notes (Signed)
Pt here with mother. She brought him in because he accidentally overdosed on fentanyl. He said he "wasn't that much, it was just potent."  Mother says they narcaned him 4x.  He denies SI/HI. He is alert and oriented . He is upset that she has made him come here; he feels that it is not needed.   He says he wants to go to a facility in [facility] or if they are full then he will go anywhere. He states he has insurance now but just doesn't have a card yet.

## 2020-11-22 NOTE — Discharge Instructions (Signed)
Substance Abuse Treatment Programs ° °Intensive Outpatient Programs °[facility]     °[address]      °[place], [place]                   °[phone]      ° °[facility] °[address] °[place], [place] °[phone] ° °[facility]     °(Inpatient and outpatient)     °[address]           °[phone]   ° °[facility] °[phone] (Suboxone and Methadone) ° °[address]      °[place], [place] [address]      °[phone]      ° °[address] °[place], [place] °[phone] ° °[facility] (Outpatient/Inpatient, [facility])    °(insurance only) [phone]      °       °[facility] (Groups & Residential) °[place], [place] °[phone] ° °   °[facility]     °[address]     °[place], [place]      °[phone]      ° °[facility] (for caregivers and family) °[address] °[place], [place] °336-299-4655 ° ° ° ° ° °Residential Treatment Programs °[facility]      °[address], [place], [place] [address]  °[phone]      ° °[facility] °[address] [place], [place] [address] °[phone] ° °[facility]        °[phone]      ° °[facility] °[phone] ° °[facility]             °[address]                                         °[place], [place]                                                °[phone] or [phone]                              ° °[facility] [place] °[address] °[place], [address] °[phone] ° °[facility]    °[address]      °[place], [place]     °[phone]      ° °[facility] °[address] °[place], [place] °[phone] ° °[facility]   °[address]     °[place], [place] [address]     °[phone]      °Admissions: 8am-3pm M-F ° °[facility] °[address] °[place],  [place] °[phone] ° °[facility]: Residential Program (90 Days)   °[place], [place]      °336-725-8389 or [phone]    ° °[facility] °[place], [place] °(Walk in Hours over the weekend or by referral) ° °[facility] °[address], [place],  [address] °([phone] ° °Crisis Mobile: [facility]:  1-877-626-1772 (for crisis response 24 hours a day) °[facility] Hotline:      1-800-256-2452 °Outpatient Psychiatry and Counseling ° °[facility]: Mobile Crisis   Management 24 hours:  1-877-626-1772 ° °[facility] sliding scale fee and walk in schedule: M-F 8am-12pm/1pm-3pm °[address]  °[place], [place] [address] °[phone] ° °[facility] °[address] °[place], [place] [address] °[phone] ° °[facility] ([facility] known [facility])- new patient walk-in appointments available Monday - Friday 8am -3pm.          °[address] °[place], [place] [address] °336-676-6840 or crisis line- 336-676-6905 ° °[facility]/ Intensive Outpatient Therapy Program °[address] °[place], [place] [address] °336-832-9804 ° °[facility]                  °Crisis Services      °[phone]      °[address]     °[place], [place] [address]                ° °[facility]   °[facility] °[phone] °[address] °[place], [place] [address] ° ° °[facility]          °[address]  °[place], [place] [address]       °[phone] ° °[facility] °[address] °[place], [place] [address] °[phone] ° °[facility]    °[address]    °[place], [place] [address]     °[phone]      ° °Parish McKinney, MD     °[address]     °[place] [address]     °[phone]     °  °[facility] °[address] °[place] [address] ° °[facility]     °[address]     °[place], [place]      °[phone]      ° °[facility] °Shamsher Ahluwalia, MD °[address] °[place], [place] [address] °[phone] ° °[facility]     °[address]     °[place], [place] [address]     °336-274-1237      ° °[facility] °[address] °[place], [place] [address] °336-854-4450 °Resources for Temporary Residential Assistance/Crisis Centers ° °DAY CENTERS °[facility] °M-F 8am-3pm   °[address] [place], [place] [address]   336-332-0824 °Services include: laundry, barbering, support groups, case management, phone  & computer access, showers, AA/NA mtgs, mental health/substance abuse nurse, job skills class, disability information, VA assistance, spiritual classes, etc.  ° °HOMELESS SHELTERS ° °[facility]     °[facility]   °[address], [place]     °[phone]       °       °[facility] (women and children)       °[address] °[place], [place] [address] °336-275-0820 °[email or web address] for application and process °Application Required ° °[facility]   °[address]    °[place] [address]     °[phone]       °             °[facility] °[address] °[place], [place] [address] °[phone] °[phone](schedule application appt.) °Application Required ° °[facility] (women only)    °[address]     °[place], [place] [address]     °336-884-1039      °  Intake starts 6pm daily °Need valid ID, SSC, & Police report °[facility] [place] °[address] °[place], [place] °336-881-5420 °Application Required ° °[facility] (men only)     °[address]      °[place], [place]     °[phone]      ° °[facility] °(Pregnant women only) °[address] °[place], [place] °[phone] ° °[facility]      °[address]      °[place], [place] [address]     °[phone]      °       °[facility] [place] [facility] °[address] °[place], [place] °336-723-1848 °90 day commitment/SA/Application process ° °[facility](men only)     °[address]     °[place], [place]     °[phone]       °Check-in at 7pm     °       °[facility] °[address] °[place], [place] [address] °[phone] °Men/Women/Women and Children must be there by 7 pm ° °[facility] °[place], [place] °336-722-8721                ° °

## 2020-12-07 ENCOUNTER — Other Ambulatory Visit: Payer: Self-pay

## 2020-12-07 ENCOUNTER — Emergency Department (HOSPITAL_COMMUNITY)
Admission: EM | Admit: 2020-12-07 | Discharge: 2020-12-08 | Disposition: A | Discharge status: Other Acute Inpatient Hospital | Payer: 59 | Attending: Emergency Medicine | Admitting: Emergency Medicine

## 2020-12-07 ENCOUNTER — Encounter (HOSPITAL_COMMUNITY): Payer: Self-pay | Admitting: Emergency Medicine

## 2020-12-07 DIAGNOSIS — Z20822 Contact with and (suspected) exposure to covid-19: Secondary | ICD-10-CM | POA: Insufficient documentation

## 2020-12-07 DIAGNOSIS — J449 Chronic obstructive pulmonary disease, unspecified: Secondary | ICD-10-CM | POA: Insufficient documentation

## 2020-12-07 DIAGNOSIS — Z7984 Long term (current) use of oral hypoglycemic drugs: Secondary | ICD-10-CM | POA: Insufficient documentation

## 2020-12-07 DIAGNOSIS — F1190 Opioid use, unspecified, uncomplicated: Secondary | ICD-10-CM | POA: Diagnosis present

## 2020-12-07 DIAGNOSIS — F1520 Other stimulant dependence, uncomplicated: Secondary | ICD-10-CM | POA: Insufficient documentation

## 2020-12-07 DIAGNOSIS — E119 Type 2 diabetes mellitus without complications: Secondary | ICD-10-CM | POA: Insufficient documentation

## 2020-12-07 DIAGNOSIS — F17210 Nicotine dependence, cigarettes, uncomplicated: Secondary | ICD-10-CM | POA: Insufficient documentation

## 2020-12-07 DIAGNOSIS — F1120 Opioid dependence, uncomplicated: Secondary | ICD-10-CM | POA: Diagnosis not present

## 2020-12-07 DIAGNOSIS — F1110 Opioid abuse, uncomplicated: Secondary | ICD-10-CM | POA: Diagnosis present

## 2020-12-07 DIAGNOSIS — F314 Bipolar disorder, current episode depressed, severe, without psychotic features: Secondary | ICD-10-CM | POA: Diagnosis present

## 2020-12-07 DIAGNOSIS — R45851 Suicidal ideations: Secondary | ICD-10-CM | POA: Insufficient documentation

## 2020-12-07 DIAGNOSIS — F332 Major depressive disorder, recurrent severe without psychotic features: Secondary | ICD-10-CM | POA: Diagnosis present

## 2020-12-07 DIAGNOSIS — T40601A Poisoning by unspecified narcotics, accidental (unintentional), initial encounter: Secondary | ICD-10-CM

## 2020-12-07 DIAGNOSIS — F1994 Other psychoactive substance use, unspecified with psychoactive substance-induced mood disorder: Secondary | ICD-10-CM | POA: Diagnosis present

## 2020-12-07 DIAGNOSIS — I10 Essential (primary) hypertension: Secondary | ICD-10-CM | POA: Diagnosis not present

## 2020-12-07 DIAGNOSIS — T400X1A Poisoning by opium, accidental (unintentional), initial encounter: Secondary | ICD-10-CM | POA: Diagnosis not present

## 2020-12-07 LAB — RAPID URINE DRUG SCREEN, HOSP PERFORMED
Amphetamines: POSITIVE — AB
Barbiturates: NOT DETECTED
Benzodiazepines: POSITIVE — AB
Cocaine: NOT DETECTED
Opiates: NOT DETECTED
Tetrahydrocannabinol: NOT DETECTED

## 2020-12-07 LAB — COMPREHENSIVE METABOLIC PANEL
ALT: 418 U/L — ABNORMAL HIGH (ref 0–44)
AST: 442 U/L — ABNORMAL HIGH (ref 15–41)
Albumin: 4 g/dL (ref 3.5–5.0)
Alkaline Phosphatase: 80 U/L (ref 38–126)
Anion gap: 13 (ref 5–15)
BUN: 30 mg/dL — ABNORMAL HIGH (ref 6–20)
CO2: 21 mmol/L — ABNORMAL LOW (ref 22–32)
Calcium: 8.4 mg/dL — ABNORMAL LOW (ref 8.9–10.3)
Chloride: 105 mmol/L (ref 98–111)
Creatinine, Ser: 1.41 mg/dL — ABNORMAL HIGH (ref 0.61–1.24)
GFR, Estimated: >60 mL/min (ref >60)
Glucose, Bld: 200 mg/dL — ABNORMAL HIGH (ref 70–99)
Potassium: 4.4 mmol/L (ref 3.5–5.1)
Sodium: 139 mmol/L (ref 135–145)
Total Bilirubin: 0.3 mg/dL (ref 0.3–1.2)
Total Protein: 7.6 g/dL (ref 6.5–8.1)

## 2020-12-07 LAB — CBG MONITORING, ED: Glucose-Capillary: 304 mg/dL — ABNORMAL HIGH (ref 70–99)

## 2020-12-07 LAB — CBC WITH DIFFERENTIAL/PLATELET
Abs Immature Granulocytes: 0.05 10*3/uL (ref 0.00–0.07)
Basophils Absolute: 0 10*3/uL (ref 0.0–0.1)
Basophils Relative: 0 %
Eosinophils Absolute: 0.1 10*3/uL (ref 0.0–0.5)
Eosinophils Relative: 1 %
HCT: 45.9 % (ref 39.0–52.0)
Hemoglobin: 14.5 g/dL (ref 13.0–17.0)
Immature Granulocytes: 1 %
Lymphocytes Relative: 6 %
Lymphs Abs: 0.7 10*3/uL (ref 0.7–4.0)
MCH: 30.9 pg (ref 26.0–34.0)
MCHC: 31.6 g/dL (ref 30.0–36.0)
MCV: 97.9 fL (ref 80.0–100.0)
Monocytes Absolute: 0.3 10*3/uL (ref 0.1–1.0)
Monocytes Relative: 3 %
Neutro Abs: 9.6 10*3/uL — ABNORMAL HIGH (ref 1.7–7.7)
Neutrophils Relative %: 89 %
Platelets: 163 10*3/uL (ref 150–400)
RBC: 4.69 MIL/uL (ref 4.22–5.81)
RDW: 13.9 % (ref 11.5–15.5)
WBC: 10.7 10*3/uL — ABNORMAL HIGH (ref 4.0–10.5)
nRBC: 0 % (ref 0.0–0.2)

## 2020-12-07 LAB — ACETAMINOPHEN LEVEL: Acetaminophen (Tylenol), Serum: <10 ug/mL — ABNORMAL LOW (ref 10–30)

## 2020-12-07 LAB — SALICYLATE LEVEL: Salicylate Lvl: <7 mg/dL — ABNORMAL LOW (ref 7.0–30.0)

## 2020-12-07 LAB — CK: Total CK: 836 U/L — ABNORMAL HIGH (ref 49–397)

## 2020-12-07 MED ORDER — SODIUM CHLORIDE 0.9 % IV BOLUS
1000.0000 mL | Freq: Once | INTRAVENOUS | Status: AC
  Administered 2020-12-07: 11:00:00 1000 mL via INTRAVENOUS

## 2020-12-07 MED ORDER — NALOXONE HCL 4 MG/0.1ML NA LIQD: 1.0000 | NASAL | Status: DC | PRN

## 2020-12-07 MED ORDER — NICOTINE 21 MG/24HR TD PT24
21.0000 mg | MEDICATED_PATCH | Freq: Every day | TRANSDERMAL | Status: DC
  Administered 2020-12-07: 18:00:00 21 mg via TRANSDERMAL
  Filled 2020-12-07: qty 1

## 2020-12-07 MED ORDER — ONDANSETRON HCL 4 MG PO TABS: 4.0000 mg | ORAL_TABLET | Freq: Three times a day (TID) | ORAL | Status: DC | PRN

## 2020-12-07 MED ORDER — SODIUM CHLORIDE 0.9 % IV BOLUS
1000.0000 mL | Freq: Once | INTRAVENOUS | Status: AC
  Administered 2020-12-07: 13:00:00 1000 mL via INTRAVENOUS

## 2020-12-07 NOTE — ED Notes (Signed)
Pt requesting a dinner tray. This Clinical research associate provided patient a lean cuisine meal with saltine crackers and a ginger ale. Pt sitting up and eating his dinner. RN notified. Will continue to monitor pt

## 2020-12-07 NOTE — ED Notes (Signed)
Pt wanded by security. 
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
## 2020-12-07 NOTE — ED Provider Notes (Signed)
[facility] EMERGENCY DEPARTMENT Provider Note   CSN: [id number] Arrival date & time: 12/07/20  [id number]     History Chief Complaint  Patient presents with   Drug Overdose    Joshua Thomas is a 45 y.o. male with a history of known chronic opioid abuse, bipolar disorder, depression, hepatitis C PTSD, diabetes presenting with an acute overdose.  He is currently living in his mother's home.  Call placed to his mother [name] who states she went into his room this morning to wake him and found that he was unresponsive on his knees but wedged between his bed and the nightstand.  Her other son who is an EMT promptly gave him 8 mg of Narcan and he did have response from this.  Patient endorses shooting fentanyl but does not give any further information. Mother states he will take anything that is available but cannot give any further detail regarding substances used.  He is awake but moaning and not providing any further details.  Mother states that he does have a history of depression, but has not verbalized any suicidality.  She does not believe he would intentionally try to kill himself in her home.    The history is provided by the patient.      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   COPD (chronic obstructive pulmonary disease) (HCC)    DDD (degenerative disc disease), lumbar    Degenerative disc disease, lumbar    Depression    Discitis    GERD (gastroesophageal reflux disease)    Hepatitis C    Hepatitis C    History of stomach ulcers    Hx of diabetes mellitus    due to infection   Hypertension    Lung nodules    3 on L and 2 on R   PTSD (post-traumatic stress disorder)    Sciatica    Substance abuse [facility])     Patient Active Problem List   Diagnosis Date Noted    MDD (major depressive disorder), recurrent severe, without psychosis ([place]) 08/12/2020   Opioid abuse ([place]) 08/12/2020   Amphetamine abuse ([place]) 08/12/2020   Essential hypertension 09/23/2017   Hyperglycemia 06/03/2017   GERD (gastroesophageal reflux disease) 05/17/2017   Bipolar disorder ([place]) 05/17/2017   Substance induced mood disorder ([place]) 05/21/2015   Chronic hepatitis C virus genotype 1a infection ([place])     Past Surgical History:  Procedure Laterality Date   FRACTURE SURGERY     HARDWARE REMOVAL  01/27/2012   Procedure: HARDWARE REMOVAL;  Surgeon: Mcarthur Rossetti, MD;  Location: [facility];  Service: Orthopedics;  Laterality: Left;   IR LUMBAR [place] W/IMG GUIDE  06/08/2017   RADIOLOGY WITH ANESTHESIA N/A 06/08/2017   Procedure: DISC ASPIRATION;  Surgeon: Luanne Bras, MD;  Location: [place];  Service: Radiology;  Laterality: N/A;   TIBIA IM NAIL INSERTION  01/27/2012   Procedure: INTRAMEDULLARY (IM) NAIL TIBIAL;  Surgeon: Mcarthur Rossetti, MD;  Location: [facility];  Service: Orthopedics;  Laterality: Left;  Removal of IM Rod and Screws Left Tibia with Exchange Nail, Allograft Bone Graft Left Tibia       Family History  Problem Relation Age of Onset   Diabetes Mother  Alcohol abuse Father    Cirrhosis Father    Aneurysm Father     Social History   Tobacco Use   Smoking status: Every Day    Packs/day: 1.00    Years: 33.00    Pack years: 33.00    Types: Cigarettes   Smokeless tobacco: Former    Quit date: 08/08/2006   Tobacco comments:    1 PPD  Substance Use Topics   Alcohol use: Yes    Alcohol/week: 1.0 standard drink    Types: 1 Standard drinks or equivalent per week    Comment: occasionally   Drug use: Yes    Types: IV    Home Medications Prior to Admission medications   Medication Sig Start Date End Date Taking? Authorizing Provider  gabapentin (NEURONTIN) 300 MG capsule Take 1 capsule (300 mg total) by mouth 3 (three) times  daily. Patient not taking: Reported on 10/08/2020 09/29/20 10/29/20  Ethelene Hal, NP  ibuprofen (ADVIL) 600 MG tablet Take 1 tablet (600 mg total) by mouth every 6 (six) hours as needed. 10/30/20   Evalee Jefferson, PA-C  metFORMIN (GLUCOPHAGE-XR) 500 MG 24 hr tablet Take 1 tablet (500 mg total) by mouth 2 (two) times daily with a meal. Patient not taking: No sig reported 09/29/20   Ethelene Hal, NP  naloxone [facility]) nasal spray 4 mg/0.1 mL Take in case of overdose Patient not taking: No sig reported 08/07/20   Long, Wonda Olds, MD  nicotine (NICODERM CQ - DOSED IN MG/24 HOURS) 21 mg/24hr patch Place 1 patch (21 mg total) onto the skin daily at 6 (six) AM. Patient not taking: No sig reported 09/30/20   Ethelene Hal, NP  sertraline (ZOLOFT) 50 MG tablet Take 1 tablet (50 mg total) by mouth at bedtime. Patient not taking: No sig reported 09/29/20   Ethelene Hal, NP  traZODone (DESYREL) 50 MG tablet Take 1 tablet (50 mg total) by mouth at bedtime as needed for sleep. Patient not taking: No sig reported 09/29/20   Ethelene Hal, NP    Allergies    Patient has no known allergies.  Review of Systems   Review of Systems  Unable to perform ROS: Acuity of condition   Physical Exam Updated Vital Signs BP 107/81   Pulse 92   Temp 98.2 F (36.8 C) (Oral)   Resp 18   Ht 5\' 3"  (1.6 m)   Wt 68 kg   SpO2 94%   BMI 26.56 kg/m   Physical Exam Vitals and nursing note reviewed.  Constitutional:      Appearance: He is well-developed.     Comments: drowsy  HENT:     Head: Normocephalic.     Comments: Abrasion right forehead    Mouth/Throat:     Mouth: Mucous membranes are dry.  Eyes:     Conjunctiva/sclera: Conjunctivae normal.  Cardiovascular:     Rate and Rhythm: Normal rate and regular rhythm.     Heart sounds: Normal heart sounds.  Pulmonary:     Effort: Pulmonary effort is normal.     Breath sounds: Normal breath sounds. No wheezing.  Abdominal:      General: Bowel sounds are normal.     Palpations: Abdomen is soft.     Tenderness: There is no abdominal tenderness. There is no guarding.  Musculoskeletal:        General: No tenderness. Normal range of motion.     Cervical back: Normal range of motion.  Skin:  General: Skin is warm and dry.  Neurological:     General: No focal deficit present.     Mental Status: He is oriented to person, place, and time and easily aroused. He is lethargic.    ED Results / Procedures / Treatments   Labs (all labs ordered are listed, but only abnormal results are displayed) Labs Reviewed  CBC WITH DIFFERENTIAL/PLATELET - Abnormal; Notable for the following components:      Result Value   WBC 10.7 (*)    Neutro Abs 9.6 (*)    All other components within normal limits  COMPREHENSIVE METABOLIC PANEL - Abnormal; Notable for the following components:   CO2 21 (*)    Glucose, Bld 200 (*)    BUN 30 (*)    Creatinine, Ser 1.41 (*)    Calcium 8.4 (*)    AST 442 (*)    ALT 418 (*)    All other components within normal limits  RAPID URINE DRUG SCREEN, HOSP PERFORMED - Abnormal; Notable for the following components:   Benzodiazepines POSITIVE (*)    Amphetamines POSITIVE (*)    All other components within normal limits  CK - Abnormal; Notable for the following components:   Total CK 836 (*)    All other components within normal limits  ACETAMINOPHEN LEVEL - Abnormal; Notable for the following components:   Acetaminophen (Tylenol), Serum <10 (*)    All other components within normal limits  SALICYLATE LEVEL - Abnormal; Notable for the following components:   Salicylate Lvl [id number] (*)    All other components within normal limits  CBG MONITORING, ED - Abnormal; Notable for the following components:   Glucose-Capillary 304 (*)    All other components within normal limits    EKG None  Radiology No results found.  Procedures Procedures   Medications Ordered in ED Medications  naloxone  (NARCAN) nasal spray 4 mg/0.1 mL (has no administration in time range)  sodium chloride 0.9 % bolus 1,000 mL (0 mLs Intravenous Stopped 12/07/20 1150)  sodium chloride 0.9 % bolus 1,000 mL (0 mLs Intravenous Stopped 12/07/20 1345)    ED Course  I have reviewed the triage vital signs and the nursing notes.  Pertinent labs & imaging results that were available during my care of the patient were reviewed by me and considered in my medical decision making (see chart for details).  Clinical Course as of 12/07/20 1719  Sun Dec 07, 2020  1135 Pt awake, complaining of thirst.  VSS.   [JI]  [id number] Pt awake again, thirsty, hungry.  Also more lucid and able to answer questions.  He denies suicidal ideation or intent.  He reports he "slipped" up with his addiction and endorses shooting up fentanyl, denies any other intake.  He states that the pastor of his mothers church is supportively helping him and is helping to get him placed at "[facility]" in [place] which is a substance abuse tx center (? Inpatient).    He has ate, drank here, ambulated, awake and safe for dc home.  [JI]    Clinical Course User Index [JI] Landis Martins   MDM Rules/Calculators/A&P                           At time of dc, RN informed me that he expressed suicidal thoughts to EMT, stating he would find a way to kill himself when he went home.  RN Stanton Kidney and I spoke with pt  again, he denies being suicidal today, but stated that if something "happened" like he wasn't allowed to see his grandkids, for example, he would consider suicide, but it "wouldn't be messy"    Given statements along with 2 overdose presentations this month, pt has been IVC'd for further assessement by TTS.  He has been medically cleared.    Final Clinical Impression(s) / ED Diagnoses Final diagnoses:  Opiate overdose, accidental or unintentional, initial encounter [facility])  Suicidal ideation    Rx / DC Orders ED Discharge Orders     None        Victoriano Lain 12/07/20 1917    Benjiman Core, MD 12/09/20 [phone]

## 2020-12-07 NOTE — ED Notes (Signed)
IVC faxed to magistrate.

## 2020-12-07 NOTE — ED Notes (Signed)
Pt able to keep fluids down. Given crackers as well and has kept them down. Pt ambulated with steady gait independently.

## 2020-12-07 NOTE — ED Triage Notes (Addendum)
Pt arrived by [facility]. Pt overdosed at home. Family gave 8 of narcan before EMS arrived. CBG >500 w/ EMS.

## 2020-12-08 ENCOUNTER — Inpatient Hospital Stay (HOSPITAL_COMMUNITY)
Admission: AD | Admit: 2020-12-08 | Discharge: 2020-12-15 | DRG: 885 | Disposition: A | Discharge status: Home / Self Care | Payer: 59 | Source: Intra-hospital | Attending: Psychiatry | Admitting: Psychiatry

## 2020-12-08 ENCOUNTER — Encounter (HOSPITAL_COMMUNITY): Payer: Self-pay | Admitting: Psychiatry

## 2020-12-08 ENCOUNTER — Other Ambulatory Visit: Payer: Self-pay

## 2020-12-08 DIAGNOSIS — E119 Type 2 diabetes mellitus without complications: Secondary | ICD-10-CM | POA: Diagnosis present

## 2020-12-08 DIAGNOSIS — F332 Major depressive disorder, recurrent severe without psychotic features: Secondary | ICD-10-CM | POA: Diagnosis not present

## 2020-12-08 DIAGNOSIS — J449 Chronic obstructive pulmonary disease, unspecified: Secondary | ICD-10-CM | POA: Diagnosis not present

## 2020-12-08 DIAGNOSIS — G4700 Insomnia, unspecified: Secondary | ICD-10-CM | POA: Diagnosis present

## 2020-12-08 DIAGNOSIS — G8929 Other chronic pain: Secondary | ICD-10-CM | POA: Diagnosis not present

## 2020-12-08 DIAGNOSIS — Z8711 Personal history of peptic ulcer disease: Secondary | ICD-10-CM | POA: Diagnosis not present

## 2020-12-08 DIAGNOSIS — F17210 Nicotine dependence, cigarettes, uncomplicated: Secondary | ICD-10-CM | POA: Diagnosis present

## 2020-12-08 DIAGNOSIS — I10 Essential (primary) hypertension: Secondary | ICD-10-CM | POA: Diagnosis present

## 2020-12-08 DIAGNOSIS — F4310 Post-traumatic stress disorder, unspecified: Secondary | ICD-10-CM | POA: Diagnosis not present

## 2020-12-08 DIAGNOSIS — F4010 Social phobia, unspecified: Secondary | ICD-10-CM | POA: Diagnosis present

## 2020-12-08 DIAGNOSIS — F909 Attention-deficit hyperactivity disorder, unspecified type: Secondary | ICD-10-CM | POA: Diagnosis present

## 2020-12-08 DIAGNOSIS — F1120 Opioid dependence, uncomplicated: Secondary | ICD-10-CM

## 2020-12-08 DIAGNOSIS — Z79899 Other long term (current) drug therapy: Secondary | ICD-10-CM | POA: Diagnosis not present

## 2020-12-08 DIAGNOSIS — B1920 Unspecified viral hepatitis C without hepatic coma: Secondary | ICD-10-CM

## 2020-12-08 DIAGNOSIS — F1994 Other psychoactive substance use, unspecified with psychoactive substance-induced mood disorder: Secondary | ICD-10-CM | POA: Diagnosis present

## 2020-12-08 DIAGNOSIS — F1590 Other stimulant use, unspecified, uncomplicated: Secondary | ICD-10-CM | POA: Diagnosis not present

## 2020-12-08 DIAGNOSIS — F1190 Opioid use, unspecified, uncomplicated: Secondary | ICD-10-CM | POA: Diagnosis present

## 2020-12-08 DIAGNOSIS — Z23 Encounter for immunization: Secondary | ICD-10-CM

## 2020-12-08 DIAGNOSIS — K219 Gastro-esophageal reflux disease without esophagitis: Secondary | ICD-10-CM | POA: Diagnosis present

## 2020-12-08 DIAGNOSIS — F314 Bipolar disorder, current episode depressed, severe, without psychotic features: Secondary | ICD-10-CM | POA: Diagnosis not present

## 2020-12-08 DIAGNOSIS — T1490XA Injury, unspecified, initial encounter: Secondary | ICD-10-CM

## 2020-12-08 DIAGNOSIS — B182 Chronic viral hepatitis C: Secondary | ICD-10-CM | POA: Diagnosis not present

## 2020-12-08 DIAGNOSIS — F1110 Opioid abuse, uncomplicated: Secondary | ICD-10-CM | POA: Diagnosis present

## 2020-12-08 LAB — COMPREHENSIVE METABOLIC PANEL
ALT: 330 U/L — ABNORMAL HIGH (ref 0–44)
AST: 195 U/L — ABNORMAL HIGH (ref 15–41)
Albumin: 3.2 g/dL — ABNORMAL LOW (ref 3.5–5.0)
Alkaline Phosphatase: 49 U/L (ref 38–126)
Anion gap: 4 — ABNORMAL LOW (ref 5–15)
BUN: 15 mg/dL (ref 6–20)
CO2: 28 mmol/L (ref 22–32)
Calcium: 8.6 mg/dL — ABNORMAL LOW (ref 8.9–10.3)
Chloride: 106 mmol/L (ref 98–111)
Creatinine, Ser: 0.95 mg/dL (ref 0.61–1.24)
GFR, Estimated: >60 mL/min (ref >60)
Glucose, Bld: 92 mg/dL (ref 70–99)
Potassium: 3.9 mmol/L (ref 3.5–5.1)
Sodium: 138 mmol/L (ref 135–145)
Total Bilirubin: 0.4 mg/dL (ref 0.3–1.2)
Total Protein: 6.4 g/dL — ABNORMAL LOW (ref 6.5–8.1)

## 2020-12-08 LAB — RESP PANEL BY RT-PCR (FLU A&B, COVID) ARPGX2
Influenza A by PCR: NEGATIVE
Influenza B by PCR: NEGATIVE
SARS Coronavirus 2 by RT PCR: NEGATIVE

## 2020-12-08 LAB — CK: Total CK: 132 U/L (ref 49–397)

## 2020-12-08 LAB — GLUCOSE, CAPILLARY: Glucose-Capillary: 96 mg/dL (ref 70–99)

## 2020-12-08 MED ORDER — TRAZODONE HCL 50 MG PO TABS
50.0000 mg | ORAL_TABLET | Freq: Every evening | ORAL | Status: DC | PRN
  Administered 2020-12-08 – 2020-12-14 (×3): 50 mg via ORAL
  Filled 2020-12-08 (×6): qty 1

## 2020-12-08 MED ORDER — CLONIDINE HCL 0.1 MG PO TABS
0.1000 mg | ORAL_TABLET | ORAL | Status: AC
  Administered 2020-12-11 – 2020-12-13 (×4): 0.1 mg via ORAL
  Filled 2020-12-08 (×4): qty 1

## 2020-12-08 MED ORDER — HYDROXYZINE HCL 25 MG PO TABS
25.0000 mg | ORAL_TABLET | Freq: Four times a day (QID) | ORAL | Status: AC | PRN
  Administered 2020-12-08 – 2020-12-11 (×3): 25 mg via ORAL
  Filled 2020-12-08 (×5): qty 1

## 2020-12-08 MED ORDER — INFLUENZA VAC SPLIT QUAD 0.5 ML IM SUSY
0.5000 mL | PREFILLED_SYRINGE | INTRAMUSCULAR | Status: AC
  Administered 2020-12-09: 0.5 mL via INTRAMUSCULAR
  Filled 2020-12-08: qty 0.5

## 2020-12-08 MED ORDER — NAPROXEN 500 MG PO TABS
500.0000 mg | ORAL_TABLET | Freq: Two times a day (BID) | ORAL | Status: AC | PRN
  Administered 2020-12-10 – 2020-12-13 (×6): 500 mg via ORAL
  Filled 2020-12-08 (×7): qty 1

## 2020-12-08 MED ORDER — CLONIDINE HCL 0.1 MG PO TABS
0.1000 mg | ORAL_TABLET | Freq: Four times a day (QID) | ORAL | Status: AC
  Administered 2020-12-08 – 2020-12-11 (×10): 0.1 mg via ORAL
  Filled 2020-12-08 (×11): qty 1

## 2020-12-08 MED ORDER — MAGNESIUM HYDROXIDE 400 MG/5ML PO SUSP
30.0000 mL | Freq: Every day | ORAL | Status: DC | PRN
  Administered 2020-12-13: 30 mL via ORAL
  Filled 2020-12-08: qty 30

## 2020-12-08 MED ORDER — ALUM & MAG HYDROXIDE-SIMETH 200-200-20 MG/5ML PO SUSP: 30.0000 mL | ORAL | Status: DC | PRN

## 2020-12-08 MED ORDER — SERTRALINE HCL 50 MG PO TABS
50.0000 mg | ORAL_TABLET | Freq: Every day | ORAL | Status: DC
  Administered 2020-12-09 – 2020-12-11 (×3): 50 mg via ORAL
  Filled 2020-12-08 (×4): qty 1

## 2020-12-08 MED ORDER — NICOTINE 14 MG/24HR TD PT24
14.0000 mg | MEDICATED_PATCH | Freq: Every day | TRANSDERMAL | Status: DC
  Administered 2020-12-09: 14 mg via TRANSDERMAL
  Filled 2020-12-08 (×3): qty 1

## 2020-12-08 MED ORDER — DICYCLOMINE HCL 20 MG PO TABS
20.0000 mg | ORAL_TABLET | Freq: Four times a day (QID) | ORAL | Status: AC | PRN
  Administered 2020-12-08 – 2020-12-12 (×2): 20 mg via ORAL
  Filled 2020-12-08 (×2): qty 1

## 2020-12-08 MED ORDER — CLONIDINE HCL 0.1 MG PO TABS
0.1000 mg | ORAL_TABLET | Freq: Every day | ORAL | Status: AC
  Administered 2020-12-14 – 2020-12-15 (×2): 0.1 mg via ORAL
  Filled 2020-12-08 (×2): qty 1

## 2020-12-08 MED ORDER — METHOCARBAMOL 500 MG PO TABS
500.0000 mg | ORAL_TABLET | Freq: Three times a day (TID) | ORAL | Status: AC | PRN
  Administered 2020-12-08 – 2020-12-13 (×8): 500 mg via ORAL
  Filled 2020-12-08 (×9): qty 1

## 2020-12-08 MED ORDER — ACETAMINOPHEN 325 MG PO TABS
650.0000 mg | ORAL_TABLET | Freq: Four times a day (QID) | ORAL | Status: DC | PRN
  Administered 2020-12-08 – 2020-12-14 (×2): 650 mg via ORAL
  Filled 2020-12-08 (×2): qty 2

## 2020-12-08 MED ORDER — LOPERAMIDE HCL 2 MG PO CAPS
2.0000 mg | ORAL_CAPSULE | ORAL | Status: AC | PRN
Start: 2020-12-08 — End: 2020-12-13

## 2020-12-08 MED ORDER — ONDANSETRON 4 MG PO TBDP
4.0000 mg | ORAL_TABLET | Freq: Four times a day (QID) | ORAL | Status: AC | PRN
  Administered 2020-12-12: 4 mg via ORAL
  Filled 2020-12-08: qty 1

## 2020-12-08 NOTE — Progress Notes (Signed)
   12/08/20 2034  Psych Admission Type (Psych Patients Only)  Admission Status Involuntary  Psychosocial Assessment  Patient Complaints Anxiety;Depression;Insomnia;Restlessness;Substance abuse;Shakiness;Hopelessness  Eye Contact Brief  Facial Expression Anxious  Affect Anxious  Speech Logical/coherent  Interaction Assertive  Motor Activity Slow  Appearance/Hygiene In scrubs  Behavior Characteristics Cooperative;Anxious  Mood Anxious;Depressed;Pleasant  Thought Process  Coherency WDL  Content WDL  Delusions None reported or observed  Perception WDL  Hallucination None reported or observed  Judgment Poor  Confusion None  Danger to Self  Current suicidal ideation? Denies  Danger to Others  Danger to Others None reported or observed   Pt given PRNs for his withdrawal symptoms. Pt cooperative with EKG and blood sugar check. Pt just wants to rest.

## 2020-12-08 NOTE — Progress Notes (Addendum)
Pt accepted to [facility] today 12/08/20; Bed Assignment 307-1.  CSW informed that pt's bloodwork has improved and patient can come as soon as COVID-19 results as back and negative per [facility] AC, Malva Limes, RN.  Louanne Belton, RN reported that pt's flu and COVID-19 results are negative and shared that she would fax the paperwork over along with calling report.   Maryjean Ka, MSW, [facility] 12/08/2020 4:43 PM

## 2020-12-08 NOTE — Progress Notes (Signed)
Inpatient Diabetes Program Recommendations  AACE/ADA: New Consensus Statement on Inpatient Glycemic Control (2015)  Target Ranges:  Prepandial:   less than 140 mg/dL      Peak postprandial:   less than 180 mg/dL (1-2 hours)      Critically ill patients:  140 - 180 mg/dL   Lab Results  Component Value Date   GLUCAP 304 (H) 12/07/2020   HGBA1C 6.9 (H) 10/08/2020    Review of Glycemic Control  Latest Reference Range & Units 12/07/20 11:04  Glucose 70 - 99 mg/dL 836 (H)   Diabetes history: DM 2 Outpatient Diabetes medications: Metformin 500 mg bid in the past listed as not taking currently Current orders for Inpatient glycemic control: None  Inpatient Diabetes Program Recommendations:    Glucose 309 this am -  start CBGs and Novolog 0-15 units tid.   -  Pt on metformin in the past, could also order Metformin 500 mg bid if pt does not report side effects to medication.  Thanks,  Christena Deem RN, MSN, BC-ADM Inpatient Diabetes Coordinator Team Pager [phone] (8a-5p)

## 2020-12-08 NOTE — ED Notes (Signed)
Breakfast tray placed on bedside table. Resting at this time. 

## 2020-12-08 NOTE — Progress Notes (Signed)
Patient's most recent 12/08/20 EKG reviewed with Dr. Deretha [facility] ED) via phone, who confirmed that this EKG shows no acute/concerning findings.

## 2020-12-08 NOTE — ED Notes (Signed)
Pt completing TTS assessment. 

## 2020-12-08 NOTE — Progress Notes (Addendum)
ADDENDUM   Pt accepted to  [facility] rm 307-1 pending negative COVID and improved repeat labs.  Patient meets inpatient criteria per Elta Guadeloupe, NP  The attending provider will be Phineas Inches, MD  Call report to [phone]    Louanne Belton, RN @ [facility] ED notified.      Signed:  Corky Crafts, MSW, [place], LCASA 12/08/2020 2:10 PM  Patient information has been sent to [facility] via secure chat to review for potential admission. Patient has not yet been accepted at this time. Patient meets inpatient criteria per Elta Guadeloupe, NP.   Situation ongoing, CSW will continue to monitor and update note as more information becomes available.    Signed:  Corky Crafts, MSW, [place], LCASA 12/08/2020 1:59 PM

## 2020-12-08 NOTE — ED Notes (Signed)
Pt ambulated to bathroom 

## 2020-12-08 NOTE — Consult Note (Addendum)
Telepsych Consultation   Reason for Consult:  Opioid overdose Referring Physician:  Burgess Amor, PA-C [facility] Location of [facility]: [facility]  Location of Provider: [facility]  Patient Identification: Joshua Thomas MRN:  [id number] Principal Diagnosis: <principal problem not specified> Diagnosis:  Active Problems:   * No active hospital problems. *   Total Time spent with patient: 20 minutes  Subjective:   Joshua Thomas is a 45 y.o. male patient admitted with opioid overdose. Patient was seen, chart reviewed and case discussed with Dr Hedy Camara. Patient stated he was using IV fentanyl and overdosed. He stated "I wasn't trying to kill myself but if it happens, it happens." Patient has a history of chronic opioid abuse, PTSD, hepatitis C, DM2, bipolar disorder, and depression. Patient was found unresponsive at home, by his mother. His brother is an EMT and gave him Narcan 8 mg. He stated he has been living with his mother off and on his entire life. He stated he was recently at [facility] and was prescribed Zoloft, Depakote, and Seroquel. He is unclear on whether or not he has been taking these medications every day. He stated "I read some of the medicines cause you to shake so I didn't want to take them." Patient stated he has suicidal thoughts every day. He stated he wants to stop using. He endorses depressive symptoms of guilt, feeling hopeless, helpless and worthless. He stated he feels that if he died from an overdose it "would not be messy." He denies HI/AVH, paranoia and delusional thinking. He does not appear to be responding to internal stimuli. He stated "If you let me go today, I will use and likely I will die." Patient appears anxious, sad and depressed. He denies withdrawal symptoms at present. His vital signs are stable; BP 120/85, pulse 87.  Patient is a high risk for suicide, accidental or intentional. Patient is recommended for inpatient psychiatric hospitalization. Dr  Rubin Payor, Tomasita Crumble, RN and TTS team made aware via secure chat.    Past Psychiatric History: hronic opioid abuse, PTSD, hepatitis C, DM2, bipolar disorder, and depression.  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   COPD (chronic obstructive pulmonary disease) (HCC)    DDD (degenerative disc disease), lumbar    Degenerative disc disease, lumbar    Depression    Discitis    GERD (gastroesophageal reflux disease)    Hepatitis C    Hepatitis C    History of stomach ulcers    Hx of diabetes mellitus    due to infection   Hypertension    Lung nodules    3 on L and 2 on R   PTSD (post-traumatic stress disorder)    Sciatica    Substance abuse (HCC)     Past Surgical History:  Procedure Laterality Date   FRACTURE SURGERY     HARDWARE REMOVAL  01/27/2012   Procedure: HARDWARE REMOVAL;  Surgeon: Kathryne Hitch, MD;  Location: [facility] OR[facility];  Service: Orthopedics;  Laterality: Left;   IR LUMBAR DISC ASPIRATION W/IMG GUIDE  06/08/2017   RADIOLOGY WITH ANESTHESIA N/A 06/08/2017  Procedure: DISC ASPIRATION;  Surgeon: Julieanne Cotton, MD;  Location: [facility] OR;  Service: Radiology;  Laterality: N/A;   TIBIA IM NAIL INSERTION  01/27/2012   Procedure: INTRAMEDULLARY (IM) NAIL TIBIAL;  Surgeon: Kathryne Hitch, MD;  Location: [facility];  Service: Orthopedics;  Laterality: Left;  Removal of IM Rod and Screws Left Tibia with Exchange Nail, Allograft Bone Graft Left Tibia   Family History:  Family History  Problem Relation Age of Onset   Diabetes Mother    Alcohol abuse Father    Cirrhosis Father    Aneurysm Father    Family Psychiatric  History:  Unknown Social History:  Social History   Substance and Sexual Activity  Alcohol Use Yes   Alcohol/week: 1.0 standard drink   Types: 1 Standard drinks or equivalent per week   Comment: occasionally     Social History   Substance and Sexual Activity  Drug Use Yes   Types: IV    Social History   Socioeconomic History   Marital status: Single    Spouse name: Not on file   Number of children: Not on file   Years of education: 12   Highest education level: High school graduate  Occupational History   Not on file  Tobacco Use   Smoking status: Every Day    Packs/day: 1.00    Years: 33.00    Pack years: 33.00    Types: Cigarettes   Smokeless tobacco: Former    Quit date: 08/08/2006   Tobacco comments:    1 PPD  Substance and Sexual Activity   Alcohol use: Yes    Alcohol/week: 1.0 standard drink    Types: 1 Standard drinks or equivalent per week    Comment: occasionally   Drug use: Yes    Types: IV   Sexual activity: Not Currently  Other Topics Concern   Not on file  Social History Narrative   Not on file   Social Determinants of Health   Financial Resource Strain: Not on file  Food Insecurity: Not on file  Transportation Needs: Not on file  Physical Activity: Not on file  Stress: Not on file  Social Connections: Not on file   Additional Social History:    Allergies:  No Known Allergies  Labs:  Results for orders placed or performed during the hospital encounter of 12/07/20 (from the past 48 hour(s))  Rapid urine drug screen (hospital performed)     Status: Abnormal   Collection Time: 12/07/20 10:04 AM  Result Value Ref Range   Opiates NONE DETECTED NONE DETECTED   Cocaine NONE DETECTED NONE DETECTED   Benzodiazepines POSITIVE (A) NONE DETECTED   Amphetamines POSITIVE (A) NONE DETECTED   Tetrahydrocannabinol NONE DETECTED NONE DETECTED   Barbiturates NONE DETECTED NONE DETECTED    Comment: (NOTE) DRUG SCREEN FOR MEDICAL PURPOSES ONLY.  IF  CONFIRMATION IS NEEDED FOR ANY PURPOSE, NOTIFY LAB WITHIN 5 DAYS.  LOWEST DETECTABLE LIMITS FOR URINE DRUG SCREEN Drug Class                     Cutoff (ng/mL) Amphetamine and metabolites    1000 Barbiturate and metabolites    200 Benzodiazepine                 200 Tricyclics and metabolites     300 Opiates and metabolites        300 Cocaine and metabolites        300 THC  50 Performed at [facility], [address], [place], [place] [address]   POC CBG, ED     Status: Abnormal   Collection Time: 12/07/20 10:10 AM  Result Value Ref Range   Glucose-Capillary 304 (H) 70 - 99 mg/dL    Comment: Glucose reference range applies only to samples taken after fasting for at least 8 hours.  CBC with Differential/Platelet     Status: Abnormal   Collection Time: 12/07/20 11:04 AM  Result Value Ref Range   WBC 10.7 (H) 4.0 - 10.5 K/uL   RBC 4.69 4.22 - 5.81 MIL/uL   Hemoglobin 14.5 13.0 - 17.0 g/dL   HCT 19.6 22.2 - 97.9 %   MCV 97.9 80.0 - 100.0 fL   MCH 30.9 26.0 - 34.0 pg   MCHC 31.6 30.0 - 36.0 g/dL   RDW 89.2 11.9 - 41.7 %   Platelets 163 150 - 400 K/uL   nRBC 0.0 0.0 - 0.2 %   Neutrophils Relative % 89 %   Neutro Abs 9.6 (H) 1.7 - 7.7 K/uL   Lymphocytes Relative 6 %   Lymphs Abs 0.7 0.7 - 4.0 K/uL   Monocytes Relative 3 %   Monocytes Absolute 0.3 0.1 - 1.0 K/uL   Eosinophils Relative 1 %   Eosinophils Absolute 0.1 0.0 - 0.5 K/uL   Basophils Relative 0 %   Basophils Absolute 0.0 0.0 - 0.1 K/uL   Immature Granulocytes 1 %   Abs Immature Granulocytes 0.05 0.00 - 0.07 K/uL    Comment: Performed at [facility], [address], [place], [place] [address]  Comprehensive metabolic panel     Status: Abnormal   Collection Time: 12/07/20 11:04 AM  Result Value Ref Range   Sodium 139 135 - 145 mmol/L   Potassium 4.4 3.5 - 5.1 mmol/L   Chloride 105 98 - 111 mmol/L   CO2 21 (L) 22 - 32 mmol/L   Glucose, Bld 200 (H) 70 - 99 mg/dL    Comment: Glucose reference  range applies only to samples taken after fasting for at least 8 hours.   BUN 30 (H) 6 - 20 mg/dL   Creatinine, Ser 4.81 (H) 0.61 - 1.24 mg/dL   Calcium 8.4 (L) 8.9 - 10.3 mg/dL   Total Protein 7.6 6.5 - 8.1 g/dL   Albumin 4.0 3.5 - 5.0 g/dL   AST 856 (H) 15 - 41 U/L   ALT 418 (H) 0 - 44 U/L   Alkaline Phosphatase 80 38 - 126 U/L   Total Bilirubin 0.3 0.3 - 1.2 mg/dL   GFR, Estimated >31 >49 mL/min    Comment: (NOTE) Calculated using the CKD-EPI Creatinine Equation (2021)    Anion gap 13 5 - 15    Comment: Performed at [facility], [address], [place], [place] [address]  CK     Status: Abnormal   Collection Time: 12/07/20 11:04 AM  Result Value Ref Range   Total CK 836 (H) 49 - 397 U/L    Comment: Performed at [facility], [address] [place], [place] [address]  Acetaminophen level     Status: Abnormal   Collection Time: 12/07/20 11:04 AM  Result Value Ref Range   Acetaminophen (Tylenol), Serum <10 (L) 10 - 30 ug/mL    Comment: (NOTE) Therapeutic concentrations vary significantly. A range of 10-30 ug/mL  may be an effective concentration for many patients. However, some  are best treated at concentrations outside of this range. Acetaminophen concentrations >150 ug/mL at  4 hours after ingestion  and >50 ug/mL at 12 hours after ingestion are often associated with  toxic reactions.  Performed at [facility], [address], [place], [place] [address]   Salicylate level     Status: Abnormal   Collection Time: 12/07/20 11:04 AM  Result Value Ref Range   Salicylate Lvl <7.0 (L) 7.0 - 30.0 mg/dL    Comment: Performed at [facility], [address], [place], [place] [address]    Medications:  Current Facility-Administered Medications  Medication Dose Route Frequency Provider Last Rate Last Admin   naloxone (NARCAN) nasal spray 4 mg/0.1 mL  1 spray Nasal PRN Burgess Amor, PA-C       nicotine (NICODERM CQ - dosed in mg/24 hours) patch 21 mg  21 mg Transdermal Daily Burgess Amor, PA-C   21 mg at 12/07/20 1824   ondansetron (ZOFRAN) tablet 4 mg  4 mg Oral Q8H PRN Burgess Amor, PA-C       Current Outpatient Medications  Medication Sig Dispense Refill   gabapentin (NEURONTIN) 300 MG capsule Take 1 capsule (300 mg total) by mouth 3 (three) times daily. 90 capsule 0   ibuprofen (ADVIL) 600 MG tablet Take 1 tablet (600 mg total) by mouth every 6 (six) hours as needed. 30 tablet 0   naloxone (NARCAN) nasal spray 4 mg/0.1 mL Take in case of overdose 1 each 0   QUEtiapine (SEROQUEL) 50 MG tablet Take 50 mg by mouth at bedtime.     sertraline (ZOLOFT) 100 MG tablet Take 100 mg by mouth at bedtime.     metFORMIN (GLUCOPHAGE-XR) 500 MG 24 hr tablet Take 1 tablet (500 mg total) by mouth 2 (two) times daily with a meal. (Patient not taking: Reported on 10/08/2020) 60 tablet 0   nicotine (NICODERM CQ - DOSED IN MG/24 HOURS) 21 mg/24hr patch Place 1 patch (21 mg total) onto the skin daily at 6 (six) AM. (Patient not taking: Reported on 10/08/2020) 28 patch 0   sertraline (ZOLOFT) 50 MG tablet Take 1 tablet (50 mg total) by mouth at bedtime. (Patient not taking: Reported on 10/08/2020) 30 tablet 0   traZODone (DESYREL) 50 MG tablet Take 1 tablet (50 mg total) by mouth at bedtime as needed for sleep. (Patient not taking: Reported on 10/08/2020) 30 tablet 0    Musculoskeletal: Strength & Muscle Tone: within normal limits Gait & Station: normal Patient leans: N/A  Psychiatric Specialty Exam:  Presentation  General Appearance: Disheveled  Eye Contact:Fair  Speech:Clear and Coherent  Speech Volume:Normal  Handedness:Right   Mood and Affect  Mood:Anxious; Depressed; Hopeless; Worthless  Affect:Congruent; Depressed   Thought Process  Thought Processes:Coherent; Linear  Descriptions of Associations:Intact  Orientation:Full (Time, Place and Person)  Thought Content:Logical  History of Schizophrenia/Schizoaffective disorder:No  Duration of Psychotic Symptoms:No  data recorded Hallucinations:Hallucinations: None Ideas of Reference:None  Suicidal Thoughts:Suicidal Thoughts: Yes, Passive SI Passive Intent and/or Plan: Without Intent; With Means to Carry Out; With Access to Means (patient has chronic suicidal ideation, has access and means to carry out by either by accidental or intentional opioid overdose) Homicidal Thoughts:Homicidal Thoughts: No  Sensorium  Memory:Immediate Fair; Recent Fair; Remote Fair  Judgment:Poor  Insight:Fair   Executive Functions  Concentration:Fair  Attention Span:Fair  Recall:Fair  Fund of Knowledge:Fair  Language:Good   Psychomotor Activity  Psychomotor Activity:Psychomotor Activity: Normal  Assets  Assets:Communication Skills; Desire for Improvement; Social Support; Resilience; Financial Resources/Insurance   Sleep  Sleep:No data recorded   Physical Exam: Physical Exam  Vitals and nursing note reviewed.  HENT:     Head: Normocephalic and atraumatic.  Musculoskeletal:        General: Normal range of motion.     Cervical back: Normal range of motion.  Neurological:     Mental Status: He is alert and oriented to person, place, and time.  Psychiatric:        Attention and Perception: Attention normal. He does not perceive auditory or visual hallucinations.        Mood and Affect: Mood is anxious and depressed.        Speech: Speech normal.        Behavior: Behavior normal. Behavior is cooperative.        Thought Content: Thought content is not paranoid or delusional. Thought content includes suicidal ideation. Thought content does not include homicidal ideation. Thought content does not include homicidal or suicidal plan.   Review of Systems  Constitutional:  Negative for fever.  HENT:  Negative for congestion, sinus pain and sore throat.   Respiratory:  Negative for cough and shortness of breath.   Cardiovascular:  Negative for chest pain.  Gastrointestinal: Negative.   Neurological:  Negative.   Psychiatric/Behavioral:  Positive for depression and suicidal ideas. The patient is nervous/anxious.    Blood pressure 120/85, pulse 87, temperature 98.2 F (36.8 C), temperature source Oral, resp. rate 16, height 5\' 3"  (1.6 m), weight 68 kg, SpO2 100 %. Body mass index is 26.56 kg/m.  Treatment Plan Summary: Daily contact with patient to assess and evaluate symptoms and progress in treatment, Medication management, and Plan Admit to inpatient psychiatric hospital. TTS to seek bed placement.   Disposition: Recommend psychiatric Inpatient admission when medically cleared.  This service was provided via telemedicine using a 2-way, interactive audio and video technology.  Names of all persons participating in this telemedicine service and their role in this encounter. Name: Duanne Moron Role: Patient  Name: Elta Guadeloupe Role: PMHNP-BC  Name: Earlene Plater Role: MD/Psychiatrist  Name: Role:     Laveda Abbe, NP 12/08/2020 1:28 PM

## 2020-12-08 NOTE — Progress Notes (Signed)
Patient ID: Joshua Thomas, male   DOB: February 12, 1975, 45 y.o.   MRN: [id number]  D: Pt here IVC from [facility]. Pt denies SI/HI/AVH at this time. Pt rates pain 7/10 as low back pain and stomach cramping. Pt Od'd on fentanyl yesterday. Pt endorses daily fentanyl use. Pt states he was not trying to commit suicide. "I wasn't trying to kill myself. I've always been taught that it's a mortal sin to take your life. I'm tired. If I go, I go, but I'm still here right now." Pt lives with his mother but doesn't know if he can return because of his substance abuse. "She didn't say I couldn't come back but not like this." Pt also uses Xanax off the street. Pt endorses feelings of anxiety, depression, hopelessness, restlessness, sadness, shakiness and insomnia.   Pt says that the last use of his prescription meds was a week ago. "I just got out of another facility." Pt couldn't remember name. Pt lacks the means to purchase and transportation to get any more meds. "I'd have to get a ride to go to appointments." Pt says he was taking Zoloft, Depakote and Seroquel but hasn't used them since his last discharge. Pt endorses the following withdrawal symptoms: cold sweats, shakiness, stuffy nose, stomach cramps, stomach upset and pain.   A: Pt was offered support and encouragement. Pt is cooperative during assessment. VS assessed and admission paperwork signed. Belongings searched and contraband items placed in locker. Non-invasive skin search completed: Pt has abrasion to right forehead he presumes is from a fall. Pt also has abrasions on top of head he says are from cyst bumps. All are healed. Pt offered food and drink and both accepted. Pt introduced to unit milieu by nursing staff. Q 15 minute checks were started for safety.   R: Pt in room. Pt safety maintained on unit.

## 2020-12-08 NOTE — ED Notes (Signed)
Pt ate peanut butter, crackers and was given drink.

## 2020-12-08 NOTE — Tx Team (Signed)
Initial Treatment Plan 12/08/2020 11:23 PM [name]:[id number]    PATIENT STRESSORS: Substance abuse     PATIENT STRENGTHS: Active sense of humor  Average or above average intelligence  Supportive family/friends    PATIENT IDENTIFIED PROBLEMS: OD-fentanyl  Substance abuse  Medication non-compliance (ran out of meds)  Anxiety  Depression  ("Want to work on getting back on meds")           DISCHARGE CRITERIA:  Adequate post-discharge living arrangements Improved stabilization in mood, thinking, and/or behavior Verbal commitment to aftercare and medication compliance  PRELIMINARY DISCHARGE PLAN: Attend aftercare/continuing care group Outpatient therapy Placement in alternative living arrangements Return to previous living arrangement  PATIENT/FAMILY INVOLVEMENT: This treatment plan has been presented to and reviewed with the patient, Joshua Thomas, and/or family member.  The patient and family have been given the opportunity to ask questions and make suggestions.  Victorino December, RN 12/08/2020, 11:23 PM

## 2020-12-09 LAB — CBC WITH DIFFERENTIAL/PLATELET
Abs Immature Granulocytes: 0.01 10*3/uL (ref 0.00–0.07)
Basophils Absolute: 0 10*3/uL (ref 0.0–0.1)
Basophils Relative: 0 %
Eosinophils Absolute: 0.1 10*3/uL (ref 0.0–0.5)
Eosinophils Relative: 3 %
HCT: 41.4 % (ref 39.0–52.0)
Hemoglobin: 13.1 g/dL (ref 13.0–17.0)
Immature Granulocytes: 0 %
Lymphocytes Relative: 45 %
Lymphs Abs: 1.7 10*3/uL (ref 0.7–4.0)
MCH: 30.9 pg (ref 26.0–34.0)
MCHC: 31.6 g/dL (ref 30.0–36.0)
MCV: 97.6 fL (ref 80.0–100.0)
Monocytes Absolute: 0.3 10*3/uL (ref 0.1–1.0)
Monocytes Relative: 8 %
Neutro Abs: 1.6 10*3/uL — ABNORMAL LOW (ref 1.7–7.7)
Neutrophils Relative %: 44 %
Platelets: 146 10*3/uL — ABNORMAL LOW (ref 150–400)
RBC: 4.24 MIL/uL (ref 4.22–5.81)
RDW: 14.4 % (ref 11.5–15.5)
WBC: 3.7 10*3/uL — ABNORMAL LOW (ref 4.0–10.5)
nRBC: 0 % (ref 0.0–0.2)

## 2020-12-09 LAB — RPR: RPR Ser Ql: NONREACTIVE

## 2020-12-09 LAB — COMPREHENSIVE METABOLIC PANEL
ALT: 272 U/L — ABNORMAL HIGH (ref 0–44)
AST: 116 U/L — ABNORMAL HIGH (ref 15–41)
Albumin: 3.4 g/dL — ABNORMAL LOW (ref 3.5–5.0)
Alkaline Phosphatase: 46 U/L (ref 38–126)
Anion gap: 6 (ref 5–15)
BUN: 14 mg/dL (ref 6–20)
CO2: 24 mmol/L (ref 22–32)
Calcium: 8.4 mg/dL — ABNORMAL LOW (ref 8.9–10.3)
Chloride: 109 mmol/L (ref 98–111)
Creatinine, Ser: 0.74 mg/dL (ref 0.61–1.24)
GFR, Estimated: >60 mL/min (ref >60)
Glucose, Bld: 121 mg/dL — ABNORMAL HIGH (ref 70–99)
Potassium: 3.8 mmol/L (ref 3.5–5.1)
Sodium: 139 mmol/L (ref 135–145)
Total Bilirubin: 0.6 mg/dL (ref 0.3–1.2)
Total Protein: 6.6 g/dL (ref 6.5–8.1)

## 2020-12-09 LAB — GLUCOSE, CAPILLARY
Glucose-Capillary: 123 mg/dL — ABNORMAL HIGH (ref 70–99)
Glucose-Capillary: 129 mg/dL — ABNORMAL HIGH (ref 70–99)

## 2020-12-09 LAB — LIPID PANEL
Cholesterol: 129 mg/dL (ref 0–200)
HDL: 29 mg/dL — ABNORMAL LOW (ref >40)
LDL Cholesterol: 83 mg/dL (ref 0–99)
Total CHOL/HDL Ratio: 4.4 RATIO
Triglycerides: 84 mg/dL (ref <150)
VLDL: 17 mg/dL (ref 0–40)

## 2020-12-09 LAB — TSH: TSH: 0.39 u[IU]/mL (ref 0.350–4.500)

## 2020-12-09 LAB — CK: Total CK: 55 U/L (ref 49–397)

## 2020-12-09 LAB — HIV ANTIBODY (ROUTINE TESTING W REFLEX): HIV Screen 4th Generation wRfx: NONREACTIVE

## 2020-12-09 MED ORDER — ADULT MULTIVITAMIN W/MINERALS CH
1.0000 | ORAL_TABLET | Freq: Every day | ORAL | Status: DC
  Administered 2020-12-10 – 2020-12-15 (×6): 1 via ORAL
  Filled 2020-12-09 (×9): qty 1

## 2020-12-09 MED ORDER — NICOTINE 21 MG/24HR TD PT24
21.0000 mg | MEDICATED_PATCH | Freq: Every day | TRANSDERMAL | Status: DC
  Administered 2020-12-11 – 2020-12-15 (×5): 21 mg via TRANSDERMAL
  Filled 2020-12-09 (×10): qty 1

## 2020-12-09 MED ORDER — TRAZODONE HCL 50 MG PO TABS
50.0000 mg | ORAL_TABLET | Freq: Every day | ORAL | Status: DC
  Administered 2020-12-09 – 2020-12-14 (×6): 50 mg via ORAL
  Filled 2020-12-09: qty 14
  Filled 2020-12-09 (×4): qty 1
  Filled 2020-12-09: qty 14
  Filled 2020-12-09 (×3): qty 1

## 2020-12-09 NOTE — H&P (Signed)
Psychiatric Admission Assessment Adult  Patient Identification: Joshua Thomas MRN:  [id number] Date of Evaluation:  12/09/2020 Chief Complaint:  MDD (major depressive disorder), recurrent severe, without psychosis ([place]) [F33.2] Principal Diagnosis: MDD (major depressive disorder), recurrent severe, without psychosis ([place]) Diagnosis:  Principal Problem:   MDD (major depressive disorder), recurrent severe, without psychosis ([place])   History of Present Illness: Patient is a 45 year old male with extensive history of polysubstance abuse, substance-induced mood disorder, attempted suicide of breath heroin, multiple ED visits for substance abuse, had multiple psychiatric hospitalization presents involuntarily to [facility] from [facility]  CHART REVIEW Patient has had multiple psychiatric hospitalizations as well as rehabilitation.  Patient most recently was hospitalized at behavioral health hospital for similar circumstances.  Patient was discharged on gabapentin, metformin, nicotine, sertraline, and trazodone.  Patient was to be accepted to [facility] and was transported there following discharge.  However, patient appears to have had not followed up with [facility] and continued to use fentanyl following discharge as noted by multiple ED notes.  Pertinent labs obtained prior to hospitalization include: UDS positive for amphetamines and benzodiazepines, CBG 304, WBC 10.7, BUN 30, creatinine 1.41, calcium 8.1, AST 195 (442), ALT [id number] ([id number]), salicylate Q000111Q, acetaminophen level <10, CK 55 (down from 836), negative HIV, negative RPR, lipid panel WNL, TSH 0.39 (normal).  TODAY'S INTERVIEW Patient was seen and staffed with attending Dr. Berdine Addison.  Patient was seen resting in bed.  Patient reports that he is "same old, same old".  Patient reports that he has been using fentanyl regularly since discharge last hospitalization.  Patient reports that he attempted to go to [facility] and then that plan "kind of fell through and  did not happen".  Patient continues to report depression and symptoms of depression including poor sleep, poor concentration, depressed mood, anhedonia, poor appetite, low concentration, hopelessness.  Patient denies present suicidal ideations or homicidal ideations.  Discussed patient's fentanyl use and asked why resumed fentanyl and patient reports that "I am an addict, of course I would start using again".  Patient reports he is also using methamphetamines and benzodiazepines.  Patient reports methamphetamine was last used a week ago and benzodiazepine was used with fentanyl. Patient reports he still is interested in rehabilitation.  Patient discussed with this writer being interested in an inpatient rehabilitation facility.  Discussed that we would attempt to get him one patient reports that he is okay if it is far away as he really needs to "be away from family for a bit".  Patient reports history of bipolar disorder; however, given patient's substance use this is unclear.  Patient reports some symptoms of hypomania; however, it is unclear whether patient has actually ever had a hypomanic/manic episode that has not been precipitated by substances.  Patient denies any psychotic symptoms including delusions, auditory/visual hallucinations, ideas of reference, thought insertion/extraction.  Discussed plan to resume all home medications that patient was previously discharged on and will titrate appropriately based on how he is doing.    Associated Signs/Symptoms: Depression Symptoms:  depressed mood, insomnia, fatigue, feelings of worthlessness/guilt, hopelessness, suicidal thoughts without plan, Duration of Depression Symptoms: Greater than two weeks  (Hypo) Manic Symptoms:   none Anxiety Symptoms:   none Psychotic Symptoms:   none PTSD Symptoms: none Total Time spent with patient: 1 hour  Past Psychiatric Hx: Previous Psych Diagnoses: MDD without psychosis, substance-induced mood  disorder, substance use disorder-severe, opioid abuse Prior inpatient treatment: Yes Current/prior outpatient treatment: Yes Prior rehab hx: Yes Psychotherapy hx: Denies History  of suicide: Multiple heroin overdose as well as accidental overdose History of homicide: Denies Psychiatric medication history: Zoloft, gabapentin, trazodone Psychiatric medication compliance history: Poor Current Psychiatrist: None Current therapist: None  Substance Abuse Hx: Alcohol: None Tobacco: 1 pack/day Illicit drugs Fentanyl Rx drug abuse: Yes Rehab hx: Multiple but patient tends to sabotage own drug rehabilitation  Past Medical History: Medical Diagnoses: Hep C, GERD, hypertension Home Rx: None Prior Hosp: Multiple Prior Surgeries/Trauma: None reported Head trauma, LOC, concussions, seizures:  Allergies: None   Is the patient at risk to self? Yes.    Has the patient been a risk to self in the past 6 months? Yes.    Has the patient been a risk to self within the distant past? Yes.    Is the patient a risk to others? No.  Has the patient been a risk to others in the past 6 months? No.  Has the patient been a risk to others within the distant past? No.    Alcohol Screening:  1. How often do you have a drink containing alcohol?: Monthly or less 2. How many drinks containing alcohol do you have on a typical day when you are drinking?: 1 or 2 3. How often do you have six or more drinks on one occasion?: Never AUDIT-C Score: 1 9. Have you or someone else been injured as a result of your drinking?: No 10. Has a relative or friend or a doctor or another health worker been concerned about your drinking or suggested you cut down?: No Alcohol Use Disorder Identification Test Final Score (AUDIT): 1 Substance Abuse History in the last 12 months:  Yes.   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   COPD (chronic obstructive pulmonary disease) (HCC)    DDD (degenerative disc disease), lumbar    Degenerative disc disease, lumbar    Depression    Discitis    GERD (gastroesophageal reflux disease)    Hepatitis C    Hepatitis C    History of stomach ulcers    Hx of diabetes mellitus    due to infection   Hypertension    Lung nodules    3 on L and 2 on R   PTSD (post-traumatic stress disorder)    Sciatica    Substance abuse (HCC)     Past Surgical History:  Procedure Laterality Date   FRACTURE SURGERY     HARDWARE REMOVAL  01/27/2012   Procedure: HARDWARE REMOVAL;  Surgeon: Kathryne Hitch, MD;  Location: [facility]S;  Service: Orthopedics;  Laterality: Left;   IR LUMBAR DISC ASPIRATION W/IMG GUIDE  06/08/2017   RADIOLOGY WITH ANESTHESIA N/A 06/08/2017   Procedure: DISC ASPIRATION;  Surgeon: Julieanne Cotton, MD;  Location: [facility] OR;  Service: Radiology;  Laterality: N/A;   TIBIA IM NAIL INSERTION  01/27/2012   Procedure: INTRAMEDULLARY (IM) NAIL TIBIAL;  Surgeon: Kathryne Hitch, MD;  Location: [facility];  Service: Orthopedics;  Laterality: Left;  Removal of IM Rod and Screws Left Tibia with Exchange Nail, Allograft Bone Graft Left Tibia   Family History:  Family History  Problem Relation Age of Onset   Diabetes Mother    Alcohol abuse Father    Cirrhosis Father    Aneurysm Father    Family Psychiatric  History: None reported Tobacco Screening: 1 pack/day Social History:  Social History   Substance and Sexual Activity  Alcohol Use Not Currently   Alcohol/week: 1.0 standard drink   Types: 1 Standard drinks or equivalent per week   Comment:  occasionally     Social History   Substance and Sexual Activity  Drug Use Yes   Types: IV, Fentanyl, Benzodiazepines   Comment: uses fentanyl every day    Additional Social History:       Allergies:  No Known Allergies Lab Results:  Results for orders placed or performed during the hospital encounter of 12/08/20 (from the past 48 hour(s))  Glucose, capillary     Status: None   Collection Time: 12/08/20  9:00 PM  Result Value Ref Range   Glucose-Capillary 96 70 - 99 mg/dL    Comment: Glucose reference range applies only to samples taken after fasting for at least 8 hours.  Glucose, capillary     Status: Abnormal   Collection Time: 12/09/20  5:56 AM  Result Value Ref Range   Glucose-Capillary 123 (H) 70 - 99 mg/dL    Comment: Glucose reference range applies only to samples taken after fasting for at least 8 hours.  CBC with Differential/Platelet     Status: Abnormal   Collection Time: 12/09/20  6:26 AM  Result Value Ref Range   WBC 3.7 (L) 4.0 - 10.5 K/uL   RBC 4.24 4.22 - 5.81 MIL/uL   Hemoglobin 13.1 13.0 - 17.0 g/dL   HCT 41.4 39.0 - 52.0 %   MCV 97.6 80.0 - 100.0 fL   MCH 30.9 26.0 - 34.0 pg   MCHC 31.6 30.0 - 36.0 g/dL   RDW 14.4 11.5 - 15.5 %   Platelets 146 (L) 150 - 400 K/uL   nRBC 0.0 0.0 - 0.2 %   Neutrophils Relative % 44 %   Neutro Abs 1.6 (L) 1.7 - 7.7 K/uL   Lymphocytes Relative 45 %   Lymphs Abs 1.7 0.7 - 4.0 K/uL   Monocytes Relative 8 %   Monocytes Absolute 0.3 0.1 - 1.0 K/uL   Eosinophils Relative 3 %   Eosinophils Absolute 0.1 0.0 - 0.5 K/uL   Basophils Relative 0 %   Basophils Absolute 0.0 0.0 - 0.1 K/uL   Immature Granulocytes 0 %   Abs Immature Granulocytes 0.01 0.00 - 0.07 K/uL    Comment: Performed at [facility], [place] [address] [place], [place] [address]  Comprehensive metabolic panel     Status: Abnormal   Collection Time: 12/09/20  6:26 AM  Result Value Ref Range   Sodium 139 135 - 145 mmol/L   Potassium 3.8 3.5 -  5.1 mmol/L   Chloride 109 98 - 111 mmol/L   CO2 24 22 - 32 mmol/L   Glucose, Bld 121 (H) 70 - 99 mg/dL    Comment: Glucose reference range applies only to samples taken after fasting for at least 8 hours.   BUN 14 6 - 20 mg/dL   Creatinine, Ser 0.74 0.61 - 1.24 mg/dL   Calcium 8.4 (L) 8.9 - 10.3 mg/dL   Total Protein 6.6 6.5 - 8.1 g/dL   Albumin 3.4 (L) 3.5 - 5.0 g/dL   AST 116 (H) 15 - 41 U/L   ALT 272 (  H) 0 - 44 U/L   Alkaline Phosphatase 46 38 - 126 U/L   Total Bilirubin 0.6 0.3 - 1.2 mg/dL   GFR, Estimated >60 >60 mL/min    Comment: (NOTE) Calculated using the CKD-EPI Creatinine Equation (2021)    Anion gap 6 5 - 15    Comment: Performed at [facility], [place] [address], [place], [place] [address]  TSH     Status: None   Collection Time: 12/09/20  6:26 AM  Result Value Ref Range   TSH 0.390 0.350 - 4.500 uIU/mL    Comment: Performed by a 3rd Generation assay with a functional sensitivity of <=0.01 uIU/mL. Performed at [facility], [place] [address] [place], [place] [address]   Lipid panel     Status: Abnormal   Collection Time: 12/09/20  6:26 AM  Result Value Ref Range   Cholesterol 129 0 - 200 mg/dL   Triglycerides 84 <150 mg/dL   HDL 29 (L) >40 mg/dL   Total CHOL/HDL Ratio 4.4 RATIO   VLDL 17 0 - 40 mg/dL   LDL Cholesterol 83 0 - 99 mg/dL    Comment:        Total Cholesterol/HDL:CHD Risk Coronary Heart Disease Risk Table                     Men   Women  1/2 Average Risk   3.4   3.3  Average Risk       5.0   4.4  2 X Average Risk   9.6   7.1  3 X Average Risk  23.4   11.0        Use the calculated Patient Ratio above and the CHD Risk Table to determine the patient's CHD Risk.        ATP III CLASSIFICATION (LDL):  <100     mg/dL   Optimal  100-129  mg/dL   Near or Above                    Optimal  130-159  mg/dL   Borderline  160-189  mg/dL   High  >190     mg/dL   Very High Performed at [place] [address], [place], [place] [address]   RPR     Status: None   Collection Time: 12/09/20  6:26 AM  Result Value Ref Range   RPR Ser Ql NON REACTIVE NON REACTIVE    Comment: Performed at [facility], [place] [address], [place], [place] [address]  HIV Antibody (routine testing w rflx)     Status: None   Collection Time: 12/09/20  6:26 AM  Result Value Ref Range   HIV Screen 4th Generation wRfx Non Reactive Non Reactive    Comment: Performed at [facility], [place] [address], [place], [place] [address]  CK     Status: None   Collection Time: 12/09/20  6:26 AM  Result Value Ref Range   Total CK 55 49 - 397 U/L    Comment: Performed at [facility], [place] [address], [place], [place] [address]    Blood Alcohol level:  Lab Results  Component Value Date   [facility] <10 11/22/2020   ETH <10 0000000    Metabolic Disorder Labs:  Lab Results  Component Value Date   HGBA1C 6.9 (H) 10/08/2020   MPG 151 10/08/2020   MPG 148.46 09/26/2020   Lab Results  Component Value Date   PROLACTIN 31.3 (H) 05/23/2015   Lab Results  Component Value Date   CHOL 129 12/09/2020   TRIG 84 12/09/2020   HDL 29 (L) 12/09/2020   CHOLHDL 4.4 12/09/2020   VLDL 17 12/09/2020   LDLCALC 83 12/09/2020   LDLCALC 49 10/08/2020    Current Medications: Current Facility-Administered Medications  Medication Dose Route Frequency Provider Last Rate Last Admin   acetaminophen (TYLENOL) tablet 650 mg  650 mg Oral Q6H PRN Ethelene Hal, NP   650 mg at 12/08/20 2146   alum & mag hydroxide-simeth (MAALOX/MYLANTA) 200-200-20 MG/5ML suspension 30 mL  30 mL Oral Q4H PRN Ethelene Hal, NP       cloNIDine (CATAPRES) tablet 0.1 mg  0.1 mg Oral QID Ethelene Hal, NP   0.1 mg at 12/09/20 1206   Followed by   Derrill Memo ON 12/11/2020] cloNIDine (CATAPRES) tablet 0.1 mg  0.1 mg Oral BH-qamhs Ethelene Hal, NP       Followed by   Derrill Memo ON 12/14/2020] cloNIDine (CATAPRES)  tablet 0.1 mg  0.1 mg Oral QAC breakfast Ethelene Hal, NP       dicyclomine (BENTYL) tablet 20 mg  20 mg Oral Q6H PRN Ethelene Hal, NP   20 mg at 12/08/20 2146   hydrOXYzine (ATARAX/VISTARIL) tablet 25 mg  25 mg Oral Q6H PRN Ethelene Hal, NP   25 mg at 12/08/20 2146   loperamide (IMODIUM) capsule 2-4 mg  2-4 mg Oral PRN Ethelene Hal, NP       magnesium hydroxide (MILK OF MAGNESIA) suspension 30 mL  30 mL Oral Daily PRN Ethelene Hal, NP       methocarbamol (ROBAXIN) tablet 500 mg  500 mg Oral Q8H PRN Ethelene Hal, NP   500 mg at 12/08/20 2146   naproxen (NAPROSYN) tablet 500 mg  500 mg Oral BID PRN Ethelene Hal, NP       Derrill Memo ON 12/10/2020] nicotine (NICODERM CQ - dosed in mg/24 hours) patch 21 mg  21 mg Transdermal Daily France Ravens, MD       ondansetron (ZOFRAN-ODT) disintegrating tablet 4 mg  4 mg Oral Q6H PRN Ethelene Hal, NP       sertraline (ZOLOFT) tablet 50 mg  50 mg Oral Daily Ethelene Hal, NP   50 mg at 12/09/20 0855   traZODone (DESYREL) tablet 50 mg  50 mg Oral QHS PRN Ethelene Hal, NP   50 mg at 12/08/20 2146   traZODone (DESYREL) tablet 50 mg  50 mg Oral Aliene Altes, MD       PTA Medications: Medications Prior to Admission  Medication Sig Dispense Refill Last Dose   gabapentin (NEURONTIN) 300 MG capsule Take 1 capsule (300 mg total) by mouth 3 (three) times daily. 90 capsule 0    ibuprofen (ADVIL) 600 MG tablet Take 1 tablet (600 mg total) by mouth every 6 (six) hours as needed. 30 tablet 0    metFORMIN (GLUCOPHAGE-XR) 500 MG 24 hr tablet Take 1 tablet (500 mg total) by mouth 2 (two) times daily with a meal. (Patient not taking: Reported on 10/08/2020) 60 tablet 0    naloxone (NARCAN) nasal spray 4 mg/0.1 mL Take in case of overdose 1 each 0    nicotine (NICODERM CQ - DOSED IN MG/24 HOURS) 21 mg/24hr patch Place 1 patch (21 mg total) onto the skin daily at 6 (six) AM. (Patient not taking:  Reported  on 10/08/2020) 28 patch 0    QUEtiapine (SEROQUEL) 50 MG tablet Take 50 mg by mouth at bedtime.      sertraline (ZOLOFT) 100 MG tablet Take 100 mg by mouth at bedtime.      sertraline (ZOLOFT) 50 MG tablet Take 1 tablet (50 mg total) by mouth at bedtime. (Patient not taking: Reported on 10/08/2020) 30 tablet 0    traZODone (DESYREL) 50 MG tablet Take 1 tablet (50 mg total) by mouth at bedtime as needed for sleep. (Patient not taking: Reported on 10/08/2020) 30 tablet 0     Musculoskeletal: Strength & Muscle Tone: within normal limits Gait & Station: normal Patient leans: N/A    Psychiatric Specialty Exam:  Presentation  General Appearance: Disheveled   Eye Contact:Minimal   Speech:Slow; Slurred   Speech Volume:Normal   Handedness:Right   Mood and Affect  Mood:Anxious; Depressed; Hopeless; Worthless   Affect:Congruent; Depressed    Thought Process  Thought Processes:Coherent; Linear   Duration of Psychotic Symptoms: No data recorded Past Diagnosis of Schizophrenia or Psychoactive disorder: No  Descriptions of Associations:Intact   Orientation:Full (Time, Place and Person)   Thought Content:Logical   Hallucinations:Hallucinations: None   Ideas of Reference:None   Suicidal Thoughts:Suicidal Thoughts: Yes, Passive SI Passive Intent and/or Plan: Without Intent; With Access to Means; With Plan   Homicidal Thoughts:Homicidal Thoughts: No    Sensorium  Memory:Immediate Fair; Recent Fair; Remote Fair   Judgment:Poor   Insight:Fair    Executive Functions  Concentration:Fair   Attention Span:Fair   [place]   Language:Good    Psychomotor Activity  Psychomotor Activity:Psychomotor Activity: Normal    Assets  Assets:Communication Skills; Desire for Improvement; Social Support; Resilience; Financial Resources/Insurance    Sleep  Sleep:Sleep: Fair     Physical Exam: Physical  Exam Vitals and nursing note reviewed.  Constitutional:      Appearance: Normal appearance. He is normal weight.  HENT:     Head: Normocephalic and atraumatic.  Pulmonary:     Effort: Pulmonary effort is normal.  Neurological:     General: No focal deficit present.     Mental Status: He is oriented to person, place, and time.   Review of Systems  Respiratory:  Negative for shortness of breath.   Cardiovascular:  Negative for chest pain.  Gastrointestinal:  Negative for abdominal pain, constipation, diarrhea, heartburn, nausea and vomiting.  Neurological:  Negative for headaches.  Blood pressure 133/78, pulse (!) 50, temperature 98.1 F (36.7 C), temperature source Oral, resp. rate 16, height 5\' 3"  (1.6 m), weight 69.4 kg, SpO2 100 %. Body mass index is 27.1 kg/m.  Treatment Plan Summary: Daily contact with patient to assess and evaluate symptoms and progress in treatment ASSESSMENT Patient is a 45 year old male with extensive history of polysubstance abuse, substance-induced mood disorder, attempted suicide of breath heroin, multiple ED visits for substance abuse, had multiple psychiatric hospitalization presents involuntarily to [facility] from [facility] ED  PLAN Safety and Monitoring: Involuntary admission to inpatient psychiatric unit for safety, stabilization and treatment Daily contact with patient to assess and evaluate symptoms and progress in treatment Patient's case to be discussed in multi-disciplinary team meeting Observation Level : q15 minute checks Vital signs: q12 hours Precautions: suicide, elopement, and assault   Psychiatric Problems Major depressive disorder, severe, recurrent episode without psychosis Opioid use disorder Substance-induced mood disorder -Resume home med Zoloft 50 mg daily for depressive symptoms -Resume home med trazodone 50 mg nightly and x1 more as  needed -Resume home med gabapentin 300 mg 3 times daily for opioid withdrawal as well as  anxiety -Hydroxyzine as needed for anxiety  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  We will reassess LFT tomorrow  Mild hypocalcemia -Calcium 8.4 -Multivitamin daily  PRNs Tylenol 650 mg for mild pain Maalox/Mylanta 30 mL for indigestion Hydroxyzine 25 mg tid for anxiety Milk of Magnesia 30 mL for constipation Trazodone 50 mg for sleep   4. Discharge Planning: Social work and case management to assist with discharge planning and identification of hospital follow-up needs prior to discharge Estimated LOS: 5-7 days Discharge Concerns: Need to establish a safety plan; Medication compliance and effectiveness Discharge Goals: Return home with outpatient referrals for mental health follow-up including medication management/psychotherapy  Observation Level/Precautions:  15 minute checks  Laboratory:  CBC Chemistry Profile HbAIC  Psychotherapy:    Medications:    Consultations:    Discharge Concerns:    Estimated LOS:  Other:     Physician Treatment Plan for Primary Diagnosis: MDD (major depressive disorder), recurrent severe, without psychosis ([place]) Long Term Goal(s): Improvement in symptoms so as ready for discharge  Short Term Goals: Ability to identify changes in lifestyle to reduce recurrence of condition will improve, Ability to verbalize feelings will improve, Ability to disclose and discuss suicidal ideas, Ability to demonstrate self-control will improve, Ability to identify and develop effective coping behaviors will improve, Ability to maintain clinical measurements within normal limits will improve, Compliance with prescribed  medications will improve, and Ability to identify triggers associated with substance abuse/mental health issues will improve  Physician Treatment Plan for Secondary Diagnosis: Principal Problem:   MDD (major depressive disorder), recurrent severe, without psychosis ([place])   Long Term Goal(s): Improvement in symptoms so as ready for discharge  Short Term Goals: Ability to identify changes in lifestyle to reduce recurrence of condition will improve, Ability to verbalize feelings will improve, Ability to disclose and discuss suicidal ideas, Ability to demonstrate self-control will improve, Ability to identify and develop effective coping behaviors will improve, Ability to maintain clinical measurements within normal limits will improve, Compliance with prescribed medications will improve, and Ability to identify triggers associated with substance abuse/mental health issues will improve  I certify that inpatient services furnished can reasonably be expected to improve the patient's condition.    France Ravens, MD 11/29/20223:51 PM

## 2020-12-09 NOTE — [facility] Suicide Risk Assessment (Signed)
[facility] Admission Suicide Risk Assessment   Nursing information obtained from:  Patient Demographic factors:  Male, Unemployed, Caucasian, Low socioeconomic status Current Mental Status:  NA Loss Factors:  NA Historical Factors:  Impulsivity Risk Reduction Factors:  Religious beliefs about death, Living with another person, especially a relative, Positive social support, Sense of responsibility to family  Total Time spent with patient: 20 minutes Principal Problem: MDD (major depressive disorder), recurrent severe, without psychosis ([place]) Diagnosis:  Principal Problem:   MDD (major depressive disorder), recurrent severe, without psychosis ([place])  Subjective Data: Joshua Thomas is a 45 YO CM with a history of depression and substance abuse, well known to service, who presented with opioid overdose and ?suicide attempt versus accidental overdose.   On interview with patient, he has very limited recall of events prior to admit. He recalls running out of his prescription medications, not sleeping well and generally feeling depressed. He states "I guess I was acting crazy" but does not recall if he tried to commit suicide or not, and is not currently feeling suicidal. Denies homicidal ideation. No current hallucinations. He is feeling opioid withdrawal symptoms, consistent with his previous stays.   Continued Clinical Symptoms:  Alcohol Use Disorder Identification Test Final Score (AUDIT): 1 The "Alcohol Use Disorders Identification Test", Guidelines for Use in Primary Care, Second Edition.  World Pharmacologist [facility]). Score between 0-7:  no or low risk or alcohol related problems. Score between 8-15:  moderate risk of alcohol related problems. Score between 16-19:  high risk of alcohol related problems. Score 20 or above:  warrants further diagnostic evaluation for alcohol dependence and treatment.   CLINICAL FACTORS:   Depression:   Comorbid alcohol  abuse/dependence Insomnia Alcohol/Substance Abuse/Dependencies Previous Psychiatric Diagnoses and Treatments   Musculoskeletal: Strength & Muscle Tone: within normal limits Gait & Station: normal Patient leans: N/A  Psychiatric Specialty Exam:  Presentation  General Appearance: Disheveled  Eye Contact:Minimal  Speech:Slow; Slurred  Speech Volume:Normal  Handedness:Right   Mood and Affect  Mood:Anxious; Depressed; Hopeless; Worthless  Affect:Congruent; Depressed   Thought Process  Thought Processes:Coherent; Linear  Descriptions of Associations:Intact  Orientation:Full (Time, Place and Person)  Thought Content:Logical  History of Schizophrenia/Schizoaffective disorder:No  Duration of Psychotic Symptoms:No data recorded Hallucinations:Hallucinations: None  Ideas of Reference:None  Suicidal Thoughts:Suicidal Thoughts: Yes, Passive SI Passive Intent and/or Plan: Without Intent; With Access to Means; With Plan  Homicidal Thoughts:Homicidal Thoughts: No   Sensorium  Memory:Immediate Fair; Recent Fair; Remote Fair  Judgment:Poor  Insight:Fair   Executive Functions  Concentration:Fair  Attention Span:Fair  [place]  Language:Good   Psychomotor Activity  Psychomotor Activity:Psychomotor Activity: Normal   Assets  Assets:Communication Skills; Desire for Improvement; Social Support; Resilience; Financial Resources/Insurance   Sleep  Sleep:Sleep: Fair    Physical Exam: Physical Exam HENT:     Head: Normocephalic.  Eyes:     Extraocular Movements: Extraocular movements intact.  Pulmonary:     Effort: Pulmonary effort is normal.  Musculoskeletal:        General: Normal range of motion.     Cervical back: Normal range of motion.  Neurological:     General: No focal deficit present.     Mental Status: He is alert and oriented to person, place, and time.  Psychiatric:        Attention and Perception:  Attention normal.        Mood and Affect: Mood is depressed. Affect is blunt.        Speech:  Speech normal.        Behavior: Behavior is cooperative.        Thought Content: Thought content is not paranoid or delusional. Thought content does not include homicidal or suicidal ideation.        Cognition and Memory: Cognition and memory normal.        Judgment: Judgment is impulsive.   Review of Systems  Constitutional:  Positive for chills. Negative for fever.  HENT:  Positive for congestion.   Eyes:  Negative for blurred vision.  Respiratory:  Positive for cough.   Cardiovascular:  Negative for chest pain.  Gastrointestinal:  Positive for abdominal pain, diarrhea, nausea and vomiting.  Genitourinary:  Negative for dysuria.  Musculoskeletal:  Positive for back pain.  Skin:  Negative for rash.  Neurological:  Positive for tremors.  Psychiatric/Behavioral:  Positive for depression and substance abuse. Negative for hallucinations and suicidal ideas. The patient is nervous/anxious and has insomnia.   Blood pressure 124/78, pulse 65, temperature 98.1 F (36.7 C), temperature source Oral, resp. rate 16, height 5\' 3"  (1.6 m), weight 69.4 kg, SpO2 100 %. Body mass index is 27.1 kg/m.   COGNITIVE FEATURES THAT CONTRIBUTE TO RISK:  None    SUICIDE RISK:   Moderate:  Frequent suicidal ideation with limited intensity, and duration, some specificity in terms of plans, no associated intent, good self-control, limited dysphoria/symptomatology, some risk factors present, and identifiable protective factors, including available and accessible social support.  PLAN OF CARE: Safety and Monitoring --  Admission to inpatient psychiatric unit for safety, stabilization and treatment -- Daily contact with patient to assess and evaluate symptoms and progress in treatment -- Patient's case to be discussed in multi-disciplinary team meeting. -- Patient will be encouraged to participate in the therapeutic group  milieu. -- Observation Level : q15 minute checks -- Vital signs:  q12 hours -- Precautions: suicide.  Plan  -Monitor Vitals. -Monitor for thoughts of harm to self or others -Monitor for psychosis, disorganization or changes to cognition -Monitor for withdrawal symptoms. -Monitor for medication side effects.  Labs/Studies: Repeat LFT, UA  Medications: Withdrawal coverage, PRNs for sleep, indigestion, restart home medications, NRT for smoking   I certify that inpatient services furnished can reasonably be expected to improve the patient's condition.   , MD 12/09/2020, 4:57 PM

## 2020-12-09 NOTE — Group Note (Signed)
Recreation Therapy Group Note   Group Topic:Animal Assisted Therapy   Group Date: 12/09/2020 Start Time: 1430 End Time: 1515 Facilitators: Bjorn Loser, NT Location: [address]   AAA/T Program Assumption of Risk Form signed by Patient/ or Parent Legal Guardian Yes  Patient understands his/her participation is voluntary Yes   Affect/Mood: N/A   Participation Level: Did not attend    Clinical Observations/Individualized Feedback:     Plan: Continue to engage patient in RT group sessions 2-3x/week.   Caroll Rancher, Antonietta Jewel 12/09/2020 3:42 PM

## 2020-12-09 NOTE — Progress Notes (Signed)
Psychoeducational Group Note  Date:  12/09/2020 Time:  2055  Group Topic/Focus:  Wrap-Up Group:   The focus of this group is to help patients review their daily goal of treatment and discuss progress on daily workbooks.  Participation Level: Did Not Attend  Participation Quality:  Not Applicable  Affect:  Not Applicable  Cognitive:  Not Applicable  Insight:  Not Applicable  Engagement in Group: Not Applicable  Additional Comments:  The patient did not attend group this evening.   Hazle Coca S 12/09/2020, 8:55 PM

## 2020-12-09 NOTE — [facility] Counselor (Signed)
CSW met with patient to discuss need to call [facility] regarding admission to their program.  At this time patient has not called representative from the program. Patient appeared to be in withdrawal, sweating and shaky.  Patient agreed he would try to call but would be unable to at this time. CSW informed patient that the call may be time sensitive for bed availability.     Taytum Wheller, LCSW, [place] Social Worker  [facility]

## 2020-12-09 NOTE — Plan of Care (Signed)
Nurse discussed anxiety, depression and coping skills with patient.  
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
## 2020-12-09 NOTE — Progress Notes (Signed)
D:  Patient's self inventory sheet, patient sleeps good, no sleep medication.  Fair appetite, low energy level, poor concentration.  Rated depression and anxiety 10, hopeless 7.  Withdrawals, tremors, diarrhea, chilling, agitation, runny nose, irritability.  Denied SI.  Physical pain, worst pain in 24 hours #7, back.  Goal is rest.  Plans to "be chill".  A:  Medications administered per MD orders.  Emotional support and encouragement given patient. R:  Safety maintained with 15 minute checks.  Patient denied SI and HI, contracts for safety.  Denied A/V hallucinations.

## 2020-12-10 ENCOUNTER — Encounter (HOSPITAL_COMMUNITY): Payer: Self-pay

## 2020-12-10 DIAGNOSIS — F332 Major depressive disorder, recurrent severe without psychotic features: Principal | ICD-10-CM

## 2020-12-10 LAB — HEPATIC FUNCTION PANEL
ALT: 230 U/L — ABNORMAL HIGH (ref 0–44)
AST: 83 U/L — ABNORMAL HIGH (ref 15–41)
Albumin: 3.4 g/dL — ABNORMAL LOW (ref 3.5–5.0)
Alkaline Phosphatase: 45 U/L (ref 38–126)
Bilirubin, Direct: 0.1 mg/dL (ref 0.0–0.2)
Indirect Bilirubin: 0.2 mg/dL — ABNORMAL LOW (ref 0.3–0.9)
Total Bilirubin: 0.3 mg/dL (ref 0.3–1.2)
Total Protein: 6.8 g/dL (ref 6.5–8.1)

## 2020-12-10 LAB — CBC
HCT: 41.9 % (ref 39.0–52.0)
Hemoglobin: 13.4 g/dL (ref 13.0–17.0)
MCH: 30.9 pg (ref 26.0–34.0)
MCHC: 32 g/dL (ref 30.0–36.0)
MCV: 96.5 fL (ref 80.0–100.0)
Platelets: 144 10*3/uL — ABNORMAL LOW (ref 150–400)
RBC: 4.34 MIL/uL (ref 4.22–5.81)
RDW: 14 % (ref 11.5–15.5)
WBC: 4.4 10*3/uL (ref 4.0–10.5)
nRBC: 0 % (ref 0.0–0.2)

## 2020-12-10 LAB — HEMOGLOBIN A1C
Hgb A1c MFr Bld: 6.5 % — ABNORMAL HIGH (ref 4.8–5.6)
Mean Plasma Glucose: 140 mg/dL

## 2020-12-10 LAB — GLUCOSE, CAPILLARY: Glucose-Capillary: 172 mg/dL — ABNORMAL HIGH (ref 70–99)

## 2020-12-10 MED ORDER — GABAPENTIN 300 MG PO CAPS
300.0000 mg | ORAL_CAPSULE | Freq: Three times a day (TID) | ORAL | Status: DC
  Administered 2020-12-10 – 2020-12-13 (×10): 300 mg via ORAL
  Filled 2020-12-10 (×15): qty 1

## 2020-12-10 NOTE — Progress Notes (Addendum)
[facility] MD Progress Note  12/10/2020 1:15 PM Joshua Thomas  MRN:  [id number]  Subjective:  Patient is a 45 year old male with extensive history of polysubstance abuse, substance-induced mood disorder, attempted suicide of breath heroin, multiple ED visits for substance abuse, had multiple psychiatric hospitalization presents involuntarily to [facility] from [facility] ED.  Chart Review, 24 hr Events: The patient's chart was reviewed and nursing notes were reviewed. The patient's case was discussed in multidisciplinary team meeting.  Per MAR: - Patient is compliant with scheduled meds. - Agitation PRNs: none Per RN notes, no documented behavioral issues and is not attending group. Patient slept, 6.5 hours  TODAY'S INTERVIEW Patient seen and assessed with attending Dr. Caswell Corwin in patient's room.  Patient reports he is doing better than yesterday. Patient still reports significant depression and hopelessness but became more optimistic after hearing that he may have an inpatient rehabilitation facility that he may go to. Patient denies suicidal ideation and homicidal ideation. Patient does endorse still experiencing significant withdrawal symptoms including sweating, shaking, and fatigue. Patient reports he usually will feel much better a few days after last use of fentanyl.  Discussed plan to continue symptomatic management of patient's withdrawal with clonidine taper. In addition, encouraged patient to ensure he makes it to the drug rehabilitation center that [place] and patient are working towards patient getting accepted into. Patient verbalized understanding and had no other questions at this time.  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   COPD (chronic obstructive pulmonary disease) (HCC)    DDD (degenerative disc disease), lumbar    Degenerative disc disease, lumbar    Depression    Discitis    GERD (gastroesophageal reflux disease)    Hepatitis C    Hepatitis C    History of stomach ulcers    Hx of diabetes mellitus    due to infection   Hypertension    Lung nodules    3 on L and 2 on R   PTSD (post-traumatic stress disorder)    Sciatica    Substance abuse ([place])     Past Surgical History:  Procedure Laterality Date   FRACTURE SURGERY     HARDWARE REMOVAL  01/27/2012   Procedure: HARDWARE REMOVAL;  Surgeon: Mcarthur Rossetti, MD;  Location: [facility];  Service: Orthopedics;  Laterality: Left;   IR LUMBAR [place] W/IMG GUIDE  06/08/2017   RADIOLOGY WITH ANESTHESIA N/A 06/08/2017   Procedure: DISC ASPIRATION;  Surgeon: Luanne Bras, MD;  Location: [place];  Service: Radiology;  Laterality: N/A;   TIBIA IM NAIL INSERTION  01/27/2012   Procedure: INTRAMEDULLARY (IM) NAIL TIBIAL;  Surgeon: Mcarthur Rossetti, MD;  Location: [facility];  Service: Orthopedics;  Laterality: Left;  Removal of IM Rod and Screws Left Tibia with Exchange Nail, Allograft Bone Graft Left Tibia   Family History:  Family History  Problem Relation Age of Onset  Diabetes Mother    Alcohol abuse Father    Cirrhosis Father    Aneurysm Father    Family Psychiatric  History: see H&P Social History:  Social History   Substance and Sexual Activity  Alcohol Use Not Currently   Alcohol/week: 1.0 standard drink   Types: 1 Standard drinks or equivalent per week   Comment: occasionally     Social History    Substance and Sexual Activity  Drug Use Yes   Types: IV, Fentanyl, Benzodiazepines   Comment: uses fentanyl every day    Social History   Socioeconomic History   Marital status: Single    Spouse name: Not on file   Number of children: Not on file   Years of education: 12   Highest education level: High school graduate  Occupational History   Not on file  Tobacco Use   Smoking status: Every Day    Packs/day: 1.00    Years: 33.00    Pack years: 33.00    Types: Cigarettes   Smokeless tobacco: Former    Quit date: 08/08/2006   Tobacco comments:    1 PPD  Substance and Sexual Activity   Alcohol use: Not Currently    Alcohol/week: 1.0 standard drink    Types: 1 Standard drinks or equivalent per week    Comment: occasionally   Drug use: Yes    Types: IV, Fentanyl, Benzodiazepines    Comment: uses fentanyl every day   Sexual activity: Not Currently  Other Topics Concern   Not on file  Social History Narrative   Not on file   Social Determinants of Health   Financial Resource Strain: Not on file  Food Insecurity: Not on file  Transportation Needs: Not on file  Physical Activity: Not on file  Stress: Not on file  Social Connections: Not on file   Additional Social History:     Sleep: Fair  Appetite:  Fair  Current Medications: Current Facility-Administered Medications  Medication Dose Route Frequency Provider Last Rate Last Admin   acetaminophen (TYLENOL) tablet 650 mg  650 mg Oral Q6H PRN Laveda Abbe, NP   650 mg at 12/08/20 2146   alum & mag hydroxide-simeth (MAALOX/MYLANTA) 200-200-20 MG/5ML suspension 30 mL  30 mL Oral Q4H PRN Laveda Abbe, NP       cloNIDine (CATAPRES) tablet 0.1 mg  0.1 mg Oral QID Laveda Abbe, NP   0.1 mg at 12/10/20 1152   Followed by   Melene Muller ON 12/11/2020] cloNIDine (CATAPRES) tablet 0.1 mg  0.1 mg Oral BH-qamhs Laveda Abbe, NP       Followed by   Melene Muller ON 12/14/2020] cloNIDine (CATAPRES) tablet  0.1 mg  0.1 mg Oral QAC breakfast Laveda Abbe, NP       dicyclomine (BENTYL) tablet 20 mg  20 mg Oral Q6H PRN Laveda Abbe, NP   20 mg at 12/08/20 2146   hydrOXYzine (ATARAX/VISTARIL) tablet 25 mg  25 mg Oral Q6H PRN Laveda Abbe, NP   25 mg at 12/09/20 2210   loperamide (IMODIUM) capsule 2-4 mg  2-4 mg Oral PRN Laveda Abbe, NP       magnesium hydroxide (MILK OF MAGNESIA) suspension 30 mL  30 mL Oral Daily PRN Laveda Abbe, NP       methocarbamol (ROBAXIN) tablet 500 mg  500 mg Oral Q8H PRN Laveda Abbe, NP   500 mg at 12/10/20 1003   multivitamin with minerals tablet 1 tablet  1 tablet Oral Daily Park Pope, MD   1 tablet at 12/10/20 4076   naproxen (NAPROSYN) tablet 500 mg  500 mg Oral BID PRN Laveda Abbe, NP   500 mg at 12/10/20 8088   nicotine (NICODERM CQ - dosed in mg/24 hours) patch 21 mg  21 mg Transdermal Daily Park Pope, MD       ondansetron (ZOFRAN-ODT) disintegrating tablet 4 mg  4 mg Oral Q6H PRN Laveda Abbe, NP       sertraline (ZOLOFT) tablet 50 mg  50 mg Oral Daily Laveda Abbe, NP   50 mg at 12/10/20 1103   traZODone (DESYREL) tablet 50 mg  50 mg Oral QHS PRN Laveda Abbe, NP   50 mg at 12/08/20 2146   traZODone (DESYREL) tablet 50 mg  50 mg Oral Seabron Spates, MD   50 mg at 12/09/20 2209    Lab Results:  Results for orders placed or performed during the hospital encounter of 12/08/20 (from the past 48 hour(s))  Glucose, capillary     Status: None   Collection Time: 12/08/20  9:00 PM  Result Value Ref Range   Glucose-Capillary 96 70 - 99 mg/dL    Comment: Glucose reference range applies only to samples taken after fasting for at least 8 hours.  Glucose, capillary     Status: Abnormal   Collection Time: 12/09/20  5:56 AM  Result Value Ref Range   Glucose-Capillary 123 (H) 70 - 99 mg/dL    Comment: Glucose reference range applies only to samples taken after fasting for at least 8  hours.  CBC with Differential/Platelet     Status: Abnormal   Collection Time: 12/09/20  6:26 AM  Result Value Ref Range   WBC 3.7 (L) 4.0 - 10.5 K/uL   RBC 4.24 4.22 - 5.81 MIL/uL   Hemoglobin 13.1 13.0 - 17.0 g/dL   HCT 15.9 45.8 - 59.2 %   MCV 97.6 80.0 - 100.0 fL   MCH 30.9 26.0 - 34.0 pg   MCHC 31.6 30.0 - 36.0 g/dL   RDW 92.4 46.2 - 86.3 %   Platelets 146 (L) 150 - 400 K/uL   nRBC 0.0 0.0 - 0.2 %   Neutrophils Relative % 44 %   Neutro Abs 1.6 (L) 1.7 - 7.7 K/uL   Lymphocytes Relative 45 %   Lymphs Abs 1.7 0.7 - 4.0 K/uL   Monocytes Relative 8 %   Monocytes Absolute 0.3 0.1 - 1.0 K/uL   Eosinophils Relative 3 %   Eosinophils Absolute 0.1 0.0 - 0.5 K/uL   Basophils Relative 0 %   Basophils Absolute 0.0 0.0 - 0.1 K/uL   Immature Granulocytes 0 %   Abs Immature Granulocytes 0.01 0.00 - 0.07 K/uL    Comment: Performed at [facility], [address], [place], [place] [address]  Comprehensive metabolic panel     Status: Abnormal   Collection Time: 12/09/20  6:26 AM  Result Value Ref Range   Sodium 139 135 - 145 mmol/L   Potassium 3.8 3.5 - 5.1 mmol/L   Chloride 109 98 - 111 mmol/L   CO2 24 22 - 32 mmol/L   Glucose, Bld 121 (H) 70 - 99 mg/dL    Comment: Glucose reference range applies only to samples taken after fasting for at least 8 hours.   BUN 14 6 - 20 mg/dL   Creatinine, Ser 1.65 0.61 - 1.24 mg/dL   Calcium 8.4 (L) 8.9 -  10.3 mg/dL   Total Protein 6.6 6.5 - 8.1 g/dL   Albumin 3.4 (L) 3.5 - 5.0 g/dL   AST 116 (H) 15 - 41 U/L   ALT 272 (H) 0 - 44 U/L   Alkaline Phosphatase 46 38 - 126 U/L   Total Bilirubin 0.6 0.3 - 1.2 mg/dL   GFR, Estimated >60 >60 mL/min    Comment: (NOTE) Calculated using the CKD-EPI Creatinine Equation (2021)    Anion gap 6 5 - 15    Comment: Performed at [facility], [place] [address], [place], [place] [address]  TSH     Status: None   Collection Time: 12/09/20  6:26 AM  Result Value Ref Range   TSH  0.390 0.350 - 4.500 uIU/mL    Comment: Performed by a 3rd Generation assay with a functional sensitivity of <=0.01 uIU/mL. Performed at [facility], [place] [address] [place], [place] [address]   Lipid panel     Status: Abnormal   Collection Time: 12/09/20  6:26 AM  Result Value Ref Range   Cholesterol 129 0 - 200 mg/dL   Triglycerides 84 <150 mg/dL   HDL 29 (L) >40 mg/dL   Total CHOL/HDL Ratio 4.4 RATIO   VLDL 17 0 - 40 mg/dL   LDL Cholesterol 83 0 - 99 mg/dL    Comment:        Total Cholesterol/HDL:CHD Risk Coronary Heart Disease Risk Table                     Men   Women  1/2 Average Risk   3.4   3.3  Average Risk       5.0   4.4  2 X Average Risk   9.6   7.1  3 X Average Risk  23.4   11.0        Use the calculated Patient Ratio above and the CHD Risk Table to determine the patient's CHD Risk.        ATP III CLASSIFICATION (LDL):  <100     mg/dL   Optimal  100-129  mg/dL   Near or Above                    Optimal  130-159  mg/dL   Borderline  160-189  mg/dL   High  >190     mg/dL   Very High Performed at [place] [address], [place], [place] [address]   RPR     Status: None   Collection Time: 12/09/20  6:26 AM  Result Value Ref Range   RPR Ser Ql NON REACTIVE NON REACTIVE    Comment: Performed at [facility], [place] [address] [place], [place] [address]  HIV Antibody (routine testing w rflx)     Status: None   Collection Time: 12/09/20  6:26 AM  Result Value Ref Range   HIV Screen 4th Generation wRfx Non Reactive Non Reactive    Comment: Performed at [facility], [place] [address] [place], [place] [address]  CK     Status: None   Collection Time: 12/09/20  6:26 AM  Result Value Ref Range   Total CK 55 49 - 397 U/L    Comment: Performed at [facility], [place] [address] [place], [place] [address]  Hemoglobin A1c     Status: Abnormal   Collection Time: 12/09/20  6:26 AM  Result Value Ref  Range  Hgb A1c MFr Bld 6.5 (H) 4.8 - 5.6 %    Comment: (NOTE)         Prediabetes: 5.7 - 6.4         Diabetes: >6.4         Glycemic control for adults with diabetes: <7.0    Mean Plasma Glucose 140 mg/dL    Comment: (NOTE) Performed At: [facility] [place], [place] [id number] Rush Farmer MD [id number]   Glucose, capillary     Status: Abnormal   Collection Time: 12/09/20  8:28 PM  Result Value Ref Range   Glucose-Capillary 129 (H) 70 - 99 mg/dL    Comment: Glucose reference range applies only to samples taken after fasting for at least 8 hours.  Glucose, capillary     Status: Abnormal   Collection Time: 12/10/20  5:49 AM  Result Value Ref Range   Glucose-Capillary 172 (H) 70 - 99 mg/dL    Comment: Glucose reference range applies only to samples taken after fasting for at least 8 hours.   Comment 1 Notify RN   Hepatic function panel     Status: Abnormal   Collection Time: 12/10/20  6:36 AM  Result Value Ref Range   Total Protein 6.8 6.5 - 8.1 g/dL   Albumin 3.4 (L) 3.5 - 5.0 g/dL   AST 83 (H) 15 - 41 U/L   ALT 230 (H) 0 - 44 U/L   Alkaline Phosphatase 45 38 - 126 U/L   Total Bilirubin 0.3 0.3 - 1.2 mg/dL   Bilirubin, Direct 0.1 0.0 - 0.2 mg/dL   Indirect Bilirubin 0.2 (L) 0.3 - 0.9 mg/dL    Comment: Performed at [facility], [place] [address] [place], [place] [address]  CBC     Status: Abnormal   Collection Time: 12/10/20  6:36 AM  Result Value Ref Range   WBC 4.4 4.0 - 10.5 K/uL   RBC 4.34 4.22 - 5.81 MIL/uL   Hemoglobin 13.4 13.0 - 17.0 g/dL   HCT 41.9 39.0 - 52.0 %   MCV 96.5 80.0 - 100.0 fL   MCH 30.9 26.0 - 34.0 pg   MCHC 32.0 30.0 - 36.0 g/dL   RDW 14.0 11.5 - 15.5 %   Platelets 144 (L) 150 - 400 K/uL   nRBC 0.0 0.0 - 0.2 %    Comment: Performed at [facility], [place] [address], [place], [place] [address]    Blood Alcohol level:  Lab Results  Component Value Date   [facility] <10 11/22/2020   ETH  <10 0000000    Metabolic Disorder Labs: Lab Results  Component Value Date   HGBA1C 6.5 (H) 12/09/2020   MPG 140 12/09/2020   MPG 151 10/08/2020   Lab Results  Component Value Date   PROLACTIN 31.3 (H) 05/23/2015   Lab Results  Component Value Date   CHOL 129 12/09/2020   TRIG 84 12/09/2020   HDL 29 (L) 12/09/2020   CHOLHDL 4.4 12/09/2020   VLDL 17 12/09/2020   LDLCALC 83 12/09/2020   LDLCALC 49 10/08/2020    Physical Findings: COWS:  COWS Total Score: 7  Musculoskeletal: Strength & Muscle Tone: within normal limits Gait & Station: normal Patient leans: N/A  Psychiatric Specialty Exam:  Presentation  General Appearance: disheveled Eye Contact:Minimal  Speech:Slow; Slurred  Speech Volume:Normal  Handedness:Right   Mood and Affect  Mood:Depressed; Worthless; Hopeless  Affect: flat  Thought Process  Thought Processes:Coherent; Linear  Descriptions of Associations:Intact  Orientation:Full (Time, Place and Person)  Thought Content:Logical  History of Schizophrenia/Schizoaffective disorder:No  Duration of Psychotic Symptoms: >6 months Hallucinations:Hallucinations: None  Ideas of Reference:None  Suicidal Thoughts: denies  Homicidal Thoughts:Homicidal Thoughts: No   Sensorium  Memory:Immediate Fair; Recent Fair; Remote Fair  Judgment:Poor  Insight:Fair   Executive Functions  Concentration:Fair  Attention Span:Fair  [place]   Psychomotor Activity  Psychomotor Activity:Psychomotor Activity: Normal   Assets  Assets:Communication Skills; Desire for Improvement; Social Support; Resilience   Sleep  Sleep:Sleep: Fair    Physical Exam: Physical Exam Vitals and nursing note reviewed.  Constitutional:      Appearance: He is normal weight. He is ill-appearing and diaphoretic.  HENT:     Head: Normocephalic and atraumatic.  Cardiovascular:     Rate and Rhythm: Bradycardia present.   Pulmonary:     Effort: Pulmonary effort is normal.  Neurological:     General: No focal deficit present.     Mental Status: He is oriented to person, place, and time.   Review of Systems  Respiratory:  Negative for shortness of breath.   Cardiovascular:  Negative for chest pain.  Gastrointestinal:  Negative for abdominal pain, constipation, diarrhea, heartburn, nausea and vomiting.  Neurological:  Negative for headaches.   Blood pressure 139/86, pulse (!) 57, temperature 97.7 F (36.5 C), temperature source Oral, resp. rate 16, height 5\' 3"  (1.6 m), weight 69.4 kg, SpO2 100 %. Body mass index is 27.1 kg/m.   Treatment Plan Summary: Daily contact with patient to assess and evaluate symptoms and progress in treatment and Medication management PLAN Safety and Monitoring: Involuntary admission to inpatient psychiatric unit for safety, stabilization and treatment Daily contact with patient to assess and evaluate symptoms and progress in treatment Patient's case to be discussed in multi-disciplinary team meeting Observation Level : q15 minute checks Vital signs: q12 hours Precautions: suicide, elopement, and assault     Psychiatric Problems Major depressive disorder, severe, recurrent episode without psychosis Opioid use disorder Substance-induced mood disorder -Resume home med Zoloft 50 mg daily for depressive symptoms -Resume home med trazodone 50 mg nightly and x1 more as needed -Resume home med gabapentin 300 mg 3 times daily for opioid withdrawal as well as anxiety -Hydroxyzine as needed for anxiety   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  We will continue to monitor.    Mild hypocalcemia -Calcium 8.4 -Multivitamin daily   PRNs Tylenol 650 mg for mild pain Maalox/Mylanta 30 mL for indigestion Hydroxyzine 25 mg tid for anxiety Milk of Magnesia 30 mL for constipation Trazodone 50 mg for sleep   4. Discharge Planning: Social work and case management to assist with discharge planning and identification of hospital follow-up needs prior to discharge Estimated LOS: 5-7 days Discharge Concerns: Need to establish a safety plan; Medication compliance and effectiveness Discharge Goals: Return home with outpatient referrals for mental health follow-up including medication management/psychotherapy   France Ravens, MD 12/10/2020, 1:15 PM  Total Time Spent in Direct Patient Care:  I personally spent 20 minutes on the unit  in direct patient care. The direct patient care time included face-to-face time with the patient, reviewing the patient's chart, communicating with other professionals, and coordinating care. Greater than 50% of this time was spent in counseling or coordinating care with the patient regarding goals of hospitalization, psycho-education, and discharge planning needs.  I have independently evaluated the patient during a face-to-face assessment on 12/10/20. I reviewed the patient's chart, and I participated in key portions of the service. I discussed the case with the [employer], and I agree with the assessment and plan of care as documented in the [employer] note, as addended by me or notated below:  I agree with the note and plan. Depression continues to be very significant. Withdrawal symptoms are being treated symptomatically.   Janine Limbo, MD Psychiatrist

## 2020-12-10 NOTE — Progress Notes (Signed)
   12/09/20 2256  Psych Admission Type (Psych Patients Only)  Admission Status Involuntary  Psychosocial Assessment  Patient Complaints Anxiety;Depression  Eye Contact Brief  Facial Expression Anxious  Affect Anxious  Speech Logical/coherent  Interaction Assertive  Motor Activity Slow  Appearance/Hygiene In scrubs  Behavior Characteristics Appropriate to situation  Mood Anxious  Thought Process  Coherency WDL  Content WDL  Delusions None reported or observed  Perception WDL  Hallucination None reported or observed  Judgment Poor  Confusion None  Danger to Self  Current suicidal ideation? Denies  Danger to Others  Danger to Others None reported or observed

## 2020-12-10 NOTE — Group Note (Signed)
Date:  12/10/2020 Time:  10:02 AM  Group Topic/Focus:  Goals Group:   The focus of this group is to help patients establish daily goals to achieve during treatment and discuss how the patient can incorporate goal setting into their daily lives to aide in recovery.    Participation Level:  Did Not Attend  Participation Quality:   Patient did not attend  Affect:   P atient did not attend group.  Cognitive:   Patient  did not attend.  Insight: None  Engagement in Group:   Patient did not attend group.  Modes of Intervention:  Discussion  Additional Comments:  Patient did not attend.  Reymundo Poll 12/10/2020, 10:02 AM

## 2020-12-10 NOTE — Progress Notes (Signed)
Psychoeducational Group Note  Date:  12/10/2020 Time:  2135  Group Topic/Focus:  Relapse Prevention Planning:   The focus of this group is to define relapse and discuss the need for planning to combat relapse.  Participation Level: Did Not Attend  Participation Quality:  Not Applicable  Affect:  Not Applicable  Cognitive:  Not Applicable  Insight:  Not Applicable  Engagement in Group: Not Applicable  Additional Comments:  The patient did not attend the evening N.A. meeting.   Hazle Coca S 12/10/2020, 9:35 PM

## 2020-12-10 NOTE — BH IP Treatment Plan (Signed)
Interdisciplinary Treatment and Diagnostic Plan Update  12/10/2020 Time of Session: 9:25am ` Joshua Thomas MRN: [id number]  Principal Diagnosis: MDD (major depressive disorder), recurrent severe, without psychosis ([place])  Secondary Diagnoses: Principal Problem:   MDD (major depressive disorder), recurrent severe, without psychosis ([place]) Active Problems:   Substance induced mood disorder ([place])   Opioid abuse ([place])   Current Medications:  Current Facility-Administered Medications  Medication Dose Route Frequency Provider Last Rate Last Admin   acetaminophen (TYLENOL) tablet 650 mg  650 mg Oral Q6H PRN Ethelene Hal, NP   650 mg at 12/08/20 2146   alum & mag hydroxide-simeth (MAALOX/MYLANTA) 200-200-20 MG/5ML suspension 30 mL  30 mL Oral Q4H PRN Ethelene Hal, NP       cloNIDine (CATAPRES) tablet 0.1 mg  0.1 mg Oral QID Ethelene Hal, NP   0.1 mg at 12/10/20 1152   Followed by   Derrill Memo ON 12/11/2020] cloNIDine (CATAPRES) tablet 0.1 mg  0.1 mg Oral BH-qamhs Ethelene Hal, NP       Followed by   Derrill Memo ON 12/14/2020] cloNIDine (CATAPRES) tablet 0.1 mg  0.1 mg Oral QAC breakfast Ethelene Hal, NP       dicyclomine (BENTYL) tablet 20 mg  20 mg Oral Q6H PRN Ethelene Hal, NP   20 mg at 12/08/20 2146   gabapentin (NEURONTIN) capsule 300 mg  300 mg Oral TID France Ravens, MD       hydrOXYzine (ATARAX/VISTARIL) tablet 25 mg  25 mg Oral Q6H PRN Ethelene Hal, NP   25 mg at 12/09/20 2210   loperamide (IMODIUM) capsule 2-4 mg  2-4 mg Oral PRN Ethelene Hal, NP       magnesium hydroxide (MILK OF MAGNESIA) suspension 30 mL  30 mL Oral Daily PRN Ethelene Hal, NP       methocarbamol (ROBAXIN) tablet 500 mg  500 mg Oral Q8H PRN Ethelene Hal, NP   500 mg at 12/10/20 1003   multivitamin with minerals tablet 1 tablet  1 tablet Oral Daily France Ravens, MD   1 tablet at 12/10/20 4650   naproxen (NAPROSYN) tablet 500 mg  500 mg Oral  BID PRN Ethelene Hal, NP   500 mg at 12/10/20 3546   nicotine (NICODERM CQ - dosed in mg/24 hours) patch 21 mg  21 mg Transdermal Daily France Ravens, MD       ondansetron (ZOFRAN-ODT) disintegrating tablet 4 mg  4 mg Oral Q6H PRN Ethelene Hal, NP       sertraline (ZOLOFT) tablet 50 mg  50 mg Oral Daily Ethelene Hal, NP   50 mg at 12/10/20 5681   traZODone (DESYREL) tablet 50 mg  50 mg Oral QHS PRN Ethelene Hal, NP   50 mg at 12/08/20 2146   traZODone (DESYREL) tablet 50 mg  50 mg Oral Aliene Altes, MD   50 mg at 12/09/20 2209   PTA Medications: Medications Prior to Admission  Medication Sig Dispense Refill Last Dose   gabapentin (NEURONTIN) 300 MG capsule Take 1 capsule (300 mg total) by mouth 3 (three) times daily. 90 capsule 0    ibuprofen (ADVIL) 600 MG tablet Take 1 tablet (600 mg total) by mouth every 6 (six) hours as needed. 30 tablet 0    metFORMIN (GLUCOPHAGE-XR) 500 MG 24 hr tablet Take 1 tablet (500 mg total) by mouth 2 (two) times daily with a meal. (Patient not taking: Reported on 10/08/2020) 60 tablet  0    naloxone (NARCAN) nasal spray 4 mg/0.1 mL Take in case of overdose 1 each 0    nicotine (NICODERM CQ - DOSED IN MG/24 HOURS) 21 mg/24hr patch Place 1 patch (21 mg total) onto the skin daily at 6 (six) AM. (Patient not taking: Reported on 10/08/2020) 28 patch 0    QUEtiapine (SEROQUEL) 50 MG tablet Take 50 mg by mouth at bedtime.      sertraline (ZOLOFT) 100 MG tablet Take 100 mg by mouth at bedtime.      sertraline (ZOLOFT) 50 MG tablet Take 1 tablet (50 mg total) by mouth at bedtime. (Patient not taking: Reported on 10/08/2020) 30 tablet 0    traZODone (DESYREL) 50 MG tablet Take 1 tablet (50 mg total) by mouth at bedtime as needed for sleep. (Patient not taking: Reported on 10/08/2020) 30 tablet 0     Patient Stressors: Substance abuse    Patient Strengths: Active sense of humor  Average or above average intelligence  Supportive family/friends    Treatment Modalities: Medication Management, Group therapy, Case management,  1 to 1 session with clinician, Psychoeducation, Recreational therapy.   Physician Treatment Plan for Primary Diagnosis: MDD (major depressive disorder), recurrent severe, without psychosis ([place]) Long Term Goal(s): Improvement in symptoms so as ready for discharge   Short Term Goals: Ability to identify changes in lifestyle to reduce recurrence of condition will improve Ability to verbalize feelings will improve Ability to disclose and discuss suicidal ideas Ability to demonstrate self-control will improve Ability to identify and develop effective coping behaviors will improve Ability to maintain clinical measurements within normal limits will improve Compliance with prescribed medications will improve Ability to identify triggers associated with substance abuse/mental health issues will improve  Medication Management: Evaluate patient's response, side effects, and tolerance of medication regimen.  Therapeutic Interventions: 1 to 1 sessions, Unit Group sessions and Medication administration.  Evaluation of Outcomes: Not Met  Physician Treatment Plan for Secondary Diagnosis: Principal Problem:   MDD (major depressive disorder), recurrent severe, without psychosis ([place]) Active Problems:   Substance induced mood disorder ([place])   Opioid abuse ([place])  Long Term Goal(s): Improvement in symptoms so as ready for discharge   Short Term Goals: Ability to identify changes in lifestyle to reduce recurrence of condition will improve Ability to verbalize feelings will improve Ability to disclose and discuss suicidal ideas Ability to demonstrate self-control will improve Ability to identify and develop effective coping behaviors will improve Ability to maintain clinical measurements within normal limits will improve Compliance with prescribed medications will improve Ability to identify triggers associated with  substance abuse/mental health issues will improve     Medication Management: Evaluate patient's response, side effects, and tolerance of medication regimen.  Therapeutic Interventions: 1 to 1 sessions, Unit Group sessions and Medication administration.  Evaluation of Outcomes: Not Met   RN Treatment Plan for Primary Diagnosis: MDD (major depressive disorder), recurrent severe, without psychosis ([place]) Long Term Goal(s): Knowledge of disease and therapeutic regimen to maintain health will improve  Short Term Goals: Ability to remain free from injury will improve, Ability to participate in decision making will improve, Ability to verbalize feelings will improve, Ability to disclose and discuss suicidal ideas, and Ability to identify and develop effective coping behaviors will improve  Medication Management: RN will administer medications as ordered by provider, will assess and evaluate patient's response and provide education to patient for prescribed medication. RN will report any adverse and/or side effects to prescribing provider.  Therapeutic  Interventions: 1 on 1 counseling sessions, Psychoeducation, Medication administration, Evaluate responses to treatment, Monitor vital signs and CBGs as ordered, Perform/monitor CIWA, COWS, AIMS and Fall Risk screenings as ordered, Perform wound care treatments as ordered.  Evaluation of Outcomes: Not Met   LCSW Treatment Plan for Primary Diagnosis: MDD (major depressive disorder), recurrent severe, without psychosis ([place]) Long Term Goal(s): Safe transition to appropriate next level of care at discharge, Engage patient in therapeutic group addressing interpersonal concerns.  Short Term Goals: Engage patient in aftercare planning with referrals and resources, Increase social support, Increase emotional regulation, Facilitate acceptance of mental health diagnosis and concerns, Identify triggers associated with mental health/substance abuse issues, and  Increase skills for wellness and recovery  Therapeutic Interventions: Assess for all discharge needs, 1 to 1 time with Social worker, Explore available resources and support systems, Assess for adequacy in community support network, Educate family and significant other(s) on suicide prevention, Complete Psychosocial Assessment, Interpersonal group therapy.  Evaluation of Outcomes: Not Met   Progress in Treatment: Attending groups: No. Participating in groups: No. Taking medication as prescribed: Yes. Toleration medication: Yes. Family/Significant other contact made: Yes, individual(s) contacted:  Mother  Patient understands diagnosis: Yes. Discussing patient identified problems/goals with staff: Yes. Medical problems stabilized or resolved: Yes. Denies suicidal/homicidal ideation: Yes. Issues/concerns per patient self-inventory: No.   New problem(s) identified: No, Describe:  None   New Short Term/Long Term Goal(s): medication stabilization, elimination of SI thoughts, development of comprehensive mental wellness plan.   Patient Goals: "To go to residential treatment"   Discharge Plan or Barriers: Patient recently admitted. CSW will continue to follow and assess for appropriate referrals and possible discharge planning.   Reason for Continuation of Hospitalization: Depression Medication stabilization Suicidal ideation Withdrawal symptoms  Estimated Length of Stay: 3 to 5 days    Scribe for Treatment Team: Darleen Crocker, Latanya Presser 12/10/2020 2:40 PM

## 2020-12-10 NOTE — Group Note (Signed)
LCSW Group Therapy Note   Group Date: 12/10/2020 Start Time: 1300 End Time: 1400  Type of Therapy and Topic:  Group Therapy:  Self-Esteem   Participation Level:  Did Not Attend  Description of Group: This group addressed positive self-esteem. Patients were given a worksheet with a blank shield. Patients were asked what a shield is and when it is used. Patients were asked to list, draw, or write protective factors in the their lives on their shields. Patients discussed the words, ideas and drawings that they put on their shield. Patients were encouraged to have a daily reflection of positive characteristics/ protective factors.  Therapeutic Goals Patient will verbalize two of their positive qualities Patient will demonstrate insight but naming social supports in their lives Patient will verbalize their feelings when voicing positive self affirmations and when voicing positive affirmations of others Patients will discuss the potential positive impact on their wellness/recovery of focusing on positive traits of self and others.  Summary of Patient Progress:   Joshua Thomas 12/10/2020  2:02 PM

## 2020-12-10 NOTE — [facility] Group Notes (Signed)
Patient did not attend group.

## 2020-12-10 NOTE — Progress Notes (Addendum)
Marcelino Duster, Hotel manager, from [employer], requested progress notes for patient.  Marcelino Duster also requested for patient to call so that he can participate in pre screener for authorization for his insurance.  CSW notified patient to call and faxed over appropriate documentation. \  CSW checked in with patient at 1:00pm to see if patient had called for the intake prescreener.  Patient was lying in bed and said that he had not yet completed it.  Pt agreed that he would give them a call. CSW will check in at the end of the day.    Mishael Haran, LCSW, LCAS Clincal Social Worker  [facility]

## 2020-12-10 NOTE — Progress Notes (Signed)
Pt denies SI/HI/AVH and verbally agrees to approach staff if these become apparent or before harming themselves/others. Rates depression 6/10. Rates anxiety 6/10. Rates pain 7/10, generalized body. Pt has been in room for the majority of the day but did go to dinner. Scheduled medications administered to Pt, per MD orders. RN provided support and encouragement to Pt. Q15 min safety checks implemented and continued. Pt safe on the unit. RN will continue to monitor and intervene as needed.   12/10/20 0953  Psychosocial Assessment  Patient Complaints Anxiety;Depression;Shakiness;Substance abuse  Eye Contact Brief  Facial Expression Anxious  Affect Anxious  Speech Logical/coherent  Interaction Assertive;Isolative  Motor Activity Tremors;Slow  Appearance/Hygiene Unremarkable  Behavior Characteristics Cooperative;Anxious;Appropriate to situation  Mood Anxious;Pleasant  Thought Process  Coherency WDL  Content WDL  Delusions None reported or observed  Perception WDL  Hallucination None reported or observed  Judgment Limited  Confusion WDL  Danger to Self  Current suicidal ideation? Denies  Danger to Others  Danger to Others None reported or observed

## 2020-12-10 NOTE — Group Note (Signed)
Recreation Therapy Group Note   Group Topic:Stress Management  Group Date: 12/10/2020 Start Time: 0930 End Time: 1117 Facilitators: Caroll Rancher, LRT,CTRS Location: [address] Dayroom   Goal Area(s) Addresses:  Patient will actively participate in stress management techniques presented during session.  Patient will successfully identify benefit of practicing stress management post d/c.    Group Description: Guided Imagery. LRT provided education, instruction, and demonstration on practice of visualization via guided imagery. Patient was asked to participate in the technique introduced during session. LRT debriefed including topics of mindfulness, stress management and specific scenarios each patient could use these techniques. Patients were given suggestions of ways to access scripts post d/c and encouraged to explore Youtube and other apps available on smartphones, tablets, and computers.   Affect/Mood: N/A   Participation Level: Did not attend    Clinical Observations/Individualized Feedback:     Plan: Continue to engage patient in RT group sessions 2-3x/week.   Caroll Rancher, LRT,CTRS 12/10/2020 1:10 PM

## 2020-12-11 LAB — GLUCOSE, CAPILLARY: Glucose-Capillary: 168 mg/dL — ABNORMAL HIGH (ref 70–99)

## 2020-12-11 MED ORDER — SERTRALINE HCL 25 MG PO TABS
75.0000 mg | ORAL_TABLET | Freq: Every day | ORAL | Status: DC
  Administered 2020-12-12 – 2020-12-15 (×4): 75 mg via ORAL
  Filled 2020-12-11: qty 3
  Filled 2020-12-11: qty 42
  Filled 2020-12-11: qty 3
  Filled 2020-12-11: qty 42
  Filled 2020-12-11 (×3): qty 3

## 2020-12-11 NOTE — Progress Notes (Signed)
Pt denies SI/HI/AVH and verbally agrees to approach staff if these become apparent or before harming themselves/others. Rates depression 8/10. Rates anxiety 6/10. Rates pain 6/10.  Last COWS score was a 5. Pt stated that his withdrawal symptoms are getting better. Pt stated that he thinks he will be able to get out more today. Pt was seen in the hallway and stated that he didn't want to walk out stinking and that he is going to go in his room and shower. Pt was getting his clothes washed. Pt has been in a better mood today and is becoming more motivated to get out of his room. Scheduled medications administered to Pt, per MD orders. RN provided support and encouragement to Pt. Q15 min safety checks implemented and continued. Pt safe on the unit. RN will continue to monitor and intervene as needed.   12/11/20 0939  Psych Admission Type (Psych Patients Only)  Admission Status Voluntary  Psychosocial Assessment  Patient Complaints Anxiety;Depression;Other (Comment) (pain)  Eye Contact Brief  Facial Expression Anxious  Affect Anxious;Depressed;Appropriate to circumstance  Speech Logical/coherent  Interaction Assertive;Minimal  Motor Activity Slow  Appearance/Hygiene Improved  Behavior Characteristics Cooperative;Anxious;Calm  Mood Pleasant;Anxious;Depressed  Thought Process  Coherency WDL  Content WDL  Delusions None reported or observed  Perception WDL  Hallucination None reported or observed  Judgment Impaired  Confusion None  Danger to Self  Current suicidal ideation? Denies  Danger to Others  Danger to Others None reported or observed

## 2020-12-11 NOTE — Plan of Care (Signed)
  Problem: Education: Goal: Emotional status will improve Outcome: Not Progressing Goal: Mental status will improve Outcome: Not Progressing Goal: Verbalization of understanding the information provided will improve Outcome: Not Progressing   

## 2020-12-11 NOTE — [facility] Suicide Risk Assessment (Signed)
[facility] INPATIENT:  Family/Significant Other Suicide Prevention Education  Suicide Prevention Education:  Education Completed; [place], [phone], mother  (name of family member/significant other) has been identified by the patient as the family member/significant other with whom the patient will be residing, and identified as the person(s) who will aid the patient in the event of a mental health crisis (suicidal ideations/suicide attempt).  With written consent from the patient, the family member/significant other has been provided the following suicide prevention education, prior to the and/or following the discharge of the patient.  CSW called patients mother.  Patients mother reports ongoing issues with his substance use for the past 25 years.  Mother in agreeance that patient needs to go to a rehab facility. Mother is encouraged by patient participation in discharge planning.  Mother reports that she will try to figure out how to get patient ID to the hospital so that patient can travel to [place] for residential rehab.  Mother reports that they will give ID to hospital on Friday or over the weekend.  Mother asked what kind of items patient will need for his stay in [place] and CSW agreed to follow up with residential facility and call with that information.    The suicide prevention education provided includes the following: Suicide risk factors Suicide prevention and interventions National Suicide Hotline telephone number [facility] assessment telephone number [facility] Emergency Assistance 911 [facility] and/or Residential Mobile Crisis Unit telephone number  Request made of family/significant other to: Remove weapons (e.g., guns, rifles, knives), all items previously/currently identified as safety concern.   Remove drugs/medications (over-the-counter, prescriptions, illicit drugs), all items previously/currently identified as a safety concern.  The family  member/significant other verbalizes understanding of the suicide prevention education information provided.  The family member/significant other agrees to remove the items of safety concern listed above.  Tamu Golz E Shelby Peltz 12/11/2020, 11:06 AM

## 2020-12-11 NOTE — [facility] Group Notes (Signed)
Orientation group and PsychoED group. Patients were given a poem by Buddist Philosopher Lao Tzu regarding the power of our thoughts. Patients were then given examples of how positive reframing can help shape our thoughts and impact our mood. Patients were then asked to explore one negative thought they would like to let go of. Patient did not attend. 

## 2020-12-11 NOTE — Progress Notes (Signed)
CSW met with patient and assisted patient in calling addiction center for admission into treatment.  They requested a specific discharge date and progress notes daily until discharge.  After center has discharge date they will call for transportation arrangement to get there.    Nevae Pinnix, LCSW, [place] Social Worker  [facility]

## 2020-12-11 NOTE — Progress Notes (Addendum)
[facility] MD Progress Note  12/11/2020 1:56 PM Joshua Thomas  MRN:  [id number]  Subjective:  Patient is a 45 year old male with extensive history of polysubstance abuse, substance-induced mood disorder, attempted suicide of breath heroin, multiple ED visits for substance abuse, had multiple psychiatric hospitalization presents involuntarily to [facility] from [facility] ED.  Chart Review, 24 hr Events: The patient's chart was reviewed and nursing notes were reviewed. The patient's case was discussed in multidisciplinary team meeting.  Per MAR: - Patient is compliant with scheduled meds. - Agitation PRNs: none Per RN notes, no documented behavioral issues and is not attending group. Patient slept, unknown number of hours  TODAY'S INTERVIEW Patient seen and assessed with attending Dr. Caswell Corwin in patient's room.  Patient reports he is doing better than yesterday.  Patient reports still feeling depressed and anxious but this was improved as he has been accepted into a rehab facility in [place].  Patient is enthusiastic about rehabilitation and is future oriented.  Discussed plan to continue symptomatic management of patient's withdrawal with clonidine taper.  In addition discussed plan to increase Zoloft to 75 mg to manage refractory depressive symptoms.. Patient verbalized understanding and had no other questions at this time.  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 01/27/2012   COPD (chronic obstructive pulmonary disease) (HCC)    DDD (degenerative disc disease), lumbar    Degenerative disc disease, lumbar    Depression    Discitis    GERD (gastroesophageal reflux disease)    Hepatitis C    Hepatitis C    History of stomach ulcers    Hx of diabetes mellitus    due to infection   Hypertension    Lung nodules    3 on L and 2 on R   PTSD (post-traumatic stress disorder)    Sciatica    Substance abuse ([place])     Past Surgical History:  Procedure Laterality Date   FRACTURE SURGERY     HARDWARE REMOVAL  01/27/2012   Procedure: HARDWARE REMOVAL;  Surgeon: Mcarthur Rossetti, MD;  Location: [facility];  Service: Orthopedics;  Laterality: Left;   IR LUMBAR [place] W/IMG GUIDE  06/08/2017   RADIOLOGY WITH ANESTHESIA N/A 06/08/2017   Procedure: DISC ASPIRATION;  Surgeon: Luanne Bras, MD;  Location: [place];  Service: Radiology;  Laterality: N/A;   TIBIA IM NAIL INSERTION  01/27/2012   Procedure: INTRAMEDULLARY (IM) NAIL TIBIAL;  Surgeon: Mcarthur Rossetti, MD;  Location: [facility];  Service: Orthopedics;  Laterality: Left;  Removal of IM Rod and Screws Left Tibia with Exchange Nail, Allograft Bone Graft Left Tibia   Family History:  Family History  Problem Relation Age of Onset   Diabetes Mother    Alcohol abuse Father    Cirrhosis Father    Aneurysm Father    Family Psychiatric  History: see H&P Social History:  Social History   Substance and  Sexual Activity  Alcohol Use Not Currently   Alcohol/week: 1.0 standard drink   Types: 1 Standard drinks or equivalent per week   Comment: occasionally     Social History   Substance and Sexual Activity  Drug Use Yes   Types: IV, Fentanyl, Benzodiazepines   Comment: uses fentanyl every day    Social History   Socioeconomic History   Marital status: Single    Spouse name: Not on file   Number of children: Not on file   Years of education: 12    Highest education level: High school graduate  Occupational History   Not on file  Tobacco Use   Smoking status: Every Day    Packs/day: 1.00    Years: 33.00    Pack years: 33.00    Types: Cigarettes   Smokeless tobacco: Former    Quit date: 08/08/2006   Tobacco comments:    1 PPD  Substance and Sexual Activity   Alcohol use: Not Currently    Alcohol/week: 1.0 standard drink    Types: 1 Standard drinks or equivalent per week    Comment: occasionally   Drug use: Yes    Types: IV, Fentanyl, Benzodiazepines    Comment: uses fentanyl every day   Sexual activity: Not Currently  Other Topics Concern   Not on file  Social History Narrative   Not on file   Social Determinants of Health   Financial Resource Strain: Not on file  Food Insecurity: Not on file  Transportation Needs: Not on file  Physical Activity: Not on file  Stress: Not on file  Social Connections: Not on file   Additional Social History:     Sleep: Fair  Appetite:  Fair  Current Medications: Current Facility-Administered Medications  Medication Dose Route Frequency Provider Last Rate Last Admin   acetaminophen (TYLENOL) tablet 650 mg  650 mg Oral Q6H PRN Ethelene Hal, NP   650 mg at 12/08/20 2146   alum & mag hydroxide-simeth (MAALOX/MYLANTA) 200-200-20 MG/5ML suspension 30 mL  30 mL Oral Q4H PRN Ethelene Hal, NP       cloNIDine (CATAPRES) tablet 0.1 mg  0.1 mg Oral BH-qamhs Ethelene Hal, NP       Followed by   Derrill Memo ON 12/14/2020] cloNIDine (CATAPRES) tablet 0.1 mg  0.1 mg Oral QAC breakfast Ethelene Hal, NP       dicyclomine (BENTYL) tablet 20 mg  20 mg Oral Q6H PRN Ethelene Hal, NP   20 mg at 12/08/20 2146   gabapentin (NEURONTIN) capsule 300 mg  300 mg Oral TID France Ravens, MD   300 mg at 12/11/20 1332   hydrOXYzine (ATARAX/VISTARIL) tablet 25 mg  25 mg Oral Q6H PRN Ethelene Hal, NP   25 mg at 12/09/20 2210   loperamide (IMODIUM) capsule 2-4 mg  2-4  mg Oral PRN Ethelene Hal, NP       magnesium hydroxide (MILK OF MAGNESIA) suspension 30 mL  30 mL Oral Daily PRN Ethelene Hal, NP       methocarbamol (ROBAXIN) tablet 500 mg  500 mg Oral Q8H PRN Ethelene Hal, NP   500 mg at 12/11/20 [id number]   multivitamin with minerals tablet 1 tablet  1 tablet Oral Daily France Ravens, MD   1 tablet at 12/11/20 0931   naproxen (NAPROSYN) tablet 500 mg  500 mg Oral BID PRN Ethelene Hal, NP   500 mg at 12/11/20 0942   nicotine (NICODERM CQ -  dosed in mg/24 hours) patch 21 mg  21 mg Transdermal Daily France Ravens, MD   21 mg at 12/11/20 0941   ondansetron (ZOFRAN-ODT) disintegrating tablet 4 mg  4 mg Oral Q6H PRN Ethelene Hal, NP       Derrill Memo ON 12/12/2020] sertraline (ZOLOFT) tablet 75 mg  75 mg Oral Daily France Ravens, MD       traZODone (DESYREL) tablet 50 mg  50 mg Oral QHS PRN Ethelene Hal, NP   50 mg at 12/08/20 2146   traZODone (DESYREL) tablet 50 mg  50 mg Oral Aliene Altes, MD   50 mg at 12/10/20 2132    Lab Results:  Results for orders placed or performed during the hospital encounter of 12/08/20 (from the past 48 hour(s))  Glucose, capillary     Status: Abnormal   Collection Time: 12/09/20  8:28 PM  Result Value Ref Range   Glucose-Capillary 129 (H) 70 - 99 mg/dL    Comment: Glucose reference range applies only to samples taken after fasting for at least 8 hours.  Glucose, capillary     Status: Abnormal   Collection Time: 12/10/20  5:49 AM  Result Value Ref Range   Glucose-Capillary 172 (H) 70 - 99 mg/dL    Comment: Glucose reference range applies only to samples taken after fasting for at least 8 hours.   Comment 1 Notify RN   Hepatic function panel     Status: Abnormal   Collection Time: 12/10/20  6:36 AM  Result Value Ref Range   Total Protein 6.8 6.5 - 8.1 g/dL   Albumin 3.4 (L) 3.5 - 5.0 g/dL   AST 83 (H) 15 - 41 U/L   ALT 230 (H) 0 - 44 U/L   Alkaline Phosphatase 45 38 - 126 U/L   Total  Bilirubin 0.3 0.3 - 1.2 mg/dL   Bilirubin, Direct 0.1 0.0 - 0.2 mg/dL   Indirect Bilirubin 0.2 (L) 0.3 - 0.9 mg/dL    Comment: Performed at [facility], [place] [address], [place], [place] [address]  CBC     Status: Abnormal   Collection Time: 12/10/20  6:36 AM  Result Value Ref Range   WBC 4.4 4.0 - 10.5 K/uL   RBC 4.34 4.22 - 5.81 MIL/uL   Hemoglobin 13.4 13.0 - 17.0 g/dL   HCT 41.9 39.0 - 52.0 %   MCV 96.5 80.0 - 100.0 fL   MCH 30.9 26.0 - 34.0 pg   MCHC 32.0 30.0 - 36.0 g/dL   RDW 14.0 11.5 - 15.5 %   Platelets 144 (L) 150 - 400 K/uL   nRBC 0.0 0.0 - 0.2 %    Comment: Performed at [facility], [place] [address], [place] [address]  Glucose, capillary     Status: Abnormal   Collection Time: 12/11/20  5:43 AM  Result Value Ref Range   Glucose-Capillary 168 (H) 70 - 99 mg/dL    Comment: Glucose reference range applies only to samples taken after fasting for at least 8 hours.   Comment 1 Notify RN     Blood Alcohol level:  Lab Results  Component Value Date   ETH <10 11/22/2020   ETH <10 0000000    Metabolic Disorder Labs: Lab Results  Component Value Date   HGBA1C 6.5 (H) 12/09/2020   MPG 140 12/09/2020   MPG 151 10/08/2020   Lab Results  Component Value Date   PROLACTIN 31.3 (H) 05/23/2015   Lab  Results  Component Value Date   CHOL 129 12/09/2020   TRIG 84 12/09/2020   HDL 29 (L) 12/09/2020   CHOLHDL 4.4 12/09/2020   VLDL 17 12/09/2020   LDLCALC 83 12/09/2020   LDLCALC 49 10/08/2020    Physical Findings: COWS:  COWS Total Score: 5  Musculoskeletal: Strength & Muscle Tone: within normal limits Gait & Station: normal Patient leans: N/A  Psychiatric Specialty Exam:  Presentation  General Appearance: disheveled Eye Contact:Minimal  Speech: Normal Speech Volume:Normal  Handedness:Right   Mood and Affect  Mood: Less depressed more euthymic Affect: flat  Thought Process  Thought Processes:Coherent;  Linear  Descriptions of Associations:Intact  Orientation:Full (Time, Place and Person)  Thought Content:Logical  History of Schizophrenia/Schizoaffective disorder:No  Duration of Psychotic Symptoms: >6 months Hallucinations:Hallucinations: None  Ideas of Reference:None  Suicidal Thoughts: denies  Homicidal Thoughts:Homicidal Thoughts: No   Sensorium  Memory:Immediate Fair; Recent Fair; Remote Fair  Judgment:Poor  Insight:Fair   Executive Functions  Concentration:Fair  Attention Span:Fair  [place]   Psychomotor Activity  Psychomotor Activity:Psychomotor Activity: Normal   Assets  Assets:Communication Skills; Desire for Improvement; Social Support; Resilience   Sleep  Sleep:Sleep: Fair    Physical Exam: Physical Exam Vitals and nursing note reviewed.  Constitutional:      Appearance: He is normal weight. He is ill-appearing and diaphoretic.  HENT:     Head: Normocephalic and atraumatic.  Cardiovascular:     Rate and Rhythm: Bradycardia present.  Pulmonary:     Effort: Pulmonary effort is normal.  Neurological:     General: No focal deficit present.     Mental Status: He is oriented to person, place, and time.   Review of Systems  Respiratory:  Negative for shortness of breath.   Cardiovascular:  Negative for chest pain.  Gastrointestinal:  Negative for abdominal pain, constipation, diarrhea, heartburn, nausea and vomiting.  Neurological:  Negative for headaches.   Blood pressure (!) 128/93, pulse (!) 53, temperature 97.8 F (36.6 C), temperature source Oral, resp. rate 16, height 5\' 3"  (1.6 m), weight 69.4 kg, SpO2 100 %. Body mass index is 27.1 kg/m.   Treatment Plan Summary: Daily contact with patient to assess and evaluate symptoms and progress in treatment and Medication management PLAN Safety and Monitoring: Involuntary admission to inpatient psychiatric unit for safety, stabilization and  treatment Daily contact with patient to assess and evaluate symptoms and progress in treatment Patient's case to be discussed in multi-disciplinary team meeting Observation Level : q15 minute checks Vital signs: q12 hours Precautions: suicide, elopement, and assault     Psychiatric Problems Major depressive disorder, severe, recurrent episode without psychosis Opioid use disorder Substance-induced mood disorder -INCREASE home med Zoloft 50 mg to 75 mg daily for depressive symptoms -Resume home med trazodone 50 mg nightly and x1 more as needed -Resume home med gabapentin 300 mg 3 times daily for opioid withdrawal as well as anxiety -Hydroxyzine as needed for anxiety   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  We will continue to monitor.    Mild  hypocalcemia -Calcium 8.4 -Multivitamin daily   PRNs Tylenol 650 mg for mild pain Maalox/Mylanta 30 mL for indigestion Hydroxyzine 25 mg tid for anxiety Milk of Magnesia 30 mL for constipation Trazodone 50 mg for sleep   4. Discharge Planning: Social work and case management to assist with discharge planning and identification of hospital follow-up needs prior to discharge Estimated LOS: 5-7 days Discharge Concerns: Need to establish a safety plan; Medication compliance and effectiveness Discharge Goals: Return home with outpatient referrals for mental health follow-up including medication management/psychotherapy   Park Pope, MD 12/11/2020, 1:56 PM

## 2020-12-11 NOTE — Progress Notes (Signed)
     12/10/20 2130  Psych Admission Type (Psych Patients Only)  Admission Status Voluntary  Psychosocial Assessment  Patient Complaints Anxiety;Depression  Eye Contact Brief  Facial Expression Anxious  Affect Anxious  Speech Logical/coherent  Interaction Assertive;Isolative  Motor Activity Tremors;Slow  Appearance/Hygiene Unremarkable  Behavior Characteristics Cooperative;Anxious  Mood Pleasant;Anxious  Thought Process  Coherency WDL  Content WDL  Delusions None reported or observed  Perception WDL  Hallucination None reported or observed  Judgment Limited  Confusion WDL  Danger to Self  Current suicidal ideation? Denies  Danger to Others  Danger to Others None reported or observed

## 2020-12-11 NOTE — [facility] Group Notes (Signed)
[facility] Group Notes:  (Nursing/MHT/Case Management/Adjunct)  Date:  12/11/2020  Time:  9:16 PM  Type of Therapy:  Psychoeducational Skills  Participation Level:  Active  Participation Quality:  Appropriate  Affect:  Appropriate  Cognitive:  Alert and Appropriate  Insight:  Appropriate and Good  Engagement in Group:  Engaged  Modes of Intervention:  Discussion  Summary of Progress/Problems: The Pt. Was in great spirits.  He shared with the group how he was excited to attend a tx facility in [place].    Annell Greening [place] 12/11/2020, 9:16 PM

## 2020-12-12 LAB — URINALYSIS, COMPLETE (UACMP) WITH MICROSCOPIC
Bilirubin Urine: NEGATIVE
Glucose, UA: NEGATIVE mg/dL
Hgb urine dipstick: NEGATIVE
Ketones, ur: NEGATIVE mg/dL
Leukocytes,Ua: NEGATIVE
Nitrite: NEGATIVE
Protein, ur: 30 mg/dL — AB
Specific Gravity, Urine: 1.019 (ref 1.005–1.030)
pH: 7 (ref 5.0–8.0)

## 2020-12-12 LAB — HEPATIC FUNCTION PANEL
ALT: 357 U/L — ABNORMAL HIGH (ref 0–44)
AST: 190 U/L — ABNORMAL HIGH (ref 15–41)
Albumin: 3.7 g/dL (ref 3.5–5.0)
Alkaline Phosphatase: 54 U/L (ref 38–126)
Bilirubin, Direct: 0.1 mg/dL (ref 0.0–0.2)
Indirect Bilirubin: 0.3 mg/dL (ref 0.3–0.9)
Total Bilirubin: 0.4 mg/dL (ref 0.3–1.2)
Total Protein: 7 g/dL (ref 6.5–8.1)

## 2020-12-12 LAB — GLUCOSE, CAPILLARY: Glucose-Capillary: 138 mg/dL — ABNORMAL HIGH (ref 70–99)

## 2020-12-12 MED ORDER — POLYETHYLENE GLYCOL 3350 17 G PO PACK
17.0000 g | PACK | Freq: Every day | ORAL | Status: DC | PRN
  Filled 2020-12-12: qty 5

## 2020-12-12 MED ORDER — OLANZAPINE 5 MG PO TABS
5.0000 mg | ORAL_TABLET | Freq: Three times a day (TID) | ORAL | Status: DC | PRN
  Administered 2020-12-13 – 2020-12-14 (×4): 5 mg via ORAL
  Filled 2020-12-12 (×4): qty 2

## 2020-12-12 MED ORDER — OLANZAPINE 5 MG PO TABS
5.0000 mg | ORAL_TABLET | Freq: Once | ORAL | Status: AC
  Administered 2020-12-12: 5 mg via ORAL
  Filled 2020-12-12: qty 1
  Filled 2020-12-12: qty 2

## 2020-12-12 MED ORDER — QUETIAPINE FUMARATE 50 MG PO TABS
50.0000 mg | ORAL_TABLET | Freq: Every day | ORAL | Status: DC
  Administered 2020-12-12 – 2020-12-14 (×3): 50 mg via ORAL
  Filled 2020-12-12: qty 1
  Filled 2020-12-12 (×2): qty 14
  Filled 2020-12-12 (×2): qty 1

## 2020-12-12 NOTE — Group Note (Signed)
Recreation Therapy Group Note   Group Topic:Stress Management  Group Date: 12/12/2020 Start Time: 0931 End Time: 0950 Facilitators: Caroll Rancher, [place] Location: [address] Dayroom  Goal Area(s) Addresses:  Patient will identify positive stress management techniques. Patient will identify benefits of using stress management post d/c.  Group Description:  Meditation.  LRT played a meditation that focused on making the most of your day and treating each day as a new opportunity to try new things.  It's also an opportunity to correct things that may have gone wrong at previous moments.    Affect/Mood: Appropriate   Participation Level: Active   Participation Quality: Independent   Behavior: Appropriate   Speech/Thought Process: Focused   Insight: Good   Judgement: Good   Modes of Intervention: Meditation   Patient Response to Interventions:  Engaged   Education Outcome:  Acknowledges education and In group clarification offered    Clinical Observations/Individualized Feedback: Pt attended and participated in group.    Plan: Continue to engage patient in RT group sessions 2-3x/week.   Caroll Rancher, Antonietta Jewel 12/12/2020 12:23 PM

## 2020-12-12 NOTE — Plan of Care (Signed)
  Problem: Activity: Goal: Interest or engagement in activities will improve Outcome: Progressing Goal: Sleeping patterns will improve Outcome: Progressing   Problem: Coping: Goal: Ability to demonstrate self-control will improve Outcome: Progressing   

## 2020-12-12 NOTE — Progress Notes (Signed)
CSW met with patient to assist with completing financial assistance application. Patient finished financial assistance application. CSW called Sharyn Lull, Child psychotherapist, and informed of completing financial assistance application and next steps to get transportation for patient.  Sharyn Lull let this CSW know that financial assistance takes a day to review and that Joshua Thomas from [facility] would reach out on Sunday to this CSW to arrange transportation after financial assistance has gone through.   Sharyn Lull asked that patient come with at minimum a 14 day supply of medications with 1 script for for 30 day supply. CSW agreed to let providers know. CSW to call Les at [phone] to coordinate transportation on Sunday.   Sharyn Lull also discussed that they would call and get a hold of [name] by calling the nursing station and giving the code to give him information about welcome packet and other information. Any information given to [name] was agreed upon to send to CSW to keep careteam informed.  Patient is to call [phone] if he needs support at any time throughout the travel and arrangements.    Hinton Luellen, LCSW, [place] Social Worker  [facility]

## 2020-12-12 NOTE — Group Note (Signed)
Date:  12/12/2020 Time:  11:40 AM  Group Topic/Focus:  Emotional Education:   The focus of this group is to discuss what feelings/emotions are, and how they are experienced.    Participation Level:  Active  Participation Quality:  Appropriate  Affect:  Appropriate  Cognitive:  Appropriate  Insight: Lacking  Engagement in Group:  Engaged  Modes of Intervention:  Education  Additional Comments:  Educated patient is ways to promote mindfulness in order to have a better sense of well-being.  Reymundo Poll 12/12/2020, 11:40 AM

## 2020-12-12 NOTE — Progress Notes (Signed)
[facility] MD Progress Note  12/12/2020 1:26 PM Joshua Thomas  MRN:  [id number]  Subjective:  Patient is a 45 year old male with extensive history of polysubstance abuse, substance-induced mood disorder, attempted suicide overdose from heroin, multiple ED visits for substance abuse, had multiple psychiatric hospitalization presents involuntarily to [facility] from [facility] ED.  Chart Review, 24 hr Events: The patient's chart was reviewed and nursing notes were reviewed. The patient's case was discussed in multidisciplinary team meeting.  Per MAR: - Patient is compliant with scheduled meds. - Agitation PRNs: none Per RN notes, no documented behavioral issues and is not attending group. Patient slept, unknown number of hours  TODAY'S INTERVIEW Patient seen and assessed with attending Dr. Caswell Corwin in patient's room.  Patient reports he is doing better than yesterday.  Patient reports mild depression and anxiety but generally improved.  Patient endorses not feeling significant withdrawal symptoms as he has in the past several days.  Patient does endorse that he has some cravings for fentanyl but is able to manage currently.  Patient denies any GI/GU complaints.  Patient endorses some difficulty sleeping and requesting Seroquel as he has had in the past.  Patient denies significant side effects from current psychiatric medications.  Patient continues to be eager and excited about substance rehab in [place].  Discussed plan to continue symptomatic management of patient's withdrawal with clonidine taper.  In addition discussed plan to start Seroquel 50 mg to manage mood lability as well as insomnia. Patient verbalized understanding and had no other questions at this time.  Principal Problem: MDD (major depressive disorder), recurrent severe, without psychosis ([place]) Diagnosis: Principal Problem:   MDD (major depressive disorder), recurrent severe, without psychosis ([place]) Active Problems:   Substance induced  mood disorder ([place])   Opioid abuse ([place])  Total Time spent with patient: 30 minutes  Past Psychiatric History: see H&P  Past Medical History:  Past Medical History:  Diagnosis Date  . ADHD (attention deficit hyperactivity disorder)   . Bipolar 1 disorder ([place])   . Chronic back pain   . Chronic pain    chronic l leg pain  . 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 COPD (chronic obstructive pulmonary disease) ([place])   . DDD (degenerative disc disease), lumbar   . Degenerative disc disease, lumbar   . Depression   . Discitis   . GERD (gastroesophageal reflux disease)   . Hepatitis C   . Hepatitis C   . History of stomach ulcers   . Hx of diabetes mellitus    due to infection  . Hypertension   . Lung nodules    3 on L and 2 on R  . PTSD (post-traumatic stress disorder)   . Sciatica   . Substance abuse [facility])     Past Surgical History:  Procedure Laterality Date  . FRACTURE SURGERY    . HARDWARE REMOVAL  01/27/2012   Procedure: HARDWARE REMOVAL;  Surgeon: Mcarthur Rossetti, MD;  Location: [facility];  Service: Orthopedics;  Laterality: Left;  . IR LUMBAR [place] W/IMG GUIDE  06/08/2017  . RADIOLOGY WITH ANESTHESIA N/A 06/08/2017   Procedure: DISC ASPIRATION;  Surgeon: Luanne Bras, MD;  Location: [place];  Service: Radiology;  Laterality: N/A;  . TIBIA IM NAIL INSERTION  01/27/2012   Procedure: INTRAMEDULLARY (IM) NAIL TIBIAL;  Surgeon: Mcarthur Rossetti, MD;  Location: [facility];  Service: Orthopedics;  Laterality: Left;  Removal of IM Rod and Screws Left Tibia with Exchange  Nail, Allograft Bone Graft Left Tibia   Family History:  Family History  Problem Relation Age of Onset  . Diabetes Mother   . Alcohol abuse Father   . Cirrhosis Father   . Aneurysm Father    Family Psychiatric  History: see H&P Social History:  Social History   Substance and Sexual Activity  Alcohol Use Not Currently  . Alcohol/week: 1.0 standard drink  .  Types: 1 Standard drinks or equivalent per week   Comment: occasionally     Social History   Substance and Sexual Activity  Drug Use Yes  . Types: IV, Fentanyl, Benzodiazepines   Comment: uses fentanyl every day    Social History   Socioeconomic History  . Marital status: Single    Spouse name: Not on file  . Number of children: Not on file  . Years of education: 70  . Highest education level: High school graduate  Occupational History  . Not on file  Tobacco Use  . Smoking status: Every Day    Packs/day: 1.00    Years: 33.00    Pack years: 33.00    Types: Cigarettes  . Smokeless tobacco: Former    Quit date: 08/08/2006  . Tobacco comments:    1 PPD  Substance and Sexual Activity  . Alcohol use: Not Currently    Alcohol/week: 1.0 standard drink    Types: 1 Standard drinks or equivalent per week    Comment: occasionally  . Drug use: Yes    Types: IV, Fentanyl, Benzodiazepines    Comment: uses fentanyl every day  . Sexual activity: Not Currently  Other Topics Concern  . Not on file  Social History Narrative  . Not on file   Social Determinants of Health   Financial Resource Strain: Not on file  Food Insecurity: Not on file  Transportation Needs: Not on file  Physical Activity: Not on file  Stress: Not on file  Social Connections: Not on file   Additional Social History:     Sleep: Fair  Appetite:  Fair  Current Medications: Current Facility-Administered Medications  Medication Dose Route Frequency Provider Last Rate Last Admin  . acetaminophen (TYLENOL) tablet 650 mg  650 mg Oral Q6H PRN Ethelene Hal, NP   650 mg at 12/08/20 2146  . alum & mag hydroxide-simeth (MAALOX/MYLANTA) 200-200-20 MG/5ML suspension 30 mL  30 mL Oral Q4H PRN Ethelene Hal, NP      . cloNIDine (CATAPRES) tablet 0.1 mg  0.1 mg Oral BH-qamhs Ethelene Hal, NP   0.1 mg at 12/12/20 [id number]   Followed by  . [START ON 12/14/2020] cloNIDine (CATAPRES) tablet 0.1 mg   0.1 mg Oral QAC breakfast Ethelene Hal, NP      . dicyclomine (BENTYL) tablet 20 mg  20 mg Oral Q6H PRN Ethelene Hal, NP   20 mg at 12/08/20 2146  . gabapentin (NEURONTIN) capsule 300 mg  300 mg Oral TID France Ravens, MD   300 mg at 12/12/20 1201  . hydrOXYzine (ATARAX/VISTARIL) tablet 25 mg  25 mg Oral Q6H PRN Ethelene Hal, NP   25 mg at 12/11/20 1612  . loperamide (IMODIUM) capsule 2-4 mg  2-4 mg Oral PRN Ethelene Hal, NP      . magnesium hydroxide (MILK OF MAGNESIA) suspension 30 mL  30 mL Oral Daily PRN Ethelene Hal, NP      . methocarbamol (ROBAXIN) tablet 500 mg  500 mg Oral Q8H PRN Ethelene Hal,  NP   500 mg at 12/12/20 8182  . multivitamin with minerals tablet 1 tablet  1 tablet Oral Daily [place] Pope, MD   1 tablet at 12/12/20 0818  . naproxen (NAPROSYN) tablet 500 mg  500 mg Oral BID PRN Laveda Abbe, NP   500 mg at 12/12/20 0820  . nicotine (NICODERM CQ - dosed in mg/24 hours) patch 21 mg  21 mg Transdermal Daily [place] Pope, MD   21 mg at 12/12/20 0818  . ondansetron (ZOFRAN-ODT) disintegrating tablet 4 mg  4 mg Oral Q6H PRN Laveda Abbe, NP      . polyethylene glycol (MIRALAX / GLYCOLAX) packet 17 g  17 g Oral Daily PRN [place] Pope, MD      . QUEtiapine (SEROQUEL) tablet 50 mg  50 mg Oral Seabron Spates, MD      . sertraline (ZOLOFT) tablet 75 mg  75 mg Oral Daily [place] Pope, MD   75 mg at 12/12/20 0816  . traZODone (DESYREL) tablet 50 mg  50 mg Oral QHS PRN Laveda Abbe, NP   50 mg at 12/11/20 2252  . traZODone (DESYREL) tablet 50 mg  50 mg Oral QHS [place] Pope, MD   50 mg at 12/11/20 2123    Lab Results:  Results for orders placed or performed during the hospital encounter of 12/08/20 (from the past 48 hour(s))  Glucose, capillary     Status: Abnormal   Collection Time: 12/11/20  5:43 AM  Result Value Ref Range   Glucose-Capillary 168 (H) 70 - 99 mg/dL    Comment: Glucose reference range applies only to  samples taken after fasting for at least 8 hours.   Comment 1 Notify RN   Urinalysis, Complete w Microscopic     Status: Abnormal   Collection Time: 12/12/20  2:37 AM  Result Value Ref Range   Color, Urine YELLOW YELLOW   APPearance CLOUDY (A) CLEAR   Specific Gravity, Urine 1.019 1.005 - 1.030   pH 7.0 5.0 - 8.0   Glucose, UA NEGATIVE NEGATIVE mg/dL   Hgb urine dipstick NEGATIVE NEGATIVE   Bilirubin Urine NEGATIVE NEGATIVE   Ketones, ur NEGATIVE NEGATIVE mg/dL   Protein, ur 30 (A) NEGATIVE mg/dL   Nitrite NEGATIVE NEGATIVE   Leukocytes,Ua NEGATIVE NEGATIVE   RBC / HPF 0-5 0 - 5 RBC/hpf   WBC, UA 0-5 0 - 5 WBC/hpf   Bacteria, UA RARE (A) NONE SEEN   Amorphous Crystal PRESENT    Ca Oxalate Crys, UA PRESENT    Crystals PRESENT (A) NEGATIVE    Comment: Performed at [facility], [address], [place], [place] [address]  Glucose, capillary     Status: Abnormal   Collection Time: 12/12/20  5:51 AM  Result Value Ref Range   Glucose-Capillary 138 (H) 70 - 99 mg/dL    Comment: Glucose reference range applies only to samples taken after fasting for at least 8 hours.   Comment 1 Notify RN    Comment 2 Document in Chart   Hepatic function panel     Status: Abnormal   Collection Time: 12/12/20  6:15 AM  Result Value Ref Range   Total Protein 7.0 6.5 - 8.1 g/dL   Albumin 3.7 3.5 - 5.0 g/dL   AST 696 (H) 15 - 41 U/L   ALT 357 (H) 0 - 44 U/L   Alkaline Phosphatase 54 38 - 126 U/L   Total Bilirubin 0.4 0.3 - 1.2 mg/dL  Bilirubin, Direct 0.1 0.0 - 0.2 mg/dL   Indirect Bilirubin 0.3 0.3 - 0.9 mg/dL    Comment: Performed at [facility], [place] [address], [place], [place] [address]    Blood Alcohol level:  Lab Results  Component Value Date   [facility] <10 11/22/2020   ETH <10 0000000    Metabolic Disorder Labs: Lab Results  Component Value Date   HGBA1C 6.5 (H) 12/09/2020   MPG 140 12/09/2020   MPG 151 10/08/2020   Lab Results  Component  Value Date   PROLACTIN 31.3 (H) 05/23/2015   Lab Results  Component Value Date   CHOL 129 12/09/2020   TRIG 84 12/09/2020   HDL 29 (L) 12/09/2020   CHOLHDL 4.4 12/09/2020   VLDL 17 12/09/2020   LDLCALC 83 12/09/2020   LDLCALC 49 10/08/2020    Physical Findings: COWS:  COWS Total Score: 1  Musculoskeletal: Strength & Muscle Tone: within normal limits Gait & Station: normal Patient leans: N/A  Psychiatric Specialty Exam:  Presentation  General Appearance: disheveled Eye Contact:Minimal  Speech: Normal Speech Volume:Normal  Handedness:Right   Mood and Affect  Mood: Less depressed more euthymic Affect: flat  Thought Process  Thought Processes:Coherent; Linear  Descriptions of Associations:Intact  Orientation:Full (Time, Place and Person)  Thought Content:Logical  History of Schizophrenia/Schizoaffective disorder:No  Duration of Psychotic Symptoms: >6 months Hallucinations:No data recorded  Ideas of Reference:None  Suicidal Thoughts: denies  Homicidal Thoughts:No data recorded   Sensorium  Memory:Immediate Fair; Recent Fair; Remote Fair  Judgment:Poor  Insight:Fair   Executive Functions  Concentration:Fair  Attention Span:Fair  [place]   Psychomotor Activity  Psychomotor Activity:No data recorded   Assets  Assets:Communication Skills; Desire for Improvement; Social Support; Resilience   Sleep  Sleep:No data recorded    Physical Exam: Physical Exam Vitals and nursing note reviewed.  Constitutional:      Appearance: He is normal weight. He is ill-appearing and diaphoretic.  HENT:     Head: Normocephalic and atraumatic.  Cardiovascular:     Rate and Rhythm: Bradycardia present.  Pulmonary:     Effort: Pulmonary effort is normal.  Neurological:     General: No focal deficit present.     Mental Status: He is oriented to person, place, and time.   Review of Systems  Respiratory:   Negative for shortness of breath.   Cardiovascular:  Negative for chest pain.  Gastrointestinal:  Negative for abdominal pain, constipation, diarrhea, heartburn, nausea and vomiting.  Neurological:  Negative for headaches.   Blood pressure 126/87, pulse (!) 54, temperature 97.8 F (36.6 C), temperature source Oral, resp. rate 16, height 5\' 3"  (1.6 m), weight 69.4 kg, SpO2 100 %. Body mass index is 27.1 kg/m.   Treatment Plan Summary: Daily contact with patient to assess and evaluate symptoms and progress in treatment and Medication management PLAN Safety and Monitoring: Involuntary admission to inpatient psychiatric unit for safety, stabilization and treatment Daily contact with patient to assess and evaluate symptoms and progress in treatment Patient's case to be discussed in multi-disciplinary team meeting Observation Level : q15 minute checks Vital signs: q12 hours Precautions: suicide, elopement, and assault     Psychiatric Problems Major depressive disorder, severe, recurrent episode without psychosis Opioid use disorder Substance-induced mood disorder -Continue Zoloft 75 mg daily for depressive symptoms -Continue trazodone 50 mg nightly and x1 more as needed -Start Seroquel 50 mg nightly for refractory insomnia and mood lability -Continue home med gabapentin 300 mg  3 times daily for opioid withdrawal as well as anxiety -Hydroxyzine as needed for anxiety   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 Watt Climes,  recommended continue monitoring for any signs of liver failure and recommend Infectious Disease referral for treatment of HCV once discharged.   Mild hypocalcemia -Calcium 8.4 -Multivitamin daily   PRNs Tylenol 650 mg for mild pain Maalox/Mylanta 30 mL for indigestion Hydroxyzine 25 mg tid for anxiety Milk of Magnesia 30 mL for constipation Trazodone 50 mg for sleep   4. Discharge Planning: Social work and case management to assist with discharge planning and identification of hospital follow-up needs prior to discharge Estimated LOS: 5-7 days Discharge Concerns: Need to establish a safety plan; Medication compliance and effectiveness Discharge Goals: Return home with outpatient referrals for mental health follow-up including medication management/psychotherapy   France Ravens, MD 12/12/2020, 1:26 PM

## 2020-12-12 NOTE — Progress Notes (Signed)
   12/12/20 2245  Psych Admission Type (Psych Patients Only)  Admission Status Voluntary  Psychosocial Assessment  Patient Complaints Anxiety  Eye Contact Brief  Facial Expression Anxious  Affect Anxious  Speech Logical/coherent  Interaction Minimal  Motor Activity Slow  Appearance/Hygiene Improved  Behavior Characteristics Appropriate to situation  Mood Pleasant  Thought Process  Coherency WDL  Content WDL  Delusions None reported or observed  Perception WDL  Hallucination None reported or observed  Judgment Impaired  Confusion None  Danger to Self  Current suicidal ideation? Denies  Danger to Others  Danger to Others None reported or observed

## 2020-12-12 NOTE — Progress Notes (Signed)
     12/11/20 2124  Psych Admission Type (Psych Patients Only)  Admission Status Voluntary  Psychosocial Assessment  Patient Complaints Anxiety;Depression  Eye Contact Brief  Facial Expression Anxious  Affect Anxious;Depressed;Appropriate to circumstance  Speech Logical/coherent  Interaction Assertive;Minimal  Motor Activity Slow  Appearance/Hygiene Improved  Behavior Characteristics Cooperative  Mood Pleasant;Anxious  Thought Process  Coherency WDL  Content WDL  Delusions None reported or observed  Perception WDL  Hallucination None reported or observed  Judgment Impaired  Confusion None  Danger to Self  Current suicidal ideation? Denies  Danger to Others  Danger to Others None reported or observed

## 2020-12-12 NOTE — [facility] Group Notes (Signed)
Pt did not attend AA meeting this evening. Pt was invited via intercom.  

## 2020-12-12 NOTE — Progress Notes (Signed)
CSW spoke with Child psychotherapist, Sharyn Lull, on phone.  Sharyn Lull informed Education officer, museum that they called patient and they are ready to assist with transportation. Patient wanted to fill out a financial assistance application for treatment and agreed to call today to finish that for ongoing treatment.  Patient agreed to call after lunch today.  CSW met with patient and patient discussed his anxiety about getting treatment because it is something he never thought was attainable.  Patient continues to desire treatment and wants to go to treatment. After patient participates in financial assistance application, this CSW can assist with setting up transportation to get there.    Macenzie Burford, LCSW, [place] Social Worker  [facility]

## 2020-12-12 NOTE — Group Note (Signed)
LCSW Group Therapy Note   Group Date: 12/12/2020 Start Time: 1300 End Time: 1400  Type of Therapy:  Group Therapy: Gratitude   Participation Level:  Did Not Attend  Summary of Progress/Problems: Did Not Attend   Chrys Racer 12/12/2020  1:31 PM

## 2020-12-12 NOTE — Group Note (Signed)
Date:  12/12/2020 Time:  11:06 AM  Group Topic/Focus:  Goals Group:   The focus of this group is to help patients establish daily goals to achieve during treatment and discuss how the patient can incorporate goal setting into their daily lives to aide in recovery.    Participation Level:  Active  Participation Quality:  Appropriate  Affect:  Appropriate  Cognitive:  Appropriate  Insight: Good  Engagement in Group:  Distracting  Modes of Intervention:  Discussion Patient is looking forward to being Discharged as he meets the criteria.  Additional Comments:    Reymundo Poll 12/12/2020, 11:06 AM

## 2020-12-13 LAB — GLUCOSE, CAPILLARY: Glucose-Capillary: 168 mg/dL — ABNORMAL HIGH (ref 70–99)

## 2020-12-13 MED ORDER — ONDANSETRON 4 MG PO TBDP: 4.0000 mg | ORAL_TABLET | Freq: Four times a day (QID) | ORAL | Status: DC | PRN

## 2020-12-13 MED ORDER — HYDROXYZINE HCL 25 MG PO TABS
25.0000 mg | ORAL_TABLET | Freq: Four times a day (QID) | ORAL | Status: DC | PRN
  Filled 2020-12-13: qty 20

## 2020-12-13 MED ORDER — METHOCARBAMOL 500 MG PO TABS
500.0000 mg | ORAL_TABLET | Freq: Three times a day (TID) | ORAL | Status: DC | PRN
  Administered 2020-12-13 – 2020-12-15 (×3): 500 mg via ORAL
  Filled 2020-12-13 (×2): qty 1

## 2020-12-13 MED ORDER — LOPERAMIDE HCL 2 MG PO CAPS: 2.0000 mg | ORAL_CAPSULE | ORAL | Status: DC | PRN

## 2020-12-13 MED ORDER — DICYCLOMINE HCL 20 MG PO TABS: 20.0000 mg | ORAL_TABLET | Freq: Four times a day (QID) | ORAL | Status: DC | PRN

## 2020-12-13 MED ORDER — NAPROXEN 500 MG PO TABS
500.0000 mg | ORAL_TABLET | Freq: Two times a day (BID) | ORAL | Status: DC | PRN
  Administered 2020-12-13 – 2020-12-14 (×2): 500 mg via ORAL
  Filled 2020-12-13: qty 1

## 2020-12-13 NOTE — Progress Notes (Signed)
   12/13/20 1700  Psych Admission Type (Psych Patients Only)  Admission Status Voluntary  Psychosocial Assessment  Patient Complaints Anxiety  Eye Contact Brief  Facial Expression Anxious  Affect Anxious  Speech Logical/coherent  Interaction Minimal  Motor Activity Slow  Appearance/Hygiene Improved  Behavior Characteristics Appropriate to situation  Mood Pleasant  Thought Process  Coherency WDL  Content WDL  Delusions None reported or observed  Perception WDL  Hallucination None reported or observed  Judgment Impaired  Confusion None  Danger to Self  Current suicidal ideation? Denies  Danger to Others  Danger to Others None reported or observed

## 2020-12-13 NOTE — Group Note (Signed)
LCSW Group Therapy   Due to high patient acuity and low staffing, group was unable to be held on 12/13/2020. The CSW supervisor as well as the on-duty [facility] was made aware.  Aldine Contes LCSWA  12:35 PM

## 2020-12-13 NOTE — Progress Notes (Signed)
[facility] Group Notes:  (Nursing/MHT/Case Management/Adjunct)  Date:  12/13/2020  Time:  2015  Type of Therapy:   wrap  up group  Participation Level:  Active  Participation Quality:  Appropriate, Attentive, Sharing, and Supportive  Affect:  Appropriate  Cognitive:  Alert  Insight:  Improving  Engagement in Group:  Engaged  Modes of Intervention:  Clarification, Education, and Support  Summary of Progress/Problems: Positive thinking and positive change were discussed.   Joshua Thomas 12/13/2020, 9:24 PM

## 2020-12-13 NOTE — [facility] Group Notes (Signed)
Psychoeducational Group Note    Date:12/13/2020 Time: 1300-1400    Purpose of Group: . The group focus' on teaching patients on how to identify their needs and their Life Skills:  A group where two lists are made. What people need and what are things that we do that are unhealthy. The lists are developed by the patients and it is explained that we often do the actions that are not healthy to get our list of needs met.  Goal:: to develop the coping skills needed to get their needs met  Participation Level:  did not attend [place] [facility]  

## 2020-12-13 NOTE — Progress Notes (Signed)
   12/13/20 2117  Psych Admission Type (Psych Patients Only)  Admission Status Voluntary  Psychosocial Assessment  Patient Complaints None  Eye Contact Brief;Fair  Facial Expression Anxious  Affect Appropriate to circumstance  Speech Logical/coherent  Interaction Minimal  Motor Activity Slow  Appearance/Hygiene Improved  Behavior Characteristics Cooperative;Appropriate to situation  Mood Pleasant  Thought Process  Coherency WDL  Content WDL  Delusions None reported or observed  Perception WDL  Hallucination None reported or observed  Judgment Impaired  Confusion None  Danger to Self  Current suicidal ideation? Denies  Danger to Others  Danger to Others None reported or observed

## 2020-12-13 NOTE — Progress Notes (Addendum)
[facility] MD Progress Note  12/13/2020 3:26 PM Joshua Thomas  MRN:  [id number] Subjective:   Patient is a 45 year old male with extensive history of polysubstance abuse, substance-induced mood disorder, attempted suicide overdose from heroin, multiple ED visits for substance abuse, had multiple psychiatric hospitalization presents involuntarily to [facility] from [facility] ED.  Overnight patient not noted to have any significant events.  Patient has not required any as needed's for agitation.  Patient did request parents for increased anxiety yesterday.  Patient responded well to Zyprexa 5 mg as needed.  On assessment today patient seen in the PMS patient spent more time in his bed this a.m.  Patient reports that he continues to feel very anxious as his discharge date approaches.  Patient reports that it is becoming a bit harder for him to interact with others due to his anxiety.  Patient reports he is mostly concerned about riding a airplane for the first time.  Patient endorses that he feels that the Zyprexa as needed is most beneficial for his anxiety, and reports that he believes that his anxiety will probably resolve once he is in [place] and not on the plane.  Patient reports that he has been thinking about the plane possibly losing gas or an engine.  Patient reports otherwise he feels everything else is going well.  Patient does not endorse SI, HI or AVH.  Patient reports he is also been concerned about his liver and endorses that he has noticed that he has chronic cyclical swelling in the area.  Patient and provider discussed patient's hepatitis C and again endorsed that patient should request follow-up while he is in [place] regarding hep C treatment.  Patient endorsed appreciation and understanding that the hep C was likely contributing to his elevated liver enzymes.  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   COPD (chronic obstructive pulmonary disease) (HCC)    DDD (degenerative disc disease), lumbar    Degenerative disc disease, lumbar    Depression    Discitis    GERD (gastroesophageal reflux disease)    Hepatitis C    Hepatitis C    History of stomach ulcers    Hx of diabetes mellitus    due to infection   Hypertension    Lung nodules    3 on L and 2 on R   PTSD (post-traumatic stress disorder)    Sciatica    Substance abuse ([place])     Past Surgical History:  Procedure Laterality Date   FRACTURE SURGERY     HARDWARE REMOVAL  01/27/2012   Procedure: HARDWARE REMOVAL;  Surgeon: Mcarthur Rossetti, MD;  Location: [facility];  Service: Orthopedics;  Laterality: Left;   IR LUMBAR [place] W/IMG GUIDE  06/08/2017   RADIOLOGY WITH ANESTHESIA N/A 06/08/2017   Procedure: DISC ASPIRATION;  Surgeon: Luanne Bras, MD;  Location: [facility];  Service: Radiology;  Laterality: N/A;  TIBIA IM NAIL INSERTION  01/27/2012   Procedure: INTRAMEDULLARY (IM) NAIL TIBIAL;  Surgeon: Mcarthur Rossetti, MD;  Location: [facility];  Service: Orthopedics;  Laterality: Left;  Removal of IM Rod and Screws Left Tibia with Exchange Nail, Allograft Bone Graft Left Tibia   Family History:  Family History  Problem Relation Age of Onset   Diabetes Mother    Alcohol abuse Father    Cirrhosis Father    Aneurysm Father    Family Psychiatric  History: See H&P Social History:  Social History   Substance and Sexual  Activity  Alcohol Use Not Currently   Alcohol/week: 1.0 standard drink   Types: 1 Standard drinks or equivalent per week   Comment: occasionally     Social History   Substance and Sexual Activity  Drug Use Yes   Types: IV, Fentanyl, Benzodiazepines   Comment: uses fentanyl every day    Social History   Socioeconomic History   Marital status: Single    Spouse name: Not on file   Number of children: Not on file   Years of education: 12   Highest education level: High school graduate  Occupational History   Not on file  Tobacco Use   Smoking status: Every Day    Packs/day: 1.00    Years: 33.00    Pack years: 33.00    Types: Cigarettes   Smokeless tobacco: Former    Quit date: 08/08/2006   Tobacco comments:    1 PPD  Substance and Sexual Activity   Alcohol use: Not Currently    Alcohol/week: 1.0 standard drink    Types: 1 Standard drinks or equivalent per week    Comment: occasionally   Drug use: Yes    Types: IV, Fentanyl, Benzodiazepines    Comment: uses fentanyl every day   Sexual activity: Not Currently  Other Topics Concern   Not on file  Social History Narrative   Not on file   Social Determinants of Health   Financial Resource Strain: Not on file  Food Insecurity: Not on file  Transportation Needs: Not on file  Physical Activity: Not on file  Stress: Not on file  Social Connections: Not on file   Additional Social History:                         Sleep: Fair  Appetite:  Good  Current Medications: Current Facility-Administered Medications  Medication Dose Route Frequency Provider Last Rate Last Admin   acetaminophen (TYLENOL) tablet 650 mg  650 mg Oral Q6H PRN Ethelene Hal, NP   650 mg at 12/08/20 2146   alum & mag hydroxide-simeth (MAALOX/MYLANTA) 200-200-20 MG/5ML suspension 30 mL  30 mL Oral Q4H PRN Ethelene Hal, NP       Derrill Memo ON 12/14/2020] cloNIDine (CATAPRES) tablet 0.1 mg  0.1 mg Oral QAC breakfast Ethelene Hal, NP       dicyclomine (BENTYL) tablet 20 mg  20 mg Oral Q6H PRN Ethelene Hal, NP   20 mg at 12/12/20 2125   gabapentin (NEURONTIN) capsule 300 mg  300 mg Oral TID France Ravens, MD   300 mg at 12/13/20 1255   hydrOXYzine (ATARAX/VISTARIL) tablet 25 mg  25 mg Oral Q6H PRN Ethelene Hal, NP   25 mg at 12/11/20 1612   loperamide (IMODIUM) capsule 2-4 mg  2-4 mg Oral PRN Ethelene Hal, NP       magnesium hydroxide (MILK OF MAGNESIA)  suspension 30 mL  30 mL Oral Daily PRN Ethelene Hal, NP       methocarbamol (ROBAXIN) tablet 500 mg  500 mg Oral Q8H PRN Ethelene Hal, NP   500 mg at 12/13/20 4103   multivitamin with minerals tablet 1 tablet  1 tablet Oral Daily France Ravens, MD   1 tablet at 12/13/20 0950   naproxen (NAPROSYN) tablet 500 mg  500 mg Oral BID PRN Ethelene Hal, NP   500 mg at 12/13/20 0131   nicotine (NICODERM CQ - dosed in mg/24 hours) patch 21 mg  21 mg Transdermal Daily France Ravens, MD   21 mg at 12/13/20 0953   OLANZapine (ZYPREXA) tablet 5 mg  5 mg Oral TID PRN Janine Limbo, MD       ondansetron (ZOFRAN-ODT) disintegrating tablet 4 mg  4 mg Oral Q6H PRN Ethelene Hal, NP   4 mg at 12/12/20 2127   polyethylene glycol (MIRALAX / GLYCOLAX) packet 17 g  17 g Oral Daily PRN France Ravens, MD       QUEtiapine (SEROQUEL) tablet 50 mg  50 mg Oral Aliene Altes, MD   50 mg at 12/12/20 2125   sertraline (ZOLOFT) tablet 75 mg  75 mg Oral Daily France Ravens, MD   75 mg at 12/13/20 4388   traZODone (DESYREL) tablet 50 mg  50 mg Oral QHS PRN Ethelene Hal, NP   50 mg at 12/11/20 2252   traZODone (DESYREL) tablet 50 mg  50 mg Oral Aliene Altes, MD   50 mg at 12/12/20 2128    Lab Results:  Results for orders placed or performed during the hospital encounter of 12/08/20 (from the past 48 hour(s))  Urinalysis, Complete w Microscopic Urine, Clean Catch     Status: Abnormal   Collection Time: 12/12/20  2:37 AM  Result Value Ref  Range   Color, Urine YELLOW YELLOW   APPearance CLOUDY (A) CLEAR   Specific Gravity, Urine 1.019 1.005 - 1.030   pH 7.0 5.0 - 8.0   Glucose, UA NEGATIVE NEGATIVE mg/dL   Hgb urine dipstick NEGATIVE NEGATIVE   Bilirubin Urine NEGATIVE NEGATIVE   Ketones, ur NEGATIVE NEGATIVE mg/dL   Protein, ur 30 (A) NEGATIVE mg/dL   Nitrite NEGATIVE NEGATIVE   Leukocytes,Ua NEGATIVE NEGATIVE   RBC / HPF 0-5 0 - 5 RBC/hpf   WBC, UA 0-5 0 - 5 WBC/hpf   Bacteria, UA RARE (A) NONE SEEN   Amorphous Crystal PRESENT    Ca Oxalate Crys, UA PRESENT    Crystals PRESENT (A) NEGATIVE    Comment: Performed at [facility], [place] [address], [place], [place] [address]  Glucose, capillary     Status: Abnormal   Collection Time: 12/12/20  5:51 AM  Result Value Ref Range   Glucose-Capillary 138 (H) 70 - 99 mg/dL    Comment: Glucose reference range applies only to samples taken after fasting for at least 8 hours.   Comment 1 Notify RN    Comment 2 Document in Chart   Hepatic function panel     Status: Abnormal   Collection Time: 12/12/20  6:15 AM  Result Value Ref Range   Total Protein 7.0 6.5 - 8.1 g/dL   Albumin 3.7 3.5 - 5.0 g/dL   AST 190 (H) 15 - 41 U/L   ALT 357 (H) 0 - 44 U/L   Alkaline Phosphatase 54 38 - 126 U/L   Total Bilirubin 0.4 0.3 -  1.2 mg/dL   Bilirubin, Direct 0.1 0.0 - 0.2 mg/dL   Indirect Bilirubin 0.3 0.3 - 0.9 mg/dL    Comment: Performed at [facility], [place] [address] [place], [place] [address]  Glucose, capillary     Status: Abnormal   Collection Time: 12/13/20  6:16 AM  Result Value Ref Range   Glucose-Capillary 168 (H) 70 - 99 mg/dL    Comment: Glucose reference range applies only to samples taken after fasting for at least 8 hours.    Blood Alcohol level:  Lab Results  Component Value Date   ETH <10 11/22/2020   ETH <10 31/51/7616    Metabolic Disorder Labs: Lab Results  Component Value Date   HGBA1C 6.5 (H) 12/09/2020   MPG 140  12/09/2020   MPG 151 10/08/2020   Lab Results  Component Value Date   PROLACTIN 31.3 (H) 05/23/2015   Lab Results  Component Value Date   CHOL 129 12/09/2020   TRIG 84 12/09/2020   HDL 29 (L) 12/09/2020   CHOLHDL 4.4 12/09/2020   VLDL 17 12/09/2020   LDLCALC 83 12/09/2020   LDLCALC 49 10/08/2020    Physical Findings: AIMS:  , ,  ,  ,    CIWA:  CIWA-Ar Total: 1 COWS:  COWS Total Score: 0  Musculoskeletal: Strength & Muscle Tone: within normal limits Gait & Station: normal Patient leans: N/A  Psychiatric Specialty Exam:  Presentation  General Appearance: Appropriate for Environment  Eye Contact:Good  Speech:Clear and Coherent  Speech Volume:Normal  Handedness:Right   Mood and Affect  Mood:Anxious  Affect:Appropriate   Thought Process  Thought Processes:Coherent  Descriptions of Associations:Intact  Orientation:Full (Time, Place and Person)  Thought Content:Logical  History of Schizophrenia/Schizoaffective disorder:No  Duration of Psychotic Symptoms:No data recorded Hallucinations:Hallucinations: None  Ideas of Reference:None  Suicidal Thoughts:Suicidal Thoughts: No  Homicidal Thoughts:Homicidal Thoughts: No   Sensorium  Memory:Immediate Good; Recent Good; Remote Good  Judgment:Good  Insight:Fair   Executive Functions  Concentration:Fair  Attention Span:Good  [place] of Knowledge:Good  Language:Good   Psychomotor Activity  Psychomotor Activity:Psychomotor Activity: Increased   Assets  Assets:Communication Skills; Resilience   Sleep  Sleep:Sleep: Good    Physical Exam: Physical Exam Constitutional:      Appearance: Normal appearance.  HENT:     Head: Normocephalic and atraumatic.  Eyes:     Extraocular Movements: Extraocular movements intact.  Pulmonary:     Effort: Pulmonary effort is normal.  Neurological:     Mental Status: He is alert and oriented to person, place, and time.   Review of  Systems  Psychiatric/Behavioral:  Negative for suicidal ideas. The patient is nervous/anxious.   Blood pressure 120/77, pulse (!) 53, temperature 97.6 F (36.4 C), temperature source Oral, resp. rate 16, height 5' 3"  (1.6 m), weight 69.4 kg, SpO2 99 %. Body mass index is 27.1 kg/m.   Treatment Plan Summary: Daily contact with patient to assess and evaluate symptoms and progress in treatment and Medication management Patient is a 45 year old with a past history of opioid use disorder and EtOH use disorder who is currently being treated for the substance use disorders as well as MDD.  Patient appears to be responding well to medications at this time, however patient is having the common phobia of flying.  As it is patient's first time client is not unreasonable that he may be anxious about flying to [place].  Patient endorses that he responded very well to Zyprexa as needed for his anxiety  and also endorsed that he does not feel that he would continue to require this medication once he has gotten to [place].  Patient appears to have decent insight and judgment and continues to attempt to participate in the milieu despite endorsing that his anxiety has been worsening as his flight approaches.  Overall patient's depressive symptoms appear to be subsiding.  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 MaWatt Climesrecommended continue monitoring for any signs of liver failure and recommend Infectious Disease referral for treatment of HCV once discharged.  At this time patient's swelling appears to be chronically stable and labs appear to be stabilizing.  Have again recommended the patient reach out for hep C treatment [place]   Mild hypocalcemia -Calcium 8.4 -Multivitamin daily   PRNs Tylenol 650 mg for mild pain Maalox/Mylanta 30 mL for indigestion Hydroxyzine 25 mg tid for anxiety Milk of Magnesia 30 mL for constipation Trazodone 50 mg for sleep  PGY-2 JaFreida BusmanMD 12/13/2020, 3:26 PM

## 2020-12-13 NOTE — [facility] Group Notes (Signed)
.  Psychoeducational Group Note  Date: 12/06/2020 Time: 0900-1000    Goal Setting   Purpose of Group: This group helps to provide patients with the steps of setting a goal that is specific, measurable, attainable, realistic and time specific. A discussion on how we keep ourselves stuck with negative self talk. Homework given for Patients to write 30 positive attributes about themselves.    Participation Level:  Active  Participation Quality:  Appropriate  Affect:  Appropriate  Cognitive:  Appropriate  Insight:  Improving  Engagement in Group:  Engaged  Additional Comments: RAtes energy as a 5/10  Vira Blanco A

## 2020-12-13 NOTE — [facility] Group Notes (Signed)
Pt did not attend orientation group this morning.  

## 2020-12-14 ENCOUNTER — Inpatient Hospital Stay (HOSPITAL_COMMUNITY): Payer: 59

## 2020-12-14 LAB — CBC WITH DIFFERENTIAL/PLATELET
Abs Immature Granulocytes: 0.03 10*3/uL (ref 0.00–0.07)
Basophils Absolute: 0 10*3/uL (ref 0.0–0.1)
Basophils Relative: 0 %
Eosinophils Absolute: 0.2 10*3/uL (ref 0.0–0.5)
Eosinophils Relative: 4 %
HCT: 42.7 % (ref 39.0–52.0)
Hemoglobin: 13.7 g/dL (ref 13.0–17.0)
Immature Granulocytes: 1 %
Lymphocytes Relative: 43 %
Lymphs Abs: 2.2 10*3/uL (ref 0.7–4.0)
MCH: 30.9 pg (ref 26.0–34.0)
MCHC: 32.1 g/dL (ref 30.0–36.0)
MCV: 96.2 fL (ref 80.0–100.0)
Monocytes Absolute: 0.4 10*3/uL (ref 0.1–1.0)
Monocytes Relative: 8 %
Neutro Abs: 2.3 10*3/uL (ref 1.7–7.7)
Neutrophils Relative %: 44 %
Platelets: 136 10*3/uL — ABNORMAL LOW (ref 150–400)
RBC: 4.44 MIL/uL (ref 4.22–5.81)
RDW: 14.3 % (ref 11.5–15.5)
WBC: 5.2 10*3/uL (ref 4.0–10.5)
nRBC: 0 % (ref 0.0–0.2)

## 2020-12-14 LAB — GLUCOSE, CAPILLARY: Glucose-Capillary: 206 mg/dL — ABNORMAL HIGH (ref 70–99)

## 2020-12-14 LAB — HEPATIC FUNCTION PANEL
ALT: 331 U/L — ABNORMAL HIGH (ref 0–44)
AST: 117 U/L — ABNORMAL HIGH (ref 15–41)
Albumin: 3.6 g/dL (ref 3.5–5.0)
Alkaline Phosphatase: 48 U/L (ref 38–126)
Bilirubin, Direct: 0.1 mg/dL (ref 0.0–0.2)
Indirect Bilirubin: 0.4 mg/dL (ref 0.3–0.9)
Total Bilirubin: 0.5 mg/dL (ref 0.3–1.2)
Total Protein: 6.8 g/dL (ref 6.5–8.1)

## 2020-12-14 LAB — HCV RNA QUANT: HCV Quantitative: NOT DETECTED IU/mL (ref >50)

## 2020-12-14 MED ORDER — IBUPROFEN 600 MG PO TABS
600.0000 mg | ORAL_TABLET | Freq: Four times a day (QID) | ORAL | Status: DC | PRN
  Administered 2020-12-14 – 2020-12-15 (×2): 600 mg via ORAL
  Filled 2020-12-14 (×2): qty 1

## 2020-12-14 MED ORDER — GABAPENTIN 400 MG PO CAPS
400.0000 mg | ORAL_CAPSULE | Freq: Three times a day (TID) | ORAL | Status: DC
  Administered 2020-12-14 – 2020-12-15 (×4): 400 mg via ORAL
  Filled 2020-12-14 (×3): qty 1
  Filled 2020-12-14: qty 42
  Filled 2020-12-14: qty 1
  Filled 2020-12-14: qty 42
  Filled 2020-12-14: qty 1
  Filled 2020-12-14 (×4): qty 42

## 2020-12-14 NOTE — [facility] Group Notes (Signed)
Adult Psychoeducational Group Not Date:  12/14/2020 Time:  0900-1045 Group Topic/Focus: PROGRESSIVE RELAXATION. A group where deep breathing is taught and tensing and relaxation muscle groups is used. Imagery is used as well.  Pts are asked to imagine 3 pillars that hold them up when they are not able to hold themselves up.  Participation Level:  Active  Participation Quality:  Appropriate  Affect:  Appropriate  Cognitive:  Oriented  Insight: Improving  Engagement in Group:  Engaged  Modes of Intervention:  Activity, Discussion, Education, and Support  Additional Comments:  Pt rates his energy at a 6/10. His pillars are his mother God and his son.  Dione Housekeeper
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
## 2020-12-14 NOTE — Progress Notes (Addendum)
[facility] MD Progress Note  12/14/2020 8:17 AM Joshua Thomas  MRN:  [id number] Subjective:   Patient is a 45 year old male with extensive history of polysubstance abuse, substance-induced mood disorder, attempted suicide overdose from heroin, multiple ED visits for substance abuse, had multiple psychiatric hospitalization presents involuntarily to [facility] from [facility] ED.  Overnight patient not noted to have any significant events.  Patient has not required any as needed's for agitation.  Patient did request parents for increased anxiety yesterday.  Patient responded well to Zyprexa 5 mg as needed but did require it twice yesterday.   On assessment this morning, patient appeared to be doing better.  Patient reports that he is feeling somewhat better and slightly less anxious with regards to his disposition planning to go to addiction center in [place].  Patient does report that he is most anxious about going because of the airplane.  Discussed that this is likely to happen since he has never been on an airplane and he will likely adjust appropriately.  Patient discusses his fears of the plane running out of gas.  Patient felt less anxious about this after discussion.  Patient feels that he does not feel very depressed at this time just anxious.  Discussed plan to increase his gabapentin to 400 mg 3 times daily and continue with Zyprexa as needed for anxiety.  Plan is for discharge tomorrow to addiction center in [place].  Patient verbalized agreement understanding and no other questions at this time.  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   COPD (chronic obstructive pulmonary disease) (HCC)    DDD (degenerative disc disease), lumbar    Degenerative disc disease, lumbar    Depression    Discitis    GERD (gastroesophageal reflux disease)    Hepatitis C    Hepatitis C    History of stomach ulcers    Hx of diabetes mellitus    due to infection   Hypertension    Lung nodules    3 on L and 2 on R   PTSD (post-traumatic stress disorder)    Sciatica    Substance abuse ([place])     Past Surgical History:  Procedure Laterality Date   FRACTURE SURGERY     HARDWARE REMOVAL  01/27/2012   Procedure: HARDWARE REMOVAL;  Surgeon: Mcarthur Rossetti, MD;  Location: [facility];  Service: Orthopedics;  Laterality: Left;   IR LUMBAR [place] W/IMG GUIDE  06/08/2017   RADIOLOGY WITH ANESTHESIA N/A 06/08/2017   Procedure: DISC ASPIRATION;  Surgeon: Luanne Bras, MD;  Location: [facility];  Service: Radiology;  Laterality: N/A;   TIBIA IM NAIL INSERTION  01/27/2012   Procedure: INTRAMEDULLARY (IM) NAIL TIBIAL;  Surgeon: Mcarthur Rossetti, MD;  Location: [facility];  Service: Orthopedics;  Laterality: Left;  Removal of IM Rod and Screws Left Tibia with Exchange Nail, Allograft Bone Graft Left Tibia   Family History:  Family History  Problem Relation Age of Onset   Diabetes Mother    Alcohol abuse Father    Cirrhosis Father    Aneurysm Father    Family Psychiatric  History: See H&P Social History:  Social History   Substance and Sexual Activity  Alcohol Use Not Currently   Alcohol/week: 1.0 standard drink   Types: 1 Standard drinks or equivalent per week   Comment: occasionally     Social History   Substance and Sexual Activity  Drug Use Yes   Types: IV, Fentanyl, Benzodiazepines   Comment: uses  fentanyl every day    Social History   Socioeconomic History   Marital status: Single    Spouse name: Not on file   Number of children: Not on file   Years of education: 12   Highest education level: High school graduate  Occupational History   Not on file  Tobacco Use   Smoking status: Every Day    Packs/day: 1.00    Years: 33.00    Pack years: 33.00    Types: Cigarettes   Smokeless tobacco: Former    Quit date: 08/08/2006   Tobacco comments:    1 PPD  Substance and Sexual Activity   Alcohol use: Not Currently    Alcohol/week: 1.0 standard drink    Types: 1 Standard drinks or equivalent per week    Comment: occasionally   Drug use: Yes    Types: IV, Fentanyl, Benzodiazepines    Comment: uses fentanyl every day   Sexual activity: Not Currently  Other Topics Concern   Not on file  Social History Narrative   Not on file   Social Determinants of Health   Financial Resource Strain: Not on file  Food Insecurity: Not on file  Transportation Needs: Not on file  Physical Activity: Not on file  Stress: Not on file  Social Connections: Not on file   Additional Social History:                         Sleep: Fair  Appetite:  Good  Current Medications: Current Facility-Administered Medications  Medication Dose Route Frequency Provider Last Rate Last Admin   acetaminophen (TYLENOL) tablet 650 mg  650 mg Oral Q6H PRN Ethelene Hal, NP   650 mg at 12/08/20 2146   alum & mag hydroxide-simeth (MAALOX/MYLANTA) 200-200-20 MG/5ML suspension 30 mL  30 mL Oral Q4H PRN Ethelene Hal, NP       cloNIDine (CATAPRES) tablet 0.1 mg  0.1 mg Oral QAC breakfast Ethelene Hal, NP       dicyclomine (BENTYL) tablet 20 mg  20 mg Oral Q6H PRN Bobbitt, Shalon E, NP       gabapentin (NEURONTIN) capsule 400 mg  400 mg Oral TID France Ravens, MD       hydrOXYzine (ATARAX) tablet 25 mg  25 mg Oral Q6H PRN Bobbitt, Shalon E, NP       loperamide (IMODIUM) capsule 2-4 mg   2-4 mg Oral PRN Bobbitt, Shalon E, NP       magnesium hydroxide (MILK OF MAGNESIA) suspension 30 mL  30 mL Oral Daily PRN Ethelene Hal, NP   30 mL at 12/13/20 1617   methocarbamol (ROBAXIN) tablet 500 mg  500 mg Oral Q8H PRN Bobbitt, Shalon E, NP   500 mg at 12/13/20 2139   multivitamin with minerals tablet 1 tablet  1 tablet Oral Daily France Ravens, MD   1  tablet at 12/13/20 0950   naproxen (NAPROSYN) tablet 500 mg  500 mg Oral BID PRN Bobbitt, Shalon E, NP   500 mg at 12/13/20 2139   nicotine (NICODERM CQ - dosed in mg/24 hours) patch 21 mg  21 mg Transdermal Daily France Ravens, MD   21 mg at 12/13/20 0953   OLANZapine (ZYPREXA) tablet 5 mg  5 mg Oral TID PRN Janine Limbo, MD   5 mg at 12/13/20 2117   ondansetron (ZOFRAN-ODT) disintegrating tablet 4 mg  4 mg Oral Q6H PRN Bobbitt, Shalon E, NP       polyethylene glycol (MIRALAX / GLYCOLAX) packet 17 g  17 g Oral Daily PRN France Ravens, MD       QUEtiapine (SEROQUEL) tablet 50 mg  50 mg Oral Aliene Altes, MD   50 mg at 12/13/20 2114   sertraline (ZOLOFT) tablet 75 mg  75 mg Oral Daily France Ravens, MD   75 mg at 12/13/20 0951   traZODone (DESYREL) tablet 50 mg  50 mg Oral QHS PRN Ethelene Hal, NP   50 mg at 12/11/20 2252   traZODone (DESYREL) tablet 50 mg  50 mg Oral Aliene Altes, MD   50 mg at 12/13/20 2114    Lab Results:  Results for orders placed or performed during the hospital encounter of 12/08/20 (from the past 48 hour(s))  Glucose, capillary     Status: Abnormal   Collection Time: 12/13/20  6:16 AM  Result Value Ref Range   Glucose-Capillary 168 (H) 70 - 99 mg/dL    Comment: Glucose reference range applies only to samples taken after fasting for at least 8 hours.  Glucose, capillary     Status: Abnormal   Collection Time: 12/14/20  6:01 AM  Result Value Ref Range   Glucose-Capillary 206 (H) 70 - 99 mg/dL    Comment: Glucose reference range applies only to samples taken after fasting for at least 8 hours.    Comment 1 Notify RN    Comment 2 Document in Chart   Hepatic function panel     Status: Abnormal   Collection Time: 12/14/20  6:32 AM  Result Value Ref Range   Total Protein 6.8 6.5 - 8.1 g/dL   Albumin 3.6 3.5 - 5.0 g/dL   AST 117 (H) 15 - 41 U/L   ALT 331 (H) 0 - 44 U/L   Alkaline Phosphatase 48 38 - 126 U/L   Total Bilirubin 0.5 0.3 - 1.2 mg/dL   Bilirubin, Direct 0.1 0.0 - 0.2 mg/dL   Indirect Bilirubin 0.4 0.3 - 0.9 mg/dL    Comment: Performed at [facility], [place] [address], [place], [place] [address]  CBC with Differential     Status: Abnormal   Collection Time: 12/14/20  6:32 AM  Result Value Ref Range   WBC 5.2 4.0 - 10.5 K/uL   RBC 4.44 4.22 - 5.81 MIL/uL   Hemoglobin 13.7 13.0 - 17.0 g/dL   HCT 42.7 39.0 - 52.0 %   MCV 96.2 80.0 - 100.0 fL   MCH 30.9 26.0 - 34.0 pg   MCHC 32.1 30.0 - 36.0 g/dL   RDW 14.3 11.5 - 15.5 %   Platelets 136 (L) 150 - 400 K/uL   nRBC 0.0 0.0 - 0.2 %   Neutrophils Relative % 44 %   Neutro Abs 2.3 1.7 - 7.7 K/uL   Lymphocytes Relative 43 %   Lymphs Abs 2.2 0.7 - 4.0 K/uL  Monocytes Relative 8 %   Monocytes Absolute 0.4 0.1 - 1.0 K/uL   Eosinophils Relative 4 %   Eosinophils Absolute 0.2 0.0 - 0.5 K/uL   Basophils Relative 0 %   Basophils Absolute 0.0 0.0 - 0.1 K/uL   Immature Granulocytes 1 %   Abs Immature Granulocytes 0.03 0.00 - 0.07 K/uL    Comment: Performed at [facility], [place] [address] [place], [place] [address]    Blood Alcohol level:  Lab Results  Component Value Date   [facility] <10 11/22/2020   ETH <10 42/68/3419    Metabolic Disorder Labs: Lab Results  Component Value Date   HGBA1C 6.5 (H) 12/09/2020   MPG 140 12/09/2020   MPG 151 10/08/2020   Lab Results  Component Value Date   PROLACTIN 31.3 (H) 05/23/2015   Lab Results  Component Value Date   CHOL 129 12/09/2020   TRIG 84 12/09/2020   HDL 29 (L) 12/09/2020   CHOLHDL 4.4 12/09/2020   VLDL 17 12/09/2020   LDLCALC 83  12/09/2020   LDLCALC 49 10/08/2020    Physical Findings: CIWA:  CIWA-Ar Total: 1 COWS:  COWS Total Score: 0  Musculoskeletal: Strength & Muscle Tone: within normal limits Gait & Station: normal Patient leans: N/A  Psychiatric Specialty Exam:  Presentation  General Appearance: Appropriate for Environment  Eye Contact:Good  Speech:Clear and Coherent  Speech Volume:Normal  Handedness:Right   Mood and Affect  Mood:Anxious  Affect:Appropriate   Thought Process  Thought Processes:Coherent  Descriptions of Associations:Intact  Orientation:Full (Time, Place and Person)  Thought Content:Logical  History of Schizophrenia/Schizoaffective disorder:No  Duration of Psychotic Symptoms:No data recorded Hallucinations:Hallucinations: None  Ideas of Reference:None  Suicidal Thoughts:Suicidal Thoughts: No  Homicidal Thoughts:Homicidal Thoughts: No   Sensorium  Memory:Immediate Good; Recent Good; Remote Good  Judgment:Good  Insight:Fair   Executive Functions  Concentration:Fair  Attention Span:Good  Trail of Knowledge:Good  Language:Good   Psychomotor Activity  Psychomotor Activity:Psychomotor Activity: Increased   Assets  Assets:Communication Skills; Resilience   Sleep  Sleep:Sleep: Good    Physical Exam: Physical Exam Constitutional:      Appearance: Normal appearance.  HENT:     Head: Normocephalic and atraumatic.  Eyes:     Extraocular Movements: Extraocular movements intact.  Pulmonary:     Effort: Pulmonary effort is normal.  Neurological:     Mental Status: He is alert and oriented to person, place, and time.   Review of Systems  Psychiatric/Behavioral:  Negative for suicidal ideas. The patient is nervous/anxious.   Blood pressure (!) 113/94, pulse 66, temperature (!) 97.3 F (36.3 C), resp. rate 16, height 5' 3" (1.6 m), weight 69.4 kg, SpO2 100 %. Body mass index is 27.1 kg/m.   Treatment Plan Summary: Daily  contact with patient to assess and evaluate symptoms and progress in treatment and Medication management Patient is a 44 year old with a past history of opioid use disorder and EtOH use disorder who is currently being treated for the substance use disorders as well as MDD.  Patient appears to be responding well to medications at this time.  Patient's depressive symptoms do not seem to be the primary concern at this time.  Patient's anxiety does seem to still be elevated but we will be increasing the gabapentin as well as continuing with as needed Zyprexa in order to manage it.  Awaiting transportation plans for tomorrow and we will discharge him accordingly to this.  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 MaWatt Climesrecommended continue monitoring for any signs of liver failure and recommend Infectious Disease referral for treatment of HCV once discharged.  At this time patient's  swelling appears to be chronically stable and labs appear to be stabilizing.  Have again recommended the patient reach out for hep C [place].   Mild hypocalcemia -Calcium 8.4 -Multivitamin daily   PRNs Tylenol 650 mg for mild pain Maalox/Mylanta 30 mL for indigestion Hydroxyzine 25 mg tid for anxiety Milk of Magnesia 30 mL for constipation Trazodone 50 mg for sleep  AnFrance RavensMD 12/14/2020, 8:17 AM

## 2020-12-14 NOTE — Progress Notes (Signed)
Joshua Thomas, from [employer], called to set up plane ticket to get to treatment. Patient will be leaving out of [place] and going to [place] and then to [place]. Patient plane leaving from [place] airport at 5:42pm.  He will be ready to discharge around 3pm.    Joshua Feldkamp, LCSW, LCAS Clincal Social Worker  [facility]

## 2020-12-14 NOTE — Plan of Care (Signed)
Nurse discussed anxiety, depression and coping skills with patient.  

## 2020-12-14 NOTE — Progress Notes (Signed)
D:  Patient's self inventory sheet, patient sleeps good.  Good appetite, normal energy level, poor concentration.  Hopeless #5, anxiety #5.  Denied withdrawals.  Checked irritability.  Denied physical problems, pain, back, worst pain #7 in past 24 hours.  Goal is stay out of bed.  Plans to talk to people.  Does have discharge plans. A:  Medications administered per MD orders.  Emotional support and encouragement given patient. R:  Safety maintained with 15 minute checks.  Denied SI and HI, contracts for safety.  Denied A/V hallucinations.

## 2020-12-14 NOTE — Progress Notes (Signed)
CSW called Les from the addiction center to follow up about next steps to get patient to [place].  Romilda Joy reports that patient never called on Friday to answer additional financial assistance questions so they can submit the application to the addiction center.  CSW met with patient and patient reports that he wasn't feeling well but he would call.  CSW observed patient calling Les from the addiction center.   CSW called Les back later to see next steps about getting transportation for patient.  Les reports that he submitted additional financial assistance information and he should have a response later tonight or early tomorrow morning. Les gets back in the office at [place] and will be reaching out to [place] then to go forward. CSW let them know that patient is expected to be discharged around 11am tomorrow morning. At this time, CSW can not confirm when patient will be able to have transportation secured.    Glenn Christo, LCSW, [place] Social Worker  [facility]

## 2020-12-14 NOTE — Progress Notes (Signed)
[facility] Group Notes:  (Nursing/MHT/Case Management/Adjunct)  Date:  12/14/2020  Time:  2015  Type of Therapy:   wrap up group  Participation Level:  Active  Participation Quality:  Appropriate, Attentive, Sharing, and Supportive  Affect:  Appropriate  Cognitive:  Alert  Insight:  Improving  Engagement in Group:  Engaged  Modes of Intervention:  Clarification, Socialization, and Support  Summary of Progress/Problems: Positive thinking and self-care were discussed.   Marcille Buffy 12/14/2020, 9:27 PM

## 2020-12-14 NOTE — [facility] Group Notes (Signed)
Psychoeducational Group Note  Date:  12/14/2020 Time:  1300-1400   Group Topic/Focus: This is a continuation of the group from Saturday. Pt's have been asked to formulate a list of 30 positives about themselves. This list is to be read 2 times a day for 30 days, looking in a mirror. Changing patterns of negative self talk. Also discussed is the fact that there have been some people who hurt [place] in the past. We keep that memory alive within [place]. Ways to cope with this are discused   Participation Level:  Active  Participation Quality:  Appropriate  Affect:  Appropriate  Cognitive:  Oriented  Insight: Improving  Engagement in Group:  Engaged  Modes of Intervention:  Activity, Discussion, Education, and Support  Additional Comments:  Pt rates his energy at 5/10. Gave and received feedback.  Dione Housekeeper

## 2020-12-15 ENCOUNTER — Encounter (HOSPITAL_COMMUNITY): Payer: Self-pay

## 2020-12-15 DIAGNOSIS — B1920 Unspecified viral hepatitis C without hepatic coma: Secondary | ICD-10-CM

## 2020-12-15 DIAGNOSIS — F1120 Opioid dependence, uncomplicated: Secondary | ICD-10-CM

## 2020-12-15 LAB — GLUCOSE, CAPILLARY: Glucose-Capillary: 143 mg/dL — ABNORMAL HIGH (ref 70–99)

## 2020-12-15 MED ORDER — QUETIAPINE FUMARATE 50 MG PO TABS: 50.0000 mg | ORAL_TABLET | Freq: Every day | ORAL | 0 refills | Status: DC

## 2020-12-15 MED ORDER — HYDROXYZINE HCL 25 MG PO TABS: 25.0000 mg | ORAL_TABLET | Freq: Four times a day (QID) | ORAL | 0 refills | Status: DC | PRN

## 2020-12-15 MED ORDER — SERTRALINE HCL 25 MG PO TABS: 75.0000 mg | ORAL_TABLET | Freq: Every day | ORAL | 0 refills | Status: DC

## 2020-12-15 MED ORDER — OLANZAPINE 5 MG PO TABS: 5.0000 mg | ORAL_TABLET | Freq: Two times a day (BID) | ORAL | 0 refills | Status: DC

## 2020-12-15 MED ORDER — AMLODIPINE BESYLATE 5 MG PO TABS: 5.0000 mg | ORAL_TABLET | Freq: Every day | ORAL | 0 refills | Status: DC

## 2020-12-15 MED ORDER — NICOTINE 21 MG/24HR TD PT24: 21.0000 mg | MEDICATED_PATCH | Freq: Every day | TRANSDERMAL | 0 refills | Status: AC

## 2020-12-15 MED ORDER — AMLODIPINE BESYLATE 5 MG PO TABS
5.0000 mg | ORAL_TABLET | Freq: Every day | ORAL | Status: DC
  Administered 2020-12-15: 5 mg via ORAL
  Filled 2020-12-15 (×2): qty 14
  Filled 2020-12-15 (×2): qty 1

## 2020-12-15 MED ORDER — OLANZAPINE 5 MG PO TABS
5.0000 mg | ORAL_TABLET | Freq: Two times a day (BID) | ORAL | Status: DC
  Administered 2020-12-15: 5 mg via ORAL
  Filled 2020-12-15: qty 28
  Filled 2020-12-15 (×2): qty 1
  Filled 2020-12-15 (×3): qty 28

## 2020-12-15 MED ORDER — POLYETHYLENE GLYCOL 3350 17 G PO PACK
17.0000 g | PACK | Freq: Every day | ORAL | 0 refills | Status: AC | PRN
Start: 2020-12-15 — End: 2021-01-14

## 2020-12-15 MED ORDER — TRAZODONE HCL 50 MG PO TABS: 50.0000 mg | ORAL_TABLET | Freq: Every day | ORAL | 0 refills | Status: DC

## 2020-12-15 MED ORDER — GABAPENTIN 400 MG PO CAPS: 400.0000 mg | ORAL_CAPSULE | Freq: Three times a day (TID) | ORAL | 0 refills | Status: DC

## 2020-12-15 NOTE — Progress Notes (Signed)
D:  Patient's self inventory sheet, patient sleeps good, sleep medication helpfujl.  Good appetite, normal energy level, good concentration.  Rated depression 7, hopeless 5.  Withdrawals.  Denied SI.  Physical problems, back pain.  Goal is talk to SW and be discharged.   A:  Medications administered per MD orders.  Emotional support and encouragement given patient. R:  Denied SI and HI, contracts for safety.  Denied A/V hallucinations.  Safety maintained with 15 minute checks

## 2020-12-15 NOTE — Group Note (Deleted)
LCSW Group Therapy Note   Group Date: 12/15/2020 Start Time: 1300 End Time: 1400   Type of Therapy and Topic:  Group Therapy: Boundaries  Participation Level:  {[facility] PARTICIPATION LEVEL:22264}  Description of Group: This group will address the use of boundaries in their personal lives. Patients will explore why boundaries are important, the difference between healthy and unhealthy boundaries, and negative and postive outcomes of different boundaries and will look at how boundaries can be crossed.  Patients will be encouraged to identify current boundaries in their own lives and identify what kind of boundary is being set. Facilitators will guide patients in utilizing problem-solving interventions to address and correct types boundaries being used and to address when no boundary is being used. Understanding and applying boundaries will be explored and addressed for obtaining and maintaining a balanced life. Patients will be encouraged to explore ways to assertively make their boundaries and needs known to significant others in their lives, using other group members and facilitator for role play, support, and feedback.  Therapeutic Goals:  1.  Patient will identify areas in their life where setting clear boundaries could be  used to improve their life.  2.  Patient will identify signs/triggers that a boundary is not being respected. 3.  Patient will identify two ways to set boundaries in order to achieve balance in  their lives: 4.  Patient will demonstrate ability to communicate their needs and set boundaries  through discussion and/or role plays  Summary of Patient Progress:  *** was ***present/active throughout the session and proved open to feedback from CSW and peers. Patient demonstrated *** insight into the subject matter, was respectful of peers, and was present throughout the entire session.  Therapeutic Modalities:   Cognitive Behavioral Therapy Solution-Focused Therapy  Jolina Symonds E  Diona Peregoy, LCSWA 12/15/2020  2:31 PM    

## 2020-12-15 NOTE — [facility] Group Notes (Signed)
Patient did not attend group.

## 2020-12-15 NOTE — BH IP Treatment Plan (Signed)
Interdisciplinary Treatment and Diagnostic Plan Update  12/15/2020 Time of Session: 9:40am Joshua Thomas MRN: [id number]  Principal Diagnosis: MDD (major depressive disorder), recurrent severe, without psychosis ([place])  Secondary Diagnoses: Principal Problem:   MDD (major depressive disorder), recurrent severe, without psychosis ([place]) Active Problems:   Opioid use disorder, severe, dependence ([place])   Hepatitis C   Current Medications:  Current Facility-Administered Medications  Medication Dose Route Frequency Provider Last Rate Last Admin   acetaminophen (TYLENOL) tablet 650 mg  650 mg Oral Q6H PRN Ethelene Hal, NP   650 mg at 12/14/20 1308   alum & mag hydroxide-simeth (MAALOX/MYLANTA) 200-200-20 MG/5ML suspension 30 mL  30 mL Oral Q4H PRN Ethelene Hal, NP       amLODipine (NORVASC) tablet 5 mg  5 mg Oral Daily France Ravens, MD   5 mg at 12/15/20 0945   dicyclomine (BENTYL) tablet 20 mg  20 mg Oral Q6H PRN Bobbitt, Shalon E, NP       gabapentin (NEURONTIN) capsule 400 mg  400 mg Oral TID France Ravens, MD   400 mg at 12/15/20 [id number]   hydrOXYzine (ATARAX) tablet 25 mg  25 mg Oral Q6H PRN Bobbitt, Shalon E, NP       ibuprofen (ADVIL) tablet 600 mg  600 mg Oral Q6H PRN Nwoko, Uchenna E, PA   600 mg at 12/15/20 1012   loperamide (IMODIUM) capsule 2-4 mg  2-4 mg Oral PRN Bobbitt, Shalon E, NP       magnesium hydroxide (MILK OF MAGNESIA) suspension 30 mL  30 mL Oral Daily PRN Ethelene Hal, NP   30 mL at 12/13/20 1617   methocarbamol (ROBAXIN) tablet 500 mg  500 mg Oral Q8H PRN Bobbitt, Shalon E, NP   500 mg at 12/15/20 [id number]   multivitamin with minerals tablet 1 tablet  1 tablet Oral Daily France Ravens, MD   1 tablet at 12/15/20 [id number]   nicotine (NICODERM CQ - dosed in mg/24 hours) patch 21 mg  21 mg Transdermal Daily France Ravens, MD   21 mg at 12/15/20 0823   OLANZapine (ZYPREXA) tablet 5 mg  5 mg Oral BID France Ravens, MD   5 mg at 12/15/20 0945   ondansetron (ZOFRAN-ODT)  disintegrating tablet 4 mg  4 mg Oral Q6H PRN Bobbitt, Shalon E, NP       polyethylene glycol (MIRALAX / GLYCOLAX) packet 17 g  17 g Oral Daily PRN France Ravens, MD       QUEtiapine (SEROQUEL) tablet 50 mg  50 mg Oral QHS France Ravens, MD   50 mg at 12/14/20 2112   sertraline (ZOLOFT) tablet 75 mg  75 mg Oral Daily France Ravens, MD   75 mg at 12/15/20 [id number]   traZODone (DESYREL) tablet 50 mg  50 mg Oral QHS PRN Ethelene Hal, NP   50 mg at 12/14/20 2210   traZODone (DESYREL) tablet 50 mg  50 mg Oral Aliene Altes, MD   50 mg at 12/14/20 2114   PTA Medications: Medications Prior to Admission  Medication Sig Dispense Refill Last Dose   gabapentin (NEURONTIN) 300 MG capsule Take 1 capsule (300 mg total) by mouth 3 (three) times daily. 90 capsule 0    ibuprofen (ADVIL) 600 MG tablet Take 1 tablet (600 mg total) by mouth every 6 (six) hours as needed. 30 tablet 0    metFORMIN (GLUCOPHAGE-XR) 500 MG 24 hr tablet Take 1 tablet (500 mg total) by mouth 2 (two) times  daily with a meal. (Patient not taking: Reported on 10/08/2020) 60 tablet 0    naloxone (NARCAN) nasal spray 4 mg/0.1 mL Take in case of overdose 1 each 0    nicotine (NICODERM CQ - DOSED IN MG/24 HOURS) 21 mg/24hr patch Place 1 patch (21 mg total) onto the skin daily at 6 (six) AM. (Patient not taking: Reported on 10/08/2020) 28 patch 0    sertraline (ZOLOFT) 100 MG tablet Take 100 mg by mouth at bedtime.      sertraline (ZOLOFT) 50 MG tablet Take 1 tablet (50 mg total) by mouth at bedtime. (Patient not taking: Reported on 10/08/2020) 30 tablet 0    traZODone (DESYREL) 50 MG tablet Take 1 tablet (50 mg total) by mouth at bedtime as needed for sleep. (Patient not taking: Reported on 10/08/2020) 30 tablet 0    [DISCONTINUED] QUEtiapine (SEROQUEL) 50 MG tablet Take 50 mg by mouth at bedtime.       Patient Stressors: Substance abuse    Patient Strengths: Active sense of humor  Average or above average intelligence  Supportive family/friends    Treatment Modalities: Medication Management, Group therapy, Case management,  1 to 1 session with clinician, Psychoeducation, Recreational therapy.   Physician Treatment Plan for Primary Diagnosis: MDD (major depressive disorder), recurrent severe, without psychosis (H[facility]) Long Term Goal(s): Improvement in symptoms so as ready for discharge   Short Term Goals: Ability to identify changes in lifestyle to reduce recurrence of condition will improve Ability to verbalize feelings will improve Ability to disclose and discuss suicidal ideas Ability to demonstrate self-control will improve Ability to identify and develop effective coping behaviors will improve Ability to maintain clinical measurements within normal limits will improve Compliance with prescribed medications will improve Ability to identify triggers associated with substance abuse/mental health issues will improve  Medication Management: Evaluate patient's response, side effects, and tolerance of medication regimen.  Therapeutic Interventions: 1 to 1 sessions, Unit Group sessions and Medication administration.  Evaluation of Outcomes: Adequate for Discharge  Physician Treatment Plan for Secondary Diagnosis: Principal Problem:   MDD (major depressive disorder), recurrent severe, without psychosis (HCC) Active Problems:   Opioid use disorder, severe, dependence (HCC)   Hepatitis C  Long Term Goal(s): Improvement in symptoms so as ready for discharge   Short Term Goals: Ability to identify changes in lifestyle to reduce recurrence of condition will improve Ability to verbalize feelings will improve Ability to disclose and discuss suicidal ideas Ability to demonstrate self-control will improve Ability to identify and develop effective coping behaviors will improve Ability to maintain clinical measurements within normal limits will improve Compliance with prescribed medications will improve Ability to identify triggers  associated with substance abuse/mental health issues will improve     Medication Management: Evaluate patient's response, side effects, and tolerance of medication regimen.  Therapeutic Interventions: 1 to 1 sessions, Unit Group sessions and Medication administration.  Evaluation of Outcomes: Adequate for Discharge   RN Treatment Plan for Primary Diagnosis: MDD (major depressive disorder), recurrent severe, without psychosis (HCC) Long Term Goal(s): Knowledge of disease and therapeutic regimen to maintain health will improve  Short Term Goals: Ability to remain free from injury will improve, Ability to verbalize frustration and anger appropriately will improve, Ability to demonstrate self-control, Ability to identify and develop effective coping behaviors will improve, and Compliance with prescribed medications will improve  Medication Management: RN will administer medications as ordered by provider, will assess and evaluate patient's response and provide education to patient for prescribed medication.  RN will report any adverse and/or side effects to prescribing provider.  Therapeutic Interventions: 1 on 1 counseling sessions, Psychoeducation, Medication administration, Evaluate responses to treatment, Monitor vital signs and CBGs as ordered, Perform/monitor CIWA, COWS, AIMS and Fall Risk screenings as ordered, Perform wound care treatments as ordered.  Evaluation of Outcomes: Adequate for Discharge   LCSW Treatment Plan for Primary Diagnosis: MDD (major depressive disorder), recurrent severe, without psychosis ([place]) Long Term Goal(s): Safe transition to appropriate next level of care at discharge, Engage patient in therapeutic group addressing interpersonal concerns.  Short Term Goals: Engage patient in aftercare planning with referrals and resources, Increase social support, Facilitate patient progression through stages of change regarding substance use diagnoses and concerns, Identify  triggers associated with mental health/substance abuse issues, and Increase skills for wellness and recovery  Therapeutic Interventions: Assess for all discharge needs, 1 to 1 time with Social worker, Explore available resources and support systems, Assess for adequacy in community support network, Educate family and significant other(s) on suicide prevention, Complete Psychosocial Assessment, Interpersonal group therapy.  Evaluation of Outcomes: Adequate for Discharge   Progress in Treatment: Attending groups: Yes. and No. Participating in groups: Yes. and No. Taking medication as prescribed: Yes. Toleration medication: Yes. Family/Significant other contact made: Yes, individual(s) contacted:  mother Patient understands diagnosis: Yes. Discussing patient identified problems/goals with staff: Yes. Medical problems stabilized or resolved: Yes. Denies suicidal/homicidal ideation: Yes. Issues/concerns per patient self-inventory: No.   New problem(s) identified: No, Describe:  none  New Short Term/Long Term Goal(s): detox, medication management for mood stabilization; elimination of SI thoughts; development of comprehensive mental wellness/sobriety plan  Patient Goals:  Did not attend  Discharge Plan or Barriers: Pt is to discharge to [facility] in [place].   Reason for Continuation of Hospitalization: Medication stabilization  Estimated Length of Stay: Adequate to discharge    Scribe for Treatment Team: Vassie Moselle, LCSW 12/15/2020 10:21 AM

## 2020-12-15 NOTE — Progress Notes (Signed)
  [facility] Adult Case Management Discharge Plan :  Will you be returning to the same living situation after discharge:  No. Patient will be going to treatment facility At discharge, do you have transportation home?: Yes,  Family will pick patient up to go to airport Do you have the ability to pay for your medications: Yes,  insurance  Release of information consent forms completed and in the chart;  Patient's signature needed at discharge.  Patient to Follow up at:  Follow-up Information     [facility]: [facility]. Go to.   Why: You will be receiving substance use therapy at this treatment facility.  Welcome packet and other materials provided to you.  Please call above number if you have any concerns during travel and ask for Team Daneil Dan information: [address]  [place], [place] [address] [phone]                Next level of care provider has access to [facility] Link:no  Safety Planning and Suicide Prevention discussed: Yes,  Mother, Emerson Monte     Has patient been referred to the Quitline?: NA:  patient will be going to a treatment facility where they will assist with smoking cessation  Patient has been referred for addiction treatment: Yes  Arienne Gartin E Maresha Anastos, LCSW 12/15/2020, 9:35 AM
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
## 2020-12-15 NOTE — Progress Notes (Signed)
Joshua Thomas was up and visible on the unit.  He was noticed pacing and fidgety this evening.  He was redirected from walking to the 500 hall door and looking through the window.  He complained of right hand pain and Ibuprofen given as ordered.  He was offered ice pack as well.  He denied SI/HI or AVH.  He was given Zyprexa, trazodone and robaxin with good relief.  He attended evening wrap up group.  Q 15 minute checks maintained for safety.    12/14/20 2112  Psych Admission Type (Psych Patients Only)  Admission Status Voluntary  Psychosocial Assessment  Patient Complaints Anxiety;Insomnia  Eye Contact Brief;Fair  Facial Expression Anxious  Affect Appropriate to circumstance  Speech Logical/coherent  Interaction Flirtatious  Motor Activity Pacing;Fidgety  Appearance/Hygiene Improved  Behavior Characteristics Anxious;Irritable  Mood Depressed;Anxious;Labile  Thought Process  Coherency WDL  Content WDL  Delusions None reported or observed  Perception WDL  Hallucination None reported or observed  Judgment Impaired  Confusion None  Danger to Self  Current suicidal ideation? Denies  Danger to Others  Danger to Others None reported or observed

## 2020-12-15 NOTE — Progress Notes (Signed)
Discharge Note:  Patient discharged with family members.   Suicide prevention information given and discussed, patient stated he understood and had no questions.  Patient stated he received all his belongings, clothing, toiletries, misc items, etc.  Patient denied SI and HI.  Denied A/V hallucinatoins.

## 2020-12-15 NOTE — Group Note (Signed)
Recreation Therapy Group Note   Group Topic:Stress Management  Group Date: 12/15/2020 Start Time: 0931 End Time: 0945 Facilitators: Caroll Rancher, LRT,CTRS Location: [address] Hall Dayroom  Goal Area(s) Addresses:  Patient will actively participate in stress management techniques presented during session.  Patient will successfully identify benefit of practicing stress management post d/c.   Group Description: Guided Imagery. LRT provided education, instruction, and demonstration on practice of visualization via guided imagery. Patient was asked to participate in the technique introduced during session. LRT debriefed including topics of mindfulness, stress management and specific scenarios each patient could use these techniques. Patients were given suggestions of ways to access scripts post d/c and encouraged to explore Youtube and other apps available on smartphones, tablets, and computers.   Affect/Mood: N/A   Participation Level: Did not attend    Clinical Observations/Individualized Feedback:     Plan: Continue to engage patient in RT group sessions 2-3x/week.   Caroll Rancher, LRT,CTRS  12/15/2020 11:36 AM

## 2020-12-15 NOTE — Discharge Summary (Signed)
Physician Discharge Summary Note  Patient:  Joshua Thomas is an 45 y.o., male MRN:  [id number] DOB:  28-Jul-1975 Patient phone:  [phone] (home)  Patient address:   [address] [place] [address],  Total Time spent with patient: 1 hour  Date of Admission:  12/08/2020 Date of Discharge: 12/15/2020  Reason for Admission:  Patient is a 45 year old male with extensive history of polysubstance abuse, substance-induced mood disorder, attempted suicide of breath heroin, multiple ED visits for substance abuse, had multiple psychiatric hospitalization presents involuntarily to [facility] from [facility]  PER H&P Patient was seen resting in bed.  Patient reports that he is "same old, same old".  Patient reports that he has been using fentanyl regularly since discharge last hospitalization.  Patient reports that he attempted to go to [facility] and then that plan "kind of fell through and did not happen".  Patient continues to report depression and symptoms of depression including poor sleep, poor concentration, depressed mood, anhedonia, poor appetite, low concentration, hopelessness.  Patient denies present suicidal ideations or homicidal ideations.   Discussed patient's fentanyl use and asked why resumed fentanyl and patient reports that "I am an addict, of course I would start using again".  Patient reports he is also using methamphetamines and benzodiazepines.  Patient reports methamphetamine was last used a week ago and benzodiazepine was used with fentanyl. Patient reports he still is interested in rehabilitation.  Patient discussed with this writer being interested in an inpatient rehabilitation facility.  Discussed that we would attempt to get him one patient reports that he is okay if it is far away as he really needs to "be away from family for a bit".   Patient reports history of bipolar disorder; however, given patient's substance use this is unclear.  Patient reports some symptoms of  hypomania; however, it is unclear whether patient has actually ever had a hypomanic/manic episode that has not been precipitated by substances.   Patient denies any psychotic symptoms including delusions, auditory/visual hallucinations, ideas of reference, thought insertion/extraction.   Discussed plan to resume all home medications that patient was previously discharged on and will titrate appropriately based on how he is doing.  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   COPD (chronic obstructive pulmonary disease) (HCC)    DDD (degenerative disc disease), lumbar    Degenerative disc disease, lumbar    Depression    Discitis    GERD (gastroesophageal reflux disease)    Hepatitis C    Hepatitis C    History of stomach ulcers    Hx of diabetes mellitus    due to infection   Hypertension    Lung nodules    3 on L and 2 on R   PTSD (post-traumatic stress disorder)    Sciatica    Substance abuse ([place])     Past Surgical History:  Procedure Laterality Date   FRACTURE SURGERY  HARDWARE REMOVAL  01/27/2012   Procedure: HARDWARE REMOVAL;  Surgeon: Mcarthur Rossetti, MD;  Location: [facility];  Service: Orthopedics;  Laterality: Left;   IR LUMBAR [place] W/IMG GUIDE  06/08/2017   RADIOLOGY WITH ANESTHESIA N/A 06/08/2017   Procedure: DISC ASPIRATION;  Surgeon: Luanne Bras, MD;  Location:  [facility];  Service: Radiology;  Laterality: N/A;   TIBIA IM NAIL INSERTION  01/27/2012   Procedure: INTRAMEDULLARY (IM) NAIL TIBIAL;  Surgeon: Mcarthur Rossetti, MD;  Location: [facility];  Service: Orthopedics;  Laterality: Left;  Removal of IM Rod and Screws Left Tibia with Exchange Nail, Allograft Bone Graft Left Tibia   Family History:  Family History  Problem Relation Age of Onset   Diabetes Mother    Alcohol abuse Father    Cirrhosis Father    Aneurysm Father    Family Psychiatric  History: see H&P Social History:  Social History   Substance and Sexual Activity  Alcohol Use Not Currently   Alcohol/week: 1.0 standard drink   Types: 1 Standard drinks or equivalent per week   Comment: occasionally     Social History   Substance and Sexual Activity  Drug Use Yes   Types: IV, Fentanyl, Benzodiazepines   Comment: uses fentanyl every day    Social History   Socioeconomic History   Marital status: Single    Spouse name: Not on file   Number of children: Not on file   Years of education: 12   Highest education level: High school graduate  Occupational History   Not on file  Tobacco Use   Smoking status: Every Day    Packs/day: 1.00    Years: 33.00    Pack years: 33.00    Types: Cigarettes   Smokeless tobacco: Former    Quit date: 08/08/2006   Tobacco comments:    1 PPD  Substance and Sexual Activity   Alcohol use: Not Currently    Alcohol/week: 1.0 standard drink    Types: 1 Standard drinks or equivalent per week    Comment: occasionally   Drug use: Yes    Types: IV, Fentanyl, Benzodiazepines    Comment: uses fentanyl every day   Sexual activity: Not Currently  Other Topics Concern   Not on file  Social History Narrative   Not on file   Social Determinants of Health   Financial Resource Strain: Not on file  Food Insecurity: Not on file  Transportation Needs: Not on file  Physical Activity: Not on file  Stress: Not on file  Social Connections: Not on file     Hospital Course:   After the above admission evaluation, patient's presenting symptoms were noted. Patient was recommended for antidepressant treatment and opiate withdrawal protocol. The medication regimen targeting those presenting symptoms were discussed with patient & initiated with patient's consent. Patient was started on a clonidine taper with appropriate symptomatic management medications as needed.  Patient was also resumed on home med of Zoloft 50 mg which was titrated up to 75 mg.  Closer to end of discharge, patient started to have more anxiety and insomnia.  Patient was resumed on Seroquel 50 mg at night for insomnia.  Patient continued to feel anxious and was started on Zyprexa 5 mg as needed but was changed to scheduled 5 mg twice daily.  Due to patient's hypertension, patient was started on amlodipine 5 mg to help manage blood pressure.  Patient had no complaints throughout the hospitalization and felt that the medication regiment was  compliant. Patient endorsed improved mood once found out that he would be able to go to [place] for his addiction treatment rehab..  Patient was increasingly anxious closer to discharge due to first time flying on a plane.  Pertinent labs drawn during hospitalizations include: Platelets 136, AST 117, ALT 331, HCVRNA quant not detected, UA cloudy with protein and crystals.   During the course of patient's hospitalization, the 15-minute checks were adequate to ensure patient's safety. Patient did not exhibit erratic or aggressive behavior and was compliant with scheduled medication. Patient was recommended for outpatient substance rehabilitation.  At the time of discharge patient is not reporting any acute suicidal/homicidal ideations/AVH, delusional thoughts or paranoia. Patient did not appear to be responding to any internal stimuli. Patient feels more confident about self-care & in managing their mental health problems. Patient currently denies any new  issues or concerns. Education and supportive counseling provided throughout patient's hospital stay & upon discharge.   Today upon discharge evaluation with the attending psychiatrist Dr. Caswell Corwin, patient's mood is " great". Patient denies any specific concerns. Patient slept well, appetite good, regular bowel movements. Patient denies any physical complaints. Patient feels that the medications have been helpful & is in agreement to continue current treatment regimen as recommended. Patient was able to engage in safety planning including plan to return to [facility] or contact emergency services if patient feels unable to maintain their own safety or the safety of others. Patient had no further questions, comments, or concerns. Patient left [facility] with all personal belongings in no apparent distress. Transportation per mother to airport was arranged for patient.  Transportation to addiction center in [place] has been arranged via plane.  Physical Findings: CIWA:  CIWA-Ar Total: 1 COWS:  COWS Total Score: 1  Musculoskeletal: Strength & Muscle Tone: within normal limits Gait & Station: normal Patient leans: N/A   Psychiatric Specialty Exam:  Presentation  General Appearance: Appropriate for Environment; Casual; Fairly Groomed  Eye Contact:Good  Speech:Clear and Coherent; Normal Rate  Speech Volume:Normal  Handedness:Right   Mood and Affect  Mood:Anxious; Euthymic  Affect:Appropriate; Congruent; Full Range   Thought Process  Thought Processes:Coherent; Linear  Descriptions of Associations:Intact  Orientation:Full (Time, Place and Person)  Thought Content:Logical  History of Schizophrenia/Schizoaffective disorder:No  Duration of Psychotic Symptoms:No data recorded Hallucinations:Hallucinations: None  Ideas of Reference:None  Suicidal Thoughts:Suicidal Thoughts: No  Homicidal Thoughts:Homicidal Thoughts: No   Sensorium  Memory:Immediate Good; Recent Good; Remote  Good  Judgment:Good  Insight:Good   Executive Functions  Concentration:Good  Attention Span:Good  [place] of Knowledge:Good  Language:Good   Psychomotor Activity  Psychomotor Activity:Psychomotor Activity: Normal   Assets  Assets:Communication Skills; Resilience   Sleep  Sleep:Sleep: Good    Physical Exam: Physical Exam Vitals and nursing note reviewed.  Constitutional:      Appearance: Normal appearance. He is normal weight.  HENT:     Head: Normocephalic and atraumatic.  Pulmonary:     Effort: Pulmonary effort is normal.  Neurological:     General: No focal deficit present.     Mental Status: He is oriented to person, place, and time.   Review of Systems  Respiratory:  Negative for shortness of breath.   Cardiovascular:  Negative for chest pain.  Gastrointestinal:  Negative for abdominal pain, constipation, diarrhea, heartburn, nausea and vomiting.  Neurological:  Negative for headaches.  Blood pressure (!) 146/104, pulse 90, temperature 97.6 F (36.4 C), temperature source Oral, resp. rate 16, height 5' 3"  (1.6 m), weight  69.4 kg, SpO2 100 %. Body mass index is 27.1 kg/m.   Social History   Tobacco Use  Smoking Status Every Day   Packs/day: 1.00   Years: 33.00   Pack years: 33.00   Types: Cigarettes  Smokeless Tobacco Former   Quit date: 08/08/2006  Tobacco Comments   1 PPD   Tobacco Cessation:  N/A, patient does not currently use tobacco products   Blood Alcohol level:  Lab Results  Component Value Date   ETH <10 11/22/2020   ETH <10 16/10/9602    Metabolic Disorder Labs:  Lab Results  Component Value Date   HGBA1C 6.5 (H) 12/09/2020   MPG 140 12/09/2020   MPG 151 10/08/2020   Lab Results  Component Value Date   PROLACTIN 31.3 (H) 05/23/2015   Lab Results  Component Value Date   CHOL 129 12/09/2020   TRIG 84 12/09/2020   HDL 29 (L) 12/09/2020   CHOLHDL 4.4 12/09/2020   VLDL 17 12/09/2020   LDLCALC 83  12/09/2020   LDLCALC 49 10/08/2020    See Psychiatric Specialty Exam and Suicide Risk Assessment completed by Attending Physician prior to discharge.  Discharge destination:  Other:  [facility] in [place]  Is patient on multiple antipsychotic therapies at discharge:  Yes,   Do you recommend tapering to monotherapy for antipsychotics?  Yes   Has Patient had three or more failed trials of antipsychotic monotherapy by history:  No  Recommended Plan for Multiple Antipsychotic Therapies: Patient takes Seroquel at night for sleep and Zyprexa 5 mg twice daily for mood lability and anxiety.  Patient can be tapered off of one of them but patient appeared to be doing well while taking both.  Patient denied any significant side effects from being on both of the medications simultaneously.   Allergies as of 12/15/2020   No Known Allergies      Medication List     STOP taking these medications    metFORMIN 500 MG 24 hr tablet Commonly known as: GLUCOPHAGE-XR       TAKE these medications      Indication  amLODipine 5 MG tablet Commonly known as: NORVASC Take 1 tablet (5 mg total) by mouth daily.  Indication: High Blood Pressure Disorder   gabapentin 400 MG capsule Commonly known as: NEURONTIN Take 1 capsule (400 mg total) by mouth 3 (three) times daily. What changed:  medication strength how much to take  Indication: Social Anxiety Disorder   hydrOXYzine 25 MG tablet Commonly known as: ATARAX Take 1 tablet (25 mg total) by mouth every 6 (six) hours as needed for anxiety.  Indication: Feeling Anxious   ibuprofen 600 MG tablet Commonly known as: ADVIL Take 1 tablet (600 mg total) by mouth every 6 (six) hours as needed.  Indication: Pain   naloxone 4 MG/0.1ML Liqd nasal spray kit Commonly known as: NARCAN Take in case of overdose  Indication: Opioid Overdose   nicotine 21 mg/24hr patch Commonly known as: NICODERM CQ - dosed in mg/24 hours Place 1 patch (21 mg total)  onto the skin daily for 28 days. What changed: when to take this  Indication: Nicotine Addiction   OLANZapine 5 MG tablet Commonly known as: ZYPREXA Take 1 tablet (5 mg total) by mouth 2 (two) times daily.  Indication: Major Depressive Disorder   polyethylene glycol 17 g packet Commonly known as: MIRALAX / GLYCOLAX Take 17 g by mouth daily as needed for moderate constipation.  Indication: Constipation   QUEtiapine 50 MG  tablet Commonly known as: SEROQUEL Take 1 tablet (50 mg total) by mouth at bedtime.  Indication: Major Depressive Disorder   sertraline 25 MG tablet Commonly known as: ZOLOFT Take 3 tablets (75 mg total) by mouth daily. What changed:  medication strength how much to take when to take this Another medication with the same name was removed. Continue taking this medication, and follow the directions you see here.  Indication: Major Depressive Disorder   traZODone 50 MG tablet Commonly known as: DESYREL Take 1 tablet (50 mg total) by mouth at bedtime. What changed:  when to take this reasons to take this  Indication: [place]: [place]. Go to.   Why: You will be receiving substance use therapy at this treatment facility.  Welcome packet and other materials provided to you.  Please call above number if you have any concerns during travel and ask for [facility] [place] information: [place] [address] [phone]                 Follow-up recommendations:   Activity:  as tolerated Diet:  heart healthy   Comments:  Prescriptions were given at discharge.  Patient is agreeable with the discharge plan.  Patient was given an opportunity to ask questions.  Patient appears to feel comfortable with discharge and denies any current suicidal or homicidal thoughts.    Patient is instructed prior to discharge to: Take all medications as prescribed by mental  healthcare provider. Report any adverse effects and or reactions from the medicines to outpatient provider promptly. In the event of worsening symptoms, patient is instructed to call the crisis hotline, 911 and or go to the nearest ED for appropriate evaluation and treatment of symptoms. Patient is to follow-up with primary care provider for other medical issues, concerns and or health care needs.    Signed: France Ravens, MD 12/15/2020, 10:00 AM

## 2020-12-15 NOTE — Plan of Care (Signed)
Nurse discussed coping skills with patient.  

## 2021-03-11 DIAGNOSIS — R69 Illness, unspecified: Secondary | ICD-10-CM | POA: Diagnosis not present

## 2021-03-17 DIAGNOSIS — R69 Illness, unspecified: Secondary | ICD-10-CM | POA: Diagnosis not present

## 2021-03-23 DIAGNOSIS — Z5181 Encounter for therapeutic drug level monitoring: Secondary | ICD-10-CM | POA: Diagnosis not present

## 2021-03-23 DIAGNOSIS — F1120 Opioid dependence, uncomplicated: Secondary | ICD-10-CM | POA: Diagnosis not present

## 2021-03-23 DIAGNOSIS — Z79899 Other long term (current) drug therapy: Secondary | ICD-10-CM | POA: Diagnosis not present

## 2021-03-31 DIAGNOSIS — R69 Illness, unspecified: Secondary | ICD-10-CM | POA: Diagnosis not present

## 2021-03-31 DIAGNOSIS — Z5181 Encounter for therapeutic drug level monitoring: Secondary | ICD-10-CM | POA: Diagnosis not present

## 2021-03-31 DIAGNOSIS — Z79899 Other long term (current) drug therapy: Secondary | ICD-10-CM | POA: Diagnosis not present

## 2021-04-26 ENCOUNTER — Ambulatory Visit (HOSPITAL_COMMUNITY)
Admission: EM | Admit: 2021-04-26 | Discharge: 2021-04-26 | Disposition: A | Discharge status: Home / Self Care | Payer: 59 | Attending: Emergency Medicine | Admitting: Emergency Medicine

## 2021-04-26 ENCOUNTER — Encounter (HOSPITAL_COMMUNITY): Payer: Self-pay | Admitting: Emergency Medicine

## 2021-04-26 DIAGNOSIS — M5441 Lumbago with sciatica, right side: Secondary | ICD-10-CM | POA: Diagnosis not present

## 2021-04-26 DIAGNOSIS — M5136 Other intervertebral disc degeneration, lumbar region: Secondary | ICD-10-CM | POA: Diagnosis not present

## 2021-04-26 DIAGNOSIS — G8929 Other chronic pain: Secondary | ICD-10-CM

## 2021-04-26 DIAGNOSIS — M5442 Lumbago with sciatica, left side: Secondary | ICD-10-CM | POA: Diagnosis not present

## 2021-04-26 MED ORDER — BACLOFEN 10 MG PO TABS: 10.0000 mg | ORAL_TABLET | Freq: Three times a day (TID) | ORAL | 0 refills | Status: AC

## 2021-04-26 MED ORDER — METHYLPREDNISOLONE 4 MG PO TABS
ORAL_TABLET | ORAL | 0 refills | Status: AC
Start: 2021-04-26 — End: 2021-05-08

## 2021-04-26 MED ORDER — ACETAMINOPHEN 325 MG PO TABS
975.0000 mg | ORAL_TABLET | Freq: Once | ORAL | Status: AC
  Administered 2021-04-26: 975 mg via ORAL

## 2021-04-26 MED ORDER — ACETAMINOPHEN 325 MG PO TABS
ORAL_TABLET | ORAL | Status: AC
  Filled 2021-04-26: qty 3

## 2021-04-26 MED ORDER — KETOROLAC TROMETHAMINE 60 MG/2ML IM SOLN
60.0000 mg | Freq: Once | INTRAMUSCULAR | Status: AC
  Administered 2021-04-26: 60 mg via INTRAMUSCULAR

## 2021-04-26 MED ORDER — ACETAMINOPHEN 500 MG PO TABS: 1000.0000 mg | ORAL_TABLET | Freq: Three times a day (TID) | ORAL | 0 refills | Status: AC

## 2021-04-26 MED ORDER — DICLOFENAC SODIUM 1 % EX GEL
4.0000 g | Freq: Four times a day (QID) | CUTANEOUS | 2 refills | Status: DC
Start: 2021-04-26 — End: 2022-05-11

## 2021-04-26 MED ORDER — KETOROLAC TROMETHAMINE 60 MG/2ML IM SOLN
INTRAMUSCULAR | Status: AC
  Filled 2021-04-26: qty 2

## 2021-04-26 NOTE — ED Triage Notes (Signed)
Pt reports having back issues for while but pain is worse since Thursday.   ?

## 2021-04-26 NOTE — ED Provider Notes (Signed)
?[facility] ? ? ? ?CSN: [id number] ?Arrival date & time: 04/26/21  1337 ?  ? ?HISTORY  ? ?Chief Complaint  ?Patient presents with  ? Back Pain  ? ?HPI ?Joshua Thomas is a 46 y.o. male. Pt reports having back issues for while but pain is worse since Thursday.  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  Patient states he had an MRI of his lumbar spine in the past, knows that he has degenerative disease.  Patient is requesting pain relief at this time.  Patient denies loss of bowel, bladder function, recent fall, loss of motor coordination in his lower limbs.  Patient states his pain largely radiates down his right leg.  Patient demonstrates difficulty finding a comfortable position to remain in during her visit today.  Patient is alternating leaning over exam table resting on his elbows and standing, shifting his weight from left to right. ? ?The history is provided by the patient.  ?Past Medical History:  ?Diagnosis Date  ? ADHD (attention deficit hyperactivity disorder)   ? Bipolar 1 disorder (HCC)   ? Chronic back pain   ? Chronic pain   ? chronic l leg pain  ? 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 COPD (chronic obstructive pulmonary disease) (HCC)   ? DDD (degenerative disc disease), lumbar   ? Degenerative disc disease, lumbar   ? Depression   ? Discitis   ? GERD (gastroesophageal reflux disease)   ? Hepatitis C   ? Hepatitis C   ? History of stomach ulcers   ? Hx of diabetes mellitus   ? due to infection  ? Hypertension   ? Lung nodules   ? 3 on L and 2 on R  ? PTSD (post-traumatic stress disorder)   ? Sciatica   ? Substance abuse (HCC)   ? ?Patient Active Problem List  ? Diagnosis Date Noted  ? Opioid use disorder, severe, dependence (HCC) 12/15/2020  ? Hepatitis C 12/15/2020  ? MDD (major depressive disorder), recurrent severe, 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  ? 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  ? Essential hypertension 09/23/2017  ? Hyperglycemia 06/03/2017  ? GERD (gastroesophageal reflux disease) 05/17/2017  ? Bipolar disorder (HCC) 05/17/2017  ? ?Past Surgical History:  ?Procedure Laterality Date  ? FRACTURE SURGERY    ? HARDWARE REMOVAL  01/27/2012  ? Procedure: HARDWARE REMOVAL;  Surgeon: Kathryne Hitch, MD;  [facility];  Service: Orthopedics;  Laterality: Left;  ? IR LUMBAR [place]C ASPIRATION W/IMG GUIDE  06/08/2017  ? RADIOLOGY WITH ANESTHESIA N/A 06/08/2017  ? Procedure: DISC ASPIRATION;  Surgeon: Julieanne Cotton, MD;  [facility];  Service: Radiology;  Laterality: N/A;  ? TIBIA IM NAIL INSERTION  01/27/2012  ? Procedure: INTRAMEDULLARY (IM) NAIL TIBIAL;  Surgeon: Kathryne Hitch, MD;  Location: [facility];  Service: Orthopedics;  Laterality: Left;  Removal of IM Rod and Screws Left Tibia with Exchange Nail, Allograft Bone Graft Left Tibia  ? ? ?Home Medications   ? ?Prior to Admission medications   ?Medication Sig Start Date End Date Taking? Authorizing Provider  ?acetaminophen (TYLENOL) 500 MG tablet Take 2 tablets (1,000 mg total) by mouth every 8 (eight) hours. 04/26/21 05/26/21 Yes Theadora Rama Scales, PA-C  ?baclofen (LIORESAL) 10 MG tablet Take 1 tablet (10 mg total) by mouth 3 (three) times daily for 7 days. 04/26/21 05/03/21 Yes Theadora Rama  Scales, PA-C  ?diclofenac Sodium (VOLTAREN) 1 % GEL Apply 4 g topically 4 (four) times daily. Apply to affected areas 4 times daily as needed for pain. 04/26/21  Yes Theadora RamaMorgan, Nora Sabey Scales, PA-C  ?methylPREDNISolone (MEDROL) 4 MG tablet Take 4 tablets (16 mg total) by mouth 2 (two) times daily with a meal for 3 days, THEN 3 tablets (12 mg total) 2 (two) times daily with a meal for 3 days, THEN 2 tablets (8 mg total) 2 (two) times daily with a meal for 3 days, THEN 1 tablet (4 mg total) 2 (two) times daily with a meal for 3 days. 04/26/21 05/08/21 Yes Theadora RamaMorgan, Ray Gervasi Scales, PA-C  ?ibuprofen (ADVIL) 600 MG tablet Take 1  tablet (600 mg total) by mouth every 6 (six) hours as needed. 10/30/20   Burgess AmorIdol, Julie, PA-C  ?naloxone [facility](NARCAN) nasal spray 4 mg/0.1 mL Take in case of overdose 08/07/20   Long, Arlyss RepressJoshua G, MD  ? ? ?Family History ?Family History  ?Problem Relation Age of Onset  ? Diabetes Mother   ? Alcohol abuse Father   ? Cirrhosis Father   ? Aneurysm Father   ? ?Social History ?Social History  ? ?Tobacco Use  ? Smoking status: Every Day  ?  Packs/day: 1.00  ?  Years: 33.00  ?  Pack years: 33.00  ?  Types: Cigarettes  ? Smokeless tobacco: Former  ?  Quit date: 08/08/2006  ? Tobacco comments:  ?  1 PPD  ?Substance Use Topics  ? Alcohol use: Not Currently  ?  Alcohol/week: 1.0 standard drink  ?  Types: 1 Standard drinks or equivalent per week  ?  Comment: occasionally  ? Drug use: Yes  ?  Types: IV, Fentanyl, Benzodiazepines  ?  Comment: uses fentanyl every day  ? ?Allergies   ?Patient has no known allergies. ? ?Review of Systems ?Review of Systems ?Pertinent findings noted in history of present illness.  ? ?Physical Exam ?Triage Vital Signs ?ED Triage Vitals  ?Enc Vitals Group  ?   BP 11/07/20 0827 (!) 147/82  ?   Pulse Rate 11/07/20 0827 72  ?   Resp 11/07/20 0827 18  ?   Temp 11/07/20 0827 98.3 ?F (36.8 ?C)  ?   Temp Source 11/07/20 0827 Oral  ?   SpO2 11/07/20 0827 98 %  ?   Weight --   ?   Height --   ?   Head Circumference --   ?   Peak Flow --   ?   Pain Score 11/07/20 0826 5  ?   Pain Loc --   ?   Pain Edu? --   ?   Excl. in GC? --   ?No data found. ? ?Updated Vital Signs ?BP (!) 144/101   Pulse 80   Temp 98.9 ?F (37.2 ?C) (Oral)   Resp 18   SpO2 99%  ? ?Physical Exam ?Vitals and nursing note reviewed.  ?Constitutional:   ?   General: He is not in acute distress. ?   Appearance: Normal appearance. He is not ill-appearing.  ?HENT:  ?   Head: Normocephalic and atraumatic.  ?Eyes:  ?   General: Lids are normal.     ?   Right eye: No discharge.     ?   Left eye: No discharge.  ?   Extraocular Movements: Extraocular movements  intact.  ?   Conjunctiva/sclera: Conjunctivae normal.  ?   Right eye: Right conjunctiva is not injected.  ?  Left eye: Left conjunctiva is not injected.  ?Neck:  ?   Trachea: Trachea and phonation normal.  ?Cardiovascular:  ?   Rate and Rhythm: Normal rate and regular rhythm.  ?   Pulses: Normal pulses.  ?   Heart sounds: Normal heart sounds. No murmur heard. ?  No friction rub. No gallop.  ?Pulmonary:  ?   Effort: Pulmonary effort is normal. No accessory muscle usage, prolonged expiration or respiratory distress.  ?   Breath sounds: Normal breath sounds. No stridor, decreased air movement or transmitted upper airway sounds. No decreased breath sounds, wheezing, rhonchi or rales.  ?Chest:  ?   Chest wall: No tenderness.  ?Musculoskeletal:  ?   Cervical back: Normal range of motion and neck supple. Normal range of motion.  ?   Lumbar back: Spasms, tenderness and bony tenderness present. No swelling, edema, deformity, signs of trauma or lacerations. Decreased range of motion. Positive right straight leg raise test and positive left straight leg raise test. No scoliosis.  ?Lymphadenopathy:  ?   Cervical: No cervical adenopathy.  ?Skin: ?   General: Skin is warm and dry.  ?   Findings: No erythema or rash.  ?Neurological:  ?   General: No focal deficit present.  ?   Mental Status: He is alert and oriented to person, place, and time.  ?Psychiatric:     ?   Mood and Affect: Mood normal.     ?   Behavior: Behavior normal.  ? ? ?Visual Acuity ?Right Eye Distance:   ?Left Eye Distance:   ?Bilateral Distance:   ? ?Right Eye Near:   ?Left Eye Near:    ?Bilateral Near:    ? ?UC Couse / Diagnostics / Procedures:  ?  ?EKG ? ?Radiology ?No results found. ? ?Procedures ?Procedures (including critical care time) ? ?UC Diagnoses / Final Clinical Impressions(s)   ?I have reviewed the triage vital signs and the nursing notes. ? ?Pertinent labs & imaging results that were available during my care of the patient were reviewed by me and  considered in my medical decision making (see chart for details).   ? ?Final diagnoses:  ?Degenerative disc disease, lumbar  ?Chronic bilateral low back pain with bilateral sciatica  ? ?Patient was provided with a [employer]

## 2021-04-26 NOTE — Discharge Instructions (Addendum)
The mainstay of therapy for musculoskeletal pain is reduction of inflammation and relaxation of tension which is causing inflammation.  Keep in mind, pain always begets more pain.  To help you stay ahead of your pain and inflammation, I have provided the following regimen for you: ?  ?During your visit today, you received an injection of ketorolac, high-dose nonsteroidal anti-inflammatory pain medication that should significantly reduce your pain for the next 6 to 8 hours. ?   ?You were also provided with 975 mg of Tylenol during your visit today.  Please continue taking Tylenol 1000 mg every 8 hours. ?Please begin taking Tylenol 1000 mg 3 times daily (every 8 hours) as soon as you pick up your prescriptions from the pharmacy. ?  ?This evening, you can begin taking baclofen 10 mg.  This is a highly effective muscle relaxer and antispasmodic which should continue to provide you with relaxation of your tense muscles, allow you to sleep well and to keep your pain under control.  You can continue taking this medication 3 times daily as you need to.  If you find that this medication makes you too sleepy, you can break them in half for your daytime doses and, if needed double them for your nighttime dose.  Do not take more than 30 mg of baclofen in a 24-hour period. ?  ?This evening, please begin taking methylprednisolone.  Please take all tablets of the twice daily recommended dose with a meal.   ?  ?During the day, please set aside time to apply ice to the affected area 4 times daily for 20 minutes each application.  This can be achieved by using a bag of frozen peas or corn, a Ziploc bag filled with ice and water, or Ziploc bag filled with half rubbing alcohol and half Dawn dish detergent, frozen into a slush.  Please be careful not to apply ice directly to your skin, always place a soft cloth between you and the ice pack. ?  ?You are welcome to use topical anti-inflammatory creams such as Voltaren gel, capsaicin or  Aspercreme as recommended.  These medications are available over-the-counter, please follow manufactures instructions for use.  As a courtesy, I provided you with a prescription for diclofenac in the event that your insurance will pay for this. ?  ?Please avoid attempts to stretch or strengthen the affected area until you are feeling completely pain-free.  Attempts to do so will only prolong the healing process. ?  ?Thank you for visiting urgent care today.  We appreciate the opportunity to participate in your care. ? ?

## 2021-05-26 DIAGNOSIS — M5416 Radiculopathy, lumbar region: Secondary | ICD-10-CM | POA: Diagnosis not present

## 2021-05-26 DIAGNOSIS — M5442 Lumbago with sciatica, left side: Secondary | ICD-10-CM | POA: Diagnosis not present

## 2021-05-26 DIAGNOSIS — M791 Myalgia, unspecified site: Secondary | ICD-10-CM | POA: Diagnosis not present

## 2021-06-10 ENCOUNTER — Emergency Department (HOSPITAL_COMMUNITY)
Admission: EM | Admit: 2021-06-10 | Discharge: 2021-06-10 | Disposition: A | Discharge status: Home / Self Care | Payer: 59 | Attending: Emergency Medicine | Admitting: Emergency Medicine

## 2021-06-10 ENCOUNTER — Emergency Department (HOSPITAL_COMMUNITY): Payer: 59

## 2021-06-10 ENCOUNTER — Encounter (HOSPITAL_COMMUNITY): Payer: Self-pay | Admitting: Emergency Medicine

## 2021-06-10 ENCOUNTER — Other Ambulatory Visit: Payer: Self-pay

## 2021-06-10 DIAGNOSIS — R911 Solitary pulmonary nodule: Secondary | ICD-10-CM | POA: Diagnosis not present

## 2021-06-10 DIAGNOSIS — M549 Dorsalgia, unspecified: Secondary | ICD-10-CM | POA: Diagnosis not present

## 2021-06-10 DIAGNOSIS — M5442 Lumbago with sciatica, left side: Secondary | ICD-10-CM

## 2021-06-10 DIAGNOSIS — M79605 Pain in left leg: Secondary | ICD-10-CM | POA: Diagnosis not present

## 2021-06-10 DIAGNOSIS — M25562 Pain in left knee: Secondary | ICD-10-CM | POA: Diagnosis not present

## 2021-06-10 DIAGNOSIS — M545 Low back pain, unspecified: Secondary | ICD-10-CM | POA: Diagnosis not present

## 2021-06-10 DIAGNOSIS — Z9889 Other specified postprocedural states: Secondary | ICD-10-CM | POA: Diagnosis not present

## 2021-06-10 LAB — CBC WITH DIFFERENTIAL/PLATELET
Abs Immature Granulocytes: 0.03 10*3/uL (ref 0.00–0.07)
Basophils Absolute: 0 10*3/uL (ref 0.0–0.1)
Basophils Relative: 0 %
Eosinophils Absolute: 0 10*3/uL (ref 0.0–0.5)
Eosinophils Relative: 1 %
HCT: 46.1 % (ref 39.0–52.0)
Hemoglobin: 14.6 g/dL (ref 13.0–17.0)
Immature Granulocytes: 0 %
Lymphocytes Relative: 15 %
Lymphs Abs: 1.1 10*3/uL (ref 0.7–4.0)
MCH: 29.9 pg (ref 26.0–34.0)
MCHC: 31.7 g/dL (ref 30.0–36.0)
MCV: 94.3 fL (ref 80.0–100.0)
Monocytes Absolute: 0.4 10*3/uL (ref 0.1–1.0)
Monocytes Relative: 5 %
Neutro Abs: 5.6 10*3/uL (ref 1.7–7.7)
Neutrophils Relative %: 79 %
Platelets: 171 10*3/uL (ref 150–400)
RBC: 4.89 MIL/uL (ref 4.22–5.81)
RDW: 13.5 % (ref 11.5–15.5)
WBC: 7.1 10*3/uL (ref 4.0–10.5)
nRBC: 0 % (ref 0.0–0.2)

## 2021-06-10 MED ORDER — CYCLOBENZAPRINE HCL 10 MG PO TABS: 10.0000 mg | ORAL_TABLET | Freq: Two times a day (BID) | ORAL | 0 refills | Status: DC | PRN

## 2021-06-10 MED ORDER — HYDROMORPHONE HCL 1 MG/ML IJ SOLN
1.0000 mg | Freq: Once | INTRAMUSCULAR | Status: AC
  Administered 2021-06-10: 1 mg via INTRAMUSCULAR
  Filled 2021-06-10: qty 1

## 2021-06-10 MED ORDER — ETODOLAC 300 MG PO CAPS: 300.0000 mg | ORAL_CAPSULE | Freq: Three times a day (TID) | ORAL | 0 refills | Status: DC
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
## 2021-06-10 NOTE — Discharge Instructions (Signed)
Take the medications as needed for pain and discomfort.  Follow-up with a spine doctor for further evaluation.  Return to the ED for fevers chills worsening symptoms

## 2021-06-10 NOTE — ED Notes (Signed)
Report given to Kayla RN
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
## 2021-06-10 NOTE — ED Notes (Signed)
Pt taken to xray 
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
## 2021-06-10 NOTE — ED Notes (Signed)
Ambulatory to restroom unassisted.  

## 2021-06-12 ENCOUNTER — Other Ambulatory Visit: Payer: Self-pay

## 2021-06-12 ENCOUNTER — Emergency Department (HOSPITAL_COMMUNITY)
Admission: EM | Admit: 2021-06-12 | Discharge: 2021-06-12 | Disposition: A | Discharge status: Home / Self Care | Payer: 59 | Attending: Emergency Medicine | Admitting: Emergency Medicine

## 2021-06-12 ENCOUNTER — Encounter (HOSPITAL_COMMUNITY): Payer: Self-pay

## 2021-06-12 DIAGNOSIS — S80812A Abrasion, left lower leg, initial encounter: Secondary | ICD-10-CM | POA: Diagnosis not present

## 2021-06-12 DIAGNOSIS — F430 Acute stress reaction: Secondary | ICD-10-CM

## 2021-06-12 DIAGNOSIS — Y92410 Unspecified street and highway as the place of occurrence of the external cause: Secondary | ICD-10-CM | POA: Insufficient documentation

## 2021-06-12 DIAGNOSIS — M79605 Pain in left leg: Secondary | ICD-10-CM | POA: Diagnosis not present

## 2021-06-12 DIAGNOSIS — S80811A Abrasion, right lower leg, initial encounter: Secondary | ICD-10-CM | POA: Diagnosis not present

## 2021-06-12 DIAGNOSIS — F1994 Other psychoactive substance use, unspecified with psychoactive substance-induced mood disorder: Secondary | ICD-10-CM

## 2021-06-12 DIAGNOSIS — S32000S Wedge compression fracture of unspecified lumbar vertebra, sequela: Secondary | ICD-10-CM

## 2021-06-12 DIAGNOSIS — R69 Illness, unspecified: Secondary | ICD-10-CM | POA: Diagnosis not present

## 2021-06-12 DIAGNOSIS — F1990 Other psychoactive substance use, unspecified, uncomplicated: Secondary | ICD-10-CM

## 2021-06-12 DIAGNOSIS — M25462 Effusion, left knee: Secondary | ICD-10-CM

## 2021-06-12 DIAGNOSIS — M25562 Pain in left knee: Secondary | ICD-10-CM | POA: Diagnosis not present

## 2021-06-12 DIAGNOSIS — Z20822 Contact with and (suspected) exposure to covid-19: Secondary | ICD-10-CM | POA: Insufficient documentation

## 2021-06-12 DIAGNOSIS — Z743 Need for continuous supervision: Secondary | ICD-10-CM | POA: Diagnosis not present

## 2021-06-12 DIAGNOSIS — Y900 Blood alcohol level of less than 20 mg/100 ml: Secondary | ICD-10-CM | POA: Insufficient documentation

## 2021-06-12 DIAGNOSIS — G8929 Other chronic pain: Secondary | ICD-10-CM

## 2021-06-12 DIAGNOSIS — M545 Low back pain, unspecified: Secondary | ICD-10-CM | POA: Diagnosis not present

## 2021-06-12 LAB — CBC
HCT: 37.5 % — ABNORMAL LOW (ref 39.0–52.0)
Hemoglobin: 12.1 g/dL — ABNORMAL LOW (ref 13.0–17.0)
MCH: 30.4 pg (ref 26.0–34.0)
MCHC: 32.3 g/dL (ref 30.0–36.0)
MCV: 94.2 fL (ref 80.0–100.0)
Platelets: 172 10*3/uL (ref 150–400)
RBC: 3.98 MIL/uL — ABNORMAL LOW (ref 4.22–5.81)
RDW: 13.4 % (ref 11.5–15.5)
WBC: 8.8 10*3/uL (ref 4.0–10.5)
nRBC: 0 % (ref 0.0–0.2)

## 2021-06-12 LAB — BASIC METABOLIC PANEL
Anion gap: 9 (ref 5–15)
BUN: 18 mg/dL (ref 6–20)
CO2: 25 mmol/L (ref 22–32)
Calcium: 9 mg/dL (ref 8.9–10.3)
Chloride: 98 mmol/L (ref 98–111)
Creatinine, Ser: 1.1 mg/dL (ref 0.61–1.24)
GFR, Estimated: >60 mL/min (ref >60)
Glucose, Bld: 145 mg/dL — ABNORMAL HIGH (ref 70–99)
Potassium: 3.4 mmol/L — ABNORMAL LOW (ref 3.5–5.1)
Sodium: 132 mmol/L — ABNORMAL LOW (ref 135–145)

## 2021-06-12 LAB — RESP PANEL BY RT-PCR (FLU A&B, COVID) ARPGX2
Influenza A by PCR: NEGATIVE
Influenza B by PCR: NEGATIVE
SARS Coronavirus 2 by RT PCR: NEGATIVE

## 2021-06-12 LAB — RAPID URINE DRUG SCREEN, HOSP PERFORMED
Amphetamines: NOT DETECTED
Barbiturates: NOT DETECTED
Benzodiazepines: NOT DETECTED
Cocaine: NOT DETECTED
Opiates: NOT DETECTED
Tetrahydrocannabinol: NOT DETECTED

## 2021-06-12 LAB — ETHANOL: Alcohol, Ethyl (B): <10 mg/dL (ref <10)

## 2021-06-12 MED ORDER — OXYCODONE-ACETAMINOPHEN 5-325 MG PO TABS
1.0000 | ORAL_TABLET | Freq: Once | ORAL | Status: AC
  Administered 2021-06-12: 1 via ORAL
  Filled 2021-06-12: qty 1

## 2021-06-12 MED ORDER — METHOCARBAMOL 500 MG PO TABS
750.0000 mg | ORAL_TABLET | Freq: Once | ORAL | Status: AC
  Administered 2021-06-12: 750 mg via ORAL
  Filled 2021-06-12: qty 2

## 2021-06-12 MED ORDER — IBUPROFEN 400 MG PO TABS
600.0000 mg | ORAL_TABLET | Freq: Three times a day (TID) | ORAL | Status: DC
  Administered 2021-06-12 (×2): 600 mg via ORAL
  Filled 2021-06-12 (×2): qty 1

## 2021-06-12 MED ORDER — POTASSIUM CHLORIDE CRYS ER 20 MEQ PO TBCR
40.0000 meq | EXTENDED_RELEASE_TABLET | Freq: Once | ORAL | Status: AC
  Administered 2021-06-12: 40 meq via ORAL
  Filled 2021-06-12: qty 2

## 2021-06-12 MED ORDER — OXYCODONE-ACETAMINOPHEN 5-325 MG PO TABS: 1.0000 | ORAL_TABLET | Freq: Four times a day (QID) | ORAL | 0 refills | Status: DC | PRN

## 2021-06-12 MED ORDER — OXYCODONE-ACETAMINOPHEN 5-325 MG PO TABS
2.0000 | ORAL_TABLET | Freq: Once | ORAL | Status: AC
  Administered 2021-06-12: 2 via ORAL
  Filled 2021-06-12: qty 2

## 2021-06-12 NOTE — ED Notes (Signed)
Patient wanting to talk to doctor. Patient states "if yall discharge me from here without a place to go I will just use on the streets and die" Offered patient listed resources and [facility]  number to try to call himself for placement since he is aware of his substance abuse issues.

## 2021-06-12 NOTE — ED Notes (Addendum)
Pt LLE is red/swollen and warm to touch. Pulses present
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
## 2021-06-12 NOTE — ED Notes (Signed)
Patient given his discharge packet with information on [facility] and [facility] with Gaylan Gerold: Executive Director [phone] that may can pick patient up to receive help. Gave patient phone t make these phone calls to see if he can set up own plan of care post discharge. Patient agreeable to this plan at this time

## 2021-06-12 NOTE — ED Notes (Signed)
Social work recommends for patient to follow up on own terms given the resources provided today. Patient continues to want to talk to MD prior to discharge. This nurse has had this discussion face to face with MD who has not yet been in to meet with patient.

## 2021-06-12 NOTE — Discharge Instructions (Addendum)
It was our pleasure to provide your ER care today - we hope that you feel better.Please continue ibuprofen 600 mg up to 3 times a day -  take with food. You may also try acetaminophen as need for pain. You may take flexeril as need for muscle pain/spasm (@ [facility]) - no driving when taking. We also sent in a couple day pain med prescription - take as directed, no driving when taking.   Do not bear weight except as needed to go to the restroom and use your crutches as need. Use Ace wrap for mild compression, make sure that it is loose enough that there is no pain in the foot or lower part of the leg. Use cold therapy on type of the Ace wrap.  A question of a pulmonary nodule seen on the CT last week. Please have follow-up regarding this with your primary care physician in the next 1-2 months.   Also see resource guides attached - we recommend you follow up closely with behavioral health provider in the coming week, as well as substance use disorder treatment program.   Return to ER if worse, new symptoms, fevers, numbness or weakness, problems with normal bowel/bladder function, or other concern.

## 2021-06-12 NOTE — ED Notes (Signed)
Patient requesting to talk to MD prior to leaving. MD notified

## 2021-06-12 NOTE — ED Provider Notes (Addendum)
Emergency Medicine Observation Re-evaluation Note  Joshua Thomas is a 46 y.o. male, seen on rounds today.  Pt initially presented to the ED for complaints of Leg Pain Currently pt resting comfortably. Easily aroused - exhibits normal mood/affect, conversant. When moves around on stretcher notes lower back pain/spasm - notes hx chronic back pain - dose of robaxin provided.   Physical Exam  BP 99/60 (BP Location: Right Arm)   Pulse 67   Temp 97.6 F (36.4 C) (Oral)   Resp 20   Ht 1.651 m (5\' 5" )   Wt 72.6 kg   SpO2 100%   BMI 26.63 kg/m  Physical Exam General: resting, easily aroused - conversant.  Cardiac: regular rate Lungs: breathing comfortably. Psych: pt exhibits normal mood and affect - pt is pleasant and conversant - he does not appear to be acutely depressed or despondent. No SI/HI. Pt voices no thoughts of harm to self or others. Pt is not responding to internal stimuli - no delusions or hallucinations noted.   ED Course / MDM    I have reviewed the labs performed to date as well as medications administered while in observation.  Recent changes in the last 24 hours include ED obs, reassessment.   Plan    Joshua Thomas is not under involuntary commitment.  Currently, pt with no SI/HI, plan or desire to harm self or others, and there is  no indication of acute psychosis.  Pt does acknowledge stressors related to chronic pain syndrome, and substance use disorder. He denies etoh use problems or chronic alcoholism.   Prior imaging noted 'chronic compression fxs' but no acute fx. When moves about/changes position does note low back pain, not radicular. TLS spine non tender. +lumbar muscular  tenderness. No numbness/weakness. No problems with bowel/bladder control.   Robaxin po.  Pt indicates lives w parent in [place]. Indicates although relationship is strained at times, he feels that remains viable living situation, and indicates is plan is to return there when discharged.    Pt currently appears stable for d/c.   Will provide outpatient resources re close outpt bh and pcp f/u, as well as provided resources for substance use treatment programs.   Return precautions provided.      Garrison, MD 06/12/21 1639  At d/c pt requests small quantity pain meds until can facilitate f/u.  Small quantity pain rx provided. Pt is encouraged  to f/u pcp, bh and treatment program - pt indicates he does plan on pursuing tx program tomorrow AM.       08/12/21, MD 06/12/21 1905
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
## 2021-06-12 NOTE — ED Notes (Signed)
Pt ambulatory to bathroom with one crutch.

## 2021-06-12 NOTE — ED Notes (Addendum)
Pt told staff that he wanted to speak to the doctor about "something personal" and says that if he goes home he is going to go to "the streets and use dope to hurt himself". He states that he has had problems with his mother and recently relapsed with drugs and last used 06/10/21

## 2021-06-12 NOTE — ED Notes (Signed)
MD notified that patient does not want to leave without talking to him first

## 2021-06-12 NOTE — ED Notes (Signed)
Reached out to social work to review patient options at this time. Awaiting a response

## 2021-07-03 ENCOUNTER — Inpatient Hospital Stay (HOSPITAL_COMMUNITY)
Admission: EM | Admit: 2021-07-03 | Discharge: 2021-07-08 | DRG: 540 | Disposition: A | Discharge status: Home / Self Care | Payer: 59 | Attending: Internal Medicine | Admitting: Internal Medicine

## 2021-07-03 ENCOUNTER — Other Ambulatory Visit: Payer: Self-pay

## 2021-07-03 ENCOUNTER — Encounter (HOSPITAL_COMMUNITY): Payer: Self-pay | Admitting: Emergency Medicine

## 2021-07-03 DIAGNOSIS — Z8711 Personal history of peptic ulcer disease: Secondary | ICD-10-CM

## 2021-07-03 DIAGNOSIS — M5442 Lumbago with sciatica, left side: Secondary | ICD-10-CM | POA: Diagnosis not present

## 2021-07-03 DIAGNOSIS — M4627 Osteomyelitis of vertebra, lumbosacral region: Principal | ICD-10-CM | POA: Diagnosis present

## 2021-07-03 DIAGNOSIS — F1190 Opioid use, unspecified, uncomplicated: Secondary | ICD-10-CM

## 2021-07-03 DIAGNOSIS — R001 Bradycardia, unspecified: Secondary | ICD-10-CM | POA: Diagnosis not present

## 2021-07-03 DIAGNOSIS — Z833 Family history of diabetes mellitus: Secondary | ICD-10-CM

## 2021-07-03 DIAGNOSIS — F319 Bipolar disorder, unspecified: Secondary | ICD-10-CM | POA: Diagnosis present

## 2021-07-03 DIAGNOSIS — M5432 Sciatica, left side: Secondary | ICD-10-CM | POA: Diagnosis present

## 2021-07-03 DIAGNOSIS — Z885 Allergy status to narcotic agent status: Secondary | ICD-10-CM

## 2021-07-03 DIAGNOSIS — F1490 Cocaine use, unspecified, uncomplicated: Secondary | ICD-10-CM | POA: Diagnosis present

## 2021-07-03 DIAGNOSIS — F17210 Nicotine dependence, cigarettes, uncomplicated: Secondary | ICD-10-CM | POA: Diagnosis present

## 2021-07-03 DIAGNOSIS — M4626 Osteomyelitis of vertebra, lumbar region: Secondary | ICD-10-CM | POA: Diagnosis not present

## 2021-07-03 DIAGNOSIS — M5136 Other intervertebral disc degeneration, lumbar region: Secondary | ICD-10-CM | POA: Diagnosis present

## 2021-07-03 DIAGNOSIS — F1110 Opioid abuse, uncomplicated: Secondary | ICD-10-CM | POA: Diagnosis present

## 2021-07-03 DIAGNOSIS — G8929 Other chronic pain: Secondary | ICD-10-CM | POA: Diagnosis present

## 2021-07-03 DIAGNOSIS — N179 Acute kidney failure, unspecified: Secondary | ICD-10-CM | POA: Diagnosis present

## 2021-07-03 DIAGNOSIS — M8610 Other acute osteomyelitis, unspecified site: Secondary | ICD-10-CM | POA: Diagnosis present

## 2021-07-03 LAB — CBC WITH DIFFERENTIAL/PLATELET
Abs Immature Granulocytes: 0.02 10*3/uL (ref 0.00–0.07)
Basophils Absolute: 0 10*3/uL (ref 0.0–0.1)
Basophils Relative: 0 %
Eosinophils Absolute: 0 10*3/uL (ref 0.0–0.5)
Eosinophils Relative: 1 %
HCT: 38.5 % — ABNORMAL LOW (ref 39.0–52.0)
Hemoglobin: 12.5 g/dL — ABNORMAL LOW (ref 13.0–17.0)
Immature Granulocytes: 0 %
Lymphocytes Relative: 16 %
Lymphs Abs: 0.9 10*3/uL (ref 0.7–4.0)
MCH: 29.9 pg (ref 26.0–34.0)
MCHC: 32.5 g/dL (ref 30.0–36.0)
MCV: 92.1 fL (ref 80.0–100.0)
Monocytes Absolute: 0.5 10*3/uL (ref 0.1–1.0)
Monocytes Relative: 9 %
Neutro Abs: 4.2 10*3/uL (ref 1.7–7.7)
Neutrophils Relative %: 74 %
Platelets: 161 10*3/uL (ref 150–400)
RBC: 4.18 MIL/uL — ABNORMAL LOW (ref 4.22–5.81)
RDW: 13.7 % (ref 11.5–15.5)
WBC: 5.6 10*3/uL (ref 4.0–10.5)
nRBC: 0 % (ref 0.0–0.2)

## 2021-07-03 MED ORDER — OXYCODONE-ACETAMINOPHEN 5-325 MG PO TABS
1.0000 | ORAL_TABLET | Freq: Once | ORAL | Status: AC
  Administered 2021-07-03: 1 via ORAL
  Filled 2021-07-03: qty 1

## 2021-07-04 ENCOUNTER — Emergency Department (HOSPITAL_COMMUNITY): Payer: 59

## 2021-07-04 ENCOUNTER — Encounter (HOSPITAL_COMMUNITY): Payer: Self-pay | Admitting: Internal Medicine

## 2021-07-04 DIAGNOSIS — R69 Illness, unspecified: Secondary | ICD-10-CM | POA: Diagnosis not present

## 2021-07-04 DIAGNOSIS — M8610 Other acute osteomyelitis, unspecified site: Secondary | ICD-10-CM | POA: Diagnosis present

## 2021-07-04 DIAGNOSIS — F1910 Other psychoactive substance abuse, uncomplicated: Secondary | ICD-10-CM | POA: Diagnosis not present

## 2021-07-04 DIAGNOSIS — N179 Acute kidney failure, unspecified: Secondary | ICD-10-CM | POA: Diagnosis not present

## 2021-07-04 DIAGNOSIS — M5126 Other intervertebral disc displacement, lumbar region: Secondary | ICD-10-CM | POA: Diagnosis not present

## 2021-07-04 DIAGNOSIS — M4627 Osteomyelitis of vertebra, lumbosacral region: Secondary | ICD-10-CM | POA: Diagnosis not present

## 2021-07-04 DIAGNOSIS — M5136 Other intervertebral disc degeneration, lumbar region: Secondary | ICD-10-CM | POA: Diagnosis not present

## 2021-07-04 DIAGNOSIS — M5442 Lumbago with sciatica, left side: Secondary | ICD-10-CM | POA: Diagnosis not present

## 2021-07-04 DIAGNOSIS — Z833 Family history of diabetes mellitus: Secondary | ICD-10-CM | POA: Diagnosis not present

## 2021-07-04 DIAGNOSIS — M4647 Discitis, unspecified, lumbosacral region: Secondary | ICD-10-CM

## 2021-07-04 DIAGNOSIS — F1190 Opioid use, unspecified, uncomplicated: Secondary | ICD-10-CM | POA: Diagnosis not present

## 2021-07-04 DIAGNOSIS — M4644 Discitis, unspecified, thoracic region: Secondary | ICD-10-CM | POA: Diagnosis not present

## 2021-07-04 DIAGNOSIS — F17210 Nicotine dependence, cigarettes, uncomplicated: Secondary | ICD-10-CM | POA: Diagnosis present

## 2021-07-04 DIAGNOSIS — F1490 Cocaine use, unspecified, uncomplicated: Secondary | ICD-10-CM | POA: Diagnosis present

## 2021-07-04 DIAGNOSIS — F319 Bipolar disorder, unspecified: Secondary | ICD-10-CM | POA: Diagnosis present

## 2021-07-04 DIAGNOSIS — M47816 Spondylosis without myelopathy or radiculopathy, lumbar region: Secondary | ICD-10-CM | POA: Diagnosis not present

## 2021-07-04 DIAGNOSIS — M4626 Osteomyelitis of vertebra, lumbar region: Secondary | ICD-10-CM | POA: Diagnosis not present

## 2021-07-04 DIAGNOSIS — M4624 Osteomyelitis of vertebra, thoracic region: Secondary | ICD-10-CM | POA: Diagnosis not present

## 2021-07-04 DIAGNOSIS — F1110 Opioid abuse, uncomplicated: Secondary | ICD-10-CM | POA: Diagnosis present

## 2021-07-04 DIAGNOSIS — M4646 Discitis, unspecified, lumbar region: Secondary | ICD-10-CM | POA: Diagnosis not present

## 2021-07-04 DIAGNOSIS — M4649 Discitis, unspecified, multiple sites in spine: Secondary | ICD-10-CM | POA: Diagnosis not present

## 2021-07-04 DIAGNOSIS — Z885 Allergy status to narcotic agent status: Secondary | ICD-10-CM | POA: Diagnosis not present

## 2021-07-04 DIAGNOSIS — M5127 Other intervertebral disc displacement, lumbosacral region: Secondary | ICD-10-CM | POA: Diagnosis not present

## 2021-07-04 DIAGNOSIS — Z8711 Personal history of peptic ulcer disease: Secondary | ICD-10-CM | POA: Diagnosis not present

## 2021-07-04 DIAGNOSIS — M5432 Sciatica, left side: Secondary | ICD-10-CM | POA: Diagnosis not present

## 2021-07-04 DIAGNOSIS — G8929 Other chronic pain: Secondary | ICD-10-CM | POA: Diagnosis not present

## 2021-07-04 DIAGNOSIS — R001 Bradycardia, unspecified: Secondary | ICD-10-CM | POA: Diagnosis not present

## 2021-07-04 HISTORY — DX: Other acute osteomyelitis, unspecified site: M86.10

## 2021-07-04 LAB — HEPATITIS B SURFACE ANTIGEN: Hepatitis B Surface Ag: NONREACTIVE

## 2021-07-04 LAB — COMPREHENSIVE METABOLIC PANEL
ALT: 16 U/L (ref 0–44)
AST: 19 U/L (ref 15–41)
Albumin: 3.7 g/dL (ref 3.5–5.0)
Alkaline Phosphatase: 45 U/L (ref 38–126)
Anion gap: 14 (ref 5–15)
BUN: 25 mg/dL — ABNORMAL HIGH (ref 6–20)
CO2: 27 mmol/L (ref 22–32)
Calcium: 9.7 mg/dL (ref 8.9–10.3)
Chloride: 95 mmol/L — ABNORMAL LOW (ref 98–111)
Creatinine, Ser: 1.67 mg/dL — ABNORMAL HIGH (ref 0.61–1.24)
GFR, Estimated: 51 mL/min — ABNORMAL LOW (ref >60)
Glucose, Bld: 187 mg/dL — ABNORMAL HIGH (ref 70–99)
Potassium: 3.8 mmol/L (ref 3.5–5.1)
Sodium: 136 mmol/L (ref 135–145)
Total Bilirubin: 0.8 mg/dL (ref 0.3–1.2)
Total Protein: 7.6 g/dL (ref 6.5–8.1)

## 2021-07-04 LAB — C-REACTIVE PROTEIN: CRP: 6.3 mg/dL — ABNORMAL HIGH (ref <1.0)

## 2021-07-04 MED ORDER — OXYCODONE HCL 5 MG PO TABS
5.0000 mg | ORAL_TABLET | ORAL | Status: DC | PRN
  Administered 2021-07-04: 5 mg via ORAL
  Filled 2021-07-04: qty 1

## 2021-07-04 MED ORDER — METHOCARBAMOL 1000 MG/10ML IJ SOLN
500.0000 mg | Freq: Four times a day (QID) | INTRAMUSCULAR | Status: DC | PRN
  Administered 2021-07-05: 500 mg via INTRAVENOUS
  Filled 2021-07-04: qty 5

## 2021-07-04 MED ORDER — SODIUM CHLORIDE 0.9 % IV BOLUS
1000.0000 mL | Freq: Once | INTRAVENOUS | Status: AC
  Administered 2021-07-04: 1000 mL via INTRAVENOUS

## 2021-07-04 MED ORDER — QUETIAPINE FUMARATE 100 MG PO TABS
100.0000 mg | ORAL_TABLET | Freq: Every day | ORAL | Status: DC
  Administered 2021-07-04 – 2021-07-07 (×4): 100 mg via ORAL
  Filled 2021-07-04 (×4): qty 1

## 2021-07-04 MED ORDER — SERTRALINE HCL 100 MG PO TABS
100.0000 mg | ORAL_TABLET | Freq: Every day | ORAL | Status: DC
  Administered 2021-07-05 – 2021-07-08 (×4): 100 mg via ORAL
  Filled 2021-07-04 (×4): qty 1

## 2021-07-04 MED ORDER — OXYCODONE HCL 5 MG PO TABS
10.0000 mg | ORAL_TABLET | ORAL | Status: DC | PRN
  Administered 2021-07-04 – 2021-07-05 (×2): 10 mg via ORAL
  Filled 2021-07-04 (×3): qty 2

## 2021-07-04 MED ORDER — HYDROMORPHONE HCL 1 MG/ML IJ SOLN
1.0000 mg | INTRAMUSCULAR | Status: DC | PRN
  Administered 2021-07-04 – 2021-07-05 (×5): 1 mg via INTRAVENOUS
  Filled 2021-07-04 (×5): qty 1

## 2021-07-04 MED ORDER — GADOBUTROL 1 MMOL/ML IV SOLN
7.2000 mL | Freq: Once | INTRAVENOUS | Status: AC | PRN
  Administered 2021-07-04: 7.2 mL via INTRAVENOUS

## 2021-07-04 MED ORDER — SENNA 8.6 MG PO TABS
1.0000 | ORAL_TABLET | Freq: Two times a day (BID) | ORAL | Status: DC
  Administered 2021-07-04 – 2021-07-08 (×8): 8.6 mg via ORAL
  Filled 2021-07-04 (×8): qty 1

## 2021-07-04 MED ORDER — ENOXAPARIN SODIUM 40 MG/0.4ML IJ SOSY
40.0000 mg | PREFILLED_SYRINGE | INTRAMUSCULAR | Status: DC
  Administered 2021-07-04 – 2021-07-07 (×3): 40 mg via SUBCUTANEOUS
  Filled 2021-07-04 (×3): qty 0.4

## 2021-07-04 MED ORDER — LORAZEPAM 2 MG/ML IJ SOLN
1.0000 mg | Freq: Once | INTRAMUSCULAR | Status: AC | PRN
  Administered 2021-07-04: 1 mg via INTRAVENOUS
  Filled 2021-07-04: qty 1

## 2021-07-04 MED ORDER — MORPHINE SULFATE (PF) 4 MG/ML IV SOLN
4.0000 mg | Freq: Once | INTRAVENOUS | Status: AC
  Administered 2021-07-04: 4 mg via INTRAVENOUS
  Filled 2021-07-04: qty 1

## 2021-07-04 MED ORDER — POLYETHYLENE GLYCOL 3350 17 G PO PACK
17.0000 g | PACK | Freq: Every day | ORAL | Status: DC | PRN
Start: 2021-07-04 — End: 2021-07-08

## 2021-07-04 NOTE — Hospital Course (Addendum)
6/28: pain better controlled.

## 2021-07-04 NOTE — ED Provider Notes (Signed)
[facility] EMERGENCY DEPARTMENT Provider Note   CSN: [id number] Arrival date & time: 07/03/21  2253     History  Chief Complaint  Patient presents with   Back Pain    Joshua Thomas is a 46 y.o. male.  Patient presents to the hospital complaining of low back pain with radiation of symptoms in the left lower extremity with some left lower extremity paresthesias.  Patient has extensive history of the same but states that has been worsening over the past month.  Today he has subjective fever with sweats and chills.  The patient does have history of IV drug use and initially stated he has not injected in a few months but upon my questioning again stated last injection was closer to 1 week ago.  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  Patient was treated at that time with IV antibiotics followed by Levaquin.  Today patient endorses low back pain with radicular symptoms in the left leg, paresthesias, sweats, chills.  Denies abdominal pain, shortness of breath, nausea, vomiting, chest pain.  Past medical history significant for IV drug use, history of discitis, degenerative disc disease, COPD, history of sciatica, depression, bipolar 1 disorder, substance abuse, hepatitis C  HPI     Home Medications Prior to Admission medications   Medication Sig Start Date End Date Taking? Authorizing Provider  Calcium Carbonate Antacid (TUMS PO) Take 2-3 tablets by mouth as needed (gerd).    [provider]  cyclobenzaprine (FLEXERIL) 10 MG tablet Take 1 tablet (10 mg total) by mouth 2 (two) times daily as needed for muscle spasms. Patient not taking: Reported on 06/12/2021 06/10/21   Linwood Dibbles, MD  diclofenac Sodium (VOLTAREN) 1 % GEL Apply 4 g topically 4 (four) times daily. Apply to affected areas 4 times daily as needed for pain. Patient not taking: Reported on 06/12/2021 04/26/21   Theadora Rama  Scales, PA-C  etodolac (LODINE) 300 MG capsule Take 1 capsule (300 mg total) by mouth every 8 (eight) hours. Patient not taking: Reported on 06/12/2021 06/10/21   Linwood Dibbles, MD  IBUPROFEN PO Take 1 tablet by mouth See admin instructions. Take 1 tablet ever 4-6 hours daily    [provider]  naloxone [facility]) nasal spray 4 mg/0.1 mL Take in case of overdose 08/07/20   Long, Arlyss Repress, MD  oxyCODONE-acetaminophen (PERCOCET/ROXICET) 5-325 MG tablet Take 1-2 tablets by mouth every 6 (six) hours as needed for severe pain. 06/12/21   Cathren Laine, MD  sertraline (ZOLOFT) 100 MG tablet 1 tablet  Once a day  take at 8 AM; Patient not taking: Reported on 06/12/2021 12/16/20   [provider]      Allergies    Patient has no known allergies.    Review of Systems   Review of Systems  Constitutional:  Positive for chills and diaphoresis. Negative for fever.  Respiratory:  Negative for shortness of breath.   Cardiovascular:  Negative for chest pain.  Gastrointestinal:  Negative for abdominal pain, nausea and vomiting.  Genitourinary:  Negative for difficulty urinating.  Musculoskeletal:  Positive for back pain.  Neurological:  Positive for numbness.    Physical Exam Updated Vital Signs BP 134/85   Pulse 84   Temp 98.2 F (36.8 C) (Oral)   Resp 16   SpO2 100%  Physical Exam Vitals and nursing note reviewed.  Constitutional:      General: He  is in acute distress.     Appearance: He is normal weight.  HENT:     Head: Normocephalic and atraumatic.     Mouth/Throat:     Mouth: Mucous membranes are moist.  Eyes:     Conjunctiva/sclera: Conjunctivae normal.  Cardiovascular:     Rate and Rhythm: Normal rate and regular rhythm.     Pulses: Normal pulses.     Heart sounds: Normal heart sounds.  Pulmonary:     Effort: Pulmonary effort is normal.     Breath sounds: Normal breath sounds.  Abdominal:     Palpations: Abdomen is soft.     Tenderness: There is no abdominal  tenderness.  Musculoskeletal:        General: Tenderness (Tenderness to palpation across lower back) present.     Cervical back: Normal range of motion and neck supple.  Skin:    General: Skin is warm and dry.  Neurological:     Mental Status: He is alert.     Motor: No weakness.     Coordination: Coordination normal.     Gait: Gait normal.     Comments: Sensation intact bilateral lower extremities CN II-VI, XI,XII intact     ED Results / Procedures / Treatments   Labs (all labs ordered are listed, but only abnormal results are displayed) Labs Reviewed  COMPREHENSIVE METABOLIC PANEL - Abnormal; Notable for the following components:      Result Value   Chloride 95 (*)    Glucose, Bld 187 (*)    BUN 25 (*)    Creatinine, Ser 1.67 (*)    GFR, Estimated 51 (*)    All other components within normal limits  CBC WITH DIFFERENTIAL/PLATELET - Abnormal; Notable for the following components:   RBC 4.18 (*)    Hemoglobin 12.5 (*)    HCT 38.5 (*)    All other components within normal limits  CULTURE, BLOOD (ROUTINE X 2)  CULTURE, BLOOD (ROUTINE X 2)  HEPATITIS B SURFACE ANTIGEN  SEDIMENTATION RATE  C-REACTIVE PROTEIN    EKG None  Radiology MR Lumbar Spine W Wo Contrast  Result Date: 07/04/2021 CLINICAL DATA:  Low back pain with infection suspected EXAM: MRI LUMBAR SPINE WITHOUT AND WITH CONTRAST TECHNIQUE: Multiplanar and multiecho pulse sequences of the lumbar spine were obtained without and with intravenous contrast. CONTRAST:  7.79mL GADAVIST GADOBUTROL 1 MMOL/ML IV SOLN COMPARISON:  01/30/2018 FINDINGS: Segmentation:  5 lumbar type vertebrae Alignment:  Unremarkable Vertebrae: Intense marrow edema around the L5-S1 disc space which is T2 hyperintense with enhancement and endplate destruction. Paravertebral inflammation at this level. Remote L2-3 discitis/osteomyelitis with fatty marrow conversion and no residual edema. Patchy fatty marrow conversion at other levels including T10-11  and T11-12. Remote L1 superior endplate fracture. Conus medullaris and cauda equina: Conus extends to the L1-2 level. Conus and cauda equina appear normal. Paraspinal and other soft tissues: Negative for perispinal mass or inflammation Disc levels: T12- L1: Ventral spondylitic spurring L1-L2: Disc narrowing and bulging with left inferior foraminal protrusion. Mild facet spurring mild left foraminal narrowing L2-L3: Disc collapse and endplate degeneration with endplate ridging. Patent canal and foramina L3-L4: Disc narrowing and bulging.  Degenerative facet spurring. L4-L5: Degenerative facet spurring.  Disc narrowing and bulging L5-S1:Narrow, infected disc space with disc narrowing and left foraminal protrusion causing moderate left foraminal stenosis. IMPRESSION: 1. L5-S1 acute discitis/osteomyelitis. No soft tissue or epidural abscess. 2. Remote and healed L2-3 discitis/osteomyelitis. Electronically Signed   By: Audry Riles.D.  On: 07/04/2021 09:32    Procedures Procedures    Medications Ordered in ED Medications  oxyCODONE-acetaminophen (PERCOCET/ROXICET) 5-325 MG per tablet 1 tablet (1 tablet Oral Given 07/03/21 2315)  LORazepam (ATIVAN) injection 1 mg (1 mg Intravenous Given 07/04/21 0828)  sodium chloride 0.9 % bolus 1,000 mL (1,000 mLs Intravenous New Bag/Given 07/04/21 0749)  morphine (PF) 4 MG/ML injection 4 mg (4 mg Intravenous Given 07/04/21 0828)  gadobutrol (GADAVIST) 1 MMOL/ML injection 7.2 mL (7.2 mLs Intravenous Contrast Given 07/04/21 0913)    ED Course/ Medical Decision Making/ A&P                           Medical Decision Making Risk Prescription drug management.   This patient presents to the ED for concern of lumbar pain with radiculopathy, this involves an extensive number of treatment options, and is a complaint that carries with it a high risk of complications and morbidity.  The differential diagnosis includes osteomyelitis, spinal epidural abscess, worsening  radiculopathy, and others   Co morbidities that complicate the patient evaluation  History of IV drug abuse, history of osteomyelitis in the spine   Additional history obtained:   External records from outside source obtained and reviewed including discharge summary from hospitalization for spinal osteomyelitis   Lab Tests:  I Ordered, and personally interpreted labs.  The pertinent results include: Hemoglobin 12.5, glucose 187, creatinine 1.67   Imaging Studies ordered:  I ordered imaging studies including MR lumbar spine  I independently visualized and interpreted imaging which showed L5-S1 acute discitis/osteomyelitis.  No soft tissue or epidural abscess.  Remote and healed L2-3 discitis osteomyelitis I agree with the radiologist interpretation  Consultations Obtained:  I requested consultation with the infectious disease department, Dr. Elinor Parkinson and discussed lab and imaging findings as well as pertinent plan - they recommend: Consulting IR for to request biopsy Consulted IR to discuss case and request possible biopsy with Dr.Yamagata who stated that he did not perform disc aspiration, said to place order and the APP team would help arrange with other provider Consulted medicine team to discuss case and request hospital admission for IV antibiotics. Internal medicine teaching service agrees to admit   Problem List / ED Course / Critical interventions / Medication management   I ordered medication including oxycodone and morphine for pain, Ativan for anxiety pre-MRI Reevaluation of the patient after these medicines showed that the patient improved I have reviewed the patients home medicines and have made adjustments as needed   Social Determinants of Health:  Patient is IV drug user   Test / Admission - Considered:  The patient has osteomyelitis of his lumbar and sacral spine.  This will require hospitalization for IV antibiotics.  Infectious disease suggests  obtaining sample for culture prior to starting IV antibiotics.  Consulted IR to request treatment and evaluation for disc aspiration.  Patient will need admission to hospital at this time        Final Clinical Impression(s) / ED Diagnoses Final diagnoses:  Acute osteomyelitis of lumbar spine (HCC)  Bilateral low back pain with left-sided sciatica, unspecified chronicity    Rx / DC Orders ED Discharge Orders     None         Pamala Duffel 07/04/21 1044    Pricilla Loveless, MD 07/10/21 [phone]

## 2021-07-05 DIAGNOSIS — F319 Bipolar disorder, unspecified: Secondary | ICD-10-CM | POA: Diagnosis not present

## 2021-07-05 DIAGNOSIS — F1910 Other psychoactive substance abuse, uncomplicated: Secondary | ICD-10-CM

## 2021-07-05 DIAGNOSIS — M4626 Osteomyelitis of vertebra, lumbar region: Secondary | ICD-10-CM | POA: Diagnosis not present

## 2021-07-05 LAB — BASIC METABOLIC PANEL
Anion gap: 7 (ref 5–15)
BUN: 12 mg/dL (ref 6–20)
CO2: 25 mmol/L (ref 22–32)
Calcium: 8.8 mg/dL — ABNORMAL LOW (ref 8.9–10.3)
Chloride: 105 mmol/L (ref 98–111)
Creatinine, Ser: 0.83 mg/dL (ref 0.61–1.24)
GFR, Estimated: >60 mL/min (ref >60)
Glucose, Bld: 152 mg/dL — ABNORMAL HIGH (ref 70–99)
Potassium: 4.1 mmol/L (ref 3.5–5.1)
Sodium: 137 mmol/L (ref 135–145)

## 2021-07-05 LAB — CBC
HCT: 37.8 % — ABNORMAL LOW (ref 39.0–52.0)
Hemoglobin: 12.2 g/dL — ABNORMAL LOW (ref 13.0–17.0)
MCH: 29.5 pg (ref 26.0–34.0)
MCHC: 32.3 g/dL (ref 30.0–36.0)
MCV: 91.3 fL (ref 80.0–100.0)
Platelets: 146 10*3/uL — ABNORMAL LOW (ref 150–400)
RBC: 4.14 MIL/uL — ABNORMAL LOW (ref 4.22–5.81)
RDW: 13.9 % (ref 11.5–15.5)
WBC: 3.3 10*3/uL — ABNORMAL LOW (ref 4.0–10.5)
nRBC: 0 % (ref 0.0–0.2)

## 2021-07-05 LAB — GLUCOSE, CAPILLARY
Glucose-Capillary: 252 mg/dL — ABNORMAL HIGH (ref 70–99)
Glucose-Capillary: 118 mg/dL — ABNORMAL HIGH (ref 70–99)

## 2021-07-05 LAB — HEPATITIS B CORE ANTIBODY, TOTAL: Hep B Core Total Ab: REACTIVE — AB

## 2021-07-05 LAB — HIV ANTIBODY (ROUTINE TESTING W REFLEX): HIV Screen 4th Generation wRfx: NONREACTIVE

## 2021-07-05 MED ORDER — NALOXONE HCL 0.4 MG/ML IJ SOLN: 0.4000 mg | INTRAMUSCULAR | Status: DC | PRN

## 2021-07-05 MED ORDER — NAPROXEN 250 MG PO TABS
500.0000 mg | ORAL_TABLET | Freq: Two times a day (BID) | ORAL | Status: DC
  Administered 2021-07-05 – 2021-07-08 (×6): 500 mg via ORAL
  Filled 2021-07-05 (×6): qty 2

## 2021-07-05 MED ORDER — DIPHENHYDRAMINE HCL 50 MG/ML IJ SOLN: 12.5000 mg | Freq: Four times a day (QID) | INTRAMUSCULAR | Status: DC | PRN

## 2021-07-05 MED ORDER — SODIUM CHLORIDE 0.9% FLUSH: 9.0000 mL | INTRAVENOUS | Status: DC | PRN

## 2021-07-05 MED ORDER — NICOTINE 14 MG/24HR TD PT24
14.0000 mg | MEDICATED_PATCH | Freq: Every day | TRANSDERMAL | Status: DC
  Administered 2021-07-05 – 2021-07-08 (×4): 14 mg via TRANSDERMAL
  Filled 2021-07-05 (×4): qty 1

## 2021-07-05 MED ORDER — ONDANSETRON HCL 4 MG/2ML IJ SOLN
4.0000 mg | Freq: Four times a day (QID) | INTRAMUSCULAR | Status: DC | PRN
  Administered 2021-07-05: 4 mg via INTRAVENOUS
  Filled 2021-07-05: qty 2

## 2021-07-05 MED ORDER — HYDROXYZINE HCL 50 MG/ML IM SOLN
25.0000 mg | Freq: Four times a day (QID) | INTRAMUSCULAR | Status: DC | PRN
  Administered 2021-07-06: 25 mg via INTRAMUSCULAR
  Filled 2021-07-05 (×3): qty 0.5

## 2021-07-05 MED ORDER — NAPROXEN 250 MG PO TABS: 500.0000 mg | ORAL_TABLET | Freq: Two times a day (BID) | ORAL | Status: DC

## 2021-07-05 MED ORDER — DIPHENHYDRAMINE HCL 12.5 MG/5ML PO ELIX: 12.5000 mg | ORAL_SOLUTION | Freq: Four times a day (QID) | ORAL | Status: DC | PRN

## 2021-07-05 MED ORDER — HYDROMORPHONE 1 MG/ML IV SOLN
INTRAVENOUS | Status: DC
  Filled 2021-07-05: qty 30

## 2021-07-05 NOTE — Consult Note (Signed)
Neurosurgery Consultation  Reason for Consult: Lumbar discitis Referring Physician: Lafonda Mosses  CC: Low back pain  HPI: This is a 46 y.o. man with h/o IVDU, prior lumbar discitis that presents with severe back pain, no radicular pain, no new weakness, numbness, or parasthesias, no recent change in bowel or bladder function. No recent use of anti-platelet or anti-coagulant medications. Pain is sharp in quality, severe in intensity, worse with movement, improved with rest, located in the midline of the lumbar spine.    ROS: A 14 point ROS was performed and is negative except as noted in the HPI.   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 He has a 33.00 pack-year smoking history. He quit smokeless tobacco use about 14 years ago. He reports that he does not currently use alcohol after a past usage of about 1.0 standard drink of alcohol per week. He reports current drug use. Drugs: IV, Fentanyl, and Benzodiazepines.  Exam: Vital signs in last 24 hours: Temp:  [98.5 F (36.9 C)] 98.5 F (36.9 C) (06/25 1605) Pulse Rate:  [50-80] 50 (06/26 0511) Resp:   [13-20] 17 (06/26 0511) BP: (133-172)/(78-98) 144/84 (06/26 0511) SpO2:  [95 %-100 %] 99 % (06/26 0511) FiO2 (%):  [0 %] 0 % (06/26 0400) Weight:  [68.7 kg] 68.7 kg (06/25 1605) General: Awake, alert, cooperative, sitting up in bed, appears uncomfortable Head: Normocephalic and atruamatic HEENT: Neck supple Pulmonary: breathing room air comfortably, no evidence of increased work of breathing Cardiac: RRR Abdomen: S NT ND Extremities: Warm and well perfused x4 Neuro: AOx3, PERRL, EOMI, FS, speech fluent with normal content Strength 5/5 x4, SILTx4, no hoffman's, no clonus   Assessment and Plan: 47 y.o. man w/ IVDU and progressive LBP. MRI L-spine personally reviewed, which shows prior region of discitis with new changes at L5-S1 disc space c/w discitis.   -no acute neurosurgical intervention indicated, recommend IR sampling for speciation / sensitivities -please call with any concerns or questions  Jadene Pierini, MD 07/06/21 7:55 AM  [facility]

## 2021-07-06 ENCOUNTER — Inpatient Hospital Stay (HOSPITAL_COMMUNITY): Payer: 59

## 2021-07-06 ENCOUNTER — Encounter (HOSPITAL_COMMUNITY): Payer: Self-pay | Admitting: Internal Medicine

## 2021-07-06 DIAGNOSIS — M5442 Lumbago with sciatica, left side: Secondary | ICD-10-CM

## 2021-07-06 DIAGNOSIS — F1110 Opioid abuse, uncomplicated: Secondary | ICD-10-CM | POA: Diagnosis not present

## 2021-07-06 DIAGNOSIS — M4627 Osteomyelitis of vertebra, lumbosacral region: Secondary | ICD-10-CM | POA: Diagnosis not present

## 2021-07-06 DIAGNOSIS — M4626 Osteomyelitis of vertebra, lumbar region: Secondary | ICD-10-CM

## 2021-07-06 DIAGNOSIS — N179 Acute kidney failure, unspecified: Secondary | ICD-10-CM | POA: Diagnosis not present

## 2021-07-06 DIAGNOSIS — F1190 Opioid use, unspecified, uncomplicated: Secondary | ICD-10-CM

## 2021-07-06 DIAGNOSIS — F319 Bipolar disorder, unspecified: Secondary | ICD-10-CM | POA: Diagnosis not present

## 2021-07-06 DIAGNOSIS — M8610 Other acute osteomyelitis, unspecified site: Secondary | ICD-10-CM | POA: Diagnosis not present

## 2021-07-06 HISTORY — PX: IR LUMBAR DISC ASPIRATION W/IMG GUIDE: IMG5306

## 2021-07-06 LAB — BASIC METABOLIC PANEL
Anion gap: 8 (ref 5–15)
BUN: 12 mg/dL (ref 6–20)
CO2: 27 mmol/L (ref 22–32)
Calcium: 9.1 mg/dL (ref 8.9–10.3)
Chloride: 105 mmol/L (ref 98–111)
Creatinine, Ser: 0.81 mg/dL (ref 0.61–1.24)
GFR, Estimated: >60 mL/min (ref >60)
Glucose, Bld: 122 mg/dL — ABNORMAL HIGH (ref 70–99)
Potassium: 4.5 mmol/L (ref 3.5–5.1)
Sodium: 140 mmol/L (ref 135–145)

## 2021-07-06 LAB — CBC
HCT: 39.2 % (ref 39.0–52.0)
Hemoglobin: 12.4 g/dL — ABNORMAL LOW (ref 13.0–17.0)
MCH: 29.3 pg (ref 26.0–34.0)
MCHC: 31.6 g/dL (ref 30.0–36.0)
MCV: 92.7 fL (ref 80.0–100.0)
Platelets: 146 10*3/uL — ABNORMAL LOW (ref 150–400)
RBC: 4.23 MIL/uL (ref 4.22–5.81)
RDW: 14 % (ref 11.5–15.5)
WBC: 3.6 10*3/uL — ABNORMAL LOW (ref 4.0–10.5)
nRBC: 0 % (ref 0.0–0.2)

## 2021-07-06 LAB — PROTIME-INR
INR: 1 (ref 0.8–1.2)
Prothrombin Time: 13.1 seconds (ref 11.4–15.2)

## 2021-07-06 LAB — HCV RNA QUANT
HCV Quantitative Log: 1.301 log10 IU/mL — ABNORMAL LOW (ref >1.70)
HCV Quantitative: 20 IU/mL — ABNORMAL LOW (ref >50)

## 2021-07-06 MED ORDER — HYDROMORPHONE HCL 1 MG/ML IJ SOLN
1.0000 mg | INTRAMUSCULAR | Status: DC | PRN
  Administered 2021-07-06 – 2021-07-07 (×5): 1 mg via INTRAVENOUS
  Filled 2021-07-06 (×5): qty 1

## 2021-07-06 MED ORDER — HYDROMORPHONE HCL 1 MG/ML IJ SOLN
INTRAMUSCULAR | Status: AC
  Filled 2021-07-06: qty 1

## 2021-07-06 MED ORDER — VANCOMYCIN HCL IN DEXTROSE 1-5 GM/200ML-% IV SOLN
1000.0000 mg | Freq: Two times a day (BID) | INTRAVENOUS | Status: DC
Start: 2021-07-06 — End: 2021-07-08
  Administered 2021-07-06 – 2021-07-08 (×4): 1000 mg via INTRAVENOUS
  Filled 2021-07-06 (×5): qty 200

## 2021-07-06 MED ORDER — MIDAZOLAM HCL 2 MG/2ML IJ SOLN
INTRAMUSCULAR | Status: AC | PRN
  Administered 2021-07-06 (×4): 1 mg via INTRAVENOUS

## 2021-07-06 MED ORDER — LIDOCAINE 5 % EX PTCH
1.0000 | MEDICATED_PATCH | CUTANEOUS | Status: DC
  Administered 2021-07-06 – 2021-07-07 (×2): 1 via TRANSDERMAL
  Filled 2021-07-06 (×3): qty 1

## 2021-07-06 MED ORDER — CEFTRIAXONE SODIUM 2 G IJ SOLR
2.0000 g | INTRAMUSCULAR | Status: DC
  Administered 2021-07-06 – 2021-07-07 (×2): 2 g via INTRAVENOUS
  Filled 2021-07-06 (×2): qty 20

## 2021-07-06 MED ORDER — LIDOCAINE HCL (PF) 1 % IJ SOLN
INTRAMUSCULAR | Status: AC | PRN
  Administered 2021-07-06: 30 mL

## 2021-07-06 MED ORDER — BUPIVACAINE HCL (PF) 0.5 % IJ SOLN
INTRAMUSCULAR | Status: AC
  Filled 2021-07-06: qty 30

## 2021-07-06 MED ORDER — METHOCARBAMOL 1000 MG/10ML IJ SOLN
500.0000 mg | Freq: Four times a day (QID) | INTRAVENOUS | Status: DC | PRN
  Administered 2021-07-06 (×2): 500 mg via INTRAVENOUS
  Filled 2021-07-06: qty 5
  Filled 2021-07-06: qty 500
  Filled 2021-07-06: qty 5

## 2021-07-06 MED ORDER — OXYCODONE HCL ER 15 MG PO T12A
15.0000 mg | EXTENDED_RELEASE_TABLET | Freq: Two times a day (BID) | ORAL | Status: DC
  Administered 2021-07-06 – 2021-07-08 (×5): 15 mg via ORAL
  Filled 2021-07-06 (×5): qty 1

## 2021-07-06 MED ORDER — FENTANYL CITRATE (PF) 100 MCG/2ML IJ SOLN
INTRAMUSCULAR | Status: AC | PRN
  Administered 2021-07-06 (×3): 50 ug via INTRAVENOUS

## 2021-07-06 MED ORDER — LIDOCAINE HCL (PF) 1 % IJ SOLN
INTRAMUSCULAR | Status: AC
  Filled 2021-07-06: qty 30

## 2021-07-06 MED ORDER — MIDAZOLAM HCL 2 MG/2ML IJ SOLN
INTRAMUSCULAR | Status: AC
  Filled 2021-07-06: qty 6

## 2021-07-06 MED ORDER — FENTANYL CITRATE (PF) 100 MCG/2ML IJ SOLN
INTRAMUSCULAR | Status: AC
  Filled 2021-07-06: qty 4

## 2021-07-06 MED ORDER — HYDROMORPHONE HCL 1 MG/ML IJ SOLN
INTRAMUSCULAR | Status: AC | PRN
  Administered 2021-07-06: 1 mg via INTRAVENOUS

## 2021-07-06 MED ORDER — BUPIVACAINE HCL 0.25 % IJ SOLN
INTRAMUSCULAR | Status: AC | PRN
  Administered 2021-07-06: 20 mL

## 2021-07-06 NOTE — Progress Notes (Signed)
Mobility Specialist Progress Note   07/06/21 1130  Mobility  Activity Ambulated with assistance in hallway  Level of Assistance Modified independent, requires aide device or extra time  Assistive Device  (IV pole)  Distance Ambulated (ft) 300 ft  Activity Response Tolerated well  $Mobility charge 1 Mobility   Received pt in bed c/o discomfort in back but stating it was common, pt still agreeable to mobility. Required no physical assistance throughout but ambulation cut short d/t increase in pain in lower back causing pt to have this "limping - like" gait. Returned back to EOB w/o fault or incident and pt claiming pain to subside once seated. Left on EOB w/ call bell in reach and needs met. RN notified about pain.       Frederico Hamman Mobility Specialist Phone Number [phone]

## 2021-07-06 NOTE — Procedures (Signed)
Procedure note:   Procedure performed: L5-S1 disc aspiration  Preop diagnosis: Discitis-Osteomyelitis at L5-S1 level  Post op diagnosis: same  Attending: Dr. Marjo Bicker  Assistant: Dr. Tommie Sams  Access site: :Left L5-S1 disc space  Sedation: Moderate sedation  Complications: none immediate  EBL: minimal  Specimen: disc aspiration was performed using 20 and 18 G needles with saline ans was sent to microbiology  Findings: The procedure was difficult due to a narrow window between the L5-S1 disc and the the Rt iliac crest.   Patient tolerated the procedure well.  Bed rest: bed rest for 2 hrs.  Marjo Bicker, MD Pager# [phone]

## 2021-07-07 ENCOUNTER — Other Ambulatory Visit (HOSPITAL_COMMUNITY): Payer: Self-pay

## 2021-07-07 DIAGNOSIS — M4646 Discitis, unspecified, lumbar region: Secondary | ICD-10-CM

## 2021-07-07 DIAGNOSIS — M4626 Osteomyelitis of vertebra, lumbar region: Secondary | ICD-10-CM | POA: Diagnosis not present

## 2021-07-07 DIAGNOSIS — G8929 Other chronic pain: Secondary | ICD-10-CM

## 2021-07-07 DIAGNOSIS — F1190 Opioid use, unspecified, uncomplicated: Secondary | ICD-10-CM | POA: Diagnosis not present

## 2021-07-07 DIAGNOSIS — M5442 Lumbago with sciatica, left side: Secondary | ICD-10-CM | POA: Diagnosis not present

## 2021-07-07 LAB — CBC
HCT: 38.6 % — ABNORMAL LOW (ref 39.0–52.0)
Hemoglobin: 12.5 g/dL — ABNORMAL LOW (ref 13.0–17.0)
MCH: 29.9 pg (ref 26.0–34.0)
MCHC: 32.4 g/dL (ref 30.0–36.0)
MCV: 92.3 fL (ref 80.0–100.0)
Platelets: 155 10*3/uL (ref 150–400)
RBC: 4.18 MIL/uL — ABNORMAL LOW (ref 4.22–5.81)
RDW: 13.8 % (ref 11.5–15.5)
WBC: 4.1 10*3/uL (ref 4.0–10.5)
nRBC: 0 % (ref 0.0–0.2)

## 2021-07-07 LAB — BASIC METABOLIC PANEL
Anion gap: 6 (ref 5–15)
BUN: 13 mg/dL (ref 6–20)
CO2: 28 mmol/L (ref 22–32)
Calcium: 9.1 mg/dL (ref 8.9–10.3)
Chloride: 103 mmol/L (ref 98–111)
Creatinine, Ser: 0.98 mg/dL (ref 0.61–1.24)
GFR, Estimated: >60 mL/min (ref >60)
Glucose, Bld: 187 mg/dL — ABNORMAL HIGH (ref 70–99)
Potassium: 4.1 mmol/L (ref 3.5–5.1)
Sodium: 137 mmol/L (ref 135–145)

## 2021-07-07 LAB — SEDIMENTATION RATE: Sed Rate: 33 mm/hr — ABNORMAL HIGH (ref 0–16)

## 2021-07-07 LAB — HEPATITIS B SURFACE ANTIBODY, QUANTITATIVE: Hep B S AB Quant (Post): >1000 m[IU]/mL (ref >9.9)

## 2021-07-07 LAB — GLUCOSE, CAPILLARY: Glucose-Capillary: 125 mg/dL — ABNORMAL HIGH (ref 70–99)

## 2021-07-07 MED ORDER — HYDROMORPHONE HCL 2 MG PO TABS
2.0000 mg | ORAL_TABLET | Freq: Four times a day (QID) | ORAL | Status: DC | PRN
  Administered 2021-07-07 – 2021-07-08 (×4): 2 mg via ORAL
  Filled 2021-07-07 (×4): qty 1

## 2021-07-07 MED ORDER — ACETAMINOPHEN 325 MG PO TABS
650.0000 mg | ORAL_TABLET | Freq: Four times a day (QID) | ORAL | Status: DC | PRN
Start: 2021-07-07 — End: 2021-07-08
  Administered 2021-07-07: 650 mg via ORAL
  Filled 2021-07-07: qty 2

## 2021-07-07 MED ORDER — METHOCARBAMOL 500 MG PO TABS
500.0000 mg | ORAL_TABLET | Freq: Four times a day (QID) | ORAL | Status: DC | PRN
  Administered 2021-07-07 (×2): 500 mg via ORAL
  Filled 2021-07-07 (×2): qty 1

## 2021-07-07 MED ORDER — ONDANSETRON HCL 4 MG/2ML IJ SOLN
4.0000 mg | Freq: Four times a day (QID) | INTRAMUSCULAR | Status: DC | PRN
  Administered 2021-07-07 – 2021-07-08 (×2): 4 mg via INTRAVENOUS
  Filled 2021-07-07 (×2): qty 2

## 2021-07-07 NOTE — Progress Notes (Signed)
Telemetry just called stating pt just had 6 beats VTACH. Pt up bedside using urinal during episode. Pt has been sinus brady all night 40's/50's. Pt now NSR 78. WCTM

## 2021-07-07 NOTE — Evaluation (Signed)
Physical Therapy Evaluation Patient Details Name: Joshua Thomas MRN: [id number] DOB: 02-18-1975 Today's Date: 07/07/2021  History of Present Illness  Pt is a 46 y/o male who presents to acute care physical therapy due to back pain with acute L5-S1 discitis/osteomyelitis. Pt has PMH of COPD, hepatitis C, DM, bipolar 1, IVDU, and L2-3 discitis and osteomyelitis in 2019.  Clinical Impression  Pt agreeable to physical therapy evaluation although he required increased encouragement. Pt currently performing all mobility at independent level. Pt with mild gait deficits but unwilling to attempt with single point cane to assist L LE. Pt not good candidate for any further physical therapy and has no acute needs. Will discharge from PT.      Recommendations for follow up therapy are one component of a multi-disciplinary discharge planning process, led by the attending physician.  Recommendations may be updated based on patient status, additional functional criteria and insurance authorization.  Follow Up Recommendations No PT follow up      Assistance Recommended at Discharge PRN  Patient can return home with the following  Assist for transportation;Assistance with cooking/housework    Equipment Recommendations    Recommendations for Other Services       Functional Status Assessment       Precautions / Restrictions Precautions Precautions: None Restrictions Weight Bearing Restrictions: No      Mobility  Bed Mobility Overal bed mobility: Independent                  Transfers Overall transfer level: Independent Equipment used: None                    Ambulation/Gait Ambulation/Gait assistance: Supervision, Independent Gait Distance (Feet): 300 Feet Assistive device: None Gait Pattern/deviations: Decreased stance time - left, Antalgic   Gait velocity interpretation: 1.31 - 2.62 ft/sec, indicative of limited community ambulator   General Gait Details: Pt  ambulating at independent level (supervision for safety/eval purposes only). Pt unwilling to try using SPC to offload L LE.  Stairs            Wheelchair Mobility    Modified Rankin (Stroke Patients Only)       Balance                                             Pertinent Vitals/Pain Pain Assessment Pain Assessment: 0-10 Pain Score: 6  Pain Location: low back and left hip Pain Descriptors / Indicators: Guarding Pain Intervention(s): Limited activity within patient's tolerance, Premedicated before session, Monitored during session    Home Living Family/patient expects to be discharged to:: Private residence Living Arrangements:  (living with sister and her kids but may be staying with mother) Available Help at Discharge: Available PRN/intermittently Type of Home: House Home Access: Stairs to enter Entrance Stairs-Rails: None Entrance Stairs-Number of Steps: 2   Home Layout: One level Home Equipment: None      Prior Function Prior Level of Function : Independent/Modified Independent (Pt trying to get on disability (applied about a year ago). Pt does not drive and mostly stays at home.)             Mobility Comments: Does not use AD. ADLs Comments: Pt indep with basic ADL's but not IADL's as he does not drive.     Hand Dominance        Extremity/Trunk Assessment  Upper Extremity Assessment Upper Extremity Assessment: Overall WFL for tasks assessed    Lower Extremity Assessment Lower Extremity Assessment: Overall WFL for tasks assessed       Communication   Communication: No difficulties  Cognition Arousal/Alertness: Awake/alert Behavior During Therapy: WFL for tasks assessed/performed Overall Cognitive Status: Within Functional Limits for tasks assessed                                          General Comments General comments (skin integrity, edema, etc.): VSS on RA (HR at 71)    Exercises      Assessment/Plan    PT Assessment Patient does not need any further PT services  PT Problem List         PT Treatment Interventions      PT Goals (Current goals can be found in the Care Plan section)       Frequency       Co-evaluation               AM-PAC PT "6 Clicks" Mobility  Outcome Measure Help needed turning from your back to your side while in a flat bed without using bedrails?: None Help needed moving from lying on your back to sitting on the side of a flat bed without using bedrails?: None Help needed moving to and from a bed to a chair (including a wheelchair)?: None Help needed standing up from a chair using your arms (e.g., wheelchair or bedside chair)?: None Help needed to walk in hospital room?: None Help needed climbing 3-5 steps with a railing? : None 6 Click Score: 24    End of Session   Activity Tolerance: Patient limited by pain Patient left: in bed;with call bell/phone within reach Nurse Communication: Mobility status;Patient requests pain meds PT Visit Diagnosis: Pain    Time: 4401-0272 PT Time Calculation (min) (ACUTE ONLY): 18 min   Charges:   PT Evaluation $PT Eval Low Complexity: 1 Low          Tana Coast, PT   [employer] 07/07/2021, 1:38 PM

## 2021-07-07 NOTE — Progress Notes (Signed)
Pt's BP 158/107. Per pt he is feeling anxious; refuses to take his prn antianxiety medication. MD Cyndie Chime is notified. No new orders.

## 2021-07-08 ENCOUNTER — Other Ambulatory Visit (HOSPITAL_COMMUNITY): Payer: Self-pay

## 2021-07-08 DIAGNOSIS — M5442 Lumbago with sciatica, left side: Secondary | ICD-10-CM | POA: Diagnosis not present

## 2021-07-08 DIAGNOSIS — M4626 Osteomyelitis of vertebra, lumbar region: Secondary | ICD-10-CM | POA: Diagnosis not present

## 2021-07-08 DIAGNOSIS — G8929 Other chronic pain: Secondary | ICD-10-CM | POA: Diagnosis not present

## 2021-07-08 DIAGNOSIS — F1190 Opioid use, unspecified, uncomplicated: Secondary | ICD-10-CM | POA: Diagnosis not present

## 2021-07-08 LAB — CBC
HCT: 41.6 % (ref 39.0–52.0)
Hemoglobin: 13.2 g/dL (ref 13.0–17.0)
MCH: 29.7 pg (ref 26.0–34.0)
MCHC: 31.7 g/dL (ref 30.0–36.0)
MCV: 93.5 fL (ref 80.0–100.0)
Platelets: 161 10*3/uL (ref 150–400)
RBC: 4.45 MIL/uL (ref 4.22–5.81)
RDW: 13.7 % (ref 11.5–15.5)
WBC: 4.2 10*3/uL (ref 4.0–10.5)
nRBC: 0 % (ref 0.0–0.2)

## 2021-07-08 MED ORDER — LINEZOLID 600 MG PO TABS
600.0000 mg | ORAL_TABLET | Freq: Two times a day (BID) | ORAL | 0 refills | Status: AC
  Filled 2021-07-08: qty 60, 30d supply, fill #0

## 2021-07-08 MED ORDER — METHOCARBAMOL 500 MG PO TABS
500.0000 mg | ORAL_TABLET | Freq: Four times a day (QID) | ORAL | 0 refills | Status: AC | PRN
  Filled 2021-07-08: qty 20, 5d supply, fill #0

## 2021-07-08 MED ORDER — LIDOCAINE 5 % EX PTCH
1.0000 | MEDICATED_PATCH | CUTANEOUS | 0 refills | Status: DC
  Filled 2021-07-08: qty 30, 30d supply, fill #0

## 2021-07-08 MED ORDER — HYDROMORPHONE HCL 2 MG PO TABS
4.0000 mg | ORAL_TABLET | Freq: Four times a day (QID) | ORAL | 0 refills | Status: AC | PRN
  Filled 2021-07-08: qty 40, 5d supply, fill #0

## 2021-07-08 MED ORDER — NAPROXEN 500 MG PO TABS
500.0000 mg | ORAL_TABLET | Freq: Two times a day (BID) | ORAL | 0 refills | Status: AC
  Filled 2021-07-08: qty 20, 10d supply, fill #0

## 2021-07-08 MED ORDER — GABAPENTIN 800 MG PO TABS
800.0000 mg | ORAL_TABLET | Freq: Two times a day (BID) | ORAL | 0 refills | Status: DC
  Filled 2021-07-08: qty 6, 3d supply, fill #0

## 2021-07-08 MED ORDER — CIPROFLOXACIN HCL 750 MG PO TABS
750.0000 mg | ORAL_TABLET | Freq: Two times a day (BID) | ORAL | 0 refills | Status: AC
  Filled 2021-07-08: qty 60, 30d supply, fill #0

## 2021-07-08 NOTE — Plan of Care (Signed)
  Problem: Education: Goal: Knowledge of General Education information will improve Description: Including pain rating scale, medication(s)/side effects and non-pharmacologic comfort measures Outcome: Progressing   Problem: Health Behavior/Discharge Planning: Goal: Ability to manage health-related needs will improve Outcome: Progressing   Problem: Coping: Goal: Level of anxiety will decrease Outcome: Progressing   Problem: Pain Managment: Goal: General experience of comfort will improve Outcome: Progressing   Problem: Safety: Goal: Ability to remain free from injury will improve Outcome: Progressing   

## 2021-07-08 NOTE — Progress Notes (Signed)
Pt is being discharged. Discharge instructions are given. Medications are provided to the patient by pharmacy. All questions answered.

## 2021-07-08 NOTE — Discharge Summary (Addendum)
Name: Joshua Thomas MRN: [id number] DOB: May 30, 1975 46 y.o. PCP: Pcp, No  Date of Admission: 07/03/2021 11:00 PM Date of Discharge: No discharge date for patient encounter. Attending Physician: Lottie Mussel, MD  Discharge Diagnosis: 1. Principal Problem:   Acute osteomyelitis ([place]) Active Problems:   Acute osteomyelitis of lumbar spine ([facility])   Opioid use disorder   Bilateral low back pain with left-sided sciatica   Discharge Medications: Allergies as of 07/08/2021       Reactions   Morphine Itching   Tylenol [acetaminophen] Other (See Comments)   Due to Ulcers/ Liver Damage        Medication List     STOP taking these medications    cyclobenzaprine 10 MG tablet Commonly known as: FLEXERIL   etodolac 300 MG capsule Commonly known as: LODINE   oxyCODONE-acetaminophen 5-325 MG tablet Commonly known as: PERCOCET/ROXICET       TAKE these medications    ciprofloxacin 750 MG tablet Commonly known as: CIPRO Take 1 tablet (750 mg total) by mouth 2 (two) times daily.   diclofenac Sodium 1 % Gel Commonly known as: VOLTAREN Apply 4 g topically 4 (four) times daily. Apply to affected areas 4 times daily as needed for pain.   gabapentin 800 MG tablet Commonly known as: Neurontin Take 1 tablet (800 mg total) by mouth 2 (two) times daily for 3 days.   HYDROmorphone 2 MG tablet Commonly known as: Dilaudid Take 2 tablets (4 mg total) by mouth every 6 (six) hours as needed for up to 5 days for severe pain.   lidocaine 5 % Commonly known as: LIDODERM Place 1 patch onto the skin daily. Remove & Discard patch within 12 hours or as directed by MD   linezolid 600 MG tablet Commonly known as: ZYVOX Take 1 tablet (600 mg total) by mouth 2 (two) times daily.   methocarbamol 500 MG tablet Commonly known as: ROBAXIN Take 1 tablet (500 mg total) by mouth every 6 (six) hours as needed for up to 5 days for muscle spasms.   naloxone 4 MG/0.1ML Liqd nasal spray  kit Commonly known as: NARCAN Take in case of overdose   naproxen 500 MG tablet Commonly known as: NAPROSYN Take 1 tablet (500 mg total) by mouth 2 (two) times daily with a meal for 10 days.   QUEtiapine 100 MG tablet Commonly known as: SEROQUEL Take 100 mg by mouth at bedtime.   sertraline 100 MG tablet Commonly known as: ZOLOFT   TUMS PO Take 2-3 tablets by mouth as needed (gerd).               Discharge Care Instructions  (From admission, onward)           Start     Ordered   07/08/21 0000  No dressing needed        07/08/21 1325            Disposition and follow-up:   [name] was discharged from [facility] in Stable condition.  At the hospital follow up visit please address:  1.  Osteomyelitis/discitis.-Patient underwent biopsy aspiration, however poor sampling and no growth in cultures.  He will be discharged with a prolonged treatment course of oral linezolid 600 mg twice daily and oral Cipro high-dose 750 mg twice daily for 6 weeks.  He has follow-up with Dr. Juleen China of infectious disease on 7/14.  He will be discharged with a course of Dilaudid to take for pain control.  He will take 4 mg oral Dilaudid 4 times daily with the goal of tapering down.  Ideally will not require oral opioids approximately 2 days before he was seen in [facility] clinic on Thursday, July 6 at 915 with the intention of initiating Suboxone then.  We will also give him short course of naproxen, gabapentin, Tylenol, lidocaine patches, and Robaxin for pain control to utilize once tapered off of opioids.  Opioid use disorder-patient is being discharged with a short course of Dilaudid 4 mg oral to take 4 times daily for pain control of his osteomyelitis.  He has been instructed to taper this down and then utilize other multimodal agents including naproxen, gabapentin, Tylenol, lidocaine patches, Robaxin for approximately 2 days prior to being seen in the [facility] clinic on  07/16/2021 where he will then be initiated on Suboxone.   2.  Labs / imaging needed at time of follow-up: CBC, BMP  3.  Pending labs/ test needing follow-up: None  Follow-up Appointments:  Follow-up Information     [facility]. Schedule an appointment as soon as possible for a visit.   Why: Call to establish for primary care Contact information: [address] [place] [address] [phone]        [facility] Follow up in 7 day(s).   Why: Follow-up in the internal medicine center on July 6 at 9:15 AM.  The clinic is in the basement of [place] information: [address] [facility]              .   Contact information: [address] [place] [phone]              Follow up with suboxone clinic and [facility] to establish care Follow up with ID clinic 07/24/2021   Hospital Course by problem list: 1.  Osteomyelitis/discitis.-Patient presented with approximately 1 month of back pain that was worsening.  Imaging obtained in the emergency department showed new acute osteomyelitis/discitis of his lumbar and sacral spine.  Infectious disease was consulted for assistance of osteomyelitis.  Interventional radiology was consulted for possible biopsy, and performed biopsy, however yield was poor and showed no growth of organisms.  Patient was started on IV antibiotics after biopsy was performed and transitioned over to course of oral linezolid and oral Cipro to be taken as an outpatient once biopsy read resulted.  Cultures remained negative throughout hospitalization.  He was noted to have some bradycardia with 6 beats of V. tach 1 morning, EKG was obtained and normal and the bradycardia was thought to be related to his opioid use and sleep as heart rate improved upon waking.  Physical  therapy evaluate the patient given his back pain and felt no need for rehab.  Lastly given the findings on MRI, neurosurgery was asked to evaluate the patient, however given his grossly normal neurologic exam felt no need for surgical intervention at this time.  Additionally for pain control he was managed with IV opioids, and trialed on a PCA pump, however due to his history of opioid use disorder and the feelings having control over the PCA pump triggered he was transitioned back to scheduled opioids to be administered via the nurses.  In preparation for his discharge he was transitioned off of IV to oral.  He was instructed to follow-up closely with  [facility] to initiate Suboxone.  Infectious disease also was scheduled follow-up appointment for July 14.  Acute kidney injury.  Patient presented with evidence of acute kidney injury, suspected to be prerenal in the setting of significant diaphoresis.  He received IV fluids with resolution.  Renal function remained stable throughout the remainder of the hospitalization.  Patient has a history of bipolar and depression.  He was restarted on his quetiapine and Zoloft and continued on this throughout the hospitalization.  Discharge Exam:   BP 137/90 (BP Location: Right Arm)   Pulse (!) 57   Temp 97.9 F (36.6 C) (Oral)   Resp 15   Ht 5' 5"  (1.651 m)   Wt 68.7 kg   SpO2 98%   BMI 25.20 kg/m  Discharge exam:  Constitutional: Comfortably resting in bed Eyes: conjunctiva non-erythematous Cardiovascular: regular rate and rhythm, no m/r/g Pulmonary/Chest: normal work of breathing on room air, lungs clear to auscultation bilaterally MSK: normal bulk and tone, tender to palpation lumbar spine Neurological: alert & oriented x 3, 5/5 strength in bilateral upper and lower extremities, hip flexion limited secondary to pain.  Sensation grossly intact Skin: warm and dry, multiple excoriations Psych: Mood and affect appropriate  Pertinent Labs, Studies, and  Procedures:  MRI lumbar spine showing osteomyelitis, IR lumbar disc aspiration with no growth, EKG showing no acute changes, elevated inflammatory markers.  Discharge Instructions: Discharge Instructions     Call MD for:  difficulty breathing, headache or visual disturbances   Complete by: As directed    Call MD for:  extreme fatigue   Complete by: As directed    Call MD for:  hives   Complete by: As directed    Call MD for:  persistant dizziness or light-headedness   Complete by: As directed    Call MD for:  persistant nausea and vomiting   Complete by: As directed    Call MD for:  redness, tenderness, or signs of infection (pain, swelling, redness, odor or green/yellow discharge around incision site)   Complete by: As directed    Call MD for:  severe uncontrolled pain   Complete by: As directed    Call MD for:  temperature >100.4   Complete by: As directed    Diet - low sodium heart healthy   Complete by: As directed    Increase activity slowly   Complete by: As directed    No dressing needed   Complete by: As directed      You will be discharged from the hospital with 2 antibiotics.  First antibiotic is linezolid.  Please take 600 mg of linezolid twice daily.  He will take this antibiotic for 30 days, however the infectious disease doctors may make adjustments at your appointment with them.  The second is called ciprofloxacin.  Please take 750 mg of ciprofloxacin twice daily.  He will continue take this medication, however the infectious disease doctors may make adjustments during your appointment with them on July 14.  Please take these antibiotics every day.  You are also scheduled for a follow-up with the infectious disease doctors, Dr. Juleen China, on July 14.  The address has been attached to your discharge summary packet. For your back pain, we are discharging you with a prescription for oral Dilaudid.  You will take 4 mg, 2 pills at a time, 4 times a day/every 6 hours.  We are  giving you a 5-day course.  Please taper yourself off of this over the next couple days.  Our hope is that you do not need to take any of the Dilaudid approximately 2 days before your appointment with the most common internal medicine center clinic on July 6.  We also given you a prescription for gabapentin, Tylenol, naproxen, pain patches, and Robaxin to help with the pain while you are not on the opioids.  He will take a total of 1600 mg of gabapentin daily during those days.  Please apply 1 lidocaine patch every day.  You may take up to 1000 mg of Tylenol every 8 hours.  The Robaxin is a muscle relaxer, however it can make you sleepy so please do not take this before you drive or operate any sort of heavy machinery. Please also follow-up in the internal medicine center.  The clinic is located in the basement of [place] are scheduled for an appointment on July 6 at 9:15 AM.  This appointment will be very important to keep so that you can be started on the Suboxone and do not go into with withdrawals.  Signed: Delene Ruffini, MD 07/08/2021, 1:54 PM

## 2021-07-08 NOTE — Discharge Instructions (Addendum)
Dear Joshua Thomas,  Thank you for trusting [place] with your care.  We treated you for osteomyelitis/discitis.  You will be discharged from the hospital with 2 antibiotics.  First antibiotic is linezolid.  Please take 600 mg of linezolid twice daily.  He will take this antibiotic for 30 days, however the infectious disease doctors may make adjustments at your appointment with them.  The second is called ciprofloxacin.  Please take 750 mg of ciprofloxacin twice daily.  He will continue take this medication, however the infectious disease doctors may make adjustments during your appointment with them on July 14.  Please take these antibiotics every day.  You are also scheduled for a follow-up with the infectious disease doctors, Dr. Earlene Plater, on July 14.  The address has been attached to your discharge summary packet. For your back pain, we are discharging you with a prescription for oral Dilaudid.  You will take 4 mg, 2 pills at a time, 4 times a day/every 6 hours.  We are giving you a 5-day course.  Please taper yourself off of this over the next couple days.  Our hope is that you do not need to take any of the Dilaudid approximately 2 days before your appointment with the most common internal medicine center clinic on July 6.  We also given you a prescription for gabapentin, Tylenol, naproxen, pain patches, and Robaxin to help with the pain while you are not on the opioids.  He will take a total of 1600 mg of gabapentin daily during those days.  Please apply 1 lidocaine patch every day.  You may take up to 1000 mg of Tylenol every 8 hours.  The Robaxin is a muscle relaxer, however it can make you sleepy so please do not take this before you drive or operate any sort of heavy machinery. Please also follow-up in the internal medicine center.  The clinic is located in the basement of [facility].  You are scheduled for an appointment on July 6 at 9:15 AM.  This appointment will be very important to keep so  that you can be started on the Suboxone and do not go into with withdrawals.

## 2021-07-08 NOTE — Progress Notes (Signed)
[facility]   Reason for visit: follow up on discitis  Interval History: no growth in culture to date Day 2 vancomycin + ceftriaxone  Physical Exam: Constitutional:  Vitals:   07/08/21 0500 07/08/21 0753  BP: 116/67 137/90  Pulse: 60 (!) 57  Resp: 15   Temp: 97.9 F (36.6 C) 97.9 F (36.6 C)  SpO2: 100% 98%   patient appears in NAD Respiratory: normal respiratory effort  Review of Systems: Constitutional: negative for fevers and chills  Lab Results  Component Value Date   WBC 4.2 07/08/2021   HGB 13.2 07/08/2021   HCT 41.6 07/08/2021   MCV 93.5 07/08/2021   PLT 161 07/08/2021    Lab Results  Component Value Date   CREATININE 0.98 07/07/2021   BUN 13 07/07/2021   NA 137 07/07/2021   K 4.1 07/07/2021   CL 103 07/07/2021   CO2 28 07/07/2021    Lab Results  Component Value Date   ALT 16 07/03/2021   AST 19 07/03/2021   ALKPHOS 45 07/03/2021     Microbiology: Recent Results (from the past 240 hour(s))  Blood culture (routine x 2)     Status: None (Preliminary result)   Collection Time: 07/04/21 10:57 AM   Specimen: BLOOD RIGHT FOREARM  Result Value Ref Range Status   Specimen Description BLOOD RIGHT FOREARM  Final   Special Requests   Final    BOTTLES DRAWN AEROBIC AND ANAEROBIC Blood Culture adequate volume   Culture   Final    NO GROWTH 4 DAYS Performed at [facility], [address], [place], [place] [address]    Report Status PENDING  Incomplete  Blood culture (routine x 2)     Status: None (Preliminary result)   Collection Time: 07/04/21 11:35 AM   Specimen: BLOOD  Result Value Ref Range Status   Specimen Description BLOOD SITE NOT SPECIFIED  Final   Special Requests   Final    BOTTLES DRAWN AEROBIC AND ANAEROBIC Blood Culture adequate volume   Culture   Final    NO GROWTH 4 DAYS Performed at [facility], [address], [place], [place] [address]    Report Status PENDING  Incomplete  Body fluid  culture w Gram Stain     Status: None (Preliminary result)   Collection Time: 07/06/21  4:29 PM   Specimen: PATH Disc; Body Fluid  Result Value Ref Range Status   Specimen Description OTHER  Final   Special Requests DISC ASP L5/51  Final   Gram Stain NO WBC SEEN NO ORGANISMS SEEN   Final   Culture   Final    NO GROWTH 2 DAYS Performed at [facility], [address], [place], [place] [address]    Report Status PENDING  Incomplete    Impression/Plan:  1. L5-S1 discitis/osteomyelitis - aspiration done but poor sampling.  No growth in cultures.  BAseline ESR 33, CRP 6.3.  At this point, we can watch the cultures and will plan on a prolonged treatment course with oral linezolid + oral cipro high dose (750 mg) for 6 weeks.  We also can consider doxycycline + cefadroxil at a later point vs IV dalbavancin.  He will get a 30 day supply of linezolid and cipro to take home.   He has follow up on 7/14 with Dr. Juleen China  2.  IVDU - counseled on the need to quit.  He is motivated to quit and plans  to continue to follow in the IM clinic.    3.  History of HCV - negative 6 months ago and repeat here is 20 copies.  I suspect this is a false positive at this low level but he is at high risk so will repeat this as an outpatient.    I will sign off, call with questions

## 2021-07-08 NOTE — TOC Benefit Eligibility Note (Signed)
Patient Product/process development scientist completed.    The patient is currently admitted and upon discharge could be taking ciprofloxacin (Cipro) 750 mg tablets.  The current 30 day co-pay is, $0.00.   The patient is insured through [employer]     Roland Earl, CPhT Pharmacy Patient Advocate Specialist [facility] Patient Advocate Team Direct Number: [phone]  Fax: [phone]

## 2021-07-09 LAB — CULTURE, BLOOD (ROUTINE X 2)
Culture: NO GROWTH
Culture: NO GROWTH
Special Requests: ADEQUATE
Special Requests: ADEQUATE

## 2021-07-10 LAB — BODY FLUID CULTURE W GRAM STAIN
Culture: NO GROWTH
Gram Stain: NONE SEEN

## 2021-07-15 ENCOUNTER — Ambulatory Visit: Payer: 59 | Admitting: Student

## 2021-07-16 ENCOUNTER — Ambulatory Visit: Payer: 59 | Admitting: Student

## 2021-07-22 DIAGNOSIS — F329 Major depressive disorder, single episode, unspecified: Secondary | ICD-10-CM | POA: Diagnosis not present

## 2021-07-22 DIAGNOSIS — M14862 Arthropathies in other specified diseases classified elsewhere, left knee: Secondary | ICD-10-CM | POA: Diagnosis not present

## 2021-07-22 DIAGNOSIS — F319 Bipolar disorder, unspecified: Secondary | ICD-10-CM | POA: Diagnosis not present

## 2021-07-22 DIAGNOSIS — F1120 Opioid dependence, uncomplicated: Secondary | ICD-10-CM | POA: Diagnosis not present

## 2021-07-22 DIAGNOSIS — R69 Illness, unspecified: Secondary | ICD-10-CM | POA: Diagnosis not present

## 2021-07-22 DIAGNOSIS — E0865 Diabetes mellitus due to underlying condition with hyperglycemia: Secondary | ICD-10-CM | POA: Diagnosis not present

## 2021-07-22 DIAGNOSIS — F1320 Sedative, hypnotic or anxiolytic dependence, uncomplicated: Secondary | ICD-10-CM | POA: Diagnosis not present

## 2021-07-22 DIAGNOSIS — F1020 Alcohol dependence, uncomplicated: Secondary | ICD-10-CM | POA: Diagnosis not present

## 2021-07-22 DIAGNOSIS — F419 Anxiety disorder, unspecified: Secondary | ICD-10-CM | POA: Diagnosis not present

## 2021-07-22 DIAGNOSIS — B182 Chronic viral hepatitis C: Secondary | ICD-10-CM | POA: Diagnosis not present

## 2021-07-22 DIAGNOSIS — E781 Pure hyperglyceridemia: Secondary | ICD-10-CM | POA: Diagnosis not present

## 2021-07-22 DIAGNOSIS — F17200 Nicotine dependence, unspecified, uncomplicated: Secondary | ICD-10-CM | POA: Diagnosis not present

## 2021-07-22 DIAGNOSIS — F1420 Cocaine dependence, uncomplicated: Secondary | ICD-10-CM | POA: Diagnosis not present

## 2021-07-22 DIAGNOSIS — R12 Heartburn: Secondary | ICD-10-CM | POA: Diagnosis not present

## 2021-07-22 DIAGNOSIS — F1520 Other stimulant dependence, uncomplicated: Secondary | ICD-10-CM | POA: Diagnosis not present

## 2021-07-22 DIAGNOSIS — M4646 Discitis, unspecified, lumbar region: Secondary | ICD-10-CM | POA: Diagnosis not present

## 2021-07-22 DIAGNOSIS — R197 Diarrhea, unspecified: Secondary | ICD-10-CM | POA: Diagnosis not present

## 2021-07-24 ENCOUNTER — Telehealth: Payer: Self-pay

## 2021-07-24 ENCOUNTER — Inpatient Hospital Stay: Payer: 59 | Admitting: Internal Medicine

## 2021-07-24 NOTE — Progress Notes (Deleted)
[place] for Infectious Disease  CHIEF COMPLAINT:    Follow up for discitis  SUBJECTIVE:    Joshua Thomas is a 46 y.o. male with PMHx as below who presents to the clinic for discitis.   Patient is here today for planned follow up.  He was admitted at [facility] from 6/23 - 6/28 for L5 - S1 discitis.  Status post IR aspiration (cultures negative) and seen by Dr Linus Salmons while inpatient.  He was discharged on Cipro 750m BID and Linezolid 6035mBID for 6 weks total.  He is here today for follow up.   Please see A&P for the details of today's visit and status of the patient's medical problems.   Patient's Medications  New Prescriptions   No medications on file  Previous Medications   CALCIUM CARBONATE ANTACID (TUMS PO)    Take 2-3 tablets by mouth as needed (gerd).   CIPROFLOXACIN (CIPRO) 750 MG TABLET    Take 1 tablet (750 mg total) by mouth 2 (two) times daily.   DICLOFENAC SODIUM (VOLTAREN) 1 % GEL    Apply 4 g topically 4 (four) times daily. Apply to affected areas 4 times daily as needed for pain.   GABAPENTIN (NEURONTIN) 800 MG TABLET    Take 1 tablet (800 mg total) by mouth 2 (two) times daily for 3 days.   LIDOCAINE (LIDODERM) 5 %    Place 1 patch onto the skin daily. Remove & Discard patch within 12 hours or as directed by MD   LINEZOLID (ZYVOX) 600 MG TABLET    Take 1 tablet (600 mg total) by mouth 2 (two) times daily.   NALOXONE (NARCAN) NASAL SPRAY 4 MG/0.1 ML    Take in case of overdose   QUETIAPINE (SEROQUEL) 100 MG TABLET    Take 100 mg by mouth at bedtime.   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   COPD (chronic  obstructive pulmonary disease) (HCC)    DDD (degenerative disc disease), lumbar    Degenerative disc disease, lumbar    Depression    Discitis    GERD (gastroesophageal reflux disease)    Hepatitis C    Hepatitis C    History of stomach ulcers    Hx of diabetes mellitus    due to infection   Hypertension    Lung nodules    3 on L and 2 on R   PTSD (post-traumatic stress disorder)    Sciatica    [place]    Social History   Tobacco Use   Smoking status: Every Day    Packs/day: 1.00    Years: 33.00    Total pack years: 33.00    Types: Cigarettes   Smokeless tobacco: Former    Quit date: 08/08/2006   Tobacco comments:    1 PPD  Substance Use Topics   Alcohol use: Not Currently    Alcohol/week: 1.0 standard drink of alcohol    Types: 1 Standard drinks or equivalent per week    Comment:  occasionally   Drug use: Yes    Types: IV, Fentanyl, Benzodiazepines    Comment: uses fentanyl every day    Family History  Problem Relation Age of Onset   Diabetes Mother    Alcohol abuse Father    Cirrhosis Father    Aneurysm Father     Allergies  Allergen Reactions   Morphine Itching   Tylenol [Acetaminophen] Other (See Comments)    Due to Ulcers/ Liver Damage    ROS   OBJECTIVE:    There were no vitals filed for this visit. There is no height or weight on file to calculate BMI.  Physical Exam   Labs and Microbiology:    Latest Ref Rng & Units 07/08/2021    3:15 AM 07/07/2021    4:09 AM 07/06/2021    4:49 AM  CBC  WBC 4.0 - 10.5 K/uL 4.2  4.1  3.6   Hemoglobin 13.0 - 17.0 g/dL 13.2  12.5  12.4   Hematocrit 39.0 - 52.0 % 41.6  38.6  39.2   Platelets 150 - 400 K/uL 161  155  146       Latest Ref Rng & Units 07/07/2021    4:09 AM 07/06/2021    4:49 AM 07/05/2021    2:57 AM  CMP  Glucose 70 - 99 mg/dL 187  122  152   BUN 6 - 20 mg/dL 13  12  12    Creatinine 0.61 - 1.24 mg/dL 0.98  0.81  0.83   Sodium 135 - 145 mmol/L 137  140  137   Potassium  3.5 - 5.1 mmol/L 4.1  4.5  4.1   Chloride 98 - 111 mmol/L 103  105  105   CO2 22 - 32 mmol/L 28  27  25    Calcium 8.9 - 10.3 mg/dL 9.1  9.1  8.8        ASSESSMENT & PLAN:    No problem-specific Assessment & Plan notes found for this encounter.   No orders of the defined types were placed in this encounter.    There are no diagnoses linked to this encounter.  Patient has L5-S1 discitis s/p aspiration with negative cultures although noted to be poor sampling.  He is on linezolid 666m BID and ciprofloxacin 7555mBID x 42 days.  He was given a 30 day supply at hospital discharge on 6/28 and seems to be tolerating thus far.  Will repeat CBC, CMP, ESR, CRP today.  If labs look okay, will prescribe 12 more days of medication to complete therapy.  Plan for follow up again in 2-3 weeks to repeat labs again.  [facility]  Advised to call them and schedule follow up again.  He has history of HCV with negative RNA 6 months ago.  Repeat last month was 20 copies considered possible a false positive.  Will repeat today.    AnRaynelle Highlandor Infectious Disease [facility] 07/24/2021, 5:27 AM

## 2021-07-24 NOTE — Telephone Encounter (Signed)
Called patient to reschedule missed appointment. No answer. Left HIPAA-compliant voicemail requesting call back.  ? ?Tiffinie Caillier E Analicia Skibinski, RN  ?

## 2021-07-28 DIAGNOSIS — F1520 Other stimulant dependence, uncomplicated: Secondary | ICD-10-CM | POA: Diagnosis not present

## 2021-07-28 DIAGNOSIS — F1190 Opioid use, unspecified, uncomplicated: Secondary | ICD-10-CM | POA: Diagnosis not present

## 2021-07-28 DIAGNOSIS — F1420 Cocaine dependence, uncomplicated: Secondary | ICD-10-CM | POA: Diagnosis not present

## 2021-07-28 DIAGNOSIS — F319 Bipolar disorder, unspecified: Secondary | ICD-10-CM | POA: Diagnosis not present

## 2021-07-28 DIAGNOSIS — M4626 Osteomyelitis of vertebra, lumbar region: Secondary | ICD-10-CM | POA: Diagnosis not present

## 2021-07-28 DIAGNOSIS — F1320 Sedative, hypnotic or anxiolytic dependence, uncomplicated: Secondary | ICD-10-CM | POA: Diagnosis not present

## 2021-07-28 DIAGNOSIS — R69 Illness, unspecified: Secondary | ICD-10-CM | POA: Diagnosis not present

## 2021-07-28 DIAGNOSIS — M4627 Osteomyelitis of vertebra, lumbosacral region: Secondary | ICD-10-CM | POA: Diagnosis not present

## 2021-07-28 DIAGNOSIS — M4646 Discitis, unspecified, lumbar region: Secondary | ICD-10-CM | POA: Diagnosis not present

## 2021-07-28 DIAGNOSIS — E781 Pure hyperglyceridemia: Secondary | ICD-10-CM | POA: Diagnosis not present

## 2021-07-28 DIAGNOSIS — F1020 Alcohol dependence, uncomplicated: Secondary | ICD-10-CM | POA: Diagnosis not present

## 2021-07-28 DIAGNOSIS — F419 Anxiety disorder, unspecified: Secondary | ICD-10-CM | POA: Diagnosis not present

## 2021-07-28 DIAGNOSIS — R197 Diarrhea, unspecified: Secondary | ICD-10-CM | POA: Diagnosis not present

## 2021-07-28 DIAGNOSIS — R12 Heartburn: Secondary | ICD-10-CM | POA: Diagnosis not present

## 2021-07-28 DIAGNOSIS — M14862 Arthropathies in other specified diseases classified elsewhere, left knee: Secondary | ICD-10-CM | POA: Diagnosis not present

## 2021-07-28 DIAGNOSIS — F329 Major depressive disorder, single episode, unspecified: Secondary | ICD-10-CM | POA: Diagnosis not present

## 2021-07-28 DIAGNOSIS — E0865 Diabetes mellitus due to underlying condition with hyperglycemia: Secondary | ICD-10-CM | POA: Diagnosis not present

## 2021-07-28 DIAGNOSIS — F4310 Post-traumatic stress disorder, unspecified: Secondary | ICD-10-CM | POA: Diagnosis not present

## 2021-07-28 DIAGNOSIS — F1120 Opioid dependence, uncomplicated: Secondary | ICD-10-CM | POA: Diagnosis not present

## 2021-07-28 DIAGNOSIS — F17200 Nicotine dependence, unspecified, uncomplicated: Secondary | ICD-10-CM | POA: Diagnosis not present

## 2021-07-28 DIAGNOSIS — B182 Chronic viral hepatitis C: Secondary | ICD-10-CM | POA: Diagnosis not present

## 2021-08-07 ENCOUNTER — Other Ambulatory Visit (HOSPITAL_COMMUNITY): Payer: Self-pay

## 2021-08-07 ENCOUNTER — Telehealth: Payer: Self-pay

## 2021-08-07 NOTE — Telephone Encounter (Signed)
RCID Patient Product/process development scientist completed.    The patient is insured through [employer].  Medication will need a PA.  [facility]  We will continue to follow to see if copay assistance is needed.  Clearance Coots, CPhT Specialty Pharmacy Patient [facility] for Infectious Disease Phone: [phone] Fax:  [phone]

## 2021-08-10 ENCOUNTER — Other Ambulatory Visit (HOSPITAL_COMMUNITY): Payer: Self-pay

## 2021-08-11 ENCOUNTER — Ambulatory Visit (INDEPENDENT_AMBULATORY_CARE_PROVIDER_SITE_OTHER): Payer: 59 | Admitting: Family

## 2021-08-11 ENCOUNTER — Other Ambulatory Visit: Payer: Self-pay

## 2021-08-11 ENCOUNTER — Encounter: Payer: Self-pay | Admitting: Family

## 2021-08-11 VITALS — BP 141/97 | HR 88 | Temp 98.2°F | Wt 153.0 lb

## 2021-08-11 DIAGNOSIS — M4626 Osteomyelitis of vertebra, lumbar region: Secondary | ICD-10-CM | POA: Diagnosis not present

## 2021-08-11 DIAGNOSIS — F1190 Opioid use, unspecified, uncomplicated: Secondary | ICD-10-CM | POA: Diagnosis not present

## 2021-08-11 DIAGNOSIS — B182 Chronic viral hepatitis C: Secondary | ICD-10-CM | POA: Diagnosis not present

## 2021-08-11 DIAGNOSIS — R69 Illness, unspecified: Secondary | ICD-10-CM | POA: Diagnosis not present

## 2021-08-11 NOTE — Progress Notes (Signed)
Subjective:    Patient ID: Joshua Thomas, male    DOB: 1975-12-06, 46 y.o.   MRN: [id number]  Chief Complaint  Patient presents with   New Patient (Initial Visit)    Hep c    HPI:  Joshua Thomas is a 46 y.o. male with previous medical history of of Bipolar 1, depresion, PTSD, COPD, Hepatitis C, hypertension, diabetes, and closed left fracture s/p IM nail and recently hospitalized with acute L5-S1 discitis osteomyelitis who presents today for hospitalization follow up and Hepatitis C.  Joshua Thomas was recently admitted to [facility] from 07/03/21 to 07/08/21 with worsening chronic low back pain and MRI results showing acute osteomyelitis of L5-S1. IR aspirated with poor sample and gram stain showing no organisms and cultures finalized without growth. Blood cultures were negative. Baseline ESR 33 and CRP 6.3. Treated with vancomycin and ceftriaxone that was transitioned to linezolid and ciprofloxacin at discharge. During hospitalization Hepatitis C RNA level was 20 with previous RNA level undetectable in December 2022.  Plan of course was to recheck RNA level as outpatient and continue linezolid and ciprofloxacin for 6 weeks. Unfortunately Joshua Thomas missed an appointment with Dr. Juleen Thomas on 07/24/21.   Joshua Thomas is currently at [facility] for substance use and has been taking his linezolid and ciprofloxacin as prescribed. Has long standing GERD and describes slow emptying of his stomach. Continues to have back pain that is significantly improved since leaving the hospital but has questions about pain management going forward. Exacerbated when coughing. Denies fevers, chills, or sweats. No saddle anesthesia or changes to bowel/bladder habits.  Joshua Thomas risk for acquiring Hepatitis C is drug use. Has not been treated to date. Current abdominal symptoms related to GERD and not directly liver. Denies jaundice or scleral icterus. No personal or family history of liver disease. Currently  in treatment at [facility] for polysubstance use.   Allergies  Allergen Reactions   Morphine Itching   Tylenol [Acetaminophen] Other (See Comments)    Due to Ulcers/ Liver Damage      Outpatient Medications Prior to Visit  Medication Sig Dispense Refill   Calcium Carbonate Antacid (TUMS PO) Take 2-3 tablets by mouth as needed (gerd).     ciprofloxacin (CIPRO) 750 MG tablet Take 750 mg by mouth 2 (two) times daily.     Insulin Aspart FlexPen (NOVOLOG) 100 UNIT/ML Inject into the skin.     lidocaine (LIDODERM) 5 % Place 1 patch onto the skin daily. Remove & Discard patch within 12 hours or as directed by MD 30 patch 0   linezolid (ZYVOX) 600 MG tablet Take 600 mg by mouth 2 (two) times daily.     metFORMIN (GLUCOPHAGE-XR) 750 MG 24 hr tablet SMARTSIG:1 Tablet(s) By Mouth Every Evening     omeprazole (PRILOSEC) 20 MG capsule SMARTSIG:1 Capsule(s) By Mouth Morning-Evening     prazosin (MINIPRESS) 1 MG capsule Take 1 mg by mouth at bedtime.     Psyllium (METAMUCIL) WAFR SMARTSIG:2 wafer By Mouth Every Morning     QUEtiapine (SEROQUEL) 100 MG tablet Take 100 mg by mouth at bedtime.     rOPINIRole (REQUIP) 0.5 MG tablet Take 0.5 mg by mouth at bedtime.     sertraline (ZOLOFT) 25 MG tablet Take 25 mg by mouth every morning.     thiamine (VITAMIN B1) 100 MG tablet Take 100 mg by mouth every morning.     diclofenac Sodium (VOLTAREN) 1 % GEL Apply 4 g topically 4 (four)  times daily. Apply to affected areas 4 times daily as needed for pain. (Patient not taking: Reported on 06/12/2021) 100 g 2   gabapentin (NEURONTIN) 800 MG tablet Take 1 tablet (800 mg total) by mouth 2 (two) times daily for 3 days. 6 tablet 0   naloxone (NARCAN) nasal spray 4 mg/0.1 mL Take in case of overdose (Patient not taking: Reported on 08/11/2021) 1 each 0   sertraline (ZOLOFT) 100 MG tablet  (Patient not taking: Reported on 08/11/2021)     No facility-administered medications prior to visit.     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   COPD (chronic obstructive pulmonary disease) (HCC)    DDD (degenerative disc disease), lumbar    Degenerative disc disease, lumbar    Depression    Discitis    GERD (gastroesophageal reflux disease)    Hepatitis C    Hepatitis C    History of stomach ulcers    Hx of diabetes mellitus    due to infection   Hypertension    Lung nodules    3 on L and 2 on R   PTSD (post-traumatic stress disorder)    Sciatica    Substance abuse ([place])      Past Surgical History:  Procedure Laterality Date   FRACTURE SURGERY     HARDWARE REMOVAL  01/27/2012   Procedure: HARDWARE REMOVAL;  Surgeon: Mcarthur Rossetti, MD;  Location: [facility];  Service: Orthopedics;  Laterality: Left;   IR LUMBAR DISC ASPIRATION W/IMG GUIDE  06/08/2017   IR LUMBAR DISC ASPIRATION W/IMG GUIDE  07/06/2021   RADIOLOGY WITH ANESTHESIA N/A 06/08/2017   Procedure: DISC ASPIRATION;  Surgeon: Luanne Bras, MD;  Location: [place];  Service: Radiology;  Laterality: N/A;   TIBIA IM NAIL INSERTION  01/27/2012   Procedure: INTRAMEDULLARY (IM) NAIL TIBIAL;  Surgeon: Mcarthur Rossetti, MD;  Location: [facility];  Service: Orthopedics;  Laterality: Left;  Removal of IM Rod and Screws Left Tibia with Exchange Nail, Allograft Bone Graft Left Tibia       Review of Systems  Constitutional:  Negative for chills, diaphoresis, fatigue and fever.  Respiratory:  Negative for cough, chest tightness, shortness of breath and wheezing.   Cardiovascular:  Negative for chest pain.  Gastrointestinal:  Negative for abdominal pain, diarrhea, nausea and vomiting.  Musculoskeletal:  Positive for back pain.      Objective:    BP (!) 141/97   Pulse 88   Temp 98.2 F (36.8 C) (Oral)   Wt 153 lb (69.4 kg)   BMI 25.46  kg/m  Nursing note and vital signs reviewed.  Physical Exam Constitutional:      General: He is not in acute distress.    Appearance: He is well-developed.  Cardiovascular:     Rate and Rhythm: Normal rate and regular rhythm.     Heart sounds: Normal heart sounds.  Pulmonary:     Effort: Pulmonary effort is normal.     Breath sounds: Normal breath sounds.  Skin:    General: Skin is warm and dry.  Neurological:     Mental Status: He is alert and oriented to person, place, and time.  Psychiatric:        Behavior: Behavior normal.  Thought Content: Thought content normal.        Judgment: Judgment normal.         03/14/2018    9:41 AM 02/28/2018   11:48 AM 02/14/2018   11:45 AM 01/31/2018   12:14 PM 12/13/2017   10:41 AM  Depression screen PHQ 2/9  Decreased Interest _0 Down, Depressed, Hopeless _1 PHQ - 2 Score _2 Altered sleeping _3 Tired, decreased energy _4 Change in appetite _5 Feeling bad or failure about yourself  _6 Trouble concentrating _7 Moving slowly or fidgety/restless _8 0 2  Suicidal thoughts 0 _9 PHQ-9 Score _10 Difficult doing work/chores Very difficult Extremely dIfficult Extremely dIfficult Very difficult Extremely dIfficult       Assessment & Plan:    Patient Active Problem List   Diagnosis Date Noted   Bilateral low back pain with left-sided sciatica    Acute osteomyelitis ([place]) 07/04/2021   Opioid use disorder, severe, dependence ([place]) 12/15/2020   Hepatitis C 12/15/2020   MDD (major depressive disorder), recurrent severe, without psychosis ([place]) 08/12/2020   Opioid use disorder 08/12/2020   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 Dotzler appears to have resolved Hepatitis C independently without treatment given his undetectable RNA level in December and low level viremia now which appears to be negligible at this point. Will plan on rechecking Hepatitis C RNA level in 6 months.        Relevant Medications   linezolid (ZYVOX) 600 MG tablet     Musculoskeletal and Integument   Acute osteomyelitis of lumbar spine [facility])    Mr. Eisenberg is nearing completion of 6 weeks of antibiotic therapy for culture negative L5-S1 discitis/osteomyelitis with linezolid and ciprofloxacin. Back pain improved with continued pain. Check lab work today including CBC and inflammatory markers. Recommend follow up with Neurosurgery for continued back pain and possible interventions. Will plan on stopping antibiotics and follow up with ID will be pending lab work results as needed.       Relevant Medications   linezolid (ZYVOX) 600 MG tablet   Other Relevant Orders   Sedimentation rate   C-reactive protein   CBC w/Diff     Other   Opioid use disorder    Mr. Dalton is currently at [facility]. I have concern about his continued back pain which will leave him susceptible to relapse of drug use if not managed satisfactorily although concern about continued opioid use. Recommend discussing options with [facility] and Primary Care Provider for additional suggestions/management.         I am having Joshua Thomas maintain his naloxone, diclofenac Sodium, sertraline, Calcium Carbonate Antacid (TUMS PO), QUEtiapine, lidocaine, gabapentin, prazosin, rOPINIRole, thiamine, sertraline, Metamucil, omeprazole, metFORMIN, Insulin Aspart FlexPen, linezolid, and ciprofloxacin.   Follow-up: Return in about 6 months (around 02/11/2022), or if symptoms worsen or fail to improve.   Terri Piedra, MSN, FNP-C  Nurse Practitioner [place] for Infectious Disease [place] number: [phone]

## 2021-08-11 NOTE — Assessment & Plan Note (Signed)
Joshua Thomas is nearing completion of 6 weeks of antibiotic therapy for culture negative L5-S1 discitis/osteomyelitis with linezolid and ciprofloxacin. Back pain improved with continued pain. Check lab work today including CBC and inflammatory markers. Recommend follow up with Neurosurgery for continued back pain and possible interventions. Will plan on stopping antibiotics and follow up with ID will be pending lab work results as needed.

## 2021-08-11 NOTE — Assessment & Plan Note (Signed)
Joshua Thomas appears to have resolved Hepatitis C independently without treatment given his undetectable RNA level in December and low level viremia now which appears to be negligible at this point. Will plan on rechecking Hepatitis C RNA level in 6 months.

## 2021-08-11 NOTE — Assessment & Plan Note (Signed)
Joshua Thomas is currently at [facility]. I have concern about his continued back pain which will leave him susceptible to relapse of drug use if not managed satisfactorily although concern about continued opioid use. Recommend discussing options with [facility] and Primary Care Provider for additional suggestions/management.

## 2021-08-11 NOTE — Patient Instructions (Addendum)
Nice to meet you.  Continue to take your medications as prescribed until complete.  We will check your lab work today.  Recommend follow up with Dr. Maurice Small from Neurosurgery [address] [place] [address]  [phone]  Plan for additional treatment pending lab work results.  Follow up appointment in 6 months to recheck Hepatitis C RNA level.

## 2021-08-12 ENCOUNTER — Telehealth: Payer: Self-pay

## 2021-08-12 LAB — CBC WITH DIFFERENTIAL/PLATELET
Absolute Monocytes: 360 cells/uL (ref 200–950)
Basophils Absolute: 10 cells/uL (ref 0–200)
Basophils Relative: 0.2 %
Eosinophils Absolute: 110 cells/uL (ref 15–500)
Eosinophils Relative: 2.2 %
HCT: 41.8 % (ref 38.5–50.0)
Hemoglobin: 13.9 g/dL (ref 13.2–17.1)
Lymphs Abs: 1355 cells/uL (ref 850–3900)
MCH: 30.9 pg (ref 27.0–33.0)
MCHC: 33.3 g/dL (ref 32.0–36.0)
MCV: 92.9 fL (ref 80.0–100.0)
MPV: 10.6 fL (ref 7.5–12.5)
Monocytes Relative: 7.2 %
Neutro Abs: 3165 cells/uL (ref 1500–7800)
Neutrophils Relative %: 63.3 %
Platelets: 95 10*3/uL — ABNORMAL LOW (ref 140–400)
RBC: 4.5 10*6/uL (ref 4.20–5.80)
RDW: 15.9 % — ABNORMAL HIGH (ref 11.0–15.0)
Total Lymphocyte: 27.1 %
WBC: 5 10*3/uL (ref 3.8–10.8)

## 2021-08-12 LAB — C-REACTIVE PROTEIN: CRP: 0.7 mg/L (ref <8.0)

## 2021-08-12 LAB — SEDIMENTATION RATE: Sed Rate: 6 mm/h (ref 0–15)

## 2021-08-12 NOTE — Telephone Encounter (Signed)
-----   Message from Veryl Speak, FNP sent at 08/12/2021  1:43 PM EDT ----- Please inform Mr. Hostetler that his lab work looks good with his inflammatory markers down. So I would recommend stopping antibiotics once they are completed and following up with Neurosurgery for his back.

## 2021-08-12 NOTE — Telephone Encounter (Signed)
Left voicemail asking patient to return my call.   Lynda Wanninger P Telecia Larocque, CMA  

## 2021-08-13 NOTE — Telephone Encounter (Signed)
Second attempt contacting patient - left voicemail asking patient to return my call.  Dori Devino Lesli Albee, CMA

## 2021-08-14 NOTE — Telephone Encounter (Signed)
Third attempt to reach patient, no answer. Left HIPAA compliant voicemail requesting callback.   Joshua Thomas Joshua Eeshan Verbrugge, RN  

## 2021-08-17 NOTE — Telephone Encounter (Signed)
Fourth attempt to reach patient, no answer. Left HIPAA compliant voicemail requesting callback.   Mailed letter to address on file requesting return call.   Sandie Ano, RN

## 2021-08-20 DIAGNOSIS — F1420 Cocaine dependence, uncomplicated: Secondary | ICD-10-CM | POA: Diagnosis not present

## 2021-08-20 DIAGNOSIS — F17200 Nicotine dependence, unspecified, uncomplicated: Secondary | ICD-10-CM | POA: Diagnosis not present

## 2021-08-20 DIAGNOSIS — F1020 Alcohol dependence, uncomplicated: Secondary | ICD-10-CM | POA: Diagnosis not present

## 2021-08-20 DIAGNOSIS — F319 Bipolar disorder, unspecified: Secondary | ICD-10-CM | POA: Diagnosis not present

## 2021-08-20 DIAGNOSIS — B182 Chronic viral hepatitis C: Secondary | ICD-10-CM | POA: Diagnosis not present

## 2021-08-20 DIAGNOSIS — R69 Illness, unspecified: Secondary | ICD-10-CM | POA: Diagnosis not present

## 2021-08-20 DIAGNOSIS — F419 Anxiety disorder, unspecified: Secondary | ICD-10-CM | POA: Diagnosis not present

## 2021-08-20 DIAGNOSIS — F1120 Opioid dependence, uncomplicated: Secondary | ICD-10-CM | POA: Diagnosis not present

## 2021-08-20 DIAGNOSIS — E0865 Diabetes mellitus due to underlying condition with hyperglycemia: Secondary | ICD-10-CM | POA: Diagnosis not present

## 2021-08-20 DIAGNOSIS — M4646 Discitis, unspecified, lumbar region: Secondary | ICD-10-CM | POA: Diagnosis not present

## 2021-08-20 DIAGNOSIS — F1320 Sedative, hypnotic or anxiolytic dependence, uncomplicated: Secondary | ICD-10-CM | POA: Diagnosis not present

## 2021-08-20 DIAGNOSIS — F1520 Other stimulant dependence, uncomplicated: Secondary | ICD-10-CM | POA: Diagnosis not present

## 2021-08-20 DIAGNOSIS — F329 Major depressive disorder, single episode, unspecified: Secondary | ICD-10-CM | POA: Diagnosis not present

## 2021-08-24 DIAGNOSIS — F319 Bipolar disorder, unspecified: Secondary | ICD-10-CM | POA: Diagnosis not present

## 2021-08-24 DIAGNOSIS — F1520 Other stimulant dependence, uncomplicated: Secondary | ICD-10-CM | POA: Diagnosis not present

## 2021-08-24 DIAGNOSIS — B182 Chronic viral hepatitis C: Secondary | ICD-10-CM | POA: Diagnosis not present

## 2021-08-24 DIAGNOSIS — M4646 Discitis, unspecified, lumbar region: Secondary | ICD-10-CM | POA: Diagnosis not present

## 2021-08-24 DIAGNOSIS — F1120 Opioid dependence, uncomplicated: Secondary | ICD-10-CM | POA: Diagnosis not present

## 2021-08-24 DIAGNOSIS — E0865 Diabetes mellitus due to underlying condition with hyperglycemia: Secondary | ICD-10-CM | POA: Diagnosis not present

## 2021-08-24 DIAGNOSIS — R69 Illness, unspecified: Secondary | ICD-10-CM | POA: Diagnosis not present

## 2021-08-24 DIAGNOSIS — F329 Major depressive disorder, single episode, unspecified: Secondary | ICD-10-CM | POA: Diagnosis not present

## 2021-08-24 DIAGNOSIS — F1320 Sedative, hypnotic or anxiolytic dependence, uncomplicated: Secondary | ICD-10-CM | POA: Diagnosis not present

## 2021-08-24 DIAGNOSIS — F1020 Alcohol dependence, uncomplicated: Secondary | ICD-10-CM | POA: Diagnosis not present

## 2021-08-24 DIAGNOSIS — F17200 Nicotine dependence, unspecified, uncomplicated: Secondary | ICD-10-CM | POA: Diagnosis not present

## 2021-08-24 DIAGNOSIS — F1420 Cocaine dependence, uncomplicated: Secondary | ICD-10-CM | POA: Diagnosis not present

## 2021-08-24 DIAGNOSIS — F419 Anxiety disorder, unspecified: Secondary | ICD-10-CM | POA: Diagnosis not present

## 2021-08-25 DIAGNOSIS — F419 Anxiety disorder, unspecified: Secondary | ICD-10-CM | POA: Diagnosis not present

## 2021-08-25 DIAGNOSIS — E0865 Diabetes mellitus due to underlying condition with hyperglycemia: Secondary | ICD-10-CM | POA: Diagnosis not present

## 2021-08-25 DIAGNOSIS — F17200 Nicotine dependence, unspecified, uncomplicated: Secondary | ICD-10-CM | POA: Diagnosis not present

## 2021-08-25 DIAGNOSIS — F1320 Sedative, hypnotic or anxiolytic dependence, uncomplicated: Secondary | ICD-10-CM | POA: Diagnosis not present

## 2021-08-25 DIAGNOSIS — F329 Major depressive disorder, single episode, unspecified: Secondary | ICD-10-CM | POA: Diagnosis not present

## 2021-08-25 DIAGNOSIS — R69 Illness, unspecified: Secondary | ICD-10-CM | POA: Diagnosis not present

## 2021-08-25 DIAGNOSIS — F319 Bipolar disorder, unspecified: Secondary | ICD-10-CM | POA: Diagnosis not present

## 2021-08-25 DIAGNOSIS — B182 Chronic viral hepatitis C: Secondary | ICD-10-CM | POA: Diagnosis not present

## 2021-08-25 DIAGNOSIS — F1520 Other stimulant dependence, uncomplicated: Secondary | ICD-10-CM | POA: Diagnosis not present

## 2021-08-25 DIAGNOSIS — F1120 Opioid dependence, uncomplicated: Secondary | ICD-10-CM | POA: Diagnosis not present

## 2021-08-25 DIAGNOSIS — M4646 Discitis, unspecified, lumbar region: Secondary | ICD-10-CM | POA: Diagnosis not present

## 2021-08-25 DIAGNOSIS — F1420 Cocaine dependence, uncomplicated: Secondary | ICD-10-CM | POA: Diagnosis not present

## 2021-08-25 DIAGNOSIS — F1020 Alcohol dependence, uncomplicated: Secondary | ICD-10-CM | POA: Diagnosis not present

## 2021-08-27 DIAGNOSIS — F17200 Nicotine dependence, unspecified, uncomplicated: Secondary | ICD-10-CM | POA: Diagnosis not present

## 2021-08-27 DIAGNOSIS — M4646 Discitis, unspecified, lumbar region: Secondary | ICD-10-CM | POA: Diagnosis not present

## 2021-08-27 DIAGNOSIS — R69 Illness, unspecified: Secondary | ICD-10-CM | POA: Diagnosis not present

## 2021-08-27 DIAGNOSIS — F1420 Cocaine dependence, uncomplicated: Secondary | ICD-10-CM | POA: Diagnosis not present

## 2021-08-27 DIAGNOSIS — F319 Bipolar disorder, unspecified: Secondary | ICD-10-CM | POA: Diagnosis not present

## 2021-08-27 DIAGNOSIS — F1120 Opioid dependence, uncomplicated: Secondary | ICD-10-CM | POA: Diagnosis not present

## 2021-08-27 DIAGNOSIS — F1520 Other stimulant dependence, uncomplicated: Secondary | ICD-10-CM | POA: Diagnosis not present

## 2021-08-27 DIAGNOSIS — F329 Major depressive disorder, single episode, unspecified: Secondary | ICD-10-CM | POA: Diagnosis not present

## 2021-08-27 DIAGNOSIS — F419 Anxiety disorder, unspecified: Secondary | ICD-10-CM | POA: Diagnosis not present

## 2021-08-27 DIAGNOSIS — B182 Chronic viral hepatitis C: Secondary | ICD-10-CM | POA: Diagnosis not present

## 2021-08-27 DIAGNOSIS — F1320 Sedative, hypnotic or anxiolytic dependence, uncomplicated: Secondary | ICD-10-CM | POA: Diagnosis not present

## 2021-08-27 DIAGNOSIS — F1020 Alcohol dependence, uncomplicated: Secondary | ICD-10-CM | POA: Diagnosis not present

## 2021-08-27 DIAGNOSIS — E0865 Diabetes mellitus due to underlying condition with hyperglycemia: Secondary | ICD-10-CM | POA: Diagnosis not present

## 2021-08-31 DIAGNOSIS — F329 Major depressive disorder, single episode, unspecified: Secondary | ICD-10-CM | POA: Diagnosis not present

## 2021-08-31 DIAGNOSIS — F419 Anxiety disorder, unspecified: Secondary | ICD-10-CM | POA: Diagnosis not present

## 2021-08-31 DIAGNOSIS — F1520 Other stimulant dependence, uncomplicated: Secondary | ICD-10-CM | POA: Diagnosis not present

## 2021-08-31 DIAGNOSIS — R69 Illness, unspecified: Secondary | ICD-10-CM | POA: Diagnosis not present

## 2021-08-31 DIAGNOSIS — M4646 Discitis, unspecified, lumbar region: Secondary | ICD-10-CM | POA: Diagnosis not present

## 2021-08-31 DIAGNOSIS — F17200 Nicotine dependence, unspecified, uncomplicated: Secondary | ICD-10-CM | POA: Diagnosis not present

## 2021-08-31 DIAGNOSIS — E0865 Diabetes mellitus due to underlying condition with hyperglycemia: Secondary | ICD-10-CM | POA: Diagnosis not present

## 2021-08-31 DIAGNOSIS — B182 Chronic viral hepatitis C: Secondary | ICD-10-CM | POA: Diagnosis not present

## 2021-08-31 DIAGNOSIS — F1420 Cocaine dependence, uncomplicated: Secondary | ICD-10-CM | POA: Diagnosis not present

## 2021-08-31 DIAGNOSIS — F1020 Alcohol dependence, uncomplicated: Secondary | ICD-10-CM | POA: Diagnosis not present

## 2021-08-31 DIAGNOSIS — F319 Bipolar disorder, unspecified: Secondary | ICD-10-CM | POA: Diagnosis not present

## 2021-08-31 DIAGNOSIS — F1320 Sedative, hypnotic or anxiolytic dependence, uncomplicated: Secondary | ICD-10-CM | POA: Diagnosis not present

## 2021-08-31 DIAGNOSIS — F1120 Opioid dependence, uncomplicated: Secondary | ICD-10-CM | POA: Diagnosis not present

## 2021-09-01 DIAGNOSIS — F17200 Nicotine dependence, unspecified, uncomplicated: Secondary | ICD-10-CM | POA: Diagnosis not present

## 2021-09-01 DIAGNOSIS — B182 Chronic viral hepatitis C: Secondary | ICD-10-CM | POA: Diagnosis not present

## 2021-09-01 DIAGNOSIS — M4646 Discitis, unspecified, lumbar region: Secondary | ICD-10-CM | POA: Diagnosis not present

## 2021-09-01 DIAGNOSIS — F419 Anxiety disorder, unspecified: Secondary | ICD-10-CM | POA: Diagnosis not present

## 2021-09-01 DIAGNOSIS — R69 Illness, unspecified: Secondary | ICD-10-CM | POA: Diagnosis not present

## 2021-09-01 DIAGNOSIS — F329 Major depressive disorder, single episode, unspecified: Secondary | ICD-10-CM | POA: Diagnosis not present

## 2021-09-01 DIAGNOSIS — F1020 Alcohol dependence, uncomplicated: Secondary | ICD-10-CM | POA: Diagnosis not present

## 2021-09-01 DIAGNOSIS — F1520 Other stimulant dependence, uncomplicated: Secondary | ICD-10-CM | POA: Diagnosis not present

## 2021-09-01 DIAGNOSIS — F1320 Sedative, hypnotic or anxiolytic dependence, uncomplicated: Secondary | ICD-10-CM | POA: Diagnosis not present

## 2021-09-01 DIAGNOSIS — E0865 Diabetes mellitus due to underlying condition with hyperglycemia: Secondary | ICD-10-CM | POA: Diagnosis not present

## 2021-09-01 DIAGNOSIS — F1420 Cocaine dependence, uncomplicated: Secondary | ICD-10-CM | POA: Diagnosis not present

## 2021-09-01 DIAGNOSIS — F319 Bipolar disorder, unspecified: Secondary | ICD-10-CM | POA: Diagnosis not present

## 2021-09-01 DIAGNOSIS — F1120 Opioid dependence, uncomplicated: Secondary | ICD-10-CM | POA: Diagnosis not present

## 2021-09-03 DIAGNOSIS — F329 Major depressive disorder, single episode, unspecified: Secondary | ICD-10-CM | POA: Diagnosis not present

## 2021-09-03 DIAGNOSIS — F1120 Opioid dependence, uncomplicated: Secondary | ICD-10-CM | POA: Diagnosis not present

## 2021-09-03 DIAGNOSIS — F1320 Sedative, hypnotic or anxiolytic dependence, uncomplicated: Secondary | ICD-10-CM | POA: Diagnosis not present

## 2021-09-03 DIAGNOSIS — B182 Chronic viral hepatitis C: Secondary | ICD-10-CM | POA: Diagnosis not present

## 2021-09-03 DIAGNOSIS — R69 Illness, unspecified: Secondary | ICD-10-CM | POA: Diagnosis not present

## 2021-09-03 DIAGNOSIS — F17200 Nicotine dependence, unspecified, uncomplicated: Secondary | ICD-10-CM | POA: Diagnosis not present

## 2021-09-03 DIAGNOSIS — F1520 Other stimulant dependence, uncomplicated: Secondary | ICD-10-CM | POA: Diagnosis not present

## 2021-09-03 DIAGNOSIS — F1420 Cocaine dependence, uncomplicated: Secondary | ICD-10-CM | POA: Diagnosis not present

## 2021-09-03 DIAGNOSIS — F1020 Alcohol dependence, uncomplicated: Secondary | ICD-10-CM | POA: Diagnosis not present

## 2021-09-03 DIAGNOSIS — F319 Bipolar disorder, unspecified: Secondary | ICD-10-CM | POA: Diagnosis not present

## 2021-09-03 DIAGNOSIS — F419 Anxiety disorder, unspecified: Secondary | ICD-10-CM | POA: Diagnosis not present

## 2021-09-03 DIAGNOSIS — E0865 Diabetes mellitus due to underlying condition with hyperglycemia: Secondary | ICD-10-CM | POA: Diagnosis not present

## 2021-09-03 DIAGNOSIS — M4646 Discitis, unspecified, lumbar region: Secondary | ICD-10-CM | POA: Diagnosis not present

## 2021-09-07 DIAGNOSIS — R69 Illness, unspecified: Secondary | ICD-10-CM | POA: Diagnosis not present

## 2021-09-07 DIAGNOSIS — F1120 Opioid dependence, uncomplicated: Secondary | ICD-10-CM | POA: Diagnosis not present

## 2021-09-07 DIAGNOSIS — F319 Bipolar disorder, unspecified: Secondary | ICD-10-CM | POA: Diagnosis not present

## 2021-09-07 DIAGNOSIS — F329 Major depressive disorder, single episode, unspecified: Secondary | ICD-10-CM | POA: Diagnosis not present

## 2021-09-07 DIAGNOSIS — E0865 Diabetes mellitus due to underlying condition with hyperglycemia: Secondary | ICD-10-CM | POA: Diagnosis not present

## 2021-09-07 DIAGNOSIS — F17200 Nicotine dependence, unspecified, uncomplicated: Secondary | ICD-10-CM | POA: Diagnosis not present

## 2021-09-07 DIAGNOSIS — F1020 Alcohol dependence, uncomplicated: Secondary | ICD-10-CM | POA: Diagnosis not present

## 2021-09-07 DIAGNOSIS — F1520 Other stimulant dependence, uncomplicated: Secondary | ICD-10-CM | POA: Diagnosis not present

## 2021-09-07 DIAGNOSIS — F419 Anxiety disorder, unspecified: Secondary | ICD-10-CM | POA: Diagnosis not present

## 2021-09-07 DIAGNOSIS — B182 Chronic viral hepatitis C: Secondary | ICD-10-CM | POA: Diagnosis not present

## 2021-09-07 DIAGNOSIS — F1320 Sedative, hypnotic or anxiolytic dependence, uncomplicated: Secondary | ICD-10-CM | POA: Diagnosis not present

## 2021-09-07 DIAGNOSIS — M4646 Discitis, unspecified, lumbar region: Secondary | ICD-10-CM | POA: Diagnosis not present

## 2021-09-07 DIAGNOSIS — F1420 Cocaine dependence, uncomplicated: Secondary | ICD-10-CM | POA: Diagnosis not present

## 2021-09-08 DIAGNOSIS — F17200 Nicotine dependence, unspecified, uncomplicated: Secondary | ICD-10-CM | POA: Diagnosis not present

## 2021-09-08 DIAGNOSIS — M4646 Discitis, unspecified, lumbar region: Secondary | ICD-10-CM | POA: Diagnosis not present

## 2021-09-08 DIAGNOSIS — F1320 Sedative, hypnotic or anxiolytic dependence, uncomplicated: Secondary | ICD-10-CM | POA: Diagnosis not present

## 2021-09-08 DIAGNOSIS — F319 Bipolar disorder, unspecified: Secondary | ICD-10-CM | POA: Diagnosis not present

## 2021-09-08 DIAGNOSIS — F419 Anxiety disorder, unspecified: Secondary | ICD-10-CM | POA: Diagnosis not present

## 2021-09-08 DIAGNOSIS — B182 Chronic viral hepatitis C: Secondary | ICD-10-CM | POA: Diagnosis not present

## 2021-09-08 DIAGNOSIS — R69 Illness, unspecified: Secondary | ICD-10-CM | POA: Diagnosis not present

## 2021-09-08 DIAGNOSIS — F1420 Cocaine dependence, uncomplicated: Secondary | ICD-10-CM | POA: Diagnosis not present

## 2021-09-08 DIAGNOSIS — F1520 Other stimulant dependence, uncomplicated: Secondary | ICD-10-CM | POA: Diagnosis not present

## 2021-09-08 DIAGNOSIS — E0865 Diabetes mellitus due to underlying condition with hyperglycemia: Secondary | ICD-10-CM | POA: Diagnosis not present

## 2021-09-08 DIAGNOSIS — F1120 Opioid dependence, uncomplicated: Secondary | ICD-10-CM | POA: Diagnosis not present

## 2021-09-08 DIAGNOSIS — F329 Major depressive disorder, single episode, unspecified: Secondary | ICD-10-CM | POA: Diagnosis not present

## 2021-09-08 DIAGNOSIS — F1020 Alcohol dependence, uncomplicated: Secondary | ICD-10-CM | POA: Diagnosis not present

## 2021-09-10 DIAGNOSIS — F4310 Post-traumatic stress disorder, unspecified: Secondary | ICD-10-CM | POA: Diagnosis not present

## 2021-09-10 DIAGNOSIS — F1120 Opioid dependence, uncomplicated: Secondary | ICD-10-CM | POA: Diagnosis not present

## 2021-09-10 DIAGNOSIS — M4646 Discitis, unspecified, lumbar region: Secondary | ICD-10-CM | POA: Diagnosis not present

## 2021-09-10 DIAGNOSIS — F319 Bipolar disorder, unspecified: Secondary | ICD-10-CM | POA: Diagnosis not present

## 2021-09-10 DIAGNOSIS — F329 Major depressive disorder, single episode, unspecified: Secondary | ICD-10-CM | POA: Diagnosis not present

## 2021-09-10 DIAGNOSIS — F1420 Cocaine dependence, uncomplicated: Secondary | ICD-10-CM | POA: Diagnosis not present

## 2021-09-10 DIAGNOSIS — E0865 Diabetes mellitus due to underlying condition with hyperglycemia: Secondary | ICD-10-CM | POA: Diagnosis not present

## 2021-09-10 DIAGNOSIS — F1520 Other stimulant dependence, uncomplicated: Secondary | ICD-10-CM | POA: Diagnosis not present

## 2021-09-10 DIAGNOSIS — F1320 Sedative, hypnotic or anxiolytic dependence, uncomplicated: Secondary | ICD-10-CM | POA: Diagnosis not present

## 2021-09-10 DIAGNOSIS — F1020 Alcohol dependence, uncomplicated: Secondary | ICD-10-CM | POA: Diagnosis not present

## 2021-09-10 DIAGNOSIS — F419 Anxiety disorder, unspecified: Secondary | ICD-10-CM | POA: Diagnosis not present

## 2021-09-10 DIAGNOSIS — R69 Illness, unspecified: Secondary | ICD-10-CM | POA: Diagnosis not present

## 2021-09-10 DIAGNOSIS — F17200 Nicotine dependence, unspecified, uncomplicated: Secondary | ICD-10-CM | POA: Diagnosis not present

## 2021-09-10 DIAGNOSIS — B182 Chronic viral hepatitis C: Secondary | ICD-10-CM | POA: Diagnosis not present

## 2021-09-14 DIAGNOSIS — F329 Major depressive disorder, single episode, unspecified: Secondary | ICD-10-CM | POA: Diagnosis not present

## 2021-09-14 DIAGNOSIS — M4646 Discitis, unspecified, lumbar region: Secondary | ICD-10-CM | POA: Diagnosis not present

## 2021-09-14 DIAGNOSIS — F1120 Opioid dependence, uncomplicated: Secondary | ICD-10-CM | POA: Diagnosis not present

## 2021-09-14 DIAGNOSIS — F419 Anxiety disorder, unspecified: Secondary | ICD-10-CM | POA: Diagnosis not present

## 2021-09-14 DIAGNOSIS — F1420 Cocaine dependence, uncomplicated: Secondary | ICD-10-CM | POA: Diagnosis not present

## 2021-09-14 DIAGNOSIS — F1520 Other stimulant dependence, uncomplicated: Secondary | ICD-10-CM | POA: Diagnosis not present

## 2021-09-14 DIAGNOSIS — R69 Illness, unspecified: Secondary | ICD-10-CM | POA: Diagnosis not present

## 2021-09-14 DIAGNOSIS — F319 Bipolar disorder, unspecified: Secondary | ICD-10-CM | POA: Diagnosis not present

## 2021-09-14 DIAGNOSIS — F1320 Sedative, hypnotic or anxiolytic dependence, uncomplicated: Secondary | ICD-10-CM | POA: Diagnosis not present

## 2021-09-14 DIAGNOSIS — F17200 Nicotine dependence, unspecified, uncomplicated: Secondary | ICD-10-CM | POA: Diagnosis not present

## 2021-09-14 DIAGNOSIS — F1020 Alcohol dependence, uncomplicated: Secondary | ICD-10-CM | POA: Diagnosis not present

## 2021-09-14 DIAGNOSIS — E0865 Diabetes mellitus due to underlying condition with hyperglycemia: Secondary | ICD-10-CM | POA: Diagnosis not present

## 2021-09-14 DIAGNOSIS — B182 Chronic viral hepatitis C: Secondary | ICD-10-CM | POA: Diagnosis not present

## 2021-09-15 DIAGNOSIS — F319 Bipolar disorder, unspecified: Secondary | ICD-10-CM | POA: Diagnosis not present

## 2021-09-15 DIAGNOSIS — F329 Major depressive disorder, single episode, unspecified: Secondary | ICD-10-CM | POA: Diagnosis not present

## 2021-09-15 DIAGNOSIS — B182 Chronic viral hepatitis C: Secondary | ICD-10-CM | POA: Diagnosis not present

## 2021-09-15 DIAGNOSIS — R69 Illness, unspecified: Secondary | ICD-10-CM | POA: Diagnosis not present

## 2021-09-15 DIAGNOSIS — F419 Anxiety disorder, unspecified: Secondary | ICD-10-CM | POA: Diagnosis not present

## 2021-09-15 DIAGNOSIS — F1520 Other stimulant dependence, uncomplicated: Secondary | ICD-10-CM | POA: Diagnosis not present

## 2021-09-15 DIAGNOSIS — F1020 Alcohol dependence, uncomplicated: Secondary | ICD-10-CM | POA: Diagnosis not present

## 2021-09-15 DIAGNOSIS — F1420 Cocaine dependence, uncomplicated: Secondary | ICD-10-CM | POA: Diagnosis not present

## 2021-09-15 DIAGNOSIS — F17200 Nicotine dependence, unspecified, uncomplicated: Secondary | ICD-10-CM | POA: Diagnosis not present

## 2021-09-15 DIAGNOSIS — E0865 Diabetes mellitus due to underlying condition with hyperglycemia: Secondary | ICD-10-CM | POA: Diagnosis not present

## 2021-09-15 DIAGNOSIS — F1120 Opioid dependence, uncomplicated: Secondary | ICD-10-CM | POA: Diagnosis not present

## 2021-09-15 DIAGNOSIS — F1320 Sedative, hypnotic or anxiolytic dependence, uncomplicated: Secondary | ICD-10-CM | POA: Diagnosis not present

## 2021-09-15 DIAGNOSIS — M4646 Discitis, unspecified, lumbar region: Secondary | ICD-10-CM | POA: Diagnosis not present

## 2021-09-17 DIAGNOSIS — F1020 Alcohol dependence, uncomplicated: Secondary | ICD-10-CM | POA: Diagnosis not present

## 2021-09-17 DIAGNOSIS — E0865 Diabetes mellitus due to underlying condition with hyperglycemia: Secondary | ICD-10-CM | POA: Diagnosis not present

## 2021-09-17 DIAGNOSIS — F1420 Cocaine dependence, uncomplicated: Secondary | ICD-10-CM | POA: Diagnosis not present

## 2021-09-17 DIAGNOSIS — F4310 Post-traumatic stress disorder, unspecified: Secondary | ICD-10-CM | POA: Diagnosis not present

## 2021-09-17 DIAGNOSIS — F329 Major depressive disorder, single episode, unspecified: Secondary | ICD-10-CM | POA: Diagnosis not present

## 2021-09-17 DIAGNOSIS — R69 Illness, unspecified: Secondary | ICD-10-CM | POA: Diagnosis not present

## 2021-09-17 DIAGNOSIS — M4646 Discitis, unspecified, lumbar region: Secondary | ICD-10-CM | POA: Diagnosis not present

## 2021-09-17 DIAGNOSIS — F1520 Other stimulant dependence, uncomplicated: Secondary | ICD-10-CM | POA: Diagnosis not present

## 2021-09-17 DIAGNOSIS — B182 Chronic viral hepatitis C: Secondary | ICD-10-CM | POA: Diagnosis not present

## 2021-09-17 DIAGNOSIS — F17200 Nicotine dependence, unspecified, uncomplicated: Secondary | ICD-10-CM | POA: Diagnosis not present

## 2021-09-17 DIAGNOSIS — F319 Bipolar disorder, unspecified: Secondary | ICD-10-CM | POA: Diagnosis not present

## 2021-09-17 DIAGNOSIS — F1320 Sedative, hypnotic or anxiolytic dependence, uncomplicated: Secondary | ICD-10-CM | POA: Diagnosis not present

## 2021-09-17 DIAGNOSIS — F419 Anxiety disorder, unspecified: Secondary | ICD-10-CM | POA: Diagnosis not present

## 2021-09-17 DIAGNOSIS — F1120 Opioid dependence, uncomplicated: Secondary | ICD-10-CM | POA: Diagnosis not present

## 2021-09-21 DIAGNOSIS — F419 Anxiety disorder, unspecified: Secondary | ICD-10-CM | POA: Diagnosis not present

## 2021-09-21 DIAGNOSIS — F1420 Cocaine dependence, uncomplicated: Secondary | ICD-10-CM | POA: Diagnosis not present

## 2021-09-21 DIAGNOSIS — M545 Low back pain, unspecified: Secondary | ICD-10-CM | POA: Diagnosis not present

## 2021-09-21 DIAGNOSIS — F329 Major depressive disorder, single episode, unspecified: Secondary | ICD-10-CM | POA: Diagnosis not present

## 2021-09-21 DIAGNOSIS — F1320 Sedative, hypnotic or anxiolytic dependence, uncomplicated: Secondary | ICD-10-CM | POA: Diagnosis not present

## 2021-09-21 DIAGNOSIS — F1520 Other stimulant dependence, uncomplicated: Secondary | ICD-10-CM | POA: Diagnosis not present

## 2021-09-21 DIAGNOSIS — M4646 Discitis, unspecified, lumbar region: Secondary | ICD-10-CM | POA: Diagnosis not present

## 2021-09-21 DIAGNOSIS — R69 Illness, unspecified: Secondary | ICD-10-CM | POA: Diagnosis not present

## 2021-09-21 DIAGNOSIS — E0865 Diabetes mellitus due to underlying condition with hyperglycemia: Secondary | ICD-10-CM | POA: Diagnosis not present

## 2021-09-21 DIAGNOSIS — F1120 Opioid dependence, uncomplicated: Secondary | ICD-10-CM | POA: Diagnosis not present

## 2021-09-21 DIAGNOSIS — F319 Bipolar disorder, unspecified: Secondary | ICD-10-CM | POA: Diagnosis not present

## 2021-09-21 DIAGNOSIS — F17200 Nicotine dependence, unspecified, uncomplicated: Secondary | ICD-10-CM | POA: Diagnosis not present

## 2021-09-21 DIAGNOSIS — F1020 Alcohol dependence, uncomplicated: Secondary | ICD-10-CM | POA: Diagnosis not present

## 2021-09-21 DIAGNOSIS — B182 Chronic viral hepatitis C: Secondary | ICD-10-CM | POA: Diagnosis not present

## 2021-09-21 DIAGNOSIS — M5416 Radiculopathy, lumbar region: Secondary | ICD-10-CM | POA: Diagnosis not present

## 2021-09-22 DIAGNOSIS — F17200 Nicotine dependence, unspecified, uncomplicated: Secondary | ICD-10-CM | POA: Diagnosis not present

## 2021-09-22 DIAGNOSIS — R69 Illness, unspecified: Secondary | ICD-10-CM | POA: Diagnosis not present

## 2021-09-22 DIAGNOSIS — F1020 Alcohol dependence, uncomplicated: Secondary | ICD-10-CM | POA: Diagnosis not present

## 2021-09-22 DIAGNOSIS — F329 Major depressive disorder, single episode, unspecified: Secondary | ICD-10-CM | POA: Diagnosis not present

## 2021-09-22 DIAGNOSIS — F1420 Cocaine dependence, uncomplicated: Secondary | ICD-10-CM | POA: Diagnosis not present

## 2021-09-22 DIAGNOSIS — M4646 Discitis, unspecified, lumbar region: Secondary | ICD-10-CM | POA: Diagnosis not present

## 2021-09-22 DIAGNOSIS — F1320 Sedative, hypnotic or anxiolytic dependence, uncomplicated: Secondary | ICD-10-CM | POA: Diagnosis not present

## 2021-09-22 DIAGNOSIS — F419 Anxiety disorder, unspecified: Secondary | ICD-10-CM | POA: Diagnosis not present

## 2021-09-22 DIAGNOSIS — F1520 Other stimulant dependence, uncomplicated: Secondary | ICD-10-CM | POA: Diagnosis not present

## 2021-09-22 DIAGNOSIS — F1120 Opioid dependence, uncomplicated: Secondary | ICD-10-CM | POA: Diagnosis not present

## 2021-09-22 DIAGNOSIS — E0865 Diabetes mellitus due to underlying condition with hyperglycemia: Secondary | ICD-10-CM | POA: Diagnosis not present

## 2021-09-22 DIAGNOSIS — F319 Bipolar disorder, unspecified: Secondary | ICD-10-CM | POA: Diagnosis not present

## 2021-09-22 DIAGNOSIS — B182 Chronic viral hepatitis C: Secondary | ICD-10-CM | POA: Diagnosis not present

## 2021-09-24 DIAGNOSIS — F1120 Opioid dependence, uncomplicated: Secondary | ICD-10-CM | POA: Diagnosis not present

## 2021-09-24 DIAGNOSIS — B182 Chronic viral hepatitis C: Secondary | ICD-10-CM | POA: Diagnosis not present

## 2021-09-24 DIAGNOSIS — F319 Bipolar disorder, unspecified: Secondary | ICD-10-CM | POA: Diagnosis not present

## 2021-09-24 DIAGNOSIS — R69 Illness, unspecified: Secondary | ICD-10-CM | POA: Diagnosis not present

## 2021-09-24 DIAGNOSIS — F329 Major depressive disorder, single episode, unspecified: Secondary | ICD-10-CM | POA: Diagnosis not present

## 2021-09-24 DIAGNOSIS — F1320 Sedative, hypnotic or anxiolytic dependence, uncomplicated: Secondary | ICD-10-CM | POA: Diagnosis not present

## 2021-09-24 DIAGNOSIS — F419 Anxiety disorder, unspecified: Secondary | ICD-10-CM | POA: Diagnosis not present

## 2021-09-24 DIAGNOSIS — M4646 Discitis, unspecified, lumbar region: Secondary | ICD-10-CM | POA: Diagnosis not present

## 2021-09-24 DIAGNOSIS — F1420 Cocaine dependence, uncomplicated: Secondary | ICD-10-CM | POA: Diagnosis not present

## 2021-09-24 DIAGNOSIS — E0865 Diabetes mellitus due to underlying condition with hyperglycemia: Secondary | ICD-10-CM | POA: Diagnosis not present

## 2021-09-24 DIAGNOSIS — F1520 Other stimulant dependence, uncomplicated: Secondary | ICD-10-CM | POA: Diagnosis not present

## 2021-09-24 DIAGNOSIS — F17200 Nicotine dependence, unspecified, uncomplicated: Secondary | ICD-10-CM | POA: Diagnosis not present

## 2021-09-24 DIAGNOSIS — F1020 Alcohol dependence, uncomplicated: Secondary | ICD-10-CM | POA: Diagnosis not present

## 2021-09-24 DIAGNOSIS — F4310 Post-traumatic stress disorder, unspecified: Secondary | ICD-10-CM | POA: Diagnosis not present

## 2021-09-28 DIAGNOSIS — F1420 Cocaine dependence, uncomplicated: Secondary | ICD-10-CM | POA: Diagnosis not present

## 2021-09-28 DIAGNOSIS — R69 Illness, unspecified: Secondary | ICD-10-CM | POA: Diagnosis not present

## 2021-09-28 DIAGNOSIS — F1120 Opioid dependence, uncomplicated: Secondary | ICD-10-CM | POA: Diagnosis not present

## 2021-09-28 DIAGNOSIS — B182 Chronic viral hepatitis C: Secondary | ICD-10-CM | POA: Diagnosis not present

## 2021-09-28 DIAGNOSIS — F1020 Alcohol dependence, uncomplicated: Secondary | ICD-10-CM | POA: Diagnosis not present

## 2021-09-28 DIAGNOSIS — F419 Anxiety disorder, unspecified: Secondary | ICD-10-CM | POA: Diagnosis not present

## 2021-09-28 DIAGNOSIS — F1520 Other stimulant dependence, uncomplicated: Secondary | ICD-10-CM | POA: Diagnosis not present

## 2021-09-28 DIAGNOSIS — F329 Major depressive disorder, single episode, unspecified: Secondary | ICD-10-CM | POA: Diagnosis not present

## 2021-09-28 DIAGNOSIS — F319 Bipolar disorder, unspecified: Secondary | ICD-10-CM | POA: Diagnosis not present

## 2021-09-28 DIAGNOSIS — F1320 Sedative, hypnotic or anxiolytic dependence, uncomplicated: Secondary | ICD-10-CM | POA: Diagnosis not present

## 2021-09-28 DIAGNOSIS — F17200 Nicotine dependence, unspecified, uncomplicated: Secondary | ICD-10-CM | POA: Diagnosis not present

## 2021-09-28 DIAGNOSIS — E0865 Diabetes mellitus due to underlying condition with hyperglycemia: Secondary | ICD-10-CM | POA: Diagnosis not present

## 2021-09-28 DIAGNOSIS — M4646 Discitis, unspecified, lumbar region: Secondary | ICD-10-CM | POA: Diagnosis not present

## 2021-09-29 DIAGNOSIS — F419 Anxiety disorder, unspecified: Secondary | ICD-10-CM | POA: Diagnosis not present

## 2021-09-29 DIAGNOSIS — E0865 Diabetes mellitus due to underlying condition with hyperglycemia: Secondary | ICD-10-CM | POA: Diagnosis not present

## 2021-09-29 DIAGNOSIS — F1420 Cocaine dependence, uncomplicated: Secondary | ICD-10-CM | POA: Diagnosis not present

## 2021-09-29 DIAGNOSIS — M4646 Discitis, unspecified, lumbar region: Secondary | ICD-10-CM | POA: Diagnosis not present

## 2021-09-29 DIAGNOSIS — F329 Major depressive disorder, single episode, unspecified: Secondary | ICD-10-CM | POA: Diagnosis not present

## 2021-09-29 DIAGNOSIS — R69 Illness, unspecified: Secondary | ICD-10-CM | POA: Diagnosis not present

## 2021-09-29 DIAGNOSIS — B182 Chronic viral hepatitis C: Secondary | ICD-10-CM | POA: Diagnosis not present

## 2021-09-29 DIAGNOSIS — F1520 Other stimulant dependence, uncomplicated: Secondary | ICD-10-CM | POA: Diagnosis not present

## 2021-09-29 DIAGNOSIS — F1320 Sedative, hypnotic or anxiolytic dependence, uncomplicated: Secondary | ICD-10-CM | POA: Diagnosis not present

## 2021-09-29 DIAGNOSIS — F17200 Nicotine dependence, unspecified, uncomplicated: Secondary | ICD-10-CM | POA: Diagnosis not present

## 2021-09-29 DIAGNOSIS — F1120 Opioid dependence, uncomplicated: Secondary | ICD-10-CM | POA: Diagnosis not present

## 2021-09-29 DIAGNOSIS — F1020 Alcohol dependence, uncomplicated: Secondary | ICD-10-CM | POA: Diagnosis not present

## 2021-09-29 DIAGNOSIS — F319 Bipolar disorder, unspecified: Secondary | ICD-10-CM | POA: Diagnosis not present

## 2021-10-08 ENCOUNTER — Emergency Department (HOSPITAL_COMMUNITY): Payer: 59

## 2021-10-08 ENCOUNTER — Inpatient Hospital Stay (HOSPITAL_COMMUNITY)
Admission: EM | Admit: 2021-10-08 | Discharge: 2021-10-11 | DRG: 095 | Discharge status: Against Medical Advice | Payer: 59 | Attending: Internal Medicine | Admitting: Internal Medicine

## 2021-10-08 ENCOUNTER — Other Ambulatory Visit: Payer: Self-pay

## 2021-10-08 ENCOUNTER — Encounter (HOSPITAL_COMMUNITY): Payer: Self-pay

## 2021-10-08 DIAGNOSIS — F319 Bipolar disorder, unspecified: Secondary | ICD-10-CM | POA: Diagnosis not present

## 2021-10-08 DIAGNOSIS — M4627 Osteomyelitis of vertebra, lumbosacral region: Secondary | ICD-10-CM | POA: Diagnosis not present

## 2021-10-08 DIAGNOSIS — J449 Chronic obstructive pulmonary disease, unspecified: Secondary | ICD-10-CM | POA: Diagnosis not present

## 2021-10-08 DIAGNOSIS — R32 Unspecified urinary incontinence: Secondary | ICD-10-CM | POA: Diagnosis present

## 2021-10-08 DIAGNOSIS — F1120 Opioid dependence, uncomplicated: Secondary | ICD-10-CM | POA: Diagnosis not present

## 2021-10-08 DIAGNOSIS — Z811 Family history of alcohol abuse and dependence: Secondary | ICD-10-CM

## 2021-10-08 DIAGNOSIS — Z7984 Long term (current) use of oral hypoglycemic drugs: Secondary | ICD-10-CM

## 2021-10-08 DIAGNOSIS — B182 Chronic viral hepatitis C: Secondary | ICD-10-CM | POA: Diagnosis present

## 2021-10-08 DIAGNOSIS — T380X5A Adverse effect of glucocorticoids and synthetic analogues, initial encounter: Secondary | ICD-10-CM | POA: Diagnosis not present

## 2021-10-08 DIAGNOSIS — Z9151 Personal history of suicidal behavior: Secondary | ICD-10-CM | POA: Diagnosis not present

## 2021-10-08 DIAGNOSIS — G062 Extradural and subdural abscess, unspecified: Secondary | ICD-10-CM | POA: Diagnosis not present

## 2021-10-08 DIAGNOSIS — Z794 Long term (current) use of insulin: Secondary | ICD-10-CM | POA: Diagnosis not present

## 2021-10-08 DIAGNOSIS — M4647 Discitis, unspecified, lumbosacral region: Secondary | ICD-10-CM | POA: Diagnosis present

## 2021-10-08 DIAGNOSIS — M4626 Osteomyelitis of vertebra, lumbar region: Secondary | ICD-10-CM | POA: Diagnosis not present

## 2021-10-08 DIAGNOSIS — F17210 Nicotine dependence, cigarettes, uncomplicated: Secondary | ICD-10-CM | POA: Diagnosis present

## 2021-10-08 DIAGNOSIS — R69 Illness, unspecified: Secondary | ICD-10-CM | POA: Diagnosis not present

## 2021-10-08 DIAGNOSIS — E119 Type 2 diabetes mellitus without complications: Secondary | ICD-10-CM | POA: Diagnosis not present

## 2021-10-08 DIAGNOSIS — K219 Gastro-esophageal reflux disease without esophagitis: Secondary | ICD-10-CM | POA: Diagnosis present

## 2021-10-08 DIAGNOSIS — E1165 Type 2 diabetes mellitus with hyperglycemia: Secondary | ICD-10-CM | POA: Diagnosis not present

## 2021-10-08 DIAGNOSIS — F4310 Post-traumatic stress disorder, unspecified: Secondary | ICD-10-CM | POA: Diagnosis not present

## 2021-10-08 DIAGNOSIS — M4807 Spinal stenosis, lumbosacral region: Secondary | ICD-10-CM | POA: Diagnosis present

## 2021-10-08 DIAGNOSIS — Z833 Family history of diabetes mellitus: Secondary | ICD-10-CM

## 2021-10-08 DIAGNOSIS — M4646 Discitis, unspecified, lumbar region: Secondary | ICD-10-CM | POA: Diagnosis not present

## 2021-10-08 DIAGNOSIS — Z79899 Other long term (current) drug therapy: Secondary | ICD-10-CM

## 2021-10-08 DIAGNOSIS — F332 Major depressive disorder, recurrent severe without psychotic features: Secondary | ICD-10-CM | POA: Diagnosis present

## 2021-10-08 DIAGNOSIS — I10 Essential (primary) hypertension: Secondary | ICD-10-CM | POA: Diagnosis not present

## 2021-10-08 DIAGNOSIS — M5126 Other intervertebral disc displacement, lumbar region: Secondary | ICD-10-CM | POA: Diagnosis not present

## 2021-10-08 HISTORY — DX: Extradural and subdural abscess, unspecified: G06.2

## 2021-10-08 LAB — RAPID URINE DRUG SCREEN, HOSP PERFORMED
Amphetamines: NOT DETECTED
Barbiturates: NOT DETECTED
Benzodiazepines: NOT DETECTED
Cocaine: NOT DETECTED
Opiates: NOT DETECTED
Tetrahydrocannabinol: POSITIVE — AB

## 2021-10-08 LAB — COMPREHENSIVE METABOLIC PANEL
ALT: 34 U/L (ref 0–44)
AST: 23 U/L (ref 15–41)
Albumin: 3.5 g/dL (ref 3.5–5.0)
Alkaline Phosphatase: 42 U/L (ref 38–126)
Anion gap: 9 (ref 5–15)
BUN: 9 mg/dL (ref 6–20)
CO2: 24 mmol/L (ref 22–32)
Calcium: 8.8 mg/dL — ABNORMAL LOW (ref 8.9–10.3)
Chloride: 105 mmol/L (ref 98–111)
Creatinine, Ser: 0.85 mg/dL (ref 0.61–1.24)
GFR, Estimated: >60 mL/min (ref >60)
Glucose, Bld: 229 mg/dL — ABNORMAL HIGH (ref 70–99)
Potassium: 3.6 mmol/L (ref 3.5–5.1)
Sodium: 138 mmol/L (ref 135–145)
Total Bilirubin: 0.8 mg/dL (ref 0.3–1.2)
Total Protein: 7.5 g/dL (ref 6.5–8.1)

## 2021-10-08 LAB — CBC WITH DIFFERENTIAL/PLATELET
Abs Immature Granulocytes: 0.01 10*3/uL (ref 0.00–0.07)
Basophils Absolute: 0 10*3/uL (ref 0.0–0.1)
Basophils Relative: 0 %
Eosinophils Absolute: 0.1 10*3/uL (ref 0.0–0.5)
Eosinophils Relative: 1 %
HCT: 42.4 % (ref 39.0–52.0)
Hemoglobin: 13.7 g/dL (ref 13.0–17.0)
Immature Granulocytes: 0 %
Lymphocytes Relative: 21 %
Lymphs Abs: 1 10*3/uL (ref 0.7–4.0)
MCH: 30.6 pg (ref 26.0–34.0)
MCHC: 32.3 g/dL (ref 30.0–36.0)
MCV: 94.6 fL (ref 80.0–100.0)
Monocytes Absolute: 0.4 10*3/uL (ref 0.1–1.0)
Monocytes Relative: 7 %
Neutro Abs: 3.4 10*3/uL (ref 1.7–7.7)
Neutrophils Relative %: 71 %
Platelets: 161 10*3/uL (ref 150–400)
RBC: 4.48 MIL/uL (ref 4.22–5.81)
RDW: 14.3 % (ref 11.5–15.5)
WBC: 4.8 10*3/uL (ref 4.0–10.5)
nRBC: 0 % (ref 0.0–0.2)

## 2021-10-08 LAB — URINALYSIS, ROUTINE W REFLEX MICROSCOPIC
Bilirubin Urine: NEGATIVE
Glucose, UA: 50 mg/dL — AB
Hgb urine dipstick: NEGATIVE
Ketones, ur: NEGATIVE mg/dL
Leukocytes,Ua: NEGATIVE
Nitrite: NEGATIVE
Protein, ur: NEGATIVE mg/dL
Specific Gravity, Urine: 1.011 (ref 1.005–1.030)
pH: 6 (ref 5.0–8.0)

## 2021-10-08 LAB — SEDIMENTATION RATE: Sed Rate: 53 mm/hr — ABNORMAL HIGH (ref 0–16)

## 2021-10-08 MED ORDER — DIPHENHYDRAMINE HCL 50 MG/ML IJ SOLN
25.0000 mg | Freq: Once | INTRAMUSCULAR | Status: AC
  Administered 2021-10-08: 25 mg via INTRAVENOUS
  Filled 2021-10-08: qty 1

## 2021-10-08 MED ORDER — INSULIN ASPART 100 UNIT/ML IJ SOLN: 0.0000 [IU] | Freq: Three times a day (TID) | INTRAMUSCULAR | Status: DC

## 2021-10-08 MED ORDER — CLONIDINE HCL 0.1 MG PO TABS
0.1000 mg | ORAL_TABLET | Freq: Four times a day (QID) | ORAL | Status: DC
  Administered 2021-10-08 – 2021-10-10 (×9): 0.1 mg via ORAL
  Filled 2021-10-08 (×9): qty 1

## 2021-10-08 MED ORDER — GADOPICLENOL 0.5 MMOL/ML IV SOLN
7.5000 mL | Freq: Once | INTRAVENOUS | Status: AC | PRN
  Administered 2021-10-08: 7.5 mL via INTRAVENOUS

## 2021-10-08 MED ORDER — VANCOMYCIN HCL IN DEXTROSE 1-5 GM/200ML-% IV SOLN: 1000.0000 mg | Freq: Two times a day (BID) | INTRAVENOUS | Status: DC

## 2021-10-08 MED ORDER — KETOROLAC TROMETHAMINE 30 MG/ML IJ SOLN
30.0000 mg | Freq: Once | INTRAMUSCULAR | Status: AC
  Administered 2021-10-08: 30 mg via INTRAVENOUS
  Filled 2021-10-08: qty 1

## 2021-10-08 MED ORDER — VANCOMYCIN HCL IN DEXTROSE 1-5 GM/200ML-% IV SOLN
1000.0000 mg | Freq: Two times a day (BID) | INTRAVENOUS | Status: DC
  Administered 2021-10-09: 1000 mg via INTRAVENOUS
  Filled 2021-10-08: qty 200

## 2021-10-08 MED ORDER — ONDANSETRON HCL 4 MG/2ML IJ SOLN: 4.0000 mg | Freq: Four times a day (QID) | INTRAMUSCULAR | Status: DC | PRN

## 2021-10-08 MED ORDER — ONDANSETRON HCL 4 MG PO TABS: 4.0000 mg | ORAL_TABLET | Freq: Four times a day (QID) | ORAL | Status: DC | PRN

## 2021-10-08 MED ORDER — ROPINIROLE HCL 1 MG PO TABS
0.5000 mg | ORAL_TABLET | Freq: Every day | ORAL | Status: DC
  Administered 2021-10-08 – 2021-10-10 (×3): 0.5 mg via ORAL
  Filled 2021-10-08: qty 2
  Filled 2021-10-08 (×2): qty 1

## 2021-10-08 MED ORDER — POLYETHYLENE GLYCOL 3350 17 G PO PACK: 17.0000 g | PACK | Freq: Every day | ORAL | Status: DC | PRN

## 2021-10-08 MED ORDER — IBUPROFEN 200 MG PO TABS: 600.0000 mg | ORAL_TABLET | Freq: Four times a day (QID) | ORAL | Status: DC | PRN

## 2021-10-08 MED ORDER — MELATONIN 3 MG PO TABS
6.0000 mg | ORAL_TABLET | Freq: Every day | ORAL | Status: DC
  Administered 2021-10-08 – 2021-10-09 (×2): 6 mg via ORAL
  Filled 2021-10-08 (×4): qty 2

## 2021-10-08 MED ORDER — POTASSIUM CHLORIDE IN NACL 20-0.9 MEQ/L-% IV SOLN
INTRAVENOUS | Status: DC
  Filled 2021-10-08 (×2): qty 1000

## 2021-10-08 MED ORDER — VANCOMYCIN HCL 1500 MG/300ML IV SOLN
1500.0000 mg | Freq: Once | INTRAVENOUS | Status: AC
  Administered 2021-10-08: 1500 mg via INTRAVENOUS
  Filled 2021-10-08: qty 300

## 2021-10-08 MED ORDER — INSULIN ASPART 100 UNIT/ML IJ SOLN
0.0000 [IU] | Freq: Four times a day (QID) | INTRAMUSCULAR | Status: DC
  Administered 2021-10-08: 5 [IU] via SUBCUTANEOUS
  Administered 2021-10-09: 7 [IU] via SUBCUTANEOUS
  Administered 2021-10-09: 5 [IU] via SUBCUTANEOUS
  Filled 2021-10-08 (×3): qty 1

## 2021-10-08 MED ORDER — QUETIAPINE FUMARATE 100 MG PO TABS
100.0000 mg | ORAL_TABLET | Freq: Every day | ORAL | Status: DC
  Administered 2021-10-08 – 2021-10-10 (×3): 100 mg via ORAL
  Filled 2021-10-08 (×3): qty 1

## 2021-10-08 MED ORDER — METHOCARBAMOL 500 MG PO TABS
1000.0000 mg | ORAL_TABLET | Freq: Four times a day (QID) | ORAL | Status: DC | PRN
  Filled 2021-10-08: qty 2

## 2021-10-08 MED ORDER — LIDOCAINE 5 % EX PTCH
2.0000 | MEDICATED_PATCH | CUTANEOUS | Status: DC
  Administered 2021-10-08 – 2021-10-10 (×3): 2 via TRANSDERMAL
  Filled 2021-10-08 (×3): qty 2

## 2021-10-08 MED ORDER — INSULIN ASPART 100 UNIT/ML IJ SOLN: 0.0000 [IU] | Freq: Every day | INTRAMUSCULAR | Status: DC

## 2021-10-08 MED ORDER — PRAZOSIN HCL 1 MG PO CAPS
1.0000 mg | ORAL_CAPSULE | Freq: Every day | ORAL | Status: DC
  Administered 2021-10-08 – 2021-10-10 (×3): 1 mg via ORAL
  Filled 2021-10-08 (×8): qty 1

## 2021-10-08 MED ORDER — DEXAMETHASONE SODIUM PHOSPHATE 10 MG/ML IJ SOLN
10.0000 mg | Freq: Once | INTRAMUSCULAR | Status: AC
Start: 2021-10-08 — End: 2021-10-08
  Administered 2021-10-08: 10 mg via INTRAVENOUS
  Filled 2021-10-08: qty 1

## 2021-10-08 MED ORDER — CYCLOBENZAPRINE HCL 10 MG PO TABS
10.0000 mg | ORAL_TABLET | Freq: Once | ORAL | Status: AC
  Administered 2021-10-08: 10 mg via ORAL
  Filled 2021-10-08: qty 1

## 2021-10-08 NOTE — ED Provider Notes (Signed)
[facility] EMERGENCY DEPARTMENT Provider Note   CSN: [id number] Arrival date & time: 10/08/21  1252     History  Chief Complaint  Patient presents with   Back Pain    Joshua Thomas is a 46 y.o. male.  With past medical history of opioid use disorder, polysubstance abuse, hypertension, chronic back pain, diabetes who presents to the emergency department with back pain.  Patient states that he has had chronic low back pain for 5 years.  States that 5 years ago he had a "spinal infection," and was in the hospital for "10 weeks."  States that about 4 months ago he began rehab for polysubstance abuse including opioids.  States that he got out of rehab about 1 week ago.  States that he has had steadily worsening lower back pain since he has been in rehab.  He denies any falls or trauma.  Denies any fevers.  States that the pain radiates into both of his legs.  He denies saddle anesthesia.  He does feel that his left leg is weaker than his right but is not had leg dragging.  He states that he does "dribble" when he pees which is newer.  He has been using Advil, naproxen, Tylenol, Aleve and Biofreeze for the past week without improvement in symptoms.  Denies ongoing IV drug use.   Back Pain Associated symptoms: weakness   Associated symptoms: no abdominal pain, no dysuria, no fever and no headaches        Home Medications Prior to Admission medications   Medication Sig Start Date End Date Taking? Authorizing Provider  acetaminophen (TYLENOL) 500 MG tablet Take 1,000 mg by mouth every 6 (six) hours as needed.   Yes [provider]  cloNIDine (CATAPRES) 0.1 MG tablet Take 0.1 mg by mouth 4 (four) times daily. 09/17/21  Yes [provider]  DULoxetine (CYMBALTA) 30 MG capsule Take 30 mg by mouth every morning. 8 am 09/10/21  Yes [provider]  gabapentin (NEURONTIN) 800 MG tablet Take 1 tablet (800 mg total) by mouth 2 (two) times daily for 3 days. 07/08/21 10/08/21  Yes Delene Ruffini, MD  ibuprofen (ADVIL) 100 MG tablet Take 200 mg by mouth every 6 (six) hours as needed for fever.   Yes [provider]  Melatonin 10 MG TABS Take 10 mg by mouth at bedtime. 9pm   Yes [provider]  metFORMIN (GLUCOPHAGE-XR) 750 MG 24 hr tablet SMARTSIG:1 Tablet(s) By Mouth Every Evening 08/05/21  Yes [provider]  methocarbamol (ROBAXIN) 500 MG tablet Take 1,000 mg by mouth every 6 (six) hours as needed for muscle spasms. 09/14/21  Yes [provider]  naloxone [facility]) nasal spray 4 mg/0.1 mL Take in case of overdose 08/07/20  Yes Long, Wonda Olds, MD  naproxen sodium (ALEVE) 220 MG tablet Take 220 mg by mouth daily as needed (pain).   Yes [provider]  prazosin (MINIPRESS) 1 MG capsule Take 1 mg by mouth at bedtime. 07/31/21  Yes [provider]  QUEtiapine (SEROQUEL) 100 MG tablet Take 100 mg by mouth at bedtime.   Yes [provider]  rOPINIRole (REQUIP) 0.5 MG tablet Take 0.5 mg by mouth at bedtime. 08/05/21  Yes [provider]  Calcium Carbonate Antacid (TUMS PO) Take 2-3 tablets by mouth as needed (gerd).    [provider]  ciprofloxacin (CIPRO) 750 MG tablet Take 750 mg by mouth 2 (two) times daily.    [provider]  diclofenac  Sodium (VOLTAREN) 1 % GEL Apply 4 g topically 4 (four) times daily. Apply to affected areas 4 times daily as needed for pain. Patient not taking: Reported on 06/12/2021 04/26/21   Lynden Oxford Scales, PA-C  Insulin Aspart FlexPen (NOVOLOG) 100 UNIT/ML Inject into the skin. 07/24/21   [provider]  lidocaine (LIDODERM) 5 % Place 1 patch onto the skin daily. Remove & Discard patch within 12 hours or as directed by MD 07/08/21   Delene Ruffini, MD  linezolid (ZYVOX) 600 MG tablet Take 600 mg by mouth 2 (two) times daily. Patient not taking: Reported on 10/08/2021    [provider]  thiamine (VITAMIN B1) 100 MG tablet Take 100 mg by  mouth every morning. 07/24/21   [provider]      Allergies    Morphine and Tylenol [acetaminophen]    Review of Systems   Review of Systems  Constitutional:  Negative for fever.  Gastrointestinal:  Negative for abdominal pain.  Genitourinary:  Negative for dysuria.  Musculoskeletal:  Positive for back pain and gait problem. Negative for neck pain.  Neurological:  Positive for weakness. Negative for headaches.  All other systems reviewed and are negative.   Physical Exam Updated Vital Signs BP (!) 146/119 (BP Location: Right Arm)   Pulse 90   Temp 98.2 F (36.8 C) (Oral)   Resp 18   Ht 5\' 3"  (1.6 m)   Wt 74.8 kg   SpO2 100%   BMI 29.23 kg/m  Physical Exam Vitals and nursing note reviewed.  Constitutional:      General: He is not in acute distress.    Appearance: Normal appearance. He is ill-appearing. He is not toxic-appearing.  HENT:     Head: Normocephalic.     Mouth/Throat:     Mouth: Mucous membranes are moist.  Eyes:     General: No scleral icterus.    Extraocular Movements: Extraocular movements intact.  Cardiovascular:     Rate and Rhythm: Normal rate and regular rhythm.     Pulses: Normal pulses.     Heart sounds: No murmur heard. Pulmonary:     Effort: Pulmonary effort is normal. No respiratory distress.     Breath sounds: Normal breath sounds.  Abdominal:     General: Bowel sounds are normal.     Palpations: Abdomen is soft.     Tenderness: There is no abdominal tenderness.  Musculoskeletal:     Cervical back: Neck supple. No tenderness or bony tenderness.     Thoracic back: No bony tenderness.     Lumbar back: Bony tenderness present. No swelling, edema, deformity or signs of trauma. Decreased range of motion. Positive left straight leg raise test. Negative right straight leg raise test.     Comments: Strength 4/5 in left lower leg, 5/5 in right lower leg.  Sensation intact bilaterally.  Skin:    General: Skin is warm and dry.      Capillary Refill: Capillary refill takes less than 2 seconds.  Neurological:     General: No focal deficit present.     Mental Status: He is alert and oriented to person, place, and time.     Sensory: No sensory deficit.  Psychiatric:        Mood and Affect: Mood normal.        Behavior: Behavior normal.        Thought Content: Thought content normal.        Judgment: Judgment normal.    ED  Results / Procedures / Treatments   Labs (all labs ordered are listed, but only abnormal results are displayed) Labs Reviewed  COMPREHENSIVE METABOLIC PANEL - Abnormal; Notable for the following components:      Result Value   Glucose, Bld 229 (*)    Calcium 8.8 (*)    All other components within normal limits  URINALYSIS, ROUTINE W REFLEX MICROSCOPIC - Abnormal; Notable for the following components:   Color, Urine STRAW (*)    Glucose, UA 50 (*)    All other components within normal limits  CBC WITH DIFFERENTIAL/PLATELET  RAPID URINE DRUG SCREEN, HOSP PERFORMED   EKG None  Radiology MR Lumbar Spine W Wo Contrast (assess for abscess, cord compression)  Result Date: 10/08/2021 CLINICAL DATA:  Provided history: Low back pain, infection suspected, positive x-ray/CT. EXAM: MRI LUMBAR SPINE WITHOUT AND WITH CONTRAST TECHNIQUE: Multiplanar and multiecho pulse sequences of the lumbar spine were obtained without and with intravenous contrast. CONTRAST:  7.5 mL Vueway intravenous contrast. COMPARISON:  Lumbar spine MRI 07/04/2021. Lumbar spine radiographs 06/10/2021. FINDINGS: Segmentation: 5 lumbar vertebrae. The caudal most well-formed intervertebral disc space is designated L5-S1. Alignment: Dextrocurvature of the lumbar spine. 3 mm L1-L2 grade 1 retrolisthesis. Vertebrae: Persistent prominent marrow edema and enhancement within the L5 vertebral body, within the S1 body and extending into the left greater than right sacral alae. L5-S1 endplate erosion has progressed since the prior MRI of 07/04/2021.  Progressive abnormal ventral epidural enhancement at this level, now measuring 8 mm in AP dimension (previously measuring 5 mm). There are small foci of hypoenhancement centrally, and this likely reflects epidural phlegmon with superimposed small epidural abscesses. Persistent abnormal enhancement ventral to the L5-S1 disc space and L5 vertebral body, now measuring 6 mm in AP dimension. A small focus of internal hypoenhancement is present near the level of the disc space, in this likely reflects prevertebral phlegmon with a superimposed small abscess (series 10, image 5). Persistent more generalized paraspinal inflammation centered about the L5-S1 disc space with abnormal enhancement extending into the bilateral neural foramina. Redemonstrated sequelae of remote discitis/osteomyelitis at L2-L3 with fatty marrow conversion. Minimal endplate edema and enhancement at this level, similar to the prior examination and likely degenerative. Patchy fatty marrow conversion elsewhere within the lumbar and visualized lower thoracic spine. Unchanged chronic L1 superior endplate vertebral compression fracture with superimposed Schmorl node. Multilevel ventrolateral osteophytes. Conus medullaris and cauda equina: Conus extends to the L2 level. No signal abnormality identified within the visualized distal spinal cord. No pathologic enhancement of the visualized distal spinal cord or cauda equina nerve roots. Paraspinal and other soft tissues: Paraspinal inflammation centered at the L5-S1 level, as described above. No acute abnormality identified within included portions of the abdomen/retroperitoneum. Disc levels: Unless otherwise stated, the level by level findings below have not significantly changed from the prior MRI of 07/04/2021. Multilevel disc height loss, greatest at T11-T12 (moderate), T12-L1 (moderate), L1-L2 (moderate), L2-L3 (severe) and L5-S1 (severe). T12-L1: No significant spinal canal or foraminal stenosis. L1-L2:  Grade 1 retrolisthesis. Disc bulge asymmetric to the left with endplate osteophytes. Superimposed small central disc protrusion. A shallow broad-based left subarticular/foraminal disc protrusion is also present. Facet arthrosis. The central disc protrusion results in mild relative narrowing of the central canal (without spinal cord mass effect). The left subarticular/foraminal disc protrusion contributes to mild left neural foraminal narrowing and contacts the undersurface of the exiting left L1 nerve root. L2-L3: Disc collapse with endplate remodeling. Facet arthrosis. Mild effacement of the ventral thecal  sac (without significant subarticular or central canal stenosis). Mild relative bilateral neural foraminal narrowing L3-L4: Disc bulge. Superimposed broad-based left subarticular/foraminal disc protrusion with associated osteophyte ridge. Moderate facet arthrosis with ligamentum flavum hypertrophy. Minimal relative left subarticular narrowing (without nerve root impingement). Central canal patent. The disc protrusion contributes to mild-to-moderate left neural foraminal narrowing, and contacts the undersurface of the exiting left L3 nerve root. L4-L5: Slight disc bulge. Moderate facet arthrosis v with oice ligamentum flavum hypertrophy. No significant spinal canal stenosis. Mild relative bilateral neural foraminal narrowing. L5-S1: Disc bulging with endplate spurring and facet arthrosis. Sequelae of discitis/osteomyelitis, as described above. Progressive signal abnormality within the left center to left extraforaminal zones, likely reflecting a combination of herniated disc and epidural phlegmon/abscess, as well as paraspinal phlegmon. Resultant left subarticular stenosis with posterior displacement of the descending left S1 nerve root. No significant central canal stenosis. Severe left neural foraminal narrowing. These results will be called to the ordering clinician or representative by the Radiologist  Assistant, and communication documented in the PACS or [employer]. IMPRESSION: Findings of persistent active discitis/osteomyelitis at L5-S1, as detailed. Endplate erosion has progressed. Progressive ventral epidural enhancement, likely reflecting epidural phlegmon with superimposed small epidural abscesses. Prevertebral phlegmon and superimposed small prevertebral abscess also present at this level. Progressive multifactorial L5-S1 left subarticular stenosis with posterior displacement of the descending left S1 nerve root. Progressive multifactorial severe left neural foraminal narrowing. Redemonstrated sequelae of remote L2-L3 discitis/osteomyelitis. No convincing MR evidence of active infection at this level at this time. Electronically Signed   By: Kellie Simmering D.O.   On: 10/08/2021 15:42    Procedures Procedures   Medications Ordered in ED Medications  vancomycin (VANCOREADY) IVPB 1500 mg/300 mL (1,500 mg Intravenous New Bag/Given 10/08/21 1615)  vancomycin (VANCOCIN) IVPB 1000 mg/200 mL premix (has no administration in time range)  dexamethasone (DECADRON) injection 10 mg (10 mg Intravenous Given 10/08/21 1425)  cyclobenzaprine (FLEXERIL) tablet 10 mg (10 mg Oral Given 10/08/21 1425)  diphenhydrAMINE (BENADRYL) injection 25 mg (25 mg Intravenous Given 10/08/21 1427)  gadopiclenol (VUEWAY) 0.5 MMOL/ML solution 7.5 mL (7.5 mLs Intravenous Contrast Given 10/08/21 1458)    ED Course/ Medical Decision Making/ A&P                           Medical Decision Making Amount and/or Complexity of Data Reviewed Labs: ordered. Radiology: ordered.  Risk Prescription drug management. Decision regarding hospitalization.   This patient presents to the ED with chief complaint(s) of low back pain with pertinent past medical history of polysubstance abuse, hypertension, diabetes, chronic low back pain which further complicates the presenting complaint. The complaint involves an extensive differential  diagnosis and also carries with it a high risk of complications and morbidity.    The differential diagnosis includes Fracture, subluxation, musculoskeletal strain, epidural abscess, cauda equina, muscle spasm, sciatica or radiculopathy, etc.     Additional history obtained: Additional history obtained from  none available Records reviewed Care Everywhere/External Records and Primary Care Documents  ED Course and Reassessment: 46 year old male who presents to the emergency department with low back pain in the setting of previous discitis, osteomyelitis and IV drug use. On physical exam he has point tenderness over the lumbar spine without any erythema, edema, bruising or rash. Adamantly denies new IV drug use since being out of rehab. Obtaining basic labs including a urine as well as MR with and without to assess for epidural abscess.  Given his  previous opioid dependence, giving Decadron, Flexeril to help with control of pain and symptoms. Basic labs relatively unremarkable.  MR spine shows discitis/osteomyelitis as well as epidural phlegmon, epidural abscess.  Consulted and spoke with neurosurgery, Dr. Arnoldo Morale who recommends hospitalist admission with IV antibiotics and will follow along.  IV vancomycin was already started.  Consulted and spoke with Dr. Denton Brick, hospitalist who agrees to admit patient. Patient is agreeable to plan.   Independent labs interpretation:  The following labs were independently interpreted: No leukocytosis, slightly elevated glucose  Independent visualization of imaging: - I independently visualized the following imaging with scope of interpretation limited to determining acute life threatening conditions related to emergency care: MRI with and without of the lumbar spine, which revealed  IMPRESSION:  Findings of persistent active discitis/osteomyelitis at L5-S1, as  detailed. Endplate erosion has progressed. Progressive ventral  epidural enhancement, likely  reflecting epidural phlegmon with  superimposed small epidural abscesses. Prevertebral phlegmon and  superimposed small prevertebral abscess also present at this level.  Progressive multifactorial L5-S1 left subarticular stenosis with  posterior displacement of the descending left S1 nerve root.  Progressive multifactorial severe left neural foraminal narrowing.    Redemonstrated sequelae of remote L2-L3 discitis/osteomyelitis. No  convincing MR evidence of active infection at this level at this  time.   Consultation: - Consulted or discussed management/test interpretation w/ external professional: Dr. Arnoldo Morale, neurosurgery who recommends hospitalist admission, will follow along. Spoke with Dr. Denton Brick, hospitalist who agrees to admit the patient.   Consideration for admission or further workup: admission for IV antibiotics  Social Determinants of health: Substance abuse Final Clinical Impression(s) / ED Diagnoses Final diagnoses:  Epidural abscess    Rx / DC Orders ED Discharge Orders     None         Mickie Hillier, PA-C 10/08/21 1813    Kommor, Debe Coder, MD 10/08/21 2205

## 2021-10-08 NOTE — Assessment & Plan Note (Signed)
-   HgbA1c -Hold metformin - SSI- S 

## 2021-10-08 NOTE — ED Notes (Signed)
CBG 300. 

## 2021-10-08 NOTE — ED Notes (Signed)
Provider at bedside

## 2021-10-08 NOTE — H&P (Addendum)
History and Physical    Joshua Thomas XLK:[id number] DOB: 10-04-1975 DOA: 10/08/2021  PCP: Pcp, No   Patient coming from: Home  I have personally briefly reviewed patient's old medical records in [place]  Chief Complaint: Back Pain  HPI: Joshua Thomas is a 46 y.o. male with medical history significant for lumbar osteomyelitis, and opioid use disorder, IV drug use (fentanyl).   He presented to the ED with complaints of increasing low back pain over the past 3 months.  Patient was admitted to the hospital 6/23 to 6/28, for acute L5-S1 discitis/osteomyelitis, IR attempted aspiration but this was not an effective sampling and cultures did not show any growth.  ID recommended oral linezolid and oral Cipro to complete 6 weeks.  Plan was for patient to taper down and stop all opiates and start Suboxone and follow-up appointment, follow-up with ID as outpatient which he did.  He reports that he complete the course of the antibiotics. Patient went to [facility] for rehab, discharged 1 week ago. Marland Kitchen  He has had persistent and worsening back pain since hospitalization, reports weakness of his left leg compared to his right.  Also reports some dribbling when urinating both of these are new.  He insists he has not used any IV drugs 3 months, since he was discharged from the hospital in June.  He is on Vivitrol injections for control of opioid abuse.  ED Course: Afebrile temperature 98.2.  Stable vitals.  WBC 4.8.  MRI of the back shows active discitis, osteomyelitis of L5-S1, endplate erosion has progressed, also epidural phlegmon with superimposed small epidural abscesses.  Also demonstrates remote L2-L3 discitis/osteomyelitis that is not convincing for active infection. EDP talked to neurosurgery on-call, NP- Reinaldo Meeker, commended admission to [facility] under hospitalist service. IV vancomycin started.  Decadron 10 mg given.  Review of Systems: As per HPI all other systems reviewed and  negative.  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   COPD (chronic obstructive pulmonary disease) (HCC)    DDD (degenerative disc disease), lumbar    Degenerative disc disease, lumbar    Depression    Discitis    GERD (gastroesophageal reflux disease)    Hepatitis C    Hepatitis C    History of stomach ulcers    Hx of diabetes mellitus    due to infection   Hypertension    Lung nodules    3 on L and 2 on R   PTSD (post-traumatic stress disorder)    Sciatica    Substance abuse ([place])     Past Surgical History:  Procedure Laterality Date   FRACTURE SURGERY     HARDWARE REMOVAL  01/27/2012   Procedure: HARDWARE REMOVAL;  Surgeon: Mcarthur Rossetti, MD;  Location: [facility];  Service: Orthopedics;  Laterality: Left;   IR LUMBAR DISC ASPIRATION W/IMG GUIDE  06/08/2017   IR LUMBAR DISC ASPIRATION W/IMG GUIDE  07/06/2021   RADIOLOGY WITH ANESTHESIA N/A 06/08/2017   Procedure: DISC ASPIRATION;  Surgeon: Luanne Bras, MD;  Location: [facility];  Service: Radiology;  Laterality: N/A;   TIBIA IM NAIL INSERTION  01/27/2012   Procedure: INTRAMEDULLARY (IM) NAIL TIBIAL;  Surgeon: Mcarthur Rossetti, MD;  Location: [facility];  Service: Orthopedics;  Laterality: Left;  Removal of IM Rod and Screws Left Tibia with Exchange Nail, Allograft Bone Graft Left Tibia     reports that he has been smoking cigarettes. He has a 33.00 pack-year smoking history. He quit smokeless tobacco use about 15 years ago. He reports that he does not currently use alcohol after a past usage of about 1.0 standard drink of alcohol per week. He reports current drug use. Drugs: IV, Fentanyl, and Benzodiazepines.  Allergies  Allergen Reactions   Morphine Itching   Tylenol [Acetaminophen] Other (See Comments)    Due to  Ulcers/ Liver Damage    Family History  Problem Relation Age of Onset   Diabetes Mother    Alcohol abuse Father    Cirrhosis Father    Aneurysm Father     Prior to Admission medications   Medication Sig Start Date End Date Taking? Authorizing Provider  Calcium Carbonate Antacid (TUMS PO) Take 2-3 tablets by mouth as needed (gerd).    [provider]  ciprofloxacin (CIPRO) 750 MG tablet Take 750 mg by mouth 2 (two) times daily.    [provider]  diclofenac Sodium (VOLTAREN) 1 % GEL Apply 4 g topically 4 (four) times daily. Apply to affected areas 4 times daily as needed for pain. Patient not taking: Reported on 06/12/2021 04/26/21   Lynden Oxford Scales, PA-C  gabapentin (NEURONTIN) 800 MG tablet Take 1 tablet (800 mg total) by mouth 2 (two) times daily for 3 days. 07/08/21 07/11/21  Delene Ruffini, MD  Insulin Aspart FlexPen (NOVOLOG) 100 UNIT/ML Inject into the skin. 07/24/21   [provider]  lidocaine (LIDODERM) 5 % Place 1 patch onto the skin daily. Remove & Discard patch within 12 hours or as directed by MD 07/08/21   Delene Ruffini, MD  linezolid (ZYVOX) 600 MG tablet Take 600 mg by mouth 2 (two) times daily.    [provider]  metFORMIN (GLUCOPHAGE-XR) 750 MG 24 hr tablet SMARTSIG:1 Tablet(s) By Mouth Every Evening 08/05/21   [provider]  naloxone [facility]) nasal spray 4 mg/0.1 mL Take in case of overdose Patient not taking: Reported on 08/11/2021 08/07/20   Long, Wonda Olds, MD  omeprazole (PRILOSEC) 20 MG capsule SMARTSIG:1 Capsule(s) By Mouth Morning-Evening 08/03/21   [provider]  prazosin (MINIPRESS) 1 MG capsule Take 1 mg by mouth at bedtime. 07/31/21   [provider]  Psyllium (METAMUCIL) WAFR SMARTSIG:2 wafer By Mouth Every Morning 07/27/21   [provider]  QUEtiapine (SEROQUEL) 100 MG tablet Take 100 mg by mouth at bedtime.    [provider]  rOPINIRole (REQUIP) 0.5 MG tablet Take 0.5 mg  by mouth at bedtime. 08/05/21   [provider]  sertraline (ZOLOFT) 100 MG tablet  12/16/20   [provider]  sertraline (ZOLOFT) 25 MG tablet Take 25 mg by mouth every morning. 08/05/21   [provider]  thiamine (VITAMIN B1) 100 MG tablet Take 100 mg by mouth every morning. 07/24/21   [provider]    Physical Exam: Vitals:   10/08/21 1256 10/08/21 1258 10/08/21 1430  BP:  (!) 146/119   Pulse:  90   Resp:  19 18  Temp:  98.2 F (36.8 C)   TempSrc:  Oral   SpO2:  100%   Weight: 74.8 kg    Height: 5' 3"  (1.6 m)      Constitutional: older than stated age, appears uncomfortable, standing due to back pain Vitals:   10/08/21 1256 10/08/21  1258 10/08/21 1430  BP:  (!) 146/119   Pulse:  90   Resp:  19 18  Temp:  98.2 F (36.8 C)   TempSrc:  Oral   SpO2:  100%   Weight: 74.8 kg    Height: 5' 3"  (1.6 m)     Eyes: PERRL, lids and conjunctivae normal ENMT: Mucous membranes are moist.  Neck: normal, supple, no masses, no thyromegaly Respiratory: clear to auscultation bilaterally, no wheezing, no crackles.  Cardiovascular: Regular rate and rhythm, no murmurs / rubs / gallops. No extremity edema. Abdomen: no tenderness, no masses palpated. No hepatosplenomegaly. Bowel sounds positive.  Musculoskeletal: no clubbing / cyanosis. No joint deformity upper and lower extremities.  Skin: no rashes, lesions, ulcers. No induration Neurologic: No facial asymmetry, speech fluent without evidence of aphasia, sensation globally intact, barely able to lift left lower extremity against gravity,  Psychiatric: Normal judgment and insight. Alert and oriented x 3. Normal mood.   Labs on Admission: I have personally reviewed following labs and imaging studies  CBC: Recent Labs  Lab 10/08/21 1350  WBC 4.8  NEUTROABS 3.4  HGB 13.7  HCT 42.4  MCV 94.6  PLT 440   Basic Metabolic Panel: Recent Labs  Lab 10/08/21 1350  NA 138  K 3.6  CL 105  CO2 24   GLUCOSE 229*  BUN 9  CREATININE 0.85  CALCIUM 8.8*   GFR: Estimated Creatinine Clearance: 98.5 mL/min (by C-G formula based on SCr of 0.85 mg/dL). Liver Function Tests: Recent Labs  Lab 10/08/21 1350  AST 23  ALT 34  ALKPHOS 42  BILITOT 0.8  PROT 7.5  ALBUMIN 3.5   Urine analysis:    Component Value Date/Time   COLORURINE STRAW (A) 10/08/2021 1336   APPEARANCEUR CLEAR 10/08/2021 1336   APPEARANCEUR Hazy 09/28/2013 1707   LABSPEC 1.011 10/08/2021 1336   LABSPEC 1.017 09/28/2013 1707   PHURINE 6.0 10/08/2021 1336   GLUCOSEU 50 (A) 10/08/2021 1336   GLUCOSEU Negative 09/28/2013 1707   HGBUR NEGATIVE 10/08/2021 1336   BILIRUBINUR NEGATIVE 10/08/2021 1336   BILIRUBINUR Negative 09/28/2013 1707   KETONESUR NEGATIVE 10/08/2021 1336   PROTEINUR NEGATIVE 10/08/2021 1336   UROBILINOGEN 0.2 03/24/2014 1341   NITRITE NEGATIVE 10/08/2021 1336   LEUKOCYTESUR NEGATIVE 10/08/2021 1336   LEUKOCYTESUR Negative 09/28/2013 1707    Radiological Exams on Admission: MR Lumbar Spine W Wo Contrast (assess for abscess, cord compression)  Result Date: 10/08/2021 CLINICAL DATA:  Provided history: Low back pain, infection suspected, positive x-ray/CT. EXAM: MRI LUMBAR SPINE WITHOUT AND WITH CONTRAST TECHNIQUE: Multiplanar and multiecho pulse sequences of the lumbar spine were obtained without and with intravenous contrast. CONTRAST:  7.5 mL Vueway intravenous contrast. COMPARISON:  Lumbar spine MRI 07/04/2021. Lumbar spine radiographs 06/10/2021. FINDINGS: Segmentation: 5 lumbar vertebrae. The caudal most well-formed intervertebral disc space is designated L5-S1. Alignment: Dextrocurvature of the lumbar spine. 3 mm L1-L2 grade 1 retrolisthesis. Vertebrae: Persistent prominent marrow edema and enhancement within the L5 vertebral body, within the S1 body and extending into the left greater than right sacral alae. L5-S1 endplate erosion has progressed since the prior MRI of 07/04/2021. Progressive  abnormal ventral epidural enhancement at this level, now measuring 8 mm in AP dimension (previously measuring 5 mm). There are small foci of hypoenhancement centrally, and this likely reflects epidural phlegmon with superimposed small epidural abscesses. Persistent abnormal enhancement ventral to the L5-S1 disc space and L5 vertebral body, now measuring 6 mm in AP dimension. A small focus of internal  hypoenhancement is present near the level of the disc space, in this likely reflects prevertebral phlegmon with a superimposed small abscess (series 10, image 5). Persistent more generalized paraspinal inflammation centered about the L5-S1 disc space with abnormal enhancement extending into the bilateral neural foramina. Redemonstrated sequelae of remote discitis/osteomyelitis at L2-L3 with fatty marrow conversion. Minimal endplate edema and enhancement at this level, similar to the prior examination and likely degenerative. Patchy fatty marrow conversion elsewhere within the lumbar and visualized lower thoracic spine. Unchanged chronic L1 superior endplate vertebral compression fracture with superimposed Schmorl node. Multilevel ventrolateral osteophytes. Conus medullaris and cauda equina: Conus extends to the L2 level. No signal abnormality identified within the visualized distal spinal cord. No pathologic enhancement of the visualized distal spinal cord or cauda equina nerve roots. Paraspinal and other soft tissues: Paraspinal inflammation centered at the L5-S1 level, as described above. No acute abnormality identified within included portions of the abdomen/retroperitoneum. Disc levels: Unless otherwise stated, the level by level findings below have not significantly changed from the prior MRI of 07/04/2021. Multilevel disc height loss, greatest at T11-T12 (moderate), T12-L1 (moderate), L1-L2 (moderate), L2-L3 (severe) and L5-S1 (severe). T12-L1: No significant spinal canal or foraminal stenosis. L1-L2: Grade 1  retrolisthesis. Disc bulge asymmetric to the left with endplate osteophytes. Superimposed small central disc protrusion. A shallow broad-based left subarticular/foraminal disc protrusion is also present. Facet arthrosis. The central disc protrusion results in mild relative narrowing of the central canal (without spinal cord mass effect). The left subarticular/foraminal disc protrusion contributes to mild left neural foraminal narrowing and contacts the undersurface of the exiting left L1 nerve root. L2-L3: Disc collapse with endplate remodeling. Facet arthrosis. Mild effacement of the ventral thecal sac (without significant subarticular or central canal stenosis). Mild relative bilateral neural foraminal narrowing L3-L4: Disc bulge. Superimposed broad-based left subarticular/foraminal disc protrusion with associated osteophyte ridge. Moderate facet arthrosis with ligamentum flavum hypertrophy. Minimal relative left subarticular narrowing (without nerve root impingement). Central canal patent. The disc protrusion contributes to mild-to-moderate left neural foraminal narrowing, and contacts the undersurface of the exiting left L3 nerve root. L4-L5: Slight disc bulge. Moderate facet arthrosis v with oice ligamentum flavum hypertrophy. No significant spinal canal stenosis. Mild relative bilateral neural foraminal narrowing. L5-S1: Disc bulging with endplate spurring and facet arthrosis. Sequelae of discitis/osteomyelitis, as described above. Progressive signal abnormality within the left center to left extraforaminal zones, likely reflecting a combination of herniated disc and epidural phlegmon/abscess, as well as paraspinal phlegmon. Resultant left subarticular stenosis with posterior displacement of the descending left S1 nerve root. No significant central canal stenosis. Severe left neural foraminal narrowing. These results will be called to the ordering clinician or representative by the Radiologist Assistant, and  communication documented in the PACS or [employer]. IMPRESSION: Findings of persistent active discitis/osteomyelitis at L5-S1, as detailed. Endplate erosion has progressed. Progressive ventral epidural enhancement, likely reflecting epidural phlegmon with superimposed small epidural abscesses. Prevertebral phlegmon and superimposed small prevertebral abscess also present at this level. Progressive multifactorial L5-S1 left subarticular stenosis with posterior displacement of the descending left S1 nerve root. Progressive multifactorial severe left neural foraminal narrowing. Redemonstrated sequelae of remote L2-L3 discitis/osteomyelitis. No convincing MR evidence of active infection at this level at this time. Electronically Signed   By: Kellie Simmering D.O.   On: 10/08/2021 15:42    EKG: None  Assessment/Plan Principal Problem:   Epidural abscess Active Problems:   Bipolar disorder ([place])   Acute osteomyelitis of lumbar spine (HCC)   Essential hypertension  MDD (major depressive disorder), recurrent severe, without psychosis ([place])   Opioid use disorder, severe, dependence ([place])   Assessment and Plan: * Epidural abscess Rules out for sepsis. Recent hospitalization in June- for same, IR attempted biopsy, cultures without growth, discharged to complete 6 weeks of p.o. linezolid and Cipro.  4/5 weakness to left lower extremity, he reports urinary symptoms.  History of lumbar spine osteomyelitis, 05/2017, blood cultures grew viridans strep. - MRI findings today- Findings of persistent active discitis/osteomyelitis at L5-S1. Endplate erosion has progressed. Epidural phlegmon with superimposed small epidural abscesses. Prevertebral phlegmon and superimposed small prevertebral abscess also present at this level. Progressive multifactorial L5-S1 left subarticular stenosis with posterior displacement of the descending left S1 nerve root. Progressive multifactorial severe left neural foraminal narrowing. -  EDP talked to neurosurgery on-call, nurse practitioner Bergman-ad[facility] to [facility] under [facility]ist service, to see in consult. -IV vancomycin started in ED, will hold off on further antibiotics for now -N.p.o. midnight -IV ketorolac 27m x1 for now - IV dexamethasone 130mX 1 given in ED -[facility] - ESR, CRP - N/s + 20 kcl 100cc/hh x 15hrs -Patient has walked out of the ED at least 2ce today, with IV access in place. Says he went out to smoke cigarettes.  Diabetes mellitus ([place]- HgbA1c - Hold metformin - SSI- S  Opioid use disorder, severe, dependence ([place] his last use of IV drugs was 3 months ago.  Drug of choice IV fentanyl.  Also hx of methamphetamine and benzodiazepine abuse. Has been using opioids since he was 23.  - Currently on Vivitrol ( Once monthly Naltrexone injections), last dose was about 2 weeks ago. -Pain control in this patient will be challenging.  To reverse the effects of the Vivitrol, anesthesiology will need to be involved. - Resume clonidine - Check UDS- though our traditional UDS Joshua not pick up fentanyl or other synthetic opioids. -Lidocaine patch.  Essential hypertension Stable.   - Resume clonidine 0.61m761m times daily.  Bipolar disorder ([place] psych encounters and hospitalizations for IV drug use and overdose, suicidal ideations -Resume  Seroquel   DVT prophylaxis: SCDs, pending neurosurgery evaluation Code Status: Full code Family Communication: None at bedside Disposition Plan: > 2 days Consults called: Neurosurg Admission status: Inpt Med surg I certify that at the point of admission it is my clinical judgment that the patient will require inpatient hospital care spanning beyond 2 midnights from the point of admission due to high intensity of service, high risk for further deterioration and high frequency of surveillance required.    Author: EjiBethena RoysD 10/08/2021  10:50 PM  For on call review [email or web address]

## 2021-10-08 NOTE — Assessment & Plan Note (Addendum)
Insists his last use of IV drugs was 3 months ago.  Drug of choice IV fentanyl.  Also hx of methamphetamine and benzodiazepine abuse. Has been using opioids since he was 23.  - Currently on Vivitrol ( Once monthly Naltrexone injections), last dose was about 2 weeks ago. -Pain control in this patient will be challenging.  To reverse the effects of the Vivitrol, anesthesiology will need to be involved. - Resume clonidine - Check UDS- though our traditional UDS may not pick up fentanyl or other synthetic opioids. -Lidocaine patch.

## 2021-10-08 NOTE — Assessment & Plan Note (Addendum)
Rules out for sepsis. Recent hospitalization in June- for same, IR attempted biopsy, cultures without growth, discharged to complete 6 weeks of p.o. linezolid and Cipro.  4/5 weakness to left lower extremity, he reports urinary symptoms.  History of lumbar spine osteomyelitis, 05/2017, blood cultures grew viridans strep. - MRI findings today- Findings of persistent active discitis/osteomyelitis at L5-S1. Endplate erosion has progressed. Epidural phlegmon with superimposed small epidural abscesses. Prevertebral phlegmon and superimposed small prevertebral abscess also present at this level. Progressive multifactorial L5-S1 left subarticular stenosis with posterior displacement of the descending left S1 nerve root. Progressive multifactorial severe left neural foraminal narrowing. - EDP talked to neurosurgery on-call, nurse practitioner Bergman-admit to [facility] under hospitalist service, to see in consult. -IV vancomycin started in ED, will hold off on further antibiotics for now -N.p.o. midnight -IV ketorolac 52m x1 for now - IV dexamethasone 18mX 1 given in ED -Will need [facility] consult [place] [facility] cultures - ESR, CRP - N/s + 20 kcl 100cc/hh x 15hrs -Patient has walked out of the ED at least 2ce today, with IV access in place. Says he went out to smoke cigarettes.

## 2021-10-08 NOTE — ED Notes (Signed)
Unable to update vitals at this time. D/t pt in MRI.

## 2021-10-08 NOTE — Assessment & Plan Note (Addendum)
Several psych encounters and hospitalizations for IV drug use and overdose, suicidal ideations -Resume  Seroquel

## 2021-10-08 NOTE — Progress Notes (Signed)
Pharmacy Antibiotic Note  Joshua Thomas is a 46 y.o. male admitted on 10/08/2021 with  epidural abscess .  Pharmacy has been consulted for Vancomycin dosing.  Plan: Vancomycin 1500 mg IV loading dose then 1000mg  IV Q 12 hrs. Goal AUC 400-550. Expected AUC: 469 SCr used: 0.85 F/U cxs and clinical progress Monitor V/S, labs and levels as indicated   Height: 5\' 3"  (160 cm) Weight: 74.8 kg (165 lb) IBW/kg (Calculated) : 56.9  Temp (24hrs), Avg:98.2 F (36.8 C), Min:98.2 F (36.8 C), Max:98.2 F (36.8 C)  Recent Labs  Lab 10/08/21 1350  WBC 4.8  CREATININE 0.85    Estimated Creatinine Clearance: 98.5 mL/min (by C-G formula based on SCr of 0.85 mg/dL).    Allergies  Allergen Reactions   Morphine Itching   Tylenol [Acetaminophen] Other (See Comments)    Due to Ulcers/ Liver Damage    Antimicrobials this admission: vancomycin 9/28 >>   Microbiology results: No cxs  Thank you for allowing pharmacy to be a part of this patient's care.  Isac Sarna, BS Pharm D, BCPS Clinical Pharmacist Pager [phone] 10/08/2021 4:00 PM

## 2021-10-08 NOTE — ED Triage Notes (Signed)
Pt presents to ED with complaints of severe lower back pain. Pt ambulatory to triage. Pt states he just recently got out of rehab, states pain got worse over last 3 months.

## 2021-10-08 NOTE — Assessment & Plan Note (Addendum)
Stable.   - Resume clonidine 0.1mg  4 times daily.

## 2021-10-08 NOTE — Assessment & Plan Note (Signed)
>>  ASSESSMENT AND PLAN FOR BIPOLAR DISORDER (HCC) WRITTEN ON 10/08/2021  7:29 PM BY EMOKPAE, EJIROGHENE E, MD  Several psych encounters and hospitalizations for IV drug use and overdose, suicidal ideations -Resume  Seroquel

## 2021-10-08 NOTE — ED Notes (Signed)
Pt to MRI at this time.

## 2021-10-08 NOTE — ED Notes (Addendum)
Pt went outside with with IV intact; saline locked. Pt is unable to be found at this time. Provider made aware as well as charge Therapist, sports.  Pt back at bedside at this time

## 2021-10-09 DIAGNOSIS — G062 Extradural and subdural abscess, unspecified: Secondary | ICD-10-CM | POA: Diagnosis not present

## 2021-10-09 DIAGNOSIS — M4626 Osteomyelitis of vertebra, lumbar region: Secondary | ICD-10-CM | POA: Diagnosis not present

## 2021-10-09 LAB — HEMOGLOBIN A1C
Hgb A1c MFr Bld: 7.1 % — ABNORMAL HIGH (ref 4.8–5.6)
Mean Plasma Glucose: 157.07 mg/dL

## 2021-10-09 LAB — CBC
HCT: 40.5 % (ref 39.0–52.0)
Hemoglobin: 13.1 g/dL (ref 13.0–17.0)
MCH: 30.4 pg (ref 26.0–34.0)
MCHC: 32.3 g/dL (ref 30.0–36.0)
MCV: 94 fL (ref 80.0–100.0)
Platelets: 174 10*3/uL (ref 150–400)
RBC: 4.31 MIL/uL (ref 4.22–5.81)
RDW: 14.1 % (ref 11.5–15.5)
WBC: 6 10*3/uL (ref 4.0–10.5)
nRBC: 0 % (ref 0.0–0.2)

## 2021-10-09 LAB — BASIC METABOLIC PANEL
Anion gap: 1 — ABNORMAL LOW (ref 5–15)
BUN: 21 mg/dL — ABNORMAL HIGH (ref 6–20)
CO2: 23 mmol/L (ref 22–32)
Calcium: 9.1 mg/dL (ref 8.9–10.3)
Chloride: 111 mmol/L (ref 98–111)
Creatinine, Ser: 0.8 mg/dL (ref 0.61–1.24)
GFR, Estimated: >60 mL/min (ref >60)
Glucose, Bld: 310 mg/dL — ABNORMAL HIGH (ref 70–99)
Potassium: 4.3 mmol/L (ref 3.5–5.1)
Sodium: 135 mmol/L (ref 135–145)

## 2021-10-09 LAB — CBG MONITORING, ED
Glucose-Capillary: 300 mg/dL — ABNORMAL HIGH (ref 70–99)
Glucose-Capillary: 305 mg/dL — ABNORMAL HIGH (ref 70–99)
Glucose-Capillary: 270 mg/dL — ABNORMAL HIGH (ref 70–99)
Glucose-Capillary: 105 mg/dL — ABNORMAL HIGH (ref 70–99)

## 2021-10-09 LAB — GLUCOSE, CAPILLARY
Glucose-Capillary: 100 mg/dL — ABNORMAL HIGH (ref 70–99)
Glucose-Capillary: 182 mg/dL — ABNORMAL HIGH (ref 70–99)
Glucose-Capillary: 213 mg/dL — ABNORMAL HIGH (ref 70–99)

## 2021-10-09 LAB — C-REACTIVE PROTEIN: CRP: 1.1 mg/dL — ABNORMAL HIGH (ref <1.0)

## 2021-10-09 MED ORDER — INSULIN ASPART 100 UNIT/ML IJ SOLN
0.0000 [IU] | INTRAMUSCULAR | Status: DC
  Administered 2021-10-09: 5 [IU] via SUBCUTANEOUS
  Administered 2021-10-09: 3 [IU] via SUBCUTANEOUS
  Administered 2021-10-10: 5 [IU] via SUBCUTANEOUS

## 2021-10-09 MED ORDER — KETOROLAC TROMETHAMINE 30 MG/ML IJ SOLN
30.0000 mg | Freq: Four times a day (QID) | INTRAMUSCULAR | Status: AC | PRN
  Administered 2021-10-09 – 2021-10-10 (×5): 30 mg via INTRAVENOUS
  Filled 2021-10-09 (×5): qty 1

## 2021-10-09 MED ORDER — INSULIN GLARGINE-YFGN 100 UNIT/ML ~~LOC~~ SOLN
8.0000 [IU] | Freq: Once | SUBCUTANEOUS | Status: AC
  Administered 2021-10-09: 8 [IU] via SUBCUTANEOUS
  Filled 2021-10-09: qty 0.08

## 2021-10-09 NOTE — Progress Notes (Signed)
PROGRESS NOTE    Joshua Thomas  XVQ:[id number] DOB: 1975-07-02 DOA: 10/08/2021 PCP: Merryl Hacker, No   Brief Narrative:    Joshua Thomas is a 46 y.o. male with medical history significant for lumbar osteomyelitis, and opioid use disorder, IV drug use (fentanyl).   He presented to the ED with complaints of increasing low back pain over the past 3 months.  He is noted to have L5/S1 active discitis/osteomyelitis with evidence of epidural abscess.  He has previously had this aspirated in June of this year and has completed a course of oral antibiotics with linezolid and ciprofloxacin.  He is currently awaiting transfer to [facility] for neurosurgery evaluation as well as ID evaluation.  Plan is to continue to withhold antibiotics at this time.  Blood cultures with no growth noted thus far.  He is noted to have some mild hyperglycemia that is steroid-induced from Decadron given in ED.  Assessment & Plan:   Principal Problem:   Epidural abscess Active Problems:   Bipolar disorder ([place])   Acute osteomyelitis of lumbar spine ([place])   Essential hypertension   MDD (major depressive disorder), recurrent severe, without psychosis ([place])   Opioid use disorder, severe, dependence ([place])   Diabetes mellitus ([place])  Assessment and Plan:   Epidural abscess Rules out for sepsis. Recent hospitalization in June- for same, IR attempted biopsy, cultures without growth, discharged to complete 6 weeks of p.o. linezolid and Cipro.  4/5 weakness to left lower extremity, he reports urinary symptoms.  History of lumbar spine osteomyelitis, 05/2017, blood cultures grew viridans strep. - MRI findings today- Findings of persistent active discitis/osteomyelitis at L5-S1. Endplate erosion has progressed. Epidural phlegmon with superimposed small epidural abscesses. Prevertebral phlegmon and superimposed small prevertebral abscess also present at this level. Progressive multifactorial L5-S1 left subarticular stenosis with posterior  displacement of the descending left S1 nerve root. Progressive multifactorial severe left neural foraminal narrowing. - EDP talked to neurosurgery on-call, nurse practitioner [place]-admit to [facility] under hospitalist service, to see in consult; currently awaiting transfer -IV vancomycin started in ED, will hold off on further antibiotics for now -N.p.o. midnight, resume diet for now as it is unlikely that patient will have any procedure today -IV ketorolac 86m as needed every 6 hours - IV dexamethasone 114mX 1 given in ED -Disccussed case with ID with recommendation to hold antibiotics for now, [facility] - ESR, CRP -Patient has walked out of the ED at least twice today, with IV access in place. Says he went out to smoke cigarettes.   Diabetes mellitus with steroid-induced hyperglycemia (HCC) - HgbA1c 7.1% - Hold metformin -Given Semglee today -Continue SSI -Carb modified diet   Opioid use disorder, severe, dependence ([place] his last use of IV drugs was 3 months ago.  Drug of choice IV fentanyl.  Also hx of methamphetamine and benzodiazepine abuse. Has been using opioids since he was 23.  - Currently on Vivitrol ( Once monthly Naltrexone injections), last dose was about 2 weeks ago. -Pain control in this patient will be challenging.  To reverse the effects of the Vivitrol, anesthesiology will need to be involved. - Resume clonidine - Check UDS- though our traditional UDS may not pick up fentanyl or other synthetic opioids. -Lidocaine patch.   Essential hypertension Stable.   - Resume clonidine 0.35m735m times daily.   Bipolar disorder (HCCRoseeveral psych encounters and hospitalizations for IV drug use and  overdose, suicidal ideations -Resume  Seroquel  Tobacco abuse Counseled on cessation    DVT prophylaxis: SCDs Code Status: Full Family Communication: None at bedside Disposition Plan:  Status is:  Inpatient Remains inpatient appropriate because: Need for IV medications.   Consultants:  Neurosurgery notified by EDP ID-spoke with Dr. Tommy Medal  Procedures:  None  Antimicrobials:  Anti-infectives (From admission, onward)    Start     Dose/Rate Route Frequency Ordered Stop   10/09/21 0400  vancomycin (VANCOCIN) IVPB 1000 mg/200 mL premix  Status:  Discontinued        1,000 mg 200 mL/hr over 60 Minutes Intravenous Every 12 hours 10/08/21 1559 10/08/21 1926   10/09/21 0400  vancomycin (VANCOCIN) IVPB 1000 mg/200 mL premix        1,000 mg 200 mL/hr over 60 Minutes Intravenous Every 12 hours 10/08/21 1947     10/08/21 1600  vancomycin (VANCOREADY) IVPB 1500 mg/300 mL        1,500 mg 150 mL/hr over 120 Minutes Intravenous  Once 10/08/21 1550 10/08/21 1815       Subjective: Patient seen and evaluated today and appears a little restless due to his ongoing pain symptoms in his low back.  Pain medications appear to be helping somewhat.  Objective: Vitals:   10/09/21 0400 10/09/21 0530 10/09/21 0818 10/09/21 0825  BP: (!) 135/93   (!) 156/94  Pulse: 84 77  77  Resp: _0 Temp:   98 F (36.7 C)   TempSrc:   Oral   SpO2: 92% 98%  97%  Weight:      Height:        Intake/Output Summary (Last 24 hours) at 10/09/2021 1109 Last data filed at 10/09/2021 1108 Gross per 24 hour  Intake 1881.54 ml  Output --  Net 1881.54 ml   Filed Weights   10/08/21 1256  Weight: 74.8 kg    Examination:  General exam: Appears calm and comfortable  Respiratory system: Clear to auscultation. Respiratory effort normal. Cardiovascular system: S1 & S2 heard, RRR.  Gastrointestinal system: Abdomen is soft Central nervous system: Alert and awake Extremities: No edema Skin: No significant lesions noted Psychiatry: Flat affect.    Data Reviewed: I have personally reviewed following labs and imaging studies  CBC: Recent Labs  Lab 10/08/21 1350 10/09/21 0346  WBC 4.8 6.0  NEUTROABS  3.4  --   HGB 13.7 13.1  HCT 42.4 40.5  MCV 94.6 94.0  PLT 161 149   Basic Metabolic Panel: Recent Labs  Lab 10/08/21 1350 10/09/21 0346  NA 138 135  K 3.6 4.3  CL 105 111  CO2 24 23  GLUCOSE 229* 310*  BUN 9 21*  CREATININE 0.85 0.80  CALCIUM 8.8* 9.1   GFR: Estimated Creatinine Clearance: 104.6 mL/min (by C-G formula based on SCr of 0.8 mg/dL). Liver Function Tests: Recent Labs  Lab 10/08/21 1350  AST 23  ALT 34  ALKPHOS 42  BILITOT 0.8  PROT 7.5  ALBUMIN 3.5   No results for input(s): "LIPASE", "AMYLASE" in the last 168 hours. No results for input(s): "AMMONIA" in the last 168 hours. Coagulation Profile: No results for input(s): "INR", "PROTIME" in the last 168 hours. Cardiac Enzymes: No results for input(s): "CKTOTAL", "CKMB", "CKMBINDEX", "TROPONINI" in the last 168 hours. BNP (last 3 results) No results for input(s): "PROBNP" in the last 8760 hours. HbA1C: Recent Labs    10/08/21 1959  HGBA1C 7.1*   CBG: Recent Labs  Lab  10/08/21 2017 10/09/21 0209 10/09/21 0823  GLUCAP 300* 305* 270*   Lipid Profile: No results for input(s): "CHOL", "HDL", "LDLCALC", "TRIG", "CHOLHDL", "LDLDIRECT" in the last 72 hours. Thyroid Function Tests: No results for input(s): "TSH", "T4TOTAL", "FREET4", "T3FREE", "THYROIDAB" in the last 72 hours. Anemia Panel: No results for input(s): "VITAMINB12", "FOLATE", "FERRITIN", "TIBC", "IRON", "RETICCTPCT" in the last 72 hours. Sepsis Labs: No results for input(s): "PROCALCITON", "LATICACIDVEN" in the last 168 hours.  Recent Results (from the past 240 hour(s))  Culture, blood (Routine X 2) w Reflex to ID Panel     Status: None (Preliminary result)   Collection Time: 10/08/21  7:59 PM   Specimen: BLOOD LEFT ARM  Result Value Ref Range Status   Specimen Description BLOOD LEFT ARM  Final   Special Requests   Final    BOTTLES DRAWN AEROBIC AND ANAEROBIC Blood Culture adequate volume   Culture   Final    NO GROWTH < 24  HOURS Performed at [facility], [address] [place], [place] [address]    Report Status PENDING  Incomplete  Culture, blood (Routine X 2) w Reflex to ID Panel     Status: None (Preliminary result)   Collection Time: 10/08/21  7:59 PM   Specimen: BLOOD RIGHT ARM  Result Value Ref Range Status   Specimen Description BLOOD RIGHT ARM  Final   Special Requests   Final    BOTTLES DRAWN AEROBIC AND ANAEROBIC Blood Culture adequate volume   Culture   Final    NO GROWTH < 24 HOURS Performed at [facility], [address] [place], [place] [address]    Report Status PENDING  Incomplete         Radiology Studies: MR Lumbar Spine W Wo Contrast (assess for abscess, cord compression)  Result Date: 10/08/2021 CLINICAL DATA:  Provided history: Low back pain, infection suspected, positive x-ray/CT. EXAM: MRI LUMBAR SPINE WITHOUT AND WITH CONTRAST TECHNIQUE: Multiplanar and multiecho pulse sequences of the lumbar spine were obtained without and with intravenous contrast. CONTRAST:  7.5 mL Vueway intravenous contrast. COMPARISON:  Lumbar spine MRI 07/04/2021. Lumbar spine radiographs 06/10/2021. FINDINGS: Segmentation: 5 lumbar vertebrae. The caudal most well-formed intervertebral disc space is designated L5-S1. Alignment: Dextrocurvature of the lumbar spine. 3 mm L1-L2 grade 1 retrolisthesis. Vertebrae: Persistent prominent marrow edema and enhancement within the L5 vertebral body, within the S1 body and extending into the left greater than right sacral alae. L5-S1 endplate erosion has progressed since the prior MRI of 07/04/2021. Progressive abnormal ventral epidural enhancement at this level, now measuring 8 mm in AP dimension (previously measuring 5 mm). There are small foci of hypoenhancement centrally, and this likely reflects epidural phlegmon with superimposed small epidural abscesses. Persistent abnormal enhancement ventral to the L5-S1 disc space and L5 vertebral body, now measuring 6 mm in AP  dimension. A small focus of internal hypoenhancement is present near the level of the disc space, in this likely reflects prevertebral phlegmon with a superimposed small abscess (series 10, image 5). Persistent more generalized paraspinal inflammation centered about the L5-S1 disc space with abnormal enhancement extending into the bilateral neural foramina. Redemonstrated sequelae of remote discitis/osteomyelitis at L2-L3 with fatty marrow conversion. Minimal endplate edema and enhancement at this level, similar to the prior examination and likely degenerative. Patchy fatty marrow conversion elsewhere within the lumbar and visualized lower thoracic spine. Unchanged chronic L1 superior endplate vertebral compression fracture with superimposed Schmorl node. Multilevel ventrolateral osteophytes. Conus medullaris and cauda equina: Conus extends to  the L2 level. No signal abnormality identified within the visualized distal spinal cord. No pathologic enhancement of the visualized distal spinal cord or cauda equina nerve roots. Paraspinal and other soft tissues: Paraspinal inflammation centered at the L5-S1 level, as described above. No acute abnormality identified within included portions of the abdomen/retroperitoneum. Disc levels: Unless otherwise stated, the level by level findings below have not significantly changed from the prior MRI of 07/04/2021. Multilevel disc height loss, greatest at T11-T12 (moderate), T12-L1 (moderate), L1-L2 (moderate), L2-L3 (severe) and L5-S1 (severe). T12-L1: No significant spinal canal or foraminal stenosis. L1-L2: Grade 1 retrolisthesis. Disc bulge asymmetric to the left with endplate osteophytes. Superimposed small central disc protrusion. A shallow broad-based left subarticular/foraminal disc protrusion is also present. Facet arthrosis. The central disc protrusion results in mild relative narrowing of the central canal (without spinal cord mass effect). The left subarticular/foraminal  disc protrusion contributes to mild left neural foraminal narrowing and contacts the undersurface of the exiting left L1 nerve root. L2-L3: Disc collapse with endplate remodeling. Facet arthrosis. Mild effacement of the ventral thecal sac (without significant subarticular or central canal stenosis). Mild relative bilateral neural foraminal narrowing L3-L4: Disc bulge. Superimposed broad-based left subarticular/foraminal disc protrusion with associated osteophyte ridge. Moderate facet arthrosis with ligamentum flavum hypertrophy. Minimal relative left subarticular narrowing (without nerve root impingement). Central canal patent. The disc protrusion contributes to mild-to-moderate left neural foraminal narrowing, and contacts the undersurface of the exiting left L3 nerve root. L4-L5: Slight disc bulge. Moderate facet arthrosis v with oice ligamentum flavum hypertrophy. No significant spinal canal stenosis. Mild relative bilateral neural foraminal narrowing. L5-S1: Disc bulging with endplate spurring and facet arthrosis. Sequelae of discitis/osteomyelitis, as described above. Progressive signal abnormality within the left center to left extraforaminal zones, likely reflecting a combination of herniated disc and epidural phlegmon/abscess, as well as paraspinal phlegmon. Resultant left subarticular stenosis with posterior displacement of the descending left S1 nerve root. No significant central canal stenosis. Severe left neural foraminal narrowing. These results will be called to the ordering clinician or representative by the Radiologist Assistant, and communication documented in the PACS or [employer]. IMPRESSION: Findings of persistent active discitis/osteomyelitis at L5-S1, as detailed. Endplate erosion has progressed. Progressive ventral epidural enhancement, likely reflecting epidural phlegmon with superimposed small epidural abscesses. Prevertebral phlegmon and superimposed small prevertebral abscess also  present at this level. Progressive multifactorial L5-S1 left subarticular stenosis with posterior displacement of the descending left S1 nerve root. Progressive multifactorial severe left neural foraminal narrowing. Redemonstrated sequelae of remote L2-L3 discitis/osteomyelitis. No convincing MR evidence of active infection at this level at this time. Electronically Signed   By: Kellie Simmering D.O.   On: 10/08/2021 15:42        Scheduled Meds:  cloNIDine  0.1 mg Oral QID   insulin aspart  0-9 Units Subcutaneous Q6H   lidocaine  2 patch Transdermal Q24H   melatonin  6 mg Oral QHS   prazosin  1 mg Oral QHS   QUEtiapine  100 mg Oral QHS   rOPINIRole  0.5 mg Oral QHS   Continuous Infusions:  vancomycin Stopped (10/09/21 0914)     LOS: 1 day    Time spent: 35 minutes     Darleen Crocker, DO [facility]  If 7PM-7AM, please contact night-coverage [email or web address] 10/09/2021, 11:09 AM

## 2021-10-09 NOTE — Inpatient Diabetes Management (Signed)
Inpatient Diabetes Program Recommendations  AACE/ADA: New Consensus Statement on Inpatient Glycemic Control   Target Ranges:  Prepandial:   less than 140 mg/dL      Peak postprandial:   less than 180 mg/dL (1-2 hours)      Critically ill patients:  140 - 180 mg/dL    Latest Reference Range & Units 10/09/21 02:09 10/09/21 08:23  Glucose-Capillary 70 - 99 mg/dL 305 (H) 270 (H)    Latest Reference Range & Units 10/08/21 20:17  Glucose-Capillary 70 - 99 mg/dL 300 (H)    Latest Reference Range & Units 10/08/20 14:24 12/09/20 06:26  Hemoglobin A1C 4.8 - 5.6 % 6.9 (H) 6.5 (H)   Review of Glycemic Control  Diabetes history: DM2 Outpatient Diabetes medications: Metformin XR 750 QPM Current orders for Inpatient glycemic control: Novolog 0-9 units Q6H  Inpatient Diabetes Program Recommendations:    Insulin: Noted patient received Decadron 10 mg on 10/08/21 at 14:25 pm which is contributing to hyperglycemia. Please consider ordering one time Semglee 8 units x1 now.  Thanks, Barnie Alderman, RN, MSN, [place] Diabetes Coordinator Inpatient Diabetes Program [phone] (Team Pager from 8am to [place])

## 2021-10-09 NOTE — ED Notes (Signed)
Lab at bedside

## 2021-10-09 NOTE — Consult Note (Signed)
[facility] for Infectious Disease    Date of Admission:  10/08/2021     Reason for Consult: recurrent lumbar osteomyelitis    Referring Provider: Wynelle Cleveland     Abx: 9/28 one dose of vanc  Previous 6 weeks empiric linezolid/cipro finished about 2-3 weeks prior to this admission        Assessment: #ivdu #hx L5-S1 discitis/om Negative aspirate cx 6/26 S/p 6 weeks abx transitioned to po linezolid/cipro, finished by early 09/2021. Crp normalizes however pain remains Worsening pain the last several weeks; repeat mri this admission showed epidural enhancement/phlegmon and progress l5-s1 vertebral body erosion Crp also trending up   Patient said he has been in a drug rehab and no ivdu since 3 months prior to this admission  Agree will potentially benefit from diagnostic standpoint with repeat ir guided biopsy of l5-s1. Taken in consideration of failed tx and also negative ir biopsy prior and low yield, open biopsy potentially would be more helpful but will await NSG assessment on risk benefit  He has some radiculopathic sx but otherwise no true neurologic compromise on my assessment   #chronic hep C  Last viral load 07/05/21 20; never treated; first positive test known to our system 2019 Will repeat hcv viral load   Plan: Would consider biopsy next week to give vancomycin time to wear off by then After biopsy would restart empiric abx; oral abx should be fine, hopefully culture is positive and can guide abx choice Empiric abx would be reasonable with doxy 100 mg po bid and cefadroxil 1000 mg po bid I have also setup appointment for him at [facility] in 10 days if he get discharged early Discussed with primary team   I spent 75 minute reviewing data/chart, and coordinating care and >50% direct face to face time providing counseling/discussing diagnostics/treatment plan with patient      ------------------------------------------------ Principal Problem:   Epidural  abscess Active Problems:   Bipolar disorder ([place])   Acute osteomyelitis of lumbar spine ([place])   Essential hypertension   MDD (major depressive disorder), recurrent severe, without psychosis ([place])   Opioid use disorder, severe, dependence ([place])   Diabetes mellitus ([place])    HPI: Joshua Thomas is a 46 y.o. male recently seen by id for l5-s1 discitis  Patient ivdu but clean last 3 months as he is in a drug rehab program  He was seen initially by id late 06/2021 for L5-S1 discitis. Ir sampling cx negative. He underwent 6 weeks iv--> po abx with finishing on cipro/linezolid. His crp normalized on tx but pain never gone away  He finished abx early 09/2021  He said his pain for the last 3 weeks has gotten worse since off abx with shooting pain to bilateral feet when he coughed. At baseline feel like there is vibratory pressure on his feet.   No urinary or bowel incontinence  No f/c  He feels his legs could give out if his grand child runs into him due to the pain  Again denies ongoing ivdu  On admission afebrile No leukocytosis crp slightly up Mri l-spine showed progressive L5-s1 erosion with epidural phlegmon  Nsg to see; he was transferred from [facility] here He did receive a dose of vanc  Bcx on admission was obtained at [facility]    Family History  Problem Relation Age of Onset   Diabetes Mother    Alcohol abuse Father    Cirrhosis Father  Aneurysm Father     Social History   Tobacco Use   Smoking status: Every Day    Packs/day: 1.00    Years: 33.00    Total pack years: 33.00    Types: Cigarettes   Smokeless tobacco: Former    Quit date: 08/08/2006   Tobacco comments:    1 PPD  Substance Use Topics   Alcohol use: Not Currently    Alcohol/week: 1.0 standard drink of alcohol    Types: 1 Standard drinks or equivalent per week    Comment: occasionally   Drug use: Yes    Types: IV, Fentanyl, Benzodiazepines    Comment: uses fentanyl every day    Allergies   Allergen Reactions   Morphine Itching   Tylenol [Acetaminophen] Other (See Comments)    Due to Ulcers/ Liver Damage    Review of Systems: ROS All Other ROS was negative, except mentioned above   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   COPD (chronic obstructive pulmonary disease) (HCC)    DDD (degenerative disc disease), lumbar    Degenerative disc disease, lumbar    Depression    Discitis    GERD (gastroesophageal reflux disease)    Hepatitis C    Hepatitis C    History of stomach ulcers    Hx of diabetes mellitus    due to infection   Hypertension    Lung nodules    3 on L and 2 on R   PTSD (post-traumatic stress disorder)    Sciatica    Substance abuse (HCC)        Scheduled Meds:  cloNIDine  0.1 mg Oral QID   insulin aspart  0-15 Units Subcutaneous Q4H   lidocaine  2 patch Transdermal Q24H   melatonin  6 mg Oral QHS   prazosin  1 mg Oral QHS   QUEtiapine  100 mg Oral QHS   rOPINIRole  0.5 mg Oral QHS   Continuous Infusions: PRN Meds:.ibuprofen, ketorolac, methocarbamol, ondansetron **OR** ondansetron (ZOFRAN) IV, polyethylene glycol   OBJECTIVE: Blood pressure (!) 145/97, pulse 65, temperature 97.7 F (36.5 C), resp. rate 20, height 5\' 3"  (1.6 m), weight 74.8 kg, SpO2 97 %.  Physical Exam  General/constitutional: no distress, pleasant HEENT: Normocephalic, PER, Conj Clear, EOMI, Oropharynx clear Neck supple CV: rrr no mrg Lungs: clear to auscultation, normal respiratory effort Abd: Soft, Nontender Ext: no edema Skin: No Rash Neuro: nonfocal; no le clonus/paresthesia MSK: strength symmetric 5/5 bilateral LE; mod mid back lumbar tenderness; no fluctuance    Lab Results Lab Results  Component Value Date   WBC 6.0 10/09/2021   HGB 13.1  10/09/2021   HCT 40.5 10/09/2021   MCV 94.0 10/09/2021   PLT 174 10/09/2021    Lab Results  Component Value Date   CREATININE 0.80 10/09/2021   BUN 21 (H) 10/09/2021   NA 135 10/09/2021   K 4.3 10/09/2021   CL 111 10/09/2021   CO2 23 10/09/2021    Lab Results  Component Value Date   ALT 34 10/08/2021   AST 23 10/08/2021   ALKPHOS 42 10/08/2021   BILITOT 0.8 10/08/2021      Microbiology: Recent Results (from the past 240 hour(s))  Culture, blood (Routine X 2)  w Reflex to ID Panel     Status: None (Preliminary result)   Collection Time: 10/08/21  7:59 PM   Specimen: BLOOD LEFT ARM  Result Value Ref Range Status   Specimen Description BLOOD LEFT ARM  Final   Special Requests   Final    BOTTLES DRAWN AEROBIC AND ANAEROBIC Blood Culture adequate volume   Culture   Final    NO GROWTH < 24 HOURS Performed at [facility], [address] [place], [place] [address]    Report Status PENDING  Incomplete  Culture, blood (Routine X 2) w Reflex to ID Panel     Status: None (Preliminary result)   Collection Time: 10/08/21  7:59 PM   Specimen: BLOOD RIGHT ARM  Result Value Ref Range Status   Specimen Description BLOOD RIGHT ARM  Final   Special Requests   Final    BOTTLES DRAWN AEROBIC AND ANAEROBIC Blood Culture adequate volume   Culture   Final    NO GROWTH < 24 HOURS Performed at [facility], [address] [place], [place] [address]    Report Status PENDING  Incomplete     Serology:    Imaging: If present, new imagings (plain films, ct scans, and mri) have been personally visualized and interpreted; radiology reports have been reviewed. Decision making incorporated into the Impression / Recommendations.  9/28 lumbar mri Findings of persistent active discitis/osteomyelitis at L5-S1, as detailed. Endplate erosion has progressed. Progressive ventral epidural enhancement, likely reflecting epidural phlegmon with superimposed small epidural abscesses. Prevertebral  phlegmon and superimposed small prevertebral abscess also present at this level. Progressive multifactorial L5-S1 left subarticular stenosis with posterior displacement of the descending left S1 nerve root. Progressive multifactorial severe left neural foraminal narrowing.   Redemonstrated sequelae of remote L2-L3 discitis/osteomyelitis. No convincing MR evidence of active infection at this level at this time.    Jabier Mutton, [place] for Infectious [place] [phone] pager    10/09/2021, 1:43 PM

## 2021-10-09 NOTE — Consult Note (Addendum)
Neurosurgery Consultation  Reason for Consult: Recurrent / persistent lumbar discitis Referring Physician: Rizwan  CC: Back pain  HPI: This is a 46 y.o. man w/ h/o prior lumbar osteomyelitis / discitis 32mo ago w/ h/o IVDU, HCV, COPD. He completed course of Abx, now p/w worsening low back pain with some left leg weakness and urinary incontinence. No radicular pain, all low back pain, no new numbness in either lower extremity, no parasthesias, no bowel incontinence. No recent use of anti-platelet or anti-coagulant medications. No urinary retention or difficulty urinating, just feels like he has dribbling of small amounts of urine without noticing, is able to void normally when he tries. He is unsure how long the left foot has been weak, states it has been at least a week but likely longer on the order of a few weeks.    ROS: A 14 point ROS was performed and is negative except as noted in the HPI.   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 He has a 33.00 pack-year smoking history. He quit smokeless tobacco use about 15 years ago. He reports that he does  not currently use alcohol after a past usage of about 1.0 standard drink of alcohol per week. He reports current drug use. Drugs: IV, Fentanyl, and Benzodiazepines.  Exam: Vital signs in last 24 hours: Temp:  [97.3 F (36.3 C)-98.7 F (37.1 C)] 97.3 F (36.3 C) (09/30 0739) Pulse Rate:  [52-73] 59 (09/30 0739) Resp:  [17-20] 20 (09/30 0348) BP: (125-162)/(83-100) 158/100 (09/30 0739) SpO2:  [96 %-100 %] 99 % (09/30 0739) General: Awake, alert, cooperative, lying in bed in NAD Head: Normocephalic and atruamatic HEENT: Neck supple Pulmonary: breathing room air comfortably, no evidence of increased work of breathing Cardiac: RRR Abdomen: S NT ND Extremities: Warm and well perfused x4 Neuro: AOx3, PERRL, EOMI, FS Strength 5/5 x4 except for left EHL weakness 4-/5, SILTx4 Normal passive and active rectal tone, normal perineal sensation  Assessment and Plan: 46 y.o. man with recurrent epidural abscess and some new left EHL weakness, complaining of some urinary dribbling but no retention and able to void normally with normal sacral root function on exam. MRI L-spine personally reviewed, which shows progression of previously seen discitis at L5-S1. There is severe left foraminal stenosis at L5-S1 due to mainly phlegmon, some posterior displacement of the S1 root on the left but no apparent  compression, right sided roots are intact in the canal and foramina.   -discussed with the patient, the foot weakness is likely from his foraminal stenosis. He doesn't have cauda equina syndrome, his sacral function is intact, the urinary symptoms could be pain related but thankfully no evidence of neurogenic incontinence. Given the lack of formed abscess, removing that epidural phlegmon is quite difficult and decompression of that root would require a complete facetectomy. With the discitis, that would be destabilizing and require a fusion procedure, introducing hardware into the disc space and posterior elements,  which we should certainly avoid. Given that it has been present at least a week now, I think that the safest thing would be to treat this medically with a repeat disc space biopsy and antibiotics. If that fails or they're unable to access the disc space, then we'll have to reconsider. But I think it's best to avoid introducing hardware to a disc space with a particularly resistant infection like this. The patient is okay with this plan of care, will have to see if his foot weakness resolves as the infection is hopefully cleared. -please call with any concerns or questions  Jadene Pierini, MD 10/10/21 10:37 AM [facility]

## 2021-10-09 NOTE — TOC Progression Note (Signed)
  Transition of Care [facility]) Screening Note   Patient Details  Name: Joshua Thomas Date of Birth: Sep 23, 1975   Transition of Care [facility]) CM/SW Contact:    Boneta Lucks, RN Phone Number: 10/09/2021, 10:20 AM  Needing a bed at [place], waiting in ED   Transition of Care Department [facility]) has reviewed patient and no TOC needs have been identified at this time. We will continue to monitor patient advancement through interdisciplinary progression rounds. If new patient transition needs arise, please place a TOC consult.      Barriers to Discharge: Continued Medical Work up  Expected Discharge Plan and Services      Transferring to [employer]

## 2021-10-10 DIAGNOSIS — G062 Extradural and subdural abscess, unspecified: Secondary | ICD-10-CM | POA: Diagnosis not present

## 2021-10-10 LAB — GLUCOSE, CAPILLARY
Glucose-Capillary: 204 mg/dL — ABNORMAL HIGH (ref 70–99)
Glucose-Capillary: 161 mg/dL — ABNORMAL HIGH (ref 70–99)
Glucose-Capillary: 137 mg/dL — ABNORMAL HIGH (ref 70–99)
Glucose-Capillary: 172 mg/dL — ABNORMAL HIGH (ref 70–99)
Glucose-Capillary: 203 mg/dL — ABNORMAL HIGH (ref 70–99)
Glucose-Capillary: 195 mg/dL — ABNORMAL HIGH (ref 70–99)

## 2021-10-10 LAB — CBC
HCT: 39.8 % (ref 39.0–52.0)
Hemoglobin: 13 g/dL (ref 13.0–17.0)
MCH: 30.4 pg (ref 26.0–34.0)
MCHC: 32.7 g/dL (ref 30.0–36.0)
MCV: 93.2 fL (ref 80.0–100.0)
Platelets: 172 10*3/uL (ref 150–400)
RBC: 4.27 MIL/uL (ref 4.22–5.81)
RDW: 14.3 % (ref 11.5–15.5)
WBC: 5.8 10*3/uL (ref 4.0–10.5)
nRBC: 0 % (ref 0.0–0.2)

## 2021-10-10 LAB — BASIC METABOLIC PANEL
Anion gap: 6 (ref 5–15)
BUN: 20 mg/dL (ref 6–20)
CO2: 25 mmol/L (ref 22–32)
Calcium: 9.1 mg/dL (ref 8.9–10.3)
Chloride: 109 mmol/L (ref 98–111)
Creatinine, Ser: 0.86 mg/dL (ref 0.61–1.24)
GFR, Estimated: >60 mL/min (ref >60)
Glucose, Bld: 115 mg/dL — ABNORMAL HIGH (ref 70–99)
Potassium: 3.6 mmol/L (ref 3.5–5.1)
Sodium: 140 mmol/L (ref 135–145)

## 2021-10-10 LAB — MAGNESIUM: Magnesium: 1.8 mg/dL (ref 1.7–2.4)

## 2021-10-10 MED ORDER — PANTOPRAZOLE SODIUM 40 MG PO TBEC: 40.0000 mg | DELAYED_RELEASE_TABLET | Freq: Every day | ORAL | Status: DC

## 2021-10-10 MED ORDER — HYDROMORPHONE HCL 1 MG/ML IJ SOLN
1.0000 mg | Freq: Once | INTRAMUSCULAR | Status: AC
  Administered 2021-10-11: 1 mg via INTRAVENOUS
  Filled 2021-10-10: qty 1

## 2021-10-10 MED ORDER — GABAPENTIN 400 MG PO CAPS
800.0000 mg | ORAL_CAPSULE | Freq: Two times a day (BID) | ORAL | Status: DC
  Administered 2021-10-10 (×2): 800 mg via ORAL
  Filled 2021-10-10 (×2): qty 2

## 2021-10-10 MED ORDER — CYCLOBENZAPRINE HCL 10 MG PO TABS
10.0000 mg | ORAL_TABLET | Freq: Three times a day (TID) | ORAL | Status: DC | PRN
  Administered 2021-10-10 (×2): 10 mg via ORAL
  Filled 2021-10-10 (×2): qty 1

## 2021-10-10 MED ORDER — GABAPENTIN 800 MG PO TABS: 800.0000 mg | ORAL_TABLET | Freq: Two times a day (BID) | ORAL | Status: DC

## 2021-10-10 MED ORDER — DULOXETINE HCL 30 MG PO CPEP: 30.0000 mg | ORAL_CAPSULE | Freq: Every morning | ORAL | Status: DC

## 2021-10-10 MED ORDER — NICOTINE 21 MG/24HR TD PT24
21.0000 mg | MEDICATED_PATCH | Freq: Every day | TRANSDERMAL | Status: DC
  Administered 2021-10-10: 21 mg via TRANSDERMAL
  Filled 2021-10-10: qty 1

## 2021-10-10 MED ORDER — ZOLPIDEM TARTRATE 5 MG PO TABS
10.0000 mg | ORAL_TABLET | Freq: Every evening | ORAL | Status: DC | PRN
  Administered 2021-10-10: 10 mg via ORAL
  Filled 2021-10-10: qty 2

## 2021-10-10 MED ORDER — INSULIN ASPART 100 UNIT/ML IJ SOLN
0.0000 [IU] | Freq: Three times a day (TID) | INTRAMUSCULAR | Status: DC
  Administered 2021-10-10: 2 [IU] via SUBCUTANEOUS
  Administered 2021-10-10: 3 [IU] via SUBCUTANEOUS

## 2021-10-10 MED ORDER — KETOROLAC TROMETHAMINE 30 MG/ML IJ SOLN: 30.0000 mg | Freq: Four times a day (QID) | INTRAMUSCULAR | Status: DC | PRN

## 2021-10-10 NOTE — Progress Notes (Addendum)
[facility] Progress Note  Patient: Joshua Thomas     IRS:[id number]  DOA: 10/08/2021   PCP: Pcp, No       Brief hospital course: 46 y/o male with h/o lumbar osteomyelitis, Fentanyl and opioid use disorder, chronic Hep C presents with back pain to [facility] and was transferred to [facility] for ID and NS evaluation. [facility] and was released recently.  6/23-6/28> admitted for L5-S1 discitis/ osteomyelitis, IR attempted aspiration but sample was not sufficient and culture was negative. He finished 6 wks of Linezolid and Cipro early 09/2021.   In ED> given Vancomycin IV MRI> Discitis/ osteo L5-S1, endplate erosion has  progressed, epidural and prevertebral phlegmon with small epidural abscesses, progressive multifactorial L5-S1 left subarticular stenosis with posterior displacement of the descending left S1 nerve root, progressive multifactorial severe left neural foraminal narrowing.  Subjective:  Has lower back pain. Ran out of his Gabapentin about 5 days ago.  Assessment and Plan: Principal Problem:   Back pain - Vancomycin given on 9/28- will need to wait for IR guided biopsy next week - appreciate ID and NS evaluations - pain control with Gabapentin, Toradol, Lidoderm patch and Flexeril  Active Problems:     Essential hypertension - cont Clonidine    MDD (major depressive disorder), recurrent severe, without psychosis (HCC) - Prazosin, Cymbalta, Seroquel    Diabetes mellitus (HCC) - cont SSI Hemoglobin A1C    Component Value Date/Time   HGBA1C 7.1 (H) 10/08/2021 1959   Nicotine abuse - smoked 1 ppd - Nicotine patch ordered - Counseled    Opioid use disorder, severe, dependence (HCC)     Code Status: Full Code DVT prophylaxis:  SCDs Start: 10/08/21 2010   Consultants: ID, NS Level of Care: Level of care: Med-Surg  Objective:   Vitals:   10/09/21 2322 10/10/21 0348 10/10/21 0739 10/10/21 1117  BP: 128/86 125/83  (!) 158/100 (!) 137/91  Pulse: 73 (!) 52 (!) 59 (!) 59  Resp: 18 20  17   Temp: 97.9 F (36.6 C) 98.6 F (37 C) (!) 97.3 F (36.3 C) 97.8 F (36.6 C)  TempSrc: Oral Oral Oral Oral  SpO2: 100% 96% 99% 99%  Weight:      Height:       Filed Weights   10/08/21 1256  Weight: 74.8 kg   Exam: General exam: Appears comfortable  HEENT: oral mucosa moist Respiratory system: Clear to auscultation.  Cardiovascular system: S1 & S2 heard  Gastrointestinal system: Abdomen soft, non-tender, nondistended. Normal bowel sounds   Extremities: No cyanosis, clubbing or edema Psychiatry:  Mood & affect appropriate.    Imaging and lab data was personally reviewed    CBC: Recent Labs  Lab 10/08/21 1350 10/09/21 0346 10/10/21 0535  WBC 4.8 6.0 5.8  NEUTROABS 3.4  --   --   HGB 13.7 13.1 13.0  HCT 42.4 40.5 39.8  MCV 94.6 94.0 93.2  PLT 161 174 172   Basic Metabolic Panel: Recent Labs  Lab 10/08/21 1350 10/09/21 0346 10/10/21 0535  NA 138 135 140  K 3.6 4.3 3.6  CL 105 111 109  CO2 24 23 25   GLUCOSE 229* 310* 115*  BUN 9 21* 20  CREATININE 0.85 0.80 0.86  CALCIUM 8.8* 9.1 9.1  MG  --   --  1.8   GFR: Estimated Creatinine Clearance: 97.3 mL/min (by C-G formula based on SCr of 0.86 mg/dL).  Scheduled Meds:  cloNIDine  0.1 mg Oral QID   [START ON 10/11/2021] DULoxetine  30 mg Oral q morning   gabapentin  800 mg Oral BID   insulin aspart  0-15 Units Subcutaneous TID WC   lidocaine  2 patch Transdermal Q24H   melatonin  6 mg Oral QHS   nicotine  21 mg Transdermal Daily   prazosin  1 mg Oral QHS   QUEtiapine  100 mg Oral QHS   rOPINIRole  0.5 mg Oral QHS   Continuous Infusions:   LOS: 2 days   Author: Debbe Odea  10/10/2021 3:46 PM

## 2021-10-11 DIAGNOSIS — G062 Extradural and subdural abscess, unspecified: Secondary | ICD-10-CM | POA: Diagnosis not present

## 2021-10-11 LAB — GLUCOSE, CAPILLARY
Glucose-Capillary: 241 mg/dL — ABNORMAL HIGH (ref 70–99)
Glucose-Capillary: 167 mg/dL — ABNORMAL HIGH (ref 70–99)

## 2021-10-13 LAB — CULTURE, BLOOD (ROUTINE X 2)
Culture: NO GROWTH
Culture: NO GROWTH
Special Requests: ADEQUATE
Special Requests: ADEQUATE

## 2021-10-16 DIAGNOSIS — M5459 Other low back pain: Secondary | ICD-10-CM | POA: Diagnosis not present

## 2021-10-17 DIAGNOSIS — M4647 Discitis, unspecified, lumbosacral region: Secondary | ICD-10-CM | POA: Diagnosis not present

## 2021-10-17 DIAGNOSIS — M549 Dorsalgia, unspecified: Secondary | ICD-10-CM | POA: Diagnosis not present

## 2021-10-17 DIAGNOSIS — M8669 Other chronic osteomyelitis, multiple sites: Secondary | ICD-10-CM | POA: Diagnosis not present

## 2021-10-17 DIAGNOSIS — F1910 Other psychoactive substance abuse, uncomplicated: Secondary | ICD-10-CM | POA: Diagnosis not present

## 2021-10-17 DIAGNOSIS — G8929 Other chronic pain: Secondary | ICD-10-CM | POA: Diagnosis not present

## 2021-10-17 DIAGNOSIS — M4627 Osteomyelitis of vertebra, lumbosacral region: Secondary | ICD-10-CM | POA: Diagnosis not present

## 2021-10-17 DIAGNOSIS — Z794 Long term (current) use of insulin: Secondary | ICD-10-CM | POA: Diagnosis not present

## 2021-10-17 DIAGNOSIS — E1165 Type 2 diabetes mellitus with hyperglycemia: Secondary | ICD-10-CM | POA: Diagnosis not present

## 2021-10-17 DIAGNOSIS — E119 Type 2 diabetes mellitus without complications: Secondary | ICD-10-CM | POA: Diagnosis not present

## 2021-10-17 DIAGNOSIS — K4090 Unilateral inguinal hernia, without obstruction or gangrene, not specified as recurrent: Secondary | ICD-10-CM | POA: Diagnosis not present

## 2021-10-17 DIAGNOSIS — Z8619 Personal history of other infectious and parasitic diseases: Secondary | ICD-10-CM | POA: Diagnosis not present

## 2021-10-17 DIAGNOSIS — B1920 Unspecified viral hepatitis C without hepatic coma: Secondary | ICD-10-CM | POA: Diagnosis not present

## 2021-10-17 DIAGNOSIS — I10 Essential (primary) hypertension: Secondary | ICD-10-CM | POA: Diagnosis not present

## 2021-10-17 DIAGNOSIS — G062 Extradural and subdural abscess, unspecified: Secondary | ICD-10-CM | POA: Diagnosis not present

## 2021-10-17 DIAGNOSIS — R69 Illness, unspecified: Secondary | ICD-10-CM | POA: Diagnosis not present

## 2021-10-17 DIAGNOSIS — I4510 Unspecified right bundle-branch block: Secondary | ICD-10-CM | POA: Diagnosis not present

## 2021-10-17 DIAGNOSIS — M4626 Osteomyelitis of vertebra, lumbar region: Secondary | ICD-10-CM | POA: Diagnosis not present

## 2021-10-17 DIAGNOSIS — M5459 Other low back pain: Secondary | ICD-10-CM | POA: Diagnosis not present

## 2021-10-17 DIAGNOSIS — M545 Low back pain, unspecified: Secondary | ICD-10-CM | POA: Diagnosis not present

## 2021-10-17 DIAGNOSIS — Z87898 Personal history of other specified conditions: Secondary | ICD-10-CM | POA: Diagnosis not present

## 2021-10-17 DIAGNOSIS — F17210 Nicotine dependence, cigarettes, uncomplicated: Secondary | ICD-10-CM | POA: Diagnosis not present

## 2021-10-17 DIAGNOSIS — M4646 Discitis, unspecified, lumbar region: Secondary | ICD-10-CM | POA: Diagnosis not present

## 2021-10-17 DIAGNOSIS — M5417 Radiculopathy, lumbosacral region: Secondary | ICD-10-CM | POA: Diagnosis not present

## 2021-10-17 DIAGNOSIS — I493 Ventricular premature depolarization: Secondary | ICD-10-CM | POA: Diagnosis not present

## 2021-10-17 DIAGNOSIS — G061 Intraspinal abscess and granuloma: Secondary | ICD-10-CM | POA: Diagnosis not present

## 2021-10-17 DIAGNOSIS — E1169 Type 2 diabetes mellitus with other specified complication: Secondary | ICD-10-CM | POA: Diagnosis not present

## 2021-10-18 NOTE — Discharge Summary (Addendum)
Physician Discharge Summary  Joshua Thomas:[id number] DOB: 1975-05-24 DOA: 10/08/2021  PCP: Pcp, No  Admit date: 10/08/2021 Discharge date: 10/11/2021    This patient left the hospital AMA prior to being evaluated on 10/1.   Discharge Diagnoses:   Principal Problem:   Epidural abscess Active Problems:   Bipolar disorder ([place])   Acute osteomyelitis of lumbar spine ([place])   Essential hypertension   MDD (major depressive disorder), recurrent severe, without psychosis ([place])   Opioid use disorder, severe, dependence ([place])   Diabetes mellitus [facility])     Hospital Course:  46 y/o male with h/o lumbar osteomyelitis, Fentanyl and opioid use disorder, chronic Hep C presents with back pain to [facility] and was transferred to [facility] for ID and NS evaluation. [facility] and was released recently.  6/23-6/28> admitted for L5-S1 discitis/ osteomyelitis, IR attempted aspiration but sample was not sufficient and culture was negative. He finished 6 wks of Linezolid and Cipro early 09/2021.  In ED> given Vancomycin IV MRI> Discitis/ osteo L5-S1, endplate erosion has  progressed, epidural and prevertebral phlegmon with small epidural abscesses, progressive multifactorial L5-S1 left subarticular stenosis with posterior displacement of the descending left S1 nerve root, progressive multifactorial severe left neural foraminal narrowing.  Principal Problem:   Back pain -ID and NS evaluations requested and completed - Vancomycin given on 9/28- the plan was to wait for IR guided biopsy the next week- the patient was in agreement with this plan - He had recently received Naltrexone at the rehab ([facility])- we dicussed pain control together and decided on resuming his Gabapentin and continuing Toradol, Lidoderm patch and Flexeril   Active Problems:     Essential hypertension -   Clonidine     MDD (major depressive disorder), recurrent severe, without  psychosis ([place]) - Prazosin, Cymbalta, Seroquel     Diabetes mellitus ([place]) - he takes Metformin as outpt Hemoglobin A1C Labs (Brief)          Component Value Date/Time    HGBA1C 7.1 (H) 10/08/2021 1959      Nicotine abuse - smoked 1 ppd - Nicotine patch ordered and counseling done     Opioid use disorder, severe, dependence              Discharge Instructions   Allergies as of 10/11/2021       Reactions   Morphine Itching   Tylenol [acetaminophen] Other (See Comments)   Due to Ulcers/ Liver Damage        Medication List     ASK your doctor about these medications    acetaminophen 500 MG tablet Commonly known as: TYLENOL Take 1,000 mg by mouth every 6 (six) hours as needed.   ciprofloxacin 750 MG tablet Commonly known as: CIPRO Take 750 mg by mouth 2 (two) times daily.   cloNIDine 0.1 MG tablet Commonly known as: CATAPRES Take 0.1 mg by mouth 4 (four) times daily.   diclofenac Sodium 1 % Gel Commonly known as: VOLTAREN Apply 4 g topically 4 (four) times daily. Apply to affected areas 4 times daily as needed for pain.   DULoxetine 30 MG capsule Commonly known as: CYMBALTA Take 30 mg by mouth every morning. 8 am   gabapentin 800 MG tablet Commonly known as: Neurontin Take 1 tablet (800 mg total) by mouth 2 (two) times daily for 3 days.   ibuprofen 100 MG tablet Commonly known as: ADVIL Take 200 mg by mouth  every 6 (six) hours as needed for fever.   Insulin Aspart FlexPen 100 UNIT/ML Commonly known as: NOVOLOG Inject into the skin.   lactobacillus acidophilus Tabs tablet Take 2 tablets by mouth every evening.   lidocaine 5 % Commonly known as: LIDODERM Place 1 patch onto the skin daily. Remove & Discard patch within 12 hours or as directed by MD   linezolid 600 MG tablet Commonly known as: ZYVOX Take 600 mg by mouth 2 (two) times daily.   Melatonin 10 MG Tabs Take 10 mg by mouth at bedtime. 9pm   metFORMIN 750 MG 24 hr  tablet Commonly known as: GLUCOPHAGE-XR SMARTSIG:1 Tablet(s) By Mouth Every Evening   methocarbamol 500 MG tablet Commonly known as: ROBAXIN Take 1,000 mg by mouth every 6 (six) hours as needed for muscle spasms.   naloxone 4 MG/0.1ML Liqd nasal spray kit Commonly known as: NARCAN Take in case of overdose   naproxen sodium 220 MG tablet Commonly known as: ALEVE Take 220 mg by mouth daily as needed (pain).   prazosin 1 MG capsule Commonly known as: MINIPRESS Take 1 mg by mouth at bedtime.   QUEtiapine 100 MG tablet Commonly known as: SEROQUEL Take 100 mg by mouth at bedtime.   rOPINIRole 0.5 MG tablet Commonly known as: REQUIP Take 0.5 mg by mouth at bedtime.   TUMS PO Take 2-3 tablets by mouth as needed (gerd).            The results of significant diagnostics from this hospitalization (including imaging, microbiology, ancillary and laboratory) are listed below for reference.    MR Lumbar Spine W Wo Contrast (assess for abscess, cord compression)  Result Date: 10/08/2021 CLINICAL DATA:  Provided history: Low back pain, infection suspected, positive x-ray/CT. EXAM: MRI LUMBAR SPINE WITHOUT AND WITH CONTRAST TECHNIQUE: Multiplanar and multiecho pulse sequences of the lumbar spine were obtained without and with intravenous contrast. CONTRAST:  7.5 mL Vueway intravenous contrast. COMPARISON:  Lumbar spine MRI 07/04/2021. Lumbar spine radiographs 06/10/2021. FINDINGS: Segmentation: 5 lumbar vertebrae. The caudal most well-formed intervertebral disc space is designated L5-S1. Alignment: Dextrocurvature of the lumbar spine. 3 mm L1-L2 grade 1 retrolisthesis. Vertebrae: Persistent prominent marrow edema and enhancement within the L5 vertebral body, within the S1 body and extending into the left greater than right sacral alae. L5-S1 endplate erosion has progressed since the prior MRI of 07/04/2021. Progressive abnormal ventral epidural enhancement at this level, now measuring 8 mm in  AP dimension (previously measuring 5 mm). There are small foci of hypoenhancement centrally, and this likely reflects epidural phlegmon with superimposed small epidural abscesses. Persistent abnormal enhancement ventral to the L5-S1 disc space and L5 vertebral body, now measuring 6 mm in AP dimension. A small focus of internal hypoenhancement is present near the level of the disc space, in this likely reflects prevertebral phlegmon with a superimposed small abscess (series 10, image 5). Persistent more generalized paraspinal inflammation centered about the L5-S1 disc space with abnormal enhancement extending into the bilateral neural foramina. Redemonstrated sequelae of remote discitis/osteomyelitis at L2-L3 with fatty marrow conversion. Minimal endplate edema and enhancement at this level, similar to the prior examination and likely degenerative. Patchy fatty marrow conversion elsewhere within the lumbar and visualized lower thoracic spine. Unchanged chronic L1 superior endplate vertebral compression fracture with superimposed Schmorl node. Multilevel ventrolateral osteophytes. Conus medullaris and cauda equina: Conus extends to the L2 level. No signal abnormality identified within the visualized distal spinal cord. No pathologic enhancement of the visualized distal spinal cord  or cauda equina nerve roots. Paraspinal and other soft tissues: Paraspinal inflammation centered at the L5-S1 level, as described above. No acute abnormality identified within included portions of the abdomen/retroperitoneum. Disc levels: Unless otherwise stated, the level by level findings below have not significantly changed from the prior MRI of 07/04/2021. Multilevel disc height loss, greatest at T11-T12 (moderate), T12-L1 (moderate), L1-L2 (moderate), L2-L3 (severe) and L5-S1 (severe). T12-L1: No significant spinal canal or foraminal stenosis. L1-L2: Grade 1 retrolisthesis. Disc bulge asymmetric to the left with endplate osteophytes.  Superimposed small central disc protrusion. A shallow broad-based left subarticular/foraminal disc protrusion is also present. Facet arthrosis. The central disc protrusion results in mild relative narrowing of the central canal (without spinal cord mass effect). The left subarticular/foraminal disc protrusion contributes to mild left neural foraminal narrowing and contacts the undersurface of the exiting left L1 nerve root. L2-L3: Disc collapse with endplate remodeling. Facet arthrosis. Mild effacement of the ventral thecal sac (without significant subarticular or central canal stenosis). Mild relative bilateral neural foraminal narrowing L3-L4: Disc bulge. Superimposed broad-based left subarticular/foraminal disc protrusion with associated osteophyte ridge. Moderate facet arthrosis with ligamentum flavum hypertrophy. Minimal relative left subarticular narrowing (without nerve root impingement). Central canal patent. The disc protrusion contributes to mild-to-moderate left neural foraminal narrowing, and contacts the undersurface of the exiting left L3 nerve root. L4-L5: Slight disc bulge. Moderate facet arthrosis v with oice ligamentum flavum hypertrophy. No significant spinal canal stenosis. Mild relative bilateral neural foraminal narrowing. L5-S1: Disc bulging with endplate spurring and facet arthrosis. Sequelae of discitis/osteomyelitis, as described above. Progressive signal abnormality within the left center to left extraforaminal zones, likely reflecting a combination of herniated disc and epidural phlegmon/abscess, as well as paraspinal phlegmon. Resultant left subarticular stenosis with posterior displacement of the descending left S1 nerve root. No significant central canal stenosis. Severe left neural foraminal narrowing. These results will be called to the ordering clinician or representative by the Radiologist Assistant, and communication documented in the PACS or [employer]. IMPRESSION: Findings  of persistent active discitis/osteomyelitis at L5-S1, as detailed. Endplate erosion has progressed. Progressive ventral epidural enhancement, likely reflecting epidural phlegmon with superimposed small epidural abscesses. Prevertebral phlegmon and superimposed small prevertebral abscess also present at this level. Progressive multifactorial L5-S1 left subarticular stenosis with posterior displacement of the descending left S1 nerve root. Progressive multifactorial severe left neural foraminal narrowing. Redemonstrated sequelae of remote L2-L3 discitis/osteomyelitis. No convincing MR evidence of active infection at this level at this time. Electronically Signed   By: Kellie Simmering D.O.   On: 10/08/2021 15:42   Labs:   Basic Metabolic Panel: No results for input(s): "NA", "K", "CL", "CO2", "GLUCOSE", "BUN", "CREATININE", "CALCIUM", "MG", "PHOS" in the last 168 hours.   CBC: No results for input(s): "WBC", "NEUTROABS", "HGB", "HCT", "MCV", "PLT" in the last 168 hours.       SIGNED:   Debbe Odea, MD  [facility] 10/11/2021, 8:35 AM

## 2021-10-23 DIAGNOSIS — I10 Essential (primary) hypertension: Secondary | ICD-10-CM | POA: Diagnosis not present

## 2021-10-23 DIAGNOSIS — Z743 Need for continuous supervision: Secondary | ICD-10-CM | POA: Diagnosis not present

## 2021-10-23 DIAGNOSIS — M545 Low back pain, unspecified: Secondary | ICD-10-CM | POA: Diagnosis not present

## 2021-11-06 DIAGNOSIS — M545 Low back pain, unspecified: Secondary | ICD-10-CM | POA: Diagnosis not present

## 2021-11-06 DIAGNOSIS — M5416 Radiculopathy, lumbar region: Secondary | ICD-10-CM | POA: Diagnosis not present

## 2021-11-16 DIAGNOSIS — G062 Extradural and subdural abscess, unspecified: Secondary | ICD-10-CM | POA: Diagnosis not present

## 2021-11-16 DIAGNOSIS — E119 Type 2 diabetes mellitus without complications: Secondary | ICD-10-CM | POA: Diagnosis not present

## 2021-11-16 DIAGNOSIS — R69 Illness, unspecified: Secondary | ICD-10-CM | POA: Diagnosis not present

## 2021-11-16 DIAGNOSIS — M549 Dorsalgia, unspecified: Secondary | ICD-10-CM | POA: Diagnosis not present

## 2021-11-16 DIAGNOSIS — Z794 Long term (current) use of insulin: Secondary | ICD-10-CM | POA: Diagnosis not present

## 2021-12-15 ENCOUNTER — Emergency Department (HOSPITAL_COMMUNITY)
Admission: EM | Admit: 2021-12-15 | Discharge: 2021-12-15 | Disposition: A | Discharge status: Home / Self Care | Payer: 59 | Attending: Emergency Medicine | Admitting: Emergency Medicine

## 2021-12-15 ENCOUNTER — Encounter (HOSPITAL_COMMUNITY): Payer: Self-pay | Admitting: Emergency Medicine

## 2021-12-15 ENCOUNTER — Emergency Department (HOSPITAL_COMMUNITY): Payer: 59

## 2021-12-15 ENCOUNTER — Other Ambulatory Visit: Payer: Self-pay

## 2021-12-15 DIAGNOSIS — Z7984 Long term (current) use of oral hypoglycemic drugs: Secondary | ICD-10-CM | POA: Insufficient documentation

## 2021-12-15 DIAGNOSIS — X58XXXA Exposure to other specified factors, initial encounter: Secondary | ICD-10-CM | POA: Insufficient documentation

## 2021-12-15 DIAGNOSIS — E119 Type 2 diabetes mellitus without complications: Secondary | ICD-10-CM | POA: Diagnosis not present

## 2021-12-15 DIAGNOSIS — Z79899 Other long term (current) drug therapy: Secondary | ICD-10-CM | POA: Diagnosis not present

## 2021-12-15 DIAGNOSIS — Z794 Long term (current) use of insulin: Secondary | ICD-10-CM | POA: Insufficient documentation

## 2021-12-15 DIAGNOSIS — K4090 Unilateral inguinal hernia, without obstruction or gangrene, not specified as recurrent: Secondary | ICD-10-CM | POA: Diagnosis not present

## 2021-12-15 DIAGNOSIS — S32010A Wedge compression fracture of first lumbar vertebra, initial encounter for closed fracture: Secondary | ICD-10-CM | POA: Diagnosis not present

## 2021-12-15 DIAGNOSIS — J449 Chronic obstructive pulmonary disease, unspecified: Secondary | ICD-10-CM | POA: Insufficient documentation

## 2021-12-15 DIAGNOSIS — I10 Essential (primary) hypertension: Secondary | ICD-10-CM | POA: Insufficient documentation

## 2021-12-15 DIAGNOSIS — M4626 Osteomyelitis of vertebra, lumbar region: Secondary | ICD-10-CM | POA: Diagnosis not present

## 2021-12-15 DIAGNOSIS — M419 Scoliosis, unspecified: Secondary | ICD-10-CM | POA: Diagnosis not present

## 2021-12-15 DIAGNOSIS — S39012A Strain of muscle, fascia and tendon of lower back, initial encounter: Secondary | ICD-10-CM | POA: Insufficient documentation

## 2021-12-15 DIAGNOSIS — S3992XA Unspecified injury of lower back, initial encounter: Secondary | ICD-10-CM | POA: Diagnosis not present

## 2021-12-15 DIAGNOSIS — M4649 Discitis, unspecified, multiple sites in spine: Secondary | ICD-10-CM | POA: Diagnosis not present

## 2021-12-15 MED ORDER — OXYCODONE HCL 5 MG PO TABS: 5.0000 mg | ORAL_TABLET | ORAL | 0 refills | Status: DC | PRN

## 2021-12-15 MED ORDER — CYCLOBENZAPRINE HCL 5 MG PO TABS: 5.0000 mg | ORAL_TABLET | Freq: Three times a day (TID) | ORAL | 0 refills | Status: DC | PRN

## 2021-12-15 MED ORDER — OXYCODONE HCL 5 MG PO TABS: 5.0000 mg | ORAL_TABLET | Freq: Once | ORAL | Status: DC

## 2021-12-15 MED ORDER — KETOROLAC TROMETHAMINE 15 MG/ML IJ SOLN
15.0000 mg | Freq: Once | INTRAMUSCULAR | Status: AC
  Administered 2021-12-15: 15 mg via INTRAMUSCULAR
  Filled 2021-12-15: qty 1
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
## 2021-12-16 ENCOUNTER — Telehealth: Payer: Self-pay

## 2021-12-16 NOTE — Patient Outreach (Signed)
  Care Coordination TOC Note Transition Care Management Unsuccessful Follow-up Telephone Call  Date of discharge and from where:  [facility] 12/15/21  Attempts:  1st Attempt- for transition of care and see if patient needs assistance finding a PCP.  Reason for unsuccessful TCM follow-up call:  No answer/busy  Jodelle Gross, RN, BSN, CCM Care Management Coordinator [facility] Phone: [phone]/Fax: [phone]

## 2021-12-22 DIAGNOSIS — I10 Essential (primary) hypertension: Secondary | ICD-10-CM | POA: Diagnosis not present

## 2021-12-22 DIAGNOSIS — F1320 Sedative, hypnotic or anxiolytic dependence, uncomplicated: Secondary | ICD-10-CM | POA: Diagnosis not present

## 2021-12-22 DIAGNOSIS — R69 Illness, unspecified: Secondary | ICD-10-CM | POA: Diagnosis not present

## 2022-01-05 DIAGNOSIS — R69 Illness, unspecified: Secondary | ICD-10-CM | POA: Diagnosis not present

## 2022-01-05 DIAGNOSIS — I10 Essential (primary) hypertension: Secondary | ICD-10-CM | POA: Diagnosis not present

## 2022-01-05 DIAGNOSIS — F1320 Sedative, hypnotic or anxiolytic dependence, uncomplicated: Secondary | ICD-10-CM | POA: Diagnosis not present

## 2022-01-12 DIAGNOSIS — F1320 Sedative, hypnotic or anxiolytic dependence, uncomplicated: Secondary | ICD-10-CM | POA: Diagnosis not present

## 2022-01-12 DIAGNOSIS — R69 Illness, unspecified: Secondary | ICD-10-CM | POA: Diagnosis not present

## 2022-01-12 DIAGNOSIS — I10 Essential (primary) hypertension: Secondary | ICD-10-CM | POA: Diagnosis not present

## 2022-01-12 DIAGNOSIS — E119 Type 2 diabetes mellitus without complications: Secondary | ICD-10-CM | POA: Diagnosis not present

## 2022-02-19 ENCOUNTER — Emergency Department (HOSPITAL_COMMUNITY): Payer: Medicaid Other

## 2022-02-19 ENCOUNTER — Encounter (HOSPITAL_COMMUNITY): Payer: Self-pay

## 2022-02-19 ENCOUNTER — Other Ambulatory Visit: Payer: Self-pay

## 2022-02-19 ENCOUNTER — Emergency Department (HOSPITAL_COMMUNITY)
Admission: EM | Admit: 2022-02-19 | Discharge: 2022-02-20 | Disposition: A | Discharge status: Home / Self Care | Payer: Medicaid Other | Attending: Emergency Medicine | Admitting: Emergency Medicine

## 2022-02-19 DIAGNOSIS — T40601A Poisoning by unspecified narcotics, accidental (unintentional), initial encounter: Secondary | ICD-10-CM

## 2022-02-19 DIAGNOSIS — T402X1A Poisoning by other opioids, accidental (unintentional), initial encounter: Secondary | ICD-10-CM | POA: Insufficient documentation

## 2022-02-19 LAB — CBC WITH DIFFERENTIAL/PLATELET
Abs Immature Granulocytes: 0.01 10*3/uL (ref 0.00–0.07)
Basophils Absolute: 0 10*3/uL (ref 0.0–0.1)
Basophils Relative: 0 %
Eosinophils Absolute: 0.1 10*3/uL (ref 0.0–0.5)
Eosinophils Relative: 3 %
HCT: 42.3 % (ref 39.0–52.0)
Hemoglobin: 13.4 g/dL (ref 13.0–17.0)
Immature Granulocytes: 0 %
Lymphocytes Relative: 30 %
Lymphs Abs: 1.4 10*3/uL (ref 0.7–4.0)
MCH: 29.9 pg (ref 26.0–34.0)
MCHC: 31.7 g/dL (ref 30.0–36.0)
MCV: 94.4 fL (ref 80.0–100.0)
Monocytes Absolute: 0.4 10*3/uL (ref 0.1–1.0)
Monocytes Relative: 8 %
Neutro Abs: 2.7 10*3/uL (ref 1.7–7.7)
Neutrophils Relative %: 59 %
Platelets: 109 10*3/uL — ABNORMAL LOW (ref 150–400)
RBC: 4.48 MIL/uL (ref 4.22–5.81)
RDW: 14.6 % (ref 11.5–15.5)
WBC: 4.5 10*3/uL (ref 4.0–10.5)
nRBC: 0 % (ref 0.0–0.2)

## 2022-02-19 MED ORDER — NALOXONE HCL 2 MG/2ML IJ SOSY
1.0000 mg | PREFILLED_SYRINGE | Freq: Once | INTRAMUSCULAR | Status: AC
  Administered 2022-02-19: 1 mg via INTRAVENOUS
  Filled 2022-02-19: qty 2

## 2022-02-19 MED ORDER — NALOXONE HCL 2 MG/2ML IJ SOSY
2.0000 mg | PREFILLED_SYRINGE | Freq: Once | INTRAMUSCULAR | Status: DC
  Filled 2022-02-19: qty 2

## 2022-02-19 NOTE — ED Notes (Signed)
administered 83m of narcan as prescribed. RR 7
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
## 2022-02-20 ENCOUNTER — Emergency Department (HOSPITAL_COMMUNITY): Payer: Medicaid Other

## 2022-02-20 LAB — COMPREHENSIVE METABOLIC PANEL
ALT: 24 U/L (ref 0–44)
AST: 24 U/L (ref 15–41)
Albumin: 4.2 g/dL (ref 3.5–5.0)
Alkaline Phosphatase: 38 U/L (ref 38–126)
Anion gap: 7 (ref 5–15)
BUN: 11 mg/dL (ref 6–20)
CO2: 25 mmol/L (ref 22–32)
Calcium: 8.5 mg/dL — ABNORMAL LOW (ref 8.9–10.3)
Chloride: 104 mmol/L (ref 98–111)
Creatinine, Ser: 1.1 mg/dL (ref 0.61–1.24)
GFR, Estimated: >60 mL/min (ref >60)
Glucose, Bld: 169 mg/dL — ABNORMAL HIGH (ref 70–99)
Potassium: 4.4 mmol/L (ref 3.5–5.1)
Sodium: 136 mmol/L (ref 135–145)
Total Bilirubin: 0.5 mg/dL (ref 0.3–1.2)
Total Protein: 7.9 g/dL (ref 6.5–8.1)

## 2022-02-20 LAB — AMMONIA: Ammonia: 58 umol/L — ABNORMAL HIGH (ref 9–35)

## 2022-02-20 LAB — RAPID URINE DRUG SCREEN, HOSP PERFORMED
Amphetamines: NOT DETECTED
Barbiturates: NOT DETECTED
Benzodiazepines: POSITIVE — AB
Cocaine: POSITIVE — AB
Opiates: NOT DETECTED
Tetrahydrocannabinol: NOT DETECTED

## 2022-02-20 LAB — ETHANOL: Alcohol, Ethyl (B): <10 mg/dL (ref <10)

## 2022-02-20 LAB — CK: Total CK: 43 U/L — ABNORMAL LOW (ref 49–397)

## 2022-02-20 NOTE — ED Provider Notes (Signed)
Patient seen after prior ED provider.  Patient now is alert.  He is answering questions appropriately.  He appears to be comfortable.  He is eating breakfast.  Patient is advised that continued substance abuse could result in his death.  He understands that he received Narcan after near respiratory arrest subsequent to narcotic overdose.  Patient is appropriate for discharge.  Patient understands need for close outpatient follow-up.  Strict return precautions given and understood.     Valarie Merino, MD 02/20/22 1012

## 2022-02-20 NOTE — ED Notes (Addendum)
Pt seen with drugs in hand RN notified, unknown if PT consumed some. Security notified and disposed of.

## 2022-02-20 NOTE — ED Notes (Signed)
Pt belongings in cabinets in section 13[address]18.

## 2022-02-20 NOTE — ED Notes (Signed)
Pt will not keep his chest leads on even thought he was informed of the importance of having them on. RN notified.

## 2022-02-20 NOTE — ED Notes (Signed)
CT to come get patient but too drowsy to be transported at this time RN will call CT when patient is more alert

## 2022-02-20 NOTE — Discharge Instructions (Addendum)
Return for any problem.  ?

## 2022-02-20 NOTE — ED Notes (Signed)
Patient D/C, 1 bag of personal items returned to patient

## 2022-02-20 NOTE — ED Notes (Signed)
Patient is alert to voice, RN went to assess patient he is non compliant with leaving on EKG leads, O2 monitor or BP cuff. He is not displaying any signs of respiratory distress, SOB or pain/discomfort at this time. Nutrition offered to patient he is currently sitting on side of bed eating at this time.
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
## 2022-03-17 ENCOUNTER — Other Ambulatory Visit (HOSPITAL_COMMUNITY): Payer: Self-pay | Admitting: Family Medicine

## 2022-03-17 DIAGNOSIS — R0600 Dyspnea, unspecified: Secondary | ICD-10-CM

## 2022-03-24 ENCOUNTER — Encounter: Payer: Self-pay | Admitting: *Deleted

## 2022-05-07 ENCOUNTER — Other Ambulatory Visit: Payer: Self-pay | Admitting: *Deleted

## 2022-05-07 DIAGNOSIS — K4090 Unilateral inguinal hernia, without obstruction or gangrene, not specified as recurrent: Secondary | ICD-10-CM

## 2022-05-11 ENCOUNTER — Ambulatory Visit (INDEPENDENT_AMBULATORY_CARE_PROVIDER_SITE_OTHER): Payer: Medicaid Other | Admitting: General Surgery

## 2022-05-11 ENCOUNTER — Encounter: Payer: Self-pay | Admitting: General Surgery

## 2022-05-11 VITALS — BP 137/91 | HR 102 | Temp 97.9°F | Resp 14 | Ht 63.0 in | Wt 163.0 lb

## 2022-05-11 DIAGNOSIS — K4020 Bilateral inguinal hernia, without obstruction or gangrene, not specified as recurrent: Secondary | ICD-10-CM | POA: Diagnosis not present

## 2022-05-12 NOTE — H&P (Signed)
Joshua Thomas; [id number]; [birth year]   HPI Patient is a 47 year old white male who was referred to my care by Dr. Nita Sells for evaluation and treatment of a right inguinal hernia.  He has had bilateral inguinal hernias for some time, but he has started having significant right groin pain.  He does reduce the hernia on his own.  It is made worse with straining.  It is starting to affect his daily life style.  He has taken himself off Suboxone in preparation for surgery.  He denies any nausea or vomiting. 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   COPD (chronic obstructive pulmonary disease) (HCC)    DDD (degenerative disc disease), lumbar    Degenerative disc disease, lumbar    Depression    Discitis    GERD (gastroesophageal reflux disease)    Hepatitis C    Hepatitis C    History of stomach ulcers    Hx of diabetes mellitus    due to infection   Hypertension    Lung nodules    3 on L and 2 on R   PTSD (post-traumatic stress disorder)    Sciatica    Substance abuse (HCC)     Past Surgical History:  Procedure Laterality Date   FRACTURE SURGERY     HARDWARE REMOVAL  01/27/2012   Procedure: HARDWARE REMOVAL;  Surgeon: Kathryne Hitch, MD;  Location: [facility];  Service: Orthopedics;  Laterality: Left;   IR LUMBAR DISC ASPIRATION W/IMG GUIDE  06/08/2017   IR LUMBAR DISC ASPIRATION W/IMG GUIDE  07/06/2021   RADIOLOGY WITH ANESTHESIA N/A 06/08/2017   Procedure: DISC ASPIRATION;  Surgeon: Julieanne Cotton, MD;  Location: [facility];  Service: Radiology;  Laterality: N/A;   TIBIA IM NAIL INSERTION  01/27/2012   Procedure: INTRAMEDULLARY (IM) NAIL TIBIAL;  Surgeon: Kathryne Hitch, MD;  Location: [facility];  Service: Orthopedics;  Laterality: Left;  Removal of IM Rod and Screws Left Tibia  with Exchange Nail, Allograft Bone Graft Left Tibia    Family History  Problem Relation Age of Onset   Diabetes Mother    Alcohol abuse Father    Cirrhosis Father    Aneurysm Father     Current Outpatient Medications on File Prior to Visit  Medication Sig Dispense Refill   acetaminophen (TYLENOL) 500 MG tablet Take 1,000 mg by mouth every 6 (six) hours as needed.     Buprenorphine HCl-Naloxone HCl (SUBOXONE) 8-2 MG FILM Place under the tongue.     Calcium Carbonate Antacid (TUMS PO) Take 2-3 tablets by mouth as needed (gerd).     cloNIDine (CATAPRES) 0.1 MG tablet Take 0.1 mg by mouth 4 (four) times daily.     cyclobenzaprine (FLEXERIL) 5 MG tablet Take 1 tablet (5 mg total) by mouth 3 (three) times daily as needed for muscle spasms. 8 tablet 0   divalproex (DEPAKOTE) 250 MG DR tablet Take 250 mg by mouth at bedtime.     ibuprofen (ADVIL) 100 MG tablet Take 200 mg by mouth every 6 (six) hours as needed for fever.     lidocaine (LIDODERM) 5 % Place 1 patch onto the skin daily. Remove & Discard patch within 12 hours or as directed by MD 30 patch 0  Melatonin 10 MG TABS Take 10 mg by mouth at bedtime. 9pm     metFORMIN (GLUCOPHAGE-XR) 750 MG 24 hr tablet SMARTSIG:1 Tablet(s) By Mouth Every Evening     prazosin (MINIPRESS) 1 MG capsule Take 1 mg by mouth at bedtime.     QUEtiapine (SEROQUEL) 100 MG tablet Take 100 mg by mouth at bedtime.     rOPINIRole (REQUIP) 0.5 MG tablet Take 0.5 mg by mouth at bedtime.     VRAYLAR 3 MG capsule Take 3 mg by mouth daily.     naloxone (NARCAN) nasal spray 4 mg/0.1 mL Take in case of overdose (Patient not taking: Reported on 05/11/2022) 1 each 0   naproxen sodium (ALEVE) 220 MG tablet Take 220 mg by mouth daily as needed (pain). (Patient not taking: Reported on 05/11/2022)     oxyCODONE (ROXICODONE) 5 MG immediate release tablet Take 1 tablet (5 mg total) by mouth every 4 (four) hours as needed for severe pain. (Patient not taking: Reported on 05/11/2022) 6  tablet 0   No current facility-administered medications on file prior to visit.    Allergies  Allergen Reactions   Morphine Itching   Tylenol [Acetaminophen] Other (See Comments)    Due to Ulcers/ Liver Damage    Social History   Substance and Sexual Activity  Alcohol Use Not Currently   Alcohol/week: 1.0 standard drink of alcohol   Types: 1 Standard drinks or equivalent per week   Comment: last used July 2023    Social History   Tobacco Use  Smoking Status Every Day   Packs/day: 1.00   Years: 33.00   Additional pack years: 0.00   Total pack years: 33.00   Types: Cigarettes  Smokeless Tobacco Former   Quit date: 08/08/2006  Tobacco Comments   1 PPD    Review of Systems  Constitutional:  Positive for malaise/fatigue.  HENT:  Positive for ear pain, sinus pain and sore throat.   Respiratory:  Positive for shortness of breath.   Cardiovascular: Negative.   Gastrointestinal:  Positive for abdominal pain and heartburn.  Genitourinary:  Positive for dysuria and urgency.  Musculoskeletal: Negative.   Skin: Negative.   Neurological: Negative.   Endo/Heme/Allergies: Negative.   Psychiatric/Behavioral:  The patient is nervous/anxious.     Objective   Vitals:   05/11/22 1502  BP: (!) 137/91  Pulse: (!) 102  Resp: 14  Temp: 97.9 F (36.6 C)  SpO2: 96%    Physical Exam Vitals reviewed.  Constitutional:      Appearance: Normal appearance. He is normal weight. He is not ill-appearing.  HENT:     Head: Normocephalic and atraumatic.  Cardiovascular:     Rate and Rhythm: Normal rate and regular rhythm.     Heart sounds: Normal heart sounds. No murmur heard.    No friction rub. No gallop.  Pulmonary:     Effort: Pulmonary effort is normal. No respiratory distress.     Breath sounds: Normal breath sounds. No stridor. No wheezing, rhonchi or rales.  Abdominal:     General: Bowel sounds are normal. There is no distension.     Palpations: Abdomen is soft. There is  no mass.     Tenderness: There is no abdominal tenderness. There is no guarding or rebound.     Hernia: A hernia is present.     Comments: Patient has bilateral inguinal hernias, right greater than left.  He does have tenderness over both internal rings.  Genitourinary:    Testes:  Normal.  Skin:    General: Skin is warm and dry.  Neurological:     Mental Status: He is alert and oriented to person, place, and time.    Primary care notes reviewed Assessment  Bilateral inguinal hernias Plan  Patient is scheduled for a robotic assisted bilateral inguinal herniorrhaphies with mesh on 05/24/2022.  The risks and benefits of the procedure including bleeding, infection, recurrence, bowel injury, and the possibility of an open procedure were fully explained to the patient, who gave informed consent.

## 2022-05-12 NOTE — Progress Notes (Signed)
Joshua Thomas; [id number]; 10/07/1975   HPI Patient is a 47-year-old white male who was referred to my care by Dr. John Hall for evaluation and treatment of a right inguinal hernia.  He has had bilateral inguinal hernias for some time, but he has started having significant right groin pain.  He does reduce the hernia on his own.  It is made worse with straining.  It is starting to affect his daily life style.  He has taken himself off Suboxone in preparation for surgery.  He denies any nausea or vomiting. 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   COPD (chronic obstructive pulmonary disease) (HCC)    DDD (degenerative disc disease), lumbar    Degenerative disc disease, lumbar    Depression    Discitis    GERD (gastroesophageal reflux disease)    Hepatitis C    Hepatitis C    History of stomach ulcers    Hx of diabetes mellitus    due to infection   Hypertension    Lung nodules    3 on L and 2 on R   PTSD (post-traumatic stress disorder)    Sciatica    Substance abuse (HCC)     Past Surgical History:  Procedure Laterality Date   FRACTURE SURGERY     HARDWARE REMOVAL  01/27/2012   Procedure: HARDWARE REMOVAL;  Surgeon: Christopher Y Blackman, MD;  Location: [facility];  Service: Orthopedics;  Laterality: Left;   IR LUMBAR DISC ASPIRATION W/IMG GUIDE  06/08/2017   IR LUMBAR DISC ASPIRATION W/IMG GUIDE  07/06/2021   RADIOLOGY WITH ANESTHESIA N/A 06/08/2017   Procedure: DISC ASPIRATION;  Surgeon: Deveshwar, Sanjeev, MD;  Location: [facility];  Service: Radiology;  Laterality: N/A;   TIBIA IM NAIL INSERTION  01/27/2012   Procedure: INTRAMEDULLARY (IM) NAIL TIBIAL;  Surgeon: Christopher Y Blackman, MD;  Location: [facility];  Service: Orthopedics;  Laterality: Left;  Removal of IM Rod and Screws Left Tibia  with Exchange Nail, Allograft Bone Graft Left Tibia    Family History  Problem Relation Age of Onset   Diabetes Mother    Alcohol abuse Father    Cirrhosis Father    Aneurysm Father     Current Outpatient Medications on File Prior to Visit  Medication Sig Dispense Refill   acetaminophen (TYLENOL) 500 MG tablet Take 1,000 mg by mouth every 6 (six) hours as needed.     Buprenorphine HCl-Naloxone HCl (SUBOXONE) 8-2 MG FILM Place under the tongue.     Calcium Carbonate Antacid (TUMS PO) Take 2-3 tablets by mouth as needed (gerd).     cloNIDine (CATAPRES) 0.1 MG tablet Take 0.1 mg by mouth 4 (four) times daily.     cyclobenzaprine (FLEXERIL) 5 MG tablet Take 1 tablet (5 mg total) by mouth 3 (three) times daily as needed for muscle spasms. 8 tablet 0   divalproex (DEPAKOTE) 250 MG DR tablet Take 250 mg by mouth at bedtime.     ibuprofen (ADVIL) 100 MG tablet Take 200 mg by mouth every 6 (six) hours as needed for fever.     lidocaine (LIDODERM) 5 % Place 1 patch onto the skin daily. Remove & Discard patch within 12 hours or as directed by MD 30 patch 0     Melatonin 10 MG TABS Take 10 mg by mouth at bedtime. 9pm     metFORMIN (GLUCOPHAGE-XR) 750 MG 24 hr tablet SMARTSIG:1 Tablet(s) By Mouth Every Evening     prazosin (MINIPRESS) 1 MG capsule Take 1 mg by mouth at bedtime.     QUEtiapine (SEROQUEL) 100 MG tablet Take 100 mg by mouth at bedtime.     rOPINIRole (REQUIP) 0.5 MG tablet Take 0.5 mg by mouth at bedtime.     VRAYLAR 3 MG capsule Take 3 mg by mouth daily.     naloxone (NARCAN) nasal spray 4 mg/0.1 mL Take in case of overdose (Patient not taking: Reported on 05/11/2022) 1 each 0   naproxen sodium (ALEVE) 220 MG tablet Take 220 mg by mouth daily as needed (pain). (Patient not taking: Reported on 05/11/2022)     oxyCODONE (ROXICODONE) 5 MG immediate release tablet Take 1 tablet (5 mg total) by mouth every 4 (four) hours as needed for severe pain. (Patient not taking: Reported on 05/11/2022) 6  tablet 0   No current facility-administered medications on file prior to visit.    Allergies  Allergen Reactions   Morphine Itching   Tylenol [Acetaminophen] Other (See Comments)    Due to Ulcers/ Liver Damage    Social History   Substance and Sexual Activity  Alcohol Use Not Currently   Alcohol/week: 1.0 standard drink of alcohol   Types: 1 Standard drinks or equivalent per week   Comment: last used July 2023    Social History   Tobacco Use  Smoking Status Every Day   Packs/day: 1.00   Years: 33.00   Additional pack years: 0.00   Total pack years: 33.00   Types: Cigarettes  Smokeless Tobacco Former   Quit date: 08/08/2006  Tobacco Comments   1 PPD    Review of Systems  Constitutional:  Positive for malaise/fatigue.  HENT:  Positive for ear pain, sinus pain and sore throat.   Respiratory:  Positive for shortness of breath.   Cardiovascular: Negative.   Gastrointestinal:  Positive for abdominal pain and heartburn.  Genitourinary:  Positive for dysuria and urgency.  Musculoskeletal: Negative.   Skin: Negative.   Neurological: Negative.   Endo/Heme/Allergies: Negative.   Psychiatric/Behavioral:  The patient is nervous/anxious.     Objective   Vitals:   05/11/22 1502  BP: (!) 137/91  Pulse: (!) 102  Resp: 14  Temp: 97.9 F (36.6 C)  SpO2: 96%    Physical Exam Vitals reviewed.  Constitutional:      Appearance: Normal appearance. He is normal weight. He is not ill-appearing.  HENT:     Head: Normocephalic and atraumatic.  Cardiovascular:     Rate and Rhythm: Normal rate and regular rhythm.     Heart sounds: Normal heart sounds. No murmur heard.    No friction rub. No gallop.  Pulmonary:     Effort: Pulmonary effort is normal. No respiratory distress.     Breath sounds: Normal breath sounds. No stridor. No wheezing, rhonchi or rales.  Abdominal:     General: Bowel sounds are normal. There is no distension.     Palpations: Abdomen is soft. There is  no mass.     Tenderness: There is no abdominal tenderness. There is no guarding or rebound.     Hernia: A hernia is present.     Comments: Patient has bilateral inguinal hernias, right greater than left.  He does have tenderness over both internal rings.  Genitourinary:    Testes:   Normal.  Skin:    General: Skin is warm and dry.  Neurological:     Mental Status: He is alert and oriented to person, place, and time.    Primary care notes reviewed Assessment  Bilateral inguinal hernias Plan  Patient is scheduled for a robotic assisted bilateral inguinal herniorrhaphies with mesh on 05/24/2022.  The risks and benefits of the procedure including bleeding, infection, recurrence, bowel injury, and the possibility of an open procedure were fully explained to the patient, who gave informed consent. 

## 2022-05-19 NOTE — Patient Instructions (Signed)
Joshua Thomas  05/19/2022     @PREFPERIOPPHARMACY @   Your procedure is scheduled on  05/24/2022.   Report to [facility] at  0740  A.M.   Call this number if you have problems the morning of surgery:  [phone]  If you experience any cold or flu symptoms such as cough, fever, chills, shortness of breath, etc. between now and your scheduled surgery, please notify us at the above number.   Remember:  Do not eat or drink after midnight.      Take these medicines the morning of surgery with A SIP OF WATER        suboxone, clonidine, flexeril(if needed), vraylar.     Do not wear jewelry, make-up or nail polish.  Do not wear lotions, powders, or perfumes, or deodorant.  Do not shave 48 hours prior to surgery.  Men may shave face and neck.  Do not bring valuables to the hospital.  [facility] is not responsible for any belongings or valuables.  Contacts, dentures or bridgework may not be worn into surgery.  Leave your suitcase in the car.  After surgery it may be brought to your room.  For patients admitted to the hospital, discharge time will be determined by your treatment team.  Patients discharged the day of surgery will not be allowed to drive home and must have someone with them for 24 hours.    Special instructions:   DO NOT smoke tobacco or vape for 24 hours before your procedure.  Please read over the following fact sheets that you were given. Pain Booklet, Coughing and Deep Breathing, Surgical Site Infection Prevention, Anesthesia Post-op Instructions, and Care and Recovery After Surgery      Laparoscopic Inguinal Hernia Repair, Adult, Care After The following information offers guidance on how to care for yourself after your procedure. Your health care provider may also give you more specific instructions. If you have problems or questions, contact your health care provider. What can I expect after the procedure? After the procedure, it is common to  have: Pain. Swelling and bruising around the incision area. Scrotal swelling, in males. Some fluid or blood draining from your incisions. Follow these instructions at home: Medicines Take over-the-counter and prescription medicines only as told by your health care provider. Ask your health care provider if the medicine prescribed to you: Requires you to avoid driving or using machinery. Can cause constipation. You may need to take these actions to prevent or treat constipation: Drink enough fluid to keep your urine pale yellow. Take over-the-counter or prescription medicines. Eat foods that are high in fiber, such as beans, whole grains, and fresh fruits and vegetables. Limit foods that are high in fat and processed sugars, such as fried or sweet foods. Incision care  Follow instructions from your health care provider about how to take care of your incisions. Make sure you: Wash your hands with soap and water for at least 20 seconds before and after you change your bandage (dressing). If soap and water are not available, use hand sanitizer. Change your dressing as told by your health care provider. Leave stitches (sutures), skin glue, or adhesive strips in place. These skin closures may need to stay in place for 2 weeks or longer. If adhesive strip edges start to loosen and curl up, you may trim the loose edges. Do not remove adhesive strips completely unless your health care provider tells you to do that. Check your incision  area every day for signs of infection. Check for: More redness, swelling, or pain. More fluid or blood. Warmth. Pus or a bad smell. Wear loose, soft clothing while your incisions heal. Managing pain and swelling If directed, put ice on the painful or swollen areas. To do this: Put ice in a plastic bag. Place a towel between your skin and the bag. Leave the ice on for 20 minutes, 2-3 times a day. Remove the ice if your skin turns bright red. This is very  important. If you cannot feel pain, heat, or cold, you have a greater risk of damage to the area.  Activity Do not lift anything that is heavier than 10 lb (4.5 kg), or the limit that you are told, until your health care provider says that it is safe. Ask your health care provider what activities are safe for you. A lot of activity during the first week after surgery can increase pain and swelling. For 1 week after your procedure: Avoid activities that take a lot of effort, such as exercise or sports. You may walk and climb stairs as needed for daily activity, but avoid long walks or climbing stairs for exercise. General instructions If you were given a sedative during the procedure, it can affect you for several hours. Do not drive or operate machinery until your health care provider says that it is safe. Do not take baths, swim, or use a hot tub until your health care provider approves. Ask your health care provider if you may take showers. You may only be allowed to take sponge baths. Do not use any products that contain nicotine or tobacco. These products include cigarettes, chewing tobacco, and vaping devices, such as e-cigarettes. If you need help quitting, ask your health care provider. Keep all follow-up visits. This is important. Contact a health care provider if: You have any of these signs of infection: More redness, swelling, or pain around your incisions or your groin area. More fluid or blood coming from an incision. Warmth coming from an incision. Pus or a bad smell coming from an incision. A fever or chills. You have more swelling in your scrotum, if you are male. You have severe pain and medicines do not help. You have abdominal pain or swelling. You cannot urinate or have a bowel movement. You faint or feel dizzy. You have nausea and vomiting. Get help right away if: You have redness, warmth, or pain in your leg. You have chest pain. You have problems breathing. These  symptoms may represent a serious problem that is an emergency. Do not wait to see if the symptoms will go away. Get medical help right away. Call your local emergency services (911 in the [place] Do not drive yourself to the hospital. Summary Pain, swelling, and bruising are common after the procedure. Check your incision area every day for signs of infection, such as more redness, swelling, or pain. Put ice on painful or swollen areas for 20 minutes, 2-3 times a day. This information is not intended to replace advice given to you by your health care provider. Make sure you discuss any questions you have with your health care provider. Document Revised: 08/28/2019 Document Reviewed: 08/28/2019 Elsevier Patient Education  2023 [employer]. General Anesthesia, Adult, Care After The following information offers guidance on how to care for yourself after your procedure. Your health care provider may also give you more specific instructions. If you have problems or questions, contact your health care provider. What can I  expect after the procedure? After the procedure, it is common for people to: Have pain or discomfort at the IV site. Have nausea or vomiting. Have a sore throat or hoarseness. Have trouble concentrating. Feel cold or chills. Feel weak, sleepy, or tired (fatigue). Have soreness and body aches. These can affect parts of the body that were not involved in surgery. Follow these instructions at home: For the time period you were told by your health care provider:  Rest. Do not participate in activities where you could fall or become injured. Do not drive or use machinery. Do not drink alcohol. Do not take sleeping pills or medicines that cause drowsiness. Do not make important decisions or sign legal documents. Do not take care of children on your own. General instructions Drink enough fluid to keep your urine pale yellow. If you have sleep apnea, surgery and certain medicines  can increase your risk for breathing problems. Follow instructions from your health care provider about wearing your sleep device: Anytime you are sleeping, including during daytime naps. While taking prescription pain medicines, sleeping medicines, or medicines that make you drowsy. Return to your normal activities as told by your health care provider. Ask your health care provider what activities are safe for you. Take over-the-counter and prescription medicines only as told by your health care provider. Do not use any products that contain nicotine or tobacco. These products include cigarettes, chewing tobacco, and vaping devices, such as e-cigarettes. These can delay incision healing after surgery. If you need help quitting, ask your health care provider. Contact a health care provider if: You have nausea or vomiting that does not get better with medicine. You vomit every time you eat or drink. You have pain that does not get better with medicine. You cannot urinate or have bloody urine. You develop a skin rash. You have a fever. Get help right away if: You have trouble breathing. You have chest pain. You vomit blood. These symptoms may be an emergency. Get help right away. Call 911. Do not wait to see if the symptoms will go away. Do not drive yourself to the hospital. Summary After the procedure, it is common to have a sore throat, hoarseness, nausea, vomiting, or to feel weak, sleepy, or fatigue. For the time period you were told by your health care provider, do not drive or use machinery. Get help right away if you have difficulty breathing, have chest pain, or vomit blood. These symptoms may be an emergency. This information is not intended to replace advice given to you by your health care provider. Make sure you discuss any questions you have with your health care provider. Document Revised: 03/27/2021 Document Reviewed: 03/27/2021 Elsevier Patient Education  2023 Else[employer]  Inc. How to Use Chlorhexidine Before Surgery Chlorhexidine gluconate (CHG) is a germ-killing (antiseptic) solution that is used to clean the skin. It can get rid of the bacteria that normally live on the skin and can keep them away for about 24 hours. To clean your skin with CHG, you may be given: A CHG solution to use in the shower or as part of a sponge bath. A prepackaged cloth that contains CHG. Cleaning your skin with CHG may help lower the risk for infection: While you are staying in the intensive care unit of the hospital. If you have a vascular access, such as a central line, to provide short-term or long-term access to your veins. If you have a catheter to drain urine from your bladder. If you  are on a ventilator. A ventilator is a machine that helps you breathe by moving air in and out of your lungs. After surgery. What are the risks? Risks of using CHG include: A skin reaction. Hearing loss, if CHG gets in your ears and you have a perforated eardrum. Eye injury, if CHG gets in your eyes and is not rinsed out. The CHG product catching fire. Make sure that you avoid smoking and flames after applying CHG to your skin. Do not use CHG: If you have a chlorhexidine allergy or have previously reacted to chlorhexidine. On babies younger than 18 months of age. How to use CHG solution Use CHG only as told by your health care provider, and follow the instructions on the label. Use the full amount of CHG as directed. Usually, this is one bottle. During a shower Follow these steps when using CHG solution during a shower (unless your health care provider gives you different instructions): Start the shower. Use your normal soap and shampoo to wash your face and hair. Turn off the shower or move out of the shower stream. Pour the CHG onto a clean washcloth. Do not use any type of brush or rough-edged sponge. Starting at your neck, lather your body down to your toes. Make sure you follow these  instructions: If you will be having surgery, pay special attention to the part of your body where you will be having surgery. Scrub this area for at least 1 minute. Do not use CHG on your head or face. If the solution gets into your ears or eyes, rinse them well with water. Avoid your genital area. Avoid any areas of skin that have broken skin, cuts, or scrapes. Scrub your back and under your arms. Make sure to wash skin folds. Let the lather sit on your skin for 1-2 minutes or as long as told by your health care provider. Thoroughly rinse your entire body in the shower. Make sure that all body creases and crevices are rinsed well. Dry off with a clean towel. Do not put any substances on your body afterward--such as powder, lotion, or perfume--unless you are told to do so by your health care provider. Only use lotions that are recommended by the manufacturer. Put on clean clothes or pajamas. If it is the night before your surgery, sleep in clean sheets.  During a sponge bath Follow these steps when using CHG solution during a sponge bath (unless your health care provider gives you different instructions): Use your normal soap and shampoo to wash your face and hair. Pour the CHG onto a clean washcloth. Starting at your neck, lather your body down to your toes. Make sure you follow these instructions: If you will be having surgery, pay special attention to the part of your body where you will be having surgery. Scrub this area for at least 1 minute. Do not use CHG on your head or face. If the solution gets into your ears or eyes, rinse them well with water. Avoid your genital area. Avoid any areas of skin that have broken skin, cuts, or scrapes. Scrub your back and under your arms. Make sure to wash skin folds. Let the lather sit on your skin for 1-2 minutes or as long as told by your health care provider. Using a different clean, wet washcloth, thoroughly rinse your entire body. Make sure that  all body creases and crevices are rinsed well. Dry off with a clean towel. Do not put any substances on your  body afterward--such as powder, lotion, or perfume--unless you are told to do so by your health care provider. Only use lotions that are recommended by the manufacturer. Put on clean clothes or pajamas. If it is the night before your surgery, sleep in clean sheets. How to use CHG prepackaged cloths Only use CHG cloths as told by your health care provider, and follow the instructions on the label. Use the CHG cloth on clean, dry skin. Do not use the CHG cloth on your head or face unless your health care provider tells you to. When washing with the CHG cloth: Avoid your genital area. Avoid any areas of skin that have broken skin, cuts, or scrapes. Before surgery Follow these steps when using a CHG cloth to clean before surgery (unless your health care provider gives you different instructions): Using the CHG cloth, vigorously scrub the part of your body where you will be having surgery. Scrub using a back-and-forth motion for 3 minutes. The area on your body should be completely wet with CHG when you are done scrubbing. Do not rinse. Discard the cloth and let the area air-dry. Do not put any substances on the area afterward, such as powder, lotion, or perfume. Put on clean clothes or pajamas. If it is the night before your surgery, sleep in clean sheets.  For general bathing Follow these steps when using CHG cloths for general bathing (unless your health care provider gives you different instructions). Use a separate CHG cloth for each area of your body. Make sure you wash between any folds of skin and between your fingers and toes. Wash your body in the following order, switching to a new cloth after each step: The front of your neck, shoulders, and chest. Both of your arms, under your arms, and your hands. Your stomach and groin area, avoiding the genitals. Your right leg and  foot. Your left leg and foot. The back of your neck, your back, and your buttocks. Do not rinse. Discard the cloth and let the area air-dry. Do not put any substances on your body afterward--such as powder, lotion, or perfume--unless you are told to do so by your health care provider. Only use lotions that are recommended by the manufacturer. Put on clean clothes or pajamas. Contact a health care provider if: Your skin gets irritated after scrubbing. You have questions about using your solution or cloth. You swallow any chlorhexidine. Call your local poison control center ((719)347-0760 in the [place] Get help right away if: Your eyes itch badly, or they become very red or swollen. Your skin itches badly and is red or swollen. Your hearing changes. You have trouble seeing. You have swelling or tingling in your mouth or throat. You have trouble breathing. These symptoms may represent a serious problem that is an emergency. Do not wait to see if the symptoms will go away. Get medical help right away. Call your local emergency services (911 in the [place] Do not drive yourself to the hospital. Summary Chlorhexidine gluconate (CHG) is a germ-killing (antiseptic) solution that is used to clean the skin. Cleaning your skin with CHG may help to lower your risk for infection. You may be given CHG to use for bathing. It may be in a bottle or in a prepackaged cloth to use on your skin. Carefully follow your health care provider's instructions and the instructions on the product label. Do not use CHG if you have a chlorhexidine allergy. Contact your health care provider if your skin gets irritated  after scrubbing. This information is not intended to replace advice given to you by your health care provider. Make sure you discuss any questions you have with your health care provider. Document Revised: 04/27/2021 Document Reviewed: 03/10/2020 Elsevier Patient Education  [place].

## 2022-05-20 ENCOUNTER — Encounter (HOSPITAL_COMMUNITY)
Admission: RE | Admit: 2022-05-20 | Discharge: 2022-05-20 | Disposition: A | Discharge status: Home / Self Care | Payer: Medicaid Other | Source: Ambulatory Visit | Attending: General Surgery | Admitting: General Surgery

## 2022-05-20 DIAGNOSIS — E119 Type 2 diabetes mellitus without complications: Secondary | ICD-10-CM

## 2022-05-20 DIAGNOSIS — F1120 Opioid dependence, uncomplicated: Secondary | ICD-10-CM

## 2022-05-20 DIAGNOSIS — B182 Chronic viral hepatitis C: Secondary | ICD-10-CM

## 2022-05-21 ENCOUNTER — Encounter (HOSPITAL_COMMUNITY): Payer: Self-pay

## 2022-05-21 ENCOUNTER — Encounter (HOSPITAL_COMMUNITY)
Admission: RE | Admit: 2022-05-21 | Discharge: 2022-05-21 | Disposition: A | Discharge status: Home / Self Care | Payer: Medicaid Other | Source: Ambulatory Visit | Attending: General Surgery | Admitting: General Surgery

## 2022-05-21 DIAGNOSIS — F1120 Opioid dependence, uncomplicated: Secondary | ICD-10-CM | POA: Diagnosis not present

## 2022-05-21 DIAGNOSIS — B182 Chronic viral hepatitis C: Secondary | ICD-10-CM | POA: Diagnosis not present

## 2022-05-21 DIAGNOSIS — Z01812 Encounter for preprocedural laboratory examination: Secondary | ICD-10-CM | POA: Diagnosis present

## 2022-05-21 DIAGNOSIS — E119 Type 2 diabetes mellitus without complications: Secondary | ICD-10-CM | POA: Diagnosis not present

## 2022-05-21 HISTORY — DX: Personal history of urinary calculi: Z87.442

## 2022-05-21 HISTORY — DX: Type 2 diabetes mellitus without complications: E11.9

## 2022-05-21 LAB — COMPREHENSIVE METABOLIC PANEL
ALT: 36 U/L (ref 0–44)
AST: 20 U/L (ref 15–41)
Albumin: 3.6 g/dL (ref 3.5–5.0)
Alkaline Phosphatase: 41 U/L (ref 38–126)
Anion gap: 9 (ref 5–15)
BUN: 22 mg/dL — ABNORMAL HIGH (ref 6–20)
CO2: 22 mmol/L (ref 22–32)
Calcium: 9.1 mg/dL (ref 8.9–10.3)
Chloride: 102 mmol/L (ref 98–111)
Creatinine, Ser: 0.93 mg/dL (ref 0.61–1.24)
GFR, Estimated: >60 mL/min (ref >60)
Glucose, Bld: 242 mg/dL — ABNORMAL HIGH (ref 70–99)
Potassium: 3.9 mmol/L (ref 3.5–5.1)
Sodium: 133 mmol/L — ABNORMAL LOW (ref 135–145)
Total Bilirubin: 0.8 mg/dL (ref 0.3–1.2)
Total Protein: 7.3 g/dL (ref 6.5–8.1)

## 2022-05-21 LAB — PROTIME-INR
INR: 1 (ref 0.8–1.2)
Prothrombin Time: 13.7 seconds (ref 11.4–15.2)

## 2022-05-21 LAB — HEMOGLOBIN A1C
Hgb A1c MFr Bld: 6.7 % — ABNORMAL HIGH (ref 4.8–5.6)
Mean Plasma Glucose: 145.59 mg/dL

## 2022-05-21 LAB — CBC WITH DIFFERENTIAL/PLATELET
Abs Immature Granulocytes: 0.01 10*3/uL (ref 0.00–0.07)
Basophils Absolute: 0 10*3/uL (ref 0.0–0.1)
Basophils Relative: 0 %
Eosinophils Absolute: 0.2 10*3/uL (ref 0.0–0.5)
Eosinophils Relative: 5 %
HCT: 40.5 % (ref 39.0–52.0)
Hemoglobin: 13.4 g/dL (ref 13.0–17.0)
Immature Granulocytes: 0 %
Lymphocytes Relative: 45 %
Lymphs Abs: 1.6 10*3/uL (ref 0.7–4.0)
MCH: 30.2 pg (ref 26.0–34.0)
MCHC: 33.1 g/dL (ref 30.0–36.0)
MCV: 91.4 fL (ref 80.0–100.0)
Monocytes Absolute: 0.2 10*3/uL (ref 0.1–1.0)
Monocytes Relative: 6 %
Neutro Abs: 1.6 10*3/uL — ABNORMAL LOW (ref 1.7–7.7)
Neutrophils Relative %: 44 %
Platelets: 130 10*3/uL — ABNORMAL LOW (ref 150–400)
RBC: 4.43 MIL/uL (ref 4.22–5.81)
RDW: 14.6 % (ref 11.5–15.5)
WBC: 3.7 10*3/uL — ABNORMAL LOW (ref 4.0–10.5)
nRBC: 0 % (ref 0.0–0.2)

## 2022-05-21 LAB — RAPID URINE DRUG SCREEN, HOSP PERFORMED
Amphetamines: NOT DETECTED
Barbiturates: NOT DETECTED
Benzodiazepines: NOT DETECTED
Cocaine: NOT DETECTED
Opiates: NOT DETECTED
Tetrahydrocannabinol: NOT DETECTED

## 2022-05-24 ENCOUNTER — Ambulatory Visit (HOSPITAL_COMMUNITY): Payer: Medicaid Other | Admitting: Anesthesiology

## 2022-05-24 ENCOUNTER — Other Ambulatory Visit: Payer: Self-pay

## 2022-05-24 ENCOUNTER — Encounter (HOSPITAL_COMMUNITY)
Admission: RE | Disposition: A | Discharge status: Home / Self Care | Payer: Self-pay | Source: Home / Self Care | Attending: General Surgery

## 2022-05-24 ENCOUNTER — Ambulatory Visit (HOSPITAL_COMMUNITY)
Admission: RE | Admit: 2022-05-24 | Discharge: 2022-05-24 | Disposition: A | Discharge status: Home / Self Care | Payer: Medicaid Other | Attending: General Surgery | Admitting: General Surgery

## 2022-05-24 ENCOUNTER — Ambulatory Visit (HOSPITAL_BASED_OUTPATIENT_CLINIC_OR_DEPARTMENT_OTHER): Payer: Medicaid Other | Admitting: Anesthesiology

## 2022-05-24 ENCOUNTER — Encounter (HOSPITAL_COMMUNITY): Payer: Self-pay | Admitting: General Surgery

## 2022-05-24 DIAGNOSIS — Z79899 Other long term (current) drug therapy: Secondary | ICD-10-CM | POA: Diagnosis not present

## 2022-05-24 DIAGNOSIS — F909 Attention-deficit hyperactivity disorder, unspecified type: Secondary | ICD-10-CM | POA: Insufficient documentation

## 2022-05-24 DIAGNOSIS — E1149 Type 2 diabetes mellitus with other diabetic neurological complication: Secondary | ICD-10-CM

## 2022-05-24 DIAGNOSIS — I10 Essential (primary) hypertension: Secondary | ICD-10-CM | POA: Diagnosis not present

## 2022-05-24 DIAGNOSIS — K219 Gastro-esophageal reflux disease without esophagitis: Secondary | ICD-10-CM | POA: Insufficient documentation

## 2022-05-24 DIAGNOSIS — J449 Chronic obstructive pulmonary disease, unspecified: Secondary | ICD-10-CM | POA: Diagnosis not present

## 2022-05-24 DIAGNOSIS — K4090 Unilateral inguinal hernia, without obstruction or gangrene, not specified as recurrent: Secondary | ICD-10-CM | POA: Diagnosis not present

## 2022-05-24 DIAGNOSIS — K4020 Bilateral inguinal hernia, without obstruction or gangrene, not specified as recurrent: Secondary | ICD-10-CM | POA: Diagnosis present

## 2022-05-24 DIAGNOSIS — Z7984 Long term (current) use of oral hypoglycemic drugs: Secondary | ICD-10-CM | POA: Diagnosis not present

## 2022-05-24 DIAGNOSIS — F419 Anxiety disorder, unspecified: Secondary | ICD-10-CM | POA: Diagnosis not present

## 2022-05-24 DIAGNOSIS — F1120 Opioid dependence, uncomplicated: Secondary | ICD-10-CM | POA: Insufficient documentation

## 2022-05-24 DIAGNOSIS — E119 Type 2 diabetes mellitus without complications: Secondary | ICD-10-CM | POA: Diagnosis not present

## 2022-05-24 DIAGNOSIS — F319 Bipolar disorder, unspecified: Secondary | ICD-10-CM | POA: Diagnosis not present

## 2022-05-24 DIAGNOSIS — K660 Peritoneal adhesions (postprocedural) (postinfection): Secondary | ICD-10-CM

## 2022-05-24 DIAGNOSIS — F17210 Nicotine dependence, cigarettes, uncomplicated: Secondary | ICD-10-CM

## 2022-05-24 HISTORY — PX: XI ROBOTIC ASSISTED INGUINAL HERNIA REPAIR WITH MESH: SHX6706

## 2022-05-24 HISTORY — PX: LAPAROSCOPIC LYSIS OF ADHESIONS: SHX5905

## 2022-05-24 LAB — GLUCOSE, CAPILLARY
Glucose-Capillary: 151 mg/dL — ABNORMAL HIGH (ref 70–99)
Glucose-Capillary: 175 mg/dL — ABNORMAL HIGH (ref 70–99)

## 2022-05-24 SURGERY — REPAIR, HERNIA, INGUINAL, ROBOT-ASSISTED, LAPAROSCOPIC, USING MESH
Anesthesia: General | Site: Inguinal | Laterality: Right

## 2022-05-24 MED ORDER — LIDOCAINE HCL (CARDIAC) PF 100 MG/5ML IV SOSY
PREFILLED_SYRINGE | INTRAVENOUS | Status: DC | PRN
  Administered 2022-05-24: 60 mg via INTRATRACHEAL

## 2022-05-24 MED ORDER — LACTATED RINGERS IV SOLN
INTRAVENOUS | Status: DC
  Administered 2022-05-24: 1000 mL via INTRAVENOUS

## 2022-05-24 MED ORDER — FENTANYL CITRATE (PF) 100 MCG/2ML IJ SOLN
INTRAMUSCULAR | Status: DC | PRN
  Administered 2022-05-24: 100 ug via INTRAVENOUS

## 2022-05-24 MED ORDER — SUGAMMADEX SODIUM 200 MG/2ML IV SOLN
INTRAVENOUS | Status: DC | PRN
  Administered 2022-05-24: 200 mg via INTRAVENOUS

## 2022-05-24 MED ORDER — KETAMINE HCL 10 MG/ML IJ SOLN
INTRAMUSCULAR | Status: DC | PRN
  Administered 2022-05-24 (×2): 10 mg via INTRAVENOUS
  Administered 2022-05-24: 30 mg via INTRAVENOUS

## 2022-05-24 MED ORDER — CEFAZOLIN SODIUM-DEXTROSE 2-4 GM/100ML-% IV SOLN
2.0000 g | INTRAVENOUS | Status: AC
  Administered 2022-05-24: 2 g via INTRAVENOUS
  Filled 2022-05-24: qty 100

## 2022-05-24 MED ORDER — PROPOFOL 10 MG/ML IV BOLUS
INTRAVENOUS | Status: DC | PRN
  Administered 2022-05-24: 40 mg via INTRAVENOUS
  Administered 2022-05-24: 250 mg via INTRAVENOUS

## 2022-05-24 MED ORDER — EPHEDRINE SULFATE-NACL 50-0.9 MG/10ML-% IV SOSY
PREFILLED_SYRINGE | INTRAVENOUS | Status: DC | PRN
  Administered 2022-05-24: 10 mg via INTRAVENOUS

## 2022-05-24 MED ORDER — PHENYLEPHRINE 80 MCG/ML (10ML) SYRINGE FOR IV PUSH (FOR BLOOD PRESSURE SUPPORT)
PREFILLED_SYRINGE | INTRAVENOUS | Status: DC | PRN
  Administered 2022-05-24: 320 ug via INTRAVENOUS
  Administered 2022-05-24: 80 ug via INTRAVENOUS
  Administered 2022-05-24: 160 ug via INTRAVENOUS

## 2022-05-24 MED ORDER — ONDANSETRON HCL 4 MG/2ML IJ SOLN
INTRAMUSCULAR | Status: DC | PRN
  Administered 2022-05-24: 4 mg via INTRAVENOUS

## 2022-05-24 MED ORDER — KETOROLAC TROMETHAMINE 30 MG/ML IJ SOLN
INTRAMUSCULAR | Status: DC | PRN
  Administered 2022-05-24: 30 mg via INTRAVENOUS

## 2022-05-24 MED ORDER — DEXAMETHASONE SODIUM PHOSPHATE 10 MG/ML IJ SOLN
INTRAMUSCULAR | Status: DC | PRN
  Administered 2022-05-24: 6 mg via INTRAVENOUS

## 2022-05-24 MED ORDER — ONDANSETRON HCL 4 MG/2ML IJ SOLN
INTRAMUSCULAR | Status: AC
  Filled 2022-05-24: qty 2

## 2022-05-24 MED ORDER — ONDANSETRON HCL 4 MG/2ML IJ SOLN: 4.0000 mg | Freq: Once | INTRAMUSCULAR | Status: DC | PRN

## 2022-05-24 MED ORDER — ROCURONIUM BROMIDE 10 MG/ML (PF) SYRINGE
PREFILLED_SYRINGE | INTRAVENOUS | Status: AC
  Filled 2022-05-24: qty 10

## 2022-05-24 MED ORDER — ORAL CARE MOUTH RINSE: 15.0000 mL | Freq: Once | OROMUCOSAL | Status: AC

## 2022-05-24 MED ORDER — HYDROMORPHONE HCL 1 MG/ML IJ SOLN
INTRAMUSCULAR | Status: DC | PRN
  Administered 2022-05-24 (×2): .5 mg via INTRAVENOUS

## 2022-05-24 MED ORDER — CHLORHEXIDINE GLUCONATE 0.12 % MT SOLN
15.0000 mL | Freq: Once | OROMUCOSAL | Status: AC
  Administered 2022-05-24: 15 mL via OROMUCOSAL

## 2022-05-24 MED ORDER — CHLORHEXIDINE GLUCONATE CLOTH 2 % EX PADS: 6.0000 | MEDICATED_PAD | Freq: Once | CUTANEOUS | Status: DC

## 2022-05-24 MED ORDER — PROPOFOL 10 MG/ML IV BOLUS
INTRAVENOUS | Status: AC
  Filled 2022-05-24: qty 20

## 2022-05-24 MED ORDER — STERILE WATER FOR IRRIGATION IR SOLN
Status: DC | PRN
  Administered 2022-05-24: 500 mL

## 2022-05-24 MED ORDER — HYDROMORPHONE HCL 1 MG/ML IJ SOLN
0.2500 mg | INTRAMUSCULAR | Status: AC | PRN
  Administered 2022-05-24 (×4): 0.5 mg via INTRAVENOUS
  Filled 2022-05-24 (×4): qty 0.5

## 2022-05-24 MED ORDER — PHENYLEPHRINE 80 MCG/ML (10ML) SYRINGE FOR IV PUSH (FOR BLOOD PRESSURE SUPPORT)
PREFILLED_SYRINGE | INTRAVENOUS | Status: AC
  Filled 2022-05-24: qty 10

## 2022-05-24 MED ORDER — BUPIVACAINE LIPOSOME 1.3 % IJ SUSP
INTRAMUSCULAR | Status: AC
  Filled 2022-05-24: qty 20

## 2022-05-24 MED ORDER — ROCURONIUM BROMIDE 10 MG/ML (PF) SYRINGE
PREFILLED_SYRINGE | INTRAVENOUS | Status: DC | PRN
  Administered 2022-05-24: 30 mg via INTRAVENOUS
  Administered 2022-05-24: 50 mg via INTRAVENOUS
  Administered 2022-05-24: 10 mg via INTRAVENOUS

## 2022-05-24 MED ORDER — DEXAMETHASONE SODIUM PHOSPHATE 10 MG/ML IJ SOLN
INTRAMUSCULAR | Status: AC
  Filled 2022-05-24: qty 1

## 2022-05-24 MED ORDER — HYDROMORPHONE HCL 1 MG/ML IJ SOLN
INTRAMUSCULAR | Status: AC
  Filled 2022-05-24: qty 1

## 2022-05-24 MED ORDER — OXYCODONE HCL 5 MG PO TABS: 5.0000 mg | ORAL_TABLET | Freq: Three times a day (TID) | ORAL | 0 refills | Status: DC | PRN

## 2022-05-24 MED ORDER — MIDAZOLAM HCL 2 MG/2ML IJ SOLN
INTRAMUSCULAR | Status: DC | PRN
  Administered 2022-05-24: 2 mg via INTRAVENOUS

## 2022-05-24 MED ORDER — KETAMINE HCL 50 MG/5ML IJ SOSY
PREFILLED_SYRINGE | INTRAMUSCULAR | Status: AC
  Filled 2022-05-24: qty 5

## 2022-05-24 MED ORDER — LIDOCAINE HCL (PF) 2 % IJ SOLN
INTRAMUSCULAR | Status: AC
  Filled 2022-05-24: qty 5

## 2022-05-24 MED ORDER — MEPERIDINE HCL 50 MG/ML IJ SOLN: 6.2500 mg | INTRAMUSCULAR | Status: DC | PRN

## 2022-05-24 MED ORDER — MIDAZOLAM HCL 2 MG/2ML IJ SOLN
INTRAMUSCULAR | Status: AC
  Filled 2022-05-24: qty 2

## 2022-05-24 MED ORDER — FENTANYL CITRATE (PF) 100 MCG/2ML IJ SOLN
INTRAMUSCULAR | Status: AC
  Filled 2022-05-24: qty 2

## 2022-05-24 MED ORDER — KETOROLAC TROMETHAMINE 30 MG/ML IJ SOLN
INTRAMUSCULAR | Status: AC
  Filled 2022-05-24: qty 1

## 2022-05-24 MED ORDER — EPHEDRINE 5 MG/ML INJ
INTRAVENOUS | Status: AC
  Filled 2022-05-24: qty 5

## 2022-05-24 MED ORDER — BUPIVACAINE LIPOSOME 1.3 % IJ SUSP
INTRAMUSCULAR | Status: DC | PRN
  Administered 2022-05-24: 20 mL

## 2022-05-24 SURGICAL SUPPLY — 55 items
ADH SKN CLS APL DERMABOND .7 (GAUZE/BANDAGES/DRESSINGS) ×2
APL PRP STRL LF DISP 70% ISPRP (MISCELLANEOUS) ×2
CATH FOLEY 2WAY SLVR  5CC 12FR (CATHETERS) ×2
CATH FOLEY 2WAY SLVR 5CC 12FR (CATHETERS) IMPLANT
CHLORAPREP W/TINT 26 (MISCELLANEOUS) ×2 IMPLANT
COVER LIGHT HANDLE STERIS (MISCELLANEOUS) ×2 IMPLANT
COVER MAYO STAND STRL (DRAPES) ×2 IMPLANT
COVER MAYO STAND XLG (MISCELLANEOUS) ×2 IMPLANT
COVER TIP SHEARS 8 DVNC (MISCELLANEOUS) ×2 IMPLANT
DEFOGGER SCOPE WARMER CLEARIFY (MISCELLANEOUS) IMPLANT
DERMABOND ADVANCED .7 DNX12 (GAUZE/BANDAGES/DRESSINGS) ×2 IMPLANT
DRAPE ARM DVNC X/XI (DISPOSABLE) ×6 IMPLANT
DRAPE COLUMN DVNC XI (DISPOSABLE) ×2 IMPLANT
DRAPE HALF SHEET 40X57 (DRAPES) ×2 IMPLANT
DRIVER NDL MEGA SUTCUT DVNCXI (INSTRUMENTS) ×2 IMPLANT
DRIVER NDLE MEGA SUTCUT DVNCXI (INSTRUMENTS) ×2 IMPLANT
ELECT REM PT RETURN 9FT ADLT (ELECTROSURGICAL) ×2
ELECTRODE REM PT RTRN 9FT ADLT (ELECTROSURGICAL) ×2 IMPLANT
FORCEPS BPLR R/ABLATION 8 DVNC (INSTRUMENTS) ×2 IMPLANT
GAUZE SPONGE 4X4 12PLY STRL (GAUZE/BANDAGES/DRESSINGS) ×2 IMPLANT
GLOVE BIOGEL PI IND STRL 7.0 (GLOVE) ×8 IMPLANT
GLOVE SURG SS PI 7.5 STRL IVOR (GLOVE) ×4 IMPLANT
GOWN STRL REUS W/TWL LRG LVL3 (GOWN DISPOSABLE) ×4 IMPLANT
GRASPER SUT TROCAR 14GX15 (MISCELLANEOUS) IMPLANT
IRRIGATOR SUCT 8 DISP DVNC XI (IRRIGATION / IRRIGATOR) IMPLANT
IV NS IRRIG 3000ML ARTHROMATIC (IV SOLUTION) IMPLANT
KIT PINK PAD W/HEAD ARE REST (MISCELLANEOUS) ×2
KIT PINK PAD W/HEAD ARM REST (MISCELLANEOUS) ×2 IMPLANT
KIT TURNOVER KIT A (KITS) ×2 IMPLANT
MANIFOLD NEPTUNE II (INSTRUMENTS) ×2 IMPLANT
MESH 3DMAX MID 4X6 RT LRG (Mesh General) IMPLANT
MESH 3DMAX MID 5X7 RT XLRG (Mesh General) IMPLANT
NDL HYPO 21X1.5 SAFETY (NEEDLE) ×2 IMPLANT
NDL INSUFFLATION 14GA 120MM (NEEDLE) ×2 IMPLANT
NEEDLE HYPO 21X1.5 SAFETY (NEEDLE) ×2 IMPLANT
NEEDLE INSUFFLATION 14GA 120MM (NEEDLE) ×2 IMPLANT
OBTURATOR OPTICAL STND 8 DVNC (TROCAR) ×2
OBTURATOR OPTICALSTD 8 DVNC (TROCAR) ×2 IMPLANT
PACK LAP CHOLE LZT030E (CUSTOM PROCEDURE TRAY) ×2 IMPLANT
PENCIL HANDSWITCHING (ELECTRODE) ×2 IMPLANT
SCISSORS MNPLR CVD DVNC XI (INSTRUMENTS) ×2 IMPLANT
SEAL CANN UNIV 5-8 DVNC XI (MISCELLANEOUS) ×6 IMPLANT
SET BASIN LINEN APH (SET/KITS/TRAYS/PACK) ×2 IMPLANT
SET TUBE SMOKE EVAC HIGH FLOW (TUBING) ×2 IMPLANT
SOL PREP POV-IOD 4OZ 10% (MISCELLANEOUS) ×2 IMPLANT
SUT MNCRL AB 4-0 PS2 18 (SUTURE) ×4 IMPLANT
SUT V-LOC 90 ABS 3-0 VLT  V-20 (SUTURE) ×6
SUT V-LOC 90 ABS 3-0 VLT V-20 (SUTURE) ×4 IMPLANT
SUT VIC AB 2-0 SH 27 (SUTURE) ×2
SUT VIC AB 2-0 SH 27X BRD (SUTURE) ×2 IMPLANT
SYR 30ML LL (SYRINGE) ×2 IMPLANT
TAPE TRANSPORE STRL 2 31045 (GAUZE/BANDAGES/DRESSINGS) ×2 IMPLANT
TRAY FOL W/BAG SLVR 16FR STRL (SET/KITS/TRAYS/PACK) ×2 IMPLANT
TRAY FOLEY W/BAG SLVR 16FR LF (SET/KITS/TRAYS/PACK) ×2
WATER STERILE IRR 500ML POUR (IV SOLUTION) ×2 IMPLANT

## 2022-05-24 NOTE — Anesthesia Preprocedure Evaluation (Signed)
Anesthesia Evaluation  Patient identified by MRN, date of birth, ID band Patient awake    Reviewed: Allergy & Precautions, H&P , NPO status , Patient's Chart, lab work & pertinent test results  Airway Mallampati: II  TM Distance: >3 FB Neck ROM: Full    Dental  (+) Dental Advisory Given, Poor Dentition, Chipped, Loose, Missing,    Pulmonary COPD, Current SmokerPatient did not abstain from smoking.   Pulmonary exam normal breath sounds clear to auscultation       Cardiovascular hypertension, Pt. on medications Normal cardiovascular exam Rhythm:Regular Rate:Normal  19-Feb-2022 23:06:07 [facility] ROUTINE RECORD 1975-02-23 (46 yr) Male Caucasian Room:WLEX18 Loc:[id number] Technician: Test ind: Vent. 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, Hepatitis -, C  Endo/Other  diabetes, Well Controlled, Type 2, Oral Hypoglycemic Agents    Renal/GU negative Renal ROS  negative genitourinary   Musculoskeletal  (+) Arthritis , Osteoarthritis,  narcotic dependent  Abdominal   Peds  (+) ADHD Hematology negative hematology ROS (+)   Anesthesia Other Findings   Reproductive/Obstetrics negative OB ROS                             Anesthesia Physical Anesthesia Plan  ASA: 3  Anesthesia Plan: General   Post-op Pain Management: Dilaudid IV and Ketamine IV*   Induction: Intravenous  PONV Risk Score and Plan: 3 and Ondansetron and Dexamethasone  Airway Management Planned: Oral ETT  Additional Equipment:    Intra-op Plan:   Post-operative Plan: Extubation in OR  Informed Consent: I have reviewed the patients History and Physical, chart, labs and discussed the procedure including the risks, benefits and alternatives for the proposed anesthesia with the patient or authorized representative who has indicated his/her understanding and acceptance.     Dental advisory given  Plan Discussed with: CRNA and Surgeon  Anesthesia Plan Comments:         Anesthesia Quick Evaluation

## 2022-05-24 NOTE — Anesthesia Procedure Notes (Signed)
Procedure Name: Intubation Date/Time: 05/24/2022 10:18 AM  Performed by: Oletha Cruel, CRNAPre-anesthesia Checklist: Emergency Drugs available, Suction available, Patient identified, Patient being monitored and Timeout performed Patient Re-evaluated:Patient Re-evaluated prior to induction Oxygen Delivery Method: Circle system utilized Preoxygenation: Pre-oxygenation with 100% oxygen Induction Type: IV induction Ventilation: Mask ventilation without difficulty and Oral airway inserted - appropriate to patient size Laryngoscope Size: Mac and 4 Grade View: Grade II Tube type: Oral Tube size: 7.5 mm Number of attempts: 1 Airway Equipment and Method: Stylet Secured at: 21 cm Tube secured with: Tape Dental Injury: Teeth and Oropharynx as per pre-operative assessment

## 2022-05-24 NOTE — Anesthesia Postprocedure Evaluation (Signed)
Anesthesia Post Note  Patient: Joshua Thomas  Procedure(s) Performed: XI ROBOTIC ASSISTED INGUINAL HERNIA REPAIR WITH MESH (Right: Inguinal) LAPAROSCOPIC LYSIS OF ADHESIONS (Abdomen)  Patient location during evaluation: Phase II Anesthesia Type: General Level of consciousness: awake and alert and oriented Pain management: pain level controlled Vital Signs Assessment: post-procedure vital signs reviewed and stable Respiratory status: spontaneous breathing, nonlabored ventilation and respiratory function stable Cardiovascular status: blood pressure returned to baseline and stable Postop Assessment: no apparent nausea or vomiting Anesthetic complications: no  No notable events documented.   Last Vitals:  Vitals:   05/24/22 1339 05/24/22 1345  BP:  117/70  Pulse: 94 95  Resp: 17 12  Temp:    SpO2: 96% 96%    Last Pain:  Vitals:   05/24/22 1339  TempSrc:   PainSc: 8                  Vivien Barretto C Wonda Goodgame

## 2022-05-24 NOTE — Op Note (Signed)
Patient:  Joshua Thomas  DOB:  27-May-1975  MRN:  [id number]   Preop Diagnosis: Bilateral inguinal hernias  Postop Diagnosis: Right inguinal hernia, laxity of left inguinal floor  Procedure: Robotic assisted laparoscopic right inguinal herniorrhaphy with mesh, lysis of adhesions  Surgeon: Franky Macho, MD  Anes: General endotracheal  Indications: Patient is a 47 year old white male who presents with a very large symptomatic right inguinal hernia as well as a possible left inguinal hernia.  The risks and benefits of the procedures including bleeding, infection, mesh use, and the possibility of recurrence of the hernias were fully explained to the patient, who gave informed consent.  Procedure note: The patient was placed in the supine position.  After induction of general endotracheal anesthesia, the abdomen was prepped and draped using usual sterile technique with ChloraPrep.  Surgical site confirmation was performed.  An incision was made at Palmer's point.  A Veress needle was introduced into the abdominal cavity and confirmation of placement was done using the saline drop test.  The abdomen was then insufflated 15 mmHg pressure.  An 8 mm trocar was introduced into the abdominal cavity under direct visualization without difficulty.  An additional 8 mm trocar was placed in the upper midline region as well as the right flank region.  There were multiple adhesions of omentum at the midline and right lateral trocar sites.  These were taken down laparoscopically using the scissors.  Once I was able to take down these adhesions, the robot was then targeted and docked.  On inspection, the patient had a right inguinal hernia that contained the cecum, appendix, and small bowel.  The bowel appeared to be adherent to the peritoneal layer.  On the left side, there was laxity of the inguinal region, but no discrete inguinal hernia.  It was thus elected to just proceed with a right inguinal herniorrhaphy.  A  peritoneal flap was formed lateral to medial.  The bladder flap was taken down to Cooper's ligament medially and was taken down laterally.  The hernia sac was then freed away from the spermatic cord using Bovie electrocautery.  I was able to get a 6 cm posterior window between the peritoneum and the indirect hernia defect.  A large 3D max medium weight mesh was then inserted and secured to Cooper's ligament using a 2-0 Vicryl suture.  The peritoneal flap was then closed using a 3-0 V-Loc running suture.  The mesh was noted to be in appropriate position.  All sutures were then removed.  The robot was undocked and all air was evacuated from the abdominal cavity prior to removal of the trocars.  All wounds were irrigated with normal saline.  All wounds were injected with Exparel.  The skin incisions were closed using a 4-0 Monocryl subcuticular suture.  Dermabond was applied.  All tape and needle counts were correct at the end of the procedure.  The patient was extubated in the operating room and transferred to PACU in stable condition.  Complications: None  EBL: Minimal  Specimen: None

## 2022-05-24 NOTE — Interval H&P Note (Signed)
History and Physical Interval Note:  05/24/2022 9:18 AM  Joshua Thomas  has presented today for surgery, with the diagnosis of Bilateral inguinal hernias.  The various methods of treatment have been discussed with the patient and family. After consideration of risks, benefits and other options for treatment, the patient has consented to  Procedure(s): XI ROBOTIC ASSISTED INGUINAL HERNIA REPAIR WITH MESH (Bilateral) as a surgical intervention.  The patient's history has been reviewed, patient examined, no change in status, stable for surgery.  I have reviewed the patient's chart and labs.  Questions were answered to the patient's satisfaction.     Franky Macho

## 2022-05-24 NOTE — Transfer of Care (Signed)
Immediate Anesthesia Transfer of Care Note  Patient: Joshua Thomas  Procedure(s) Performed: XI ROBOTIC ASSISTED INGUINAL HERNIA REPAIR WITH MESH (Right: Inguinal) LAPAROSCOPIC LYSIS OF ADHESIONS (Abdomen)  Patient Location: PACU  Anesthesia Type:General  Level of Consciousness: drowsy and patient cooperative  Airway & Oxygen Therapy: Patient Spontanous Breathing and Patient connected to nasal cannula oxygen  Post-op Assessment: Report given to RN and Post -op Vital signs reviewed and stable  Post vital signs: Reviewed and stable  Last Vitals:  Vitals Value Taken Time  BP 126/92 05/24/22 1300  Temp 36.3 C 05/24/22 1255  Pulse 92 05/24/22 1303  Resp 13 05/24/22 1303  SpO2 93 % 05/24/22 1303  Vitals shown include unvalidated device data.  Last Pain:  Vitals:   05/24/22 1255  TempSrc:   PainSc: Asleep      Patients Stated Pain Goal: 6 (05/24/22 0845)  Complications: No notable events documented.

## 2022-05-28 ENCOUNTER — Other Ambulatory Visit: Payer: Self-pay

## 2022-05-28 ENCOUNTER — Encounter (HOSPITAL_COMMUNITY): Payer: Self-pay | Admitting: General Surgery

## 2022-05-28 ENCOUNTER — Emergency Department (HOSPITAL_COMMUNITY)
Admission: EM | Admit: 2022-05-28 | Discharge: 2022-05-29 | Disposition: A | Discharge status: Home / Self Care | Payer: Medicaid Other | Attending: Emergency Medicine | Admitting: Emergency Medicine

## 2022-05-28 DIAGNOSIS — G8929 Other chronic pain: Secondary | ICD-10-CM | POA: Insufficient documentation

## 2022-05-28 DIAGNOSIS — S3022XA Contusion of scrotum and testes, initial encounter: Secondary | ICD-10-CM | POA: Insufficient documentation

## 2022-05-28 DIAGNOSIS — S3021XA Contusion of penis, initial encounter: Secondary | ICD-10-CM | POA: Insufficient documentation

## 2022-05-28 DIAGNOSIS — K5900 Constipation, unspecified: Secondary | ICD-10-CM | POA: Insufficient documentation

## 2022-05-28 DIAGNOSIS — X58XXXA Exposure to other specified factors, initial encounter: Secondary | ICD-10-CM | POA: Diagnosis not present

## 2022-05-28 DIAGNOSIS — R1031 Right lower quadrant pain: Secondary | ICD-10-CM | POA: Diagnosis not present

## 2022-05-28 DIAGNOSIS — S3994XA Unspecified injury of external genitals, initial encounter: Secondary | ICD-10-CM | POA: Diagnosis present

## 2022-05-28 DIAGNOSIS — G8918 Other acute postprocedural pain: Secondary | ICD-10-CM

## 2022-05-28 LAB — CBC WITH DIFFERENTIAL/PLATELET
Abs Immature Granulocytes: 0.02 10*3/uL (ref 0.00–0.07)
Basophils Absolute: 0 10*3/uL (ref 0.0–0.1)
Basophils Relative: 0 %
Eosinophils Absolute: 0.2 10*3/uL (ref 0.0–0.5)
Eosinophils Relative: 3 %
HCT: 43.4 % (ref 39.0–52.0)
Hemoglobin: 14.1 g/dL (ref 13.0–17.0)
Immature Granulocytes: 0 %
Lymphocytes Relative: 41 %
Lymphs Abs: 2.2 10*3/uL (ref 0.7–4.0)
MCH: 29.8 pg (ref 26.0–34.0)
MCHC: 32.5 g/dL (ref 30.0–36.0)
MCV: 91.8 fL (ref 80.0–100.0)
Monocytes Absolute: 0.4 10*3/uL (ref 0.1–1.0)
Monocytes Relative: 8 %
Neutro Abs: 2.7 10*3/uL (ref 1.7–7.7)
Neutrophils Relative %: 48 %
Platelets: 193 10*3/uL (ref 150–400)
RBC: 4.73 MIL/uL (ref 4.22–5.81)
RDW: 14 % (ref 11.5–15.5)
WBC: 5.5 10*3/uL (ref 4.0–10.5)
nRBC: 0 % (ref 0.0–0.2)

## 2022-05-28 MED ORDER — ONDANSETRON HCL 4 MG/2ML IJ SOLN
4.0000 mg | Freq: Once | INTRAMUSCULAR | Status: AC
  Administered 2022-05-28: 4 mg via INTRAVENOUS
  Filled 2022-05-28: qty 2

## 2022-05-28 MED ORDER — HYDROMORPHONE HCL 1 MG/ML IJ SOLN
1.0000 mg | Freq: Once | INTRAMUSCULAR | Status: AC
  Administered 2022-05-28: 1 mg via INTRAVENOUS
  Filled 2022-05-28: qty 1

## 2022-05-28 MED ORDER — LACTATED RINGERS IV BOLUS
1000.0000 mL | Freq: Once | INTRAVENOUS | Status: AC
  Administered 2022-05-28: 1000 mL via INTRAVENOUS

## 2022-05-28 NOTE — ED Provider Triage Note (Signed)
  Emergency Medicine Provider Triage Evaluation Note  MRN:  [id number]  Arrival date & time: 05/28/22    Medically screening exam initiated at 11:09 PM.   CC:   Post-op Problem   HPI:  Joshua Thomas is a 47 y.o. year-old male presents to the ED with chief complaint of abdominal pain.  4 days s/p inguinal repair.  Presenting with severe pain.  History provided by patient. ROS:  -As included in HPI PE:   Vitals:   05/28/22 2304  Pulse: (!) 113  Resp: 17  Temp: 98.3 F (36.8 C)  SpO2: 100%    Non-toxic appearing No respiratory distress Bruising of scrotum Abdominal tenderness MDM:   I've ordered labs and imaging in triage to expedite lab/diagnostic workup.  Patient was informed that the remainder of the evaluation will be completed by another provider, this initial triage assessment does not replace that evaluation, and the importance of remaining in the ED until their evaluation is complete.    Roxy Horseman, PA-C 05/28/22 2310

## 2022-05-28 NOTE — ED Provider Notes (Signed)
[place] EMERGENCY DEPARTMENT AT [facility] Provider Note   CSN: [id number] Arrival date & time: 05/28/22  2246     History {Add pertinent medical, surgical, social history, OB history to HPI:1} Chief Complaint  Patient presents with   Post-op Problem    URBANO DUNSWORTH is a 47 y.o. male.  The history is provided by the patient and medical records.  SMAYAN BERENSON is a 47 y.o. male who presents to the Emergency Department complaining of abdominal pain.  He presents to the emergency department for evaluation of right lower quadrant abdominal pain following bilateral inguinal hernia repair on May 13.  He states that he did have some right lower quadrant pain following the procedure but this is persistently worsened since hospital discharge.  No associated fevers, nausea, vomiting.  He does report constipation, last bowel movement a few days ago but he is still passing flatus.  He also reports some associated dysuria.  He does have ecchymosis to his testicles.  He does take Suboxone 12 mg twice daily.  He has been prescribed oxycodone for postoperative pain but feels like this is not adequate for pain control.     Home Medications Prior to Admission medications   Medication Sig Start Date End Date Taking? Authorizing Provider  Buprenorphine HCl-Naloxone HCl (SUBOXONE) 8-2 MG FILM Place 2 Film under the tongue daily.   Yes [provider]  Calcium Carbonate Antacid (TUMS PO) Take 2-3 tablets by mouth daily as needed (gerd).   Yes [provider]  cloNIDine (CATAPRES) 0.1 MG tablet Take 0.1 mg by mouth 4 (four) times daily. 09/17/21  Yes [provider]  cyclobenzaprine (FLEXERIL) 5 MG tablet Take 1 tablet (5 mg total) by mouth 3 (three) times daily as needed for muscle spasms. 12/15/21  Yes Benjiman Core, MD  divalproex (DEPAKOTE) 250 MG DR tablet Take 250 mg by mouth at bedtime. 03/09/22  Yes [provider]  ibuprofen (ADVIL) 100 MG tablet  Take 200 mg by mouth every 6 (six) hours as needed for fever.   Yes [provider]  Melatonin 10 MG TABS Take 10 mg by mouth at bedtime. 9pm   Yes [provider]  metFORMIN (GLUCOPHAGE-XR) 750 MG 24 hr tablet Take 750 mg by mouth daily with breakfast. 08/05/21  Yes [provider]  oxyCODONE (ROXICODONE) 5 MG immediate release tablet Take 1 tablet (5 mg total) by mouth every 8 (eight) hours as needed. Patient taking differently: Take 5 mg by mouth every 8 (eight) hours as needed for moderate pain. 05/24/22 05/24/23 Yes Franky Macho, MD  prazosin (MINIPRESS) 1 MG capsule Take 1 mg by mouth at bedtime. 07/31/21  Yes [provider]  QUEtiapine (SEROQUEL) 100 MG tablet Take 100 mg by mouth at bedtime.   Yes [provider]  rOPINIRole (REQUIP) 0.5 MG tablet Take 0.5 mg by mouth at bedtime. 08/05/21  Yes [provider]  VRAYLAR 3 MG capsule Take 3 mg by mouth every evening. 04/03/22  Yes [provider]  lidocaine (LIDODERM) 5 % Place 1 patch onto the skin daily. Remove & Discard patch within 12 hours or as directed by MD Patient not taking: Reported on 05/28/2022 07/08/21   Adron Bene, MD  naloxone [facility]) nasal spray 4 mg/0.1 mL Take in case of overdose Patient not taking: Reported on 05/11/2022 08/07/20   Long, Arlyss Repress, MD      Allergies    Morphine and Tylenol [acetaminophen]    Review of  Systems   Review of Systems  All other systems reviewed and are negative.   Physical Exam Updated Vital Signs Pulse (!) 113   Temp 98.3 F (36.8 C) (Oral)   Resp 17   Ht 5\' 3"  (1.6 m)   Wt 73.9 kg   SpO2 100%   BMI 28.87 kg/m  Physical Exam Vitals and nursing note reviewed.  Constitutional:      Appearance: He is well-developed.  HENT:     Head: Normocephalic and atraumatic.  Cardiovascular:     Rate and Rhythm: Normal rate and regular rhythm.     Heart sounds: No murmur heard. Pulmonary:     Effort: Pulmonary effort is  normal. No respiratory distress.     Breath sounds: Normal breath sounds.  Abdominal:     Palpations: Abdomen is soft.     Tenderness: There is no guarding or rebound.     Comments: Surgical sites are clean, dry, intact without significant local erythema.  There is appropriate mild lower abdominal tenderness.  Genitourinary:    Comments: Ecchymosis to the scrotum, right greater than left.  There is a small amount of ecchymosis to the penis.  There is no significant abdominal wall or inguinal ecchymosis. Musculoskeletal:        General: No tenderness.  Skin:    General: Skin is warm and dry.  Neurological:     Mental Status: He is alert and oriented to person, place, and time.  Psychiatric:        Behavior: Behavior normal.     ED Results / Procedures / Treatments   Labs (all labs ordered are listed, but only abnormal results are displayed) Labs Reviewed  COMPREHENSIVE METABOLIC PANEL  LIPASE, BLOOD  CBC WITH DIFFERENTIAL/PLATELET  URINALYSIS, ROUTINE W REFLEX MICROSCOPIC    EKG None  Radiology No results found.  Procedures Procedures  {Document cardiac monitor, telemetry assessment procedure when appropriate:1}  Medications Ordered in ED Medications  HYDROmorphone (DILAUDID) injection 1 mg (1 mg Intravenous Given 05/28/22 2332)  ondansetron (ZOFRAN) injection 4 mg (4 mg Intravenous Given 05/28/22 2330)  lactated ringers bolus 1,000 mL (1,000 mLs Intravenous New Bag/Given 05/28/22 2334)    ED Course/ Medical Decision Making/ A&P   {   Click here for ABCD2, HEART and other calculatorsREFRESH Note before signing :1}                          Medical Decision Making  ***  {Document critical care time when appropriate:1} {Document review of labs and clinical decision tools ie heart score, Chads2Vasc2 etc:1}  {Document your independent review of radiology images, and any outside records:1} {Document your discussion with family members, caretakers, and with  consultants:1} {Document social determinants of health affecting pt's care:1} {Document your decision making why or why not admission, treatments were needed:1} Final Clinical Impression(s) / ED Diagnoses Final diagnoses:  None    Rx / DC Orders ED Discharge Orders     None

## 2022-05-28 NOTE — ED Triage Notes (Signed)
Pt sts having hernia repair on 5/13. Has begun to have worsening lower abdominal pain, abdomen distended, and groin pain.

## 2022-05-29 ENCOUNTER — Emergency Department (HOSPITAL_COMMUNITY): Payer: Medicaid Other

## 2022-05-29 LAB — COMPREHENSIVE METABOLIC PANEL
ALT: 23 U/L (ref 0–44)
AST: 20 U/L (ref 15–41)
Albumin: 3.7 g/dL (ref 3.5–5.0)
Alkaline Phosphatase: 37 U/L — ABNORMAL LOW (ref 38–126)
Anion gap: 10 (ref 5–15)
BUN: 14 mg/dL (ref 6–20)
CO2: 24 mmol/L (ref 22–32)
Calcium: 8.9 mg/dL (ref 8.9–10.3)
Chloride: 103 mmol/L (ref 98–111)
Creatinine, Ser: 0.98 mg/dL (ref 0.61–1.24)
GFR, Estimated: >60 mL/min (ref >60)
Glucose, Bld: 146 mg/dL — ABNORMAL HIGH (ref 70–99)
Potassium: 3.8 mmol/L (ref 3.5–5.1)
Sodium: 137 mmol/L (ref 135–145)
Total Bilirubin: 0.4 mg/dL (ref 0.3–1.2)
Total Protein: 7.6 g/dL (ref 6.5–8.1)

## 2022-05-29 LAB — LIPASE, BLOOD: Lipase: 22 U/L (ref 11–51)

## 2022-05-29 LAB — URINALYSIS, ROUTINE W REFLEX MICROSCOPIC
Bacteria, UA: NONE SEEN
Bilirubin Urine: NEGATIVE
Glucose, UA: NEGATIVE mg/dL
Hgb urine dipstick: NEGATIVE
Ketones, ur: NEGATIVE mg/dL
Leukocytes,Ua: NEGATIVE
Nitrite: NEGATIVE
Protein, ur: NEGATIVE mg/dL
Specific Gravity, Urine: 1.029 (ref 1.005–1.030)
pH: 6 (ref 5.0–8.0)

## 2022-05-29 MED ORDER — OXYCODONE HCL 5 MG PO CAPS: 10.0000 mg | ORAL_CAPSULE | ORAL | 0 refills | Status: DC | PRN

## 2022-05-29 MED ORDER — OXYCODONE HCL 5 MG PO TABS
10.0000 mg | ORAL_TABLET | Freq: Once | ORAL | Status: AC
  Administered 2022-05-29: 10 mg via ORAL
  Filled 2022-05-29: qty 2

## 2022-05-29 MED ORDER — IOHEXOL 350 MG/ML SOLN
75.0000 mL | Freq: Once | INTRAVENOUS | Status: AC | PRN
  Administered 2022-05-29: 75 mL via INTRAVENOUS

## 2022-06-03 ENCOUNTER — Encounter: Payer: Medicaid Other | Admitting: General Surgery

## 2022-06-04 ENCOUNTER — Telehealth (INDEPENDENT_AMBULATORY_CARE_PROVIDER_SITE_OTHER): Payer: Medicaid Other | Admitting: General Surgery

## 2022-06-04 DIAGNOSIS — Z09 Encounter for follow-up examination after completed treatment for conditions other than malignant neoplasm: Secondary | ICD-10-CM

## 2022-06-04 NOTE — Telephone Encounter (Signed)
Left message

## 2022-07-29 ENCOUNTER — Emergency Department (HOSPITAL_COMMUNITY)
Admission: EM | Admit: 2022-07-29 | Discharge: 2022-07-29 | Disposition: A | Discharge status: Other Acute Inpatient Hospital | Payer: MEDICAID | Attending: Emergency Medicine | Admitting: Emergency Medicine

## 2022-07-29 ENCOUNTER — Other Ambulatory Visit: Payer: Self-pay

## 2022-07-29 ENCOUNTER — Encounter (HOSPITAL_COMMUNITY): Payer: Self-pay | Admitting: Emergency Medicine

## 2022-07-29 DIAGNOSIS — F332 Major depressive disorder, recurrent severe without psychotic features: Secondary | ICD-10-CM | POA: Diagnosis not present

## 2022-07-29 DIAGNOSIS — F17210 Nicotine dependence, cigarettes, uncomplicated: Secondary | ICD-10-CM | POA: Insufficient documentation

## 2022-07-29 DIAGNOSIS — Z7984 Long term (current) use of oral hypoglycemic drugs: Secondary | ICD-10-CM | POA: Insufficient documentation

## 2022-07-29 DIAGNOSIS — J449 Chronic obstructive pulmonary disease, unspecified: Secondary | ICD-10-CM | POA: Diagnosis not present

## 2022-07-29 DIAGNOSIS — F1994 Other psychoactive substance use, unspecified with psychoactive substance-induced mood disorder: Secondary | ICD-10-CM | POA: Insufficient documentation

## 2022-07-29 DIAGNOSIS — Z79899 Other long term (current) drug therapy: Secondary | ICD-10-CM | POA: Diagnosis not present

## 2022-07-29 DIAGNOSIS — I10 Essential (primary) hypertension: Secondary | ICD-10-CM | POA: Insufficient documentation

## 2022-07-29 DIAGNOSIS — R45851 Suicidal ideations: Secondary | ICD-10-CM | POA: Diagnosis not present

## 2022-07-29 DIAGNOSIS — E119 Type 2 diabetes mellitus without complications: Secondary | ICD-10-CM | POA: Diagnosis not present

## 2022-07-29 DIAGNOSIS — R456 Violent behavior: Secondary | ICD-10-CM | POA: Insufficient documentation

## 2022-07-29 DIAGNOSIS — F1914 Other psychoactive substance abuse with psychoactive substance-induced mood disorder: Secondary | ICD-10-CM | POA: Insufficient documentation

## 2022-07-29 LAB — COMPREHENSIVE METABOLIC PANEL
ALT: 44 U/L (ref 0–44)
AST: 39 U/L (ref 15–41)
Albumin: 3.6 g/dL (ref 3.5–5.0)
Alkaline Phosphatase: 46 U/L (ref 38–126)
Anion gap: 9 (ref 5–15)
BUN: 8 mg/dL (ref 6–20)
CO2: 23 mmol/L (ref 22–32)
Calcium: 8.5 mg/dL — ABNORMAL LOW (ref 8.9–10.3)
Chloride: 103 mmol/L (ref 98–111)
Creatinine, Ser: 0.82 mg/dL (ref 0.61–1.24)
GFR, Estimated: >60 mL/min (ref >60)
Glucose, Bld: 131 mg/dL — ABNORMAL HIGH (ref 70–99)
Potassium: 3.3 mmol/L — ABNORMAL LOW (ref 3.5–5.1)
Sodium: 135 mmol/L (ref 135–145)
Total Bilirubin: 0.4 mg/dL (ref 0.3–1.2)
Total Protein: 7.3 g/dL (ref 6.5–8.1)

## 2022-07-29 LAB — CBC
HCT: 39.5 % (ref 39.0–52.0)
Hemoglobin: 13.3 g/dL (ref 13.0–17.0)
MCH: 30.2 pg (ref 26.0–34.0)
MCHC: 33.7 g/dL (ref 30.0–36.0)
MCV: 89.8 fL (ref 80.0–100.0)
Platelets: 167 10*3/uL (ref 150–400)
RBC: 4.4 MIL/uL (ref 4.22–5.81)
RDW: 14 % (ref 11.5–15.5)
WBC: 5.7 10*3/uL (ref 4.0–10.5)
nRBC: 0 % (ref 0.0–0.2)

## 2022-07-29 LAB — RAPID URINE DRUG SCREEN, HOSP PERFORMED
Amphetamines: NOT DETECTED
Barbiturates: NOT DETECTED
Benzodiazepines: POSITIVE — AB
Cocaine: POSITIVE — AB
Opiates: NOT DETECTED
Tetrahydrocannabinol: NOT DETECTED

## 2022-07-29 LAB — ACETAMINOPHEN LEVEL: Acetaminophen (Tylenol), Serum: <10 ug/mL — ABNORMAL LOW (ref 10–30)

## 2022-07-29 LAB — ETHANOL: Alcohol, Ethyl (B): <10 mg/dL (ref <10)

## 2022-07-29 LAB — SALICYLATE LEVEL: Salicylate Lvl: <7 mg/dL — ABNORMAL LOW (ref 7.0–30.0)

## 2022-07-29 MED ORDER — PRAZOSIN HCL 1 MG PO CAPS: 1.0000 mg | ORAL_CAPSULE | Freq: Every day | ORAL | Status: DC

## 2022-07-29 MED ORDER — POTASSIUM CHLORIDE CRYS ER 20 MEQ PO TBCR
40.0000 meq | EXTENDED_RELEASE_TABLET | Freq: Once | ORAL | Status: AC
  Administered 2022-07-29: 40 meq via ORAL
  Filled 2022-07-29: qty 2

## 2022-07-29 MED ORDER — CLONIDINE HCL 0.1 MG PO TABS: 0.1000 mg | ORAL_TABLET | Freq: Two times a day (BID) | ORAL | Status: DC

## 2022-07-29 MED ORDER — CARIPRAZINE HCL 1.5 MG PO CAPS
3.0000 mg | ORAL_CAPSULE | Freq: Every evening | ORAL | Status: DC
  Filled 2022-07-29: qty 2

## 2022-07-29 MED ORDER — CLONIDINE HCL 0.1 MG PO TABS
0.1000 mg | ORAL_TABLET | Freq: Four times a day (QID) | ORAL | Status: DC
  Administered 2022-07-29: 0.1 mg via ORAL
  Filled 2022-07-29: qty 1

## 2022-07-29 MED ORDER — BUPRENORPHINE HCL-NALOXONE HCL 8-2 MG SL SUBL
2.0000 | SUBLINGUAL_TABLET | Freq: Every day | SUBLINGUAL | Status: DC
  Administered 2022-07-29: 2 via SUBLINGUAL
  Filled 2022-07-29: qty 2

## 2022-07-29 MED ORDER — METFORMIN HCL ER 750 MG PO TB24: 750.0000 mg | ORAL_TABLET | Freq: Every day | ORAL | Status: DC

## 2022-07-29 MED ORDER — QUETIAPINE FUMARATE 100 MG PO TABS: 100.0000 mg | ORAL_TABLET | Freq: Every day | ORAL | Status: DC

## 2022-07-29 MED ORDER — LORAZEPAM 1 MG PO TABS
1.0000 mg | ORAL_TABLET | Freq: Once | ORAL | Status: AC
  Administered 2022-07-29: 1 mg via ORAL
  Filled 2022-07-29: qty 1

## 2022-07-29 MED ORDER — DIVALPROEX SODIUM 250 MG PO DR TAB: 250.0000 mg | DELAYED_RELEASE_TABLET | Freq: Every day | ORAL | Status: DC
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
## 2022-07-29 NOTE — Progress Notes (Signed)
LCSW Progress Note  [id number]   Joshua Thomas [facility]  07/29/2022  1:21 PM  Description:   Inpatient Psychiatric Referral  Patient was recommended inpatient per [facility], PMHNP. There are no available beds at [facility], per [facility], RN. Patient was referred to the following out of network facilities:   Destination  Service Provider Address Phone Fax  [facility]  [address] [place] [address] [phone]  [facility] [address]  [address] [place] [address] [phone]  [facility] [address] [place]  [address], [place] [address] [phone]  [facility]  [address] [place], [place] [address] [phone]  [facility]  [address] [place] [address] [phone]  [facility]  [address] [place] [address] [phone]  [facility]  [address] [place] [address] [phone]  [facility]  [address] [place], [place] [address] [phone]  [facility]  [address] [place] [address] [phone]  [facility]  [address] [place] [address] [phone]  [facility]  [address] [place] [address] [phone]  [facility]  [address] [place] [address] [phone]  [facility]  [address], [place] [address] [phone]  [facility]  [address] [place] [address] [phone]  [facility]  [address], [place] [address] [phone]  [facility]  [address] [place] [address] [phone]  [facility]  [address] [place] [address] [phone]  [facility]  [address] [place] [address] [phone]  [facility]  [address], [place] [address] [phone]  [facility]  [address], [place] [address] [phone]  [facility]  [address] [place], [place] [address] [phone]  [facility]  [address], [place] [address] [phone]  [facility]  [address], [place] [address] [phone]  [facility]  [address] [place] [address] [phone]  [facility]  [address], [place] [address] [phone]  [facility]  [address] [place] [address] [phone]  [facility]  [address] [place], [place] [address] [phone]    Situation ongoing, CSW to continue following and update chart as more information becomes available.      Cathie Beams, LCSW  07/29/2022 1:21 PM

## 2022-07-29 NOTE — ED Notes (Addendum)
Pt belongings have been bagged and labeled and placed into the Rm 5-8 cabinets.

## 2022-07-29 NOTE — ED Notes (Signed)
Pt has been dressed out into burgundy scrubs and belongings (phone, clothing and a hat) have been collected, pt has been wanded by security

## 2022-07-29 NOTE — ED Provider Notes (Signed)
[facility].  He is currently stable for transfer.   Pricilla Loveless, MD 07/29/22 [phone]
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
## 2022-07-29 NOTE — ED Notes (Signed)
Patient belongings were placed in locker #35

## 2022-07-29 NOTE — Progress Notes (Signed)
Pt has been accepted to [facility] TODAY 07/29/2022. Bed assignment: [facility]  Pt meets inpatient criteria per Alona Bene, PMHNP  Attending Physician will be Theophilus Bones, MD  Report can be called to: [phone]  Pt can arrive anytime today; bed is ready now  Care Team Notified: Alona Bene, PMHNP and Latricia Heft, Paramedic  [place], LCSW  07/29/2022 1:43 PM

## 2022-09-03 ENCOUNTER — Encounter (HOSPITAL_COMMUNITY): Payer: Self-pay | Admitting: Emergency Medicine

## 2022-09-03 ENCOUNTER — Emergency Department (HOSPITAL_COMMUNITY)
Admission: EM | Admit: 2022-09-03 | Discharge: 2022-09-03 | Disposition: A | Discharge status: Other Acute Inpatient Hospital | Payer: MEDICAID | Attending: Emergency Medicine | Admitting: Emergency Medicine

## 2022-09-03 ENCOUNTER — Other Ambulatory Visit: Payer: Self-pay

## 2022-09-03 ENCOUNTER — Other Ambulatory Visit (HOSPITAL_COMMUNITY)
Admission: EM | Admit: 2022-09-03 | Discharge: 2022-09-16 | Disposition: A | Discharge status: Home / Self Care | Payer: MEDICAID | Source: Home / Self Care | Admitting: Nurse Practitioner

## 2022-09-03 DIAGNOSIS — Z5900 Homelessness unspecified: Secondary | ICD-10-CM | POA: Insufficient documentation

## 2022-09-03 DIAGNOSIS — F909 Attention-deficit hyperactivity disorder, unspecified type: Secondary | ICD-10-CM | POA: Insufficient documentation

## 2022-09-03 DIAGNOSIS — I10 Essential (primary) hypertension: Secondary | ICD-10-CM | POA: Insufficient documentation

## 2022-09-03 DIAGNOSIS — F1114 Opioid abuse with opioid-induced mood disorder: Secondary | ICD-10-CM | POA: Insufficient documentation

## 2022-09-03 DIAGNOSIS — F419 Anxiety disorder, unspecified: Secondary | ICD-10-CM | POA: Insufficient documentation

## 2022-09-03 DIAGNOSIS — R251 Tremor, unspecified: Secondary | ICD-10-CM | POA: Diagnosis not present

## 2022-09-03 DIAGNOSIS — Z79899 Other long term (current) drug therapy: Secondary | ICD-10-CM | POA: Insufficient documentation

## 2022-09-03 DIAGNOSIS — R45851 Suicidal ideations: Secondary | ICD-10-CM | POA: Diagnosis not present

## 2022-09-03 DIAGNOSIS — G8929 Other chronic pain: Secondary | ICD-10-CM | POA: Insufficient documentation

## 2022-09-03 DIAGNOSIS — F1010 Alcohol abuse, uncomplicated: Secondary | ICD-10-CM | POA: Insufficient documentation

## 2022-09-03 DIAGNOSIS — F1910 Other psychoactive substance abuse, uncomplicated: Secondary | ICD-10-CM | POA: Diagnosis present

## 2022-09-03 DIAGNOSIS — J449 Chronic obstructive pulmonary disease, unspecified: Secondary | ICD-10-CM | POA: Diagnosis not present

## 2022-09-03 DIAGNOSIS — Z560 Unemployment, unspecified: Secondary | ICD-10-CM | POA: Insufficient documentation

## 2022-09-03 DIAGNOSIS — F29 Unspecified psychosis not due to a substance or known physiological condition: Secondary | ICD-10-CM | POA: Diagnosis not present

## 2022-09-03 DIAGNOSIS — F3132 Bipolar disorder, current episode depressed, moderate: Secondary | ICD-10-CM | POA: Diagnosis not present

## 2022-09-03 DIAGNOSIS — F1190 Opioid use, unspecified, uncomplicated: Secondary | ICD-10-CM

## 2022-09-03 DIAGNOSIS — E119 Type 2 diabetes mellitus without complications: Secondary | ICD-10-CM | POA: Insufficient documentation

## 2022-09-03 DIAGNOSIS — F1590 Other stimulant use, unspecified, uncomplicated: Secondary | ICD-10-CM | POA: Insufficient documentation

## 2022-09-03 DIAGNOSIS — Z7984 Long term (current) use of oral hypoglycemic drugs: Secondary | ICD-10-CM | POA: Insufficient documentation

## 2022-09-03 DIAGNOSIS — F1994 Other psychoactive substance use, unspecified with psychoactive substance-induced mood disorder: Secondary | ICD-10-CM

## 2022-09-03 DIAGNOSIS — F4310 Post-traumatic stress disorder, unspecified: Secondary | ICD-10-CM | POA: Insufficient documentation

## 2022-09-03 DIAGNOSIS — F1110 Opioid abuse, uncomplicated: Secondary | ICD-10-CM | POA: Insufficient documentation

## 2022-09-03 DIAGNOSIS — F1090 Alcohol use, unspecified, uncomplicated: Secondary | ICD-10-CM

## 2022-09-03 HISTORY — DX: Other psychoactive substance abuse, uncomplicated: F19.10

## 2022-09-03 LAB — CBC WITH DIFFERENTIAL/PLATELET
Abs Immature Granulocytes: 0.02 10*3/uL (ref 0.00–0.07)
Basophils Absolute: 0 10*3/uL (ref 0.0–0.1)
Basophils Relative: 0 %
Eosinophils Absolute: 0.1 10*3/uL (ref 0.0–0.5)
Eosinophils Relative: 2 %
HCT: 44.3 % (ref 39.0–52.0)
Hemoglobin: 14.4 g/dL (ref 13.0–17.0)
Immature Granulocytes: 0 %
Lymphocytes Relative: 30 %
Lymphs Abs: 1.7 10*3/uL (ref 0.7–4.0)
MCH: 29.9 pg (ref 26.0–34.0)
MCHC: 32.5 g/dL (ref 30.0–36.0)
MCV: 92.1 fL (ref 80.0–100.0)
Monocytes Absolute: 0.3 10*3/uL (ref 0.1–1.0)
Monocytes Relative: 5 %
Neutro Abs: 3.5 10*3/uL (ref 1.7–7.7)
Neutrophils Relative %: 63 %
Platelets: 176 10*3/uL (ref 150–400)
RBC: 4.81 MIL/uL (ref 4.22–5.81)
RDW: 14.4 % (ref 11.5–15.5)
WBC: 5.5 10*3/uL (ref 4.0–10.5)
nRBC: 0 % (ref 0.0–0.2)

## 2022-09-03 LAB — COMPREHENSIVE METABOLIC PANEL
ALT: 53 U/L — ABNORMAL HIGH (ref 0–44)
AST: 32 U/L (ref 15–41)
Albumin: 3.9 g/dL (ref 3.5–5.0)
Alkaline Phosphatase: 59 U/L (ref 38–126)
Anion gap: 9 (ref 5–15)
BUN: 14 mg/dL (ref 6–20)
CO2: 24 mmol/L (ref 22–32)
Calcium: 9.4 mg/dL (ref 8.9–10.3)
Chloride: 103 mmol/L (ref 98–111)
Creatinine, Ser: 0.83 mg/dL (ref 0.61–1.24)
GFR, Estimated: >60 mL/min (ref >60)
Glucose, Bld: 273 mg/dL — ABNORMAL HIGH (ref 70–99)
Potassium: 3.4 mmol/L — ABNORMAL LOW (ref 3.5–5.1)
Sodium: 136 mmol/L (ref 135–145)
Total Bilirubin: 0.3 mg/dL (ref 0.3–1.2)
Total Protein: 7.5 g/dL (ref 6.5–8.1)

## 2022-09-03 LAB — RAPID URINE DRUG SCREEN, HOSP PERFORMED
Amphetamines: POSITIVE — AB
Barbiturates: NOT DETECTED
Benzodiazepines: POSITIVE — AB
Cocaine: POSITIVE — AB
Opiates: NOT DETECTED
Tetrahydrocannabinol: POSITIVE — AB

## 2022-09-03 LAB — ETHANOL: Alcohol, Ethyl (B): <10 mg/dL (ref <10)

## 2022-09-03 LAB — ACETAMINOPHEN LEVEL: Acetaminophen (Tylenol), Serum: <10 ug/mL — ABNORMAL LOW (ref 10–30)

## 2022-09-03 LAB — SALICYLATE LEVEL: Salicylate Lvl: <7 mg/dL — ABNORMAL LOW (ref 7.0–30.0)

## 2022-09-03 MED ORDER — LORAZEPAM 1 MG PO TABS: 0.0000 mg | ORAL_TABLET | Freq: Four times a day (QID) | ORAL | Status: DC

## 2022-09-03 MED ORDER — HYDROXYZINE HCL 25 MG PO TABS
25.0000 mg | ORAL_TABLET | Freq: Four times a day (QID) | ORAL | Status: DC | PRN
  Administered 2022-09-04: 25 mg via ORAL
  Filled 2022-09-03: qty 1

## 2022-09-03 MED ORDER — DICYCLOMINE HCL 20 MG PO TABS: 20.0000 mg | ORAL_TABLET | Freq: Four times a day (QID) | ORAL | Status: DC | PRN

## 2022-09-03 MED ORDER — LOPERAMIDE HCL 2 MG PO CAPS
2.0000 mg | ORAL_CAPSULE | ORAL | Status: DC | PRN
  Administered 2022-09-04: 2 mg via ORAL
  Filled 2022-09-03: qty 1

## 2022-09-03 MED ORDER — CLONIDINE HCL 0.1 MG PO TABS: 0.1000 mg | ORAL_TABLET | ORAL | Status: DC

## 2022-09-03 MED ORDER — IBUPROFEN 800 MG PO TABS: 800.0000 mg | ORAL_TABLET | Freq: Four times a day (QID) | ORAL | Status: DC | PRN

## 2022-09-03 MED ORDER — BUPRENORPHINE HCL-NALOXONE HCL 8-2 MG SL SUBL
1.0000 | SUBLINGUAL_TABLET | Freq: Two times a day (BID) | SUBLINGUAL | Status: DC
  Administered 2022-09-04 – 2022-09-09 (×12): 1 via SUBLINGUAL
  Filled 2022-09-03 (×12): qty 1

## 2022-09-03 MED ORDER — CALCIUM CARBONATE ANTACID 500 MG PO CHEW
1.0000 | CHEWABLE_TABLET | Freq: Every day | ORAL | Status: DC | PRN
  Administered 2022-09-04: 200 mg via ORAL
  Filled 2022-09-03: qty 1

## 2022-09-03 MED ORDER — PRAZOSIN HCL 1 MG PO CAPS
1.0000 mg | ORAL_CAPSULE | Freq: Every day | ORAL | Status: DC
  Administered 2022-09-04 – 2022-09-15 (×13): 1 mg via ORAL
  Filled 2022-09-03 (×13): qty 1

## 2022-09-03 MED ORDER — LORAZEPAM 2 MG/ML IJ SOLN
2.0000 mg | Freq: Once | INTRAMUSCULAR | Status: AC
  Administered 2022-09-03: 2 mg via INTRAVENOUS
  Filled 2022-09-03: qty 1

## 2022-09-03 MED ORDER — CLONIDINE HCL 0.1 MG PO TABS
0.1000 mg | ORAL_TABLET | Freq: Four times a day (QID) | ORAL | Status: DC
  Administered 2022-09-04 – 2022-09-05 (×5): 0.1 mg via ORAL
  Filled 2022-09-03 (×7): qty 1

## 2022-09-03 MED ORDER — METHOCARBAMOL 500 MG PO TABS
500.0000 mg | ORAL_TABLET | Freq: Three times a day (TID) | ORAL | Status: DC | PRN
  Administered 2022-09-04: 500 mg via ORAL
  Filled 2022-09-03: qty 1

## 2022-09-03 MED ORDER — LORAZEPAM 1 MG PO TABS: 0.0000 mg | ORAL_TABLET | Freq: Two times a day (BID) | ORAL | Status: DC

## 2022-09-03 MED ORDER — MAGNESIUM HYDROXIDE 400 MG/5ML PO SUSP: 30.0000 mL | Freq: Every day | ORAL | Status: DC | PRN

## 2022-09-03 MED ORDER — NAPROXEN 500 MG PO TABS: 500.0000 mg | ORAL_TABLET | Freq: Two times a day (BID) | ORAL | Status: DC | PRN

## 2022-09-03 MED ORDER — SERTRALINE HCL 100 MG PO TABS
100.0000 mg | ORAL_TABLET | Freq: Every day | ORAL | Status: DC
  Administered 2022-09-04 – 2022-09-16 (×13): 100 mg via ORAL
  Filled 2022-09-03 (×13): qty 1

## 2022-09-03 MED ORDER — ROPINIROLE HCL 0.25 MG PO TABS: 0.5000 mg | ORAL_TABLET | Freq: Every evening | ORAL | Status: DC | PRN

## 2022-09-03 MED ORDER — LORAZEPAM 1 MG PO TABS
2.0000 mg | ORAL_TABLET | Freq: Four times a day (QID) | ORAL | Status: DC
  Administered 2022-09-03: 2 mg via ORAL
  Filled 2022-09-03: qty 2

## 2022-09-03 MED ORDER — QUETIAPINE FUMARATE 100 MG PO TABS
100.0000 mg | ORAL_TABLET | Freq: Every day | ORAL | Status: DC
  Administered 2022-09-04 – 2022-09-15 (×13): 100 mg via ORAL
  Filled 2022-09-03 (×13): qty 1

## 2022-09-03 MED ORDER — METFORMIN HCL 500 MG PO TABS
250.0000 mg | ORAL_TABLET | Freq: Two times a day (BID) | ORAL | Status: DC
  Administered 2022-09-04 – 2022-09-16 (×25): 250 mg via ORAL
  Filled 2022-09-03 (×24): qty 1

## 2022-09-03 MED ORDER — LORAZEPAM 1 MG PO TABS: 2.0000 mg | ORAL_TABLET | Freq: Two times a day (BID) | ORAL | Status: DC

## 2022-09-03 MED ORDER — DIVALPROEX SODIUM 250 MG PO DR TAB
250.0000 mg | DELAYED_RELEASE_TABLET | Freq: Every day | ORAL | Status: DC
  Administered 2022-09-04 – 2022-09-15 (×13): 250 mg via ORAL
  Filled 2022-09-03: qty 1
  Filled 2022-09-03: qty 2
  Filled 2022-09-03 (×11): qty 1

## 2022-09-03 MED ORDER — CLONIDINE HCL 0.1 MG PO TABS: 0.1000 mg | ORAL_TABLET | Freq: Every day | ORAL | Status: DC

## 2022-09-03 MED ORDER — TRAZODONE HCL 50 MG PO TABS: 50.0000 mg | ORAL_TABLET | Freq: Every evening | ORAL | Status: DC | PRN

## 2022-09-03 MED ORDER — ONDANSETRON 4 MG PO TBDP: 4.0000 mg | ORAL_TABLET | Freq: Four times a day (QID) | ORAL | Status: DC | PRN

## 2022-09-03 MED ORDER — THIAMINE MONONITRATE 100 MG PO TABS
100.0000 mg | ORAL_TABLET | Freq: Every day | ORAL | Status: DC
  Administered 2022-09-03: 100 mg via ORAL
  Filled 2022-09-03: qty 1

## 2022-09-03 MED ORDER — THIAMINE HCL 100 MG/ML IJ SOLN: 100.0000 mg | Freq: Every day | INTRAMUSCULAR | Status: DC

## 2022-09-03 MED ORDER — ALUM & MAG HYDROXIDE-SIMETH 200-200-20 MG/5ML PO SUSP: 30.0000 mL | ORAL | Status: DC | PRN
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
## 2022-09-03 NOTE — ED Notes (Signed)
Verified all belongings are in pts possession prior to leaving.   Alert, oriented and ambulatory out of ED to safe transport.

## 2022-09-03 NOTE — ED Notes (Signed)
TTS in progress 

## 2022-09-03 NOTE — BH Assessment (Signed)
Clinician messaged Riki Rusk T. Burnadette Peter, RN Hey. It's Trey with TTS. Is the pt able to engage in the assessment, if so the pt will need to be placed in a private room. Is the pt under IVC? Also is the pt medically cleared?   Redmond Pulling, MS, [facility], [facility] Triage Specialist [phone]

## 2022-09-03 NOTE — ED Notes (Signed)
Safe transport notification made for transport to [facility]

## 2022-09-03 NOTE — ED Provider Notes (Signed)
[place] EMERGENCY DEPARTMENT AT [facility] Provider Note   CSN: [id number] Arrival date & time: 09/03/22  7322     History  Chief Complaint  Patient presents with   Suicidal   Withdrawal    Joshua Thomas is a 47 y.o. male.  47 year old male with prior medical history as detailed below presents for evaluation.  Patient reports abuse of both fentanyl and alcohol on a daily basis.  Patient reports that he is trying to get sober.  His last use of fentanyl was about a week ago.  Patient reports that he drinks about a pint of hard liquor daily.  His last drink was earlier this morning.  He reports passive SI.  He is concerned that he might hurt himself if he cannot get sober.  He complains of symptoms this evening concerning for alcohol withdrawal.  He feels anxious.  He complains of feeling tremulous.  He reports prior history of EtOH withdrawal.  The history is provided by the patient and medical records.       Home Medications Prior to Admission medications   Medication Sig Start Date End Date Taking? Authorizing Provider  Aspirin-Acetaminophen-Caffeine (GOODY HEADACHE PO) Take 1 packet by mouth daily as needed (for pain).    [provider]  Buprenorphine HCl-Naloxone HCl (SUBOXONE) 8-2 MG FILM Place 1 Film under the tongue 2 (two) times daily.    [provider]  Calcium Carbonate Antacid (TUMS PO) Take 2-3 tablets by mouth daily as needed (gerd).    [provider]  cloNIDine (CATAPRES) 0.1 MG tablet Take 0.1 mg by mouth 2 (two) times daily. 09/17/21   [provider]  cyclobenzaprine (FLEXERIL) 10 MG tablet Take 10 mg by mouth 2 (two) times daily as needed for muscle spasms.    [provider]  cyclobenzaprine (FLEXERIL) 5 MG tablet Take 1 tablet (5 mg total) by mouth 3 (three) times daily as needed for muscle spasms. Patient not taking: Reported on 07/29/2022 12/15/21   Benjiman Core, MD  divalproex (DEPAKOTE) 250  MG DR tablet Take 250 mg by mouth at bedtime. 03/09/22   [provider]  hydrOXYzine (VISTARIL) 50 MG capsule Take 50 mg by mouth 2 (two) times daily as needed for anxiety.    [provider]  ibuprofen (ADVIL) 200 MG tablet Take 800 mg by mouth every 6 (six) hours as needed for mild pain or headache.    [provider]  lidocaine (LIDODERM) 5 % Place 1 patch onto the skin daily. Remove & Discard patch within 12 hours or as directed by MD Patient not taking: Reported on 07/29/2022 07/08/21   Adron Bene, MD  Melatonin 10 MG TABS Take 10 mg by mouth at bedtime.    [provider]  naloxone [facility]) nasal spray 4 mg/0.1 mL Take in case of overdose Patient not taking: Reported on 07/29/2022 08/07/20   Long, Arlyss Repress, MD  oxycodone (OXY-IR) 5 MG capsule Take 2 capsules (10 mg total) by mouth every 4 (four) hours as needed. Patient not taking: Reported on 07/29/2022 05/29/22   Tilden Fossa, MD  prazosin (MINIPRESS) 1 MG capsule Take 1 mg by mouth at bedtime. 07/31/21   [provider]  QUEtiapine (SEROQUEL) 100 MG tablet Take 100 mg by mouth at bedtime.    [provider]  rOPINIRole (REQUIP) 0.5 MG tablet Take 0.5 mg by mouth at bedtime as needed (for restless legs). 08/05/21   [provider]  sertraline (ZOLOFT) 50  MG tablet Take 100 mg by mouth daily.    [provider]      Allergies    Tylenol [acetaminophen] and Morphine    Review of Systems   Review of Systems  All other systems reviewed and are negative.   Physical Exam Updated Vital Signs BP (!) 133/93   Pulse 86   Temp 98.5 F (36.9 C) (Oral)   Resp 18   Ht 5\' 3"  (1.6 m)   Wt 72.6 kg   SpO2 100%   BMI 28.34 kg/m  Physical Exam Vitals and nursing note reviewed.  Constitutional:      General: He is not in acute distress.    Appearance: Normal appearance. He is well-developed.  HENT:     Head: Normocephalic and atraumatic.  Eyes:      Conjunctiva/sclera: Conjunctivae normal.     Pupils: Pupils are equal, round, and reactive to light.  Cardiovascular:     Rate and Rhythm: Normal rate and regular rhythm.     Heart sounds: Normal heart sounds.  Pulmonary:     Effort: Pulmonary effort is normal. No respiratory distress.     Breath sounds: Normal breath sounds.  Abdominal:     General: There is no distension.     Palpations: Abdomen is soft.     Tenderness: There is no abdominal tenderness.  Musculoskeletal:        General: No deformity. Normal range of motion.     Cervical back: Normal range of motion and neck supple.  Skin:    General: Skin is warm and dry.  Neurological:     General: No focal deficit present.     Mental Status: He is alert and oriented to person, place, and time.  Psychiatric:     Comments: Endorses passive SI without plan     ED Results / Procedures / Treatments   Labs (all labs ordered are listed, but only abnormal results are displayed) Labs Reviewed  CBC WITH DIFFERENTIAL/PLATELET  ETHANOL  COMPREHENSIVE METABOLIC PANEL  ACETAMINOPHEN LEVEL  SALICYLATE LEVEL    EKG EKG Interpretation Date/Time:  Friday September 03 2022 18:33:08 EDT Ventricular Rate:  79 PR Interval:  138 QRS Duration:  112 QT Interval:  387 QTC Calculation: 444 R Axis:   74  Text Interpretation: Sinus rhythm Incomplete right bundle branch block Left ventricular hypertrophy Anterior Q waves, possibly due to LVH Minimal ST elevation, lateral leads Confirmed by Kristine Royal [phone]) on 09/03/2022 6:36:37 PM  Radiology No results found.  Procedures Procedures    Medications Ordered in ED Medications  LORazepam (ATIVAN) injection 2 mg (has no administration in time range)    ED Course/ Medical Decision Making/ A&P                                 Medical Decision Making Amount and/or Complexity of Data Reviewed Labs: ordered.  Risk OTC drugs. Prescription drug management.    Medical Screen  Complete  This patient presented to the ED with complaint of concern for withdrawal.  This complaint involves an extensive number of treatment options. The initial differential diagnosis includes, but is not limited to, alcohol withdrawal, metabolic abnormality, suicidal ideation, etc.  This presentation is: Acute, Chronic, Self-Limited, Previously Undiagnosed, Uncertain Prognosis, Complicated, Systemic Symptoms, and Threat to Life/Bodily Function  Patient with self-reported history of polysubstance abuse.  Patient desires sobriety.  Patient presented with concern for possible alcohol withdrawal.  Patient also admits to passive SI without specific plan.  Patient without complaint of other medical concern.  Patient is medically clear at this time for TTS/psych evaluation.  Patient without clear evidence of significant alcohol withdrawal syndrome on evaluation presently.  Problem List / ED Course:  Suicidal ideation, alcohol abuse, fentanyl abuse   Reevaluation:  After the interventions noted above, I reevaluated the patient and found that they have: improved  Disposition:  After consideration of the diagnostic results and the patients response to treatment, I feel that the patent would benefit from psych evaluation.          Final Clinical Impression(s) / ED Diagnoses Final diagnoses:  Suicidal ideations    Rx / DC Orders ED Discharge Orders     None         Wynetta Fines, MD 09/03/22 2110

## 2022-09-03 NOTE — ED Triage Notes (Signed)
Pt to ER via EMS with c/o ETOH withdrawal and fentanyl.  States last fentanyl was a week ago and last ETOH was this AM.  Pt reports daily ETOH.  Pt reports SI.

## 2022-09-04 LAB — HEMOGLOBIN A1C
Hgb A1c MFr Bld: 7.3 % — ABNORMAL HIGH (ref 4.8–5.6)
Mean Plasma Glucose: 162.81 mg/dL

## 2022-09-04 LAB — VALPROIC ACID LEVEL: Valproic Acid Lvl: <10 ug/mL — ABNORMAL LOW (ref 50.0–100.0)

## 2022-09-04 LAB — POTASSIUM: Potassium: 4 mmol/L (ref 3.5–5.1)

## 2022-09-04 MED ORDER — NICOTINE 21 MG/24HR TD PT24
21.0000 mg | MEDICATED_PATCH | Freq: Every day | TRANSDERMAL | Status: DC
  Administered 2022-09-04 – 2022-09-16 (×13): 21 mg via TRANSDERMAL
  Filled 2022-09-04 (×13): qty 1

## 2022-09-04 NOTE — ED Notes (Signed)
Patient awake and alert on unit.  He is calm and cooperative with care.  No distress or active signs of withdrawal.  Tolerating suboxone.  Will monitor.

## 2022-09-04 NOTE — Group Note (Signed)
Group Topic: Communication  Group Date: 09/04/2022 Start Time: 1800 End Time: 1820 Facilitators: Vonzell Schlatter B  Department: [facility]  Number of Participants: 3  Group Focus: communication Treatment Modality:  Psychoeducation Interventions utilized were support Purpose: express feelings and increase insight  Name: Joshua Thomas Date of Birth: 30-Jan-1975  MR: [id number]    Level of Participation: active Quality of Participation: attentive and cooperative Interactions with others: gave feedback Mood/Affect: positive Triggers (if applicable): n/a Cognition: coherent/clear Progress: Moderate Response: n/a Plan: follow-up needed  Patients Problems:  Patient Active Problem List   Diagnosis Date Noted   Polysubstance abuse (HCC) 09/03/2022   Substance induced mood disorder (HCC) 07/29/2022   Right inguinal hernia 05/24/2022   Abdominal adhesions 05/24/2022   Epidural abscess 10/08/2021   Diabetes mellitus (HCC) 10/08/2021   Bilateral low back pain with left-sided sciatica    Acute osteomyelitis (HCC) 07/04/2021   Opioid use disorder, severe, dependence (HCC) 12/15/2020   Hepatitis C 12/15/2020   MDD (major depressive disorder), recurrent severe, 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

## 2022-09-04 NOTE — Group Note (Signed)
Group Topic: Recovery Basics  Group Date: 09/04/2022 Start Time: 1600 End Time: 1620 Facilitators: Jenean Lindau, RN  Department: [facility]  Number of Participants: 3  Group Focus: chemical dependency education Treatment Modality:  Behavior Modification Therapy Interventions utilized were patient education Purpose: increase insight  Name: Joshua Thomas Date of Birth: Oct 11, 1975  MR: [id number]    Level of Participation: active Quality of Participation: attentive Interactions with others: gave feedback Mood/Affect: appropriate Triggers (if applicable):   Cognition: coherent/clear Progress: Gaining insight Response:   Plan: follow-up needed  Patients Problems:  Patient Active Problem List   Diagnosis Date Noted   Polysubstance abuse (HCC) 09/03/2022   Substance induced mood disorder (HCC) 07/29/2022   Right inguinal hernia 05/24/2022   Abdominal adhesions 05/24/2022   Epidural abscess 10/08/2021   Diabetes mellitus (HCC) 10/08/2021   Bilateral low back pain with left-sided sciatica    Acute osteomyelitis (HCC) 07/04/2021   Opioid use disorder, severe, dependence (HCC) 12/15/2020   Hepatitis C 12/15/2020   MDD (major depressive disorder), recurrent severe, 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

## 2022-09-04 NOTE — ED Notes (Signed)
Patient is sleeping. Respirations equal and unlabored, skin warm and dry. No change in assessment or acuity. Routine safety checks conducted according to facility protocol. Will continue to monitor for safety.   

## 2022-09-04 NOTE — ED Notes (Signed)
Patient is in the bedroom sleeping currently. NAD, respirations even and unlabored. Will continue to monitor for safety.

## 2022-09-04 NOTE — ED Notes (Signed)
Pt is A & O x 4.Admission process is completed. Pt is oriented to the unit and provided with meal. Medication has been administered to him and pt is lying on his bed. Pt denies SI/HI/AVH. Will continue to monitor for safety and provide support.

## 2022-09-04 NOTE — ED Provider Notes (Signed)
Facility Based Crisis Admission H&P  Date: 09/04/22 Patient Name: Joshua Thomas MRN: [id number] Chief Complaint: substance abuse  Diagnoses:  Final diagnoses:  Polysubstance abuse (HCC)  Opioid use disorder  Bipolar affective disorder, currently depressed, moderate (HCC)    HPI: Joshua Thomas is a 47 y/o male with a history of PTSD, depression, substance abuse, bipolar disorder, adhd,  fentanyl use, methamphetamine use, cocaine use and stimulant use presenting to [facility] voluntarily and unaccompanied after presenting to [facility]. Patient states that he has been using fentanyl and he wants treatment to stop. Patient reports that he uses about 1/2 gram of fentanyl intravenously daily and about a pint of alcohol daily.   Nurse practitioner assessed patient face to face and reviewed his chart. Patient is alert oriented x4, calm and cooperative, speech is clear and coherent, thought process is logical and goal, denies any suicidal ideation or auditory or visual hallucinations, mania, paranoia or delusions. Patient does not appear to be responding to any internal or external stimuli at this time.   Patient reports that he relapsed on fentanyl, crack cocaine, alcohol and methamphetamine after being sober for about a year. Patient is currently being followed by Rozann Lesches who has been prescribing patient psychiatric medications. Patient reports taking Seroquel 100 mg, prazosin 1 mg, Requip 0.5 mg, Zoloft 100 mg, metformin 250 mg, clonidine 0.1 mg, Depakote DR 250 mg and Suboxone 8 mg-2mg  sl.  Patient reports that his medications was in his mother's car and he lost the car.  Patient reports that his mother passed the car back and his medications are still in the car but he has not been able to get them. Patient states that he would like to have long-term substance abuse treatment. Patient has expressed an interest in [facility] but states that if he was accepted to  the facility he does not have the money to get to [place].   Patient will be admitted to [facility] for crisis management, stabilization and safety for treatment of polysubstance abuse.   PHQ 2-9:  Flowsheet Row ED from 09/03/2022 in [facility] Office Visit from 03/14/2018 in [facility] Office Visit from 02/28/2018 in [facility]  Thoughts that you would be better off dead, or of hurting yourself in some way Not at all Not at all Several days  PHQ-9 Total Score 5 15 18        Flowsheet Row ED from 09/03/2022 in [facility] Most recent reading at 09/04/2022 12:44 AM ED from 09/03/2022 in [facility] Emergency Department at [facility] Most recent reading at 09/03/2022  6:33 PM ED from 07/29/2022 in [facility] Emergency Department at [facility] Most recent reading at 07/29/2022  8:25 AM  C-SSRS RISK CATEGORY Error: Q3, 4, or 5 should not be populated when Q2 is No High Risk High Risk         Total Time spent with patient: 20 minutes  Musculoskeletal  Strength & Muscle Tone: within normal limits Gait & Station: normal Patient leans: N/A  Psychiatric Specialty Exam  Presentation General Appearance:  Disheveled  Eye Contact: Good  Speech: Clear and Coherent  Speech Volume: Normal  Handedness: Right   Mood and Affect  Mood: Anxious  Affect: Congruent   Thought Process  Thought Processes: Coherent; Linear  Descriptions of Associations:Intact  Orientation:Full (Time, Place and Person)  Thought Content:Logical  Diagnosis of Schizophrenia or  Schizoaffective disorder in past: No  Duration of Psychotic Symptoms: Greater than six months  Hallucinations:Hallucinations: None  Ideas of Reference:None  Suicidal Thoughts:Suicidal Thoughts: Yes, Passive SI Passive Intent and/or Plan: Without Intent; Without Plan  Homicidal Thoughts:Homicidal  Thoughts: No   Sensorium  Memory: Immediate Fair; Recent Fair; Remote Fair  Judgment: Intact  Insight: Poor   Executive Functions  Concentration: Fair  Attention Span: Fair  Recall: [employer] of Knowledge: Fair  Language: Fair   Psychomotor Activity  Psychomotor Activity: Psychomotor Activity: Normal   Assets  Assets: Manufacturing systems engineer; Desire for Improvement; Resilience   Sleep  Sleep: Sleep: Poor Number of Hours of Sleep: 4   Nutritional Assessment (For OBS and FBC admissions only) Has the patient had a weight loss or gain of 10 pounds or more in the last 3 months?: No Has the patient had a decrease in food intake/or appetite?: No Does the patient have dental problems?: No Does the patient have eating habits or behaviors that may be indicators of an eating disorder including binging or inducing vomiting?: No Has the patient recently lost weight without trying?: 0 Has the patient been eating poorly because of a decreased appetite?: 0 Malnutrition Screening Tool Score: 0    Physical Exam HENT:     Head: Normocephalic and atraumatic.     Nose: Nose normal.  Eyes:     Pupils: Pupils are equal, round, and reactive to light.  Cardiovascular:     Rate and Rhythm: Normal rate.  Pulmonary:     Effort: Pulmonary effort is normal.  Abdominal:     General: Abdomen is flat.  Musculoskeletal:        General: Normal range of motion.     Cervical back: Normal range of motion.  Skin:    General: Skin is warm.  Neurological:     Mental Status: He is alert and oriented to person, place, and time.  Psychiatric:        Attention and Perception: Attention normal.        Mood and Affect: Mood is anxious and depressed.        Speech: Speech normal.        Behavior: Behavior is cooperative.        Thought Content: Thought content is not paranoid or delusional. Thought content does not include homicidal or suicidal ideation. Thought content does not  include homicidal or suicidal plan.        Cognition and Memory: Cognition normal.        Judgment: Judgment is impulsive.   Review of Systems  Constitutional: Negative.   HENT: Negative.    Eyes: Negative.   Respiratory: Negative.    Cardiovascular: Negative.   Gastrointestinal: Negative.   Genitourinary: Negative.   Musculoskeletal: Negative.   Skin: Negative.   Neurological: Negative.   Endo/Heme/Allergies: Negative.   Psychiatric/Behavioral:  Positive for depression and substance abuse. The patient is nervous/anxious.     Blood pressure (!) 138/96, pulse 89, temperature 98.1 F (36.7 C), temperature source Oral, resp. rate 18, SpO2 100%. There is no height or weight on file to calculate BMI.  Past Psychiatric History: Bipolar Disorder, depression, ADHD, opioid use disorder, PTSD  Is the patient at risk to self? No  Has the patient been a risk to self in the past 6 months? No .    Has the patient been a risk to self within the distant past? No   Is the patient a risk to others? No  Has the patient been a risk to others in the past 6 months? No   Has the patient been a risk to others within the distant past? No   Past Medical History: COPD, chronic backn pain, degenerative disc disease, Hep C, hypertension, sciatica Family History: Father-alcohol abuse, cirrhosis, brother committed suicide Social History: 47 y/o male, homeless and unemployed  Last Labs:  Admission on 09/03/2022, Discharged on 09/03/2022  Component Date Value Ref Range Status   WBC 09/03/2022 5.5  4.0 - 10.5 K/uL Final   RBC 09/03/2022 4.81  4.22 - 5.81 MIL/uL Final   Hemoglobin 09/03/2022 14.4  13.0 - 17.0 g/dL Final   HCT 16/10/9602 44.3  39.0 - 52.0 % Final   MCV 09/03/2022 92.1  80.0 - 100.0 fL Final   MCH 09/03/2022 29.9  26.0 - 34.0 pg Final   MCHC 09/03/2022 32.5  30.0 - 36.0 g/dL Final   RDW 54/09/8117 14.4  11.5 - 15.5 % Final   Platelets 09/03/2022 176  150 - 400 K/uL Final   nRBC 09/03/2022  0.0  0.0 - 0.2 % Final   Neutrophils Relative % 09/03/2022 63  % Final   Neutro Abs 09/03/2022 3.5  1.7 - 7.7 K/uL Final   Lymphocytes Relative 09/03/2022 30  % Final   Lymphs Abs 09/03/2022 1.7  0.7 - 4.0 K/uL Final   Monocytes Relative 09/03/2022 5  % Final   Monocytes Absolute 09/03/2022 0.3  0.1 - 1.0 K/uL Final   Eosinophils Relative 09/03/2022 2  % Final   Eosinophils Absolute 09/03/2022 0.1  0.0 - 0.5 K/uL Final   Basophils Relative 09/03/2022 0  % Final   Basophils Absolute 09/03/2022 0.0  0.0 - 0.1 K/uL Final   Immature Granulocytes 09/03/2022 0  % Final   Abs Immature Granulocytes 09/03/2022 0.02  0.00 - 0.07 K/uL Final   Performed at [name], [address], [place], [place] [address]   Alcohol, Ethyl (B) 09/03/2022 <10  <10 mg/dL Final   Comment: (NOTE) Lowest detectable limit for serum alcohol is 10 mg/dL.  For medical purposes only. Performed at [facility], [address], [place], [place] [address]    Sodium 09/03/2022 136  135 - 145 mmol/L Final   Potassium 09/03/2022 3.4 (L)  3.5 - 5.1 mmol/L Final   Chloride 09/03/2022 103  98 - 111 mmol/L Final   CO2 09/03/2022 24  22 - 32 mmol/L Final   Glucose, Bld 09/03/2022 273 (H)  70 - 99 mg/dL Final   Glucose reference range applies only to samples taken after fasting for at least 8 hours.   BUN 09/03/2022 14  6 - 20 mg/dL Final   Creatinine, Ser 09/03/2022 0.83  0.61 - 1.24 mg/dL Final   Calcium 30/86/5784 9.4  8.9 - 10.3 mg/dL Final   Total Protein 69/62/9528 7.5  6.5 - 8.1 g/dL Final   Albumin 41/32/4401 3.9  3.5 - 5.0 g/dL Final   AST 02/72/5366 32  15 - 41 U/L Final   ALT 09/03/2022 53 (H)  0 - 44 U/L Final   Alkaline Phosphatase 09/03/2022 59  38 - 126 U/L Final   Total Bilirubin 09/03/2022 0.3  0.3 - 1.2 mg/dL Final   GFR, Estimated 09/03/2022 >60  >60 mL/min Final   Comment: (NOTE) Calculated using the CKD-EPI Creatinine Equation (2021)    Anion gap 09/03/2022 9  5  - 15 Final   Performed at [facility], [address]  [place], [place] [address]   Acetaminophen (Tylenol), Serum 09/03/2022 <10 (L)  10 - 30 ug/mL Final   Comment: (NOTE) Therapeutic concentrations vary significantly. A range of 10-30 ug/mL  may be an effective concentration for many patients. However, some  are best treated at concentrations outside of this range. Acetaminophen concentrations >150 ug/mL at 4 hours after ingestion  and >50 ug/mL at 12 hours after ingestion are often associated with  toxic reactions.  Performed at [facility], [address], [place], [place] [address]    Salicylate Lvl 09/03/2022 <7.0 (L)  7.0 - 30.0 mg/dL Final   Performed at [facility], [address], [place], [place] [address]   Opiates 09/03/2022 NONE DETECTED  NONE DETECTED Final   Cocaine 09/03/2022 POSITIVE (A)  NONE DETECTED Final   Benzodiazepines 09/03/2022 POSITIVE (A)  NONE DETECTED Final   Amphetamines 09/03/2022 POSITIVE (A)  NONE DETECTED Final   Tetrahydrocannabinol 09/03/2022 POSITIVE (A)  NONE DETECTED Final   Barbiturates 09/03/2022 NONE DETECTED  NONE DETECTED Final   Comment: (NOTE) DRUG SCREEN FOR MEDICAL PURPOSES ONLY.  IF CONFIRMATION IS NEEDED FOR ANY PURPOSE, NOTIFY LAB WITHIN 5 DAYS.  LOWEST DETECTABLE LIMITS FOR URINE DRUG SCREEN Drug Class                     Cutoff (ng/mL) Amphetamine and metabolites    1000 Barbiturate and metabolites    200 Benzodiazepine                 200 Opiates and metabolites        300 Cocaine and metabolites        300 THC                            50 Performed at [facility], [address], [place], [place] [address]   Admission on 07/29/2022, Discharged on 07/29/2022  Component Date Value Ref Range Status   Sodium 07/29/2022 135  135 - 145 mmol/L Final   Potassium 07/29/2022 3.3 (L)  3.5 - 5.1 mmol/L Final   Chloride 07/29/2022 103  98 - 111  mmol/L Final   CO2 07/29/2022 23  22 - 32 mmol/L Final   Glucose, Bld 07/29/2022 131 (H)  70 - 99 mg/dL Final   Glucose reference range applies only to samples taken after fasting for at least 8 hours.   BUN 07/29/2022 8  6 - 20 mg/dL Final   Creatinine, Ser 07/29/2022 0.82  0.61 - 1.24 mg/dL Final   Calcium 21/30/8657 8.5 (L)  8.9 - 10.3 mg/dL Final   Total Protein 84/69/6295 7.3  6.5 - 8.1 g/dL Final   Albumin 28/41/3244 3.6  3.5 - 5.0 g/dL Final   AST 01/13/7251 39  15 - 41 U/L Final   ALT 07/29/2022 44  0 - 44 U/L Final   Alkaline Phosphatase 07/29/2022 46  38 - 126 U/L Final   Total Bilirubin 07/29/2022 0.4  0.3 - 1.2 mg/dL Final   GFR, Estimated 07/29/2022 >60  >60 mL/min Final   Comment: (NOTE) Calculated using the CKD-EPI Creatinine Equation (2021)    Anion gap 07/29/2022 9  5 - 15 Final   Performed at [facility], [address], [place], [place] [address]   Alcohol, Ethyl (B) 07/29/2022 <10  <10 mg/dL Final   Comment: (NOTE) Lowest detectable limit for serum alcohol is 10 mg/dL.  For medical  purposes only. Performed at [facility], [address] [place], [place] [address]    Salicylate Lvl 07/29/2022 <7.0 (L)  7.0 - 30.0 mg/dL Final   Performed at [facility], [address] [place], [place] [address]   Acetaminophen (Tylenol), Serum 07/29/2022 <10 (L)  10 - 30 ug/mL Final   Comment: (NOTE) Therapeutic concentrations vary significantly. A range of 10-30 ug/mL  may be an effective concentration for many patients. However, some  are best treated at concentrations outside of this range. Acetaminophen concentrations >150 ug/mL at 4 hours after ingestion  and >50 ug/mL at 12 hours after ingestion are often associated with  toxic reactions.  Performed at [facility], [address] [place], [place] [address]    WBC 07/29/2022 5.7  4.0 - 10.5 K/uL Final   RBC 07/29/2022 4.40  4.22 - 5.81  MIL/uL Final   Hemoglobin 07/29/2022 13.3  13.0 - 17.0 g/dL Final   HCT 11/07/2534 39.5  39.0 - 52.0 % Final   MCV 07/29/2022 89.8  80.0 - 100.0 fL Final   MCH 07/29/2022 30.2  26.0 - 34.0 pg Final   MCHC 07/29/2022 33.7  30.0 - 36.0 g/dL Final   RDW 64/40/3474 14.0  11.5 - 15.5 % Final   Platelets 07/29/2022 167  150 - 400 K/uL Final   nRBC 07/29/2022 0.0  0.0 - 0.2 % Final   Performed at [facility], [address] [place], [place] [address]   Opiates 07/29/2022 NONE DETECTED  NONE DETECTED Final   Cocaine 07/29/2022 POSITIVE (A)  NONE DETECTED Final   Benzodiazepines 07/29/2022 POSITIVE (A)  NONE DETECTED Final   Amphetamines 07/29/2022 NONE DETECTED  NONE DETECTED Final   Tetrahydrocannabinol 07/29/2022 NONE DETECTED  NONE DETECTED Final   Barbiturates 07/29/2022 NONE DETECTED  NONE DETECTED Final   Comment: (NOTE) DRUG SCREEN FOR MEDICAL PURPOSES ONLY.  IF CONFIRMATION IS NEEDED FOR ANY PURPOSE, NOTIFY LAB WITHIN 5 DAYS.  LOWEST DETECTABLE LIMITS FOR URINE DRUG SCREEN Drug Class                     Cutoff (ng/mL) Amphetamine and metabolites    1000 Barbiturate and metabolites    200 Benzodiazepine                 200 Opiates and metabolites        300 Cocaine and metabolites        300 THC                            50 Performed at [facility], [address] [place], [place] [address]   Admission on 05/28/2022, Discharged on 05/29/2022  Component Date Value Ref Range Status   Sodium 05/28/2022 137  135 - 145 mmol/L Final   Potassium 05/28/2022 3.8  3.5 - 5.1 mmol/L Final   Chloride 05/28/2022 103  98 - 111 mmol/L Final   CO2 05/28/2022 24  22 - 32 mmol/L Final   Glucose, Bld 05/28/2022 146 (H)  70 - 99 mg/dL Final   Glucose reference range applies only to samples taken after fasting for at least 8 hours.   BUN 05/28/2022 14  6 - 20 mg/dL Final   Creatinine, Ser 05/28/2022 0.98  0.61 - 1.24 mg/dL Final   Calcium 43/32/9518 8.9   8.9 - 10.3 mg/dL Final   Total Protein 84/16/6063 7.6  6.5 - 8.1 g/dL Final   Albumin 46/96/2952 3.7  3.5 - 5.0 g/dL Final   AST 84/13/2440 20  15 - 41 U/L Final   ALT 05/28/2022 23  0 - 44 U/L Final   Alkaline Phosphatase 05/28/2022 37 (L)  38 - 126 U/L Final   Total Bilirubin 05/28/2022 0.4  0.3 - 1.2 mg/dL Final   GFR, Estimated 05/28/2022 >60  >60 mL/min Final   Comment: (NOTE) Calculated using the CKD-EPI Creatinine Equation (2021)    Anion gap 05/28/2022 10  5 - 15 Final   Performed at [facility], [address] [place], [place] [address]   Lipase 05/28/2022 22  11 - 51 U/L Final   Performed at [facility], [address] [place], [place] [address]   WBC 05/28/2022 5.5  4.0 - 10.5 K/uL Final   RBC 05/28/2022 4.73  4.22 - 5.81 MIL/uL Final   Hemoglobin 05/28/2022 14.1  13.0 - 17.0 g/dL Final   HCT 40/34/7425 43.4  39.0 - 52.0 % Final   MCV 05/28/2022 91.8  80.0 - 100.0 fL Final   MCH 05/28/2022 29.8  26.0 - 34.0 pg Final   MCHC 05/28/2022 32.5  30.0 - 36.0 g/dL Final   RDW 95/63/8756 14.0  11.5 - 15.5 % Final   Platelets 05/28/2022 193  150 - 400 K/uL Final   nRBC 05/28/2022 0.0  0.0 - 0.2 % Final   Neutrophils Relative % 05/28/2022 48  % Final   Neutro Abs 05/28/2022 2.7  1.7 - 7.7 K/uL Final   Lymphocytes Relative 05/28/2022 41  % Final   Lymphs Abs 05/28/2022 2.2  0.7 - 4.0 K/uL Final   Monocytes Relative 05/28/2022 8  % Final   Monocytes Absolute 05/28/2022 0.4  0.1 - 1.0 K/uL Final   Eosinophils Relative 05/28/2022 3  % Final   Eosinophils Absolute 05/28/2022 0.2  0.0 - 0.5 K/uL Final   Basophils Relative 05/28/2022 0  % Final   Basophils Absolute 05/28/2022 0.0  0.0 - 0.1 K/uL Final   Immature Granulocytes 05/28/2022 0  % Final   Abs Immature Granulocytes 05/28/2022 0.02  0.00 - 0.07 K/uL Final   Performed at [facility], [address] [place], [place] [address]   Color, Urine 05/29/2022 STRAW (A)  YELLOW Final   APPearance 05/29/2022 CLEAR   CLEAR Final   Specific Gravity, Urine 05/29/2022 1.029  1.005 - 1.030 Final   pH 05/29/2022 6.0  5.0 - 8.0 Final   Glucose, UA 05/29/2022 NEGATIVE  NEGATIVE mg/dL Final   Hgb urine dipstick 05/29/2022 NEGATIVE  NEGATIVE Final   Bilirubin Urine 05/29/2022 NEGATIVE  NEGATIVE Final   Ketones, ur 05/29/2022 NEGATIVE  NEGATIVE mg/dL Final   Protein, ur 51/88/4166 NEGATIVE  NEGATIVE mg/dL Final   Nitrite 07/11/1599 NEGATIVE  NEGATIVE Final   Leukocytes,Ua 05/29/2022 NEGATIVE  NEGATIVE Final   RBC / HPF 05/29/2022 0-5  0 - 5 RBC/hpf Final   WBC, UA 05/29/2022 0-5  0 - 5 WBC/hpf Final   Bacteria, UA 05/29/2022 NONE SEEN  NONE SEEN Final   Squamous Epithelial / HPF 05/29/2022 0-5  0 - 5 /HPF Final   Performed at [facility], [address], [place], [place] [address]  Admission on 05/24/2022, Discharged on 05/24/2022  Component Date Value Ref Range Status   Glucose-Capillary 05/24/2022 151 (H)  70 - 99 mg/dL Final   Glucose reference range applies only to samples taken after fasting for  at least 8 hours.   Glucose-Capillary 05/24/2022 175 (H)  70 - 99 mg/dL Final   Glucose reference range applies only to samples taken after fasting for at least 8 hours.  Hospital Outpatient Visit on 05/21/2022  Component Date Value Ref Range Status   WBC 05/21/2022 3.7 (L)  4.0 - 10.5 K/uL Final   RBC 05/21/2022 4.43  4.22 - 5.81 MIL/uL Final   Hemoglobin 05/21/2022 13.4  13.0 - 17.0 g/dL Final   HCT 52/84/1324 40.5  39.0 - 52.0 % Final   MCV 05/21/2022 91.4  80.0 - 100.0 fL Final   MCH 05/21/2022 30.2  26.0 - 34.0 pg Final   MCHC 05/21/2022 33.1  30.0 - 36.0 g/dL Final   RDW 40/10/2723 14.6  11.5 - 15.5 % Final   Platelets 05/21/2022 130 (L)  150 - 400 K/uL Final   nRBC 05/21/2022 0.0  0.0 - 0.2 % Final   Neutrophils Relative % 05/21/2022 44  % Final   Neutro Abs 05/21/2022 1.6 (L)  1.7 - 7.7 K/uL Final   Lymphocytes Relative 05/21/2022 45  % Final   Lymphs Abs 05/21/2022 1.6  0.7 - 4.0 K/uL Final    Monocytes Relative 05/21/2022 6  % Final   Monocytes Absolute 05/21/2022 0.2  0.1 - 1.0 K/uL Final   Eosinophils Relative 05/21/2022 5  % Final   Eosinophils Absolute 05/21/2022 0.2  0.0 - 0.5 K/uL Final   Basophils Relative 05/21/2022 0  % Final   Basophils Absolute 05/21/2022 0.0  0.0 - 0.1 K/uL Final   Immature Granulocytes 05/21/2022 0  % Final   Abs Immature Granulocytes 05/21/2022 0.01  0.00 - 0.07 K/uL Final   Performed at [facility], [address], [place], [place] [address]   Hgb A1c MFr Bld 05/21/2022 6.7 (H)  4.8 - 5.6 % Final   Comment: (NOTE) Pre diabetes:          5.7%-6.4%  Diabetes:              >6.4%  Glycemic control for   <7.0% adults with diabetes    Mean Plasma Glucose 05/21/2022 145.59  mg/dL Final   Performed at [facility], [address], [place], [place] [address]   Sodium 05/21/2022 133 (L)  135 - 145 mmol/L Final   Potassium 05/21/2022 3.9  3.5 - 5.1 mmol/L Final   Chloride 05/21/2022 102  98 - 111 mmol/L Final   CO2 05/21/2022 22  22 - 32 mmol/L Final   Glucose, Bld 05/21/2022 242 (H)  70 - 99 mg/dL Final   Glucose reference range applies only to samples taken after fasting for at least 8 hours.   BUN 05/21/2022 22 (H)  6 - 20 mg/dL Final   Creatinine, Ser 05/21/2022 0.93  0.61 - 1.24 mg/dL Final   Calcium 25/95/6387 9.1  8.9 - 10.3 mg/dL Final   Total Protein 56/43/3295 7.3  6.5 - 8.1 g/dL Final   Albumin 18/84/1660 3.6  3.5 - 5.0 g/dL Final   AST 63/01/6008 20  15 - 41 U/L Final   ALT 05/21/2022 36  0 - 44 U/L Final   Alkaline Phosphatase 05/21/2022 41  38 - 126 U/L Final   Total Bilirubin 05/21/2022 0.8  0.3 - 1.2 mg/dL Final   GFR, Estimated 05/21/2022 >60  >60 mL/min Final   Comment: (NOTE) Calculated using the CKD-EPI Creatinine Equation (2021)    Anion gap 05/21/2022 9  5 - 15 Final   Performed at  [facility], [address], [place], [place] [address]   Prothrombin Time 05/21/2022 13.7  11.4 - 15.2 seconds Final   INR  05/21/2022 1.0  0.8 - 1.2 Final   Comment: (NOTE) INR goal varies based on device and disease states. Performed at [facility] [address] [place], [place] [address]    Opiates 05/21/2022 NONE DETECTED  NONE DETECTED Final   Cocaine 05/21/2022 NONE DETECTED  NONE DETECTED Final   Benzodiazepines 05/21/2022 NONE DETECTED  NONE DETECTED Final   Amphetamines 05/21/2022 NONE DETECTED  NONE DETECTED Final   Tetrahydrocannabinol 05/21/2022 NONE DETECTED  NONE DETECTED Final   Barbiturates 05/21/2022 NONE DETECTED  NONE DETECTED Final   Comment: (NOTE) DRUG SCREEN FOR MEDICAL PURPOSES ONLY.  IF CONFIRMATION IS NEEDED FOR ANY PURPOSE, NOTIFY LAB WITHIN 5 DAYS.  LOWEST DETECTABLE LIMITS FOR URINE DRUG SCREEN Drug Class                     Cutoff (ng/mL) Amphetamine and metabolites    1000 Barbiturate and metabolites    200 Benzodiazepine                 200 Opiates and metabolites        300 Cocaine and metabolites        300 THC                            50 Performed at [facility], [address], [place], [place] [address]     Allergies: Tylenol [acetaminophen] and Morphine  Medications:  Facility Ordered Medications  Medication   [COMPLETED] LORazepam (ATIVAN) injection 2 mg   alum & mag hydroxide-simeth (MAALOX/MYLANTA) 200-200-20 MG/5ML suspension 30 mL   magnesium hydroxide (MILK OF MAGNESIA) suspension 30 mL   traZODone (DESYREL) tablet 50 mg   dicyclomine (BENTYL) tablet 20 mg   hydrOXYzine (ATARAX) tablet 25 mg   loperamide (IMODIUM) capsule 2-4 mg   methocarbamol (ROBAXIN) tablet 500 mg   naproxen (NAPROSYN) tablet 500 mg   ondansetron (ZOFRAN-ODT) disintegrating tablet 4 mg   cloNIDine (CATAPRES) tablet 0.1 mg   Followed by   Melene Muller ON 09/06/2022] cloNIDine (CATAPRES) tablet 0.1 mg   Followed by   Melene Muller ON 09/09/2022] cloNIDine (CATAPRES) tablet 0.1 mg   sertraline (ZOLOFT) tablet 100 mg   rOPINIRole (REQUIP) tablet 0.5 mg   QUEtiapine (SEROQUEL) tablet 100  mg   prazosin (MINIPRESS) capsule 1 mg   divalproex (DEPAKOTE) DR tablet 250 mg   calcium carbonate (TUMS - dosed in mg elemental calcium) chewable tablet 200 mg of elemental calcium   ibuprofen (ADVIL) tablet 800 mg   buprenorphine-naloxone (SUBOXONE) 8-2 mg per SL tablet 1 tablet   metFORMIN (GLUCOPHAGE) tablet 250 mg   PTA Medications  Medication Sig   naloxone (NARCAN) nasal spray 4 mg/0.1 mL Take in case of overdose   Calcium Carbonate Antacid (TUMS PO) Take 2-3 tablets by mouth daily as needed (gerd).   QUEtiapine (SEROQUEL) 100 MG tablet Take 100 mg by mouth at bedtime.   prazosin (MINIPRESS) 1 MG capsule Take 1 mg by mouth at bedtime.   rOPINIRole (REQUIP) 0.5 MG tablet Take 0.5 mg by mouth at bedtime as needed (for restless legs).   Melatonin 10 MG TABS Take 10 mg by mouth at bedtime.   cloNIDine (CATAPRES) 0.1 MG tablet Take 0.1 mg by mouth 2 (two) times daily.   divalproex (DEPAKOTE) 250 MG DR tablet Take  250 mg by mouth at bedtime.   Buprenorphine HCl-Naloxone HCl (SUBOXONE) 8-2 MG FILM Place 1 Film under the tongue 2 (two) times daily.   cyclobenzaprine (FLEXERIL) 10 MG tablet Take 10 mg by mouth 2 (two) times daily as needed for muscle spasms.   ibuprofen (ADVIL) 200 MG tablet Take 800 mg by mouth every 6 (six) hours as needed for mild pain or headache.   hydrOXYzine (VISTARIL) 50 MG capsule Take 50 mg by mouth 2 (two) times daily as needed for anxiety.   sertraline (ZOLOFT) 50 MG tablet Take 100 mg by mouth daily.   Aspirin-Acetaminophen-Caffeine (GOODY HEADACHE PO) Take 1 packet by mouth daily as needed (for pain).    Long Term Goals: Improvement in symptoms so as ready for discharge  Short Term Goals: Patient will verbalize feelings in meetings with treatment team members., Patient will attend at least of 50% of the groups daily., Pt will complete the PHQ9 on admission, day 3 and discharge., and Patient will take medications as prescribed daily.  Medical Decision Making   Naida Sleight is a 47 y/o male with a history of PTSD, depression, substance abuse, bipolar disorder, adhd,  fentanyl use, methamphetamine use, cocaine use and stimulant use presenting to [facility] voluntarily after presenting to [facility]. Patient states that he has been using fentanyl and he wants treatment to stop. Patient reports that he uses about 1/2 gram of fentanyl intravenously daily and about a pint of alcohol daily.     Recommendations  Based on my evaluation the patient does not appear to have an emergency medical condition. Patient will be admitted to [facility] for crisis management, stabilization and safety for treatment of polysubstance abuse.   Jasper Riling, NP 09/04/22  1:20 AM

## 2022-09-04 NOTE — ED Provider Notes (Signed)
Behavioral Health Progress Note  Date and Time: 09/04/2022 3:14 PM Name: Joshua Thomas MRN:  [id number]  Subjective:   The patient is a 47 year old male with a history of substance-induced mood disorder, as well as opioid use disorder and alcohol use disorder.  He presented to the [facility] behavioral health urgent care requesting help with opioid withdrawal.  It is also notable that the patient reports drinking 1 pint of liquor per day, his last drink being several days previously.  He has requested residential rehab placement and desires to go to [facility] treatment center.  The patient is seen in 2 points today, first in the morning with this provider, and for the second time in the afternoon with the attending psychiatrist present.  At the morning interview, the patient was irritable and lying in bed.  Later in the day he was noted to be more animated and even somewhat hyperactive.  The patient reports experiencing withdrawal symptoms consisting of diaphoresis and myalgias.  It appears that his withdrawal symptomatology is consistent with opioid withdrawal.  He has responded somewhat well to the clonidine detox protocol.  The patient denies auditory/visual hallucinations.  The patient reports good mood, appetite, and sleep. They deny suicidal and homicidal thoughts. The patient denies side effects from their medications.    Diagnosis:  Final diagnoses:  Opioid use disorder  Substance induced mood disorder (HCC)  Alcohol use disorder    Total Time spent with patient: 20 minutes  Past Psychiatric History: as above Past Medical History: as above Family History: none Family Psychiatric  History: none Social History: as above and per H and P  Additional Social History:  See H and P                  Sleep: Fair  Appetite:  Fair   Current Medications:  Current Facility-Administered Medications  Medication Dose Route Frequency Provider Last Rate Last Admin   alum &  mag hydroxide-simeth (MAALOX/MYLANTA) 200-200-20 MG/5ML suspension 30 mL  30 mL Oral Q4H PRN Bobbitt, Shalon E, NP       buprenorphine-naloxone (SUBOXONE) 8-2 mg per SL tablet 1 tablet  1 tablet Sublingual BID Bobbitt, Shalon E, NP   1 tablet at 09/04/22 0847   calcium carbonate (TUMS - dosed in mg elemental calcium) chewable tablet 200 mg of elemental calcium  1 tablet Oral Daily PRN Bobbitt, Shalon E, NP   200 mg of elemental calcium at 09/04/22 0050   cloNIDine (CATAPRES) tablet 0.1 mg  0.1 mg Oral QID Bobbitt, Shalon E, NP   0.1 mg at 09/04/22 1318   Followed by   Melene Muller ON 09/06/2022] cloNIDine (CATAPRES) tablet 0.1 mg  0.1 mg Oral BH-qamhs Bobbitt, Shalon E, NP       Followed by   Melene Muller ON 09/09/2022] cloNIDine (CATAPRES) tablet 0.1 mg  0.1 mg Oral QAC breakfast Bobbitt, Shalon E, NP       dicyclomine (BENTYL) tablet 20 mg  20 mg Oral Q6H PRN Bobbitt, Shalon E, NP       divalproex (DEPAKOTE) DR tablet 250 mg  250 mg Oral QHS Bobbitt, Shalon E, NP   250 mg at 09/04/22 0021   hydrOXYzine (ATARAX) tablet 25 mg  25 mg Oral Q6H PRN Bobbitt, Shalon E, NP   25 mg at 09/04/22 1001   ibuprofen (ADVIL) tablet 800 mg  800 mg Oral Q6H PRN Bobbitt, Shalon E, NP       loperamide (IMODIUM) capsule 2-4 mg  2-4 mg  Oral PRN Bobbitt, Shalon E, NP   2 mg at 09/04/22 0021   magnesium hydroxide (MILK OF MAGNESIA) suspension 30 mL  30 mL Oral Daily PRN Bobbitt, Shalon E, NP       metFORMIN (GLUCOPHAGE) tablet 250 mg  250 mg Oral BID WC Bobbitt, Shalon E, NP   250 mg at 09/04/22 0845   methocarbamol (ROBAXIN) tablet 500 mg  500 mg Oral Q8H PRN Bobbitt, Shalon E, NP       naproxen (NAPROSYN) tablet 500 mg  500 mg Oral BID PRN Bobbitt, Shalon E, NP       nicotine (NICODERM CQ - dosed in mg/24 hours) patch 21 mg  21 mg Transdermal Daily Carlyn Reichert, MD   21 mg at 09/04/22 1433   ondansetron (ZOFRAN-ODT) disintegrating tablet 4 mg  4 mg Oral Q6H PRN Bobbitt, Shalon E, NP       prazosin (MINIPRESS) capsule 1 mg  1 mg  Oral QHS Bobbitt, Shalon E, NP   1 mg at 09/04/22 0021   QUEtiapine (SEROQUEL) tablet 100 mg  100 mg Oral QHS Bobbitt, Shalon E, NP   100 mg at 09/04/22 0021   rOPINIRole (REQUIP) tablet 0.5 mg  0.5 mg Oral QHS PRN Bobbitt, Shalon E, NP       sertraline (ZOLOFT) tablet 100 mg  100 mg Oral Daily Bobbitt, Shalon E, NP   100 mg at 09/04/22 9629   traZODone (DESYREL) tablet 50 mg  50 mg Oral QHS PRN Bobbitt, Shalon E, NP       Current Outpatient Medications  Medication Sig Dispense Refill   Aspirin-Acetaminophen-Caffeine (GOODY HEADACHE PO) Take 1 packet by mouth daily as needed (for pain).     Buprenorphine HCl-Naloxone HCl (SUBOXONE) 8-2 MG FILM Place 1 Film under the tongue 2 (two) times daily.     Calcium Carbonate Antacid (TUMS PO) Take 2-3 tablets by mouth daily as needed (gerd).     cloNIDine (CATAPRES) 0.1 MG tablet Take 0.1 mg by mouth 2 (two) times daily.     cyclobenzaprine (FLEXERIL) 10 MG tablet Take 10 mg by mouth 2 (two) times daily as needed for muscle spasms.     divalproex (DEPAKOTE) 250 MG DR tablet Take 250 mg by mouth at bedtime.     hydrOXYzine (VISTARIL) 50 MG capsule Take 50 mg by mouth 2 (two) times daily as needed for anxiety.     ibuprofen (ADVIL) 200 MG tablet Take 800 mg by mouth every 6 (six) hours as needed for mild pain or headache.     Melatonin 10 MG TABS Take 10 mg by mouth at bedtime.     naloxone (NARCAN) nasal spray 4 mg/0.1 mL Take in case of overdose 1 each 0   prazosin (MINIPRESS) 1 MG capsule Take 1 mg by mouth at bedtime.     QUEtiapine (SEROQUEL) 100 MG tablet Take 100 mg by mouth at bedtime.     rOPINIRole (REQUIP) 0.5 MG tablet Take 0.5 mg by mouth at bedtime as needed (for restless legs).     sertraline (ZOLOFT) 50 MG tablet Take 100 mg by mouth daily.      Labs  Lab Results:  Admission on 09/03/2022  Component Date Value Ref Range Status   Valproic Acid Lvl 09/04/2022 <10 (L)  50.0 - 100.0 ug/mL Final   Comment: RESULT CONFIRMED BY MANUAL  DILUTION Performed at [facility], [address] [place], [place] [address]   Admission on 09/03/2022, Discharged on 09/03/2022  Component Date Value Ref Range Status   WBC 09/03/2022 5.5  4.0 - 10.5 K/uL Final   RBC 09/03/2022 4.81  4.22 - 5.81 MIL/uL Final   Hemoglobin 09/03/2022 14.4  13.0 - 17.0 g/dL Final   HCT 78/29/5621 44.3  39.0 - 52.0 % Final   MCV 09/03/2022 92.1  80.0 - 100.0 fL Final   MCH 09/03/2022 29.9  26.0 - 34.0 pg Final   MCHC 09/03/2022 32.5  30.0 - 36.0 g/dL Final   RDW 30/86/5784 14.4  11.5 - 15.5 % Final   Platelets 09/03/2022 176  150 - 400 K/uL Final   nRBC 09/03/2022 0.0  0.0 - 0.2 % Final   Neutrophils Relative % 09/03/2022 63  % Final   Neutro Abs 09/03/2022 3.5  1.7 - 7.7 K/uL Final   Lymphocytes Relative 09/03/2022 30  % Final   Lymphs Abs 09/03/2022 1.7  0.7 - 4.0 K/uL Final   Monocytes Relative 09/03/2022 5  % Final   Monocytes Absolute 09/03/2022 0.3  0.1 - 1.0 K/uL Final   Eosinophils Relative 09/03/2022 2  % Final   Eosinophils Absolute 09/03/2022 0.1  0.0 - 0.5 K/uL Final   Basophils Relative 09/03/2022 0  % Final   Basophils Absolute 09/03/2022 0.0  0.0 - 0.1 K/uL Final   Immature Granulocytes 09/03/2022 0  % Final   Abs Immature Granulocytes 09/03/2022 0.02  0.00 - 0.07 K/uL Final   Performed at [facility], [address], [place], [place] [address]   Alcohol, Ethyl (B) 09/03/2022 <10  <10 mg/dL Final   Comment: (NOTE) Lowest detectable limit for serum alcohol is 10 mg/dL.  For medical purposes only. Performed at [facility], [address], [place], [place] [address]    Sodium 09/03/2022 136  135 - 145 mmol/L Final   Potassium 09/03/2022 3.4 (L)  3.5 - 5.1 mmol/L Final   Chloride 09/03/2022 103  98 - 111 mmol/L Final   CO2 09/03/2022 24  22 - 32 mmol/L Final   Glucose, Bld 09/03/2022 273 (H)  70 - 99 mg/dL Final   Glucose reference range applies only to samples taken after fasting for at  least 8 hours.   BUN 09/03/2022 14  6 - 20 mg/dL Final   Creatinine, Ser 09/03/2022 0.83  0.61 - 1.24 mg/dL Final   Calcium 32/44/0102 9.4  8.9 - 10.3 mg/dL Final   Total Protein 72/53/6644 7.5  6.5 - 8.1 g/dL Final   Albumin 03/47/4259 3.9  3.5 - 5.0 g/dL Final   AST 56/38/7564 32  15 - 41 U/L Final   ALT 09/03/2022 53 (H)  0 - 44 U/L Final   Alkaline Phosphatase 09/03/2022 59  38 - 126 U/L Final   Total Bilirubin 09/03/2022 0.3  0.3 - 1.2 mg/dL Final   GFR, Estimated 09/03/2022 >60  >60 mL/min Final   Comment: (NOTE) Calculated using the CKD-EPI Creatinine Equation (2021)    Anion gap 09/03/2022 9  5 - 15 Final   Performed at [facility], [address], [place], [place] [address]   Acetaminophen (Tylenol), Serum 09/03/2022 <10 (L)  10 - 30 ug/mL Final   Comment: (NOTE) Therapeutic concentrations vary significantly. A range of 10-30 ug/mL  may be an effective concentration for many patients. However, some  are best treated at concentrations outside of this range. Acetaminophen concentrations >150 ug/mL at 4 hours after ingestion  and >50 ug/mL at 12 hours after ingestion are often associated with  toxic reactions.  Performed at [facility], [address] [place], [place] [address]    Salicylate Lvl 09/03/2022 <7.0 (L)  7.0 - 30.0 mg/dL Final   Performed at [facility], [address], [place], [place] [address]   Opiates 09/03/2022 NONE DETECTED  NONE DETECTED Final   Cocaine 09/03/2022 POSITIVE (A)  NONE DETECTED Final   Benzodiazepines 09/03/2022 POSITIVE (A)  NONE DETECTED Final   Amphetamines 09/03/2022 POSITIVE (A)  NONE DETECTED Final   Tetrahydrocannabinol 09/03/2022 POSITIVE (A)  NONE DETECTED Final   Barbiturates 09/03/2022 NONE DETECTED  NONE DETECTED Final   Comment: (NOTE) DRUG SCREEN FOR MEDICAL PURPOSES ONLY.  IF CONFIRMATION IS NEEDED FOR ANY PURPOSE, NOTIFY LAB WITHIN 5 DAYS.  LOWEST DETECTABLE  LIMITS FOR URINE DRUG SCREEN Drug Class                     Cutoff (ng/mL) Amphetamine and metabolites    1000 Barbiturate and metabolites    200 Benzodiazepine                 200 Opiates and metabolites        300 Cocaine and metabolites        300 THC                            50 Performed at [facility], [address], [place], [place] [address]   Admission on 07/29/2022, Discharged on 07/29/2022  Component Date Value Ref Range Status   Sodium 07/29/2022 135  135 - 145 mmol/L Final   Potassium 07/29/2022 3.3 (L)  3.5 - 5.1 mmol/L Final   Chloride 07/29/2022 103  98 - 111 mmol/L Final   CO2 07/29/2022 23  22 - 32 mmol/L Final   Glucose, Bld 07/29/2022 131 (H)  70 - 99 mg/dL Final   Glucose reference range applies only to samples taken after fasting for at least 8 hours.   BUN 07/29/2022 8  6 - 20 mg/dL Final   Creatinine, Ser 07/29/2022 0.82  0.61 - 1.24 mg/dL Final   Calcium 46/96/2952 8.5 (L)  8.9 - 10.3 mg/dL Final   Total Protein 84/13/2440 7.3  6.5 - 8.1 g/dL Final   Albumin 11/07/2534 3.6  3.5 - 5.0 g/dL Final   AST 64/40/3474 39  15 - 41 U/L Final   ALT 07/29/2022 44  0 - 44 U/L Final   Alkaline Phosphatase 07/29/2022 46  38 - 126 U/L Final   Total Bilirubin 07/29/2022 0.4  0.3 - 1.2 mg/dL Final   GFR, Estimated 07/29/2022 >60  >60 mL/min Final   Comment: (NOTE) Calculated using the CKD-EPI Creatinine Equation (2021)    Anion gap 07/29/2022 9  5 - 15 Final   Performed at [facility], [address], [place], [place] [address]   Alcohol, Ethyl (B) 07/29/2022 <10  <10 mg/dL Final   Comment: (NOTE) Lowest detectable limit for serum alcohol is 10 mg/dL.  For medical purposes only. Performed at [facility], [address] [place] [address], [place], [place] [address]    Salicylate Lvl 07/29/2022 <7.0 (L)  7.0 - 30.0 mg/dL Final   Performed at [facility], [address], [place], [place] [address]    Acetaminophen (Tylenol), Serum 07/29/2022 <10 (L)  10 - 30 ug/mL Final   Comment: (NOTE) Therapeutic concentrations vary significantly. A range of 10-30 ug/mL  may  be an effective concentration for many patients. However, some  are best treated at concentrations outside of this range. Acetaminophen concentrations >150 ug/mL at 4 hours after ingestion  and >50 ug/mL at 12 hours after ingestion are often associated with  toxic reactions.  Performed at [facility], [address], [place], [place] [address]    WBC 07/29/2022 5.7  4.0 - 10.5 K/uL Final   RBC 07/29/2022 4.40  4.22 - 5.81 MIL/uL Final   Hemoglobin 07/29/2022 13.3  13.0 - 17.0 g/dL Final   HCT 21/30/8657 39.5  39.0 - 52.0 % Final   MCV 07/29/2022 89.8  80.0 - 100.0 fL Final   MCH 07/29/2022 30.2  26.0 - 34.0 pg Final   MCHC 07/29/2022 33.7  30.0 - 36.0 g/dL Final   RDW 84/69/6295 14.0  11.5 - 15.5 % Final   Platelets 07/29/2022 167  150 - 400 K/uL Final   nRBC 07/29/2022 0.0  0.0 - 0.2 % Final   Performed at [facility], [address], [place], [place] [address]   Opiates 07/29/2022 NONE DETECTED  NONE DETECTED Final   Cocaine 07/29/2022 POSITIVE (A)  NONE DETECTED Final   Benzodiazepines 07/29/2022 POSITIVE (A)  NONE DETECTED Final   Amphetamines 07/29/2022 NONE DETECTED  NONE DETECTED Final   Tetrahydrocannabinol 07/29/2022 NONE DETECTED  NONE DETECTED Final   Barbiturates 07/29/2022 NONE DETECTED  NONE DETECTED Final   Comment: (NOTE) DRUG SCREEN FOR MEDICAL PURPOSES ONLY.  IF CONFIRMATION IS NEEDED FOR ANY PURPOSE, NOTIFY LAB WITHIN 5 DAYS.  LOWEST DETECTABLE LIMITS FOR URINE DRUG SCREEN Drug Class                     Cutoff (ng/mL) Amphetamine and metabolites    1000 Barbiturate and metabolites    200 Benzodiazepine                 200 Opiates and metabolites        300 Cocaine and metabolites        300 THC                            50 Performed at [facility], [address], [place], [place] [address]   Admission on 05/28/2022, Discharged on 05/29/2022  Component Date Value Ref Range Status   Sodium 05/28/2022 137  135 - 145 mmol/L Final   Potassium 05/28/2022 3.8  3.5 - 5.1 mmol/L Final   Chloride 05/28/2022 103  98 - 111 mmol/L Final   CO2 05/28/2022 24  22 - 32 mmol/L Final   Glucose, Bld 05/28/2022 146 (H)  70 - 99 mg/dL Final   Glucose reference range applies only to samples taken after fasting for at least 8 hours.   BUN 05/28/2022 14  6 - 20 mg/dL Final   Creatinine, Ser 05/28/2022 0.98  0.61 - 1.24 mg/dL Final   Calcium 02/72/5366 8.9  8.9 - 10.3 mg/dL Final   Total Protein 44/03/4740 7.6  6.5 - 8.1 g/dL Final   Albumin 59/56/3875 3.7  3.5 - 5.0 g/dL Final   AST 64/33/2951 20  15 - 41 U/L Final   ALT 05/28/2022 23  0 - 44 U/L Final   Alkaline Phosphatase 05/28/2022 37 (L)  38 - 126 U/L Final   Total Bilirubin 05/28/2022 0.4  0.3 - 1.2 mg/dL Final   GFR, Estimated 05/28/2022 >60  >60 mL/min Final  Comment: (NOTE) Calculated using the CKD-EPI Creatinine Equation (2021)    Anion gap 05/28/2022 10  5 - 15 Final   Performed at [facility], [address] [place], [place] [address]   Lipase 05/28/2022 22  11 - 51 U/L Final   Performed at [facility], [address] [place], [place] [address]   WBC 05/28/2022 5.5  4.0 - 10.5 K/uL Final   RBC 05/28/2022 4.73  4.22 - 5.81 MIL/uL Final   Hemoglobin 05/28/2022 14.1  13.0 - 17.0 g/dL Final   HCT 91/47/8295 43.4  39.0 - 52.0 % Final   MCV 05/28/2022 91.8  80.0 - 100.0 fL Final   MCH 05/28/2022 29.8  26.0 - 34.0 pg Final   MCHC 05/28/2022 32.5  30.0 - 36.0 g/dL Final   RDW 62/13/0865 14.0  11.5 - 15.5 % Final   Platelets 05/28/2022 193  150 - 400 K/uL Final   nRBC 05/28/2022 0.0  0.0 - 0.2 % Final   Neutrophils Relative % 05/28/2022 48  % Final   Neutro Abs 05/28/2022 2.7  1.7 - 7.7 K/uL Final   Lymphocytes Relative 05/28/2022 41  % Final   Lymphs  Abs 05/28/2022 2.2  0.7 - 4.0 K/uL Final   Monocytes Relative 05/28/2022 8  % Final   Monocytes Absolute 05/28/2022 0.4  0.1 - 1.0 K/uL Final   Eosinophils Relative 05/28/2022 3  % Final   Eosinophils Absolute 05/28/2022 0.2  0.0 - 0.5 K/uL Final   Basophils Relative 05/28/2022 0  % Final   Basophils Absolute 05/28/2022 0.0  0.0 - 0.1 K/uL Final   Immature Granulocytes 05/28/2022 0  % Final   Abs Immature Granulocytes 05/28/2022 0.02  0.00 - 0.07 K/uL Final   Performed at [facility], [address] [place], [place] [address]   Color, Urine 05/29/2022 STRAW (A)  YELLOW Final   APPearance 05/29/2022 CLEAR  CLEAR Final   Specific Gravity, Urine 05/29/2022 1.029  1.005 - 1.030 Final   pH 05/29/2022 6.0  5.0 - 8.0 Final   Glucose, UA 05/29/2022 NEGATIVE  NEGATIVE mg/dL Final   Hgb urine dipstick 05/29/2022 NEGATIVE  NEGATIVE Final   Bilirubin Urine 05/29/2022 NEGATIVE  NEGATIVE Final   Ketones, ur 05/29/2022 NEGATIVE  NEGATIVE mg/dL Final   Protein, ur 62/95/2841 NEGATIVE  NEGATIVE mg/dL Final   Nitrite 32/44/0102 NEGATIVE  NEGATIVE Final   Leukocytes,Ua 05/29/2022 NEGATIVE  NEGATIVE Final   RBC / HPF 05/29/2022 0-5  0 - 5 RBC/hpf Final   WBC, UA 05/29/2022 0-5  0 - 5 WBC/hpf Final   Bacteria, UA 05/29/2022 NONE SEEN  NONE SEEN Final   Squamous Epithelial / HPF 05/29/2022 0-5  0 - 5 /HPF Final   Performed at [facility], [address], [place], [place] [address]  Admission on 05/24/2022, Discharged on 05/24/2022  Component Date Value Ref Range Status   Glucose-Capillary 05/24/2022 151 (H)  70 - 99 mg/dL Final   Glucose reference range applies only to samples taken after fasting for at least 8 hours.   Glucose-Capillary 05/24/2022 175 (H)  70 - 99 mg/dL Final   Glucose reference range applies only to samples taken after fasting for at least 8 hours.  Hospital Outpatient Visit on 05/21/2022  Component Date Value Ref Range Status   WBC 05/21/2022 3.7 (L)  4.0 - 10.5 K/uL  Final   RBC 05/21/2022 4.43  4.22 - 5.81 MIL/uL Final   Hemoglobin 05/21/2022 13.4  13.0 - 17.0 g/dL Final   HCT 81/19/1478 40.5  39.0 - 52.0 % Final   MCV 05/21/2022 91.4  80.0 - 100.0 fL Final   MCH 05/21/2022 30.2  26.0 - 34.0 pg Final   MCHC 05/21/2022 33.1  30.0 - 36.0 g/dL Final   RDW 29/56/2130 14.6  11.5 - 15.5 % Final   Platelets 05/21/2022 130 (L)  150 - 400 K/uL Final   nRBC 05/21/2022 0.0  0.0 - 0.2 % Final   Neutrophils Relative % 05/21/2022 44  % Final   Neutro Abs 05/21/2022 1.6 (L)  1.7 - 7.7 K/uL Final   Lymphocytes Relative 05/21/2022 45  % Final   Lymphs Abs 05/21/2022 1.6  0.7 - 4.0 K/uL Final   Monocytes Relative 05/21/2022 6  % Final   Monocytes Absolute 05/21/2022 0.2  0.1 - 1.0 K/uL Final   Eosinophils Relative 05/21/2022 5  % Final   Eosinophils Absolute 05/21/2022 0.2  0.0 - 0.5 K/uL Final   Basophils Relative 05/21/2022 0  % Final   Basophils Absolute 05/21/2022 0.0  0.0 - 0.1 K/uL Final   Immature Granulocytes 05/21/2022 0  % Final   Abs Immature Granulocytes 05/21/2022 0.01  0.00 - 0.07 K/uL Final   Performed at [facility], [address], [place], [place] [address]   Hgb A1c MFr Bld 05/21/2022 6.7 (H)  4.8 - 5.6 % Final   Comment: (NOTE) Pre diabetes:          5.7%-6.4%  Diabetes:              >6.4%  Glycemic control for   <7.0% adults with diabetes    Mean Plasma Glucose 05/21/2022 145.59  mg/dL Final   Performed at [facility], [address] [place], [place] [address]   Sodium 05/21/2022 133 (L)  135 - 145 mmol/L Final   Potassium 05/21/2022 3.9  3.5 - 5.1 mmol/L Final   Chloride 05/21/2022 102  98 - 111 mmol/L Final   CO2 05/21/2022 22  22 - 32 mmol/L Final   Glucose, Bld 05/21/2022 242 (H)  70 - 99 mg/dL Final   Glucose reference range applies only to samples taken after fasting for at least 8 hours.   BUN 05/21/2022 22 (H)  6 - 20 mg/dL Final   Creatinine, Ser 05/21/2022 0.93  0.61 - 1.24 mg/dL Final   Calcium 95/28/4132 9.1  8.9  - 10.3 mg/dL Final   Total Protein 44/01/270 7.3  6.5 - 8.1 g/dL Final   Albumin 53/66/4403 3.6  3.5 - 5.0 g/dL Final   AST 47/42/5956 20  15 - 41 U/L Final   ALT 05/21/2022 36  0 - 44 U/L Final   Alkaline Phosphatase 05/21/2022 41  38 - 126 U/L Final   Total Bilirubin 05/21/2022 0.8  0.3 - 1.2 mg/dL Final   GFR, Estimated 05/21/2022 >60  >60 mL/min Final   Comment: (NOTE) Calculated using the CKD-EPI Creatinine Equation (2021)    Anion gap 05/21/2022 9  5 - 15 Final   Performed at [facility], [address] [place], [place] [address]   Prothrombin Time 05/21/2022 13.7  11.4 - 15.2 seconds Final   INR 05/21/2022 1.0  0.8 - 1.2 Final   Comment: (NOTE) INR goal varies based on device and disease states. Performed at [facility], [address] [place], [place] [address]    Opiates 05/21/2022 NONE DETECTED  NONE DETECTED Final   Cocaine 05/21/2022 NONE DETECTED  NONE DETECTED  Final   Benzodiazepines 05/21/2022 NONE DETECTED  NONE DETECTED Final   Amphetamines 05/21/2022 NONE DETECTED  NONE DETECTED Final   Tetrahydrocannabinol 05/21/2022 NONE DETECTED  NONE DETECTED Final   Barbiturates 05/21/2022 NONE DETECTED  NONE DETECTED Final   Comment: (NOTE) DRUG SCREEN FOR MEDICAL PURPOSES ONLY.  IF CONFIRMATION IS NEEDED FOR ANY PURPOSE, NOTIFY LAB WITHIN 5 DAYS.  LOWEST DETECTABLE LIMITS FOR URINE DRUG SCREEN Drug Class                     Cutoff (ng/mL) Amphetamine and metabolites    1000 Barbiturate and metabolites    200 Benzodiazepine                 200 Opiates and metabolites        300 Cocaine and metabolites        300 THC                            50 Performed at [facility], [address] [place], [place] [address]     Blood Alcohol level:  Lab Results  Component Value Date   [facility] <10 09/03/2022   ETH <10 07/29/2022    Metabolic Disorder Labs: Lab Results  Component Value Date   HGBA1C 6.7 (H) 05/21/2022   MPG 145.59 05/21/2022   MPG 157.07 10/08/2021    Lab Results  Component Value Date   PROLACTIN 31.3 (H) 05/23/2015   Lab Results  Component Value Date   CHOL 129 12/09/2020   TRIG 84 12/09/2020   HDL 29 (L) 12/09/2020   CHOLHDL 4.4 12/09/2020   VLDL 17 12/09/2020   LDLCALC 83 12/09/2020   LDLCALC 49 10/08/2020    Therapeutic Lab Levels: No results found for: "LITHIUM" Lab Results  Component Value Date   VALPROATE <10 (L) 09/04/2022   No results found for: "CBMZ"  Physical Findings   AIMS    Flowsheet Row [facility]) [facility]-Admission (Discharged) [facility] (Discharged) [facility]  AIMS Total Score 0 0 0      AUDIT    Flowsheet Row Ad[facility]) [facility] (Discharged[facility]-Admission (Discharged) from [facility] (Discharged) [facility]  Alcohol Use Disorder Identification Test Final Score (AUDIT) 1 1 6  0      GAD-7    Flowsheet Row Office Visit from 01/31/2018 in [facility] Office Visit from 12/13/2017 in [facility] Office Visit from 11/29/2017 in [facility]  Total GAD-7 Score 19 19 6       PHQ2-9    Flowsheet Row ED from 09/03/2022 in [facility] Office Visit from 03/14/2018 in [facility] Office Visit from 02/28/2018 in [facility] Office Visit from 02/14/2018 in [facility] Office Visit from 01/31/2018 in [facility]  PHQ-2 Total Score 2 2 3 6 4   PHQ-9 Total Score 5 15 18 23  17       Flowsheet Row ED from 09/03/2022 in [facility] Most recent reading  at 09/04/2022  2:01 AM ED from 09/03/2022 in [facility] Emergency Department at [facility] Most recent reading at 09/03/2022  6:33 PM ED from 07/29/2022 in [facility] Emergency Department at [facility] Most recent reading at 07/29/2022  8:25 AM  C-SSRS RISK CATEGORY No Risk High Risk High Risk        Musculoskeletal  Strength & Muscle Tone: within normal limits Gait & Station: normal Patient leans: N/A  Psychiatric Specialty Exam  Presentation General Appearance: Appropriate for Environment  Eye Contact:Fair  Speech:Clear and Coherent  Speech Volume:Normal  Handedness:-- (not assessed)   Mood and Affect  Mood:Euthymic  Affect:Congruent   Thought Process  Thought Processes:Coherent; Linear  Descriptions of Associations:Intact  Orientation:Full (Time, Place and Person)  Thought Content:Logical    Hallucinations:Hallucinations: None  Ideas of Reference:None  Suicidal Thoughts:Suicidal Thoughts: No  Homicidal Thoughts:Homicidal Thoughts: No   Sensorium  Memory:Immediate Fair; Recent Fair; Remote Fair  Judgment:Fair  Insight:Fair   Executive Functions  Concentration:Fair  Attention Span:Fair  Recall:Fair  Fund of Knowledge:Fair  Language:Fair   Psychomotor Activity  Psychomotor Activity:Psychomotor Activity: Normal   Assets  Assets:Communication Skills; Resilience   Sleep  Sleep:Sleep: Fair   Nutritional Assessment (For [facility] and [facility] admissions only) Has the patient had a weight loss or gain of 10 pounds or more in the last 3 months?: No Has the patient had a decrease in food intake/or appetite?: Yes Does the patient have dental problems?: No Does the patient have eating habits or behaviors that may be indicators of an eating disorder including binging or inducing vomiting?: No Has the patient recently lost weight  without trying?: 0 Has the patient been eating poorly because of a decreased appetite?: 0 Malnutrition Screening Tool Score: 0    Physical Exam Constitutional:      Appearance: the patient is not toxic-appearing.  Pulmonary:     Effort: Pulmonary effort is normal.  Neurological:     General: No focal deficit present.     Mental Status: the patient is alert and oriented to person, place, and time.   Review of Systems  Gastrointestinal:  Negative for abdominal pain, constipation, diarrhea, nausea and vomiting.  Neurological:  Negative for headaches.    BP 125/74   Pulse 80   Temp 97.8 F (36.6 C)   Resp 16   SpO2 100%   Assessment and Plan:  Opioid use disorder, currently in withdrawal - Continue outpatient Suboxone regimen of 8 mg twice daily (may have some component of precipitated withdrawal presently) - The patient has consistent fills for Suboxone from 1 prescriber. - COWS and clonidine protocol - Residential rehab placement  Substance-induced mood disorder - Continue Depakote 250 mg daily, Depakote level was less than 10, likely secondary to noncompliance - Continue Seroquel 100 mg nightly - Continue Zoloft 100 mg daily - Discontinue Requip 0.5 mg.  It appears the patient has not filled this medication in several months.   Carlyn Reichert, MD 09/04/2022 3:14 PM

## 2022-09-05 NOTE — Group Note (Signed)
Group Topic: Communication  Group Date: 09/05/2022 Start Time: 0900 End Time: 1000 Facilitators: Fortunato Curling, LPN; Prentice Docker, RN  Department: [facility]  Number of Participants: 6  Group Focus: nursing group Treatment Modality:  Individual Therapy and Psychoeducation Interventions utilized were other Medication Education Purpose: increase insight  Name: Joshua Thomas Date of Birth: 05/26/1975  MR: [id number]    Level of Participation: active Quality of Participation: refused clonidine Interactions with others: Compliant Mood/Affect: appropriate Triggers (if applicable): "I don't want it" Cognition: insightful Progress: Needs Reinforcement Response: Refused medication Plan: Encouraged medication compliance  Patients Problems:  Patient Active Problem List   Diagnosis Date Noted   Polysubstance abuse (HCC) 09/03/2022   Substance induced mood disorder (HCC) 07/29/2022   Right inguinal hernia 05/24/2022   Abdominal adhesions 05/24/2022   Epidural abscess 10/08/2021   Diabetes mellitus (HCC) 10/08/2021   Bilateral low back pain with left-sided sciatica    Acute osteomyelitis (HCC) 07/04/2021   Opioid use disorder, severe, dependence (HCC) 12/15/2020   Hepatitis C 12/15/2020   MDD (major depressive disorder), recurrent severe, 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

## 2022-09-05 NOTE — Group Note (Signed)
Group Topic: Positive Affirmations  Group Date: 09/05/2022 Start Time: 1110 End Time: 1145 Facilitators: Cassandria Anger  Department: [facility]  Number of Participants: 7  Group Focus: affirmation Treatment Modality:  Psychoeducation Interventions utilized were patient education Purpose: increase insight  Name: Joshua Thomas Date of Birth: July 20, 1975  MR: [id number]    Level of Participation: moderate Quality of Participation: attentive, cooperative, engaged, motivated, and supportive Interactions with others: gave feedback Mood/Affect: appropriate and positive Triggers (if applicable): N/A Cognition: coherent/clear, goal directed, and insightful Progress: Moderate Response: N/A Plan: patient will be encouraged to continue to attend group  Patients Problems:  Patient Active Problem List   Diagnosis Date Noted   Polysubstance abuse (HCC) 09/03/2022   Substance induced mood disorder (HCC) 07/29/2022   Right inguinal hernia 05/24/2022   Abdominal adhesions 05/24/2022   Epidural abscess 10/08/2021   Diabetes mellitus (HCC) 10/08/2021   Bilateral low back pain with left-sided sciatica    Acute osteomyelitis (HCC) 07/04/2021   Opioid use disorder, severe, dependence (HCC) 12/15/2020   Hepatitis C 12/15/2020   MDD (major depressive disorder), recurrent severe, 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

## 2022-09-05 NOTE — ED Notes (Signed)
Patient was provided lunch.

## 2022-09-05 NOTE — ED Notes (Signed)
Pt sleeping in no acute distress. RR even and unlabored. Environment secured. Will continue to monitor for safety. 

## 2022-09-05 NOTE — ED Notes (Signed)
Patient A&Ox4. Denies intent to harm self/others when asked. Denies A/VH. Patient denies any physical complaints when asked. No acute distress noted. Medication compliant. Routine safety checks conducted according to facility protocol. Encouraged patient to notify staff if thoughts of harm toward self or others arise. Patient verbalize understanding and agreement. Will continue to monitor for safety.

## 2022-09-05 NOTE — ED Provider Notes (Signed)
Most recent blood pressure reviewed, 105/54.  Will discontinue clonidine taper at this time, because the patient is already on Suboxone and the fact that the medication is likely causing hypotension.  Carlyn Reichert, MD PGY-3

## 2022-09-05 NOTE — Group Note (Signed)
Group Topic: Fears and Unhealthy Coping Skills  Group Date: 09/05/2022 Start Time: 2030 End Time: 2100 Facilitators: Vassie Loll, RN  Department: [facility]  Number of Participants: 3  Group Focus: coping skills Treatment Modality:  Psychoeducation Interventions utilized were support Purpose: enhance coping skills  Name: Joshua Thomas Date of Birth: 1975/10/18  MR: [id number]    Level of Participation: active Quality of Participation: attentive Interactions with others: gave feedback Mood/Affect: appropriate Triggers (if applicable): n/a Cognition: coherent/clear Progress: Moderate Response: n/a Plan: follow-up needed  Patients Problems:  Patient Active Problem List   Diagnosis Date Noted   Polysubstance abuse (HCC) 09/03/2022   Substance induced mood disorder (HCC) 07/29/2022   Right inguinal hernia 05/24/2022   Abdominal adhesions 05/24/2022   Epidural abscess 10/08/2021   Diabetes mellitus (HCC) 10/08/2021   Bilateral low back pain with left-sided sciatica    Acute osteomyelitis (HCC) 07/04/2021   Opioid use disorder, severe, dependence (HCC) 12/15/2020   Hepatitis C 12/15/2020   MDD (major depressive disorder), recurrent severe, 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

## 2022-09-05 NOTE — ED Notes (Signed)
 Patient was provided breakfast

## 2022-09-05 NOTE — ED Notes (Signed)
Patient is the bedroom sleeping. NAD. Respirations are even and unlabored. Will continue to monitor for safety.

## 2022-09-05 NOTE — ED Notes (Signed)
Patient is in the bedroom sleeping currently. NAD, respirations even and unlabored. Will continue to monitor for safety.

## 2022-09-05 NOTE — ED Provider Notes (Addendum)
Behavioral Health Progress Note  Date and Time: 09/05/2022 10:49 AM Name: Joshua Thomas MRN:  [id number]  Subjective:   The patient is a 47 year old male with a history of substance-induced mood disorder, as well as opioid use disorder and alcohol use disorder.  He presented to the [facility] behavioral health urgent care requesting help with opioid withdrawal.  It is also notable that the patient reports drinking 1 pint of liquor per day, his last drink being several days previously.  He has requested residential rehab placement and desires to go to [facility] treatment center.  The patient reports significant improvement in his withdrawal symptoms over the past 24 hours.  He states that he was able to eat some food and that his only remaining symptom is myalgias.  He is clearly thinking ahead to the future and is considering which residential rehabs he will be able to go to.  The patient denies auditory/visual hallucinations.  The patient reports good mood, appetite, and sleep. They deny suicidal and homicidal thoughts. The patient denies side effects from their medications.  Addendum: On interview with the attending psychiatrist, the patient makes statements about suicidal thoughts with no intention or plan.  He states that his religion is a strong protective factor, and he states that he has no intention of committing suicide because of his beliefs.  As described above, he has demonstrated a future oriented thought process.   Diagnosis:  Final diagnoses:  Opioid use disorder  Substance induced mood disorder (HCC)  Alcohol use disorder    Total Time spent with patient: 20 minutes  Past Psychiatric History: as above Past Medical History: as above Family History: none Family Psychiatric  History: none Social History: as above and per H and P  Additional Social History:  See H and P                  Sleep: Fair  Appetite:  Fair   Current Medications:  Current  Facility-Administered Medications  Medication Dose Route Frequency Provider Last Rate Last Admin   alum & mag hydroxide-simeth (MAALOX/MYLANTA) 200-200-20 MG/5ML suspension 30 mL  30 mL Oral Q4H PRN Bobbitt, Shalon E, NP       buprenorphine-naloxone (SUBOXONE) 8-2 mg per SL tablet 1 tablet  1 tablet Sublingual BID Bobbitt, Shalon E, NP   1 tablet at 09/05/22 0931   calcium carbonate (TUMS - dosed in mg elemental calcium) chewable tablet 200 mg of elemental calcium  1 tablet Oral Daily PRN Bobbitt, Shalon E, NP   200 mg of elemental calcium at 09/04/22 0050   cloNIDine (CATAPRES) tablet 0.1 mg  0.1 mg Oral QID Bobbitt, Shalon E, NP   0.1 mg at 09/05/22 0931   Followed by   Melene Muller ON 09/06/2022] cloNIDine (CATAPRES) tablet 0.1 mg  0.1 mg Oral BH-qamhs Bobbitt, Shalon E, NP       Followed by   Melene Muller ON 09/09/2022] cloNIDine (CATAPRES) tablet 0.1 mg  0.1 mg Oral QAC breakfast Bobbitt, Shalon E, NP       dicyclomine (BENTYL) tablet 20 mg  20 mg Oral Q6H PRN Bobbitt, Shalon E, NP       divalproex (DEPAKOTE) DR tablet 250 mg  250 mg Oral QHS Bobbitt, Shalon E, NP   250 mg at 09/04/22 2133   hydrOXYzine (ATARAX) tablet 25 mg  25 mg Oral Q6H PRN Bobbitt, Shalon E, NP   25 mg at 09/04/22 1001   ibuprofen (ADVIL) tablet 800 mg  800 mg Oral  Q6H PRN Bobbitt, Shalon E, NP       loperamide (IMODIUM) capsule 2-4 mg  2-4 mg Oral PRN Bobbitt, Shalon E, NP   2 mg at 09/04/22 0021   magnesium hydroxide (MILK OF MAGNESIA) suspension 30 mL  30 mL Oral Daily PRN Bobbitt, Shalon E, NP       metFORMIN (GLUCOPHAGE) tablet 250 mg  250 mg Oral BID WC Bobbitt, Shalon E, NP   250 mg at 09/05/22 0846   methocarbamol (ROBAXIN) tablet 500 mg  500 mg Oral Q8H PRN Bobbitt, Shalon E, NP   500 mg at 09/04/22 2136   naproxen (NAPROSYN) tablet 500 mg  500 mg Oral BID PRN Bobbitt, Shalon E, NP       nicotine (NICODERM CQ - dosed in mg/24 hours) patch 21 mg  21 mg Transdermal Daily Carlyn Reichert, MD   21 mg at 09/05/22 0935    ondansetron (ZOFRAN-ODT) disintegrating tablet 4 mg  4 mg Oral Q6H PRN Bobbitt, Shalon E, NP       prazosin (MINIPRESS) capsule 1 mg  1 mg Oral QHS Bobbitt, Shalon E, NP   1 mg at 09/04/22 2132   QUEtiapine (SEROQUEL) tablet 100 mg  100 mg Oral QHS Bobbitt, Shalon E, NP   100 mg at 09/04/22 2132   sertraline (ZOLOFT) tablet 100 mg  100 mg Oral Daily Bobbitt, Shalon E, NP   100 mg at 09/05/22 0934   traZODone (DESYREL) tablet 50 mg  50 mg Oral QHS PRN Bobbitt, Shalon E, NP       Current Outpatient Medications  Medication Sig Dispense Refill   Aspirin-Acetaminophen-Caffeine (GOODY HEADACHE PO) Take 1 packet by mouth daily as needed (for pain).     Buprenorphine HCl-Naloxone HCl (SUBOXONE) 8-2 MG FILM Place 1 Film under the tongue 2 (two) times daily.     Calcium Carbonate Antacid (TUMS PO) Take 2-3 tablets by mouth daily as needed (gerd).     cloNIDine (CATAPRES) 0.1 MG tablet Take 0.1 mg by mouth 2 (two) times daily.     cyclobenzaprine (FLEXERIL) 10 MG tablet Take 10 mg by mouth 2 (two) times daily as needed for muscle spasms.     divalproex (DEPAKOTE) 250 MG DR tablet Take 250 mg by mouth at bedtime.     hydrOXYzine (VISTARIL) 50 MG capsule Take 50 mg by mouth 2 (two) times daily as needed for anxiety.     ibuprofen (ADVIL) 200 MG tablet Take 800 mg by mouth every 6 (six) hours as needed for mild pain or headache.     Melatonin 10 MG TABS Take 10 mg by mouth at bedtime.     naloxone (NARCAN) nasal spray 4 mg/0.1 mL Take in case of overdose 1 each 0   prazosin (MINIPRESS) 1 MG capsule Take 1 mg by mouth at bedtime.     QUEtiapine (SEROQUEL) 100 MG tablet Take 100 mg by mouth at bedtime.     rOPINIRole (REQUIP) 0.5 MG tablet Take 0.5 mg by mouth at bedtime as needed (for restless legs).     sertraline (ZOLOFT) 50 MG tablet Take 100 mg by mouth daily.      Labs  Lab Results:  Admission on 09/03/2022  Component Date Value Ref Range Status   Valproic Acid Lvl 09/04/2022 <10 (L)  50.0 - 100.0  ug/mL Final   Comment: RESULT CONFIRMED BY MANUAL DILUTION Performed at [facility], [address], [place], [place] [address]    Hgb A1c MFr Bld  09/04/2022 7.3 (H)  4.8 - 5.6 % Final   Comment: (NOTE) Pre diabetes:          5.7%-6.4%  Diabetes:              >6.4%  Glycemic control for   <7.0% adults with diabetes    Mean Plasma Glucose 09/04/2022 162.81  mg/dL Final   Performed at [facility], [address], [place], [place] [address]   Potassium 09/04/2022 4.0  3.5 - 5.1 mmol/L Final   Performed at [facility], [address], [place], [place] [address]  Admission on 09/03/2022, Discharged on 09/03/2022  Component Date Value Ref Range Status   WBC 09/03/2022 5.5  4.0 - 10.5 K/uL Final   RBC 09/03/2022 4.81  4.22 - 5.81 MIL/uL Final   Hemoglobin 09/03/2022 14.4  13.0 - 17.0 g/dL Final   HCT 13/24/4010 44.3  39.0 - 52.0 % Final   MCV 09/03/2022 92.1  80.0 - 100.0 fL Final   MCH 09/03/2022 29.9  26.0 - 34.0 pg Final   MCHC 09/03/2022 32.5  30.0 - 36.0 g/dL Final   RDW 27/25/3664 14.4  11.5 - 15.5 % Final   Platelets 09/03/2022 176  150 - 400 K/uL Final   nRBC 09/03/2022 0.0  0.0 - 0.2 % Final   Neutrophils Relative % 09/03/2022 63  % Final   Neutro Abs 09/03/2022 3.5  1.7 - 7.7 K/uL Final   Lymphocytes Relative 09/03/2022 30  % Final   Lymphs Abs 09/03/2022 1.7  0.7 - 4.0 K/uL Final   Monocytes Relative 09/03/2022 5  % Final   Monocytes Absolute 09/03/2022 0.3  0.1 - 1.0 K/uL Final   Eosinophils Relative 09/03/2022 2  % Final   Eosinophils Absolute 09/03/2022 0.1  0.0 - 0.5 K/uL Final   Basophils Relative 09/03/2022 0  % Final   Basophils Absolute 09/03/2022 0.0  0.0 - 0.1 K/uL Final   Immature Granulocytes 09/03/2022 0  % Final   Abs Immature Granulocytes 09/03/2022 0.02  0.00 - 0.07 K/uL Final   Performed at [facility], [address], [place], [place] [address]   Alcohol, Ethyl (B) 09/03/2022 <10  <10 mg/dL Final   Comment:  (NOTE) Lowest detectable limit for serum alcohol is 10 mg/dL.  For medical purposes only. Performed at [facility], [address] [place], [place] [address]    Sodium 09/03/2022 136  135 - 145 mmol/L Final   Potassium 09/03/2022 3.4 (L)  3.5 - 5.1 mmol/L Final   Chloride 09/03/2022 103  98 - 111 mmol/L Final   CO2 09/03/2022 24  22 - 32 mmol/L Final   Glucose, Bld 09/03/2022 273 (H)  70 - 99 mg/dL Final   Glucose reference range applies only to samples taken after fasting for at least 8 hours.   BUN 09/03/2022 14  6 - 20 mg/dL Final   Creatinine, Ser 09/03/2022 0.83  0.61 - 1.24 mg/dL Final   Calcium 63/87/5643 9.4  8.9 - 10.3 mg/dL Final   Total Protein 32/95/1884 7.5  6.5 - 8.1 g/dL Final   Albumin 16/60/6301 3.9  3.5 - 5.0 g/dL Final   AST 60/10/9321 32  15 - 41 U/L Final   ALT 09/03/2022 53 (H)  0 - 44 U/L Final   Alkaline Phosphatase 09/03/2022 59  38 - 126 U/L Final   Total Bilirubin 09/03/2022 0.3  0.3 - 1.2 mg/dL Final   GFR, Estimated 09/03/2022 >60  >  60 mL/min Final   Comment: (NOTE) Calculated using the CKD-EPI Creatinine Equation (2021)    Anion gap 09/03/2022 9  5 - 15 Final   Performed at [facility], [address] [place], [place] [address]   Acetaminophen (Tylenol), Serum 09/03/2022 <10 (L)  10 - 30 ug/mL Final   Comment: (NOTE) Therapeutic concentrations vary significantly. A range of 10-30 ug/mL  may be an effective concentration for many patients. However, some  are best treated at concentrations outside of this range. Acetaminophen concentrations >150 ug/mL at 4 hours after ingestion  and >50 ug/mL at 12 hours after ingestion are often associated with  toxic reactions.  Performed at [facility], [address] [place], [place] [address]    Salicylate Lvl 09/03/2022 <7.0 (L)  7.0 - 30.0 mg/dL Final   Performed at [facility], [address] [place], [place] [address]    Opiates 09/03/2022 NONE DETECTED  NONE DETECTED Final   Cocaine 09/03/2022 POSITIVE (A)  NONE DETECTED Final   Benzodiazepines 09/03/2022 POSITIVE (A)  NONE DETECTED Final   Amphetamines 09/03/2022 POSITIVE (A)  NONE DETECTED Final   Tetrahydrocannabinol 09/03/2022 POSITIVE (A)  NONE DETECTED Final   Barbiturates 09/03/2022 NONE DETECTED  NONE DETECTED Final   Comment: (NOTE) DRUG SCREEN FOR MEDICAL PURPOSES ONLY.  IF CONFIRMATION IS NEEDED FOR ANY PURPOSE, NOTIFY LAB WITHIN 5 DAYS.  LOWEST DETECTABLE LIMITS FOR URINE DRUG SCREEN Drug Class                     Cutoff (ng/mL) Amphetamine and metabolites    1000 Barbiturate and metabolites    200 Benzodiazepine                 200 Opiates and metabolites        300 Cocaine and metabolites        300 THC                            50 Performed at [facility], [address] [place], [place] [address]   Admission on 07/29/2022, Discharged on 07/29/2022  Component Date Value Ref Range Status   Sodium 07/29/2022 135  135 - 145 mmol/L Final   Potassium 07/29/2022 3.3 (L)  3.5 - 5.1 mmol/L Final   Chloride 07/29/2022 103  98 - 111 mmol/L Final   CO2 07/29/2022 23  22 - 32 mmol/L Final   Glucose, Bld 07/29/2022 131 (H)  70 - 99 mg/dL Final   Glucose reference range applies only to samples taken after fasting for at least 8 hours.   BUN 07/29/2022 8  6 - 20 mg/dL Final   Creatinine, Ser 07/29/2022 0.82  0.61 - 1.24 mg/dL Final   Calcium 59/56/3875 8.5 (L)  8.9 - 10.3 mg/dL Final   Total Protein 64/33/2951 7.3  6.5 - 8.1 g/dL Final   Albumin 88/41/6606 3.6  3.5 - 5.0 g/dL Final   AST 30/16/0109 39  15 - 41 U/L Final   ALT 07/29/2022 44  0 - 44 U/L Final   Alkaline Phosphatase 07/29/2022 46  38 - 126 U/L Final   Total Bilirubin 07/29/2022 0.4  0.3 - 1.2 mg/dL Final   GFR, Estimated 07/29/2022 >60  >60 mL/min Final   Comment: (NOTE) Calculated using the CKD-EPI Creatinine Equation (2021)    Anion gap 07/29/2022 9   5 - 15 Final   Performed  at [facility], [address], [place], [place] [address]   Alcohol, Ethyl (B) 07/29/2022 <10  <10 mg/dL Final   Comment: (NOTE) Lowest detectable limit for serum alcohol is 10 mg/dL.  For medical purposes only. Performed at [facility], [address] [place], [place] [address]    Salicylate Lvl 07/29/2022 <7.0 (L)  7.0 - 30.0 mg/dL Final   Performed at [facility], [address] [place], [place] [address]   Acetaminophen (Tylenol), Serum 07/29/2022 <10 (L)  10 - 30 ug/mL Final   Comment: (NOTE) Therapeutic concentrations vary significantly. A range of 10-30 ug/mL  may be an effective concentration for many patients. However, some  are best treated at concentrations outside of this range. Acetaminophen concentrations >150 ug/mL at 4 hours after ingestion  and >50 ug/mL at 12 hours after ingestion are often associated with  toxic reactions.  Performed at [facility], [address], [place], [place] [address]    WBC 07/29/2022 5.7  4.0 - 10.5 K/uL Final   RBC 07/29/2022 4.40  4.22 - 5.81 MIL/uL Final   Hemoglobin 07/29/2022 13.3  13.0 - 17.0 g/dL Final   HCT 10/93/2355 39.5  39.0 - 52.0 % Final   MCV 07/29/2022 89.8  80.0 - 100.0 fL Final   MCH 07/29/2022 30.2  26.0 - 34.0 pg Final   MCHC 07/29/2022 33.7  30.0 - 36.0 g/dL Final   RDW 73/22/0254 14.0  11.5 - 15.5 % Final   Platelets 07/29/2022 167  150 - 400 K/uL Final   nRBC 07/29/2022 0.0  0.0 - 0.2 % Final   Performed at [facility], [address] [place], [place] [address]   Opiates 07/29/2022 NONE DETECTED  NONE DETECTED Final   Cocaine 07/29/2022 POSITIVE (A)  NONE DETECTED Final   Benzodiazepines 07/29/2022 POSITIVE (A)  NONE DETECTED Final   Amphetamines 07/29/2022 NONE DETECTED  NONE DETECTED Final   Tetrahydrocannabinol 07/29/2022 NONE DETECTED  NONE DETECTED Final   Barbiturates  07/29/2022 NONE DETECTED  NONE DETECTED Final   Comment: (NOTE) DRUG SCREEN FOR MEDICAL PURPOSES ONLY.  IF CONFIRMATION IS NEEDED FOR ANY PURPOSE, NOTIFY LAB WITHIN 5 DAYS.  LOWEST DETECTABLE LIMITS FOR URINE DRUG SCREEN Drug Class                     Cutoff (ng/mL) Amphetamine and metabolites    1000 Barbiturate and metabolites    200 Benzodiazepine                 200 Opiates and metabolites        300 Cocaine and metabolites        300 THC                            50 Performed at [facility], [address], [place], [place] [address]   Admission on 05/28/2022, Discharged on 05/29/2022  Component Date Value Ref Range Status   Sodium 05/28/2022 137  135 - 145 mmol/L Final   Potassium 05/28/2022 3.8  3.5 - 5.1 mmol/L Final   Chloride 05/28/2022 103  98 - 111 mmol/L Final   CO2 05/28/2022 24  22 - 32 mmol/L Final   Glucose, Bld 05/28/2022 146 (H)  70 - 99 mg/dL Final   Glucose reference range applies only to samples taken after fasting for at least 8 hours.   BUN  05/28/2022 14  6 - 20 mg/dL Final   Creatinine, Ser 05/28/2022 0.98  0.61 - 1.24 mg/dL Final   Calcium 96/04/5407 8.9  8.9 - 10.3 mg/dL Final   Total Protein 81/19/1478 7.6  6.5 - 8.1 g/dL Final   Albumin 29/56/2130 3.7  3.5 - 5.0 g/dL Final   AST 86/57/8469 20  15 - 41 U/L Final   ALT 05/28/2022 23  0 - 44 U/L Final   Alkaline Phosphatase 05/28/2022 37 (L)  38 - 126 U/L Final   Total Bilirubin 05/28/2022 0.4  0.3 - 1.2 mg/dL Final   GFR, Estimated 05/28/2022 >60  >60 mL/min Final   Comment: (NOTE) Calculated using the CKD-EPI Creatinine Equation (2021)    Anion gap 05/28/2022 10  5 - 15 Final   Performed at [facility], [address] [place], [place] [address]   Lipase 05/28/2022 22  11 - 51 U/L Final   Performed at [facility], [address], [place], [place] [address]   WBC 05/28/2022 5.5  4.0 - 10.5 K/uL Final   RBC 05/28/2022 4.73  4.22 - 5.81 MIL/uL Final   Hemoglobin  05/28/2022 14.1  13.0 - 17.0 g/dL Final   HCT 44/01/270 43.4  39.0 - 52.0 % Final   MCV 05/28/2022 91.8  80.0 - 100.0 fL Final   MCH 05/28/2022 29.8  26.0 - 34.0 pg Final   MCHC 05/28/2022 32.5  30.0 - 36.0 g/dL Final   RDW 53/66/4403 14.0  11.5 - 15.5 % Final   Platelets 05/28/2022 193  150 - 400 K/uL Final   nRBC 05/28/2022 0.0  0.0 - 0.2 % Final   Neutrophils Relative % 05/28/2022 48  % Final   Neutro Abs 05/28/2022 2.7  1.7 - 7.7 K/uL Final   Lymphocytes Relative 05/28/2022 41  % Final   Lymphs Abs 05/28/2022 2.2  0.7 - 4.0 K/uL Final   Monocytes Relative 05/28/2022 8  % Final   Monocytes Absolute 05/28/2022 0.4  0.1 - 1.0 K/uL Final   Eosinophils Relative 05/28/2022 3  % Final   Eosinophils Absolute 05/28/2022 0.2  0.0 - 0.5 K/uL Final   Basophils Relative 05/28/2022 0  % Final   Basophils Absolute 05/28/2022 0.0  0.0 - 0.1 K/uL Final   Immature Granulocytes 05/28/2022 0  % Final   Abs Immature Granulocytes 05/28/2022 0.02  0.00 - 0.07 K/uL Final   Performed at [facility], [address] [place], [place] [address]   Color, Urine 05/29/2022 STRAW (A)  YELLOW Final   APPearance 05/29/2022 CLEAR  CLEAR Final   Specific Gravity, Urine 05/29/2022 1.029  1.005 - 1.030 Final   pH 05/29/2022 6.0  5.0 - 8.0 Final   Glucose, UA 05/29/2022 NEGATIVE  NEGATIVE mg/dL Final   Hgb urine dipstick 05/29/2022 NEGATIVE  NEGATIVE Final   Bilirubin Urine 05/29/2022 NEGATIVE  NEGATIVE Final   Ketones, ur 05/29/2022 NEGATIVE  NEGATIVE mg/dL Final   Protein, ur 95/63/8756 NEGATIVE  NEGATIVE mg/dL Final   Nitrite 43/32/9518 NEGATIVE  NEGATIVE Final   Leukocytes,Ua 05/29/2022 NEGATIVE  NEGATIVE Final   RBC / HPF 05/29/2022 0-5  0 - 5 RBC/hpf Final   WBC, UA 05/29/2022 0-5  0 - 5 WBC/hpf Final   Bacteria, UA 05/29/2022 NONE SEEN  NONE SEEN Final   Squamous Epithelial / HPF 05/29/2022 0-5  0 - 5 /HPF Final   Performed at [facility], [address], [place], [place] [address]  Admission  on 05/24/2022, Discharged on 05/24/2022  Component Date Value Ref Range Status   Glucose-Capillary 05/24/2022 151 (H)  70 - 99 mg/dL Final   Glucose reference range applies only to samples taken after fasting for at least 8 hours.   Glucose-Capillary 05/24/2022 175 (H)  70 - 99 mg/dL Final   Glucose reference range applies only to samples taken after fasting for at least 8 hours.  Hospital Outpatient Visit on 05/21/2022  Component Date Value Ref Range Status   WBC 05/21/2022 3.7 (L)  4.0 - 10.5 K/uL Final   RBC 05/21/2022 4.43  4.22 - 5.81 MIL/uL Final   Hemoglobin 05/21/2022 13.4  13.0 - 17.0 g/dL Final   HCT 62/95/2841 40.5  39.0 - 52.0 % Final   MCV 05/21/2022 91.4  80.0 - 100.0 fL Final   MCH 05/21/2022 30.2  26.0 - 34.0 pg Final   MCHC 05/21/2022 33.1  30.0 - 36.0 g/dL Final   RDW 32/44/0102 14.6  11.5 - 15.5 % Final   Platelets 05/21/2022 130 (L)  150 - 400 K/uL Final   nRBC 05/21/2022 0.0  0.0 - 0.2 % Final   Neutrophils Relative % 05/21/2022 44  % Final   Neutro Abs 05/21/2022 1.6 (L)  1.7 - 7.7 K/uL Final   Lymphocytes Relative 05/21/2022 45  % Final   Lymphs Abs 05/21/2022 1.6  0.7 - 4.0 K/uL Final   Monocytes Relative 05/21/2022 6  % Final   Monocytes Absolute 05/21/2022 0.2  0.1 - 1.0 K/uL Final   Eosinophils Relative 05/21/2022 5  % Final   Eosinophils Absolute 05/21/2022 0.2  0.0 - 0.5 K/uL Final   Basophils Relative 05/21/2022 0  % Final   Basophils Absolute 05/21/2022 0.0  0.0 - 0.1 K/uL Final   Immature Granulocytes 05/21/2022 0  % Final   Abs Immature Granulocytes 05/21/2022 0.01  0.00 - 0.07 K/uL Final   Performed at [facility], [address] [place], [place] [address]   Hgb A1c MFr Bld 05/21/2022 6.7 (H)  4.8 - 5.6 % Final   Comment: (NOTE) Pre diabetes:          5.7%-6.4%  Diabetes:              >6.4%  Glycemic control for   <7.0% adults with diabetes    Mean Plasma Glucose 05/21/2022 145.59  mg/dL Final   Performed at [facility], [address], [place], [place] [address]   Sodium 05/21/2022 133 (L)  135 - 145 mmol/L Final   Potassium 05/21/2022 3.9  3.5 - 5.1 mmol/L Final   Chloride 05/21/2022 102  98 - 111 mmol/L Final   CO2 05/21/2022 22  22 - 32 mmol/L Final   Glucose, Bld 05/21/2022 242 (H)  70 - 99 mg/dL Final   Glucose reference range applies only to samples taken after fasting for at least 8 hours.   BUN 05/21/2022 22 (H)  6 - 20 mg/dL Final   Creatinine, Ser 05/21/2022 0.93  0.61 - 1.24 mg/dL Final   Calcium 47/42/5956 9.1  8.9 - 10.3 mg/dL Final   Total Protein 38/75/6433 7.3  6.5 - 8.1 g/dL Final   Albumin 29/51/8841 3.6  3.5 - 5.0 g/dL Final   AST 66/06/3014 20  15 - 41 U/L Final   ALT 05/21/2022 36  0 - 44 U/L Final   Alkaline Phosphatase 05/21/2022 41  38 - 126 U/L Final   Total Bilirubin 05/21/2022 0.8  0.3 - 1.2  mg/dL Final   GFR, Estimated 05/21/2022 >60  >60 mL/min Final   Comment: (NOTE) Calculated using the CKD-EPI Creatinine Equation (2021)    Anion gap 05/21/2022 9  5 - 15 Final   Performed at [facility], [address], [place], [place] [address]   Prothrombin Time 05/21/2022 13.7  11.4 - 15.2 seconds Final   INR 05/21/2022 1.0  0.8 - 1.2 Final   Comment: (NOTE) INR goal varies based on device and disease states. Performed at [facility], [address] [place], [place] [address]    Opiates 05/21/2022 NONE DETECTED  NONE DETECTED Final   Cocaine 05/21/2022 NONE DETECTED  NONE DETECTED Final   Benzodiazepines 05/21/2022 NONE DETECTED  NONE DETECTED Final   Amphetamines 05/21/2022 NONE DETECTED  NONE DETECTED Final   Tetrahydrocannabinol 05/21/2022 NONE DETECTED  NONE DETECTED Final   Barbiturates 05/21/2022 NONE DETECTED  NONE DETECTED Final   Comment: (NOTE) DRUG SCREEN FOR MEDICAL PURPOSES ONLY.  IF CONFIRMATION IS NEEDED FOR ANY PURPOSE, NOTIFY LAB WITHIN 5 DAYS.  LOWEST DETECTABLE LIMITS FOR URINE DRUG SCREEN Drug Class                     Cutoff (ng/mL) Amphetamine and metabolites     1000 Barbiturate and metabolites    200 Benzodiazepine                 200 Opiates and metabolites        300 Cocaine and metabolites        300 THC                            50 Performed at [facility], [address], [place], [place] [address]     Blood Alcohol level:  Lab Results  Component Value Date   [facility] <10 09/03/2022   ETH <10 07/29/2022    Metabolic Disorder Labs: Lab Results  Component Value Date   HGBA1C 7.3 (H) 09/04/2022   MPG 162.81 09/04/2022   MPG 145.59 05/21/2022   Lab Results  Component Value Date   PROLACTIN 31.3 (H) 05/23/2015   Lab Results  Component Value Date   CHOL 129 12/09/2020   TRIG 84 12/09/2020   HDL 29 (L) 12/09/2020   CHOLHDL 4.4 12/09/2020   VLDL 17 12/09/2020   LDLCALC 83 12/09/2020   LDLCALC 49 10/08/2020    Therapeutic Lab Levels: No results found for: "LITHIUM" Lab Results  Component Value Date   VALPROATE <10 (L) 09/04/2022   No results found for: "CBMZ"  Physical Findings   AIMS    Flowsheet Row [facility]) [facility]-Admission (Discharged) [facility] (Discharged) [facility]  AIMS Total Score 0 0 0      AUDIT    Flowsheet Row Ad[facility]) [facility] (Discharged[facility]-Admission (Discharged) [facility]) [facility]  Alcohol Use Disorder Identification Test Final Score (AUDIT) 1 1 6  0      GAD-7    Flowsheet Row Office Visit from 01/31/2018 in [facility] Office Visit from 12/13/2017 in [facility] Office  Visit  from 11/29/2017 in [facility]  Total GAD-7 Score 19 19 6       [id number]    Flowsheet Row ED from 09/03/2022 in [facility] Office Visit from 03/14/2018 in [facility] Office Visit from 02[facility]28/2018 in [facility] Office Visit from 02/14/2018 in [facility] [facility] Office Visit from 01/31/2018 in [facility]  PHQ-2 Total Score 2 2 3 6 4   PHQ-9 Total Score 5 15 18 23 17       Flowsheet Row ED from 09/03/2022 in [facility] Most recent reading at 09/04/2022  2:01 AM ED from 09/03/2022 in [facility] Emergency Department at [facility] Most recent reading at 09/03/2022  6:33 PM ED from 07/29/2022 in [facility] Emergency Department at [facility] Most recent reading at 07/29/2022  8:25 AM  C-SSRS RISK CATEGORY No Risk High Risk High Risk        Musculoskeletal  Strength & Muscle Tone: within normal limits Gait & Station: normal Patient leans: N/A  Psychiatric Specialty Exam  Presentation General Appearance: Appropriate for Environment  Eye Contact:Fair  Speech:Clear and Coherent  Speech Volume:Normal  Handedness:-- (not assessed)   Mood and Affect  Mood:Euthymic  Affect:Congruent   Thought Process  Thought Processes:Coherent; Linear  Descriptions of Associations:Intact  Orientation:Full (Time, Place and Person)  Thought Content:Logical    Hallucinations:Hallucinations: None  Ideas of Reference:None  Suicidal Thoughts:Suicidal Thoughts: No  Homicidal Thoughts:Homicidal Thoughts: No   Sensorium  Memory:Immediate Fair; Recent Fair; Remote Fair  Judgment:Fair  Insight:Fair   Executive Functions  Concentration:Fair  Attention Span:Fair  Recall:Fair  Fund of Knowledge:Fair  Language:Fair   Psychomotor Activity  Psychomotor Activity:Psychomotor Activity:  Normal   Assets  Assets:Communication Skills; Resilience   Sleep  Sleep:Sleep: Fair   Nutritional Assessment (For OBS and FBC admissions only) Has the patient had a weight loss or gain of 10 pounds or more in the last 3 months?: No Has the patient had a decrease in food intake/or appetite?: Yes Does the patient have dental problems?: No Does the patient have eating habits or behaviors that may be indicators of an eating disorder including binging or inducing vomiting?: No Has the patient recently lost weight without trying?: 0 Has the patient been eating poorly because of a decreased appetite?: 0 Malnutrition Screening Tool Score: 0    Physical Exam Constitutional:      Appearance: the patient is not toxic-appearing.  Pulmonary:     Effort: Pulmonary effort is normal.  Neurological:     General: No focal deficit present.     Mental Status: the patient is alert and oriented to person, place, and time.   Review of Systems  Gastrointestinal:  Negative for abdominal pain, constipation, diarrhea, nausea and vomiting.  Neurological:  Negative for headaches.    BP 101/65   Pulse 68   Temp 98.1 F (36.7 C) (Oral)   Resp 18   SpO2 100%   Assessment and Plan:  Opioid use disorder, currently in withdrawal - Continue outpatient Suboxone regimen of 8 mg twice daily - The patient has consistent fills for Suboxone from 1 prescriber. - COWS and clonidine protocol, blood pressure is somewhat low but should improve with decreasing frequency of clonidine and eventually taper off this medication - Residential rehab placement  Substance-induced mood disorder - Continue Depakote 250 mg daily, Depakote level was less than 10, likely secondary to  noncompliance - Continue Seroquel 100 mg nightly - Continue Zoloft 100 mg daily - Discontinued Requip 0.5 mg.  It appears the patient has not filled this medication in several months.   Carlyn Reichert, MD 09/05/2022 10:49 AM

## 2022-09-06 MED ORDER — SENNA 8.6 MG PO TABS: 1.0000 | ORAL_TABLET | Freq: Every evening | ORAL | Status: DC | PRN

## 2022-09-06 MED ORDER — LOPERAMIDE HCL 2 MG PO CAPS: 2.0000 mg | ORAL_CAPSULE | ORAL | Status: AC | PRN

## 2022-09-06 MED ORDER — IBUPROFEN 600 MG PO TABS
600.0000 mg | ORAL_TABLET | Freq: Four times a day (QID) | ORAL | Status: DC | PRN
  Administered 2022-09-07 – 2022-09-15 (×5): 600 mg via ORAL
  Filled 2022-09-06 (×5): qty 1

## 2022-09-06 MED ORDER — HALOPERIDOL 5 MG PO TABS: 5.0000 mg | ORAL_TABLET | Freq: Four times a day (QID) | ORAL | Status: DC | PRN

## 2022-09-06 MED ORDER — BISMUTH SUBSALICYLATE 262 MG PO CHEW: 524.0000 mg | CHEWABLE_TABLET | ORAL | Status: DC | PRN

## 2022-09-06 MED ORDER — ALUM & MAG HYDROXIDE-SIMETH 200-200-20 MG/5ML PO SUSP: 30.0000 mL | ORAL | Status: DC | PRN

## 2022-09-06 MED ORDER — HALOPERIDOL LACTATE 5 MG/ML IJ SOLN: 5.0000 mg | Freq: Four times a day (QID) | INTRAMUSCULAR | Status: DC | PRN

## 2022-09-06 MED ORDER — HYDROXYZINE HCL 25 MG PO TABS
25.0000 mg | ORAL_TABLET | Freq: Three times a day (TID) | ORAL | Status: DC | PRN
  Administered 2022-09-08 – 2022-09-15 (×3): 25 mg via ORAL
  Filled 2022-09-06 (×3): qty 1

## 2022-09-06 MED ORDER — MELATONIN 3 MG PO TABS
3.0000 mg | ORAL_TABLET | Freq: Every evening | ORAL | Status: DC | PRN
  Administered 2022-09-08: 3 mg via ORAL
  Filled 2022-09-06: qty 1

## 2022-09-06 MED ORDER — ONDANSETRON HCL 4 MG PO TABS: 8.0000 mg | ORAL_TABLET | Freq: Three times a day (TID) | ORAL | Status: DC | PRN

## 2022-09-06 MED ORDER — LORAZEPAM 2 MG/ML IJ SOLN: 1.0000 mg | Freq: Four times a day (QID) | INTRAMUSCULAR | Status: DC | PRN

## 2022-09-06 MED ORDER — DICYCLOMINE HCL 20 MG PO TABS: 20.0000 mg | ORAL_TABLET | Freq: Four times a day (QID) | ORAL | Status: AC | PRN

## 2022-09-06 MED ORDER — DIPHENHYDRAMINE HCL 25 MG PO CAPS: 25.0000 mg | ORAL_CAPSULE | Freq: Four times a day (QID) | ORAL | Status: DC | PRN

## 2022-09-06 MED ORDER — LIDOCAINE 5 % EX PTCH
1.0000 | MEDICATED_PATCH | Freq: Every day | CUTANEOUS | Status: DC
  Administered 2022-09-06 – 2022-09-15 (×6): 1 via TRANSDERMAL
  Filled 2022-09-06 (×10): qty 1

## 2022-09-06 MED ORDER — DIPHENHYDRAMINE HCL 50 MG/ML IJ SOLN: 25.0000 mg | Freq: Four times a day (QID) | INTRAMUSCULAR | Status: DC | PRN

## 2022-09-06 MED ORDER — LORAZEPAM 1 MG PO TABS: 1.0000 mg | ORAL_TABLET | Freq: Four times a day (QID) | ORAL | Status: DC | PRN

## 2022-09-06 MED ORDER — POLYETHYLENE GLYCOL 3350 17 G PO PACK
17.0000 g | PACK | Freq: Every day | ORAL | Status: DC | PRN
  Administered 2022-09-06: 17 g via ORAL
  Filled 2022-09-06: qty 1

## 2022-09-06 MED ORDER — NAPROXEN 500 MG PO TABS: 500.0000 mg | ORAL_TABLET | Freq: Two times a day (BID) | ORAL | Status: AC | PRN

## 2022-09-06 MED ORDER — BACLOFEN 10 MG PO TABS
5.0000 mg | ORAL_TABLET | Freq: Three times a day (TID) | ORAL | Status: DC | PRN
  Administered 2022-09-07 – 2022-09-15 (×6): 5 mg via ORAL
  Filled 2022-09-06 (×6): qty 1

## 2022-09-06 NOTE — ED Notes (Signed)
Patient in the bedroom sleeping at the moment. NAD. Respirations are even and unlabored. Will continue to monitor for safety.

## 2022-09-06 NOTE — ED Notes (Signed)
Pt was provided dinner.

## 2022-09-06 NOTE — Care Management (Signed)
[facility] Care Management   Writer referred patient to [facility], [facility] and [facility]

## 2022-09-06 NOTE — ED Provider Notes (Signed)
Behavioral Health Progress Note  Date and Time: 09/06/2022 1:32 PM Name: Joshua Thomas MRN:  [id number]  Subjective: Patient says he is worried about where he is going to go. He denies experiencing any cravings for any substances at this time. He denies experiencing any withdrawal symptoms at this time. He mentions he experienced withdrawal tremors last night.  Patient remains interested in residential rehab, specifically [facility].  He shares he has a court date 9/28 for a DUI. He says he has been to [facility] before as well as an [facility]. He says he resided in [place], [place] and this is also where his health insurance is based out of.  Patient does not endorse any side-effects they attribute to medications. Patient does not endorse any somatic complaints  Diagnosis:  Final diagnoses:  Opioid use disorder  Substance induced mood disorder (HCC)  Alcohol use disorder   Total Time spent with patient: 45 minutes   Past Psychiatric History: Bipolar Disorder, depression, ADHD, opioid use disorder, PTSD  Past Medical History: COPD, chronic back pain, degenerative disc disease, Hep C, hypertension, sciatica Family History: Father-alcohol abuse, cirrhosis, brother committed suicide Social History: 47 y/o male, homeless and unemployed  Appetite: Good  Current Medications: Current Facility-Administered Medications  Medication Dose Route Frequency Provider Last Rate Last Admin   alum & mag hydroxide-simeth (MAALOX/MYLANTA) 200-200-20 MG/5ML suspension 30 mL  30 mL Oral Q4H PRN Augusto Gamble, MD       baclofen (LIORESAL) tablet 5 mg  5 mg Oral Q8H PRN Augusto Gamble, MD       bismuth subsalicylate (PEPTO BISMOL) chewable tablet 524 mg  524 mg Oral Q3H PRN Augusto Gamble, MD       buprenorphine-naloxone (SUBOXONE) 8-2 mg per SL tablet 1 tablet  1 tablet Sublingual BID Bobbitt, Shalon E, NP   1 tablet at 09/06/22 0924   dicyclomine (BENTYL) tablet 20 mg  20 mg Oral Q6H PRN Augusto Gamble, MD       haloperidol (HALDOL) tablet 5 mg  5 mg Oral Q6H PRN Augusto Gamble, MD       And   LORazepam (ATIVAN) tablet 1 mg  1 mg Oral Q6H PRN Augusto Gamble, MD       And   diphenhydrAMINE (BENADRYL) capsule 25 mg  25 mg Oral Q6H PRN Augusto Gamble, MD       haloperidol lactate (HALDOL) injection 5 mg  5 mg Intramuscular Q6H PRN Augusto Gamble, MD       And   LORazepam (ATIVAN) injection 1 mg  1 mg Intravenous Q6H PRN Augusto Gamble, MD       And   diphenhydrAMINE (BENADRYL) injection 25 mg  25 mg Intramuscular Q6H PRN Augusto Gamble, MD       divalproex (DEPAKOTE) DR tablet 250 mg  250 mg Oral QHS Bobbitt, Shalon E, NP   250 mg at 09/05/22 2113   hydrOXYzine (ATARAX) tablet 25 mg  25 mg Oral TID PRN Augusto Gamble, MD       ibuprofen (ADVIL) tablet 600 mg  600 mg Oral Q6H PRN Augusto Gamble, MD       loperamide (IMODIUM) capsule 2-4 mg  2-4 mg Oral PRN Augusto Gamble, MD       melatonin tablet 3 mg  3 mg Oral QHS PRN Augusto Gamble, MD       metFORMIN (GLUCOPHAGE) tablet 250 mg  250 mg Oral BID WC Bobbitt, Shalon E, NP   250 mg at 09/06/22  1610   naproxen (NAPROSYN) tablet 500 mg  500 mg Oral BID PRN Augusto Gamble, MD       nicotine (NICODERM CQ - dosed in mg/24 hours) patch 21 mg  21 mg Transdermal Daily Carlyn Reichert, MD   21 mg at 09/06/22 0924   ondansetron (ZOFRAN) tablet 8 mg  8 mg Oral Q8H PRN Augusto Gamble, MD       polyethylene glycol (MIRALAX / GLYCOLAX) packet 17 g  17 g Oral Daily PRN Augusto Gamble, MD       prazosin (MINIPRESS) capsule 1 mg  1 mg Oral QHS Bobbitt, Shalon E, NP   1 mg at 09/05/22 2113   QUEtiapine (SEROQUEL) tablet 100 mg  100 mg Oral QHS Bobbitt, Shalon E, NP   100 mg at 09/05/22 2113   senna (SENOKOT) tablet 8.6 mg  1 tablet Oral QHS PRN Augusto Gamble, MD       sertraline (ZOLOFT) tablet 100 mg  100 mg Oral Daily Bobbitt, Shalon E, NP   100 mg at 09/06/22 9604   Current Outpatient Medications  Medication Sig Dispense Refill   Aspirin-Acetaminophen-Caffeine (GOODY HEADACHE PO) Take 1  packet by mouth daily as needed (for pain).     Buprenorphine HCl-Naloxone HCl (SUBOXONE) 8-2 MG FILM Place 1 Film under the tongue 2 (two) times daily.     Calcium Carbonate Antacid (TUMS PO) Take 2-3 tablets by mouth daily as needed (gerd).     cloNIDine (CATAPRES) 0.1 MG tablet Take 0.1 mg by mouth 2 (two) times daily.     cyclobenzaprine (FLEXERIL) 10 MG tablet Take 10 mg by mouth 2 (two) times daily as needed for muscle spasms.     divalproex (DEPAKOTE) 250 MG DR tablet Take 250 mg by mouth at bedtime.     hydrOXYzine (VISTARIL) 50 MG capsule Take 50 mg by mouth 2 (two) times daily as needed for anxiety.     ibuprofen (ADVIL) 200 MG tablet Take 800 mg by mouth every 6 (six) hours as needed for mild pain or headache.     Melatonin 10 MG TABS Take 10 mg by mouth at bedtime.     naloxone (NARCAN) nasal spray 4 mg/0.1 mL Take in case of overdose 1 each 0   prazosin (MINIPRESS) 1 MG capsule Take 1 mg by mouth at bedtime.     QUEtiapine (SEROQUEL) 100 MG tablet Take 100 mg by mouth at bedtime.     rOPINIRole (REQUIP) 0.5 MG tablet Take 0.5 mg by mouth at bedtime as needed (for restless legs).     sertraline (ZOLOFT) 50 MG tablet Take 100 mg by mouth daily.      Labs  Lab Results:     Latest Ref Rng & Units 09/03/2022    6:48 PM 07/29/2022    8:45 AM 05/28/2022   11:18 PM  CBC  WBC 4.0 - 10.5 K/uL 5.5  5.7  5.5   Hemoglobin 13.0 - 17.0 g/dL 54.0  98.1  19.1   Hematocrit 39.0 - 52.0 % 44.3  39.5  43.4   Platelets 150 - 400 K/uL 176  167  193       Latest Ref Rng & Units 09/04/2022    2:17 PM 09/03/2022    6:48 PM 07/29/2022    8:45 AM  CMP  Glucose 70 - 99 mg/dL  478  295   BUN 6 - 20 mg/dL  14  8   Creatinine 6.21 - 1.24 mg/dL  3.08  6.57  Sodium 135 - 145 mmol/L  136  135   Potassium 3.5 - 5.1 mmol/L 4.0  3.4  3.3   Chloride 98 - 111 mmol/L  103  103   CO2 22 - 32 mmol/L  24  23   Calcium 8.9 - 10.3 mg/dL  9.4  8.5   Total Protein 6.5 - 8.1 g/dL  7.5  7.3   Total Bilirubin 0.3 -  1.2 mg/dL  0.3  0.4   Alkaline Phos 38 - 126 U/L  59  46   AST 15 - 41 U/L  32  39   ALT 0 - 44 U/L  53  44     Blood Alcohol level:  Lab Results  Component Value Date   ETH <10 09/03/2022   ETH <10 07/29/2022   Metabolic Disorder Labs: Lab Results  Component Value Date   HGBA1C 7.3 (H) 09/04/2022   MPG 162.81 09/04/2022   MPG 145.59 05/21/2022   Lab Results  Component Value Date   PROLACTIN 31.3 (H) 05/23/2015   Lab Results  Component Value Date   CHOL 129 12/09/2020   TRIG 84 12/09/2020   HDL 29 (L) 12/09/2020   CHOLHDL 4.4 12/09/2020   VLDL 17 12/09/2020   LDLCALC 83 12/09/2020   LDLCALC 49 10/08/2020   Therapeutic Lab Levels: No results found for: "LITHIUM" Lab Results  Component Value Date   VALPROATE <10 (L) 09/04/2022   No results found for: "CBMZ" Physical Findings   AIMS    Flowsheet Row [facility]) [facility]Ad[facility] (Discharged) [facility]) [facility]  AIMS Total Score 0 0 0      AUDIT    Flowsheet Row [facility]) [facility]) [facility]-Admission (Discharged) [facility]Dis[facility]) [facility]  Alcohol Use Disorder Identification Test Final Score (AUDIT) 1 1 6  0      GAD-7    Flowsheet Row Office Visit from 01/31/2018 in [facility] Office Visit from 12/13/2017 in [facility] Office Visit from 11/29/2017 in [facility]  Total GAD-7 Score 19 19 6       PHQ2-9    Flowsheet Row ED from 09/03/2022 in [facility] Office Visit from 03/14/2018 in [facility] Office Visit from 02/28/2018 in [facility] Office Visit from 02/14/2018 in [facility] Office Visit from 01/31/2018 in [facility]  PHQ-2 Total Score 0 2 3 6 4   PHQ-9 Total Score 5 15 18 23 17       Flowsheet Row ED from 09/03/2022 in [facility] Most recent reading at 09/04/2022  2:01 AM ED from 09/03/2022 in [facility] Emergency Department at [facility] Most recent reading at 09/03/2022  6:33 PM ED from 07/29/2022 in [facility] Emergency Department at [facility] Most recent reading at 07/29/2022  8:25 AM  C-SSRS RISK CATEGORY No Risk High Risk High Risk       Musculoskeletal  Strength & Muscle Tone: within normal limits Gait & Station: normal Patient leans: N/A  Psychiatric Specialty Exam  Presentation  General Appearance: Casual   Eye Contact:Fair   Speech:Clear and Coherent; Normal Rate   Speech Volume:Normal   Handedness:Right   Mood and Affect  Mood:-- ("worried about where I will go")   Affect:Congruent; Tearful; Appropriate; Restricted   Thought Process  Thought Processes:Coherent; Linear   Descriptions of Associations:Intact   Orientation:Full (Time, Place and Person)   Thought Content:Logical; WDL  Diagnosis of Schizophrenia or Schizoaffective disorder in past: No    Hallucinations:Hallucinations: None   Ideas of Reference:None   Suicidal Thoughts:Suicidal Thoughts: No SI Passive Intent and/or Plan: Without Intent   Homicidal Thoughts:Homicidal Thoughts: No   Sensorium  Memory:Immediate Good   Judgment:Good   Insight:Fair   Executive Functions  Concentration:Fair   Attention Span:Fair   Recall:Fair   Fund of Knowledge:Fair   Language:Fair   Psychomotor Activity  Psychomotor Activity:Psychomotor  Activity: Normal   Assets  Assets:Resilience   Sleep  Sleep:Sleep: Fair   No data recorded  Physical Exam  Physical Exam Vitals and nursing note reviewed.  HENT:     Head: Normocephalic and atraumatic.  Pulmonary:     Effort: Pulmonary effort is normal.  Musculoskeletal:     Cervical back: Normal range of motion.  Neurological:     General: No focal deficit present.     Mental Status: He is alert. Mental status is at baseline.    Review of Systems  Constitutional: Negative.   Respiratory: Negative.    Cardiovascular: Negative.   Gastrointestinal: Negative.   Genitourinary: Negative.   Psychiatric/Behavioral:         Psychiatric subjective data addressed in PSE or HPI / daily subjective report   Blood pressure 103/61, pulse 60, temperature 98.6 F (37 C), temperature source Oral, resp. rate 18, SpO2 99%. There is no height or weight on file to calculate BMI.  Treatment Plan Summary: Daily contact with patient to assess and evaluate symptoms and progress in treatment and Medication management: -- continue home buprenorphine-naloxone 8-2 for opioid medication assisted therapy -- continue home valproic acid 250 mg daily for mood stabilization  -- continue home quetiapine 100 mg daily at bedtime for sleep  -- continue home sertraline 100 mg daily for depressive symptoms  -- continue home prazosin 1 mg daily at bedtime for nightmares -- COWS protocol -- medical regimen: metformin -- Patient in need of nicotine replacement; nicotine polacrilex (gum) and nicotine patch 21 mg / 24 hours ordered. Smoking cessation encouraged  PRNs              -- continue ibuprofen 600 mg every 6 hours as needed for mild to moderate pain, fever, and headaches              -- continue hydroxyzine 25 mg three times a day as needed for anxiety              -- continue bismuth subsalicylate 524 mg oral chewable tablet every 3 hours as needed for diarrhea / loose stools              -- continue  senna 8.6 mg oral at bedtime and polyethylene glycol 17 g oral daily as needed for mild to moderate constipation              -- continue ondansetron 8 mg every 8 hours as needed for nausea or vomiting              -- continue aluminum-magnesium hydroxide + simethicone 30 mL every 4 hours as needed for  heartburn or indigestion              -- continue melatonin 3 mg at bedtime as needed for insomnia  -- -- Opiate withdrawal supportive care: as needed loperamide, naproxen, dicyclomine, and baclofen  -- As needed agitation protocol in-place  Augusto Gamble, MD 09/06/22 1:32 PM

## 2022-09-06 NOTE — ED Notes (Signed)
Patient is sleeping. Respirations equal and unlabored, skin warm and dry. No change in assessment or acuity. Routine safety checks conducted according to facility protocol. Will continue to monitor for safety.   

## 2022-09-06 NOTE — Progress Notes (Signed)
Pt is awake, alert and oriented X4. Pt complained of back pain and constipation. Pt declined intervention when offered. No signs of acute distress noted. Administered scheduled meds per order. Pt denies current SI/HI/AVH, plan or intent. Staff will monitor for pt's safety.

## 2022-09-06 NOTE — ED Notes (Signed)
Pt was provided breakfast.

## 2022-09-06 NOTE — ED Notes (Addendum)
Patient alert and oriented x 4. Patient's eye contact is appropriate, speech is demanding, and demeanor is continuing to be demanding/manipulative. Patient has continued to request his buprenorphine dosing to multiple nurses that had explained to him that they would administer it as soon as possible(incident occurring on the unit with another patient that required they be sent EMS). Patient verbalizes complaint of pain level 7/10 stating that his back pain is unbearable. Patient does not appear to be experiencing extreme pain as he has not displayed any difficulty performing his ADL's/since I have arrived on shift. Patient verbalizes that his anxiety level is 7/10 and it is due to him not knowing what will become of him during his court and where he will be placed for treatment. Patient denies A/V/H. Denies H/I and S/I. Will continue to monitor/support.

## 2022-09-06 NOTE — Progress Notes (Signed)
Pt was visible in the milieu and was appropriate on the unit. Pt continues to complain of back pain. No signs of acute distress noted.  Lidocaine patch ordered and administered. Staff will monitor for pt's safety.

## 2022-09-06 NOTE — Group Note (Signed)
Group Topic: Recovery Basics  Group Date: 09/06/2022 Start Time: 1015 End Time: 1100 Facilitators: Londell Moh, NT  Department: [facility]  Number of Participants: 6  Group Focus: other AA Meeting Treatment Modality:  Psychoeducation Interventions utilized were patient education Purpose: increase insight  Name: Joshua Thomas Date of Birth: 1975/10/19  MR: [id number]    Level of Participation: active Quality of Participation: cooperative and engaged Interactions with others: gave feedback Mood/Affect: appropriate Triggers (if applicable): n/a Cognition: coherent/clear Progress: Gaining insight Response: Pt attended AA and was active with sharing his story. 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

## 2022-09-06 NOTE — ED Notes (Signed)
 Pt was provided lunch

## 2022-09-06 NOTE — Progress Notes (Signed)
 Pt's COWS was 7.

## 2022-09-06 NOTE — Group Note (Signed)
Group Topic: Positive Affirmations  Group Date: 09/06/2022 Start Time: 2000 End Time: 2045 Facilitators: Caroline More, LPN  Department: [facility]  Number of Participants: 6  Group Focus: coping skills, Gratitude in Recovery Worksheet. Treatment Modality:  Psychoeducation Interventions utilized were assignment, group exercise, and story telling Purpose: enhance coping skills, express feelings, improve communication skills, increase insight, regain self-worth, and relapse prevention strategies  Name: Joshua Thomas Date of Birth: 06-30-1975  MR: [id number]    Level of Participation: moderate Quality of Participation: engaged Interactions with others: gave feedback Mood/Affect: appropriate Triggers (if applicable): N/A Cognition: goal directed Progress: Moderate Response: Provided appropriate feedback and insightful responses throughout the exercise/group. 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

## 2022-09-07 LAB — POCT URINE DRUG SCREEN - MANUAL ENTRY (I-SCREEN)
POC Amphetamine UR: NOT DETECTED
POC Buprenorphine (BUP): POSITIVE — AB
POC Cocaine UR: NOT DETECTED
POC Marijuana UR: NOT DETECTED
POC Methadone UR: NOT DETECTED
POC Methamphetamine UR: NOT DETECTED
POC Morphine: NOT DETECTED
POC Oxazepam (BZO): NOT DETECTED
POC Oxycodone UR: NOT DETECTED
POC Secobarbital (BAR): NOT DETECTED

## 2022-09-07 NOTE — ED Notes (Signed)
Pt is in the dayroom watching TV with peers. Pt denies SI/HI/AVH. No acute distress noted. Pt complaining still not being able to use the bathroom.Will continue to monitor for safety for safety and provide support.

## 2022-09-07 NOTE — ED Notes (Signed)
Patient resting with eyes closed. Respirations even and unlabored. No acute distress noted. Environment secured. Will continue to monitor for safety.

## 2022-09-07 NOTE — ED Notes (Signed)
Patient resting in room. Respirations even and unlabored. No acute distress noted. Environment secured. Will continue to monitor for safety.

## 2022-09-07 NOTE — ED Notes (Signed)
Patient A&Ox4. Denies intent to harm self/others when asked. Denies A/VH. Patient denies any physical complaints when asked. No acute distress noted. Support and encouragement provided. Routine safety checks conducted according to facility protocol. Encouraged patient to notify staff if thoughts of harm toward self or others arise. Patient verbalized understanding and agreement. Will continue to monitor for safety.

## 2022-09-07 NOTE — ED Provider Notes (Signed)
Behavioral Health Progress Note  Date and Time: 09/07/2022 11:48 AM Name: Joshua Thomas MRN:  [id number]  Subjective: Patient says he is still in pain and did not sleep at all. Yesterday he thought it might have been from a kidney stone but today he is not so sure. He asks if he can have a muscle relaxant and I directed him to request baclofen which was already ordered as needed for muscle spasms. He denies experiencing any cravings for any substances at this time. He denies experiencing any withdrawal symptoms at this time.  Patient still wants to pursue residential treatment.  Patient does not endorse any side-effects they attribute to medications. Patient endorses the following complaints: pain in left flank  Diagnosis:  Final diagnoses:  Opioid use disorder  Substance induced mood disorder (HCC)  Alcohol use disorder   Total Time spent with patient: 45 minutes   Past Psychiatric History: Bipolar Disorder, depression, ADHD, opioid use disorder, PTSD  Past Medical History: COPD, chronic back pain, degenerative disc disease, Hep C, hypertension, sciatica Family History: Father-alcohol abuse, cirrhosis, brother committed suicide Social History: 47 y/o male, homeless and unemployed  Appetite: Good  Current Medications: Current Facility-Administered Medications  Medication Dose Route Frequency Provider Last Rate Last Admin   alum & mag hydroxide-simeth (MAALOX/MYLANTA) 200-200-20 MG/5ML suspension 30 mL  30 mL Oral Q4H PRN Augusto Gamble, MD       baclofen (LIORESAL) tablet 5 mg  5 mg Oral Q8H PRN Augusto Gamble, MD   5 mg at 09/07/22 3329   bismuth subsalicylate (PEPTO BISMOL) chewable tablet 524 mg  524 mg Oral Q3H PRN Augusto Gamble, MD       buprenorphine-naloxone (SUBOXONE) 8-2 mg per SL tablet 1 tablet  1 tablet Sublingual BID Bobbitt, Shalon E, NP   1 tablet at 09/07/22 5188   dicyclomine (BENTYL) tablet 20 mg  20 mg Oral Q6H PRN Augusto Gamble, MD       haloperidol (HALDOL) tablet 5 mg   5 mg Oral Q6H PRN Augusto Gamble, MD       And   LORazepam (ATIVAN) tablet 1 mg  1 mg Oral Q6H PRN Augusto Gamble, MD       And   diphenhydrAMINE (BENADRYL) capsule 25 mg  25 mg Oral Q6H PRN Augusto Gamble, MD       haloperidol lactate (HALDOL) injection 5 mg  5 mg Intramuscular Q6H PRN Augusto Gamble, MD       And   LORazepam (ATIVAN) injection 1 mg  1 mg Intravenous Q6H PRN Augusto Gamble, MD       And   diphenhydrAMINE (BENADRYL) injection 25 mg  25 mg Intramuscular Q6H PRN Augusto Gamble, MD       divalproex (DEPAKOTE) DR tablet 250 mg  250 mg Oral QHS Bobbitt, Shalon E, NP   250 mg at 09/06/22 2152   hydrOXYzine (ATARAX) tablet 25 mg  25 mg Oral TID PRN Augusto Gamble, MD       ibuprofen (ADVIL) tablet 600 mg  600 mg Oral Q6H PRN Augusto Gamble, MD       lidocaine (LIDODERM) 5 % 1 patch  1 patch Transdermal Daily Augusto Gamble, MD   1 patch at 09/06/22 1739   loperamide (IMODIUM) capsule 2-4 mg  2-4 mg Oral PRN Augusto Gamble, MD       melatonin tablet 3 mg  3 mg Oral QHS PRN Augusto Gamble, MD       metFORMIN (GLUCOPHAGE) tablet 250 mg  250 mg Oral BID WC Bobbitt, Shalon E, NP   250 mg at 09/07/22 0904   naproxen (NAPROSYN) tablet 500 mg  500 mg Oral BID PRN Augusto Gamble, MD       nicotine (NICODERM CQ - dosed in mg/24 hours) patch 21 mg  21 mg Transdermal Daily Carlyn Reichert, MD   21 mg at 09/07/22 0911   ondansetron (ZOFRAN) tablet 8 mg  8 mg Oral Q8H PRN Augusto Gamble, MD       polyethylene glycol (MIRALAX / GLYCOLAX) packet 17 g  17 g Oral Daily PRN Augusto Gamble, MD   17 g at 09/06/22 2154   prazosin (MINIPRESS) capsule 1 mg  1 mg Oral QHS Bobbitt, Shalon E, NP   1 mg at 09/06/22 2152   QUEtiapine (SEROQUEL) tablet 100 mg  100 mg Oral QHS Bobbitt, Shalon E, NP   100 mg at 09/06/22 2152   senna (SENOKOT) tablet 8.6 mg  1 tablet Oral QHS PRN Augusto Gamble, MD       sertraline (ZOLOFT) tablet 100 mg  100 mg Oral Daily Bobbitt, Shalon E, NP   100 mg at 09/07/22 1610   Current Outpatient Medications  Medication Sig  Dispense Refill   Aspirin-Acetaminophen-Caffeine (GOODY HEADACHE PO) Take 1 packet by mouth daily as needed (for pain).     Buprenorphine HCl-Naloxone HCl (SUBOXONE) 8-2 MG FILM Place 1 Film under the tongue 2 (two) times daily.     Calcium Carbonate Antacid (TUMS PO) Take 2-3 tablets by mouth daily as needed (gerd).     cloNIDine (CATAPRES) 0.1 MG tablet Take 0.1 mg by mouth 2 (two) times daily.     cyclobenzaprine (FLEXERIL) 10 MG tablet Take 10 mg by mouth 2 (two) times daily as needed for muscle spasms.     divalproex (DEPAKOTE) 250 MG DR tablet Take 250 mg by mouth at bedtime.     hydrOXYzine (VISTARIL) 50 MG capsule Take 50 mg by mouth 2 (two) times daily as needed for anxiety.     ibuprofen (ADVIL) 200 MG tablet Take 800 mg by mouth every 6 (six) hours as needed for mild pain or headache.     Melatonin 10 MG TABS Take 10 mg by mouth at bedtime.     naloxone (NARCAN) nasal spray 4 mg/0.1 mL Take in case of overdose 1 each 0   prazosin (MINIPRESS) 1 MG capsule Take 1 mg by mouth at bedtime.     QUEtiapine (SEROQUEL) 100 MG tablet Take 100 mg by mouth at bedtime.     rOPINIRole (REQUIP) 0.5 MG tablet Take 0.5 mg by mouth at bedtime as needed (for restless legs).     sertraline (ZOLOFT) 50 MG tablet Take 100 mg by mouth daily.      Labs  Lab Results:     Latest Ref Rng & Units 09/03/2022    6:48 PM 07/29/2022    8:45 AM 05/28/2022   11:18 PM  CBC  WBC 4.0 - 10.5 K/uL 5.5  5.7  5.5   Hemoglobin 13.0 - 17.0 g/dL 96.0  45.4  09.8   Hematocrit 39.0 - 52.0 % 44.3  39.5  43.4   Platelets 150 - 400 K/uL 176  167  193       Latest Ref Rng & Units 09/04/2022    2:17 PM 09/03/2022    6:48 PM 07/29/2022    8:45 AM  CMP  Glucose 70 - 99 mg/dL  119  147   BUN 6 -  20 mg/dL  14  8   Creatinine 4.54 - 1.24 mg/dL  0.98  1.19   Sodium 147 - 145 mmol/L  136  135   Potassium 3.5 - 5.1 mmol/L 4.0  3.4  3.3   Chloride 98 - 111 mmol/L  103  103   CO2 22 - 32 mmol/L  24  23   Calcium 8.9 - 10.3 mg/dL   9.4  8.5   Total Protein 6.5 - 8.1 g/dL  7.5  7.3   Total Bilirubin 0.3 - 1.2 mg/dL  0.3  0.4   Alkaline Phos 38 - 126 U/L  59  46   AST 15 - 41 U/L  32  39   ALT 0 - 44 U/L  53  44     Blood Alcohol level:  Lab Results  Component Value Date   ETH <10 09/03/2022   ETH <10 07/29/2022   Metabolic Disorder Labs: Lab Results  Component Value Date   HGBA1C 7.3 (H) 09/04/2022   MPG 162.81 09/04/2022   MPG 145.59 05/21/2022   Lab Results  Component Value Date   PROLACTIN 31.3 (H) 05/23/2015   Lab Results  Component Value Date   CHOL 129 12/09/2020   TRIG 84 12/09/2020   HDL 29 (L) 12/09/2020   CHOLHDL 4.4 12/09/2020   VLDL 17 12/09/2020   LDLCALC 83 12/09/2020   LDLCALC 49 10/08/2020   Therapeutic Lab Levels: No results found for: "LITHIUM" Lab Results  Component Value Date   VALPROATE <10 (L) 09/04/2022   No results found for: "CBMZ" Physical Findings   AIMS    Flowsheet Row [facility]) [facility]-Ad[facility] (Discharged) [facility]) [facility]  AIMS Total Score 0 0 0      AUDIT    Flowsheet Row [facility]) [facility]Ad[facility] (Discharged) [facility]  Alcohol Use Disorder Identification Test Final Score (AUDIT) 1 1 6  0      GAD-7    Flowsheet Row [facility]/31/2018 in [facility] Office Visit from 12/13/2017 in [facility] Office Visit from 11/29/2017 in [facility]  Total GAD-7 Score  19 19 6       PHQ2-9    Flowsheet Row ED from 09/03/2022 in [facility] Office Visit from 03/14/2018 in [facility] Office Visit from 02/28/2018 in [facility] Office Visit from 02/14/2018 in [facility] Office Visit from 01/31/2018 in [facility]  PHQ-2 Total Score 0 2 3 6 4   PHQ-9 Total Score 5 15 18 23 17       Flowsheet Row ED from 09/03/2022 in [facility] Most recent reading at 09/04/2022  2:01 AM ED from 09/03/2022 in [facility] at [facility] Most recent reading at 09/03/2022  6:33 PM ED from 07/29/2022 in [facility] Emergency Department at [facility] Most recent reading at 07/29/2022  8:25 AM  C-SSRS RISK CATEGORY No Risk High Risk High Risk       Musculoskeletal  Strength & Muscle Tone: within normal limits Gait & Station: normal Patient leans: N/A  Psychiatric Specialty Exam  Presentation  General Appearance: Appropriate for Environment; Casual   Eye Contact:Good   Speech:Clear and Coherent; Normal Rate   Speech Volume:Normal   Handedness:Right   Mood and Affect  Mood:-- ("I'm in a lot of pain and I didn't sleep at all")   Affect:Appropriate; Congruent; Restricted   Thought Process  Thought Processes:Coherent; Linear; Goal Directed   Descriptions of Associations:Intact   Orientation:Full (Time, Place and Person)   Thought Content:Logical; WDL  Diagnosis of Schizophrenia or Schizoaffective disorder in past: No    Hallucinations:Hallucinations: None   Ideas of Reference:None   Suicidal Thoughts:Suicidal Thoughts: No SI Passive Intent and/or Plan: Without Intent; Without Plan   Homicidal Thoughts:Homicidal Thoughts: No   Sensorium  Memory:Immediate Good; Recent Good; Remote Good   Judgment:Fair   Insight:Fair   Executive Functions   Concentration:Good   Attention Span:Good   Recall:Good   Fund of Knowledge:Good   Language:Good   Psychomotor Activity  Psychomotor Activity:Psychomotor Activity: Normal   Assets  Assets:Resilience   Sleep  Sleep:Sleep: Poor   No data recorded  Physical Exam  Physical Exam Vitals and nursing note reviewed.  HENT:     Head: Normocephalic and atraumatic.  Pulmonary:     Effort: Pulmonary effort is normal.  Musculoskeletal:     Cervical back: Normal range of motion.  Neurological:     General: No focal deficit present.     Mental Status: He is alert. Mental status is at baseline.    Review of Systems  Constitutional: Negative.   Respiratory: Negative.    Cardiovascular: Negative.   Gastrointestinal: Negative.   Genitourinary: Negative.   Musculoskeletal:        Pain on L flank area - feels like muscle spasms  Psychiatric/Behavioral:         Psychiatric subjective data addressed in PSE or HPI / daily subjective report   Blood pressure 121/72, pulse 60, temperature 98.6 F (37 C), temperature source Oral, resp. rate 18, SpO2 100%. There is no height or weight on file to calculate BMI.  Treatment Plan Summary: Daily contact with patient to assess and evaluate symptoms and progress in treatment and Medication management: -- continue home buprenorphine-naloxone 8-2 for opioid medication assisted therapy -- continue home valproic acid 250 mg daily for mood stabilization  -- continue home quetiapine 100 mg daily at bedtime for sleep  -- continue home sertraline 100 mg daily for depressive symptoms  -- continue home prazosin 1 mg daily at bedtime for nightmares -- COWS protocol -- medical regimen: metformin -- Patient in need of nicotine replacement; nicotine polacrilex (gum) and nicotine patch 21 mg / 24 hours ordered. Smoking cessation encouraged  PRNs              -- continue ibuprofen 600 mg every 6 hours as needed for mild to moderate pain, fever, and  headaches              -- continue hydroxyzine 25 mg three times a day as needed for anxiety              -- continue bismuth subsalicylate 524 mg oral chewable tablet every 3 hours as needed for diarrhea / loose stools              -- continue senna 8.6 mg oral at bedtime and polyethylene glycol 17 g oral daily as needed for mild to moderate constipation              --  continue ondansetron 8 mg every 8 hours as needed for nausea or vomiting              -- continue aluminum-magnesium hydroxide + simethicone 30 mL every 4 hours as needed for heartburn or indigestion              -- continue melatonin 3 mg at bedtime as needed for insomnia  -- -- Opiate withdrawal supportive care: as needed loperamide, naproxen, dicyclomine, and baclofen  -- As needed agitation protocol in-place  Augusto Gamble, MD 09/07/22 11:48 AM

## 2022-09-07 NOTE — Group Note (Signed)
Group Topic: Wellness  Group Date: 09/07/2022 Start Time: 1700 End Time: 1730 Facilitators: Prentice Docker, RN; Fortunato Curling, LPN  Department: [facility]  Number of Participants: 5  Group Focus: nursing group Treatment Modality:  Patient-Centered Therapy Interventions utilized were patient education Purpose: reinforce self-care  Name: Joshua Thomas Date of Birth: [birth year]  MR: [id number]    Level of Participation: active Quality of Participation: attentive and cooperative Interactions with others: gave feedback Mood/Affect: appropriate Triggers (if applicable): n/a Cognition: coherent/clear Progress: Gaining insight Response: pt drank water with meal Plan: patient will be encouraged to continue water hydration  Patients Problems:  Patient Active Problem List   Diagnosis Date Noted   Polysubstance abuse (HCC) 09/03/2022   Substance induced mood disorder (HCC) 07/29/2022   Right inguinal hernia 05/24/2022   Abdominal adhesions 05/24/2022   Epidural abscess 10/08/2021   Diabetes mellitus (HCC) 10/08/2021   Bilateral low back pain with left-sided sciatica    Acute osteomyelitis (HCC) 07/04/2021   Opioid use disorder, severe, dependence (HCC) 12/15/2020   Hepatitis C 12/15/2020   MDD (major depressive disorder), recurrent severe, 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

## 2022-09-07 NOTE — ED Notes (Addendum)
Patient resting in room with eyes closed. Respirations even and unlabored. No acute distress noted. Environment secured. Will continue to monitor for safety.

## 2022-09-07 NOTE — ED Notes (Signed)
Pt sleeping in no acute distress. RR even and unlabored. Environment secured. Will continue to monitor for safety. 

## 2022-09-07 NOTE — Care Management (Addendum)
[facility] Care Management   11:55am  Writer left a voice mail message at [facility],   [place] reports that they did not receive the faxed referral form.  Writer will re-fax the referral packet to [phone]  12:55pm  Per Alcario Drought with [facility] they do not have any open beds.  1:06pm  Per Eber Jones at [facility] the patient was declined due his [employer].  Per Eber Jones [facility] will only accept Medicaid for detox and they will not accept Medicaid for treatment.    1:36pm  Per Alcario Drought with [facility] the patient there have been some discharges.   Per Alcario Drought in order for the patient to be considered for placement if he has another UDS and tests negatively.  Per Alcario Drought, if the patient is accepted to the program they do not provide transportation and the program is located in [place].  Writer will request an updated UDS so that it can be faxed to [facility].

## 2022-09-07 NOTE — Group Note (Signed)
Group Topic: Change and Accountability  Group Date: 09/07/2022 Start Time: 2000 End Time: 2030 Facilitators: Rae Lips B  Department: [facility]  Number of Participants: 1  Group Focus: activities of daily living skills Treatment Modality:  Leisure Development Interventions utilized were support Purpose: express feelings  Name: Joshua Thomas Date of Birth: Sep 29, 1975  MR: [id number]    Level of Participation: pt did not attend group Quality of Participation: NA Interactions with others: NA Mood/Affect: NA Triggers (if applicable): NA Cognition: NA Progress: Other Response: NA Plan: patient will be encouraged to go to groups  Patients Problems:  Patient Active Problem List   Diagnosis Date Noted   Polysubstance abuse (HCC) 09/03/2022   Substance induced mood disorder (HCC) 07/29/2022   Right inguinal hernia 05/24/2022   Abdominal adhesions 05/24/2022   Epidural abscess 10/08/2021   Diabetes mellitus (HCC) 10/08/2021   Bilateral low back pain with left-sided sciatica    Acute osteomyelitis (HCC) 07/04/2021   Opioid use disorder, severe, dependence (HCC) 12/15/2020   Hepatitis C 12/15/2020   MDD (major depressive disorder), recurrent severe, 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

## 2022-09-07 NOTE — ED Notes (Signed)
Patient is sleeping. Respirations equal and unlabored, skin warm and dry. No change in assessment or acuity. Routine safety checks conducted according to facility protocol. Will continue to monitor for safety.   

## 2022-09-07 NOTE — ED Notes (Addendum)
Patient is sleeping. Respirations equal and unlabored, skin warm and dry. No change in assessment or acuity. Routine safety checks conducted according to facility protocol. Will continue to monitor for safety.   

## 2022-09-07 NOTE — Group Note (Signed)
Group Topic: Fears and Unhealthy Coping Skills  Group Date: 09/07/2022 Start Time: 1046 End Time: 1100 Facilitators: Priscille Kluver, NT  Department: [facility]  Number of Participants: 1  Group Focus: coping skills Treatment Modality:  Individual Therapy Interventions utilized were support Purpose: enhance coping skills  Name: Joshua Thomas Date of Birth: [birth year]  MR: [id number]    Level of Participation: Pt did not attend group  Patients Problems:  Patient Active Problem List   Diagnosis Date Noted   Polysubstance abuse (HCC) 09/03/2022   Substance induced mood disorder (HCC) 07/29/2022   Right inguinal hernia 05/24/2022   Abdominal adhesions 05/24/2022   Epidural abscess 10/08/2021   Diabetes mellitus (HCC) 10/08/2021   Bilateral low back pain with left-sided sciatica    Acute osteomyelitis (HCC) 07/04/2021   Opioid use disorder, severe, dependence (HCC) 12/15/2020   Hepatitis C 12/15/2020   MDD (major depressive disorder), recurrent severe, 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

## 2022-09-08 NOTE — ED Notes (Signed)
Pt wont keep his door open. Saids the light is too bight. MHT opened it and he keeps shutting it.

## 2022-09-08 NOTE — Care Management (Addendum)
[facility] Care Management   Declined - [facility], per [place] the patient is declined due to having [employer].  His [facility] is out of network for their facility   Declined - [facility], per [place] the patient is declined due to having [employer].  His [facility] is out of network for their facility   Declined - [facility], per Eber Jones, the patient was declined due his insurance. Per Eber Jones [facility] will only accept Medicaid for detox and they will not accept Medicaid for treatment.   Declined - [facility] no open beds.   Declined - [facility] per Marcelino Duster, the patient was declined due to his insurance   Declined - [facility], there are no bed and they do not have a wait list.   9:49am  [facility] accepts [employer].   Per intake worker the patient would have to make payment arrangement to pay $500 after he has been discharged from the program.. Due to his [employer] a $500 co-pay is required but the amount can be paid $30 days after the patient has completed the program.  The program is for 30 days.  Writer will fax referral.   Writer spoke to the patient who reports that he does not have $500 to pay in order to go to [facility].

## 2022-09-08 NOTE — ED Notes (Signed)
Patient asleep in bed without issue or consern.  Will monitor.

## 2022-09-08 NOTE — ED Notes (Signed)
Patient is sleeping. Respirations equal and unlabored, skin warm and dry. No change in assessment or acuity. Routine safety checks conducted according to facility protocol. Will continue to monitor for safety.   

## 2022-09-08 NOTE — ED Provider Notes (Signed)
Behavioral Health Progress Note  Date and Time: 09/08/2022 9:58 AM Name: Joshua Thomas MRN:  [id number]  Subjective: Patient says he feels a lot better and attributes the resolution of his back pain to the prescribed baclofen. He denies experiencing any cravings for any substances at this time.  Patient still wants to pursue residential treatment and asked to speak with social worker.  Patient does not endorse any side-effects they attribute to medications. Patient does not endorse any somatic complaints  Diagnosis:  Final diagnoses:  Opioid use disorder  Substance induced mood disorder (HCC)  Alcohol use disorder   Total Time spent with patient: 45 minutes   Past Psychiatric History: Bipolar Disorder, depression, ADHD, opioid use disorder, PTSD  Past Medical History: COPD, chronic back pain, degenerative disc disease, Hep C, hypertension, sciatica Family History: Father-alcohol abuse, cirrhosis, brother committed suicide Social History: 47 y/o male, homeless and unemployed  Appetite: Good  Current Medications: Current Facility-Administered Medications  Medication Dose Route Frequency Provider Last Rate Last Admin   alum & mag hydroxide-simeth (MAALOX/MYLANTA) 200-200-20 MG/5ML suspension 30 mL  30 mL Oral Q4H PRN Augusto Gamble, MD       baclofen (LIORESAL) tablet 5 mg  5 mg Oral Q8H PRN Augusto Gamble, MD   5 mg at 09/07/22 2138   bismuth subsalicylate (PEPTO BISMOL) chewable tablet 524 mg  524 mg Oral Q3H PRN Augusto Gamble, MD       buprenorphine-naloxone (SUBOXONE) 8-2 mg per SL tablet 1 tablet  1 tablet Sublingual BID Bobbitt, Shalon E, NP   1 tablet at 09/07/22 2007   dicyclomine (BENTYL) tablet 20 mg  20 mg Oral Q6H PRN Augusto Gamble, MD       haloperidol (HALDOL) tablet 5 mg  5 mg Oral Q6H PRN Augusto Gamble, MD       And   LORazepam (ATIVAN) tablet 1 mg  1 mg Oral Q6H PRN Augusto Gamble, MD       And   diphenhydrAMINE (BENADRYL) capsule 25 mg  25 mg Oral Q6H PRN Augusto Gamble, MD        haloperidol lactate (HALDOL) injection 5 mg  5 mg Intramuscular Q6H PRN Augusto Gamble, MD       And   LORazepam (ATIVAN) injection 1 mg  1 mg Intravenous Q6H PRN Augusto Gamble, MD       And   diphenhydrAMINE (BENADRYL) injection 25 mg  25 mg Intramuscular Q6H PRN Augusto Gamble, MD       divalproex (DEPAKOTE) DR tablet 250 mg  250 mg Oral QHS Bobbitt, Shalon E, NP   250 mg at 09/07/22 2137   hydrOXYzine (ATARAX) tablet 25 mg  25 mg Oral TID PRN Augusto Gamble, MD       ibuprofen (ADVIL) tablet 600 mg  600 mg Oral Q6H PRN Augusto Gamble, MD   600 mg at 09/07/22 1732   lidocaine (LIDODERM) 5 % 1 patch  1 patch Transdermal Daily Augusto Gamble, MD   1 patch at 09/06/22 1739   melatonin tablet 3 mg  3 mg Oral QHS PRN Augusto Gamble, MD       metFORMIN (GLUCOPHAGE) tablet 250 mg  250 mg Oral BID WC Bobbitt, Shalon E, NP   250 mg at 09/07/22 1719   naproxen (NAPROSYN) tablet 500 mg  500 mg Oral BID PRN Augusto Gamble, MD       nicotine (NICODERM CQ - dosed in mg/24 hours) patch 21 mg  21 mg Transdermal Daily Carlyn Reichert, MD  21 mg at 09/07/22 0911   ondansetron (ZOFRAN) tablet 8 mg  8 mg Oral Q8H PRN Augusto Gamble, MD       polyethylene glycol (MIRALAX / GLYCOLAX) packet 17 g  17 g Oral Daily PRN Augusto Gamble, MD   17 g at 09/06/22 2154   prazosin (MINIPRESS) capsule 1 mg  1 mg Oral QHS Bobbitt, Shalon E, NP   1 mg at 09/07/22 2136   QUEtiapine (SEROQUEL) tablet 100 mg  100 mg Oral QHS Bobbitt, Shalon E, NP   100 mg at 09/07/22 2136   senna (SENOKOT) tablet 8.6 mg  1 tablet Oral QHS PRN Augusto Gamble, MD       sertraline (ZOLOFT) tablet 100 mg  100 mg Oral Daily Bobbitt, Shalon E, NP   100 mg at 09/07/22 8469   Current Outpatient Medications  Medication Sig Dispense Refill   Aspirin-Acetaminophen-Caffeine (GOODY HEADACHE PO) Take 1 packet by mouth daily as needed (for pain).     Buprenorphine HCl-Naloxone HCl (SUBOXONE) 8-2 MG FILM Place 1 Film under the tongue 2 (two) times daily.     Calcium Carbonate Antacid (TUMS  PO) Take 2-3 tablets by mouth daily as needed (gerd).     cloNIDine (CATAPRES) 0.1 MG tablet Take 0.1 mg by mouth 2 (two) times daily.     cyclobenzaprine (FLEXERIL) 10 MG tablet Take 10 mg by mouth 2 (two) times daily as needed for muscle spasms.     divalproex (DEPAKOTE) 250 MG DR tablet Take 250 mg by mouth at bedtime.     hydrOXYzine (VISTARIL) 50 MG capsule Take 50 mg by mouth 2 (two) times daily as needed for anxiety.     ibuprofen (ADVIL) 200 MG tablet Take 800 mg by mouth every 6 (six) hours as needed for mild pain or headache.     Melatonin 10 MG TABS Take 10 mg by mouth at bedtime.     naloxone (NARCAN) nasal spray 4 mg/0.1 mL Take in case of overdose 1 each 0   prazosin (MINIPRESS) 1 MG capsule Take 1 mg by mouth at bedtime.     QUEtiapine (SEROQUEL) 100 MG tablet Take 100 mg by mouth at bedtime.     rOPINIRole (REQUIP) 0.5 MG tablet Take 0.5 mg by mouth at bedtime as needed (for restless legs).     sertraline (ZOLOFT) 50 MG tablet Take 100 mg by mouth daily.      Labs  Lab Results:     Latest Ref Rng & Units 09/03/2022    6:48 PM 07/29/2022    8:45 AM 05/28/2022   11:18 PM  CBC  WBC 4.0 - 10.5 K/uL 5.5  5.7  5.5   Hemoglobin 13.0 - 17.0 g/dL 62.9  52.8  41.3   Hematocrit 39.0 - 52.0 % 44.3  39.5  43.4   Platelets 150 - 400 K/uL 176  167  193       Latest Ref Rng & Units 09/04/2022    2:17 PM 09/03/2022    6:48 PM 07/29/2022    8:45 AM  CMP  Glucose 70 - 99 mg/dL  244  010   BUN 6 - 20 mg/dL  14  8   Creatinine 2.72 - 1.24 mg/dL  5.36  6.44   Sodium 034 - 145 mmol/L  136  135   Potassium 3.5 - 5.1 mmol/L 4.0  3.4  3.3   Chloride 98 - 111 mmol/L  103  103   CO2 22 - 32 mmol/L  24  23   Calcium 8.9 - 10.3 mg/dL  9.4  8.5   Total Protein 6.5 - 8.1 g/dL  7.5  7.3   Total Bilirubin 0.3 - 1.2 mg/dL  0.3  0.4   Alkaline Phos 38 - 126 U/L  59  46   AST 15 - 41 U/L  32  39   ALT 0 - 44 U/L  53  44     Blood Alcohol level:  Lab Results  Component Value Date   ETH <10  09/03/2022   ETH <10 07/29/2022   Metabolic Disorder Labs: Lab Results  Component Value Date   HGBA1C 7.3 (H) 09/04/2022   MPG 162.81 09/04/2022   MPG 145.59 05/21/2022   Lab Results  Component Value Date   PROLACTIN 31.3 (H) 05/23/2015   Lab Results  Component Value Date   CHOL 129 12/09/2020   TRIG 84 12/09/2020   HDL 29 (L) 12/09/2020   CHOLHDL 4.4 12/09/2020   VLDL 17 12/09/2020   LDLCALC 83 12/09/2020   LDLCALC 49 10/08/2020   Therapeutic Lab Levels: No results found for: "LITHIUM" Lab Results  Component Value Date   VALPROATE <10 (L) 09/04/2022   No results found for: "CBMZ" Physical Findings   AIMS    Flowsheet Row [facility]) [facility]Ad[facility] (Discharged) [facility]) [facility]  AIMS Total Score 0 0 0      AUDIT    Flowsheet Row [facility]) [facility]Ad[facility] (Discharged) [facility] ([facility]  Alcohol Use Disorder Identification Test Final Score (AUDIT) 1 1 6  0      GAD-7    Flowsheet Row Office Visit from 01/31/2018 in [facility] Office Visit from 12/13/2017 in [facility] Office Visit from 11/29/2017 in [facility]  Total GAD-7 Score 19 19 6       PHQ2-9    Flowsheet Row ED from 09/03/2022 in [facility] Office Visit from 03/14/2018 in [facility] Office Visit from 02/28/2018 in [facility] Office Visit from  02/14/2018 in [facility] Office Visit from 01/31/2018 in [facility]  PHQ-2 Total Score 0 2 3 6 4   PHQ-9 Total Score 5 15 18 23 17       Flowsheet Row ED from 09/03/2022 in [facility] Most recent reading at 09/04/2022  2:01 AM ED from 09/03/2022 in [facility] at [facility] Most recent reading at 09/03/2022  6:33 PM ED from 07/29/2022 in [facility] Department at [facility] Most recent reading at 07/29/2022  8:25 AM  C-SSRS RISK CATEGORY No Risk High Risk High Risk       Musculoskeletal  Strength & Muscle Tone: within normal limits Gait & Station: normal Patient leans: N/A  Psychiatric Specialty Exam  Presentation  General Appearance: Appropriate for Environment; Casual   Eye Contact:Good   Speech:Clear and Coherent; Normal Rate   Speech Volume:Normal   Handedness:Right   Mood and Affect  Mood:-- ("I feel better")   Affect:Appropriate;  Congruent; Full Range   Thought Process  Thought Processes:Coherent; Linear; Goal Directed   Descriptions of Associations:Intact   Orientation:Full (Time, Place and Person)   Thought Content:Logical; WDL  Diagnosis of Schizophrenia or Schizoaffective disorder in past: No    Hallucinations:Hallucinations: None   Ideas of Reference:None   Suicidal Thoughts:Suicidal Thoughts: No SI Passive Intent and/or Plan: Without Intent; Without Plan   Homicidal Thoughts:Homicidal Thoughts: No   Sensorium  Memory:Immediate Good; Recent Good; Remote Good   Judgment:Good   Insight:Good   Executive Functions  Concentration:Good   Attention Span:Good   Recall:Good   Fund of Knowledge:Good   Language:Good   Psychomotor Activity  Psychomotor Activity:Psychomotor Activity: Normal   Assets  Assets:Resilience   Sleep  Sleep:Sleep: Good   Physical Exam  Physical Exam Vitals and  nursing note reviewed.  HENT:     Head: Normocephalic and atraumatic.  Pulmonary:     Effort: Pulmonary effort is normal.  Musculoskeletal:     Cervical back: Normal range of motion.  Neurological:     General: No focal deficit present.     Mental Status: He is alert. Mental status is at baseline.    Review of Systems  Constitutional: Negative.   Respiratory: Negative.    Cardiovascular: Negative.   Gastrointestinal: Negative.   Genitourinary: Negative.   Psychiatric/Behavioral:         Psychiatric subjective data addressed in PSE or HPI / daily subjective report   Blood pressure 132/81, pulse 72, temperature 98.3 F (36.8 C), temperature source Oral, resp. rate 20, SpO2 99%. There is no height or weight on file to calculate BMI.  Treatment Plan Summary: Daily contact with patient to assess and evaluate symptoms and progress in treatment and Medication management: -- continue home buprenorphine-naloxone 8-2 for opioid medication assisted therapy -- continue home valproic acid 250 mg daily for mood stabilization  -- continue home quetiapine 100 mg daily at bedtime for sleep  -- continue home sertraline 100 mg daily for depressive symptoms  -- continue home prazosin 1 mg daily at bedtime for nightmares -- COWS protocol -- medical regimen: metformin -- Patient in need of nicotine replacement; nicotine polacrilex (gum) and nicotine patch 21 mg / 24 hours ordered. Smoking cessation encouraged  PRNs              -- continue ibuprofen 600 mg every 6 hours as needed for mild to moderate pain, fever, and headaches              -- continue hydroxyzine 25 mg three times a day as needed for anxiety              -- continue bismuth subsalicylate 524 mg oral chewable tablet every 3 hours as needed for diarrhea / loose stools              -- continue senna 8.6 mg oral at bedtime and polyethylene glycol 17 g oral daily as needed for mild to moderate constipation              -- continue  ondansetron 8 mg every 8 hours as needed for nausea or vomiting              -- continue aluminum-magnesium hydroxide + simethicone 30 mL every 4 hours as needed for heartburn or indigestion              -- continue melatonin 3 mg at bedtime as needed for insomnia  -- -- Opiate withdrawal supportive care: as  needed loperamide, naproxen, dicyclomine, and baclofen  -- As needed agitation protocol in-place  Augusto Gamble, MD 09/08/22 9:58 AM

## 2022-09-08 NOTE — Group Note (Signed)
Group Topic: Relapse and Recovery  Group Date: 09/08/2022 Start Time: 2000 End Time: 2100 Facilitators: Rae Lips B  Department: [facility]  Number of Participants: 2  Group Focus: abuse issues, acceptance, activities of daily living skills, and communication Treatment Modality:  Leisure Development Interventions utilized were support Purpose: express feelings and relapse prevention strategies  Name: Joshua Thomas Date of Birth: 1975-02-16  MR: [id number]    Level of Participation: active Quality of Participation: attentive and cooperative Interactions with others: gave feedback Mood/Affect: appropriate Triggers (if applicable): NA Cognition: coherent/clear Progress: Gaining insight Response: NA Plan: patient will be encouraged to keep going to groups.   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

## 2022-09-08 NOTE — ED Notes (Signed)
Pt asleep at this time. NAD. Respirations are even and unlabored. Will continue to monitor for safety.

## 2022-09-08 NOTE — ED Notes (Signed)
Patient is resting in bed without issue.

## 2022-09-08 NOTE — Group Note (Signed)
Group Topic: Healthy Self Image and Positive Change  Group Date: 09/08/2022 Start Time: 1145 End Time: 1200 Facilitators: Jenean Lindau, RN  Department: [facility]  Number of Participants: 3  Group Focus: affirmation Treatment Modality:  Behavior Modification Therapy Interventions utilized were exploration Purpose: express feelings  Name: Joshua Thomas Date of Birth: [birth year]  MR: [id number]    Level of Participation: active Quality of Participation: attentive Interactions with others: gave feedback Mood/Affect: appropriate Triggers (if applicable):   Cognition: coherent/clear Progress: Gaining insight Response:   Plan: follow-up needed  Patients Problems:  Patient Active Problem List   Diagnosis Date Noted   Polysubstance abuse (HCC) 09/03/2022   Substance induced mood disorder (HCC) 07/29/2022   Right inguinal hernia 05/24/2022   Abdominal adhesions 05/24/2022   Epidural abscess 10/08/2021   Diabetes mellitus (HCC) 10/08/2021   Bilateral low back pain with left-sided sciatica    Acute osteomyelitis (HCC) 07/04/2021   Opioid use disorder, severe, dependence (HCC) 12/15/2020   Hepatitis C 12/15/2020   MDD (major depressive disorder), recurrent severe, 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

## 2022-09-08 NOTE — Group Note (Signed)
Group Topic: Healthy Self Image and Positive Change  Group Date: 09/08/2022 Start Time: 1230 End Time: 1300 Facilitators: Vonzell Schlatter B  Department: [facility]  Number of Participants: 3  Group Focus: affirmation and daily focus Treatment Modality:  Psychoeducation Interventions utilized were group exercise Purpose: express feelings and relapse prevention strategies  Name: Joshua Thomas Date of Birth: 11-Jan-1976  MR: [id number]    Level of Participation: moderate Quality of Participation: attentive Interactions with others: gave feedback Mood/Affect: positive Triggers (if applicable): n/a Cognition: coherent/clear Progress: Moderate Response: n/a Plan: follow-up needed  Patients Problems:  Patient Active Problem List   Diagnosis Date Noted   Polysubstance abuse (HCC) 09/03/2022   Substance induced mood disorder (HCC) 07/29/2022   Right inguinal hernia 05/24/2022   Abdominal adhesions 05/24/2022   Epidural abscess 10/08/2021   Diabetes mellitus (HCC) 10/08/2021   Bilateral low back pain with left-sided sciatica    Acute osteomyelitis (HCC) 07/04/2021   Opioid use disorder, severe, dependence (HCC) 12/15/2020   Hepatitis C 12/15/2020   MDD (major depressive disorder), recurrent severe, 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

## 2022-09-08 NOTE — ED Notes (Signed)
Patient is in the Dayroom watching TV. Had concerns about his muscle relaxer dosage. Reassured will communicate to the provider. NAD. Respirations are even and unlabored. Will continue to monitor for safety.

## 2022-09-09 MED ORDER — BUPRENORPHINE HCL-NALOXONE HCL 2-0.5 MG SL SUBL
1.0000 | SUBLINGUAL_TABLET | Freq: Two times a day (BID) | SUBLINGUAL | Status: DC
  Administered 2022-09-11 – 2022-09-14 (×6): 1 via SUBLINGUAL
  Filled 2022-09-09 (×6): qty 1

## 2022-09-09 MED ORDER — BUPRENORPHINE HCL-NALOXONE HCL 2-0.5 MG SL SUBL
2.0000 | SUBLINGUAL_TABLET | Freq: Two times a day (BID) | SUBLINGUAL | Status: DC
Start: 2022-09-09 — End: 2022-09-09

## 2022-09-09 MED ORDER — BUPRENORPHINE HCL-NALOXONE HCL 8-2 MG SL SUBL: 1.0000 | SUBLINGUAL_TABLET | Freq: Every day | SUBLINGUAL | Status: DC

## 2022-09-09 MED ORDER — BUPRENORPHINE HCL-NALOXONE HCL 2-0.5 MG SL SUBL
2.0000 | SUBLINGUAL_TABLET | Freq: Two times a day (BID) | SUBLINGUAL | Status: AC
  Administered 2022-09-09 – 2022-09-11 (×4): 2 via SUBLINGUAL
  Filled 2022-09-09 (×4): qty 2

## 2022-09-09 NOTE — Group Note (Signed)
Group Topic: Relapse and Recovery  Group Date: 09/09/2022 Start Time: 2000 End Time: 2100 Facilitators: Vassie Loll, RN  Department: [facility]  Number of Participants: 3  Group Focus: relapse prevention Treatment Modality:  Psychoeducation Interventions utilized were support Purpose: relapse prevention strategies  Name: Joshua Thomas Date of Birth: January 09, 1976  MR: [id number]    Level of Participation: active Quality of Participation: attentive Interactions with others: gave feedback Mood/Affect: appropriate Triggers (if applicable): n/a Cognition: coherent/clear Progress: Moderate Response: n/a Plan: follow-up needed  Patients Problems:  Patient Active Problem List   Diagnosis Date Noted   Polysubstance abuse (HCC) 09/03/2022   Substance induced mood disorder (HCC) 07/29/2022   Right inguinal hernia 05/24/2022   Abdominal adhesions 05/24/2022   Epidural abscess 10/08/2021   Diabetes mellitus (HCC) 10/08/2021   Bilateral low back pain with left-sided sciatica    Acute osteomyelitis (HCC) 07/04/2021   Opioid use disorder, severe, dependence (HCC) 12/15/2020   Hepatitis C 12/15/2020   MDD (major depressive disorder), recurrent severe, 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

## 2022-09-09 NOTE — ED Notes (Signed)
Patient in the dayroom eating dinner.

## 2022-09-09 NOTE — ED Notes (Signed)
Patient is calm and cooperative. He denies SI/HI/AVH. Patient reports he didn't get much sleep last night. Patient has been mostly to  himself today with minimal interactions. He states he is confused about his discharge plans. States he thought he was leaving today. Patient has sense spoken with the provider. Mr. Biddy denies any additional needs at this time. Will continue to monitor for safety.

## 2022-09-09 NOTE — ED Notes (Signed)
 Patient in the bedroom sleeping. NAD.  Respirations are even and unlabored. Will continue to monitor for safety.

## 2022-09-09 NOTE — Care Management (Addendum)
[facility] Care Management   Writer referred patient to [facility], [address], [place], [place],   Clinical research associate met with the patient, Dr. Lucianne Muss and Dr, Jodie Echevaria.  Due to the patient being on Suboxone and having [employer] the patient has not been accepted to any inpatient substance abuse facilities.    Writer will provide patient with resources to [employer] [facility] [place] and the [employer].    4pm  Patient has agreed to be tapered off Suboxone.  Writer left a voice mail message with Floydene Flock to re-consider the patient for placement.  Writer re-faxed the referral.   Engineer, building services [employer] and they reports that the patient is abel to come to the shelter as long as he is not taking Suboxone and he completes a phone interview.

## 2022-09-09 NOTE — ED Notes (Signed)
Pt is in the dayroom watching TV with peers.pt verbalizes how nervous he is about his replacement, he is hoping not to go to [place] because that might be far for him. Pt requested to print information about [facility] recovery services for him. Pt denies SI/HI/AVH. No acute distress noted. Will continue to monitor for safety and provide support.

## 2022-09-09 NOTE — ED Provider Notes (Addendum)
Behavioral Health Progress Note  Date and Time: 09/09/2022 8:47 AM Name: Joshua Thomas MRN:  [id number]  Subjective: Patient says, "I'll feel a lot better when I know where I'm going," in reference to his residential rehab placement. He denies experiencing any cravings for any substances at this time.  Patient still wants to pursue residential treatment and shares he is open to even going out of state for placement as he understands his insurance to be limiting his options. He would like the team to reach out to his mom to ask her which program she was recommending for him to attend in [place].  Patient does not endorse any side-effects they attribute to medications. Patient does not endorse any somatic complaints  Diagnosis:  Final diagnoses:  Opioid use disorder  Substance induced mood disorder (HCC)  Alcohol use disorder   Total Time spent with patient: 45 minutes   Past Psychiatric History: Bipolar Disorder, depression, ADHD, opioid use disorder, PTSD  Past Medical History: COPD, chronic back pain, degenerative disc disease, Hep C, hypertension, sciatica Family History: Father-alcohol abuse, cirrhosis, brother committed suicide Social History: 47 y/o male, homeless and unemployed  Appetite: Good  Current Medications: Current Facility-Administered Medications  Medication Dose Route Frequency Provider Last Rate Last Admin   alum & mag hydroxide-simeth (MAALOX/MYLANTA) 200-200-20 MG/5ML suspension 30 mL  30 mL Oral Q4H PRN Augusto Gamble, MD       baclofen (LIORESAL) tablet 5 mg  5 mg Oral Q8H PRN Augusto Gamble, MD   5 mg at 09/08/22 1822   bismuth subsalicylate (PEPTO BISMOL) chewable tablet 524 mg  524 mg Oral Q3H PRN Augusto Gamble, MD       buprenorphine-naloxone (SUBOXONE) 8-2 mg per SL tablet 1 tablet  1 tablet Sublingual BID Bobbitt, Shalon E, NP   1 tablet at 09/08/22 1912   dicyclomine (BENTYL) tablet 20 mg  20 mg Oral Q6H PRN Augusto Gamble, MD       haloperidol (HALDOL) tablet 5  mg  5 mg Oral Q6H PRN Augusto Gamble, MD       And   LORazepam (ATIVAN) tablet 1 mg  1 mg Oral Q6H PRN Augusto Gamble, MD       And   diphenhydrAMINE (BENADRYL) capsule 25 mg  25 mg Oral Q6H PRN Augusto Gamble, MD       haloperidol lactate (HALDOL) injection 5 mg  5 mg Intramuscular Q6H PRN Augusto Gamble, MD       And   LORazepam (ATIVAN) injection 1 mg  1 mg Intravenous Q6H PRN Augusto Gamble, MD       And   diphenhydrAMINE (BENADRYL) injection 25 mg  25 mg Intramuscular Q6H PRN Augusto Gamble, MD       divalproex (DEPAKOTE) DR tablet 250 mg  250 mg Oral QHS Bobbitt, Shalon E, NP   250 mg at 09/08/22 2122   hydrOXYzine (ATARAX) tablet 25 mg  25 mg Oral TID PRN Augusto Gamble, MD   25 mg at 09/08/22 2122   ibuprofen (ADVIL) tablet 600 mg  600 mg Oral Q6H PRN Augusto Gamble, MD   600 mg at 09/08/22 2122   lidocaine (LIDODERM) 5 % 1 patch  1 patch Transdermal Daily Augusto Gamble, MD   1 patch at 09/08/22 1049   melatonin tablet 3 mg  3 mg Oral QHS PRN Augusto Gamble, MD   3 mg at 09/08/22 2122   metFORMIN (GLUCOPHAGE) tablet 250 mg  250 mg Oral BID WC Bobbitt, Franchot Mimes, NP  250 mg at 09/08/22 1715   naproxen (NAPROSYN) tablet 500 mg  500 mg Oral BID PRN Augusto Gamble, MD       nicotine (NICODERM CQ - dosed in mg/24 hours) patch 21 mg  21 mg Transdermal Daily Carlyn Reichert, MD   21 mg at 09/08/22 1049   ondansetron (ZOFRAN) tablet 8 mg  8 mg Oral Q8H PRN Augusto Gamble, MD       polyethylene glycol (MIRALAX / GLYCOLAX) packet 17 g  17 g Oral Daily PRN Augusto Gamble, MD   17 g at 09/06/22 2154   prazosin (MINIPRESS) capsule 1 mg  1 mg Oral QHS Bobbitt, Shalon E, NP   1 mg at 09/08/22 2122   QUEtiapine (SEROQUEL) tablet 100 mg  100 mg Oral QHS Bobbitt, Shalon E, NP   100 mg at 09/08/22 2122   senna (SENOKOT) tablet 8.6 mg  1 tablet Oral QHS PRN Augusto Gamble, MD       sertraline (ZOLOFT) tablet 100 mg  100 mg Oral Daily Bobbitt, Shalon E, NP   100 mg at 09/08/22 1050   Current Outpatient Medications  Medication Sig Dispense  Refill   Aspirin-Acetaminophen-Caffeine (GOODY HEADACHE PO) Take 1 packet by mouth daily as needed (for pain).     Buprenorphine HCl-Naloxone HCl (SUBOXONE) 8-2 MG FILM Place 1 Film under the tongue 2 (two) times daily.     Calcium Carbonate Antacid (TUMS PO) Take 2-3 tablets by mouth daily as needed (gerd).     cloNIDine (CATAPRES) 0.1 MG tablet Take 0.1 mg by mouth 2 (two) times daily.     cyclobenzaprine (FLEXERIL) 10 MG tablet Take 10 mg by mouth 2 (two) times daily as needed for muscle spasms.     divalproex (DEPAKOTE) 250 MG DR tablet Take 250 mg by mouth at bedtime.     hydrOXYzine (VISTARIL) 50 MG capsule Take 50 mg by mouth 2 (two) times daily as needed for anxiety.     ibuprofen (ADVIL) 200 MG tablet Take 800 mg by mouth every 6 (six) hours as needed for mild pain or headache.     Melatonin 10 MG TABS Take 10 mg by mouth at bedtime.     naloxone (NARCAN) nasal spray 4 mg/0.1 mL Take in case of overdose 1 each 0   prazosin (MINIPRESS) 1 MG capsule Take 1 mg by mouth at bedtime.     QUEtiapine (SEROQUEL) 100 MG tablet Take 100 mg by mouth at bedtime.     rOPINIRole (REQUIP) 0.5 MG tablet Take 0.5 mg by mouth at bedtime as needed (for restless legs).     sertraline (ZOLOFT) 50 MG tablet Take 100 mg by mouth daily.      Labs  Lab Results:     Latest Ref Rng & Units 09/03/2022    6:48 PM 07/29/2022    8:45 AM 05/28/2022   11:18 PM  CBC  WBC 4.0 - 10.5 K/uL 5.5  5.7  5.5   Hemoglobin 13.0 - 17.0 g/dL 40.9  81.1  91.4   Hematocrit 39.0 - 52.0 % 44.3  39.5  43.4   Platelets 150 - 400 K/uL 176  167  193       Latest Ref Rng & Units 09/04/2022    2:17 PM 09/03/2022    6:48 PM 07/29/2022    8:45 AM  CMP  Glucose 70 - 99 mg/dL  782  956   BUN 6 - 20 mg/dL  14  8   Creatinine 2.13 -  1.24 mg/dL  8.65  7.84   Sodium 696 - 145 mmol/L  136  135   Potassium 3.5 - 5.1 mmol/L 4.0  3.4  3.3   Chloride 98 - 111 mmol/L  103  103   CO2 22 - 32 mmol/L  24  23   Calcium 8.9 - 10.3 mg/dL  9.4   8.5   Total Protein 6.5 - 8.1 g/dL  7.5  7.3   Total Bilirubin 0.3 - 1.2 mg/dL  0.3  0.4   Alkaline Phos 38 - 126 U/L  59  46   AST 15 - 41 U/L  32  39   ALT 0 - 44 U/L  53  44     Blood Alcohol level:  Lab Results  Component Value Date   ETH <10 09/03/2022   ETH <10 07/29/2022   Metabolic Disorder Labs: Lab Results  Component Value Date   HGBA1C 7.3 (H) 09/04/2022   MPG 162.81 09/04/2022   MPG 145.59 05/21/2022   Lab Results  Component Value Date   PROLACTIN 31.3 (H) 05/23/2015   Lab Results  Component Value Date   CHOL 129 12/09/2020   TRIG 84 12/09/2020   HDL 29 (L) 12/09/2020   CHOLHDL 4.4 12/09/2020   VLDL 17 12/09/2020   LDLCALC 83 12/09/2020   LDLCALC 49 10/08/2020   Therapeutic Lab Levels: No results found for: "LITHIUM" Lab Results  Component Value Date   VALPROATE <10 (L) 09/04/2022   No results found for: "CBMZ" Physical Findings   AIMS    Flowsheet Row [facility]) [facility]-Admission (Discharged) [facility]) [facility]  AIMS Total Score 0 0 0      AUDIT    Flowsheet Row [facility]) [facility] (Discharged[facility]  Alcohol Use Disorder Identification Test Final Score (AUDIT) 1 1 6  0      GAD-7    Flowsheet Row Office Visit from 01/31/2018 in [facility] Office Visit from 12/13/2017 in [facility] Office Visit from 11/29/2017 in [facility]  Total GAD-7 Score 19 19  6       PHQ2-9    Flowsheet Row ED from 09/03/2022 in [facility] Office Visit from 03/14/2018 in [facility] Office Visit from 02/28/2018 in [facility] Office Visit from 02/14/2018 in [facility] Office Visit from 01/31/2018 in [facility]  PHQ-2 Total Score 0 2 3 6 4   PHQ-9 Total Score 5 15 18 23 17       Flowsheet Row ED from 09/03/2022 in [facility] Most recent reading at 09/04/2022  2:01 AM ED from 09/03/2022 in [facility] Emergency Department at [facility] Most recent reading at 09/03/2022  6:33 PM ED from 07/29/2022 in [facility] Emergency Department at [facility] Most recent reading at 07/29/2022  8:25 AM  C-SSRS RISK CATEGORY No Risk High Risk High Risk       Musculoskeletal  Strength & Muscle Tone: within normal limits Gait & Station:  normal Patient leans: N/A  Psychiatric Specialty Exam  Presentation  General Appearance: Appropriate for Environment; Well Groomed   Eye Contact:Good   Speech:Clear and Coherent; Normal Rate   Speech Volume:Normal   Handedness:Right   Mood and Affect  Mood:-- ("I'll feel a lot better when I know where I'm going")   Affect:Appropriate; Congruent; Full Range   Thought Process  Thought Processes:Coherent; Linear; Goal Directed   Descriptions of Associations:Intact   Orientation:Full (Time, Place and Person)   Thought Content:Logical; WDL  Diagnosis of Schizophrenia or Schizoaffective disorder in past: No    Hallucinations:Hallucinations: None   Ideas of Reference:None   Suicidal Thoughts:Suicidal Thoughts: No SI Passive Intent and/or Plan: Without Intent; Without Plan   Homicidal Thoughts:Homicidal Thoughts: No   Sensorium  Memory:Immediate Good; Recent Good; Remote Good   Judgment:Good   Insight:Good   Executive Functions   Concentration:Good   Attention Span:Good   Recall:Good   Fund of Knowledge:Good   Language:Good   Psychomotor Activity  Psychomotor Activity:Psychomotor Activity: Normal   Assets  Assets:Resilience   Sleep  Sleep:Sleep: Good   Physical Exam  Physical Exam Vitals and nursing note reviewed.  HENT:     Head: Normocephalic and atraumatic.  Pulmonary:     Effort: Pulmonary effort is normal.  Musculoskeletal:     Cervical back: Normal range of motion.  Neurological:     General: No focal deficit present.     Mental Status: He is alert. Mental status is at baseline.    Review of Systems  Constitutional: Negative.   Respiratory: Negative.    Cardiovascular: Negative.   Gastrointestinal: Negative.   Genitourinary: Negative.   Psychiatric/Behavioral:         Psychiatric subjective data addressed in PSE or HPI / daily subjective report   Blood pressure 123/79, pulse (!) 51, temperature 98.3 F (36.8 C), temperature source Oral, resp. rate 20, SpO2 100%. There is no height or weight on file to calculate BMI.  Treatment Plan Summary: Daily contact with patient to assess and evaluate symptoms and progress in treatment and Medication management: -- continue home buprenorphine-naloxone 8-2 twice daily for opioid medication assisted therapy -- continue home valproic acid 250 mg daily for mood stabilization  -- continue home quetiapine 100 mg daily at bedtime for sleep  -- continue home sertraline 100 mg daily for depressive symptoms  -- continue home prazosin 1 mg daily at bedtime for nightmares -- COWS protocol -- medical regimen: metformin -- Patient in need of nicotine replacement; nicotine polacrilex (gum) and nicotine patch 21 mg / 24 hours ordered. Smoking cessation encouraged  PRNs              -- continue ibuprofen 600 mg every 6 hours as needed for mild to moderate pain, fever, and headaches              -- continue hydroxyzine 25 mg three times a day as  needed for anxiety              -- continue bismuth subsalicylate 524 mg oral chewable tablet every 3 hours as needed for diarrhea / loose stools              -- continue senna 8.6 mg oral at bedtime and polyethylene glycol 17 g oral daily as needed for mild to moderate constipation              -- continue ondansetron 8 mg every 8 hours as needed for nausea or vomiting              --  continue aluminum-magnesium hydroxide + simethicone 30 mL every 4 hours as needed for heartburn or indigestion              -- continue melatonin 3 mg at bedtime as needed for insomnia  -- -- Opiate withdrawal supportive care: as needed loperamide, naproxen, dicyclomine, and baclofen  -- As needed agitation protocol in-place  Augusto Gamble, MD 09/09/22 8:47 AM

## 2022-09-09 NOTE — ED Notes (Signed)
Patient observed resting quietly, eyes closed. Respirations equal and unlabored. Will continue to monitor for safety.  

## 2022-09-09 NOTE — ED Notes (Signed)
Lunch provided.

## 2022-09-10 NOTE — ED Provider Notes (Signed)
Behavioral Health Progress Note  Date and Time: 09/10/2022 8:59 AM Name: Joshua Thomas MRN:  [id number]  Subjective: Patient says, "I feel fine - just sitting here." He has not noticed any significant changes since his Suboxone dose was changed yesterday. He asks to clarify how much we will be further decreasing the dose by every day which I explained. He denies experiencing any cravings for any substances at this time.  Patient asks to speak to social worker to inquire if his options for residential placement have expanded given his plan to taper off Suboxone.  Patient does not endorse any side-effects they attribute to medications. Patient does not endorse any somatic complaints  Diagnosis:  Final diagnoses:  Opioid use disorder  Substance induced mood disorder (HCC)  Alcohol use disorder   Total Time spent with patient: 45 minutes   Past Psychiatric History: Bipolar Disorder, depression, ADHD, opioid use disorder, PTSD  Past Medical History: COPD, chronic back pain, degenerative disc disease, Hep C, hypertension, sciatica Family History: Father-alcohol abuse, cirrhosis, brother committed suicide Social History: 47 y/o male, homeless and unemployed  Appetite: Good  Current Medications: Current Facility-Administered Medications  Medication Dose Route Frequency Provider Last Rate Last Admin   alum & mag hydroxide-simeth (MAALOX/MYLANTA) 200-200-20 MG/5ML suspension 30 mL  30 mL Oral Q4H PRN Augusto Gamble, MD       baclofen (LIORESAL) tablet 5 mg  5 mg Oral Q8H PRN Augusto Gamble, MD   5 mg at 09/08/22 1822   bismuth subsalicylate (PEPTO BISMOL) chewable tablet 524 mg  524 mg Oral Q3H PRN Augusto Gamble, MD       buprenorphine-naloxone (SUBOXONE) 2-0.5 mg per SL tablet 2 tablet  2 tablet Sublingual BID Augusto Gamble, MD   2 tablet at 09/09/22 2108   Followed by   Melene Muller ON 09/11/2022] buprenorphine-naloxone (SUBOXONE) 2-0.5 mg per SL tablet 1 tablet  1 tablet Sublingual BID Augusto Gamble, MD        dicyclomine (BENTYL) tablet 20 mg  20 mg Oral Q6H PRN Augusto Gamble, MD       haloperidol (HALDOL) tablet 5 mg  5 mg Oral Q6H PRN Augusto Gamble, MD       And   LORazepam (ATIVAN) tablet 1 mg  1 mg Oral Q6H PRN Augusto Gamble, MD       And   diphenhydrAMINE (BENADRYL) capsule 25 mg  25 mg Oral Q6H PRN Augusto Gamble, MD       haloperidol lactate (HALDOL) injection 5 mg  5 mg Intramuscular Q6H PRN Augusto Gamble, MD       And   LORazepam (ATIVAN) injection 1 mg  1 mg Intravenous Q6H PRN Augusto Gamble, MD       And   diphenhydrAMINE (BENADRYL) injection 25 mg  25 mg Intramuscular Q6H PRN Augusto Gamble, MD       divalproex (DEPAKOTE) DR tablet 250 mg  250 mg Oral QHS Bobbitt, Shalon E, NP   250 mg at 09/09/22 2108   hydrOXYzine (ATARAX) tablet 25 mg  25 mg Oral TID PRN Augusto Gamble, MD   25 mg at 09/08/22 2122   ibuprofen (ADVIL) tablet 600 mg  600 mg Oral Q6H PRN Augusto Gamble, MD   600 mg at 09/08/22 2122   lidocaine (LIDODERM) 5 % 1 patch  1 patch Transdermal Daily Augusto Gamble, MD   1 patch at 09/09/22 0905   melatonin tablet 3 mg  3 mg Oral QHS PRN Augusto Gamble, MD   3 mg  at 09/08/22 2122   metFORMIN (GLUCOPHAGE) tablet 250 mg  250 mg Oral BID WC Bobbitt, Shalon E, NP   250 mg at 09/10/22 0806   naproxen (NAPROSYN) tablet 500 mg  500 mg Oral BID PRN Augusto Gamble, MD       nicotine (NICODERM CQ - dosed in mg/24 hours) patch 21 mg  21 mg Transdermal Daily Carlyn Reichert, MD   21 mg at 09/09/22 0906   ondansetron (ZOFRAN) tablet 8 mg  8 mg Oral Q8H PRN Augusto Gamble, MD       polyethylene glycol (MIRALAX / GLYCOLAX) packet 17 g  17 g Oral Daily PRN Augusto Gamble, MD   17 g at 09/06/22 2154   prazosin (MINIPRESS) capsule 1 mg  1 mg Oral QHS Bobbitt, Shalon E, NP   1 mg at 09/09/22 2108   QUEtiapine (SEROQUEL) tablet 100 mg  100 mg Oral QHS Bobbitt, Shalon E, NP   100 mg at 09/09/22 2108   senna (SENOKOT) tablet 8.6 mg  1 tablet Oral QHS PRN Augusto Gamble, MD       sertraline (ZOLOFT) tablet 100 mg  100 mg Oral Daily  Bobbitt, Shalon E, NP   100 mg at 09/09/22 5427   Current Outpatient Medications  Medication Sig Dispense Refill   Aspirin-Acetaminophen-Caffeine (GOODY HEADACHE PO) Take 1 packet by mouth daily as needed (for pain).     Buprenorphine HCl-Naloxone HCl (SUBOXONE) 8-2 MG FILM Place 1 Film under the tongue 2 (two) times daily.     Calcium Carbonate Antacid (TUMS PO) Take 2-3 tablets by mouth daily as needed (gerd).     cloNIDine (CATAPRES) 0.1 MG tablet Take 0.1 mg by mouth 2 (two) times daily.     cyclobenzaprine (FLEXERIL) 10 MG tablet Take 10 mg by mouth 2 (two) times daily as needed for muscle spasms.     divalproex (DEPAKOTE) 250 MG DR tablet Take 250 mg by mouth at bedtime.     hydrOXYzine (VISTARIL) 50 MG capsule Take 50 mg by mouth 2 (two) times daily as needed for anxiety.     ibuprofen (ADVIL) 200 MG tablet Take 800 mg by mouth every 6 (six) hours as needed for mild pain or headache.     Melatonin 10 MG TABS Take 10 mg by mouth at bedtime.     naloxone (NARCAN) nasal spray 4 mg/0.1 mL Take in case of overdose 1 each 0   prazosin (MINIPRESS) 1 MG capsule Take 1 mg by mouth at bedtime.     QUEtiapine (SEROQUEL) 100 MG tablet Take 100 mg by mouth at bedtime.     rOPINIRole (REQUIP) 0.5 MG tablet Take 0.5 mg by mouth at bedtime as needed (for restless legs).     sertraline (ZOLOFT) 50 MG tablet Take 100 mg by mouth daily.      Labs  Lab Results:     Latest Ref Rng & Units 09/03/2022    6:48 PM 07/29/2022    8:45 AM 05/28/2022   11:18 PM  CBC  WBC 4.0 - 10.5 K/uL 5.5  5.7  5.5   Hemoglobin 13.0 - 17.0 g/dL 06.2  37.6  28.3   Hematocrit 39.0 - 52.0 % 44.3  39.5  43.4   Platelets 150 - 400 K/uL 176  167  193       Latest Ref Rng & Units 09/04/2022    2:17 PM 09/03/2022    6:48 PM 07/29/2022    8:45 AM  CMP  Glucose 70 -  99 mg/dL  696  295   BUN 6 - 20 mg/dL  14  8   Creatinine 2.84 - 1.24 mg/dL  1.32  4.40   Sodium 102 - 145 mmol/L  136  135   Potassium 3.5 - 5.1 mmol/L 4.0  3.4   3.3   Chloride 98 - 111 mmol/L  103  103   CO2 22 - 32 mmol/L  24  23   Calcium 8.9 - 10.3 mg/dL  9.4  8.5   Total Protein 6.5 - 8.1 g/dL  7.5  7.3   Total Bilirubin 0.3 - 1.2 mg/dL  0.3  0.4   Alkaline Phos 38 - 126 U/L  59  46   AST 15 - 41 U/L  32  39   ALT 0 - 44 U/L  53  44     Blood Alcohol level:  Lab Results  Component Value Date   ETH <10 09/03/2022   ETH <10 07/29/2022   Metabolic Disorder Labs: Lab Results  Component Value Date   HGBA1C 7.3 (H) 09/04/2022   MPG 162.81 09/04/2022   MPG 145.59 05/21/2022   Lab Results  Component Value Date   PROLACTIN 31.3 (H) 05/23/2015   Lab Results  Component Value Date   CHOL 129 12/09/2020   TRIG 84 12/09/2020   HDL 29 (L) 12/09/2020   CHOLHDL 4.4 12/09/2020   VLDL 17 12/09/2020   LDLCALC 83 12/09/2020   LDLCALC 49 10/08/2020   Therapeutic Lab Levels: No results found for: "LITHIUM" Lab Results  Component Value Date   VALPROATE <10 (L) 09/04/2022   No results found for: "CBMZ" Physical Findings   AIMS    Flowsheet Row [facility]) [facility]-Ad[facility] (Discharged) [facility]) [facility]  AIMS Total Score 0 0 0      AUDIT    Flowsheet Row [facility]) [facility]Ad[facility] (Discharged) [facility]  Alcohol Use Disorder Identification Test Final Score (AUDIT) 1 1 6  0      GAD-7    Flowsheet Row Office Visit from 01/31/2018 in [facility] Office Visit from 12/13/2017 in [facility] Office Visit from 11/29/2017 in [facility]  Total GAD-7 Score 19 19 6       PHQ2-9    Flowsheet Row ED from 09/03/2022 in [facility] Office Visit from 03/14/2018 in [facility] Office Visit from 02/28/2018 in [facility] Office Visit from 02/14/2018 in [facility] Office Visit from 01/31/2018 in [facility]  PHQ-2 Total Score 0 2 3 6 4   PHQ-9 Total Score 5 15 18 23 17       Flowsheet Row ED from 09/03/2022 in [facility] Most recent reading at 09/04/2022  2:01 AM ED from 09/03/2022 in [facility] Emergency Department at [facility] Most recent reading at 09/03/2022  6:33 PM ED from 07/29/2022 in [facility] Emergency Department at [facility] Most recent reading at 07/29/2022  8:25 AM  C-SSRS RISK CATEGORY No Risk  High Risk High Risk       Musculoskeletal  Strength & Muscle Tone: within normal limits Gait & Station: normal Patient leans: N/A  Psychiatric Specialty Exam  Presentation  General Appearance: Appropriate for Environment; Well Groomed   Eye Contact:Good   Speech:Clear and Coherent; Normal Rate   Speech Volume:Normal   Handedness:Right   Mood and Affect  Mood:-- ("I feel fine - just sitting here")   Affect:Appropriate; Congruent   Thought Process  Thought Processes:Coherent; Linear; Goal Directed   Descriptions of Associations:Intact   Orientation:Full (Time, Place and Person)   Thought Content:Logical; WDL  Diagnosis of Schizophrenia or Schizoaffective disorder in past: No    Hallucinations:Hallucinations: None   Ideas of Reference:None   Suicidal Thoughts:Suicidal Thoughts: No SI Passive Intent and/or Plan: Without Intent; Without Plan   Homicidal Thoughts:Homicidal Thoughts: No   Sensorium  Memory:Immediate Good; Recent Good;  Remote Good   Judgment:Good   Insight:Good   Executive Functions  Concentration:Good   Attention Span:Good   Recall:Good   Fund of Knowledge:Good   Language:Good   Psychomotor Activity  Psychomotor Activity:Psychomotor Activity: Normal   Assets  Assets:Resilience   Sleep  Sleep:Sleep: Good   Physical Exam  Physical Exam Vitals and nursing note reviewed.  HENT:     Head: Normocephalic and atraumatic.  Pulmonary:     Effort: Pulmonary effort is normal.  Musculoskeletal:     Cervical back: Normal range of motion.  Neurological:     General: No focal deficit present.     Mental Status: He is alert. Mental status is at baseline.    Review of Systems  Constitutional: Negative.   Respiratory: Negative.    Cardiovascular: Negative.   Gastrointestinal: Negative.   Genitourinary: Negative.   Psychiatric/Behavioral:         Psychiatric subjective data addressed in PSE or HPI / daily subjective report   Blood pressure 115/76, pulse 60, temperature 98.2 F (36.8 C), temperature source Oral, resp. rate 16, SpO2 98%. There is no height or weight on file to calculate BMI.  Treatment Plan Summary: Daily contact with patient to assess and evaluate symptoms and progress in treatment and Medication management: -- continue home buprenorphine-naloxone 4-1 twice daily for opioid medication assisted therapy with goal to discontinue altogether -- continue home valproic acid 250 mg daily for mood stabilization  -- continue home quetiapine 100 mg daily at bedtime for sleep  -- continue home sertraline 100 mg daily for depressive symptoms  -- continue home prazosin 1 mg daily at bedtime for nightmares -- COWS protocol -- medical regimen: metformin -- Patient in need of nicotine replacement; nicotine polacrilex (gum) and nicotine patch 21 mg / 24 hours ordered. Smoking cessation encouraged  PRNs              -- continue ibuprofen 600 mg every 6 hours as needed for mild  to moderate pain, fever, and headaches              -- continue hydroxyzine 25 mg three times a day as needed for anxiety              -- continue bismuth subsalicylate 524 mg oral chewable tablet every 3 hours as needed for diarrhea / loose stools              -- continue senna 8.6 mg oral at bedtime and polyethylene glycol 17 g oral daily as needed for mild to moderate constipation              --  continue ondansetron 8 mg every 8 hours as needed for nausea or vomiting              -- continue aluminum-magnesium hydroxide + simethicone 30 mL every 4 hours as needed for heartburn or indigestion              -- continue melatonin 3 mg at bedtime as needed for insomnia  -- -- Opiate withdrawal supportive care: as needed loperamide, naproxen, dicyclomine, and baclofen  -- As needed agitation protocol in-place  Augusto Gamble, MD 09/10/22 8:59 AM

## 2022-09-10 NOTE — ED Notes (Signed)
Patient resting in bedroom. Respirations even and unlabored. No acute distress noted. Environment secured. Will continue to monitor for safety.

## 2022-09-10 NOTE — ED Notes (Signed)
Patient denies SI,HI,AVH. Night medication administered without difficulty to patient. Patient is cooperative and interacts well with staff. Patient has been resting well and shows no signs of opioid withdrawal. . Patients respiratory is even and unlabored. No distress noted. Patient sleeping or resting in bed at present. Patient stated no complaints at present. will continue to monitor for safety.

## 2022-09-10 NOTE — ED Notes (Signed)
 Patient resting in room. Respirations even and unlabored. No acute distress noted. Environment secured. Will continue to monitor for safety.

## 2022-09-10 NOTE — Care Management (Signed)
[facility] Care Management   Writer contacted [facility] and informed Marcelino Duster that the patient is now being tapered off Suboxone.  Marcelino Duster informed me that since the patient has [facility] they cannot accept him.   Writer informed Dr. Jodie Echevaria.  Writer will continue to seek other placements that will accept his insurance.

## 2022-09-10 NOTE — ED Notes (Signed)
Patient is sleeping. Respirations equal and unlabored, skin warm and dry. No change in assessment or acuity. Routine safety checks conducted according to facility protocol. Will continue to monitor for safety.   

## 2022-09-10 NOTE — Group Note (Signed)
  Group Topic: Social Support  Group Date: 09/10/2022 Start Time: 1015 End Time: 1045 Facilitators: Priscille Kluver, NT  Department: [facility]  Number of Participants: 2  Group Focus: personal responsibility Treatment Modality:  Solution-Focused Therapy Interventions utilized were support Purpose: express feelings  Name: Joshua Thomas Date of Birth: 06/09/1975  MR: [id number]    Level of Participation: minimal Quality of Participation: attentive Interactions with others: gave feedback Mood/Affect: positive  Cognition: insightful Progress: Minimal Plan: referral / recommendations  Patients Problems:  Patient Active Problem List   Diagnosis Date Noted   Polysubstance abuse (HCC) 09/03/2022   Substance induced mood disorder (HCC) 07/29/2022   Right inguinal hernia 05/24/2022   Abdominal adhesions 05/24/2022   Epidural abscess 10/08/2021   Diabetes mellitus (HCC) 10/08/2021   Bilateral low back pain with left-sided sciatica    Acute osteomyelitis (HCC) 07/04/2021   Opioid use disorder, severe, dependence (HCC) 12/15/2020   Hepatitis C 12/15/2020   MDD (major depressive disorder), recurrent severe, 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

## 2022-09-10 NOTE — ED Notes (Signed)
Received patient this PM. Patient in his bed sleeping/resting. Patient respirations are even and unlabored. Will continue to monitor for safety.

## 2022-09-10 NOTE — Group Note (Signed)
Group Topic: Trust and Honesty  Group Date: 09/10/2022 Start Time: 1545 End Time: 1615 Facilitators: Fortunato Curling, LPN; Prentice Docker, RN  Department: [facility]  Number of Participants: 2  Group Focus: feeling awareness/expression Treatment Modality:  Spiritual Interventions utilized were reminiscence Purpose: express feelings  Name: Joshua Thomas Date of Birth: Jun 17, 1975  MR: [id number]    Level of Participation: active Quality of Participation: attentive Interactions with others: gave feedback Mood/Affect: appropriate Triggers (if applicable): None Cognition: coherent/clear Progress: Gaining insight Response: Receptive Plan: patient will be encouraged to express feelings openly in group sessions.  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

## 2022-09-10 NOTE — ED Notes (Signed)
 Patient resting with eyes closed. Respirations even and unlabored. No acute distress noted. Environment secured. Will continue to monitor for safety.

## 2022-09-10 NOTE — ED Notes (Signed)
 Patient A&Ox4. Denies intent to harm self/others when asked. Denies A/VH. Patient denies any physical complaints when asked. No acute distress noted. Support and encouragement provided. Routine safety checks conducted according to facility protocol. Encouraged patient to notify staff if thoughts of harm toward self or others arise. Patient verbalized understanding and agreement. Will continue to monitor for safety.

## 2022-09-11 NOTE — ED Provider Notes (Signed)
Behavioral Health Progress Note  Date and Time: 09/11/2022 3:17 PM Name: Joshua Thomas MRN:  [id number]  Subjective: " I am feeling better on my current treatment, I hope to get to [facility] rehab center soon.''  Objective: 47 year old man who reports long history of polysubstance dependence but say his drug of choice is opiates. He is endorsing favorable response to his current treatment. He has not noticed any significant changes since his Suboxone dose was adjusted. He denies experiencing any cravings for any substances at this time.In addition, patient denies mood swings, delusions, psychosis and SI/HI.  Patient asks to speak to social worker to inquire if his options for residential placement have expanded given his plan to taper off Suboxone.  Patient does not endorse any side-effects they attribute to medications. Patient does not endorse any somatic complaints  Diagnosis:  Final diagnoses:  Opioid use disorder  Substance induced mood disorder (HCC)  Alcohol use disorder   Total Time spent with patient: 45 minutes   Past Psychiatric History: Bipolar Disorder, depression, ADHD, opioid use disorder, PTSD  Past Medical History: COPD, chronic back pain, degenerative disc disease, Hep C, hypertension, sciatica Family History: Father-alcohol abuse, cirrhosis, brother committed suicide Social History: 47 y/o male, homeless and unemployed  Appetite: Good  Current Medications: Current Facility-Administered Medications  Medication Dose Route Frequency Provider Last Rate Last Admin   alum & mag hydroxide-simeth (MAALOX/MYLANTA) 200-200-20 MG/5ML suspension 30 mL  30 mL Oral Q4H PRN Augusto Gamble, MD       baclofen (LIORESAL) tablet 5 mg  5 mg Oral Q8H PRN Augusto Gamble, MD   5 mg at 09/10/22 2218   bismuth subsalicylate (PEPTO BISMOL) chewable tablet 524 mg  524 mg Oral Q3H PRN Augusto Gamble, MD       buprenorphine-naloxone (SUBOXONE) 2-0.5 mg per SL tablet 1 tablet  1 tablet Sublingual BID  Augusto Gamble, MD       haloperidol (HALDOL) tablet 5 mg  5 mg Oral Q6H PRN Augusto Gamble, MD       And   LORazepam (ATIVAN) tablet 1 mg  1 mg Oral Q6H PRN Augusto Gamble, MD       And   diphenhydrAMINE (BENADRYL) capsule 25 mg  25 mg Oral Q6H PRN Augusto Gamble, MD       haloperidol lactate (HALDOL) injection 5 mg  5 mg Intramuscular Q6H PRN Augusto Gamble, MD       And   LORazepam (ATIVAN) injection 1 mg  1 mg Intravenous Q6H PRN Augusto Gamble, MD       And   diphenhydrAMINE (BENADRYL) injection 25 mg  25 mg Intramuscular Q6H PRN Augusto Gamble, MD       divalproex (DEPAKOTE) DR tablet 250 mg  250 mg Oral QHS Bobbitt, Shalon E, NP   250 mg at 09/10/22 2219   hydrOXYzine (ATARAX) tablet 25 mg  25 mg Oral TID PRN Augusto Gamble, MD   25 mg at 09/08/22 2122   ibuprofen (ADVIL) tablet 600 mg  600 mg Oral Q6H PRN Augusto Gamble, MD   600 mg at 09/10/22 1007   lidocaine (LIDODERM) 5 % 1 patch  1 patch Transdermal Daily Augusto Gamble, MD   1 patch at 09/11/22 0851   melatonin tablet 3 mg  3 mg Oral QHS PRN Augusto Gamble, MD   3 mg at 09/08/22 2122   metFORMIN (GLUCOPHAGE) tablet 250 mg  250 mg Oral BID WC Bobbitt, Shalon E, NP   250 mg at  09/11/22 0851   nicotine (NICODERM CQ - dosed in mg/24 hours) patch 21 mg  21 mg Transdermal Daily Carlyn Reichert, MD   21 mg at 09/11/22 0852   ondansetron (ZOFRAN) tablet 8 mg  8 mg Oral Q8H PRN Augusto Gamble, MD       polyethylene glycol (MIRALAX / GLYCOLAX) packet 17 g  17 g Oral Daily PRN Augusto Gamble, MD   17 g at 09/06/22 2154   prazosin (MINIPRESS) capsule 1 mg  1 mg Oral QHS Bobbitt, Shalon E, NP   1 mg at 09/10/22 2218   QUEtiapine (SEROQUEL) tablet 100 mg  100 mg Oral QHS Bobbitt, Shalon E, NP   100 mg at 09/10/22 2218   senna (SENOKOT) tablet 8.6 mg  1 tablet Oral QHS PRN Augusto Gamble, MD       sertraline (ZOLOFT) tablet 100 mg  100 mg Oral Daily Bobbitt, Shalon E, NP   100 mg at 09/11/22 7829   Current Outpatient Medications  Medication Sig Dispense Refill    Aspirin-Acetaminophen-Caffeine (GOODY HEADACHE PO) Take 1 packet by mouth daily as needed (for pain).     Buprenorphine HCl-Naloxone HCl (SUBOXONE) 8-2 MG FILM Place 1 Film under the tongue 2 (two) times daily.     Calcium Carbonate Antacid (TUMS PO) Take 2-3 tablets by mouth daily as needed (gerd).     cloNIDine (CATAPRES) 0.1 MG tablet Take 0.1 mg by mouth 2 (two) times daily.     cyclobenzaprine (FLEXERIL) 10 MG tablet Take 10 mg by mouth 2 (two) times daily as needed for muscle spasms.     divalproex (DEPAKOTE) 250 MG DR tablet Take 250 mg by mouth at bedtime.     hydrOXYzine (VISTARIL) 50 MG capsule Take 50 mg by mouth 2 (two) times daily as needed for anxiety.     ibuprofen (ADVIL) 200 MG tablet Take 800 mg by mouth every 6 (six) hours as needed for mild pain or headache.     Melatonin 10 MG TABS Take 10 mg by mouth at bedtime.     naloxone (NARCAN) nasal spray 4 mg/0.1 mL Take in case of overdose 1 each 0   prazosin (MINIPRESS) 1 MG capsule Take 1 mg by mouth at bedtime.     QUEtiapine (SEROQUEL) 100 MG tablet Take 100 mg by mouth at bedtime.     rOPINIRole (REQUIP) 0.5 MG tablet Take 0.5 mg by mouth at bedtime as needed (for restless legs).     sertraline (ZOLOFT) 50 MG tablet Take 100 mg by mouth daily.      Labs  Lab Results:     Latest Ref Rng & Units 09/03/2022    6:48 PM 07/29/2022    8:45 AM 05/28/2022   11:18 PM  CBC  WBC 4.0 - 10.5 K/uL 5.5  5.7  5.5   Hemoglobin 13.0 - 17.0 g/dL 56.2  13.0  86.5   Hematocrit 39.0 - 52.0 % 44.3  39.5  43.4   Platelets 150 - 400 K/uL 176  167  193       Latest Ref Rng & Units 09/04/2022    2:17 PM 09/03/2022    6:48 PM 07/29/2022    8:45 AM  CMP  Glucose 70 - 99 mg/dL  784  696   BUN 6 - 20 mg/dL  14  8   Creatinine 2.95 - 1.24 mg/dL  2.84  1.32   Sodium 440 - 145 mmol/L  136  135   Potassium 3.5 - 5.1  mmol/L 4.0  3.4  3.3   Chloride 98 - 111 mmol/L  103  103   CO2 22 - 32 mmol/L  24  23   Calcium 8.9 - 10.3 mg/dL  9.4  8.5   Total  Protein 6.5 - 8.1 g/dL  7.5  7.3   Total Bilirubin 0.3 - 1.2 mg/dL  0.3  0.4   Alkaline Phos 38 - 126 U/L  59  46   AST 15 - 41 U/L  32  39   ALT 0 - 44 U/L  53  44     Blood Alcohol level:  Lab Results  Component Value Date   ETH <10 09/03/2022   ETH <10 07/29/2022   Metabolic Disorder Labs: Lab Results  Component Value Date   HGBA1C 7.3 (H) 09/04/2022   MPG 162.81 09/04/2022   MPG 145.59 05/21/2022   Lab Results  Component Value Date   PROLACTIN 31.3 (H) 05/23/2015   Lab Results  Component Value Date   CHOL 129 12/09/2020   TRIG 84 12/09/2020   HDL 29 (L) 12/09/2020   CHOLHDL 4.4 12/09/2020   VLDL 17 12/09/2020   LDLCALC 83 12/09/2020   LDLCALC 49 10/08/2020   Therapeutic Lab Levels: No results found for: "LITHIUM" Lab Results  Component Value Date   VALPROATE <10 (L) 09/04/2022   No results found for: "CBMZ" Physical Findings   AIMS    Flowsheet Row [facility]) [facility]-Ad[facility] (Discharged) [facility]) [facility]  AIMS Total Score 0 0 0      AUDIT    Flowsheet Row [facility]) [facility]-Ad[facility] (Discharged) [facility] (Discharged[facility]  Alcohol Use Disorder Identification Test Final Score (AUDIT) 1 1 6  0      GAD-7    Flowsheet Row Office Visit from 01/31/2018 in [facility] Office Visit from 12/13/2017 in [facility] Office Visit from 11/29/2017 in [facility]  Total GAD-7 Score 19 19 6        PHQ2-9    Flowsheet Row ED from 09/03/2022 in [facility] Office Visit from 03/14/2018 in [facility] Office Visit from 02/28/2018 in [facility] Office Visit from 02/14/2018 in [facility] Office Visit from 01/31/2018 in [facility]  PHQ-2 Total Score 0 2 3 6 4   PHQ-9 Total Score 5 15 18 23 17       Flowsheet Row ED from 09/03/2022 in [facility] Most recent reading at 09/04/2022  2:01 AM ED from 09/03/2022 in [facility] Most recent reading at 09/03/2022  6:33 PM ED from 07/29/2022 in [facility] at [facility] Most recent reading at 07/29/2022  8:25 AM  C-SSRS RISK CATEGORY No Risk High Risk High Risk       Musculoskeletal  Strength & Muscle Tone: within normal limits Gait & Station: normal Patient leans: N/A  Psychiatric Specialty Exam  Presentation  General Appearance: Appropriate for Environment; Well Groomed   Eye Contact:Good  Speech:Clear and Coherent; Normal Rate   Speech Volume:Normal   Handedness:Right   Mood and Affect  Mood:-- ("I feel fine - just sitting here")   Affect:Appropriate; Congruent   Thought Process  Thought Processes:Coherent; Linear; Goal Directed   Descriptions of Associations:Intact   Orientation:Full (Time, Place and Person)   Thought Content:Logical; WDL  Diagnosis of Schizophrenia or Schizoaffective disorder in past: No    Hallucinations:Hallucinations: None   Ideas of Reference:None   Suicidal Thoughts:Suicidal Thoughts: No SI Passive Intent and/or Plan: Without Intent; Without Plan   Homicidal Thoughts:Homicidal Thoughts: No   Sensorium  Memory:Immediate Good; Recent Good; Remote Good   Judgment:Good   Insight:Good   Executive Functions  Concentration:Good   Attention  Span:Good   Recall:Good   Fund of Knowledge:Good   Language:Good   Psychomotor Activity  Psychomotor Activity:Psychomotor Activity: Normal   Assets  Assets:Resilience   Sleep  Sleep:Sleep: Good   Physical Exam  Physical Exam Vitals and nursing note reviewed.  HENT:     Head: Normocephalic and atraumatic.  Pulmonary:     Effort: Pulmonary effort is normal.  Musculoskeletal:     Cervical back: Normal range of motion.  Neurological:     General: No focal deficit present.     Mental Status: He is alert. Mental status is at baseline.    Review of Systems  Constitutional: Negative.   Respiratory: Negative.    Cardiovascular: Negative.   Gastrointestinal: Negative.   Genitourinary: Negative.   Psychiatric/Behavioral:         Psychiatric subjective data addressed in PSE or HPI / daily subjective report   Blood pressure 118/81, pulse (!) 56, temperature 97.6 F (36.4 C), temperature source Oral, resp. rate 16, SpO2 98%. There is no height or weight on file to calculate BMI.  Treatment Plan Summary: Daily contact with patient to assess and evaluate symptoms and progress in treatment and Medication management: -- continue home buprenorphine-naloxone 4-1 twice daily for opioid medication assisted therapy with goal to discontinue altogether -- continue home valproic acid 250 mg daily for mood stabilization  -- continue home quetiapine 100 mg daily at bedtime for sleep  -- continue home sertraline 100 mg daily for depressive symptoms  -- continue home prazosin 1 mg daily at bedtime for nightmares -- COWS protocol -- medical regimen: metformin -- Patient in need of nicotine replacement; nicotine polacrilex (gum) and nicotine patch 21 mg / 24 hours ordered. Smoking cessation encouraged  PRNs              -- continue ibuprofen 600 mg every 6 hours as needed for mild to moderate pain, fever, and headaches              -- continue hydroxyzine 25 mg three times a day as  needed for anxiety              -- continue bismuth subsalicylate 524 mg oral chewable tablet every 3 hours as needed for diarrhea / loose stools              -- continue senna 8.6 mg oral at bedtime and polyethylene glycol 17 g oral daily as needed for mild to moderate constipation              -- continue ondansetron 8 mg every 8 hours as needed for nausea or vomiting              -- continue aluminum-magnesium hydroxide + simethicone 30 mL every 4 hours as needed for  heartburn or indigestion              -- continue melatonin 3 mg at bedtime as needed for insomnia  -- -- Opiate withdrawal supportive care: as needed loperamide, naproxen, dicyclomine, and baclofen  -- As needed agitation protocol in-place  Thedore Mins, MD 09/11/22 3:17 PM Physical Exam Vitals and nursing note reviewed.  HENT:     Head: Normocephalic and atraumatic.  Pulmonary:     Effort: Pulmonary effort is normal.  Musculoskeletal:     Cervical back: Normal range of motion.  Neurological:     General: No focal deficit present.     Mental Status: He is alert. Mental status is at baseline.

## 2022-09-11 NOTE — Group Note (Signed)
Group Topic: Understanding Self  Group Date: 09/11/2022 Start Time: 1730 End Time: 1800 Facilitators: Dickie La, RN  Department: [facility]  Number of Participants: 3  Group Focus: relapse prevention and self-awareness Treatment Modality:  Psychoeducation Interventions utilized were patient education and reality testing Purpose: increase insight and reinforce self-care  Name: Joshua Thomas Date of Birth: 09-11-1975  MR: [id number]    Level of Participation: minimal Quality of Participation: attentive, cooperative, and engaged Interactions with others: gave feedback Mood/Affect: appropriate Triggers (if applicable): none Cognition: goal directed, insightful, and logical Progress: Gaining insight Response: Pt participated in group and gave a positive feedback. 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

## 2022-09-11 NOTE — Progress Notes (Signed)
Pt slept most of this shift but woke up for meals. Pt is presently watching TV in the dayroom. No distress noted or concerns voiced. Staff will monitor for pt's safety.

## 2022-09-11 NOTE — Group Note (Signed)
Group Topic: Relapse and Recovery  Group Date: 09/11/2022 Start Time: 1030 End Time: 1055 Facilitators: Priscille Kluver, NT  Department: [facility]  Number of Participants: 2  Group Focus: acceptance Treatment Modality:  Solution-Focused Therapy Interventions utilized were story telling Purpose: enhance coping skills  Name: Joshua Thomas Date of Birth: 1975-02-09  MR: [id number]    Level of Participation: Pt did not attend group  Patients Problems:  Patient Active Problem List   Diagnosis Date Noted   Polysubstance abuse (HCC) 09/03/2022   Substance induced mood disorder (HCC) 07/29/2022   Right inguinal hernia 05/24/2022   Abdominal adhesions 05/24/2022   Epidural abscess 10/08/2021   Diabetes mellitus (HCC) 10/08/2021   Bilateral low back pain with left-sided sciatica    Acute osteomyelitis (HCC) 07/04/2021   Opioid use disorder, severe, dependence (HCC) 12/15/2020   Hepatitis C 12/15/2020   MDD (major depressive disorder), recurrent severe, 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

## 2022-09-11 NOTE — Group Note (Signed)
Group Topic: Relapse and Recovery  Group Date: 09/11/2022 Start Time: 2000 End Time: 2045 Facilitators: Rae Lips B  Department: [facility]  Number of Participants: 3  Group Focus: activities of daily living skills Treatment Modality:  Leisure Development Interventions utilized were leisure development Purpose: express feelings  Name: Joshua Thomas Date of Birth: 1975/12/12  MR: [id number]    Level of Participation: active Quality of Participation: attentive Interactions with others: gave feedback Mood/Affect: appropriate Triggers (if applicable): NA Cognition: coherent/clear Progress: Gaining insight Response: NA Plan: patient will be encouraged to keep going to group  Patients Problems:  Patient Active Problem List   Diagnosis Date Noted   Polysubstance abuse (HCC) 09/03/2022   Substance induced mood disorder (HCC) 07/29/2022   Right inguinal hernia 05/24/2022   Abdominal adhesions 05/24/2022   Epidural abscess 10/08/2021   Diabetes mellitus (HCC) 10/08/2021   Bilateral low back pain with left-sided sciatica    Acute osteomyelitis (HCC) 07/04/2021   Opioid use disorder, severe, dependence (HCC) 12/15/2020   Hepatitis C 12/15/2020   MDD (major depressive disorder), recurrent severe, 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

## 2022-09-11 NOTE — Progress Notes (Signed)
Pt is presently asleep. Respirations are even and unlabored. No signs of acute distress noted. Administered scheduled meds per order. Pt was guarded in his response to whether he is suicidal or not. Pt stated "I think about it. They do not have a plan as to where I will go." Pt's SI appears to be determinant on whether a placement is found for him. Pt currently denies plan or intent and HI/AVH. Staff will monitor for pt's safety.

## 2022-09-11 NOTE — ED Notes (Signed)
Pt is currently sleeping, no distress noted, environmental check complete, will continue to monitor patient for safety.  

## 2022-09-11 NOTE — ED Notes (Signed)
Pt refused to have breakfast.

## 2022-09-12 NOTE — ED Notes (Signed)
Pt is currently sleeping, no distress noted, environmental check complete, will continue to monitor patient for safety.  

## 2022-09-12 NOTE — Group Note (Signed)
Group Topic: Social Support  Group Date: 09/12/2022 Start Time: 2030 End Time: 2100 Facilitators: Guss Bunde  Department: [facility]  Number of Participants: 4  Group Focus: community group Treatment Modality:  Leisure Development Interventions utilized were group exercise Purpose: express feelings  Name: Joshua Thomas Date of Birth: 03/12/1975  MR: [id number]    Level of Participation: moderate Quality of Participation: attention seeking and attentive Interactions with others: gave feedback Mood/Affect: appropriate Triggers (if applicable):  Cognition: coherent/clear Progress: Gaining insight Response:  Plan: patient will be encouraged to complete goals  Patients Problems:  Patient Active Problem List   Diagnosis Date Noted   Polysubstance abuse (HCC) 09/03/2022   Substance induced mood disorder (HCC) 07/29/2022   Right inguinal hernia 05/24/2022   Abdominal adhesions 05/24/2022   Epidural abscess 10/08/2021   Diabetes mellitus (HCC) 10/08/2021   Bilateral low back pain with left-sided sciatica    Acute osteomyelitis (HCC) 07/04/2021   Opioid use disorder, severe, dependence (HCC) 12/15/2020   Hepatitis C 12/15/2020   MDD (major depressive disorder), recurrent severe, 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

## 2022-09-12 NOTE — ED Provider Notes (Signed)
Behavioral Health Progress Note  Date and Time: 09/12/2022 9:55 AM Name: Joshua Thomas MRN:  [id number]  Subjective: "I am fairly normal." Sleep is good, last night I slept 7 hours. No nightmares. Appetite is okay, hungry but my mind does not want to eat. I am eating a little bit more. No SI. No HI. No auditory or visual hallucinations. He described his mood as being in his head, worried about if he will have somewhere to go once he leaves from here. He states that he came here to get substance abuse treatment but is having issues with his insurance. He denies withdrawal/cravings. He denies medical complaints. He denies medication side effects.  HPI: per chart review on 09/03/22: Naida Sleight is a 47 y/o male with a history of PTSD, depression, substance abuse, bipolar disorder, adhd, fentanyl use, methamphetamine use, cocaine use and stimulant use presenting to [facility] voluntarily and unaccompanied after presenting to [facility]. Patient states that he has been using fentanyl and he wants treatment to stop. Patient reports that he uses about 1/2 gram of fentanyl intravenously daily and about a pint of alcohol daily. Patient reports that he relapsed on fentanyl, crack cocaine, alcohol and methamphetamine after being sober for about a year.   Diagnosis:  Final diagnoses:  Opioid use disorder  Substance induced mood disorder (HCC)  Alcohol use disorder    Total Time spent with patient: 20 minutes  Past Psychiatric History: History of bipolar disorder, depression, ADHD, PTSD, polysubstance abuse, opiate use, methamphetamine use, alcohol use, cocaine use.   Past Medical History: History of COPD, chronic back pain, degenerative disk disease, hep C, hypertension, and sciatica.  Family Psychiatric  History: Father has a history of alcoholism, and brother committed suicide.  Social History: Homelessness, unemployed, and substance abuse.  Current Medications:  Current  Facility-Administered Medications  Medication Dose Route Frequency Provider Last Rate Last Admin   alum & mag hydroxide-simeth (MAALOX/MYLANTA) 200-200-20 MG/5ML suspension 30 mL  30 mL Oral Q4H PRN Augusto Gamble, MD       baclofen (LIORESAL) tablet 5 mg  5 mg Oral Q8H PRN Augusto Gamble, MD   5 mg at 09/10/22 2218   bismuth subsalicylate (PEPTO BISMOL) chewable tablet 524 mg  524 mg Oral Q3H PRN Augusto Gamble, MD       buprenorphine-naloxone (SUBOXONE) 2-0.5 mg per SL tablet 1 tablet  1 tablet Sublingual BID Augusto Gamble, MD   1 tablet at 09/12/22 8469   haloperidol (HALDOL) tablet 5 mg  5 mg Oral Q6H PRN Augusto Gamble, MD       And   LORazepam (ATIVAN) tablet 1 mg  1 mg Oral Q6H PRN Augusto Gamble, MD       And   diphenhydrAMINE (BENADRYL) capsule 25 mg  25 mg Oral Q6H PRN Augusto Gamble, MD       haloperidol lactate (HALDOL) injection 5 mg  5 mg Intramuscular Q6H PRN Augusto Gamble, MD       And   LORazepam (ATIVAN) injection 1 mg  1 mg Intravenous Q6H PRN Augusto Gamble, MD       And   diphenhydrAMINE (BENADRYL) injection 25 mg  25 mg Intramuscular Q6H PRN Augusto Gamble, MD       divalproex (DEPAKOTE) DR tablet 250 mg  250 mg Oral QHS Bobbitt, Shalon E, NP   250 mg at 09/11/22 2113   hydrOXYzine (ATARAX) tablet 25 mg  25 mg Oral TID PRN Augusto Gamble, MD   25  mg at 09/08/22 2122   ibuprofen (ADVIL) tablet 600 mg  600 mg Oral Q6H PRN Augusto Gamble, MD   600 mg at 09/11/22 2112   lidocaine (LIDODERM) 5 % 1 patch  1 patch Transdermal Daily Augusto Gamble, MD   1 patch at 09/11/22 0851   melatonin tablet 3 mg  3 mg Oral QHS PRN Augusto Gamble, MD   3 mg at 09/08/22 2122   metFORMIN (GLUCOPHAGE) tablet 250 mg  250 mg Oral BID WC Bobbitt, Shalon E, NP   250 mg at 09/12/22 9563   nicotine (NICODERM CQ - dosed in mg/24 hours) patch 21 mg  21 mg Transdermal Daily Carlyn Reichert, MD   21 mg at 09/12/22 0925   ondansetron (ZOFRAN) tablet 8 mg  8 mg Oral Q8H PRN Augusto Gamble, MD       polyethylene glycol (MIRALAX / GLYCOLAX) packet  17 g  17 g Oral Daily PRN Augusto Gamble, MD   17 g at 09/06/22 2154   prazosin (MINIPRESS) capsule 1 mg  1 mg Oral QHS Bobbitt, Shalon E, NP   1 mg at 09/11/22 2113   QUEtiapine (SEROQUEL) tablet 100 mg  100 mg Oral QHS Bobbitt, Shalon E, NP   100 mg at 09/11/22 2112   senna (SENOKOT) tablet 8.6 mg  1 tablet Oral QHS PRN Augusto Gamble, MD       sertraline (ZOLOFT) tablet 100 mg  100 mg Oral Daily Bobbitt, Shalon E, NP   100 mg at 09/12/22 8756   Current Outpatient Medications  Medication Sig Dispense Refill   Aspirin-Acetaminophen-Caffeine (GOODY HEADACHE PO) Take 1 packet by mouth daily as needed (for pain).     Buprenorphine HCl-Naloxone HCl (SUBOXONE) 8-2 MG FILM Place 1 Film under the tongue 2 (two) times daily.     Calcium Carbonate Antacid (TUMS PO) Take 2-3 tablets by mouth daily as needed (gerd).     cloNIDine (CATAPRES) 0.1 MG tablet Take 0.1 mg by mouth 2 (two) times daily.     cyclobenzaprine (FLEXERIL) 10 MG tablet Take 10 mg by mouth 2 (two) times daily as needed for muscle spasms.     divalproex (DEPAKOTE) 250 MG DR tablet Take 250 mg by mouth at bedtime.     hydrOXYzine (VISTARIL) 50 MG capsule Take 50 mg by mouth 2 (two) times daily as needed for anxiety.     ibuprofen (ADVIL) 200 MG tablet Take 800 mg by mouth every 6 (six) hours as needed for mild pain or headache.     Melatonin 10 MG TABS Take 10 mg by mouth at bedtime.     naloxone (NARCAN) nasal spray 4 mg/0.1 mL Take in case of overdose 1 each 0   prazosin (MINIPRESS) 1 MG capsule Take 1 mg by mouth at bedtime.     QUEtiapine (SEROQUEL) 100 MG tablet Take 100 mg by mouth at bedtime.     rOPINIRole (REQUIP) 0.5 MG tablet Take 0.5 mg by mouth at bedtime as needed (for restless legs).     sertraline (ZOLOFT) 50 MG tablet Take 100 mg by mouth daily.      Labs  Lab Results:  Admission on 09/03/2022  Component Date Value Ref Range Status   Valproic Acid Lvl 09/04/2022 <10 (L)  50.0 - 100.0 ug/mL Final   Comment: RESULT  CONFIRMED BY MANUAL DILUTION Performed at [facility], [address] [place], [place] [address]    Hgb A1c MFr Bld 09/04/2022 7.3 (H)  4.8 - 5.6 %  Final   Comment: (NOTE) Pre diabetes:          5.7%-6.4%  Diabetes:              >6.4%  Glycemic control for   <7.0% adults with diabetes    Mean Plasma Glucose 09/04/2022 162.81  mg/dL Final   Performed at [facility], [address] [place], [place] [address]   Potassium 09/04/2022 4.0  3.5 - 5.1 mmol/L Final   Performed at [facility], [address] [place], [place] [address]   POC Amphetamine UR 09/07/2022 None Detected  NONE DETECTED (Cut Off Level 1000 ng/mL) Final   POC Secobarbital (BAR) 09/07/2022 None Detected  NONE DETECTED (Cut Off Level 300 ng/mL) Final   POC Buprenorphine (BUP) 09/07/2022 Positive (A)  NONE DETECTED (Cut Off Level 10 ng/mL) Final   POC Oxazepam (BZO) 09/07/2022 None Detected  NONE DETECTED (Cut Off Level 300 ng/mL) Final   POC Cocaine UR 09/07/2022 None Detected  NONE DETECTED (Cut Off Level 300 ng/mL) Final   POC Methamphetamine UR 09/07/2022 None Detected  NONE DETECTED (Cut Off Level 1000 ng/mL) Final   POC Morphine 09/07/2022 None Detected  NONE DETECTED (Cut Off Level 300 ng/mL) Final   POC Methadone UR 09/07/2022 None Detected  NONE DETECTED (Cut Off Level 300 ng/mL) Final   POC Oxycodone UR 09/07/2022 None Detected  NONE DETECTED (Cut Off Level 100 ng/mL) Final   POC Marijuana UR 09/07/2022 None Detected  NONE DETECTED (Cut Off Level 50 ng/mL) Final  Admission on 09/03/2022, Discharged on 09/03/2022  Component Date Value Ref Range Status   WBC 09/03/2022 5.5  4.0 - 10.5 K/uL Final   RBC 09/03/2022 4.81  4.22 - 5.81 MIL/uL Final   Hemoglobin 09/03/2022 14.4  13.0 - 17.0 g/dL Final   HCT 09/81/1914 44.3  39.0 - 52.0 % Final   MCV 09/03/2022 92.1  80.0 - 100.0 fL Final   MCH 09/03/2022 29.9  26.0 - 34.0 pg Final   MCHC 09/03/2022 32.5  30.0 - 36.0 g/dL Final   RDW 78/29/5621  14.4  11.5 - 15.5 % Final   Platelets 09/03/2022 176  150 - 400 K/uL Final   nRBC 09/03/2022 0.0  0.0 - 0.2 % Final   Neutrophils Relative % 09/03/2022 63  % Final   Neutro Abs 09/03/2022 3.5  1.7 - 7.7 K/uL Final   Lymphocytes Relative 09/03/2022 30  % Final   Lymphs Abs 09/03/2022 1.7  0.7 - 4.0 K/uL Final   Monocytes Relative 09/03/2022 5  % Final   Monocytes Absolute 09/03/2022 0.3  0.1 - 1.0 K/uL Final   Eosinophils Relative 09/03/2022 2  % Final   Eosinophils Absolute 09/03/2022 0.1  0.0 - 0.5 K/uL Final   Basophils Relative 09/03/2022 0  % Final   Basophils Absolute 09/03/2022 0.0  0.0 - 0.1 K/uL Final   Immature Granulocytes 09/03/2022 0  % Final   Abs Immature Granulocytes 09/03/2022 0.02  0.00 - 0.07 K/uL Final   Performed at [facility], [address], [place], [place] [address]   Alcohol, Ethyl (B) 09/03/2022 <10  <10 mg/dL Final   Comment: (NOTE) Lowest detectable limit for serum alcohol is 10 mg/dL.  For medical purposes only. Performed at [facility], [address] [place], [place] [address]    Sodium 09/03/2022 136  135 - 145 mmol/L Final   Potassium 09/03/2022 3.4 (L)  3.5 - 5.1 mmol/L Final  Chloride 09/03/2022 103  98 - 111 mmol/L Final   CO2 09/03/2022 24  22 - 32 mmol/L Final   Glucose, Bld 09/03/2022 273 (H)  70 - 99 mg/dL Final   Glucose reference range applies only to samples taken after fasting for at least 8 hours.   BUN 09/03/2022 14  6 - 20 mg/dL Final   Creatinine, Ser 09/03/2022 0.83  0.61 - 1.24 mg/dL Final   Calcium 40/98/1191 9.4  8.9 - 10.3 mg/dL Final   Total Protein 47/82/9562 7.5  6.5 - 8.1 g/dL Final   Albumin 13/08/6576 3.9  3.5 - 5.0 g/dL Final   AST 46/96/2952 32  15 - 41 U/L Final   ALT 09/03/2022 53 (H)  0 - 44 U/L Final   Alkaline Phosphatase 09/03/2022 59  38 - 126 U/L Final   Total Bilirubin 09/03/2022 0.3  0.3 - 1.2 mg/dL Final   GFR, Estimated 09/03/2022 >60  >60 mL/min Final   Comment:  (NOTE) Calculated using the CKD-EPI Creatinine Equation (2021)    Anion gap 09/03/2022 9  5 - 15 Final   Performed at [facility], [address], [place], [place] [address]   Acetaminophen (Tylenol), Serum 09/03/2022 <10 (L)  10 - 30 ug/mL Final   Comment: (NOTE) Therapeutic concentrations vary significantly. A range of 10-30 ug/mL  may be an effective concentration for many patients. However, some  are best treated at concentrations outside of this range. Acetaminophen concentrations >150 ug/mL at 4 hours after ingestion  and >50 ug/mL at 12 hours after ingestion are often associated with  toxic reactions.  Performed at [facility], [address], [place], [place] [address]    Salicylate Lvl 09/03/2022 <7.0 (L)  7.0 - 30.0 mg/dL Final   Performed at [facility], [address], [place], [place] [address]   Opiates 09/03/2022 NONE DETECTED  NONE DETECTED Final   Cocaine 09/03/2022 POSITIVE (A)  NONE DETECTED Final   Benzodiazepines 09/03/2022 POSITIVE (A)  NONE DETECTED Final   Amphetamines 09/03/2022 POSITIVE (A)  NONE DETECTED Final   Tetrahydrocannabinol 09/03/2022 POSITIVE (A)  NONE DETECTED Final   Barbiturates 09/03/2022 NONE DETECTED  NONE DETECTED Final   Comment: (NOTE) DRUG SCREEN FOR MEDICAL PURPOSES ONLY.  IF CONFIRMATION IS NEEDED FOR ANY PURPOSE, NOTIFY LAB WITHIN 5 DAYS.  LOWEST DETECTABLE LIMITS FOR URINE DRUG SCREEN Drug Class                     Cutoff (ng/mL) Amphetamine and metabolites    1000 Barbiturate and metabolites    200 Benzodiazepine                 200 Opiates and metabolites        300 Cocaine and metabolites        300 THC                            50 Performed at [facility], [address] [place], [place] [address]   Admission on 07/29/2022, Discharged on 07/29/2022  Component Date Value Ref Range Status   Sodium 07/29/2022 135  135 - 145 mmol/L Final    Potassium 07/29/2022 3.3 (L)  3.5 - 5.1 mmol/L Final   Chloride 07/29/2022 103  98 - 111 mmol/L Final   CO2 07/29/2022 23  22 - 32 mmol/L Final   Glucose, Bld 07/29/2022 131 (H)  70 -  99 mg/dL Final   Glucose reference range applies only to samples taken after fasting for at least 8 hours.   BUN 07/29/2022 8  6 - 20 mg/dL Final   Creatinine, Ser 07/29/2022 0.82  0.61 - 1.24 mg/dL Final   Calcium 30/86/5784 8.5 (L)  8.9 - 10.3 mg/dL Final   Total Protein 69/62/9528 7.3  6.5 - 8.1 g/dL Final   Albumin 41/32/4401 3.6  3.5 - 5.0 g/dL Final   AST 02/72/5366 39  15 - 41 U/L Final   ALT 07/29/2022 44  0 - 44 U/L Final   Alkaline Phosphatase 07/29/2022 46  38 - 126 U/L Final   Total Bilirubin 07/29/2022 0.4  0.3 - 1.2 mg/dL Final   GFR, Estimated 07/29/2022 >60  >60 mL/min Final   Comment: (NOTE) Calculated using the CKD-EPI Creatinine Equation (2021)    Anion gap 07/29/2022 9  5 - 15 Final   Performed at [facility], [address], [place], [place] [address]   Alcohol, Ethyl (B) 07/29/2022 <10  <10 mg/dL Final   Comment: (NOTE) Lowest detectable limit for serum alcohol is 10 mg/dL.  For medical purposes only. Performed at [facility], [address] [place], [place] [address]    Salicylate Lvl 07/29/2022 <7.0 (L)  7.0 - 30.0 mg/dL Final   Performed at [facility], [address] [place], [place] [address]   Acetaminophen (Tylenol), Serum 07/29/2022 <10 (L)  10 - 30 ug/mL Final   Comment: (NOTE) Therapeutic concentrations vary significantly. A range of 10-30 ug/mL  may be an effective concentration for many patients. However, some  are best treated at concentrations outside of this range. Acetaminophen concentrations >150 ug/mL at 4 hours after ingestion  and >50 ug/mL at 12 hours after ingestion are often associated with  toxic reactions.  Performed at [facility], [address] [place], [place]  [address]    WBC 07/29/2022 5.7  4.0 - 10.5 K/uL Final   RBC 07/29/2022 4.40  4.22 - 5.81 MIL/uL Final   Hemoglobin 07/29/2022 13.3  13.0 - 17.0 g/dL Final   HCT 29/51/8841 39.5  39.0 - 52.0 % Final   MCV 07/29/2022 89.8  80.0 - 100.0 fL Final   MCH 07/29/2022 30.2  26.0 - 34.0 pg Final   MCHC 07/29/2022 33.7  30.0 - 36.0 g/dL Final   RDW 66/06/3014 14.0  11.5 - 15.5 % Final   Platelets 07/29/2022 167  150 - 400 K/uL Final   nRBC 07/29/2022 0.0  0.0 - 0.2 % Final   Performed at [facility], [address], [place], [place] [address]   Opiates 07/29/2022 NONE DETECTED  NONE DETECTED Final   Cocaine 07/29/2022 POSITIVE (A)  NONE DETECTED Final   Benzodiazepines 07/29/2022 POSITIVE (A)  NONE DETECTED Final   Amphetamines 07/29/2022 NONE DETECTED  NONE DETECTED Final   Tetrahydrocannabinol 07/29/2022 NONE DETECTED  NONE DETECTED Final   Barbiturates 07/29/2022 NONE DETECTED  NONE DETECTED Final   Comment: (NOTE) DRUG SCREEN FOR MEDICAL PURPOSES ONLY.  IF CONFIRMATION IS NEEDED FOR ANY PURPOSE, NOTIFY LAB WITHIN 5 DAYS.  LOWEST DETECTABLE LIMITS FOR URINE DRUG SCREEN Drug Class                     Cutoff (ng/mL) Amphetamine and metabolites    1000 Barbiturate and metabolites    200 Benzodiazepine  200 Opiates and metabolites        300 Cocaine and metabolites        300 THC                            50 Performed at [facility], [address], [place], [place] [address]   Admission on 05/28/2022, Discharged on 05/29/2022  Component Date Value Ref Range Status   Sodium 05/28/2022 137  135 - 145 mmol/L Final   Potassium 05/28/2022 3.8  3.5 - 5.1 mmol/L Final   Chloride 05/28/2022 103  98 - 111 mmol/L Final   CO2 05/28/2022 24  22 - 32 mmol/L Final   Glucose, Bld 05/28/2022 146 (H)  70 - 99 mg/dL Final   Glucose reference range applies only to samples taken after fasting for at least 8 hours.   BUN 05/28/2022 14  6 - 20 mg/dL  Final   Creatinine, Ser 05/28/2022 0.98  0.61 - 1.24 mg/dL Final   Calcium 62/13/0865 8.9  8.9 - 10.3 mg/dL Final   Total Protein 78/46/9629 7.6  6.5 - 8.1 g/dL Final   Albumin 52/84/1324 3.7  3.5 - 5.0 g/dL Final   AST 40/10/2723 20  15 - 41 U/L Final   ALT 05/28/2022 23  0 - 44 U/L Final   Alkaline Phosphatase 05/28/2022 37 (L)  38 - 126 U/L Final   Total Bilirubin 05/28/2022 0.4  0.3 - 1.2 mg/dL Final   GFR, Estimated 05/28/2022 >60  >60 mL/min Final   Comment: (NOTE) Calculated using the CKD-EPI Creatinine Equation (2021)    Anion gap 05/28/2022 10  5 - 15 Final   Performed at [facility], [address], [place], [place] [address]   Lipase 05/28/2022 22  11 - 51 U/L Final   Performed at [facility], [address] [place], [place] [address]   WBC 05/28/2022 5.5  4.0 - 10.5 K/uL Final   RBC 05/28/2022 4.73  4.22 - 5.81 MIL/uL Final   Hemoglobin 05/28/2022 14.1  13.0 - 17.0 g/dL Final   HCT 25/95/6387 43.4  39.0 - 52.0 % Final   MCV 05/28/2022 91.8  80.0 - 100.0 fL Final   MCH 05/28/2022 29.8  26.0 - 34.0 pg Final   MCHC 05/28/2022 32.5  30.0 - 36.0 g/dL Final   RDW 56/43/3295 14.0  11.5 - 15.5 % Final   Platelets 05/28/2022 193  150 - 400 K/uL Final   nRBC 05/28/2022 0.0  0.0 - 0.2 % Final   Neutrophils Relative % 05/28/2022 48  % Final   Neutro Abs 05/28/2022 2.7  1.7 - 7.7 K/uL Final   Lymphocytes Relative 05/28/2022 41  % Final   Lymphs Abs 05/28/2022 2.2  0.7 - 4.0 K/uL Final   Monocytes Relative 05/28/2022 8  % Final   Monocytes Absolute 05/28/2022 0.4  0.1 - 1.0 K/uL Final   Eosinophils Relative 05/28/2022 3  % Final   Eosinophils Absolute 05/28/2022 0.2  0.0 - 0.5 K/uL Final   Basophils Relative 05/28/2022 0  % Final   Basophils Absolute 05/28/2022 0.0  0.0 - 0.1 K/uL Final   Immature Granulocytes 05/28/2022 0  % Final   Abs Immature Granulocytes 05/28/2022 0.02  0.00 - 0.07 K/uL Final   Performed at [facility], [address]  [place], [place] [address]   Color, Urine 05/29/2022 STRAW (A)  YELLOW Final   APPearance  05/29/2022 CLEAR  CLEAR Final   Specific Gravity, Urine 05/29/2022 1.029  1.005 - 1.030 Final   pH 05/29/2022 6.0  5.0 - 8.0 Final   Glucose, UA 05/29/2022 NEGATIVE  NEGATIVE mg/dL Final   Hgb urine dipstick 05/29/2022 NEGATIVE  NEGATIVE Final   Bilirubin Urine 05/29/2022 NEGATIVE  NEGATIVE Final   Ketones, ur 05/29/2022 NEGATIVE  NEGATIVE mg/dL Final   Protein, ur 29/52/8413 NEGATIVE  NEGATIVE mg/dL Final   Nitrite 24/40/1027 NEGATIVE  NEGATIVE Final   Leukocytes,Ua 05/29/2022 NEGATIVE  NEGATIVE Final   RBC / HPF 05/29/2022 0-5  0 - 5 RBC/hpf Final   WBC, UA 05/29/2022 0-5  0 - 5 WBC/hpf Final   Bacteria, UA 05/29/2022 NONE SEEN  NONE SEEN Final   Squamous Epithelial / HPF 05/29/2022 0-5  0 - 5 /HPF Final   Performed at [facility], [address], [place], [place] [address]  Admission on 05/24/2022, Discharged on 05/24/2022  Component Date Value Ref Range Status   Glucose-Capillary 05/24/2022 151 (H)  70 - 99 mg/dL Final   Glucose reference range applies only to samples taken after fasting for at least 8 hours.   Glucose-Capillary 05/24/2022 175 (H)  70 - 99 mg/dL Final   Glucose reference range applies only to samples taken after fasting for at least 8 hours.  Hospital Outpatient Visit on 05/21/2022  Component Date Value Ref Range Status   WBC 05/21/2022 3.7 (L)  4.0 - 10.5 K/uL Final   RBC 05/21/2022 4.43  4.22 - 5.81 MIL/uL Final   Hemoglobin 05/21/2022 13.4  13.0 - 17.0 g/dL Final   HCT 44/03/4740 40.5  39.0 - 52.0 % Final   MCV 05/21/2022 91.4  80.0 - 100.0 fL Final   MCH 05/21/2022 30.2  26.0 - 34.0 pg Final   MCHC 05/21/2022 33.1  30.0 - 36.0 g/dL Final   RDW 59/56/3875 14.6  11.5 - 15.5 % Final   Platelets 05/21/2022 130 (L)  150 - 400 K/uL Final   nRBC 05/21/2022 0.0  0.0 - 0.2 % Final   Neutrophils Relative % 05/21/2022 44  % Final   Neutro Abs 05/21/2022 1.6 (L)  1.7 - 7.7 K/uL  Final   Lymphocytes Relative 05/21/2022 45  % Final   Lymphs Abs 05/21/2022 1.6  0.7 - 4.0 K/uL Final   Monocytes Relative 05/21/2022 6  % Final   Monocytes Absolute 05/21/2022 0.2  0.1 - 1.0 K/uL Final   Eosinophils Relative 05/21/2022 5  % Final   Eosinophils Absolute 05/21/2022 0.2  0.0 - 0.5 K/uL Final   Basophils Relative 05/21/2022 0  % Final   Basophils Absolute 05/21/2022 0.0  0.0 - 0.1 K/uL Final   Immature Granulocytes 05/21/2022 0  % Final   Abs Immature Granulocytes 05/21/2022 0.01  0.00 - 0.07 K/uL Final   Performed at [facility], [address], [place], [place] [address]   Hgb A1c MFr Bld 05/21/2022 6.7 (H)  4.8 - 5.6 % Final   Comment: (NOTE) Pre diabetes:          5.7%-6.4%  Diabetes:              >6.4%  Glycemic control for   <7.0% adults with diabetes    Mean Plasma Glucose 05/21/2022 145.59  mg/dL Final   Performed at [facility], [address], [place], [place] [address]   Sodium 05/21/2022 133 (L)  135 - 145 mmol/L Final   Potassium 05/21/2022 3.9  3.5 -  5.1 mmol/L Final   Chloride 05/21/2022 102  98 - 111 mmol/L Final   CO2 05/21/2022 22  22 - 32 mmol/L Final   Glucose, Bld 05/21/2022 242 (H)  70 - 99 mg/dL Final   Glucose reference range applies only to samples taken after fasting for at least 8 hours.   BUN 05/21/2022 22 (H)  6 - 20 mg/dL Final   Creatinine, Ser 05/21/2022 0.93  0.61 - 1.24 mg/dL Final   Calcium 78/29/5621 9.1  8.9 - 10.3 mg/dL Final   Total Protein 30/86/5784 7.3  6.5 - 8.1 g/dL Final   Albumin 69/62/9528 3.6  3.5 - 5.0 g/dL Final   AST 41/32/4401 20  15 - 41 U/L Final   ALT 05/21/2022 36  0 - 44 U/L Final   Alkaline Phosphatase 05/21/2022 41  38 - 126 U/L Final   Total Bilirubin 05/21/2022 0.8  0.3 - 1.2 mg/dL Final   GFR, Estimated 05/21/2022 >60  >60 mL/min Final   Comment: (NOTE) Calculated using the CKD-EPI Creatinine Equation (2021)    Anion gap 05/21/2022 9  5 - 15 Final   Performed at [facility], [address] [place], [place] [address]   Prothrombin Time 05/21/2022 13.7  11.4 - 15.2 seconds Final   INR 05/21/2022 1.0  0.8 - 1.2 Final   Comment: (NOTE) INR goal varies based on device and disease states. Performed at [facility], [address], [place], [place] [address]    Opiates 05/21/2022 NONE DETECTED  NONE DETECTED Final   Cocaine 05/21/2022 NONE DETECTED  NONE DETECTED Final   Benzodiazepines 05/21/2022 NONE DETECTED  NONE DETECTED Final   Amphetamines 05/21/2022 NONE DETECTED  NONE DETECTED Final   Tetrahydrocannabinol 05/21/2022 NONE DETECTED  NONE DETECTED Final   Barbiturates 05/21/2022 NONE DETECTED  NONE DETECTED Final   Comment: (NOTE) DRUG SCREEN FOR MEDICAL PURPOSES ONLY.  IF CONFIRMATION IS NEEDED FOR ANY PURPOSE, NOTIFY LAB WITHIN 5 DAYS.  LOWEST DETECTABLE LIMITS FOR URINE DRUG SCREEN Drug Class                     Cutoff (ng/mL) Amphetamine and metabolites    1000 Barbiturate and metabolites    200 Benzodiazepine                 200 Opiates and metabolites        300 Cocaine and metabolites        300 THC                            50 Performed at [facility], [address] [place], [place] [address]     Blood Alcohol level:  Lab Results  Component Value Date   [facility] <10 09/03/2022   ETH <10 07/29/2022    Metabolic Disorder Labs: Lab Results  Component Value Date   HGBA1C 7.3 (H) 09/04/2022   MPG 162.81 09/04/2022   MPG 145.59 05/21/2022   Lab Results  Component Value Date   PROLACTIN 31.3 (H) 05/23/2015   Lab Results  Component Value Date   CHOL 129 12/09/2020   TRIG 84 12/09/2020   HDL 29 (L) 12/09/2020   CHOLHDL 4.4 12/09/2020   VLDL 17 12/09/2020   LDLCALC 83 12/09/2020   LDLCALC 49 10/08/2020    Therapeutic Lab Levels: No results found for: "LITHIUM" Lab Results  Component Value Date   VALPROATE <10 (L) 09/04/2022   No results found  for: "CBMZ"  Physical Findings   AIMS    Flowsheet Row [facility] (Discharged) [facility]-Admission (Discharged[facility]  AIMS Total Score 0 0 0      AUDIT    Flowsheet Row [facility]charged) [facility] (Discharged[facility]-Admission (Discharged) from [facility]2020 [facility] [facility]) [facility]  Alcohol Use Disorder Identification Test Final Score (AUDIT) 1 1 6  0      GAD-7    Flowsheet Row Office Visit from 01/31/2018 in [facility] Office Visit from 12/13/2017 in [facility] Office Visit from 11/29/2017 in [facility]  Total GAD-7 Score 19 19 6       PHQ2-9    Flowsheet Row ED from 09/03/2022 in [facility] Office Visit from 03/14/2018 in [facility] Office Visit from 02/28/2018 in [facility] Office Visit from 02/14/2018 in [facility] Office Visit from 01/31/2018 in [facility]  PHQ-2 Total Score 0 2 3 6 4   PHQ-9 Total Score 5 15 18 23 17       Flowsheet Row ED from 09/03/2022 in [facility] Most recent reading at 09/04/2022  2:01 AM ED from 09/03/2022 in [facility] Emergency Department at [facility] Most recent reading at 09/03/2022  6:33 PM ED from 07/29/2022 in [facility] Emergency Department at [facility] Most recent reading at 07/29/2022  8:25 AM  C-SSRS RISK CATEGORY No Risk High Risk High Risk        Musculoskeletal  Strength & Muscle Tone: within normal limits Gait & Station: normal Patient  leans: N/A  Psychiatric Specialty Exam  Presentation  General Appearance:  Appropriate for Environment  Eye Contact: Fair  Speech: Clear and Coherent  Speech Volume: Normal  Handedness: Right   Mood and Affect  Mood: Anxious  Affect: Congruent   Thought Process  Thought Processes: Coherent  Descriptions of Associations:Intact  Orientation:Full (Time, Place and Person)  Thought Content:Logical  Diagnosis of Schizophrenia or Schizoaffective disorder in past: No    Hallucinations:Hallucinations: None  Ideas of Reference:None  Suicidal Thoughts:Suicidal Thoughts: No  Homicidal Thoughts:Homicidal Thoughts: No   Sensorium  Memory: Immediate Fair; Recent Fair; Remote Fair  Judgment: Fair  Insight: Fair   Art therapist  Concentration: Fair  Attention Span: Fair  Recall: [employer] of Knowledge: Fair  Language: Fair   Psychomotor Activity  Psychomotor Activity: Psychomotor Activity: Normal   Assets  Assets: Manufacturing systems engineer; Desire for Improvement; Financial Resources/Insurance; Leisure Time; Physical Health; Resilience   Sleep  Sleep: Sleep: Fair Number of Hours of Sleep: 7   Physical Exam  Physical Exam HENT:     Head: Normocephalic.     Nose: Nose normal.  Eyes:     Conjunctiva/sclera: Conjunctivae normal.  Cardiovascular:     Rate and Rhythm: Normal rate.  Pulmonary:     Effort: Pulmonary effort is normal.  Musculoskeletal:        General: Normal range of motion.     Cervical back: Normal range of motion.  Neurological:  Mental Status: He is alert and oriented to person, place, and time.    Review of Systems  Constitutional: Negative.   HENT: Negative.    Eyes: Negative.   Respiratory: Negative.    Cardiovascular: Negative.   Gastrointestinal: Negative.   Genitourinary: Negative.   Musculoskeletal: Negative.   Neurological: Negative.   Endo/Heme/Allergies: Negative.    Psychiatric/Behavioral:  Positive for substance abuse. The patient is nervous/anxious.    Blood pressure (!) 114/96, pulse 69, temperature 98.7 F (37.1 C), temperature source Tympanic, resp. rate 16, SpO2 99%. There is no height or weight on file to calculate BMI.  Treatment Plan Summary: Medication regimen: No medication changes made on 09/12/22. --continue Suboxone 1 tablet sublingual twice daily for opiate use dx --continue Depakote 250 mg nightly for mood dx.  --continue metformin 250 mg twice daily DM --continue sertraline 100 mg daily for mood disorder --continue Seroquel 100 mg p.o. for mood stabilization --continue Minipress 1 mg p.o. PTSD/nightmares  PRNs --continue melatonin 3 mg p.o. nightly as needed for sleep --continue maalox 30 ml every 4 hours prn for indigestion --continue baclofen 5 mg Q8H prn for muscle spasms --continue vistaril 25 mg TID prn for anxiety   Vital signs reviewed and are stable.   Labs reviewed: positive for amphetamines, benzos, THC, can cocaine. Valproic level <10 on 8/24  Disposition: pending, patient is seeking residential substance abuse treatment.    Layla Barter, NP 09/12/2022 9:55 AM

## 2022-09-12 NOTE — Group Note (Signed)
Group Topic: Healthy Self Image and Positive Change  Group Date: 09/12/2022 Start Time: 1115 End Time: 1210 Facilitators: Vonzell Schlatter B  Department: [facility]  Number of Participants: 3  Group Focus: daily focus Treatment Modality:  Psychoeducation Interventions utilized were group exercise and story telling Purpose: express feelings and regain self-worth  Name: Joshua Thomas Date of Birth: 02-23-1975  MR: [id number]    Level of Participation: active Quality of Participation: attentive and cooperative Interactions with others: gave feedback Mood/Affect: positive Triggers (if applicable): n/a Cognition: coherent/clear Progress: Moderate Response: n/a Plan: follow-up needed  Patients Problems:  Patient Active Problem List   Diagnosis Date Noted   Polysubstance abuse (HCC) 09/03/2022   Substance induced mood disorder (HCC) 07/29/2022   Right inguinal hernia 05/24/2022   Abdominal adhesions 05/24/2022   Epidural abscess 10/08/2021   Diabetes mellitus (HCC) 10/08/2021   Bilateral low back pain with left-sided sciatica    Acute osteomyelitis (HCC) 07/04/2021   Opioid use disorder, severe, dependence (HCC) 12/15/2020   Hepatitis C 12/15/2020   MDD (major depressive disorder), recurrent severe, 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

## 2022-09-12 NOTE — ED Notes (Signed)
Patient is A & O x 4. Appears angry and irritable. Wanted to return to room with Suboxone in mouth and when not allowed became defensive. Resident consumed breakfast and medication and returned to room. Denies SI, HI, AVH.

## 2022-09-12 NOTE — ED Notes (Signed)
Patient is in dayroom watching television, no distress noted, will continue to monitor patient for safety.

## 2022-09-12 NOTE — ED Notes (Signed)
Patient is resting quietly in bed. Responsive to verbal stimuli. Denies SI, HI, AVH. Patient attended group with minimal communication unless encouraged. Patient appears Guarded and isolative at this time.

## 2022-09-12 NOTE — Group Note (Signed)
Group Topic: Emotional Regulation  Group Date: 09/12/2022 Start Time: 1300 End Time: 1345 Facilitators: Mayer Camel L, RN  Department: [facility]  Number of Participants: 4  Group Focus: suicide prevention skills Treatment Modality:  Psychoeducation Interventions utilized were patient education Purpose: enhance coping skills  Name: Joshua Thomas Date of Birth: 04/03/1975  MR: [id number]    Level of Participation: when cued Quality of Participation: isolative Interactions with others: minimal, only participated when cued Mood/Affect: closed / guarded, depressed, flat, and irritable Triggers (if applicable): na Cognition: not focused Progress: Minimal Response: N/A Plan: follow-up needed  Patients Problems:  Patient Active Problem List   Diagnosis Date Noted   Polysubstance abuse (HCC) 09/03/2022   Substance induced mood disorder (HCC) 07/29/2022   Right inguinal hernia 05/24/2022   Abdominal adhesions 05/24/2022   Epidural abscess 10/08/2021   Diabetes mellitus (HCC) 10/08/2021   Bilateral low back pain with left-sided sciatica    Acute osteomyelitis (HCC) 07/04/2021   Opioid use disorder, severe, dependence (HCC) 12/15/2020   Hepatitis C 12/15/2020   MDD (major depressive disorder), recurrent severe, 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

## 2022-09-13 NOTE — ED Notes (Signed)
Pt is currently sleeping, no distress noted, environmental check complete, will continue to monitor patient for safety.  

## 2022-09-13 NOTE — Group Note (Signed)
Group Topic: Decisional Balance/Substance Abuse  Group Date: 09/13/2022 Start Time: 1000 End Time: 1053 Facilitators: Priscille Kluver, NT  Department: [facility]  Number of Participants: 3  Group Focus: abuse issues Treatment Modality:  Patient-Centered Therapy Interventions utilized were group exercise Purpose: express feelings  Name: Joshua Thomas Date of Birth: 11/08/1975  MR: [id number]    Level of Participation: minimal Quality of Participation: aggressive and attentive Interactions with others: Just listened Mood/Affect: appropriate Cognition: coherent/clear Progress: Minimal  Plan: N/A  Patients Problems:  Patient Active Problem List   Diagnosis Date Noted   Polysubstance abuse (HCC) 09/03/2022   Substance induced mood disorder (HCC) 07/29/2022   Right inguinal hernia 05/24/2022   Abdominal adhesions 05/24/2022   Epidural abscess 10/08/2021   Diabetes mellitus (HCC) 10/08/2021   Bilateral low back pain with left-sided sciatica    Acute osteomyelitis (HCC) 07/04/2021   Opioid use disorder, severe, dependence (HCC) 12/15/2020   Hepatitis C 12/15/2020   MDD (major depressive disorder), recurrent severe, 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

## 2022-09-13 NOTE — Group Note (Signed)
Group Topic: Communication  Group Date: 09/13/2022 Start Time: 2000 End Time: 2030 Facilitators: Rae Lips B  Department: [facility]  Number of Participants: 1  Group Focus: activities of daily living skills, chemical dependency education, clarity of thought, co-dependency, communication, and coping skills Treatment Modality:  Individual Therapy Interventions utilized were leisure development Purpose: enhance coping skills, express feelings, increase insight, and reinforce self-care  Name: Joshua Thomas Date of Birth: 06-11-1975  MR: [id number]    Level of Participation: Pt did not attend group Quality of Participation: NA Interactions with others: NA Mood/Affect: NA Triggers (if applicable): NA Cognition: NA Progress: Other Response: NA Plan: patient will be encouraged to go to groups.  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

## 2022-09-13 NOTE — Progress Notes (Signed)
Pt is awake, alert and oriented X4. Pt complained of back pain but declined intervention. No signs of acute distress noted. Administered scheduled meds per order. Pt was guarded in his response to SI assessment. Pt stated " it depends on whether they find me a place to go. I cannot go back on the street." Pt denies current HI/AVH. Staff will monitor for pt's safety.

## 2022-09-13 NOTE — ED Provider Notes (Signed)
Behavioral Health Progress Note  Date and Time: 09/13/2022 11:04 AM Name: Joshua Thomas MRN:  [id number]  Subjective: Patient seen this morning. He reports feeling anxious regarding his disposition planning. He states that he does not know he could go if he is unable to go to a residential facility after discharging from the hospital. Otherwise, he notes stable mood, noting good sleep and fair appetite. He feels the current medication regimen that he is on has been helpful for his depression and anxiety. He denies SI, HI, and AVH. I discussed that we are still working on finding placement for him and CSW will provide updates as they come.  HPI:  Joshua Thomas is a 47 y/o male with a history of PTSD, depression, substance abuse, bipolar disorder, adhd, fentanyl use, methamphetamine use, cocaine use and stimulant use presenting to [facility] voluntarily and unaccompanied after presenting to [facility]. Patient states that he has been using fentanyl and he wants treatment to stop. Patient reports that he uses about 1/2 gram of fentanyl intravenously daily and about a pint of alcohol daily. Patient reports that he relapsed on fentanyl, crack cocaine, alcohol and methamphetamine after being sober for about a year.   Diagnosis:  Final diagnoses:  Opioid use disorder  Substance induced mood disorder (HCC)  Alcohol use disorder    Total Time spent with patient: 20 minutes  Past Psychiatric History: History of bipolar disorder, depression, ADHD, PTSD, polysubstance abuse, opiate use, methamphetamine use, alcohol use, cocaine use.   Past Medical History: History of COPD, chronic back pain, degenerative disk disease, hep C, hypertension, and sciatica.  Family Psychiatric  History: Father has a history of alcoholism, and brother committed suicide.  Social History: Homelessness, unemployed, and substance abuse.  Current Medications:  Current Facility-Administered Medications  Medication Dose  Route Frequency Provider Last Rate Last Admin   alum & mag hydroxide-simeth (MAALOX/MYLANTA) 200-200-20 MG/5ML suspension 30 mL  30 mL Oral Q4H PRN Augusto Gamble, MD       baclofen (LIORESAL) tablet 5 mg  5 mg Oral Q8H PRN Augusto Gamble, MD   5 mg at 09/10/22 2218   bismuth subsalicylate (PEPTO BISMOL) chewable tablet 524 mg  524 mg Oral Q3H PRN Augusto Gamble, MD       buprenorphine-naloxone (SUBOXONE) 2-0.5 mg per SL tablet 1 tablet  1 tablet Sublingual BID Augusto Gamble, MD   1 tablet at 09/13/22 0858   haloperidol (HALDOL) tablet 5 mg  5 mg Oral Q6H PRN Augusto Gamble, MD       And   LORazepam (ATIVAN) tablet 1 mg  1 mg Oral Q6H PRN Augusto Gamble, MD       And   diphenhydrAMINE (BENADRYL) capsule 25 mg  25 mg Oral Q6H PRN Augusto Gamble, MD       haloperidol lactate (HALDOL) injection 5 mg  5 mg Intramuscular Q6H PRN Augusto Gamble, MD       And   LORazepam (ATIVAN) injection 1 mg  1 mg Intravenous Q6H PRN Augusto Gamble, MD       And   diphenhydrAMINE (BENADRYL) injection 25 mg  25 mg Intramuscular Q6H PRN Augusto Gamble, MD       divalproex (DEPAKOTE) DR tablet 250 mg  250 mg Oral QHS Bobbitt, Shalon E, NP   250 mg at 09/12/22 2141   hydrOXYzine (ATARAX) tablet 25 mg  25 mg Oral TID PRN Augusto Gamble, MD   25 mg at 09/12/22 1347   ibuprofen (ADVIL)  tablet 600 mg  600 mg Oral Q6H PRN Augusto Gamble, MD   600 mg at 09/11/22 2112   lidocaine (LIDODERM) 5 % 1 patch  1 patch Transdermal Daily Augusto Gamble, MD   1 patch at 09/13/22 0859   melatonin tablet 3 mg  3 mg Oral QHS PRN Augusto Gamble, MD   3 mg at 09/08/22 2122   metFORMIN (GLUCOPHAGE) tablet 250 mg  250 mg Oral BID WC Bobbitt, Shalon E, NP   250 mg at 09/13/22 0858   nicotine (NICODERM CQ - dosed in mg/24 hours) patch 21 mg  21 mg Transdermal Daily Carlyn Reichert, MD   21 mg at 09/13/22 0900   ondansetron (ZOFRAN) tablet 8 mg  8 mg Oral Q8H PRN Augusto Gamble, MD       polyethylene glycol (MIRALAX / GLYCOLAX) packet 17 g  17 g Oral Daily PRN Augusto Gamble, MD   17 g at  09/06/22 2154   prazosin (MINIPRESS) capsule 1 mg  1 mg Oral QHS Bobbitt, Shalon E, NP   1 mg at 09/12/22 2139   QUEtiapine (SEROQUEL) tablet 100 mg  100 mg Oral QHS Bobbitt, Shalon E, NP   100 mg at 09/12/22 2139   senna (SENOKOT) tablet 8.6 mg  1 tablet Oral QHS PRN Augusto Gamble, MD       sertraline (ZOLOFT) tablet 100 mg  100 mg Oral Daily Bobbitt, Shalon E, NP   100 mg at 09/13/22 6213   Current Outpatient Medications  Medication Sig Dispense Refill   Aspirin-Acetaminophen-Caffeine (GOODY HEADACHE PO) Take 1 packet by mouth daily as needed (for pain).     Buprenorphine HCl-Naloxone HCl (SUBOXONE) 8-2 MG FILM Place 1 Film under the tongue 2 (two) times daily.     Calcium Carbonate Antacid (TUMS PO) Take 2-3 tablets by mouth daily as needed (gerd).     cloNIDine (CATAPRES) 0.1 MG tablet Take 0.1 mg by mouth 2 (two) times daily.     cyclobenzaprine (FLEXERIL) 10 MG tablet Take 10 mg by mouth 2 (two) times daily as needed for muscle spasms.     divalproex (DEPAKOTE) 250 MG DR tablet Take 250 mg by mouth at bedtime.     hydrOXYzine (VISTARIL) 50 MG capsule Take 50 mg by mouth 2 (two) times daily as needed for anxiety.     ibuprofen (ADVIL) 200 MG tablet Take 800 mg by mouth every 6 (six) hours as needed for mild pain or headache.     Melatonin 10 MG TABS Take 10 mg by mouth at bedtime.     naloxone (NARCAN) nasal spray 4 mg/0.1 mL Take in case of overdose 1 each 0   prazosin (MINIPRESS) 1 MG capsule Take 1 mg by mouth at bedtime.     QUEtiapine (SEROQUEL) 100 MG tablet Take 100 mg by mouth at bedtime.     rOPINIRole (REQUIP) 0.5 MG tablet Take 0.5 mg by mouth at bedtime as needed (for restless legs).     sertraline (ZOLOFT) 50 MG tablet Take 100 mg by mouth daily.      Labs  Lab Results:  Admission on 09/03/2022  Component Date Value Ref Range Status   Valproic Acid Lvl 09/04/2022 <10 (L)  50.0 - 100.0 ug/mL Final   Comment: RESULT CONFIRMED BY MANUAL DILUTION Performed at [facility], [address] [place], [place] [address]    Hgb A1c MFr Bld 09/04/2022 7.3 (H)  4.8 - 5.6 % Final   Comment: (NOTE) Pre diabetes:  5.7%-6.4%  Diabetes:              >6.4%  Glycemic control for   <7.0% adults with diabetes    Mean Plasma Glucose 09/04/2022 162.81  mg/dL Final   Performed at [facility], [address] [place], [place] [address]   Potassium 09/04/2022 4.0  3.5 - 5.1 mmol/L Final   Performed at [facility], [address], [place], [place] [address]   POC Amphetamine UR 09/07/2022 None Detected  NONE DETECTED (Cut Off Level 1000 ng/mL) Final   POC Secobarbital (BAR) 09/07/2022 None Detected  NONE DETECTED (Cut Off Level 300 ng/mL) Final   POC Buprenorphine (BUP) 09/07/2022 Positive (A)  NONE DETECTED (Cut Off Level 10 ng/mL) Final   POC Oxazepam (BZO) 09/07/2022 None Detected  NONE DETECTED (Cut Off Level 300 ng/mL) Final   POC Cocaine UR 09/07/2022 None Detected  NONE DETECTED (Cut Off Level 300 ng/mL) Final   POC Methamphetamine UR 09/07/2022 None Detected  NONE DETECTED (Cut Off Level 1000 ng/mL) Final   POC Morphine 09/07/2022 None Detected  NONE DETECTED (Cut Off Level 300 ng/mL) Final   POC Methadone UR 09/07/2022 None Detected  NONE DETECTED (Cut Off Level 300 ng/mL) Final   POC Oxycodone UR 09/07/2022 None Detected  NONE DETECTED (Cut Off Level 100 ng/mL) Final   POC Marijuana UR 09/07/2022 None Detected  NONE DETECTED (Cut Off Level 50 ng/mL) Final  Admission on 09/03/2022, Discharged on 09/03/2022  Component Date Value Ref Range Status   WBC 09/03/2022 5.5  4.0 - 10.5 K/uL Final   RBC 09/03/2022 4.81  4.22 - 5.81 MIL/uL Final   Hemoglobin 09/03/2022 14.4  13.0 - 17.0 g/dL Final   HCT 25/95/6387 44.3  39.0 - 52.0 % Final   MCV 09/03/2022 92.1  80.0 - 100.0 fL Final   MCH 09/03/2022 29.9  26.0 - 34.0 pg Final   MCHC 09/03/2022 32.5  30.0 - 36.0 g/dL Final   RDW 56/43/3295 14.4  11.5 - 15.5 % Final   Platelets 09/03/2022 176   150 - 400 K/uL Final   nRBC 09/03/2022 0.0  0.0 - 0.2 % Final   Neutrophils Relative % 09/03/2022 63  % Final   Neutro Abs 09/03/2022 3.5  1.7 - 7.7 K/uL Final   Lymphocytes Relative 09/03/2022 30  % Final   Lymphs Abs 09/03/2022 1.7  0.7 - 4.0 K/uL Final   Monocytes Relative 09/03/2022 5  % Final   Monocytes Absolute 09/03/2022 0.3  0.1 - 1.0 K/uL Final   Eosinophils Relative 09/03/2022 2  % Final   Eosinophils Absolute 09/03/2022 0.1  0.0 - 0.5 K/uL Final   Basophils Relative 09/03/2022 0  % Final   Basophils Absolute 09/03/2022 0.0  0.0 - 0.1 K/uL Final   Immature Granulocytes 09/03/2022 0  % Final   Abs Immature Granulocytes 09/03/2022 0.02  0.00 - 0.07 K/uL Final   Performed at [facility], [address] [place], [place] [address]   Alcohol, Ethyl (B) 09/03/2022 <10  <10 mg/dL Final   Comment: (NOTE) Lowest detectable limit for serum alcohol is 10 mg/dL.  For medical purposes only. Performed at [facility], [address] [place], [place] [address]    Sodium 09/03/2022 136  135 - 145 mmol/L Final   Potassium 09/03/2022 3.4 (L)  3.5 - 5.1 mmol/L Final   Chloride 09/03/2022 103  98 - 111 mmol/L Final   CO2 09/03/2022 24  22 -  32 mmol/L Final   Glucose, Bld 09/03/2022 273 (H)  70 - 99 mg/dL Final   Glucose reference range applies only to samples taken after fasting for at least 8 hours.   BUN 09/03/2022 14  6 - 20 mg/dL Final   Creatinine, Ser 09/03/2022 0.83  0.61 - 1.24 mg/dL Final   Calcium 16/10/9602 9.4  8.9 - 10.3 mg/dL Final   Total Protein 54/09/8117 7.5  6.5 - 8.1 g/dL Final   Albumin 14/78/2956 3.9  3.5 - 5.0 g/dL Final   AST 21/30/8657 32  15 - 41 U/L Final   ALT 09/03/2022 53 (H)  0 - 44 U/L Final   Alkaline Phosphatase 09/03/2022 59  38 - 126 U/L Final   Total Bilirubin 09/03/2022 0.3  0.3 - 1.2 mg/dL Final   GFR, Estimated 09/03/2022 >60  >60 mL/min Final   Comment: (NOTE) Calculated using the CKD-EPI Creatinine  Equation (2021)    Anion gap 09/03/2022 9  5 - 15 Final   Performed at [facility], [address] [place], [place] [address]   Acetaminophen (Tylenol), Serum 09/03/2022 <10 (L)  10 - 30 ug/mL Final   Comment: (NOTE) Therapeutic concentrations vary significantly. A range of 10-30 ug/mL  may be an effective concentration for many patients. However, some  are best treated at concentrations outside of this range. Acetaminophen concentrations >150 ug/mL at 4 hours after ingestion  and >50 ug/mL at 12 hours after ingestion are often associated with  toxic reactions.  Performed at [facility], [address] , [place] [address]    Salicylate Lvl 09/03/2022 <7.0 (L)  7.0 - 30.0 mg/dL Final   Performed at [facility], [address] [place], [place] [address]   Opiates 09/03/2022 NONE DETECTED  NONE DETECTED Final   Cocaine 09/03/2022 POSITIVE (A)  NONE DETECTED Final   Benzodiazepines 09/03/2022 POSITIVE (A)  NONE DETECTED Final   Amphetamines 09/03/2022 POSITIVE (A)  NONE DETECTED Final   Tetrahydrocannabinol 09/03/2022 POSITIVE (A)  NONE DETECTED Final   Barbiturates 09/03/2022 NONE DETECTED  NONE DETECTED Final   Comment: (NOTE) DRUG SCREEN FOR MEDICAL PURPOSES ONLY.  IF CONFIRMATION IS NEEDED FOR ANY PURPOSE, NOTIFY LAB WITHIN 5 DAYS.  LOWEST DETECTABLE LIMITS FOR URINE DRUG SCREEN Drug Class                     Cutoff (ng/mL) Amphetamine and metabolites    1000 Barbiturate and metabolites    200 Benzodiazepine                 200 Opiates and metabolites        300 Cocaine and metabolites        300 THC                            50 Performed at [facility], [address], [place], [place] [address]   Admission on 07/29/2022, Discharged on 07/29/2022  Component Date Value Ref Range Status   Sodium 07/29/2022 135  135 - 145 mmol/L Final   Potassium 07/29/2022 3.3 (L)  3.5 - 5.1 mmol/L  Final   Chloride 07/29/2022 103  98 - 111 mmol/L Final   CO2 07/29/2022 23  22 - 32 mmol/L Final   Glucose, Bld 07/29/2022 131 (H)  70 - 99 mg/dL Final   Glucose reference range applies only to samples taken after fasting for  at least 8 hours.   BUN 07/29/2022 8  6 - 20 mg/dL Final   Creatinine, Ser 07/29/2022 0.82  0.61 - 1.24 mg/dL Final   Calcium 84/13/2440 8.5 (L)  8.9 - 10.3 mg/dL Final   Total Protein 11/07/2534 7.3  6.5 - 8.1 g/dL Final   Albumin 64/40/3474 3.6  3.5 - 5.0 g/dL Final   AST 25/95/6387 39  15 - 41 U/L Final   ALT 07/29/2022 44  0 - 44 U/L Final   Alkaline Phosphatase 07/29/2022 46  38 - 126 U/L Final   Total Bilirubin 07/29/2022 0.4  0.3 - 1.2 mg/dL Final   GFR, Estimated 07/29/2022 >60  >60 mL/min Final   Comment: (NOTE) Calculated using the CKD-EPI Creatinine Equation (2021)    Anion gap 07/29/2022 9  5 - 15 Final   Performed at [facility], [address], [place], [place] [address]   Alcohol, Ethyl (B) 07/29/2022 <10  <10 mg/dL Final   Comment: (NOTE) Lowest detectable limit for serum alcohol is 10 mg/dL.  For medical purposes only. Performed at [facility], [address], [place], [place] [address]    Salicylate Lvl 07/29/2022 <7.0 (L)  7.0 - 30.0 mg/dL Final   Performed at [facility], [address], [place], [place] [address]   Acetaminophen (Tylenol), Serum 07/29/2022 <10 (L)  10 - 30 ug/mL Final   Comment: (NOTE) Therapeutic concentrations vary significantly. A range of 10-30 ug/mL  may be an effective concentration for many patients. However, some  are best treated at concentrations outside of this range. Acetaminophen concentrations >150 ug/mL at 4 hours after ingestion  and >50 ug/mL at 12 hours after ingestion are often associated with  toxic reactions.  Performed at [facility], [address], [place], [place] [address]    WBC 07/29/2022 5.7  4.0 - 10.5 K/uL  Final   RBC 07/29/2022 4.40  4.22 - 5.81 MIL/uL Final   Hemoglobin 07/29/2022 13.3  13.0 - 17.0 g/dL Final   HCT 60/10/9321 39.5  39.0 - 52.0 % Final   MCV 07/29/2022 89.8  80.0 - 100.0 fL Final   MCH 07/29/2022 30.2  26.0 - 34.0 pg Final   MCHC 07/29/2022 33.7  30.0 - 36.0 g/dL Final   RDW 55/73/2202 14.0  11.5 - 15.5 % Final   Platelets 07/29/2022 167  150 - 400 K/uL Final   nRBC 07/29/2022 0.0  0.0 - 0.2 % Final   Performed at [facility], [address] [place], [place] [address]   Opiates 07/29/2022 NONE DETECTED  NONE DETECTED Final   Cocaine 07/29/2022 POSITIVE (A)  NONE DETECTED Final   Benzodiazepines 07/29/2022 POSITIVE (A)  NONE DETECTED Final   Amphetamines 07/29/2022 NONE DETECTED  NONE DETECTED Final   Tetrahydrocannabinol 07/29/2022 NONE DETECTED  NONE DETECTED Final   Barbiturates 07/29/2022 NONE DETECTED  NONE DETECTED Final   Comment: (NOTE) DRUG SCREEN FOR MEDICAL PURPOSES ONLY.  IF CONFIRMATION IS NEEDED FOR ANY PURPOSE, NOTIFY LAB WITHIN 5 DAYS.  LOWEST DETECTABLE LIMITS FOR URINE DRUG SCREEN Drug Class                     Cutoff (ng/mL) Amphetamine and metabolites    1000 Barbiturate and metabolites    200 Benzodiazepine                 200 Opiates and metabolites        300 Cocaine  and metabolites        300 THC                            50 Performed at [facility], [address] [place], [place] [address]   Admission on 05/28/2022, Discharged on 05/29/2022  Component Date Value Ref Range Status   Sodium 05/28/2022 137  135 - 145 mmol/L Final   Potassium 05/28/2022 3.8  3.5 - 5.1 mmol/L Final   Chloride 05/28/2022 103  98 - 111 mmol/L Final   CO2 05/28/2022 24  22 - 32 mmol/L Final   Glucose, Bld 05/28/2022 146 (H)  70 - 99 mg/dL Final   Glucose reference range applies only to samples taken after fasting for at least 8 hours.   BUN 05/28/2022 14  6 - 20 mg/dL Final   Creatinine, Ser 05/28/2022 0.98  0.61 -  1.24 mg/dL Final   Calcium 60/45/4098 8.9  8.9 - 10.3 mg/dL Final   Total Protein 11/91/4782 7.6  6.5 - 8.1 g/dL Final   Albumin 95/62/1308 3.7  3.5 - 5.0 g/dL Final   AST 65/78/4696 20  15 - 41 U/L Final   ALT 05/28/2022 23  0 - 44 U/L Final   Alkaline Phosphatase 05/28/2022 37 (L)  38 - 126 U/L Final   Total Bilirubin 05/28/2022 0.4  0.3 - 1.2 mg/dL Final   GFR, Estimated 05/28/2022 >60  >60 mL/min Final   Comment: (NOTE) Calculated using the CKD-EPI Creatinine Equation (2021)    Anion gap 05/28/2022 10  5 - 15 Final   Performed at [facility], [address], [place], [place] [address]   Lipase 05/28/2022 22  11 - 51 U/L Final   Performed at [facility], [address], [place], [place] [address]   WBC 05/28/2022 5.5  4.0 - 10.5 K/uL Final   RBC 05/28/2022 4.73  4.22 - 5.81 MIL/uL Final   Hemoglobin 05/28/2022 14.1  13.0 - 17.0 g/dL Final   HCT 40/10/2723 43.4  39.0 - 52.0 % Final   MCV 05/28/2022 91.8  80.0 - 100.0 fL Final   MCH 05/28/2022 29.8  26.0 - 34.0 pg Final   MCHC 05/28/2022 32.5  30.0 - 36.0 g/dL Final   RDW 36/64/4034 14.0  11.5 - 15.5 % Final   Platelets 05/28/2022 193  150 - 400 K/uL Final   nRBC 05/28/2022 0.0  0.0 - 0.2 % Final   Neutrophils Relative % 05/28/2022 48  % Final   Neutro Abs 05/28/2022 2.7  1.7 - 7.7 K/uL Final   Lymphocytes Relative 05/28/2022 41  % Final   Lymphs Abs 05/28/2022 2.2  0.7 - 4.0 K/uL Final   Monocytes Relative 05/28/2022 8  % Final   Monocytes Absolute 05/28/2022 0.4  0.1 - 1.0 K/uL Final   Eosinophils Relative 05/28/2022 3  % Final   Eosinophils Absolute 05/28/2022 0.2  0.0 - 0.5 K/uL Final   Basophils Relative 05/28/2022 0  % Final   Basophils Absolute 05/28/2022 0.0  0.0 - 0.1 K/uL Final   Immature Granulocytes 05/28/2022 0  % Final   Abs Immature Granulocytes 05/28/2022 0.02  0.00 - 0.07 K/uL Final   Performed at [facility], [address], [place], [place] [address]   Color, Urine 05/29/2022 STRAW (A)   YELLOW Final   APPearance 05/29/2022 CLEAR  CLEAR Final   Specific Gravity, Urine 05/29/2022 1.029  1.005 - 1.030 Final   pH 05/29/2022 6.0  5.0 - 8.0 Final   Glucose, UA 05/29/2022 NEGATIVE  NEGATIVE mg/dL Final   Hgb urine dipstick 05/29/2022 NEGATIVE  NEGATIVE Final   Bilirubin Urine 05/29/2022 NEGATIVE  NEGATIVE Final   Ketones, ur 05/29/2022 NEGATIVE  NEGATIVE mg/dL Final   Protein, ur 96/29/5284 NEGATIVE  NEGATIVE mg/dL Final   Nitrite 13/24/4010 NEGATIVE  NEGATIVE Final   Leukocytes,Ua 05/29/2022 NEGATIVE  NEGATIVE Final   RBC / HPF 05/29/2022 0-5  0 - 5 RBC/hpf Final   WBC, UA 05/29/2022 0-5  0 - 5 WBC/hpf Final   Bacteria, UA 05/29/2022 NONE SEEN  NONE SEEN Final   Squamous Epithelial / HPF 05/29/2022 0-5  0 - 5 /HPF Final   Performed at [facility], [address] [place], [place] [address]  Admission on 05/24/2022, Discharged on 05/24/2022  Component Date Value Ref Range Status   Glucose-Capillary 05/24/2022 151 (H)  70 - 99 mg/dL Final   Glucose reference range applies only to samples taken after fasting for at least 8 hours.   Glucose-Capillary 05/24/2022 175 (H)  70 - 99 mg/dL Final   Glucose reference range applies only to samples taken after fasting for at least 8 hours.  Hospital Outpatient Visit on 05/21/2022  Component Date Value Ref Range Status   WBC 05/21/2022 3.7 (L)  4.0 - 10.5 K/uL Final   RBC 05/21/2022 4.43  4.22 - 5.81 MIL/uL Final   Hemoglobin 05/21/2022 13.4  13.0 - 17.0 g/dL Final   HCT 66/44/0347 40.5  39.0 - 52.0 % Final   MCV 05/21/2022 91.4  80.0 - 100.0 fL Final   MCH 05/21/2022 30.2  26.0 - 34.0 pg Final   MCHC 05/21/2022 33.1  30.0 - 36.0 g/dL Final   RDW 42/59/5638 14.6  11.5 - 15.5 % Final   Platelets 05/21/2022 130 (L)  150 - 400 K/uL Final   nRBC 05/21/2022 0.0  0.0 - 0.2 % Final   Neutrophils Relative % 05/21/2022 44  % Final   Neutro Abs 05/21/2022 1.6 (L)  1.7 - 7.7 K/uL Final   Lymphocytes Relative 05/21/2022 45  % Final    Lymphs Abs 05/21/2022 1.6  0.7 - 4.0 K/uL Final   Monocytes Relative 05/21/2022 6  % Final   Monocytes Absolute 05/21/2022 0.2  0.1 - 1.0 K/uL Final   Eosinophils Relative 05/21/2022 5  % Final   Eosinophils Absolute 05/21/2022 0.2  0.0 - 0.5 K/uL Final   Basophils Relative 05/21/2022 0  % Final   Basophils Absolute 05/21/2022 0.0  0.0 - 0.1 K/uL Final   Immature Granulocytes 05/21/2022 0  % Final   Abs Immature Granulocytes 05/21/2022 0.01  0.00 - 0.07 K/uL Final   Performed at [facility], [address] [place], [place] [address]   Hgb A1c MFr Bld 05/21/2022 6.7 (H)  4.8 - 5.6 % Final   Comment: (NOTE) Pre diabetes:          5.7%-6.4%  Diabetes:              >6.4%  Glycemic control for   <7.0% adults with diabetes    Mean Plasma Glucose 05/21/2022 145.59  mg/dL Final   Performed at [facility], [address] [place], [place] [address]   Sodium 05/21/2022 133 (L)  135 - 145 mmol/L Final   Potassium 05/21/2022 3.9  3.5 - 5.1 mmol/L Final   Chloride 05/21/2022 102  98 - 111 mmol/L  Final   CO2 05/21/2022 22  22 - 32 mmol/L Final   Glucose, Bld 05/21/2022 242 (H)  70 - 99 mg/dL Final   Glucose reference range applies only to samples taken after fasting for at least 8 hours.   BUN 05/21/2022 22 (H)  6 - 20 mg/dL Final   Creatinine, Ser 05/21/2022 0.93  0.61 - 1.24 mg/dL Final   Calcium 01/13/7251 9.1  8.9 - 10.3 mg/dL Final   Total Protein 66/44/0347 7.3  6.5 - 8.1 g/dL Final   Albumin 42/59/5638 3.6  3.5 - 5.0 g/dL Final   AST 75/64/3329 20  15 - 41 U/L Final   ALT 05/21/2022 36  0 - 44 U/L Final   Alkaline Phosphatase 05/21/2022 41  38 - 126 U/L Final   Total Bilirubin 05/21/2022 0.8  0.3 - 1.2 mg/dL Final   GFR, Estimated 05/21/2022 >60  >60 mL/min Final   Comment: (NOTE) Calculated using the CKD-EPI Creatinine Equation (2021)    Anion gap 05/21/2022 9  5 - 15 Final   Performed at [facility], [address], [place], [place] [address]   Prothrombin Time 05/21/2022  13.7  11.4 - 15.2 seconds Final   INR 05/21/2022 1.0  0.8 - 1.2 Final   Comment: (NOTE) INR goal varies based on device and disease states. Performed at [facility], [address], [place], [place] [address]    Opiates 05/21/2022 NONE DETECTED  NONE DETECTED Final   Cocaine 05/21/2022 NONE DETECTED  NONE DETECTED Final   Benzodiazepines 05/21/2022 NONE DETECTED  NONE DETECTED Final   Amphetamines 05/21/2022 NONE DETECTED  NONE DETECTED Final   Tetrahydrocannabinol 05/21/2022 NONE DETECTED  NONE DETECTED Final   Barbiturates 05/21/2022 NONE DETECTED  NONE DETECTED Final   Comment: (NOTE) DRUG SCREEN FOR MEDICAL PURPOSES ONLY.  IF CONFIRMATION IS NEEDED FOR ANY PURPOSE, NOTIFY LAB WITHIN 5 DAYS.  LOWEST DETECTABLE LIMITS FOR URINE DRUG SCREEN Drug Class                     Cutoff (ng/mL) Amphetamine and metabolites    1000 Barbiturate and metabolites    200 Benzodiazepine                 200 Opiates and metabolites        300 Cocaine and metabolites        300 THC                            50 Performed at [facility], [address] [place], [place] [address]     Blood Alcohol level:  Lab Results  Component Value Date   [facility] <10 09/03/2022   ETH <10 07/29/2022    Metabolic Disorder Labs: Lab Results  Component Value Date   HGBA1C 7.3 (H) 09/04/2022   MPG 162.81 09/04/2022   MPG 145.59 05/21/2022   Lab Results  Component Value Date   PROLACTIN 31.3 (H) 05/23/2015   Lab Results  Component Value Date   CHOL 129 12/09/2020   TRIG 84 12/09/2020   HDL 29 (L) 12/09/2020   CHOLHDL 4.4 12/09/2020   VLDL 17 12/09/2020   LDLCALC 83 12/09/2020   LDLCALC 49 10/08/2020    Therapeutic Lab Levels: No results found for: "LITHIUM" Lab Results  Component Value Date   VALPROATE <10 (L) 09/04/2022   No results found for: "CBMZ"  Physical Findings   AIMS    Flowsheet Row  [facility] (Discharged[facility] ([facility]) [facility]  AIMS Total Score 0 0 0      AUDIT    Flowsheet Row [facility]) [facility] Ad[facility] (Discharged) from 09/24/2020 in [facility] to Hosp-Admission (Discharged) from [facility] (Discharged) from [facility]  Alcohol Use Disorder Identification Test Final Score (AUDIT) 1 1 6  0      GAD-7    Flowsheet Row Office Visit from 01/31/2018 in [facility] Office Visit from 12/13/2017 in [facility] Office Visit from 11/29/2017 in [facility]  Total GAD-7 Score 19 19 6       PHQ2-9    Flowsheet Row ED from 09/03/2022 in [facility] Office Visit from 03/14/2018 in [facility] Office Visit from 02/28/2018 in [facility] Office Visit from 02/14/2018 in [facility] Office Visit from 01/31/2018 in [facility]  PHQ-2 Total Score 0 2 3 6 4   PHQ-9 Total Score 5 15 18 23 17       Flowsheet Row ED from 09/03/2022 in [facility] Most recent reading at 09/04/2022  2:01 AM ED from 09/03/2022 in [facility] Emergency Department at [facility] Most recent reading at 09/03/2022  6:33 PM ED from 07/29/2022 in [facility] Emergency Department at [facility] Most recent reading at 07/29/2022  8:25 AM  C-SSRS RISK CATEGORY No Risk High Risk High Risk        Musculoskeletal  Strength & Muscle Tone: within normal limits Gait & Station: normal Patient leans: N/A  Psychiatric Specialty Exam   Presentation  General Appearance:  Appropriate for Environment  Eye Contact: Good  Speech: Clear and Coherent  Speech Volume: Normal  Handedness: Right   Mood and Affect  Mood: Anxious  Affect: Congruent; Appropriate   Thought Process  Thought Processes: Coherent  Descriptions of Associations:Intact  Orientation:Full (Time, Place and Person)  Thought Content:Logical  Diagnosis of Schizophrenia or Schizoaffective disorder in past: No    Hallucinations:Hallucinations: None  Ideas of Reference:None  Suicidal Thoughts:Suicidal Thoughts: No  Homicidal Thoughts:Homicidal Thoughts: No   Sensorium  Memory: Immediate Good; Recent Good; Remote Good  Judgment: Fair  Insight: Fair   Chartered certified accountant: Fair  Attention Span: Fair  Recall: [employer] of Knowledge: Fair  Language: Fair   Psychomotor Activity  Psychomotor Activity: Psychomotor Activity: Normal   Assets  Assets: Communication Skills; Desire for Improvement; Financial Resources/Insurance; Resilience   Sleep  Sleep: Sleep: Good Number of Hours of Sleep: 7   Physical Exam  Physical Exam HENT:     Head: Normocephalic.     Nose: Nose normal.  Eyes:     Conjunctiva/sclera: Conjunctivae normal.  Cardiovascular:     Rate and Rhythm: Normal rate.  Pulmonary:     Effort: Pulmonary effort is normal.  Musculoskeletal:        General: Normal range of motion.     Cervical back: Normal range of motion.  Neurological:     Mental Status: He is alert and oriented to person, place, and time.  Review of Systems  Constitutional: Negative.   HENT: Negative.    Eyes: Negative.   Respiratory: Negative.    Cardiovascular: Negative.   Gastrointestinal: Negative.   Genitourinary: Negative.   Musculoskeletal: Negative.   Neurological: Negative.   Endo/Heme/Allergies: Negative.   Psychiatric/Behavioral:  Positive for substance abuse. The patient is  nervous/anxious.    Blood pressure 111/68, pulse 64, temperature 98.2 F (36.8 C), temperature source Oral, resp. rate 18, SpO2 99%. There is no height or weight on file to calculate BMI.  Treatment Plan Summary: Medication regimen:  --continue Suboxone 1 tablet sublingual twice daily for opiate use dx --continue Depakote 250 mg nightly for mood dx.  --continue metformin 250 mg twice daily DM --continue sertraline 100 mg daily for mood disorder --continue Seroquel 100 mg p.o. for mood stabilization --continue Minipress 1 mg p.o. PTSD/nightmares  PRNs --continue melatonin 3 mg p.o. nightly as needed for sleep --continue maalox 30 ml every 4 hours prn for indigestion --continue baclofen 5 mg Q8H prn for muscle spasms --continue vistaril 25 mg TID prn for anxiety   Vital signs reviewed and are stable.   Labs reviewed: positive for amphetamines, benzos, THC, can cocaine. Valproic level <10 on 8/24  Disposition: pending, patient is seeking residential substance abuse treatment.    Lance Muss, MD 09/13/2022 11:04 AM

## 2022-09-13 NOTE — Group Note (Signed)
Group Topic: Fears and Unhealthy Coping Skills  Group Date: 09/13/2022 Start Time: 1430 End Time: 1500 Facilitators: Dickie La, RN  Department: [facility]  Number of Participants: 2  Group Focus: coping skills, daily focus, and self-awareness Treatment Modality:  Psychoeducation Interventions utilized were patient education and reality testing Purpose: enhance coping skills and increase insight  Name: Joshua Thomas Date of Birth: [birth year]  MR: [id number]    Level of Participation: Pt did not attend group. Quality of Participation: n/a Interactions with others: n/a Mood/Affect: n/a Triggers (if applicable): n/a Cognition: n/a Progress: n/a Response: n/a Plan: follow-up needed  Patients Problems:  Patient Active Problem List   Diagnosis Date Noted   Polysubstance abuse (HCC) 09/03/2022   Substance induced mood disorder (HCC) 07/29/2022   Right inguinal hernia 05/24/2022   Abdominal adhesions 05/24/2022   Epidural abscess 10/08/2021   Diabetes mellitus (HCC) 10/08/2021   Bilateral low back pain with left-sided sciatica    Acute osteomyelitis (HCC) 07/04/2021   Opioid use disorder, severe, dependence (HCC) 12/15/2020   Hepatitis C 12/15/2020   MDD (major depressive disorder), recurrent severe, 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

## 2022-09-13 NOTE — Progress Notes (Signed)
Pt slept most of the shift but woke up for meals. No distress noted or concerns voiced. Pt is presently in resting quietly in his room.Staff will monitor for pt's safety.

## 2022-09-13 NOTE — Progress Notes (Signed)
Pt is asleep. Respirations are even and unlabored. No signs of acute distress noted. Staff will monitor for pt's safety. 

## 2022-09-14 MED ORDER — BUPRENORPHINE HCL-NALOXONE HCL 2-0.5 MG SL SUBL
1.0000 | SUBLINGUAL_TABLET | Freq: Once | SUBLINGUAL | Status: AC
  Administered 2022-09-15: 1 via SUBLINGUAL
  Filled 2022-09-14: qty 1

## 2022-09-14 NOTE — ED Notes (Signed)
Pt is currently sleeping, no distress noted, environmental check complete, will continue to monitor patient for safety.  

## 2022-09-14 NOTE — ED Notes (Signed)
Patient observed resting quietly, eyes closed. Respirations equal and unlabored. Will continue to monitor for safety.  

## 2022-09-14 NOTE — Group Note (Signed)
Group Topic: Change and Accountability  Group Date: 09/14/2022 Start Time: 1030 End Time: 1115 Facilitators: Donnie Coffin, NT  Department: [facility]  Number of Participants: 4  Group Focus: personal responsibility Treatment Modality:  Psychoeducation Interventions utilized were patient education Purpose: enhance coping skills, increase insight, and regain self-worth  Name: Joshua Thomas Date of Birth: 08/01/1975  MR: [id number]    Level of Participation: active Quality of Participation: attentive and cooperative Interactions with others: gave feedback Mood/Affect: appropriate Triggers (if applicable): N/A Cognition: coherent/clear Progress: Gaining insight Response: Patient listened during group Plan: patient will be encouraged to attend group  Patients Problems:  Patient Active Problem List   Diagnosis Date Noted   Polysubstance abuse (HCC) 09/03/2022   Substance induced mood disorder (HCC) 07/29/2022   Right inguinal hernia 05/24/2022   Abdominal adhesions 05/24/2022   Epidural abscess 10/08/2021   Diabetes mellitus (HCC) 10/08/2021   Bilateral low back pain with left-sided sciatica    Acute osteomyelitis (HCC) 07/04/2021   Opioid use disorder, severe, dependence (HCC) 12/15/2020   Hepatitis C 12/15/2020   MDD (major depressive disorder), recurrent severe, 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

## 2022-09-14 NOTE — Discharge Planning (Signed)
LCSW was provided an update by MD regarding the patient and his plan. Per MD, patient is requesting residential placement at this time. LCSW reviewed chart and patient has been referred to multiple facilities and been denied. Patient has been denied at the following facilities: [place], [facility], [address], [facility], [facility], and [facility].   Referrals have been sent to [facility] and [facility], and patient has been provided resources for [employer] [facility] [place] and [employer] for his follow up. Patient asked if LCSW will follow up with his mother Westley Hummer [phone] stating "She's knows a place in [place] and may be able to go to that her church family can help me get into". LCSW explored why he has not made phone calls himself and patient reports "I'm working myself to do that". LCSW explained the [facility] process to the patient and informed him that we will need to come up with a plan, as the other option would be outpatient and local shelter resources.   Patient has been at the [facility] for 10 days and appears to not be proactive in his own treatment and assisting with locating placement. LCSW will follow up with MD and his mother on tomorrow.   Fernande Boyden, LCSW Clinical Social Worker [facility] Ph: [phone]

## 2022-09-14 NOTE — Group Note (Deleted)
Group Topic: Balance in Life  Group Date: 09/14/2022 Start Time: 1030 End Time: 1115 Facilitators: Donnie Coffin, NT  Department: [facility]  Number of Participants: 3  Group Focus: acceptance, problem solving, psychiatric education, and social skills Treatment Modality:  Psychoeducation Interventions utilized were group exercise, patient education, and problem solving Purpose: enhance coping skills and improve communication skills   Name: Joshua Thomas Date of Birth: October 27, 1975  MR: [id number]    Level of Participation: {THERAPIES; PSYCH GROUP PARTICIPATION OACZY:60630} Quality of Participation: {THERAPIES; PSYCH QUALITY OF PARTICIPATION:23992} Interactions with others: {THERAPIES; PSYCH INTERACTIONS:23993} Mood/Affect: {THERAPIES; PSYCH MOOD/AFFECT:23994} Triggers (if applicable): *** Cognition: {THERAPIES; PSYCH COGNITION:23995} Progress: {THERAPIES; PSYCH PROGRESS:23997} Response: *** Plan: {THERAPIES; PSYCH ZSWF:09323}  Patients Problems:  Patient Active Problem List   Diagnosis Date Noted   Polysubstance abuse (HCC) 09/03/2022   Substance induced mood disorder (HCC) 07/29/2022   Right inguinal hernia 05/24/2022   Abdominal adhesions 05/24/2022   Epidural abscess 10/08/2021   Diabetes mellitus (HCC) 10/08/2021   Bilateral low back pain with left-sided sciatica    Acute osteomyelitis (HCC) 07/04/2021   Opioid use disorder, severe, dependence (HCC) 12/15/2020   Hepatitis C 12/15/2020   MDD (major depressive disorder), recurrent severe, 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

## 2022-09-14 NOTE — ED Notes (Signed)
Patient is calm and cooperative. He denies SI/HI/AVH. Patient reports sleep was "alright" last night. Patient endorsed back pain which is chronic but declined pain medication. He denies any additional needs at this time. Will continue to monitor for safety.

## 2022-09-14 NOTE — ED Notes (Signed)
Pt is in the dayroom watching TV with peers. Pt worried about his placement, stating everyday comes with different story and doesn't know what to do anymore.Pt denies SI/HI/AVH. No acute distress noted. Will continue to monitor for safety.

## 2022-09-14 NOTE — Group Note (Signed)
Group Topic: Communication  Group Date: 09/14/2022 Start Time: 2030 End Time: 2055 Facilitators: Rae Lips B  Department: [facility]  Number of Participants: 5  Group Focus: abuse issues, acceptance, activities of daily living skills, and anger management Treatment Modality:  Exposure Therapy Interventions utilized were leisure development, story telling, and support Purpose: express feelings, relapse prevention strategies, and trigger / craving management  Name: Joshua Thomas Date of Birth: [birth year]  MR: [id number]    Level of Participation: active Quality of Participation: attentive Interactions with others: gave feedback Mood/Affect: appropriate Triggers (if applicable): NA Cognition: coherent/clear Progress: Gaining insight Response: NA Plan: patient will be encouraged to keep going to groups.  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

## 2022-09-14 NOTE — Discharge Instructions (Signed)
[facility] [address] [place], [place], [address] [phone] phone  New Patient Assessment/Therapy Walk-Ins:  Monday and Wednesday: 8 am until slots are full. Every 1st and 2nd Fridays of the month: 1 pm - 5 pm.  NO ASSESSMENT/THERAPY WALK-INS ON TUESDAYS OR THURSDAYS  New Patient Assessment/Medication Management Walk-Ins:  Monday - Friday:  8 am - 11 am.  For all walk-ins, we ask that you arrive by 7:30 am because patients will be seen in the order of arrival.  Availability is limited; therefore, you may not be seen on the same day that you walk-in.  Our goal is to serve and meet the needs of our community to the best of our ability.  SUBSTANCE USE TREATMENT for Medicaid and State Funded/IPRS  [facility] [address], [place], [address] [phone] phone NOTE: [facility] is no longer offering IOP services.  Serves those who are low-income or have no insurance.  [facility] [address], [place], [place], [address] [phone] phone [phone] fax NOTE: Does have Substance Abuse-Intensive Outpatient Program (SAIOP) as well as transitional housing if eligible.  [facility] [address] [place], [place], [address] [phone] phone [phone] fax  [facility] [address] [place], [place], [address] [phone] phone [phone] fax  HALFWAY HOUSES:  [facility] [phone]  [facility] www.oxfordvacancies.com  12 STEP PROGRAMS:  Alcoholics Anonymous of [place] https://aagreensboronc.com/meeting  Narcotics Anonymous of [place] https://greensborona.org/meetings/  Al-Anon of [place], [place] www.greens[place]alanon.org/find-meetings.html  Nar-Anon https://nar-anon.org/find-a-meetin  List of Residential placements:   [facility] in [place]: [phone]  [facility]: [phone]  [facility]: [place], [place]  [phone]: Male and male facility; 30-day program: (uninsured and Medicaid such as [facility], [facility], [facility], partners)  [facility]: [phone]; men and women's facility; 28 days; Can have Medicaid tailored plan ([facility] or [facility])  [facility]: [phone] [place] or [place]; 28 day program; must be fully detox; tailored Medicaid or no insurance  [facility] in [place], [place]; [phone]; 28 day all males program; no insurance accepted  [facility] Referral in [place]: Joe [phone] (no insurance or Medicaid only); 90 days; outpatient services but provide housing in apartments [place]  [facility] Admission: [phone]: Patient must complete phone screening for placement: [place], [place]; 6 month program; uninsured, Medicaid, and [facility] insurance.   [facility]: no insurance required; [phone]  [place] [facility]: [phone]; Intake: Robert; Must fill out application online; Victor Delay [phone] x [phone]  [facility] in [place], [place]: [phone]; Admissions Coordinators Mr. Dennis or David Gibson; 90 day program.  [facility]: [place], [place] [phone]; Co-Ed 9 month to a year program; Online application; Men entry fee is $500 (6-12months);  [facility]: [address] [place], [place] [address]; no fee or insurance required; minimum of 2 years; Highly structured; work based; Intake Coordinator is Chris [phone]  [facility] in [place], [place]: [phone]; Fax number is [phone]; website: [facility].org; Requires 3-6 page autobiography; 2 year program (18 months and then 6month transitional housing); Admission fee is $300; no insurance needed; work program  [facility] in [place], [place]: Front Desk Staff: Reeci [phone]: They have a Men's Regenerations Program 6-9months. Free program; There is an initial $300 fee however, they are willing to work  with patients regarding that. Application is online.  [facility]: Admissions [phone] Benjamin Cox ext [phone]; Any 7-90 day program is out of pocket; 12   month program is free of charge; there is a $275 entry fee; Patient is responsible for own transportation 

## 2022-09-14 NOTE — ED Provider Notes (Addendum)
Behavioral Health Progress Note  Date and Time: 09/14/2022 9:44 AM Name: Joshua Thomas MRN:  [id number]  Subjective: Patient seen this morning. He feels "fair". He reports fair sleep and good appetite. He also feels anxious and sad, worried that he will not be able to go anymore after discharging. I discussed that we will work with CSW to see the options after discharge. He denies issues with his current medication regimen. He denies SI, HI, and AVH.   HPI:  Joshua Thomas is a 47 y/o male with a history of PTSD, depression, substance abuse, bipolar disorder, adhd, fentanyl use, methamphetamine use, cocaine use and stimulant use presenting to [facility] voluntarily and unaccompanied after presenting to [facility]. Patient states that he has been using fentanyl and he wants treatment to stop. Patient reports that he uses about 1/2 gram of fentanyl intravenously daily and about a pint of alcohol daily. Patient reports that he relapsed on fentanyl, crack cocaine, alcohol and methamphetamine after being sober for about a year.   Diagnosis:  Final diagnoses:  Opioid use disorder  Substance induced mood disorder (HCC)  Alcohol use disorder    Total Time spent with patient: 20 minutes  Past Psychiatric History: History of bipolar disorder, depression, ADHD, PTSD, polysubstance abuse, opiate use, methamphetamine use, alcohol use, cocaine use.   Past Medical History: History of COPD, chronic back pain, degenerative disk disease, hep C, hypertension, and sciatica.  Family Psychiatric  History: Father has a history of alcoholism, and brother committed suicide.  Social History: Homelessness, unemployed, and substance abuse.  Current Medications:  Current Facility-Administered Medications  Medication Dose Route Frequency Provider Last Rate Last Admin   alum & mag hydroxide-simeth (MAALOX/MYLANTA) 200-200-20 MG/5ML suspension 30 mL  30 mL Oral Q4H PRN Augusto Gamble, MD       baclofen (LIORESAL)  tablet 5 mg  5 mg Oral Q8H PRN Augusto Gamble, MD   5 mg at 09/10/22 2218   bismuth subsalicylate (PEPTO BISMOL) chewable tablet 524 mg  524 mg Oral Q3H PRN Augusto Gamble, MD       buprenorphine-naloxone (SUBOXONE) 2-0.5 mg per SL tablet 1 tablet  1 tablet Sublingual BID Augusto Gamble, MD   1 tablet at 09/14/22 0908   haloperidol (HALDOL) tablet 5 mg  5 mg Oral Q6H PRN Augusto Gamble, MD       And   LORazepam (ATIVAN) tablet 1 mg  1 mg Oral Q6H PRN Augusto Gamble, MD       And   diphenhydrAMINE (BENADRYL) capsule 25 mg  25 mg Oral Q6H PRN Augusto Gamble, MD       haloperidol lactate (HALDOL) injection 5 mg  5 mg Intramuscular Q6H PRN Augusto Gamble, MD       And   LORazepam (ATIVAN) injection 1 mg  1 mg Intravenous Q6H PRN Augusto Gamble, MD       And   diphenhydrAMINE (BENADRYL) injection 25 mg  25 mg Intramuscular Q6H PRN Augusto Gamble, MD       divalproex (DEPAKOTE) DR tablet 250 mg  250 mg Oral QHS Bobbitt, Shalon E, NP   250 mg at 09/13/22 2113   hydrOXYzine (ATARAX) tablet 25 mg  25 mg Oral TID PRN Augusto Gamble, MD   25 mg at 09/12/22 1347   ibuprofen (ADVIL) tablet 600 mg  600 mg Oral Q6H PRN Augusto Gamble, MD   600 mg at 09/11/22 2112   lidocaine (LIDODERM) 5 % 1 patch  1 patch Transdermal  Daily Augusto Gamble, MD   1 patch at 09/13/22 0859   melatonin tablet 3 mg  3 mg Oral QHS PRN Augusto Gamble, MD   3 mg at 09/08/22 2122   metFORMIN (GLUCOPHAGE) tablet 250 mg  250 mg Oral BID WC Bobbitt, Shalon E, NP   250 mg at 09/14/22 0908   nicotine (NICODERM CQ - dosed in mg/24 hours) patch 21 mg  21 mg Transdermal Daily Carlyn Reichert, MD   21 mg at 09/14/22 0909   ondansetron (ZOFRAN) tablet 8 mg  8 mg Oral Q8H PRN Augusto Gamble, MD       polyethylene glycol (MIRALAX / GLYCOLAX) packet 17 g  17 g Oral Daily PRN Augusto Gamble, MD   17 g at 09/06/22 2154   prazosin (MINIPRESS) capsule 1 mg  1 mg Oral QHS Bobbitt, Shalon E, NP   1 mg at 09/13/22 2113   QUEtiapine (SEROQUEL) tablet 100 mg  100 mg Oral QHS Bobbitt, Shalon E, NP    100 mg at 09/13/22 2113   senna (SENOKOT) tablet 8.6 mg  1 tablet Oral QHS PRN Augusto Gamble, MD       sertraline (ZOLOFT) tablet 100 mg  100 mg Oral Daily Bobbitt, Shalon E, NP   100 mg at 09/14/22 0908   Current Outpatient Medications  Medication Sig Dispense Refill   Aspirin-Acetaminophen-Caffeine (GOODY HEADACHE PO) Take 1 packet by mouth daily as needed (for pain).     Buprenorphine HCl-Naloxone HCl (SUBOXONE) 8-2 MG FILM Place 1 Film under the tongue 2 (two) times daily.     Calcium Carbonate Antacid (TUMS PO) Take 2-3 tablets by mouth daily as needed (gerd).     cloNIDine (CATAPRES) 0.1 MG tablet Take 0.1 mg by mouth 2 (two) times daily.     cyclobenzaprine (FLEXERIL) 10 MG tablet Take 10 mg by mouth 2 (two) times daily as needed for muscle spasms.     divalproex (DEPAKOTE) 250 MG DR tablet Take 250 mg by mouth at bedtime.     hydrOXYzine (VISTARIL) 50 MG capsule Take 50 mg by mouth 2 (two) times daily as needed for anxiety.     ibuprofen (ADVIL) 200 MG tablet Take 800 mg by mouth every 6 (six) hours as needed for mild pain or headache.     Melatonin 10 MG TABS Take 10 mg by mouth at bedtime.     naloxone (NARCAN) nasal spray 4 mg/0.1 mL Take in case of overdose 1 each 0   prazosin (MINIPRESS) 1 MG capsule Take 1 mg by mouth at bedtime.     QUEtiapine (SEROQUEL) 100 MG tablet Take 100 mg by mouth at bedtime.     rOPINIRole (REQUIP) 0.5 MG tablet Take 0.5 mg by mouth at bedtime as needed (for restless legs).     sertraline (ZOLOFT) 50 MG tablet Take 100 mg by mouth daily.      Labs  Lab Results:  Admission on 09/03/2022  Component Date Value Ref Range Status   Valproic Acid Lvl 09/04/2022 <10 (L)  50.0 - 100.0 ug/mL Final   Comment: RESULT CONFIRMED BY MANUAL DILUTION Performed at [facility], [address] [place], [place] [address]    Hgb A1c MFr Bld 09/04/2022 7.3 (H)  4.8 - 5.6 % Final   Comment: (NOTE) Pre diabetes:          5.7%-6.4%  Diabetes:               >6.4%  Glycemic control for   <  7.0% adults with diabetes    Mean Plasma Glucose 09/04/2022 162.81  mg/dL Final   Performed at [facility], [address] [place], [place] [address]   Potassium 09/04/2022 4.0  3.5 - 5.1 mmol/L Final   Performed at [facility], [address] [place], [place] [address]   POC Amphetamine UR 09/07/2022 None Detected  NONE DETECTED (Cut Off Level 1000 ng/mL) Final   POC Secobarbital (BAR) 09/07/2022 None Detected  NONE DETECTED (Cut Off Level 300 ng/mL) Final   POC Buprenorphine (BUP) 09/07/2022 Positive (A)  NONE DETECTED (Cut Off Level 10 ng/mL) Final   POC Oxazepam (BZO) 09/07/2022 None Detected  NONE DETECTED (Cut Off Level 300 ng/mL) Final   POC Cocaine UR 09/07/2022 None Detected  NONE DETECTED (Cut Off Level 300 ng/mL) Final   POC Methamphetamine UR 09/07/2022 None Detected  NONE DETECTED (Cut Off Level 1000 ng/mL) Final   POC Morphine 09/07/2022 None Detected  NONE DETECTED (Cut Off Level 300 ng/mL) Final   POC Methadone UR 09/07/2022 None Detected  NONE DETECTED (Cut Off Level 300 ng/mL) Final   POC Oxycodone UR 09/07/2022 None Detected  NONE DETECTED (Cut Off Level 100 ng/mL) Final   POC Marijuana UR 09/07/2022 None Detected  NONE DETECTED (Cut Off Level 50 ng/mL) Final  Admission on 09/03/2022, Discharged on 09/03/2022  Component Date Value Ref Range Status   WBC 09/03/2022 5.5  4.0 - 10.5 K/uL Final   RBC 09/03/2022 4.81  4.22 - 5.81 MIL/uL Final   Hemoglobin 09/03/2022 14.4  13.0 - 17.0 g/dL Final   HCT 01/19/3233 44.3  39.0 - 52.0 % Final   MCV 09/03/2022 92.1  80.0 - 100.0 fL Final   MCH 09/03/2022 29.9  26.0 - 34.0 pg Final   MCHC 09/03/2022 32.5  30.0 - 36.0 g/dL Final   RDW 57/32/2025 14.4  11.5 - 15.5 % Final   Platelets 09/03/2022 176  150 - 400 K/uL Final   nRBC 09/03/2022 0.0  0.0 - 0.2 % Final   Neutrophils Relative % 09/03/2022 63  % Final   Neutro Abs 09/03/2022 3.5  1.7 - 7.7 K/uL Final   Lymphocytes Relative  09/03/2022 30  % Final   Lymphs Abs 09/03/2022 1.7  0.7 - 4.0 K/uL Final   Monocytes Relative 09/03/2022 5  % Final   Monocytes Absolute 09/03/2022 0.3  0.1 - 1.0 K/uL Final   Eosinophils Relative 09/03/2022 2  % Final   Eosinophils Absolute 09/03/2022 0.1  0.0 - 0.5 K/uL Final   Basophils Relative 09/03/2022 0  % Final   Basophils Absolute 09/03/2022 0.0  0.0 - 0.1 K/uL Final   Immature Granulocytes 09/03/2022 0  % Final   Abs Immature Granulocytes 09/03/2022 0.02  0.00 - 0.07 K/uL Final   Performed at [facility], [address] [place], [place] [address]   Alcohol, Ethyl (B) 09/03/2022 <10  <10 mg/dL Final   Comment: (NOTE) Lowest detectable limit for serum alcohol is 10 mg/dL.  For medical purposes only. Performed at [facility], [address], [place], [place] [address]    Sodium 09/03/2022 136  135 - 145 mmol/L Final   Potassium 09/03/2022 3.4 (L)  3.5 - 5.1 mmol/L Final   Chloride 09/03/2022 103  98 - 111 mmol/L Final   CO2 09/03/2022 24  22 - 32 mmol/L Final   Glucose, Bld 09/03/2022 273 (H)  70 - 99 mg/dL Final   Glucose reference range applies  only to samples taken after fasting for at least 8 hours.   BUN 09/03/2022 14  6 - 20 mg/dL Final   Creatinine, Ser 09/03/2022 0.83  0.61 - 1.24 mg/dL Final   Calcium 16/10/9602 9.4  8.9 - 10.3 mg/dL Final   Total Protein 54/09/8117 7.5  6.5 - 8.1 g/dL Final   Albumin 14/78/2956 3.9  3.5 - 5.0 g/dL Final   AST 21/30/8657 32  15 - 41 U/L Final   ALT 09/03/2022 53 (H)  0 - 44 U/L Final   Alkaline Phosphatase 09/03/2022 59  38 - 126 U/L Final   Total Bilirubin 09/03/2022 0.3  0.3 - 1.2 mg/dL Final   GFR, Estimated 09/03/2022 >60  >60 mL/min Final   Comment: (NOTE) Calculated using the CKD-EPI Creatinine Equation (2021)    Anion gap 09/03/2022 9  5 - 15 Final   Performed at [facility], [address] [place], [place] [address]   Acetaminophen (Tylenol), Serum  09/03/2022 <10 (L)  10 - 30 ug/mL Final   Comment: (NOTE) Therapeutic concentrations vary significantly. A range of 10-30 ug/mL  may be an effective concentration for many patients. However, some  are best treated at concentrations outside of this range. Acetaminophen concentrations >150 ug/mL at 4 hours after ingestion  and >50 ug/mL at 12 hours after ingestion are often associated with  toxic reactions.  Performed at [facility], [address] [place], [place] [address]    Salicylate Lvl 09/03/2022 <7.0 (L)  7.0 - 30.0 mg/dL Final   Performed at [facility], [address] [place], [place] [address]   Opiates 09/03/2022 NONE DETECTED  NONE DETECTED Final   Cocaine 09/03/2022 POSITIVE (A)  NONE DETECTED Final   Benzodiazepines 09/03/2022 POSITIVE (A)  NONE DETECTED Final   Amphetamines 09/03/2022 POSITIVE (A)  NONE DETECTED Final   Tetrahydrocannabinol 09/03/2022 POSITIVE (A)  NONE DETECTED Final   Barbiturates 09/03/2022 NONE DETECTED  NONE DETECTED Final   Comment: (NOTE) DRUG SCREEN FOR MEDICAL PURPOSES ONLY.  IF CONFIRMATION IS NEEDED FOR ANY PURPOSE, NOTIFY LAB WITHIN 5 DAYS.  LOWEST DETECTABLE LIMITS FOR URINE DRUG SCREEN Drug Class                     Cutoff (ng/mL) Amphetamine and metabolites    1000 Barbiturate and metabolites    200 Benzodiazepine                 200 Opiates and metabolites        300 Cocaine and metabolites        300 THC                            50 Performed at [facility], [address] [place], [place] [address]   Admission on 07/29/2022, Discharged on 07/29/2022  Component Date Value Ref Range Status   Sodium 07/29/2022 135  135 - 145 mmol/L Final   Potassium 07/29/2022 3.3 (L)  3.5 - 5.1 mmol/L Final   Chloride 07/29/2022 103  98 - 111 mmol/L Final   CO2 07/29/2022 23  22 - 32 mmol/L Final   Glucose, Bld 07/29/2022 131 (H)  70 - 99 mg/dL Final   Glucose reference range  applies only to samples taken after fasting for at least 8 hours.   BUN 07/29/2022 8  6 - 20 mg/dL Final   Creatinine, Ser 07/29/2022 0.82  0.61 - 1.24 mg/dL Final   Calcium 29/56/2130 8.5 (L)  8.9 - 10.3 mg/dL Final   Total Protein 86/57/8469 7.3  6.5 - 8.1 g/dL Final   Albumin 62/95/2841 3.6  3.5 - 5.0 g/dL Final   AST 32/44/0102 39  15 - 41 U/L Final   ALT 07/29/2022 44  0 - 44 U/L Final   Alkaline Phosphatase 07/29/2022 46  38 - 126 U/L Final   Total Bilirubin 07/29/2022 0.4  0.3 - 1.2 mg/dL Final   GFR, Estimated 07/29/2022 >60  >60 mL/min Final   Comment: (NOTE) Calculated using the CKD-EPI Creatinine Equation (2021)    Anion gap 07/29/2022 9  5 - 15 Final   Performed at [facility], [address], [place], [place] [address]   Alcohol, Ethyl (B) 07/29/2022 <10  <10 mg/dL Final   Comment: (NOTE) Lowest detectable limit for serum alcohol is 10 mg/dL.  For medical purposes only. Performed at [facility], [address] [place], [place] [address]    Salicylate Lvl 07/29/2022 <7.0 (L)  7.0 - 30.0 mg/dL Final   Performed at [facility], [address] [place], [place] [address]   Acetaminophen (Tylenol), Serum 07/29/2022 <10 (L)  10 - 30 ug/mL Final   Comment: (NOTE) Therapeutic concentrations vary significantly. A range of 10-30 ug/mL  may be an effective concentration for many patients. However, some  are best treated at concentrations outside of this range. Acetaminophen concentrations >150 ug/mL at 4 hours after ingestion  and >50 ug/mL at 12 hours after ingestion are often associated with  toxic reactions.  Performed at [facility], [address] [place], [place] [address]    WBC 07/29/2022 5.7  4.0 - 10.5 K/uL Final   RBC 07/29/2022 4.40  4.22 - 5.81 MIL/uL Final   Hemoglobin 07/29/2022 13.3  13.0 - 17.0 g/dL Final   HCT 75/64/3329 39.5  39.0 - 52.0 % Final   MCV 07/29/2022 89.8  80.0  - 100.0 fL Final   MCH 07/29/2022 30.2  26.0 - 34.0 pg Final   MCHC 07/29/2022 33.7  30.0 - 36.0 g/dL Final   RDW 51/88/4166 14.0  11.5 - 15.5 % Final   Platelets 07/29/2022 167  150 - 400 K/uL Final   nRBC 07/29/2022 0.0  0.0 - 0.2 % Final   Performed at [facility], [address] [place], [place] [address]   Opiates 07/29/2022 NONE DETECTED  NONE DETECTED Final   Cocaine 07/29/2022 POSITIVE (A)  NONE DETECTED Final   Benzodiazepines 07/29/2022 POSITIVE (A)  NONE DETECTED Final   Amphetamines 07/29/2022 NONE DETECTED  NONE DETECTED Final   Tetrahydrocannabinol 07/29/2022 NONE DETECTED  NONE DETECTED Final   Barbiturates 07/29/2022 NONE DETECTED  NONE DETECTED Final   Comment: (NOTE) DRUG SCREEN FOR MEDICAL PURPOSES ONLY.  IF CONFIRMATION IS NEEDED FOR ANY PURPOSE, NOTIFY LAB WITHIN 5 DAYS.  LOWEST DETECTABLE LIMITS FOR URINE DRUG SCREEN Drug Class                     Cutoff (ng/mL) Amphetamine and metabolites    1000 Barbiturate and metabolites    200 Benzodiazepine                 200 Opiates and metabolites        300 Cocaine and metabolites        300 THC  50 Performed at [facility], [address] [place], [place] [address]   Admission on 05/28/2022, Discharged on 05/29/2022  Component Date Value Ref Range Status   Sodium 05/28/2022 137  135 - 145 mmol/L Final   Potassium 05/28/2022 3.8  3.5 - 5.1 mmol/L Final   Chloride 05/28/2022 103  98 - 111 mmol/L Final   CO2 05/28/2022 24  22 - 32 mmol/L Final   Glucose, Bld 05/28/2022 146 (H)  70 - 99 mg/dL Final   Glucose reference range applies only to samples taken after fasting for at least 8 hours.   BUN 05/28/2022 14  6 - 20 mg/dL Final   Creatinine, Ser 05/28/2022 0.98  0.61 - 1.24 mg/dL Final   Calcium 62/13/0865 8.9  8.9 - 10.3 mg/dL Final   Total Protein 78/46/9629 7.6  6.5 - 8.1 g/dL Final   Albumin 52/84/1324 3.7  3.5 - 5.0 g/dL Final   AST  40/10/2723 20  15 - 41 U/L Final   ALT 05/28/2022 23  0 - 44 U/L Final   Alkaline Phosphatase 05/28/2022 37 (L)  38 - 126 U/L Final   Total Bilirubin 05/28/2022 0.4  0.3 - 1.2 mg/dL Final   GFR, Estimated 05/28/2022 >60  >60 mL/min Final   Comment: (NOTE) Calculated using the CKD-EPI Creatinine Equation (2021)    Anion gap 05/28/2022 10  5 - 15 Final   Performed at [facility], [address], [place], [place] [address]   Lipase 05/28/2022 22  11 - 51 U/L Final   Performed at [facility], [address] [place], [place] [address]   WBC 05/28/2022 5.5  4.0 - 10.5 K/uL Final   RBC 05/28/2022 4.73  4.22 - 5.81 MIL/uL Final   Hemoglobin 05/28/2022 14.1  13.0 - 17.0 g/dL Final   HCT 25/95/6387 43.4  39.0 - 52.0 % Final   MCV 05/28/2022 91.8  80.0 - 100.0 fL Final   MCH 05/28/2022 29.8  26.0 - 34.0 pg Final   MCHC 05/28/2022 32.5  30.0 - 36.0 g/dL Final   RDW 56/43/3295 14.0  11.5 - 15.5 % Final   Platelets 05/28/2022 193  150 - 400 K/uL Final   nRBC 05/28/2022 0.0  0.0 - 0.2 % Final   Neutrophils Relative % 05/28/2022 48  % Final   Neutro Abs 05/28/2022 2.7  1.7 - 7.7 K/uL Final   Lymphocytes Relative 05/28/2022 41  % Final   Lymphs Abs 05/28/2022 2.2  0.7 - 4.0 K/uL Final   Monocytes Relative 05/28/2022 8  % Final   Monocytes Absolute 05/28/2022 0.4  0.1 - 1.0 K/uL Final   Eosinophils Relative 05/28/2022 3  % Final   Eosinophils Absolute 05/28/2022 0.2  0.0 - 0.5 K/uL Final   Basophils Relative 05/28/2022 0  % Final   Basophils Absolute 05/28/2022 0.0  0.0 - 0.1 K/uL Final   Immature Granulocytes 05/28/2022 0  % Final   Abs Immature Granulocytes 05/28/2022 0.02  0.00 - 0.07 K/uL Final   Performed at [facility], [address] [place], [place] [address]   Color, Urine 05/29/2022 STRAW (A)  YELLOW Final   APPearance 05/29/2022 CLEAR  CLEAR Final   Specific Gravity, Urine 05/29/2022 1.029  1.005 - 1.030 Final   pH 05/29/2022 6.0  5.0 - 8.0 Final   Glucose, UA  05/29/2022 NEGATIVE  NEGATIVE mg/dL Final   Hgb urine dipstick 05/29/2022 NEGATIVE  NEGATIVE Final   Bilirubin Urine  05/29/2022 NEGATIVE  NEGATIVE Final   Ketones, ur 05/29/2022 NEGATIVE  NEGATIVE mg/dL Final   Protein, ur 82/95/6213 NEGATIVE  NEGATIVE mg/dL Final   Nitrite 08/65/7846 NEGATIVE  NEGATIVE Final   Leukocytes,Ua 05/29/2022 NEGATIVE  NEGATIVE Final   RBC / HPF 05/29/2022 0-5  0 - 5 RBC/hpf Final   WBC, UA 05/29/2022 0-5  0 - 5 WBC/hpf Final   Bacteria, UA 05/29/2022 NONE SEEN  NONE SEEN Final   Squamous Epithelial / HPF 05/29/2022 0-5  0 - 5 /HPF Final   Performed at [facility], [address], [place], [place] [address]  Admission on 05/24/2022, Discharged on 05/24/2022  Component Date Value Ref Range Status   Glucose-Capillary 05/24/2022 151 (H)  70 - 99 mg/dL Final   Glucose reference range applies only to samples taken after fasting for at least 8 hours.   Glucose-Capillary 05/24/2022 175 (H)  70 - 99 mg/dL Final   Glucose reference range applies only to samples taken after fasting for at least 8 hours.  Hospital Outpatient Visit on 05/21/2022  Component Date Value Ref Range Status   WBC 05/21/2022 3.7 (L)  4.0 - 10.5 K/uL Final   RBC 05/21/2022 4.43  4.22 - 5.81 MIL/uL Final   Hemoglobin 05/21/2022 13.4  13.0 - 17.0 g/dL Final   HCT 28/41/3244 40.5  39.0 - 52.0 % Final   MCV 05/21/2022 91.4  80.0 - 100.0 fL Final   MCH 05/21/2022 30.2  26.0 - 34.0 pg Final   MCHC 05/21/2022 33.1  30.0 - 36.0 g/dL Final   RDW 01/13/7251 14.6  11.5 - 15.5 % Final   Platelets 05/21/2022 130 (L)  150 - 400 K/uL Final   nRBC 05/21/2022 0.0  0.0 - 0.2 % Final   Neutrophils Relative % 05/21/2022 44  % Final   Neutro Abs 05/21/2022 1.6 (L)  1.7 - 7.7 K/uL Final   Lymphocytes Relative 05/21/2022 45  % Final   Lymphs Abs 05/21/2022 1.6  0.7 - 4.0 K/uL Final   Monocytes Relative 05/21/2022 6  % Final   Monocytes Absolute 05/21/2022 0.2  0.1 - 1.0 K/uL Final   Eosinophils Relative  05/21/2022 5  % Final   Eosinophils Absolute 05/21/2022 0.2  0.0 - 0.5 K/uL Final   Basophils Relative 05/21/2022 0  % Final   Basophils Absolute 05/21/2022 0.0  0.0 - 0.1 K/uL Final   Immature Granulocytes 05/21/2022 0  % Final   Abs Immature Granulocytes 05/21/2022 0.01  0.00 - 0.07 K/uL Final   Performed at [facility], [address], [place], [place] [address]   Hgb A1c MFr Bld 05/21/2022 6.7 (H)  4.8 - 5.6 % Final   Comment: (NOTE) Pre diabetes:          5.7%-6.4%  Diabetes:              >6.4%  Glycemic control for   <7.0% adults with diabetes    Mean Plasma Glucose 05/21/2022 145.59  mg/dL Final   Performed at [facility], [address] [place], [place] [address]   Sodium 05/21/2022 133 (L)  135 - 145 mmol/L Final   Potassium 05/21/2022 3.9  3.5 - 5.1 mmol/L Final   Chloride 05/21/2022 102  98 - 111 mmol/L Final   CO2 05/21/2022 22  22 - 32 mmol/L Final   Glucose, Bld 05/21/2022 242 (H)  70 - 99 mg/dL Final   Glucose reference range applies only to samples taken after fasting for  at least 8 hours.   BUN 05/21/2022 22 (H)  6 - 20 mg/dL Final   Creatinine, Ser 05/21/2022 0.93  0.61 - 1.24 mg/dL Final   Calcium 40/34/7425 9.1  8.9 - 10.3 mg/dL Final   Total Protein 95/63/8756 7.3  6.5 - 8.1 g/dL Final   Albumin 43/32/9518 3.6  3.5 - 5.0 g/dL Final   AST 84/16/6063 20  15 - 41 U/L Final   ALT 05/21/2022 36  0 - 44 U/L Final   Alkaline Phosphatase 05/21/2022 41  38 - 126 U/L Final   Total Bilirubin 05/21/2022 0.8  0.3 - 1.2 mg/dL Final   GFR, Estimated 05/21/2022 >60  >60 mL/min Final   Comment: (NOTE) Calculated using the CKD-EPI Creatinine Equation (2021)    Anion gap 05/21/2022 9  5 - 15 Final   Performed at [facility], [address] [place], [place] [address]   Prothrombin Time 05/21/2022 13.7  11.4 - 15.2 seconds Final   INR 05/21/2022 1.0  0.8 - 1.2 Final   Comment: (NOTE) INR goal varies based on device and disease states. Performed at [facility], [address] [place], [place] [address]    Opiates 05/21/2022 NONE DETECTED  NONE DETECTED Final   Cocaine 05/21/2022 NONE DETECTED  NONE DETECTED Final   Benzodiazepines 05/21/2022 NONE DETECTED  NONE DETECTED Final   Amphetamines 05/21/2022 NONE DETECTED  NONE DETECTED Final   Tetrahydrocannabinol 05/21/2022 NONE DETECTED  NONE DETECTED Final   Barbiturates 05/21/2022 NONE DETECTED  NONE DETECTED Final   Comment: (NOTE) DRUG SCREEN FOR MEDICAL PURPOSES ONLY.  IF CONFIRMATION IS NEEDED FOR ANY PURPOSE, NOTIFY LAB WITHIN 5 DAYS.  LOWEST DETECTABLE LIMITS FOR URINE DRUG SCREEN Drug Class                     Cutoff (ng/mL) Amphetamine and metabolites    1000 Barbiturate and metabolites    200 Benzodiazepine                 200 Opiates and metabolites        300 Cocaine and metabolites        300 THC                            50 Performed at [facility], [address] [place], [place] [address]     Blood Alcohol level:  Lab Results  Component Value Date   [facility] <10 09/03/2022   ETH <10 07/29/2022    Metabolic Disorder Labs: Lab Results  Component Value Date   HGBA1C 7.3 (H) 09/04/2022   MPG 162.81 09/04/2022   MPG 145.59 05/21/2022   Lab Results  Component Value Date   PROLACTIN 31.3 (H) 05/23/2015   Lab Results  Component Value Date   CHOL 129 12/09/2020   TRIG 84 12/09/2020   HDL 29 (L) 12/09/2020   CHOLHDL 4.4 12/09/2020   VLDL 17 12/09/2020   LDLCALC 83 12/09/2020   LDLCALC 49 10/08/2020    Therapeutic Lab Levels: No results found for: "LITHIUM" Lab Results  Component Value Date   VALPROATE <10 (L) 09/04/2022   No results found for: "CBMZ"  Physical Findings   AIMS    Flowsheet Row [facility]-Admission (Discharged) [facility] ([facility]charged) [facility]  AIMS  Total Score 0 0 0      AUDIT    Flowsheet Row [facility]charged) [facility] (Discharged[facility]-Admission (Discharged) [facility] (Discharged) [facility]  Alcohol Use Disorder Identification Test Final Score (AUDIT) 1 1 6  0      GAD-7    Flowsheet Row Office Visit from 01/31/2018 in [facility] Office Visit from 12/13/2017 in [facility] Office Visit from 11/29/2017 in [facility]  Total GAD-7 Score 19 19 6       PHQ2-9    Flowsheet Row ED from 09/03/2022 in [facility] Office Visit from 03/14/2018 in [facility] Office Visit from 02/28/2018 in [facility] Office Visit from 02/14/2018 in [facility] Office Visit from 01/31/2018 in [facility]  PHQ-2 Total Score 0 2 3 6 4   PHQ-9 Total Score 5 15 18 23 17       Flowsheet Row ED from 09/03/2022 in [facility] Most recent reading at 09/04/2022  2:01 AM ED from 09/03/2022 in [facility] Emergency Department at [facility] Most recent reading at 09/03/2022  6:33 PM ED from 07/29/2022 in [facility] Emergency Department at [facility] Most recent reading at 07/29/2022  8:25 AM  C-SSRS RISK CATEGORY No Risk High Risk High Risk        Musculoskeletal  Strength & Muscle Tone: within normal limits Gait & Station: normal Patient leans: N/A  Psychiatric Specialty Exam  Presentation  General Appearance:  Appropriate for Environment  Eye Contact: Good  Speech: Clear and Coherent  Speech Volume: Normal  Handedness: Right   Mood  and Affect  Mood: Anxious  Affect: Congruent; Appropriate   Thought Process  Thought Processes: Coherent  Descriptions of Associations:Intact  Orientation:Full (Time, Place and Person)  Thought Content:Logical  Diagnosis of Schizophrenia or Schizoaffective disorder in past: No    Hallucinations:Hallucinations: None  Ideas of Reference:None  Suicidal Thoughts:Suicidal Thoughts: No  Homicidal Thoughts:Homicidal Thoughts: No   Sensorium  Memory: Immediate Good; Recent Good; Remote Good  Judgment: Fair  Insight: Fair   Chartered certified accountant: Fair  Attention Span: Fair  Recall: [employer] of Knowledge: Fair  Language: Fair   Psychomotor Activity  Psychomotor Activity: Psychomotor Activity: Normal   Assets  Assets: Communication Skills; Desire for Improvement; Financial Resources/Insurance; Resilience   Sleep  Sleep: Sleep: Fair   Physical Exam  Physical Exam HENT:     Head: Normocephalic.     Nose: Nose normal.  Eyes:     Conjunctiva/sclera: Conjunctivae normal.  Cardiovascular:     Rate and Rhythm: Normal rate.  Pulmonary:     Effort: Pulmonary effort is normal.  Musculoskeletal:        General: Normal range of motion.     Cervical back: Normal range of motion.  Neurological:     Mental Status: He is alert and oriented to person, place, and time.    Review of Systems  Constitutional: Negative.   HENT: Negative.    Eyes: Negative.   Respiratory: Negative.    Cardiovascular: Negative.   Gastrointestinal: Negative.   Genitourinary: Negative.   Musculoskeletal: Negative.   Neurological: Negative.   Endo/Heme/Allergies: Negative.  Psychiatric/Behavioral:  Positive for substance abuse. The patient is nervous/anxious.    Blood pressure 104/78, pulse (!) 52, temperature 98 F (36.7 C), temperature source Oral, resp. rate 16, SpO2 98%. There is no height or weight on file to calculate BMI.  Treatment Plan  Summary: Medication regimen:  --continue Suboxone 1 tablet sublingual twice daily for opiate use dx- last dose 9/4 AM --continue Depakote 250 mg nightly for mood dx.  --continue metformin 250 mg twice daily DM --continue sertraline 100 mg daily for mood disorder --continue Seroquel 100 mg p.o. for mood stabilization --continue Minipress 1 mg p.o. PTSD/nightmares  PRNs --continue melatonin 3 mg p.o. nightly as needed for sleep --continue maalox 30 ml every 4 hours prn for indigestion --continue baclofen 5 mg Q8H prn for muscle spasms --continue vistaril 25 mg TID prn for anxiety   Vital signs reviewed and are stable.   Labs reviewed: positive for amphetamines, benzos, THC, can cocaine. Valproic level <10 on 8/24  Disposition: pending, patient is seeking residential substance abuse treatment.    Lance Muss, MD 09/14/2022 9:44 AM

## 2022-09-14 NOTE — ED Notes (Signed)
Patient is sleeping. Respirations equal and unlabored, skin warm and dry. No change in assessment or acuity. Routine safety checks conducted according to facility protocol. Will continue to monitor for safety.   

## 2022-09-14 NOTE — ED Notes (Signed)
Patient currently in dining room eating dinner and conversing with peers. Pt is reserved. No needs noted at this time. Will continue to monitor for safety.

## 2022-09-15 NOTE — Group Note (Signed)
Group Topic: Relapse and Recovery  Group Date: 09/15/2022 Start Time: 1200 End Time: 1215 Facilitators: Jenean Lindau, RN  Department: [facility]  Number of Participants: 5  Group Focus: chemical dependency education Treatment Modality:  Behavior Modification Therapy Interventions utilized were exploration and group exercise Purpose: express feelings, express irrational fears, improve communication skills, increase insight, and regain self-worth  Name: BRANTON ALLENDE Date of Birth: 07/13/1975  MR: [id number]    Level of Participation: active Quality of Participation: attentive and cooperative Interactions with others: gave feedback Mood/Affect: appropriate Triggers (if applicable):   Cognition: coherent/clear Progress: Gaining insight Response:   Plan: follow-up needed  Patients Problems:  Patient Active Problem List   Diagnosis Date Noted   Polysubstance abuse (HCC) 09/03/2022   Substance induced mood disorder (HCC) 07/29/2022   Right inguinal hernia 05/24/2022   Abdominal adhesions 05/24/2022   Epidural abscess 10/08/2021   Diabetes mellitus (HCC) 10/08/2021   Bilateral low back pain with left-sided sciatica    Acute osteomyelitis (HCC) 07/04/2021   Opioid use disorder, severe, dependence (HCC) 12/15/2020   Hepatitis C 12/15/2020   MDD (major depressive disorder), recurrent severe, 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

## 2022-09-15 NOTE — Group Note (Signed)
Group Topic: Positive Affirmations  Group Date: 09/15/2022 Start Time: 1730 End Time: 1745 Facilitators: Vonzell Schlatter B  Department: [facility]  Number of Participants: 5  Group Focus: affirmation Treatment Modality:  Psychoeducation Interventions utilized were support Purpose: regain self-worth and reinforce self-care  Name: Joshua Thomas Date of Birth: 11/27/1975  MR: [id number]    Level of Participation: minimal Quality of Participation: attentive and cooperative Interactions with others: gave feedback Mood/Affect: positive Triggers (if applicable): n/a Cognition: coherent/clear Progress: Moderate Response: n/a Plan: follow-up needed  Patients Problems:  Patient Active Problem List   Diagnosis Date Noted   Polysubstance abuse (HCC) 09/03/2022   Substance induced mood disorder (HCC) 07/29/2022   Right inguinal hernia 05/24/2022   Abdominal adhesions 05/24/2022   Epidural abscess 10/08/2021   Diabetes mellitus (HCC) 10/08/2021   Bilateral low back pain with left-sided sciatica    Acute osteomyelitis (HCC) 07/04/2021   Opioid use disorder, severe, dependence (HCC) 12/15/2020   Hepatitis C 12/15/2020   MDD (major depressive disorder), recurrent severe, 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

## 2022-09-15 NOTE — ED Notes (Addendum)
Pt is in the dayroom watching TV with peers. Pt denies SI/HI/AVH. No acute distress noted. Will continue to monitor for safety. 

## 2022-09-15 NOTE — Group Note (Signed)
Group Topic: Healthy Self Image and Positive Change  Group Date: 09/15/2022 Start Time: 2015 End Time: 2040 Facilitators: Vassie Loll, RN  Department: [facility]  Number of Participants: 4  Group Focus: goals/reality orientation Treatment Modality:  Psychoeducation Interventions utilized were support Purpose: enhance coping skills  Name: Joshua Thomas Date of Birth: Mar 09, 1975  MR: [id number]    Level of Participation: active Quality of Participation: attentive Interactions with others: gave feedback Mood/Affect: appropriate Triggers (if applicable): n/a Cognition: coherent/clear Progress: Moderate Response: n/a Plan: follow-up needed  Patients Problems:  Patient Active Problem List   Diagnosis Date Noted   Polysubstance abuse (HCC) 09/03/2022   Substance induced mood disorder (HCC) 07/29/2022   Right inguinal hernia 05/24/2022   Abdominal adhesions 05/24/2022   Epidural abscess 10/08/2021   Diabetes mellitus (HCC) 10/08/2021   Bilateral low back pain with left-sided sciatica    Acute osteomyelitis (HCC) 07/04/2021   Opioid use disorder, severe, dependence (HCC) 12/15/2020   Hepatitis C 12/15/2020   MDD (major depressive disorder), recurrent severe, 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

## 2022-09-15 NOTE — ED Notes (Signed)
Patient spoke with provider and social work and was informed that he will be discharged tomorrow.  Patient had spent the morning making phone calls to no avail.  He cannot find a rehab program that will accept him/.

## 2022-09-15 NOTE — ED Notes (Signed)
Patient is sleeping. Respirations equal and unlabored, skin warm and dry. No change in assessment or acuity. Routine safety checks conducted according to facility protocol. Will continue to monitor for safety.   

## 2022-09-15 NOTE — ED Provider Notes (Signed)
Behavioral Health Progress Note  Date and Time: 09/15/2022 9:58 AM Name: Joshua Thomas MRN:  [id number]  Subjective: Patient seen this morning. He notes feeling anxious. He feels unsure of the discharge plan. He notes making calls to sober living of [place] yesterday. He denies issues with sleep and appetite. He denies SI, HI, and AVH. He feels depressed, saying "I feel no better than I did when I was when I arrived". He denies issues with his medication regimen. I encouraged the patient to stay proactive in looking for a place for when he is discharged.   HPI:  Joshua Thomas is a 47 y/o male with a history of PTSD, depression, substance abuse, bipolar disorder, adhd, fentanyl use, methamphetamine use, cocaine use and stimulant use presenting to [facility] voluntarily and unaccompanied after presenting to [facility]. Patient states that he has been using fentanyl and he wants treatment to stop. Patient reports that he uses about 1/2 gram of fentanyl intravenously daily and about a pint of alcohol daily. Patient reports that he relapsed on fentanyl, crack cocaine, alcohol and methamphetamine after being sober for about a year.   Diagnosis:  Final diagnoses:  Opioid use disorder  Substance induced mood disorder (HCC)  Alcohol use disorder    Total Time spent with patient: 20 minutes  Past Psychiatric History: History of bipolar disorder, depression, ADHD, PTSD, polysubstance abuse, opiate use, methamphetamine use, alcohol use, cocaine use.   Past Medical History: History of COPD, chronic back pain, degenerative disk disease, hep C, hypertension, and sciatica.  Family Psychiatric  History: Father has a history of alcoholism, and brother committed suicide.  Social History: Homelessness, unemployed, and substance abuse.  Current Medications:  Current Facility-Administered Medications  Medication Dose Route Frequency Provider Last Rate Last Admin   alum & mag hydroxide-simeth  (MAALOX/MYLANTA) 200-200-20 MG/5ML suspension 30 mL  30 mL Oral Q4H PRN Augusto Gamble, MD       baclofen (LIORESAL) tablet 5 mg  5 mg Oral Q8H PRN Augusto Gamble, MD   5 mg at 09/10/22 2218   bismuth subsalicylate (PEPTO BISMOL) chewable tablet 524 mg  524 mg Oral Q3H PRN Augusto Gamble, MD       haloperidol (HALDOL) tablet 5 mg  5 mg Oral Q6H PRN Augusto Gamble, MD       And   LORazepam (ATIVAN) tablet 1 mg  1 mg Oral Q6H PRN Augusto Gamble, MD       And   diphenhydrAMINE (BENADRYL) capsule 25 mg  25 mg Oral Q6H PRN Augusto Gamble, MD       haloperidol lactate (HALDOL) injection 5 mg  5 mg Intramuscular Q6H PRN Augusto Gamble, MD       And   LORazepam (ATIVAN) injection 1 mg  1 mg Intravenous Q6H PRN Augusto Gamble, MD       And   diphenhydrAMINE (BENADRYL) injection 25 mg  25 mg Intramuscular Q6H PRN Augusto Gamble, MD       divalproex (DEPAKOTE) DR tablet 250 mg  250 mg Oral QHS Bobbitt, Shalon E, NP   250 mg at 09/14/22 2110   hydrOXYzine (ATARAX) tablet 25 mg  25 mg Oral TID PRN Augusto Gamble, MD   25 mg at 09/12/22 1347   ibuprofen (ADVIL) tablet 600 mg  600 mg Oral Q6H PRN Augusto Gamble, MD   600 mg at 09/11/22 2112   lidocaine (LIDODERM) 5 % 1 patch  1 patch Transdermal Daily Augusto Gamble, MD   1  patch at 09/15/22 0945   melatonin tablet 3 mg  3 mg Oral QHS PRN Augusto Gamble, MD   3 mg at 09/08/22 2122   metFORMIN (GLUCOPHAGE) tablet 250 mg  250 mg Oral BID WC Bobbitt, Shalon E, NP   250 mg at 09/15/22 8756   nicotine (NICODERM CQ - dosed in mg/24 hours) patch 21 mg  21 mg Transdermal Daily Carlyn Reichert, MD   21 mg at 09/15/22 0927   ondansetron (ZOFRAN) tablet 8 mg  8 mg Oral Q8H PRN Augusto Gamble, MD       polyethylene glycol (MIRALAX / GLYCOLAX) packet 17 g  17 g Oral Daily PRN Augusto Gamble, MD   17 g at 09/06/22 2154   prazosin (MINIPRESS) capsule 1 mg  1 mg Oral QHS Bobbitt, Shalon E, NP   1 mg at 09/14/22 2110   QUEtiapine (SEROQUEL) tablet 100 mg  100 mg Oral QHS Bobbitt, Shalon E, NP   100 mg at 09/14/22 2110    senna (SENOKOT) tablet 8.6 mg  1 tablet Oral QHS PRN Augusto Gamble, MD       sertraline (ZOLOFT) tablet 100 mg  100 mg Oral Daily Bobbitt, Shalon E, NP   100 mg at 09/15/22 4332   Current Outpatient Medications  Medication Sig Dispense Refill   Aspirin-Acetaminophen-Caffeine (GOODY HEADACHE PO) Take 1 packet by mouth daily as needed (for pain).     Buprenorphine HCl-Naloxone HCl (SUBOXONE) 8-2 MG FILM Place 1 Film under the tongue 2 (two) times daily.     Calcium Carbonate Antacid (TUMS PO) Take 2-3 tablets by mouth daily as needed (gerd).     cloNIDine (CATAPRES) 0.1 MG tablet Take 0.1 mg by mouth 2 (two) times daily.     cyclobenzaprine (FLEXERIL) 10 MG tablet Take 10 mg by mouth 2 (two) times daily as needed for muscle spasms.     divalproex (DEPAKOTE) 250 MG DR tablet Take 250 mg by mouth at bedtime.     hydrOXYzine (VISTARIL) 50 MG capsule Take 50 mg by mouth 2 (two) times daily as needed for anxiety.     ibuprofen (ADVIL) 200 MG tablet Take 800 mg by mouth every 6 (six) hours as needed for mild pain or headache.     Melatonin 10 MG TABS Take 10 mg by mouth at bedtime.     naloxone (NARCAN) nasal spray 4 mg/0.1 mL Take in case of overdose 1 each 0   prazosin (MINIPRESS) 1 MG capsule Take 1 mg by mouth at bedtime.     QUEtiapine (SEROQUEL) 100 MG tablet Take 100 mg by mouth at bedtime.     rOPINIRole (REQUIP) 0.5 MG tablet Take 0.5 mg by mouth at bedtime as needed (for restless legs).     sertraline (ZOLOFT) 50 MG tablet Take 100 mg by mouth daily.      Labs  Lab Results:  Admission on 09/03/2022  Component Date Value Ref Range Status   Valproic Acid Lvl 09/04/2022 <10 (L)  50.0 - 100.0 ug/mL Final   Comment: RESULT CONFIRMED BY MANUAL DILUTION Performed at [facility], [address], [place], [place] [address]    Hgb A1c MFr Bld 09/04/2022 7.3 (H)  4.8 - 5.6 % Final   Comment: (NOTE) Pre diabetes:          5.7%-6.4%  Diabetes:              >6.4%  Glycemic control for    <7.0% adults with diabetes  Mean Plasma Glucose 09/04/2022 162.81  mg/dL Final   Performed at [facility], [address] [place], [place] [address]   Potassium 09/04/2022 4.0  3.5 - 5.1 mmol/L Final   Performed at [facility], [address], [place], [place] [address]   POC Amphetamine UR 09/07/2022 None Detected  NONE DETECTED (Cut Off Level 1000 ng/mL) Final   POC Secobarbital (BAR) 09/07/2022 None Detected  NONE DETECTED (Cut Off Level 300 ng/mL) Final   POC Buprenorphine (BUP) 09/07/2022 Positive (A)  NONE DETECTED (Cut Off Level 10 ng/mL) Final   POC Oxazepam (BZO) 09/07/2022 None Detected  NONE DETECTED (Cut Off Level 300 ng/mL) Final   POC Cocaine UR 09/07/2022 None Detected  NONE DETECTED (Cut Off Level 300 ng/mL) Final   POC Methamphetamine UR 09/07/2022 None Detected  NONE DETECTED (Cut Off Level 1000 ng/mL) Final   POC Morphine 09/07/2022 None Detected  NONE DETECTED (Cut Off Level 300 ng/mL) Final   POC Methadone UR 09/07/2022 None Detected  NONE DETECTED (Cut Off Level 300 ng/mL) Final   POC Oxycodone UR 09/07/2022 None Detected  NONE DETECTED (Cut Off Level 100 ng/mL) Final   POC Marijuana UR 09/07/2022 None Detected  NONE DETECTED (Cut Off Level 50 ng/mL) Final  Admission on 09/03/2022, Discharged on 09/03/2022  Component Date Value Ref Range Status   WBC 09/03/2022 5.5  4.0 - 10.5 K/uL Final   RBC 09/03/2022 4.81  4.22 - 5.81 MIL/uL Final   Hemoglobin 09/03/2022 14.4  13.0 - 17.0 g/dL Final   HCT 09/81/1914 44.3  39.0 - 52.0 % Final   MCV 09/03/2022 92.1  80.0 - 100.0 fL Final   MCH 09/03/2022 29.9  26.0 - 34.0 pg Final   MCHC 09/03/2022 32.5  30.0 - 36.0 g/dL Final   RDW 78/29/5621 14.4  11.5 - 15.5 % Final   Platelets 09/03/2022 176  150 - 400 K/uL Final   nRBC 09/03/2022 0.0  0.0 - 0.2 % Final   Neutrophils Relative % 09/03/2022 63  % Final   Neutro Abs 09/03/2022 3.5  1.7 - 7.7 K/uL Final   Lymphocytes Relative 09/03/2022 30  % Final   Lymphs  Abs 09/03/2022 1.7  0.7 - 4.0 K/uL Final   Monocytes Relative 09/03/2022 5  % Final   Monocytes Absolute 09/03/2022 0.3  0.1 - 1.0 K/uL Final   Eosinophils Relative 09/03/2022 2  % Final   Eosinophils Absolute 09/03/2022 0.1  0.0 - 0.5 K/uL Final   Basophils Relative 09/03/2022 0  % Final   Basophils Absolute 09/03/2022 0.0  0.0 - 0.1 K/uL Final   Immature Granulocytes 09/03/2022 0  % Final   Abs Immature Granulocytes 09/03/2022 0.02  0.00 - 0.07 K/uL Final   Performed at [facility], [address] [place], [place] [address]   Alcohol, Ethyl (B) 09/03/2022 <10  <10 mg/dL Final   Comment: (NOTE) Lowest detectable limit for serum alcohol is 10 mg/dL.  For medical purposes only. Performed at [facility], [address], [place], [place] [address]    Sodium 09/03/2022 136  135 - 145 mmol/L Final   Potassium 09/03/2022 3.4 (L)  3.5 - 5.1 mmol/L Final   Chloride 09/03/2022 103  98 - 111 mmol/L Final   CO2 09/03/2022 24  22 - 32 mmol/L Final   Glucose, Bld 09/03/2022 273 (H)  70 - 99 mg/dL Final   Glucose reference range applies only to samples taken after fasting for  at least 8 hours.   BUN 09/03/2022 14  6 - 20 mg/dL Final   Creatinine, Ser 09/03/2022 0.83  0.61 - 1.24 mg/dL Final   Calcium 25/36/6440 9.4  8.9 - 10.3 mg/dL Final   Total Protein 34/74/2595 7.5  6.5 - 8.1 g/dL Final   Albumin 63/87/5643 3.9  3.5 - 5.0 g/dL Final   AST 32/95/1884 32  15 - 41 U/L Final   ALT 09/03/2022 53 (H)  0 - 44 U/L Final   Alkaline Phosphatase 09/03/2022 59  38 - 126 U/L Final   Total Bilirubin 09/03/2022 0.3  0.3 - 1.2 mg/dL Final   GFR, Estimated 09/03/2022 >60  >60 mL/min Final   Comment: (NOTE) Calculated using the CKD-EPI Creatinine Equation (2021)    Anion gap 09/03/2022 9  5 - 15 Final   Performed at [facility], [address], [place], [place] [address]   Acetaminophen (Tylenol), Serum 09/03/2022 <10 (L)  10 - 30 ug/mL Final    Comment: (NOTE) Therapeutic concentrations vary significantly. A range of 10-30 ug/mL  may be an effective concentration for many patients. However, some  are best treated at concentrations outside of this range. Acetaminophen concentrations >150 ug/mL at 4 hours after ingestion  and >50 ug/mL at 12 hours after ingestion are often associated with  toxic reactions.  Performed at [facility], [address], [place], [place] [address]    Salicylate Lvl 09/03/2022 <7.0 (L)  7.0 - 30.0 mg/dL Final   Performed at [facility], [address] [place] [address] [place], [place] [address]   Opiates 09/03/2022 NONE DETECTED  NONE DETECTED Final   Cocaine 09/03/2022 POSITIVE (A)  NONE DETECTED Final   Benzodiazepines 09/03/2022 POSITIVE (A)  NONE DETECTED Final   Amphetamines 09/03/2022 POSITIVE (A)  NONE DETECTED Final   Tetrahydrocannabinol 09/03/2022 POSITIVE (A)  NONE DETECTED Final   Barbiturates 09/03/2022 NONE DETECTED  NONE DETECTED Final   Comment: (NOTE) DRUG SCREEN FOR MEDICAL PURPOSES ONLY.  IF CONFIRMATION IS NEEDED FOR ANY PURPOSE, NOTIFY LAB WITHIN 5 DAYS.  LOWEST DETECTABLE LIMITS FOR URINE DRUG SCREEN Drug Class                     Cutoff (ng/mL) Amphetamine and metabolites    1000 Barbiturate and metabolites    200 Benzodiazepine                 200 Opiates and metabolites        300 Cocaine and metabolites        300 THC                            50 Performed at [facility], [address], [place], [place] [address]   Admission on 07/29/2022, Discharged on 07/29/2022  Component Date Value Ref Range Status   Sodium 07/29/2022 135  135 - 145 mmol/L Final   Potassium 07/29/2022 3.3 (L)  3.5 - 5.1 mmol/L Final   Chloride 07/29/2022 103  98 - 111 mmol/L Final   CO2 07/29/2022 23  22 - 32 mmol/L Final   Glucose, Bld 07/29/2022 131 (H)  70 - 99 mg/dL Final   Glucose reference range applies only to samples taken after fasting  for at least 8 hours.   BUN 07/29/2022 8  6 - 20 mg/dL Final   Creatinine, Ser 07/29/2022 0.82  0.61 - 1.24 mg/dL Final  Calcium 07/29/2022 8.5 (L)  8.9 - 10.3 mg/dL Final   Total Protein 71/24/5809 7.3  6.5 - 8.1 g/dL Final   Albumin 98/33/8250 3.6  3.5 - 5.0 g/dL Final   AST 53/97/6734 39  15 - 41 U/L Final   ALT 07/29/2022 44  0 - 44 U/L Final   Alkaline Phosphatase 07/29/2022 46  38 - 126 U/L Final   Total Bilirubin 07/29/2022 0.4  0.3 - 1.2 mg/dL Final   GFR, Estimated 07/29/2022 >60  >60 mL/min Final   Comment: (NOTE) Calculated using the CKD-EPI Creatinine Equation (2021)    Anion gap 07/29/2022 9  5 - 15 Final   Performed at [facility], [address] [place], [place] [address]   Alcohol, Ethyl (B) 07/29/2022 <10  <10 mg/dL Final   Comment: (NOTE) Lowest detectable limit for serum alcohol is 10 mg/dL.  For medical purposes only. Performed at [facility], [address] [place], [place] [address]    Salicylate Lvl 07/29/2022 <7.0 (L)  7.0 - 30.0 mg/dL Final   Performed at [facility], [address], [place], [place] [address]   Acetaminophen (Tylenol), Serum 07/29/2022 <10 (L)  10 - 30 ug/mL Final   Comment: (NOTE) Therapeutic concentrations vary significantly. A range of 10-30 ug/mL  may be an effective concentration for many patients. However, some  are best treated at concentrations outside of this range. Acetaminophen concentrations >150 ug/mL at 4 hours after ingestion  and >50 ug/mL at 12 hours after ingestion are often associated with  toxic reactions.  Performed at [facility], [address], [place], [place] [address]    WBC 07/29/2022 5.7  4.0 - 10.5 K/uL Final   RBC 07/29/2022 4.40  4.22 - 5.81 MIL/uL Final   Hemoglobin 07/29/2022 13.3  13.0 - 17.0 g/dL Final   HCT 68/34/1962 39.5  39.0 - 52.0 % Final   MCV 07/29/2022 89.8  80.0 - 100.0 fL Final   MCH 07/29/2022 30.2   26.0 - 34.0 pg Final   MCHC 07/29/2022 33.7  30.0 - 36.0 g/dL Final   RDW 22/97/9892 14.0  11.5 - 15.5 % Final   Platelets 07/29/2022 167  150 - 400 K/uL Final   nRBC 07/29/2022 0.0  0.0 - 0.2 % Final   Performed at [facility], [address] [place], [place] [address]   Opiates 07/29/2022 NONE DETECTED  NONE DETECTED Final   Cocaine 07/29/2022 POSITIVE (A)  NONE DETECTED Final   Benzodiazepines 07/29/2022 POSITIVE (A)  NONE DETECTED Final   Amphetamines 07/29/2022 NONE DETECTED  NONE DETECTED Final   Tetrahydrocannabinol 07/29/2022 NONE DETECTED  NONE DETECTED Final   Barbiturates 07/29/2022 NONE DETECTED  NONE DETECTED Final   Comment: (NOTE) DRUG SCREEN FOR MEDICAL PURPOSES ONLY.  IF CONFIRMATION IS NEEDED FOR ANY PURPOSE, NOTIFY LAB WITHIN 5 DAYS.  LOWEST DETECTABLE LIMITS FOR URINE DRUG SCREEN Drug Class                     Cutoff (ng/mL) Amphetamine and metabolites    1000 Barbiturate and metabolites    200 Benzodiazepine                 200 Opiates and metabolites        300 Cocaine and metabolites        300 THC  50 Performed at [facility], [address], [place], [place] [address]   Admission on 05/28/2022, Discharged on 05/29/2022  Component Date Value Ref Range Status   Sodium 05/28/2022 137  135 - 145 mmol/L Final   Potassium 05/28/2022 3.8  3.5 - 5.1 mmol/L Final   Chloride 05/28/2022 103  98 - 111 mmol/L Final   CO2 05/28/2022 24  22 - 32 mmol/L Final   Glucose, Bld 05/28/2022 146 (H)  70 - 99 mg/dL Final   Glucose reference range applies only to samples taken after fasting for at least 8 hours.   BUN 05/28/2022 14  6 - 20 mg/dL Final   Creatinine, Ser 05/28/2022 0.98  0.61 - 1.24 mg/dL Final   Calcium 13/08/6576 8.9  8.9 - 10.3 mg/dL Final   Total Protein 46/96/2952 7.6  6.5 - 8.1 g/dL Final   Albumin 84/13/2440 3.7  3.5 - 5.0 g/dL Final   AST 11/07/2534 20  15 - 41 U/L Final   ALT  05/28/2022 23  0 - 44 U/L Final   Alkaline Phosphatase 05/28/2022 37 (L)  38 - 126 U/L Final   Total Bilirubin 05/28/2022 0.4  0.3 - 1.2 mg/dL Final   GFR, Estimated 05/28/2022 >60  >60 mL/min Final   Comment: (NOTE) Calculated using the CKD-EPI Creatinine Equation (2021)    Anion gap 05/28/2022 10  5 - 15 Final   Performed at [facility], [address], [place], [place] [address]   Lipase 05/28/2022 22  11 - 51 U/L Final   Performed at [facility], [address] [place], [place] [address]   WBC 05/28/2022 5.5  4.0 - 10.5 K/uL Final   RBC 05/28/2022 4.73  4.22 - 5.81 MIL/uL Final   Hemoglobin 05/28/2022 14.1  13.0 - 17.0 g/dL Final   HCT 95/63/8756 43.4  39.0 - 52.0 % Final   MCV 05/28/2022 91.8  80.0 - 100.0 fL Final   MCH 05/28/2022 29.8  26.0 - 34.0 pg Final   MCHC 05/28/2022 32.5  30.0 - 36.0 g/dL Final   RDW 43/32/9518 14.0  11.5 - 15.5 % Final   Platelets 05/28/2022 193  150 - 400 K/uL Final   nRBC 05/28/2022 0.0  0.0 - 0.2 % Final   Neutrophils Relative % 05/28/2022 48  % Final   Neutro Abs 05/28/2022 2.7  1.7 - 7.7 K/uL Final   Lymphocytes Relative 05/28/2022 41  % Final   Lymphs Abs 05/28/2022 2.2  0.7 - 4.0 K/uL Final   Monocytes Relative 05/28/2022 8  % Final   Monocytes Absolute 05/28/2022 0.4  0.1 - 1.0 K/uL Final   Eosinophils Relative 05/28/2022 3  % Final   Eosinophils Absolute 05/28/2022 0.2  0.0 - 0.5 K/uL Final   Basophils Relative 05/28/2022 0  % Final   Basophils Absolute 05/28/2022 0.0  0.0 - 0.1 K/uL Final   Immature Granulocytes 05/28/2022 0  % Final   Abs Immature Granulocytes 05/28/2022 0.02  0.00 - 0.07 K/uL Final   Performed at [facility], [address] [place], [place] [address]   Color, Urine 05/29/2022 STRAW (A)  YELLOW Final   APPearance 05/29/2022 CLEAR  CLEAR Final   Specific Gravity, Urine 05/29/2022 1.029  1.005 - 1.030 Final   pH 05/29/2022 6.0  5.0 - 8.0 Final   Glucose, UA 05/29/2022 NEGATIVE  NEGATIVE mg/dL Final    Hgb urine dipstick 05/29/2022 NEGATIVE  NEGATIVE Final   Bilirubin Urine  05/29/2022 NEGATIVE  NEGATIVE Final   Ketones, ur 05/29/2022 NEGATIVE  NEGATIVE mg/dL Final   Protein, ur 62/13/0865 NEGATIVE  NEGATIVE mg/dL Final   Nitrite 78/46/9629 NEGATIVE  NEGATIVE Final   Leukocytes,Ua 05/29/2022 NEGATIVE  NEGATIVE Final   RBC / HPF 05/29/2022 0-5  0 - 5 RBC/hpf Final   WBC, UA 05/29/2022 0-5  0 - 5 WBC/hpf Final   Bacteria, UA 05/29/2022 NONE SEEN  NONE SEEN Final   Squamous Epithelial / HPF 05/29/2022 0-5  0 - 5 /HPF Final   Performed at [facility], [address], [place], [place] [address]  Admission on 05/24/2022, Discharged on 05/24/2022  Component Date Value Ref Range Status   Glucose-Capillary 05/24/2022 151 (H)  70 - 99 mg/dL Final   Glucose reference range applies only to samples taken after fasting for at least 8 hours.   Glucose-Capillary 05/24/2022 175 (H)  70 - 99 mg/dL Final   Glucose reference range applies only to samples taken after fasting for at least 8 hours.  Hospital Outpatient Visit on 05/21/2022  Component Date Value Ref Range Status   WBC 05/21/2022 3.7 (L)  4.0 - 10.5 K/uL Final   RBC 05/21/2022 4.43  4.22 - 5.81 MIL/uL Final   Hemoglobin 05/21/2022 13.4  13.0 - 17.0 g/dL Final   HCT 32/44/0102 40.5  39.0 - 52.0 % Final   MCV 05/21/2022 91.4  80.0 - 100.0 fL Final   MCH 05/21/2022 30.2  26.0 - 34.0 pg Final   MCHC 05/21/2022 33.1  30.0 - 36.0 g/dL Final   RDW 72/53/6644 14.6  11.5 - 15.5 % Final   Platelets 05/21/2022 130 (L)  150 - 400 K/uL Final   nRBC 05/21/2022 0.0  0.0 - 0.2 % Final   Neutrophils Relative % 05/21/2022 44  % Final   Neutro Abs 05/21/2022 1.6 (L)  1.7 - 7.7 K/uL Final   Lymphocytes Relative 05/21/2022 45  % Final   Lymphs Abs 05/21/2022 1.6  0.7 - 4.0 K/uL Final   Monocytes Relative 05/21/2022 6  % Final   Monocytes Absolute 05/21/2022 0.2  0.1 - 1.0 K/uL Final   Eosinophils Relative 05/21/2022 5  % Final   Eosinophils Absolute  05/21/2022 0.2  0.0 - 0.5 K/uL Final   Basophils Relative 05/21/2022 0  % Final   Basophils Absolute 05/21/2022 0.0  0.0 - 0.1 K/uL Final   Immature Granulocytes 05/21/2022 0  % Final   Abs Immature Granulocytes 05/21/2022 0.01  0.00 - 0.07 K/uL Final   Performed at [facility], [address], [place], [place] [address]   Hgb A1c MFr Bld 05/21/2022 6.7 (H)  4.8 - 5.6 % Final   Comment: (NOTE) Pre diabetes:          5.7%-6.4%  Diabetes:              >6.4%  Glycemic control for   <7.0% adults with diabetes    Mean Plasma Glucose 05/21/2022 145.59  mg/dL Final   Performed at [facility], [address], [place], [place] [address]   Sodium 05/21/2022 133 (L)  135 - 145 mmol/L Final   Potassium 05/21/2022 3.9  3.5 - 5.1 mmol/L Final   Chloride 05/21/2022 102  98 - 111 mmol/L Final   CO2 05/21/2022 22  22 - 32 mmol/L Final   Glucose, Bld 05/21/2022 242 (H)  70 - 99 mg/dL Final   Glucose reference range applies only to samples taken after fasting for  at least 8 hours.   BUN 05/21/2022 22 (H)  6 - 20 mg/dL Final   Creatinine, Ser 05/21/2022 0.93  0.61 - 1.24 mg/dL Final   Calcium 13/08/6576 9.1  8.9 - 10.3 mg/dL Final   Total Protein 46/96/2952 7.3  6.5 - 8.1 g/dL Final   Albumin 84/13/2440 3.6  3.5 - 5.0 g/dL Final   AST 11/07/2534 20  15 - 41 U/L Final   ALT 05/21/2022 36  0 - 44 U/L Final   Alkaline Phosphatase 05/21/2022 41  38 - 126 U/L Final   Total Bilirubin 05/21/2022 0.8  0.3 - 1.2 mg/dL Final   GFR, Estimated 05/21/2022 >60  >60 mL/min Final   Comment: (NOTE) Calculated using the CKD-EPI Creatinine Equation (2021)    Anion gap 05/21/2022 9  5 - 15 Final   Performed at [facility], [address], [place], [place] [address]   Prothrombin Time 05/21/2022 13.7  11.4 - 15.2 seconds Final   INR 05/21/2022 1.0  0.8 - 1.2 Final   Comment: (NOTE) INR goal varies based on device and disease states. Performed at [facility], [address] [place], [place] [address]     Opiates 05/21/2022 NONE DETECTED  NONE DETECTED Final   Cocaine 05/21/2022 NONE DETECTED  NONE DETECTED Final   Benzodiazepines 05/21/2022 NONE DETECTED  NONE DETECTED Final   Amphetamines 05/21/2022 NONE DETECTED  NONE DETECTED Final   Tetrahydrocannabinol 05/21/2022 NONE DETECTED  NONE DETECTED Final   Barbiturates 05/21/2022 NONE DETECTED  NONE DETECTED Final   Comment: (NOTE) DRUG SCREEN FOR MEDICAL PURPOSES ONLY.  IF CONFIRMATION IS NEEDED FOR ANY PURPOSE, NOTIFY LAB WITHIN 5 DAYS.  LOWEST DETECTABLE LIMITS FOR URINE DRUG SCREEN Drug Class                     Cutoff (ng/mL) Amphetamine and metabolites    1000 Barbiturate and metabolites    200 Benzodiazepine                 200 Opiates and metabolites        300 Cocaine and metabolites        300 THC                            50 Performed at [facility], [address], [place], [place] [address]     Blood Alcohol level:  Lab Results  Component Value Date   [facility] <10 09/03/2022   ETH <10 07/29/2022    Metabolic Disorder Labs: Lab Results  Component Value Date   HGBA1C 7.3 (H) 09/04/2022   MPG 162.81 09/04/2022   MPG 145.59 05/21/2022   Lab Results  Component Value Date   PROLACTIN 31.3 (H) 05/23/2015   Lab Results  Component Value Date   CHOL 129 12/09/2020   TRIG 84 12/09/2020   HDL 29 (L) 12/09/2020   CHOLHDL 4.4 12/09/2020   VLDL 17 12/09/2020   LDLCALC 83 12/09/2020   LDLCALC 49 10/08/2020    Therapeutic Lab Levels: No results found for: "LITHIUM" Lab Results  Component Value Date   VALPROATE <10 (L) 09/04/2022   No results found for: "CBMZ"  Physical Findings   AIMS    Flowsheet Row [facility]Discharged) [facility]-Admission (Discharged) [facility] (Discharged) [facility]  AIMS Total  Score 0 0 0      AUDIT     Flowsheet Row Ad[facility]charged) [facility] (Discharged[facility]-Admission (Discharged) [facility] (Discharged) [facility]  Alcohol Use Disorder Identification Test Final Score (AUDIT) 1 1 6  0      GAD-7    Flowsheet Row Office Visit from 01/31/2018 in [facility] Office Visit from 12/13/2017 in [facility] Office Visit from 11/29/2017 in [facility]  Total GAD-7 Score 19 19 6       PHQ2-9    Flowsheet Row ED from 09/03/2022 in [facility] Office Visit from 03/14/2018 in [facility] Office Visit from 02/28/2018 in [facility] Office Visit from 02/14/2018 in [facility] Office Visit from 01/31/2018 in [facility]  PHQ-2 Total Score 0 2 3 6 4   PHQ-9 Total Score 5 15 18 23 17       Flowsheet Row ED from 09/03/2022 in [facility] Most recent reading at 09/04/2022  2:01 AM ED from 09/03/2022 in [facility] Emergency Department at [facility] Most recent reading at 09/03/2022  6:33 PM ED from 07/29/2022 in [facility] Emergency Department at [facility] Most recent reading at 07/29/2022  8:25 AM  C-SSRS RISK CATEGORY No Risk High Risk High Risk        Musculoskeletal  Strength & Muscle Tone: within normal limits Gait & Station: normal Patient leans: N/A  Psychiatric Specialty Exam  Presentation  General Appearance:  Appropriate for Environment  Eye Contact: Good  Speech: Clear and Coherent  Speech Volume: Normal  Handedness: Right   Mood and Affect   Mood: Anxious  Affect: Congruent   Thought Process  Thought Processes: Coherent  Descriptions of Associations:Intact  Orientation:Full (Time, Place and Person)  Thought Content:Logical  Diagnosis of Schizophrenia or Schizoaffective disorder in past: No    Hallucinations:Hallucinations: None  Ideas of Reference:None  Suicidal Thoughts:Suicidal Thoughts: No  Homicidal Thoughts:Homicidal Thoughts: No   Sensorium  Memory: Immediate Good; Remote Good; Recent Good  Judgment: Fair  Insight: Fair   Chartered certified accountant: Fair  Attention Span: Fair  Recall: [employer] of Knowledge: Fair  Language: Fair   Psychomotor Activity  Psychomotor Activity: Psychomotor Activity: Normal   Assets  Assets: Manufacturing systems engineer; Desire for Improvement; Financial Resources/Insurance; Resilience   Sleep  Sleep: Sleep: Fair   Physical Exam  Physical Exam HENT:     Head: Normocephalic.     Nose: Nose normal.  Eyes:     Conjunctiva/sclera: Conjunctivae normal.  Cardiovascular:     Rate and Rhythm: Normal rate.  Pulmonary:     Effort: Pulmonary effort is normal.  Musculoskeletal:        General: Normal range of motion.     Cervical back: Normal range of motion.  Neurological:     Mental Status: He is alert and oriented to person, place, and time.    Review of Systems  Constitutional: Negative.   HENT: Negative.    Eyes: Negative.   Respiratory: Negative.    Cardiovascular: Negative.   Gastrointestinal: Negative.   Genitourinary: Negative.   Musculoskeletal: Negative.   Neurological: Negative.   Endo/Heme/Allergies: Negative.  Psychiatric/Behavioral:  Positive for substance abuse. The patient is nervous/anxious.    Blood pressure 98/68, pulse 62, temperature 98 F (36.7 C), temperature source Oral, resp. rate 16, SpO2 98%. There is no height or weight on file to calculate BMI.  Treatment Plan Summary: Medication regimen:   --continue Suboxone 1 tablet daily last dose 9/4 AM --continue Depakote 250 mg nightly for mood dx.  --continue metformin 250 mg twice daily DM --continue sertraline 100 mg daily for mood disorder --continue Seroquel 100 mg p.o. for mood stabilization --continue Minipress 1 mg p.o. PTSD/nightmares  PRNs --continue melatonin 3 mg p.o. nightly as needed for sleep --continue maalox 30 ml every 4 hours prn for indigestion --continue baclofen 5 mg Q8H prn for muscle spasms --continue vistaril 25 mg TID prn for anxiety   Vital signs reviewed and are stable.   Labs reviewed: positive for amphetamines, benzos, THC, can cocaine. Valproic level <10 on 8/24  Disposition: pending, patient is seeking residential substance abuse treatment.    Lance Muss, MD 09/15/2022 9:58 AM

## 2022-09-15 NOTE — Discharge Planning (Signed)
LCSW and MD has spoken with patient regarding plan. Patient has been advised to follow up with [facility] and [facility] regarding possibly admission, however patient has remained in bed without making phone calls. Patient has to be redirected multiple times about being proactive in his treatment and assisting with finding placement. Patient continues to agree, however makes no phone calls. Patient was informed that he will be discharged on tomorrow due to their being no plan in place and there is not a need to hold him here at the [facility]. Patient has been provided resources for [employer] [place] and [employer] in the area, however has not made any contact.   Patient aware of plan for discharge on tomorrow. Patient reports he will not follow up with [facility] as he would prefer [facility] at this time.   LCSW has spoken with [facility] numerous of times. Patient has not been accepted, has a pending court date on 09/28/2022, would have to get court date pushed back, and agency is unsure of he will meet criteria due to the patient reporting "he only used once about $20 worth but has been clean for 14 months prior to that".   LCSW will continue to follow for updates.   Fernande Boyden, LCSW Clinical Social Worker [place] [facility] Ph: [phone]

## 2022-09-16 MED ORDER — NICOTINE 21 MG/24HR TD PT24: 21.0000 mg | MEDICATED_PATCH | Freq: Every day | TRANSDERMAL | 0 refills | Status: DC

## 2022-09-16 NOTE — ED Notes (Signed)
Patient is sleeping. Respirations equal and unlabored, skin warm and dry. No change in assessment or acuity. Routine safety checks conducted according to facility protocol. Will continue to monitor for safety.   

## 2022-09-16 NOTE — ED Provider Notes (Signed)
FBC/OBS ASAP Discharge Summary  Date and Time: 09/16/2022 9:41 AM  Name: Joshua Thomas  MRN:  [id number]   Discharge Diagnoses:  Final diagnoses:  Opioid use disorder  Substance induced mood disorder (HCC)  Alcohol use disorder    Subjective: Patient seen this morning. He reports feeling fine. He is about the same as yesterday. Patient has been provided with contact information for [facility] and [employer], but has not made any contact. Patient plans to go to his mother's home after discharge. CSW attempted to call mother to confirm and she did not answer. Patient denies issues with sleep and appetite. He denies SI, HI, and AVH.   Stay Summary: The patient was evaluated each day by a clinical provider to ascertain response to treatment. Improvement was noted by the patient's report of decreasing symptoms, improved sleep and appetite, affect, medication tolerance, behavior, and participation in unit programming.  Patient was asked each day to complete a self inventory noting mood, mental status, pain, new symptoms, anxiety and concerns.  The patient's medications were managed with the following directions: Treatment Plan Summary: Medication regimen:  --Suboxone 1 tablet daily last dose 9/4 AM --Depakote 250 mg nightly for mood dx.  --metformin 250 mg twice daily DM --sertraline 100 mg daily for mood disorder --Seroquel 100 mg p.o. for mood stabilization --Minipress 1 mg p.o. PTSD/nightmares   PRNs --melatonin 3 mg p.o. nightly as needed for sleep --maalox 30 ml every 4 hours prn for indigestion --baclofen 5 mg Q8H prn for muscle spasms --vistaril 25 mg TID prn for anxiety   Patient responded well to medication and being in a therapeutic and supportive environment. Positive and appropriate behavior was noted and the patient was motivated for recovery. The patient worked closely with the treatment team and case manager to develop a discharge plan with appropriate goals. Coping skills,  problem solving as well as relaxation therapies were also part of the unit programming.   Total Time spent with patient: 30 minutes   Past Psychiatric History: History of bipolar disorder, depression, ADHD, PTSD, polysubstance abuse, opiate use, methamphetamine use, alcohol use, cocaine use.   Past Medical History: History of COPD, chronic back pain, degenerative disk disease, hep C, hypertension, and sciatica.   Family Psychiatric  History: Father has a history of alcoholism, and brother committed suicide.   Social History: Homelessness, unemployed, and substance abuse. Tobacco Cessation:  A prescription for an FDA-approved tobacco cessation medication provided at discharge  Current Medications:  Current Facility-Administered Medications  Medication Dose Route Frequency Provider Last Rate Last Admin   alum & mag hydroxide-simeth (MAALOX/MYLANTA) 200-200-20 MG/5ML suspension 30 mL  30 mL Oral Q4H PRN Augusto Gamble, MD       baclofen (LIORESAL) tablet 5 mg  5 mg Oral Q8H PRN Augusto Gamble, MD   5 mg at 09/15/22 2107   bismuth subsalicylate (PEPTO BISMOL) chewable tablet 524 mg  524 mg Oral Q3H PRN Augusto Gamble, MD       haloperidol (HALDOL) tablet 5 mg  5 mg Oral Q6H PRN Augusto Gamble, MD       And   LORazepam (ATIVAN) tablet 1 mg  1 mg Oral Q6H PRN Augusto Gamble, MD       And   diphenhydrAMINE (BENADRYL) capsule 25 mg  25 mg Oral Q6H PRN Augusto Gamble, MD       haloperidol lactate (HALDOL) injection 5 mg  5 mg Intramuscular Q6H PRN Augusto Gamble, MD       And  LORazepam (ATIVAN) injection 1 mg  1 mg Intravenous Q6H PRN Augusto Gamble, MD       And   diphenhydrAMINE (BENADRYL) injection 25 mg  25 mg Intramuscular Q6H PRN Augusto Gamble, MD       divalproex (DEPAKOTE) DR tablet 250 mg  250 mg Oral QHS Bobbitt, Shalon E, NP   250 mg at 09/15/22 2104   hydrOXYzine (ATARAX) tablet 25 mg  25 mg Oral TID PRN Augusto Gamble, MD   25 mg at 09/15/22 2107   ibuprofen (ADVIL) tablet 600 mg  600 mg Oral Q6H PRN Augusto Gamble, MD   600 mg at 09/15/22 2107   lidocaine (LIDODERM) 5 % 1 patch  1 patch Transdermal Daily Augusto Gamble, MD   1 patch at 09/15/22 0945   melatonin tablet 3 mg  3 mg Oral QHS PRN Augusto Gamble, MD   3 mg at 09/08/22 2122   metFORMIN (GLUCOPHAGE) tablet 250 mg  250 mg Oral BID WC Bobbitt, Shalon E, NP   250 mg at 09/16/22 0939   nicotine (NICODERM CQ - dosed in mg/24 hours) patch 21 mg  21 mg Transdermal Daily Carlyn Reichert, MD   21 mg at 09/16/22 0939   ondansetron (ZOFRAN) tablet 8 mg  8 mg Oral Q8H PRN Augusto Gamble, MD       polyethylene glycol (MIRALAX / GLYCOLAX) packet 17 g  17 g Oral Daily PRN Augusto Gamble, MD   17 g at 09/06/22 2154   prazosin (MINIPRESS) capsule 1 mg  1 mg Oral QHS Bobbitt, Shalon E, NP   1 mg at 09/15/22 2104   QUEtiapine (SEROQUEL) tablet 100 mg  100 mg Oral QHS Bobbitt, Shalon E, NP   100 mg at 09/15/22 2104   senna (SENOKOT) tablet 8.6 mg  1 tablet Oral QHS PRN Augusto Gamble, MD       sertraline (ZOLOFT) tablet 100 mg  100 mg Oral Daily Bobbitt, Shalon E, NP   100 mg at 09/16/22 1610   Current Outpatient Medications  Medication Sig Dispense Refill   Aspirin-Acetaminophen-Caffeine (GOODY HEADACHE PO) Take 1 packet by mouth daily as needed (for pain).     Buprenorphine HCl-Naloxone HCl (SUBOXONE) 8-2 MG FILM Place 1 Film under the tongue 2 (two) times daily.     Calcium Carbonate Antacid (TUMS PO) Take 2-3 tablets by mouth daily as needed (gerd).     cloNIDine (CATAPRES) 0.1 MG tablet Take 0.1 mg by mouth 2 (two) times daily.     cyclobenzaprine (FLEXERIL) 10 MG tablet Take 10 mg by mouth 2 (two) times daily as needed for muscle spasms.     divalproex (DEPAKOTE) 250 MG DR tablet Take 250 mg by mouth at bedtime.     hydrOXYzine (VISTARIL) 50 MG capsule Take 50 mg by mouth 2 (two) times daily as needed for anxiety.     ibuprofen (ADVIL) 200 MG tablet Take 800 mg by mouth every 6 (six) hours as needed for mild pain or headache.     Melatonin 10 MG TABS Take 10 mg by  mouth at bedtime.     naloxone (NARCAN) nasal spray 4 mg/0.1 mL Take in case of overdose 1 each 0   prazosin (MINIPRESS) 1 MG capsule Take 1 mg by mouth at bedtime.     QUEtiapine (SEROQUEL) 100 MG tablet Take 100 mg by mouth at bedtime.     rOPINIRole (REQUIP) 0.5 MG tablet Take 0.5 mg by mouth at bedtime as needed (for restless legs).  sertraline (ZOLOFT) 50 MG tablet Take 100 mg by mouth daily.      PTA Medications:  Facility Ordered Medications  Medication   [COMPLETED] LORazepam (ATIVAN) injection 2 mg   sertraline (ZOLOFT) tablet 100 mg   QUEtiapine (SEROQUEL) tablet 100 mg   prazosin (MINIPRESS) capsule 1 mg   divalproex (DEPAKOTE) DR tablet 250 mg   metFORMIN (GLUCOPHAGE) tablet 250 mg   nicotine (NICODERM CQ - dosed in mg/24 hours) patch 21 mg   alum & mag hydroxide-simeth (MAALOX/MYLANTA) 200-200-20 MG/5ML suspension 30 mL   bismuth subsalicylate (PEPTO BISMOL) chewable tablet 524 mg   hydrOXYzine (ATARAX) tablet 25 mg   ondansetron (ZOFRAN) tablet 8 mg   polyethylene glycol (MIRALAX / GLYCOLAX) packet 17 g   senna (SENOKOT) tablet 8.6 mg   ibuprofen (ADVIL) tablet 600 mg   baclofen (LIORESAL) tablet 5 mg   [EXPIRED] dicyclomine (BENTYL) tablet 20 mg   [EXPIRED] loperamide (IMODIUM) capsule 2-4 mg   [EXPIRED] naproxen (NAPROSYN) tablet 500 mg   haloperidol lactate (HALDOL) injection 5 mg   And   LORazepam (ATIVAN) injection 1 mg   And   diphenhydrAMINE (BENADRYL) injection 25 mg   haloperidol (HALDOL) tablet 5 mg   And   LORazepam (ATIVAN) tablet 1 mg   And   diphenhydrAMINE (BENADRYL) capsule 25 mg   melatonin tablet 3 mg   lidocaine (LIDODERM) 5 % 1 patch   [COMPLETED] buprenorphine-naloxone (SUBOXONE) 2-0.5 mg per SL tablet 2 tablet   [COMPLETED] buprenorphine-naloxone (SUBOXONE) 2-0.5 mg per SL tablet 1 tablet   PTA Medications  Medication Sig   naloxone (NARCAN) nasal spray 4 mg/0.1 mL Take in case of overdose   Calcium Carbonate Antacid (TUMS PO) Take  2-3 tablets by mouth daily as needed (gerd).   QUEtiapine (SEROQUEL) 100 MG tablet Take 100 mg by mouth at bedtime.   prazosin (MINIPRESS) 1 MG capsule Take 1 mg by mouth at bedtime.   rOPINIRole (REQUIP) 0.5 MG tablet Take 0.5 mg by mouth at bedtime as needed (for restless legs).   Melatonin 10 MG TABS Take 10 mg by mouth at bedtime.   cloNIDine (CATAPRES) 0.1 MG tablet Take 0.1 mg by mouth 2 (two) times daily.   divalproex (DEPAKOTE) 250 MG DR tablet Take 250 mg by mouth at bedtime.   Buprenorphine HCl-Naloxone HCl (SUBOXONE) 8-2 MG FILM Place 1 Film under the tongue 2 (two) times daily.   cyclobenzaprine (FLEXERIL) 10 MG tablet Take 10 mg by mouth 2 (two) times daily as needed for muscle spasms.   ibuprofen (ADVIL) 200 MG tablet Take 800 mg by mouth every 6 (six) hours as needed for mild pain or headache.   hydrOXYzine (VISTARIL) 50 MG capsule Take 50 mg by mouth 2 (two) times daily as needed for anxiety.   sertraline (ZOLOFT) 50 MG tablet Take 100 mg by mouth daily.   Aspirin-Acetaminophen-Caffeine (GOODY HEADACHE PO) Take 1 packet by mouth daily as needed (for pain).       09/06/2022   12:43 PM 09/04/2022   12:57 AM 03/14/2018    9:41 AM  Depression screen PHQ 2/9  Decreased Interest 0 1 1  Down, Depressed, Hopeless 0 1 1  PHQ - 2 Score 0 2 2  Altered sleeping  1 3  Tired, decreased energy  1 1  Change in appetite  0 2  Feeling bad or failure about yourself   0 3  Trouble concentrating  1 3  Moving slowly or fidgety/restless  0 1  Suicidal thoughts  0 0  PHQ-9 Score  5 15  Difficult doing work/chores  Somewhat difficult Very difficult    Flowsheet Row ED from 09/03/2022 in [facility] Most recent reading at 09/04/2022  2:01 AM ED from 09/03/2022 in [facility] Emergency Department at [facility] Most recent reading at 09/03/2022  6:33 PM ED from 07/29/2022 in [facility] Emergency Department at [facility] Most recent reading at  07/29/2022  8:25 AM  C-SSRS RISK CATEGORY No Risk High Risk High Risk       Musculoskeletal  Strength & Muscle Tone: within normal limits Gait & Station: normal Patient leans: N/A  Psychiatric Specialty Exam  Presentation  General Appearance:  Appropriate for Environment  Eye Contact: Good  Speech: Clear and Coherent  Speech Volume: Normal  Handedness: Right   Mood and Affect  Mood: Anxious  Affect: Congruent   Thought Process  Thought Processes: Coherent  Descriptions of Associations:Intact  Orientation:Full (Time, Place and Person)  Thought Content:Logical  Diagnosis of Schizophrenia or Schizoaffective disorder in past: No    Hallucinations:Hallucinations: None  Ideas of Reference:None  Suicidal Thoughts:Suicidal Thoughts: No  Homicidal Thoughts:Homicidal Thoughts: No   Sensorium  Memory: Immediate Good; Remote Good; Recent Good  Judgment: Fair  Insight: Fair   Chartered certified accountant: Fair  Attention Span: Fair  Recall: [employer] of Knowledge: Fair  Language: Fair   Psychomotor Activity  Psychomotor Activity: Psychomotor Activity: Normal   Assets  Assets: Communication Skills; Desire for Improvement; Financial Resources/Insurance; Resilience   Sleep  Sleep: Sleep: Fair    Physical Exam  Physical Exam Vitals reviewed.  Constitutional:      Appearance: Normal appearance.  HENT:     Head: Normocephalic and atraumatic.  Cardiovascular:     Rate and Rhythm: Normal rate.  Pulmonary:     Effort: Pulmonary effort is normal.  Musculoskeletal:        General: Normal range of motion.  Neurological:     Mental Status: He is alert.    Review of Systems  Constitutional:  Negative for chills and fever.  Cardiovascular:  Negative for chest pain and palpitations.  Gastrointestinal:  Negative for nausea and vomiting.  Neurological:  Negative for headaches.   Blood pressure 116/67, pulse (!) 53,  temperature 97.6 F (36.4 C), temperature source Oral, resp. rate 16, SpO2 99%. There is no height or weight on file to calculate BMI.  Demographic Factors:  Male, Caucasian, and Unemployed  Loss Factors: Financial problems/change in socioeconomic status  Historical Factors: Impulsivity  Risk Reduction Factors:   NA  Continued Clinical Symptoms:  Alcohol/Substance Abuse/Dependencies  Cognitive Features That Contribute To Risk:  Closed-mindedness    Suicide Risk:  Mild:  Suicidal ideation of limited frequency, intensity, duration, and specificity.  There are no identifiable plans, no associated intent, mild dysphoria and related symptoms, good self-control (both objective and subjective assessment), few other risk factors, and identifiable protective factors, including available and accessible social support.  Plan Of Care/Follow-up recommendations:  Activity as tolerated Regular diet Continue prescription medications See PCP for medical conditions   Disposition: mother's home  Lance Muss, MD 09/16/2022, 9:41 AM

## 2022-09-16 NOTE — ED Notes (Signed)
Patient remains asleep in bed without issue or complaint.  Will monitor.   

## 2022-09-16 NOTE — Discharge Planning (Signed)
Patient was discharged on today with outpatient resources for substance use and local shelters. Patient became upset that he was discharging today stating, "You all haven't given me time to figure out things". LCSW spoke with patient about the various resources provided and facilities for him to follow up with to complete a phone screening and patient declined majority of the options. Patient reports not having an ID stating this is a barrier for placement to. LCSW encouraged patient to follow up with the Driver License Office in order to obtain it or contact the [facility] for assistance. Patient also aware that LCSW attempted multiple times to get in contact with his mother Westley Hummer [phone] with no success. Patient asked if he could stay until he hears something from her. Patient was informed that this is not a reason for facility to continue with a stay. Patient walked away from LCSW and went to pack up his things. Staff made aware. RN provided patient with a bus ticket and patient has been discharged.   Fernande Boyden, LCSW Clinical Social Worker [place] [facility] Ph: [phone]

## 2022-10-13 ENCOUNTER — Inpatient Hospital Stay (HOSPITAL_COMMUNITY)
Admission: EM | Admit: 2022-10-13 | Discharge: 2022-10-16 | DRG: 638 | Disposition: A | Discharge status: Home / Self Care | Payer: MEDICAID | Attending: Internal Medicine | Admitting: Internal Medicine

## 2022-10-13 ENCOUNTER — Emergency Department (HOSPITAL_COMMUNITY): Payer: MEDICAID

## 2022-10-13 ENCOUNTER — Encounter (HOSPITAL_COMMUNITY): Payer: Self-pay

## 2022-10-13 ENCOUNTER — Other Ambulatory Visit: Payer: Self-pay

## 2022-10-13 DIAGNOSIS — F332 Major depressive disorder, recurrent severe without psychotic features: Secondary | ICD-10-CM | POA: Diagnosis present

## 2022-10-13 DIAGNOSIS — E1169 Type 2 diabetes mellitus with other specified complication: Principal | ICD-10-CM | POA: Diagnosis present

## 2022-10-13 DIAGNOSIS — Z811 Family history of alcohol abuse and dependence: Secondary | ICD-10-CM | POA: Diagnosis not present

## 2022-10-13 DIAGNOSIS — F17210 Nicotine dependence, cigarettes, uncomplicated: Secondary | ICD-10-CM | POA: Diagnosis present

## 2022-10-13 DIAGNOSIS — E872 Acidosis, unspecified: Secondary | ICD-10-CM | POA: Diagnosis present

## 2022-10-13 DIAGNOSIS — F1510 Other stimulant abuse, uncomplicated: Secondary | ICD-10-CM | POA: Diagnosis present

## 2022-10-13 DIAGNOSIS — J449 Chronic obstructive pulmonary disease, unspecified: Secondary | ICD-10-CM | POA: Diagnosis present

## 2022-10-13 DIAGNOSIS — Z79899 Other long term (current) drug therapy: Secondary | ICD-10-CM

## 2022-10-13 DIAGNOSIS — Z87442 Personal history of urinary calculi: Secondary | ICD-10-CM

## 2022-10-13 DIAGNOSIS — M4627 Osteomyelitis of vertebra, lumbosacral region: Secondary | ICD-10-CM | POA: Diagnosis present

## 2022-10-13 DIAGNOSIS — I10 Essential (primary) hypertension: Secondary | ICD-10-CM | POA: Diagnosis present

## 2022-10-13 DIAGNOSIS — Z5900 Homelessness unspecified: Secondary | ICD-10-CM

## 2022-10-13 DIAGNOSIS — Z8711 Personal history of peptic ulcer disease: Secondary | ICD-10-CM

## 2022-10-13 DIAGNOSIS — F909 Attention-deficit hyperactivity disorder, unspecified type: Secondary | ICD-10-CM | POA: Diagnosis present

## 2022-10-13 DIAGNOSIS — M8660 Other chronic osteomyelitis, unspecified site: Secondary | ICD-10-CM

## 2022-10-13 DIAGNOSIS — B1920 Unspecified viral hepatitis C without hepatic coma: Secondary | ICD-10-CM | POA: Diagnosis present

## 2022-10-13 DIAGNOSIS — M4626 Osteomyelitis of vertebra, lumbar region: Secondary | ICD-10-CM | POA: Diagnosis present

## 2022-10-13 DIAGNOSIS — Z833 Family history of diabetes mellitus: Secondary | ICD-10-CM

## 2022-10-13 DIAGNOSIS — Z885 Allergy status to narcotic agent status: Secondary | ICD-10-CM

## 2022-10-13 DIAGNOSIS — M545 Low back pain, unspecified: Secondary | ICD-10-CM | POA: Diagnosis present

## 2022-10-13 DIAGNOSIS — F4310 Post-traumatic stress disorder, unspecified: Secondary | ICD-10-CM | POA: Diagnosis present

## 2022-10-13 DIAGNOSIS — F17290 Nicotine dependence, other tobacco product, uncomplicated: Secondary | ICD-10-CM | POA: Diagnosis present

## 2022-10-13 DIAGNOSIS — M4649 Discitis, unspecified, multiple sites in spine: Secondary | ICD-10-CM | POA: Diagnosis present

## 2022-10-13 DIAGNOSIS — F1910 Other psychoactive substance abuse, uncomplicated: Secondary | ICD-10-CM | POA: Diagnosis not present

## 2022-10-13 DIAGNOSIS — M8610 Other acute osteomyelitis, unspecified site: Secondary | ICD-10-CM | POA: Diagnosis not present

## 2022-10-13 DIAGNOSIS — K219 Gastro-esophageal reflux disease without esophagitis: Secondary | ICD-10-CM | POA: Diagnosis present

## 2022-10-13 DIAGNOSIS — Z8661 Personal history of infections of the central nervous system: Secondary | ICD-10-CM

## 2022-10-13 DIAGNOSIS — Z886 Allergy status to analgesic agent status: Secondary | ICD-10-CM

## 2022-10-13 DIAGNOSIS — M869 Osteomyelitis, unspecified: Secondary | ICD-10-CM

## 2022-10-13 DIAGNOSIS — M4640 Discitis, unspecified, site unspecified: Principal | ICD-10-CM

## 2022-10-13 HISTORY — DX: Osteomyelitis of vertebra, lumbar region: M46.26

## 2022-10-13 LAB — COMPREHENSIVE METABOLIC PANEL
ALT: 46 U/L — ABNORMAL HIGH (ref 0–44)
AST: 33 U/L (ref 15–41)
Albumin: 3.6 g/dL (ref 3.5–5.0)
Alkaline Phosphatase: 42 U/L (ref 38–126)
Anion gap: 9 (ref 5–15)
BUN: 16 mg/dL (ref 6–20)
CO2: 19 mmol/L — ABNORMAL LOW (ref 22–32)
Calcium: 9 mg/dL (ref 8.9–10.3)
Chloride: 107 mmol/L (ref 98–111)
Creatinine, Ser: 1.16 mg/dL (ref 0.61–1.24)
GFR, Estimated: >60 mL/min (ref >60)
Glucose, Bld: 400 mg/dL — ABNORMAL HIGH (ref 70–99)
Potassium: 4 mmol/L (ref 3.5–5.1)
Sodium: 135 mmol/L (ref 135–145)
Total Bilirubin: 0.6 mg/dL (ref 0.3–1.2)
Total Protein: 7.1 g/dL (ref 6.5–8.1)

## 2022-10-13 LAB — SEDIMENTATION RATE: Sed Rate: 1 mm/h (ref 0–16)

## 2022-10-13 LAB — I-STAT CG4 LACTIC ACID, ED
Lactic Acid, Venous: 4 mmol/L (ref 0.5–1.9)
Lactic Acid, Venous: 1.5 mmol/L (ref 0.5–1.9)

## 2022-10-13 LAB — CBG MONITORING, ED: Glucose-Capillary: 122 mg/dL — ABNORMAL HIGH (ref 70–99)

## 2022-10-13 LAB — CBC
HCT: 47 % (ref 39.0–52.0)
Hemoglobin: 14.9 g/dL (ref 13.0–17.0)
MCH: 31.1 pg (ref 26.0–34.0)
MCHC: 31.7 g/dL (ref 30.0–36.0)
MCV: 98.1 fL (ref 80.0–100.0)
Platelets: 221 10*3/uL (ref 150–400)
RBC: 4.79 MIL/uL (ref 4.22–5.81)
RDW: 15.2 % (ref 11.5–15.5)
WBC: 3.3 10*3/uL — ABNORMAL LOW (ref 4.0–10.5)
nRBC: 0 % (ref 0.0–0.2)

## 2022-10-13 LAB — C-REACTIVE PROTEIN: CRP: 0.6 mg/dL (ref <1.0)

## 2022-10-13 LAB — GLUCOSE, CAPILLARY: Glucose-Capillary: 190 mg/dL — ABNORMAL HIGH (ref 70–99)

## 2022-10-13 MED ORDER — CYCLOBENZAPRINE HCL 10 MG PO TABS
10.0000 mg | ORAL_TABLET | Freq: Two times a day (BID) | ORAL | Status: DC | PRN
  Administered 2022-10-13 – 2022-10-15 (×3): 10 mg via ORAL
  Filled 2022-10-13 (×4): qty 1

## 2022-10-13 MED ORDER — ONDANSETRON HCL 4 MG PO TABS: 4.0000 mg | ORAL_TABLET | Freq: Four times a day (QID) | ORAL | Status: DC | PRN

## 2022-10-13 MED ORDER — INSULIN ASPART 100 UNIT/ML IJ SOLN: 0.0000 [IU] | Freq: Every day | INTRAMUSCULAR | Status: DC

## 2022-10-13 MED ORDER — LORAZEPAM 2 MG/ML IJ SOLN
1.0000 mg | INTRAMUSCULAR | Status: DC | PRN
  Administered 2022-10-13: 1 mg via INTRAVENOUS
  Filled 2022-10-13: qty 1

## 2022-10-13 MED ORDER — LACTATED RINGERS IV BOLUS (SEPSIS)
1000.0000 mL | Freq: Once | INTRAVENOUS | Status: AC
  Administered 2022-10-13: 1000 mL via INTRAVENOUS

## 2022-10-13 MED ORDER — ONDANSETRON HCL 4 MG/2ML IJ SOLN: 4.0000 mg | Freq: Four times a day (QID) | INTRAMUSCULAR | Status: DC | PRN

## 2022-10-13 MED ORDER — SODIUM CHLORIDE 0.9 % IV SOLN
2.0000 g | Freq: Once | INTRAVENOUS | Status: AC
  Administered 2022-10-13: 2 g via INTRAVENOUS
  Filled 2022-10-13: qty 12.5

## 2022-10-13 MED ORDER — ENOXAPARIN SODIUM 40 MG/0.4ML IJ SOSY
40.0000 mg | PREFILLED_SYRINGE | INTRAMUSCULAR | Status: DC
  Administered 2022-10-15: 40 mg via SUBCUTANEOUS
  Filled 2022-10-13 (×2): qty 0.4

## 2022-10-13 MED ORDER — INSULIN ASPART 100 UNIT/ML IJ SOLN
0.0000 [IU] | Freq: Three times a day (TID) | INTRAMUSCULAR | Status: DC
  Administered 2022-10-14: 2 [IU] via SUBCUTANEOUS
  Administered 2022-10-14 – 2022-10-15 (×3): 3 [IU] via SUBCUTANEOUS
  Administered 2022-10-15: 2 [IU] via SUBCUTANEOUS
  Administered 2022-10-16: 3 [IU] via SUBCUTANEOUS

## 2022-10-13 MED ORDER — DULOXETINE HCL 60 MG PO CPEP
60.0000 mg | ORAL_CAPSULE | Freq: Every day | ORAL | Status: DC
  Administered 2022-10-13 – 2022-10-16 (×4): 60 mg via ORAL
  Filled 2022-10-13 (×3): qty 1
  Filled 2022-10-13: qty 2

## 2022-10-13 MED ORDER — NICOTINE 21 MG/24HR TD PT24
21.0000 mg | MEDICATED_PATCH | Freq: Every day | TRANSDERMAL | Status: DC
  Administered 2022-10-13: 21 mg via TRANSDERMAL
  Filled 2022-10-13: qty 1

## 2022-10-13 MED ORDER — HYDROXYZINE HCL 25 MG PO TABS
50.0000 mg | ORAL_TABLET | Freq: Two times a day (BID) | ORAL | Status: DC | PRN
  Administered 2022-10-14 (×2): 50 mg via ORAL
  Filled 2022-10-13 (×2): qty 2

## 2022-10-13 MED ORDER — NICOTINE 21 MG/24HR TD PT24
21.0000 mg | MEDICATED_PATCH | Freq: Every day | TRANSDERMAL | Status: DC
  Administered 2022-10-14 – 2022-10-16 (×3): 21 mg via TRANSDERMAL
  Filled 2022-10-13 (×3): qty 1

## 2022-10-13 MED ORDER — VANCOMYCIN HCL 1500 MG/300ML IV SOLN
1500.0000 mg | Freq: Once | INTRAVENOUS | Status: AC
  Administered 2022-10-13: 1500 mg via INTRAVENOUS
  Filled 2022-10-13: qty 300

## 2022-10-13 MED ORDER — GADOBUTROL 1 MMOL/ML IV SOLN
7.5000 mL | Freq: Once | INTRAVENOUS | Status: AC | PRN
  Administered 2022-10-13: 7.5 mL via INTRAVENOUS

## 2022-10-13 MED ORDER — MELATONIN 5 MG PO TABS
10.0000 mg | ORAL_TABLET | Freq: Every day | ORAL | Status: DC
  Administered 2022-10-13 – 2022-10-15 (×3): 10 mg via ORAL
  Filled 2022-10-13 (×4): qty 2

## 2022-10-13 MED ORDER — PRAZOSIN HCL 1 MG PO CAPS
1.0000 mg | ORAL_CAPSULE | Freq: Every day | ORAL | Status: DC
  Administered 2022-10-13 – 2022-10-15 (×3): 1 mg via ORAL
  Filled 2022-10-13 (×4): qty 1

## 2022-10-13 MED ORDER — OXYCODONE HCL 5 MG PO TABS
5.0000 mg | ORAL_TABLET | ORAL | Status: DC | PRN
  Administered 2022-10-13 – 2022-10-14 (×2): 5 mg via ORAL
  Filled 2022-10-13 (×2): qty 1

## 2022-10-13 MED ORDER — SENNOSIDES-DOCUSATE SODIUM 8.6-50 MG PO TABS: 1.0000 | ORAL_TABLET | Freq: Every evening | ORAL | Status: DC | PRN

## 2022-10-13 MED ORDER — HYDROMORPHONE HCL 1 MG/ML IJ SOLN: 1.0000 mg | Freq: Once | INTRAMUSCULAR | Status: AC

## 2022-10-13 MED ORDER — LACTATED RINGERS IV BOLUS (SEPSIS)
250.0000 mL | Freq: Once | INTRAVENOUS | Status: AC
  Administered 2022-10-13: 250 mL via INTRAVENOUS

## 2022-10-13 MED ORDER — HYDROMORPHONE HCL 1 MG/ML IJ SOLN
INTRAMUSCULAR | Status: AC
  Administered 2022-10-13: 1 mg via INTRAVENOUS
  Filled 2022-10-13: qty 1

## 2022-10-13 MED ORDER — LACTATED RINGERS IV SOLN: INTRAVENOUS | Status: DC

## 2022-10-13 MED ORDER — HYDROMORPHONE HCL 1 MG/ML IJ SOLN
1.0000 mg | Freq: Once | INTRAMUSCULAR | Status: AC
  Administered 2022-10-13: 1 mg via INTRAVENOUS
  Filled 2022-10-13: qty 1

## 2022-10-13 MED ORDER — BUPRENORPHINE HCL-NALOXONE HCL 2-0.5 MG SL SUBL
1.0000 | SUBLINGUAL_TABLET | Freq: Two times a day (BID) | SUBLINGUAL | Status: DC
  Administered 2022-10-13: 1 via SUBLINGUAL
  Filled 2022-10-13: qty 1

## 2022-10-13 MED ORDER — HYDROMORPHONE HCL 1 MG/ML IJ SOLN
2.0000 mg | Freq: Once | INTRAMUSCULAR | Status: AC
  Administered 2022-10-13: 2 mg via INTRAVENOUS
  Filled 2022-10-13: qty 2

## 2022-10-13 MED ORDER — DIVALPROEX SODIUM 250 MG PO DR TAB
250.0000 mg | DELAYED_RELEASE_TABLET | Freq: Every day | ORAL | Status: DC
  Administered 2022-10-13 – 2022-10-15 (×3): 250 mg via ORAL
  Filled 2022-10-13 (×3): qty 1

## 2022-10-13 MED ORDER — ONDANSETRON HCL 4 MG/2ML IJ SOLN
4.0000 mg | Freq: Once | INTRAMUSCULAR | Status: AC
  Administered 2022-10-13: 4 mg via INTRAVENOUS
  Filled 2022-10-13: qty 2

## 2022-10-13 MED ORDER — QUETIAPINE FUMARATE 100 MG PO TABS
100.0000 mg | ORAL_TABLET | Freq: Every day | ORAL | Status: DC
  Administered 2022-10-13 – 2022-10-15 (×3): 100 mg via ORAL
  Filled 2022-10-13 (×3): qty 1

## 2022-10-13 MED ORDER — HYDROMORPHONE HCL 1 MG/ML IJ SOLN: 2.0000 mg | INTRAMUSCULAR | Status: DC | PRN

## 2022-10-13 MED ORDER — HYDROMORPHONE HCL 1 MG/ML IJ SOLN
0.5000 mg | Freq: Once | INTRAMUSCULAR | Status: AC
  Administered 2022-10-13: 0.5 mg via INTRAVENOUS
  Filled 2022-10-13: qty 1

## 2022-10-13 MED ORDER — HYDROMORPHONE HCL 1 MG/ML IJ SOLN
1.0000 mg | INTRAMUSCULAR | Status: DC | PRN
  Administered 2022-10-14 – 2022-10-16 (×12): 1 mg via INTRAVENOUS
  Filled 2022-10-13 (×13): qty 1

## 2022-10-13 NOTE — ED Notes (Signed)
Pt left unit to go outside and smoke. Security walked patient back into the unit as we were on lockdown at the time.

## 2022-10-13 NOTE — ED Triage Notes (Signed)
Pt c/o lower back [id number]. Pt states last used IV drugs a few months ago. Pt was able to walk to triage. PT denies injury. Pt denies numbness and tingling. Pt denies loss of bowel or bladder.

## 2022-10-13 NOTE — ED Notes (Signed)
ED TO INPATIENT HANDOFF REPORT  ED Nurse Name and Phone #: [phone]  S Name/Age/Gender Joshua Thomas 47 y.o. male Room/Bed: 006C/006C  Code Status   Code Status: Full Code  Home/SNF/Other Home Patient oriented to: self, place, time, and situation Is this baseline? 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 Pt states last used IV drugs a few months ago. Pt was able to walk to triage. PT denies injury. Pt denies numbness and tingling. Pt denies loss of bowel or bladder.   Allergies Allergies  Allergen Reactions   Tylenol [Acetaminophen] Other (See Comments)    Due to Ulcers/ Liver Damage   Morphine Itching    Level of Care/Admitting Diagnosis ED Disposition     ED Disposition  Admit   Condition  --   Comment  Hospital Area: [facility] [[id number]]  Level of Care: Med-Surg [16]  May admit patient to [facility] or [facility] if equivalent level of care is available:: No  Covid Evaluation: Asymptomatic - no recent exposure (last 10 days) testing not required  Diagnosis: Osteomyelitis of lumbar spine [facility]) [[id number]]  Admitting Physician: Gery Pray [[id number]]  Attending Physician: Gery Pray [[id number]]  Certification:: I certify this patient will need inpatient services for at least 2 midnights  Expected Medical Readiness: 10/15/2022          B Medical/Surgery History 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   COPD (chronic obstructive pulmonary disease) (HCC)    DDD (degenerative disc disease), lumbar    Degenerative disc disease, lumbar    Depression    Diabetes mellitus without complication (HCC)    Discitis    GERD (gastroesophageal  reflux disease)    Hepatitis C    Hepatitis C    History of kidney stones    History of stomach ulcers    Hx of diabetes mellitus    due to infection   Hypertension    Lung nodules    3 on L and 2 on R   PTSD (post-traumatic stress disorder)    Sciatica    Substance abuse (HCC)    Past Surgical History:  Procedure Laterality Date   FRACTURE SURGERY     HARDWARE REMOVAL  01/27/2012   Procedure: HARDWARE REMOVAL;  Surgeon: Kathryne Hitch, MD;  Location: [facility];  Service: Orthopedics;  Laterality: Left;   IR LUMBAR DISC ASPIRATION W/IMG GUIDE  06/08/2017   IR LUMBAR DISC ASPIRATION W/IMG GUIDE  07/06/2021   LAPAROSCOPIC LYSIS OF ADHESIONS  05/24/2022   Procedure: LAPAROSCOPIC LYSIS OF ADHESIONS;  Surgeon: Franky Macho, MD;  Location: [facility];  Service: General;;   RADIOLOGY WITH ANESTHESIA N/A 06/08/2017   Procedure: DISC ASPIRATION;  Surgeon: Julieanne Cotton, MD;  Location: [facility];  Service: Radiology;  Laterality: N/A;   TIBIA IM NAIL INSERTION  01/27/2012   Procedure: INTRAMEDULLARY (IM) NAIL TIBIAL;  Surgeon: Kathryne Hitch, MD;  Location: [facility];  Service: Orthopedics;  Laterality: Left;  Removal of IM Rod and Screws Left Tibia with Exchange Nail, Allograft Bone Graft Left Tibia   XI ROBOTIC ASSISTED INGUINAL HERNIA REPAIR WITH MESH Right 05/24/2022   Procedure:  XI ROBOTIC ASSISTED INGUINAL HERNIA REPAIR WITH MESH;  Surgeon: Franky Macho, MD;  Location: [facility];  Service: General;  Laterality: Right;     A IV Location/Drains/Wounds Patient Lines/Drains/Airways Status     Active Line/Drains/Airways     Name Placement date Placement time Site Days   Peripheral IV 10/13/22 20 G 1" Anterior;Proximal;Right Forearm 10/13/22  1655  Forearm  less than 1   Incision - 3 Ports Abdomen 1: Mid;Upper 2: Right;Upper 3: Left;Upper 05/24/22  1050  -- 142            Intake/Output Last 24 hours  Intake/Output Summary (Last 24 hours) at 10/13/2022 2237 Last data filed at  10/13/2022 2152 Gross per 24 hour  Intake 2550 ml  Output --  Net 2550 ml    Labs/Imaging Results for orders placed or performed during the hospital encounter of 10/13/22 (from the past 48 hour(s))  CBC     Status: Abnormal   Collection Time: 10/13/22  3:16 PM  Result Value Ref Range   WBC 3.3 (L) 4.0 - 10.5 K/uL   RBC 4.79 4.22 - 5.81 MIL/uL   Hemoglobin 14.9 13.0 - 17.0 g/dL   HCT 16.1 09.6 - 04.5 %   MCV 98.1 80.0 - 100.0 fL   MCH 31.1 26.0 - 34.0 pg   MCHC 31.7 30.0 - 36.0 g/dL   RDW 40.9 81.1 - 91.4 %   Platelets 221 150 - 400 K/uL   nRBC 0.0 0.0 - 0.2 %    Comment: Performed at [facility], [address], [place], [place] [address]  Comprehensive metabolic panel     Status: Abnormal   Collection Time: 10/13/22  3:16 PM  Result Value Ref Range   Sodium 135 135 - 145 mmol/L   Potassium 4.0 3.5 - 5.1 mmol/L   Chloride 107 98 - 111 mmol/L   CO2 19 (L) 22 - 32 mmol/L   Glucose, Bld 400 (H) 70 - 99 mg/dL    Comment: Glucose reference range applies only to samples taken after fasting for at least 8 hours.   BUN 16 6 - 20 mg/dL   Creatinine, Ser 6.21 0.61 - 1.24 mg/dL   Calcium 9.0 8.9 - 30.8 mg/dL   Total Protein 7.1 6.5 - 8.1 g/dL   Albumin 3.6 3.5 - 5.0 g/dL   AST 33 15 - 41 U/L   ALT 46 (H) 0 - 44 U/L   Alkaline Phosphatase 42 38 - 126 U/L   Total Bilirubin 0.6 0.3 - 1.2 mg/dL   GFR, Estimated >65 >78 mL/min    Comment: (NOTE) Calculated using the CKD-EPI Creatinine Equation (2021)    Anion gap 9 5 - 15    Comment: Performed at [facility], [address] [place], [place] [address]  Sedimentation rate     Status: None   Collection Time: 10/13/22  3:16 PM  Result Value Ref Range   Sed Rate 1 0 - 16 mm/hr    Comment: Performed at [facility], [address] [place], [place] [address]  C-reactive protein     Status: None   Collection Time: 10/13/22  3:16 PM  Result Value Ref Range   CRP 0.6 <1.0 mg/dL    Comment: Performed at [facility], [address] [place], [place] [address]  I-Stat CG4 Lactic Acid     Status: Abnormal   Collection Time: 10/13/22  3:24 PM  Result Value Ref Range  Lactic Acid, Venous 4.0 (HH) 0.5 - 1.9 mmol/L   Comment NOTIFIED PHYSICIAN   I-Stat CG4 Lactic Acid     Status: None   Collection Time: 10/13/22  4:59 PM  Result Value Ref Range   Lactic Acid, Venous 1.5 0.5 - 1.9 mmol/L  POC CBG, ED     Status: Abnormal   Collection Time: 10/13/22  9:36 PM  Result Value Ref Range   Glucose-Capillary 122 (H) 70 - 99 mg/dL    Comment: Glucose reference range applies only to samples taken after fasting for at least 8 hours.   MR Lumbar Spine W Wo Contrast  Result Date: 10/13/2022 CLINICAL DATA:  Acute myelopathy.  History of discitis EXAM: MRI LUMBAR SPINE WITHOUT AND WITH CONTRAST TECHNIQUE: Multiplanar and multiecho pulse sequences of the lumbar spine were obtained without and with intravenous contrast. CONTRAST:  7.55mL GADAVIST GADOBUTROL 1 MMOL/ML IV SOLN COMPARISON:  Lumbar spine MRI dated 10/08/21. FINDINGS: Segmentation:  Standard. Alignment:  Trace retrolisthesis of L1 on L2. Vertebrae: Compared to prior exam there has been an interval left hemilaminectomy at L5 and drainage of the epidural abscess noted on prior exam. Redemonstrated are findings of discitis osteomyelitis at L5-S1 which appear similar to prior exam. There is persistent small volume contrast-enhancing material within the ventral epidural space at the L5-S1 level, which could represent postsurgical change or residual infection. 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 Compared to prior exam there is interval increase in contrast enhancement along the left lateral aspect of the L2 vertebral body (series 8, image 13), which could represent an additional site of infection. Conus medullaris and cauda  equina: Conus extends to the L2 level. On the sagittal T1 weighted post contrast-enhanced sequence there appears to be mild contrast enhancement along the surface of the spinal cord as well as along the cauda equina nerve roots (series 8, image 9-12). These findings raise the possibility for arachnoiditis and/or meningitis. Paraspinal and other soft tissues: Negative. Disc levels: T11-T12: Moderate left and mild right facet degenerative change. Ligamentum flavum hypertrophy. No spinal canal narrowing. No significant neural foraminal narrowing. T12-L1: Mild-to-moderate bilateral facet degenerative change. No significant disc bulge. No spinal canal narrowing. No neural foraminal narrowing. L1-L2: Central disc protrusion. Mild-to-moderate bilateral facet degenerative change. Mild spinal canal narrowing. Moderate to severe bilateral neural foraminal narrowing. L2-L3: Mild-to-moderate bilateral facet degenerative change. Redemonstrated contrast-enhancing material in the ventral epidural space. Mild overall spinal canal narrowing. Moderate to severe right and moderate left neural foraminal narrowing. L3-L4: Moderate bilateral facet degenerative change. Eccentric left disc bulge. No spinal canal narrowing. Moderate left and mild right neural foraminal narrowing. L4-L5: Moderate bilateral facet degenerative change. No significant disc bulge. No spinal canal narrowing. Moderate right and mild-to-moderate left neural foraminal narrowing. L5-S1: Left hemilaminotomy. Moderate bilateral facet degenerative change. No spinal canal narrowing. Mild right and severe left neural foraminal narrowing. IMPRESSION: 1. Compared to prior exam there has been an interval left hemilaminectomy at L5 and drainage of the epidural abscess/phlegmon noted on prior exam. Redemonstrated are findings of discitis osteomyelitis at L5-S1 which appear similar to prior exam. There is persistent small volume contrast-enhancing material within the ventral  epidural space at the L5-S1 level, which could represent postsurgical change or residual infection. 2. 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 Compared  to prior exam there is interval increase in contrast enhancement along the left lateral aspect of the L2 vertebral body, which could represent a site of acute on chronic infection. 3. On the sagittal T1 weighted post contrast-enhanced sequence there appears to be new contrast enhancement along the surface of the spinal cord, as well as along the cauda equina nerve roots. These findings raise the possibility for arachnoiditis, which may be infectious or post-surgical. 4. Multilevel degenerative changes of the lumbar spine as described above. Electronically Signed   By: Lorenza Cambridge M.D.   On: 10/13/2022 20:17    Pending Labs Unresulted Labs (From admission, onward)     Start     Ordered   10/20/22 0500  Creatinine, serum  (enoxaparin (LOVENOX)    CrCl >/= 30 ml/min)  Weekly,   R     Comments: while on enoxaparin therapy    10/13/22 2229   10/14/22 0500  Comprehensive metabolic panel  Tomorrow morning,   R        10/13/22 2229   10/14/22 0500  CBC with Differential/Platelet  Tomorrow morning,   R        10/13/22 2229   10/13/22 2231  Hepatitis panel, acute  Once,   R        10/13/22 2230   10/13/22 2231  Rapid urine drug screen (hospital performed)  ONCE - STAT,   STAT        10/13/22 2230   10/13/22 2229  HIV Antibody (routine testing w rflx)  (HIV Antibody (Routine testing w reflex) panel)  Once,   R        10/13/22 2229   10/13/22 1456  Blood culture (routine x 2)  BLOOD CULTURE X 2,   R (with STAT occurrences)      10/13/22 1458            Vitals/Pain Today's Vitals   10/13/22 1450 10/13/22 1454 10/13/22 1929  BP: (!) 136/100  (!) 173/106  Pulse: (!) 104  88  Resp: (!) 22  20  Temp: 97.6 F (36.4 C)  98.4 F  (36.9 C)  TempSrc: Oral  Oral  SpO2: 100%  97%  Weight:  160 lb 0.9 oz (72.6 kg)   Height:  5\' 3"  (1.6 m)   PainSc:  10-Worst pain ever     Isolation Precautions No active isolations  Medications Medications  LORazepam (ATIVAN) injection 1 mg (1 mg Intravenous Given 10/13/22 1756)  lactated ringers infusion ( Intravenous New Bag/Given 10/13/22 2008)  insulin aspart (novoLOG) injection 0-15 Units (has no administration in time range)  insulin aspart (novoLOG) injection 0-5 Units (has no administration in time range)  enoxaparin (LOVENOX) injection 40 mg (has no administration in time range)  senna-docusate (Senokot-S) tablet 1 tablet (has no administration in time range)  ondansetron (ZOFRAN) tablet 4 mg (has no administration in time range)    Or  ondansetron (ZOFRAN) injection 4 mg (has no administration in time range)  oxyCODONE (Oxy IR/ROXICODONE) immediate release tablet 5 mg (has no administration in time range)  QUEtiapine (SEROQUEL) tablet 100 mg (has no administration in time range)  cyclobenzaprine (FLEXERIL) tablet 10 mg (has no administration in time range)  divalproex (DEPAKOTE) DR tablet 250 mg (has no administration in time range)  hydrOXYzine (VISTARIL) capsule 50 mg (has no administration in time range)  Melatonin TABS 10 mg (has no administration in time range)  buprenorphine-naloxone (SUBOXONE) 2-0.5 mg per SL tablet 1 tablet (has no administration in  time range)  nicotine (NICODERM CQ - dosed in mg/24 hours) patch 21 mg (has no administration in time range)  prazosin (MINIPRESS) capsule 1 mg (has no administration in time range)  DULoxetine (CYMBALTA) DR capsule 60 mg (has no administration in time range)  HYDROmorphone (DILAUDID) injection 1 mg (has no administration in time range)  HYDROmorphone (DILAUDID) injection 1 mg (1 mg Intravenous Given 10/13/22 1700)  ondansetron (ZOFRAN) injection 4 mg (4 mg Intravenous Given 10/13/22 1700)  lactated ringers bolus 1,000  mL (0 mLs Intravenous Stopped 10/13/22 1935)    And  lactated ringers bolus 1,000 mL (0 mLs Intravenous Stopped 10/13/22 2008)    And  lactated ringers bolus 250 mL (0 mLs Intravenous Stopped 10/13/22 1730)  ceFEPIme (MAXIPIME) 2 g in sodium chloride 0.9 % 100 mL IVPB (0 g Intravenous Stopped 10/13/22 1756)  vancomycin (VANCOREADY) IVPB 1500 mg/300 mL (0 mg Intravenous Stopped 10/13/22 2152)  HYDROmorphone (DILAUDID) injection 1 mg (1 mg Intravenous Given 10/13/22 1837)  gadobutrol (GADAVIST) 1 MMOL/ML injection 7.5 mL (7.5 mLs Intravenous Contrast Given 10/13/22 1900)  HYDROmorphone (DILAUDID) injection 0.5 mg (0.5 mg Intravenous Given 10/13/22 2211)    Mobility walks     Focused Assessments     R Recommendations: See Admitting Provider Note  Report given to:   Additional Notes:

## 2022-10-13 NOTE — Progress Notes (Signed)
ED Pharmacy Antibiotic Sign Off An antibiotic consult was received from an ED provider for cefepime and vancomycin per pharmacy dosing for  osteomyelitis . A chart review was completed to assess appropriateness.  The following one time order(s) were placed per pharmacy consult:  cefepime 2 g x 1 dose vancomycin 1500 mg x 1 dose  Further antibiotic and/or antibiotic pharmacy consults should be ordered by the admitting provider if indicated.   Thank you for allowing pharmacy to be a part of this patient's care.   Stephenie Acres, PharmD PGY1 Pharmacy Resident 10/13/2022 4:56 PM

## 2022-10-13 NOTE — ED Provider Triage Note (Signed)
Emergency Medicine Provider Triage Evaluation Note  Joshua Thomas , a 47 y.o. male  was evaluated in triage.  Pt complains of low back pain x 3 days. Hx of epidural abscess and IV drug use. Had surgery on back in October 2023 and followed up with infectious disease (details below).   No injury to the back. Pain feels burning and sharp, does not radiate.  Review of Systems  Positive: Low back pain Negative: Fevers, chills, numbness, weakness, saddle parethesias, urine retention, urine or bowel incontinence  Physical Exam  BP (!) 136/100 (BP Location: Right Arm)   Pulse (!) 104   Temp 97.6 F (36.4 C) (Oral)   Resp (!) 22   Ht 5\' 3"  (1.6 m)   Wt 72.6 kg   SpO2 100%   BMI 28.35 kg/m  Gen:   Awake, no distress   Resp:  Normal effort  MSK:   Moves extremities without difficulty  Other:  Tachycardic, appears uncomfortable  Medical Decision Making  Medically screening exam initiated at 2:55 PM.  Appropriate orders placed.  Naida Sleight was informed that the remainder of the evaluation will be completed by another provider, this initial triage assessment does not replace that evaluation, and the importance of remaining in the ED until their evaluation is complete.  Per chart review: [facility] Infectious disease Nov 2023 --- "epidural abscess 10/2021. He had completed 6 weeks of cipro + linezolid 06/2021-08/2021 at OSH and appears this was empiric treatment. He had then been in [facility] and completed treatment then to OSH with worsening back pain. MRI showing L5-S1 discitis/OM With prevertebral and ventral epidural abscesses. Taken to OR on 10/18/21 with matrix discectomy of L5-S1 with drain placement. Despite purulence being encountered cultures were negative. He was discharged on IV vanc and cefepime for plan for 8 weeks through 12/13/21. "   Varshini Arrants T, PA-C 10/13/22 1458

## 2022-10-13 NOTE — ED Provider Notes (Signed)
[place] EMERGENCY DEPARTMENT AT [facility] Provider Note   CSN: [id number] Arrival date & time: 10/13/22  1444     History  Chief Complaint  Patient presents with   Back Pain    Joshua Thomas is a 47 y.o. male.  47 year old with past medical history of discitis and IV drug abuse in the past presenting to the emergency department today with low back pain.  The patient states this is in the same spot where he had discitis in the past.  It feels very similar.  He does report some subjective fevers and chills but has not checked his temperature.  He states that he did sign out AGAINST MEDICAL ADVICE from his skilled nursing facility months ago prior to completing his full antibiotic regimen.  He denies any recent drug use.  He denies any focal weakness, numbness, or tingling.  He reports the pain is in his lower back and does not radiate.   Back Pain      Home Medications Prior to Admission medications   Medication Sig Start Date End Date Taking? Authorizing Provider  Buprenorphine HCl-Naloxone HCl (SUBOXONE) 8-2 MG FILM Place 1 Film under the tongue 2 (two) times daily.   Yes [provider]  Calcium Carbonate Antacid (TUMS PO) Take 2-3 tablets by mouth daily as needed (gerd).   Yes [provider]  cyclobenzaprine (FLEXERIL) 10 MG tablet Take 10 mg by mouth 2 (two) times daily as needed for muscle spasms.   Yes [provider]  divalproex (DEPAKOTE) 250 MG DR tablet Take 250 mg by mouth at bedtime. 03/09/22  Yes [provider]  hydrOXYzine (VISTARIL) 50 MG capsule Take 50 mg by mouth 2 (two) times daily as needed for anxiety.   Yes [provider]  Melatonin 10 MG TABS Take 10 mg by mouth at bedtime.   Yes [provider]  naloxone [facility]) nasal spray 4 mg/0.1 mL Take in case of overdose 08/07/20  Yes Long, Arlyss Repress, MD  prazosin (MINIPRESS) 1 MG capsule Take 1 mg by mouth at bedtime. 07/31/21  Yes [provider]  QUEtiapine (SEROQUEL) 100 MG tablet Take 100 mg by mouth at bedtime.   Yes [provider]  nicotine (NICODERM CQ - DOSED IN MG/24 HOURS) 21 mg/24hr patch Place 1 patch (21 mg total) onto the skin daily. Patient not taking: Reported on 10/13/2022 09/17/22   Lance Muss, MD  sertraline (ZOLOFT) 50 MG tablet Take 100 mg by mouth daily. Patient not taking: Reported on 10/13/2022    [provider]      Allergies    Tylenol [acetaminophen] and Morphine    Review of Systems   Review of Systems  Musculoskeletal:  Positive for back pain.  All other systems reviewed and are negative.   Physical Exam Updated Vital Signs BP (!) 173/106 (BP Location: Left Arm)   Pulse 88   Temp 98.4 F (36.9 C) (Oral)   Resp 20   Ht 5\' 3"  (1.6 m)   Wt 72.6 kg   SpO2 97%   BMI 28.35 kg/m  Physical Exam Vitals and nursing note reviewed.   Gen: Appears uncomfortable and cannot get in a comfortable position Eyes: PERRL, EOMI HEENT: no oropharyngeal swelling Neck: trachea midline Resp: clear to auscultation bilaterally Card: RRR, no murmurs, rubs, or gallops Abd: nontender, nondistended Extremities: no calf tenderness, no edema MSK: Tender over the lumbar spine with old appearing surgical scar with no overlying erythema Vascular:  2+ radial pulses bilaterally, 2+ DP pulses bilaterally Neuro: Equal strength and sensation throughout bilateral lower extremities with normal patellar and Achilles reflex bilaterally Skin: no rashes Psyc: acting appropriately   ED Results / Procedures / Treatments   Labs (all labs ordered are listed, but only abnormal results are displayed) Labs Reviewed  CBC - Abnormal; Notable for the following components:      Result Value   WBC 3.3 (*)    All other components within normal limits  COMPREHENSIVE METABOLIC PANEL - Abnormal; Notable for the following components:   CO2 19 (*)    Glucose, Bld 400 (*)    ALT 46 (*)    All other  components within normal limits  I-STAT CG4 LACTIC ACID, ED - Abnormal; Notable for the following components:   Lactic Acid, Venous 4.0 (*)    All other components within normal limits  CULTURE, BLOOD (ROUTINE X 2)  CULTURE, BLOOD (ROUTINE X 2)  SEDIMENTATION RATE  C-REACTIVE PROTEIN  I-STAT CG4 LACTIC ACID, ED  CBG MONITORING, ED    EKG None  Radiology MR Lumbar Spine W Wo Contrast  Result Date: 10/13/2022 CLINICAL DATA:  Acute myelopathy.  History of discitis EXAM: MRI LUMBAR SPINE WITHOUT AND WITH CONTRAST TECHNIQUE: Multiplanar and multiecho pulse sequences of the lumbar spine were obtained without and with intravenous contrast. CONTRAST:  7.81mL GADAVIST GADOBUTROL 1 MMOL/ML IV SOLN COMPARISON:  Lumbar spine MRI dated 10/08/21. FINDINGS: Segmentation:  Standard. Alignment:  Trace retrolisthesis of L1 on L2. Vertebrae: Compared to prior exam there has been an interval left hemilaminectomy at L5 and drainage of the epidural abscess noted on prior exam. Redemonstrated are findings of discitis osteomyelitis at L5-S1 which appear similar to prior exam. There is persistent small volume contrast-enhancing material within the ventral epidural space at the L5-S1 level, which could represent postsurgical change or residual infection. 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 Compared to prior exam there is interval increase in contrast enhancement along the left lateral aspect of the L2 vertebral body (series 8, image 13), which could represent an additional site of infection. Conus medullaris and cauda equina: Conus extends to the L2 level. On the sagittal T1 weighted post contrast-enhanced sequence there appears to be mild contrast enhancement along the surface of the spinal cord as well as along the cauda equina nerve roots (series 8, image 9-12). These  findings raise the possibility for arachnoiditis and/or meningitis. Paraspinal and other soft tissues: Negative. Disc levels: T11-T12: Moderate left and mild right facet degenerative change. Ligamentum flavum hypertrophy. No spinal canal narrowing. No significant neural foraminal narrowing. T12-L1: Mild-to-moderate bilateral facet degenerative change. No significant disc bulge. No spinal canal narrowing. No neural foraminal narrowing. L1-L2: Central disc protrusion. Mild-to-moderate bilateral facet degenerative change. Mild spinal canal narrowing. Moderate to severe bilateral neural foraminal narrowing. L2-L3: Mild-to-moderate bilateral facet degenerative change. Redemonstrated contrast-enhancing material in the ventral epidural space. Mild overall spinal canal narrowing. Moderate to severe right and moderate left neural foraminal narrowing. L3-L4: Moderate bilateral facet degenerative change. Eccentric left disc bulge. No spinal canal narrowing. Moderate left and mild right neural foraminal narrowing. L4-L5: Moderate bilateral facet degenerative change. No significant disc bulge. No spinal canal narrowing. Moderate right and mild-to-moderate left neural foraminal narrowing. L5-S1: Left hemilaminotomy. Moderate bilateral facet degenerative change. No spinal canal narrowing. Mild right and severe left neural foraminal narrowing. IMPRESSION: 1. Compared  to prior exam there has been an interval left hemilaminectomy at L5 and drainage of the epidural abscess/phlegmon noted on prior exam. Redemonstrated are findings of discitis osteomyelitis at L5-S1 which appear similar to prior exam. There is persistent small volume contrast-enhancing material within the ventral epidural space at the L5-S1 level, which could represent postsurgical change or residual infection. 2. 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  Compared to prior exam there is interval increase in contrast enhancement along the left lateral aspect of the L2 vertebral body, which could represent a site of acute on chronic infection. 3. On the sagittal T1 weighted post contrast-enhanced sequence there appears to be new contrast enhancement along the surface of the spinal cord, as well as along the cauda equina nerve roots. These findings raise the possibility for arachnoiditis, which may be infectious or post-surgical. 4. Multilevel degenerative changes of the lumbar spine as described above. Electronically Signed   By: Lorenza Cambridge M.D.   On: 10/13/2022 20:17    Procedures Procedures    Medications Ordered in ED Medications  LORazepam (ATIVAN) injection 1 mg (1 mg Intravenous Given 10/13/22 1756)  lactated ringers infusion ( Intravenous New Bag/Given 10/13/22 2008)  vancomycin (VANCOREADY) IVPB 1500 mg/300 mL (1,500 mg Intravenous New Bag/Given 10/13/22 1934)  HYDROmorphone (DILAUDID) injection 1 mg (1 mg Intravenous Given 10/13/22 1700)  ondansetron (ZOFRAN) injection 4 mg (4 mg Intravenous Given 10/13/22 1700)  lactated ringers bolus 1,000 mL (0 mLs Intravenous Stopped 10/13/22 1935)    And  lactated ringers bolus 1,000 mL (0 mLs Intravenous Stopped 10/13/22 2008)    And  lactated ringers bolus 250 mL (0 mLs Intravenous Stopped 10/13/22 1730)  ceFEPIme (MAXIPIME) 2 g in sodium chloride 0.9 % 100 mL IVPB (0 g Intravenous Stopped 10/13/22 1756)  HYDROmorphone (DILAUDID) injection 1 mg (1 mg Intravenous Given 10/13/22 1837)  gadobutrol (GADAVIST) 1 MMOL/ML injection 7.5 mL (7.5 mLs Intravenous Contrast Given 10/13/22 1900)    ED Course/ Medical Decision Making/ A&P                                 Medical Decision Making 47 year old male with past medical history of IV drug abuse, diabetes, hypertension, and discitis in the past presents the emergency department today with low back pain.  Given his history a sepsis workup is initiated.  The  patient does have an elevated lactic acid level.  He is covered with vancomycin and cefepime for possible discitis/osteomyelitis.  MRI is ordered.  Sepsis fluids were also ordered.  The patient given Dilaudid and Zofran for his symptoms.  I will further evaluate the patient after his initial workup is complete for ultimate disposition.  The patient's lactic acid is significantly elevated here.  Blood pressures remained stable.  The patient's blood pressure was excellently elevated side.  His blood sugar is elevated on initially but there were no signs of DKA.  MRI shows findings concerning for acute on chronic discitis/osteomyelitis.  This was called and discussed with neurosurgery.  They recommended medical admission but there is no indication for surgical intervention at this time.  A call was placed for admission.  Amount and/or Complexity of Data Reviewed Radiology: ordered.  Risk Prescription drug management. Decision regarding hospitalization.           Final Clinical Impression(s) / ED Diagnoses Final diagnoses:  Discitis, unspecified spinal region  Osteomyelitis, unspecified site, unspecified type [facility])    Rx / DC Orders ED Discharge Orders     None         Durwin Glaze, MD 10/13/22 2249

## 2022-10-13 NOTE — Progress Notes (Signed)
Pharmacy Antibiotic Note  Joshua Thomas is a 47 y.o. male admitted on 10/13/2022 with osteomyelitis.  Vancomycin and cefepime given in ED. Pharmacy has been consulted for vancomycin dosing.  Plan: {Assessment:21075}  Height: 5\' 3"  (160 cm) Weight: 72.6 kg (160 lb 0.9 oz) IBW/kg (Calculated) : 56.9  Temp (24hrs), Avg:98 F (36.7 C), Min:97.6 F (36.4 C), Max:98.4 F (36.9 C)  Recent Labs  Lab 10/13/22 1516 10/13/22 1524 10/13/22 1659  WBC 3.3*  --   --   CREATININE 1.16  --   --   LATICACIDVEN  --  4.0* 1.5    Estimated Creatinine Clearance: 70.4 mL/min (by C-G formula based on SCr of 1.16 mg/dL).    Allergies  Allergen Reactions   Tylenol [Acetaminophen] Other (See Comments)    Due to Ulcers/ Liver Damage   Morphine Itching    Antimicrobials this admission: *** *** >> *** *** *** >> ***  Dose adjustments this admission: ***  Microbiology results: *** BCx: *** *** UCx: ***  *** Sputum: ***  *** MRSA PCR: ***  Thank you for allowing pharmacy to be a part of this patient's care.  Marja Kays 10/13/2022 10:34 PM

## 2022-10-13 NOTE — Sepsis Progress Note (Signed)
eLink is following this Code Sepsis. °

## 2022-10-13 NOTE — H&P (Signed)
PCP:   Benita Stabile, MD   Chief Complaint:  Back pain  HPI: This a 47 year old male with past medical history of MDD, nightmares, bipolar disease, tobacco abuse, recurrent L5/S1 discitis/osteomyelitis, history of fentanyl, methamphetamine abuse, history of hep (spontaneously resolved).  Patient has had multiple admissions for lumbar osteomyelitis.  Initial admission 07/03/2021 -?  At that time treated with linezolid 600 mg twice daily and Cipro 750 mg twice daily for 6 weeks.  Per patient he did not complete therapy stopping antibiotics after he felt better.  Patient readmitted between home on and [place] system 9/28-10/01/2021, then 10/610/13/2023 with L5-S1 discitis/osteomyelitis with phlegmon and small abscess.  At [facility] (10/18/21) he was taken to the OR for matrix discectomy.  All cultures drawn both hospitalizations were negative.  During his second hospitalization he was treated with IV vancomycin and cefepime.  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 He was seen by ID in follow up, who stated in his note this could have been a contaminant but given patient's history he prescribed Cipro 750 mg p.o. twice daily and Bactrim DS 2 tablets p.o. twice daily to be taken for 6 months or longer.  Per patient he may have taken these medications to maybe 3 month.  He did not complete a 2-month course His 8 weeks IV cefepime and vancomycin course was completed (to ensure patient infection was not suboptimally treated).    Today he presents to [facility] with complaint of approximately 1 week low back pain, gradually worsening each day.  He denies fever but endorses mild chills.  He denies any urinary or bowel incontinence.  He states the pain is severe in his left hip.  Per patient he denies ongoing use of illicit drugs, admitting to a slip up once approximately a month ago.  UDS done 8/23 and 8/27 had positive for THC, barbiturates, cocaine, opiates.  In  our ER today patient's MRI L/S spine shows chronic discitis/osteomyelitis L5-S1 which could be postsurgical or residual infection and chronic discitis L2-3.  Neurosurgeon on-call at week was contacted requesting transfer.  MRI reviewed by neurosurgery team, no surgical intervention needed or planned.  Transfer declined Patient additionally has been out of his medication since moving here from [place] approximately 6 weeks ago.  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   COPD (chronic obstructive pulmonary disease) (HCC)    DDD (degenerative disc disease), lumbar    Degenerative disc disease, lumbar    Depression    Diabetes mellitus without complication (HCC)    Discitis    GERD (gastroesophageal reflux disease)    Hepatitis C    Hepatitis C    History of kidney stones    History of stomach ulcers    Hx of diabetes mellitus    due to infection   Hypertension    Lung nodules    3 on L and 2 on R   PTSD (post-traumatic stress disorder)    Sciatica    Substance abuse (HCC)    Past Surgical History:  Procedure Laterality Date   FRACTURE SURGERY     HARDWARE REMOVAL  01/27/2012   Procedure: HARDWARE REMOVAL;  Surgeon: Kathryne Hitch, MD;  Location: [facility];  Service: Orthopedics;  Laterality: Left;   IR LUMBAR DISC ASPIRATION W/IMG GUIDE  06/08/2017   IR LUMBAR DISC ASPIRATION W/IMG GUIDE  07/06/2021   LAPAROSCOPIC LYSIS OF ADHESIONS  05/24/2022   Procedure: LAPAROSCOPIC LYSIS OF ADHESIONS;  Surgeon: Franky Macho, MD;  Location: [facility];  Service: General;;   RADIOLOGY WITH ANESTHESIA N/A 06/08/2017   Procedure: DISC ASPIRATION;  Surgeon: Julieanne Cotton, MD;  Location: [facility] OR;  Service: Radiology;  Laterality: N/A;    TIBIA IM NAIL INSERTION  01/27/2012   Procedure: INTRAMEDULLARY (IM) NAIL TIBIAL;  Surgeon: Kathryne Hitch, MD;  Location: [facility];  Service: Orthopedics;  Laterality: Left;  Removal of IM Rod and Screws Left Tibia with Exchange Nail, Allograft Bone Graft Left Tibia   XI ROBOTIC ASSISTED INGUINAL HERNIA REPAIR WITH MESH Right 05/24/2022   Procedure: XI ROBOTIC ASSISTED INGUINAL HERNIA REPAIR WITH MESH;  Surgeon: Franky Macho, MD;  Location: [facility];  Service: General;  Laterality: Right;    Medications: Prior to Admission medications   Medication Sig Start Date End Date Taking? Authorizing Provider  Buprenorphine HCl-Naloxone HCl (SUBOXONE) 8-2 MG FILM Place 1 Film under the tongue 2 (two) times daily.   Yes [provider]  Calcium Carbonate Antacid (TUMS PO) Take 2-3 tablets by mouth daily as needed (gerd).   Yes [provider]  cyclobenzaprine (FLEXERIL) 10 MG tablet Take 10 mg by mouth 2 (two) times daily as needed for muscle spasms.   Yes [provider]  divalproex (DEPAKOTE) 250 MG DR tablet Take 250 mg by mouth at bedtime. 03/09/22  Yes [provider]  hydrOXYzine (VISTARIL) 50 MG capsule Take 50 mg by mouth 2 (two) times daily as needed for anxiety.   Yes [provider]  Melatonin 10 MG TABS Take 10 mg by mouth at bedtime.   Yes [provider]  naloxone [facility]) nasal spray 4 mg/0.1 mL Take in case of overdose 08/07/20  Yes Long, Arlyss Repress, MD  prazosin (MINIPRESS) 1 MG capsule Take 1 mg by mouth at bedtime. 07/31/21  Yes [provider]  QUEtiapine (SEROQUEL) 100 MG tablet Take 100 mg by mouth at bedtime.   Yes [provider]  nicotine (NICODERM CQ - DOSED IN MG/24 HOURS) 21 mg/24hr patch Place 1 patch (21 mg total) onto the skin daily. Patient not taking: Reported on 10/13/2022 09/17/22   Lance Muss, MD  sertraline (ZOLOFT) 50 MG tablet Take 100 mg by mouth daily. Patient not taking: Reported on 10/13/2022     [provider]    Allergies:   Allergies  Allergen Reactions   Tylenol [Acetaminophen] Other (See Comments)    Due to Ulcers/ Liver Damage   Morphine Itching    Social History:  reports that he has been smoking cigarettes. He has a 33 pack-year smoking history. He quit smokeless tobacco use about 16 years ago. He reports that he does not currently use alcohol after a past usage of about 1.0 standard drink of alcohol per week. He reports that he does not currently use drugs after having used the following drugs: IV, Fentanyl, and Benzodiazepines.  Family History: Family History  Problem Relation Age of Onset   Diabetes Mother    Alcohol abuse Father    Cirrhosis Father    Aneurysm Father     Physical Exam: Vitals:   10/13/22 1450 10/13/22 1454 10/13/22 1929  BP: (!) 136/100  (!) 173/106  Pulse: (!) 104  88  Resp: (!) 22  20  Temp: 97.6 F (36.4 C)  98.4 F (36.9 C)  TempSrc: Oral  Oral  SpO2: 100%  97%  Weight:  72.6 kg   Height:  5\' 3"  (1.6 m)     General: A & O x 3, well developed and nourished, in pain Eyes: Pink conjunctiva, no scleral icterus ENT: Moist oral mucosa, neck supple, no thyromegaly Lungs: CTA B/L, no use of accessory muscles Cardiovascular: RRR, no murmurs.  Abdomen: soft, positive BS, NTND GU: not examined Neuro: CN II - XII grossly intact, sensation intact Musculoskeletal: strength 5/5 all extremities, no reproducible pain over  LS or spinal regions Skin: no rash, no subcutaneous crepitation, no decubitus Psych: appropriate patient, in pain   Labs on Admission:  Recent Labs    10/13/22 1516  NA 135  K 4.0  CL 107  CO2 19*  GLUCOSE 400*  BUN 16  CREATININE 1.16  CALCIUM 9.0   Recent Labs    10/13/22 1516  AST 33  ALT 46*  ALKPHOS 42  BILITOT 0.6  PROT 7.1  ALBUMIN 3.6    Recent Labs    10/13/22 1516  WBC 3.3*  HGB 14.9  HCT 47.0  MCV 98.1  PLT 221     Radiological Exams on Admission: MR Lumbar Spine  W Wo Contrast  Result Date: 10/13/2022 CLINICAL DATA:  Acute myelopathy.  History of discitis EXAM: MRI LUMBAR SPINE WITHOUT AND WITH CONTRAST TECHNIQUE: Multiplanar and multiecho pulse sequences of the lumbar spine were obtained without and with intravenous contrast. CONTRAST:  7.78mL GADAVIST GADOBUTROL 1 MMOL/ML IV SOLN COMPARISON:  Lumbar spine MRI dated 10/08/21. FINDINGS: Segmentation:  Standard. Alignment:  Trace retrolisthesis of L1 on L2. Vertebrae: Compared to prior exam there has been an interval left hemilaminectomy at L5 and drainage of the epidural abscess noted on prior exam. Redemonstrated are findings of discitis osteomyelitis at L5-S1 which appear similar to prior exam. There is persistent small volume contrast-enhancing material within the ventral epidural space at the L5-S1 level, which could represent postsurgical change or residual infection. 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 Compared to prior exam there is interval increase in contrast enhancement along the left lateral aspect of the L2 vertebral body (series 8, image 13), which could represent an additional site of infection. Conus medullaris and cauda equina: Conus extends to the L2 level. On the sagittal T1 weighted post contrast-enhanced sequence there appears to be mild contrast enhancement along the surface of the spinal cord as well as along the cauda equina nerve roots (series 8, image 9-12). These findings raise the possibility for arachnoiditis and/or meningitis. Paraspinal and other soft tissues: Negative. Disc levels: T11-T12: Moderate left and mild right facet degenerative change. Ligamentum flavum hypertrophy. No spinal canal narrowing. No significant neural foraminal narrowing. T12-L1: Mild-to-moderate bilateral facet degenerative change. No significant disc bulge. No spinal  canal narrowing. No neural foraminal narrowing. L1-L2: Central disc protrusion. Mild-to-moderate bilateral facet degenerative change. Mild spinal canal narrowing. Moderate to severe bilateral neural foraminal narrowing. L2-L3: Mild-to-moderate bilateral facet degenerative change. Redemonstrated contrast-enhancing material in the ventral epidural space. Mild overall spinal canal narrowing. Moderate to severe right and moderate left neural foraminal narrowing. L3-L4: Moderate bilateral facet degenerative change. Eccentric left disc bulge. No spinal canal narrowing. Moderate left and mild right neural foraminal narrowing. L4-L5: Moderate  bilateral facet degenerative change. No significant disc bulge. No spinal canal narrowing. Moderate right and mild-to-moderate left neural foraminal narrowing. L5-S1: Left hemilaminotomy. Moderate bilateral facet degenerative change. No spinal canal narrowing. Mild right and severe left neural foraminal narrowing. IMPRESSION: 1. Compared to prior exam there has been an interval left hemilaminectomy at L5 and drainage of the epidural abscess/phlegmon noted on prior exam. Redemonstrated are findings of discitis osteomyelitis at L5-S1 which appear similar to prior exam. There is persistent small volume contrast-enhancing material within the ventral epidural space at the L5-S1 level, which could represent postsurgical change or residual infection. 2. 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 Compared to prior exam there is interval increase in contrast enhancement along the left lateral aspect of the L2 vertebral body, which could represent a site of acute on chronic infection. 3. On the sagittal T1 weighted post contrast-enhanced sequence there appears to be new contrast enhancement along the surface of the spinal cord, as well as along the cauda equina nerve roots. These  findings raise the possibility for arachnoiditis, which may be infectious or post-surgical. 4. Multilevel degenerative changes of the lumbar spine as described above. Electronically Signed   By: Lorenza Cambridge M.D.   On: 10/13/2022 20:17    Assessment/Plan Present on Admission: Acute on chronic osteomyelitis //back pain, severe -IV cefepime and vancomycin initiated in ER, continued -ESR,CRP ordered -Blood cultures x 2 collected -Pain meds as needed -ID consult per a.m. team.  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  Cipro and Bactrim not resumed.  Defer to ID consult -Flexeril as needed  Possible polysubstance/methamphetamine use -UDS ordered -Suboxone 8-2 mg films.  Every 8 hours scheduled -Cymbalta resumed -UDS ordered   MDD (major depressive disorder) //   Bipolar -Seroquel, Cymbalta, Zoloft resumed   Tobacco abuse -Nicotine patch resumed  Nightmares -Prazosin resumed   H/o hep C -Hepatitis panel ordered  Joshua Thomas 10/13/2022, 10:18 PM

## 2022-10-14 ENCOUNTER — Other Ambulatory Visit: Payer: Self-pay | Admitting: Internal Medicine

## 2022-10-14 DIAGNOSIS — M4626 Osteomyelitis of vertebra, lumbar region: Secondary | ICD-10-CM | POA: Diagnosis not present

## 2022-10-14 LAB — COMPREHENSIVE METABOLIC PANEL
ALT: 42 U/L (ref 0–44)
AST: 25 U/L (ref 15–41)
Albumin: 3.1 g/dL — ABNORMAL LOW (ref 3.5–5.0)
Alkaline Phosphatase: 38 U/L (ref 38–126)
Anion gap: 13 (ref 5–15)
BUN: 12 mg/dL (ref 6–20)
CO2: 23 mmol/L (ref 22–32)
Calcium: 9 mg/dL (ref 8.9–10.3)
Chloride: 100 mmol/L (ref 98–111)
Creatinine, Ser: 0.86 mg/dL (ref 0.61–1.24)
GFR, Estimated: >60 mL/min (ref >60)
Glucose, Bld: 171 mg/dL — ABNORMAL HIGH (ref 70–99)
Potassium: 3.8 mmol/L (ref 3.5–5.1)
Sodium: 136 mmol/L (ref 135–145)
Total Bilirubin: 0.6 mg/dL (ref 0.3–1.2)
Total Protein: 6.1 g/dL — ABNORMAL LOW (ref 6.5–8.1)

## 2022-10-14 LAB — CBC WITH DIFFERENTIAL/PLATELET
Abs Immature Granulocytes: 0.01 10*3/uL (ref 0.00–0.07)
Basophils Absolute: 0 10*3/uL (ref 0.0–0.1)
Basophils Relative: 0 %
Eosinophils Absolute: 0.2 10*3/uL (ref 0.0–0.5)
Eosinophils Relative: 5 %
HCT: 40.4 % (ref 39.0–52.0)
Hemoglobin: 13.2 g/dL (ref 13.0–17.0)
Immature Granulocytes: 0 %
Lymphocytes Relative: 39 %
Lymphs Abs: 1.4 10*3/uL (ref 0.7–4.0)
MCH: 30.2 pg (ref 26.0–34.0)
MCHC: 32.7 g/dL (ref 30.0–36.0)
MCV: 92.4 fL (ref 80.0–100.0)
Monocytes Absolute: 0.3 10*3/uL (ref 0.1–1.0)
Monocytes Relative: 8 %
Neutro Abs: 1.7 10*3/uL (ref 1.7–7.7)
Neutrophils Relative %: 48 %
Platelets: 156 10*3/uL (ref 150–400)
RBC: 4.37 MIL/uL (ref 4.22–5.81)
RDW: 15 % (ref 11.5–15.5)
WBC: 3.6 10*3/uL — ABNORMAL LOW (ref 4.0–10.5)
nRBC: 0 % (ref 0.0–0.2)

## 2022-10-14 LAB — HIV ANTIBODY (ROUTINE TESTING W REFLEX): HIV Screen 4th Generation wRfx: NONREACTIVE

## 2022-10-14 LAB — HEPATITIS PANEL, ACUTE
HCV Ab: REACTIVE — AB
Hep A IgM: NONREACTIVE
Hep B C IgM: NONREACTIVE
Hepatitis B Surface Ag: NONREACTIVE

## 2022-10-14 LAB — C-REACTIVE PROTEIN: CRP: 0.9 mg/dL (ref <1.0)

## 2022-10-14 LAB — GLUCOSE, CAPILLARY
Glucose-Capillary: 175 mg/dL — ABNORMAL HIGH (ref 70–99)
Glucose-Capillary: 164 mg/dL — ABNORMAL HIGH (ref 70–99)
Glucose-Capillary: 141 mg/dL — ABNORMAL HIGH (ref 70–99)
Glucose-Capillary: 156 mg/dL — ABNORMAL HIGH (ref 70–99)

## 2022-10-14 LAB — SEDIMENTATION RATE: Sed Rate: 5 mm/h (ref 0–16)

## 2022-10-14 MED ORDER — VANCOMYCIN HCL 1500 MG/300ML IV SOLN: 1500.0000 mg | INTRAVENOUS | Status: DC

## 2022-10-14 MED ORDER — OXYCODONE HCL 5 MG PO TABS
10.0000 mg | ORAL_TABLET | ORAL | Status: DC | PRN
  Administered 2022-10-14 – 2022-10-16 (×7): 10 mg via ORAL
  Filled 2022-10-14 (×8): qty 2

## 2022-10-14 MED ORDER — VANCOMYCIN HCL IN DEXTROSE 1-5 GM/200ML-% IV SOLN
1000.0000 mg | Freq: Two times a day (BID) | INTRAVENOUS | Status: DC
  Administered 2022-10-14: 1000 mg via INTRAVENOUS
  Filled 2022-10-14: qty 200

## 2022-10-14 MED ORDER — METHOCARBAMOL 1000 MG/10ML IJ SOLN
500.0000 mg | Freq: Three times a day (TID) | INTRAVENOUS | Status: DC | PRN
  Administered 2022-10-14: 500 mg via INTRAVENOUS
  Filled 2022-10-14: qty 5
  Filled 2022-10-14: qty 500
  Filled 2022-10-14: qty 5

## 2022-10-14 MED ORDER — BUPRENORPHINE HCL-NALOXONE HCL 2-0.5 MG SL SUBL
1.0000 | SUBLINGUAL_TABLET | Freq: Three times a day (TID) | SUBLINGUAL | Status: DC
  Administered 2022-10-14 – 2022-10-16 (×8): 1 via SUBLINGUAL
  Filled 2022-10-14 (×8): qty 1

## 2022-10-14 MED ORDER — SODIUM CHLORIDE 0.9 % IV SOLN
2.0000 g | Freq: Three times a day (TID) | INTRAVENOUS | Status: DC
  Administered 2022-10-14 (×2): 2 g via INTRAVENOUS
  Filled 2022-10-14 (×2): qty 12.5

## 2022-10-14 NOTE — Progress Notes (Signed)
Pharmacy Antibiotic Note  Joshua Thomas is a 47 y.o. male admitted on 10/13/2022 with worsening back pain. Hx of recurrent L5/S1 discitis/osteomyelitis s/p multiple hospital admissions to [facility]. MRI at [facility] shows chronic discitis/osteomyelitis L5-S1 and chronic discitis L2-3. Pharmacy has been consulted for cefepime and vancomycin dosing.   Scr improved from 1.16 >> 0.86.   Plan: Adjust vancomycin to 1000mg  IV q12 hours (eAUC 507, Scr 0.86, Cmin ~14) Continue cefepime 2g IV q8 hours F/u renal function, micro data and length of therapy Vancomycin levels as needed   Height: 5\' 3"  (160 cm) Weight: 72.6 kg (160 lb 0.9 oz) IBW/kg (Calculated) : 56.9  Temp (24hrs), Avg:98.1 F (36.7 C), Min:97.6 F (36.4 C), Max:98.4 F (36.9 C)  Recent Labs  Lab 10/13/22 1516 10/13/22 1524 10/13/22 1659 10/14/22 0523  WBC 3.3*  --   --  3.6*  CREATININE 1.16  --   --  0.86  LATICACIDVEN  --  4.0* 1.5  --     Estimated Creatinine Clearance: 94.9 mL/min (by C-G formula based on SCr of 0.86 mg/dL).    Allergies  Allergen Reactions   Tylenol [Acetaminophen] Other (See Comments)    Due to Ulcers/ Liver Damage   Morphine Itching    Antimicrobials this admission: Cefepime 10/2> Vancomycin 10/2 >  Microbiology results: 10/2 BCx: pending  Past microbiology data [facility]): 10/18/21 Acid fast culture (s/p matrix discectomy): Mycobacterium porcinum  Thank you for allowing pharmacy to be a part of this patient's care.  Rexford Maus, PharmD, BCPS 10/14/2022 8:20 AM

## 2022-10-14 NOTE — Inpatient Diabetes Management (Signed)
Inpatient Diabetes Program Recommendations  AACE/ADA: New Consensus Statement on Inpatient Glycemic Control (2015)  Target Ranges:  Prepandial:   less than 140 mg/dL      Peak postprandial:   less than 180 mg/dL (1-2 hours)      Critically ill patients:  140 - 180 mg/dL   Lab Results  Component Value Date   GLUCAP 175 (H) 10/14/2022   HGBA1C 7.3 (H) 09/04/2022    Review of Glycemic Control  Latest Reference Range & Units 10/13/22 21:36 10/13/22 23:51 10/14/22 08:03  Glucose-Capillary 70 - 99 mg/dL 161 (H) 096 (H) 045 (H)   Diabetes history: None noted Outpatient Diabetes medications: None Current orders for Inpatient glycemic control:  Novolog 0-15 units tid with meals and HS  Inpatient Diabetes Program Recommendations:    Note A1C of 7.3%.  Is this a new diagnosis of DM? May need oral DM medications added at discharge.   Thanks,  Beryl Meager, RN, BC-ADM Inpatient Diabetes Coordinator Pager [phone]  (8a-5p)

## 2022-10-14 NOTE — Consult Note (Signed)
[facility]    Date of Admission:  10/13/2022      Total days of antibiotics 1  Vancomycin   Cefepime          Reason for Consult: Vertebral Osteomyelitis    Referring Provider: Renford Dills  Primary Care Provider: Benita Stabile, MD   Assessment: Joshua Thomas is a 47 y.o. male here for acute on chronic osteomyelitis / discitis at L2-3 and L5-S1.   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 Plan was to treat for 60m+ on presumption the M Porcinum was pathogenic; no susceptibilities done given broth culture only and planned Bactrim + Cipro at [facility] ID Team.  -Would stop abx and consult IR to see if they can get 2 samples for [place] - one to send in sterile container to [facility] for PCR testing and second for cultures (routine + afb + fungal, if possible).  -Unclear if this is new or relapsing infection - he has SUD but proclaims no injection.  -High risk for AMA discharge    Substance Use Disorder, Polysubstance -  -previously on suboxone but not actively  -h/o OD last 54m -would benefit from Narcan and fentanyl test strips at discharge for harm reduction  -claims no shooting only intranasal/smoking   H/O Hepatitis C Exposure -  -he was previously told he cleared but the last VL I see was 200,000 copies 39m ago.  -repeat HCV RNA, HBsAg, HBsAb.  -HIV nonreactive    Plan: IR consult placed for consideration of aspirate - two samples would be helpful so we can send one for PCR testing to [facility] and other for cultures Stop ABX until #1 HCV RNA, HBsAG, HBsAb to be drawn CRP / ESR in AM  Follow blood cultures for growth     Principal  Problem:   Osteomyelitis of lumbar spine (HCC) Active Problems:   MDD (major depressive disorder), recurrent severe, without psychosis (HCC)   Acute on chronic osteomyelitis (HCC)   Polysubstance abuse (HCC)    buprenorphine-naloxone  1 tablet Sublingual Q8H   divalproex  250 mg Oral QHS   DULoxetine  60 mg Oral Daily   enoxaparin (LOVENOX) injection  40 mg Subcutaneous Q24H   insulin aspart  0-15 Units Subcutaneous TID WC   insulin aspart  0-5 Units Subcutaneous QHS   melatonin  10 mg Oral QHS   nicotine  21 mg Transdermal Daily   prazosin  1 mg Oral QHS   QUEtiapine  100 mg Oral QHS    HPI: Joshua Thomas is a 47 y.o. male with with previous medical history of of Bipolar 1, depresion, PTSD, COPD, Hepatitis C (last VL 200K in 2023), hypertension, diabetes, and closed left fracture s/p IM nail.  He was hospitalized with acute L5-S1 discitis osteomyelitis 07/03/2021 at [facility] - IR aspirate with poor sample and gram stain negative. Treated with Vanco + ceftriaxone and transitioned to linezolid + cirpfloxacin for 6 weeks.    He says he went to drug rehab ([facility]) and has not shot up in several months - Has had some slip ups with smoking - OD in February 2024 with fentanyl requiring narcan / hospital  care. Last seen by [facility] in June 2023 and did not follow up with [place] afterwards.  Presented back to [facility] 10/08/2021 for the same problem and left AMA - went to [facility] 10/16/2021 for persistent discomfort where he went to OR on 10/8 for lumbar matrix discectomy L5/S1 and drain placement by NSGY. Recommended 8 weeks of IV vanc / Cefepime in SNF through 12/03 however he signed out of the SNF prematurely after he felt significantly better.  FU with [facility] ID team on 11/16/2021 outpatient - at this visit there was noted growth of Mycobacterium porcinum in liquid broth only from AFB cultures and CoNS from body fluid (both in liquid media only), unable to send for susceptibilities. Plan was to start high  dose Ciprofloxacin + Bactrim 2 DS tabs BID planned 6 month duration minimum.  He did not start this treatment from the best of his knowledge.   Since signing out from his SNF he has been a bit of a nomad staying in various places with contacts around [place] area. Underwent hernia repair in May 2024 and 2 accounts of [facility] admissions this summer.  Started with drenching sweats a few weeks ago now. Not sure any weight loss. Increased back pain that was reminiscent of what he experienced last year and prompted him to come to [facility] for evaluation.   Review of Systems: Review of Systems  Constitutional:  Positive for diaphoresis. Negative for fever and weight loss.  Skin:  Negative for rash.    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   COPD (chronic obstructive pulmonary disease) (HCC)    DDD (degenerative disc disease), lumbar    Degenerative disc disease, lumbar    Depression    Diabetes mellitus without complication (HCC)    Discitis    GERD (gastroesophageal reflux disease)    Hepatitis C    Hepatitis C    History of kidney stones    History of stomach ulcers    Hx of diabetes mellitus    due to infection   Hypertension    Lung nodules    3 on L and 2 on R   PTSD (post-traumatic stress disorder)    Sciatica    Substance abuse (HCC)     Social History   Tobacco Use   Smoking status: Every Day    Current packs/day: 1.00    Average packs/day: 1 pack/day for 33.0 years (33.0 ttl pk-yrs)    Types: Cigarettes   Smokeless tobacco: Former    Quit date: 08/08/2006   Tobacco comments:    1 PPD  Vaping Use   Vaping status: Every Day  Substance Use Topics   Alcohol use: Not Currently    Alcohol/week: 1.0 standard drink of alcohol    Types: 1 Standard drinks or equivalent  per week    Comment: last used July 2023   Drug use: Not Currently    Types: IV, Fentanyl, Benzodiazepines    Comment: last used July 2023    Family History  Problem Relation Age of Onset   Diabetes Mother    Alcohol abuse Father    Cirrhosis Father    Aneurysm Father    Allergies  Allergen Reactions   Tylenol [Acetaminophen] Other (See Comments)  Due to Ulcers/ Liver Damage   Morphine Itching    OBJECTIVE: Blood pressure (!) 138/98, pulse 86, temperature 98.2 F (36.8 C), temperature source Oral, resp. rate 19, height 5\' 3"  (1.6 m), weight 72.6 kg, SpO2 96%.  Physical Exam  Lab Results Lab Results  Component Value Date   WBC 3.6 (L) 10/14/2022   HGB 13.2 10/14/2022   HCT 40.4 10/14/2022   MCV 92.4 10/14/2022   PLT 156 10/14/2022    Lab Results  Component Value Date   CREATININE 0.86 10/14/2022   BUN 12 10/14/2022   NA 136 10/14/2022   K 3.8 10/14/2022   CL 100 10/14/2022   CO2 23 10/14/2022    Lab Results  Component Value Date   ALT 42 10/14/2022   AST 25 10/14/2022   ALKPHOS 38 10/14/2022   BILITOT 0.6 10/14/2022     Microbiology: Recent Results (from the past 240 hour(s))  Blood culture (routine x 2)     Status: None (Preliminary result)   Collection Time: 10/13/22  3:17 PM   Specimen: BLOOD RIGHT ARM  Result Value Ref Range Status   Specimen Description BLOOD RIGHT ARM  Final   Special Requests   Final    BOTTLES DRAWN AEROBIC AND ANAEROBIC Blood Culture adequate volume   Culture   Final    NO GROWTH < 24 HOURS Performed at [facility], [address], [place], [place] [address]    Report Status PENDING  Incomplete  Blood culture (routine x 2)     Status: None (Preliminary result)   Collection Time: 10/13/22  3:18 PM   Specimen: BLOOD LEFT ARM  Result Value Ref Range Status   Specimen Description BLOOD LEFT ARM  Final   Special Requests   Final    BOTTLES DRAWN AEROBIC AND ANAEROBIC Blood Culture adequate volume   Culture   Final     NO GROWTH < 24 HOURS Performed at [facility], [address] [place], [place] [address]    Report Status PENDING  Incomplete    Rexene Alberts, MSN, NP-C [facility] Pager: [phone]  10/14/2022 2:20 PM

## 2022-10-14 NOTE — Progress Notes (Signed)
PROGRESS NOTE  Joshua Thomas  XBJ:[id number] DOB: 04-25-1975 DOA: 10/13/2022 PCP: Benita Stabile, MD   Brief Narrative: Patient is a 47 year old male with history of depression, bipolar disorder, tobacco use, recurrent L5/S1 discitis/osteomyelitis, drug abuse with fentanyl, methamphetamine, had multiple admissions for lumbar osteomyelitis.  He was last admitted in June 2023 at that time he was discharged on linezolid, ciprofloxacin and was planned for 6 weeks but he did not complete therapy because he felt better.  [facility]10[facility]01/[facility], then 10/610/13/2023 with L5-S1 discitis/osteomyelitis with phlegmon and small abscess.  At [facility] (10/18/21), he was taken to the OR for discectomy.  All cultures drawn both hospitalizations were negative.He was treated with IV vancomycin and cefepime. Cultures collected  at [facility]  grew [employer] M.Porcinum bacteria . He was seen by ID in follow up, suspected to be contaminant but given patient's history he prescribed Cipro 750 mg p.o. twice daily and Bactrim DS 2 tablets p.o. twice daily to be taken for 6 months or longer.  Per patient he may have taken these medications to maybe 3 month.  He did not complete a 14-month course.  Presented now with 1 week history of low back pain which is worsening, no fever or chills.  History of ongoing substance abuse.UDS done 8/23 and 8/27 had positive for THC, barbiturates, cocaine, opiates.  MRI L/S spine shows chronic discitis/osteomyelitis L5-S1 which could be postsurgical or residual infection and chronic discitis L2-3.  MRI reviewed by neurosurgery team, no surgical intervention needed or planned.ID consult obtained  Assessment & Plan:  Principal Problem:   Osteomyelitis of lumbar spine (HCC) Active Problems:   MDD (major depressive disorder), recurrent severe, without psychosis (HCC)   Acute on chronic osteomyelitis (HCC)   Polysubstance abuse (HCC)   Acute on chronic osteomyelitis of lumbosacral spine:  Scenario as above.  Presented with worsening back pain for a week.  Has been treated with antibiotics and admitted multiple times in the past for the same.  Concurrent drug abuse.  Follow-up cultures.  ID  consulted. MRI showed  chronic discitis/osteomyelitis L5-S1 which could be postsurgical or residual infection and chronic discitis L2-3. It is unlikely that patient will stay for long period of time for IV antibiotics.  He cannot be sent home with IV antibiotics.  He might sign AMA. Complains of back pain as expected.  Substance abuse: On Suboxone at home.  On Cymbalta.  Major depressive disorder: On Seroquel, Cymbalta, Zoloft  Tobacco use: Continue nicotine patch  History of hepatitis C        DVT prophylaxis:enoxaparin (LOVENOX) injection 40 mg Start: 10/13/22 2230     Code Status: Full Code  Family Communication: None at bedside  Patient status:Inpatient  Patient is from :home  Anticipated discharge HY:QMVH  Estimated DC date:not sure,needs ID clearance    Consultants: ID  Procedures:None  Antimicrobials:  Anti-infectives (From admission, onward)    Start     Dose/Rate Route Frequency Ordered Stop   10/14/22 1700  vancomycin (VANCOREADY) IVPB 1500 mg/300 mL        1,500 mg 150 mL/hr over 120 Minutes Intravenous Every 24 hours 10/14/22 0025     10/14/22 0100  ceFEPIme (MAXIPIME) 2 g in sodium chloride 0.9 % 100 mL IVPB        2 g 200 mL/hr over 30 Minutes Intravenous Every 8 hours 10/14/22 0006     10/13/22 1715  ceFEPIme (MAXIPIME) 2 g in sodium chloride 0.9 % 100 mL IVPB  2 g 200 mL/hr over 30 Minutes Intravenous  Once 10/13/22 1700 10/13/22 1756   10/13/22 1715  vancomycin (VANCOREADY) IVPB 1500 mg/300 mL        1,500 mg 150 mL/hr over 120 Minutes Intravenous  Once 10/13/22 1700 10/13/22 2152       Subjective: Patient seen and examined at bedside today.  Hemodynamically stable.  Complains of pain in the back and requesting for pain medication and  treatment.  Afebrile.  Objective: Vitals:   10/13/22 1929 10/13/22 2315 10/13/22 2328 10/14/22 0623  BP: (!) 173/106 (!) 156/89 (!) 157/106 (!) 126/94  Pulse: 88  88 80  Resp: 20  20 17   Temp: 98.4 F (36.9 C)  98.4 F (36.9 C) 98.2 F (36.8 C)  TempSrc: Oral  Oral Oral  SpO2: 97%  95% 95%  Weight:      Height:        Intake/Output Summary (Last 24 hours) at 10/14/2022 0743 Last data filed at 10/14/2022 0141 Gross per 24 hour  Intake 3030 ml  Output --  Net 3030 ml   Filed Weights   10/13/22 1454  Weight: 72.6 kg    Examination:  General exam: Does not look in distress HEENT: PERRL Respiratory system:  no wheezes or crackles  Cardiovascular system: S1 & S2 heard, RRR.  Gastrointestinal system: Abdomen is nondistended, soft and nontender. Central nervous system: Alert and oriented Extremities: No edema, no clubbing ,no cyanosis, tenderness on the lower back Skin: No rashes, no ulcers,no icterus     Data Reviewed: I have personally reviewed following labs and imaging studies  CBC: Recent Labs  Lab 10/13/22 1516 10/14/22 0523  WBC 3.3* 3.6*  NEUTROABS  --  1.7  HGB 14.9 13.2  HCT 47.0 40.4  MCV 98.1 92.4  PLT 221 156   Basic Metabolic Panel: Recent Labs  Lab 10/13/22 1516 10/14/22 0523  NA 135 136  K 4.0 3.8  CL 107 100  CO2 19* 23  GLUCOSE 400* 171*  BUN 16 12  CREATININE 1.16 0.86  CALCIUM 9.0 9.0     No results found for this or any previous visit (from the past 240 hour(s)).   Radiology Studies: MR Lumbar Spine W Wo Contrast  Result Date: 10/13/2022 CLINICAL DATA:  Acute myelopathy.  History of discitis EXAM: MRI LUMBAR SPINE WITHOUT AND WITH CONTRAST TECHNIQUE: Multiplanar and multiecho pulse sequences of the lumbar spine were obtained without and with intravenous contrast. CONTRAST:  7.88mL GADAVIST GADOBUTROL 1 MMOL/ML IV SOLN COMPARISON:  Lumbar spine MRI dated 10/08/21. FINDINGS: Segmentation:  Standard. Alignment:  Trace retrolisthesis  of L1 on L2. Vertebrae: Compared to prior exam there has been an interval left hemilaminectomy at L5 and drainage of the epidural abscess noted on prior exam. Redemonstrated are findings of discitis osteomyelitis at L5-S1 which appear similar to prior exam. There is persistent small volume contrast-enhancing material within the ventral epidural space at the L5-S1 level, which could represent postsurgical change or residual infection. 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 Compared to prior exam there is interval increase in contrast enhancement along the left lateral aspect of the L2 vertebral body (series 8, image 13), which could represent an additional site of infection. Conus medullaris and cauda equina: Conus extends to the L2 level. On the sagittal T1 weighted post contrast-enhanced sequence there appears to be mild contrast enhancement along  the surface of the spinal cord as well as along the cauda equina nerve roots (series 8, image 9-12). These findings raise the possibility for arachnoiditis and/or meningitis. Paraspinal and other soft tissues: Negative. Disc levels: T11-T12: Moderate left and mild right facet degenerative change. Ligamentum flavum hypertrophy. No spinal canal narrowing. No significant neural foraminal narrowing. T12-L1: Mild-to-moderate bilateral facet degenerative change. No significant disc bulge. No spinal canal narrowing. No neural foraminal narrowing. L1-L2: Central disc protrusion. Mild-to-moderate bilateral facet degenerative change. Mild spinal canal narrowing. Moderate to severe bilateral neural foraminal narrowing. L2-L3: Mild-to-moderate bilateral facet degenerative change. Redemonstrated contrast-enhancing material in the ventral epidural space. Mild overall spinal canal narrowing. Moderate to severe right and moderate left neural  foraminal narrowing. L3-L4: Moderate bilateral facet degenerative change. Eccentric left disc bulge. No spinal canal narrowing. Moderate left and mild right neural foraminal narrowing. L4-L5: Moderate bilateral facet degenerative change. No significant disc bulge. No spinal canal narrowing. Moderate right and mild-to-moderate left neural foraminal narrowing. L5-S1: Left hemilaminotomy. Moderate bilateral facet degenerative change. No spinal canal narrowing. Mild right and severe left neural foraminal narrowing. IMPRESSION: 1. Compared to prior exam there has been an interval left hemilaminectomy at L5 and drainage of the epidural abscess/phlegmon noted on prior exam. Redemonstrated are findings of discitis osteomyelitis at L5-S1 which appear similar to prior exam. There is persistent small volume contrast-enhancing material within the ventral epidural space at the L5-S1 level, which could represent postsurgical change or residual infection. 2. 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 Compared to prior exam there is interval increase in contrast enhancement along the left lateral aspect of the L2 vertebral body, which could represent a site of acute on chronic infection. 3. On the sagittal T1 weighted post contrast-enhanced sequence there appears to be new contrast enhancement along the surface of the spinal cord, as well as along the cauda equina nerve roots. These findings raise the possibility for arachnoiditis, which may be infectious or post-surgical. 4. Multilevel degenerative changes of the lumbar spine as described above. Electronically Signed   By: Lorenza Cambridge M.D.   On: 10/13/2022 20:17    Scheduled Meds:  buprenorphine-naloxone  1 tablet Sublingual Q8H   divalproex  250 mg Oral QHS   DULoxetine  60 mg Oral Daily   enoxaparin (LOVENOX) injection  40 mg Subcutaneous Q24H   insulin  aspart  0-15 Units Subcutaneous TID WC   insulin aspart  0-5 Units Subcutaneous QHS   melatonin  10 mg Oral QHS   nicotine  21 mg Transdermal Daily   prazosin  1 mg Oral QHS   QUEtiapine  100 mg Oral QHS   Continuous Infusions:  ceFEPime (MAXIPIME) IV 2 g (10/14/22 0141)   lactated ringers 150 mL/hr at 10/13/22 2008   vancomycin       LOS: 1 day   Burnadette Pop, MD [facility] P10/03/2022, 7:43 AM

## 2022-10-14 NOTE — Plan of Care (Signed)
  Problem: Metabolic: Goal: Ability to maintain appropriate glucose levels will improve Outcome: Progressing   Problem: Nutritional: Goal: Maintenance of adequate nutrition will improve Outcome: Progressing   Problem: Skin Integrity: Goal: Risk for impaired skin integrity will decrease Outcome: Progressing   Problem: Clinical Measurements: Goal: Will remain free from infection Outcome: Progressing   Problem: Nutrition: Goal: Adequate nutrition will be maintained Outcome: Progressing   Problem: Coping: Goal: Level of anxiety will decrease Outcome: Progressing   Problem: Pain Managment: Goal: General experience of comfort will improve Outcome: Progressing   Problem: Safety: Goal: Ability to remain free from injury will improve Outcome: Progressing   Problem: Skin Integrity: Goal: Risk for impaired skin integrity will decrease Outcome: Progressing

## 2022-10-15 ENCOUNTER — Other Ambulatory Visit (HOSPITAL_COMMUNITY): Payer: Self-pay

## 2022-10-15 DIAGNOSIS — M4626 Osteomyelitis of vertebra, lumbar region: Secondary | ICD-10-CM | POA: Diagnosis not present

## 2022-10-15 LAB — PROTIME-INR
INR: 1 (ref 0.8–1.2)
Prothrombin Time: 13.4 s (ref 11.4–15.2)

## 2022-10-15 LAB — GLUCOSE, CAPILLARY
Glucose-Capillary: 158 mg/dL — ABNORMAL HIGH (ref 70–99)
Glucose-Capillary: 103 mg/dL — ABNORMAL HIGH (ref 70–99)
Glucose-Capillary: 178 mg/dL — ABNORMAL HIGH (ref 70–99)
Glucose-Capillary: 131 mg/dL — ABNORMAL HIGH (ref 70–99)

## 2022-10-15 LAB — SEDIMENTATION RATE: Sed Rate: 3 mm/h (ref 0–16)

## 2022-10-15 LAB — HEPATITIS B SURFACE ANTIBODY,QUALITATIVE: Hep B S Ab: REACTIVE — AB

## 2022-10-15 LAB — HEPATITIS B SURFACE ANTIGEN: Hepatitis B Surface Ag: NONREACTIVE

## 2022-10-15 LAB — C-REACTIVE PROTEIN: CRP: 0.8 mg/dL (ref <1.0)

## 2022-10-15 MED ORDER — SULFAMETHOXAZOLE-TRIMETHOPRIM 800-160 MG PO TABS
2.0000 | ORAL_TABLET | Freq: Two times a day (BID) | ORAL | Status: DC
  Administered 2022-10-15 – 2022-10-16 (×2): 2 via ORAL
  Filled 2022-10-15 (×2): qty 2

## 2022-10-15 MED ORDER — SULFAMETHOXAZOLE-TRIMETHOPRIM 800-160 MG PO TABS
2.0000 | ORAL_TABLET | Freq: Two times a day (BID) | ORAL | 0 refills | Status: DC
  Filled 2022-10-15: qty 120, 30d supply, fill #0

## 2022-10-15 MED ORDER — CIPROFLOXACIN HCL 750 MG PO TABS
750.0000 mg | ORAL_TABLET | Freq: Two times a day (BID) | ORAL | 0 refills | Status: DC
  Filled 2022-10-15: qty 60, 30d supply, fill #0

## 2022-10-15 MED ORDER — CIPROFLOXACIN HCL 500 MG PO TABS
750.0000 mg | ORAL_TABLET | Freq: Two times a day (BID) | ORAL | Status: DC
  Administered 2022-10-15 – 2022-10-16 (×2): 750 mg via ORAL
  Filled 2022-10-15 (×2): qty 2

## 2022-10-15 MED ORDER — ORITAVANCIN DIPHOSPHATE 400 MG IV SOLR
1200.0000 mg | Freq: Once | INTRAVENOUS | Status: AC
  Administered 2022-10-16: 1200 mg via INTRAVENOUS
  Filled 2022-10-15: qty 120

## 2022-10-15 NOTE — Progress Notes (Addendum)
[facility]    Date of Admission:  10/13/2022   Total days of antibiotics   ID: Joshua Thomas is a 47 y.o. male with  acute on chronic osteomyelitis Principal Problem:   Osteomyelitis of lumbar spine (HCC) Active Problems:   MDD (major depressive disorder), recurrent severe, without psychosis (HCC)   Acute on chronic osteomyelitis (HCC)   Polysubstance abuse (HCC)    Subjective: Slept better last night, better pain control; no  fever or chills  Medications:   buprenorphine-naloxone  1 tablet Sublingual Q8H   divalproex  250 mg Oral QHS   DULoxetine  60 mg Oral Daily   enoxaparin (LOVENOX) injection  40 mg Subcutaneous Q24H   insulin aspart  0-15 Units Subcutaneous TID WC   insulin aspart  0-5 Units Subcutaneous QHS   melatonin  10 mg Oral QHS   nicotine  21 mg Transdermal Daily   prazosin  1 mg Oral QHS   QUEtiapine  100 mg Oral QHS    Objective: Vital signs in last 24 hours: Temp:  [97.7 F (36.5 C)-98.3 F (36.8 C)] 97.7 F (36.5 C) (10/04 0727) Pulse Rate:  [68-86] 76 (10/04 0727) Resp:  [16-19] 18 (10/04 0727) BP: (95-138)/(67-98) 95/81 (10/04 0727) SpO2:  [96 %-98 %] 97 % (10/04 0727)  Physical Exam  Constitutional: He is oriented to person, place, and time. He appears well-developed and well-nourished. No distress.  HENT:  Mouth/Throat: Oropharynx is clear and moist. No oropharyngeal exudate.  Cardiovascular: Normal rate, regular rhythm and normal heart sounds. Exam reveals no gallop and no friction rub.  No murmur heard.  Pulmonary/Chest: Effort normal and breath sounds normal. No respiratory distress. He has no wheezes.  Abdominal: Soft. Bowel sounds are normal. He exhibits no distension. There is no tenderness.  Lymphadenopathy:  He has no cervical adenopathy.  Neurological: He is alert and oriented to person, place, and time.  Skin: Skin is warm and dry. No rash noted. No erythema.  Psychiatric: He has a normal mood and  affect. His behavior is normal.    Lab Results Recent Labs    10/13/22 1516 10/14/22 0523  WBC 3.3* 3.6*  HGB 14.9 13.2  HCT 47.0 40.4  NA 135 136  K 4.0 3.8  CL 107 100  CO2 19* 23  BUN 16 12  CREATININE 1.16 0.86   Liver Panel Recent Labs    10/13/22 1516 10/14/22 0523  PROT 7.1 6.1*  ALBUMIN 3.6 3.1*  AST 33 25  ALT 46* 42  ALKPHOS 42 38  BILITOT 0.6 0.6   Sedimentation Rate Recent Labs    10/15/22 0622  ESRSEDRATE 3   C-Reactive Protein Recent Labs    10/14/22 0523 10/15/22 0622  CRP 0.9 0.8    Microbiology: reviewed Studies/Results: MR Lumbar Spine W Wo Contrast  Result Date: 10/13/2022 CLINICAL DATA:  Acute myelopathy.  History of discitis EXAM: MRI LUMBAR SPINE WITHOUT AND WITH CONTRAST TECHNIQUE: Multiplanar and multiecho pulse sequences of the lumbar spine were obtained without and with intravenous contrast. CONTRAST:  7.41mL GADAVIST GADOBUTROL 1 MMOL/ML IV SOLN COMPARISON:  Lumbar spine MRI dated 10/08/21. FINDINGS: Segmentation:  Standard. Alignment:  Trace retrolisthesis of L1 on L2. Vertebrae: Compared to prior exam there has been an interval left hemilaminectomy at L5 and drainage of the epidural abscess noted on prior exam. Redemonstrated are findings of discitis osteomyelitis at L5-S1 which appear similar to prior exam. There is persistent small volume contrast-enhancing material within the  ventral epidural space at the L5-S1 level, which could represent postsurgical change or residual infection. 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 Compared to prior exam there is interval increase in contrast enhancement along the left lateral aspect of the L2 vertebral body (series 8, image 13), which could represent an additional site of infection. Conus medullaris and cauda equina: Conus extends to the L2 level. On the  sagittal T1 weighted post contrast-enhanced sequence there appears to be mild contrast enhancement along the surface of the spinal cord as well as along the cauda equina nerve roots (series 8, image 9-12). These findings raise the possibility for arachnoiditis and/or meningitis. Paraspinal and other soft tissues: Negative. Disc levels: T11-T12: Moderate left and mild right facet degenerative change. Ligamentum flavum hypertrophy. No spinal canal narrowing. No significant neural foraminal narrowing. T12-L1: Mild-to-moderate bilateral facet degenerative change. No significant disc bulge. No spinal canal narrowing. No neural foraminal narrowing. L1-L2: Central disc protrusion. Mild-to-moderate bilateral facet degenerative change. Mild spinal canal narrowing. Moderate to severe bilateral neural foraminal narrowing. L2-L3: Mild-to-moderate bilateral facet degenerative change. Redemonstrated contrast-enhancing material in the ventral epidural space. Mild overall spinal canal narrowing. Moderate to severe right and moderate left neural foraminal narrowing. L3-L4: Moderate bilateral facet degenerative change. Eccentric left disc bulge. No spinal canal narrowing. Moderate left and mild right neural foraminal narrowing. L4-L5: Moderate bilateral facet degenerative change. No significant disc bulge. No spinal canal narrowing. Moderate right and mild-to-moderate left neural foraminal narrowing. L5-S1: Left hemilaminotomy. Moderate bilateral facet degenerative change. No spinal canal narrowing. Mild right and severe left neural foraminal narrowing. IMPRESSION: 1. Compared to prior exam there has been an interval left hemilaminectomy at L5 and drainage of the epidural abscess/phlegmon noted on prior exam. Redemonstrated are findings of discitis osteomyelitis at L5-S1 which appear similar to prior exam. There is persistent small volume contrast-enhancing material within the ventral epidural space at the L5-S1 level, which could  represent postsurgical change or residual infection. 2. 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 Compared to prior exam there is interval increase in contrast enhancement along the left lateral aspect of the L2 vertebral body, which could represent a site of acute on chronic infection. 3. On the sagittal T1 weighted post contrast-enhanced sequence there appears to be new contrast enhancement along the surface of the spinal cord, as well as along the cauda equina nerve roots. These findings raise the possibility for arachnoiditis, which may be infectious or post-surgical. 4. Multilevel degenerative changes of the lumbar spine as described above. Electronically Signed   By: Lorenza Cambridge M.D.   On: 10/13/2022 20:17     Assessment/Plan: Hx of untreated NTM vertebral osteomyelitis, hx of epidural abscess s/p L5-S1 hemilaminectomy. Now with worsening back apin. Involving L2-L3 as well. -attempt to see if IR can aspirate area to see if ongoing ntm plus aerobic pathogens - spoke with IR who is unable to sample due to approach and location - plan to give 1 time dose of oritavancin and then treat for m.porcinum with bactrim 2ds bid plus cipro 500mg  po bid - will arrange for follow up and blood work  Back pain = continue with current regimen  I have personally spent 50 minutes involved in face-to-face and non-face-to-face activities for this patient on the day of the visit. Professional time  spent includes the following activities: Preparing to see the patient (review of tests), Obtaining and/or reviewing separately obtained history (admission/discharge record), Performing a medically appropriate examination and/or evaluation , Ordering medications/tests/procedures, referring and communicating with other health care professionals, Documenting clinical information in the EMR, Independently  interpreting results (not separately reported), Communicating results to the patient/family/caregiver, Counseling and educating the patient/family/caregiver and Care coordination (not separately reported).     [facility] Pager: [phone]  10/15/2022, 10:53 AM

## 2022-10-15 NOTE — Plan of Care (Signed)
Alert and oriented. Educated patient on plan of care and need to be compliant with antibiotics after discharge.  ID provider came to bedside to discuss discharge planning.  Medicated for pain with PRNs, see MAR for details.   Problem: Education: Goal: Ability to describe self-care measures that may prevent or decrease complications (Diabetes Survival Skills Education) will improve Outcome: Progressing Goal: Individualized Educational Video(s) Outcome: Progressing   Problem: Coping: Goal: Ability to adjust to condition or change in health will improve Outcome: Progressing   Problem: Fluid Volume: Goal: Ability to maintain a balanced intake and output will improve Outcome: Progressing   Problem: Health Behavior/Discharge Planning: Goal: Ability to identify and utilize available resources and services will improve Outcome: Progressing Goal: Ability to manage health-related needs will improve Outcome: Progressing   Problem: Metabolic: Goal: Ability to maintain appropriate glucose levels will improve Outcome: Progressing   Problem: Nutritional: Goal: Maintenance of adequate nutrition will improve Outcome: Progressing Goal: Progress toward achieving an optimal weight will improve Outcome: Progressing   Problem: Skin Integrity: Goal: Risk for impaired skin integrity will decrease Outcome: Progressing   Problem: Tissue Perfusion: Goal: Adequacy of tissue perfusion will improve Outcome: Progressing   Problem: Education: Goal: Knowledge of General Education information will improve Description: Including pain rating scale, medication(s)/side effects and non-pharmacologic comfort measures Outcome: Progressing   Problem: Health Behavior/Discharge Planning: Goal: Ability to manage health-related needs will improve Outcome: Progressing   Problem: Clinical Measurements: Goal: Ability to maintain clinical measurements within normal limits will improve Outcome: Progressing Goal:  Will remain free from infection Outcome: Progressing Goal: Diagnostic test results will improve Outcome: Progressing Goal: Respiratory complications will improve Outcome: Progressing Goal: Cardiovascular complication will be avoided Outcome: Progressing   Problem: Activity: Goal: Risk for activity intolerance will decrease Outcome: Progressing   Problem: Nutrition: Goal: Adequate nutrition will be maintained Outcome: Progressing   Problem: Coping: Goal: Level of anxiety will decrease Outcome: Progressing   Problem: Elimination: Goal: Will not experience complications related to bowel motility Outcome: Progressing Goal: Will not experience complications related to urinary retention Outcome: Progressing   Problem: Pain Managment: Goal: General experience of comfort will improve Outcome: Progressing   Problem: Safety: Goal: Ability to remain free from injury will improve Outcome: Progressing   Problem: Skin Integrity: Goal: Risk for impaired skin integrity will decrease Outcome: Progressing

## 2022-10-15 NOTE — Progress Notes (Signed)
PROGRESS NOTE  Joshua Thomas  VQQ:[id number] DOB: June 25, 1975 DOA: 10/13/2022 PCP: Benita Stabile, MD   Brief Narrative: Patient is a 47 year old male with history of depression, bipolar disorder, tobacco use, recurrent L5/S1 discitis/osteomyelitis, drug abuse with fentanyl, methamphetamine, had multiple admissions for lumbar osteomyelitis.  He was last admitted in June 2023 at that time he was discharged on linezolid, ciprofloxacin and was planned for 6 weeks but he did not complete therapy because he felt better.  [facility]28-10[facility]01/2021, then 10/610/13/2023 with L5-S1 discitis/osteomyelitis with phlegmon and small abscess.  At [facility] (10/18/21), he was taken to the OR for discectomy.  All cultures drawn both hospitalizations were negative.He was treated with IV vancomycin and cefepime. Cultures collected  at [facility]  grew [employer] M.Porcinum bacteria . He was seen by ID in follow up, suspected to be contaminant but given patient's history he prescribed Cipro 750 mg p.o. twice daily and Bactrim DS 2 tablets p.o. twice daily to be taken for 6 months or longer.  Per patient he may have taken these medications to maybe 3 month.  He did not complete a 38-month course.  Presented now with 1 week history of low back pain which is worsening, no fever or chills.  History of ongoing substance abuse.UDS done 8/23 and 8/27 had positive for THC, barbiturates, cocaine, opiates.  MRI L/S spine shows chronic discitis/osteomyelitis L5-S1 which could be postsurgical or residual infection and chronic discitis L2-3.  MRI reviewed by neurosurgery team, no surgical intervention needed or planned.ID consult obtained.  We requested IR for aspiration but IR said that the area of enhancement is not accessible and there is improvement on imaging on  comparison so no plan for aspiration  Assessment & Plan:  Principal Problem:   Osteomyelitis of lumbar spine (HCC) Active Problems:   MDD (major depressive disorder),  recurrent severe, without psychosis (HCC)   Acute on chronic osteomyelitis (HCC)   Polysubstance abuse (HCC)   Acute on chronic osteomyelitis of lumbosacral spine: Scenario as above.  Presented with worsening back pain for a week.  Has been treated with antibiotics and admitted multiple times in the past for the same.  Concurrent drug abuse.  Follow-up cultures.  ID  consulted. MRI showed  chronic discitis/osteomyelitis L5-S1 which could be postsurgical or residual infection and chronic discitis L2-3. ID consult obtained.  We requested IR for aspiration but IR said that the area of enhancement is not accessible and there is improvement on imaging on  comparison so no plan for aspiration  Substance abuse: On Suboxone at home.  On Cymbalta.  Major depressive disorder: On Seroquel, Cymbalta, Zoloft  Tobacco use: Continue nicotine patch  History of hepatitis C        DVT prophylaxis:enoxaparin (LOVENOX) injection 40 mg Start: 10/13/22 2230     Code Status: Full Code  Family Communication: None at bedside  Patient status:Inpatient  Patient is from :home  Anticipated discharge EP:PIRJ  Estimated DC date:not sure,needs ID clearance    Consultants: ID  Procedures:None  Antimicrobials:  Anti-infectives (From admission, onward)    Start     Dose/Rate Route Frequency Ordered Stop   10/14/22 1700  vancomycin (VANCOREADY) IVPB 1500 mg/300 mL  Status:  Discontinued        1,500 mg 150 mL/hr over 120 Minutes Intravenous Every 24 hours 10/14/22 0025 10/14/22 0820   10/14/22 1000  vancomycin (VANCOCIN) IVPB 1000 mg/200 mL premix  Status:  Discontinued  1,000 mg 200 mL/hr over 60 Minutes Intravenous Every 12 hours 10/14/22 0820 10/14/22 1143   10/14/22 0100  ceFEPIme (MAXIPIME) 2 g in sodium chloride 0.9 % 100 mL IVPB  Status:  Discontinued        2 g 200 mL/hr over 30 Minutes Intravenous Every 8 hours 10/14/22 0006 10/14/22 1143   10/13/22 1715  ceFEPIme (MAXIPIME) 2 g in  sodium chloride 0.9 % 100 mL IVPB        2 g 200 mL/hr over 30 Minutes Intravenous  Once 10/13/22 1700 10/13/22 1756   10/13/22 1715  vancomycin (VANCOREADY) IVPB 1500 mg/300 mL        1,500 mg 150 mL/hr over 120 Minutes Intravenous  Once 10/13/22 1700 10/13/22 2152       Subjective: Patient seen and examined at bedside today.  Hemodynamically stable.  Comfortable today.  Back pain well-controlled.  Objective: Vitals:   10/14/22 1329 10/14/22 2027 10/15/22 0500 10/15/22 0727  BP: (!) 138/98 (!) 137/90 99/67 95/81   Pulse: 86 70 68 76  Resp: 19 18 16 18   Temp: 98.2 F (36.8 C) 98.3 F (36.8 C)  97.7 F (36.5 C)  TempSrc: Oral Oral    SpO2: 96% 98% 98% 97%  Weight:      Height:        Intake/Output Summary (Last 24 hours) at 10/15/2022 1132 Last data filed at 10/15/2022 0600 Gross per 24 hour  Intake 295 ml  Output --  Net 295 ml   Filed Weights   10/13/22 1454  Weight: 72.6 kg    Examination:  General exam: Overall comfortable, not in distress HEENT: PERRL Respiratory system:  no wheezes or crackles  Cardiovascular system: S1 & S2 heard, RRR.  Gastrointestinal system: Abdomen is nondistended, soft and nontender. Central nervous system: Alert and oriented Extremities: No edema, no clubbing ,no cyanosis Skin: No rashes, no ulcers,no icterus     Data Reviewed: I have personally reviewed following labs and imaging studies  CBC: Recent Labs  Lab 10/13/22 1516 10/14/22 0523  WBC 3.3* 3.6*  NEUTROABS  --  1.7  HGB 14.9 13.2  HCT 47.0 40.4  MCV 98.1 92.4  PLT 221 156   Basic Metabolic Panel: Recent Labs  Lab 10/13/22 1516 10/14/22 0523  NA 135 136  K 4.0 3.8  CL 107 100  CO2 19* 23  GLUCOSE 400* 171*  BUN 16 12  CREATININE 1.16 0.86  CALCIUM 9.0 9.0     Recent Results (from the past 240 hour(s))  Blood culture (routine x 2)     Status: None (Preliminary result)   Collection Time: 10/13/22  3:17 PM   Specimen: BLOOD RIGHT ARM  Result Value Ref  Range Status   Specimen Description BLOOD RIGHT ARM  Final   Special Requests   Final    BOTTLES DRAWN AEROBIC AND ANAEROBIC Blood Culture adequate volume   Culture   Final    NO GROWTH 2 DAYS Performed at [facility], [address], [place], [place] [address]    Report Status PENDING  Incomplete  Blood culture (routine x 2)     Status: None (Preliminary result)   Collection Time: 10/13/22  3:18 PM   Specimen: BLOOD LEFT ARM  Result Value Ref Range Status   Specimen Description BLOOD LEFT ARM  Final   Special Requests   Final    BOTTLES DRAWN AEROBIC AND ANAEROBIC Blood Culture adequate volume   Culture   Final  NO GROWTH 2 DAYS Performed at [facility], [address] [place], [place] [address]    Report Status PENDING  Incomplete     Radiology Studies: MR Lumbar Spine W Wo Contrast  Result Date: 10/13/2022 CLINICAL DATA:  Acute myelopathy.  History of discitis EXAM: MRI LUMBAR SPINE WITHOUT AND WITH CONTRAST TECHNIQUE: Multiplanar and multiecho pulse sequences of the lumbar spine were obtained without and with intravenous contrast. CONTRAST:  7.20mL GADAVIST GADOBUTROL 1 MMOL/ML IV SOLN COMPARISON:  Lumbar spine MRI dated 10/08/21. FINDINGS: Segmentation:  Standard. Alignment:  Trace retrolisthesis of L1 on L2. Vertebrae: Compared to prior exam there has been an interval left hemilaminectomy at L5 and drainage of the epidural abscess noted on prior exam. Redemonstrated are findings of discitis osteomyelitis at L5-S1 which appear similar to prior exam. There is persistent small volume contrast-enhancing material within the ventral epidural space at the L5-S1 level, which could represent postsurgical change or residual infection. 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 Compared to prior exam there is interval increase in contrast  enhancement along the left lateral aspect of the L2 vertebral body (series 8, image 13), which could represent an additional site of infection. Conus medullaris and cauda equina: Conus extends to the L2 level. On the sagittal T1 weighted post contrast-enhanced sequence there appears to be mild contrast enhancement along the surface of the spinal cord as well as along the cauda equina nerve roots (series 8, image 9-12). These findings raise the possibility for arachnoiditis and/or meningitis. Paraspinal and other soft tissues: Negative. Disc levels: T11-T12: Moderate left and mild right facet degenerative change. Ligamentum flavum hypertrophy. No spinal canal narrowing. No significant neural foraminal narrowing. T12-L1: Mild-to-moderate bilateral facet degenerative change. No significant disc bulge. No spinal canal narrowing. No neural foraminal narrowing. L1-L2: Central disc protrusion. Mild-to-moderate bilateral facet degenerative change. Mild spinal canal narrowing. Moderate to severe bilateral neural foraminal narrowing. L2-L3: Mild-to-moderate bilateral facet degenerative change. Redemonstrated contrast-enhancing material in the ventral epidural space. Mild overall spinal canal narrowing. Moderate to severe right and moderate left neural foraminal narrowing. L3-L4: Moderate bilateral facet degenerative change. Eccentric left disc bulge. No spinal canal narrowing. Moderate left and mild right neural foraminal narrowing. L4-L5: Moderate bilateral facet degenerative change. No significant disc bulge. No spinal canal narrowing. Moderate right and mild-to-moderate left neural foraminal narrowing. L5-S1: Left hemilaminotomy. Moderate bilateral facet degenerative change. No spinal canal narrowing. Mild right and severe left neural foraminal narrowing. IMPRESSION: 1. Compared to prior exam there has been an interval left hemilaminectomy at L5 and drainage of the epidural abscess/phlegmon noted on prior exam.  Redemonstrated are findings of discitis osteomyelitis at L5-S1 which appear similar to prior exam. There is persistent small volume contrast-enhancing material within the ventral epidural space at the L5-S1 level, which could represent postsurgical change or residual infection. 2. 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 Compared to prior exam there is interval increase in contrast enhancement along the left lateral aspect of the L2 vertebral body, which could represent a site of acute on chronic infection. 3. On the sagittal T1 weighted post contrast-enhanced sequence there appears to be new contrast enhancement along the surface of the spinal cord, as well as along the cauda equina nerve roots. These findings raise the possibility for arachnoiditis, which may  be infectious or post-surgical. 4. Multilevel degenerative changes of the lumbar spine as described above. Electronically Signed   By: Lorenza Cambridge M.D.   On: 10/13/2022 20:17    Scheduled Meds:  buprenorphine-naloxone  1 tablet Sublingual Q8H   divalproex  250 mg Oral QHS   DULoxetine  60 mg Oral Daily   enoxaparin (LOVENOX) injection  40 mg Subcutaneous Q24H   insulin aspart  0-15 Units Subcutaneous TID WC   insulin aspart  0-5 Units Subcutaneous QHS   melatonin  10 mg Oral QHS   nicotine  21 mg Transdermal Daily   prazosin  1 mg Oral QHS   QUEtiapine  100 mg Oral QHS   Continuous Infusions:  methocarbamol (ROBAXIN) IV 110 mL/hr at 10/15/22 0600     LOS: 2 days   Burnadette Pop, MD [facility] P10/04/2022, 11:32 AM

## 2022-10-15 NOTE — Progress Notes (Addendum)
Interventional Neuroradiology Brief Note:  Joshua Thomas is a 47 year male with acute on chronic osteomyelitis.  IR consulted for disc aspiration. Imaging was just reviewed with Dr. Tommie Sams who notes presence of a T2 hyperintensity in the anterior disc which is not accessible. There is an also area of enhancement in the epidural space that is not accessible. Overall imaging is improved. No plans for aspiration in IR today.  Ordering service made aware.  IR consulted canceled, please reconsult if needed.   Loyce Dys, MS RD PA-C

## 2022-10-15 NOTE — Progress Notes (Signed)
After speaking with Nedra Hai twice and his mother over the phone will coordinate antibiotics to be ready for discharge Saturday 10/5 in the evening (he says around 5 pm) after his son gets off work in [place] and can come to get him.   He mentioned he wants to do a drug rehab program to help long term - planning something in [place]; I suggested him to wait until we can establish he is improving consistently and able to tolerate antibiotics. He agreed.   Appt scheduled with Marcos Eke next Friday, 9:00 am 10/11 - I was clear that we are not a specialty clinic to manage pain, especially with more complex history he needs to arrange FU with provider at [facility] off [address]. I wrote down the  phone number and suggested he try to call to arrange this now.   Oritavancin x 1 tomorrow AM. Will have TOC fill bactrim 2 ds tabs BID + cipro 750 mg bid. 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   FU hep c labs and need to treat outpatient.  D/W Dr. Renford Dills, Dr. Drue Second, ID pharmacy and RN    Rexene Alberts, MSN, NP-C [facility]  [email or web address] Pager: [phone] Office: [phone] [facility] Main Line: [phone] *Secure Chat Communication Welcome

## 2022-10-16 ENCOUNTER — Other Ambulatory Visit (HOSPITAL_COMMUNITY): Payer: Self-pay

## 2022-10-16 DIAGNOSIS — M4626 Osteomyelitis of vertebra, lumbar region: Secondary | ICD-10-CM | POA: Diagnosis not present

## 2022-10-16 LAB — HCV RNA QUANT
HCV Quantitative Log: 4.097 {Log} (ref >1.70)
HCV Quantitative: 12500 [IU]/mL (ref >50)

## 2022-10-16 LAB — GLUCOSE, CAPILLARY
Glucose-Capillary: 191 mg/dL — ABNORMAL HIGH (ref 70–99)
Glucose-Capillary: 105 mg/dL — ABNORMAL HIGH (ref 70–99)

## 2022-10-16 MED ORDER — QUETIAPINE FUMARATE 100 MG PO TABS
100.0000 mg | ORAL_TABLET | Freq: Every day | ORAL | 0 refills | Status: DC
  Filled 2022-10-16: qty 30, 30d supply, fill #0

## 2022-10-16 MED ORDER — OXYCODONE HCL 10 MG PO TABS
10.0000 mg | ORAL_TABLET | ORAL | 0 refills | Status: DC | PRN
  Filled 2022-10-16: qty 20, 4d supply, fill #0

## 2022-10-16 NOTE — Plan of Care (Signed)
  Problem: Education: Goal: Ability to describe self-care measures that may prevent or decrease complications (Diabetes Survival Skills Education) will improve 10/16/2022 0215 by Loni Dolly, RN Outcome: Progressing 10/16/2022 0215 by Loni Dolly, RN Outcome: Progressing Goal: Individualized Educational Video(s) 10/16/2022 0215 by Loni Dolly, RN Outcome: Progressing 10/16/2022 0215 by Loni Dolly, RN Outcome: Progressing   Problem: Coping: Goal: Ability to adjust to condition or change in health will improve 10/16/2022 0215 by Loni Dolly, RN Outcome: Progressing 10/16/2022 0215 by Loni Dolly, RN Outcome: Progressing   Problem: Metabolic: Goal: Ability to maintain appropriate glucose levels will improve 10/16/2022 0215 by Loni Dolly, RN Outcome: Progressing 10/16/2022 0215 by Loni Dolly, RN Outcome: Progressing   Problem: Activity: Goal: Risk for activity intolerance will decrease 10/16/2022 0215 by Loni Dolly, RN Outcome: Progressing 10/16/2022 0215 by Loni Dolly, RN Outcome: Progressing   Problem: Skin Integrity: Goal: Risk for impaired skin integrity will decrease Outcome: Progressing

## 2022-10-16 NOTE — Progress Notes (Signed)
DISCHARGE NOTE HOME Joshua [place] to be discharged Home per MD order. Discussed prescriptions and follow up appointments with the patient. Prescriptions given to patient; medication list explained in detail. Patient verbalized understanding.  Skin clean, dry and intact without evidence of skin break down, no evidence of skin tears noted. IV catheter discontinued intact. Site without signs and symptoms of complications. Dressing and pressure applied. Pt denies pain at the site currently. No complaints noted.  Patient free of lines, drains, and wounds.   An After Visit Summary (AVS) was printed and given to the patient. Patient escorted via wheelchair, and discharged home via private auto.  Lorine Bears, RN

## 2022-10-16 NOTE — Discharge Summary (Signed)
Physician Discharge Summary  Joshua Thomas ZOX:[id number] DOB: [birth year] DOA: 10/13/2022  PCP: Benita Stabile, MD  Admit date: 10/13/2022 Discharge date: 10/16/2022  Admitted From: Home Disposition:  Home  Discharge Condition:Stable CODE STATUS:FULL Diet recommendation: regular  Brief/Interim Summary: Patient is a 47 year old male with history of depression, bipolar disorder, tobacco use, recurrent L5/S1 discitis/osteomyelitis, drug abuse with fentanyl, methamphetamine, had multiple admissions for lumbar osteomyelitis.  He was last admitted in June 2023 at that time he was discharged on linezolid, ciprofloxacin and was planned for 6 weeks but he did not complete therapy because he felt better.  [facility], then 10/610/13/2023 with L5-S1 discitis/osteomyelitis with phlegmon and small abscess.  At [facility] (10/18/21), he was taken to the OR for discectomy.  All cultures drawn both hospitalizations were negative.He was treated with IV vancomycin and cefepime. Cultures collected  at [facility]  grew [employer] M.Porcin[employer] bacteria . He was seen by ID in follow up, suspected to be contaminant but given patient's history he prescribed Cipro 750 mg p.o. twice daily and Bactrim DS 2 tablets p.o. twice daily to be taken for 6 months or longer.  Per patient he may have taken these medications to maybe 3 month.  He did not complete a 23-month course.  Presented now with 1 week history of low back pain which is worsening, no fever or chills.  History of ongoing substance abuse.UDS done 8/23 and 8/27 had positive for THC, barbiturates, cocaine, opiates.  MRI L/S spine shows chronic discitis/osteomyelitis L5-S1 which could be postsurgical or residual infection and chronic discitis L2-3.  MRI reviewed by neurosurgery team, no surgical intervention needed or planned.ID consult obtained.  We requested IR for aspiration but IR said that the area of enhancement is not accessible and there is improvement  on imaging on  comparison so no plan for aspiration .  ID recommended oritavancin 1 dose and discharge on Bactrim and ciprofloxacin.he will follow-up with ID as an outpatient.  Medically stable for discharge today  Following problems were addressed during the hospitalization:  Acute on chronic osteomyelitis of lumbosacral spine: Scenario as above.  Presented with worsening back pain for a week.  Has been treated with antibiotics and admitted multiple times in the past for the same.  Concurrent drug abuse.  MRI showed  chronic discitis/osteomyelitis L5-S1 which could be postsurgical or residual infection and chronic discitis L2-3. ID consult obtained.  We requested IR for aspiration but IR said that the area of enhancement is not accessible and there is improvement on imaging on  comparison so no plan for aspiration. ID recommended oritavancin 1 dose and discharge on Bactrim and ciprofloxacin.he will follow-up with ID as an outpatient.     Substance abuse: On Suboxone at home.  On Cymbalta.   Major depressive disorder: On Seroquel, Cymbalta, Zoloft   Tobacco use: Continue nicotine patch   History of hepatitis C   Discharge Diagnoses:  Principal Problem:   Osteomyelitis of lumbar spine (HCC) Active Problems:   MDD (major depressive disorder), recurrent severe, without psychosis (HCC)   Acute on chronic osteomyelitis (HCC)   Polysubstance abuse [facility])    Discharge Instructions  Discharge Instructions     Diet general   Complete by: As directed    Discharge instructions   Complete by: As directed    1)Please take prescribed medications as instructed 2)Quit substance abuse 3)Follow up with your PCP and infectious disease as an outpatient   Increase activity slowly   Complete  by: As directed       Allergies as of 10/16/2022       Reactions   Tylenol [acetaminophen] Other (See Comments)   Due to Ulcers/ Liver Damage   Morphine Itching        Medication List     TAKE these  medications    ciprofloxacin 750 MG tablet Commonly known as: CIPRO Take 1 tablet (750 mg total) by mouth 2 (two) times daily.   cyclobenzaprine 10 MG tablet Commonly known as: FLEXERIL Take 10 mg by mouth 2 (two) times daily as needed for muscle spasms.   divalproex 250 MG DR tablet Commonly known as: DEPAKOTE Take 250 mg by mouth at bedtime.   hydrOXYzine 50 MG capsule Commonly known as: VISTARIL Take 50 mg by mouth 2 (two) times daily as needed for anxiety.   Melatonin 10 MG Tabs Take 10 mg by mouth at bedtime.   naloxone 4 MG/0.1ML Liqd nasal spray kit Commonly known as: NARCAN Take in case of overdose   nicotine 21 mg/24hr patch Commonly known as: NICODERM CQ - dosed in mg/24 hours Place 1 patch (21 mg total) onto the skin daily.   Oxycodone HCl 10 MG Tabs Take 1 tablet (10 mg total) by mouth every 4 (four) hours as needed for moderate pain.   prazosin 1 MG capsule Commonly known as: MINIPRESS Take 1 mg by mouth at bedtime.   QUEtiapine 100 MG tablet Commonly known as: SEROQUEL Take 100 mg by mouth at bedtime.   sertraline 50 MG tablet Commonly known as: ZOLOFT Take 100 mg by mouth daily.   Suboxone 8-2 MG Film Generic drug: Buprenorphine HCl-Naloxone HCl Place 1 Film under the tongue 2 (two) times daily.   sulfamethoxazole-trimethoprim 800-160 MG tablet Commonly known as: BACTRIM DS Take 2 tablets by mouth 2 (two) times daily.   TUMS PO Take 2-3 tablets by mouth daily as needed (gerd).        Follow-up Information     [facility] Follow up on 10/22/2022.   Specialty: Infectious Diseases Why: Hospital Follow up on oral antibiotics, lab check @ 9:00 am Contact information: [address] [place], [address] [place] [address] [phone]        Benita Stabile, MD Follow up.   Specialty: Internal Medicine Contact information: [address] [place] [address] [phone]                 Allergies  Allergen Reactions   Tylenol [Acetaminophen] Other (See Comments)    Due to Ulcers/ Liver Damage   Morphine Itching    Consultations: ID   Procedures/Studies: MR Lumbar Spine W Wo Contrast  Result Date: 10/13/2022 CLINICAL DATA:  Acute myelopathy.  History of discitis EXAM: MRI LUMBAR SPINE WITHOUT AND WITH CONTRAST TECHNIQUE: Multiplanar and multiecho pulse sequences of the lumbar spine were obtained without and with intravenous contrast. CONTRAST:  7.67mL GADAVIST GADOBUTROL 1 MMOL/ML IV SOLN COMPARISON:  Lumbar spine MRI dated 10/08/21. FINDINGS: Segmentation:  Standard. Alignment:  Trace retrolisthesis of L1 on L2. Vertebrae: Compared to prior exam there has been an interval left hemilaminectomy at L5 and drainage of the epidural abscess noted on prior exam. Redemonstrated are findings of discitis osteomyelitis at L5-S1 which appear similar to prior exam. There is persistent small volume contrast-enhancing material within the ventral epidural space at the L5-S1 level, which could represent postsurgical change or residual infection. Redemonstrated are findings chronic discitis osteomyelitis of the  L2-L3 level with persistent mild contrast enhancement in the ventral epidural space at this level (series 8, image 10). Compared to prior exam there is interval increase in contrast enhancement along the left lateral aspect of the L2 vertebral body (series 8, image 13), which could represent an additional site of infection. Conus medullaris and cauda equina: Conus extends to the L2 level. On the sagittal T1 weighted post contrast-enhanced sequence there appears to be mild contrast enhancement along the surface of the spinal cord as well as along the cauda equina nerve roots (series 8, image 9-12). These findings raise the possibility for arachnoiditis and/or meningitis. Paraspinal and other soft tissues: Negative. Disc levels: T11-T12: Moderate left and mild right facet  degenerative change. Ligamentum flavum hypertrophy. No spinal canal narrowing. No significant neural foraminal narrowing. T12-L1: Mild-to-moderate bilateral facet degenerative change. No significant disc bulge. No spinal canal narrowing. No neural foraminal narrowing. L1-L2: Central disc protrusion. Mild-to-moderate bilateral facet degenerative change. Mild spinal canal narrowing. Moderate to severe bilateral neural foraminal narrowing. L2-L3: Mild-to-moderate bilateral facet degenerative change. Redemonstrated contrast-enhancing material in the ventral epidural space. Mild overall spinal canal narrowing. Moderate to severe right and moderate left neural foraminal narrowing. L3-L4: Moderate bilateral facet degenerative change. Eccentric left disc bulge. No spinal canal narrowing. Moderate left and mild right neural foraminal narrowing. L4-L5: Moderate bilateral facet degenerative change. No significant disc bulge. No spinal canal narrowing. Moderate right and mild-to-moderate left neural foraminal narrowing. L5-S1: Left hemilaminotomy. Moderate bilateral facet degenerative change. No spinal canal narrowing. Mild right and severe left neural foraminal narrowing. IMPRESSION: 1. Compared to prior exam there has been an interval left hemilaminectomy at L5 and drainage of the epidural abscess/phlegmon noted on prior exam. Redemonstrated are findings of discitis osteomyelitis at L5-S1 which appear similar to prior exam. There is persistent small volume contrast-enhancing material within the ventral epidural space at the L5-S1 level, which could represent postsurgical change or residual infection. 2. 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 Compared to prior exam there is interval increase in contrast enhancement along the left lateral aspect of the L2 vertebral body, which could represent a site of acute on  chronic infection. 3. On the sagittal T1 weighted post contrast-enhanced sequence there appears to be new contrast enhancement along the surface of the spinal cord, as well as along the cauda equina nerve roots. These findings raise the possibility for arachnoiditis, which may be infectious or post-surgical. 4. Multilevel degenerative changes of the lumbar spine as described above. Electronically Signed   By: Lorenza Cambridge M.D.   On: 10/13/2022 20:17      Subjective: Patient seen and examined at bedside today.  Denies any significant back pain today.  We discussed about discharge planning after IV antibiotic  Discharge Exam: Vitals:   10/16/22 0437 10/16/22 0801  BP: 103/82 125/77  Pulse: 74 (!) 57  Resp: 18 18  Temp: 98 F (36.7 C) 98 F (36.7 C)  SpO2: 97% 96%   Vitals:   10/15/22 2004 10/16/22 0437 10/16/22 0437 10/16/22 0801  BP: (!) 124/98 103/82 103/82 125/77  Pulse: 79 72 74 (!) 57  Resp:  18 18 18   Temp: 98.6 F (37 C) 98 F (36.7 C) 98 F (36.7 C) 98 F (36.7 C)  TempSrc: Oral Oral Oral   SpO2: 96% 97% 97% 96%  Weight:      Height:  General: Pt is alert, awake, not in acute distress Cardiovascular: RRR, S1/S2 +, no rubs, no gallops Respiratory: CTA bilaterally, no wheezing, no rhonchi Abdominal: Soft, NT, ND, bowel sounds + Extremities: no edema, no cyanosis    The results of significant diagnostics from this hospitalization (including imaging, microbiology, ancillary and laboratory) are listed below for reference.     Microbiology: Recent Results (from the past 240 hour(s))  Blood culture (routine x 2)     Status: None (Preliminary result)   Collection Time: 10/13/22  3:17 PM   Specimen: BLOOD RIGHT ARM  Result Value Ref Range Status   Specimen Description BLOOD RIGHT ARM  Final   Special Requests   Final    BOTTLES DRAWN AEROBIC AND ANAEROBIC Blood Culture adequate volume   Culture   Final    NO GROWTH 3 DAYS Performed at [facility]  Lab, [address] [place], [place] [address]    Report Status PENDING  Incomplete  Blood culture (routine x 2)     Status: None (Preliminary result)   Collection Time: 10/13/22  3:18 PM   Specimen: BLOOD LEFT ARM  Result Value Ref Range Status   Specimen Description BLOOD LEFT ARM  Final   Special Requests   Final    BOTTLES DRAWN AEROBIC AND ANAEROBIC Blood Culture adequate volume   Culture   Final    NO GROWTH 3 DAYS Performed at [facility], [address] [place], [place] [address]    Report Status PENDING  Incomplete     Labs: BNP (last 3 results) No results for input(s): "BNP" in the last 8760 hours. Basic Metabolic Panel: Recent Labs  Lab 10/13/22 1516 10/14/22 0523  NA 135 136  K 4.0 3.8  CL 107 100  CO2 19* 23  GLUCOSE 400* 171*  BUN 16 12  CREATININE 1.16 0.86  CALCIUM 9.0 9.0   Liver Function Tests: Recent Labs  Lab 10/13/22 1516 10/14/22 0523  AST 33 25  ALT 46* 42  ALKPHOS 42 38  BILITOT 0.6 0.6  PROT 7.1 6.1*  ALBUMIN 3.6 3.1*   No results for input(s): "LIPASE", "AMYLASE" in the last 168 hours. No results for input(s): "AMMONIA" in the last 168 hours. CBC: Recent Labs  Lab 10/13/22 1516 10/14/22 0523  WBC 3.3* 3.6*  NEUTROABS  --  1.7  HGB 14.9 13.2  HCT 47.0 40.4  MCV 98.1 92.4  PLT 221 156   Cardiac Enzymes: No results for input(s): "CKTOTAL", "CKMB", "CKMBINDEX", "TROPONINI" in the last 168 hours. BNP: Invalid input(s): "POCBNP" CBG: Recent Labs  Lab 10/15/22 0725 10/15/22 1133 10/15/22 1649 10/15/22 2018 10/16/22 0759  GLUCAP 158* 103* 178* 131* 191*   D-Dimer No results for input(s): "DDIMER" in the last 72 hours. Hgb A1c No results for input(s): "HGBA1C" in the last 72 hours. Lipid Profile No results for input(s): "CHOL", "HDL", "LDLCALC", "TRIG", "CHOLHDL", "LDLDIRECT" in the last 72 hours. Thyroid function studies No results for input(s): "TSH", "T4TOTAL", "T3FREE", "THYROIDAB" in the last 72  hours.  Invalid input(s): "FREET3" Anemia work up No results for input(s): "VITAMINB12", "FOLATE", "FERRITIN", "TIBC", "IRON", "RETICCTPCT" in the last 72 hours. Urinalysis    Component Value Date/Time   COLORURINE STRAW (A) 05/29/2022 0256   APPEARANCEUR CLEAR 05/29/2022 0256   APPEARANCEUR Hazy 09/28/2013 1707   LABSPEC 1.029 05/29/2022 0256   LABSPEC 1.017 09/28/2013 1707   PHURINE 6.0 05/29/2022 0256   GLUCOSEU NEGATIVE 05/29/2022 0256   GLUCOSEU Negative 09/28/2013 1707  HGBUR NEGATIVE 05/29/2022 0256   BILIRUBINUR NEGATIVE 05/29/2022 0256   BILIRUBINUR Negative 09/28/2013 1707   KETONESUR NEGATIVE 05/29/2022 0256   PROTEINUR NEGATIVE 05/29/2022 0256   UROBILINOGEN 0.2 03/24/2014 1341   NITRITE NEGATIVE 05/29/2022 0256   LEUKOCYTESUR NEGATIVE 05/29/2022 0256   LEUKOCYTESUR Negative 09/28/2013 1707   Sepsis Labs Recent Labs  Lab 10/13/22 1516 10/14/22 0523  WBC 3.3* 3.6*   Microbiology Recent Results (from the past 240 hour(s))  Blood culture (routine x 2)     Status: None (Preliminary result)   Collection Time: 10/13/22  3:17 PM   Specimen: BLOOD RIGHT ARM  Result Value Ref Range Status   Specimen Description BLOOD RIGHT ARM  Final   Special Requests   Final    BOTTLES DRAWN AEROBIC AND ANAEROBIC Blood Culture adequate volume   Culture   Final    NO GROWTH 3 DAYS Performed at [facility], [address] [place], [place] [address]    Report Status PENDING  Incomplete  Blood culture (routine x 2)     Status: None (Preliminary result)   Collection Time: 10/13/22  3:18 PM   Specimen: BLOOD LEFT ARM  Result Value Ref Range Status   Specimen Description BLOOD LEFT ARM  Final   Special Requests   Final    BOTTLES DRAWN AEROBIC AND ANAEROBIC Blood Culture adequate volume   Culture   Final    NO GROWTH 3 DAYS Performed at [facility], [address] [place], [place] [address]    Report Status PENDING  Incomplete    Please note: You were cared  for by a hospitalist during your hospital stay. Once you are discharged, your primary care physician will handle any further medical issues. Please note that NO REFILLS for any discharge medications will be authorized once you are discharged, as it is imperative that you return to your primary care physician (or establish a relationship with a primary care physician if you do not have one) for your post hospital discharge needs so that they can reassess your need for medications and monitor your lab values.    Time coordinating discharge: 40 minutes  SIGNED:   Burnadette Pop, MD  [facility] 10/16/2022, 10:18 AM Pager [phone]  If 7PM-7AM, please contact night-coverage [email or web address] Password [email or web address]

## 2022-10-18 LAB — CULTURE, BLOOD (ROUTINE X 2)
Culture: NO GROWTH
Culture: NO GROWTH
Special Requests: ADEQUATE
Special Requests: ADEQUATE

## 2022-10-22 ENCOUNTER — Other Ambulatory Visit: Payer: Self-pay

## 2022-10-22 ENCOUNTER — Emergency Department
Admission: EM | Admit: 2022-10-22 | Discharge: 2022-10-22 | Disposition: A | Discharge status: Home / Self Care | Payer: MEDICAID | Attending: Emergency Medicine | Admitting: Emergency Medicine

## 2022-10-22 ENCOUNTER — Telehealth: Payer: Self-pay

## 2022-10-22 ENCOUNTER — Emergency Department: Payer: MEDICAID

## 2022-10-22 ENCOUNTER — Encounter: Payer: Self-pay | Admitting: Emergency Medicine

## 2022-10-22 ENCOUNTER — Ambulatory Visit: Payer: MEDICAID | Admitting: Family

## 2022-10-22 DIAGNOSIS — G8929 Other chronic pain: Secondary | ICD-10-CM | POA: Diagnosis not present

## 2022-10-22 DIAGNOSIS — Z79899 Other long term (current) drug therapy: Secondary | ICD-10-CM | POA: Insufficient documentation

## 2022-10-22 DIAGNOSIS — M549 Dorsalgia, unspecified: Secondary | ICD-10-CM | POA: Diagnosis present

## 2022-10-22 DIAGNOSIS — M4646 Discitis, unspecified, lumbar region: Secondary | ICD-10-CM | POA: Insufficient documentation

## 2022-10-22 LAB — URINE DRUG SCREEN, QUALITATIVE (ARMC ONLY)
Amphetamines, Ur Screen: NOT DETECTED
Barbiturates, Ur Screen: NOT DETECTED
Benzodiazepine, Ur Scrn: NOT DETECTED
Cannabinoid 50 Ng, Ur ~~LOC~~: NOT DETECTED
Cocaine Metabolite,Ur ~~LOC~~: NOT DETECTED
MDMA (Ecstasy)Ur Screen: NOT DETECTED
Methadone Scn, Ur: NOT DETECTED
Opiate, Ur Screen: NOT DETECTED
Phencyclidine (PCP) Ur S: NOT DETECTED
Tricyclic, Ur Screen: POSITIVE — AB

## 2022-10-22 LAB — URINALYSIS, ROUTINE W REFLEX MICROSCOPIC
Bilirubin Urine: NEGATIVE
Glucose, UA: >=500 mg/dL — AB
Hgb urine dipstick: NEGATIVE
Ketones, ur: NEGATIVE mg/dL
Leukocytes,Ua: NEGATIVE
Nitrite: NEGATIVE
Protein, ur: 30 mg/dL — AB
Specific Gravity, Urine: 1.017 (ref 1.005–1.030)
pH: 6 (ref 5.0–8.0)

## 2022-10-22 LAB — COMPREHENSIVE METABOLIC PANEL
ALT: 27 U/L (ref 0–44)
AST: 18 U/L (ref 15–41)
Albumin: 4.6 g/dL (ref 3.5–5.0)
Alkaline Phosphatase: 55 U/L (ref 38–126)
Anion gap: 9 (ref 5–15)
BUN: 15 mg/dL (ref 6–20)
CO2: 26 mmol/L (ref 22–32)
Calcium: 10.2 mg/dL (ref 8.9–10.3)
Chloride: 101 mmol/L (ref 98–111)
Creatinine, Ser: 0.87 mg/dL (ref 0.61–1.24)
GFR, Estimated: >60 mL/min (ref >60)
Glucose, Bld: 255 mg/dL — ABNORMAL HIGH (ref 70–99)
Potassium: 3.9 mmol/L (ref 3.5–5.1)
Sodium: 136 mmol/L (ref 135–145)
Total Bilirubin: 0.6 mg/dL (ref 0.3–1.2)
Total Protein: 8.7 g/dL — ABNORMAL HIGH (ref 6.5–8.1)

## 2022-10-22 LAB — CBC WITH DIFFERENTIAL/PLATELET
Abs Immature Granulocytes: 0.01 10*3/uL (ref 0.00–0.07)
Basophils Absolute: 0 10*3/uL (ref 0.0–0.1)
Basophils Relative: 1 %
Eosinophils Absolute: 0.1 10*3/uL (ref 0.0–0.5)
Eosinophils Relative: 1 %
HCT: 48 % (ref 39.0–52.0)
Hemoglobin: 15.7 g/dL (ref 13.0–17.0)
Immature Granulocytes: 0 %
Lymphocytes Relative: 31 %
Lymphs Abs: 1.4 10*3/uL (ref 0.7–4.0)
MCH: 30.6 pg (ref 26.0–34.0)
MCHC: 32.7 g/dL (ref 30.0–36.0)
MCV: 93.6 fL (ref 80.0–100.0)
Monocytes Absolute: 0.3 10*3/uL (ref 0.1–1.0)
Monocytes Relative: 6 %
Neutro Abs: 2.7 10*3/uL (ref 1.7–7.7)
Neutrophils Relative %: 61 %
Platelets: 157 10*3/uL (ref 150–400)
RBC: 5.13 MIL/uL (ref 4.22–5.81)
RDW: 13.9 % (ref 11.5–15.5)
WBC: 4.4 10*3/uL (ref 4.0–10.5)
nRBC: 0 % (ref 0.0–0.2)

## 2022-10-22 LAB — LACTIC ACID, PLASMA: Lactic Acid, Venous: 1.3 mmol/L (ref 0.5–1.9)

## 2022-10-22 LAB — SEDIMENTATION RATE: Sed Rate: 4 mm/h (ref 0–15)

## 2022-10-22 MED ORDER — OXYCODONE HCL 5 MG PO TABS
10.0000 mg | ORAL_TABLET | Freq: Once | ORAL | Status: AC
  Administered 2022-10-22: 10 mg via ORAL
  Filled 2022-10-22: qty 2

## 2022-10-22 MED ORDER — OXYCODONE HCL 10 MG PO TABS
10.0000 mg | ORAL_TABLET | Freq: Three times a day (TID) | ORAL | 0 refills | Status: DC | PRN
Start: 2022-10-22 — End: 2022-12-07

## 2022-10-22 MED ORDER — ONDANSETRON HCL 4 MG/2ML IJ SOLN
4.0000 mg | Freq: Once | INTRAMUSCULAR | Status: AC
  Administered 2022-10-22: 4 mg via INTRAVENOUS
  Filled 2022-10-22: qty 2

## 2022-10-22 MED ORDER — HYDROMORPHONE HCL 1 MG/ML IJ SOLN
1.0000 mg | Freq: Once | INTRAMUSCULAR | Status: AC
  Administered 2022-10-22: 1 mg via INTRAVENOUS
  Filled 2022-10-22: qty 1

## 2022-10-22 MED ORDER — LACTATED RINGERS IV BOLUS
1000.0000 mL | Freq: Once | INTRAVENOUS | Status: AC
  Administered 2022-10-22: 1000 mL via INTRAVENOUS

## 2022-10-22 MED ORDER — DIAZEPAM 5 MG/ML IJ SOLN
5.0000 mg | Freq: Once | INTRAMUSCULAR | Status: AC
  Administered 2022-10-22: 5 mg via INTRAVENOUS
  Filled 2022-10-22: qty 2

## 2022-10-22 MED ORDER — GADOBUTROL 1 MMOL/ML IV SOLN
7.0000 mL | Freq: Once | INTRAVENOUS | Status: AC | PRN
  Administered 2022-10-22: 7 mL via INTRAVENOUS

## 2022-10-22 NOTE — ED Provider Notes (Signed)
[place] EMERGENCY DEPARTMENT AT [facility] REGIONAL Provider Note   CSN: [id number] Arrival date & time: 10/22/22  1537     History  Chief Complaint  Patient presents with   Back Pain    Joshua Thomas is a 47 y.o. male with history of substance abuse, osteomyelitis lumbar spine, major depressive disorder, polysubstance abuse presents to the emergency department for evaluation of severe back pain.  Has a history of epidural abscess/discitis/osteomyelitis most recently seen 10/13/2022, was admitted at [facility] and discharged on 10/16/2022.  He was discharged on Cipro and Bactrim but after few days quit taking the antibiotics and has been having several days of increasing pain.  Patient states his back felt well while he was on IV antibiotics.  Patient has had several days of chills and severe back pain.  Pain radiates into the hip.  No burning numbness tingling or radicular symptoms.  He has no loss of bowel or bladder symptoms.  He is ambulatory with no assistive vices.  States he has been taking oxycodone for pain.  Patient has had several admissions over the last couple of years for back infection.  Most recent admission on 10/13/2022 resulted in negative blood cultures he was treated with IV vancomycin and cefepime.  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  Infectious disease thought possible contaminant but given his history was placed on Cipro and Bactrim.  IR was asked to aspirate but said that they were unable to access the area of the infection.  Neurosurgery stated this was a nonsurgical case.  HPI     Home Medications Prior to Admission medications   Medication Sig Start Date End Date Taking? Authorizing Provider  Oxycodone HCl 10 MG TABS Take 1 tablet (10 mg total) by mouth every 8 (eight) hours as needed (severe pain). 10/22/22  Yes Evon Slack, PA-C  Buprenorphine HCl-Naloxone HCl (SUBOXONE) 8-2 MG FILM Place 1 Film under the tongue 2 (two) times  daily.    [provider]  Calcium Carbonate Antacid (TUMS PO) Take 2-3 tablets by mouth daily as needed (gerd).    [provider]  ciprofloxacin (CIPRO) 750 MG tablet Take 1 tablet (750 mg total) by mouth 2 (two) times daily. 10/15/22   Blanchard Kelch, NP  cyclobenzaprine (FLEXERIL) 10 MG tablet Take 10 mg by mouth 2 (two) times daily as needed for muscle spasms.    [provider]  divalproex (DEPAKOTE) 250 MG DR tablet Take 250 mg by mouth at bedtime. 03/09/22   [provider]  hydrOXYzine (VISTARIL) 50 MG capsule Take 50 mg by mouth 2 (two) times daily as needed for anxiety.    [provider]  Melatonin 10 MG TABS Take 10 mg by mouth at bedtime.    [provider]  naloxone [facility]) nasal spray 4 mg/0.1 mL Take in case of overdose 08/07/20   Long, Arlyss Repress, MD  prazosin (MINIPRESS) 1 MG capsule Take 1 mg by mouth at bedtime. 07/31/21   [provider]  QUEtiapine (SEROQUEL) 100 MG tablet Take 1 tablet (100 mg total) by mouth at bedtime. 10/16/22   Burnadette Pop, MD  sertraline (ZOLOFT) 50 MG tablet Take 100 mg by mouth daily. Patient not taking: Reported on 10/13/2022    [provider]  sulfamethoxazole-trimethoprim (BACTRIM DS) 800-160 MG tablet Take 2 tablets by mouth 2 (two) times daily. 10/15/22   Blanchard Kelch, NP      Allergies    Tylenol [  acetaminophen] and Morphine    Review of Systems   Review of Systems  Physical Exam Updated Vital Signs BP (!) 159/88   Pulse 97   Temp 98 F (36.7 C) (Oral)   Resp 20   Ht 5\' 3"  (1.6 m)   Wt 72.6 kg   SpO2 97%   BMI 28.34 kg/m  Physical Exam Constitutional:      General: He is in acute distress.     Appearance: He is well-developed. He is not ill-appearing or diaphoretic.     Comments: Antalgic gait with no assistive devices  HENT:     Head: Normocephalic and atraumatic.     Right Ear: External ear normal.     Left Ear: External ear normal.     Nose:  Nose normal.     Mouth/Throat:     Mouth: Mucous membranes are moist.     Pharynx: No oropharyngeal exudate.  Eyes:     Extraocular Movements: Extraocular movements intact.     Conjunctiva/sclera: Conjunctivae normal.     Pupils: Pupils are equal, round, and reactive to light.  Cardiovascular:     Rate and Rhythm: Normal rate and regular rhythm.     Pulses: Normal pulses.     Heart sounds: Normal heart sounds.  Pulmonary:     Effort: Pulmonary effort is normal. No respiratory distress.  Abdominal:     General: Abdomen is flat. Bowel sounds are normal. There is no distension.     Tenderness: There is no abdominal tenderness. There is no right CVA tenderness, left CVA tenderness or guarding.  Musculoskeletal:        General: Normal range of motion.     Cervical back: Normal range of motion.     Comments: Tender along the lower lumbar spine and left paravertebral muscles of the lumbar spine.  No warmth, redness or muscle spasms.  He has full range of motion of the hip with no discomfort.  Tender along the lateral aspect of the left hip.  No swelling or edema in the lower legs.  Normal ankle plantarflexion dorsiflexion and EHL strength bilaterally.  Skin:    General: Skin is warm.     Findings: No rash.  Neurological:     General: No focal deficit present.     Mental Status: He is alert and oriented to person, place, and time. Mental status is at baseline.     Cranial Nerves: No cranial nerve deficit.  Psychiatric:        Behavior: Behavior normal.        Thought Content: Thought content normal.     ED Results / Procedures / Treatments   Labs (all labs ordered are listed, but only abnormal results are displayed) Labs Reviewed  URINE DRUG SCREEN, QUALITATIVE ([facility] ONLY) - Abnormal; Notable for the following components:      Result Value   Tricyclic, Ur Screen POSITIVE (*)    All other components within normal limits  URINALYSIS, ROUTINE W REFLEX MICROSCOPIC - Abnormal; Notable  for the following components:   Color, Urine YELLOW (*)    APPearance HAZY (*)    Glucose, UA >=500 (*)    Protein, ur 30 (*)    Bacteria, UA RARE (*)    All other components within normal limits  COMPREHENSIVE METABOLIC PANEL - Abnormal; Notable for the following components:   Glucose, Bld 255 (*)    Total Protein 8.7 (*)    All other components within normal limits  URINE CULTURE  CULTURE, BLOOD (ROUTINE X 2)  CULTURE, BLOOD (ROUTINE X 2)  LACTIC ACID, PLASMA  CBC WITH DIFFERENTIAL/PLATELET  SEDIMENTATION RATE  C-REACTIVE PROTEIN    EKG None  Radiology MR Lumbar Spine W Wo Contrast  Result Date: 10/22/2022 CLINICAL DATA:  Initial evaluation for discitis, abscess. EXAM: MRI LUMBAR SPINE WITHOUT AND WITH CONTRAST TECHNIQUE: Multiplanar and multiecho pulse sequences of the lumbar spine were obtained without and with intravenous contrast. CONTRAST:  7mL GADAVIST GADOBUTROL 1 MMOL/ML IV SOLN COMPARISON:  Prior MRI from 10/13/2022 as well as earlier exams. FINDINGS: Segmentation:  Standard. Alignment: Mild levoscoliosis. 2 mm degenerative retrolisthesis of L1 on L2. Appearance is stable. Vertebrae: Mild chronic compression deformity at the superior endplate of L1, stable. Mild chronic height loss at the L2 vertebral body, also stable. No acute or interval fracture. Underlying bone marrow signal intensity within normal limits. Chronic changes of prior osteomyelitis discitis at L2-3 with severe disc space height loss and near obliteration of the L2-3 interspace, stable. Residual mild endplate enhancement without convincing evidence for persistent or recurrent infection at this level. Marrow edema and enhancement at the superior endplate of L2 felt to be reactive in nature due to a Schmorl's node deformity at this level. No convincing infection at this location. Intervertebral disc space narrowing with endplate irregularity at L5-S1 at site of previously seen osteomyelitis discitis. Mild residual  endplate enhancement without evidence for new or progressive infection at this level. No other evidence for new or distant infection elsewhere within the lumbar spine. Underlying bone marrow signal intensity within normal limits. No worrisome osseous lesions. No other abnormal marrow edema or enhancement. Conus medullaris and cauda equina: Conus extends to the L2 level. Conus and cauda equina appear normal. Paraspinal and other soft tissues: Paraspinous soft tissues demonstrate no acute finding. No collections. Few scattered T2 hyperintense cyst noted about the bilateral kidneys, largest of which measures 1.4 cm on the left. These are benign in appearance, with no follow-up imaging recommended. Note also made of a 8 mm cyst within the interpolar right kidney that demonstrates some intrinsic T1 hyperintensity, likely a small proteinaceous and/or hemorrhagic cyst, but incompletely assessed on this exam. Disc levels: L1-2: Degenerative vertebral disc space narrowing with diffuse disc bulge, asymmetric to the left. Associated reactive endplate spurring. Superimposed small central disc extrusion with inferior migration. Mild facet hypertrophy. Resultant mild narrowing of the left lateral recess. Central canal remains patent. Mild right with moderate left L1 foraminal stenosis. L2-3: Advance intervertebral disc space narrowing with near obliteration of the L2-3 interspace. Associated endplate spurring and irregularity. Mild facet hypertrophy. No significant spinal stenosis. Mild bilateral L2 foraminal narrowing. L3-4: Left foraminal to extraforaminal disc protrusion contacts the exiting left L3 nerve root. Mild to moderate bilateral facet hypertrophy. No significant spinal stenosis. Mild to moderate left L3 foraminal narrowing. Right neural foramen remains patent. L4-5: Disc desiccation with minimal disc bulge. Mild to moderate facet hypertrophy, slightly worse on the left. No spinal stenosis. Mild bilateral L4 foraminal  narrowing. L5-S1: Degenerative intervertebral disc space narrowing with disc desiccation. Endplate irregularity with marginal endplate spurring. Superimposed small right foraminal disc protrusion. Mild to moderate facet hypertrophy. Prior left hemi laminectomy. No residual spinal stenosis. Moderate left L5 foraminal stenosis. Right neural foramen remains patent. IMPRESSION: 1. No MRI evidence for new or recurrent infection within the lumbar spine. 2. Chronic changes of prior osteomyelitis discitis at L2-3 and L5-S1. Residual mild endplate enhancement, but no other convincing evidence for persistent or recurrent infection at these  levels. 3. Left foraminal to extraforaminal disc protrusion at L3-4, potentially affecting the exiting left L3 nerve root. 4. Left eccentric disc bulge with facet hypertrophy at L1-2 with resultant mild left lateral recess and moderate left L1 foraminal stenosis. 5. Prior left hemi laminectomy at L5-S1 without residual spinal stenosis. Moderate left L5 foraminal narrowing related to disc bulge, endplate spurring, and facet hypertrophy. 6. 8 mm T1 hyperintense right renal cyst. While this likely reflects a proteinaceous and/or hemorrhagic cyst, this is incompletely assessed on this exam. Correlation with renal ultrasound recommended. Electronically Signed   By: Rise Mu M.D.   On: 10/22/2022 20:53   DG Lumbar Spine 2-3 Views  Result Date: 10/22/2022 CLINICAL DATA:  Acute low back pain.  History of spinal infection. EXAM: LUMBAR SPINE - 2-3 VIEW COMPARISON:  MRI 10/13/2022 FINDINGS: Five non-rib-bearing lumbar vertebra. Complete disc space loss at L2-L3 is unchanged from prior exam. Endplate irregularity at the L5-S1 disc space is again seen. Mild chronic L1 compression deformity, stable. No acute fracture. IMPRESSION: 1. Stable sequela of discitis osteomyelitis at L2-L3. 2. Endplate irregularity at L5-S1 representing sequela of infection. 3. No acute radiographic findings  compared to recent MRI, although MRI is more sensitive for detection of infection. Electronically Signed   By: Narda Rutherford M.D.   On: 10/22/2022 18:13   DG Hip Unilat W or Wo Pelvis 2-3 Views Left  Result Date: 10/22/2022 CLINICAL DATA:  Left hip pain. EXAM: DG HIP (WITH OR WITHOUT PELVIS) 2-3V LEFT COMPARISON:  None Available. FINDINGS: No acute fracture of the pelvis or left hip. The cortical margins are intact. Normal left hip joint space, no hip dislocation. No erosion or focal bone abnormality. The sacroiliac joints and pubic symphysis are congruent. No focal soft tissue abnormalities. IMPRESSION: Negative radiographs of the pelvis and left hip. Electronically Signed   By: Narda Rutherford M.D.   On: 10/22/2022 18:11    Procedures Procedures    Medications Ordered in ED Medications  HYDROmorphone (DILAUDID) injection 1 mg (1 mg Intravenous Given 10/22/22 1756)  ondansetron (ZOFRAN) injection 4 mg (4 mg Intravenous Given 10/22/22 1756)  lactated ringers bolus 1,000 mL (1,000 mLs Intravenous New Bag/Given 10/22/22 1759)  diazepam (VALIUM) injection 5 mg (5 mg Intravenous Given 10/22/22 1929)  gadobutrol (GADAVIST) 1 MMOL/ML injection 7 mL (7 mLs Intravenous Contrast Given 10/22/22 1953)  oxyCODONE (Oxy IR/ROXICODONE) immediate release tablet 10 mg (10 mg Oral Given 10/22/22 2145)    ED Course/ Medical Decision Making/ A&P                                 Medical Decision Making Amount and/or Complexity of Data Reviewed Labs: ordered. Radiology: ordered.  Risk Prescription drug management.   47 year old male with history of chronic infections in his lower lumbar spine.  Has had several admissions to the hospital.  Most recently was discharged 6 days ago on oral antibiotics.  He was evaluated by neurosurgery and infectious disease.  He states that he has been taking his antibiotics for a few days but then discontinued due to some stomach irritation.  He denies any fevers.   States he has had some chills with increasing lower back pain.  His blood work today shows no elevated white count, sed rate within normal limits and at baseline.  His lactic acid within normal limits.  His vital signs are stable, afebrile nontachycardic showing no signs of sepsis.  A  repeat MRI today of the lumbar spine showed no evidence of new or recurrent infection along the lumbar spine.  Left hip x-rays negative for any infectious process or acute bony abnormality.  No abnormal bony lesions.  No neurological deficits in the lower extremities.  Patient ambulatory with no assistive vices.  Patient with chronic signs of prior discitis and osteomyelitis.  Patient advised to restart his Cipro and Bactrim as prescribed.  He will make sure he is taking this medication with food and will stagger the medications of these not taken both medications at the same time.  He is given a small amount of oxycodone for pain.  He is encouraged to follow-up with infectious disease.  He is given strict return precautions to return to the ER for.  Patient stable and ready for discharge to home. Final Clinical Impression(s) / ED Diagnoses Final diagnoses:  Chronic midline low back pain without sciatica  Discitis of lumbar region, chronic, stable    Rx / DC Orders ED Discharge Orders          Ordered    Oxycodone HCl 10 MG TABS  Every 8 hours PRN        10/22/22 2155              Evon Slack, PA-C 10/22/22 2200    Corena Herter, MD 10/25/22 1330

## 2022-10-22 NOTE — ED Notes (Signed)
Discharge instructions reviewed with patient. Patient questions answered and opportunity for education reviewed. Patient voices understanding of discharge instructions with no further questions. Patient ambulatory with steady gait to lobby.  

## 2022-10-22 NOTE — ED Triage Notes (Signed)
Pt via POV from home. Pt states that he has a "back infection in his spine" for the past couple months. States before this he has been nauseous but the abx has made it worse so he has not be able to complete the course. Pt is A&Ox4 and NAD

## 2022-10-22 NOTE — Discharge Instructions (Signed)
Please continue with Cipro and Bactrim as prescribed.  Please stagger the medications as discussed.  Please make sure you are taking antibiotics with food on your stomach.  Please follow-up with infectious disease.  Return to the ER for any fevers increasing pain, worsening symptoms or any urgent changes in your health

## 2022-10-22 NOTE — Telephone Encounter (Signed)
Called patient to reschedule missed appointment. No answer left voice message to contact clinic.

## 2022-10-23 LAB — C-REACTIVE PROTEIN: CRP: 0.9 mg/dL (ref <1.0)

## 2022-10-24 LAB — URINE CULTURE: Culture: NO GROWTH

## 2022-10-27 LAB — CULTURE, BLOOD (ROUTINE X 2)
Culture: NO GROWTH
Culture: NO GROWTH
Special Requests: ADEQUATE

## 2022-12-06 ENCOUNTER — Other Ambulatory Visit: Payer: Self-pay

## 2022-12-06 ENCOUNTER — Emergency Department (HOSPITAL_COMMUNITY)
Admission: EM | Admit: 2022-12-06 | Discharge: 2022-12-07 | Disposition: A | Discharge status: Home / Self Care | Payer: MEDICAID | Attending: Emergency Medicine | Admitting: Emergency Medicine

## 2022-12-06 ENCOUNTER — Encounter (HOSPITAL_COMMUNITY): Payer: Self-pay | Admitting: Radiology

## 2022-12-06 ENCOUNTER — Ambulatory Visit (HOSPITAL_COMMUNITY): Payer: MEDICAID

## 2022-12-06 DIAGNOSIS — Z8739 Personal history of other diseases of the musculoskeletal system and connective tissue: Secondary | ICD-10-CM

## 2022-12-06 DIAGNOSIS — M4640 Discitis, unspecified, site unspecified: Secondary | ICD-10-CM | POA: Insufficient documentation

## 2022-12-06 DIAGNOSIS — J449 Chronic obstructive pulmonary disease, unspecified: Secondary | ICD-10-CM | POA: Insufficient documentation

## 2022-12-06 DIAGNOSIS — F17210 Nicotine dependence, cigarettes, uncomplicated: Secondary | ICD-10-CM | POA: Diagnosis not present

## 2022-12-06 DIAGNOSIS — M546 Pain in thoracic spine: Secondary | ICD-10-CM | POA: Diagnosis not present

## 2022-12-06 DIAGNOSIS — I10 Essential (primary) hypertension: Secondary | ICD-10-CM | POA: Diagnosis not present

## 2022-12-06 DIAGNOSIS — E119 Type 2 diabetes mellitus without complications: Secondary | ICD-10-CM | POA: Diagnosis not present

## 2022-12-06 DIAGNOSIS — M545 Low back pain, unspecified: Secondary | ICD-10-CM | POA: Diagnosis present

## 2022-12-06 LAB — COMPREHENSIVE METABOLIC PANEL
ALT: 28 U/L (ref 0–44)
AST: 14 U/L — ABNORMAL LOW (ref 15–41)
Albumin: 3.9 g/dL (ref 3.5–5.0)
Alkaline Phosphatase: 53 U/L (ref 38–126)
Anion gap: 9 (ref 5–15)
BUN: 13 mg/dL (ref 6–20)
CO2: 22 mmol/L (ref 22–32)
Calcium: 9.1 mg/dL (ref 8.9–10.3)
Chloride: 101 mmol/L (ref 98–111)
Creatinine, Ser: 0.92 mg/dL (ref 0.61–1.24)
GFR, Estimated: >60 mL/min (ref >60)
Glucose, Bld: 428 mg/dL — ABNORMAL HIGH (ref 70–99)
Potassium: 3.5 mmol/L (ref 3.5–5.1)
Sodium: 132 mmol/L — ABNORMAL LOW (ref 135–145)
Total Bilirubin: 0.7 mg/dL (ref <1.2)
Total Protein: 7.4 g/dL (ref 6.5–8.1)

## 2022-12-06 LAB — CBC WITH DIFFERENTIAL/PLATELET
Abs Immature Granulocytes: 0 10*3/uL (ref 0.00–0.07)
Basophils Absolute: 0 10*3/uL (ref 0.0–0.1)
Basophils Relative: 0 %
Eosinophils Absolute: 0 10*3/uL (ref 0.0–0.5)
Eosinophils Relative: 1 %
HCT: 43.1 % (ref 39.0–52.0)
Hemoglobin: 14.2 g/dL (ref 13.0–17.0)
Immature Granulocytes: 0 %
Lymphocytes Relative: 44 %
Lymphs Abs: 2 10*3/uL (ref 0.7–4.0)
MCH: 30.7 pg (ref 26.0–34.0)
MCHC: 32.9 g/dL (ref 30.0–36.0)
MCV: 93.3 fL (ref 80.0–100.0)
Monocytes Absolute: 0.4 10*3/uL (ref 0.1–1.0)
Monocytes Relative: 8 %
Neutro Abs: 2.2 10*3/uL (ref 1.7–7.7)
Neutrophils Relative %: 47 %
Platelets: 133 10*3/uL — ABNORMAL LOW (ref 150–400)
RBC: 4.62 MIL/uL (ref 4.22–5.81)
RDW: 13.4 % (ref 11.5–15.5)
WBC: 4.5 10*3/uL (ref 4.0–10.5)
nRBC: 0 % (ref 0.0–0.2)

## 2022-12-06 LAB — CBG MONITORING, ED: Glucose-Capillary: 201 mg/dL — ABNORMAL HIGH (ref 70–99)

## 2022-12-06 LAB — C-REACTIVE PROTEIN: CRP: 1.3 mg/dL — ABNORMAL HIGH (ref <1.0)

## 2022-12-06 LAB — SEDIMENTATION RATE: Sed Rate: 2 mm/h (ref 0–16)

## 2022-12-06 MED ORDER — GADOBUTROL 1 MMOL/ML IV SOLN
7.0000 mL | Freq: Once | INTRAVENOUS | Status: AC | PRN
  Administered 2022-12-06: 7 mL via INTRAVENOUS

## 2022-12-06 MED ORDER — KETOROLAC TROMETHAMINE 15 MG/ML IJ SOLN
15.0000 mg | Freq: Once | INTRAMUSCULAR | Status: AC
  Administered 2022-12-06: 15 mg via INTRAVENOUS

## 2022-12-06 MED ORDER — LORAZEPAM 2 MG/ML IJ SOLN
0.5000 mg | Freq: Once | INTRAMUSCULAR | Status: AC
  Administered 2022-12-06: 0.5 mg via INTRAVENOUS
  Filled 2022-12-06: qty 1

## 2022-12-06 MED ORDER — KETOROLAC TROMETHAMINE 15 MG/ML IJ SOLN
15.0000 mg | Freq: Once | INTRAMUSCULAR | Status: DC
  Filled 2022-12-06: qty 1

## 2022-12-06 MED ORDER — OXYCODONE HCL 5 MG PO TABS
5.0000 mg | ORAL_TABLET | Freq: Once | ORAL | Status: AC
  Administered 2022-12-06: 5 mg via ORAL
  Filled 2022-12-06: qty 1

## 2022-12-06 MED ORDER — SODIUM CHLORIDE 0.9 % IV BOLUS
1000.0000 mL | Freq: Once | INTRAVENOUS | Status: AC
  Administered 2022-12-06: 1000 mL via INTRAVENOUS

## 2022-12-06 MED ORDER — ONDANSETRON HCL 4 MG PO TABS: 4.0000 mg | ORAL_TABLET | Freq: Three times a day (TID) | ORAL | 0 refills | Status: AC | PRN

## 2022-12-06 NOTE — ED Triage Notes (Signed)
Pt states he has had a infection in his spine in the past and he isnt sure if his pain is coming from that or preexisting back injuries. Pt states he isnt sure when the pain started because he has chronic back pain.

## 2022-12-06 NOTE — ED Notes (Signed)
Patient transported to MRI 

## 2022-12-06 NOTE — ED Provider Notes (Signed)
[place] EMERGENCY DEPARTMENT AT [facility] Provider Note  CSN: [id number] Arrival date & time: 12/06/22 1620  Chief Complaint(s) Back Pain  HPI Joshua Thomas is a 47 y.o. 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  He reports that he has chronic back pain but has been worsening over the past few weeks.  He reports that he has been having some night sweats.  No fevers or chills.  No new bowel or bladder incontinence, weakness.  Denies any ongoing IV drug use.  He reports that he was supposed to be taking Cipro and Bactrim however he has not been taking these medications as it causes him to have stomach upset and nausea.  No urinary symptoms such as dysuria.  He has been ambulatory.  No back trauma.   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   COPD (chronic obstructive pulmonary disease) (HCC)    DDD (degenerative disc disease), lumbar    Degenerative disc disease, lumbar    Depression    Diabetes mellitus without complication (HCC)    Discitis    GERD (gastroesophageal reflux disease)    Hepatitis C    Hepatitis C    History of kidney stones    History of stomach ulcers    Hx of diabetes mellitus    due to infection   Hypertension    Lung nodules    3 on L and 2 on R   PTSD (post-traumatic stress disorder)    Sciatica    Substance abuse (HCC)    Patient Active Problem List   Diagnosis Date Noted   Osteomyelitis of lumbar spine (HCC) 10/13/2022   Polysubstance abuse (HCC) 09/03/2022   Substance induced mood disorder (HCC) 07/29/2022   Right inguinal hernia 05/24/2022   Abdominal adhesions 05/24/2022   Epidural abscess 10/08/2021   Diabetes mellitus (HCC)  10/08/2021   Bilateral low back pain with left-sided sciatica    Acute on chronic osteomyelitis (HCC) 07/04/2021   Opioid use disorder, severe, dependence (HCC) 12/15/2020   Hepatitis C 12/15/2020   MDD (major depressive disorder), recurrent severe, 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 Authorizing Provider  ondansetron (ZOFRAN) 4 MG tablet Take 1 tablet (4 mg total) by mouth every 8 (eight) hours as needed for up to 12 days for nausea or vomiting. 12/06/22 12/18/22 Yes Lonell Grandchild, MD  oxycodone (OXY-IR) 5 MG capsule Take 1 capsule (5 mg total) by mouth every 4 (four) hours as needed. 12/07/22  Yes Delo, Riley Lam, MD  Buprenorphine HCl-Naloxone HCl (SUBOXONE) 8-2 MG FILM Place 1 Film under the tongue 2 (two) times daily.    [provider]  Calcium Carbonate Antacid (TUMS PO) Take 2-3 tablets by mouth daily as needed (gerd).    [provider]  ciprofloxacin (CIPRO) 750 MG tablet Take 1 tablet (  750 mg total) by mouth 2 (two) times daily. 10/15/22   Blanchard Kelch, NP  cyclobenzaprine (FLEXERIL) 10 MG tablet Take 10 mg by mouth 2 (two) times daily as needed for muscle spasms.    [provider]  divalproex (DEPAKOTE) 250 MG DR tablet Take 250 mg by mouth at bedtime. 03/09/22   [provider]  hydrOXYzine (VISTARIL) 50 MG capsule Take 50 mg by mouth 2 (two) times daily as needed for anxiety.    [provider]  Melatonin 10 MG TABS Take 10 mg by mouth at bedtime.    [provider]  naloxone [facility]) nasal spray 4 mg/0.1 mL Take in case of overdose 08/07/20   Long,  Arlyss Repress, MD  prazosin (MINIPRESS) 1 MG capsule Take 1 mg by mouth at bedtime. 07/31/21   [provider]  QUEtiapine (SEROQUEL) 100 MG tablet Take 1 tablet (100 mg total) by mouth at bedtime. 10/16/22   Burnadette Pop, MD  sertraline (ZOLOFT) 50 MG tablet Take 100 mg by mouth daily. Patient not taking: Reported on 10/13/2022    [provider]  sulfamethoxazole-trimethoprim (BACTRIM DS) 800-160 MG tablet Take 2 tablets by mouth 2 (two) times daily. 10/15/22   Blanchard Kelch, NP                                                                                                                                    Past Surgical History Past Surgical History:  Procedure Laterality Date   FRACTURE SURGERY     HARDWARE REMOVAL  01/27/2012   Procedure: HARDWARE REMOVAL;  Surgeon: Kathryne Hitch, MD;  Location: [facility];  Service: Orthopedics;  Laterality: Left;   IR LUMBAR DISC ASPIRATION W/IMG GUIDE  06/08/2017   IR LUMBAR DISC ASPIRATION W/IMG GUIDE  07/06/2021   LAPAROSCOPIC LYSIS OF ADHESIONS  05/24/2022   Procedure: LAPAROSCOPIC LYSIS OF ADHESIONS;  Surgeon: Franky Macho, MD;  Location: [facility];  Service: General;;   RADIOLOGY WITH ANESTHESIA N/A 06/08/2017   Procedure: DISC ASPIRATION;  Surgeon: Julieanne Cotton, MD;  Location: [facility];  Service: Radiology;  Laterality: N/A;   TIBIA IM NAIL INSERTION  01/27/2012   Procedure: INTRAMEDULLARY (IM) NAIL TIBIAL;  Surgeon: Kathryne Hitch, MD;  Location: [facility];  Service: Orthopedics;  Laterality: Left;  Removal of IM Rod and Screws Left Tibia with Exchange Nail, Allograft Bone Graft Left Tibia   XI ROBOTIC ASSISTED INGUINAL HERNIA REPAIR WITH MESH Right 05/24/2022   Procedure: XI ROBOTIC ASSISTED INGUINAL HERNIA REPAIR WITH MESH;  Surgeon: Franky Macho, MD;  Location: [facility];  Service: General;  Laterality: Right;   Family History Family History  Problem Relation Age of Onset   Diabetes Mother    Alcohol abuse Father     Cirrhosis Father    Aneurysm Father     Social History Social History   Tobacco Use   Smoking status: Every Day  Current packs/day: 1.00    Average packs/day: 1 pack/day for 33.0 years (33.0 ttl pk-yrs)    Types: Cigarettes   Smokeless tobacco: Former    Quit date: 08/08/2006   Tobacco comments:    1 PPD  Vaping Use   Vaping status: Every Day  Substance Use Topics   Alcohol use: Not Currently    Alcohol/week: 1.0 standard drink of alcohol    Types: 1 Standard drinks or equivalent per week    Comment: last used July 2023   Drug use: Not Currently    Types: IV, Fentanyl, Benzodiazepines    Comment: last used July 2023   Allergies Tylenol [acetaminophen] and Morphine  Review of Systems Review of Systems  All other systems reviewed and are negative.   Physical Exam Vital Signs  I have reviewed the triage vital signs BP (!) 150/117   Pulse 97   Temp 97.8 F (36.6 C)   Resp 17   Ht 5\' 3"  (1.6 m)   Wt 72.6 kg   SpO2 96%   BMI 28.34 kg/m  Physical Exam Vitals and nursing note reviewed.  Constitutional:      General: He is not in acute distress.    Appearance: Normal appearance.  HENT:     Mouth/Throat:     Mouth: Mucous membranes are moist.  Eyes:     Conjunctiva/sclera: Conjunctivae normal.  Cardiovascular:     Rate and Rhythm: Normal rate and regular rhythm.  Pulmonary:     Effort: Pulmonary effort is normal. No respiratory distress.     Breath sounds: Normal breath sounds.  Abdominal:     General: Abdomen is flat.     Palpations: Abdomen is soft.     Tenderness: There is no abdominal tenderness.  Musculoskeletal:     Right lower leg: No edema.     Left lower leg: No edema.     Comments: Midline lumbar tenderness without overlying skin change  Skin:    General: Skin is warm and dry.     Capillary Refill: Capillary refill takes less than 2 seconds.  Neurological:     Mental Status: He is alert and oriented to person, place, and time. Mental status  is at baseline.     Comments: Strength 5 out of 5 in the bilateral lower extremities with hip flexion, knee extension, ankle dorsi/plantarflexion.  2+ patellar reflex bilaterally.  Antalgic gait.  Psychiatric:        Mood and Affect: Mood normal.        Behavior: Behavior normal.     ED Results and Treatments Labs (all labs ordered are listed, but only abnormal results are displayed) Labs Reviewed  COMPREHENSIVE METABOLIC PANEL - Abnormal; Notable for the following components:      Result Value   Sodium 132 (*)    Glucose, Bld 428 (*)    AST 14 (*)    All other components within normal limits  CBC WITH DIFFERENTIAL/PLATELET - Abnormal; Notable for the following components:   Platelets 133 (*)    All other components within normal limits  C-REACTIVE PROTEIN - Abnormal; Notable for the following components:   CRP 1.3 (*)    All other components within normal limits  CBG MONITORING, ED - Abnormal; Notable for the following components:   Glucose-Capillary 201 (*)    All other components within normal limits  SEDIMENTATION RATE  Radiology MR Lumbar Spine W Wo Contrast  Result Date: 12/07/2022 CLINICAL DATA:  Initial evaluation for low back pain, infection suspected. EXAM: MRI LUMBAR SPINE WITHOUT AND WITH CONTRAST TECHNIQUE: Multiplanar and multiecho pulse sequences of the lumbar spine were obtained without and with intravenous contrast. CONTRAST:  7mL GADAVIST GADOBUTROL 1 MMOL/ML IV SOLN COMPARISON:  MRI from 10/22/2022. FINDINGS: Segmentation: Standard. Lowest well-formed disc space labeled the L5-S1 level. Alignment: Mild levoscoliosis. Trace 2 mm degenerative retrolisthesis of L1 on L2, stable. Vertebrae: Chronic compression deformity of L1, stable. Chronic height loss at L2, also unchanged. No acute or interval fracture. Underlying bone marrow signal intensity  stable and within normal limits. No worrisome osseous lesions. Chronic changes of prior osteomyelitis discitis at L2-3 noted, stable. No evidence for persistent or recurrent infection at this level on today's exam. Marrow edema and enhancement about the L1-2 interspace felt to be reactive in nature due to degenerative endplate Schmorl's node deformities at this level, stable. Additional chronic changes of prior osteomyelitis discitis at L5-S1. Mild residual endplate enhancement at this level without evidence for new or progressive infection, similar to prior. No other evidence for new infection elsewhere within the lumbar spine. Conus medullaris and cauda equina: Conus extends to the L2 level. Conus and cauda equina appear normal. Paraspinal and other soft tissues: Paraspinous soft tissues demonstrate no acute finding. Again seen are multiple scattered T2 hyperintense cysts about the kidneys bilaterally, benign in appearance, with no follow-up imaging recommended. 9 mm T1 hyperintense lesion within the interpolar right kidney, nonspecific, but could reflect a proteinaceous and/or hemorrhagic cyst. Finding is stable from prior. Disc levels: L1-2: Diffuse disc bulge with reactive endplate spurring. Superimposed small central disc extrusion with inferior migration. Mild facet hypertrophy. Mild narrowing of the left lateral recess. Central canal remains patent. Moderate left L1 foraminal stenosis. Appearance is stable. L2-3: Advanced intervertebral disc space narrowing with endplate spurring and irregularity. Mild facet hypertrophy. No spinal stenosis. Mild bilateral L2 foraminal narrowing. L3-4: Left foraminal to extraforaminal disc protrusion contacts the exiting left L3 nerve root. Moderate bilateral facet hypertrophy. No spinal stenosis. Mild to moderate left foraminal narrowing. Right neural foramina remains patent. L4-5: Disc desiccation with minimal disc bulge. Moderate facet hypertrophy. No spinal stenosis. Mild  bilateral L4 foraminal narrowing. L5-S1: Degenerative intervertebral disc space narrowing with endplate irregularity and spurring. Tiny right foraminal disc protrusion again noted. Prior left hemi laminectomy. Mild to moderate facet hypertrophy. No spinal stenosis. Moderate left L5 foraminal narrowing. Right neural foramina remains patent. IMPRESSION: 1. No acute abnormality within the lumbar spine. No evidence for recurrent or new infection. 2. Chronic changes of prior osteomyelitis discitis at L2-3 and L5-S1, stable. 3. Multilevel lumbar spondylosis as above, stable as compared to prior MRI from 10/22/2022. No significant spinal stenosis. 4. Multifactorial degenerative changes with resultant multilevel foraminal narrowing as above. Notable findings include moderate left L1 foraminal stenosis, mild to moderate left L3 foraminal narrowing, with moderate left L5 foraminal stenosis. 5. 9 mm T1 hyperintense right renal cyst. While this could reflect a proteinaceous and/or hemorrhagic cyst, this is incompletely assessed on this exam. Correlation with renal ultrasound recommended if not already performed. Electronically Signed   By: Rise Mu M.D.   On: 12/07/2022 00:06   MR THORACIC SPINE W WO CONTRAST  Result Date: 12/06/2022 CLINICAL DATA:  Initial evaluation for mid back pain, infection suspected. EXAM: MRI THORACIC WITHOUT AND WITH CONTRAST TECHNIQUE: Multiplanar and multiecho pulse sequences of the thoracic spine were obtained without and with  intravenous contrast. CONTRAST:  7mL GADAVIST GADOBUTROL 1 MMOL/ML IV SOLN COMPARISON:  None available. FINDINGS: Alignment: Trace dextroscoliosis. Alignment otherwise normal with preservation of the normal thoracic kyphosis. No listhesis. Vertebrae: Chronic L1 compression fracture, described on corresponding MRI of the lumbar spine. Vertebral body height otherwise maintained with no other acute or chronic fracture. Bone marrow signal intensity within normal  limits. No worrisome osseous lesions. Mild reactive edema and enhancement present about the T10-11 interspace. While this finding is favored to be degenerative in nature, possible changes of mild and/or early osteomyelitis discitis are difficult to exclude by MRI. No other evidence for acute infection elsewhere within the thoracic spine. Cord:  Normal signal and morphology.  No epidural collections. Paraspinal and other soft tissues: Paraspinous soft tissues demonstrate no acute finding. Few small T2 hyperintense cyst noted about the visualized kidneys, benign in appearance, no follow-up imaging recommended. Disc levels: T1-2: Negative interspace. Mild right facet hypertrophy. No stenosis. T2-3: Mild disc bulge with right-sided facet hypertrophy. No stenosis. T3-4: Negative interspace. Mild right-sided facet hypertrophy. No stenosis. T4-5: Negative interspace. Mild right facet hypertrophy. No stenosis. T5-6: Minimal disc bulge. Mild right facet hypertrophy. No stenosis. T6-7: Mild disc bulge. Mild left-sided facet hypertrophy. No significant stenosis. T7-8: Mild disc bulge with superimposed small right paracentral to foraminal disc protrusion. No stenosis. T8-9: Right paracentral disc protrusion indents the right ventral thecal sac. No significant stenosis. T9-10: Mild disc bulge with bilateral facet hypertrophy. No significant stenosis. T10-11: Degenerative intervertebral disc space narrowing with diffuse disc bulge and reactive endplate changes. Mild bilateral facet hypertrophy. No significant spinal stenosis. Moderate bilateral foraminal narrowing. T11-12: Mild disc bulge with reactive endplate spurring. Bilateral facet hypertrophy. No significant spinal stenosis. Foramina remain patent. T12-L1:  Negative interspace.  Mild facet hypertrophy.  No stenosis. IMPRESSION: 1. Minor reactive edema and enhancement about the T10-11 interspace. While this finding is favored to be degenerative in nature, possible changes of  mild and/or early osteomyelitis discitis are difficult to exclude by MRI. Correlation with laboratory values and physical exam recommended. If clinical picture is equivocal, a short interval follow-up MRI to evaluate for interval changes may be helpful for further evaluation as warranted. 2. No other evidence for acute infection elsewhere within the thoracic spine. 3. Multilevel thoracic spondylosis without significant spinal stenosis. Moderate bilateral foraminal narrowing at T10-11. Electronically Signed   By: Rise Mu M.D.   On: 12/06/2022 23:52    Pertinent labs & imaging results that were available during my care of the patient were reviewed by me and considered in my medical decision making (see MDM for details).  Medications Ordered in ED Medications  sodium chloride 0.9 % bolus 1,000 mL (0 mLs Intravenous Stopped 12/06/22 2016)  oxyCODONE (Oxy IR/ROXICODONE) immediate release tablet 5 mg (5 mg Oral Given 12/06/22 1835)  LORazepam (ATIVAN) injection 0.5 mg (0.5 mg Intravenous Given 12/06/22 2017)  gadobutrol (GADAVIST) 1 MMOL/ML injection 7 mL (7 mLs Intravenous Contrast Given 12/06/22 2100)  ketorolac (TORADOL) 15 MG/ML injection 15 mg (15 mg Intravenous Given 12/06/22 2018)  oxyCODONE (Oxy IR/ROXICODONE) immediate release tablet 5 mg (5 mg Oral Given 12/06/22 2111)  Procedures Procedures  (including critical care time)  Medical Decision Making / ED Course   MDM:  47 year old presenting to the emergency department with back pain.  Patient well-appearing, physical examination with midline back tenderness.  He does seem in some discomfort.  He does have a very complicated history with multiple spinal infections.  Current pain could be due to current infection.  He does report noncompliance with his chronic antibacterial therapy as it causes  him to have nausea and vomiting.  He denies any ongoing IV drug use.  Pain could also be related to his chronic spinal injuries from prior infection.  Very difficult to tell for sure.  Will obtain MRI as well as labs including CRP and ESR. Clinical Course as of 12/07/22 1528  Mon Dec 06, 2022  2323 Signed out to Dr. Judd Lien pending MRI results.  [WS]    Clinical Course User Index [WS] Lonell Grandchild, MD     Additional history obtained: -External records from outside source obtained and reviewed including: Chart review including previous notes, labs, imaging, consultation notes including prior ER visit for previous    Lab Tests: -I ordered, reviewed, and interpreted labs.   The pertinent results include:   Labs Reviewed  COMPREHENSIVE METABOLIC PANEL - Abnormal; Notable for the following components:      Result Value   Sodium 132 (*)    Glucose, Bld 428 (*)    AST 14 (*)    All other components within normal limits  CBC WITH DIFFERENTIAL/PLATELET - Abnormal; Notable for the following components:   Platelets 133 (*)    All other components within normal limits  C-REACTIVE PROTEIN - Abnormal; Notable for the following components:   CRP 1.3 (*)    All other components within normal limits  CBG MONITORING, ED - Abnormal; Notable for the following components:   Glucose-Capillary 201 (*)    All other components within normal limits  SEDIMENTATION RATE    Notable for hyperglycemia. Normal esr. Barely elevated CRP  Imaging Studies ordered: I ordered imaging studies including MRI back  On my interpretation imaging demonstrates pending at sign out  I independently visualized and interpreted imaging. I agree with the radiologist interpretation   Medicines ordered and prescription drug management: Meds ordered this encounter  Medications   sodium chloride 0.9 % bolus 1,000 mL   oxyCODONE (Oxy IR/ROXICODONE) immediate release tablet 5 mg   DISCONTD: ketorolac (TORADOL) 15 MG/ML  injection 15 mg   LORazepam (ATIVAN) injection 0.5 mg   gadobutrol (GADAVIST) 1 MMOL/ML injection 7 mL   ketorolac (TORADOL) 15 MG/ML injection 15 mg   oxyCODONE (Oxy IR/ROXICODONE) immediate release tablet 5 mg   ondansetron (ZOFRAN) 4 MG tablet    Sig: Take 1 tablet (4 mg total) by mouth every 8 (eight) hours as needed for up to 12 days for nausea or vomiting.    Dispense:  12 tablet    Refill:  0   oxycodone (OXY-IR) 5 MG capsule    Sig: Take 1 capsule (5 mg total) by mouth every 4 (four) hours as needed.    Dispense:  15 capsule    Refill:  0    -I have reviewed the patients home medicines and have made adjustments as needed  Social Determinants of Health:  Diagnosis or treatment significantly limited by social determinants of health: polysubstance abuse   Reevaluation: After the interventions noted above, I reevaluated the patient and found that their symptoms have improved  Co morbidities that  complicate the patient evaluation  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   COPD (chronic obstructive pulmonary disease) (HCC)    DDD (degenerative disc disease), lumbar    Degenerative disc disease, lumbar    Depression    Diabetes mellitus without complication (HCC)    Discitis    GERD (gastroesophageal reflux disease)    Hepatitis C    Hepatitis C    History of kidney stones    History of stomach ulcers    Hx of diabetes mellitus    due to infection   Hypertension    Lung nodules    3 on L and 2 on R   PTSD (post-traumatic stress disorder)    Sciatica    Substance abuse (HCC)       Dispostion: Disposition decision including need for hospitalization was considered, and patient disposition pending at time of sign out.    Final Clinical Impression(s) / ED  Diagnoses Final diagnoses:  Midline low back pain without sciatica, unspecified chronicity  History of discitis     This chart was dictated using voice recognition software.  Despite best efforts to proofread,  errors can occur which can change the documentation meaning.    Lonell Grandchild, MD 12/07/22 [phone]

## 2022-12-07 MED ORDER — OXYCODONE HCL 5 MG PO CAPS: 5.0000 mg | ORAL_CAPSULE | ORAL | 0 refills | Status: DC | PRN

## 2022-12-07 NOTE — Discharge Instructions (Signed)
Follow-up with your primary care physician and neurosurgeon for further treatment.  Begin taking oxycodone as prescribed as needed for pain.

## 2022-12-07 NOTE — ED Provider Notes (Signed)
  Physical Exam  BP (!) 133/116 (BP Location: Right Arm)   Pulse 88   Temp 98.4 F (36.9 C) (Oral)   Resp 18   Ht 5\' 3"  (1.6 m)   Wt 72.6 kg   SpO2 98%   BMI 28.34 kg/m   Physical Exam Vitals and nursing note reviewed.  Constitutional:      Appearance: Normal appearance.  Pulmonary:     Effort: Pulmonary effort is normal.  Skin:    General: Skin is warm and dry.  Neurological:     Mental Status: He is alert and oriented to person, place, and time.     Procedures  Procedures  ED Course / MDM   Clinical Course as of 12/07/22 0027  Mon Dec 06, 2022  2323 Signed out to Dr. Judd Lien pending MRI results.  [WS]    Clinical Course User Index [WS] Lonell Grandchild, MD   Medical Decision Making Amount and/or Complexity of Data Reviewed Labs: ordered. Radiology: ordered.  Risk Prescription drug management.   Patient is a 47 year old male with past medical history of bipolar disorder and polysubstance abuse.  Patient recently admitted for discitis and treated with antibiotics.  Patient presenting today with ongoing back pain.  Care was signed out to me awaiting results of an MRI of the lumbar spine.  This has been performed and resulted showing minor reactive edema and enhancement about the T10-11 interspace, but no other acute findings.  This is favored to be degenerative in nature and a suspect this to be the case given that his sed rate is 2 and CRP is normal.  Patient is requesting pain medication.  He is also has multiple questions regarding his prognosis and treatment options for which I told him he needs to follow-up with a neurosurgeon.  Patient to be discharged with a small quantity of pain medication and follow-up with his primary doctor.       Geoffery Lyons, MD 12/07/22 [phone]

## 2022-12-23 ENCOUNTER — Emergency Department (HOSPITAL_COMMUNITY)
Admission: EM | Admit: 2022-12-23 | Discharge: 2022-12-23 | Disposition: A | Discharge status: Home / Self Care | Payer: MEDICAID | Attending: Emergency Medicine | Admitting: Emergency Medicine

## 2022-12-23 ENCOUNTER — Other Ambulatory Visit: Payer: Self-pay

## 2022-12-23 ENCOUNTER — Encounter (HOSPITAL_COMMUNITY): Payer: Self-pay | Admitting: *Deleted

## 2022-12-23 DIAGNOSIS — G8929 Other chronic pain: Secondary | ICD-10-CM

## 2022-12-23 DIAGNOSIS — M545 Low back pain, unspecified: Secondary | ICD-10-CM | POA: Insufficient documentation

## 2022-12-23 NOTE — ED Triage Notes (Signed)
Pt c/o worsening chronic lower back pain that started about a month ago.

## 2022-12-23 NOTE — ED Provider Notes (Signed)
[place] EMERGENCY DEPARTMENT AT [facility] Provider Note   CSN: [id number] Arrival date & time: 12/23/22  0746     History  Chief Complaint  Patient presents with   Back Pain    Joshua Thomas is a 47 y.o. male.  He is here with a complaint of continued back pain that is been going on for a long time.  He said he has had back surgery and a back infection.  He supposed to be taking antibiotics but they may come nauseous so he does not take him.  Currently on Suboxone.  He is asking for a referral to a back doctor.  His last back surgery was back in [facility].  He was seen about 3 weeks ago for similar presentation and had an MRI that did not show any definite evidence of active infection.  He denies any fevers numbness or weakness.  The history is provided by the patient.  Back Pain Location:  Lumbar spine Quality:  Aching Pain severity:  Severe Pain is:  Same all the time Timing:  Constant Progression:  Unchanged Chronicity:  Chronic Worsened by:  Ambulation and bending Ineffective treatments:  Narcotics Associated symptoms: no abdominal pain, no bladder incontinence, no bowel incontinence, no chest pain, no dysuria, no fever, no leg pain and no weakness        Home Medications Prior to Admission medications   Medication Sig Start Date End Date Taking? Authorizing Provider  Buprenorphine HCl-Naloxone HCl (SUBOXONE) 8-2 MG FILM Place 1 Film under the tongue 2 (two) times daily.    [provider]  Calcium Carbonate Antacid (TUMS PO) Take 2-3 tablets by mouth daily as needed (gerd).    [provider]  ciprofloxacin (CIPRO) 750 MG tablet Take 1 tablet (750 mg total) by mouth 2 (two) times daily. 10/15/22   Blanchard Kelch, NP  cyclobenzaprine (FLEXERIL) 10 MG tablet Take 10 mg by mouth 2 (two) times daily as needed for muscle spasms.    [provider]  divalproex (DEPAKOTE) 250 MG DR tablet Take 250 mg by mouth at bedtime. 03/09/22    [provider]  hydrOXYzine (VISTARIL) 50 MG capsule Take 50 mg by mouth 2 (two) times daily as needed for anxiety.    [provider]  Melatonin 10 MG TABS Take 10 mg by mouth at bedtime.    [provider]  naloxone [facility]) nasal spray 4 mg/0.1 mL Take in case of overdose 08/07/20   Long, Arlyss Repress, MD  oxycodone (OXY-IR) 5 MG capsule Take 1 capsule (5 mg total) by mouth every 4 (four) hours as needed. 12/07/22   Geoffery Lyons, MD  prazosin (MINIPRESS) 1 MG capsule Take 1 mg by mouth at bedtime. 07/31/21   [provider]  QUEtiapine (SEROQUEL) 100 MG tablet Take 1 tablet (100 mg total) by mouth at bedtime. 10/16/22   Burnadette Pop, MD  sertraline (ZOLOFT) 50 MG tablet Take 100 mg by mouth daily. Patient not taking: Reported on 10/13/2022    [provider]  sulfamethoxazole-trimethoprim (BACTRIM DS) 800-160 MG tablet Take 2 tablets by mouth 2 (two) times daily. 10/15/22   Blanchard Kelch, NP      Allergies    Tylenol [acetaminophen] and Morphine    Review of Systems   Review of Systems  Constitutional:  Negative for fever.  Respiratory:  Negative for shortness of breath.   Cardiovascular:  Negative for chest pain.  Gastrointestinal:  Negative for abdominal pain and  bowel incontinence.  Genitourinary:  Negative for bladder incontinence and dysuria.  Musculoskeletal:  Positive for back pain.  Neurological:  Negative for weakness.    Physical Exam Updated Vital Signs BP 110/81 (BP Location: Left Arm)   Pulse 96   Temp 97.7 F (36.5 C) (Oral)   Resp 18   Ht 5\' 3"  (1.6 m)   Wt 73.9 kg   SpO2 98%   BMI 28.87 kg/m  Physical Exam Vitals and nursing note reviewed.  Constitutional:      General: He is not in acute distress.    Appearance: Normal appearance. He is well-developed.  HENT:     Head: Normocephalic and atraumatic.  Eyes:     Conjunctiva/sclera: Conjunctivae normal.  Cardiovascular:     Rate and Rhythm: Normal rate and  regular rhythm.     Heart sounds: No murmur heard. Pulmonary:     Effort: Pulmonary effort is normal. No respiratory distress.     Breath sounds: Normal breath sounds.  Abdominal:     Palpations: Abdomen is soft.     Tenderness: There is no abdominal tenderness. There is no guarding or rebound.  Musculoskeletal:        General: No tenderness or deformity.     Cervical back: Neck supple.  Skin:    General: Skin is warm and dry.     Capillary Refill: Capillary refill takes less than 2 seconds.  Neurological:     General: No focal deficit present.     Mental Status: He is alert.     Motor: No weakness.     Gait: Gait normal.     ED Results / Procedures / Treatments   Labs (all labs ordered are listed, but only abnormal results are displayed) Labs Reviewed - No data to display  EKG None  Radiology No results found.  Procedures Procedures    Medications Ordered in ED Medications - No data to display  ED Course/ Medical Decision Making/ A&P                                 Medical Decision Making  This patient complains of worsening of chronic back pain; this involves an extensive number of treatment Options and is a complaint that carries with it a high risk of complications and morbidity. The differential includes chronic back pain, osteomyelitis, discitis, radiculopathy Previous records obtained and reviewed patient has been seen by multiple sites for this chronic complaint and is had MRIs within the month Social determinants considered, patient with food insecurity transportation insecurity and tobacco use Critical Interventions: None  After the interventions stated above, I reevaluated the patient and found patient to be neuro intact in no distress Admission and further testing considered, no indications for admission or further workup at this time.  He is given contact information for neurosurgery for second opinion.  Recommended he continue his Suboxone and  antibiotics.  Return instructions discussed         Final Clinical Impression(s) / ED Diagnoses Final diagnoses:  Chronic midline low back pain without sciatica    Rx / DC Orders ED Discharge Orders     None         Terrilee Files, MD 12/23/22 2038

## 2022-12-23 NOTE — Discharge Instructions (Signed)
You were seen in the emergency department looking for a second opinion of your chronic back pain.  It will be important for you to continue taking your antibiotics if possible.  Continue your Suboxone.  Contact  neurosurgery for evaluation.

## 2023-01-13 ENCOUNTER — Emergency Department (HOSPITAL_COMMUNITY)
Admission: EM | Admit: 2023-01-13 | Discharge: 2023-01-14 | Disposition: A | Discharge status: Home / Self Care | Payer: MEDICAID | Attending: Emergency Medicine | Admitting: Emergency Medicine

## 2023-01-13 DIAGNOSIS — M25552 Pain in left hip: Secondary | ICD-10-CM | POA: Diagnosis present

## 2023-01-13 DIAGNOSIS — R7982 Elevated C-reactive protein (CRP): Secondary | ICD-10-CM | POA: Diagnosis not present

## 2023-01-13 DIAGNOSIS — R7401 Elevation of levels of liver transaminase levels: Secondary | ICD-10-CM | POA: Insufficient documentation

## 2023-01-13 DIAGNOSIS — J449 Chronic obstructive pulmonary disease, unspecified: Secondary | ICD-10-CM | POA: Diagnosis not present

## 2023-01-13 DIAGNOSIS — I10 Essential (primary) hypertension: Secondary | ICD-10-CM | POA: Diagnosis not present

## 2023-01-13 DIAGNOSIS — R700 Elevated erythrocyte sedimentation rate: Secondary | ICD-10-CM | POA: Diagnosis not present

## 2023-01-13 LAB — COMPREHENSIVE METABOLIC PANEL
ALT: 45 U/L — ABNORMAL HIGH (ref 0–44)
AST: 42 U/L — ABNORMAL HIGH (ref 15–41)
Albumin: 3.5 g/dL (ref 3.5–5.0)
Alkaline Phosphatase: 53 U/L (ref 38–126)
Anion gap: 9 (ref 5–15)
BUN: 13 mg/dL (ref 6–20)
CO2: 28 mmol/L (ref 22–32)
Calcium: 9.3 mg/dL (ref 8.9–10.3)
Chloride: 99 mmol/L (ref 98–111)
Creatinine, Ser: 0.88 mg/dL (ref 0.61–1.24)
GFR, Estimated: >60 mL/min (ref >60)
Glucose, Bld: 125 mg/dL — ABNORMAL HIGH (ref 70–99)
Potassium: 4.1 mmol/L (ref 3.5–5.1)
Sodium: 136 mmol/L (ref 135–145)
Total Bilirubin: 0.9 mg/dL (ref 0.0–1.2)
Total Protein: 7.9 g/dL (ref 6.5–8.1)

## 2023-01-13 LAB — CBC WITH DIFFERENTIAL/PLATELET
Abs Immature Granulocytes: 0.01 10*3/uL (ref 0.00–0.07)
Basophils Absolute: 0 10*3/uL (ref 0.0–0.1)
Basophils Relative: 0 %
Eosinophils Absolute: 0.2 10*3/uL (ref 0.0–0.5)
Eosinophils Relative: 3 %
HCT: 42.4 % (ref 39.0–52.0)
Hemoglobin: 13.7 g/dL (ref 13.0–17.0)
Immature Granulocytes: 0 %
Lymphocytes Relative: 31 %
Lymphs Abs: 1.7 10*3/uL (ref 0.7–4.0)
MCH: 30.8 pg (ref 26.0–34.0)
MCHC: 32.3 g/dL (ref 30.0–36.0)
MCV: 95.3 fL (ref 80.0–100.0)
Monocytes Absolute: 0.5 10*3/uL (ref 0.1–1.0)
Monocytes Relative: 8 %
Neutro Abs: 3.1 10*3/uL (ref 1.7–7.7)
Neutrophils Relative %: 58 %
Platelets: 243 10*3/uL (ref 150–400)
RBC: 4.45 MIL/uL (ref 4.22–5.81)
RDW: 13.2 % (ref 11.5–15.5)
WBC: 5.4 10*3/uL (ref 4.0–10.5)
nRBC: 0 % (ref 0.0–0.2)

## 2023-01-13 LAB — SEDIMENTATION RATE: Sed Rate: 39 mm/h — ABNORMAL HIGH (ref 0–16)

## 2023-01-13 LAB — C-REACTIVE PROTEIN: CRP: 2.8 mg/dL — ABNORMAL HIGH

## 2023-01-13 MED ORDER — OXYCODONE HCL 5 MG PO TABS
5.0000 mg | ORAL_TABLET | Freq: Once | ORAL | Status: AC
  Administered 2023-01-13: 5 mg via ORAL
  Filled 2023-01-13: qty 1

## 2023-01-13 NOTE — ED Triage Notes (Signed)
 Pt presents with chronic lower back pain. Admitted late 2024 for discitis and osteomyelitis. States pain is also in his L hip. Denies new injury. Pt denies saddle anesthesia and bowel/bladder incontinence. Pt denies recent fevers.

## 2023-01-13 NOTE — ED Provider Triage Note (Signed)
 Emergency Medicine Provider Triage Evaluation Note  JSON Joshua Thomas , a 48 y.o. male  was evaluated in triage.  Pt complains of atraumatic left buttock pain that radiates down the left leg x 1 week.  No fevers or chills.  No urinary incontinence or saddle anesthesia.  Review of Systems  Positive:  Negative:   Physical Exam  BP (!) 144/105 (BP Location: Left Arm)   Pulse (!) 112   Temp 99 F (37.2 C) (Oral)   Resp 16   Ht 5' 3 (1.6 m)   Wt 74.8 kg   SpO2 100%   BMI 29.23 kg/m  Gen:   Awake, no distress   Resp:  Normal effort  MSK:   Moves extremities without difficulty, exquisitely tender left buttock, minimal lumbar midline tenderness Other:  No gross neuro deficits  Medical Decision Making  Medically screening exam initiated at 4:45 PM.  Appropriate orders placed.  Jama LELON Meyers was informed that the remainder of the evaluation will be completed by another provider, this initial triage assessment does not replace that evaluation, and the importance of remaining in the ED until their evaluation is complete.     [name], PA-C 01/13/23 1657

## 2023-01-14 ENCOUNTER — Encounter (HOSPITAL_COMMUNITY): Payer: Self-pay

## 2023-01-14 ENCOUNTER — Other Ambulatory Visit: Payer: Self-pay

## 2023-01-14 ENCOUNTER — Emergency Department (HOSPITAL_COMMUNITY): Payer: MEDICAID

## 2023-01-14 MED ORDER — OXYCODONE HCL 5 MG PO TABS
5.0000 mg | ORAL_TABLET | Freq: Once | ORAL | Status: AC
  Administered 2023-01-14: 5 mg via ORAL
  Filled 2023-01-14: qty 1

## 2023-01-14 MED ORDER — CYCLOBENZAPRINE HCL 10 MG PO TABS: 10.0000 mg | ORAL_TABLET | Freq: Two times a day (BID) | ORAL | 0 refills | Status: AC | PRN

## 2023-01-14 NOTE — ED Provider Notes (Signed)
 [place] [facility]ENC[facility]OS[facility]ITAL Provider Note   CSN: [id number] Arrival date & time: 01/13/23  1544     History  Chief Complaint  Patient presents with   Back Pain    Joshua Thomas is a 48 y.o. male.  With a history of anxiety, depression, bipolar 1 disorder, hepatitis C, ADHD, hypertension, COPD, lumbar discitis presenting to the ED for evaluation of left hip pain. This began suddenly 10 days ago.  Denies any trauma.  It is localized to the greater trochanteric region.  Pain is minimal at rest, worse with any pressure or movement.  He denies any knee pain or back pain.  Reports a history of discitis but states that this does not feel similar to that.  He denies any fevers or chills.  No nausea or vomiting.  No abdominal pain.  No urinary or fecal incontinence.  No numbness, weakness or tingling.   Back Pain      Home Medications Prior to Admission medications   Medication Sig Start Date End Date Taking? Authorizing Provider  Buprenorphine  HCl-Naloxone  HCl (SUBOXONE ) 8-2 MG FILM Place 1 Film under the tongue 2 (two) times daily.    [provider]  Calcium  Carbonate Antacid (TUMS PO) Take 2-3 tablets by mouth daily as needed (gerd).    [provider]  ciprofloxacin  (CIPRO ) 750 MG tablet Take 1 tablet (750 mg total) by mouth 2 (two) times daily. 10/15/22   Melvenia Corean SAILOR, NP  cyclobenzaprine  (FLEXERIL ) 10 MG tablet Take 1 tablet (10 mg total) by mouth 2 (two) times daily as needed for up to 7 days for muscle spasms. 01/14/23 01/21/23  Archibald Marchetta, Marsa HERO, PA-C  divalproex  (DEPAKOTE ) 250 MG DR tablet Take 250 mg by mouth at bedtime. 03/09/22   [provider]  hydrOXYzine  (VISTARIL ) 50 MG capsule Take 50 mg by mouth 2 (two) times daily as needed for anxiety.    [provider]  Melatonin 10 MG TABS Take 10 mg by mouth at bedtime.    [provider]  naloxone  (NARCAN ) nasal spray 4 mg/0.1 mL Take in case of overdose  08/07/20   Long, Joshua G, MD  oxycodone  (OXY-IR) 5 MG capsule Take 1 capsule (5 mg total) by mouth every 4 (four) hours as needed. 12/07/22   Geroldine Berg, MD  prazosin  (MINIPRESS ) 1 MG capsule Take 1 mg by mouth at bedtime. 07/31/21   [provider]  QUEtiapine  (SEROQUEL ) 100 MG tablet Take 1 tablet (100 mg total) by mouth at bedtime. 10/16/22   Jillian Buttery, MD  sertraline  (ZOLOFT ) 50 MG tablet Take 100 mg by mouth daily. Patient not taking: Reported on 10/13/2022    [provider]  sulfamethoxazole -trimethoprim  (BACTRIM  DS) 800-160 MG tablet Take 2 tablets by mouth 2 (two) times daily. 10/15/22   Melvenia Corean SAILOR, NP      Allergies    Tylenol  [acetaminophen ] and Morphine     Review of Systems   Review of Systems  Musculoskeletal:  Positive for arthralgias.  All other systems reviewed and are negative.   Physical Exam Updated Vital Signs BP (!) 124/93   Pulse 72   Temp 98 F (36.7 C) (Oral)   Resp 18   Ht 5' 3 (1.6 m)   Wt 74.8 kg   SpO2 97%   BMI 29.23 kg/m  Physical Exam Vitals and nursing note reviewed.  Constitutional:      General: He is not in acute distress.    Appearance: Normal  appearance. He is normal weight. He is not ill-appearing.  HENT:     Head: Normocephalic and atraumatic.  Pulmonary:     Effort: Pulmonary effort is normal. No respiratory distress.  Abdominal:     General: Abdomen is flat.  Musculoskeletal:        General: Normal range of motion.     Cervical back: Neck supple.     Comments: TTP to the left greater trochanter without obvious deformity.  No overlying skin changes.  Full AROM in flexion and extension of the left hip and knee.  No T or L-spine TTP, step-offs, deformities, skin changes.  PT pulses 2+ bilaterally.  Skin:    General: Skin is warm and dry.  Neurological:     Mental Status: He is alert and oriented to person, place, and time.  Psychiatric:        Mood and Affect: Mood normal.        Behavior:  Behavior normal.     ED Results / Procedures / Treatments   Labs (all labs ordered are listed, but only abnormal results are displayed) Labs Reviewed  COMPREHENSIVE METABOLIC PANEL - Abnormal; Notable for the following components:      Result Value   Glucose, Bld 125 (*)    AST 42 (*)    ALT 45 (*)    All other components within normal limits  SEDIMENTATION RATE - Abnormal; Notable for the following components:   Sed Rate 39 (*)    All other components within normal limits  C-REACTIVE PROTEIN - Abnormal; Notable for the following components:   CRP 2.8 (*)    All other components within normal limits  CBC WITH DIFFERENTIAL/PLATELET    EKG None  Radiology DG Hip Unilat With Pelvis 2-3 Views Left Result Date: 01/14/2023 CLINICAL DATA:  atraumatic left hip pain x 10 days EXAM: DG HIP (WITH OR WITHOUT PELVIS) 2-3V LEFT COMPARISON:  X-ray pelvis 10/22/2022 FINDINGS: There is no evidence of hip fracture or dislocation of the left hip. Frontal view of right hip unremarkable. No acute displaced fracture or diastasis of the bones of the pelvis. There is no evidence of arthropathy or other focal bone abnormality. IMPRESSION: Negative for acute traumatic injury. Electronically Signed   By: Morgane  Naveau M.D.   On: 01/14/2023 03:25    Procedures Procedures    Medications Ordered in ED Medications  oxyCODONE  (Oxy IR/ROXICODONE ) immediate release tablet 5 mg (5 mg Oral Given 01/13/23 2138)  oxyCODONE  (Oxy IR/ROXICODONE ) immediate release tablet 5 mg (5 mg Oral Given 01/14/23 0236)    ED Course/ Medical Decision Making/ A&P                                 Medical Decision Making Amount and/or Complexity of Data Reviewed Radiology: ordered.  Risk Prescription drug management.  This patient presents to the ED for concern of left hip pain, this involves an extensive number of treatment options, and is a complaint that carries with it a high risk of complications and morbidity.  The  differential diagnosis includes fracture, strain, sprain, OA, RA, septic arthritis, trochanteric bursitis  My initial workup includes labs, imaging, pain control  Additional history obtained from: Nursing notes from this visit.  I ordered, reviewed and interpreted labs which include: CBC, CMP, ESR, CRP.  LFTs minimally elevated.  CRP elevated 2.8, ESR elevated to 39  I ordered imaging studies including x-ray left hip I  independently visualized and interpreted imaging which showed negative I agree with the radiologist interpretation  Afebrile, hemodynamically stable.  48 year old male presents to the ED for evaluation of left hip pain.  Symptoms began approximately 10 days ago.  Reports a similar history approximately 1 month ago.  Believes his shoes are causing his issues.  Symptoms resolved spontaneously the last time.  Pain is present only with direct pressure and ambulation.  Pain is not present at rest.  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  States he has not been taking his antibiotics because they make him nauseous.  He is specifically denies low back pain or signs or symptoms of systemic infection.  He also specifically states that this pain is not similar to when he was diagnosed with discitis in the past.  Patient has moderate tenderness to palpation of the left hip overlying the greater trochanter.  No overlying skin changes.  No warmth.  He is ambulatory with full AROM of the left hip.  He denies any other joint pains.  Infectious process of the left hip or back was carefully considered, however history and physical exam correlate most closely with trochanteric bursitis versus gluteal tendinopathy.  Trochanteric bursitis would also explain elevation of ESR and CRP.  No leukocytosis or fever.  Patient is on Suboxone  and cannot have prescriptions for opioids.  Will send prescription for Flexeril  and educated on potential side effects.  He was strongly encouraged  to follow-up with his neurosurgeon as soon as possible regarding his chronic discitis.  He was also encouraged to follow-up with orthopedics regarding his hip pain.  He was given contact information.  He was given very strict return precautions and voices understanding.  Stable at discharge.  At this time there does not appear to be any evidence of an acute emergency medical condition and the patient appears stable for discharge with appropriate outpatient follow up. Diagnosis was discussed with patient who verbalizes understanding of care plan and is agreeable to discharge. I have discussed return precautions with patient who verbalizes understanding. Patient encouraged to follow-up with their PCP within 1 week. All questions answered.  Patient's case discussed with Dr. Griselda who agrees with plan to discharge with follow-up.   Note: Portions of this report may have been transcribed using voice recognition software. Every effort was made to ensure accuracy; however, inadvertent computerized transcription errors may still be present.        Final Clinical Impression(s) / ED Diagnoses Final diagnoses:  Left hip pain    Rx / DC Orders ED Discharge Orders          Ordered    cyclobenzaprine  (FLEXERIL ) 10 MG tablet  2 times daily PRN        01/14/23 0411              Edwardo Marsa HERO, PA-C 01/14/23 9587    Griselda Norris, MD 01/14/23 [phone]

## 2023-01-14 NOTE — Discharge Instructions (Signed)
 You have been seen today for your complaint of left hip pain. Your lab work was overall reassuring. Your imaging was reassuring. Your discharge medications include flexeril . This is a muscle relaxer. It may cause drowsiness. Do not drive, operate heavy machinery or make important decisions when taking this medication. Only take it at night until you know how it affects you. Only take it as needed and take other medications such as ibuprofen  or tylenol  prior to trying this medication. Follow up with: Dr. Georgina, he is an orthopedic surgeon.  Call to schedule follow-up appointment. Your neurosurgeon.  Call to schedule follow-up appointment as soon as possible Please seek immediate medical care if you develop any of the following symptoms: You cannot move your hip. You have very bad pain. You cannot control the muscles in your feet. At this time there does not appear to be the presence of an emergent medical condition, however there is always the potential for conditions to change. Please read and follow the below instructions.  Do not take your medicine if  develop an itchy rash, swelling in your mouth or lips, or difficulty breathing; call 911 and seek immediate emergency medical attention if this occurs.  You may review your lab tests and imaging results in their entirety on your MyChart account.  Please discuss all results of fully with your primary care provider and other specialist at your follow-up visit.  Note: Portions of this text may have been transcribed using voice recognition software. Every effort was made to ensure accuracy; however, inadvertent computerized transcription errors may still be present.

## 2023-02-21 ENCOUNTER — Other Ambulatory Visit: Payer: Self-pay

## 2023-02-21 ENCOUNTER — Emergency Department
Admission: EM | Admit: 2023-02-21 | Discharge: 2023-02-21 | Disposition: A | Discharge status: Home / Self Care | Payer: MEDICAID | Attending: Emergency Medicine | Admitting: Emergency Medicine

## 2023-02-21 DIAGNOSIS — K029 Dental caries, unspecified: Secondary | ICD-10-CM | POA: Insufficient documentation

## 2023-02-21 DIAGNOSIS — K0889 Other specified disorders of teeth and supporting structures: Secondary | ICD-10-CM | POA: Diagnosis present

## 2023-02-21 MED ORDER — OXYCODONE-ACETAMINOPHEN 5-325 MG PO TABS
1.0000 | ORAL_TABLET | Freq: Once | ORAL | Status: AC
  Administered 2023-02-21: 1 via ORAL
  Filled 2023-02-21: qty 1

## 2023-02-21 MED ORDER — AMOXICILLIN-POT CLAVULANATE 875-125 MG PO TABS: 1.0000 | ORAL_TABLET | Freq: Two times a day (BID) | ORAL | 0 refills | Status: AC

## 2023-02-21 MED ORDER — OXYCODONE-ACETAMINOPHEN 5-325 MG PO TABS: 1.0000 | ORAL_TABLET | ORAL | 0 refills | Status: DC | PRN

## 2023-02-21 MED ORDER — LIDOCAINE-EPINEPHRINE 2 %-1:100000 IJ SOLN
1.7000 mL | Freq: Once | INTRAMUSCULAR | Status: AC
  Administered 2023-02-21: 1.7 mL via INTRADERMAL
  Filled 2023-02-21: qty 1.7

## 2023-02-21 NOTE — ED Provider Triage Note (Signed)
 Emergency Medicine Provider Triage Evaluation Note  Joshua Thomas , a 48 y.o. male  was evaluated in triage.  Pt complains of right upper dental for 3 days.   Review of Systems  Positive: Dental pain, fever Negative:   Physical Exam  BP (!) 117/92 (BP Location: Left Arm)   Pulse 83   Temp 98 F (36.7 C) (Oral)   Resp 18   Ht 5\' 3"  (1.6 m)   Wt 72.6 kg   SpO2 100%   BMI 28.34 kg/m  Gen:   Awake, no distress   Resp:  Normal effort  MSK:   Moves extremities without difficulty  Other:  Poor dentition throughout  Medical Decision Making  Medically screening exam initiated at 5:45 PM.  Appropriate orders placed.  Gladis Lame was informed that the remainder of the evaluation will be completed by another provider, this initial triage assessment does not replace that evaluation, and the importance of remaining in the ED until their evaluation is complete.     Phyliss Breen, PA-C 02/21/23 1746

## 2023-02-21 NOTE — ED Triage Notes (Signed)
 Pt sts that he has been having right upper dental pain. Pt sts that the extreme pain has been going on for the last three days.

## 2023-02-21 NOTE — ED Provider Notes (Signed)
 [facility] Provider Note   Event Date/Time   First MD Initiated Contact with Patient 02/21/23 1848     (approximate) History  Dental Pain  HPI KRAG ADAMOWICZ is a 48 y.o. male with a past medical history of dental caries who presents complaining of of right upper mandibular molar pain that is worsening over the last 3 days.  Patient denies any fevers however does endorse right facial swelling.  Patient also endorses difficulty eating. ROS: Patient currently denies any vision changes, tinnitus, difficulty speaking, facial droop, sore throat, chest pain, shortness of breath, abdominal pain, nausea/vomiting/diarrhea, dysuria, or weakness/numbness/paresthesias in any extremity   Physical Exam  Triage Vital Signs: ED Triage Vitals [02/21/23 1743]  Encounter Vitals Group     BP (!) 117/92     Systolic BP Percentile      Diastolic BP Percentile      Pulse Rate 83     Resp 18     Temp 98 F (36.7 C)     Temp Source Oral     SpO2 100 %     Weight 160 lb (72.6 kg)     Height 5\' 3"  (1.6 m)     Head Circumference      Peak Flow      Pain Score 10     Pain Loc      Pain Education      Exclude from Growth Chart    Most recent vital signs: Vitals:   02/21/23 1743  BP: (!) 117/92  Pulse: 83  Resp: 18  Temp: 98 F (36.7 C)  SpO2: 100%   General: Awake, oriented x4. CV:  Good peripheral perfusion.  Resp:  Normal effort.  Abd:  No distention.  Other:  Middle-aged overweight Caucasian male sitting on chair in room in moderate distress secondary to pain.  Right upper or mandibular molars with multiple dental caries and surrounding erythema, edema ED Results / Procedures / Treatments  Labs (all labs ordered are listed, but only abnormal results are displayed) Labs Reviewed - No data to display PROCEDURES: Critical Care performed: No Dental Block  Date/Time: 02/21/2023 7:59 PM  Performed by: Charleen Conn, MD Authorized by: Charleen Conn, MD    Consent:    Consent obtained:  Verbal   Consent given by:  Patient   Risks, benefits, and alternatives were discussed: yes   Universal protocol:    Immediately prior to procedure, a time out was called: yes     Patient identity confirmed:  Verbally with patient Indications:    Indications: dental pain   Location:    Block type:  Supraperiosteal   Supraperiosteal location:  Upper teeth   Upper teeth location:  3/RU 1st molar, 2/RU 2nd molar and 4/RU 2nd bicuspid Procedure details:    Syringe type:  Aspirating dental syringe   Needle gauge:  27 G   Anesthetic injected:  Lidocaine  2% WITH epi   Injection procedure:  Anatomic landmarks identified, introduced needle, incremental injection and negative aspiration for blood Post-procedure details:    Outcome:  Pain improved   Procedure completion:  Tolerated well, no immediate complications  MEDICATIONS ORDERED IN ED: Medications  lidocaine -EPINEPHrine  (XYLOCAINE  W/EPI) 2 %-1:100000 (with pres) injection 1.7 mL (has no administration in time range)  oxyCODONE -acetaminophen  (PERCOCET/ROXICET) 5-325 MG per tablet 1 tablet (1 tablet Oral Given 02/21/23 1914)   IMPRESSION / MDM / ASSESSMENT AND PLAN / ED COURSE  I reviewed the triage vital signs and the  nursing notes.                             The patient is on the cardiac monitor to evaluate for evidence of arrhythmia and/or significant heart rate changes. Patient's presentation is most consistent with acute presentation with potential threat to life or bodily function. Patient not immunosuppressed. No e/o tooth fracture, avulsion, or bleeding socket. No e/o RPA, PTA, Ludwig's angina, periapical abscess. No e/o gingival hyperplasia or concern for drug reaction.  Rx Ibuprofen .  Given significant dental caries with surrounding erythema, will cover empirically with Augmentin  Disposition: Discharge home. Discussed return precautions for odontogenic infections and other dental pain  emergencies. Will provide dental clinic list.   FINAL CLINICAL IMPRESSION(S) / ED DIAGNOSES   Final diagnoses:  Infected dental caries   Rx / DC Orders   ED Discharge Orders          Ordered    oxyCODONE -acetaminophen  (PERCOCET) 5-325 MG tablet  Every 4 hours PRN        02/21/23 1916           Note:  This document was prepared using Dragon voice recognition software and may include unintentional dictation errors.   Mikena Masoner K, MD 02/21/23 2000

## 2023-10-15 ENCOUNTER — Inpatient Hospital Stay (HOSPITAL_COMMUNITY)
Admission: AD | Admit: 2023-10-15 | Discharge: 2023-10-25 | DRG: 885 | Disposition: A | Discharge status: Home / Self Care | Payer: MEDICAID | Source: Intra-hospital

## 2023-10-15 ENCOUNTER — Encounter (HOSPITAL_COMMUNITY): Payer: Self-pay | Admitting: Psychiatry

## 2023-10-15 ENCOUNTER — Emergency Department (HOSPITAL_COMMUNITY)
Admission: EM | Admit: 2023-10-15 | Discharge: 2023-10-15 | Disposition: A | Discharge status: Home / Self Care | Payer: MEDICAID | Attending: Emergency Medicine | Admitting: Emergency Medicine

## 2023-10-15 ENCOUNTER — Other Ambulatory Visit: Payer: Self-pay

## 2023-10-15 ENCOUNTER — Encounter (HOSPITAL_COMMUNITY): Payer: Self-pay | Admitting: Emergency Medicine

## 2023-10-15 DIAGNOSIS — F17210 Nicotine dependence, cigarettes, uncomplicated: Secondary | ICD-10-CM | POA: Diagnosis present

## 2023-10-15 DIAGNOSIS — Z560 Unemployment, unspecified: Secondary | ICD-10-CM

## 2023-10-15 DIAGNOSIS — K0889 Other specified disorders of teeth and supporting structures: Secondary | ICD-10-CM | POA: Diagnosis present

## 2023-10-15 DIAGNOSIS — Z811 Family history of alcohol abuse and dependence: Secondary | ICD-10-CM

## 2023-10-15 DIAGNOSIS — Z604 Social exclusion and rejection: Secondary | ICD-10-CM | POA: Diagnosis present

## 2023-10-15 DIAGNOSIS — Z79899 Other long term (current) drug therapy: Secondary | ICD-10-CM | POA: Diagnosis not present

## 2023-10-15 DIAGNOSIS — F1190 Opioid use, unspecified, uncomplicated: Secondary | ICD-10-CM

## 2023-10-15 DIAGNOSIS — F332 Major depressive disorder, recurrent severe without psychotic features: Secondary | ICD-10-CM | POA: Diagnosis present

## 2023-10-15 DIAGNOSIS — F1120 Opioid dependence, uncomplicated: Principal | ICD-10-CM | POA: Diagnosis present

## 2023-10-15 DIAGNOSIS — F4310 Post-traumatic stress disorder, unspecified: Secondary | ICD-10-CM | POA: Diagnosis present

## 2023-10-15 DIAGNOSIS — F17290 Nicotine dependence, other tobacco product, uncomplicated: Secondary | ICD-10-CM | POA: Diagnosis present

## 2023-10-15 DIAGNOSIS — E119 Type 2 diabetes mellitus without complications: Secondary | ICD-10-CM | POA: Diagnosis present

## 2023-10-15 DIAGNOSIS — G4700 Insomnia, unspecified: Secondary | ICD-10-CM | POA: Diagnosis present

## 2023-10-15 DIAGNOSIS — I10 Essential (primary) hypertension: Secondary | ICD-10-CM | POA: Diagnosis present

## 2023-10-15 DIAGNOSIS — Z59868 Other specified financial insecurity: Secondary | ICD-10-CM

## 2023-10-15 DIAGNOSIS — J449 Chronic obstructive pulmonary disease, unspecified: Secondary | ICD-10-CM | POA: Diagnosis present

## 2023-10-15 DIAGNOSIS — F1124 Opioid dependence with opioid-induced mood disorder: Secondary | ICD-10-CM | POA: Diagnosis present

## 2023-10-15 DIAGNOSIS — K219 Gastro-esophageal reflux disease without esophagitis: Secondary | ICD-10-CM | POA: Diagnosis present

## 2023-10-15 DIAGNOSIS — Z5941 Food insecurity: Secondary | ICD-10-CM | POA: Diagnosis not present

## 2023-10-15 DIAGNOSIS — F1123 Opioid dependence with withdrawal: Secondary | ICD-10-CM | POA: Diagnosis present

## 2023-10-15 DIAGNOSIS — Z5900 Homelessness unspecified: Secondary | ICD-10-CM | POA: Diagnosis present

## 2023-10-15 DIAGNOSIS — F1994 Other psychoactive substance use, unspecified with psychoactive substance-induced mood disorder: Secondary | ICD-10-CM | POA: Diagnosis not present

## 2023-10-15 DIAGNOSIS — E1165 Type 2 diabetes mellitus with hyperglycemia: Secondary | ICD-10-CM | POA: Insufficient documentation

## 2023-10-15 DIAGNOSIS — Z5982 Transportation insecurity: Secondary | ICD-10-CM

## 2023-10-15 DIAGNOSIS — Z833 Family history of diabetes mellitus: Secondary | ICD-10-CM | POA: Diagnosis not present

## 2023-10-15 DIAGNOSIS — Z9151 Personal history of suicidal behavior: Secondary | ICD-10-CM | POA: Diagnosis not present

## 2023-10-15 DIAGNOSIS — F319 Bipolar disorder, unspecified: Secondary | ICD-10-CM | POA: Diagnosis not present

## 2023-10-15 DIAGNOSIS — R45851 Suicidal ideations: Secondary | ICD-10-CM | POA: Insufficient documentation

## 2023-10-15 DIAGNOSIS — F419 Anxiety disorder, unspecified: Secondary | ICD-10-CM | POA: Diagnosis present

## 2023-10-15 DIAGNOSIS — F1914 Other psychoactive substance abuse with psychoactive substance-induced mood disorder: Secondary | ICD-10-CM | POA: Diagnosis not present

## 2023-10-15 LAB — ETHANOL: Alcohol, Ethyl (B): <15 mg/dL (ref <15)

## 2023-10-15 LAB — URINE DRUG SCREEN
Amphetamines: NEGATIVE
Barbiturates: NEGATIVE
Benzodiazepines: NEGATIVE
Cocaine: NEGATIVE
Fentanyl: POSITIVE — AB
Methadone Scn, Ur: NEGATIVE
Opiates: NEGATIVE
Tetrahydrocannabinol: NEGATIVE

## 2023-10-15 LAB — COMPREHENSIVE METABOLIC PANEL WITH GFR
ALT: 26 U/L (ref 0–44)
AST: 22 U/L (ref 15–41)
Albumin: 3.9 g/dL (ref 3.5–5.0)
Alkaline Phosphatase: 57 U/L (ref 38–126)
Anion gap: 10 (ref 5–15)
BUN: 13 mg/dL (ref 6–20)
CO2: 24 mmol/L (ref 22–32)
Calcium: 9.5 mg/dL (ref 8.9–10.3)
Chloride: 99 mmol/L (ref 98–111)
Creatinine, Ser: 0.76 mg/dL (ref 0.61–1.24)
GFR, Estimated: >60 mL/min (ref >60)
Glucose, Bld: 286 mg/dL — ABNORMAL HIGH (ref 70–99)
Potassium: 4.1 mmol/L (ref 3.5–5.1)
Sodium: 134 mmol/L — ABNORMAL LOW (ref 135–145)
Total Bilirubin: 0.3 mg/dL (ref 0.0–1.2)
Total Protein: 7.3 g/dL (ref 6.5–8.1)

## 2023-10-15 LAB — CBC
HCT: 40.7 % (ref 39.0–52.0)
Hemoglobin: 12.8 g/dL — ABNORMAL LOW (ref 13.0–17.0)
MCH: 29.4 pg (ref 26.0–34.0)
MCHC: 31.4 g/dL (ref 30.0–36.0)
MCV: 93.3 fL (ref 80.0–100.0)
Platelets: 209 K/uL (ref 150–400)
RBC: 4.36 MIL/uL (ref 4.22–5.81)
RDW: 13.2 % (ref 11.5–15.5)
WBC: 4.7 K/uL (ref 4.0–10.5)
nRBC: 0 % (ref 0.0–0.2)

## 2023-10-15 MED ORDER — METHOCARBAMOL 500 MG PO TABS
500.0000 mg | ORAL_TABLET | Freq: Three times a day (TID) | ORAL | Status: AC | PRN
  Administered 2023-10-15 – 2023-10-17 (×4): 500 mg via ORAL
  Filled 2023-10-15 (×4): qty 1

## 2023-10-15 MED ORDER — METFORMIN HCL 500 MG PO TABS
500.0000 mg | ORAL_TABLET | Freq: Two times a day (BID) | ORAL | Status: DC
  Administered 2023-10-15: 500 mg via ORAL
  Filled 2023-10-15: qty 1

## 2023-10-15 MED ORDER — ONDANSETRON 4 MG PO TBDP
4.0000 mg | ORAL_TABLET | Freq: Four times a day (QID) | ORAL | Status: AC | PRN
  Administered 2023-10-15 – 2023-10-16 (×2): 4 mg via ORAL
  Filled 2023-10-15 (×2): qty 1

## 2023-10-15 MED ORDER — NAPROXEN 500 MG PO TABS
500.0000 mg | ORAL_TABLET | Freq: Two times a day (BID) | ORAL | Status: AC | PRN
  Administered 2023-10-15 – 2023-10-18 (×3): 500 mg via ORAL
  Filled 2023-10-15 (×3): qty 1

## 2023-10-15 MED ORDER — LORAZEPAM 2 MG/ML IJ SOLN
2.0000 mg | Freq: Three times a day (TID) | INTRAMUSCULAR | Status: DC | PRN
  Filled 2023-10-15: qty 1

## 2023-10-15 MED ORDER — METFORMIN HCL 500 MG PO TABS
500.0000 mg | ORAL_TABLET | Freq: Two times a day (BID) | ORAL | Status: DC
  Administered 2023-10-16 – 2023-10-25 (×19): 500 mg via ORAL
  Filled 2023-10-15 (×19): qty 1

## 2023-10-15 MED ORDER — HYDROXYZINE HCL 50 MG PO TABS
50.0000 mg | ORAL_TABLET | Freq: Two times a day (BID) | ORAL | Status: DC | PRN
  Administered 2023-10-15: 50 mg via ORAL
  Filled 2023-10-15 (×2): qty 1

## 2023-10-15 MED ORDER — LOPERAMIDE HCL 2 MG PO CAPS: 2.0000 mg | ORAL_CAPSULE | ORAL | Status: AC | PRN

## 2023-10-15 MED ORDER — MELATONIN 5 MG PO TABS: 10.0000 mg | ORAL_TABLET | Freq: Every day | ORAL | Status: DC

## 2023-10-15 MED ORDER — DIPHENHYDRAMINE HCL 50 MG/ML IJ SOLN
50.0000 mg | Freq: Three times a day (TID) | INTRAMUSCULAR | Status: DC | PRN
  Administered 2023-10-15: 50 mg via INTRAMUSCULAR
  Filled 2023-10-15: qty 1

## 2023-10-15 MED ORDER — HALOPERIDOL LACTATE 5 MG/ML IJ SOLN: 10.0000 mg | Freq: Three times a day (TID) | INTRAMUSCULAR | Status: DC | PRN

## 2023-10-15 MED ORDER — HYDROXYZINE HCL 50 MG PO TABS
50.0000 mg | ORAL_TABLET | Freq: Two times a day (BID) | ORAL | Status: DC | PRN
  Administered 2023-10-15 – 2023-10-24 (×8): 50 mg via ORAL
  Filled 2023-10-15 (×8): qty 1

## 2023-10-15 MED ORDER — QUETIAPINE FUMARATE 100 MG PO TABS
100.0000 mg | ORAL_TABLET | Freq: Every day | ORAL | Status: DC
  Administered 2023-10-15 – 2023-10-24 (×10): 100 mg via ORAL
  Filled 2023-10-15 (×10): qty 1

## 2023-10-15 MED ORDER — HALOPERIDOL 5 MG PO TABS
5.0000 mg | ORAL_TABLET | Freq: Three times a day (TID) | ORAL | Status: DC | PRN
  Administered 2023-10-18 – 2023-10-21 (×2): 5 mg via ORAL
  Filled 2023-10-15 (×2): qty 1

## 2023-10-15 MED ORDER — ALUM & MAG HYDROXIDE-SIMETH 200-200-20 MG/5ML PO SUSP: 30.0000 mL | ORAL | Status: DC | PRN

## 2023-10-15 MED ORDER — DIPHENHYDRAMINE HCL 25 MG PO CAPS
50.0000 mg | ORAL_CAPSULE | Freq: Three times a day (TID) | ORAL | Status: DC | PRN
  Administered 2023-10-18 – 2023-10-21 (×2): 50 mg via ORAL
  Filled 2023-10-15 (×2): qty 2

## 2023-10-15 MED ORDER — CLONIDINE HCL 0.1 MG PO TABS
0.1000 mg | ORAL_TABLET | Freq: Four times a day (QID) | ORAL | Status: AC
  Administered 2023-10-15 – 2023-10-17 (×7): 0.1 mg via ORAL
  Filled 2023-10-15 (×7): qty 1

## 2023-10-15 MED ORDER — LORAZEPAM 2 MG/ML IJ SOLN
2.0000 mg | Freq: Three times a day (TID) | INTRAMUSCULAR | Status: DC | PRN
  Administered 2023-10-15: 2 mg via INTRAMUSCULAR

## 2023-10-15 MED ORDER — METHOCARBAMOL 500 MG PO TABS: 500.0000 mg | ORAL_TABLET | Freq: Three times a day (TID) | ORAL | Status: DC | PRN

## 2023-10-15 MED ORDER — CLONIDINE HCL 0.1 MG PO TABS: 0.1000 mg | ORAL_TABLET | Freq: Every day | ORAL | Status: DC

## 2023-10-15 MED ORDER — NICOTINE 14 MG/24HR TD PT24
14.0000 mg | MEDICATED_PATCH | Freq: Every day | TRANSDERMAL | Status: DC
Start: 2023-10-15 — End: 2023-10-23
  Administered 2023-10-16 – 2023-10-23 (×8): 14 mg via TRANSDERMAL
  Filled 2023-10-15 (×8): qty 1

## 2023-10-15 MED ORDER — DICYCLOMINE HCL 20 MG PO TABS
20.0000 mg | ORAL_TABLET | Freq: Four times a day (QID) | ORAL | Status: DC | PRN
  Administered 2023-10-15: 20 mg via ORAL
  Filled 2023-10-15: qty 1

## 2023-10-15 MED ORDER — CLONIDINE HCL 0.1 MG PO TABS
0.1000 mg | ORAL_TABLET | Freq: Four times a day (QID) | ORAL | Status: DC
  Administered 2023-10-15: 0.1 mg via ORAL
  Filled 2023-10-15: qty 1

## 2023-10-15 MED ORDER — ONDANSETRON 4 MG PO TBDP: 4.0000 mg | ORAL_TABLET | Freq: Four times a day (QID) | ORAL | Status: DC | PRN

## 2023-10-15 MED ORDER — CLONIDINE HCL 0.1 MG PO TABS
0.1000 mg | ORAL_TABLET | Freq: Two times a day (BID) | ORAL | Status: AC
  Administered 2023-10-17 – 2023-10-18 (×2): 0.1 mg via ORAL
  Filled 2023-10-15 (×4): qty 1

## 2023-10-15 MED ORDER — HALOPERIDOL LACTATE 5 MG/ML IJ SOLN
5.0000 mg | Freq: Three times a day (TID) | INTRAMUSCULAR | Status: DC | PRN
  Administered 2023-10-15: 5 mg via INTRAMUSCULAR
  Filled 2023-10-15: qty 1

## 2023-10-15 MED ORDER — LOPERAMIDE HCL 2 MG PO CAPS: 2.0000 mg | ORAL_CAPSULE | ORAL | Status: DC | PRN

## 2023-10-15 MED ORDER — DIPHENHYDRAMINE HCL 50 MG/ML IJ SOLN: 50.0000 mg | Freq: Three times a day (TID) | INTRAMUSCULAR | Status: DC | PRN

## 2023-10-15 MED ORDER — CLONIDINE HCL 0.1 MG PO TABS: 0.1000 mg | ORAL_TABLET | Freq: Two times a day (BID) | ORAL | Status: DC

## 2023-10-15 MED ORDER — NAPROXEN 500 MG PO TABS: 500.0000 mg | ORAL_TABLET | Freq: Two times a day (BID) | ORAL | Status: DC | PRN

## 2023-10-15 MED ORDER — DICYCLOMINE HCL 20 MG PO TABS: 20.0000 mg | ORAL_TABLET | Freq: Four times a day (QID) | ORAL | Status: AC | PRN

## 2023-10-15 MED ORDER — QUETIAPINE FUMARATE 100 MG PO TABS: 100.0000 mg | ORAL_TABLET | Freq: Every day | ORAL | Status: DC

## 2023-10-15 NOTE — [facility] Group Notes (Signed)
 Psychoeducational Group Note  Date:  10/15/2023 Time:  2000  Group Topic/Focus:  Wrap up group  Participation Level: Did Not Attend  Participation Quality:  Not Applicable  Affect:  Not Applicable  Cognitive:  Not Applicable  Insight:  Not Applicable  Engagement in Group: Not Applicable  Additional Comments:  Did not attend.   Lenora Shaver S 10/15/2023, 10:28 PM

## 2023-10-15 NOTE — ED Notes (Signed)
 Pt belongings sent with him to [facility].

## 2023-10-15 NOTE — ED Notes (Signed)
 Report given to Olivette at [facility]. [facility] called.

## 2023-10-15 NOTE — Progress Notes (Signed)
   10/15/23 2031  Psych Admission Type (Psych Patients Only)  Admission Status Voluntary  Psychosocial Assessment  Patient Complaints Anxiety;Substance abuse  Eye Contact Brief  Facial Expression Anxious  Affect Anxious  Speech Logical/coherent  Interaction Assertive  Motor Activity Restless  Appearance/Hygiene Disheveled;Poor hygiene  Behavior Characteristics Appropriate to situation  Mood Anxious  Thought Process  Coherency WDL  Content Blaming others  Delusions None reported or observed  Perception WDL  Hallucination None reported or observed  Judgment Impaired  Confusion None  Danger to Self  Current suicidal ideation? Denies  Danger to Others  Danger to Others None reported or observed

## 2023-10-15 NOTE — Consult Note (Signed)
 [facility] Psychiatric Consult Initial  Patient Name: .Joshua Thomas  MRN: [id number]  DOB: March 12, 1975  Consult Order details:  Orders (From admission, onward)     Start     Ordered   10/15/23 1349  CONSULT TO CALL ACT TEAM       Ordering Provider: Dasie Faden, MD  Provider:  (Not yet assigned)  Question:  Reason for Consult?  Answer:  Psych consult   10/15/23 1349   10/15/23 1349  IP CONSULT TO PSYCHIATRY       Ordering Provider: Dasie Faden, MD  Provider:  (Not yet assigned)  Question:  Reason for consult:  Answer:  Medication management   10/15/23 1349             Mode of Visit: In person    Psychiatry Consult Evaluation  Service Date: October 15, 2023 LOS:  LOS: 0 days  Chief Complaint I need to detox from fentanyl   Primary Psychiatric Diagnoses  Opioid use disorder 2.  Bipolar disorder   Assessment  Joshua Thomas is a 48 y.o. male admitted: Presented to the EDfor 10/15/2023 12:34 PM for presented to the ED reporting suicidal ideation in the context of many life stressors and daily fentanyl  use. He carries the psychiatric diagnoses of bipolar disorder, depression, ADHD, PTSD, polysubstance abuse (opiates, alcohol, methamphetamine, and cocaine) and has a past medical history of HTN, GERD, Hepatitis C, Hyperglycemia, diabetes, acute osteomyelitis of lumbar spine and sciatica.   His current presentation of fentanyl  withdrawal symptoms, suicidal ideaiton and depression and  is most consistent with opioid use disorder. He meets criteria for inpatient psychiatric hospitalization based on being a danger to himself and acute withdrawal symptoms.  Current outpatient psychotropic medications include none.  He hasn't taken them in approximately 6 weeks. Historically he was taking suboxone , seroquel  and zoloft  and has had a positive response to these medications. He was not compliant with medications prior to admission as evidenced by patient report. On initial examination,  patient is restless, anxious and cooperative. Please see plan below for detailed recommendations.   Diagnoses:  Active Hospital problems: Principal Problem:   Opioid use disorder, severe, dependence (HCC) Active Problems:   Bipolar disorder (HCC)    Plan   ## Psychiatric Medication Recommendations:  Start medications to manage withdrawal symptoms --clonidine  0.1mg  PO QID for 8 doses; then BID for 4 doses; then daily for 2 doses --bentyl  20mg  PO Q 6 hours PRN abdominal spasms --imodium  2mg  PO PRN for loose stool --robaxin  500mg  PO Q 8 hours PRN muscle spasms --zofran  4mg  PO Q 6 hours PRN nausea Restart psychiatric medications --seroquel  150mg  PO Q HS   ## Medical Decision Making Capacity: Not specifically addressed in this encounter  ## Further Work-up:  -- most recent EKG on 10/15/2023 had QtC of 484 -- Pertinent labwork reviewed earlier this admission includes: CBC, CMP, alcohol and UDS   ## Disposition:-- We recommend inpatient psychiatric hospitalization when medically cleared. Patient is under voluntary admission status at this time; please IVC if attempts to leave hospital.  ## Behavioral / Environmental: - No specific recommendations at this time.     ## Safety and Observation Level:  - Based on my clinical evaluation, I estimate the patient to be at high risk of self harm in the current setting. - At this time, we recommend  1:1 Observation. This decision is based on my review of the chart including patient's history and current presentation, interview of the patient, mental status examination, and  consideration of suicide risk including evaluating suicidal ideation, plan, intent, suicidal or self-harm behaviors, risk factors, and protective factors. This judgment is based on our ability to directly address suicide risk, implement suicide prevention strategies, and develop a safety plan while the patient is in the clinical setting. Please contact our team if there is a  concern that risk level has changed.  CSSR Risk Category:C-SSRS RISK CATEGORY: High Risk  Suicide Risk Assessment: Patient has following modifiable risk factors for suicide: active suicidal ideation, medication noncompliance, and current symptoms: anxiety/panic, insomnia, impulsivity, anhedonia, hopelessness, substance abuse which we are addressing by recommending inpatient psychiatric hospitalization. Patient has following non-modifiable or demographic risk factors for suicide: male gender and psychiatric hospitalization Patient has the following protective factors against suicide: Access to outpatient mental health care  Thank you for this consult request. Recommendations have been communicated to the primary team.  We will continue to follow at this time.   Joshua Castrellon A Kline, NP       History of Present Illness  Relevant Aspects of Hospital ED Course:  Admitted on 10/15/2023 for  presented to the ED reporting suicidal ideation in the context of many life stressors and daily fentanyl  use. He carries the psychiatric diagnoses of bipolar disorder, depression, ADHD, PTSD, polysubstance abuse (opiates, alcohol, methamphetamine, and cocaine) and has a past medical history of HTN, GERD, COPD, Hepatitis C, Hyperglycemia, diabetes, acute osteomyelitis of lumbar spine and sciatica.    Patient Report:  Joshua Thomas, is seen face to face by this provider, consulted with Dr. Larina; and chart reviewed on 10/15/23.  On evaluation Joshua Thomas reports he is feeling suicidal.  He was sober for 15 months after which he started using drugs again about 6 weeks ago.  He is using fentanyl , cocaine and methamphetamine.  Patient is requesting help with detox and getting back on his medications. He says that he can't live this way any longer and that he will kill himself without help.   Patient reports his brothers and father had substance abuse issues and that his grandmother was bipolar.  He is currently single and  has 3 adult children; 2 sons and 1 daughter.    He reports he is unemployed and does not receive disability.  He stated that he needs to reapply for disability due to an infection in his back that messed me up real bad and I can't work.  He shares that his outpatient psych provider is [facility].  He indicates he will be able to resume outpatient care with her once he is stabilized.    During evaluation Joshua Thomas is laying in bed in moderate distress.  He is alert & oriented x 4, restless, cooperative and attentive for this assessment.  His mood is depressed and anxious with congruent affect.  He has normal speech, and behavior.  Objectively there is no evidence of psychosis/mania or delusional thinking. Pt does not appear to be responding to internal or external stimuli.  Patient is able to converse coherently, goal directed thoughts, no distractibility, or pre-occupation.  He denies homicidal ideation, psychosis, and paranoia; patient endorses  suicidal/self-harm ideation.  Patient answered questions appropriately.    I personally spent a total of 60 minutes in the care of the patient today including preparing to see the patient, getting/reviewing separately obtained history, performing a medically appropriate exam/evaluation, counseling and educating, placing orders, referring and communicating with other health care professionals, documenting clinical information in the EHR, independently interpreting results, and  coordinating care.  Psych ROS:  Depression: endorses Anxiety:  endorses Mania (lifetime and current): I think so Psychosis: (lifetime and current): denies   Review of Systems  Constitutional:  Positive for chills.  Musculoskeletal:  Positive for back pain and joint pain.  Neurological:  Positive for tremors.  Psychiatric/Behavioral:  Positive for substance abuse and suicidal ideas. The patient is nervous/anxious.   All other systems reviewed and are negative.     Psychiatric and Social History  Psychiatric History:  Information collected from patient and chart review  Prev Dx/Sx: bipolar disorder, depression, ADHD, PTSD, polysubstance abuse (opiates, alcohol, methamphetamine, and cocaine) Current Psych Provider: [facility] Home Meds (current): suboxone , seroquel  and zoloft  Previous Med Trials: unknown Therapy: denies  Prior Psych Hospitalization: yes  Prior Self Harm: yes Prior Violence: denies  Family Psych History: Father - alcoholism; Grandmother - bipolar; 2 Brothers - alcoholism Family Hx suicide: Brother - committed suicide  Social History:  Developmental Hx: unknown Educational Hx: completed 8th grade Occupational Hx: unemployed 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 rate 16, SpO2 100%. There is no height or weight on file to calculate BMI.  Physical Exam Vitals and nursing note reviewed. Exam conducted with a chaperone present.  Constitutional:      Appearance: He is ill-appearing.  Eyes:     Pupils: Pupils are equal, round, and reactive to light.  Pulmonary:     Effort: Pulmonary effort is normal.  Neurological:     Mental Status: He is alert and oriented to person, place, and time.  Psychiatric:        Attention and Perception: Attention and perception normal.        Mood and Affect: Mood is anxious and depressed.        Speech: Speech normal.         Behavior: Behavior is cooperative.        Thought Content: Thought content includes suicidal ideation.        Cognition and Memory: Cognition and memory normal.     Mental Status Exam: General Appearance: Disheveled  Orientation:  Full (Time, Place, and Person)  Memory:  Immediate;   Good Recent;   Good Remote;   Good  Concentration:  Concentration: Good  Recall:  Good  Attention  Fair  Eye Contact:  Fair  Speech:  Clear and Coherent  Language:  Fair  Volume:  Normal  Mood: depressed and anxious  Affect:  Congruent  Thought Process:  Goal Directed  Thought Content:  Logical  Suicidal Thoughts:  Yes.  without intent/plan  Homicidal Thoughts:  No  Judgement:  Impaired  Insight:  Fair  Psychomotor Activity:  Increased and Restlessness  Akathisia:  No  Fund of Knowledge:  Fair      Assets:  Communication Skills Desire for Improvement Leisure Time  Cognition:  WNL  ADL's:  Intact  AIMS (if indicated):        Other History   These  have been pulled in through the EMR, reviewed, and updated if appropriate.  Family History:  The patient's family history includes Alcohol abuse in his father; Aneurysm in his father; Cirrhosis in his father; Diabetes in his mother.  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   COPD (chronic obstructive pulmonary disease) (HCC)    DDD (degenerative disc disease), lumbar    Degenerative disc disease, lumbar    Depression    Diabetes mellitus without complication (HCC)    Discitis    GERD (gastroesophageal reflux disease)    Hepatitis C    Hepatitis C    History of kidney stones    History of stomach ulcers    Hx of diabetes mellitus    due to infection   Hypertension    Lung nodules    3 on L and 2 on R   PTSD  (post-traumatic stress disorder)    Sciatica    Substance abuse [facility])     Surgical History: Past Surgical History:  Procedure Laterality Date   FRACTURE SURGERY     HARDWARE REMOVAL  01/27/2012   Procedure: HARDWARE REMOVAL;  Surgeon: Lonni CINDERELLA Poli, MD;  Location: [facility];  Service: Orthopedics;  Laterality: Left;   IR LUMBAR DISC ASPIRATION W/IMG GUIDE  06/08/2017   IR LUMBAR DISC ASPIRATION W/IMG GUIDE  07/06/2021   LAPAROSCOPIC LYSIS OF ADHESIONS  05/24/2022   Procedure: LAPAROSCOPIC LYSIS OF ADHESIONS;  Surgeon: Mavis Anes, MD;  Location: [facility];  Service: General;;   RADIOLOGY WITH ANESTHESIA N/A 06/08/2017   Procedure: DISC ASPIRATION;  Surgeon: Dolphus Carrion, MD;  Location: [facility];  Service: Radiology;  Laterality: N/A;   TIBIA IM NAIL INSERTION  01/27/2012   Procedure: INTRAMEDULLARY (IM) NAIL TIBIAL;  Surgeon: Lonni CINDERELLA Poli, MD;  Location: [facility];  Service: Orthopedics;  Laterality: Left;  Removal of IM Rod and Screws Left Tibia with Exchange Nail, Allograft Bone Graft Left Tibia   XI ROBOTIC ASSISTED INGUINAL HERNIA REPAIR WITH MESH Right 05/24/2022   Procedure: XI ROBOTIC ASSISTED INGUINAL HERNIA REPAIR WITH MESH;  Surgeon: Mavis Anes, MD;  Location: [facility];  Service: General;  Laterality: Right;     Medications:   Current Facility-Administered Medications:    cloNIDine  (CATAPRES ) tablet 0.1 mg, 0.1 mg, Oral, QID, 0.1 mg at 10/15/23 1508 **FOLLOWED BY** [START ON 10/17/2023] cloNIDine  (CATAPRES ) tablet 0.1 mg, 0.1 mg, Oral, BID **FOLLOWED BY** [START ON 10/20/2023] cloNIDine  (CATAPRES ) tablet 0.1 mg, 0.1 mg, Oral, Daily, Joshua Faden, MD   dicyclomine  (BENTYL ) tablet 20 mg, 20 mg, Oral, Q6H PRN, Joshua Faden, MD, 20 mg at 10/15/23 1508   hydrOXYzine  (ATARAX ) tablet 50 mg, 50 mg, Oral, BID PRN, Joshua Faden, MD, 50 mg at 10/15/23 1401   loperamide  (IMODIUM ) capsule 2-4 mg, 2-4 mg, Oral, PRN, Joshua Faden, MD   melatonin tablet 10 mg, 10 mg, Oral,  QHS, Joshua Faden, MD   metFORMIN  (GLUCOPHAGE ) tablet 500 mg, 500 mg, Oral, BID WC, Joshua Faden, MD, 500 mg at 10/15/23 1401   methocarbamol  (ROBAXIN ) tablet 500 mg, 500 mg, Oral, Q8H PRN, Joshua Faden, MD   naproxen  (NAPROSYN ) tablet 500 mg, 500 mg, Oral, BID PRN, Joshua Faden, MD   ondansetron  (ZOFRAN -ODT) disintegrating tablet 4 mg, 4  mg, Oral, Q6H PRN, Joshua Faden, MD   QUEtiapine  (SEROQUEL ) tablet 100 mg, 100 mg, Oral, QHS, Joshua Faden, MD  Current Outpatient Medications:    Buprenorphine  HCl-Naloxone  HCl (SUBOXONE ) 8-2 MG FILM, Place 1 Film under the tongue 2 (two) times daily., Disp: , Rfl:    Calcium  Carbonate Antacid (TUMS PO), Take 2-3 tablets by mouth daily as needed (gerd)., Disp: , Rfl:    divalproex  (DEPAKOTE ) 250 MG DR tablet, Take 250 mg by mouth at bedtime., Disp: , Rfl:    hydrOXYzine  (VISTARIL ) 50 MG capsule, Take 50 mg by mouth 2 (two) times daily as needed for anxiety., Disp: , Rfl:    Melatonin 10 MG TABS, Take 10 mg by mouth at bedtime., Disp: , Rfl:    naloxone  (NARCAN ) nasal spray 4 mg/0.1 mL, Take in case of overdose, Disp: 1 each, Rfl: 0   oxyCODONE -acetaminophen  (PERCOCET) 5-325 MG tablet, Take 1 tablet by mouth every 4 (four) hours as needed for severe pain (pain score 7-10)., Disp: 8 tablet, Rfl: 0   prazosin  (MINIPRESS ) 1 MG capsule, Take 1 mg by mouth at bedtime., Disp: , Rfl:    QUEtiapine  (SEROQUEL ) 100 MG tablet, Take 1 tablet (100 mg total) by mouth at bedtime., Disp: 30 tablet, Rfl: 0   sertraline  (ZOLOFT ) 50 MG tablet, Take 100 mg by mouth daily. (Patient not taking: Reported on 10/13/2022), Disp: , Rfl:   Allergies: Allergies  Allergen Reactions   Tylenol  [Acetaminophen ] Other (See Comments)    Due to Ulcers/ Liver Damage   Morphine  Itching    Ahniyah Giancola A Kline, NP

## 2023-10-15 NOTE — Progress Notes (Signed)
 [facility] LCSW Progress Note   10/15/2023    4:07 PM  Joshua Thomas   [id number]   Type of Contact and Topic:  Psychiatric Bed Placement   Pt accepted to [facility] 401-1     Patient meets inpatient criteria per Kyra Kline, NP    The attending provider will be Dr. Prentis  Call report to [phone]    Sydelle Kerns, RN @ [facility] ED notified.     Pt scheduled  to arrive at [facility] TODAY.    Bunnie Gallop, MSW, LCSW-A  4:09 PM 10/15/2023

## 2023-10-15 NOTE — Progress Notes (Signed)
 Pt in hall, unable to be still, beating on his legs with his fists.  Pt states he aches really bad. Pt laughing inappropriately at medication window, no more medications available at this time. NP notified and agitation protocol advised.  Pt barely able to be still to receive shots, loudly yelling at last one stating his legs were aching.  Will continue to monitor closely.

## 2023-10-15 NOTE — ED Notes (Signed)
 PT changed into burgundy scrubs, urine sample obtained, and security in to wand patient.

## 2023-10-15 NOTE — ED Provider Notes (Signed)
 [place] EMERGENCY DEPARTMENT AT [facility] Provider Note   CSN: [id number] Arrival date & time: 10/15/23  1231     Patient presents with: Suicidal   Joshua Thomas is a 48 y.o. male.   48 year old male with history of diabetes and depression presents with suicidal ideations with plan to take an overdose of fentanyl .  States he uses fentanyl  daily.  States he injects into his arms.  Denies any fever or chills.  No new back pain.  No night sweats.  States he is currently homeless.  Does have a history of suicide attempt before in the past with overdose of fentanyl .  Denies any daily use of alcohol.  States has been off of his psychiatric medications for some time.  States he can no longer deal with his life stressors       Prior to Admission medications   Medication Sig Start Date End Date Taking? Authorizing Provider  Buprenorphine  HCl-Naloxone  HCl (SUBOXONE ) 8-2 MG FILM Place 1 Film under the tongue 2 (two) times daily.    [provider]  Calcium  Carbonate Antacid (TUMS PO) Take 2-3 tablets by mouth daily as needed (gerd).    [provider]  divalproex  (DEPAKOTE ) 250 MG DR tablet Take 250 mg by mouth at bedtime. 03/09/22   [provider]  hydrOXYzine  (VISTARIL ) 50 MG capsule Take 50 mg by mouth 2 (two) times daily as needed for anxiety.    [provider]  Melatonin 10 MG TABS Take 10 mg by mouth at bedtime.    [provider]  naloxone  (NARCAN ) nasal spray 4 mg/0.1 mL Take in case of overdose 08/07/20   Long, Joshua G, MD  oxyCODONE -acetaminophen  (PERCOCET) 5-325 MG tablet Take 1 tablet by mouth every 4 (four) hours as needed for severe pain (pain score 7-10). 02/21/23   Bradler, Evan K, MD  prazosin  (MINIPRESS ) 1 MG capsule Take 1 mg by mouth at bedtime. 07/31/21   [provider]  QUEtiapine  (SEROQUEL ) 100 MG tablet Take 1 tablet (100 mg total) by mouth at bedtime. 10/16/22   Jillian Buttery, MD  sertraline  (ZOLOFT )  50 MG tablet Take 100 mg by mouth daily. Patient not taking: Reported on 10/13/2022    [provider]    Allergies: Tylenol  [acetaminophen ] and Morphine     Review of Systems  All other systems reviewed and are negative.   Updated Vital Signs BP (!) 134/120   Pulse 63   Temp 98 F (36.7 C) (Oral)   Resp 16   SpO2 100%   Physical Exam Vitals and nursing note reviewed.  Constitutional:      General: He is not in acute distress.    Appearance: Normal appearance. He is well-developed. He is not toxic-appearing.  HENT:     Head: Normocephalic and atraumatic.  Eyes:     General: Lids are normal.     Conjunctiva/sclera: Conjunctivae normal.     Pupils: Pupils are equal, round, and reactive to light.  Neck:     Thyroid : No thyroid  mass.     Trachea: No tracheal deviation.  Cardiovascular:     Rate and Rhythm: Normal rate and regular rhythm.     Heart sounds: Normal heart sounds. No murmur heard.    No gallop.  Pulmonary:     Effort: Pulmonary effort is normal. No respiratory distress.     Breath sounds: Normal breath sounds. No stridor. No decreased breath sounds, wheezing, rhonchi or rales.  Abdominal:  General: There is no distension.     Palpations: Abdomen is soft.     Tenderness: There is no abdominal tenderness. There is no rebound.  Musculoskeletal:        General: No tenderness. Normal range of motion.     Cervical back: Normal range of motion and neck supple.  Skin:    General: Skin is warm and dry.     Findings: No abrasion or rash.  Neurological:     Mental Status: He is alert and oriented to person, place, and time. Mental status is at baseline.     GCS: GCS eye subscore is 4. GCS verbal subscore is 5. GCS motor subscore is 6.     Cranial Nerves: No cranial nerve deficit.     Sensory: No sensory deficit.     Motor: Motor function is intact.  Psychiatric:        Attention and Perception: Attention normal.        Mood and Affect: Affect is flat.         Speech: Speech normal.        Behavior: Behavior is withdrawn.        Thought Content: Thought content includes suicidal ideation. Thought content includes suicidal plan.     (all labs ordered are listed, but only abnormal results are displayed) Labs Reviewed  COMPREHENSIVE METABOLIC PANEL WITH GFR - Abnormal; Notable for the following components:      Result Value   Sodium 134 (*)    Glucose, Bld 286 (*)    All other components within normal limits  CBC - Abnormal; Notable for the following components:   Hemoglobin 12.8 (*)    All other components within normal limits  ETHANOL  URINE DRUG SCREEN    EKG: None  Radiology: No results found.   Procedures   Medications Ordered in the ED - No data to display                                  Medical Decision Making Amount and/or Complexity of Data Reviewed Labs: ordered.   Patient's labs significant for mild hyperglycemia with sugar of 286.  No evidence of DKA.  Anion gap is normal.  Will restart patient's medications at this time.  He is medically clear for psychiatric disposition     Final diagnoses:  None    ED Discharge Orders     None          Dasie Faden, MD 10/15/23 1347

## 2023-10-15 NOTE — Progress Notes (Signed)
 Pt admitted to [facility] under voluntary status from [facility]. Per nursing report and chart review; pt presented to the ED with SI and plan to overdose on Fentanyl  in the context of medication noncompliance; with stressor being long term Fentanyl  abuse. Reports he last used on 10/14/23 I injected myself in the arms yesterday. I've been using Fentanyl  for a long time. I was cleaned for 15 months but I started back a month ago when asked of events leading to admission. Pt does have multiple comorbidity including diabetes, COPD, HTN, MDD. Presents very restless, irritable / agitated with blunted affect, avertive to brief eye contact, demanding and defensive on interactions. Denies HI and AVH. Endorses SI with plan to overdose on Fentanyl  I can't do that in here. Required multiple prompts, redirections to complete initial assessment. Vitals done, BP elevated; pt unable to sit still, very restless. Skin assessment done, multiple scratch marks noted to upper back, tattoos scattered all over pt's body. Pt's belongings searched, items deemed contraband secured in assigned locker. Pt ambulatory to unit with steady gait. Unit orientation done, routines discussed, care plan reviewed with pt and admission documents signed. Safety checks initiated at Q 15 minutes intervals. Pt tolerates dinner tray, fluids and medications well. Emotional support, encouragement and reassurance offered.

## 2023-10-15 NOTE — Group Note (Unsigned)
 Date:  10/15/2023 Time:  8:42 PM  Group Topic/Focus:  Wrap-Up Group:   The focus of this group is to help patients review their daily goal of treatment and discuss progress on daily workbooks.     Participation Level:  {[facility] PARTICIPATION OZCZO:77735}  Participation Quality:  {B[facility] PARTICIPATION QUALITY:22265}  Affect:  {BHH AFFECT:22266}  Cognitive:  {B[facility] COGNITIVE:22267}  Insight: {BHH Insight2:20797}  Engagement in Group:  {BHH ENGAGEMENT IN HMNLE:77731}  Modes of Intervention:  {BHH MODES OF INTERVENTION:22269}  Additional Comments:  ***  Bari Moats 10/15/2023, 8:42 PM

## 2023-10-15 NOTE — ED Triage Notes (Signed)
 Pt presents reporting many life stressors that are my own doing  pt states he has thoughts of suicide.  No homicidal thoughts.  Pt reports he does use fentanyl  daily, last use last night.

## 2023-10-15 NOTE — Tx Team (Signed)
 Initial Treatment Plan 10/15/2023 6:32 PM Joshua Thomas [id number]    PATIENT STRESSORS: Financial difficulties   Health problems   Medication change or noncompliance   Occupational concerns   Substance abuse     PATIENT STRENGTHS: Average or above average intelligence  Capable of independent living  Communication skills    PATIENT IDENTIFIED PROBLEMS: Risk for self harm (SI, self harm thoughts)    Substance Abuse I've been using Fentanyl  for very long time. I relapsed a month ago.    Homeless    Health Concerns (Diabetes, COPD, HTN, Hepatitis)         DISCHARGE CRITERIA:  Improved stabilization in mood, thinking, and/or behavior Verbal commitment to aftercare and medication compliance Withdrawal symptoms are absent or subacute and managed without 24-hour nursing intervention  PRELIMINARY DISCHARGE PLAN: Outpatient therapy Placement in alternative living arrangements  PATIENT/FAMILY INVOLVEMENT: This treatment plan has been presented to and reviewed with the patient, Joshua Thomas. The patient have been given the opportunity to ask questions and make suggestions.  Brandi Tomlinson, RN 10/15/2023, 6:32 PM

## 2023-10-16 ENCOUNTER — Encounter (HOSPITAL_COMMUNITY): Payer: Self-pay | Admitting: Psychiatry

## 2023-10-16 DIAGNOSIS — M5416 Radiculopathy, lumbar region: Secondary | ICD-10-CM | POA: Insufficient documentation

## 2023-10-16 LAB — GLUCOSE, CAPILLARY
Glucose-Capillary: 193 mg/dL — ABNORMAL HIGH (ref 70–99)
Glucose-Capillary: 226 mg/dL — ABNORMAL HIGH (ref 70–99)

## 2023-10-16 MED ORDER — INSULIN ASPART 100 UNIT/ML IJ SOLN
0.0000 [IU] | Freq: Three times a day (TID) | INTRAMUSCULAR | Status: DC
  Administered 2023-10-16 – 2023-10-17 (×3): 2 [IU] via SUBCUTANEOUS
  Administered 2023-10-18: 3 [IU] via SUBCUTANEOUS
  Administered 2023-10-20: 2 [IU] via SUBCUTANEOUS

## 2023-10-16 MED ORDER — BUPRENORPHINE HCL-NALOXONE HCL 8-2 MG SL SUBL
1.0000 | SUBLINGUAL_TABLET | Freq: Two times a day (BID) | SUBLINGUAL | Status: DC
  Administered 2023-10-16 – 2023-10-25 (×19): 1 via SUBLINGUAL
  Filled 2023-10-16 (×19): qty 1

## 2023-10-16 MED ORDER — SERTRALINE HCL 50 MG PO TABS
50.0000 mg | ORAL_TABLET | Freq: Every day | ORAL | Status: DC
  Administered 2023-10-17 – 2023-10-25 (×9): 50 mg via ORAL
  Filled 2023-10-16 (×9): qty 1

## 2023-10-16 NOTE — Progress Notes (Signed)
   10/16/23 0900  Psych Admission Type (Psych Patients Only)  Admission Status Voluntary  Psychosocial Assessment  Patient Complaints Substance abuse;Anxiety  Eye Contact Brief  Facial Expression Anxious  Affect Anxious  Speech Logical/coherent  Interaction Assertive  Motor Activity Restless  Appearance/Hygiene Poor hygiene  Behavior Characteristics Appropriate to situation  Mood Anxious  Thought Process  Coherency WDL  Content Blaming self  Delusions None reported or observed  Perception WDL  Hallucination None reported or observed  Judgment Impaired  Confusion None  Danger to Self  Current suicidal ideation? Denies  Danger to Others  Danger to Others None reported or observed

## 2023-10-16 NOTE — Progress Notes (Signed)
   10/16/23 2110  Psych Admission Type (Psych Patients Only)  Admission Status Voluntary  Psychosocial Assessment  Patient Complaints Substance abuse;Restlessness;Anxiety  Eye Contact Poor  Facial Expression Anxious  Affect Appropriate to circumstance;Anxious  Speech Logical/coherent  Interaction Assertive  Motor Activity Restless;Tremors  Appearance/Hygiene Poor hygiene  Behavior Characteristics Appropriate to situation  Mood Anxious  Thought Process  Coherency WDL  Content Blaming self  Delusions None reported or observed  Perception WDL  Hallucination None reported or observed  Judgment Impaired  Confusion None  Danger to Self  Current suicidal ideation? Denies  Danger to Others  Danger to Others None reported or observed

## 2023-10-16 NOTE — BHH Counselor (Signed)
 CSW attempted to complete PSA. Pt asked CSW if she had ever been somewhere that CSW was bothered every single minute. Pt stated I can't answer any of these questions right now as I just don't know. Maybe if I get some sleep I can answer them. Asked if this can be done tomorrow. CSW stated she would notate his request.

## 2023-10-16 NOTE — Group Note (Signed)
 Date:  10/16/2023 Time:  10:34 AM  Group Topic/Focus:  Emotional Education:   The focus of this group is to discuss what feelings/emotions are, and how they are experienced.    Participation Level:  Did Not Attend   Joshua Thomas 10/16/2023, 10:34 AM

## 2023-10-16 NOTE — Plan of Care (Signed)
  Problem: Education: Goal: Emotional status will improve Outcome: Not Progressing Goal: Mental status will improve Outcome: Not Progressing   Problem: Activity: Goal: Interest or engagement in activities will improve Outcome: Not Progressing   Problem: Coping: Goal: Ability to demonstrate self-control will improve Outcome: Not Progressing

## 2023-10-16 NOTE — Plan of Care (Signed)
  Problem: Education: Goal: Knowledge of [place] [facility] Education information/materials will improve Outcome: Progressing Goal: Emotional status will improve Outcome: Progressing   Problem: Activity: Goal: Interest or engagement in activities will improve Outcome: Progressing Goal: Sleeping patterns will improve Outcome: Progressing

## 2023-10-16 NOTE — [facility] Group Notes (Signed)
 Psychoeducational Group Note  Date:  10/16/2023 Time:  2000  Group Topic/Focus:  Wrap up group  Participation Level: Did Not Attend  Participation Quality:  Not Applicable  Affect:  Not Applicable  Cognitive:  Not Applicable  Insight:  Not Applicable  Engagement in Group: Not Applicable  Additional Comments:  Did not attend.   Lenora Manuelita RAMAN 10/16/2023, 8:53 PM

## 2023-10-16 NOTE — Group Note (Signed)
 Date:  10/16/2023 Time:  6:43 PM  Group Topic/Focus:   Group used a non- competitive card game to build mindful listening skills and encourage acceptance and understanding of oneself and peers. Patients shared, fostering empathy and personal growth in a supportive environment.    Participation Level:  Did Not Attend    Joshua Thomas 10/16/2023, 6:43 PM

## 2023-10-16 NOTE — Plan of Care (Signed)
   Problem: Education: Goal: Knowledge of [place] [facility] Education information/materials will improve Outcome: Progressing

## 2023-10-16 NOTE — Group Note (Signed)
 Date:  10/16/2023 Time:  10:13 AM  Group Topic/Focus:  Goals Group:   The focus of this group is to help patients establish daily goals to achieve during treatment and discuss how the patient can incorporate goal setting into their daily lives to aide in recovery. Dimensions of Wellness:   The focus of this group is to introduce the topic of wellness and discuss the role each dimension of wellness plays in total health.  Participation Level:  Did Not Attend   Joshua Thomas 10/16/2023, 10:13 AM
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
## 2023-10-16 NOTE — Progress Notes (Signed)
(  Sleep Hours) - 2.25 hours (Any PRNs that were needed, meds refused, or side effects to meds)-  Vistaril , Robaxin , zofran , agitation protocol IMs given  (Any disturbances and when (visitation, over night)- Pt withdrawing heavily, unable to rest or sleep (Concerns raised by the patient)-  Withdrawal, inability to sleep (SI/HI/AVH)-  Passive SI

## 2023-10-17 ENCOUNTER — Encounter (HOSPITAL_COMMUNITY): Payer: Self-pay

## 2023-10-17 DIAGNOSIS — F1994 Other psychoactive substance use, unspecified with psychoactive substance-induced mood disorder: Secondary | ICD-10-CM

## 2023-10-17 DIAGNOSIS — F332 Major depressive disorder, recurrent severe without psychotic features: Principal | ICD-10-CM

## 2023-10-17 DIAGNOSIS — I10 Essential (primary) hypertension: Secondary | ICD-10-CM

## 2023-10-17 DIAGNOSIS — F1120 Opioid dependence, uncomplicated: Secondary | ICD-10-CM

## 2023-10-17 DIAGNOSIS — K219 Gastro-esophageal reflux disease without esophagitis: Secondary | ICD-10-CM

## 2023-10-17 LAB — LIPID PANEL
Cholesterol: 138 mg/dL (ref 0–200)
HDL: 30 mg/dL — ABNORMAL LOW (ref >40)
LDL Cholesterol: 95 mg/dL (ref 0–99)
Total CHOL/HDL Ratio: 4.6 ratio
Triglycerides: 66 mg/dL (ref <150)
VLDL: 13 mg/dL (ref 0–40)

## 2023-10-17 LAB — GLUCOSE, CAPILLARY
Glucose-Capillary: 134 mg/dL — ABNORMAL HIGH (ref 70–99)
Glucose-Capillary: 175 mg/dL — ABNORMAL HIGH (ref 70–99)
Glucose-Capillary: 199 mg/dL — ABNORMAL HIGH (ref 70–99)
Glucose-Capillary: 212 mg/dL — ABNORMAL HIGH (ref 70–99)

## 2023-10-17 LAB — HEMOGLOBIN A1C
Hgb A1c MFr Bld: 6.7 % — ABNORMAL HIGH (ref 4.8–5.6)
Mean Plasma Glucose: 145.59 mg/dL

## 2023-10-17 MED ORDER — BENZOCAINE 10 % MT GEL
Freq: Four times a day (QID) | OROMUCOSAL | Status: DC | PRN
  Filled 2023-10-17: qty 9

## 2023-10-17 NOTE — BH IP Treatment Plan (Signed)
 Interdisciplinary Treatment and Diagnostic Plan Update  10/17/2023 Time of Session: 10:10AM Joshua Thomas MRN: [id number]  Principal Diagnosis: Opioid use disorder, severe, dependence (HCC)  Secondary Diagnoses: Principal Problem:   Opioid use disorder, severe, dependence (HCC) Active Problems:   GERD (gastroesophageal reflux disease)   Essential hypertension   MDD (major depressive disorder), recurrent severe, without psychosis (HCC)   Substance induced mood disorder (HCC)   Current Medications:  Current Facility-Administered Medications  Medication Dose Route Frequency Provider Last Rate Last Admin   alum & mag hydroxide-simeth (MAALOX/MYLANTA) 200-200-20 MG/5ML suspension 30 mL  30 mL Oral Q4H PRN Kline, Kyra A, NP       buprenorphine -naloxone  (SUBOXONE ) 8-2 mg per SL tablet 1 tablet  1 tablet Sublingual Q12H Lynnette Barter, MD   1 tablet at 10/17/23 1141   cloNIDine  (CATAPRES ) tablet 0.1 mg  0.1 mg Oral BID Kline, Kyra A, NP       Followed by   NOREEN ON 10/20/2023] cloNIDine  (CATAPRES ) tablet 0.1 mg  0.1 mg Oral Daily Kline, Kyra A, NP       dicyclomine  (BENTYL ) tablet 20 mg  20 mg Oral Q6H PRN Kline, Kyra A, NP       haloperidol  (HALDOL ) tablet 5 mg  5 mg Oral TID PRN Kline, Kyra A, NP       And   diphenhydrAMINE  (BENADRYL ) capsule 50 mg  50 mg Oral TID PRN Kline, Kyra A, NP       haloperidol  lactate (HALDOL ) injection 5 mg  5 mg Intramuscular TID PRN Kline, Kyra A, NP   5 mg at 10/15/23 2333   And   diphenhydrAMINE  (BENADRYL ) injection 50 mg  50 mg Intramuscular TID PRN Kline, Kyra A, NP       And   LORazepam  (ATIVAN ) injection 2 mg  2 mg Intramuscular TID PRN Kline, Kyra A, NP       haloperidol  lactate (HALDOL ) injection 10 mg  10 mg Intramuscular TID PRN Kline, Kyra A, NP       And   diphenhydrAMINE  (BENADRYL ) injection 50 mg  50 mg Intramuscular TID PRN Kline, Kyra A, NP   50 mg at 10/15/23 2333   And   LORazepam  (ATIVAN ) injection 2 mg  2 mg Intramuscular TID PRN Kline,  Kyra A, NP   2 mg at 10/15/23 2333   hydrOXYzine  (ATARAX ) tablet 50 mg  50 mg Oral BID PRN Kline, Kyra A, NP   50 mg at 10/16/23 2109   insulin  aspart (novoLOG ) injection 0-9 Units  0-9 Units Subcutaneous TID WC Lynnette Barter, MD   2 Units at 10/17/23 1151   loperamide  (IMODIUM ) capsule 2-4 mg  2-4 mg Oral PRN Kline, Kyra A, NP       metFORMIN  (GLUCOPHAGE ) tablet 500 mg  500 mg Oral BID WC Kline, Kyra A, NP   500 mg at 10/17/23 1141   methocarbamol  (ROBAXIN ) tablet 500 mg  500 mg Oral Q8H PRN Kline, Kyra A, NP   500 mg at 10/16/23 2109   naproxen  (NAPROSYN ) tablet 500 mg  500 mg Oral BID PRN Kline, Kyra A, NP   500 mg at 10/15/23 1816   nicotine  (NICODERM CQ  - dosed in mg/24 hours) patch 14 mg  14 mg Transdermal Daily Prentis Kitchens A, DO   14 mg at 10/17/23 1141   ondansetron  (ZOFRAN -ODT) disintegrating tablet 4 mg  4 mg Oral Q6H PRN Kline, Kyra A, NP   4 mg at 10/16/23 0902   QUEtiapine  (SEROQUEL )  tablet 100 mg  100 mg Oral QHS Kline, Kyra A, NP   100 mg at 10/16/23 2109   sertraline  (ZOLOFT ) tablet 50 mg  50 mg Oral Daily Ji, Andrew, MD   50 mg at 10/17/23 1141   PTA Medications: Medications Prior to Admission  Medication Sig Dispense Refill Last Dose/Taking   ADDERALL 10 MG tablet Take 10 mg by mouth daily.   Unknown   cloNIDine  (CATAPRES ) 0.1 MG tablet Take 0.1 mg by mouth at bedtime.   Unknown   cyclobenzaprine  (FLEXERIL ) 10 MG tablet Take 10 mg by mouth 3 (three) times daily as needed.   Unknown   DULoxetine  (CYMBALTA ) 30 MG capsule Take 30 mg by mouth daily.   Unknown   QUEtiapine  Fumarate (SEROQUEL  XR) 150 MG 24 hr tablet Take 150 mg by mouth at bedtime.   Unknown   Buprenorphine  HCl-Naloxone  HCl (SUBOXONE ) 8-2 MG FILM Place 1 Film under the tongue 2 (two) times daily.   Unknown   naloxone  (NARCAN ) nasal spray 4 mg/0.1 mL Take in case of overdose 1 each 0 Unknown    Patient Stressors: Financial difficulties   Health problems   Medication change or noncompliance   Occupational  concerns   Substance abuse    Patient Strengths: Average or above average intelligence  Capable of independent living  Communication skills   Treatment Modalities: Medication Management, Group therapy, Case management,  1 to 1 session with clinician, Psychoeducation, Recreational therapy.   Physician Treatment Plan for Primary Diagnosis: Opioid use disorder, severe, dependence (HCC) Long Term Goal(s):     Short Term Goals:    Medication Management: Evaluate patient's response, side effects, and tolerance of medication regimen.  Therapeutic Interventions: 1 to 1 sessions, Unit Group sessions and Medication administration.  Evaluation of Outcomes: Not Progressing  Physician Treatment Plan for Secondary Diagnosis: Principal Problem:   Opioid use disorder, severe, dependence (HCC) Active Problems:   GERD (gastroesophageal reflux disease)   Essential hypertension   MDD (major depressive disorder), recurrent severe, without psychosis (HCC)   Substance induced mood disorder (HCC)  Long Term Goal(s):     Short Term Goals:       Medication Management: Evaluate patient's response, side effects, and tolerance of medication regimen.  Therapeutic Interventions: 1 to 1 sessions, Unit Group sessions and Medication administration.  Evaluation of Outcomes: Not Progressing   RN Treatment Plan for Primary Diagnosis: Opioid use disorder, severe, dependence (HCC) Long Term Goal(s): Knowledge of disease and therapeutic regimen to maintain health will improve  Short Term Goals: Ability to remain free from injury will improve, Ability to verbalize frustration and anger appropriately will improve, Ability to demonstrate self-control, Ability to participate in decision making will improve, Ability to verbalize feelings will improve, and Compliance with prescribed medications will improve  Medication Management: RN will administer medications as ordered by provider, will assess and evaluate  patient's response and provide education to patient for prescribed medication. RN will report any adverse and/or side effects to prescribing provider.  Therapeutic Interventions: 1 on 1 counseling sessions, Psychoeducation, Medication administration, Evaluate responses to treatment, Monitor vital signs and CBGs as ordered, Perform/monitor CIWA, COWS, AIMS and Fall Risk screenings as ordered, Perform wound care treatments as ordered.  Evaluation of Outcomes: Not Progressing   LCSW Treatment Plan for Primary Diagnosis: Opioid use disorder, severe, dependence (HCC) Long Term Goal(s): Safe transition to appropriate next level of care at discharge, Engage patient in therapeutic group addressing interpersonal concerns.  Short Term Goals: Engage patient in  aftercare planning with referrals and resources, Increase social support, Increase ability to appropriately verbalize feelings, Increase emotional regulation, Facilitate acceptance of mental health diagnosis and concerns, and Increase skills for wellness and recovery  Therapeutic Interventions: Assess for all discharge needs, 1 to 1 time with Social worker, Explore available resources and support systems, Assess for adequacy in community support network, Educate family and significant other(s) on suicide prevention, Complete Psychosocial Assessment, Interpersonal group therapy.  Evaluation of Outcomes: Not Progressing   Progress in Treatment: Attending groups: No. Participating in groups: No. Taking medication as prescribed: Yes. Toleration medication: Yes. Family/Significant other contact made: No, will contact:  pt declined to give consent to contact family/friends. Patient understands diagnosis: Yes. Discussing patient identified problems/goals with staff: Yes. Medical problems stabilized or resolved: Yes. Denies suicidal/homicidal ideation: Yes. Issues/concerns per patient self-inventory: No.  Patient Goals:  get myself  together.  Discharge Plan or Barriers: Patient will likely discharge to shelter once stable.   Reason for Continuation of Hospitalization: Depression Medication stabilization Withdrawal symptoms  Estimated Length of Stay: 5-7 days  Last 3 [place] Suicide Severity Risk Score: Flowsheet Row [facility] (Current[facility] Most recent reading at 10/15/2023  5:34 PM ED from 10/15/2023 in [facility] at [facility] Most recent reading at 10/15/2023  1:32 PM ED from 02/21/2023 in [facility] at [facility] Most recent reading at 02/21/2023  5:44 PM  C-SSRS RISK CATEGORY Moderate Risk High Risk No Risk    Last PHQ 2/9 Scores:    09/16/2022    9:41 AM 09/06/2022   12:43 PM 09/04/2022   12:57 AM  Depression screen PHQ 2/9  Decreased Interest 0 0 1  Down, Depressed, Hopeless 0 0 1  PHQ - 2 Score 0 0 2  Altered sleeping   1  Tired, decreased energy   1  Change in appetite   0  Feeling bad or failure about yourself    0  Trouble concentrating   1  Moving slowly or fidgety/restless   0  Suicidal thoughts   0  PHQ-9 Score   5  Difficult doing work/chores   Somewhat difficult    Scribe for Treatment Team: Lorayne Getchell M Samiyah Stupka, [place] 10/17/2023 1:52 PM

## 2023-10-17 NOTE — Progress Notes (Incomplete)
 [facility] MD Progress Note  10/18/2023 7:21 AM Joshua Thomas  MRN:  [id number]  Reason for admission: Joshua Thomas is a 48 y.o., male with history of opioid use disorder, substance-induced mood disorder, stimulant use disorder-amphetamine type, homelessness, COPD, hepatitis C, hypertension, diabetes, IV drug use complicated by osteomyelitis presents voluntarily to behavioral health hospital due to suicidal ideation with plan to overdose on fentanyl .   Principal Problem: Opioid use disorder, severe, dependence (HCC) Diagnosis: Principal Problem:   Opioid use disorder, severe, dependence (HCC) Active Problems:   GERD (gastroesophageal reflux disease)   Essential hypertension   MDD (major depressive disorder), recurrent severe, without psychosis (HCC)   Substance induced mood disorder (HCC)  Total Time spent with patient: 45 minutes    24-hour Chart Review: Chart reviewed. Patient's case discussed in interdisciplinary team meeting.  Vital signs reviewed without critical value. As needed hydroxyzine  required overnight.  He slept a documented 7.25 hours.  He is adherent to taking psychotropic medication regimen. Nursing notes indicate no behavioral challenges on the unit.   Today's Assessment Notes: On assessment today, [name] reports his mood as "it's going." He endorses oral pain and reports that his withdrawal symptoms are improving. He reports experiencing mild cravings "here and there." He acknowledges passive thoughts of death without intent or plan. Appetite has improved, and he states that his sleep has been effective with medication. He reports low energy and states that his concentration has been affected since he has not been taking Adderall during his admission. [name] states he has not yet attended groups or unit activities but plans to participate starting tomorrow. He expressed possible interest in inpatient substance use treatment and intends to explore options. He also requests a referral for  outpatient therapy following discharge.   10/6: Patient appears mildly withdrawn but is less malaised today. He continues on the COWS opioid withdrawal protocol in detox and is tolerating his psychiatric medications well without reported side effects. Nursing reports that the patient lost a tooth today. No acute safety concerns noted at this time; passive thoughts of death remain without intent or plan. Blood sugars have ranged from 134 to 226. Continue current psychotropic medications, no changes indicated today.     Past Psychiatric History:  Previous psych diagnoses: ADHD, MDD, PTSD, OUD, stimulant use disorder, alchol use disorder Prior inpatient psychiatric treatment: several Prior outpatient psychiatric treatment: endorses Current psychiatric provider: [facility] Current therapist:denies   History of suicide attempts: endorses History of homicide: endorses   Past Psychotropics:    Substance Use History: Alcohol: drank extensively in the past, in remission Tobacco: daily Illicit Substance: fentanyl , cocaine, methamphetamine   Past Medical History:  Past Medical History:  Diagnosis Date   Acute on chronic osteomyelitis (HCC) 07/04/2021   Acute osteomyelitis of lumbar spine (HCC) 05/17/2017   ADHD (attention deficit hyperactivity disorder)    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   Bipolar 1 disorder (HCC)    Chronic back pain    Chronic pain    chronic l leg pain   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   COPD (chronic obstructive pulmonary disease) (HCC)    DDD (degenerative disc disease), lumbar    Degenerative disc disease, lumbar    Depression    Diabetes mellitus without complication (HCC)    Discitis    Epidural abscess 10/08/2021   GERD (gastroesophageal reflux disease)    Hepatitis C    Hepatitis C    History of kidney stones  History of stomach ulcers    Hx of diabetes mellitus    due to infection   Hypertension    Lung  nodules    3 on L and 2 on R   Osteomyelitis of lumbar spine (HCC) 10/13/2022   Polysubstance abuse (HCC) 09/03/2022   PTSD (post-traumatic stress disorder)    Sciatica    Substance abuse [facility])     Past Surgical History:  Procedure Laterality Date   FRACTURE SURGERY     HARDWARE REMOVAL  01/27/2012   Procedure: HARDWARE REMOVAL;  Surgeon: Lonni CINDERELLA Poli, MD;  Location: [facility];  Service: Orthopedics;  Laterality: Left;   IR LUMBAR DISC ASPIRATION W/IMG GUIDE  06/08/2017   IR LUMBAR DISC ASPIRATION W/IMG GUIDE  07/06/2021   LAPAROSCOPIC LYSIS OF ADHESIONS  05/24/2022   Procedure: LAPAROSCOPIC LYSIS OF ADHESIONS;  Surgeon: Mavis Anes, MD;  Location: [facility];  Service: General;;   RADIOLOGY WITH ANESTHESIA N/A 06/08/2017   Procedure: DISC ASPIRATION;  Surgeon: Dolphus Carrion, MD;  Location: [facility] OR;  Service: Radiology;  Laterality: N/A;   TIBIA IM NAIL INSERTION  01/27/2012   Procedure: INTRAMEDULLARY (IM) NAIL TIBIAL;  Surgeon: Lonni CINDERELLA Poli, MD;  Location: [facility];  Service: Orthopedics;  Laterality: Left;  Removal of IM Rod and Screws Left Tibia with Exchange Nail, Allograft Bone Graft Left Tibia   XI ROBOTIC ASSISTED INGUINAL HERNIA REPAIR WITH MESH Right 05/24/2022   Procedure: XI ROBOTIC ASSISTED INGUINAL HERNIA REPAIR WITH MESH;  Surgeon: Mavis Anes, MD;  Location: [facility];  Service: General;  Laterality: Right;   Family History:  Family History  Problem Relation Age of Onset   Diabetes Mother    Alcohol abuse Father    Cirrhosis Father    Aneurysm Father    Family Psychiatric  History: As listed above Social History:  Social History   Substance and Sexual Activity  Alcohol Use Not Currently   Alcohol/week: 1.0 standard drink of alcohol   Types: 1 Standard drinks or equivalent per week   Comment: last used July 2023     Social History   Substance and Sexual Activity  Drug Use Not Currently   Types: IV, Fentanyl , Benzodiazepines   Comment: last used  July 2023    Social History   Socioeconomic History   Marital status: Single    Spouse name: Not on file   Number of children: Not on file   Years of education: 12   Highest education level: High school graduate  Occupational History   Not on file  Tobacco Use   Smoking status: Every Day    Current packs/day: 1.50    Average packs/day: 1.5 packs/day for 33.0 years (49.5 ttl pk-yrs)    Types: Cigarettes   Smokeless tobacco: Former    Quit date: 08/08/2006   Tobacco comments:    1 PPD  Vaping Use   Vaping status: Every Day  Substance and Sexual Activity   Alcohol use: Not Currently    Alcohol/week: 1.0 standard drink of alcohol    Types: 1 Standard drinks or equivalent per week    Comment: last used July 2023   Drug use: Not Currently    Types: IV, Fentanyl , Benzodiazepines    Comment: last used July 2023   Sexual activity: Not Currently  Other Topics Concern   Not on file  Social History Narrative   Not on file   Social Drivers of Health   Financial Resource Strain: High Risk (09/23/2017)   Overall  Financial Resource Strain (CARDIA)    Difficulty of Paying Living Expenses: Very hard  Food Insecurity: Food Insecurity Present (10/15/2023)   Hunger Vital Sign    Worried About Running Out of Food in the Last Year: Sometimes true    Ran Out of Food in the Last Year: Sometimes true  Transportation Needs: Unmet Transportation Needs (10/15/2023)   PRAPARE - Administrator, Civil Service (Medical): Yes    Lack of Transportation (Non-Medical): Yes  Physical Activity: Unknown (09/23/2017)   Exercise Vital Sign    Days of Exercise per Week: Patient declined    Minutes of Exercise per Session: Patient declined  Stress: Stress Concern Present (09/23/2017)   [employer] of Occupational Health - Occupational Stress Questionnaire    Feeling of Stress : Very much  Social Connections: Socially Isolated (09/23/2017)   Social Connection and Isolation Panel    Frequency  of Communication with Friends and Family: Once a week    Frequency of Social Gatherings with Friends and Family: Once a week    Attends Religious Services: Never    Database administrator or Organizations: No    Attends Engineer, structural: Never    Marital Status: Never married   Additional Social History:                           Current Medications: Current Facility-Administered Medications  Medication Dose Route Frequency Provider Last Rate Last Admin   alum & mag hydroxide-simeth (MAALOX/MYLANTA) 200-200-20 MG/5ML suspension 30 mL  30 mL Oral Q4H PRN Kline, Kyra A, NP       benzocaine (ORAJEL) 10 % mucosal gel   Mouth/Throat QID PRN Aldonia Keeven H, NP       buprenorphine -naloxone  (SUBOXONE ) 8-2 mg per SL tablet 1 tablet  1 tablet Sublingual Q12H Lynnette Barter, MD   1 tablet at 10/17/23 2103   cloNIDine  (CATAPRES ) tablet 0.1 mg  0.1 mg Oral BID Kline, Kyra A, NP   0.1 mg at 10/17/23 1725   Followed by   NOREEN ON 10/20/2023] cloNIDine  (CATAPRES ) tablet 0.1 mg  0.1 mg Oral Daily Kline, Kyra A, NP       dicyclomine  (BENTYL ) tablet 20 mg  20 mg Oral Q6H PRN Kline, Kyra A, NP       haloperidol  (HALDOL ) tablet 5 mg  5 mg Oral TID PRN Kline, Kyra A, NP       And   diphenhydrAMINE  (BENADRYL ) capsule 50 mg  50 mg Oral TID PRN Kline, Kyra A, NP       haloperidol  lactate (HALDOL ) injection 5 mg  5 mg Intramuscular TID PRN Kline, Kyra A, NP   5 mg at 10/15/23 2333   And   diphenhydrAMINE  (BENADRYL ) injection 50 mg  50 mg Intramuscular TID PRN Kline, Kyra A, NP       And   LORazepam  (ATIVAN ) injection 2 mg  2 mg Intramuscular TID PRN Kline, Kyra A, NP       haloperidol  lactate (HALDOL ) injection 10 mg  10 mg Intramuscular TID PRN Kline, Kyra A, NP       And   diphenhydrAMINE  (BENADRYL ) injection 50 mg  50 mg Intramuscular TID PRN Kline, Kyra A, NP   50 mg at 10/15/23 2333   And   LORazepam  (ATIVAN ) injection 2 mg  2 mg Intramuscular TID PRN Kline, Kyra A, NP   2 mg at  10/15/23 2333   hydrOXYzine  (  ATARAX ) tablet 50 mg  50 mg Oral BID PRN Kline, Kyra A, NP   50 mg at 10/17/23 2103   insulin  aspart (novoLOG ) injection 0-9 Units  0-9 Units Subcutaneous TID WC Ji, Andrew, MD   2 Units at 10/17/23 1723   loperamide  (IMODIUM ) capsule 2-4 mg  2-4 mg Oral PRN Kline, Kyra A, NP       metFORMIN  (GLUCOPHAGE ) tablet 500 mg  500 mg Oral BID WC Kline, Kyra A, NP   500 mg at 10/17/23 1725   methocarbamol  (ROBAXIN ) tablet 500 mg  500 mg Oral Q8H PRN Kline, Kyra A, NP   500 mg at 10/17/23 2103   naproxen  (NAPROSYN ) tablet 500 mg  500 mg Oral BID PRN Kline, Kyra A, NP   500 mg at 10/17/23 2103   nicotine  (NICODERM CQ  - dosed in mg/24 hours) patch 14 mg  14 mg Transdermal Daily Prentis Kitchens A, DO   14 mg at 10/17/23 1141   ondansetron  (ZOFRAN -ODT) disintegrating tablet 4 mg  4 mg Oral Q6H PRN Kline, Kyra A, NP   4 mg at 10/16/23 0902   QUEtiapine  (SEROQUEL ) tablet 100 mg  100 mg Oral QHS Kline, Kyra A, NP   100 mg at 10/17/23 2103   sertraline  (ZOLOFT ) tablet 50 mg  50 mg Oral Daily Ji, Andrew, MD   50 mg at 10/17/23 1141    Lab Results:  Results for orders placed or performed during the hospital encounter of 10/15/23 (from the past 48 hours)  Glucose, capillary     Status: Abnormal   Collection Time: 10/16/23  5:18 PM  Result Value Ref Range   Glucose-Capillary 193 (H) 70 - 99 mg/dL    Comment: Glucose reference range applies only to samples taken after fasting for at least 8 hours.   Comment 1 Notify RN   Glucose, capillary     Status: Abnormal   Collection Time: 10/16/23  7:53 PM  Result Value Ref Range   Glucose-Capillary 226 (H) 70 - 99 mg/dL    Comment: Glucose reference range applies only to samples taken after fasting for at least 8 hours.  Glucose, capillary     Status: Abnormal   Collection Time: 10/17/23  5:49 AM  Result Value Ref Range   Glucose-Capillary 134 (H) 70 - 99 mg/dL    Comment: Glucose reference range applies only to samples taken after fasting  for at least 8 hours.   Comment 1 Notify RN    Comment 2 Document in Chart   Hemoglobin A1c     Status: Abnormal   Collection Time: 10/17/23  6:32 AM  Result Value Ref Range   Hgb A1c MFr Bld 6.7 (H) 4.8 - 5.6 %    Comment: (NOTE) Diagnosis of Diabetes The following HbA1c ranges recommended by the American Diabetes Association (ADA) may be used as an aid in the diagnosis of diabetes mellitus.  Hemoglobin             Suggested A1C NGSP%              Diagnosis  <5.7                   Non Diabetic  5.7-6.4                Pre-Diabetic  >6.4                   Diabetic  <7.0  Glycemic control for                       adults with diabetes.     Mean Plasma Glucose 145.59 mg/dL    Comment: Performed at [facility], [address] [place], [place] [address]  Lipid panel     Status: Abnormal   Collection Time: 10/17/23  6:32 AM  Result Value Ref Range   Cholesterol 138 0 - 200 mg/dL    Comment:        ATP III CLASSIFICATION:  <200     mg/dL   Desirable  799-760  mg/dL   Borderline High  >=759    mg/dL   High           Triglycerides 66 <150 mg/dL   HDL 30 (L) >59 mg/dL   Total CHOL/HDL Ratio 4.6 RATIO   VLDL 13 0 - 40 mg/dL   LDL Cholesterol 95 0 - 99 mg/dL    Comment:        Total Cholesterol/HDL:CHD Risk Coronary Heart Disease Risk Table                     Men   Women  1/2 Average Risk   3.4   3.3  Average Risk       5.0   4.4  2 X Average Risk   9.6   7.1  3 X Average Risk  23.4   11.0        Use the calculated Patient Ratio above and the CHD Risk Table to determine the patient's CHD Risk.        ATP III CLASSIFICATION (LDL):  <100     mg/dL   Optimal  899-870  mg/dL   Near or Above                    Optimal  130-159  mg/dL   Borderline  839-810  mg/dL   High  >809     mg/dL   Very High Performed at [facility], [address], [place], [place] [address]   Glucose, capillary     Status: Abnormal   Collection  Time: 10/17/23 11:47 AM  Result Value Ref Range   Glucose-Capillary 175 (H) 70 - 99 mg/dL    Comment: Glucose reference range applies only to samples taken after fasting for at least 8 hours.  Glucose, capillary     Status: Abnormal   Collection Time: 10/17/23  5:10 PM  Result Value Ref Range   Glucose-Capillary 199 (H) 70 - 99 mg/dL    Comment: Glucose reference range applies only to samples taken after fasting for at least 8 hours.  Glucose, capillary     Status: Abnormal   Collection Time: 10/17/23  9:10 PM  Result Value Ref Range   Glucose-Capillary 212 (H) 70 - 99 mg/dL    Comment: Glucose reference range applies only to samples taken after fasting for at least 8 hours.  Glucose, capillary     Status: Abnormal   Collection Time: 10/18/23  6:23 AM  Result Value Ref Range   Glucose-Capillary 132 (H) 70 - 99 mg/dL    Comment: Glucose reference range applies only to samples taken after fasting for at least 8 hours.   Comment 1 Document in Chart     Blood Alcohol level:  Lab Results  Component Value Date   [facility] <15 10/15/2023   ETH <10  09/03/2022    Metabolic Disorder Labs: Lab Results  Component Value Date   HGBA1C 6.7 (H) 10/17/2023   MPG 145.59 10/17/2023   MPG 162.81 09/04/2022   Lab Results  Component Value Date   PROLACTIN 31.3 (H) 05/23/2015   Lab Results  Component Value Date   CHOL 138 10/17/2023   TRIG 66 10/17/2023   HDL 30 (L) 10/17/2023   CHOLHDL 4.6 10/17/2023   VLDL 13 10/17/2023   LDLCALC 95 10/17/2023   LDLCALC 83 12/09/2020    Physical Findings: AIMS:  ,  ,  ,  ,  ,  ,   CIWA:  CIWA-Ar Total: 3 COWS:  COWS Total Score: 2  Musculoskeletal: Strength & Muscle Tone: within normal limits Gait & Station: normal Patient leans: N/A  Psychiatric Specialty Exam:  Presentation  General Appearance:  Disheveled  Eye Contact: None  Speech: Clear and Coherent  Speech Volume: Decreased  Handedness:No data recorded  Mood and Affect   Mood: Anxious; Labile  Affect: Labile   Thought Process  Thought Processes: Disorganized  Descriptions of Associations:Circumstantial  Orientation:Full (Time, Place and Person)  Thought Content:Rumination  History of Schizophrenia/Schizoaffective disorder:No data recorded Duration of Psychotic Symptoms:No data recorded Hallucinations:No data recorded  Ideas of Reference:None  Suicidal Thoughts:No data recorded  Homicidal Thoughts:No data recorded   Sensorium  Memory: Remote Fair  Judgment: Impaired  Insight: Poor   Executive Functions  Concentration: Poor  Attention Span: Poor  Recall: Fair  Fund of Knowledge: Fair  Language: Fair   Psychomotor Activity  Psychomotor Activity: No data recorded   Assets  Assets: Communication Skills; Desire for Improvement; Financial Resources/Insurance   Sleep  Sleep: No data recorded    Physical Exam: Physical Exam Vitals and nursing note reviewed.  Constitutional:      General: He is not in acute distress.    Appearance: He is not ill-appearing.  HENT:     Mouth/Throat:     Pharynx: Oropharynx is clear.     Comments: Complains of tooth pain. Cardiovascular:     Rate and Rhythm: Normal rate.     Pulses: Normal pulses.  Pulmonary:     Effort: No respiratory distress.  Skin:    General: Skin is dry.  Neurological:     Mental Status: He is alert and oriented to person, place, and time.    ROS Blood pressure (!) 129/101, pulse 77, temperature 98 F (36.7 C), temperature source Oral, resp. rate 16, height 5' 3 (1.6 m), weight 62.5 kg, SpO2 100%. Body mass index is 24.41 kg/m.   Treatment Plan Summary: Daily contact with patient to assess and evaluate symptoms and progress in treatment and Medication management    PLAN: Safety and Monitoring:             -- Voluntary admission to inpatient psychiatric unit for safety, stabilization and treatment             -- Daily contact with  patient to assess and evaluate symptoms and progress in treatment             -- Patient's case to be discussed in multi-disciplinary team meeting             -- Observation Level : q15 minute checks             -- Vital signs: q12 hours             -- Precautions: suicide, elopement, and assault   2. Psychiatric Problems #Opioid  use disorder, severe dependence #Substance-induced mood disorder #Historical diagnosis of ADHD - Clonidine  taper - COWS 11>5 - Suboxone  8-2 twice daily - Seroquel  100 mg nightly             -A1c 6.7               Lipid profile HDL 30, otherwise WNL - Sertraline  50 mg daily - Opiate withdrawal PRNs ordered including Zofran , Imodium , Bentyl , Maalox   -PRNs: maalox, milk of magnesia, hydroxyzine , trazodone  -- As needed agitation protocol in-place   The risks/benefits/side-effects/alternatives to the above medication were discussed in detail with the patient and time was given for questions. The patient consents to medication trial. FDA black box warnings, if present, were discussed.   The patient is agreeable with the medication plan, as above. We will monitor the patient's response to pharmacologic treatment, and adjust medications as necessary.   3. Medical Problems Type 2 diabetes mellitus -SSI sensitive with meals - A1c ordered   4. Routine and other pertinent labs: EKG monitoring: QTc: 484             -repeat EKG   Metabolism / endocrine: BMI: Body mass index is 24.41 kg/m. Prolactin: Recent Labs       Lab Results  Component Value Date    PROLACTIN 31.3 (H) 05/23/2015      Lipid Panel: Recent Labs       Lab Results  Component Value Date    CHOL 129 12/09/2020    TRIG 84 12/09/2020    HDL 29 (L) 12/09/2020    CHOLHDL 4.4 12/09/2020    VLDL 17 12/09/2020    LDLCALC 83 12/09/2020    LDLCALC 49 10/08/2020      HbgA1c: Last Labs     Hgb A1c MFr Bld (%)  Date Value  09/04/2022 7.3 (H)      TSH: Last Labs     TSH (uIU/mL)   Date Value  12/09/2020 0.390        5. Group Therapy:             -- Encouraged patient to participate in unit milieu and in scheduled group therapies              -- Short Term Goals: Ability to identify changes in lifestyle to reduce recurrence of condition, verbalize feelings, identify and develop effective coping behaviors, maintain clinical measurements within normal limits, and identify triggers associated with substance abuse/mental health issues will improve. Improvement in ability to demonstrate self-control and comply with prescribed medications.             -- Long Term Goals: Improvement in symptoms so as ready for discharge -- Patient is encouraged to participate in group therapy while admitted to the psychiatric unit. -- We will address other chronic and acute stressors, which contributed to the patient's Opioid use disorder, severe, dependence (HCC) in order to reduce the risk of self-harm at discharge.   6. Discharge Planning:              -- Social work and case management to assist with discharge planning and identification of hospital follow-up needs prior to discharge             -- Estimated LOS: 5-7 days             -- Discharge Concerns: Need to establish a safety plan; Medication compliance and effectiveness             -- Discharge Goals:  Return home with outpatient referrals for mental health follow-up including medication management/psychotherapy   I certify that inpatient services furnished can reasonably be expected to improve the patient's condition.   Blair Chiquita Hint, NP 10/18/2023, 7:21 AM
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
## 2023-10-17 NOTE — Group Note (Signed)
 Recreation Therapy Group Note   Group Topic:Stress Management  Group Date: 10/17/2023 Start Time: 0930 End Time: 0950 Facilitators: Shanelle Clontz-McCall, LRT,CTRS Location: [address] Dayroom   Group Topic: Stress Management   Goal Area(s) Addresses:  Patient will actively participate in stress management techniques presented during session.  Patient will successfully identify benefit of practicing stress management post d/c.   Behavioral Response:   Intervention: Relaxation exercise with ambient sound and script   Activity: Guided Imagery. LRT provided education, instruction, and demonstration on practice of visualization via guided imagery. Patient was asked to participate in the technique introduced during session. LRT debriefed including topics of mindfulness, stress management and specific scenarios each patient could use these techniques. Patients were given suggestions of ways to access scripts post d/c and encouraged to explore Youtube and other apps available on smartphones, tablets, and computers.  Education:  Stress Management, Discharge Planning.   Education Outcome: Acknowledges education   Affect/Mood: N/A   Participation Level: Did not attend    Clinical Observations/Individualized Feedback:      Plan: Continue to engage patient in RT group sessions 2-3x/week.   Breonna Gafford-McCall, LRT,CTRS 10/17/2023 12:20 PM

## 2023-10-17 NOTE — Progress Notes (Signed)
 Tour of Duty:  Prentice JINNY Angle, RN, 10/17/23, Tour of Duty: 0700-1900  SI/HI/AVH: Denies  Self-Reported   Mood: Negative  Anxiety: Denies, but Observable Depression: Denies, but Observable Irritability: Denies, but Observable  Broset  Violence Prevention Guidelines *See Row Information*: Small Violence Risk interventions implemented   LBM  Last BM Date : 10/17/23   Pain: present, interventions include: scheduled Rx  Patient Refusals (including Rx): No  >>Shift Summary: Patient observed to be isolate to self and room. Patient able to make needs known. Patient observed to engage appropriately with staff and peers, but no initiation. Patient taking medications as prescribed. This shift, no PRN medication requested or required. No reported or observed side effects to medication. No reported or observed agitation, aggression, or other acute emotional distress. No reported or observed physical abnormalities or concerns.  Last Vitals  Vitals Weight: 62.5 kg Temp: 98 F (36.7 C) Temp Source: Oral Pulse Rate: 61 Resp: 16 BP: (!) 130/92 Patient Position: (not recorded)  Admission Type  Psych Admission Type (Psych Patients Only) Admission Status: Voluntary Date 72 hour document signed : (not recorded) Time 72 hour document signed : (not recorded) Provider Notified (First and Last Name) (see details for LINK to note): (not recorded)   Psychosocial Assessment  Psychosocial Assessment Patient Complaints: Anxiety, Agitation, Substance abuse Eye Contact: Brief Facial Expression: Anxious, Sullen Affect: Anxious, Sullen Speech: Logical/coherent Interaction: Assertive Motor Activity: Restless Appearance/Hygiene: Unremarkable Behavior Characteristics: Cooperative, Guarded Mood: Anxious   Aggressive Behavior  Targets: (not recorded)   Thought Process  Thought Process Coherency: Within Defined Limits Content: Blaming self Delusions: None reported or observed Perception:  Within Defined Limits Hallucination: None reported or observed Judgment: Limited Confusion: None  Danger to Self/Others  Danger to Self Current suicidal ideation?: Denies Description of Suicide Plan: (not recorded) Self-Injurious Behavior: (not recorded) Agreement Not to Harm Self: (not recorded) Description of Agreement: (not recorded) Danger to Others: None reported or observed

## 2023-10-17 NOTE — [facility] Group Notes (Signed)
 [facility] Group Notes:  (Nursing/MHT/Case Management/Adjunct)  Date:  10/17/2023  Time:  8:42 PM  Type of Therapy:  Wrap-up group  Participation Level:  Active  Participation Quality:  Appropriate  Affect:  Appropriate  Cognitive:  Appropriate  Insight:  Appropriate  Engagement in Group:  Engaged  Modes of Intervention:  Education  Summary of Progress/Problems: PT reports no goal. Rated his day 4/10.  Grayce LITTIE Essex 10/17/2023, 8:42 PM

## 2023-10-17 NOTE — Group Note (Signed)
 Date:  10/17/2023 Time:  10:00 AM  Group Topic/Focus:  Goals Group:   The focus of this group is to help patients establish daily goals to achieve during treatment and discuss how the patient can incorporate goal setting into their daily lives to aide in recovery.    Participation Level:  Did Not Attend   Aleshia Cartelli R Kambri Dismore 10/17/2023, 10:00 AM

## 2023-10-17 NOTE — Plan of Care (Signed)
  Problem: Education: Goal: Emotional status will improve 10/17/2023 1756 by Ethyl Prentice PARAS, RN Outcome: Not Progressing 10/17/2023 1417 by Ethyl Prentice PARAS, RN Outcome: Progressing Goal: Mental status will improve 10/17/2023 1756 by Ethyl Prentice PARAS, RN Outcome: Not Progressing 10/17/2023 1417 by Ethyl Prentice PARAS, RN Outcome: Progressing

## 2023-10-17 NOTE — [facility] Suicide Risk Assessment (Signed)
 [facility] INPATIENT:  Family/Significant Other Suicide Prevention Education  Suicide Prevention Education:  Patient Refusal for Family/Significant Other Suicide Prevention Education: The patient Joshua Thomas has refused to provide written consent for family/significant other to be provided Family/Significant Other Suicide Prevention Education during admission and/or prior to discharge.  Physician notified.  Benjaman Donia SAUNDERS 10/17/2023, 3:27 PM

## 2023-10-17 NOTE — Group Note (Signed)
 Date:  10/17/2023 Time:  3:09 PM  Group Topic/Focus:  Emotional Education:   The focus of this group is to discuss what feelings/emotions are, and how they are experienced. Physical Wellness Education: To educate participants on the principles of physical wellness, promote healthy lifestyle habits, and encourage regular physical activity to improve overall physical health, energy levels, and quality of life.   Participation Level:  Did Not Attend   Jinelle Butchko R Marquiz Sotelo 10/17/2023, 3:09 PM

## 2023-10-17 NOTE — Progress Notes (Signed)
(  Sleep Hours) -7.25 (Any PRNs that were needed, meds refused, or side effects to meds)- vistaril , naproxen , (Any disturbances and when (visitation, over night)-none (Concerns raised by the patient)- none (SI/HI/AVH)-denies

## 2023-10-17 NOTE — Plan of Care (Signed)
   Problem: Education: Goal: Emotional status will improve Outcome: Progressing Goal: Mental status will improve Outcome: Progressing

## 2023-10-17 NOTE — Progress Notes (Signed)
   10/17/23 2200  Psych Admission Type (Psych Patients Only)  Admission Status Voluntary  Psychosocial Assessment  Patient Complaints Substance abuse;Anxiety  Eye Contact Brief  Facial Expression Anxious  Affect Anxious  Speech Logical/coherent  Interaction Assertive  Motor Activity Restless  Appearance/Hygiene Unremarkable  Behavior Characteristics Appropriate to situation  Mood Anxious  Thought Process  Coherency WDL  Content Blaming self  Delusions None reported or observed  Perception WDL  Hallucination None reported or observed  Judgment Limited  Confusion None  Danger to Self  Current suicidal ideation? Denies  Danger to Others  Danger to Others None reported or observed

## 2023-10-18 LAB — GLUCOSE, CAPILLARY
Glucose-Capillary: 132 mg/dL — ABNORMAL HIGH (ref 70–99)
Glucose-Capillary: 154 mg/dL — ABNORMAL HIGH (ref 70–99)
Glucose-Capillary: 215 mg/dL — ABNORMAL HIGH (ref 70–99)
Glucose-Capillary: 177 mg/dL — ABNORMAL HIGH (ref 70–99)

## 2023-10-18 MED ORDER — SENNOSIDES-DOCUSATE SODIUM 8.6-50 MG PO TABS
1.0000 | ORAL_TABLET | Freq: Two times a day (BID) | ORAL | Status: DC
  Administered 2023-10-19 – 2023-10-25 (×13): 1 via ORAL
  Filled 2023-10-18 (×13): qty 1

## 2023-10-18 MED ORDER — POLYETHYLENE GLYCOL 3350 17 G PO PACK
17.0000 g | PACK | Freq: Every day | ORAL | Status: DC
  Administered 2023-10-18 – 2023-10-22 (×5): 17 g via ORAL
  Filled 2023-10-18 (×8): qty 1

## 2023-10-18 NOTE — Plan of Care (Signed)
   Problem: Education: Goal: Emotional status will improve Outcome: Progressing Goal: Mental status will improve Outcome: Progressing

## 2023-10-18 NOTE — Group Note (Signed)
 Date:  10/18/2023 Time:  5:28 PM  Group Topic/Focus:  Dimensions of Wellness:   The focus of this group is to introduce the topic of wellness and discuss the role each dimension of wellness plays in total health.    Participation Level:  Did Not Attend   Annalee Larch 10/18/2023, 5:28 PM

## 2023-10-18 NOTE — Plan of Care (Signed)
   Problem: Education: Goal: Emotional status will improve Outcome: Progressing Goal: Mental status will improve Outcome: Progressing   Problem: Activity: Goal: Interest or engagement in activities will improve Outcome: Progressing

## 2023-10-18 NOTE — Group Note (Signed)
 LCSW Group Therapy Note   Group Date: 10/18/2023 Start Time: 1100 End Time: 1200   Participation:  did not attend  Type of Therapy:  Group Therapy  Topic:  Stress Less:  Nurturing Your Mind and Body Through Calm    Objective:  Learn techniques for managing stress through body relaxation, mindfulness, and self-compassion.  Goals: Use body relaxation techniques, such as Box Breathing and Progressive Muscle Relaxation, to reduce physical tension. Practice mindfulness to break the cycle of overthinking and mental chatter. Embrace self-compassion to handle stress with kindness and resilience.  Summary:  Today's session focused on calming the body with relaxation techniques, breaking the cycle of stress with mindfulness, and using self-compassion to manage challenges more gracefully. These tools help reduce stress and foster a balanced, peaceful mindset.  Therapeutic Modalities used:  Elements of CBT ( cognitive restructuring)  Elements of DBT (box breathing, progressive body relaxation, mindfulness, acceptance)    Joshua Thomas O Isela Stantz, LCSWA 10/18/2023  4:56 PM

## 2023-10-18 NOTE — Plan of Care (Signed)
   Problem: Education: Goal: Knowledge of [place] [facility] Education information/materials will improve Outcome: Progressing Goal: Emotional status will improve Outcome: Progressing Goal: Mental status will improve Outcome: Progressing

## 2023-10-18 NOTE — Group Note (Signed)
 Date:  10/18/2023 Time:  1:44 PM  Group Topic/Focus:  Recovery Goals:   The focus of this group is to identify appropriate goals for recovery and establish a plan to achieve them. [employer]: To understand individualized and supportive care plans that promote mental health recovery, enhance coping skills, and improve overall well-being, while fostering a safe and inclusive environment for all participants.   Participation Level:  Did Not Attend   Joshua Thomas 10/18/2023, 1:44 PM

## 2023-10-18 NOTE — Progress Notes (Signed)
(  Sleep Hours) - 14.75 (Any PRNs that were needed, meds refused, or side effects to meds)- atarax  50mg , naproxen  500mg , and robaxin  500 mg, no meds refused, no side effects to meds  (Any disturbances and when (visitation, over night)- n/a  (Concerns raised by the patient)- n/a  (SI/HI/AVH)- denies

## 2023-10-18 NOTE — Progress Notes (Signed)
 Tour of Duty:  Prentice JINNY Angle, RN, 10/18/23, Tour of Duty: 0700-1900  SI/HI/AVH: Denies  Self-Reported   Mood: Neutral  Anxiety: Denies, but Observable Depression: Denies Irritability: Denies  Broset  Violence Prevention Guidelines *See Row Information*: Small Violence Risk interventions implemented   LBM  Last BM Date : 10/17/23   Pain: present, PRN provided (see MAR)  Patient Refusals (including Rx): No  >>Shift Summary: Patient observed to be calm on unit. Patient able to make needs known. Patient reports pain at tooth loss site, observable edema to sinus bridge on L side of face, LP informed. Patient observed to engage appropriately with staff and peers. Patient taking medications as prescribed. This shift, no PRN medication requested or required. No reported or observed side effects to medication. No reported or observed agitation, aggression, or other acute emotional distress. Reports mild constipation, but reports daily full BM. No additional reported or observed physical abnormalities or concerns.  Last Vitals  Vitals Weight: 62.5 kg Temp: 98 F (36.7 C) Temp Source: Oral Pulse Rate: 72 Resp: 16 BP: 119/80 Patient Position: (not recorded)  Admission Type  Psych Admission Type (Psych Patients Only) Admission Status: Involuntary Date 72 hour document signed : (not recorded) Time 72 hour document signed : (not recorded) Provider Notified (First and Last Name) (see details for LINK to note): (not recorded)   Psychosocial Assessment  Psychosocial Assessment Patient Complaints: None Eye Contact: Brief Facial Expression: Anxious Affect: Anxious Speech: Logical/coherent Interaction: Assertive Motor Activity: Restless Appearance/Hygiene: Unremarkable Behavior Characteristics: Cooperative, Anxious Mood: Anxious   Aggressive Behavior  Targets: (not recorded)   Thought Process  Thought Process Coherency: Within Defined Limits Content: Blaming  self Delusions: None reported or observed Perception: Within Defined Limits Hallucination: None reported or observed Judgment: Limited Confusion: None  Danger to Self/Others  Danger to Self Current suicidal ideation?: Denies Description of Suicide Plan: (not recorded) Self-Injurious Behavior: (not recorded) Agreement Not to Harm Self: (not recorded) Description of Agreement: (not recorded) Danger to Others: None reported or observed

## 2023-10-18 NOTE — Progress Notes (Signed)
 [facility] MD Progress Note  10/18/2023 3:18 PM Joshua Thomas  MRN:  [id number]  Reason for admission: Joshua Thomas is a 48 y.o., male with history of opioid use disorder, substance-induced mood disorder, stimulant use disorder-amphetamine type, homelessness, COPD, hepatitis C, hypertension, diabetes, IV drug use complicated by osteomyelitis presents voluntarily to behavioral health hospital due to suicidal ideation with plan to overdose on fentanyl .   Principal Problem: Opioid use disorder, severe, dependence (HCC) Diagnosis: Principal Problem:   Opioid use disorder, severe, dependence (HCC) Active Problems:   GERD (gastroesophageal reflux disease)   Essential hypertension   MDD (major depressive disorder), recurrent severe, without psychosis (HCC)   Substance induced mood disorder (HCC)  Total Time spent with patient: 45 minutes    24-hour Chart Review: Chart reviewed. Patient's case discussed in interdisciplinary team meeting.  Vital signs reviewed without critical value. As needed hydroxyzine  required overnight.  He slept a documented 7.25 hours.  He is adherent to taking psychotropic medication regimen. Nursing notes indicate no behavioral challenges on the unit.   Today's Assessment Notes: On assessment today, Joshua Thomas reports his mood as "it's going." He endorses oral pain and reports that his withdrawal symptoms are improving. He reports experiencing mild cravings "here and there." He acknowledges passive thoughts of death without intent or plan. Appetite has improved, and he states that his sleep has been effective with medication. He reports low energy and states that his concentration has been affected since he has not been taking Adderall during his admission. Joshua Thomas states he has not yet attended groups or unit activities but plans to participate starting tomorrow. He expressed possible interest in inpatient substance use treatment and intends to explore options. He also requests a referral for  outpatient therapy following discharge.   10/6: Patient appears mildly withdrawn but is less malaised today. He continues on the COWS opioid withdrawal protocol in detox and is tolerating his psychiatric medications well without reported side effects. Nursing reports that the patient lost a tooth today. No acute safety concerns noted at this time; passive thoughts of death remain without intent or plan. Blood sugars have ranged from 134 to 226. Continue current psychotropic medications, no changes indicated today.     Past Psychiatric History:  Previous psych diagnoses: ADHD, MDD, PTSD, OUD, stimulant use disorder, alchol use disorder Prior inpatient psychiatric treatment: several Prior outpatient psychiatric treatment: endorses Current psychiatric provider: Heron Thomas Current therapist:denies   History of suicide attempts: endorses History of homicide: endorses   Past Psychotropics:    Substance Use History: Alcohol: drank extensively in the past, in remission Tobacco: daily Illicit Substance: fentanyl , cocaine, methamphetamine   Past Medical History:  Past Medical History:  Diagnosis Date   Acute on chronic osteomyelitis (HCC) 07/04/2021   Acute osteomyelitis of lumbar spine (HCC) 05/17/2017   ADHD (attention deficit hyperactivity disorder)    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   Bipolar 1 disorder (HCC)    Chronic back pain    Chronic pain    chronic l leg pain   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   COPD (chronic obstructive pulmonary disease) (HCC)    DDD (degenerative disc disease), lumbar    Degenerative disc disease, lumbar    Depression    Diabetes mellitus without complication (HCC)    Discitis    Epidural abscess 10/08/2021   GERD (gastroesophageal reflux disease)    Hepatitis C    Hepatitis C    History of kidney stones  History of stomach ulcers    Hx of diabetes mellitus    due to infection   Hypertension    Lung  nodules    3 on L and 2 on R   Osteomyelitis of lumbar spine (HCC) 10/13/2022   Polysubstance abuse (HCC) 09/03/2022   PTSD (post-traumatic stress disorder)    Sciatica    Substance abuse [facility])     Past Surgical History:  Procedure Laterality Date   FRACTURE SURGERY     HARDWARE REMOVAL  01/27/2012   Procedure: HARDWARE REMOVAL;  Surgeon: Lonni CINDERELLA Poli, MD;  Location: [facility];  Service: Orthopedics;  Laterality: Left;   IR LUMBAR DISC ASPIRATION W/IMG GUIDE  06/08/2017   IR LUMBAR DISC ASPIRATION W/IMG GUIDE  07/06/2021   LAPAROSCOPIC LYSIS OF ADHESIONS  05/24/2022   Procedure: LAPAROSCOPIC LYSIS OF ADHESIONS;  Surgeon: Mavis Anes, MD;  Location: [facility];  Service: General;;   RADIOLOGY WITH ANESTHESIA N/A 06/08/2017   Procedure: DISC ASPIRATION;  Surgeon: Dolphus Carrion, MD;  Location: [facility] OR;  Service: Radiology;  Laterality: N/A;   TIBIA IM NAIL INSERTION  01/27/2012   Procedure: INTRAMEDULLARY (IM) NAIL TIBIAL;  Surgeon: Lonni CINDERELLA Poli, MD;  Location: [facility];  Service: Orthopedics;  Laterality: Left;  Removal of IM Rod and Screws Left Tibia with Exchange Nail, Allograft Bone Graft Left Tibia   XI ROBOTIC ASSISTED INGUINAL HERNIA REPAIR WITH MESH Right 05/24/2022   Procedure: XI ROBOTIC ASSISTED INGUINAL HERNIA REPAIR WITH MESH;  Surgeon: Mavis Anes, MD;  Location: [facility];  Service: General;  Laterality: Right;   Family History:  Family History  Problem Relation Age of Onset   Diabetes Mother    Alcohol abuse Father    Cirrhosis Father    Aneurysm Father    Family Psychiatric  History: As listed above Social History:  Social History   Substance and Sexual Activity  Alcohol Use Not Currently   Alcohol/week: 1.0 standard drink of alcohol   Types: 1 Standard drinks or equivalent per week   Comment: last used July 2023     Social History   Substance and Sexual Activity  Drug Use Not Currently   Types: IV, Fentanyl , Benzodiazepines   Comment: last used  July 2023    Social History   Socioeconomic History   Marital status: Single    Spouse name: Not on file   Number of children: Not on file   Years of education: 12   Highest education level: High school graduate  Occupational History   Not on file  Tobacco Use   Smoking status: Every Day    Current packs/day: 1.50    Average packs/day: 1.5 packs/day for 33.0 years (49.5 ttl pk-yrs)    Types: Cigarettes   Smokeless tobacco: Former    Quit date: 08/08/2006   Tobacco comments:    1 PPD  Vaping Use   Vaping status: Every Day  Substance and Sexual Activity   Alcohol use: Not Currently    Alcohol/week: 1.0 standard drink of alcohol    Types: 1 Standard drinks or equivalent per week    Comment: last used July 2023   Drug use: Not Currently    Types: IV, Fentanyl , Benzodiazepines    Comment: last used July 2023   Sexual activity: Not Currently  Other Topics Concern   Not on file  Social History Narrative   Not on file   Social Drivers of Health   Financial Resource Strain: High Risk (09/23/2017)   Overall  Financial Resource Strain (CARDIA)    Difficulty of Paying Living Expenses: Very hard  Food Insecurity: Food Insecurity Present (10/15/2023)   Hunger Vital Sign    Worried About Running Out of Food in the Last Year: Sometimes true    Ran Out of Food in the Last Year: Sometimes true  Transportation Needs: Unmet Transportation Needs (10/15/2023)   PRAPARE - Administrator, Civil Service (Medical): Yes    Lack of Transportation (Non-Medical): Yes  Physical Activity: Unknown (09/23/2017)   Exercise Vital Sign    Days of Exercise per Week: Patient declined    Minutes of Exercise per Session: Patient declined  Stress: Stress Concern Present (09/23/2017)   [employer] of Occupational Health - Occupational Stress Questionnaire    Feeling of Stress : Very much  Social Connections: Socially Isolated (09/23/2017)   Social Connection and Isolation Panel    Frequency  of Communication with Friends and Family: Once a week    Frequency of Social Gatherings with Friends and Family: Once a week    Attends Religious Services: Never    Database administrator or Organizations: No    Attends Engineer, structural: Never    Marital Status: Never married   Additional Social History:                           Current Medications: Current Facility-Administered Medications  Medication Dose Route Frequency Provider Last Rate Last Admin   alum & mag hydroxide-simeth (MAALOX/MYLANTA) 200-200-20 MG/5ML suspension 30 mL  30 mL Oral Q4H PRN Kline, Kyra A, NP       benzocaine (ORAJEL) 10 % mucosal gel   Mouth/Throat QID PRN Blair Robin H, NP   Given at 10/18/23 9076   buprenorphine -naloxone  (SUBOXONE ) 8-2 mg per SL tablet 1 tablet  1 tablet Sublingual Q12H Lynnette Barter, MD   1 tablet at 10/18/23 9076   cloNIDine  (CATAPRES ) tablet 0.1 mg  0.1 mg Oral BID Kline, Kyra A, NP   0.1 mg at 10/18/23 9076   Followed by   NOREEN ON 10/20/2023] cloNIDine  (CATAPRES ) tablet 0.1 mg  0.1 mg Oral Daily Kline, Kyra A, NP       dicyclomine  (BENTYL ) tablet 20 mg  20 mg Oral Q6H PRN Kline, Kyra A, NP       haloperidol  (HALDOL ) tablet 5 mg  5 mg Oral TID PRN Kline, Kyra A, NP       And   diphenhydrAMINE  (BENADRYL ) capsule 50 mg  50 mg Oral TID PRN Kline, Kyra A, NP       haloperidol  lactate (HALDOL ) injection 5 mg  5 mg Intramuscular TID PRN Kline, Kyra A, NP   5 mg at 10/15/23 2333   And   diphenhydrAMINE  (BENADRYL ) injection 50 mg  50 mg Intramuscular TID PRN Kline, Kyra A, NP       And   LORazepam  (ATIVAN ) injection 2 mg  2 mg Intramuscular TID PRN Kline, Kyra A, NP       haloperidol  lactate (HALDOL ) injection 10 mg  10 mg Intramuscular TID PRN Kline, Kyra A, NP       And   diphenhydrAMINE  (BENADRYL ) injection 50 mg  50 mg Intramuscular TID PRN Kline, Kyra A, NP   50 mg at 10/15/23 2333   And   LORazepam  (ATIVAN ) injection 2 mg  2 mg Intramuscular TID PRN Kline,  Kyra A, NP   2 mg at 10/15/23 2333  hydrOXYzine  (ATARAX ) tablet 50 mg  50 mg Oral BID PRN Kline, Kyra A, NP   50 mg at 10/17/23 2103   insulin  aspart (novoLOG ) injection 0-9 Units  0-9 Units Subcutaneous TID WC Lynnette Barter, MD   2 Units at 10/17/23 1723   loperamide  (IMODIUM ) capsule 2-4 mg  2-4 mg Oral PRN Kline, Kyra A, NP       metFORMIN  (GLUCOPHAGE ) tablet 500 mg  500 mg Oral BID WC Kline, Kyra A, NP   500 mg at 10/18/23 0923   methocarbamol  (ROBAXIN ) tablet 500 mg  500 mg Oral Q8H PRN Kline, Kyra A, NP   500 mg at 10/17/23 2103   naproxen  (NAPROSYN ) tablet 500 mg  500 mg Oral BID PRN Kline, Kyra A, NP   500 mg at 10/17/23 2103   nicotine  (NICODERM CQ  - dosed in mg/24 hours) patch 14 mg  14 mg Transdermal Daily Prentis Kitchens A, DO   14 mg at 10/18/23 9077   ondansetron  (ZOFRAN -ODT) disintegrating tablet 4 mg  4 mg Oral Q6H PRN Kline, Kyra A, NP   4 mg at 10/16/23 9097   QUEtiapine  (SEROQUEL ) tablet 100 mg  100 mg Oral QHS Kline, Kyra A, NP   100 mg at 10/17/23 2103   sertraline  (ZOLOFT ) tablet 50 mg  50 mg Oral Daily Lynnette Barter, MD   50 mg at 10/18/23 9076    Lab Results:  Results for orders placed or performed during the hospital encounter of 10/15/23 (from the past 48 hours)  Glucose, capillary     Status: Abnormal   Collection Time: 10/16/23  5:18 PM  Result Value Ref Range   Glucose-Capillary 193 (H) 70 - 99 mg/dL    Comment: Glucose reference range applies only to samples taken after fasting for at least 8 hours.   Comment 1 Notify RN   Glucose, capillary     Status: Abnormal   Collection Time: 10/16/23  7:53 PM  Result Value Ref Range   Glucose-Capillary 226 (H) 70 - 99 mg/dL    Comment: Glucose reference range applies only to samples taken after fasting for at least 8 hours.  Glucose, capillary     Status: Abnormal   Collection Time: 10/17/23  5:49 AM  Result Value Ref Range   Glucose-Capillary 134 (H) 70 - 99 mg/dL    Comment: Glucose reference range applies only to samples  taken after fasting for at least 8 hours.   Comment 1 Notify RN    Comment 2 Document in Chart   Hemoglobin A1c     Status: Abnormal   Collection Time: 10/17/23  6:32 AM  Result Value Ref Range   Hgb A1c MFr Bld 6.7 (H) 4.8 - 5.6 %    Comment: (NOTE) Diagnosis of Diabetes The following HbA1c ranges recommended by the American Diabetes Association (ADA) may be used as an aid in the diagnosis of diabetes mellitus.  Hemoglobin             Suggested A1C NGSP%              Diagnosis  <5.7                   Non Diabetic  5.7-6.4                Pre-Diabetic  >6.4                   Diabetic  <7.0  Glycemic control for                       adults with diabetes.     Mean Plasma Glucose 145.59 mg/dL    Comment: Performed at [facility], [address], [place], [place] [address]  Lipid panel     Status: Abnormal   Collection Time: 10/17/23  6:32 AM  Result Value Ref Range   Cholesterol 138 0 - 200 mg/dL    Comment:        ATP III CLASSIFICATION:  <200     mg/dL   Desirable  799-760  mg/dL   Borderline High  >=759    mg/dL   High           Triglycerides 66 <150 mg/dL   HDL 30 (L) >59 mg/dL   Total CHOL/HDL Ratio 4.6 RATIO   VLDL 13 0 - 40 mg/dL   LDL Cholesterol 95 0 - 99 mg/dL    Comment:        Total Cholesterol/HDL:CHD Risk Coronary Heart Disease Risk Table                     Men   Women  1/2 Average Risk   3.4   3.3  Average Risk       5.0   4.4  2 X Average Risk   9.6   7.1  3 X Average Risk  23.4   11.0        Use the calculated Patient Ratio above and the CHD Risk Table to determine the patient's CHD Risk.        ATP III CLASSIFICATION (LDL):  <100     mg/dL   Optimal  899-870  mg/dL   Near or Above                    Optimal  130-159  mg/dL   Borderline  839-810  mg/dL   High  >809     mg/dL   Very High Performed at [facility], [address] [place], [place] [address]   Glucose, capillary     Status:  Abnormal   Collection Time: 10/17/23 11:47 AM  Result Value Ref Range   Glucose-Capillary 175 (H) 70 - 99 mg/dL    Comment: Glucose reference range applies only to samples taken after fasting for at least 8 hours.  Glucose, capillary     Status: Abnormal   Collection Time: 10/17/23  5:10 PM  Result Value Ref Range   Glucose-Capillary 199 (H) 70 - 99 mg/dL    Comment: Glucose reference range applies only to samples taken after fasting for at least 8 hours.  Glucose, capillary     Status: Abnormal   Collection Time: 10/17/23  9:10 PM  Result Value Ref Range   Glucose-Capillary 212 (H) 70 - 99 mg/dL    Comment: Glucose reference range applies only to samples taken after fasting for at least 8 hours.  Glucose, capillary     Status: Abnormal   Collection Time: 10/18/23  6:23 AM  Result Value Ref Range   Glucose-Capillary 132 (H) 70 - 99 mg/dL    Comment: Glucose reference range applies only to samples taken after fasting for at least 8 hours.   Comment 1 Document in Chart   Glucose, capillary     Status: Abnormal   Collection Time: 10/18/23 11:56 AM  Result Value Ref Range  Glucose-Capillary 154 (H) 70 - 99 mg/dL    Comment: Glucose reference range applies only to samples taken after fasting for at least 8 hours.    Blood Alcohol level:  Lab Results  Component Value Date   [facility] <15 10/15/2023   ETH <10 09/03/2022    Metabolic Disorder Labs: Lab Results  Component Value Date   HGBA1C 6.7 (H) 10/17/2023   MPG 145.59 10/17/2023   MPG 162.81 09/04/2022   Lab Results  Component Value Date   PROLACTIN 31.3 (H) 05/23/2015   Lab Results  Component Value Date   CHOL 138 10/17/2023   TRIG 66 10/17/2023   HDL 30 (L) 10/17/2023   CHOLHDL 4.6 10/17/2023   VLDL 13 10/17/2023   LDLCALC 95 10/17/2023   LDLCALC 83 12/09/2020    Physical Findings: AIMS:  ,  ,  ,  ,  ,  ,   CIWA:  CIWA-Ar Total: 3 COWS:  COWS Total Score: 2  Musculoskeletal: Strength & Muscle Tone: within  normal limits Gait & Station: normal Patient leans: N/A  Psychiatric Specialty Exam:  Presentation  General Appearance:  Disheveled  Eye Contact: None  Speech: Clear and Coherent  Speech Volume: Decreased  Handedness:No data recorded  Mood and Affect  Mood: Anxious; Labile  Affect: Labile   Thought Process  Thought Processes: Disorganized  Descriptions of Associations:Circumstantial  Orientation:Full (Time, Place and Person)  Thought Content:Rumination  History of Schizophrenia/Schizoaffective disorder:No data recorded Duration of Psychotic Symptoms:No data recorded Hallucinations:No data recorded  Ideas of Reference:None  Suicidal Thoughts:No data recorded  Homicidal Thoughts:No data recorded   Sensorium  Memory: Remote Fair  Judgment: Impaired  Insight: Poor   Executive Functions  Concentration: Poor  Attention Span: Poor  Recall: Fair  Fund of Knowledge: Fair  Language: Fair   Psychomotor Activity  Psychomotor Activity: No data recorded   Assets  Assets: Communication Skills; Desire for Improvement; Financial Resources/Insurance   Sleep  Sleep: No data recorded    Physical Exam: Physical Exam Vitals and nursing note reviewed.  Constitutional:      General: He is not in acute distress.    Appearance: He is not ill-appearing.  HENT:     Mouth/Throat:     Pharynx: Oropharynx is clear.     Comments: Complains of tooth pain. Cardiovascular:     Rate and Rhythm: Normal rate.     Pulses: Normal pulses.  Pulmonary:     Effort: No respiratory distress.  Skin:    General: Skin is dry.  Neurological:     Mental Status: He is alert and oriented to person, place, and time.    ROS Blood pressure (!) 129/101, pulse 77, temperature 98 F (36.7 C), temperature source Oral, resp. rate 16, height 5' 3 (1.6 m), weight 62.5 kg, SpO2 100%. Body mass index is 24.41 kg/m.   Treatment Plan Summary: Daily contact with  patient to assess and evaluate symptoms and progress in treatment and Medication management    PLAN: Safety and Monitoring:             -- Voluntary admission to inpatient psychiatric unit for safety, stabilization and treatment             -- Daily contact with patient to assess and evaluate symptoms and progress in treatment             -- Patient's case to be discussed in multi-disciplinary team meeting             --  Observation Level : q15 minute checks             -- Vital signs: q12 hours             -- Precautions: suicide, elopement, and assault   2. Psychiatric Problems #Opioid use disorder, severe dependence #Substance-induced mood disorder #Historical diagnosis of ADHD - Clonidine  taper - COWS 11>5 - Suboxone  8-2 twice daily - Seroquel  100 mg nightly             -A1c 6.7               Lipid profile HDL 30, otherwise WNL - Sertraline  50 mg daily - Opiate withdrawal PRNs ordered including Zofran , Imodium , Bentyl , Maalox   -PRNs: maalox, milk of magnesia, hydroxyzine , trazodone  -- As needed agitation protocol in-place   The risks/benefits/side-effects/alternatives to the above medication were discussed in detail with the patient and time was given for questions. The patient consents to medication trial. FDA black box warnings, if present, were discussed.   The patient is agreeable with the medication plan, as above. We will monitor the patient's response to pharmacologic treatment, and adjust medications as necessary.   3. Medical Problems Type 2 diabetes mellitus -SSI sensitive with meals - A1c ordered   4. Routine and other pertinent labs: EKG monitoring: QTc: 484             -repeat EKG   Metabolism / endocrine: BMI: Body mass index is 24.41 kg/m. Prolactin: Recent Labs       Lab Results  Component Value Date    PROLACTIN 31.3 (H) 05/23/2015      Lipid Panel: Recent Labs       Lab Results  Component Value Date    CHOL 129 12/09/2020    TRIG 84  12/09/2020    HDL 29 (L) 12/09/2020    CHOLHDL 4.4 12/09/2020    VLDL 17 12/09/2020    LDLCALC 83 12/09/2020    LDLCALC 49 10/08/2020      HbgA1c: Last Labs     Hgb A1c MFr Bld (%)  Date Value  09/04/2022 7.3 (H)      TSH: Last Labs     TSH (uIU/mL)  Date Value  12/09/2020 0.390        5. Group Therapy:             -- Encouraged patient to participate in unit milieu and in scheduled group therapies              -- Short Term Goals: Ability to identify changes in lifestyle to reduce recurrence of condition, verbalize feelings, identify and develop effective coping behaviors, maintain clinical measurements within normal limits, and identify triggers associated with substance abuse/mental health issues will improve. Improvement in ability to demonstrate self-control and comply with prescribed medications.             -- Long Term Goals: Improvement in symptoms so as ready for discharge -- Patient is encouraged to participate in group therapy while admitted to the psychiatric unit. -- We will address other chronic and acute stressors, which contributed to the patient's Opioid use disorder, severe, dependence (HCC) in order to reduce the risk of self-harm at discharge.   6. Discharge Planning:              -- Social work and case management to assist with discharge planning and identification of hospital follow-up needs prior to discharge             --  Estimated LOS: 5-7 days             -- Discharge Concerns: Need to establish a safety plan; Medication compliance and effectiveness             -- Discharge Goals: Return home with outpatient referrals for mental health follow-up including medication management/psychotherapy   I certify that inpatient services furnished can reasonably be expected to improve the patient's condition.   Blair Chiquita Hint, NP 10/18/2023, 3:18 PM Patient ID: Joshua Thomas, male   DOB: [birth year], 48 y.o.   MRN: [id number]

## 2023-10-18 NOTE — Group Note (Signed)
 Recreation Therapy Group Note   Group Topic:Animal Assisted Therapy   Group Date: 10/18/2023 Start Time: 9050 End Time: 1030 Facilitators: Brycen Bean-McCall, LRT,CTRS Location: [address] Dayroom   Animal-Assisted Activity (AAA) Program Checklist/Progress Notes Patient Eligibility Criteria Checklist & Daily Group note for Rec Tx Intervention  AAA/T Program Assumption of Risk Form signed by Patient/ or Parent Legal Guardian Yes  Patient understands his/her participation is voluntary Yes  Behavioral Response:    Education: Charity fundraiser, Appropriate Animal Interaction   Education Outcome: Acknowledges education.    Affect/Mood: N/A   Participation Level: Did not attend    Clinical Observations/Individualized Feedback:     Plan: Continue to engage patient in RT group sessions 2-3x/week.   Divina Neale-McCall, LRT,CTRS 10/18/2023 12:45 PM

## 2023-10-18 NOTE — Group Note (Signed)
 Date:  10/18/2023 Time:  9:59 AM  Group Topic/Focus:  Goals Group:   The focus of this group is to help patients establish daily goals to achieve during treatment and discuss how the patient can incorporate goal setting into their daily lives to aide in recovery.    Participation Level:  Did Not Attend   Joshua Thomas 10/18/2023, 9:59 AM

## 2023-10-19 LAB — GLUCOSE, CAPILLARY
Glucose-Capillary: 172 mg/dL — ABNORMAL HIGH (ref 70–99)
Glucose-Capillary: 165 mg/dL — ABNORMAL HIGH (ref 70–99)
Glucose-Capillary: 106 mg/dL — ABNORMAL HIGH (ref 70–99)
Glucose-Capillary: 158 mg/dL — ABNORMAL HIGH (ref 70–99)

## 2023-10-19 MED ORDER — CHLORHEXIDINE GLUCONATE 0.12 % MT SOLN
15.0000 mL | Freq: Two times a day (BID) | OROMUCOSAL | Status: DC
  Administered 2023-10-19 – 2023-10-25 (×12): 15 mL via OROMUCOSAL
  Filled 2023-10-19 (×17): qty 15

## 2023-10-19 MED ORDER — AMOXICILLIN-POT CLAVULANATE 875-125 MG PO TABS
1.0000 | ORAL_TABLET | Freq: Two times a day (BID) | ORAL | Status: DC
  Administered 2023-10-19 – 2023-10-25 (×13): 1 via ORAL
  Filled 2023-10-19 (×13): qty 1

## 2023-10-19 NOTE — Progress Notes (Signed)

## 2023-10-19 NOTE — Plan of Care (Signed)
  Problem: Education: Goal: Knowledge of [facility] Education information/materials will improve Outcome: Completed/Met Goal: Mental status will improve Outcome: Progressing Goal: Verbalization of understanding the information provided will improve Outcome: Progressing   Problem: Activity: Goal: Interest or engagement in activities will improve Outcome: Progressing

## 2023-10-19 NOTE — Group Note (Signed)
 Recreation Therapy Group Note   Group Topic:Leisure Education  Group Date: 10/19/2023 Start Time: 0931 End Time: 1015 Facilitators: Estefanie Cornforth-McCall, LRT,CTRS Location: [address] Day[address]   Group Topic: Leisure Education  Goal Area(s) Addresses:  Patient will identify positive leisure activities for use post discharge. Patient will identify at least one positive benefit of the program they create. Patient will work effectively work with peer by sharing ideas and contributing to Music therapist.  Behavioral Response:    Intervention: Innovation, Group Presentation   Activity: In pairs or individually, patients were asked to create a program with their teammate. Team's were tasked with designing a program, including a Name, Demographic geared towards, Hours of operation, Location and Benefits. Patients would then present ideas to group.   Education:  Leisure Scientist, physiological, Special educational needs teacher, Teamwork, Discharge Planning  Education Outcome: Acknowledges education/In group clarification offered/Needs additional education.    Affect/Mood: N/A   Participation Level: Did not attend    Clinical Observations/Individualized Feedback:      Plan: Continue to engage patient in RT group sessions 2-3x/week.   Modesta Sammons-McCall, LRT,CTRS 10/19/2023 1:13 PM

## 2023-10-19 NOTE — Group Note (Signed)
 Date:  10/19/2023 Time:  5:54 AM  Group Topic/Focus:  Wrap-Up Group:   The focus of this group is to help patients review their daily goal of treatment and discuss progress on daily workbooks.    Participation Level:  Active  Participation Quality:  Appropriate and Attentive  Affect:  Appropriate  Cognitive:  Alert and Appropriate  Insight: Appropriate and Good  Engagement in Group:  Engaged  Modes of Intervention:  Discussion and Education  Additional Comments:  Pt attended and participated in wrap up group this evening and rated their day a 5/10. Pt stated that they made an important phone call that positively affected their day. Pt states that they have no goals that they are working on, but shares that they are now ready to speak with their treatment team, compared to previous encounters that they have had with their SW. Pt has no concerns to relay at this time.   Joshua Thomas 10/19/2023, 5:54 AM

## 2023-10-19 NOTE — Plan of Care (Signed)
  Problem: Education: Goal: Emotional status will improve Outcome: Progressing   Problem: Activity: Goal: Interest or engagement in activities will improve Outcome: Progressing   Problem: Coping: Goal: Ability to verbalize frustrations and anger appropriately will improve Outcome: Progressing   Problem: Safety: Goal: Periods of time without injury will increase Outcome: Progressing

## 2023-10-19 NOTE — Group Note (Signed)
 Date:  10/19/2023 Time:  5:16 PM  Group Topic/Focus: Emotional Regulation - Cooking Up Calm Emotional Education:   The focus of this group is to discuss what feelings/emotions are, and how they are experienced. Managing Feelings:   The focus of this group is to identify what feelings patients have difficulty handling and develop a plan to handle them in a healthier way upon discharge. Group Type: Psychoeducational / Skills-Based Group Objective:  Support participants in identifying, understanding, and managing emotions using a structured, relatable approach. Group Summary: Icebreaker: Mood Menu Discussed emotional regulation as a recipe: Name it, Tame it, Frame it Explored emotional triggers and body cues Played Emotion Charades Completed Emotional Regulation Recipe worksheet Shared coping strategies and prep steps Closed with group reflection Skills Practiced: Emotional awareness Coping strategy selection Peer engagement     Additional Comments:  patient did no attend   Joshua Thomas N Al Gagen 10/19/2023, 5:16 PM

## 2023-10-19 NOTE — Group Note (Unsigned)
 Date:  10/19/2023 Time:  8:23 PM  Group Topic/Focus:  Wrap-Up Group:   The focus of this group is to help patients review their daily goal of treatment and discuss progress on daily workbooks.     Participation Level:  {[facility] PARTICIPATION OZCZO:77735}  Participation Quality:  {[facility] PARTICIPATION QUALITY:22265}  Affect:  {[facility] AFFECT:22266}  Cognitive:  {[facility] COGNITIVE:22267}  Insight: {B[facility] Insight2:20797}  Engagement in Group:  {[facility] ENGAGEMENT IN HMNLE:77731}  Modes of Intervention:  {[facility] MODES OF INTERVENTION:22269}  Additional Comments:  ***  Eward Mace 10/19/2023, 8:23 PM

## 2023-10-19 NOTE — Group Note (Signed)
 Date:  10/19/2023 Time:  9:52 AM  Group Topic/Focus:  Emotional Education:   The focus of this group is to discuss what feelings/emotions are, and how they are experienced. Goals Group:   The focus of this group is to help patients establish daily goals to achieve during treatment and discuss how the patient can incorporate goal setting into their daily lives to aide in recovery. Orientation:   The focus of this group is to educate the patient on the purpose and policies of crisis stabilization and provide a format to answer questions about their admission.  The group details unit policies and expectations of patients while admitted.    Participation Level:  Did Not Attend  Logan LITTIE Molly 10/19/2023, 9:52 AM

## 2023-10-19 NOTE — Progress Notes (Signed)
 Jama ORN Ghazi   Type of Note: Substance Use Treatment  Patient was given list of SA treatment facilities as well as [employer]. Pt states he will look over lists this afternoon and would like social work to check in tomorrow before sending referrals.   Signed:  Darius Lundberg, LCSW-A 10/19/2023  1:54 PM

## 2023-10-19 NOTE — [facility] Group Notes (Signed)
 [facility] Group Notes:  (Nursing/MHT/Case Management/Adjunct)  Date:  10/19/2023  Time:  2000  Type of Therapy:  Narcotics Anonymous Meeting  Participation Level:  Active  Participation Quality:  Appropriate, Attentive, and Supportive  Affect:  Flat  Cognitive:  Alert  Insight:  Improving  Engagement in Group:  Engaged  Modes of Intervention:  Clarification, Education, and Support  Summary of Progress/Problems:  Lenora Manuelita RAMAN 10/19/2023, 9:38 PM

## 2023-10-19 NOTE — Progress Notes (Signed)
   10/19/23 0800  Psych Admission Type (Psych Patients Only)  Admission Status Involuntary  Psychosocial Assessment  Patient Complaints None  Eye Contact Fair  Facial Expression Anxious  Affect Anxious  Speech Logical/coherent  Interaction Assertive  Motor Activity Restless  Appearance/Hygiene Unremarkable  Behavior Characteristics Cooperative  Mood Pleasant  Thought Process  Coherency WDL  Content WDL  Delusions None reported or observed  Perception WDL  Hallucination None reported or observed  Judgment Poor  Confusion None  Danger to Self  Current suicidal ideation? Denies  Danger to Others  Danger to Others None reported or observed

## 2023-10-19 NOTE — Progress Notes (Addendum)
(  Sleep Hours) -6.25 (Any PRNs that were needed, meds refused, or side effects to meds)- prn hydroxyzine  for anxiety and naprosyn  for pain @2138 : pt was very irritable, agitated, and annoyed about not sleeping and constipation and pain all over. Prn haldol  and benadryl  po given @ 2138 (Any disturbances and when (visitation, over night)-Pt was up at approx 0300 for snack and drink (Concerns raised by the patient)- constipation x 4 days (SI/HI/AVH)- denies all  Pt was supposed to get 2U of insulin  this am for BS of 171 and he refused

## 2023-10-19 NOTE — Progress Notes (Cosign Needed Addendum)
 [facility] MD Progress Note  10/19/2023 4:21 PM Joshua Thomas  MRN:  [id number]  Reason for admission: Joshua Thomas is a 48 y.o., male with history of opioid use disorder, substance-induced mood disorder, stimulant use disorder-amphetamine type, homelessness, COPD, hepatitis C, hypertension, diabetes, IV drug use complicated by osteomyelitis presents voluntarily to behavioral health hospital due to suicidal ideation with plan to overdose on fentanyl .   Principal Problem: Opioid use disorder, severe, dependence (HCC) Diagnosis: Principal Problem:   Opioid use disorder, severe, dependence (HCC) Active Problems:   GERD (gastroesophageal reflux disease)   Essential hypertension   MDD (major depressive disorder), recurrent severe, without psychosis (HCC)   Substance induced mood disorder (HCC)  Total Time spent with patient: 45 minutes   24-hour Chart Review: Chart reviewed. Patient's case discussed in interdisciplinary team meeting.  Vital signs reviewed without critical value. As needed hydroxyzine  and oral agitation protocol (Haldol  and Benadryl ) required overnight.  He slept a documented 6.25 hours.  He is adherent to taking psychotropic medication regimen. Nursing notes indicate that he received an oral agitation protocol last night due to irritability, agitation, and annoyance about not sleeping.  Today's Assessment Notes: Joshua Thomas was observed in his room during rounds. He describes his mood as "tired" and reports having denied clonidine  this morning, stating, "It's time to come out of this stupor now." He indicates that gaining access to his cell phone yesterday was a significant improvement, expressing concern about his car parked across the street, which holds sentimental value as it was a gift from his mother. He has expressed interest in inpatient substance use treatment and outpatient therapy. He denies suicidal ideation, including passive thoughts of death or self-harm urges, and also denies  homicidal ideation or psychotic symptoms. He reports no difficulties with sleep, an improving appetite, and denies withdrawal symptoms, though he acknowledges mild cravings. He denies side effects from his current psychiatric medications. He reports pain at the tooth extraction site, with observable edema to the sinus bridge on the left side of his face. He was advised to follow up with a dentist upon discharge and was informed about the initiation of Augmentin  antibiotic therapy.  10/8: Joshua Thomas presents as calm, cooperative, and engaged in treatment discussions. He appears to be tolerating his medications well, with no adverse effects or psychotic symptoms noted. His interest in inpatient substance use treatment has been referred to social work, who has provided him with a list of resources. He is currently on the COWS opiate withdrawal protocol, which he appears to be tolerating well. He continues to refuse clonidine ; therefore, the detox taper will be discontinued. Blood glucose levels have ranged between 165-172. No medication changes are indicated at this time. Will continue his current psychiatric treatment regimen at this time. Initiated Augmentin  twice daily for 7 days to treat the facial abscess.   Past Psychiatric History:  Previous psych diagnoses: ADHD, MDD, PTSD, OUD, stimulant use disorder, alchol use disorder Prior inpatient psychiatric treatment: several Prior outpatient psychiatric treatment: endorses Current psychiatric provider: [facility] Current therapist:denies   History of suicide attempts: endorses History of homicide: endorses   Past Psychotropics:    Substance Use History: Alcohol: drank extensively in the past, in remission Tobacco: daily Illicit Substance: fentanyl , cocaine, methamphetamine   Past Medical History:  Past Medical History:  Diagnosis Date   Acute on chronic osteomyelitis (HCC) 07/04/2021   Acute osteomyelitis of lumbar spine (HCC) 05/17/2017    ADHD (attention deficit hyperactivity disorder)    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   Bipolar 1 disorder (HCC)    Chronic back pain    Chronic pain    chronic l leg pain   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   COPD (chronic obstructive pulmonary disease) (HCC)    DDD (degenerative disc disease), lumbar    Degenerative disc disease, lumbar    Depression    Diabetes mellitus without complication (HCC)    Discitis    Epidural abscess 10/08/2021   GERD (gastroesophageal reflux disease)    Hepatitis C    Hepatitis C    History of kidney stones    History of stomach ulcers    Hx of diabetes mellitus    due to infection   Hypertension    Lung nodules    3 on L and 2 on R   Osteomyelitis of lumbar spine (HCC) 10/13/2022   Polysubstance abuse (HCC) 09/03/2022   PTSD (post-traumatic stress disorder)    Sciatica    Substance abuse (HCC)     Past Surgical History:  Procedure Laterality Date   FRACTURE SURGERY     HARDWARE REMOVAL  01/27/2012   Procedure: HARDWARE REMOVAL;  Surgeon: Lonni CINDERELLA Poli, MD;  Location: [facility];  Service: Orthopedics;  Laterality: Left;   IR LUMBAR DISC ASPIRATION W/IMG GUIDE  06/08/2017   IR LUMBAR DISC ASPIRATION W/IMG GUIDE  07/06/2021   LAPAROSCOPIC LYSIS OF ADHESIONS  05/24/2022   Procedure: LAPAROSCOPIC LYSIS OF ADHESIONS;  Surgeon: Mavis Anes, MD;  Location: [facility];  Service: General;;   RADIOLOGY WITH ANESTHESIA N/A 06/08/2017   Procedure: DISC ASPIRATION;  Surgeon: Dolphus Carrion, MD;  Location: [facility];  Service: Radiology;  Laterality: N/A;   TIBIA IM NAIL INSERTION  01/27/2012   Procedure: INTRAMEDULLARY (IM) NAIL TIBIAL;  Surgeon: Lonni CINDERELLA Poli, MD;  Location: [facility];  Service: Orthopedics;  Laterality: Left;  Removal of IM Rod and Screws Left Tibia with Exchange Nail, Allograft Bone Graft Left Tibia   XI ROBOTIC ASSISTED INGUINAL HERNIA REPAIR WITH MESH Right 05/24/2022   Procedure: XI ROBOTIC ASSISTED  INGUINAL HERNIA REPAIR WITH MESH;  Surgeon: Mavis Anes, MD;  Location: [facility];  Service: General;  Laterality: Right;   Family History:  Family History  Problem Relation Age of Onset   Diabetes Mother    Alcohol abuse Father    Cirrhosis Father    Aneurysm Father    Family Psychiatric  History: As listed above Social History:  Social History   Substance and Sexual Activity  Alcohol Use Not Currently   Alcohol/week: 1.0 standard drink of alcohol   Types: 1 Standard drinks or equivalent per week   Comment: last used July 2023     Social History   Substance and Sexual Activity  Drug Use Not Currently   Types: IV, Fentanyl , Benzodiazepines   Comment: last used July 2023    Social History   Socioeconomic History   Marital status: Single    Spouse name: Not on file   Number of children: Not on file   Years of education: 12   Highest education level: High school graduate  Occupational History   Not on file  Tobacco Use   Smoking status: Every Day    Current packs/day: 1.50    Average packs/day: 1.5 packs/day for 33.0 years (49.5 ttl pk-yrs)    Types: Cigarettes   Smokeless tobacco: Former    Quit date: 08/08/2006   Tobacco comments:    1 PPD  Vaping Use  Vaping status: Every Day  Substance and Sexual Activity   Alcohol use: Not Currently    Alcohol/week: 1.0 standard drink of alcohol    Types: 1 Standard drinks or equivalent per week    Comment: last used July 2023   Drug use: Not Currently    Types: IV, Fentanyl , Benzodiazepines    Comment: last used July 2023   Sexual activity: Not Currently  Other Topics Concern   Not on file  Social History Narrative   Not on file   Social Drivers of Health   Financial Resource Strain: High Risk (09/23/2017)   Overall Financial Resource Strain (CARDIA)    Difficulty of Paying Living Expenses: Very hard  Food Insecurity: Food Insecurity Present (10/15/2023)   Hunger Vital Sign    Worried About Running Out of Food in  the Last Year: Sometimes true    Ran Out of Food in the Last Year: Sometimes true  Transportation Needs: Unmet Transportation Needs (10/15/2023)   PRAPARE - Administrator, Civil Service (Medical): Yes    Lack of Transportation (Non-Medical): Yes  Physical Activity: Unknown (09/23/2017)   Exercise Vital Sign    Days of Exercise per Week: Patient declined    Minutes of Exercise per Session: Patient declined  Stress: Stress Concern Present (09/23/2017)   [employer] of Occupational Health - Occupational Stress Questionnaire    Feeling of Stress : Very much  Social Connections: Socially Isolated (09/23/2017)   Social Connection and Isolation Panel    Frequency of Communication with Friends and Family: Once a week    Frequency of Social Gatherings with Friends and Family: Once a week    Attends Religious Services: Never    Database administrator or Organizations: No    Attends Engineer, structural: Never    Marital Status: Never married   Additional Social History:                           Current Medications: Current Facility-Administered Medications  Medication Dose Route Frequency Provider Last Rate Last Admin   alum & mag hydroxide-simeth (MAALOX/MYLANTA) 200-200-20 MG/5ML suspension 30 mL  30 mL Oral Q4H PRN Kline, Kyra A, NP       amoxicillin -clavulanate (AUGMENTIN ) 875-125 MG per tablet 1 tablet  1 tablet Oral Q12H Raffaella Edison H, NP   1 tablet at 10/19/23 1237   benzocaine (ORAJEL) 10 % mucosal gel   Mouth/Throat QID PRN Blair Robin H, NP   Given at 10/18/23 9076   buprenorphine -naloxone  (SUBOXONE ) 8-2 mg per SL tablet 1 tablet  1 tablet Sublingual Q12H Lynnette Barter, MD   1 tablet at 10/19/23 0818   chlorhexidine  (PERIDEX ) 0.12 % solution 15 mL  15 mL Mouth/Throat BID Leigh Corean Massa, MD   15 mL at 10/19/23 1016   cloNIDine  (CATAPRES ) tablet 0.1 mg  0.1 mg Oral BID Kline, Kyra A, NP   0.1 mg at 10/18/23 9076   dicyclomine   (BENTYL ) tablet 20 mg  20 mg Oral Q6H PRN Kline, Kyra A, NP       haloperidol  (HALDOL ) tablet 5 mg  5 mg Oral TID PRN Kline, Kyra A, NP   5 mg at 10/18/23 2137   And   diphenhydrAMINE  (BENADRYL ) capsule 50 mg  50 mg Oral TID PRN Kline, Kyra A, NP   50 mg at 10/18/23 2137   haloperidol  lactate (HALDOL ) injection 5 mg  5 mg Intramuscular TID PRN Beverli,  Kyra A, NP   5 mg at 10/15/23 2333   And   diphenhydrAMINE  (BENADRYL ) injection 50 mg  50 mg Intramuscular TID PRN Kline, Kyra A, NP       And   LORazepam  (ATIVAN ) injection 2 mg  2 mg Intramuscular TID PRN Kline, Kyra A, NP       haloperidol  lactate (HALDOL ) injection 10 mg  10 mg Intramuscular TID PRN Kline, Kyra A, NP       And   diphenhydrAMINE  (BENADRYL ) injection 50 mg  50 mg Intramuscular TID PRN Kline, Kyra A, NP   50 mg at 10/15/23 2333   And   LORazepam  (ATIVAN ) injection 2 mg  2 mg Intramuscular TID PRN Kline, Kyra A, NP   2 mg at 10/15/23 2333   hydrOXYzine  (ATARAX ) tablet 50 mg  50 mg Oral BID PRN Kline, Kyra A, NP   50 mg at 10/18/23 2137   insulin  aspart (novoLOG ) injection 0-9 Units  0-9 Units Subcutaneous TID WC Lynnette Barter, MD   3 Units at 10/18/23 1724   loperamide  (IMODIUM ) capsule 2-4 mg  2-4 mg Oral PRN Kline, Kyra A, NP       metFORMIN  (GLUCOPHAGE ) tablet 500 mg  500 mg Oral BID WC Kline, Kyra A, NP   500 mg at 10/19/23 0818   methocarbamol  (ROBAXIN ) tablet 500 mg  500 mg Oral Q8H PRN Kline, Kyra A, NP   500 mg at 10/17/23 2103   naproxen  (NAPROSYN ) tablet 500 mg  500 mg Oral BID PRN Kline, Kyra A, NP   500 mg at 10/18/23 2138   nicotine  (NICODERM CQ  - dosed in mg/24 hours) patch 14 mg  14 mg Transdermal Daily Prentis Kitchens A, DO   14 mg at 10/19/23 9180   ondansetron  (ZOFRAN -ODT) disintegrating tablet 4 mg  4 mg Oral Q6H PRN Kline, Kyra A, NP   4 mg at 10/16/23 0902   polyethylene glycol (MIRALAX  / GLYCOLAX ) packet 17 g  17 g Oral Daily Trudy Carwin, NP   17 g at 10/19/23 9180   QUEtiapine  (SEROQUEL ) tablet 100 mg  100 mg  Oral QHS Kline, Kyra A, NP   100 mg at 10/18/23 2138   senna-docusate (Senokot-S) tablet 1 tablet  1 tablet Oral BID Trudy Carwin, NP   1 tablet at 10/19/23 0818   sertraline  (ZOLOFT ) tablet 50 mg  50 mg Oral Daily Ji, Andrew, MD   50 mg at 10/19/23 9181    Lab Results:  Results for orders placed or performed during the hospital encounter of 10/15/23 (from the past 48 hours)  Glucose, capillary     Status: Abnormal   Collection Time: 10/17/23  5:10 PM  Result Value Ref Range   Glucose-Capillary 199 (H) 70 - 99 mg/dL    Comment: Glucose reference range applies only to samples taken after fasting for at least 8 hours.  Glucose, capillary     Status: Abnormal   Collection Time: 10/17/23  9:10 PM  Result Value Ref Range   Glucose-Capillary 212 (H) 70 - 99 mg/dL    Comment: Glucose reference range applies only to samples taken after fasting for at least 8 hours.  Glucose, capillary     Status: Abnormal   Collection Time: 10/18/23  6:23 AM  Result Value Ref Range   Glucose-Capillary 132 (H) 70 - 99 mg/dL    Comment: Glucose reference range applies only to samples taken after fasting for at least 8 hours.   Comment 1 Document in  Chart   Glucose, capillary     Status: Abnormal   Collection Time: 10/18/23 11:56 AM  Result Value Ref Range   Glucose-Capillary 154 (H) 70 - 99 mg/dL    Comment: Glucose reference range applies only to samples taken after fasting for at least 8 hours.  Glucose, capillary     Status: Abnormal   Collection Time: 10/18/23  5:15 PM  Result Value Ref Range   Glucose-Capillary 215 (H) 70 - 99 mg/dL    Comment: Glucose reference range applies only to samples taken after fasting for at least 8 hours.  Glucose, capillary     Status: Abnormal   Collection Time: 10/18/23  8:28 PM  Result Value Ref Range   Glucose-Capillary 177 (H) 70 - 99 mg/dL    Comment: Glucose reference range applies only to samples taken after fasting for at least 8 hours.   Comment 1 Notify RN     Comment 2 Document in Chart   Glucose, capillary     Status: Abnormal   Collection Time: 10/19/23  5:55 AM  Result Value Ref Range   Glucose-Capillary 172 (H) 70 - 99 mg/dL    Comment: Glucose reference range applies only to samples taken after fasting for at least 8 hours.   Comment 1 Notify RN    Comment 2 Document in Chart   Glucose, capillary     Status: Abnormal   Collection Time: 10/19/23 11:49 AM  Result Value Ref Range   Glucose-Capillary 165 (H) 70 - 99 mg/dL    Comment: Glucose reference range applies only to samples taken after fasting for at least 8 hours.   Comment 1 Notify RN    Comment 2 Document in Chart     Blood Alcohol level:  Lab Results  Component Value Date   [facility] <15 10/15/2023   ETH <10 09/03/2022    Metabolic Disorder Labs: Lab Results  Component Value Date   HGBA1C 6.7 (H) 10/17/2023   MPG 145.59 10/17/2023   MPG 162.81 09/04/2022   Lab Results  Component Value Date   PROLACTIN 31.3 (H) 05/23/2015   Lab Results  Component Value Date   CHOL 138 10/17/2023   TRIG 66 10/17/2023   HDL 30 (L) 10/17/2023   CHOLHDL 4.6 10/17/2023   VLDL 13 10/17/2023   LDLCALC 95 10/17/2023   LDLCALC 83 12/09/2020    Physical Findings: AIMS:  ,  ,  ,  ,  ,  ,   CIWA:  CIWA-Ar Total: 3 COWS:  COWS Total Score: 1  Musculoskeletal: Strength & Muscle Tone: within normal limits Gait & Station: normal Patient leans: N/A  Psychiatric Specialty Exam:  Presentation  General Appearance:  Casual  Eye Contact: Good  Speech: Clear and Coherent  Speech Volume: Normal  Handedness:Right   Mood and Affect  Mood: Euthymic (Alright.)  Affect: Congruent   Thought Process  Thought Processes: Coherent; Goal Directed  Descriptions of Associations:Intact  Orientation:Full (Time, Place and Person)  Thought Content:Logical  History of Schizophrenia/Schizoaffective disorder:No data recorded Duration of Psychotic Symptoms:No data  recorded Hallucinations:Hallucinations: None   Ideas of Reference:None  Suicidal Thoughts:Suicidal Thoughts: No   Homicidal Thoughts:Homicidal Thoughts: No    Sensorium  Memory: Immediate Good; Recent Good  Judgment: Fair  Insight: Fair   Art therapist  Concentration: Fair  Attention Span: Fair  Recall: Good  Fund of Knowledge: Fair  Language: Good   Psychomotor Activity  Psychomotor Activity: Psychomotor Activity: Normal    Assets  Assets: Communication  Skills; Desire for Improvement; Resilience   Sleep  Sleep: Sleep: Fair     Physical Exam: Physical Exam Vitals and nursing note reviewed.  Constitutional:      General: He is not in acute distress.    Appearance: He is not ill-appearing.  HENT:     Mouth/Throat:     Pharynx: Oropharynx is clear.     Comments: Complains of tooth pain. Cardiovascular:     Rate and Rhythm: Normal rate.     Pulses: Normal pulses.  Pulmonary:     Effort: No respiratory distress.  Skin:    General: Skin is dry.  Neurological:     Mental Status: He is alert and oriented to person, place, and time.    Review of Systems  Respiratory: Negative.    Cardiovascular: Negative.   Gastrointestinal: Negative.   Genitourinary: Negative.   Skin: Negative.   Neurological: Negative.   Psychiatric/Behavioral:  Positive for depression and substance abuse. Negative for hallucinations, memory loss and suicidal ideas. The patient is nervous/anxious and has insomnia.    Blood pressure (!) 142/86, pulse 67, temperature 98.1 F (36.7 C), temperature source Oral, resp. rate 18, height 5' 3 (1.6 m), weight 62.5 kg, SpO2 100%. Body mass index is 24.41 kg/m.   Treatment Plan Summary: Daily contact with patient to assess and evaluate symptoms and progress in treatment and Medication management    PLAN: Safety and Monitoring:             -- Voluntary admission to inpatient psychiatric unit for safety,  stabilization and treatment             -- Daily contact with patient to assess and evaluate symptoms and progress in treatment             -- Patient's case to be discussed in multi-disciplinary team meeting             -- Observation Level : q15 minute checks             -- Vital signs: q12 hours             -- Precautions: suicide, elopement, and assault   2. Psychiatric Problems #Opioid use disorder, severe dependence #Substance-induced mood disorder #Historical diagnosis of ADHD - Discontinue clonidine  taper per patient's preference (10/8) - COWS  - Suboxone  8-2 twice daily - Seroquel  100 mg nightly             -A1c 6.7               Lipid profile HDL 30, otherwise WNL - Sertraline  50 mg daily - Opiate withdrawal PRNs ordered including Zofran , Imodium , Bentyl , Maalox   Other - Start Augmentin  875/125 mg twice daily x 7 days, oral/facial swelling 10/8  -PRNs: maalox, milk of magnesia, hydroxyzine , trazodone  -- As needed agitation protocol in-place   The risks/benefits/side-effects/alternatives to the above medication were discussed in detail with the patient and time was given for questions. The patient consents to medication trial. FDA black box warnings, if present, were discussed.   The patient is agreeable with the medication plan, as above. We will monitor the patient's response to pharmacologic treatment, and adjust medications as necessary.   3. Medical Problems Type 2 diabetes mellitus -SSI sensitive with meals - A1c ordered   4. Routine and other pertinent labs: EKG monitoring: QTc: 484             -repeat EKG   Metabolism / endocrine: BMI: Body mass index  is 24.41 kg/m. Prolactin: Recent Labs       Lab Results  Component Value Date    PROLACTIN 31.3 (H) 05/23/2015      Lipid Panel: Recent Labs       Lab Results  Component Value Date    CHOL 129 12/09/2020    TRIG 84 12/09/2020    HDL 29 (L) 12/09/2020    CHOLHDL 4.4 12/09/2020    VLDL 17  12/09/2020    LDLCALC 83 12/09/2020    LDLCALC 49 10/08/2020      HbgA1c: Last Labs     Hgb A1c MFr Bld (%)  Date Value  09/04/2022 7.3 (H)      TSH: Last Labs     TSH (uIU/mL)  Date Value  12/09/2020 0.390        5. Group Therapy:             -- Encouraged patient to participate in unit milieu and in scheduled group therapies              -- Short Term Goals: Ability to identify changes in lifestyle to reduce recurrence of condition, verbalize feelings, identify and develop effective coping behaviors, maintain clinical measurements within normal limits, and identify triggers associated with substance abuse/mental health issues will improve. Improvement in ability to demonstrate self-control and comply with prescribed medications.             -- Long Term Goals: Improvement in symptoms so as ready for discharge -- Patient is encouraged to participate in group therapy while admitted to the psychiatric unit. -- We will address other chronic and acute stressors, which contributed to the patient's Opioid use disorder, severe, dependence (HCC) in order to reduce the risk of self-harm at discharge.   6. Discharge Planning:              -- Social work and case management to assist with discharge planning and identification of hospital follow-up needs prior to discharge             -- Estimated LOS: 5-7 days             -- Discharge Concerns: Need to establish a safety plan; Medication compliance and effectiveness             -- Discharge Goals: Return home with outpatient referrals for mental health follow-up including medication management/psychotherapy   I certify that inpatient services furnished can reasonably be expected to improve the patient's condition.   Blair Chiquita Hint, NP 10/19/2023, 4:21 PM Patient ID: Joshua Thomas, male   DOB: 1975/07/05, 48 y.o.   MRN: [id number] Patient ID: Joshua Thomas, male   DOB: 30-May-1975, 48 y.o.   MRN: [id number]

## 2023-10-20 LAB — GLUCOSE, CAPILLARY
Glucose-Capillary: 144 mg/dL — ABNORMAL HIGH (ref 70–99)
Glucose-Capillary: 151 mg/dL — ABNORMAL HIGH (ref 70–99)
Glucose-Capillary: 112 mg/dL — ABNORMAL HIGH (ref 70–99)
Glucose-Capillary: 192 mg/dL — ABNORMAL HIGH (ref 70–99)

## 2023-10-20 NOTE — Progress Notes (Signed)
 [facility] MD Progress Note  10/20/2023 3:06 PM Joshua Thomas  MRN:  [id number] Subjective:  Joshua Thomas was seen in his room on rounds. He feels that he is just doing when asked about mood. When asked to clarify, he states his goals. When asked again he comments on his sleep. He has poor use of emotional language. He is still having dental pain so not eating very well. Not attending groups.   Principal Problem: Opioid use disorder, severe, dependence (HCC) Diagnosis: Principal Problem:   Opioid use disorder, severe, dependence (HCC) Active Problems:   GERD (gastroesophageal reflux disease)   Essential hypertension   MDD (major depressive disorder), recurrent severe, without psychosis (HCC)   Substance induced mood disorder (HCC)  Total Time spent with patient: 15 minutes  Past Psychiatric History: Previous psych diagnoses: ADHD, MDD, PTSD, OUD, stimulant use disorder, alchol use disorder Prior inpatient psychiatric treatment: several Prior outpatient psychiatric treatment: endorses Current psychiatric provider: [facility] Current therapist:denies   History of suicide attempts: endorses History of homicide: endorses   Past Psychotropics:    Substance Use History: Alcohol: drank extensively in the past, in remission Tobacco: daily Illicit Substance: fentanyl , cocaine, methamphetamine  Past Medical History:  Past Medical History:  Diagnosis Date   Acute on chronic osteomyelitis (HCC) 07/04/2021   Acute osteomyelitis of lumbar spine (HCC) 05/17/2017   ADHD (attention deficit hyperactivity disorder)    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   Bipolar 1 disorder (HCC)    Chronic back pain    Chronic pain    chronic l leg pain   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   COPD (chronic obstructive pulmonary disease) (HCC)    DDD (degenerative disc disease), lumbar    Degenerative disc disease, lumbar    Depression    Diabetes mellitus without complication (HCC)     Discitis    Epidural abscess 10/08/2021   GERD (gastroesophageal reflux disease)    Hepatitis C    Hepatitis C    History of kidney stones    History of stomach ulcers    Hx of diabetes mellitus    due to infection   Hypertension    Lung nodules    3 on L and 2 on R   Osteomyelitis of lumbar spine (HCC) 10/13/2022   Polysubstance abuse (HCC) 09/03/2022   PTSD (post-traumatic stress disorder)    Sciatica    Substance abuse (HCC)     Past Surgical History:  Procedure Laterality Date   FRACTURE SURGERY     HARDWARE REMOVAL  01/27/2012   Procedure: HARDWARE REMOVAL;  Surgeon: Lonni CINDERELLA Poli, MD;  Location: [facility];  Service: Orthopedics;  Laterality: Left;   IR LUMBAR DISC ASPIRATION W/IMG GUIDE  06/08/2017   IR LUMBAR DISC ASPIRATION W/IMG GUIDE  07/06/2021   LAPAROSCOPIC LYSIS OF ADHESIONS  05/24/2022   Procedure: LAPAROSCOPIC LYSIS OF ADHESIONS;  Surgeon: Mavis Anes, MD;  Location: [facility];  Service: General;;   RADIOLOGY WITH ANESTHESIA N/A 06/08/2017   Procedure: DISC ASPIRATION;  Surgeon: Dolphus Carrion, MD;  Location: [facility];  Service: Radiology;  Laterality: N/A;   TIBIA IM NAIL INSERTION  01/27/2012   Procedure: INTRAMEDULLARY (IM) NAIL TIBIAL;  Surgeon: Lonni CINDERELLA Poli, MD;  Location: [facility];  Service: Orthopedics;  Laterality: Left;  Removal of IM Rod and Screws Left Tibia with Exchange Nail, Allograft Bone Graft Left Tibia   XI ROBOTIC ASSISTED INGUINAL HERNIA REPAIR WITH MESH Right 05/24/2022   Procedure: XI ROBOTIC  ASSISTED INGUINAL HERNIA REPAIR WITH MESH;  Surgeon: Mavis Anes, MD;  Location: [facility];  Service: General;  Laterality: Right;   Family History:  Family History  Problem Relation Age of Onset   Diabetes Mother    Alcohol abuse Father    Cirrhosis Father    Aneurysm Father    Family Psychiatric  History: see H&P Social History:  Social History   Substance and Sexual Activity  Alcohol Use Not Currently   Alcohol/week: 1.0 standard  drink of alcohol   Types: 1 Standard drinks or equivalent per week   Comment: last used July 2023     Social History   Substance and Sexual Activity  Drug Use Not Currently   Types: IV, Fentanyl , Benzodiazepines   Comment: last used July 2023    Social History   Socioeconomic History   Marital status: Single    Spouse name: Not on file   Number of children: Not on file   Years of education: 12   Highest education level: High school graduate  Occupational History   Not on file  Tobacco Use   Smoking status: Every Day    Current packs/day: 1.50    Average packs/day: 1.5 packs/day for 33.0 years (49.5 ttl pk-yrs)    Types: Cigarettes   Smokeless tobacco: Former    Quit date: 08/08/2006   Tobacco comments:    1 PPD  Vaping Use   Vaping status: Every Day  Substance and Sexual Activity   Alcohol use: Not Currently    Alcohol/week: 1.0 standard drink of alcohol    Types: 1 Standard drinks or equivalent per week    Comment: last used July 2023   Drug use: Not Currently    Types: IV, Fentanyl , Benzodiazepines    Comment: last used July 2023   Sexual activity: Not Currently  Other Topics Concern   Not on file  Social History Narrative   Not on file   Social Drivers of Health   Financial Resource Strain: High Risk (09/23/2017)   Overall Financial Resource Strain (CARDIA)    Difficulty of Paying Living Expenses: Very hard  Food Insecurity: Food Insecurity Present (10/15/2023)   Hunger Vital Sign    Worried About Running Out of Food in the Last Year: Sometimes true    Ran Out of Food in the Last Year: Sometimes true  Transportation Needs: Unmet Transportation Needs (10/15/2023)   PRAPARE - Transportation    Lack of Transportation (Medical): Yes    Lack of Transportation (Non-Medical): Yes  Physical Activity: Unknown (09/23/2017)   Exercise Vital Sign    Days of Exercise per Week: Patient declined    Minutes of Exercise per Session: Patient declined  Stress: Stress Concern  Present (09/23/2017)   [employer] of Occupational Health - Occupational Stress Questionnaire    Feeling of Stress : Very much  Social Connections: Socially Isolated (09/23/2017)   Social Connection and Isolation Panel    Frequency of Communication with Friends and Family: Once a week    Frequency of Social Gatherings with Friends and Family: Once a week    Attends Religious Services: Never    Database administrator or Organizations: No    Attends Banker Meetings: Never    Marital Status: Never married   Additional Social History:                         Sleep: Good Estimated Sleeping Duration (Last 24 Hours): 8.25-9.50  hours  Appetite:  Poor  Current Medications: Current Facility-Administered Medications  Medication Dose Route Frequency Provider Last Rate Last Admin   alum & mag hydroxide-simeth (MAALOX/MYLANTA) 200-200-20 MG/5ML suspension 30 mL  30 mL Oral Q4H PRN Kline, Kyra A, NP       amoxicillin -clavulanate (AUGMENTIN ) 875-125 MG per tablet 1 tablet  1 tablet Oral Q12H Bennett, Christal H, NP   1 tablet at 10/20/23 0900   benzocaine (ORAJEL) 10 % mucosal gel   Mouth/Throat QID PRN Bennett, Christal H, NP   Given at 10/18/23 9076   buprenorphine -naloxone  (SUBOXONE ) 8-2 mg per SL tablet 1 tablet  1 tablet Sublingual Q12H Lynnette Barter, MD   1 tablet at 10/20/23 0900   chlorhexidine  (PERIDEX ) 0.12 % solution 15 mL  15 mL Mouth/Throat BID Leigh Corean Massa, MD   15 mL at 10/20/23 0900   dicyclomine  (BENTYL ) tablet 20 mg  20 mg Oral Q6H PRN Kline, Kyra A, NP       haloperidol  (HALDOL ) tablet 5 mg  5 mg Oral TID PRN Kline, Kyra A, NP   5 mg at 10/18/23 2137   And   diphenhydrAMINE  (BENADRYL ) capsule 50 mg  50 mg Oral TID PRN Kline, Kyra A, NP   50 mg at 10/18/23 2137   haloperidol  lactate (HALDOL ) injection 5 mg  5 mg Intramuscular TID PRN Kline, Kyra A, NP   5 mg at 10/15/23 2333   And   diphenhydrAMINE  (BENADRYL ) injection 50 mg  50 mg Intramuscular  TID PRN Kline, Kyra A, NP       And   LORazepam  (ATIVAN ) injection 2 mg  2 mg Intramuscular TID PRN Kline, Kyra A, NP       haloperidol  lactate (HALDOL ) injection 10 mg  10 mg Intramuscular TID PRN Kline, Kyra A, NP       And   diphenhydrAMINE  (BENADRYL ) injection 50 mg  50 mg Intramuscular TID PRN Kline, Kyra A, NP   50 mg at 10/15/23 2333   And   LORazepam  (ATIVAN ) injection 2 mg  2 mg Intramuscular TID PRN Kline, Kyra A, NP   2 mg at 10/15/23 2333   hydrOXYzine  (ATARAX ) tablet 50 mg  50 mg Oral BID PRN Kline, Kyra A, NP   50 mg at 10/18/23 2137   insulin  aspart (novoLOG ) injection 0-9 Units  0-9 Units Subcutaneous TID WC Lynnette Barter, MD   2 Units at 10/20/23 1213   loperamide  (IMODIUM ) capsule 2-4 mg  2-4 mg Oral PRN Kline, Kyra A, NP       metFORMIN  (GLUCOPHAGE ) tablet 500 mg  500 mg Oral BID WC Kline, Kyra A, NP   500 mg at 10/20/23 0900   methocarbamol  (ROBAXIN ) tablet 500 mg  500 mg Oral Q8H PRN Kline, Kyra A, NP   500 mg at 10/17/23 2103   naproxen  (NAPROSYN ) tablet 500 mg  500 mg Oral BID PRN Kline, Kyra A, NP   500 mg at 10/18/23 2138   nicotine  (NICODERM CQ  - dosed in mg/24 hours) patch 14 mg  14 mg Transdermal Daily Prentis Kitchens A, DO   14 mg at 10/20/23 0900   ondansetron  (ZOFRAN -ODT) disintegrating tablet 4 mg  4 mg Oral Q6H PRN Kline, Kyra A, NP   4 mg at 10/16/23 0902   polyethylene glycol (MIRALAX  / GLYCOLAX ) packet 17 g  17 g Oral Daily Trudy Carwin, NP   17 g at 10/20/23 0900   QUEtiapine  (SEROQUEL ) tablet 100 mg  100 mg Oral QHS  Kline, Kyra A, NP   100 mg at 10/19/23 2119   senna-docusate (Senokot-S) tablet 1 tablet  1 tablet Oral BID Trudy Carwin, NP   1 tablet at 10/20/23 0900   sertraline  (ZOLOFT ) tablet 50 mg  50 mg Oral Daily Ji, Andrew, MD   50 mg at 10/20/23 0900    Lab Results:  Results for orders placed or performed during the hospital encounter of 10/15/23 (from the past 48 hours)  Glucose, capillary     Status: Abnormal   Collection Time: 10/18/23  5:15 PM   Result Value Ref Range   Glucose-Capillary 215 (H) 70 - 99 mg/dL    Comment: Glucose reference range applies only to samples taken after fasting for at least 8 hours.  Glucose, capillary     Status: Abnormal   Collection Time: 10/18/23  8:28 PM  Result Value Ref Range   Glucose-Capillary 177 (H) 70 - 99 mg/dL    Comment: Glucose reference range applies only to samples taken after fasting for at least 8 hours.   Comment 1 Notify RN    Comment 2 Document in Chart   Glucose, capillary     Status: Abnormal   Collection Time: 10/19/23  5:55 AM  Result Value Ref Range   Glucose-Capillary 172 (H) 70 - 99 mg/dL    Comment: Glucose reference range applies only to samples taken after fasting for at least 8 hours.   Comment 1 Notify RN    Comment 2 Document in Chart   Glucose, capillary     Status: Abnormal   Collection Time: 10/19/23 11:49 AM  Result Value Ref Range   Glucose-Capillary 165 (H) 70 - 99 mg/dL    Comment: Glucose reference range applies only to samples taken after fasting for at least 8 hours.   Comment 1 Notify RN    Comment 2 Document in Chart   Glucose, capillary     Status: Abnormal   Collection Time: 10/19/23  4:48 PM  Result Value Ref Range   Glucose-Capillary 106 (H) 70 - 99 mg/dL    Comment: Glucose reference range applies only to samples taken after fasting for at least 8 hours.   Comment 1 Notify RN    Comment 2 Document in Chart   Glucose, capillary     Status: Abnormal   Collection Time: 10/19/23  9:14 PM  Result Value Ref Range   Glucose-Capillary 158 (H) 70 - 99 mg/dL    Comment: Glucose reference range applies only to samples taken after fasting for at least 8 hours.   Comment 1 Notify RN   Glucose, capillary     Status: Abnormal   Collection Time: 10/20/23  5:49 AM  Result Value Ref Range   Glucose-Capillary 144 (H) 70 - 99 mg/dL    Comment: Glucose reference range applies only to samples taken after fasting for at least 8 hours.   Comment 1 Notify RN     Comment 2 Document in Chart   Glucose, capillary     Status: Abnormal   Collection Time: 10/20/23 11:55 AM  Result Value Ref Range   Glucose-Capillary 151 (H) 70 - 99 mg/dL    Comment: Glucose reference range applies only to samples taken after fasting for at least 8 hours.    Blood Alcohol level:  Lab Results  Component Value Date   [facility] <15 10/15/2023   ETH <10 09/03/2022    Metabolic Disorder Labs: Lab Results  Component Value Date   HGBA1C 6.7 (H)  10/17/2023   MPG 145.59 10/17/2023   MPG 162.81 09/04/2022   Lab Results  Component Value Date   PROLACTIN 31.3 (H) 05/23/2015   Lab Results  Component Value Date   CHOL 138 10/17/2023   TRIG 66 10/17/2023   HDL 30 (L) 10/17/2023   CHOLHDL 4.6 10/17/2023   VLDL 13 10/17/2023   LDLCALC 95 10/17/2023   LDLCALC 83 12/09/2020    Physical Findings: AIMS:  ,  ,  ,  ,  ,  ,   CIWA:  CIWA-Ar Total: 3 COWS:  COWS Total Score: 1  Musculoskeletal: Strength & Muscle Tone: within normal limits Gait & Station: normal Patient leans: N/A  Psychiatric Specialty Exam:  Presentation  General Appearance:  Casual  Eye Contact: Fleeting  Speech: Normal Rate  Speech Volume: Normal  Handedness: Right   Mood and Affect  Mood: Irritable  Affect: Congruent   Thought Process  Thought Processes: Goal Directed  Descriptions of Associations:Intact  Orientation:Full (Time, Place and Person)  Thought Content:Logical  History of Schizophrenia/Schizoaffective disorder:No data recorded Duration of Psychotic Symptoms:No data recorded Hallucinations:Hallucinations: None  Ideas of Reference:None  Suicidal Thoughts:Suicidal Thoughts: No  Homicidal Thoughts:Homicidal Thoughts: No   Sensorium  Memory: Immediate Fair; Recent Fair  Judgment: Poor  Insight: Poor   Executive Functions  Concentration: Fair  Attention Span: Fair  Recall: [employer] of  Knowledge: Fair  Language: Fair   Psychomotor Activity  Psychomotor Activity: Psychomotor Activity: Decreased   Assets  Assets: Leisure Time   Sleep  Sleep: Sleep: Good    Physical Exam: Physical Exam Vitals and nursing note reviewed.  Constitutional:      Appearance: Normal appearance.  HENT:     Head: Normocephalic and atraumatic.  Eyes:     Extraocular Movements: Extraocular movements intact.  Pulmonary:     Effort: Pulmonary effort is normal.  Musculoskeletal:        General: Normal range of motion.     Cervical back: Normal range of motion.  Neurological:     General: No focal deficit present.     Mental Status: He is alert and oriented to person, place, and time.  Psychiatric:        Behavior: Behavior is cooperative.        Cognition and Memory: Cognition normal.    Review of Systems  Constitutional:  Negative for chills and fever.  HENT:         Dental pain  Gastrointestinal:  Negative for blood in stool, constipation, nausea and vomiting.  Genitourinary:  Negative for dysuria.  Neurological:  Negative for tremors.  Psychiatric/Behavioral:  Negative for hallucinations and suicidal ideas.    Blood pressure 121/83, pulse 60, temperature 97.9 F (36.6 C), temperature source Oral, resp. rate 16, height 5' 3 (1.6 m), weight 62.5 kg, SpO2 100%. Body mass index is 24.41 kg/m.   Treatment Plan Summary: Daily contact with patient to assess and evaluate symptoms and progress in treatment and Medication management       PLAN: Safety and Monitoring:             -- Voluntary admission to inpatient psychiatric unit for safety, stabilization and treatment             -- Daily contact with patient to assess and evaluate symptoms and progress in treatment             -- Patient's case to be discussed in multi-disciplinary team meeting             --  Observation Level : q15 minute checks             -- Vital signs: q12 hours             -- Precautions:  suicide, elopement, and assault   2. Psychiatric Problems #Opioid use disorder, severe dependence #Substance-induced mood disorder #Historical diagnosis of ADHD - Discontinue clonidine  taper per patient's preference (10/8) - COWS  - Suboxone  8-2 twice daily - Seroquel  100 mg nightly             -A1c 6.7               Lipid profile HDL 30, otherwise WNL - Sertraline  50 mg daily - Opiate withdrawal PRNs ordered including Zofran , Imodium , Bentyl , Maalox   Other - Start Augmentin  875/125 mg twice daily x 7 days, oral/facial swelling 10/8   -PRNs: maalox, milk of magnesia, hydroxyzine , trazodone  -- As needed agitation protocol in-place   The risks/benefits/side-effects/alternatives to the above medication were discussed in detail with the patient and time was given for questions. The patient consents to medication trial. FDA black box warnings, if present, were discussed.   The patient is agreeable with the medication plan, as above. We will monitor the patient's response to pharmacologic treatment, and adjust medications as necessary.   3. Medical Problems Type 2 diabetes mellitus -SSI sensitive with meals - A1c ordered   4. Routine and other pertinent labs: EKG monitoring: QTc: 484             -repeat EKG  5. Group Therapy:             -- Encouraged patient to participate in unit milieu and in scheduled group therapies              -- Short Term Goals: Ability to identify changes in lifestyle to reduce recurrence of condition, verbalize feelings, identify and develop effective coping behaviors, maintain clinical measurements within normal limits, and identify triggers associated with substance abuse/mental health issues will improve. Improvement in ability to demonstrate self-control and comply with prescribed medications.             -- Long Term Goals: Improvement in symptoms so as ready for discharge -- Patient is encouraged to participate in group therapy while admitted to the  psychiatric unit. -- We will address other chronic and acute stressors, which contributed to the patient's Opioid use disorder, severe, dependence (HCC) in order to reduce the risk of self-harm at discharge.   6. Discharge Planning:              -- Social work and case management to assist with discharge planning and identification of hospital follow-up needs prior to discharge             -- Estimated LOS: 5-7 days             -- Discharge Concerns: Need to establish a safety plan; Medication compliance and effectiveness             -- Discharge Goals: Return home with outpatient referrals for mental health follow-up including medication management/psychotherapy   I certify that inpatient services furnished can reasonably be expected to improve the patient's condition.  Corean Anette Potters, MD 10/20/2023, 3:06 PM

## 2023-10-20 NOTE — Group Note (Signed)
 Date:  10/20/2023 Time:  8:51 PM  Group Topic/Focus:  Wrap-Up Group:   The focus of this group is to help patients review their daily goal of treatment and discuss progress on daily workbooks.    Participation Level:  Active  Participation Quality:  Appropriate and Sharing  Affect:  Appropriate  Cognitive:  Appropriate  Insight: Appropriate  Engagement in Group:  Engaged  Modes of Intervention:  Discussion and Socialization  Additional Comments:  Patients shared one high and one low from today during group. Patient high, finally being about to go to the bathroom. Patient did not have any lows today. Patient rated his day a 7 out of 10.  Eward Mace 10/20/2023, 8:51 PM

## 2023-10-20 NOTE — Plan of Care (Signed)
 Nurse discussed anxiety, depression and coping skills with patient.

## 2023-10-20 NOTE — Group Note (Signed)
 LCSW Group Therapy Note   Group Date: 10/20/2023 Start Time: 1100 End Time: 1200   Participation:  did not attend  Type of Therapy:  Group Therapy  Topic:  Understanding Your Path to Change  Objective:  The goal is to help individuals understand the stages of change, identify where they currently are in the process, and provide actionable next steps to continue moving forward in their journey of change.  Goals: - Learn about the six stages of change:  Precontemplation, Contemplation, Preparation, Action, Maintenance, and Relapse - Reflect on Current Change Efforts:  Recognize which stage participants are in regarding a personal change. - Plan Next Steps for Moving Forward:  Create an action plan based on their current stage of change.  Summary:  In this session, we explored the Stages of Change as a framework to understand the process of change.  We discussed how each stage helps individuals recognize where they are in their personal journey and used the Stages of Change Worksheet for self-reflection. Participants answered questions to better understand their current stage, challenges, and progress. We also emphasized the importance of moving forward, even if setbacks (Relapse) occur, and created actionable steps to help participants continue progressing. By the end of the session, participants gained a clearer understanding of their path to change and left with a clear plan for next steps.  Therapeutic Modalities:  Elements of CBT (cognitive restructuring, problem solving)  Element of DBT (mindfulness, distress tolerance)   Jawanna Dykman O Lasheena Frieze, LCSWA 10/20/2023  12:29 PM

## 2023-10-20 NOTE — Group Note (Signed)
 Date:  10/20/2023 Time:  9:29 AM  Group Topic/Focus:  Goals Group:   The focus of this group is to help patients establish daily goals to achieve during treatment and discuss how the patient can incorporate goal setting into their daily lives to aide in recovery.    Participation Level:  Did Not Attend  Participation Quality:  Did Not Attend  Affect:  Did Not Attend  Cognitive:  Did Not Attend  Insight: None  Engagement in Group:  Did Not Attend  Modes of Intervention:  Did Not Attend  Additional Comments:  Did Not Attend  Joshua Thomas 10/20/2023, 9:29 AM

## 2023-10-20 NOTE — Progress Notes (Signed)
 D:  Patient self inventory sheet, patient sleeps good, sleep medication given.  Fair appetite, low energy, poor concentration.  Rated depression and anxiety 5.  Denied withdrawals.  Stated he feels Very tired.  Belly pain #5.  Goal is feel better.  No discharge plan.  A:  Medications administered per MD Orders.  Emotional support and encouragement given patient. R Patient denied SI and HI, contracts for safety.  Denied A/V hallucinations.  Safety maintained with 15 minute checks.

## 2023-10-20 NOTE — Progress Notes (Signed)
 [facility]   Type of Note: Substance Use Treatment  Spoke with patient this afternoon at bedside. Pt reports he has not made phone calls yet. Some interest in an [facility] however does not want to go unless it is a good one. Educated pt about importance of calling and asking questions to make that determination. Pt states he will think about it.  Would like referral sent to [facility]. Referral to be sent.  Will update.  Signed:  Sarahann Horrell, LCSW-A 10/20/2023  1:54 PM

## 2023-10-20 NOTE — Progress Notes (Signed)
 D:  Patient's self inventory sheet, patient sleeps good, sleep medication helpful.  Fair appetite, low energy level, good concentration.  Denied withdrawals.  Denied SI.  Stated he feels very tired.   Belly feels constipated.  Goal is feel better.  No discharge plans at this time. A:  Medications administered per MD orders.  Emotional support and encouragement given patient. R:  Denied SI and HI, contracts for safety.  Denied A/V hallucinations.  Safety maintained with 15 minute checks.

## 2023-10-20 NOTE — Group Note (Signed)
 Occupational Therapy Group Note  Group Topic:Coping Skills  Group Date: 10/20/2023 Start Time: 1500 End Time: 1530 Facilitators: Dot [place] MATSU, OT   Group Description: Group encouraged increased engagement and participation through discussion and activity focused on Coping Ahead. Patients were split up into teams and selected a card from a stack of positive coping strategies. Patients were instructed to act out/charade the coping skill for other peers to guess and receive points for their team. Discussion followed with a focus on identifying additional positive coping strategies and patients shared how they were going to cope ahead over the weekend while continuing hospitalization stay.  Therapeutic Goal(s): Identify positive vs negative coping strategies. Identify coping skills to be used during hospitalization vs coping skills outside of hospital/at home Increase participation in therapeutic group environment and promote engagement in treatment   Participation Level: Engaged   Participation Quality: Independent   Behavior: Appropriate   Speech/Thought Process: Relevant   Affect/Mood: Appropriate   Insight: Fair   Judgement: Fair      Modes of Intervention: Education  Patient Response to Interventions:  Attentive   Plan: Continue to engage patient in OT groups 2 - 3x/week.  10/20/2023  [place] MATSU Dot, OT  Luetta Piazza, OT

## 2023-10-20 NOTE — Progress Notes (Signed)
(  Sleep Hours) - 8.25 (Any PRNs that were needed, meds refused, or side effects to meds)- none (Any disturbances and when (visitation, over night) N/A (Concerns raised by the patient)- pt demanded to be given haldol  for sleep. (SI/HI/AVH)- denies

## 2023-10-20 NOTE — Progress Notes (Signed)
   10/19/23 2054  Psych Admission Type (Psych Patients Only)  Admission Status Involuntary  Psychosocial Assessment  Patient Complaints None  Eye Contact Fair  Facial Expression Anxious  Affect Anxious  Speech Logical/coherent  Interaction Assertive  Motor Activity Slow  Appearance/Hygiene Unremarkable  Behavior Characteristics Appropriate to situation;Cooperative  Mood Pleasant  Thought Process  Coherency WDL  Content WDL  Delusions None reported or observed  Perception WDL  Hallucination None reported or observed  Judgment Poor  Confusion None  Danger to Self  Current suicidal ideation? Denies  Danger to Others  Danger to Others None reported or observed

## 2023-10-20 NOTE — Group Note (Signed)
 Recreation Therapy Group Note   Group Topic:Other  Group Date: 10/20/2023 Start Time: 1304 End Time: 1350 Facilitators: Evadna Donaghy-McCall, LRT,CTRS Location: [address] Dayroom   Activity Description/Intervention: Therapeutic Drumming. Patients with peers and staff were given the opportunity to engage in a leader facilitated [facility] Group Empowerment Drumming Circle with staff from the [employer], in partnership with [employer]. Teaching laboratory technician and trained [employer], Norleen Mon leading with LRT observing and documenting intervention and pt response. This evidenced-based practice targets 7 areas of health and wellbeing in the human experience including: stress-reduction, exercise, self-expression, camaraderie/support, nurturing, spirituality, and music-making (leisure).   Goal Area(s) Addresses:  Patient will engage in pro-social way in music group.  Patient will follow directions of drum leader on the first prompt. Patient will demonstrate no behavioral issues during group.  Patient will identify if a reduction in stress level occurs as a result of participation in therapeutic drum circle.    Education: Leisure exposure, Pharmacologist, Musical expression, Discharge Planning   Affect/Mood: N/A   Participation Level: Did not attend    Clinical Observations/Individualized Feedback:      Plan: Continue to engage patient in RT group sessions 2-3x/week.   Aniza Shor-McCall, LRT,CTRS 10/20/2023 4:05 PM

## 2023-10-21 ENCOUNTER — Encounter (HOSPITAL_COMMUNITY): Payer: Self-pay

## 2023-10-21 LAB — GLUCOSE, CAPILLARY
Glucose-Capillary: 133 mg/dL — ABNORMAL HIGH (ref 70–99)
Glucose-Capillary: 152 mg/dL — ABNORMAL HIGH (ref 70–99)
Glucose-Capillary: 165 mg/dL — ABNORMAL HIGH (ref 70–99)
Glucose-Capillary: 202 mg/dL — ABNORMAL HIGH (ref 70–99)

## 2023-10-21 NOTE — BH IP Treatment Plan (Signed)
 Interdisciplinary Treatment and Diagnostic Plan Update  10/21/2023 Time of Session: 12:05 PM - UPDATE Joshua Thomas MRN: [id number]  Principal Diagnosis: Opioid use disorder, severe, dependence (HCC)  Secondary Diagnoses: Principal Problem:   Opioid use disorder, severe, dependence (HCC) Active Problems:   GERD (gastroesophageal reflux disease)   Essential hypertension   MDD (major depressive disorder), recurrent severe, without psychosis (HCC)   Substance induced mood disorder (HCC)   Current Medications:  Current Facility-Administered Medications  Medication Dose Route Frequency Provider Last Rate Last Admin   alum & mag hydroxide-simeth (MAALOX/MYLANTA) 200-200-20 MG/5ML suspension 30 mL  30 mL Oral Q4H PRN Kline, Kyra A, NP       amoxicillin -clavulanate (AUGMENTIN ) 875-125 MG per tablet 1 tablet  1 tablet Oral Q12H Bennett, Christal H, NP   1 tablet at 10/21/23 0821   benzocaine (ORAJEL) 10 % mucosal gel   Mouth/Throat QID PRN Blair Robin H, NP   Given at 10/18/23 9076   buprenorphine -naloxone  (SUBOXONE ) 8-2 mg per SL tablet 1 tablet  1 tablet Sublingual Q12H Lynnette Barter, MD   1 tablet at 10/21/23 9178   chlorhexidine  (PERIDEX ) 0.12 % solution 15 mL  15 mL Mouth/Throat BID Leigh Corean Massa, MD   15 mL at 10/21/23 1710   haloperidol  (HALDOL ) tablet 5 mg  5 mg Oral TID PRN Kline, Kyra A, NP   5 mg at 10/18/23 2137   And   diphenhydrAMINE  (BENADRYL ) capsule 50 mg  50 mg Oral TID PRN Kline, Kyra A, NP   50 mg at 10/18/23 2137   haloperidol  lactate (HALDOL ) injection 5 mg  5 mg Intramuscular TID PRN Kline, Kyra A, NP   5 mg at 10/15/23 2333   And   diphenhydrAMINE  (BENADRYL ) injection 50 mg  50 mg Intramuscular TID PRN Kline, Kyra A, NP       And   LORazepam  (ATIVAN ) injection 2 mg  2 mg Intramuscular TID PRN Kline, Kyra A, NP       haloperidol  lactate (HALDOL ) injection 10 mg  10 mg Intramuscular TID PRN Kline, Kyra A, NP       And   diphenhydrAMINE  (BENADRYL ) injection  50 mg  50 mg Intramuscular TID PRN Kline, Kyra A, NP   50 mg at 10/15/23 2333   And   LORazepam  (ATIVAN ) injection 2 mg  2 mg Intramuscular TID PRN Kline, Kyra A, NP   2 mg at 10/15/23 2333   hydrOXYzine  (ATARAX ) tablet 50 mg  50 mg Oral BID PRN Kline, Kyra A, NP   50 mg at 10/20/23 2112   insulin  aspart (novoLOG ) injection 0-9 Units  0-9 Units Subcutaneous TID WC Ji, Andrew, MD   2 Units at 10/20/23 1213   metFORMIN  (GLUCOPHAGE ) tablet 500 mg  500 mg Oral BID WC Kline, Kyra A, NP   500 mg at 10/21/23 1710   nicotine  (NICODERM CQ  - dosed in mg/24 hours) patch 14 mg  14 mg Transdermal Daily Prentis Kitchens A, DO   14 mg at 10/21/23 9177   polyethylene glycol (MIRALAX  / GLYCOLAX ) packet 17 g  17 g Oral Daily Trudy Carwin, NP   17 g at 10/21/23 9177   QUEtiapine  (SEROQUEL ) tablet 100 mg  100 mg Oral QHS Kline, Kyra A, NP   100 mg at 10/20/23 2112   senna-docusate (Senokot-S) tablet 1 tablet  1 tablet Oral BID Trudy Carwin, NP   1 tablet at 10/21/23 1710   sertraline  (ZOLOFT ) tablet 50 mg  50 mg Oral Daily  Lynnette Barter, MD   50 mg at 10/21/23 9178   PTA Medications: Medications Prior to Admission  Medication Sig Dispense Refill Last Dose/Taking   ADDERALL 10 MG tablet Take 10 mg by mouth daily.   Unknown   cloNIDine  (CATAPRES ) 0.1 MG tablet Take 0.1 mg by mouth at bedtime.   Unknown   cyclobenzaprine  (FLEXERIL ) 10 MG tablet Take 10 mg by mouth 3 (three) times daily as needed.   Unknown   DULoxetine  (CYMBALTA ) 30 MG capsule Take 30 mg by mouth daily.   Unknown   QUEtiapine  Fumarate (SEROQUEL  XR) 150 MG 24 hr tablet Take 150 mg by mouth at bedtime.   Unknown   Buprenorphine  HCl-Naloxone  HCl (SUBOXONE ) 8-2 MG FILM Place 1 Film under the tongue 2 (two) times daily.   Unknown   naloxone  (NARCAN ) nasal spray 4 mg/0.1 mL Take in case of overdose 1 each 0 Unknown    Patient Stressors: Financial difficulties   Health problems   Medication change or noncompliance   Occupational concerns   Substance  abuse    Patient Strengths: Average or above average intelligence  Capable of independent living  Communication skills   Treatment Modalities: Medication Management, Group therapy, Case management,  1 to 1 session with clinician, Psychoeducation, Recreational therapy.   Physician Treatment Plan for Primary Diagnosis: Opioid use disorder, severe, dependence (HCC) Long Term Goal(s):     Short Term Goals:    Medication Management: Evaluate patient's response, side effects, and tolerance of medication regimen.  Therapeutic Interventions: 1 to 1 sessions, Unit Group sessions and Medication administration.  Evaluation of Outcomes: Progressing  Physician Treatment Plan for Secondary Diagnosis: Principal Problem:   Opioid use disorder, severe, dependence (HCC) Active Problems:   GERD (gastroesophageal reflux disease)   Essential hypertension   MDD (major depressive disorder), recurrent severe, without psychosis (HCC)   Substance induced mood disorder (HCC)  Long Term Goal(s):     Short Term Goals:       Medication Management: Evaluate patient's response, side effects, and tolerance of medication regimen.  Therapeutic Interventions: 1 to 1 sessions, Unit Group sessions and Medication administration.  Evaluation of Outcomes: Progressing   RN Treatment Plan for Primary Diagnosis: Opioid use disorder, severe, dependence (HCC) Long Term Goal(s): Knowledge of disease and therapeutic regimen to maintain health will improve  Short Term Goals: Ability to remain free from injury will improve, Ability to verbalize frustration and anger appropriately will improve, Ability to verbalize feelings will improve, and Ability to disclose and discuss suicidal ideas  Medication Management: RN will administer medications as ordered by provider, will assess and evaluate patient's response and provide education to patient for prescribed medication. RN will report any adverse and/or side effects to  prescribing provider.  Therapeutic Interventions: 1 on 1 counseling sessions, Psychoeducation, Medication administration, Evaluate responses to treatment, Monitor vital signs and CBGs as ordered, Perform/monitor CIWA, COWS, AIMS and Fall Risk screenings as ordered, Perform wound care treatments as ordered.  Evaluation of Outcomes: Progressing   LCSW Treatment Plan for Primary Diagnosis: Opioid use disorder, severe, dependence (HCC) Long Term Goal(s): Safe transition to appropriate next level of care at discharge, Engage patient in therapeutic group addressing interpersonal concerns.  Short Term Goals: Engage patient in aftercare planning with referrals and resources, Increase ability to appropriately verbalize feelings, Facilitate acceptance of mental health diagnosis and concerns, and Identify triggers associated with mental health/substance abuse issues  Therapeutic Interventions: Assess for all discharge needs, 1 to 1 time with Child psychotherapist, Explore  available resources and support systems, Assess for adequacy in community support network, Educate family and significant other(s) on suicide prevention, Complete Psychosocial Assessment, Interpersonal group therapy.  Evaluation of Outcomes: Progressing   Progress in Treatment: Attending groups: attended some groups Participating in groups: Yes Taking medication as prescribed: Yes. Toleration medication: Yes. Family/Significant other contact made: No, will contact:  pt declined to give consent to contact family/friends. Patient understands diagnosis: Yes. Discussing patient identified problems/goals with staff: Yes. Medical problems stabilized or resolved: Yes. Denies suicidal/homicidal ideation: Yes. Issues/concerns per patient self-inventory: No.   Patient Goals:  get myself together.   Discharge Plan or Barriers: Patient will likely discharge to shelter once stable.    Reason for Continuation of Hospitalization:  Depression Medication stabilization Withdrawal symptoms   Estimated Length of Stay: 4 - 6 days  Last 3 [place] Suicide Severity Risk Score: Flowsheet Row [facility] (Current[facility] Most recent reading at 10/15/2023  5:34 PM ED from 10/15/2023 in [facility] Department [facility] Most recent reading at 10/15/2023  1:32 PM ED from 02/21/2023 in [facility] Emergency Department at [facility] Most recent reading at 02/21/2023  5:44 PM  C-SSRS RISK CATEGORY Moderate Risk High Risk No Risk    Last PHQ 2/9 Scores:    09/16/2022    9:41 AM 09/06/2022   12:43 PM 09/04/2022   12:57 AM  Depression screen PHQ 2/9  Decreased Interest 0 0 1  Down, Depressed, Hopeless 0 0 1  PHQ - 2 Score 0 0 2  Altered sleeping   1  Tired, decreased energy   1  Change in appetite   0  Feeling bad or failure about yourself    0  Trouble concentrating   1  Moving slowly or fidgety/restless   0  Suicidal thoughts   0  PHQ-9 Score   5  Difficult doing work/chores   Somewhat difficult    Scribe for Treatment Team: Caulin Begley O Deone Leifheit, LCSWA 10/21/2023 8:22 PM

## 2023-10-21 NOTE — Progress Notes (Signed)
 Pt approaches med window and reports increasing agitation, irritability, and anxiety regarding having to sit and wait an hour for the dayroom to be opened following shift change. Pt was offered mild agitation protocol of haldol  5 mg and benadryl  50 mg PO, which he accepted. Pt remained calm and cooperative in interactions with staff and communicated his feelings appropriately. Pt remains safe on the unit at this time. Will continue to assess.

## 2023-10-21 NOTE — Progress Notes (Signed)
 [facility] MD Progress Note  10/21/2023 4:33 PM Joshua Thomas  MRN:  [id number] Subjective:  Joshua Thomas was seen in his room on rounds. He is sleeping and eating a bit better, and dental pain is improving. He feels that he is kosher today, which is improved from yesterday. He still has a lot of hopelessness, but there is some improvements in future goal setting. No hallucinations. He still will have thoughts of being better off dead.   Principal Problem: Opioid use disorder, severe, dependence (HCC) Diagnosis: Principal Problem:   Opioid use disorder, severe, dependence (HCC) Active Problems:   GERD (gastroesophageal reflux disease)   Essential hypertension   MDD (major depressive disorder), recurrent severe, without psychosis (HCC)   Substance induced mood disorder (HCC)  Total Time spent with patient: 30 minutes  Past Psychiatric History: Previous psych diagnoses: ADHD, MDD, PTSD, OUD, stimulant use disorder, alchol use disorder Prior inpatient psychiatric treatment: several Prior outpatient psychiatric treatment: endorses Current psychiatric provider: [facility] Current therapist:denies  Past Medical History:  Past Medical History:  Diagnosis Date   Acute on chronic osteomyelitis (HCC) 07/04/2021   Acute osteomyelitis of lumbar spine (HCC) 05/17/2017   ADHD (attention deficit hyperactivity disorder)    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   Bipolar 1 disorder (HCC)    Chronic back pain    Chronic pain    chronic l leg pain   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   COPD (chronic obstructive pulmonary disease) (HCC)    DDD (degenerative disc disease), lumbar    Degenerative disc disease, lumbar    Depression    Diabetes mellitus without complication (HCC)    Discitis    Epidural abscess 10/08/2021   GERD (gastroesophageal reflux disease)    Hepatitis C    Hepatitis C    History of kidney stones    History of stomach ulcers    Hx of diabetes mellitus     due to infection   Hypertension    Lung nodules    3 on L and 2 on R   Osteomyelitis of lumbar spine (HCC) 10/13/2022   Polysubstance abuse (HCC) 09/03/2022   PTSD (post-traumatic stress disorder)    Sciatica    Substance abuse (HCC)     Past Surgical History:  Procedure Laterality Date   FRACTURE SURGERY     HARDWARE REMOVAL  01/27/2012   Procedure: HARDWARE REMOVAL;  Surgeon: Lonni CINDERELLA Poli, MD;  Location: [facility];  Service: Orthopedics;  Laterality: Left;   IR LUMBAR DISC ASPIRATION W/IMG GUIDE  06/08/2017   IR LUMBAR DISC ASPIRATION W/IMG GUIDE  07/06/2021   LAPAROSCOPIC LYSIS OF ADHESIONS  05/24/2022   Procedure: LAPAROSCOPIC LYSIS OF ADHESIONS;  Surgeon: Mavis Anes, MD;  Location: [facility];  Service: General;;   RADIOLOGY WITH ANESTHESIA N/A 06/08/2017   Procedure: DISC ASPIRATION;  Surgeon: Dolphus Carrion, MD;  Location: [facility];  Service: Radiology;  Laterality: N/A;   TIBIA IM NAIL INSERTION  01/27/2012   Procedure: INTRAMEDULLARY (IM) NAIL TIBIAL;  Surgeon: Lonni CINDERELLA Poli, MD;  Location: [facility];  Service: Orthopedics;  Laterality: Left;  Removal of IM Rod and Screws Left Tibia with Exchange Nail, Allograft Bone Graft Left Tibia   XI ROBOTIC ASSISTED INGUINAL HERNIA REPAIR WITH MESH Right 05/24/2022   Procedure: XI ROBOTIC ASSISTED INGUINAL HERNIA REPAIR WITH MESH;  Surgeon: Mavis Anes, MD;  Location: [facility];  Service: General;  Laterality: Right;   Family History:  Family History  Problem Relation  Age of Onset   Diabetes Mother    Alcohol abuse Father    Cirrhosis Father    Aneurysm Father    Family Psychiatric  History: see H&P Social History:  Social History   Substance and Sexual Activity  Alcohol Use Not Currently   Alcohol/week: 1.0 standard drink of alcohol   Types: 1 Standard drinks or equivalent per week   Comment: last used July 2023     Social History   Substance and Sexual Activity  Drug Use Not Currently   Types: IV, Fentanyl ,  Benzodiazepines   Comment: last used July 2023    Social History   Socioeconomic History   Marital status: Single    Spouse name: Not on file   Number of children: Not on file   Years of education: 12   Highest education level: High school graduate  Occupational History   Not on file  Tobacco Use   Smoking status: Every Day    Current packs/day: 1.50    Average packs/day: 1.5 packs/day for 33.0 years (49.5 ttl pk-yrs)    Types: Cigarettes   Smokeless tobacco: Former    Quit date: 08/08/2006   Tobacco comments:    1 PPD  Vaping Use   Vaping status: Every Day  Substance and Sexual Activity   Alcohol use: Not Currently    Alcohol/week: 1.0 standard drink of alcohol    Types: 1 Standard drinks or equivalent per week    Comment: last used July 2023   Drug use: Not Currently    Types: IV, Fentanyl , Benzodiazepines    Comment: last used July 2023   Sexual activity: Not Currently  Other Topics Concern   Not on file  Social History Narrative   Not on file   Social Drivers of Health   Financial Resource Strain: High Risk (09/23/2017)   Overall Financial Resource Strain (CARDIA)    Difficulty of Paying Living Expenses: Very hard  Food Insecurity: Food Insecurity Present (10/15/2023)   Hunger Vital Sign    Worried About Running Out of Food in the Last Year: Sometimes true    Ran Out of Food in the Last Year: Sometimes true  Transportation Needs: Unmet Transportation Needs (10/15/2023)   PRAPARE - Transportation    Lack of Transportation (Medical): Yes    Lack of Transportation (Non-Medical): Yes  Physical Activity: Unknown (09/23/2017)   Exercise Vital Sign    Days of Exercise per Week: Patient declined    Minutes of Exercise per Session: Patient declined  Stress: Stress Concern Present (09/23/2017)   [employer] of Occupational Health - Occupational Stress Questionnaire    Feeling of Stress : Very much  Social Connections: Socially Isolated (09/23/2017)   Social  Connection and Isolation Panel    Frequency of Communication with Friends and Family: Once a week    Frequency of Social Gatherings with Friends and Family: Once a week    Attends Religious Services: Never    Database administrator or Organizations: No    Attends Engineer, structural: Never    Marital Status: Never married   Additional Social History:                         Sleep: Fair Estimated Sleeping Duration (Last 24 Hours): 9.00-11.75 hours  Appetite:  Fair  Current Medications: Current Facility-Administered Medications  Medication Dose Route Frequency Provider Last Rate Last Admin   alum & mag hydroxide-simeth (MAALOX/MYLANTA) 200-200-20 MG/5ML suspension  30 mL  30 mL Oral Q4H PRN Kline, Kyra A, NP       amoxicillin -clavulanate (AUGMENTIN ) 875-125 MG per tablet 1 tablet  1 tablet Oral Q12H Bennett, Christal H, NP   1 tablet at 10/21/23 0821   benzocaine (ORAJEL) 10 % mucosal gel   Mouth/Throat QID PRN Bennett, Christal H, NP   Given at 10/18/23 9076   buprenorphine -naloxone  (SUBOXONE ) 8-2 mg per SL tablet 1 tablet  1 tablet Sublingual Q12H Lynnette Barter, MD   1 tablet at 10/21/23 9178   chlorhexidine  (PERIDEX ) 0.12 % solution 15 mL  15 mL Mouth/Throat BID Leigh Corean Massa, MD   15 mL at 10/21/23 9177   haloperidol  (HALDOL ) tablet 5 mg  5 mg Oral TID PRN Kline, Kyra A, NP   5 mg at 10/18/23 2137   And   diphenhydrAMINE  (BENADRYL ) capsule 50 mg  50 mg Oral TID PRN Kline, Kyra A, NP   50 mg at 10/18/23 2137   haloperidol  lactate (HALDOL ) injection 5 mg  5 mg Intramuscular TID PRN Kline, Kyra A, NP   5 mg at 10/15/23 2333   And   diphenhydrAMINE  (BENADRYL ) injection 50 mg  50 mg Intramuscular TID PRN Kline, Kyra A, NP       And   LORazepam  (ATIVAN ) injection 2 mg  2 mg Intramuscular TID PRN Kline, Kyra A, NP       haloperidol  lactate (HALDOL ) injection 10 mg  10 mg Intramuscular TID PRN Kline, Kyra A, NP       And   diphenhydrAMINE  (BENADRYL ) injection 50  mg  50 mg Intramuscular TID PRN Kline, Kyra A, NP   50 mg at 10/15/23 2333   And   LORazepam  (ATIVAN ) injection 2 mg  2 mg Intramuscular TID PRN Kline, Kyra A, NP   2 mg at 10/15/23 2333   hydrOXYzine  (ATARAX ) tablet 50 mg  50 mg Oral BID PRN Kline, Kyra A, NP   50 mg at 10/20/23 2112   insulin  aspart (novoLOG ) injection 0-9 Units  0-9 Units Subcutaneous TID WC Lynnette Barter, MD   2 Units at 10/20/23 1213   metFORMIN  (GLUCOPHAGE ) tablet 500 mg  500 mg Oral BID WC Kline, Kyra A, NP   500 mg at 10/21/23 9178   nicotine  (NICODERM CQ  - dosed in mg/24 hours) patch 14 mg  14 mg Transdermal Daily Prentis Kitchens A, DO   14 mg at 10/21/23 9177   polyethylene glycol (MIRALAX  / GLYCOLAX ) packet 17 g  17 g Oral Daily Trudy Carwin, NP   17 g at 10/21/23 9177   QUEtiapine  (SEROQUEL ) tablet 100 mg  100 mg Oral QHS Kline, Kyra A, NP   100 mg at 10/20/23 2112   senna-docusate (Senokot-S) tablet 1 tablet  1 tablet Oral BID Trudy Carwin, NP   1 tablet at 10/21/23 9178   sertraline  (ZOLOFT ) tablet 50 mg  50 mg Oral Daily Lynnette Barter, MD   50 mg at 10/21/23 9178    Lab Results:  Results for orders placed or performed during the hospital encounter of 10/15/23 (from the past 48 hours)  Glucose, capillary     Status: Abnormal   Collection Time: 10/19/23  4:48 PM  Result Value Ref Range   Glucose-Capillary 106 (H) 70 - 99 mg/dL    Comment: Glucose reference range applies only to samples taken after fasting for at least 8 hours.   Comment 1 Notify RN    Comment 2 Document in Chart   Glucose,  capillary     Status: Abnormal   Collection Time: 10/19/23  9:14 PM  Result Value Ref Range   Glucose-Capillary 158 (H) 70 - 99 mg/dL    Comment: Glucose reference range applies only to samples taken after fasting for at least 8 hours.   Comment 1 Notify RN   Glucose, capillary     Status: Abnormal   Collection Time: 10/20/23  5:49 AM  Result Value Ref Range   Glucose-Capillary 144 (H) 70 - 99 mg/dL    Comment: Glucose  reference range applies only to samples taken after fasting for at least 8 hours.   Comment 1 Notify RN    Comment 2 Document in Chart   Glucose, capillary     Status: Abnormal   Collection Time: 10/20/23 11:55 AM  Result Value Ref Range   Glucose-Capillary 151 (H) 70 - 99 mg/dL    Comment: Glucose reference range applies only to samples taken after fasting for at least 8 hours.  Glucose, capillary     Status: Abnormal   Collection Time: 10/20/23  5:29 PM  Result Value Ref Range   Glucose-Capillary 112 (H) 70 - 99 mg/dL    Comment: Glucose reference range applies only to samples taken after fasting for at least 8 hours.  Glucose, capillary     Status: Abnormal   Collection Time: 10/20/23  7:52 PM  Result Value Ref Range   Glucose-Capillary 192 (H) 70 - 99 mg/dL    Comment: Glucose reference range applies only to samples taken after fasting for at least 8 hours.   Comment 1 Notify RN   Glucose, capillary     Status: Abnormal   Collection Time: 10/21/23  5:50 AM  Result Value Ref Range   Glucose-Capillary 133 (H) 70 - 99 mg/dL    Comment: Glucose reference range applies only to samples taken after fasting for at least 8 hours.   Comment 1 Notify RN    Comment 2 Document in Chart   Glucose, capillary     Status: Abnormal   Collection Time: 10/21/23 11:39 AM  Result Value Ref Range   Glucose-Capillary 152 (H) 70 - 99 mg/dL    Comment: Glucose reference range applies only to samples taken after fasting for at least 8 hours.   Comment 1 Notify RN    Comment 2 Document in Chart     Blood Alcohol level:  Lab Results  Component Value Date   [facility] <15 10/15/2023   ETH <10 09/03/2022    Metabolic Disorder Labs: Lab Results  Component Value Date   HGBA1C 6.7 (H) 10/17/2023   MPG 145.59 10/17/2023   MPG 162.81 09/04/2022   Lab Results  Component Value Date   PROLACTIN 31.3 (H) 05/23/2015   Lab Results  Component Value Date   CHOL 138 10/17/2023   TRIG 66 10/17/2023   HDL 30  (L) 10/17/2023   CHOLHDL 4.6 10/17/2023   VLDL 13 10/17/2023   LDLCALC 95 10/17/2023   LDLCALC 83 12/09/2020    Physical Findings: AIMS:  ,  ,  ,  ,  ,  ,   CIWA:  CIWA-Ar Total: 3 COWS:  COWS Total Score: 0  Musculoskeletal: Strength & Muscle Tone: within normal limits Gait & Station: normal Patient leans: N/A  Psychiatric Specialty Exam:  Presentation  General Appearance:  Casual; Appropriate for Environment  Eye Contact: Good  Speech: Normal Rate  Speech Volume: Normal  Handedness: Right   Mood and Affect  Mood: Depressed  Affect: Congruent   Thought Process  Thought Processes: Linear  Descriptions of Associations:Intact  Orientation:Full (Time, Place and Person)  Thought Content:Rumination  History of Schizophrenia/Schizoaffective disorder:No data recorded Duration of Psychotic Symptoms:No data recorded Hallucinations:Hallucinations: None  Ideas of Reference:None  Suicidal Thoughts:Suicidal Thoughts: Yes, Passive (conditional ruminations on death and dying)  Homicidal Thoughts:Homicidal Thoughts: No   Sensorium  Memory: Immediate Good; Recent Fair; Remote Fair  Judgment: Poor  Insight: Poor   Executive Functions  Concentration: Fair  Attention Span: Fair  Recall: Fair  Fund of Knowledge: Good  Language: Good   Psychomotor Activity  Psychomotor Activity: Psychomotor Activity: Normal   Assets  Assets: Communication Skills   Sleep  Sleep: Sleep: Good    Physical Exam: Physical Exam Vitals and nursing note reviewed.  Constitutional:      Appearance: Normal appearance.  HENT:     Head: Normocephalic and atraumatic.  Eyes:     Extraocular Movements: Extraocular movements intact.  Pulmonary:     Effort: Pulmonary effort is normal.  Musculoskeletal:        General: Normal range of motion.     Cervical back: Normal range of motion.  Neurological:     General: No focal deficit present.     Mental  Status: He is alert and oriented to person, place, and time.  Psychiatric:        Mood and Affect: Mood is anxious and depressed.    Review of Systems  Constitutional:  Negative for chills and fever.  HENT:         Dental pain  Respiratory:  Negative for cough.   Musculoskeletal:  Negative for joint pain and neck pain.  Skin:  Negative for rash.  Neurological:  Negative for tremors.   Blood pressure 126/73, pulse 69, temperature 98.2 F (36.8 C), temperature source Oral, resp. rate 16, height 5' 3 (1.6 m), weight 62.5 kg, SpO2 100%. Body mass index is 24.41 kg/m.   Treatment Plan Summary: Daily contact with patient to assess and evaluate symptoms and progress in treatment and Medication management       PLAN: Safety and Monitoring:             -- Voluntary admission to inpatient psychiatric unit for safety, stabilization and treatment             -- Daily contact with patient to assess and evaluate symptoms and progress in treatment             -- Patient's case to be discussed in multi-disciplinary team meeting             -- Observation Level : q15 minute checks             -- Vital signs: q12 hours             -- Precautions: suicide, elopement, and assault   2. Psychiatric Problems #Opioid use disorder, severe dependence #Substance-induced mood disorder #Historical diagnosis of ADHD - Discontinue clonidine  taper per patient's preference (10/8) - COWS  - Suboxone  8-2 twice daily - Seroquel  100 mg nightly             -A1c 6.7               Lipid profile HDL 30, otherwise WNL - Sertraline  50 mg daily - Opiate withdrawal PRNs ordered including Zofran , Imodium , Bentyl , Maalox   Other - Augmentin  875/125 mg twice daily x 7 days, oral/facial swelling 10/8 -PRNs: maalox, milk of magnesia, hydroxyzine , trazodone  --  As needed agitation protocol in-place   3. Medical Problems Type 2 diabetes mellitus -SSI sensitive with meals   4. Routine and other pertinent  labs: EKG monitoring: QTc: 484             -repeat EKG 5. Group Therapy:             -- Encouraged patient to participate in unit milieu and in scheduled group therapies              -- Short Term Goals: Ability to identify changes in lifestyle to reduce recurrence of condition, verbalize feelings, identify and develop effective coping behaviors, maintain clinical measurements within normal limits, and identify triggers associated with substance abuse/mental health issues will improve. Improvement in ability to demonstrate self-control and comply with prescribed medications.             -- Long Term Goals: Improvement in symptoms so as ready for discharge -- Patient is encouraged to participate in group therapy while admitted to the psychiatric unit. -- We will address other chronic and acute stressors, which contributed to the patient's Opioid use disorder, severe, dependence (HCC) in order to reduce the risk of self-harm at discharge.   6. Discharge Planning:              -- Social work and case management to assist with discharge planning and identification of hospital follow-up needs prior to discharge             -- Estimated LOS: 5-7 days             -- Discharge Concerns: Need to establish a safety plan; Medication compliance and effectiveness             -- Discharge Goals: Return home with outpatient referrals for mental health follow-up including medication management/psychotherapy   I certify that inpatient services furnished can reasonably be expected to improve the patient's condition.  Corean Anette Potters, MD 10/21/2023, 4:33 PM

## 2023-10-21 NOTE — Plan of Care (Signed)
   Problem: Education: Goal: Emotional status will improve Outcome: Progressing Goal: Mental status will improve Outcome: Progressing Goal: Verbalization of understanding the information provided will improve Outcome: Progressing

## 2023-10-21 NOTE — Group Note (Signed)
 Date:  10/21/2023 Time:  9:07 PM  Group Topic/Focus:  Wrap-Up Group:   The focus of this group is to help patients review their daily goal of treatment and discuss progress on daily workbooks.    Participation Level:  Active  Participation Quality:  Appropriate  Affect:  Appropriate  Cognitive:  Appropriate  Insight: Improving  Engagement in Group:  Engaged  Modes of Intervention:  Discussion  Additional Comments:  Pt attended the evening wrap-up group. Tech introduced the staff for the evening, reminded group of the evening schedule and reminded them to ask for anything they need. Pt read and discussed a poem named Autobiography in [employer]. Pt read and shared his perspective.   Joshua Thomas 10/21/2023, 9:07 PM

## 2023-10-21 NOTE — Group Note (Signed)
 Date:  10/21/2023 Time:  6:08 PM  Group Topic/Focus:  Coping With Mental Health Crisis:   The purpose of this group is to help patients identify strategies for coping with mental health crisis.  Group discusses possible causes of crisis and ways to manage them effectively. Developing a Wellness Toolbox:   The focus of this group is to help patients develop a wellness toolbox with skills and strategies to promote recovery upon discharge. Self Care:   The focus of this group is to help patients understand the importance of self-care in order to improve or restore emotional, physical, spiritual, interpersonal, and financial health.    Participation Level:  Active  Participation Quality:  Appropriate  Affect:  Appropriate  Cognitive:  Appropriate  Insight: Appropriate  Engagement in Group:  Engaged  Modes of Intervention:  Discussion  Additional Comments:    Joshua Thomas 10/21/2023, 6:08 PM

## 2023-10-21 NOTE — Progress Notes (Signed)
   10/21/23 0915  Psych Admission Type (Psych Patients Only)  Admission Status Involuntary  Psychosocial Assessment  Patient Complaints Anxiety  Eye Contact Fair  Facial Expression Anxious  Affect Anxious  Speech Logical/coherent  Interaction Assertive  Motor Activity Slow  Appearance/Hygiene Unremarkable  Behavior Characteristics Cooperative;Appropriate to situation  Mood Anxious  Thought Process  Coherency WDL  Content WDL  Delusions None reported or observed  Perception WDL  Hallucination None reported or observed  Judgment Poor  Confusion None  Danger to Self  Current suicidal ideation? Denies  Agreement Not to Harm Self Yes  Description of Agreement Verbal  Danger to Others  Danger to Others None reported or observed

## 2023-10-21 NOTE — Group Note (Signed)
 Recreation Therapy Group Note   Group Topic:General Recreation  Group Date: 10/21/2023 Start Time: 0930 End Time: 1000 Facilitators: Ashleymarie Granderson-McCall, LRT,CTRS Location: [address] Dayroom   Group Topic/Focus: General Recreation   Goal Area(s) Addresses:  Patient will use appropriate interactions with peers.   Patient will work together to come up with answers.  Behavioral Response:   Intervention: Worksheet, Music  Activity: Dentist. Patients were given a worksheet (front and back) of brain teasers. Patients could work together to figure out each individual puzzle presented. While working, LRT played music. LRT and patients went over the answers when patients completed the assignment.     Affect/Mood: N/A   Participation Level: Did not attend    Clinical Observations/Individualized Feedback:     Plan: Continue to engage patient in RT group sessions 2-3x/week.   Keilen Kahl-McCall, LRT,CTRS 10/21/2023 11:38 AM

## 2023-10-21 NOTE — Plan of Care (Signed)
  Problem: Activity: Goal: Interest or engagement in activities will improve Outcome: Progressing Goal: Sleeping patterns will improve Outcome: Progressing   Problem: Coping: Goal: Ability to verbalize frustrations and anger appropriately will improve Outcome: Progressing Goal: Ability to demonstrate self-control will improve Outcome: Progressing   Problem: Safety: Goal: Periods of time without injury will increase Outcome: Progressing

## 2023-10-21 NOTE — Progress Notes (Signed)
 [facility]   Type of Note: Dental List  Patient was given list of dentists in the [place]  that accept medicaid and are accepting new patients.   Signed:  Gennett Garcia, LCSW-A 10/21/2023  1:51 PM

## 2023-10-21 NOTE — Progress Notes (Signed)
(  Sleep Hours) - 9.5 (Any PRNs that were needed, meds refused, or side effects to meds)- Vistaril  25 mg for anxiety at HS (Any disturbances and when (visitation, over night)- NONE (Concerns raised by the patient)- None (SI/HI/AVH)- None

## 2023-10-21 NOTE — Progress Notes (Signed)
 [facility]   Type of Note: [facility]   Referral faxed today for substance use treatment. Pt has phone number to complete phone screen.   Signed:  Denissa Cozart, LCSW-A 10/21/2023  10:43 AM

## 2023-10-22 LAB — GLUCOSE, CAPILLARY: Glucose-Capillary: 131 mg/dL — ABNORMAL HIGH (ref 70–99)

## 2023-10-22 NOTE — Progress Notes (Signed)
(  Sleep Hours) - 12 (Any PRNs that were needed, meds refused, or side effects to meds)- PRN haldol  5 mg and benadryl  50 mg given PO per mild agitation protocol. Pt refused 0700 dose of insulin  1 unit for CBG 131.  (Any disturbances and when (visitation, over night)- See progress note (Concerns raised by the patient)- None  (SI/HI/AVH)- Denies SI/HI/AVH

## 2023-10-22 NOTE — Progress Notes (Signed)
 [facility] MD Progress Note  10/22/2023 3:19 PM Joshua Thomas  MRN:  [id number] Subjective:  Joshua Thomas was seen in his room today on rounds. He feels about like any other day today. He is sleeping and eating okay. No withdrawal symptoms other than rhinorrhea. No physical pain or GI issues. He is socializing more with peers and has made some new friends on the unit.   Principal Problem: Opioid use disorder, severe, dependence (HCC) Diagnosis: Principal Problem:   Opioid use disorder, severe, dependence (HCC) Active Problems:   GERD (gastroesophageal reflux disease)   Essential hypertension   MDD (major depressive disorder), recurrent severe, without psychosis (HCC)   Substance induced mood disorder (HCC)  Total Time spent with patient: 15 minutes  Past Psychiatric History: Previous psych diagnoses: ADHD, MDD, PTSD, OUD, stimulant use disorder, alchol use disorder Prior inpatient psychiatric treatment: several Prior outpatient psychiatric treatment: endorses Current psychiatric provider: [facility] Current therapist:denies  Past Medical History:  Past Medical History:  Diagnosis Date   Acute on chronic osteomyelitis (HCC) 07/04/2021   Acute osteomyelitis of lumbar spine (HCC) 05/17/2017   ADHD (attention deficit hyperactivity disorder)    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   Bipolar 1 disorder (HCC)    Chronic back pain    Chronic pain    chronic l leg pain   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   COPD (chronic obstructive pulmonary disease) (HCC)    DDD (degenerative disc disease), lumbar    Degenerative disc disease, lumbar    Depression    Diabetes mellitus without complication (HCC)    Discitis    Epidural abscess 10/08/2021   GERD (gastroesophageal reflux disease)    Hepatitis C    Hepatitis C    History of kidney stones    History of stomach ulcers    Hx of diabetes mellitus    due to infection   Hypertension    Lung nodules    3 on L and 2  on R   Osteomyelitis of lumbar spine (HCC) 10/13/2022   Polysubstance abuse (HCC) 09/03/2022   PTSD (post-traumatic stress disorder)    Sciatica    Substance abuse (HCC)     Past Surgical History:  Procedure Laterality Date   FRACTURE SURGERY     HARDWARE REMOVAL  01/27/2012   Procedure: HARDWARE REMOVAL;  Surgeon: Lonni CINDERELLA Poli, MD;  Location: [facility];  Service: Orthopedics;  Laterality: Left;   IR LUMBAR DISC ASPIRATION W/IMG GUIDE  06/08/2017   IR LUMBAR DISC ASPIRATION W/IMG GUIDE  07/06/2021   LAPAROSCOPIC LYSIS OF ADHESIONS  05/24/2022   Procedure: LAPAROSCOPIC LYSIS OF ADHESIONS;  Surgeon: Mavis Anes, MD;  Location: [facility];  Service: General;;   RADIOLOGY WITH ANESTHESIA N/A 06/08/2017   Procedure: DISC ASPIRATION;  Surgeon: Dolphus Carrion, MD;  Location: [facility];  Service: Radiology;  Laterality: N/A;   TIBIA IM NAIL INSERTION  01/27/2012   Procedure: INTRAMEDULLARY (IM) NAIL TIBIAL;  Surgeon: Lonni CINDERELLA Poli, MD;  Location: [facility];  Service: Orthopedics;  Laterality: Left;  Removal of IM Rod and Screws Left Tibia with Exchange Nail, Allograft Bone Graft Left Tibia   XI ROBOTIC ASSISTED INGUINAL HERNIA REPAIR WITH MESH Right 05/24/2022   Procedure: XI ROBOTIC ASSISTED INGUINAL HERNIA REPAIR WITH MESH;  Surgeon: Mavis Anes, MD;  Location: [facility];  Service: General;  Laterality: Right;   Family History:  Family History  Problem Relation Age of Onset   Diabetes Mother    Alcohol  abuse Father    Cirrhosis Father    Aneurysm Father    Family Psychiatric  History: see H&P Social History:  Social History   Substance and Sexual Activity  Alcohol Use Not Currently   Alcohol/week: 1.0 standard drink of alcohol   Types: 1 Standard drinks or equivalent per week   Comment: last used July 2023     Social History   Substance and Sexual Activity  Drug Use Not Currently   Types: IV, Fentanyl , Benzodiazepines   Comment: last used July 2023    Social History    Socioeconomic History   Marital status: Single    Spouse name: Not on file   Number of children: Not on file   Years of education: 12   Highest education level: High school graduate  Occupational History   Not on file  Tobacco Use   Smoking status: Every Day    Current packs/day: 1.50    Average packs/day: 1.5 packs/day for 33.0 years (49.5 ttl pk-yrs)    Types: Cigarettes   Smokeless tobacco: Former    Quit date: 08/08/2006   Tobacco comments:    1 PPD  Vaping Use   Vaping status: Every Day  Substance and Sexual Activity   Alcohol use: Not Currently    Alcohol/week: 1.0 standard drink of alcohol    Types: 1 Standard drinks or equivalent per week    Comment: last used July 2023   Drug use: Not Currently    Types: IV, Fentanyl , Benzodiazepines    Comment: last used July 2023   Sexual activity: Not Currently  Other Topics Concern   Not on file  Social History Narrative   Not on file   Social Drivers of Health   Financial Resource Strain: High Risk (09/23/2017)   Overall Financial Resource Strain (CARDIA)    Difficulty of Paying Living Expenses: Very hard  Food Insecurity: Food Insecurity Present (10/15/2023)   Hunger Vital Sign    Worried About Running Out of Food in the Last Year: Sometimes true    Ran Out of Food in the Last Year: Sometimes true  Transportation Needs: Unmet Transportation Needs (10/15/2023)   PRAPARE - Transportation    Lack of Transportation (Medical): Yes    Lack of Transportation (Non-Medical): Yes  Physical Activity: Unknown (09/23/2017)   Exercise Vital Sign    Days of Exercise per Week: Patient declined    Minutes of Exercise per Session: Patient declined  Stress: Stress Concern Present (09/23/2017)   [employer] of Occupational Health - Occupational Stress Questionnaire    Feeling of Stress : Very much  Social Connections: Socially Isolated (09/23/2017)   Social Connection and Isolation Panel    Frequency of Communication with Friends  and Family: Once a week    Frequency of Social Gatherings with Friends and Family: Once a week    Attends Religious Services: Never    Database administrator or Organizations: No    Attends Banker Meetings: Never    Marital Status: Never married   Additional Social History:                         Sleep: Good Estimated Sleeping Duration (Last 24 Hours): 9.25-11.75 hours  Appetite:  Good  Current Medications: Current Facility-Administered Medications  Medication Dose Route Frequency Provider Last Rate Last Admin   alum & mag hydroxide-simeth (MAALOX/MYLANTA) 200-200-20 MG/5ML suspension 30 mL  30 mL Oral Q4H PRN Kline, Kyra A,  NP       amoxicillin -clavulanate (AUGMENTIN ) 875-125 MG per tablet 1 tablet  1 tablet Oral Q12H Bennett, Christal H, NP   1 tablet at 10/22/23 0801   benzocaine (ORAJEL) 10 % mucosal gel   Mouth/Throat QID PRN Bennett, Christal H, NP   Given at 10/18/23 9076   buprenorphine -naloxone  (SUBOXONE ) 8-2 mg per SL tablet 1 tablet  1 tablet Sublingual Q12H Lynnette Barter, MD   1 tablet at 10/22/23 0801   chlorhexidine  (PERIDEX ) 0.12 % solution 15 mL  15 mL Mouth/Throat BID Leigh Corean Massa, MD   15 mL at 10/22/23 0800   haloperidol  (HALDOL ) tablet 5 mg  5 mg Oral TID PRN Kline, Kyra A, NP   5 mg at 10/21/23 2106   And   diphenhydrAMINE  (BENADRYL ) capsule 50 mg  50 mg Oral TID PRN Kline, Kyra A, NP   50 mg at 10/21/23 2106   haloperidol  lactate (HALDOL ) injection 5 mg  5 mg Intramuscular TID PRN Kline, Kyra A, NP   5 mg at 10/15/23 2333   And   diphenhydrAMINE  (BENADRYL ) injection 50 mg  50 mg Intramuscular TID PRN Kline, Kyra A, NP       And   LORazepam  (ATIVAN ) injection 2 mg  2 mg Intramuscular TID PRN Kline, Kyra A, NP       haloperidol  lactate (HALDOL ) injection 10 mg  10 mg Intramuscular TID PRN Kline, Kyra A, NP       And   diphenhydrAMINE  (BENADRYL ) injection 50 mg  50 mg Intramuscular TID PRN Kline, Kyra A, NP   50 mg at 10/15/23 2333    And   LORazepam  (ATIVAN ) injection 2 mg  2 mg Intramuscular TID PRN Kline, Kyra A, NP   2 mg at 10/15/23 2333   hydrOXYzine  (ATARAX ) tablet 50 mg  50 mg Oral BID PRN Kline, Kyra A, NP   50 mg at 10/20/23 2112   insulin  aspart (novoLOG ) injection 0-9 Units  0-9 Units Subcutaneous TID WC Lynnette Barter, MD   2 Units at 10/20/23 1213   metFORMIN  (GLUCOPHAGE ) tablet 500 mg  500 mg Oral BID WC Kline, Kyra A, NP   500 mg at 10/22/23 0801   nicotine  (NICODERM CQ  - dosed in mg/24 hours) patch 14 mg  14 mg Transdermal Daily Prentis Kitchens A, DO   14 mg at 10/22/23 0805   polyethylene glycol (MIRALAX  / GLYCOLAX ) packet 17 g  17 g Oral Daily Trudy Carwin, NP   17 g at 10/22/23 0800   QUEtiapine  (SEROQUEL ) tablet 100 mg  100 mg Oral QHS Kline, Kyra A, NP   100 mg at 10/21/23 2103   senna-docusate (Senokot-S) tablet 1 tablet  1 tablet Oral BID Trudy Carwin, NP   1 tablet at 10/22/23 0801   sertraline  (ZOLOFT ) tablet 50 mg  50 mg Oral Daily Lynnette Barter, MD   50 mg at 10/22/23 9198    Lab Results:  Results for orders placed or performed during the hospital encounter of 10/15/23 (from the past 48 hours)  Glucose, capillary     Status: Abnormal   Collection Time: 10/20/23  5:29 PM  Result Value Ref Range   Glucose-Capillary 112 (H) 70 - 99 mg/dL    Comment: Glucose reference range applies only to samples taken after fasting for at least 8 hours.  Glucose, capillary     Status: Abnormal   Collection Time: 10/20/23  7:52 PM  Result Value Ref Range   Glucose-Capillary 192 (H) 70 -  99 mg/dL    Comment: Glucose reference range applies only to samples taken after fasting for at least 8 hours.   Comment 1 Notify RN   Glucose, capillary     Status: Abnormal   Collection Time: 10/21/23  5:50 AM  Result Value Ref Range   Glucose-Capillary 133 (H) 70 - 99 mg/dL    Comment: Glucose reference range applies only to samples taken after fasting for at least 8 hours.   Comment 1 Notify RN    Comment 2 Document in Chart    Glucose, capillary     Status: Abnormal   Collection Time: 10/21/23 11:39 AM  Result Value Ref Range   Glucose-Capillary 152 (H) 70 - 99 mg/dL    Comment: Glucose reference range applies only to samples taken after fasting for at least 8 hours.   Comment 1 Notify RN    Comment 2 Document in Chart   Glucose, capillary     Status: Abnormal   Collection Time: 10/21/23  4:59 PM  Result Value Ref Range   Glucose-Capillary 165 (H) 70 - 99 mg/dL    Comment: Glucose reference range applies only to samples taken after fasting for at least 8 hours.   Comment 1 Notify RN    Comment 2 Document in Chart   Glucose, capillary     Status: Abnormal   Collection Time: 10/21/23  7:23 PM  Result Value Ref Range   Glucose-Capillary 202 (H) 70 - 99 mg/dL    Comment: Glucose reference range applies only to samples taken after fasting for at least 8 hours.  Glucose, capillary     Status: Abnormal   Collection Time: 10/22/23  5:57 AM  Result Value Ref Range   Glucose-Capillary 131 (H) 70 - 99 mg/dL    Comment: Glucose reference range applies only to samples taken after fasting for at least 8 hours.    Blood Alcohol level:  Lab Results  Component Value Date   [facility] <15 10/15/2023   ETH <10 09/03/2022    Metabolic Disorder Labs: Lab Results  Component Value Date   HGBA1C 6.7 (H) 10/17/2023   MPG 145.59 10/17/2023   MPG 162.81 09/04/2022   Lab Results  Component Value Date   PROLACTIN 31.3 (H) 05/23/2015   Lab Results  Component Value Date   CHOL 138 10/17/2023   TRIG 66 10/17/2023   HDL 30 (L) 10/17/2023   CHOLHDL 4.6 10/17/2023   VLDL 13 10/17/2023   LDLCALC 95 10/17/2023   LDLCALC 83 12/09/2020    Physical Findings: AIMS:  ,  ,  ,  ,  ,  ,   CIWA:  CIWA-Ar Total: 3 COWS:  COWS Total Score: 2  Musculoskeletal: Strength & Muscle Tone: within normal limits Gait & Station: normal Patient leans: N/A  Psychiatric Specialty Exam:  Presentation  General Appearance:  Casual  Eye  Contact: Good  Speech: Normal Rate  Speech Volume: Normal  Handedness: Right   Mood and Affect  Mood: Depressed  Affect: Congruent   Thought Process  Thought Processes: Linear  Descriptions of Associations:Intact  Orientation:Full (Time, Place and Person)  Thought Content:Logical  History of Schizophrenia/Schizoaffective disorder:No data recorded Duration of Psychotic Symptoms:No data recorded Hallucinations:Hallucinations: None  Ideas of Reference:None  Suicidal Thoughts:Suicidal Thoughts: No  Homicidal Thoughts:Homicidal Thoughts: No   Sensorium  Memory: Immediate Good; Recent Fair  Judgment: Fair  Insight: Fair   Art therapist  Concentration: Fair  Attention Span: Fair  Recall: [employer] of Knowledge:  Good  Language: Good   Psychomotor Activity  Psychomotor Activity: Psychomotor Activity: Normal   Assets  Assets: Communication Skills; Desire for Improvement   Sleep  Sleep: Sleep: Good    Physical Exam: Physical Exam Vitals and nursing note reviewed.  Constitutional:      Appearance: Normal appearance.  HENT:     Head: Normocephalic and atraumatic.     Nose: Rhinorrhea present.  Eyes:     Extraocular Movements: Extraocular movements intact.  Pulmonary:     Effort: Pulmonary effort is normal.  Musculoskeletal:        General: Normal range of motion.     Cervical back: Normal range of motion.  Neurological:     General: No focal deficit present.     Mental Status: He is alert and oriented to person, place, and time.  Psychiatric:        Behavior: Behavior normal.    Review of Systems  Constitutional:  Negative for chills and fever.  HENT:  Negative for congestion.   Respiratory:  Negative for cough and shortness of breath.   Cardiovascular:  Negative for chest pain.  Gastrointestinal:  Negative for nausea and vomiting.  Skin:  Negative for rash.  Neurological:  Negative for tremors.   Psychiatric/Behavioral:  Negative for hallucinations and suicidal ideas.    Blood pressure 123/85, pulse 80, temperature 98.2 F (36.8 C), temperature source Oral, resp. rate 18, height 5' 3 (1.6 m), weight 62.5 kg, SpO2 99%. Body mass index is 24.41 kg/m.   Treatment Plan Summary: Daily contact with patient to assess and evaluate symptoms and progress in treatment and Medication management       PLAN: Safety and Monitoring:             -- Voluntary admission to inpatient psychiatric unit for safety, stabilization and treatment             -- Daily contact with patient to assess and evaluate symptoms and progress in treatment             -- Patient's case to be discussed in multi-disciplinary team meeting             -- Observation Level : q15 minute checks             -- Vital signs: q12 hours             -- Precautions: suicide, elopement, and assault   2. Psychiatric Problems #Opioid use disorder, severe dependence #Substance-induced mood disorder #Historical diagnosis of ADHD - Discontinue clonidine  taper per patient's preference (10/8) - COWS  - Suboxone  8-2 twice daily - Seroquel  100 mg nightly - Sertraline  50 mg daily - Opiate withdrawal PRNs ordered including Zofran , Imodium , Bentyl , Maalox   Other - Augmentin  875/125 mg twice daily x 7 days, oral/facial swelling 10/8 -PRNs: maalox, milk of magnesia, hydroxyzine , trazodone  -- As needed agitation protocol in-place   3. Medical Problems Type 2 diabetes mellitus -SSI sensitive with meals   4. Routine and other pertinent labs: EKG monitoring: QTc: 484             -repeat EKG has bradycardia, otherwise wnl 5. Group Therapy:             -- Encouraged patient to participate in unit milieu and in scheduled group therapies              -- Short Term Goals: Ability to identify changes in lifestyle to reduce recurrence of condition, verbalize feelings, identify and  develop effective coping behaviors, maintain clinical  measurements within normal limits, and identify triggers associated with substance abuse/mental health issues will improve. Improvement in ability to demonstrate self-control and comply with prescribed medications.             -- Long Term Goals: Improvement in symptoms so as ready for discharge -- Patient is encouraged to participate in group therapy while admitted to the psychiatric unit. -- We will address other chronic and acute stressors, which contributed to the patient's Opioid use disorder, severe, dependence (HCC) in order to reduce the risk of self-harm at discharge.   6. Discharge Planning:              -- Social work and case management to assist with discharge planning and identification of hospital follow-up needs prior to discharge             -- Estimated LOS: 5-7 days             -- Discharge Concerns: Need to establish a safety plan; Medication compliance and effectiveness             -- Discharge Goals: Return home with outpatient referrals for mental health follow-up including medication management/psychotherapy   I certify that inpatient services furnished can reasonably be expected to improve the patient's condition.  Corean Anette Potters, MD 10/22/2023, 3:19 PM

## 2023-10-22 NOTE — Progress Notes (Addendum)
   10/22/23 2214  Psych Admission Type (Psych Patients Only)  Admission Status Voluntary  Psychosocial Assessment  Patient Complaints Anxiety;Substance abuse;Irritability  Eye Contact Fair  Facial Expression Anxious  Affect Anxious  Speech Logical/coherent  Interaction Assertive  Motor Activity Other (Comment)  Appearance/Hygiene Unremarkable  Behavior Characteristics Appropriate to situation  Mood Anxious  Thought Process  Coherency WDL  Content WDL  Delusions None reported or observed  Perception WDL  Hallucination None reported or observed  Judgment Poor  Confusion None  Danger to Self  Current suicidal ideation? Denies  Agreement Not to Harm Self Yes  Description of Agreement verbal  Danger to Others  Danger to Others None reported or observed

## 2023-10-22 NOTE — Progress Notes (Signed)
   10/22/23 0800  Psych Admission Type (Psych Patients Only)  Admission Status Voluntary  Psychosocial Assessment  Patient Complaints Anxiety;Agitation;Irritability  Eye Contact Fair  Facial Expression Anxious  Affect Anxious  Speech Logical/coherent  Interaction Assertive  Motor Activity Slow  Appearance/Hygiene Unremarkable  Behavior Characteristics Cooperative  Mood Anxious  Thought Process  Coherency WDL  Content WDL  Delusions None reported or observed  Perception WDL  Hallucination None reported or observed  Judgment Poor  Confusion None  Danger to Self  Current suicidal ideation? Denies  Description of Suicide Plan No Plan  Agreement Not to Harm Self Yes  Description of Agreement Verbal  Danger to Others  Danger to Others None reported or observed

## 2023-10-22 NOTE — Group Note (Signed)
 Date:  10/22/2023 Time:  3:39 PM  Group Topic/Focus: Responsibility- Discussing the importance of taking personal responsibility  Self Care:   The focus of this group is to help patients understand the importance of self-care in order to improve or restore emotional, physical, spiritual, interpersonal, and financial health.    Participation Level:  Did Not Attend    Shanda JONETTA Challenger 10/22/2023, 3:39 PM

## 2023-10-22 NOTE — Plan of Care (Signed)
   Problem: Education: Goal: Mental status will improve Outcome: Progressing   Problem: Activity: Goal: Interest or engagement in activities will improve Outcome: Progressing

## 2023-10-22 NOTE — Plan of Care (Signed)
  Problem: Education: Goal: Emotional status will improve Outcome: Progressing Goal: Mental status will improve Outcome: Progressing Goal: Verbalization of understanding the information provided will improve Outcome: Progressing   Problem: Activity: Goal: Interest or engagement in activities will improve Outcome: Progressing   Problem: Coping: Goal: Ability to verbalize frustrations and anger appropriately will improve Outcome: Progressing

## 2023-10-23 DIAGNOSIS — F1914 Other psychoactive substance abuse with psychoactive substance-induced mood disorder: Secondary | ICD-10-CM

## 2023-10-23 LAB — GLUCOSE, CAPILLARY
Glucose-Capillary: 124 mg/dL — ABNORMAL HIGH (ref 70–99)
Glucose-Capillary: 119 mg/dL — ABNORMAL HIGH (ref 70–99)
Glucose-Capillary: 145 mg/dL — ABNORMAL HIGH (ref 70–99)

## 2023-10-23 MED ORDER — NICOTINE 21 MG/24HR TD PT24
21.0000 mg | MEDICATED_PATCH | Freq: Every day | TRANSDERMAL | Status: DC
Start: 2023-10-23 — End: 2023-10-25
  Administered 2023-10-23 – 2023-10-25 (×3): 21 mg via TRANSDERMAL
  Filled 2023-10-23 (×3): qty 1

## 2023-10-23 NOTE — Plan of Care (Signed)
  Problem: Education: Goal: Mental status will improve Outcome: Progressing   Problem: Activity: Goal: Interest or engagement in activities will improve Outcome: Progressing   Problem: Coping: Goal: Ability to demonstrate self-control will improve Outcome: Progressing   Problem: Health Behavior/Discharge Planning: Goal: Identification of resources available to assist in meeting health care needs will improve Outcome: Progressing

## 2023-10-23 NOTE — Group Note (Signed)
 Date:  10/23/2023 Time:  5:57 AM  Group Topic/Focus:  Wrap-Up Group:   The focus of this group is to help patients review their daily goal of treatment and discuss progress on daily workbooks.    Participation Level:  Patient participated   Participation Quality:  Appropriate  Affect:  Appropriate  Cognitive:  Appropriate  Insight: Good  Engagement in Group:  Engaged  Modes of Intervention:  Discussion  Additional Comments:  Patient participated in group patient day was a 9 out out of a 10.    Bari Moats 10/23/2023, 5:57 AM

## 2023-10-23 NOTE — Progress Notes (Signed)
   10/23/23 0900  Psych Admission Type (Psych Patients Only)  Admission Status Voluntary  Psychosocial Assessment  Patient Complaints Anxiety;Substance abuse  Eye Contact Fair  Facial Expression Anxious  Affect Anxious  Speech Logical/coherent  Interaction Assertive  Motor Activity Other (Comment) (WNL)  Appearance/Hygiene Unremarkable  Behavior Characteristics Appropriate to situation  Mood Anxious  Thought Process  Coherency WDL  Content WDL  Delusions None reported or observed  Perception WDL  Hallucination None reported or observed  Judgment Poor  Confusion None  Danger to Self  Current suicidal ideation? Denies  Agreement Not to Harm Self Yes  Description of Agreement verbal  Danger to Others  Danger to Others None reported or observed

## 2023-10-23 NOTE — [facility] Group Notes (Signed)
 Adult Psychoeducational Group Note   Date:  10/23/2023 Time:  10:19 AM   Group Topic/Focus:  Activity:  Thumball activities for groups involve tossing a ball with questions or prompts, where the person who catches it responds to the panel under their thumb. This can be used for various purposes, such as icebreakers, team-building, and even physical warm-ups. Specific activities include collaborative exercises, guided discussions, and even movement-based games.    Participation Level:  Did Not Attend   Additional Comments:  Pt was invited but did not attend group for thumbball activity.    Joshua Thomas 10/23/2023, 10:19 AM

## 2023-10-23 NOTE — Progress Notes (Signed)
   10/23/23 2220  Psych Admission Type (Psych Patients Only)  Admission Status Voluntary  Psychosocial Assessment  Patient Complaints Substance abuse;Anxiety  Eye Contact Fair  Facial Expression Anxious  Affect Anxious  Speech Logical/coherent  Interaction Assertive  Motor Activity Other (Comment) (WDL)  Appearance/Hygiene Unremarkable  Behavior Characteristics Appropriate to situation  Mood Anxious  Thought Process  Coherency WDL  Content WDL  Delusions None reported or observed  Perception WDL  Hallucination None reported or observed  Judgment Poor  Confusion None  Danger to Self  Current suicidal ideation? Denies  Agreement Not to Harm Self Yes  Description of Agreement verbal  Danger to Others  Danger to Others None reported or observed

## 2023-10-23 NOTE — Plan of Care (Signed)
  Problem: Activity: Goal: Interest or engagement in activities will improve Outcome: Progressing   Problem: Activity: Goal: Sleeping patterns will improve Outcome: Progressing   

## 2023-10-23 NOTE — [facility] Group Notes (Signed)
 Adult Psychoeducational Group Note  Date:  10/23/2023 Time:  10:13 AM  Group Topic/Focus:  Goals Group:   The focus of this group is to help patients establish daily goals to achieve during treatment and discuss how the patient can incorporate goal setting into their daily lives to aide in recovery.  Participation Level:  Did Not Attend  Additional Comments:  Pt was invited but did not attend orientation/goals group.  Niel CHRISTELLA Nightingale 10/23/2023, 10:13 AM

## 2023-10-23 NOTE — Plan of Care (Signed)
   Problem: Education: Goal: Mental status will improve Outcome: Progressing   Problem: Activity: Goal: Interest or engagement in activities will improve Outcome: Progressing

## 2023-10-23 NOTE — Group Note (Unsigned)
 Date:  10/23/2023 Time:  8:31 PM  Group Topic/Focus:  Wrap-Up Group:   The focus of this group is to help patients review their daily goal of treatment and discuss progress on daily workbooks.     Participation Level:  {[facility] PARTICIPATION OZCZO:77735}  Participation Quality:  {[facility] PARTICIPATION QUALITY:22265}  Affect:  {[facility] AFFECT:22266}  Cognitive:  {[facility] COGNITIVE:22267}  Insight: {[facility] Insight2:20797}  Engagement in Group:  {[facility] ENGAGEMENT IN HMNLE:77731}  Modes of Intervention:  {[facility] MODES OF INTERVENTION:22269}  Additional Comments:  ***  Gwenn Nobie [place] 10/23/2023, 8:31 PM

## 2023-10-23 NOTE — Progress Notes (Signed)
 [facility] MD Progress Note  10/23/2023 4:03 PM Joshua Thomas  MRN:  [id number] Subjective:  Joshua Thomas was seen in his room on rounds. He is in bed and his energy is low. He is worried about his health, that there is something wrong. Symptom wise his dental pain is improved and has no other new complaints. He hasn't really been out of the room today. His motivation is low. No hallucinations, thoughts of harm to self or others.   Principal Problem: Opioid use disorder, severe, dependence (HCC) Diagnosis: Principal Problem:   Opioid use disorder, severe, dependence (HCC) Active Problems:   GERD (gastroesophageal reflux disease)   Essential hypertension   MDD (major depressive disorder), recurrent severe, without psychosis (HCC)   Substance induced mood disorder (HCC)  Total Time spent with patient: 20 minutes  Past Psychiatric History: Previous psych diagnoses: ADHD, MDD, PTSD, OUD, stimulant use disorder, alchol use disorder Prior inpatient psychiatric treatment: several Prior outpatient psychiatric treatment: endorses Current psychiatric provider: [facility] Current therapist:denies  Past Medical History:  Past Medical History:  Diagnosis Date   Acute on chronic osteomyelitis (HCC) 07/04/2021   Acute osteomyelitis of lumbar spine (HCC) 05/17/2017   ADHD (attention deficit hyperactivity disorder)    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   Bipolar 1 disorder (HCC)    Chronic back pain    Chronic pain    chronic l leg pain   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   COPD (chronic obstructive pulmonary disease) (HCC)    DDD (degenerative disc disease), lumbar    Degenerative disc disease, lumbar    Depression    Diabetes mellitus without complication (HCC)    Discitis    Epidural abscess 10/08/2021   GERD (gastroesophageal reflux disease)    Hepatitis C    Hepatitis C    History of kidney stones    History of stomach ulcers    Hx of diabetes mellitus    due  to infection   Hypertension    Lung nodules    3 on L and 2 on R   Osteomyelitis of lumbar spine (HCC) 10/13/2022   Polysubstance abuse (HCC) 09/03/2022   PTSD (post-traumatic stress disorder)    Sciatica    Substance abuse (HCC)     Past Surgical History:  Procedure Laterality Date   FRACTURE SURGERY     HARDWARE REMOVAL  01/27/2012   Procedure: HARDWARE REMOVAL;  Surgeon: Lonni CINDERELLA Poli, MD;  Location: [facility];  Service: Orthopedics;  Laterality: Left;   IR LUMBAR DISC ASPIRATION W/IMG GUIDE  06/08/2017   IR LUMBAR DISC ASPIRATION W/IMG GUIDE  07/06/2021   LAPAROSCOPIC LYSIS OF ADHESIONS  05/24/2022   Procedure: LAPAROSCOPIC LYSIS OF ADHESIONS;  Surgeon: Mavis Anes, MD;  Location: [facility];  Service: General;;   RADIOLOGY WITH ANESTHESIA N/A 06/08/2017   Procedure: DISC ASPIRATION;  Surgeon: Dolphus Carrion, MD;  Location: [facility];  Service: Radiology;  Laterality: N/A;   TIBIA IM NAIL INSERTION  01/27/2012   Procedure: INTRAMEDULLARY (IM) NAIL TIBIAL;  Surgeon: Lonni CINDERELLA Poli, MD;  Location: [facility];  Service: Orthopedics;  Laterality: Left;  Removal of IM Rod and Screws Left Tibia with Exchange Nail, Allograft Bone Graft Left Tibia   XI ROBOTIC ASSISTED INGUINAL HERNIA REPAIR WITH MESH Right 05/24/2022   Procedure: XI ROBOTIC ASSISTED INGUINAL HERNIA REPAIR WITH MESH;  Surgeon: Mavis Anes, MD;  Location: [facility];  Service: General;  Laterality: Right;   Family History:  Family History  Problem Relation Age of Onset   Diabetes Mother    Alcohol abuse Father    Cirrhosis Father    Aneurysm Father    Family Psychiatric  History: see H&P Social History:  Social History   Substance and Sexual Activity  Alcohol Use Not Currently   Alcohol/week: 1.0 standard drink of alcohol   Types: 1 Standard drinks or equivalent per week   Comment: last used July 2023     Social History   Substance and Sexual Activity  Drug Use Not Currently   Types: IV, Fentanyl ,  Benzodiazepines   Comment: last used July 2023    Social History   Socioeconomic History   Marital status: Single    Spouse name: Not on file   Number of children: Not on file   Years of education: 12   Highest education level: High school graduate  Occupational History   Not on file  Tobacco Use   Smoking status: Every Day    Current packs/day: 1.50    Average packs/day: 1.5 packs/day for 33.0 years (49.5 ttl pk-yrs)    Types: Cigarettes   Smokeless tobacco: Former    Quit date: 08/08/2006   Tobacco comments:    1 PPD  Vaping Use   Vaping status: Every Day  Substance and Sexual Activity   Alcohol use: Not Currently    Alcohol/week: 1.0 standard drink of alcohol    Types: 1 Standard drinks or equivalent per week    Comment: last used July 2023   Drug use: Not Currently    Types: IV, Fentanyl , Benzodiazepines    Comment: last used July 2023   Sexual activity: Not Currently  Other Topics Concern   Not on file  Social History Narrative   Not on file   Social Drivers of Health   Financial Resource Strain: High Risk (09/23/2017)   Overall Financial Resource Strain (CARDIA)    Difficulty of Paying Living Expenses: Very hard  Food Insecurity: Food Insecurity Present (10/15/2023)   Hunger Vital Sign    Worried About Running Out of Food in the Last Year: Sometimes true    Ran Out of Food in the Last Year: Sometimes true  Transportation Needs: Unmet Transportation Needs (10/15/2023)   PRAPARE - Transportation    Lack of Transportation (Medical): Yes    Lack of Transportation (Non-Medical): Yes  Physical Activity: Unknown (09/23/2017)   Exercise Vital Sign    Days of Exercise per Week: Patient declined    Minutes of Exercise per Session: Patient declined  Stress: Stress Concern Present (09/23/2017)   [employer] of Occupational Health - Occupational Stress Questionnaire    Feeling of Stress : Very much  Social Connections: Socially Isolated (09/23/2017)   Social  Connection and Isolation Panel    Frequency of Communication with Friends and Family: Once a week    Frequency of Social Gatherings with Friends and Family: Once a week    Attends Religious Services: Never    Database administrator or Organizations: No    Attends Engineer, structural: Never    Marital Status: Never married   Additional Social History:                         Sleep: increased Estimated Sleeping Duration (Last 24 Hours): 5.75-6.75 hours  Appetite:  Good  Current Medications: Current Facility-Administered Medications  Medication Dose Route Frequency Provider Last Rate Last Admin   alum & mag hydroxide-simeth (MAALOX/MYLANTA) 200-200-20  MG/5ML suspension 30 mL  30 mL Oral Q4H PRN Kline, Kyra A, NP       amoxicillin -clavulanate (AUGMENTIN ) 875-125 MG per tablet 1 tablet  1 tablet Oral Q12H Bennett, Christal H, NP   1 tablet at 10/23/23 0942   benzocaine (ORAJEL) 10 % mucosal gel   Mouth/Throat QID PRN Bennett, Christal H, NP   Given at 10/18/23 9076   buprenorphine -naloxone  (SUBOXONE ) 8-2 mg per SL tablet 1 tablet  1 tablet Sublingual Q12H Lynnette Barter, MD   1 tablet at 10/23/23 9057   chlorhexidine  (PERIDEX ) 0.12 % solution 15 mL  15 mL Mouth/Throat BID Leigh Corean Massa, MD   15 mL at 10/23/23 0943   haloperidol  (HALDOL ) tablet 5 mg  5 mg Oral TID PRN Kline, Kyra A, NP   5 mg at 10/21/23 2106   And   diphenhydrAMINE  (BENADRYL ) capsule 50 mg  50 mg Oral TID PRN Kline, Kyra A, NP   50 mg at 10/21/23 2106   haloperidol  lactate (HALDOL ) injection 5 mg  5 mg Intramuscular TID PRN Kline, Kyra A, NP   5 mg at 10/15/23 2333   And   diphenhydrAMINE  (BENADRYL ) injection 50 mg  50 mg Intramuscular TID PRN Kline, Kyra A, NP       And   LORazepam  (ATIVAN ) injection 2 mg  2 mg Intramuscular TID PRN Kline, Kyra A, NP       haloperidol  lactate (HALDOL ) injection 10 mg  10 mg Intramuscular TID PRN Kline, Kyra A, NP       And   diphenhydrAMINE  (BENADRYL ) injection  50 mg  50 mg Intramuscular TID PRN Kline, Kyra A, NP   50 mg at 10/15/23 2333   And   LORazepam  (ATIVAN ) injection 2 mg  2 mg Intramuscular TID PRN Kline, Kyra A, NP   2 mg at 10/15/23 2333   hydrOXYzine  (ATARAX ) tablet 50 mg  50 mg Oral BID PRN Kline, Kyra A, NP   50 mg at 10/22/23 2117   insulin  aspart (novoLOG ) injection 0-9 Units  0-9 Units Subcutaneous TID WC Lynnette Barter, MD   2 Units at 10/20/23 1213   metFORMIN  (GLUCOPHAGE ) tablet 500 mg  500 mg Oral BID WC Kline, Kyra A, NP   500 mg at 10/23/23 9057   nicotine  (NICODERM CQ  - dosed in mg/24 hours) patch 21 mg  21 mg Transdermal Daily Chrisean Kloth, Corean Massa, MD   21 mg at 10/23/23 1210   polyethylene glycol (MIRALAX  / GLYCOLAX ) packet 17 g  17 g Oral Daily Trudy Carwin, NP   17 g at 10/22/23 0800   QUEtiapine  (SEROQUEL ) tablet 100 mg  100 mg Oral QHS Kline, Kyra A, NP   100 mg at 10/22/23 2117   senna-docusate (Senokot-S) tablet 1 tablet  1 tablet Oral BID Trudy Carwin, NP   1 tablet at 10/23/23 9057   sertraline  (ZOLOFT ) tablet 50 mg  50 mg Oral Daily Ji, Andrew, MD   50 mg at 10/23/23 9057    Lab Results:  Results for orders placed or performed during the hospital encounter of 10/15/23 (from the past 48 hours)  Glucose, capillary     Status: Abnormal   Collection Time: 10/21/23  4:59 PM  Result Value Ref Range   Glucose-Capillary 165 (H) 70 - 99 mg/dL    Comment: Glucose reference range applies only to samples taken after fasting for at least 8 hours.   Comment 1 Notify RN    Comment 2 Document in Chart  Glucose, capillary     Status: Abnormal   Collection Time: 10/21/23  7:23 PM  Result Value Ref Range   Glucose-Capillary 202 (H) 70 - 99 mg/dL    Comment: Glucose reference range applies only to samples taken after fasting for at least 8 hours.  Glucose, capillary     Status: Abnormal   Collection Time: 10/22/23  5:57 AM  Result Value Ref Range   Glucose-Capillary 131 (H) 70 - 99 mg/dL    Comment: Glucose reference range  applies only to samples taken after fasting for at least 8 hours.  Glucose, capillary     Status: Abnormal   Collection Time: 10/23/23  6:14 AM  Result Value Ref Range   Glucose-Capillary 124 (H) 70 - 99 mg/dL    Comment: Glucose reference range applies only to samples taken after fasting for at least 8 hours.  Glucose, capillary     Status: Abnormal   Collection Time: 10/23/23 12:08 PM  Result Value Ref Range   Glucose-Capillary 119 (H) 70 - 99 mg/dL    Comment: Glucose reference range applies only to samples taken after fasting for at least 8 hours.    Blood Alcohol level:  Lab Results  Component Value Date   [facility] <15 10/15/2023   ETH <10 09/03/2022    Metabolic Disorder Labs: Lab Results  Component Value Date   HGBA1C 6.7 (H) 10/17/2023   MPG 145.59 10/17/2023   MPG 162.81 09/04/2022   Lab Results  Component Value Date   PROLACTIN 31.3 (H) 05/23/2015   Lab Results  Component Value Date   CHOL 138 10/17/2023   TRIG 66 10/17/2023   HDL 30 (L) 10/17/2023   CHOLHDL 4.6 10/17/2023   VLDL 13 10/17/2023   LDLCALC 95 10/17/2023   LDLCALC 83 12/09/2020    Physical Findings: AIMS:  ,  ,  ,  ,  ,  ,   CIWA:  CIWA-Ar Total: 3 COWS:  COWS Total Score: 0  Musculoskeletal: Strength & Muscle Tone: within normal limits Gait & Station: normal Patient leans: N/A  Psychiatric Specialty Exam:  Presentation  General Appearance:  Casual  Eye Contact: Good  Speech: Normal Rate  Speech Volume: Normal  Handedness: Right   Mood and Affect  Mood: Euthymic  Affect: Blunt   Thought Process  Thought Processes: Goal Directed  Descriptions of Associations:Intact  Orientation:Full (Time, Place and Person)  Thought Content:Logical  History of Schizophrenia/Schizoaffective disorder:No data recorded Duration of Psychotic Symptoms:No data recorded Hallucinations:Hallucinations: None  Ideas of Reference:None  Suicidal Thoughts:Suicidal Thoughts:  No  Homicidal Thoughts:Homicidal Thoughts: No   Sensorium  Memory: Immediate Good; Recent Good; Remote Fair  Judgment: Fair  Insight: Fair   Art therapist  Concentration: Good  Attention Span: Good  Recall: Good  Fund of Knowledge: Good  Language: Good   Psychomotor Activity  Psychomotor Activity: Psychomotor Activity: Normal   Assets  Assets: Communication Skills; Leisure Time   Sleep  Sleep: Sleep: Good    Physical Exam: Physical Exam Vitals and nursing note reviewed.  HENT:     Head: Normocephalic and atraumatic.  Eyes:     Extraocular Movements: Extraocular movements intact.  Pulmonary:     Effort: Pulmonary effort is normal.     Breath sounds: Normal breath sounds.  Musculoskeletal:        General: Normal range of motion.     Cervical back: Normal range of motion.  Neurological:     General: No focal deficit present.  Mental Status: He is oriented to person, place, and time.  Psychiatric:        Behavior: Behavior normal.    Review of Systems  Constitutional:  Positive for malaise/fatigue.  HENT:         Dental pain  Gastrointestinal:  Negative for nausea and vomiting.  Neurological:  Negative for tremors.  Psychiatric/Behavioral:  Negative for hallucinations and suicidal ideas. The patient is nervous/anxious.    Blood pressure 102/85, pulse 65, temperature 98.1 F (36.7 C), temperature source Oral, resp. rate 18, height 5' 3 (1.6 m), weight 62.5 kg, SpO2 100%. Body mass index is 24.41 kg/m.   Treatment Plan Summary: Daily contact with patient to assess and evaluate symptoms and progress in treatment and Medication management   PLAN: Safety and Monitoring:             -- Voluntary admission to inpatient psychiatric unit for safety, stabilization and treatment             -- Daily contact with patient to assess and evaluate symptoms and progress in treatment             -- Patient's case to be discussed in  multi-disciplinary team meeting             -- Observation Level : q15 minute checks             -- Vital signs: q12 hours             -- Precautions: suicide, elopement, and assault   2. Psychiatric Problems #Opioid use disorder, severe dependence #Substance-induced mood disorder #Historical diagnosis of ADHD - Discontinue clonidine  taper per patient's preference (10/8) - COWS  - Suboxone  8-2 twice daily - Seroquel  100 mg nightly - Sertraline  50 mg daily - Opiate withdrawal PRNs ordered including Zofran , Imodium , Bentyl , Maalox   Other - Augmentin  875/125 mg twice daily x 7 days, oral/facial swelling 10/8 -PRNs: maalox, milk of magnesia, hydroxyzine , trazodone  -- As needed agitation protocol in-place   3. Medical Problems Type 2 diabetes mellitus -SSI sensitive with meals -augmentin  875-124mg  PO Q12H x 7 days for dental infection   4. Routine and other pertinent labs: EKG monitoring: QTc: 484             -repeat EKG has bradycardia, otherwise wnl 5. Group Therapy:             -- Encouraged patient to participate in unit milieu and in scheduled group therapies              -- Short Term Goals: Ability to identify changes in lifestyle to reduce recurrence of condition, verbalize feelings, identify and develop effective coping behaviors, maintain clinical measurements within normal limits, and identify triggers associated with substance abuse/mental health issues will improve. Improvement in ability to demonstrate self-control and comply with prescribed medications.             -- Long Term Goals: Improvement in symptoms so as ready for discharge -- Patient is encouraged to participate in group therapy while admitted to the psychiatric unit. -- We will address other chronic and acute stressors, which contributed to the patient's Opioid use disorder, severe, dependence (HCC) in order to reduce the risk of self-harm at discharge.   6. Discharge Planning:              -- Social work  and case management to assist with discharge planning and identification of hospital follow-up needs prior to discharge             --  Estimated LOS: 5-7 days             -- Discharge Concerns: Need to establish a safety plan; Medication compliance and effectiveness             -- Discharge Goals: Return home with outpatient referrals for mental health follow-up including medication management/psychotherapy   I certify that inpatient services furnished can reasonably be expected to improve the patient's condition.  Corean Anette Potters, MD 10/23/2023, 4:03 PM

## 2023-10-23 NOTE — Group Note (Unsigned)
 Date:  10/23/2023 Time:  8:51 PM  Group Topic/Focus:  Wrap-Up Group:   The focus of this group is to help patients review their daily goal of treatment and discuss progress on daily workbooks.     Participation Level:  {B[facility] PARTICIPATION OZCZO:77735}  Participation Quality:  {BHH PARTICIPATION QUALITY:22265}  Affect:  {BHH AFFECT:22266}  Cognitive:  {BHH COGNITIVE:22267}  Insight: {BHH Insight2:20797}  Engagement in Group:  {B[facility] ENGAGEMENT IN HMNLE:77731}  Modes of Intervention:  {BHH MODES OF INTERVENTION:22269}  Additional Comments:  ***  Gwenn Nobie Brooklyn 10/23/2023, 8:51 PM

## 2023-10-23 NOTE — Progress Notes (Signed)
(  Sleep Hours) - 10.5 hours (Any PRNs that were needed, meds refused, or side effects to meds)-  Vistaril  50 mg (Any disturbances and when (visitation, over night)- None (Concerns raised by the patient)-  None (SI/HI/AVH)-  Denies

## 2023-10-24 LAB — GLUCOSE, CAPILLARY: Glucose-Capillary: 132 mg/dL — ABNORMAL HIGH (ref 70–99)

## 2023-10-24 MED ORDER — TRAZODONE HCL 50 MG PO TABS: 50.0000 mg | ORAL_TABLET | Freq: Every evening | ORAL | Status: DC | PRN

## 2023-10-24 MED ORDER — IBUPROFEN 800 MG PO TABS: 800.0000 mg | ORAL_TABLET | Freq: Once | ORAL | Status: DC

## 2023-10-24 NOTE — Group Note (Signed)
 Recreation Therapy Group Note   Group Topic:Communication  Group Date: 10/24/2023 Start Time: 0935 End Time: 1000 Facilitators: Lucinda Spells-McCall, LRT,CTRS Location: [address] Dayroom   Group Topic: Communication, Problem Solving   Goal Area(s) Addresses:  Patient will effectively listen to complete activity.  Patient will identify communication skills used to make activity successful.  Patient will identify how skills used during activity can be used to reach post d/c goals.    Behavioral Response: Distracted  Intervention: Building surveyor Activity - Geometric pattern cards, pencils, blank paper    Activity: Geometric Drawings.  Three volunteers from the peer group will be shown an abstract picture with a particular arrangement of geometrical shapes.  Each round, one 'speaker' will describe the pattern, as accurately as possible without revealing the image to the group.  The remaining group members will listen and draw the picture to reflect how it is described to them. Patients with the role of 'listener' cannot ask clarifying questions but, may request that the speaker repeat a direction. Once the drawings are complete, the presenter will show the rest of the group the picture and compare how close each person came to drawing the picture. LRT will facilitate a post-activity discussion regarding effective communication and the importance of planning, listening, and asking for clarification in daily interactions with others.  Education: Environmental consultant, Active listening, Support systems, Discharge planning  Education Outcome: Acknowledges understanding/In group clarification offered/Needs additional education.    Affect/Mood: Appropriate   Participation Level: None   Participation Quality: None   Behavior: Disinterested   Speech/Thought Process: Distracted   Insight: None   Judgement: None   Modes of Intervention: Activity   Patient Response to  Interventions:  Disengaged   Education Outcome:  In group clarification offered    Clinical Observations/Individualized Feedback: Pt came in late to group. Pt didn't participate in activity. Pt spent most of his time looking out the window towards the nurses station. Pt would look at the pictures peers drew in comparison the pictures that were described to them.     Plan: Continue to engage patient in RT group sessions 2-3x/week.   Joshua Thomas, LRT,CTRS 10/24/2023 11:06 AM

## 2023-10-24 NOTE — [facility] Group Notes (Signed)
 Spirituality Group   Description: Participant directed exploration of values, beliefs and meaning **Focus on sources of hope & possibility from our own experiences  Following a brief framework of chaplain's role and ground rules of group behavior, participants are invited to share concerns or questions that engage spiritual life. Emphasis placed on common themes and shared experiences and ways to make meaning and clarify living into one's values.   Theory/Process/Goal: Utilize the theoretical framework of group therapy established by Celena Kite, Relational Cultural Theory and Rogerian approaches to facilitate relational empathy and use of the "here and now" to foster reflection, self-awareness, and sharing.   Observations: Glyn was present for about 0.75 of the group. He was reserved but engaged in the group discussion.  Geronimo Diliberto L. Delores HERO.Div

## 2023-10-24 NOTE — Progress Notes (Signed)
(  Sleep Hours) - 7 hours (Any PRNs that were needed, meds refused, or side effects to meds)-  Vistaril  50 mg given (Any disturbances and when (visitation, over night)- None (Concerns raised by the patient)-  None (SI/HI/AVH)-  Denies

## 2023-10-24 NOTE — [facility] Group Notes (Signed)
 [facility] Group Notes:  (Nursing/MHT/Case Management/Adjunct)  Date:  10/24/2023  Time:  9:26 AM  Type of Therapy:  Group Therapy  Participation Level:  Active  Participation Quality:  Appropriate  Affect:  Appropriate  Cognitive:  Appropriate  Insight:  Appropriate  Engagement in Group:  Engaged  Modes of Intervention:  Orientation  Summary of Progress/Problems: Pt goal is to figure out most logical/safe next step.   Joshua Thomas, LRT,CTRS 10/24/2023, 9:26 AM

## 2023-10-24 NOTE — Progress Notes (Signed)
   10/24/23 1000  Psych Admission Type (Psych Patients Only)  Admission Status Voluntary  Psychosocial Assessment  Patient Complaints Anxiety  Eye Contact Fair  Facial Expression Anxious  Affect Anxious  Speech Logical/coherent  Interaction Assertive  Motor Activity Slow  Appearance/Hygiene Unremarkable  Behavior Characteristics Appropriate to situation  Mood Anxious  Thought Process  Coherency WDL  Content WDL  Delusions None reported or observed  Perception WDL  Hallucination None reported or observed  Judgment Poor  Confusion None  Danger to Self  Current suicidal ideation? Denies  Description of Suicide Plan No Plan  Agreement Not to Harm Self Yes  Description of Agreement Verbal  Danger to Others  Danger to Others None reported or observed

## 2023-10-24 NOTE — Progress Notes (Signed)
(  Sleep Hours) -  (Any PRNs that were needed, meds refused, or side effects to meds)- hydroxyzine  50mg   (Any disturbances and when (visitation, over night)-none  (Concerns raised by the patient)- needs pain med for bilat feet pain  (SI/HI/AVH)-denies

## 2023-10-24 NOTE — Plan of Care (Signed)
   Problem: Education: Goal: Emotional status will improve Outcome: Progressing Goal: Mental status will improve Outcome: Progressing Goal: Verbalization of understanding the information provided will improve Outcome: Progressing   Problem: Activity: Goal: Interest or engagement in activities will improve Outcome: Progressing

## 2023-10-24 NOTE — Progress Notes (Signed)
 [facility] MD Progress Note  10/24/2023 10:31 AM Joshua Thomas  MRN:  [id number]   Principal Problem: Opioid use disorder, severe, dependence (HCC) Diagnosis: Principal Problem:   Opioid use disorder, severe, dependence (HCC) Active Problems:   GERD (gastroesophageal reflux disease)   Essential hypertension   MDD (major depressive disorder), recurrent severe, without psychosis (HCC)   Substance induced mood disorder (HCC)  Reason for Admission: Joshua Thomas is a 48 y.o., male with history of opioid use disorder, substance-induced mood disorder, stimulant use disorder-amphetamine type, homelessness, COPD, hepatitis C, hypertension, diabetes, IV drug use complicated by osteomyelitis presents voluntarily to behavioral health hospital due to suicidal ideation with plan to overdose on fentanyl .   24-hour Chart Review: Chart reviewed. Patient's case discussed in interdisciplinary team meeting.  Vital signs reviewed without critical value. As needed hydroxyzine  required overnight.  He slept a documented 7.0 hours.  He is adherent to taking psychotropic medication regimen. Nursing notes indicate no behavioral challenges on the unit.   Daily Evaluation: Joshua Thomas was seen in his room during rounds today. He reports that his mood is euthymic and has improved since admission, remaining stable. He denies feeling down, depressed, or sad. Anxiety symptoms are described as manageable. Sleep and appetite are stable. He reports adequate energy and concentration without complaint. Denies suicidal thoughts, intent, or plan, as well as homicidal ideation or psychotic symptoms. He also denies experiencing any side effects from his current psychiatric medications.  Discharge planning was discussed, with emphasis on the importance of medication adherence and attending follow-up appointments to prevent symptom exacerbation. Psychosocial stressors were reviewed, including recent relapse on substances. The patient was counseled on  the critical importance of complete abstinence from alcohol and other substances to support ongoing recovery. Education was provided on how abstaining can improve mood stability, cognitive functioning, sleep, and anxiety levels.  The patient denies any current withdrawal symptoms or cravings. He expressed interest in substance use treatment but stated he would like to be discharged first because his car, located in the emergency department parking lot across the street, holds sentimental value, as it was a gift from his mother. He reports plans to follow up with the Suboxone  clinic after discharge and is also interested in participating in outpatient therapy services.  Overall, the patient demonstrates a stable, improved mood and manageable anxiety. He denies suicidal or homicidal ideation, psychosis, or medication side effects. Insight and motivation toward recovery are present. No withdrawal symptoms or cravings are reported. The patient remains future-oriented and engaged in treatment planning. He is anticipated to discharge on October 14, with social work finalizing aftercare arrangements.  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   Bipolar 1 disorder (HCC)    Chronic back pain    Chronic pain    chronic l leg pain   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   COPD (chronic obstructive pulmonary disease) (HCC)    DDD (degenerative  disc disease), lumbar    Degenerative disc disease, lumbar    Depression    Diabetes  mellitus without complication (HCC)    Discitis    Epidural abscess 10/08/2021   GERD (gastroesophageal reflux disease)    Hepatitis C    Hepatitis C    History of kidney stones    History of stomach ulcers    Hx of diabetes mellitus    due to infection   Hypertension    Lung nodules    3 on L and 2 on R   Osteomyelitis of lumbar spine (HCC) 10/13/2022   Polysubstance abuse (HCC) 09/03/2022   PTSD (post-traumatic stress disorder)    Sciatica    Substance abuse [facility])     Past Surgical History:  Procedure Laterality Date   FRACTURE SURGERY     HARDWARE REMOVAL  01/27/2012   Procedure: HARDWARE REMOVAL;  Surgeon: Lonni CINDERELLA Poli, MD;  Location: [facility];  Service: Orthopedics;  Laterality: Left;   IR LUMBAR DISC ASPIRATION W/IMG GUIDE  06/08/2017   IR LUMBAR DISC ASPIRATION W/IMG GUIDE  07/06/2021   LAPAROSCOPIC LYSIS OF ADHESIONS  05/24/2022   Procedure: LAPAROSCOPIC LYSIS OF ADHESIONS;  Surgeon: Mavis Anes, MD;  Location: [facility];  Service: General;;   RADIOLOGY WITH ANESTHESIA N/A 06/08/2017   Procedure: DISC ASPIRATION;  Surgeon: Dolphus Carrion, MD;  Location: [facility] OR;  Service: Radiology;  Laterality: N/A;   TIBIA IM NAIL INSERTION  01/27/2012   Procedure: INTRAMEDULLARY (IM) NAIL TIBIAL;  Surgeon: Lonni CINDERELLA Poli, MD;  Location: [facility];  Service: Orthopedics;  Laterality: Left;  Removal of IM Rod and Screws Left Tibia with Exchange Nail, Allograft Bone Graft Left Tibia   XI ROBOTIC ASSISTED INGUINAL HERNIA REPAIR WITH MESH Right 05/24/2022   Procedure: XI ROBOTIC ASSISTED INGUINAL HERNIA REPAIR WITH MESH;  Surgeon: Mavis Anes, MD;  Location: [facility];  Service: General;  Laterality: Right;   Family History:  Family History  Problem Relation Age of Onset   Diabetes Mother    Alcohol abuse Father    Cirrhosis Father    Aneurysm Father    Family Psychiatric  History: see  H&P Social History:  Social History   Substance and Sexual Activity  Alcohol Use Not Currently   Alcohol/week: 1.0 standard drink of alcohol   Types: 1 Standard drinks or equivalent per week   Comment: last used July 2023     Social History   Substance and Sexual Activity  Drug Use Not Currently   Types: IV, Fentanyl , Benzodiazepines   Comment: last used July 2023    Social History   Socioeconomic History   Marital status: Single    Spouse name: Not on file   Number of children: Not on file   Years of education: 12   Highest education level: High school graduate  Occupational History   Not on file  Tobacco Use   Smoking status: Every Day    Current packs/day: 1.50    Average packs/day: 1.5 packs/day for 33.0 years (49.5 ttl pk-yrs)    Types: Cigarettes   Smokeless tobacco: Former    Quit date: 08/08/2006   Tobacco comments:    1 PPD  Vaping Use   Vaping status: Every Day  Substance and Sexual Activity   Alcohol use: Not Currently    Alcohol/week: 1.0 standard drink of alcohol    Types: 1 Standard drinks or equivalent per week    Comment: last used July 2023   Drug use: Not Currently    Types: IV, Fentanyl , Benzodiazepines    Comment: last used July 2023   Sexual  activity: Not Currently  Other Topics Concern   Not on file  Social History Narrative   Not on file   Social Drivers of Health   Financial Resource Strain: High Risk (09/23/2017)   Overall Financial Resource Strain (CARDIA)    Difficulty of Paying Living Expenses: Very hard  Food Insecurity: Food Insecurity Present (10/15/2023)   Hunger Vital Sign    Worried About Running Out of Food in the Last Year: Sometimes true    Ran Out of Food in the Last Year: Sometimes true  Transportation Needs: Unmet Transportation Needs (10/15/2023)   PRAPARE - Administrator, Civil Service (Medical): Yes    Lack of Transportation (Non-Medical): Yes  Physical Activity: Unknown (09/23/2017)   Exercise Vital  Sign    Days of Exercise per Week: Patient declined    Minutes of Exercise per Session: Patient declined  Stress: Stress Concern Present (09/23/2017)   [employer] of Occupational Health - Occupational Stress Questionnaire    Feeling of Stress : Very much  Social Connections: Socially Isolated (09/23/2017)   Social Connection and Isolation Panel    Frequency of Communication with Friends and Family: Once a week    Frequency of Social Gatherings with Friends and Family: Once a week    Attends Religious Services: Never    Database administrator or Organizations: No    Attends Engineer, structural: Never    Marital Status: Never married   Additional Social History:                         Current Medications: Current Facility-Administered Medications  Medication Dose Route Frequency Provider Last Rate Last Admin   alum & mag hydroxide-simeth (MAALOX/MYLANTA) 200-200-20 MG/5ML suspension 30 mL  30 mL Oral Q4H PRN Kline, Kyra A, NP       amoxicillin -clavulanate (AUGMENTIN ) 875-125 MG per tablet 1 tablet  1 tablet Oral Q12H Martyn Timme H, NP   1 tablet at 10/24/23 0804   benzocaine (ORAJEL) 10 % mucosal gel   Mouth/Throat QID PRN Blair Robin H, NP   Given at 10/18/23 9076   buprenorphine -naloxone  (SUBOXONE ) 8-2 mg per SL tablet 1 tablet  1 tablet Sublingual Q12H Lynnette Barter, MD   1 tablet at 10/24/23 0804   chlorhexidine  (PERIDEX ) 0.12 % solution 15 mL  15 mL Mouth/Throat BID Leigh Corean Massa, MD   15 mL at 10/24/23 0803   haloperidol  (HALDOL ) tablet 5 mg  5 mg Oral TID PRN Kline, Kyra A, NP   5 mg at 10/21/23 2106   And   diphenhydrAMINE  (BENADRYL ) capsule 50 mg  50 mg Oral TID PRN Kline, Kyra A, NP   50 mg at 10/21/23 2106   haloperidol  lactate (HALDOL ) injection 5 mg  5 mg Intramuscular TID PRN Kline, Kyra A, NP   5 mg at 10/15/23 2333   And   diphenhydrAMINE  (BENADRYL ) injection 50 mg  50 mg Intramuscular TID PRN Kline, Kyra A, NP       And    LORazepam  (ATIVAN ) injection 2 mg  2 mg Intramuscular TID PRN Kline, Kyra A, NP       haloperidol  lactate (HALDOL ) injection 10 mg  10 mg Intramuscular TID PRN Kline, Kyra A, NP       And   diphenhydrAMINE  (BENADRYL ) injection 50 mg  50 mg Intramuscular TID PRN Kline, Kyra A, NP   50 mg at 10/15/23 2333   And   LORazepam  (  ATIVAN ) injection 2 mg  2 mg Intramuscular TID PRN Kline, Kyra A, NP   2 mg at 10/15/23 2333   hydrOXYzine  (ATARAX ) tablet 50 mg  50 mg Oral BID PRN Kline, Kyra A, NP   50 mg at 10/23/23 2052   insulin  aspart (novoLOG ) injection 0-9 Units  0-9 Units Subcutaneous TID WC Ji, Andrew, MD   2 Units at 10/20/23 1213   metFORMIN  (GLUCOPHAGE ) tablet 500 mg  500 mg Oral BID WC Kline, Kyra A, NP   500 mg at 10/24/23 0804   nicotine  (NICODERM CQ  - dosed in mg/24 hours) patch 21 mg  21 mg Transdermal Daily Leigh Corean Massa, MD   21 mg at 10/24/23 0807   polyethylene glycol (MIRALAX  / GLYCOLAX ) packet 17 g  17 g Oral Daily Trudy Carwin, NP   17 g at 10/22/23 0800   QUEtiapine  (SEROQUEL ) tablet 100 mg  100 mg Oral QHS Kline, Kyra A, NP   100 mg at 10/23/23 2052   senna-docusate (Senokot-S) tablet 1 tablet  1 tablet Oral BID Trudy Carwin, NP   1 tablet at 10/24/23 9195   sertraline  (ZOLOFT ) tablet 50 mg  50 mg Oral Daily Lynnette Barter, MD   50 mg at 10/24/23 9195    Lab Results:  Results for orders placed or performed during the hospital encounter of 10/15/23 (from the past 48 hours)  Glucose, capillary     Status: Abnormal   Collection Time: 10/23/23  6:14 AM  Result Value Ref Range   Glucose-Capillary 124 (H) 70 - 99 mg/dL    Comment: Glucose reference range applies only to samples taken after fasting for at least 8 hours.  Glucose, capillary     Status: Abnormal   Collection Time: 10/23/23 12:08 PM  Result Value Ref Range   Glucose-Capillary 119 (H) 70 - 99 mg/dL    Comment: Glucose reference range applies only to samples taken after fasting for at least 8 hours.  Glucose,  capillary     Status: Abnormal   Collection Time: 10/23/23  5:24 PM  Result Value Ref Range   Glucose-Capillary 145 (H) 70 - 99 mg/dL    Comment: Glucose reference range applies only to samples taken after fasting for at least 8 hours.    Blood Alcohol level:  Lab Results  Component Value Date   [facility] <15 10/15/2023   ETH <10 09/03/2022    Metabolic Disorder Labs: Lab Results  Component Value Date   HGBA1C 6.7 (H) 10/17/2023   MPG 145.59 10/17/2023   MPG 162.81 09/04/2022   Lab Results  Component Value Date   PROLACTIN 31.3 (H) 05/23/2015   Lab Results  Component Value Date   CHOL 138 10/17/2023   TRIG 66 10/17/2023   HDL 30 (L) 10/17/2023   CHOLHDL 4.6 10/17/2023   VLDL 13 10/17/2023   LDLCALC 95 10/17/2023   LDLCALC 83 12/09/2020    Physical Findings: AIMS:  ,  ,  ,  ,  ,  ,   CIWA:  CIWA-Ar Total: 3 COWS:  COWS Total Score: 0  Musculoskeletal: Strength & Muscle Tone: within normal limits Gait & Station: normal Patient leans: N/A  Psychiatric Specialty Exam:  Presentation  General Appearance:  Casual  Eye Contact: Good  Speech: Normal Rate  Speech Volume: Normal  Handedness: Right   Mood and Affect  Mood: Euthymic  Affect: Blunt   Thought Process  Thought Processes: Goal Directed  Descriptions of Associations:Intact  Orientation:Full (Time, Place and Person)  Thought Content:Logical  History of Schizophrenia/Schizoaffective disorder:No data recorded Duration of Psychotic Symptoms:No data recorded Hallucinations:Hallucinations: None  Ideas of Reference:None  Suicidal Thoughts:Suicidal Thoughts: No  Homicidal Thoughts:Homicidal Thoughts: No   Sensorium  Memory: Immediate Good; Recent Good; Remote Fair  Judgment: Fair  Insight: Fair   Art therapist  Concentration: Good  Attention Span: Good  Recall: Good  Fund of Knowledge: Good  Language: Good   Psychomotor Activity  Psychomotor  Activity: Psychomotor Activity: Normal   Assets  Assets: Communication Skills; Leisure Time   Sleep  Sleep: Sleep: Good    Physical Exam: Physical Exam Vitals and nursing note reviewed.  HENT:     Head: Normocephalic and atraumatic.  Eyes:     Extraocular Movements: Extraocular movements intact.  Pulmonary:     Effort: Pulmonary effort is normal.     Breath sounds: Normal breath sounds.  Musculoskeletal:        General: Normal range of motion.     Cervical back: Normal range of motion.  Neurological:     General: No focal deficit present.     Mental Status: He is oriented to person, place, and time.  Psychiatric:        Behavior: Behavior normal.    Review of Systems  Constitutional:  Positive for malaise/fatigue.  HENT:         Dental pain  Gastrointestinal:  Negative for nausea and vomiting.  Neurological:  Negative for tremors.  Psychiatric/Behavioral:  Negative for hallucinations and suicidal ideas. The patient is nervous/anxious.    Blood pressure 119/85, pulse 68, temperature 98.2 F (36.8 C), temperature source Oral, resp. rate 18, height 5' 3 (1.6 m), weight 62.5 kg, SpO2 100%. Body mass index is 24.41 kg/m.   Treatment Plan Summary: Daily contact with patient to assess and evaluate symptoms and progress in treatment and Medication management   10/13: Mood stable and improved. No evidence of depression, mania, or psychosis. Insight and motivation are good. Patient is appropriate for discharge with follow-up at a Suboxone  clinic and outpatient therapy.  Continue current medication regimen.  PLAN: Safety and Monitoring:             -- Voluntary admission to inpatient psychiatric unit for safety, stabilization and treatment             -- Daily contact with patient to assess and evaluate symptoms and progress in treatment             -- Patient's case to be discussed in multi-disciplinary team meeting             -- Observation Level : q15 minute  checks             -- Vital signs: q12 hours             -- Precautions: suicide, elopement, and assault   2. Psychiatric Problems #Opioid use disorder, severe dependence #Substance-induced mood disorder #Historical diagnosis of ADHD - Discontinue clonidine  taper per patient's preference (10/8) - COWS  - Suboxone  8-2 twice daily - Seroquel  100 mg nightly - Sertraline  50 mg daily - Opiate withdrawal PRNs ordered including Zofran , Imodium , Bentyl , Maalox     3. Medical Problems Type 2 diabetes mellitus -SSI sensitive with meals -augmentin  875-124mg  PO Q12H x 7 days for dental infection   4. Routine and other pertinent labs: EKG monitoring: QTc: 484             -repeat EKG has bradycardia, otherwise wnl 5. Group Therapy:             --  Encouraged patient to participate in unit milieu and in scheduled group therapies              -- Short Term Goals: Ability to identify changes in lifestyle to reduce recurrence of condition, verbalize feelings, identify and develop effective coping behaviors, maintain clinical measurements within normal limits, and identify triggers associated with substance abuse/mental health issues will improve. Improvement in ability to demonstrate self-control and comply with prescribed medications.             -- Long Term Goals: Improvement in symptoms so as ready for discharge -- Patient is encouraged to participate in group therapy while admitted to the psychiatric unit. -- We will address other chronic and acute stressors, which contributed to the patient's Opioid use disorder, severe, dependence (HCC) in order to reduce the risk of self-harm at discharge.   6. Discharge Planning:              -- Social work and case management to assist with discharge planning and identification of hospital follow-up needs prior to discharge             -- Estimated LOS: 5-7 days             -- Discharge Concerns: Need to establish a safety plan; Medication compliance and  effectiveness             -- Discharge Goals: Return home with outpatient referrals for mental health follow-up including medication management/psychotherapy   I certify that inpatient services furnished can reasonably be expected to improve the patient's condition.  Blair Chiquita Hint, NP 10/24/2023, 10:31 AM Patient ID: Joshua Thomas, male   DOB: [birth year], 48 y.o.   MRN: [id number]

## 2023-10-24 NOTE — BHH Group Notes (Signed)
 The focus of this group is to help patients review their daily goal of treatment and discuss progress on daily workbooks. Pt was attentive and appropriate during tonight's wrap up group. Pt stated that he was able to work on discharge plan. Pt stated overall good day and excited and able to talk with peers.

## 2023-10-24 NOTE — Plan of Care (Signed)
   Problem: Education: Goal: Emotional status will improve Outcome: Progressing Goal: Mental status will improve Outcome: Progressing Goal: Verbalization of understanding the information provided will improve Outcome: Progressing

## 2023-10-25 LAB — GLUCOSE, CAPILLARY: Glucose-Capillary: 145 mg/dL — ABNORMAL HIGH (ref 70–99)

## 2023-10-25 MED ORDER — HYDROXYZINE HCL 50 MG PO TABS: 50.0000 mg | ORAL_TABLET | Freq: Two times a day (BID) | ORAL | 0 refills | Status: AC | PRN

## 2023-10-25 MED ORDER — SENNOSIDES-DOCUSATE SODIUM 8.6-50 MG PO TABS: 1.0000 | ORAL_TABLET | Freq: Two times a day (BID) | ORAL | Status: AC

## 2023-10-25 MED ORDER — AMOXICILLIN-POT CLAVULANATE 875-125 MG PO TABS: 1.0000 | ORAL_TABLET | Freq: Two times a day (BID) | ORAL | 0 refills | Status: AC

## 2023-10-25 MED ORDER — QUETIAPINE FUMARATE 100 MG PO TABS: 100.0000 mg | ORAL_TABLET | Freq: Every day | ORAL | 0 refills | Status: DC

## 2023-10-25 MED ORDER — NICOTINE 21 MG/24HR TD PT24: 21.0000 mg | MEDICATED_PATCH | Freq: Every day | TRANSDERMAL | Status: AC

## 2023-10-25 MED ORDER — SERTRALINE HCL 50 MG PO TABS: 50.0000 mg | ORAL_TABLET | Freq: Every day | ORAL | 0 refills | Status: DC

## 2023-10-25 MED ORDER — CHLORHEXIDINE GLUCONATE 0.12 % MT SOLN: 15.0000 mL | Freq: Two times a day (BID) | OROMUCOSAL | Status: DC

## 2023-10-25 MED ORDER — POLYETHYLENE GLYCOL 3350 17 G PO PACK: 17.0000 g | PACK | Freq: Every day | ORAL | Status: AC

## 2023-10-25 MED ORDER — METFORMIN HCL 500 MG PO TABS: 500.0000 mg | ORAL_TABLET | Freq: Two times a day (BID) | ORAL | 0 refills | Status: AC

## 2023-10-25 MED ORDER — BENZOCAINE 10 % MT GEL: Freq: Four times a day (QID) | OROMUCOSAL | Status: AC | PRN

## 2023-10-25 MED ORDER — BUPRENORPHINE HCL-NALOXONE HCL 8-2 MG SL SUBL: 1.0000 | SUBLINGUAL_TABLET | Freq: Two times a day (BID) | SUBLINGUAL | 0 refills | Status: DC

## 2023-10-25 NOTE — Progress Notes (Signed)
  [facility] Adult Case Management Discharge Plan :  Will you be returning to the same living situation after discharge:  No. Patient will be temporarily residing with his son.  At discharge, do you have transportation home?: No. CSW set up Safe Transport ride at 11 am for patient to pick vehicle up from [employer] ED parking lot.  Do you have the ability to pay for your medications: Yes,  patient has active health insurance.  Release of information consent forms completed and in the chart;  Patient's signature needed at discharge.  Patient to Follow up at:  Follow-up Information     [facility]. Go on 10/26/2023.   Specialty: Behavioral Health Why: You have an assessment for substance use therapy services on 10/26/23 at 4:00 pm.  At this time, you can schedule an appointment for medication management services also, if you wish. Contact information: [address] [place] [address] [phone]         [facility] [employer]. Go on 10/27/2023.   Why: Please go to this provider as a walk in patient (provider request), on 10/27/23 at 8:00 am to schedule an appointment for medication management services.  You may also go Monday through Friday, from 8 am to 4 pm. Contact information: [address], [place], [place] [address] Phone: [phone]        [facility] Follow up.   Specialty: Addiction Medicine Why: Referral made Contact information: [address] [place] [address] [phone]                 Next level of care provider has access to [facility] Link:no  Safety Planning and Suicide Prevention discussed: Yes,  completed with patient. Suicide Prevention Education was reviewed thoroughly with patient, including risk factors, warning signs, and what to do. Mobile Crisis services were described and that telephone number pointed out, with encouragement to patient to put this number in personal cell  phone. Brochure was provided to patient to share with natural supports. Patient acknowledged the ways in which they are at risk, and how working through each of their issues can gradually start to reduce their risk factors. Patient was encouraged to think of the information in the context of people in their own lives. Patient denied having access to firearms Patient verbalized understanding of information provided. Patient endorsed a desire to live.      Has patient been referred to the Quitline?: Yes, faxed/e-referral on 10/14 at 9:00 am  Patient has been referred for addiction treatment: Yes, the patient will follow up with an outpatient provider for substance use disorder. Therapist: appointment made.  Louetta Lame, LCSWA 10/25/2023, 9:23 AM

## 2023-10-25 NOTE — Transportation (Signed)
 10/25/2023  Jama LELON Meyers DOB: 1975-05-01 MRN: [id number]   RIDER WAIVER AND RELEASE OF LIABILITY  For the purposes of helping with transportation needs, [facility] partners with outside transportation providers (taxi companies, [employer], Catering manager.) to give [place] [facility] patients or other approved people the choice of on-demand rides Public librarian) to our buildings for non-emergency visits.  By using [employer], I, the person signing this document, on behalf of myself and/or any legal minors (in my care using the [employer]), agree:  Science writer given to me are supplied by independent, outside transportation providers who do not work for, or have any affiliation with, [employer]. [place] is not a transportation company. [facility] has no control over the quality or safety of the rides I get using [employer]. [facility] has no control over whether any outside ride will happen on time or not. [facility] gives no guarantee on the reliability, quality, safety, or availability on any rides, or that no mistakes will happen. I know and accept that traveling by vehicle (car, truck, SVU, fleeta, bus, taxi, etc.) has risks of serious injuries such as disability, being paralyzed, and death. I know and agree the risk of using [employer] is mine alone, and not [employer]. [employer] are provided as is and as are available. The transportation providers are in charge for all inspections and care of the vehicles used to provide these rides. I agree not to take legal action against [place], its agents, employees, officers, directors, representatives, insurers, attorneys, assigns, successors, subsidiaries, and affiliates at any time for any reasons related directly or indirectly to using [employer]. I also agree not to take legal action against [facility] or its affiliates for any injury, death, or damage to property caused by or related to using  [employer]. I have read this Waiver and Release of Liability, and I understand the terms used in it and their legal meaning. This Waiver is freely and voluntarily given with the understanding that my right (or any legal minors) to legal action against [place] relating to [employer] is knowingly given up to use these services.   I attest that I read the Ride Waiver and Release of Liability to Larrell W Delarocha, gave Mr. Pauley the opportunity to ask questions and answered the questions asked (if any). I affirm that Zahi W Mohammad then provided consent for assistance with transportation.

## 2023-10-25 NOTE — Progress Notes (Signed)
 Pt discharged to lobby. Pt was stable and appreciative at that time. All papers and prescriptions were given and valuables returned. Verbal understanding expressed. Denies SI/HI and A/VH. Pt given opportunity to express concerns and ask questions.

## 2023-10-25 NOTE — [facility] Suicide Risk Assessment (Signed)
 Suicide Risk Assessment  Discharge Assessment    [name]   Principal Problem: Opioid use disorder, severe, dependence (HCC) Discharge Diagnoses: Principal Problem:   Opioid use disorder, severe, dependence (HCC) Active Problems:   GERD (gastroesophageal reflux disease)   Essential hypertension   MDD (major depressive disorder), recurrent severe, without psychosis (HCC)   Substance induced mood disorder (HCC)  Reason for Admission: Joshua Thomas is a 48 y.o., male with history of opioid use disorder, substance-induced mood disorder, stimulant use disorder-amphetamine type, homelessness, COPD, hepatitis C, hypertension, diabetes, IV drug use complicated by osteomyelitis presents voluntarily to behavioral health hospital due to suicidal ideation with plan to overdose on fentanyl .   Total Time spent with patient: 30 minutes  Musculoskeletal: Strength & Muscle Tone: within normal limits Gait & Station: normal Patient leans: N/A  Psychiatric Specialty Exam  Presentation  General Appearance:  Casual  Eye Contact: Good  Speech: Clear and Coherent; Normal Rate  Speech Volume: Normal  Handedness: Right   Mood and Affect  Mood: Euthymic  Duration of Depression Symptoms: No data recorded Affect: Appropriate; Full Range   Thought Process  Thought Processes: Coherent; Linear  Descriptions of Associations:Intact  Orientation:Full (Time, Place and Person)  Thought Content:WDL  History of Schizophrenia/Schizoaffective disorder:No data recorded Duration of Psychotic Symptoms:No data recorded Hallucinations:Hallucinations: None  Ideas of Reference:None  Suicidal Thoughts:Suicidal Thoughts: No SI Passive Intent and/or Plan: -- (Denies)  Homicidal Thoughts:Homicidal Thoughts: No   Sensorium  Memory: Immediate Good; Recent Good; Remote Good  Judgment: Intact  Insight: Present   Executive Functions   Concentration: Good  Attention Span: Good  Recall: Good  Fund of Knowledge: Good  Language: Good   Psychomotor Activity  Psychomotor Activity:Psychomotor Activity: Normal   Assets  Assets: Communication Skills; Desire for Improvement; Resilience; Transportation   Sleep  Sleep:Sleep: Good  Estimated Sleeping Duration (Last 24 Hours): 5.50-6.00 hours  Physical Exam: Physical Exam ROS Blood pressure (!) 121/91, pulse 88, temperature 97.8 F (36.6 C), temperature source Oral, resp. rate 16, height 5' 3 (1.6 m), weight 62.5 kg, SpO2 100%. Body mass index is 24.41 kg/m.  Mental Status Per Nursing Assessment::   On Admission:  Self-harm thoughts  Demographic Factors:  Caucasian and Low socioeconomic status  Loss Factors: Financial problems/change in socioeconomic status  Historical Factors: Family history of mental illness or substance abuse and Impulsivity  Risk Reduction Factors:   Positive social support, Positive therapeutic relationship, and Positive coping skills or problem solving skills  Continued Clinical Symptoms:  More than one psychiatric diagnosis Previous Psychiatric Diagnoses and Treatments  Cognitive Features That Contribute To Risk:  None    Suicide Risk:  Minimal: No identifiable suicidal ideation.  Patients presenting with no risk factors but with morbid ruminations; may be classified as minimal risk based on the severity of the depressive symptoms. Patient is future oriented and motivated towards recovery.  Stable for lower level of care.   Follow-up Information     [facility]. Go on 10/26/2023.   Specialty: Behavioral Health Why: You have an assessment for substance use therapy services on 10/26/23 at 4:00 pm.  At this time, you can schedule an appointment for medication management services also, if you wish. Contact information: [address] [place] [address] [phone]         [facility] [employer]. Go on 10/27/2023.   Why: Please go to this provider as a walk in patient (provider request), on 10/27/23 at  8:00 am to schedule an appointment for medication management services.  You may also go Monday through Friday, from 8 am to 4 pm. Contact information: [address] [place], [place] [address] Phone: [phone]        [facility] Follow up.   Specialty: Addiction Medicine Why: Referral made Contact information: [address] [place] [address] [phone]                 Plan Of Care/Follow-up recommendations:  See discharge summary.  Blair Chiquita Hint, NP 10/25/2023, 9:23 AM

## 2023-10-25 NOTE — Discharge Summary (Signed)
 Physician Discharge Summary Note  Patient:  Joshua Thomas is an 48 y.o., male MRN:  [id number] DOB:  12-10-1975 Patient phone:  [phone] (home)  Patient address:   [address] [place] [id number],  Total Time spent with patient: 30 minutes  Date of Admission:  10/15/2023 Date of Discharge: 10/25/23   Reason for Admission: [name] is a 48 y.o., male with history of opioid use disorder, substance-induced mood disorder, stimulant use disorder-amphetamine type, homelessness, COPD, hepatitis C, hypertension, diabetes, IV drug use complicated by osteomyelitis presents voluntarily to behavioral health hospital due to suicidal ideation with plan to overdose on fentanyl .   Joshua Thomas is planned for discharge home. He has been referred for outpatient medication management and counseling services. He is aware of follow-up plans and demonstrates insight into the importance of ongoing treatment and abstaining from alcohol and psychoactive substances. Joshua Thomas is future oriented and motivated toward recovery. No current safety concerns or acute psychiatric symptoms observed. No withdrawal symptoms or cravings reported.    Principal Problem: Opioid use disorder, severe, dependence (HCC) Discharge Diagnoses: Principal Problem:   Opioid use disorder, severe, dependence (HCC) Active Problems:   GERD (gastroesophageal reflux disease)   Essential hypertension   MDD (major depressive disorder), recurrent severe, without psychosis (HCC)   Substance induced mood disorder (HCC)   Past Psychiatric History: See H&P  Past Medical History:  Past Medical History:  Diagnosis Date   Acute on chronic osteomyelitis (HCC) 07/04/2021   Acute osteomyelitis of lumbar spine (HCC) 05/17/2017   ADHD (attention deficit hyperactivity disorder)    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   Bipolar 1 disorder (HCC)    Chronic back pain    Chronic pain    chronic l leg pain   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 01/27/2012   COPD (chronic obstructive pulmonary disease) (HCC)    DDD (degenerative disc disease), lumbar    Degenerative disc disease, lumbar    Depression    Diabetes mellitus without complication (HCC)    Discitis    Epidural abscess 10/08/2021   GERD (gastroesophageal reflux disease)    Hepatitis C    Hepatitis C    History of kidney stones    History of stomach ulcers    Hx of diabetes mellitus    due to infection   Hypertension    Lung nodules    3 on L and 2 on R   Osteomyelitis of lumbar spine (HCC) 10/13/2022   Polysubstance abuse (HCC) 09/03/2022   PTSD (post-traumatic stress disorder)    Sciatica    Substance abuse (HCC)     Past Surgical History:  Procedure Laterality Date   FRACTURE SURGERY     HARDWARE REMOVAL  01/27/2012   Procedure: HARDWARE REMOVAL;  Surgeon: Lonni CINDERELLA Poli, MD;  Location: [facility];  Service: Orthopedics;  Laterality: Left;   IR LUMBAR DISC ASPIRATION W/IMG GUIDE  06/08/2017   IR LUMBAR DISC ASPIRATION W/IMG GUIDE  07/06/2021   LAPAROSCOPIC LYSIS OF ADHESIONS  05/24/2022   Procedure: LAPAROSCOPIC LYSIS OF ADHESIONS;  Surgeon: Mavis Anes, MD;  Location: [facility];  Service: General;;   RADIOLOGY WITH ANESTHESIA N/A 06/08/2017   Procedure: DISC ASPIRATION;  Surgeon: Dolphus Carrion, MD;  Location: [facility];  Service: Radiology;  Laterality: N/A;   TIBIA IM NAIL INSERTION  01/27/2012   Procedure: INTRAMEDULLARY (IM) NAIL TIBIAL;  Surgeon: Lonni CINDERELLA Poli, MD;  Location: [facility];  Service: Orthopedics;  Laterality: Left;  Removal of  IM Rod and Screws Left Tibia with Exchange Nail, Allograft Bone Graft Left Tibia   XI ROBOTIC ASSISTED INGUINAL HERNIA REPAIR WITH MESH Right 05/24/2022   Procedure: XI ROBOTIC ASSISTED INGUINAL HERNIA REPAIR WITH MESH;  Surgeon: Mavis Anes, MD;  Location: [facility];  Service: General;  Laterality: Right;   Family History:  Family History  Problem Relation Age of Onset   Diabetes Mother     Alcohol abuse Father    Cirrhosis Father    Aneurysm Father    Family Psychiatric  History: See H&P Social History:  Social History   Substance and Sexual Activity  Alcohol Use Not Currently   Alcohol/week: 1.0 standard drink of alcohol   Types: 1 Standard drinks or equivalent per week   Comment: last used July 2023     Social History   Substance and Sexual Activity  Drug Use Not Currently   Types: IV, Fentanyl , Benzodiazepines   Comment: last used July 2023    Social History   Socioeconomic History   Marital status: Single    Spouse name: Not on file   Number of children: Not on file   Years of education: 12   Highest education level: High school graduate  Occupational History   Not on file  Tobacco Use   Smoking status: Every Day    Current packs/day: 1.50    Average packs/day: 1.5 packs/day for 33.0 years (49.5 ttl pk-yrs)    Types: Cigarettes   Smokeless tobacco: Former    Quit date: 08/08/2006   Tobacco comments:    1 PPD  Vaping Use   Vaping status: Every Day  Substance and Sexual Activity   Alcohol use: Not Currently    Alcohol/week: 1.0 standard drink of alcohol    Types: 1 Standard drinks or equivalent per week    Comment: last used July 2023   Drug use: Not Currently    Types: IV, Fentanyl , Benzodiazepines    Comment: last used July 2023   Sexual activity: Not Currently  Other Topics Concern   Not on file  Social History Narrative   Not on file   Social Drivers of Health   Financial Resource Strain: High Risk (09/23/2017)   Overall Financial Resource Strain (CARDIA)    Difficulty of Paying Living Expenses: Very hard  Food Insecurity: Food Insecurity Present (10/15/2023)   Hunger Vital Sign    Worried About Running Out of Food in the Last Year: Sometimes true    Ran Out of Food in the Last Year: Sometimes true  Transportation Needs: Unmet Transportation Needs (10/15/2023)   PRAPARE - Transportation    Lack of Transportation (Medical): Yes     Lack of Transportation (Non-Medical): Yes  Physical Activity: Unknown (09/23/2017)   Exercise Vital Sign    Days of Exercise per Week: Patient declined    Minutes of Exercise per Session: Patient declined  Stress: Stress Concern Present (09/23/2017)   [employer] of Occupational Health - Occupational Stress Questionnaire    Feeling of Stress : Very much  Social Connections: Socially Isolated (09/23/2017)   Social Connection and Isolation Panel    Frequency of Communication with Friends and Family: Once a week    Frequency of Social Gatherings with Friends and Family: Once a week    Attends Religious Services: Never    Database administrator or Organizations: No    Attends Banker Meetings: Never    Marital Status: Never married    Hospital Course:  During  the patient's hospitalization, patient had extensive initial psychiatric evaluation, and follow-up psychiatric evaluations every day.  Psychiatric diagnoses provided upon initial assessment: Opioid use disorder, severe, dependence (HCC)  Upon admission, the patient's psychiatric medications were adjusted as follows: Suboxone  8-2 mg twice daily was resumed for opioid use disorder, Seroquel  100 mg nightly was restarted for insomnia and mood stabilization, and Zoloft  50 mg daily was restarted for depressive symptoms.  No additional medication adjustments were made during the hospitalization.  Patient's care was discussed during the interdisciplinary team meeting every day during the hospitalization.  The patient denies having side effects to prescribed psychiatric medication.  Gradually, patient started adjusting to milieu. The patient was evaluated each day by a clinical provider to ascertain response to treatment. Improvement was noted by the patient's report of decreasing symptoms, improved sleep and appetite, affect, medication tolerance, behavior, and participation in unit programming.  Patient was asked each day to  complete a self inventory noting mood, mental status, pain, new symptoms, anxiety and concerns.    Symptoms were reported as significantly decreased or resolved completely by discharge.   On day of discharge, the patient reports that their mood is stable. The patient denied having suicidal thoughts for more than 48 hours prior to discharge.  Patient denies having homicidal thoughts.  Patient denies having auditory hallucinations.  Patient denies any visual hallucinations or other symptoms of psychosis. The patient was motivated to continue taking medication with a goal of continued improvement in mental health.   The patient reports their target psychiatric symptoms of worsening anxiety, depression, and insomnia responded well to the psychiatric medications, and the patient reports overall benefit other psychiatric hospitalization. Supportive psychotherapy was provided to the patient. The patient also participated in regular group therapy while hospitalized. Coping skills, problem solving as well as relaxation therapies were also part of the unit programming.  Labs were reviewed with the patient, and abnormal results were discussed with the patient.  The patient is able to verbalize their individual safety plan to this provider.  # It is recommended to the patient to continue psychiatric medications as prescribed, after discharge from the hospital.    # It is recommended to the patient to follow up with your outpatient psychiatric provider and PCP.  # It was discussed with the patient, the impact of alcohol, drugs, tobacco have been there overall psychiatric and medical wellbeing, and total abstinence from substance use was recommended the patient.ed.  # Prescriptions provided or sent directly to preferred pharmacy at discharge. Patient agreeable to plan. Given opportunity to ask questions. Appears to feel comfortable with discharge.    # In the event of worsening symptoms, the patient is  instructed to call the crisis hotline, 911 and or go to the nearest ED for appropriate evaluation and treatment of symptoms. To follow-up with primary care provider for other medical issues, concerns and or health care needs  # Patient was discharged home with a plan to follow up as noted below.   Physical Findings: AIMS:  , , 0 ,  ,  ,  ,   CIWA:  CIWA-Ar Total: 3 COWS:  COWS Total Score: 0  Musculoskeletal: Strength & Muscle Tone: within normal limits Gait & Station: normal Patient leans: N/A   Psychiatric Specialty Exam:  Presentation  General Appearance:  Casual  Eye Contact: Good  Speech: Clear and Coherent; Normal Rate  Speech Volume: Normal  Handedness: Right   Mood and Affect  Mood: Euthymic  Affect: Appropriate; Full Range  Thought Process  Thought Processes: Coherent; Linear  Descriptions of Associations:Intact  Orientation:Full (Time, Place and Person)  Thought Content:WDL  History of Schizophrenia/Schizoaffective disorder:No data recorded Duration of Psychotic Symptoms:No data recorded Hallucinations:Hallucinations: None  Ideas of Reference:None  Suicidal Thoughts:Suicidal Thoughts: No SI Passive Intent and/or Plan: -- (Denies)  Homicidal Thoughts:Homicidal Thoughts: No   Sensorium  Memory: Immediate Good; Recent Good; Remote Good  Judgment: Intact  Insight: Present   Executive Functions  Concentration: Good  Attention Span: Good  Recall: Good  Fund of Knowledge: Good  Language: Good   Psychomotor Activity  Psychomotor Activity: Psychomotor Activity: Normal   Assets  Assets: Communication Skills; Desire for Improvement; Resilience; Transportation   Sleep  Sleep: Sleep: Good  Estimated Sleeping Duration (Last 24 Hours): 5.50-6.00 hours   Physical Exam: Physical Exam Vitals and nursing note reviewed.  Constitutional:      General: He is not in acute distress.    Appearance: He is not  ill-appearing.  HENT:     Mouth/Throat:     Pharynx: Oropharynx is clear.     Comments: Dental pain Cardiovascular:     Pulses: Normal pulses.  Pulmonary:     Effort: No respiratory distress.  Skin:    General: Skin is dry.  Neurological:     Mental Status: He is alert and oriented to person, place, and time. Mental status is at baseline.  Psychiatric:        Mood and Affect: Mood normal.        Behavior: Behavior normal.        Thought Content: Thought content normal.        Judgment: Judgment normal.    Review of Systems  Constitutional: Negative.   Psychiatric/Behavioral:  Negative for hallucinations, memory loss and suicidal ideas.    Blood pressure (!) 121/91, pulse 88, temperature 97.8 F (36.6 C), temperature source Oral, resp. rate 16, height 5' 3 (1.6 m), weight 62.5 kg, SpO2 100%. Body mass index is 24.41 kg/m.   Social History   Tobacco Use  Smoking Status Every Day   Current packs/day: 1.50   Average packs/day: 1.5 packs/day for 33.0 years (49.5 ttl pk-yrs)   Types: Cigarettes  Smokeless Tobacco Former   Quit date: 08/08/2006  Tobacco Comments   1 PPD   Tobacco Cessation:  A prescription for an FDA-approved tobacco cessation medication provided at discharge   Blood Alcohol level:  Lab Results  Component Value Date   [address] [employer] <15 10/15/2023   ETH <10 09/03/2022    Metabolic Disorder Labs:  Lab Results  Component Value Date   HGBA1C 6.7 (H) 10/17/2023   MPG 145.59 10/17/2023   MPG 162.81 09/04/2022   Lab Results  Component Value Date   PROLACTIN 31.3 (H) 05/23/2015   Lab Results  Component Value Date   CHOL 138 10/17/2023   TRIG 66 10/17/2023   HDL 30 (L) 10/17/2023   CHOLHDL 4.6 10/17/2023   VLDL 13 10/17/2023   LDLCALC 95 10/17/2023   LDLCALC 83 12/09/2020    See Psychiatric Specialty Exam and Suicide Risk Assessment completed by Attending Physician prior to discharge.  Discharge destination:  Home  Is patient on multiple  antipsychotic therapies at discharge:  No   Has Patient had three or more failed trials of antipsychotic monotherapy by history:  No  Recommended Plan for Multiple Antipsychotic Therapies: NA  Discharge Instructions     Activity as tolerated - No restrictions   Complete by: As directed    Diet -  low sodium heart healthy   Complete by: As directed       Allergies as of 10/25/2023       Reactions   Tylenol  [acetaminophen ] Other (See Comments)   Due to Ulcers/ Liver Damage   Morphine  Itching        Medication List     STOP taking these medications    Adderall 10 MG tablet Generic drug: amphetamine-dextroamphetamine   cloNIDine  0.1 MG tablet Commonly known as: CATAPRES    cyclobenzaprine  10 MG tablet Commonly known as: FLEXERIL    DULoxetine  30 MG capsule Commonly known as: CYMBALTA    hydrOXYzine  50 MG capsule Commonly known as: VISTARIL  Replaced by: hydrOXYzine  50 MG tablet   Suboxone  8-2 MG Film Generic drug: Buprenorphine  HCl-Naloxone  HCl Replaced by: buprenorphine -naloxone  8-2 mg Subl SL tablet       TAKE these medications      Indication  amoxicillin -clavulanate 875-125 MG tablet Commonly known as: AUGMENTIN  Take 1 tablet by mouth every 12 (twelve) hours for 1 dose.  Indication: Dental infection   benzocaine 10 % mucosal gel Commonly known as: ORAJEL Use as directed in the mouth or throat 4 (four) times daily as needed for mouth pain.  Indication: Dental pain   buprenorphine -naloxone  8-2 mg Subl SL tablet Commonly known as: SUBOXONE  Place 1 tablet under the tongue every 12 (twelve) hours. Replaces: Suboxone  8-2 MG Film  Indication: Opioid Dependence   chlorhexidine  0.12 % solution Commonly known as: PERIDEX  Use as directed 15 mLs in the mouth or throat 2 (two) times daily.  Indication: Tooth Plaque   hydrOXYzine  50 MG tablet Commonly known as: ATARAX  Take 1 tablet (50 mg total) by mouth 2 (two) times daily as needed for anxiety. Replaces:  hydrOXYzine  50 MG capsule  Indication: Feeling Anxious   metFORMIN  500 MG tablet Commonly known as: GLUCOPHAGE  Take 1 tablet (500 mg total) by mouth 2 (two) times daily with a meal for 14 days.  Indication: Type 2 Diabetes   naloxone  4 MG/0.1ML Liqd nasal spray kit Commonly known as: NARCAN  Take in case of overdose  Indication: Opioid Overdose   nicotine  21 mg/24hr patch Commonly known as: NICODERM CQ  - dosed in mg/24 hours Place 1 patch (21 mg total) onto the skin daily.  Indication: Nicotine  Addiction   polyethylene glycol 17 g packet Commonly known as: MIRALAX  / GLYCOLAX  Take 17 g by mouth daily.  Indication: Constipation   QUEtiapine  100 MG tablet Commonly known as: SEROQUEL  Take 1 tablet (100 mg total) by mouth at bedtime. What changed: Another medication with the same name was removed. Continue taking this medication, and follow the directions you see here.  Indication: Trouble Sleeping, Mood control   senna-docusate 8.6-50 MG tablet Commonly known as: Senokot-S Take 1 tablet by mouth 2 (two) times daily.  Indication: Constipation   sertraline  50 MG tablet Commonly known as: ZOLOFT  Take 1 tablet (50 mg total) by mouth daily.  Indication: Major Depressive Disorder        Follow-up Information     [facility]. Go on 10/26/2023.   Specialty: Behavioral Health Why: You have an assessment for substance use therapy services on 10/26/23 at 4:00 pm.  At this time, you can schedule an appointment for medication management services also, if you wish. Contact information: [address] [place] [address] [phone]         [facility] [employer]. Go on 10/27/2023.   Why: Please go to this provider as a walk in patient (provider  request), on 10/27/23 at 8:00 am to schedule an appointment for medication management services.  You may also go Monday through Friday, from 8 am to 4 pm. Contact information: [address] [place], [place]  [address] Phone: [phone]        [facility] Follow up.   Specialty: Addiction Medicine Why: Referral made Contact information: [address] [place] [address] [phone]                 Follow-up recommendations:  Activity: as tolerated  Diet: heart healthy  Other: -Follow-up with your outpatient psychiatric provider -instructions on appointment date, time, and address (location) are provided to you in discharge paperwork.  -Take your psychiatric medications as prescribed at discharge - instructions are provided to you in the discharge paperwork  -Follow-up with outpatient primary care doctor and other specialists -for management of preventative medicine and chronic medical disease: COPD, Diabetes, Hepatitis C, Hypertension  -Testing: Follow-up with outpatient provider for abnormal lab results: Low HDL (30), Elevated hemoglobin A1c 6.7  -If you are prescribed an atypical antipsychotic medication, we recommend that your outpatient psychiatrist follow routine screening for side effects within 3 months of discharge, including monitoring: AIMS scale, height, weight, blood pressure, fasting lipid panel, HbA1c, and fasting blood sugar.   -Recommend total abstinence from alcohol, tobacco, and other illicit drug use at discharge.   -If your psychiatric symptoms recur, worsen, or if you have side effects to your psychiatric medications, call your outpatient psychiatric provider, 911, [phone] or go to the nearest emergency department.  -If suicidal thoughts occur, immediately call your outpatient psychiatric provider, 911, [phone] or go to the nearest emergency department.    Signed: Blair Chiquita Hint, NP 10/25/2023, 9:39 AM

## 2023-10-25 NOTE — Group Note (Signed)
 Date:  10/25/2023 Time:  9:34 AM  Group Topic/Focus:  Goals Group:   The focus of this group is to help patients establish daily goals to achieve during treatment and discuss how the patient can incorporate goal setting into their daily lives to aide in recovery. Patients worked independently on worksheets regarding SMART goals, and the group discussed Maslow's hierarchy and its importance for goal setting.   Participation Level:  Did Not Attend  Participation Quality:  N/A  Affect:  N/A  Cognitive:  N/A  Insight: None  Engagement in Group:  None  Modes of Intervention:  N/A  Additional Comments:  Patient did not attend goals group.  Kristi HERO Abrial Arrighi 10/25/2023, 9:34 AM

## 2023-11-26 ENCOUNTER — Other Ambulatory Visit: Payer: Self-pay

## 2023-11-26 ENCOUNTER — Emergency Department (HOSPITAL_COMMUNITY)
Admission: EM | Admit: 2023-11-26 | Discharge: 2023-11-26 | Disposition: A | Discharge status: Home / Self Care | Payer: MEDICAID | Attending: Emergency Medicine | Admitting: Emergency Medicine

## 2023-11-26 ENCOUNTER — Inpatient Hospital Stay (HOSPITAL_COMMUNITY)
Admission: AD | Admit: 2023-11-26 | Discharge: 2023-12-01 | DRG: 897 | Disposition: A | Discharge status: Home / Self Care | Payer: MEDICAID | Source: Intra-hospital

## 2023-11-26 ENCOUNTER — Encounter (HOSPITAL_COMMUNITY): Payer: Self-pay | Admitting: Emergency Medicine

## 2023-11-26 DIAGNOSIS — F1124 Opioid dependence with opioid-induced mood disorder: Principal | ICD-10-CM | POA: Diagnosis present

## 2023-11-26 DIAGNOSIS — Z8711 Personal history of peptic ulcer disease: Secondary | ICD-10-CM | POA: Diagnosis not present

## 2023-11-26 DIAGNOSIS — Z87442 Personal history of urinary calculi: Secondary | ICD-10-CM | POA: Diagnosis not present

## 2023-11-26 DIAGNOSIS — F4310 Post-traumatic stress disorder, unspecified: Secondary | ICD-10-CM | POA: Diagnosis present

## 2023-11-26 DIAGNOSIS — Z5900 Homelessness unspecified: Secondary | ICD-10-CM

## 2023-11-26 DIAGNOSIS — J449 Chronic obstructive pulmonary disease, unspecified: Secondary | ICD-10-CM | POA: Diagnosis present

## 2023-11-26 DIAGNOSIS — F332 Major depressive disorder, recurrent severe without psychotic features: Secondary | ICD-10-CM | POA: Diagnosis not present

## 2023-11-26 DIAGNOSIS — K219 Gastro-esophageal reflux disease without esophagitis: Secondary | ICD-10-CM | POA: Diagnosis present

## 2023-11-26 DIAGNOSIS — I10 Essential (primary) hypertension: Secondary | ICD-10-CM | POA: Insufficient documentation

## 2023-11-26 DIAGNOSIS — Z7984 Long term (current) use of oral hypoglycemic drugs: Secondary | ICD-10-CM

## 2023-11-26 DIAGNOSIS — F319 Bipolar disorder, unspecified: Secondary | ICD-10-CM | POA: Diagnosis present

## 2023-11-26 DIAGNOSIS — F419 Anxiety disorder, unspecified: Secondary | ICD-10-CM | POA: Diagnosis present

## 2023-11-26 DIAGNOSIS — E119 Type 2 diabetes mellitus without complications: Secondary | ICD-10-CM | POA: Diagnosis present

## 2023-11-26 DIAGNOSIS — F1123 Opioid dependence with withdrawal: Secondary | ICD-10-CM | POA: Diagnosis present

## 2023-11-26 DIAGNOSIS — F17210 Nicotine dependence, cigarettes, uncomplicated: Secondary | ICD-10-CM | POA: Diagnosis present

## 2023-11-26 DIAGNOSIS — Z604 Social exclusion and rejection: Secondary | ICD-10-CM | POA: Diagnosis present

## 2023-11-26 DIAGNOSIS — Z79899 Other long term (current) drug therapy: Secondary | ICD-10-CM | POA: Diagnosis not present

## 2023-11-26 DIAGNOSIS — R45851 Suicidal ideations: Secondary | ICD-10-CM | POA: Diagnosis present

## 2023-11-26 DIAGNOSIS — Z5902 Unsheltered homelessness: Secondary | ICD-10-CM | POA: Diagnosis not present

## 2023-11-26 DIAGNOSIS — M25551 Pain in right hip: Secondary | ICD-10-CM | POA: Diagnosis present

## 2023-11-26 DIAGNOSIS — Z886 Allergy status to analgesic agent status: Secondary | ICD-10-CM

## 2023-11-26 DIAGNOSIS — Z885 Allergy status to narcotic agent status: Secondary | ICD-10-CM

## 2023-11-26 DIAGNOSIS — Z833 Family history of diabetes mellitus: Secondary | ICD-10-CM

## 2023-11-26 DIAGNOSIS — Z811 Family history of alcohol abuse and dependence: Secondary | ICD-10-CM | POA: Diagnosis not present

## 2023-11-26 DIAGNOSIS — Z794 Long term (current) use of insulin: Secondary | ICD-10-CM | POA: Diagnosis not present

## 2023-11-26 DIAGNOSIS — F1120 Opioid dependence, uncomplicated: Secondary | ICD-10-CM | POA: Diagnosis present

## 2023-11-26 LAB — ETHANOL: Alcohol, Ethyl (B): <15 mg/dL (ref <15)

## 2023-11-26 LAB — COMPREHENSIVE METABOLIC PANEL WITH GFR
ALT: 61 U/L — ABNORMAL HIGH (ref 0–44)
AST: 41 U/L (ref 15–41)
Albumin: 3.8 g/dL (ref 3.5–5.0)
Alkaline Phosphatase: 64 U/L (ref 38–126)
Anion gap: 11 (ref 5–15)
BUN: 9 mg/dL (ref 6–20)
CO2: 25 mmol/L (ref 22–32)
Calcium: 9.8 mg/dL (ref 8.9–10.3)
Chloride: 101 mmol/L (ref 98–111)
Creatinine, Ser: 0.88 mg/dL (ref 0.61–1.24)
GFR, Estimated: >60 mL/min (ref >60)
Glucose, Bld: 273 mg/dL — ABNORMAL HIGH (ref 70–99)
Potassium: 4.2 mmol/L (ref 3.5–5.1)
Sodium: 136 mmol/L (ref 135–145)
Total Bilirubin: 0.2 mg/dL (ref 0.0–1.2)
Total Protein: 7.2 g/dL (ref 6.5–8.1)

## 2023-11-26 LAB — CBC
HCT: 40.3 % (ref 39.0–52.0)
Hemoglobin: 13 g/dL (ref 13.0–17.0)
MCH: 30.9 pg (ref 26.0–34.0)
MCHC: 32.3 g/dL (ref 30.0–36.0)
MCV: 95.7 fL (ref 80.0–100.0)
Platelets: 193 K/uL (ref 150–400)
RBC: 4.21 MIL/uL — ABNORMAL LOW (ref 4.22–5.81)
RDW: 14.1 % (ref 11.5–15.5)
WBC: 4 K/uL (ref 4.0–10.5)
nRBC: 0 % (ref 0.0–0.2)

## 2023-11-26 LAB — URINE DRUG SCREEN
Amphetamines: NEGATIVE
Barbiturates: NEGATIVE
Benzodiazepines: NEGATIVE
Cocaine: NEGATIVE
Fentanyl: POSITIVE — AB
Methadone Scn, Ur: NEGATIVE
Opiates: NEGATIVE
Tetrahydrocannabinol: NEGATIVE

## 2023-11-26 MED ORDER — METFORMIN HCL 500 MG PO TABS
500.0000 mg | ORAL_TABLET | Freq: Two times a day (BID) | ORAL | Status: DC
Start: 2023-11-27 — End: 2023-12-01
  Administered 2023-11-27 – 2023-12-01 (×8): 500 mg via ORAL
  Filled 2023-11-26 (×8): qty 1

## 2023-11-26 MED ORDER — ALUM & MAG HYDROXIDE-SIMETH 200-200-20 MG/5ML PO SUSP: 30.0000 mL | ORAL | Status: DC | PRN

## 2023-11-26 MED ORDER — LOPERAMIDE HCL 2 MG PO CAPS: 2.0000 mg | ORAL_CAPSULE | ORAL | Status: DC | PRN

## 2023-11-26 MED ORDER — DICYCLOMINE HCL 20 MG PO TABS: 20.0000 mg | ORAL_TABLET | Freq: Four times a day (QID) | ORAL | Status: DC | PRN

## 2023-11-26 MED ORDER — HALOPERIDOL LACTATE 5 MG/ML IJ SOLN: 5.0000 mg | Freq: Three times a day (TID) | INTRAMUSCULAR | Status: DC | PRN

## 2023-11-26 MED ORDER — SERTRALINE HCL 50 MG PO TABS
50.0000 mg | ORAL_TABLET | Freq: Every day | ORAL | Status: DC
  Administered 2023-11-28: 50 mg via ORAL
  Filled 2023-11-26: qty 1

## 2023-11-26 MED ORDER — CLONIDINE HCL 0.1 MG PO TABS
0.1000 mg | ORAL_TABLET | Freq: Four times a day (QID) | ORAL | Status: AC
  Administered 2023-11-27 – 2023-11-28 (×6): 0.1 mg via ORAL
  Filled 2023-11-26 (×6): qty 1

## 2023-11-26 MED ORDER — METHOCARBAMOL 500 MG PO TABS: 500.0000 mg | ORAL_TABLET | Freq: Three times a day (TID) | ORAL | Status: DC | PRN

## 2023-11-26 MED ORDER — LORAZEPAM 2 MG/ML IJ SOLN: 2.0000 mg | Freq: Three times a day (TID) | INTRAMUSCULAR | Status: DC | PRN

## 2023-11-26 MED ORDER — CLONIDINE HCL 0.1 MG PO TABS: 0.1000 mg | ORAL_TABLET | Freq: Four times a day (QID) | ORAL | Status: DC

## 2023-11-26 MED ORDER — CHLORHEXIDINE GLUCONATE 0.12 % MT SOLN
15.0000 mL | Freq: Two times a day (BID) | OROMUCOSAL | Status: DC
Start: 2023-11-27 — End: 2023-12-01
  Administered 2023-11-28 – 2023-11-29 (×3): 15 mL via OROMUCOSAL
  Filled 2023-11-26 (×11): qty 15

## 2023-11-26 MED ORDER — CLONIDINE HCL 0.1 MG PO TABS
0.1000 mg | ORAL_TABLET | Freq: Every day | ORAL | Status: DC
  Filled 2023-11-26: qty 1

## 2023-11-26 MED ORDER — CLONIDINE HCL 0.1 MG PO TABS
0.1000 mg | ORAL_TABLET | ORAL | Status: AC
  Administered 2023-11-29 – 2023-11-30 (×4): 0.1 mg via ORAL
  Filled 2023-11-26 (×4): qty 1

## 2023-11-26 MED ORDER — CLONIDINE HCL 0.1 MG PO TABS: 0.1000 mg | ORAL_TABLET | ORAL | Status: DC

## 2023-11-26 MED ORDER — MAGNESIUM HYDROXIDE 400 MG/5ML PO SUSP: 30.0000 mL | Freq: Every day | ORAL | Status: DC | PRN

## 2023-11-26 MED ORDER — BENZOCAINE 10 % MT GEL: Freq: Four times a day (QID) | OROMUCOSAL | Status: DC | PRN

## 2023-11-26 MED ORDER — HYDROXYZINE HCL 25 MG PO TABS: 25.0000 mg | ORAL_TABLET | Freq: Four times a day (QID) | ORAL | Status: DC | PRN

## 2023-11-26 MED ORDER — SENNOSIDES-DOCUSATE SODIUM 8.6-50 MG PO TABS: 1.0000 | ORAL_TABLET | Freq: Two times a day (BID) | ORAL | Status: DC

## 2023-11-26 MED ORDER — HYDROXYZINE HCL 25 MG PO TABS: 50.0000 mg | ORAL_TABLET | Freq: Two times a day (BID) | ORAL | Status: DC | PRN

## 2023-11-26 MED ORDER — HALOPERIDOL LACTATE 5 MG/ML IJ SOLN: 10.0000 mg | Freq: Three times a day (TID) | INTRAMUSCULAR | Status: DC | PRN

## 2023-11-26 MED ORDER — POLYETHYLENE GLYCOL 3350 17 G PO PACK
17.0000 g | PACK | Freq: Every day | ORAL | Status: DC
Start: 2023-11-26 — End: 2023-11-26
  Administered 2023-11-26: 17 g via ORAL
  Filled 2023-11-26: qty 1

## 2023-11-26 MED ORDER — HALOPERIDOL 5 MG PO TABS: 5.0000 mg | ORAL_TABLET | Freq: Three times a day (TID) | ORAL | Status: DC | PRN

## 2023-11-26 MED ORDER — ONDANSETRON 4 MG PO TBDP: 4.0000 mg | ORAL_TABLET | Freq: Four times a day (QID) | ORAL | Status: DC | PRN

## 2023-11-26 MED ORDER — QUETIAPINE FUMARATE 100 MG PO TABS
100.0000 mg | ORAL_TABLET | Freq: Every day | ORAL | Status: DC
Start: 2023-11-26 — End: 2023-11-26

## 2023-11-26 MED ORDER — DIPHENHYDRAMINE HCL 25 MG PO CAPS: 50.0000 mg | ORAL_CAPSULE | Freq: Three times a day (TID) | ORAL | Status: DC | PRN

## 2023-11-26 MED ORDER — NAPROXEN 500 MG PO TABS: 500.0000 mg | ORAL_TABLET | Freq: Two times a day (BID) | ORAL | Status: DC | PRN

## 2023-11-26 MED ORDER — QUETIAPINE FUMARATE 100 MG PO TABS
100.0000 mg | ORAL_TABLET | Freq: Every day | ORAL | Status: DC
  Administered 2023-11-27 – 2023-11-30 (×4): 100 mg via ORAL
  Filled 2023-11-26 (×5): qty 1

## 2023-11-26 MED ORDER — NICOTINE 21 MG/24HR TD PT24
21.0000 mg | MEDICATED_PATCH | Freq: Every day | TRANSDERMAL | Status: DC
  Administered 2023-11-28 – 2023-11-30 (×3): 21 mg via TRANSDERMAL
  Filled 2023-11-26 (×4): qty 1

## 2023-11-26 MED ORDER — NICOTINE 21 MG/24HR TD PT24: 21.0000 mg | MEDICATED_PATCH | Freq: Every day | TRANSDERMAL | Status: DC

## 2023-11-26 MED ORDER — DIPHENHYDRAMINE HCL 50 MG/ML IJ SOLN: 50.0000 mg | Freq: Three times a day (TID) | INTRAMUSCULAR | Status: DC | PRN

## 2023-11-26 MED ORDER — BUPRENORPHINE HCL-NALOXONE HCL 8-2 MG SL SUBL: 1.0000 | SUBLINGUAL_TABLET | Freq: Two times a day (BID) | SUBLINGUAL | Status: DC

## 2023-11-26 MED ORDER — SENNOSIDES-DOCUSATE SODIUM 8.6-50 MG PO TABS
1.0000 | ORAL_TABLET | Freq: Two times a day (BID) | ORAL | Status: DC
  Administered 2023-11-27 – 2023-12-01 (×8): 1 via ORAL
  Filled 2023-11-26 (×9): qty 1

## 2023-11-26 MED ORDER — POLYETHYLENE GLYCOL 3350 17 G PO PACK
17.0000 g | PACK | Freq: Every day | ORAL | Status: DC
  Administered 2023-11-29: 17 g via ORAL
  Filled 2023-11-26 (×3): qty 1

## 2023-11-26 MED ORDER — SERTRALINE HCL 50 MG PO TABS
50.0000 mg | ORAL_TABLET | Freq: Every day | ORAL | Status: DC
Start: 2023-11-26 — End: 2023-11-26

## 2023-11-26 MED ORDER — BUPRENORPHINE HCL-NALOXONE HCL 8-2 MG SL SUBL
1.0000 | SUBLINGUAL_TABLET | Freq: Two times a day (BID) | SUBLINGUAL | Status: DC
  Administered 2023-11-27 – 2023-12-01 (×9): 1 via SUBLINGUAL
  Filled 2023-11-26 (×8): qty 1

## 2023-11-26 MED ORDER — CLONIDINE HCL 0.1 MG PO TABS: 0.1000 mg | ORAL_TABLET | Freq: Every day | ORAL | Status: DC

## 2023-11-26 MED ORDER — HYDROXYZINE HCL 25 MG PO TABS
25.0000 mg | ORAL_TABLET | Freq: Four times a day (QID) | ORAL | Status: DC | PRN
  Administered 2023-11-27: 25 mg via ORAL
  Filled 2023-11-26 (×2): qty 1

## 2023-11-26 NOTE — ED Notes (Signed)
 Safe transport called to have patient taken to [facility]

## 2023-11-26 NOTE — Group Note (Signed)
 Date:  11/26/2023 Time:  9:08 PM  Group Topic/Focus:  Wrap-Up Group:   The focus of this group is to help patients review their daily goal of treatment and discuss progress on daily workbooks.    Participation Level:  Did Not Attend  Participation Quality:  n/a  Affect:  n/a  Cognitive:  n/a  Insight: None  Engagement in Group:  n/a  Modes of Intervention:  n/a  Additional Comments:  Patient was encouraged but did not attend  Joshua Thomas 11/26/2023, 9:08 PM

## 2023-11-26 NOTE — Progress Notes (Signed)
 Pt arrived just prior to shift change.   skin assessment completed and  pt was noted to be exteremly dirty and unkempt pt has mult scratches, scabs areas bilat on arms, back and legs unable to fully assess due to dirt. pt reports I have been homeless and itching myself with a stick. pt also has blisters on top of feet bilat. Pt was brought onto the unit and given meal and shower supplies.  Q 15 min checks and Vitals completed for safety.

## 2023-11-26 NOTE — Consult Note (Signed)
 Sent TEAMs message to hospital team, Avelina Kitty, NT; Dr. Donnice Hughes and [facility], NT requesting to see patient for psych assessment via tts cart.  Unable to add RN as no one was assigned in Teams.   Interim [email or web address] : Alexia CHRISTELLA Mt, RN and Norlene Shed, RN were both added to the chat by Nurse Tech but this writer still unable to see patient for assessment.   Psych will continue to make attempts to see patient.

## 2023-11-26 NOTE — ED Notes (Signed)
 Pt has been wanded by security.

## 2023-11-26 NOTE — BH Assessment (Signed)
(  Sleep Hours) - (Any PRNs that were needed, meds refused, or side effects to meds)- Pt refused all evening meds. (Any disturbances and when (visitation, over night)- (Concerns raised by the patient)- Pt refused EKG (SI/HI/AVH)- Denies

## 2023-11-26 NOTE — ED Provider Notes (Signed)
 Joshua Thomas EMERGENCY DEPARTMENT AT [facility] Provider Note   CSN: [id number] Arrival date & time: 11/26/23  1331     Patient presents with: Suicidal   Joshua Thomas is a 48 y.o. male with history of substance abuse presents with complaints of SI.  Has plans for fentanyl  overdose.  Has been under increased stressors recently and is more depressed with life circumstances.  Does complain of some ongoing right hip pain.  No numbness or tingling.  No fevers or chills.  No injury or trauma.  Pain is worse with ambulating.   HPI      Prior to Admission medications   Medication Sig Start Date End Date Taking? Authorizing Provider  benzocaine (ORAJEL) 10 % mucosal gel Use as directed in the mouth or throat 4 (four) times daily as needed for mouth pain. 10/25/23   Bennett, Christal H, NP  buprenorphine -naloxone  (SUBOXONE ) 8-2 mg SUBL SL tablet Place 1 tablet under the tongue every 12 (twelve) hours. 10/25/23   Bennett, Christal H, NP  chlorhexidine  (PERIDEX ) 0.12 % solution Use as directed 15 mLs in the mouth or throat 2 (two) times daily. 10/25/23   Bennett, Christal H, NP  hydrOXYzine  (ATARAX ) 50 MG tablet Take 1 tablet (50 mg total) by mouth 2 (two) times daily as needed for anxiety. 10/25/23   Bennett, Christal H, NP  metFORMIN  (GLUCOPHAGE ) 500 MG tablet Take 1 tablet (500 mg total) by mouth 2 (two) times daily with a meal for 14 days. 10/25/23 11/08/23  Joshua Thomas H, NP  naloxone  (NARCAN ) nasal spray 4 mg/0.1 mL Take in case of overdose 08/07/20   Long, Joshua G, MD  nicotine  (NICODERM CQ  - DOSED IN MG/24 HOURS) 21 mg/24hr patch Place 1 patch (21 mg total) onto the skin daily. 10/25/23   Bennett, Christal H, NP  polyethylene glycol (MIRALAX  / GLYCOLAX ) 17 Thomas packet Take 17 Thomas by mouth daily. 10/25/23   Bennett, Christal H, NP  QUEtiapine  (SEROQUEL ) 100 MG tablet Take 1 tablet (100 mg total) by mouth at bedtime. 10/25/23   Bennett, Christal H, NP  senna-docusate (SENOKOT-S)  8.6-50 MG tablet Take 1 tablet by mouth 2 (two) times daily. 10/25/23   Bennett, Christal H, NP  sertraline  (ZOLOFT ) 50 MG tablet Take 1 tablet (50 mg total) by mouth daily. 10/25/23   Joshua Thomas DEL, NP    Allergies: Tylenol  [acetaminophen ] and Morphine     Review of Systems  Psychiatric/Behavioral:  Positive for suicidal ideas.     Updated Vital Signs BP (!) 164/97 (BP Location: Left Arm)   Pulse 86   Temp 98 F (36.7 C) (Oral)   Resp 16   SpO2 100%   Physical Exam Vitals and nursing note reviewed.  Constitutional:      General: He is not in acute distress.    Appearance: He is well-developed.  HENT:     Head: Normocephalic and atraumatic.  Eyes:     Conjunctiva/sclera: Conjunctivae normal.  Cardiovascular:     Rate and Rhythm: Normal rate and regular rhythm.     Heart sounds: No murmur heard. Pulmonary:     Effort: Pulmonary effort is normal. No respiratory distress.     Breath sounds: Normal breath sounds.  Abdominal:     Palpations: Abdomen is soft.     Tenderness: There is no abdominal tenderness.  Musculoskeletal:        General: No swelling.     Cervical back: Neck supple.     Comments: No hip  tenderness, neurovasc intact, negative FABER, FADIR, no overlying erythema, warmth or wounds  Skin:    General: Skin is warm and dry.     Capillary Refill: Capillary refill takes less than 2 seconds.  Neurological:     Mental Status: He is alert.  Psychiatric:        Mood and Affect: Mood normal.     (all labs ordered are listed, but only abnormal results are displayed) Labs Reviewed  COMPREHENSIVE METABOLIC PANEL WITH GFR - Abnormal; Notable for the following components:      Result Value   Glucose, Bld 273 (*)    ALT 61 (*)    All other components within normal limits  CBC - Abnormal; Notable for the following components:   RBC 4.21 (*)    All other components within normal limits  URINE DRUG SCREEN - Abnormal; Notable for the following components:    Fentanyl  POSITIVE (*)    All other components within normal limits  ETHANOL    EKG: None  Radiology: No results found.   Procedures   Medications Ordered in the ED  polyethylene glycol (MIRALAX  / GLYCOLAX ) packet 17 Thomas (has no administration in time range)  hydrOXYzine  (ATARAX ) tablet 50 mg (has no administration in time range)    Clinical Course as of 11/26/23 1506  Sat Nov 26, 2023  1453 Patient evaluated for complaints of suicidal ideation.  Denies any HI.  No auditory visual hallucinations.  Has plan for fentanyl  overdose.  He is here voluntarily.  Complains of some intermittent ongoing right hip pain.  Otherwise without any medical complaints.  He is ambulatory and has a benign hip exam.  His lab work is without any significant abnormality.  Patient will be boarded for behavioral health evaluation. [JT]    Clinical Course User Index [JT] Joshua Lynwood DEL, PA-C                                 Medical Decision Making Amount and/or Complexity of Data Reviewed Labs: ordered.   This patient presents to the ED with chief complaint(s) of SI.  The complaint involves an extensive differential diagnosis and also carries with it a high risk of complications and morbidity.   Pertinent past medical history as listed in HPI  The differential diagnosis includes  Intoxication, withdrawal, Additional history obtained: Records reviewed Care Everywhere/External Records  Disposition:   Medically cleared.  Disposition pending behavioral health recommendations.  Social Determinants of Health:   Patient's unhoused  increases the complexity of managing their presentation  This note was dictated with voice recognition software.  Despite best efforts at proofreading, errors may have occurred which can change the documentation meaning.       Final diagnoses:  Suicidal ideation    ED Discharge Orders     None          Joshua Thomas Joshua Thomas 11/26/23 1506    Joshua Donnice PARAS, MD 11/26/23 [phone]

## 2023-11-26 NOTE — ED Notes (Signed)
 Pt has one pt  belonging bag in the cabinet behind 16-18

## 2023-11-26 NOTE — ED Triage Notes (Signed)
 Patient presents due to SI. He believes he is not getting help and is ready to give to up. He believes he needs to get into a detox program from Fentanyl . He admits to suboxone  and fentanyl  use in the last 24 hours. He plans to overdose in order to commit suicide. He reports is he does this he can at least have an open casket funeral for his family.

## 2023-11-26 NOTE — ED Notes (Addendum)
 RN attempted to call report was told not sure who is taking patient, RN left number for nurse to call back for report. Voluntary consent completed and EKG.

## 2023-11-26 NOTE — Consult Note (Signed)
 [facility] Psychiatric Consult Initial  Patient Name: .Joshua Thomas  MRN: [id number]  DOB: December 03, 1975  Consult Order details:  Orders (From admission, onward)     Start     Ordered   11/26/23 1505  CONSULT TO CALL ACT TEAM       Ordering Provider: [name], PA-C  Provider:  (Not yet assigned)  Question:  Place call to:  Answer:  TTS   11/26/23 1504             Mode of Visit: Tele-visit Virtual Statement:TELE PSYCHIATRY ATTESTATION & CONSENT As the provider for this telehealth consult, I attest that I verified the patient's identity using two separate identifiers, introduced myself to the patient, provided my credentials, disclosed my location, and performed this encounter via a HIPAA-compliant, real-time, face-to-face, two-way, interactive audio and video platform and with the full consent and agreement of the patient (or guardian as applicable.) Patient physical location: [facility]. Telehealth provider physical location: home office in state of [place].   Video start time: 1726 Video end time: 1759    Psychiatry Consult Evaluation  Service Date: November 26, 2023 LOS:  LOS: 0 days  Chief Complaint I'm feeling pretty bad.  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  MDD, recurrent, severe, without psychosis 3.  Homelessness  Assessment  Joshua Thomas is a 48 y.o. male admitted: Presented to the EDfor 11/26/2023  1:39 PM for  Per ED Provider Admission Assessment on 11/26/2023@1331 :   Patient presents with: Suicidal   Joshua Thomas is a 48 y.o. male with history of substance abuse presents with complaints of SI.  Has plans for fentanyl  overdose.  Has been under increased stressors recently and is more depressed with life circumstances.  Does complain of some ongoing right hip pain.  No numbness or tingling.  No fevers or chills.  No injury or trauma.  Pain is worse with ambulating.   He carries the  psychiatric diagnoses of substance-induced mood disorder, opioid use disorder, stimulant use disorder-amphetamine type, and has a past medical hx for homelessness, diabetes, hepatitis C, hypertension, diabetes, IV drug use complicated by osteomyelitis and COPD. Patient was medically cleared prior to psych assessment.   His current presentation of opioid dependence, feelings of hopelessness and worthlessness d/t his inability to stop using opiates; impaired sleep and appetite deficits; suicidal ideations with plan and verbalized intent to overdose on fentanyl  is most consistent with opioid dependence with opioid induced mood disorder. He meets criteria for psychiatric admission based on SI with plan and intent for self harm and inability to contract for safety.  Current outpatient psychotropic medications include suboxone , sertraline , seroquel  and hydroxyzine  and historically he has had a good response to these medications. He was not compliant with medications prior to admission as evidenced by patient reports.   Please see plan below for detailed recommendations.   Diagnoses:  Active Hospital problems: Principal Problem:   Opioid dependence with opioid-induced mood disorder (HCC) Active Problems:   MDD (major depressive disorder), recurrent severe, without psychosis (HCC)   Opioid use disorder, severe, dependence (HCC)   Homelessness    Plan   ## Psychiatric Medication Recommendations:  Pending EKG plan to restart home medications: Buprenorphine -naloxone  (SUBOXONE ) 8-2 mg per SL tablet 1 tablet Q12h Quetiapine  100mg  po at bedtime for sleep Sertraline  50mg  po daily for depression/anxiety Hydroxyzine  25mg  po Q6hrs prn anxiety Senna-Docusate tablet BID for constipation  COWS clonidine  protocol  ## Medical  Decision Making Capacity: Not specifically addressed in this encounter  ## Further Work-up:  -- EKG, start COWS protocol EKG, While pt on Qtc prolonging medications, please monitor &  replete K+ to 4 and Mg2+ to 2, or TOC consult for substance abuse resources -- Pertinent labwork reviewed earlier this admission includes: CMP, CBC, UDS,    ## Disposition:-- We recommend inpatient psychiatric hospitalization when medically cleared. Patient is under voluntary admission status at this time; please IVC if attempts to leave hospital.  ## Behavioral / Environmental: - No specific recommendations at this time.     ## Safety and Observation Level:  - Based on my clinical evaluation, I estimate the patient to be at low risk of self harm in the current setting. - At this time, we recommend  routine. This decision is based on my review of the chart including patient's history and current presentation, interview of the patient, mental status examination, and consideration of suicide risk including evaluating suicidal ideation, plan, intent, suicidal or self-harm behaviors, risk factors, and protective factors. This judgment is based on our ability to directly address suicide risk, implement suicide prevention strategies, and develop a safety plan while the patient is in the clinical setting. Please contact our team if there is a concern that risk level has changed.  CSSR Risk Category:C-SSRS RISK CATEGORY: High Risk  Suicide Risk Assessment: Patient has following modifiable risk factors for suicide: under treated depression , recklessness, medication noncompliance, current symptoms: anxiety/panic, insomnia, impulsivity, anhedonia, hopelessness, triggering events, and recent psychiatric hospitalization, which we are addressing by referring for inpatient psychiatric admission to restart medications for mood stability crisis stabilization and safety monitoring. Patient has following non-modifiable or demographic risk factors for suicide: male gender and psychiatric hospitalization Patient has the following protective factors against suicide: Frustration tolerance  Thank you for this consult  request. Recommendations have been communicated to the primary team.  We will sign off at this time.   Bernadette FORBES Barefoot, NP       History of Present Illness  Relevant Aspects of Hospital ED Course:  Admitted on 11/26/2023 for  Per RN Triage Note dated .11/26/2023@1342 : Patient presents due to SI. He believes he is not getting help and is ready to give to up. He believes he needs to get into a detox program from Fentanyl . He admits to suboxone  and fentanyl  use in the last 24 hours. He plans to overdose in order to commit suicide. He reports is he does this he can at least have an open casket funeral for his family.    Patient Report:  Patient observed seated on chair in private hallway, no one else present.  His appearance is disheveled, and he looks anxious, constantly rubbing his neck or face and looking in the opposite direction during most of the assessment. Patient greeted and given anticipatory guidance, he agrees to continue.  Of note, he reported he used fentanyl  that he received from a passerby on the streets prior to admission.  At present, he denies withdrawal symptoms but states he's expecting to experience, "nausea, body aches, diarrhea and feeling sweaty any minute now. " We discussed his allergies including Tylenol  and morphine , and pt informed COWS protocol ordered with clonidine  for withdrawals; also informed pending EKG to rule out prolonged QT intervals, his home medications will be restarted.     Patient presents cooperative and polite but appears quite uncomfortable. He is alert and oriented to self, location and events that led to is admission;  he  cannot state today date.  Patient endorses a long standing hx for polysubstance abuse and "fentanyl  use." He states reports he's been unable to abstain from use, which triggers feelings of hopelessness, worthlessness, and despair.  He states if he didn't come to the hospital today, he was going to take an excessive amount of fentanyl  and  go to sleep. He describes overdosing and a non-painful way to end his life.  He reports he was recently discharged from [facility] one month ago for similar concerns.  He reported feeling better at discharge but then relapsed upon returning home and stopped taking prescribed medications.  He states he sees Dr. Jerome for suboxone  but was unable to get a prescription d/t his urine being positive for cocaine.  He tells me today, he's unsure how that happened because he does not use cocaine.   Psych ROS:  Depression: yes Anxiety:  yes Mania (lifetime and current): denies Psychosis: (lifetime and current): denies  Collateral information:  Deferred   Review of Systems  Constitutional: Negative.   HENT: Negative.    Eyes: Negative.   Respiratory: Negative.    Cardiovascular: Negative.   Gastrointestinal:  Positive for diarrhea.  Genitourinary: Negative.   Musculoskeletal:  Negative for myalgias.  Neurological:  Negative for dizziness, seizures, loss of consciousness, weakness and headaches.  Endo/Heme/Allergies: Negative.   Psychiatric/Behavioral:  Positive for depression, substance abuse and suicidal ideas. The patient is nervous/anxious and has insomnia.      Psychiatric and Social History  Psychiatric History:  Information collected from patient and chart review  Prev Dx/Sx: PTSD, Major Depressive Disorder, ADHD,  OUD,stimulant use disorder, alchol use disorder Prior inpatient psychiatric treatment: he endorses many Prior outpatient psychiatric treatment: yes Current psychiatric provider: Dr. Heron Jerome Current therapist:he denies  Prior psych Hospitalization: yes  Prior Self Harm: endorses Prior Violence: denies  Family Psych History: polysubstance abuse Family Hx suicide: unknown  Social History:  Developmental Hx: unknown Educational Hx: unknown Occupational Hx: currently unemployed [employer] Officer Hx: deferred Living Situation: states he's homeless Spiritual Hx: none  reported Access to weapons/lethal means: he denies   Substance History Alcohol: yes, reports he used drink a lot in the past but currently in remission Tobacco: smokes cigarette daily Illicit Substance: fentanyl , cocaine, methamphetamine Prescription drug abuse: as listed above Rehab hx: yes, several  Exam Findings  Physical Exam:  Vital Signs:  Temp:  [98 F (36.7 C)-98.6 F (37 C)] 98.6 F (37 C) (11/15 1910) Pulse Rate:  [78-86] 78 (11/15 1910) Resp:  [16-18] 18 (11/15 1910) BP: (124-164)/(71-97) 124/82 (11/15 1910) SpO2:  [98 %-100 %] 99 % (11/15 1910) Weight:  [66.7 kg] 66.7 kg (11/15 1910) Blood pressure 133/71, pulse 81, temperature 98.3 F (36.8 C), temperature source Oral, resp. rate 17, SpO2 98%. There is no height or weight on file to calculate BMI.  Physical Exam Musculoskeletal:        General: Normal range of motion.     Cervical back: Normal range of motion.  Neurological:     Mental Status: He is alert. He is disoriented.  Psychiatric:        Attention and Perception: Attention and perception normal.        Mood and Affect: Mood is anxious and depressed. Affect is blunt.        Speech: Speech normal.        Behavior: Behavior is slowed. Behavior is cooperative.        Thought Content: Thought content is not paranoid or delusional.  Thought content includes suicidal ideation. Thought content does not include homicidal ideation. Thought content includes suicidal plan. Thought content does not include homicidal plan.        Cognition and Memory: Cognition normal. Memory is impaired.        Judgment: Judgment is impulsive.     Mental Status Exam: General Appearance: Disheveled  Orientation:  Other:  Oriented to self, location and current situation but cannot state today's date  Memory:  Immediate;   Fair Recent;   Fair Remote;   Fair  Concentration:  Concentration: Poor and Attention Span: Poor  Recall:  Fair  Attention  Poor  Eye Contact:  Fair   Speech:  Slow  Language:  Good  Volume:  Decreased  Mood: Dysphoric, I'm feeling pretty bad.  Affect:  Blunt, Congruent, and Depressed  Thought Process:  Coherent and Linear  Thought Content:  Illogical and Rumination  Suicidal Thoughts:  Yes.  with intent/plan  Homicidal Thoughts:  No  Judgement:  Poor  Insight:  Lacking  Psychomotor Activity:  Restlessness  Akathisia:  No  Fund of Knowledge:  Fair      Assets:  Field Seismologist  Cognition:  WNL  ADL's:  Impaired  AIMS (if indicated):        Other History   These have been pulled in through the EMR, reviewed, and updated if appropriate.  Family History:  The patient's family history includes Alcohol abuse in his father; Aneurysm in his father; Cirrhosis in his father; Diabetes in his mother.  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   COPD (chronic obstructive pulmonary disease) (HCC)    DDD (degenerative disc disease), lumbar    Degenerative disc disease, lumbar    Depression    Diabetes mellitus without complication (HCC)    Discitis    Epidural abscess 10/08/2021   GERD (gastroesophageal reflux disease)    Hepatitis C    Hepatitis C    History of kidney stones    History of stomach ulcers    Hx of diabetes mellitus    due to infection   Hypertension    Lung nodules    3 on L and 2 on R   Osteomyelitis of lumbar spine (HCC) 10/13/2022   Polysubstance abuse (HCC) 09/03/2022   PTSD (post-traumatic stress disorder)    Sciatica    Substance abuse [facility])     Surgical History: Past Surgical History:  Procedure Laterality  Date   FRACTURE SURGERY     HARDWARE REMOVAL  01/27/2012   Procedure: HARDWARE REMOVAL;  Surgeon: Lonni CINDERELLA Poli, MD;  Location: [facility];  Service: Orthopedics;  Laterality: Left;   IR LUMBAR DISC ASPIRATION W/IMG GUIDE  06/08/2017   IR LUMBAR DISC ASPIRATION W/IMG GUIDE  07/06/2021   LAPAROSCOPIC LYSIS OF ADHESIONS  05/24/2022   Procedure: LAPAROSCOPIC LYSIS OF ADHESIONS;  Surgeon: Mavis Anes, MD;  Location: [facility];  Service: General;;   RADIOLOGY WITH ANESTHESIA N/A 06/08/2017   Procedure: DISC ASPIRATION;  Surgeon: Dolphus Carrion, MD;  Location: [facility];  Service: Radiology;  Laterality: N/A;   TIBIA IM  NAIL INSERTION  01/27/2012   Procedure: INTRAMEDULLARY (IM) NAIL TIBIAL;  Surgeon: Lonni CINDERELLA Poli, MD;  Location: [facility];  Service: Orthopedics;  Laterality: Left;  Removal of IM Rod and Screws Left Tibia with Exchange Nail, Allograft Bone Graft Left Tibia   XI ROBOTIC ASSISTED INGUINAL HERNIA REPAIR WITH MESH Right 05/24/2022   Procedure: XI ROBOTIC ASSISTED INGUINAL HERNIA REPAIR WITH MESH;  Surgeon: Mavis Anes, MD;  Location: [facility];  Service: General;  Laterality: Right;     Medications:  No current facility-administered medications for this encounter. No current outpatient medications on file.  Facility-Administered Medications Ordered in Other Encounters:    alum & mag hydroxide-simeth (MAALOX/MYLANTA) 200-200-20 MG/5ML suspension 30 mL, 30 mL, Oral, Q4H PRN, Skarlett Sedlacek E, NP   benzocaine (ORAJEL) 10 % mucosal gel, , Mouth/Throat, QID PRN, Moishe Bernadette BRAVO, NP   [START ON 11/27/2023] buprenorphine -naloxone  (SUBOXONE ) 8-2 mg per SL tablet 1 tablet, 1 tablet, Sublingual, Q12H, Moishe Bernadette BRAVO, NP   [START ON 11/27/2023] chlorhexidine  (PERIDEX ) 0.12 % solution 15 mL, 15 mL, Mouth/Throat, BID, Moishe Bernadette E, NP   cloNIDine  (CATAPRES ) tablet 0.1 mg, 0.1 mg, Oral, QID **FOLLOWED BY** [START ON 11/29/2023] cloNIDine  (CATAPRES ) tablet 0.1 mg, 0.1 mg, Oral, BH-qamhs  **FOLLOWED BY** [START ON 12/01/2023] cloNIDine  (CATAPRES ) tablet 0.1 mg, 0.1 mg, Oral, QAC breakfast, Latavius Capizzi E, NP   dicyclomine  (BENTYL ) tablet 20 mg, 20 mg, Oral, Q6H PRN, Tyshay Adee E, NP   haloperidol  (HALDOL ) tablet 5 mg, 5 mg, Oral, TID PRN **AND** diphenhydrAMINE  (BENADRYL ) capsule 50 mg, 50 mg, Oral, TID PRN, Moishe Bernadette E, NP   haloperidol  lactate (HALDOL ) injection 5 mg, 5 mg, Intramuscular, TID PRN **AND** diphenhydrAMINE  (BENADRYL ) injection 50 mg, 50 mg, Intramuscular, TID PRN **AND** LORazepam  (ATIVAN ) injection 2 mg, 2 mg, Intramuscular, TID PRN, Moishe Bernadette E, NP   haloperidol  lactate (HALDOL ) injection 10 mg, 10 mg, Intramuscular, TID PRN **AND** diphenhydrAMINE  (BENADRYL ) injection 50 mg, 50 mg, Intramuscular, TID PRN **AND** LORazepam  (ATIVAN ) injection 2 mg, 2 mg, Intramuscular, TID PRN, Unique Sillas E, NP   hydrOXYzine  (ATARAX ) tablet 25 mg, 25 mg, Oral, Q6H PRN, Mikele Sifuentes E, NP   loperamide  (IMODIUM ) capsule 2-4 mg, 2-4 mg, Oral, PRN, Arlow Spiers E, NP   magnesium  hydroxide (MILK OF MAGNESIA) suspension 30 mL, 30 mL, Oral, Daily PRN, Mckensey Berghuis E, NP   methocarbamol  (ROBAXIN ) tablet 500 mg, 500 mg, Oral, Q8H PRN, Nial Hawe E, NP   naproxen  (NAPROSYN ) tablet 500 mg, 500 mg, Oral, BID PRN, Moishe Bernadette BRAVO, NP   [START ON 11/27/2023] nicotine  (NICODERM CQ  - dosed in mg/24 hours) patch 21 mg, 21 mg, Transdermal, Daily, Kennedy Brines E, NP   ondansetron  (ZOFRAN -ODT) disintegrating tablet 4 mg, 4 mg, Oral, Q6H PRN, Moishe Bernadette BRAVO, NP   [START ON 11/27/2023] polyethylene glycol (MIRALAX  / GLYCOLAX ) packet 17 g, 17 g, Oral, Daily, Abimbola Aki E, NP   QUEtiapine  (SEROQUEL ) tablet 100 mg, 100 mg, Oral, QHS, Terrel Nesheiwat E, NP   senna-docusate (Senokot-S) tablet 1 tablet, 1 tablet, Oral, BID, Moishe Bernadette E, NP   [START ON 11/27/2023] sertraline  (ZOLOFT ) tablet 50 mg, 50 mg, Oral, Daily, Moishe Bernadette BRAVO, NP  Allergies: Allergies  Allergen Reactions    Tylenol  [Acetaminophen ] Other (See Comments)    Due to Ulcers/ Liver Damage   Morphine  Itching    Bernadette BRAVO Moishe, NP

## 2023-11-26 NOTE — Progress Notes (Signed)
 Pt has order for EKG. Pt requested to complete EKG, Pt remained in his room. Pt refused EKG.

## 2023-11-27 DIAGNOSIS — F1124 Opioid dependence with opioid-induced mood disorder: Principal | ICD-10-CM

## 2023-11-27 LAB — GLUCOSE, CAPILLARY
Glucose-Capillary: 270 mg/dL — ABNORMAL HIGH (ref 70–99)
Glucose-Capillary: 225 mg/dL — ABNORMAL HIGH (ref 70–99)

## 2023-11-27 MED ORDER — INSULIN ASPART 100 UNIT/ML IJ SOLN
0.0000 [IU] | Freq: Three times a day (TID) | INTRAMUSCULAR | Status: DC
  Administered 2023-11-27: 8 [IU] via SUBCUTANEOUS
  Administered 2023-11-28: 5 [IU] via SUBCUTANEOUS
  Administered 2023-11-28: 2 [IU] via SUBCUTANEOUS
  Administered 2023-11-29 – 2023-11-30 (×3): 3 [IU] via SUBCUTANEOUS
  Administered 2023-11-30: 5 [IU] via SUBCUTANEOUS
  Administered 2023-12-01: 2 [IU] via SUBCUTANEOUS

## 2023-11-27 MED ORDER — BUPRENORPHINE HCL-NALOXONE HCL 8-2 MG SL SUBL
1.0000 | SUBLINGUAL_TABLET | Freq: Once | SUBLINGUAL | Status: AC
  Administered 2023-11-27: 1 via SUBLINGUAL
  Filled 2023-11-27: qty 1

## 2023-11-27 MED ORDER — WHITE PETROLATUM EX OINT
TOPICAL_OINTMENT | CUTANEOUS | Status: AC
  Administered 2023-11-27: 1
  Filled 2023-11-27: qty 5

## 2023-11-27 MED ORDER — ENSURE PLUS HIGH PROTEIN PO LIQD
237.0000 mL | Freq: Two times a day (BID) | ORAL | Status: DC
  Administered 2023-11-27 – 2023-12-01 (×8): 237 mL via ORAL
  Filled 2023-11-27 (×11): qty 237

## 2023-11-27 NOTE — Plan of Care (Signed)
   Problem: Education: Goal: Knowledge of [facility] Education information/materials will improve Outcome: Progressing Goal: Emotional status will improve Outcome: Progressing Goal: Mental status will improve Outcome: Progressing Goal: Verbalization of understanding the information provided will improve Outcome: Progressing

## 2023-11-27 NOTE — Plan of Care (Signed)
  Problem: Education: Goal: Mental status will improve Outcome: Not Progressing   Problem: Activity: Goal: Interest or engagement in activities will improve Outcome: Not Progressing

## 2023-11-27 NOTE — H&P (Signed)
 Psychiatric Admission Assessment Adult  Patient Identification: Joshua Thomas MRN:  [id number] Date of Evaluation:  11/27/2023 Chief Complaint:  Suicidal Ideation Principal Diagnosis: Opioid dependence with opioid-induced mood disorder (HCC) Diagnosis:  Principal Problem:   Opioid dependence with opioid-induced mood disorder (HCC)  History of Present Illness: The patient is a 48 y/o male with a history of opioid use disorder, stimulant use disorder (amphetamines), HTN, DM2, and depression who is currently homeless and was recently hospitalized at this facility from 10/4 - 10/14 after presenting with suicidal ideation and a plan to overdose on fentanyl . During that hospitalization he was managed with Suboxone  8-2 mg BID, quetiapine  100 mg at bedtime, and sertraline  50 mg daily. He was discharged back to homeless with the plan to continuing living in his vehicle. On 11/15 he presented to the ED endorsing suicidal ideation and a desire to detox from fentanyl . His UDS was negative for tested substances (which did not include fentanyl ). On assessment this morning he reported feeling tired but improved from the night prior. He stated that since the last admission he has been living in his car and had resumed use of fentanyl  multiple times per day, but denied any other substance use. He stated that he had again become increasingly depressed, feeling life was pointless. He felt like something in his life needed to change and that he could not continue to live out of his vehicle. He stated that his son lives in [place] and that he plans on staying with him for a period of time, but that his son is currently out of town on a work trip and will not be back for a couple of days. He stated that he is hopeful that his son will allow him to return to his home with him and that he also was interested in SUD treatment, specifically resuming treatment with buprenorphine , which he reports has been helpful in the past.    He endorsed currently experiencing increasing withdrawal symptoms, specifically nausea, loose stools, GI upset, body pains, rhinorrhea, diaphoresis, and restlessness. He denied any recent alcohol use or benzodiazepine use. He reported a depressed mood with anhedonia, lethargy, poor self-esteem, but denied suicidal ideation during assessment. He denied AVH.    Past Psychiatric History:  Previous psych diagnoses: ADHD, MDD, PTSD, OUD, stimulant use disorder, alchol use disorder Prior inpatient psychiatric treatment: several Prior outpatient psychiatric treatment: endorses Current psychiatric provider: [facility] Current therapist:denies Medications: Buprenorphine    History of suicide attempts: endorses History of homicide: endorses   Columbia Scale:  Flowsheet Row ED from 11/26/2023 in [facility] Emergency Department at [facility] Most recent reading at 11/26/2023  1:47 PM Ad[facility] (Discharged) [facility] Most recent reading at 10/15/2023  5:34 PM ED from 10/15/2023 in 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   COPD (chronic obstructive pulmonary disease) (HCC)    DDD (degenerative  disc disease), lumbar    Degenerative disc disease, lumbar    Depression    Diabetes mellitus without complication (HCC)    Discitis    Epidural abscess 10/08/2021   GERD (gastroesophageal reflux disease)     Hepatitis C    Hepatitis C    History of kidney stones    History of stomach ulcers    Hx of diabetes mellitus    due to infection   Hypertension    Lung nodules    3 on L and 2 on R   Osteomyelitis of lumbar spine (HCC) 10/13/2022   Polysubstance abuse (HCC) 09/03/2022   PTSD (post-traumatic stress disorder)    Sciatica    Substance abuse [facility])     Past Surgical History:  Procedure Laterality Date   FRACTURE SURGERY     HARDWARE REMOVAL  01/27/2012   Procedure: HARDWARE REMOVAL;  Surgeon: Lonni CINDERELLA Poli, MD;  Location: [facility];  Service: Orthopedics;  Laterality: Left;   IR LUMBAR DISC ASPIRATION W/IMG GUIDE  06/08/2017   IR LUMBAR DISC ASPIRATION W/IMG GUIDE  07/06/2021   LAPAROSCOPIC LYSIS OF ADHESIONS  05/24/2022   Procedure: LAPAROSCOPIC LYSIS OF ADHESIONS;  Surgeon: Mavis Anes, MD;  Location: [facility];  Service: General;;   RADIOLOGY WITH ANESTHESIA N/A 06/08/2017   Procedure: DISC ASPIRATION;  Surgeon: Dolphus Carrion, MD;  Location: [facility] OR;  Service: Radiology;  Laterality: N/A;   TIBIA IM NAIL INSERTION  01/27/2012   Procedure: INTRAMEDULLARY (IM) NAIL TIBIAL;  Surgeon: Lonni CINDERELLA Poli, MD;  Location: [facility];  Service: Orthopedics;  Laterality: Left;  Removal of IM Rod and Screws Left Tibia with Exchange Nail, Allograft Bone Graft Left Tibia   XI ROBOTIC ASSISTED INGUINAL HERNIA REPAIR WITH MESH Right 05/24/2022   Procedure: XI ROBOTIC ASSISTED INGUINAL HERNIA REPAIR WITH MESH;  Surgeon: Mavis Anes, MD;  Location: [facility];  Service: General;  Laterality: Right;   Family History:  Family History  Problem Relation Age of Onset   Diabetes Mother    Alcohol abuse Father    Cirrhosis Father    Aneurysm Father    Family Psychiatric  History: None reported  Tobacco Screening:  Social History   Tobacco Use  Smoking Status Every Day   Current packs/day: 1.50   Average packs/day: 1.5 packs/day for 33.0 years (49.5 ttl pk-yrs)   Types: Cigarettes  Smokeless  Tobacco Former   Quit date: 08/08/2006  Tobacco Comments   1 PPD    BH Tobacco Counseling     Are you interested in Tobacco Cessation Medications?  Yes, implement Nicotene Replacement Protocol Counseled patient on smoking cessation:  Refused/Declined practical counseling Reason Tobacco Screening Not Completed: Patient Refused Screening       Social History: Single, unemployed, homeless, living in his car recently, no reported income. Denies any access to weapons. No legal history reported.    Substance Use History: Alcohol: drank extensively in the past, in remission Tobacco: daily Illicit Substance: fentanyl , cocaine, methamphetamine  Allergies:   Allergies  Allergen Reactions   Tylenol  [Acetaminophen ] Other (See Comments)    Due to Ulcers/ Liver Damage   Morphine  Itching   Lab Results:  Results for orders placed or performed during the hospital encounter of 11/26/23 (from the past 48 hours)  Comprehensive metabolic panel     Status: Abnormal   Collection Time: 11/26/23  2:09 PM  Result Value Ref Range   Sodium 136 135 - 145 mmol/L   Potassium 4.2 3.5 - 5.1 mmol/L  Chloride 101 98 - 111 mmol/L   CO2 25 22 - 32 mmol/L   Glucose, Bld 273 (H) 70 - 99 mg/dL    Comment: Glucose reference range applies only to samples taken after fasting for at least 8 hours.   BUN 9 6 - 20 mg/dL   Creatinine, Ser 9.11 0.61 - 1.24 mg/dL   Calcium  9.8 8.9 - 10.3 mg/dL   Total Protein 7.2 6.5 - 8.1 g/dL   Albumin 3.8 3.5 - 5.0 g/dL   AST 41 15 - 41 U/L   ALT 61 (H) 0 - 44 U/L   Alkaline Phosphatase 64 38 - 126 U/L   Total Bilirubin 0.2 0.0 - 1.2 mg/dL   GFR, Estimated >39 >39 mL/min    Comment: (NOTE) Calculated using the CKD-EPI Creatinine Equation (2021)    Anion gap 11 5 - 15    Comment: Performed at [facility] [place], [address] [place], [place] [address]  Ethanol     Status: None   Collection Time: 11/26/23  2:09 PM  Result Value Ref Range   Alcohol, Ethyl  (B) <15 <15 mg/dL    Comment: (NOTE) For medical purposes only. Performed at [facility], [address], [place], [place] [address]   cbc     Status: Abnormal   Collection Time: 11/26/23  2:09 PM  Result Value Ref Range   WBC 4.0 4.0 - 10.5 K/uL   RBC 4.21 (L) 4.22 - 5.81 MIL/uL   Hemoglobin 13.0 13.0 - 17.0 g/dL   HCT 59.6 60.9 - 47.9 %   MCV 95.7 80.0 - 100.0 fL   MCH 30.9 26.0 - 34.0 pg   MCHC 32.3 30.0 - 36.0 g/dL   RDW 85.8 88.4 - 84.4 %   Platelets 193 150 - 400 K/uL   nRBC 0.0 0.0 - 0.2 %    Comment: Performed at [facility], [address] [place], [place] [address]  Rapid urine drug screen (hospital performed)     Status: Abnormal   Collection Time: 11/26/23  2:10 PM  Result Value Ref Range   Opiates NEGATIVE NEGATIVE   Cocaine NEGATIVE NEGATIVE   Benzodiazepines NEGATIVE NEGATIVE   Amphetamines NEGATIVE NEGATIVE   Tetrahydrocannabinol NEGATIVE NEGATIVE   Barbiturates NEGATIVE NEGATIVE   Methadone Scn, Ur NEGATIVE NEGATIVE   Fentanyl  POSITIVE (A) NEGATIVE    Comment: (NOTE) Drug screen is for Medical Purposes only. Positive results are preliminary only. If confirmation is needed, notify lab within 5 days.  Drug Class                 Cutoff (ng/mL) Amphetamine and metabolites 1000 Barbiturate and metabolites 200 Benzodiazepine              200 Opiates and metabolites     300 Cocaine and metabolites     300 THC                         50 Fentanyl                     5 Methadone                   300  Trazodone  is metabolized in vivo to several metabolites,  including pharmacologically active m-CPP, which is excreted in the  urine.  Immunoassay screens for amphetamines and MDMA have potential  cross-reactivity with these compounds and may provide false positive  result.  Performed at [facility], [address] [place], [place] [address]     Blood Alcohol level:  Lab Results  Component  Value Date   [facility] <15 11/26/2023   ETH <15 10/15/2023    Metabolic Disorder Labs:  Lab Results  Component Value Date   HGBA1C 6.7 (H) 10/17/2023   MPG 145.59 10/17/2023   MPG 162.81 09/04/2022   Lab Results  Component Value Date   PROLACTIN 31.3 (H) 05/23/2015   Lab Results  Component Value Date   CHOL 138 10/17/2023   TRIG 66 10/17/2023   HDL 30 (L) 10/17/2023   CHOLHDL 4.6 10/17/2023   VLDL 13 10/17/2023   LDLCALC 95 10/17/2023   LDLCALC 83 12/09/2020    Current Medications: Current Facility-Administered Medications  Medication Dose Route Frequency Provider Last Rate Last Admin   alum & mag hydroxide-simeth (MAALOX/MYLANTA) 200-200-20 MG/5ML suspension 30 mL  30 mL Oral Q4H PRN Mills, Shnese E, NP       benzocaine (ORAJEL) 10 % mucosal gel   Mouth/Throat QID PRN Moishe Bernadette BRAVO, NP       buprenorphine -naloxone  (SUBOXONE ) 8-2 mg per SL tablet 1 tablet  1 tablet Sublingual Q12H Moishe Bernadette E, NP   1 tablet at 11/27/23 1311   chlorhexidine  (PERIDEX ) 0.12 % solution 15 mL  15 mL Mouth/Throat BID Mills, Shnese E, NP       cloNIDine  (CATAPRES ) tablet 0.1 mg  0.1 mg Oral QID Mills, Shnese E, NP   0.1 mg at 11/27/23 1318   Followed by   NOREEN ON 11/29/2023] cloNIDine  (CATAPRES ) tablet 0.1 mg  0.1 mg Oral BH-qamhs Moishe Bernadette E, NP       Followed by   NOREEN ON 12/01/2023] cloNIDine  (CATAPRES ) tablet 0.1 mg  0.1 mg Oral QAC breakfast Mills, Shnese E, NP       dicyclomine  (BENTYL ) tablet 20 mg  20 mg Oral Q6H PRN Mills, Shnese E, NP       haloperidol  (HALDOL ) tablet 5 mg  5 mg Oral TID PRN Moishe Bernadette BRAVO, NP       And   diphenhydrAMINE  (BENADRYL ) capsule 50 mg  50 mg Oral TID PRN Mills, Shnese E, NP       haloperidol  lactate (HALDOL ) injection 5 mg  5 mg Intramuscular TID PRN Moishe Bernadette BRAVO, NP       And   diphenhydrAMINE  (BENADRYL ) injection 50 mg  50 mg Intramuscular TID PRN Moishe Bernadette BRAVO, NP       And   LORazepam  (ATIVAN ) injection 2 mg  2 mg Intramuscular TID PRN  Mills, Shnese E, NP       haloperidol  lactate (HALDOL ) injection 10 mg  10 mg Intramuscular TID PRN Moishe Bernadette BRAVO, NP       And   diphenhydrAMINE  (BENADRYL ) injection 50 mg  50 mg Intramuscular TID PRN Moishe Bernadette BRAVO, NP       And   LORazepam  (ATIVAN ) injection 2 mg  2 mg Intramuscular TID PRN Moishe Bernadette E, NP       feeding supplement (ENSURE PLUS HIGH PROTEIN) liquid 237 mL  237 mL Oral BID BM Faunce, Alina, DO   237 mL at 11/27/23 1434   hydrOXYzine  (ATARAX ) tablet 25 mg  25 mg Oral Q6H PRN Mills, Shnese E, NP       loperamide  (IMODIUM ) capsule 2-4 mg  2-4 mg Oral PRN Mills, Shnese E, NP       magnesium  hydroxide (MILK OF  MAGNESIA) suspension 30 mL  30 mL Oral Daily PRN Mills, Shnese E, NP       metFORMIN  (GLUCOPHAGE ) tablet 500 mg  500 mg Oral BID WC Mills, Shnese E, NP       methocarbamol  (ROBAXIN ) tablet 500 mg  500 mg Oral Q8H PRN Mills, Shnese E, NP       naproxen  (NAPROSYN ) tablet 500 mg  500 mg Oral BID PRN Mills, Shnese E, NP       nicotine  (NICODERM CQ  - dosed in mg/24 hours) patch 21 mg  21 mg Transdermal Daily Mills, Shnese E, NP       ondansetron  (ZOFRAN -ODT) disintegrating tablet 4 mg  4 mg Oral Q6H PRN Mills, Shnese E, NP       polyethylene glycol (MIRALAX  / GLYCOLAX ) packet 17 g  17 g Oral Daily Mills, Shnese E, NP       QUEtiapine  (SEROQUEL ) tablet 100 mg  100 mg Oral QHS Mills, Shnese E, NP       senna-docusate (Senokot-S) tablet 1 tablet  1 tablet Oral BID Mills, Shnese E, NP       sertraline  (ZOLOFT ) tablet 50 mg  50 mg Oral Daily Mills, Shnese E, NP       PTA Medications: Medications Prior to Admission  Medication Sig Dispense Refill Last Dose/Taking   benzocaine (ORAJEL) 10 % mucosal gel Use as directed in the mouth or throat 4 (four) times daily as needed for mouth pain.      buprenorphine -naloxone  (SUBOXONE ) 8-2 mg SUBL SL tablet Place 1 tablet under the tongue every 12 (twelve) hours. 2 tablet 0    chlorhexidine  (PERIDEX ) 0.12 % solution Use as directed 15 mLs  in the mouth or throat 2 (two) times daily.      hydrOXYzine  (ATARAX ) 50 MG tablet Take 1 tablet (50 mg total) by mouth 2 (two) times daily as needed for anxiety. 30 tablet 0    metFORMIN  (GLUCOPHAGE ) 500 MG tablet Take 1 tablet (500 mg total) by mouth 2 (two) times daily with a meal for 14 days. 28 tablet 0    naloxone  (NARCAN ) nasal spray 4 mg/0.1 mL Take in case of overdose 1 each 0    nicotine  (NICODERM CQ  - DOSED IN MG/24 HOURS) 21 mg/24hr patch Place 1 patch (21 mg total) onto the skin daily.      polyethylene glycol (MIRALAX  / GLYCOLAX ) 17 g packet Take 17 g by mouth daily.      QUEtiapine  (SEROQUEL ) 100 MG tablet Take 1 tablet (100 mg total) by mouth at bedtime. 30 tablet 0    senna-docusate (SENOKOT-S) 8.6-50 MG tablet Take 1 tablet by mouth 2 (two) times daily.      sertraline  (ZOLOFT ) 50 MG tablet Take 1 tablet (50 mg total) by mouth daily. 30 tablet 0     Musculoskeletal: Normal gait and station  Mental Status Exam: Appearance - Hospital scrubs, appropriate hygiene and grooming  Eye-Contact - Normal Attitude - Calm, polite, not guarded Speech - normal volume, prosody, inflection Mood - A little better Affect - Restricted Thought Process - LLGD Thought Content - No delusional TC expressed SI/HI - Denies  Perceptions - Denies AVH; not RIS Judgement/Insight - Fair Fund of knowledge - WNL Language - No impairments    Physical Exam Vitals reviewed.  Constitutional:      Appearance: Normal appearance. He is normal weight.  HENT:     Head: Normocephalic.  Eyes:     Extraocular Movements: Extraocular movements intact.  Pulmonary:     Effort: Pulmonary effort is normal.  Musculoskeletal:        General: Normal range of motion.  Neurological:     General: No focal deficit present.     Mental Status: He is alert and oriented to person, place, and time.    Review of Systems  Constitutional: Negative.   Respiratory: Negative.    Cardiovascular: Negative.    Blood  pressure 124/82, pulse 78, temperature 98.6 F (37 C), temperature source Oral, resp. rate 18, height 5' 3 (1.6 m), weight 66.7 kg, SpO2 99%. Body mass index is 26.04 kg/m.  Assessment and Plan: Mr. Keyonta Barradas is a 48 y/o male with a history significant for DM2, opioid and methamphetamine use disorders, HTN, and MDD, who presented to [facility] for suicidal ideation in the context of ongoing fentanyl  abuse and current withdrawal symptoms.   # Opioid use disorder, with opioid withdrawal, and opioid induced mood disorder - Buprenorphine  8-2 mg BID - Clonidine  taper - sertraline  50 mg daily - Quetiapine  100 mg at bedtime  # DM2 - metformin  500 mg BID w/ meals - Sliding scale  Observation Level/Precautions:  15 minute checks  Laboratory:  Notable for glucose 273 and negative UDS  Psychotherapy:  groups  Medications:  as above  Consultations:  SW  Discharge Concerns:  location?  Estimated LOS: 3-7 days     Physician Treatment Plan for Primary Diagnosis: Opioid dependence with opioid-induced mood disorder (HCC) Long Term Goal(s): Patient will be stable on MAT for opioid use disorder and abstain from opioid use.  Short Term Goals: Management and resolution of acute opioid withdrawal; resolution of suicidal ideation   I certify that inpatient services furnished can reasonably be expected to improve the patient's condition.    Oliva DELENA Salmon, DO 11/16/20253:40 PM

## 2023-11-27 NOTE — BH Assessment (Signed)
(  Sleep Hours) - 7.25 (Any PRNs that were needed, meds refused, or side effects to meds)-  (Any disturbances and when (visitation, over night)- None (Concerns raised by the patient)- None (SI/HI/AVH)- Denies

## 2023-11-27 NOTE — BHH Group Notes (Signed)
 Joshua Thomas did not attend wrap up group

## 2023-11-27 NOTE — Group Note (Signed)
 Date:  11/27/2023 Time:  6:49 PM   Group Topic/Focus:  Group used a non- competitive card game to build mindful listening skills, and encourage acceptance and understanding of oneself and peers. Patients cooperate and share, fostering empathy and personal growth in a supportive environment.      Participation Level:  Did Not Attend   Berwyn GORMAN Acosta 11/27/2023, 6:49 PM

## 2023-11-27 NOTE — Group Note (Signed)
 Date:  11/27/2023 Time:  9:09 PM  Group Topic/Focus:  Wrap-Up Group:   The focus of this group is to help patients review their daily goal of treatment and discuss progress on daily workbooks.    Participation Level:  Did Not Attend  Participation Quality:  N/A  Affect:  N/A  Cognitive:  N/A  Insight: None  Engagement in Group:  N/A  Modes of Intervention:  N/A  Additional Comments:  Patient was encouraged but did not attend   Eward Mace 11/27/2023, 9:09 PM

## 2023-11-27 NOTE — Progress Notes (Signed)
   11/27/23 1500  Psych Admission Type (Psych Patients Only)  Admission Status Voluntary  Psychosocial Assessment  Patient Complaints Anxiety;Irritability  Eye Contact Brief  Facial Expression Flat  Affect Anxious;Irritable  Speech Argumentative  Interaction Isolative;Hostile  Motor Activity Slow  Appearance/Hygiene Unremarkable  Behavior Characteristics Agitated;Agressive verbally  Mood Irritable;Preoccupied  Thought Process  Coherency WDL  Content WDL  Delusions None reported or observed  Perception WDL  Hallucination None reported or observed  Judgment Poor  Confusion None  Danger to Self  Current suicidal ideation? Passive  Description of Suicide Plan No Plan  Agreement Not to Harm Self Yes  Description of Agreement Verbal  Danger to Others  Danger to Others None reported or observed

## 2023-11-27 NOTE — Group Note (Signed)
 Date:  11/27/2023 Time:  9:21 AM  Group Topic/Focus:  Goals Group:   The focus of this group is to help patients establish daily goals to achieve during treatment and discuss how the patient can incorporate goal setting into their daily lives to aide in recovery.    Participation Level:  Did Not Attend   Custer Pimenta A Kelli Egolf 11/27/2023, 9:21 AM

## 2023-11-28 ENCOUNTER — Encounter (HOSPITAL_COMMUNITY): Payer: Self-pay

## 2023-11-28 LAB — GLUCOSE, CAPILLARY
Glucose-Capillary: 213 mg/dL — ABNORMAL HIGH (ref 70–99)
Glucose-Capillary: 143 mg/dL — ABNORMAL HIGH (ref 70–99)
Glucose-Capillary: 141 mg/dL — ABNORMAL HIGH (ref 70–99)

## 2023-11-28 LAB — HEMOGLOBIN A1C
Hgb A1c MFr Bld: 6.8 % — ABNORMAL HIGH (ref 4.8–5.6)
Mean Plasma Glucose: 148.46 mg/dL

## 2023-11-28 MED ORDER — BACITRACIN-NEOMYCIN-POLYMYXIN OINTMENT TUBE
TOPICAL_OINTMENT | Freq: Every day | CUTANEOUS | Status: DC
  Filled 2023-11-28: qty 14.17

## 2023-11-28 MED ORDER — PAROXETINE HCL 10 MG PO TABS
10.0000 mg | ORAL_TABLET | Freq: Every day | ORAL | Status: DC
  Administered 2023-11-28 – 2023-11-29 (×2): 10 mg via ORAL
  Filled 2023-11-28 (×2): qty 1

## 2023-11-28 MED ORDER — LIDOCAINE 5 % EX PTCH
1.0000 | MEDICATED_PATCH | Freq: Every day | CUTANEOUS | Status: DC
  Administered 2023-11-28 – 2023-11-30 (×3): 1 via TRANSDERMAL
  Filled 2023-11-28 (×4): qty 1

## 2023-11-28 MED ORDER — ENSURE PLUS HIGH PROTEIN PO LIQD: 237.0000 mL | Freq: Two times a day (BID) | ORAL | Status: DC

## 2023-11-28 NOTE — Plan of Care (Signed)
   Problem: Education: Goal: Knowledge of [facility] Education information/materials will improve Outcome: Progressing Goal: Emotional status will improve Outcome: Progressing Goal: Mental status will improve Outcome: Progressing Goal: Verbalization of understanding the information provided will improve Outcome: Progressing

## 2023-11-28 NOTE — Progress Notes (Signed)
 [facility] Inpatient Psychiatry Progress Note  Date: 11/28/2023 Patient: JSON KOELZER MRN: [id number]  ASSESSMENT: Mr. Oaklen Thiam is a 48 y/o male with a history significant for DM2, opioid and methamphetamine use disorders, HTN, and MDD, who presented to [facility] for suicidal ideation in the context of ongoing fentanyl  abuse and current withdrawal symptoms.   Patient with moderate symptoms of opioid withdrawal including nausea, diarrhea, chills, rhinorrhea this morning. On interview, mood is dysthymic with irritable affect, but no endorsement of suicidal ideation. Given patient reports limited effect of sertraline  on mood, will plan to discontinue. Will start Paxil 10 mg nightly with plan to titrate.  PLAN: # Opioid use disorder, with opioid withdrawal, and opioid induced mood disorder - Buprenorphine  8-2 mg BID - Clonidine  taper - Discontinue sertraline  - Start Paxil 10 mg nightly for depression - Seroquel  100 mg at bedtime - COWS protocol   # DM2 - Metformin  500 mg BID w/ meals - Sliding scale   Risk Assessment: Patient continues to require inpatient hospitalization for safety and stabilization of acute opioid withdrawal and resolution of suicidal ideation.  Discharge Planning: Barriers to Discharge: Acute opioid withdrawal and suicidal ideation Estimated Length of Stay: 3-5 days Predicted Discharge Location: TBD, possibly son's home  INTERVAL HISTORY: Chart reviewed. No significant events overnight. On assessment today, the patient reports he is not doing well. Endorses symptoms of opioid withdrawal including nausea, diarrhea, and chills. He denies suicidal or homicidal ideation. He denies auditory or visual hallucinations. He says his goal for hospitalization is to feel physically and mentally better, so that he can stay with his son following discharge, and long-term goal is to stop using fentanyl . States he has not talked to his son. States if he cannot go  to stay with his son, he might be interested in a substance-use rehab program. Patient states he does not believe Zoloft  is helping his mood. Reports low-back pain and right knee pain.   PRN's in the last 24 hours include hydroxyzine .  Physical Examination:  Vitals and nursing note reviewed MSK: Normal gait and station  MENTAL STATUS EXAM:  Appearance: Male wearing hospital scrubs, appropriate hygiene and grooming  Behavior: cooperative  Attitude: Irritable  Speech: normal rate, rhythm, tone  Mood: Not good  Affect: dysthymic, congruent  Thought Process: linear, logical, goal-directed  Thought Content: no delusional thought content endorsed; not observed responding to internal stimuli  SI/HI: denies  Perceptions: denies AVH  Judgement: Fair  Insight: Fair  Fund of Knowledge: WNL   Lab Results:  Admission on 11/26/2023  Component Date Value Ref Range Status   Glucose-Capillary 11/27/2023 270 (H)  70 - 99 mg/dL Final   Glucose-Capillary 11/27/2023 225 (H)  70 - 99 mg/dL Final     Vitals: Blood pressure 92/78, pulse (!) 57, temperature 97.7 F (36.5 C), temperature source Oral, resp. rate 16, height 5' 3 (1.6 m), weight 66.7 kg, SpO2 99%.    Ashley Gravely PGY-1, Psychiatry Residency  11/28/2023, 9:25 AM

## 2023-11-28 NOTE — Group Note (Signed)
 Date:  11/28/2023 Time:  11:26 AM  Group Topic/Focus: Emotional wellness Emotional Education:   The focus of this group is to discuss what feelings/emotions are, and how they are experienced.    Participation Level:  Did Not Attend   Joshua Thomas 11/28/2023, 11:26 AM

## 2023-11-28 NOTE — Group Note (Signed)
 Date:  11/28/2023 Time:  2:04 PM  Group Topic/Focus: [place] Spirituality:   The focus of this group is to discuss how one's spirituality can aide in recovery.    Participation Level:  Did Not Attend   Joshua Thomas 11/28/2023, 2:04 PM

## 2023-11-28 NOTE — Plan of Care (Signed)

## 2023-11-28 NOTE — BH IP Treatment Plan (Signed)
 Interdisciplinary Treatment and Diagnostic Plan Update  11/28/2023 Time of Session: 10:45 AM Joshua Thomas MRN: [id number]  Principal Diagnosis: Opioid dependence with opioid-induced mood disorder (HCC)  Secondary Diagnoses: Principal Problem:   Opioid dependence with opioid-induced mood disorder (HCC) Active Problems:   Homelessness   Current Medications:  Current Facility-Administered Medications  Medication Dose Route Frequency Provider Last Rate Last Admin   alum & mag hydroxide-simeth (MAALOX/MYLANTA) 200-200-20 MG/5ML suspension 30 mL  30 mL Oral Q4H PRN Mills, Shnese E, NP       benzocaine (ORAJEL) 10 % mucosal gel   Mouth/Throat QID PRN Mills, Shnese E, NP       buprenorphine -naloxone  (SUBOXONE ) 8-2 mg per SL tablet 1 tablet  1 tablet Sublingual Q12H Moishe Burton E, NP   1 tablet at 11/28/23 0841   chlorhexidine  (PERIDEX ) 0.12 % solution 15 mL  15 mL Mouth/Throat BID Moishe Burton E, NP   15 mL at 11/28/23 0840   cloNIDine  (CATAPRES ) tablet 0.1 mg  0.1 mg Oral QID Mills, Shnese E, NP   0.1 mg at 11/28/23 1157   Followed by   NOREEN ON 11/29/2023] cloNIDine  (CATAPRES ) tablet 0.1 mg  0.1 mg Oral BH-qamhs Moishe Burton E, NP       Followed by   NOREEN ON 12/01/2023] cloNIDine  (CATAPRES ) tablet 0.1 mg  0.1 mg Oral QAC breakfast Mills, Shnese E, NP       dicyclomine  (BENTYL ) tablet 20 mg  20 mg Oral Q6H PRN Mills, Shnese E, NP       haloperidol  (HALDOL ) tablet 5 mg  5 mg Oral TID PRN Moishe Burton BRAVO, NP       And   diphenhydrAMINE  (BENADRYL ) capsule 50 mg  50 mg Oral TID PRN Mills, Shnese E, NP       haloperidol  lactate (HALDOL ) injection 5 mg  5 mg Intramuscular TID PRN Moishe Burton BRAVO, NP       And   diphenhydrAMINE  (BENADRYL ) injection 50 mg  50 mg Intramuscular TID PRN Moishe Burton BRAVO, NP       And   LORazepam  (ATIVAN ) injection 2 mg  2 mg Intramuscular TID PRN Mills, Shnese E, NP       haloperidol  lactate (HALDOL ) injection 10 mg  10 mg Intramuscular TID PRN Moishe Burton  E, NP       And   diphenhydrAMINE  (BENADRYL ) injection 50 mg  50 mg Intramuscular TID PRN Moishe Burton E, NP       And   LORazepam  (ATIVAN ) injection 2 mg  2 mg Intramuscular TID PRN Moishe Burton E, NP       feeding supplement (ENSURE PLUS HIGH PROTEIN) liquid 237 mL  237 mL Oral BID BM Faunce, Alina, DO   237 mL at 11/28/23 1353   hydrOXYzine  (ATARAX ) tablet 25 mg  25 mg Oral Q6H PRN Mills, Shnese E, NP   25 mg at 11/27/23 2126   insulin  aspart (novoLOG ) injection 0-15 Units  0-15 Units Subcutaneous TID WC Bouchard, Marc A, DO   5 Units at 11/28/23 1157   lidocaine  (LIDODERM ) 5 % 1 patch  1 patch Transdermal Daily Mannie Ashley SAILOR, MD   1 patch at 11/28/23 1042   loperamide  (IMODIUM ) capsule 2-4 mg  2-4 mg Oral PRN Mills, Shnese E, NP       magnesium  hydroxide (MILK OF MAGNESIA) suspension 30 mL  30 mL Oral Daily PRN Mills, Shnese E, NP       metFORMIN  (GLUCOPHAGE ) tablet 500 mg  500 mg Oral BID WC Mills, Shnese E, NP   500 mg at 11/28/23 0841   methocarbamol  (ROBAXIN ) tablet 500 mg  500 mg Oral Q8H PRN Mills, Shnese E, NP       naproxen  (NAPROSYN ) tablet 500 mg  500 mg Oral BID PRN Mills, Shnese E, NP       neomycin -bacitracin -polymyxin (NEOSPORIN) ointment   Topical Daily Mannie Ashley SAILOR, MD   Given at 11/28/23 1041   nicotine  (NICODERM CQ  - dosed in mg/24 hours) patch 21 mg  21 mg Transdermal Daily Mills, Shnese E, NP   21 mg at 11/28/23 9158   ondansetron  (ZOFRAN -ODT) disintegrating tablet 4 mg  4 mg Oral Q6H PRN Mills, Shnese E, NP       PARoxetine (PAXIL) tablet 10 mg  10 mg Oral QHS Parker, Alvin S, MD       polyethylene glycol (MIRALAX  / GLYCOLAX ) packet 17 g  17 g Oral Daily Mills, Shnese E, NP       QUEtiapine  (SEROQUEL ) tablet 100 mg  100 mg Oral QHS Mills, Shnese E, NP   100 mg at 11/27/23 2126   senna-docusate (Senokot-S) tablet 1 tablet  1 tablet Oral BID Moishe Bernadette BRAVO, NP   1 tablet at 11/28/23 0841   PTA Medications: Medications Prior to Admission  Medication Sig  Dispense Refill Last Dose/Taking   benzocaine (ORAJEL) 10 % mucosal gel Use as directed in the mouth or throat 4 (four) times daily as needed for mouth pain.      buprenorphine -naloxone  (SUBOXONE ) 8-2 mg SUBL SL tablet Place 1 tablet under the tongue every 12 (twelve) hours. 2 tablet 0    chlorhexidine  (PERIDEX ) 0.12 % solution Use as directed 15 mLs in the mouth or throat 2 (two) times daily.      hydrOXYzine  (ATARAX ) 50 MG tablet Take 1 tablet (50 mg total) by mouth 2 (two) times daily as needed for anxiety. 30 tablet 0    metFORMIN  (GLUCOPHAGE ) 500 MG tablet Take 1 tablet (500 mg total) by mouth 2 (two) times daily with a meal for 14 days. 28 tablet 0    naloxone  (NARCAN ) nasal spray 4 mg/0.1 mL Take in case of overdose 1 each 0    nicotine  (NICODERM CQ  - DOSED IN MG/24 HOURS) 21 mg/24hr patch Place 1 patch (21 mg total) onto the skin daily.      polyethylene glycol (MIRALAX  / GLYCOLAX ) 17 g packet Take 17 g by mouth daily.      QUEtiapine  (SEROQUEL ) 100 MG tablet Take 1 tablet (100 mg total) by mouth at bedtime. 30 tablet 0    senna-docusate (SENOKOT-S) 8.6-50 MG tablet Take 1 tablet by mouth 2 (two) times daily.      sertraline  (ZOLOFT ) 50 MG tablet Take 1 tablet (50 mg total) by mouth daily. 30 tablet 0     Patient Stressors:    Patient Strengths:    Treatment Modalities: Medication Management, Group therapy, Case management,  1 to 1 session with clinician, Psychoeducation, Recreational therapy.   Physician Treatment Plan for Primary Diagnosis: Opioid dependence with opioid-induced mood disorder (HCC) Long Term Goal(s):     Short Term Goals:    Medication Management: Evaluate patient's response, side effects, and tolerance of medication regimen.  Therapeutic Interventions: 1 to 1 sessions, Unit Group sessions and Medication administration.  Evaluation of Outcomes: Not Progressing  Physician Treatment Plan for Secondary Diagnosis: Principal Problem:   Opioid dependence with  opioid-induced mood disorder (HCC) Active Problems:  Homelessness  Long Term Goal(s):     Short Term Goals:       Medication Management: Evaluate patient's response, side effects, and tolerance of medication regimen.  Therapeutic Interventions: 1 to 1 sessions, Unit Group sessions and Medication administration.  Evaluation of Outcomes: Not Progressing   RN Treatment Plan for Primary Diagnosis: Opioid dependence with opioid-induced mood disorder (HCC) Long Term Goal(s): Knowledge of disease and therapeutic regimen to maintain health will improve  Short Term Goals: Ability to remain free from injury will improve, Ability to verbalize frustration and anger appropriately will improve, Ability to demonstrate self-control, Ability to participate in decision making will improve, Ability to verbalize feelings will improve, Ability to disclose and discuss suicidal ideas, Ability to identify and develop effective coping behaviors will improve, and Compliance with prescribed medications will improve  Medication Management: RN will administer medications as ordered by provider, will assess and evaluate patient's response and provide education to patient for prescribed medication. RN will report any adverse and/or side effects to prescribing provider.  Therapeutic Interventions: 1 on 1 counseling sessions, Psychoeducation, Medication administration, Evaluate responses to treatment, Monitor vital signs and CBGs as ordered, Perform/monitor CIWA, COWS, AIMS and Fall Risk screenings as ordered, Perform wound care treatments as ordered.  Evaluation of Outcomes: Not Progressing   LCSW Treatment Plan for Primary Diagnosis: Opioid dependence with opioid-induced mood disorder (HCC) Long Term Goal(s): Safe transition to appropriate next level of care at discharge, Engage patient in therapeutic group addressing interpersonal concerns.  Short Term Goals: Engage patient in aftercare planning with referrals and  resources, Increase social support, Increase ability to appropriately verbalize feelings, Increase emotional regulation, Facilitate acceptance of mental health diagnosis and concerns, Facilitate patient progression through stages of change regarding substance use diagnoses and concerns, Identify triggers associated with mental health/substance abuse issues, and Increase skills for wellness and recovery  Therapeutic Interventions: Assess for all discharge needs, 1 to 1 time with Social worker, Explore available resources and support systems, Assess for adequacy in community support network, Educate family and significant other(s) on suicide prevention, Complete Psychosocial Assessment, Interpersonal group therapy.  Evaluation of Outcomes: Not Progressing   Progress in Treatment: Attending groups: No. Participating in groups: No. Taking medication as prescribed: Yes. Toleration medication: Yes. Family/Significant other contact made: No, will contact:  Sherlean Arenas (son) [phone].  Patient understands diagnosis: Yes. Discussing patient identified problems/goals with staff: Yes. Medical problems stabilized or resolved: Yes. Denies suicidal/homicidal ideation: Yes. Issues/concerns per patient self-inventory: No. None reported.  New problem(s) identified: No, Describe:  None identified.  New Short Term/Long Term Goal(s): detox, medication management for mood stabilization; elimination of SI thoughts; development of comprehensive mental wellness/sobriety plan   Patient Goals: Trying to get healthy. Get back on my meds and get someone I can talk to.  Discharge Plan or Barriers: Patient recently admitted. CSW will continue to follow and assess for appropriate referrals and possible discharge planning.    Reason for Continuation of Hospitalization: Anxiety Depression Medication stabilization Withdrawal symptoms  Estimated Length of Stay: 3-5 days.  Last 3 [place] Suicide Severity  Risk Score: Flowsheet Row ED from 11/26/2023 in [facility] Emergency Department at [facility] Most recent reading at 11/26/2023  1:47 PM [facility] ([facility] Most recent reading at 10/15/2023  5:34 PM ED from 10/15/2023 in [facility] Emergency Department at [facility] Most recent reading at 10/15/2023  1:32 PM  C-SSRS RISK CATEGORY High Risk Moderate Risk  High Risk    Last PHQ 2/9 Scores:    09/16/2022    9:41 AM 09/06/2022   12:43 PM 09/04/2022   12:57 AM  Depression screen PHQ 2/9  Decreased Interest 0 0 1  Down, Depressed, Hopeless 0 0 1  PHQ - 2 Score 0 0 2  Altered sleeping   1  Tired, decreased energy   1  Change in appetite   0  Feeling bad or failure about yourself    0  Trouble concentrating   1  Moving slowly or fidgety/restless   0  Suicidal thoughts   0  PHQ-9 Score   5   Difficult doing work/chores   Somewhat difficult     Data saved with a previous flowsheet row definition    Scribe for Treatment Team: Louetta Wynona SILK 11/28/2023 2:33 PM

## 2023-11-28 NOTE — Group Note (Signed)
 Date:  11/28/2023 Time:  10:16 AM  Group Topic/Focus: Goals group orientation Goals Group:   The focus of this group is to help patients establish daily goals to achieve during treatment and discuss how the patient can incorporate goal setting into their daily lives to aide in recovery.    Participation Level:  Did Not Attend   Joshua Thomas 11/28/2023, 10:16 AM

## 2023-11-28 NOTE — Group Note (Signed)
 Recreation Therapy Group Note   Group Topic:Communication  Group Date: 11/28/2023 Start Time: 0945 End Time: 1020 Facilitators: Shaquel Josephson-McCall, LRT,CTRS Location: [address] Dayroom   Group Topic: Communication, Problem Solving   Goal Area(s) Addresses:  Patient will effectively listen to complete activity.  Patient will identify communication skills used to make activity successful.  Patient will identify how skills used during activity can be used to reach post d/c goals.    Behavioral Response:    Intervention: Building Surveyor Activity - Geometric pattern cards, pencils, blank paper    Activity: Geometric Drawings.  Three volunteers from the peer group will be shown an abstract picture with a particular arrangement of geometrical shapes.  Each round, one 'speaker' will describe the pattern, as accurately as possible without revealing the image to the group.  The remaining group members will listen and draw the picture to reflect how it is described to them. Patients with the role of 'listener' cannot ask clarifying questions but, may request that the speaker repeat a direction. Once the drawings are complete, the presenter will show the rest of the group the picture and compare how close each person came to drawing the picture. LRT will facilitate a post-activity discussion regarding effective communication and the importance of planning, listening, and asking for clarification in daily interactions with others.  Education: Environmental consultant, Active listening, Support systems, Discharge planning  Education Outcome: Acknowledges understanding/In group clarification offered/Needs additional education.    Affect/Mood: N/A   Participation Level: Did not attend    Clinical Observations/Individualized Feedback:      Plan: Continue to engage patient in RT group sessions 2-3x/week.   Jerauld Bostwick-McCall, LRT,CTRS  11/28/2023 1:22 PM

## 2023-11-28 NOTE — Group Note (Signed)
 Date:  11/28/2023 Time:  11:00 AM  Group Topic/Focus: Group Topic/Focus: Recreational Therapy  Patients played blind picturing  where the speaker describes the geometrical image in detail then the drawer has to recreate the image of the picture.      Participation Level:  Did Not Attend  Joshua Thomas 11/28/2023, 11:00 AM

## 2023-11-28 NOTE — Progress Notes (Signed)
   11/28/23 1400  Psych Admission Type (Psych Patients Only)  Admission Status Voluntary  Psychosocial Assessment  Patient Complaints Isolation  Eye Contact Fair  Facial Expression Flat  Affect Labile  Speech Logical/coherent  Interaction Isolative  Motor Activity Slow  Appearance/Hygiene Unremarkable  Behavior Characteristics Unwilling to participate;Calm  Mood Depressed;Anxious  Thought Process  Coherency WDL  Content WDL  Delusions None reported or observed  Perception WDL  Hallucination None reported or observed  Judgment Poor  Confusion None  Danger to Self  Current suicidal ideation? Passive  Description of Suicide Plan no plan  Self-Injurious Behavior No self-injurious ideation or behavior indicators observed or expressed   Agreement Not to Harm Self Yes  Description of Agreement verbal  Danger to Others  Danger to Others None reported or observed

## 2023-11-28 NOTE — BH Assessment (Signed)
(  Sleep Hours) - 7.5 (Any PRNs that were needed, meds refused, or side effects to meds)-  (Any disturbances and when (visitation, over night)- None (Concerns raised by the patient)- Pt refused morning labs and BS check. Pt said, You want me to give blood when I got up earlier for something to drink. (SI/HI/AVH)- Denies

## 2023-11-28 NOTE — Group Note (Signed)
 Date:  11/28/2023 Time:  1:38 PM  Group Topic/Focus: Physical wellness Physical wellness and mental health are deeply connected. When you take care of your body, you support your brain's ability to manage stress, regulate emotions, and function more effectively. Here's how physical wellness boosts mental well-being--and how to start improving both:      Participation Level:  Did Not Attend   Joshua Thomas 11/28/2023, 1:38 PM
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
## 2023-11-28 NOTE — Plan of Care (Signed)
   Problem: Education: Goal: Knowledge of [place] [facility] Education information/materials will improve Outcome: Progressing Goal: Emotional status will improve Outcome: Progressing Goal: Mental status will improve Outcome: Progressing

## 2023-11-29 LAB — GLUCOSE, CAPILLARY
Glucose-Capillary: 160 mg/dL — ABNORMAL HIGH (ref 70–99)
Glucose-Capillary: 197 mg/dL — ABNORMAL HIGH (ref 70–99)
Glucose-Capillary: 218 mg/dL — ABNORMAL HIGH (ref 70–99)

## 2023-11-29 LAB — VITAMIN D 25 HYDROXY (VIT D DEFICIENCY, FRACTURES): Vit D, 25-Hydroxy: 32.1 ng/mL (ref 30–100)

## 2023-11-29 LAB — RPR: RPR Ser Ql: NONREACTIVE

## 2023-11-29 LAB — TSH: TSH: 1.11 u[IU]/mL (ref 0.350–4.500)

## 2023-11-29 LAB — FOLATE: Folate: 12.4 ng/mL (ref >5.9)

## 2023-11-29 LAB — VITAMIN B12: Vitamin B-12: 429 pg/mL (ref 180–914)

## 2023-11-29 MED ORDER — PSEUDOEPHEDRINE HCL ER 120 MG PO TB12
120.0000 mg | ORAL_TABLET | Freq: Two times a day (BID) | ORAL | Status: DC
  Administered 2023-11-29 – 2023-12-01 (×5): 120 mg via ORAL
  Filled 2023-11-29 (×7): qty 1

## 2023-11-29 NOTE — Group Note (Signed)
 Date:  11/29/2023 Time:  5:02 PM  Group Topic/Focus:  Self Care:   The focus of this group is to help patients understand sleep hygiene and the importance of self-care in order to improve or restore emotional, physical, spiritual, interpersonal, and financial health.    Additional Comments:  patient did not attend  Joshua Thomas 11/29/2023, 5:02 PM

## 2023-11-29 NOTE — Progress Notes (Signed)
   11/29/23 0900  Psych Admission Type (Psych Patients Only)  Admission Status Voluntary  Psychosocial Assessment  Patient Complaints Isolation  Eye Contact Fair  Facial Expression Flat  Affect Labile  Speech Logical/coherent  Interaction Isolative  Motor Activity Slow  Appearance/Hygiene In scrubs  Behavior Characteristics Unwilling to participate;Calm  Mood Depressed;Anxious  Thought Process  Coherency WDL  Content WDL  Delusions None reported or observed  Perception WDL  Hallucination None reported or observed  Judgment Poor  Confusion None  Danger to Self  Current suicidal ideation? Passive  Description of Suicide Plan no plan  Self-Injurious Behavior Some self-injurious ideation observed or expressed.  No lethal plan expressed   Agreement Not to Harm Self Yes  Description of Agreement verbal  Danger to Others  Danger to Others None reported or observed

## 2023-11-29 NOTE — Group Note (Signed)
 Date:  11/29/2023 Time:  10:06 AM  Group Topic/Focus: Coping and goals group  Goals Group:   The focus of this group is to help patients establish daily goals to achieve during treatment and discuss how the patient can incorporate goal setting into their daily lives to aide in recovery. Coping skills for patients (more respectfully called people living with mental health conditions) are tools, strategies, or behaviors that help a person manage stress, difficult emotions, symptoms, and challenging situations.    Joshua Thomas 11/29/2023, 10:06 AM

## 2023-11-29 NOTE — [facility] Group Notes (Signed)
 [facility] Group Notes:  (Nursing/MHT/Case Management/Adjunct)  Date:  11/29/2023  Time:  10:11 PM  Type of Therapy:  Psychoeducational Skills  Participation Level:  Active  Participation Quality:  Attentive  Affect:  Depressed  Cognitive:  Appropriate  Insight:  Limited  Engagement in Group:  Limited  Modes of Intervention:  Education  Summary of Progress/Problems: Patient states that this is his first time out of his bedroom. His goal for today was to attend one group which he accomplished. His goal for tomorrow is to make phone calls to set up his discharge plans.   Joshua Thomas S 11/29/2023, 10:11 PM

## 2023-11-29 NOTE — Group Note (Signed)
 Recreation Therapy Group Note   Group Topic:Animal Assisted Therapy   Group Date: 11/29/2023 Start Time: 0945 End Time: 1030 Facilitators: Helen Cuff-McCall, LRT,CTRS Location: [address] Dayroom   Animal-Assisted Activity (AAA) Program Checklist/Progress Notes Patient Eligibility Criteria Checklist & Daily Group note for Rec Tx Intervention  AAA/T Program Assumption of Risk Form signed by Patient/ or Parent Legal Guardian Yes  Patient understands his/her participation is voluntary Yes  Behavioral Response:    Education: Charity Fundraiser, Appropriate Animal Interaction   Education Outcome: Acknowledges education.    Affect/Mood: N/A   Participation Level: Did not attend    Clinical Observations/Individualized Feedback:     Plan: Continue to engage patient in RT group sessions 2-3x/week.   Jovonna Nickell-McCall, LRT,CTRS 11/29/2023 12:49 PM

## 2023-11-29 NOTE — Group Note (Signed)
 Date:  11/29/2023 Time:  10:27 AM  Group Topic/Focus: Pet therapy is a therapeutic approach where trained animals--most commonly dogs, cats, horses, or even rabbits--are used to help improve the emotional, social, and mental well-being of people living with mental health conditions.    Participation Level:  Did Not Attend   Dolores CHRISTELLA Fredericks 11/29/2023, 10:27 AM

## 2023-11-29 NOTE — Plan of Care (Signed)

## 2023-11-29 NOTE — Group Note (Signed)
 Date:  11/29/2023 Time:  12:17 PM  Group Topic/Focus: Social work  Social work helps clients with low confidence by assessing challenges, empowering them through skills and support, fostering self-esteem, and helping them navigate social and printmaker. Confidence is both a goal and a tool for mental health recovery.    Participation Level:  Did Not Attend   Joshua Thomas 11/29/2023, 12:17 PM

## 2023-11-29 NOTE — Group Note (Signed)
 LCSW Group Therapy Note   Group Date: 11/29/2023 Start Time: 1100 End Time: 1200   Participation:  did not attend  Type of Therapy:  Group Therapy  Topic:  Shining from Within:  Confidence and Self-Love Journey   Objective:  To support participants in developing confidence and self-love through self-awareness, self-compassion, and practical skills that nurture personal growth.   Group Goals Encourage self-reflection and self-acceptance by identifying personal strengths and achievements. Teach skills to challenge negative self-talk and replace it with supportive, truthful self-talk. Foster resilience and self-worth through owens & minor, gratitude, and self-care practices.   Summary:  This group explores the connection between confidence and self-love by guiding participants through reflection, mindset shifts, and practical tools like affirmations, strength recognition, and goal-setting. Activities are designed to promote self-compassion, build emotional resilience, and normalize the slow, patient journey of inner growth.   Therapeutic Modalities Used Cognitive Behavioral Therapy (CBT): Challenging and reframing unhelpful self-talk. Motivational Interviewing (MI): Encouraging small, achievable goals. Elements of Dialectical Behavioral Therapist (DBT):  Mindfulness and Self-Compassion: Promoting present-moment awareness and kindness toward self.   Chimere Klingensmith O Delmar Arriaga, LCSWA 11/29/2023  12:24 PM

## 2023-11-29 NOTE — Progress Notes (Signed)
 [facility] Inpatient Psychiatry Progress Note  Date: 11/29/2023 Patient: NAYTHEN HEIKKILA MRN: [id number]  ASSESSMENT: Mr. Abigail Marsiglia is a 48 y/o male with a history significant for DM2, opioid and methamphetamine use disorders, HTN, and MDD, who presented to [facility] for suicidal ideation in the context of ongoing fentanyl  abuse and current withdrawal symptoms.   Patient with improved symptoms of opioid withdrawal, no longer reporting nausea diarrhea or chills. Patient still with passive suicidal ideation and depressed mood that appears primarily situational in nature related to housing insecurity. Tolerating Paxil well, we will plan to continue current dose with plan to titrate as able.   Patient also complaining of nasal congestion, will order Sudafed.  PLAN: # Opioid use disorder, with opioid withdrawal, and opioid induced mood disorder - Buprenorphine  8-2 mg BID - Clonidine  taper - Continue Paxil 10 mg nightly for depression, plan to titrate as able - Seroquel  100 mg at bedtime - Discontinued home sertraline  - COWS protocol   # DM2 - Metformin  500 mg BID w/ meals - Sliding scale   Risk Assessment: Patient continues to require inpatient hospitalization for safety and stabilization of acute opioid withdrawal and resolution of suicidal ideation.  Discharge Planning: Barriers to Discharge: Acute opioid withdrawal and suicidal ideation Estimated Length of Stay: 3-5 days Predicted Discharge Location: TBD, possibly son's home  INTERVAL HISTORY: Chart reviewed. No significant events overnight. On assessment today, patient reports improvement in opioid withdrawal symptoms. States he is no longer having nausea, chills, or diarrhea. Continues to endorse back pain and new-onset nasal congestion. Denies AVH.  Endorses passive suicidal ideation, stating he would be okay going to sleep and not waking up. Denies suicidal intent, but states he has thought of a plan in the past  to overdose on fentanyl . States he does not believe he would go through with the plan due to religious beliefs, and family obligations. Has not spoken with his son since hospitalization about tentative plan to discharge to son's house. Overall, tolerating Paxil well and does not endorse any side effects.  PRN's in the last 24 hours include hydroxyzine .  Physical Examination:  Vitals and nursing note reviewed MSK: Normal gait and station  MENTAL STATUS EXAM:  Appearance: Casually-dressed male, appropriate hygiene and grooming  Behavior: Cooperative  Attitude: Calm  Speech: Normal rate, rhythm, tone  Mood: Okay  Affect: dysthymic, congruent  Thought Process: linear, logical, goal-directed  Thought Content: no delusional thought content endorsed; not observed responding to internal stimuli  SI/HI: endorses passive SI  Perceptions: denies AVH  Judgement: Fair  Insight: Fair  Fund of Knowledge: WNL   Lab Results:  Admission on 11/26/2023  Component Date Value Ref Range Status   Hgb A1c MFr Bld 11/28/2023 6.8 (H)  4.8 - 5.6 % Final   Mean Plasma Glucose 11/28/2023 148.46  mg/dL Final   Glucose-Capillary 11/27/2023 270 (H)  70 - 99 mg/dL Final   Glucose-Capillary 11/27/2023 225 (H)  70 - 99 mg/dL Final   Glucose-Capillary 11/28/2023 213 (H)  70 - 99 mg/dL Final   Folate 88/82/7974 12.4  >5.9 ng/mL Final   RPR Ser Ql 11/28/2023 NON REACTIVE  NON REACTIVE Final   TSH 11/28/2023 1.110  0.350 - 4.500 uIU/mL Final   Vitamin B-12 11/28/2023 429  180 - 914 pg/mL Final   Vit D, 25-Hydroxy 11/28/2023 32.10  30 - 100 ng/mL Final   Glucose-Capillary 11/28/2023 143 (H)  70 - 99 mg/dL Final   Glucose-Capillary 11/28/2023 141 (H)  70 -  99 mg/dL Final   Comment 1 88/82/7974 Notify RN   Final   Glucose-Capillary 11/29/2023 160 (H)  70 - 99 mg/dL Final   Comment 1 88/81/7974 Notify RN   Final     Vitals: Blood pressure (!) 146/84, pulse 73, temperature 97.7 F (36.5 C), temperature source  Oral, resp. rate 16, height 5' 3 (1.6 m), weight 66.7 kg, SpO2 99%.    Ashley Gravely, MD PGY-1, Psychiatry Residency  11/29/2023, 1:46 PM

## 2023-11-29 NOTE — Progress Notes (Signed)
(  Sleep Hours) -8.25 as of 0530 (Any PRNs that were needed, meds refused, or side effects to meds)- none (Any disturbances and when (visitation, over night)-none (Concerns raised by the patient)- none (SI/HI/AVH)- denies all

## 2023-11-29 NOTE — [facility] Suicide Risk Assessment (Signed)
 [facility] INPATIENT:  Family/Significant Other Suicide Prevention Education  Suicide Prevention Education:  Education Completed; Son, Sherlean Arenas  [phone],  (name of family member/significant other) has been identified by the patient as the family member/significant other with whom the patient will be residing, and identified as the person(s) who will aid the patient in the event of a mental health crisis (suicidal ideations/suicide attempt).  With written consent from the patient, the family member/significant other has been provided the following suicide prevention education, prior to the and/or following the discharge of the patient.  Christian plans to pick pt up from the hospital on Thursday 11/20 at 1030AM. No safety concerns with pt living with him at discharge. There are no weapons or firearms in his home.   The suicide prevention education provided includes the following: Suicide risk factors Suicide prevention and interventions National Suicide Hotline telephone number [facility] assessment telephone number [facility] Emergency Assistance 911 [facility] and/or Residential Mobile Crisis Unit telephone number  Request made of family/significant other to: Remove weapons (e.g., guns, rifles, knives), all items previously/currently identified as safety concern.   Remove drugs/medications (over-the-counter, prescriptions, illicit drugs), all items previously/currently identified as a safety concern.  The family member/significant other verbalizes understanding of the suicide prevention education information provided.  The family member/significant other agrees to remove the items of safety concern listed above.  Jenkins LULLA Primer 11/29/2023, 12:02 PM

## 2023-11-30 DIAGNOSIS — E119 Type 2 diabetes mellitus without complications: Secondary | ICD-10-CM

## 2023-11-30 DIAGNOSIS — Z794 Long term (current) use of insulin: Secondary | ICD-10-CM

## 2023-11-30 LAB — GLUCOSE, CAPILLARY
Glucose-Capillary: 178 mg/dL — ABNORMAL HIGH (ref 70–99)
Glucose-Capillary: 229 mg/dL — ABNORMAL HIGH (ref 70–99)
Glucose-Capillary: 124 mg/dL — ABNORMAL HIGH (ref 70–99)

## 2023-11-30 LAB — MISC LABCORP TEST (SEND OUT): Labcorp test code: 83935

## 2023-11-30 MED ORDER — TRAZODONE HCL 50 MG PO TABS: 50.0000 mg | ORAL_TABLET | Freq: Every evening | ORAL | Status: DC | PRN

## 2023-11-30 MED ORDER — PAROXETINE HCL 20 MG PO TABS
20.0000 mg | ORAL_TABLET | Freq: Every day | ORAL | Status: DC
  Administered 2023-11-30: 20 mg via ORAL
  Filled 2023-11-30: qty 1

## 2023-11-30 NOTE — Progress Notes (Signed)
(  Sleep Hours) -8.75 (Any PRNs that were needed, meds refused, or side effects to meds)- none (Any disturbances and when (visitation, over night)-none (Concerns raised by the patient)- none (SI/HI/AVH)-denied

## 2023-11-30 NOTE — Plan of Care (Signed)

## 2023-11-30 NOTE — Plan of Care (Signed)
  Problem: Education: Goal: Mental status will improve Outcome: Progressing   

## 2023-11-30 NOTE — Group Note (Signed)
 Date:  11/30/2023 Time:  4:39 PM  Group Topic/Focus: Gut flora Making Healthy Choices:   The focus of this group is to help patients identify negative/unhealthy choices they were using prior to admission and identify positive/healthier coping strategies to replace them upon discharge.    Participation Level:  Active  Participation Quality:  Appropriate  Affect:  Appropriate  Cognitive:  Appropriate  Insight: Appropriate  Engagement in Group:  Engaged  Modes of Intervention:  Discussion and Education  Additional Comments:    Joshua Thomas 11/30/2023, 4:39 PM

## 2023-11-30 NOTE — Group Note (Signed)
 Recreation Therapy Group Note   Group Topic:Team Building  Group Date: 11/30/2023 Start Time: 0936 End Time: 1019 Facilitators: Verdelle Valtierra-McCall, LRT,CTRS Location: [address] Dayroom   Group Topic: Communication, Team Building, Problem Solving  Goal Area(s) Addresses:  Patient will effectively work with peer towards shared goal.  Patient will identify skills used to make activity successful.  Patient will identify how skills used during activity can be used to reach post d/c goals.   Behavioral Response:   Intervention: STEM Activity  Activity: Landing Pad. In teams of 3-5, patients were given 12 plastic drinking straws and an equal length of masking tape. Using the materials provided, patients were asked to build a landing pad to catch a golf ball dropped from approximately 5 feet in the air. All materials were required to be used by the team in their design. LRT facilitated post-activity discussion.  Education: Pharmacist, Community, Scientist, Physiological, Discharge Planning   Education Outcome: Acknowledges education/In group clarification offered/Needs additional education.    Affect/Mood: N/A   Participation Level: Did not attend    Clinical Observations/Individualized Feedback:      Plan: Continue to engage patient in RT group sessions 2-3x/week.   Binnie Droessler-McCall, LRT,CTRS 11/30/2023 12:59 PM

## 2023-11-30 NOTE — Group Note (Signed)
 Date:  11/30/2023 Time:  1:51 PM  Group Topic/Focus:  Chaplain Group     Participation Level:  Did Not Attend      Joshua Thomas 11/30/2023, 1:51 PM

## 2023-11-30 NOTE — Progress Notes (Signed)
 [facility] Inpatient Psychiatry Progress Note  Date: 11/30/2023 Patient: Joshua Thomas MRN: [id number]  ASSESSMENT: [name] is a 48 y/o male with a history significant for DM2, opioid and methamphetamine use disorders, HTN, and MDD, who presented to [facility] for suicidal ideation in the context of ongoing fentanyl  abuse and current withdrawal symptoms.   Patient no longer endorsing symptoms of opioid withdrawal. Somewhat dysphoric, however denies suicidal thoughts. Tolerating medications well. Discussed at length with patient need for outpatient follow-up at [facility] clinic for continued management of Suboxone . Patient will need confirmed appointment with provider at MAT clinic prior to discharge to ensure appropriate follow-up. If an appointment is confirmed, will plan for discharge tomorrow 11/20 to son's home.   PLAN: # Opioid use disorder, with opioid withdrawal, and opioid induced mood disorder - Buprenorphine  8-2 mg BID - Clonidine  taper - Continue Paxil 20 mg nightly for depression - Seroquel  100 mg at bedtime - Discontinued home sertraline  - COWS protocol   # DM2 - Metformin  500 mg BID w/ meals - Sliding scale insulin    Risk Assessment: Patient continues to require inpatient hospitalization for safety and stabilization of acute opioid withdrawal and resolution of suicidal ideation.  Discharge Planning: Barriers to Discharge: Acute opioid withdrawal and suicidal ideation Estimated Length of Stay: 3-5 days Predicted Discharge Location: TBD, possibly son's home  INTERVAL HISTORY: Chart reviewed. No significant events overnight. On assessment today, patient denies symptoms of opioid withdrawal. Denies current suicidal or homicidal ideation. Says his plan for discharge from hospital would be to stay with his son in [place]. Discussed with patient need for outpatient follow-up for continued management of Suboxone . Patient states he was regularly seeing a  physician, Dr. Jerome, for his Suboxone  treatment. Informed patient he will need to follow-up appointment confirmed prior to discharge, and we can bridge patient until appointment date.  PRN's in the last 24 hours include none.  Physical Examination:  Vitals and nursing note reviewed MSK: Normal gait and station  MENTAL STATUS EXAM:  Appearance: Casually-dressed male, appropriate hygiene and grooming  Behavior: Cooperative  Attitude: Calm  Speech: Normal rate, rhythm, tone  Mood: Okay  Affect: dysthymic, congruent  Thought Process: linear, logical, goal-directed  Thought Content: no delusional thought content endorsed; not observed responding to internal stimuli  SI/HI: denies  Perceptions: denies AVH  Judgement: Fair  Insight: Fair  Fund of Knowledge: WNL   Lab Results:  Admission on 11/26/2023  Component Date Value Ref Range Status   Hgb A1c MFr Bld 11/28/2023 6.8 (H)  4.8 - 5.6 % Final   Mean Plasma Glucose 11/28/2023 148.46  mg/dL Final   Glucose-Capillary 11/27/2023 270 (H)  70 - 99 mg/dL Final   Glucose-Capillary 11/27/2023 225 (H)  70 - 99 mg/dL Final   Glucose-Capillary 11/28/2023 213 (H)  70 - 99 mg/dL Final   Folate 88/82/7974 12.4  >5.9 ng/mL Final   RPR Ser Ql 11/28/2023 NON REACTIVE  NON REACTIVE Final   TSH 11/28/2023 1.110  0.350 - 4.500 uIU/mL Final   Vitamin B-12 11/28/2023 429  180 - 914 pg/mL Final   Vit D, 25-Hydroxy 11/28/2023 32.10  30 - 100 ng/mL Final   Glucose-Capillary 11/28/2023 143 (H)  70 - 99 mg/dL Final   Glucose-Capillary 11/28/2023 141 (H)  70 - 99 mg/dL Final   Comment 1 88/82/7974 Notify RN   Final   [facility] test code 11/28/2023 [id number]   Final   [facility] test name 11/28/2023 [id number]   Final  Source ([facility]) 11/28/2023 SST   Final   Misc [facility] result 11/28/2023 COMMENT   Final   Glucose-Capillary 11/29/2023 160 (H)  70 - 99 mg/dL Final   Comment 1 88/81/7974 Notify RN   Final   Glucose-Capillary 11/29/2023 197 (H)  70 - 99 mg/dL  Final   Glucose-Capillary 11/29/2023 218 (H)  70 - 99 mg/dL Final   Comment 1 88/81/7974 Notify RN   Final   Glucose-Capillary 11/30/2023 178 (H)  70 - 99 mg/dL Final   Comment 1 88/80/7974 Notify RN   Final   Glucose-Capillary 11/30/2023 229 (H)  70 - 99 mg/dL Final     Vitals: Blood pressure 120/81, pulse (!) 59, temperature 98 F (36.7 C), temperature source Oral, resp. rate 16, height 5' 3 (1.6 m), weight 66.7 kg, SpO2 100%.    Ashley Gravely, MD PGY-1, Psychiatry Residency  11/30/2023, 2:53 PM

## 2023-11-30 NOTE — Progress Notes (Signed)
   11/30/23 1100  Psych Admission Type (Psych Patients Only)  Admission Status Voluntary  Psychosocial Assessment  Patient Complaints Anxiety  Eye Contact Fair  Facial Expression Flat  Affect Anxious  Speech Logical/coherent  Interaction Isolative  Motor Activity Slow  Appearance/Hygiene Unremarkable  Behavior Characteristics Cooperative;Calm  Mood Anxious  Thought Process  Coherency WDL  Content WDL  Delusions None reported or observed  Perception WDL  Hallucination None reported or observed  Judgment Poor  Confusion None  Danger to Self  Current suicidal ideation? Denies  Self-Injurious Behavior No self-injurious ideation or behavior indicators observed or expressed   Agreement Not to Harm Self Yes  Description of Agreement Verbal  Danger to Others  Danger to Others None reported or observed

## 2023-11-30 NOTE — Group Note (Signed)
 Date:  11/30/2023 Time:  10:59 AM  Group Topic/Focus:  Recreational Therapy Group    Participation Level:  Did Not Attend   Joshua Thomas 11/30/2023, 10:59 AM

## 2023-11-30 NOTE — [facility] Group Notes (Signed)
 [facility] Group Notes:  (Nursing/MHT/Case Management/Adjunct)  Date:  11/30/2023  Time:  10:25 PM  Type of Therapy:  Psychoeducational Skills  Participation Level:  Active  Participation Quality:  Monopolizing  Affect:  Depressed and Flat  Cognitive:  Lacking  Insight:  Lacking  Engagement in Group:  Monopolizing  Modes of Intervention:  Education  Summary of Progress/Problems: Patient rated his day as a 6 or 7 out of a possible 10. He anticipates being discharged tomorrow and claims that he is unaware of his discharge plans. He made multiple complaints about the hospital and the staff.   Danica Camarena S 11/30/2023, 10:25 PM

## 2023-11-30 NOTE — Group Note (Signed)
 Date:  11/30/2023 Time:  10:42 AM  Group Topic/Focus:  Goals Group:   The focus of this group is to help patients establish daily goals to achieve during treatment and discuss how the patient can incorporate goal setting into their daily lives to aide in recovery. Identifying Needs:   The focus of this group is to help patients identify their personal needs that have been historically problematic and identify healthy behaviors to address their needs. Managing Feelings:   The focus of this group is to identify what feelings patients have difficulty handling and develop a plan to handle them in a healthier way upon discharge.    Participation Level:  Did Not Attend  Participation Quality:    Affect:    Cognitive:    Insight:   Engagement in Group:    Modes of Intervention:    Additional Comments:  Patient did not attend group.  Allyn Bartelson Byrd 11/30/2023, 10:42 AM

## 2023-11-30 NOTE — Group Note (Signed)
 Date:  11/30/2023 Time:  11:55 AM  Group Topic/Focus:  Pharmacy Group    Participation Level:  Did Not Attend   Branson Kranz Byrd 11/30/2023, 11:55 AM

## 2023-12-01 DIAGNOSIS — Z5900 Homelessness unspecified: Secondary | ICD-10-CM

## 2023-12-01 LAB — GLUCOSE, CAPILLARY
Glucose-Capillary: 180 mg/dL — ABNORMAL HIGH (ref 70–99)
Glucose-Capillary: 147 mg/dL — ABNORMAL HIGH (ref 70–99)

## 2023-12-01 MED ORDER — PAROXETINE HCL 20 MG PO TABS: 20.0000 mg | ORAL_TABLET | Freq: Every day | ORAL | 0 refills | Status: AC

## 2023-12-01 MED ORDER — BUPRENORPHINE HCL-NALOXONE HCL 8-2 MG SL FILM: 8.0000 mg | ORAL_FILM | Freq: Once | SUBLINGUAL | 0 refills | Status: AC

## 2023-12-01 MED ORDER — QUETIAPINE FUMARATE 100 MG PO TABS: 100.0000 mg | ORAL_TABLET | Freq: Every day | ORAL | 0 refills | Status: AC

## 2023-12-01 NOTE — [facility] Suicide Risk Assessment (Signed)
 Suicide Risk Assessment  Discharge Assessment    [facility] Discharge Suicide Risk Assessment   Principal Problem: Opioid dependence with opioid-induced mood disorder (HCC) Discharge Diagnoses: Principal Problem:   Opioid dependence with opioid-induced mood disorder (HCC) Active Problems:   Homelessness   Musculoskeletal: Strength & Muscle Tone: within normal limits Gait & Station: normal Patient leans: N/A  Psychiatric Specialty Exam  Presentation  General Appearance:  Appropriate for Environment; Casual  Eye Contact: Good  Speech: Clear and Coherent; Normal Rate  Speech Volume: Normal  Handedness: Right   Mood and Affect  Mood: Anxious  Duration of Depression Symptoms: No data recorded Affect: Appropriate; Restricted   Thought Process  Thought Processes: Coherent; Linear; Goal Directed  Descriptions of Associations:Intact  Orientation:Full (Time, Place and Person)  Thought Content:WDL  History of Schizophrenia/Schizoaffective disorder:No data recorded Duration of Psychotic Symptoms:No data recorded Hallucinations:Hallucinations: None  Ideas of Reference:None  Suicidal Thoughts:Suicidal Thoughts: No  Homicidal Thoughts:Homicidal Thoughts: No   Sensorium  Memory: Immediate Good; Recent Good; Remote Good  Judgment: Fair  Insight: Fair   Art Therapist  Concentration: Good  Attention Span: Good  Recall: Good  Fund of Knowledge: Good  Language: Good   Psychomotor Activity  Psychomotor Activity:Psychomotor Activity: Normal   Assets  Assets: Social Support; Desire for Improvement   Sleep  Sleep:Sleep: Good  Estimated Sleeping Duration (Last 24 Hours): 8.00-8.75 hours (Due to Daylight Saving Time, the durations displayed may not accurately represent documentation during the time change interval)  Physical Exam: Physical Exam Vitals and nursing note reviewed.  Constitutional:      General: He is not in acute  distress.    Appearance: Normal appearance. He is normal weight. He is not ill-appearing.  HENT:     Head: Normocephalic and atraumatic.  Pulmonary:     Effort: Pulmonary effort is normal. No respiratory distress.  Neurological:     General: No focal deficit present.     Mental Status: He is alert and oriented to person, place, and time. Mental status is at baseline.    Review of Systems  Gastrointestinal:  Negative for abdominal pain, constipation, diarrhea, nausea and vomiting.  Neurological:  Negative for dizziness and headaches.  All other systems reviewed and are negative.  Blood pressure 133/78, pulse (!) 56, temperature (!) 97.5 F (36.4 C), temperature source Oral, resp. rate 18, height 5' 3 (1.6 m), weight 66.7 kg, SpO2 100%. Body mass index is 26.04 kg/m.  Mental Status Per Nursing Assessment::   On Admission:     Demographic Factors:  Male, Caucasian, Low socioeconomic status, and Unemployed  Loss Factors: Financial problems/change in socioeconomic status  Historical Factors: NA  Risk Reduction Factors:   Sense of responsibility to family, Religious beliefs about death, and Living with another person, especially a relative  Continued Clinical Symptoms:  More than one psychiatric diagnosis Previous Psychiatric Diagnoses and Treatments  Cognitive Features That Contribute To Risk:  None    Suicide Risk:  Mild: Suicidal ideation of limited frequency, intensity, duration, and specificity. There are no identifiable plans, no associated intent, mild dysphoria and related symptoms, good self-control (both objective and subjective assessment), few other risk factors, and identifiable protective factors, including available and accessible social support.   Follow-up Information     Services, [facility] Follow up.   Why: You may go to this provider for an assessment, to obtain therapy and medication management services. Please bring a photo ID, insurance card  and social security card if available. Contact information:  [address] [place] [address] [phone]         [facility] [place]. Go to.   Why: Please go to this provider for medication management and suboxone  treatment as a walk-in, from 8 am to 6 pm, Monday through Friday. Contact information: [address] [place], [place] [address] Phone: [phone]        [facility]. Go on 12/02/2023.   Why: You have an appointment on 12/02/23 at 1:00 pm for suboxone  services, in person  If you need transportation, please call at 12:30 pm on this day. Contact information: [address], [place], [place] [address] Phone: [phone]                Discharge recommendations:   Continue psychiatric medications as prescribed. Follow up with outpatient psychiatric provider and primary care physician as scheduled. Follow-up for abnormal lab results: A1C 6.8 Follow-up for management of chronic disease: Type 2 diabetes   Abstain from or limit alcohol, illicit drugs, and tobacco due to their negative impact on psychiatric and medical health. In the event of worsening symptoms, the patient is instructed to call the national crisis hotline (988), 911, or go the the closest ED for appropriate evaluation and treatment of symptoms.  Activity: as tolerated Diet: Heart healthy   Joshua LOISE Gravely, MD 12/01/2023, 8:30 AM

## 2023-12-01 NOTE — Discharge Summary (Addendum)
 Physician Discharge Summary Note  Patient:  Joshua Thomas is an 48 y.o., male MRN:  [id number] DOB:  06/11/1975 Patient phone:  [phone] (home)  Patient address:   [address] [place] [address],   Date of Admission:  11/26/2023 Date of Discharge: 12/01/2023  Reason for Admission: [name] is a 48 y/o male with a history significant for DM2, opioid and methamphetamine use disorders, HTN, and MDD, who presented to [facility] for suicidal ideation in the context of ongoing fentanyl  abuse and current withdrawal symptoms.  Discharge Diagnoses:  Principal Problem:   Opioid dependence with opioid-induced mood disorder [facility]) Active Problems:   Homelessness   Hospital Course:    Patient presented with suicidal ideation in the context of current homelessness and social isolation, and a desire to detox from fentanyl . He was recently hospitalized from 10/4-10/14 after presenting with suicidal ideation and a plan to overdose on fentanyl , and was discharged on Suboxone  8-2 mg BID, quetiapine  100 mg at bedtime, and sertraline  50 mg daily. Upon admission, these medications were restarted, along with starting clonidine  taper for opioid withdrawal. Patient was placed under COWS protocol, and monitored for withdrawal symptoms. He had mild symptoms of nausea, chills, rhinorrhea, which resolved.   Patient reported limited effect on mood with Zoloft , therefore Zoloft  was discontinued and patient was started on Paxil 10 mg daily, which was increased to 20 mg daily. Patient denied any side effects to prescribed psychiatric medication. Over the course of hospitalization, the patient acclimated to the unit milieu and showed improvement in mood, anxiety, sleep, and appetite.  Leading up to discharge, the patient reported stable mood, denied suicidal or homicidal ideations for more than 48 hours. On day of discharge, patient reported his mood as okay, denied suicidal ideation, and stated he would be  discharging to his son's home in [place]. The patient had a confirmed appointment for the following day at MAT clinic with [facility] for refill of Suboxone . He was sent out with a one day bridge of Suboxone . The patient expressed motivation to continue prescribed medications and follow-up care. The patient was able to verbalize an individualized safety plan prior to discharge.   Mental Status Exam: Appearance: Casually-dressed male, appropriate hygiene and grooming  Behavior: Cooperative  Attitude: Calm  Speech: Normal rate, rhythm, tone  Mood: Okay  Affect: constricted, appropriate for situation  Thought Process: linear, logical, goal-directed  Thought Content: no delusional thought content endorsed; not observed responding to internal stimuli  SI/HI: denies  Perceptions: denies AVH  Judgement: Fair  Insight: Fair  Fund of Knowledge: WNL    Physical Exam  General: Well-appearing male. No acute distress. Pulmonary: Normal effort on room air.  Skin: No obvious rash or lesions. Neuro: A&Ox3.No focal deficits. MSK: Normal gait and station.  Review of Systems  No reported symptoms  Blood pressure 133/78, pulse (!) 56, temperature (!) 97.5 F (36.4 C), temperature source Oral, resp. rate 18, height 5' 3 (1.6 m), weight 66.7 kg, SpO2 100%. Body mass index is 26.04 kg/m.  Assets  Assets: desire for improvement, communication skills  Social History   Tobacco Use  Smoking Status Every Day   Current packs/day: 1.50   Average packs/day: 1.5 packs/day for 33.0 years (49.5 ttl pk-yrs)   Types: Cigarettes  Smokeless Tobacco Former   Quit date: 08/08/2006  Tobacco Comments   1 PPD   Tobacco Cessation:  A prescription for an FDA-approved tobacco cessation medication provided at discharge  Metabolic Disorder Labs:  Lab  Results  Component Value Date   HGBA1C 6.8 (H) 11/28/2023   MPG 148.46 11/28/2023   MPG 145.59 10/17/2023   Lab Results  Component Value Date   PROLACTIN  31.3 (H) 05/23/2015   Lab Results  Component Value Date   CHOL 138 10/17/2023   TRIG 66 10/17/2023   HDL 30 (L) 10/17/2023   CHOLHDL 4.6 10/17/2023   VLDL 13 10/17/2023   LDLCALC 95 10/17/2023   LDLCALC 83 12/09/2020     Is patient on multiple antipsychotic therapies at discharge:  No   Has Patient had three or more failed trials of antipsychotic monotherapy by history:  No  Recommended Plan for Multiple Antipsychotic Therapies: NA  Discharge Instructions     Diet - low sodium heart healthy   Complete by: As directed    Increase activity slowly   Complete by: As directed       Allergies as of 12/01/2023       Reactions   Tylenol  [acetaminophen ] Other (See Comments)   Due to Ulcers/ Liver Damage   Morphine  Itching        Medication List     STOP taking these medications    buprenorphine -naloxone  8-2 mg Subl SL tablet Commonly known as: SUBOXONE  Replaced by: Buprenorphine  HCl-Naloxone  HCl 8-2 MG Film   chlorhexidine  0.12 % solution Commonly known as: PERIDEX    sertraline  50 MG tablet Commonly known as: ZOLOFT        TAKE these medications      Indication  benzocaine 10 % mucosal gel Commonly known as: ORAJEL Use as directed in the mouth or throat 4 (four) times daily as needed for mouth pain.  Indication: Pain   Buprenorphine  HCl-Naloxone  HCl 8-2 MG Film Commonly known as: Suboxone  Place 1 Film under the tongue once for 1 dose. Replaces: buprenorphine -naloxone  8-2 mg Subl SL tablet  Indication: Opioid Dependence   hydrOXYzine  50 MG tablet Commonly known as: ATARAX  Take 1 tablet (50 mg total) by mouth 2 (two) times daily as needed for anxiety.  Indication: Feeling Anxious   metFORMIN  500 MG tablet Commonly known as: GLUCOPHAGE  Take 1 tablet (500 mg total) by mouth 2 (two) times daily with a meal for 14 days.  Indication: Type 2 Diabetes   naloxone  4 MG/0.1ML Liqd nasal spray kit Commonly known as: NARCAN  Take in case of overdose   Indication: Opioid Overdose   nicotine  21 mg/24hr patch Commonly known as: NICODERM CQ  - dosed in mg/24 hours Place 1 patch (21 mg total) onto the skin daily.  Indication: Nicotine  Addiction   PARoxetine 20 MG tablet Commonly known as: PAXIL Take 1 tablet (20 mg total) by mouth at bedtime.  Indication: Major Depressive Disorder   polyethylene glycol 17 g packet Commonly known as: MIRALAX  / GLYCOLAX  Take 17 g by mouth daily.  Indication: Constipation   QUEtiapine  100 MG tablet Commonly known as: SEROQUEL  Take 1 tablet (100 mg total) by mouth at bedtime.  Indication: Trouble Sleeping, Mood control   senna-docusate 8.6-50 MG tablet Commonly known as: Senokot-S Take 1 tablet by mouth 2 (two) times daily.  Indication: Constipation        Follow-up Information     Services, [facility] Follow up.   Why: You may go to this provider for an assessment, to obtain therapy and medication management services. Please bring a photo ID, insurance card and social security card if available. Contact information: [address] [place] [address] [phone]  [employer] [facility]. Go to.   Why: Please go to this provider for medication management and suboxone  treatment as a walk-in, from 8 am to 6 pm, Monday through Friday. Contact information: [address] [place], [place] [address] Phone: [phone]        [facility]. Go on 12/02/2023.   Why: You have an appointment on 12/02/23 at 1:00 pm for suboxone  services, in person  If you need transportation, please call at 12:30 pm on this day. Contact information: [address] [place], [place] [address] Phone: [phone]                Discharge recommendations:   Continue psychiatric medications as prescribed. Follow up with outpatient psychiatric provider and primary care physician as scheduled. Follow-up for abnormal lab results: A1C 6.8 Follow-up for management of chronic disease:  Type 2 diabetes   Abstain from or limit alcohol, illicit drugs, and tobacco due to their negative impact on psychiatric and medical health. In the event of worsening symptoms, the patient is instructed to call the national crisis hotline (988), 911, or go the the closest ED for appropriate evaluation and treatment of symptoms.  Activity: as tolerated Diet: Heart healthy    Ashley LOISE Gravely, MD, PGY-1 12/01/2023, 9:24 AM

## 2023-12-01 NOTE — Progress Notes (Signed)
(  Sleep Hours) - 10 (Any PRNs that were needed, meds refused, or side effects to meds)- None (Any disturbances and when (visitation, over night)- None (Concerns raised by the patient)- Pt concerned about what medications will be ordered upon his discharge (SI/HI/AVH)- Denied

## 2023-12-01 NOTE — Group Note (Signed)
 Date:  12/01/2023 Time:  10:09 AM  Group Topic/Focus:  Goals Group:   The focus of this group is to help patients establish daily goals to achieve during treatment and discuss how the patient can incorporate goal setting into their daily lives to aide in recovery.    Participation Level:  Did Not Attend  Participation Quality:  Did Not Attend  Affect:  Did Not Attend  Cognitive:  Did Not Attend  Insight: None  Engagement in Group:  Did Not Attend  Modes of Intervention:  Did Not Attend  Additional Comments:  Did Not Attend  Joshua Thomas 12/01/2023, 10:09 AM

## 2023-12-01 NOTE — Progress Notes (Signed)
 Pt discharged to lobby. Pt was stable and appreciative at that time. All papers and prescriptions were given and valuables returned. Verbal understanding expressed. Denies SI/HI and A/VH. Pt given opportunity to express concerns and ask questions.

## 2023-12-01 NOTE — Progress Notes (Signed)
  [facility] Adult Case Management Discharge Plan :  Will you be returning to the same living situation after discharge:  No. Pt will reside with his son.  At discharge, do you have transportation home?: Yes,  pt will be picked up by son around 1030AM Do you have the ability to pay for your medications: Yes,  pt has active health insurance coverage  Release of information consent forms completed and in the chart;  Patient's signature needed at discharge.  Patient to Follow up at:  Follow-up Information     Services, [facility] Follow up.   Why: You may go to this provider for an assessment, to obtain therapy and medication management services. Please bring a photo ID, insurance card and social security card if available. Contact information: [address] [place] [address] [phone]         [facility] [place]. Go to.   Why: Please go to this provider for medication management and suboxone  treatment as a walk-in, from 8 am to 6 pm, Monday through Friday. Contact information: [address] [place], [place] [address] Phone: [phone]        [facility]. Go on 12/02/2023.   Why: You have an appointment on 12/02/23 at 1:00 pm for suboxone  services, in person  If you need transportation, please call at 12:30 pm on this day. Contact information: [address], [place], [place] [address] Phone: [phone]                Next level of care provider has access to [facility] Link:no  Safety Planning and Suicide Prevention discussed: Yes,  Son, Sherlean Arenas [phone]     Has patient been referred to the Quitline?: Patient refused referral for treatment  Patient has been referred for addiction treatment: Patient refused referral for treatment. Refused residential treatment. Pt will follow up for suboxone  with HANS Jenkins LULLA Morley, LCSWA 12/01/2023, 8:58 AM

## 2023-12-07 NOTE — Progress Notes (Signed)

## 2024-01-31 ENCOUNTER — Emergency Department (HOSPITAL_COMMUNITY)
Admission: EM | Admit: 2024-01-31 | Discharge: 2024-01-31 | Disposition: A | Discharge status: Home / Self Care | Payer: Self-pay | Attending: Emergency Medicine | Admitting: Emergency Medicine

## 2024-01-31 ENCOUNTER — Other Ambulatory Visit: Payer: Self-pay

## 2024-01-31 ENCOUNTER — Encounter (HOSPITAL_COMMUNITY): Payer: Self-pay

## 2024-01-31 ENCOUNTER — Emergency Department (HOSPITAL_COMMUNITY): Payer: Self-pay

## 2024-01-31 DIAGNOSIS — N200 Calculus of kidney: Secondary | ICD-10-CM | POA: Insufficient documentation

## 2024-01-31 LAB — CBC WITH DIFFERENTIAL/PLATELET
Abs Immature Granulocytes: 0.03 K/uL (ref 0.00–0.07)
Basophils Absolute: 0 K/uL (ref 0.0–0.1)
Basophils Relative: 0 %
Eosinophils Absolute: 0.2 K/uL (ref 0.0–0.5)
Eosinophils Relative: 2 %
HCT: 42.9 % (ref 39.0–52.0)
Hemoglobin: 13.9 g/dL (ref 13.0–17.0)
Immature Granulocytes: 0 %
Lymphocytes Relative: 37 %
Lymphs Abs: 2.8 K/uL (ref 0.7–4.0)
MCH: 30.6 pg (ref 26.0–34.0)
MCHC: 32.4 g/dL (ref 30.0–36.0)
MCV: 94.5 fL (ref 80.0–100.0)
Monocytes Absolute: 0.5 K/uL (ref 0.1–1.0)
Monocytes Relative: 7 %
Neutro Abs: 3.9 K/uL (ref 1.7–7.7)
Neutrophils Relative %: 54 %
Platelets: 154 K/uL (ref 150–400)
RBC: 4.54 MIL/uL (ref 4.22–5.81)
RDW: 13.9 % (ref 11.5–15.5)
WBC: 7.5 K/uL (ref 4.0–10.5)
nRBC: 0 % (ref 0.0–0.2)

## 2024-01-31 LAB — URINALYSIS, COMPLETE (UACMP) WITH MICROSCOPIC
Bilirubin Urine: NEGATIVE
Glucose, UA: 150 mg/dL — AB
Hgb urine dipstick: NEGATIVE
Ketones, ur: NEGATIVE mg/dL
Nitrite: NEGATIVE
Protein, ur: NEGATIVE mg/dL
Specific Gravity, Urine: 1.011 (ref 1.005–1.030)
pH: 6 (ref 5.0–8.0)

## 2024-01-31 LAB — BASIC METABOLIC PANEL WITH GFR
Anion gap: 10 (ref 5–15)
BUN: 16 mg/dL (ref 6–20)
CO2: 28 mmol/L (ref 22–32)
Calcium: 9.1 mg/dL (ref 8.9–10.3)
Chloride: 99 mmol/L (ref 98–111)
Creatinine, Ser: 0.9 mg/dL (ref 0.61–1.24)
GFR, Estimated: >60 mL/min
Glucose, Bld: 237 mg/dL — ABNORMAL HIGH (ref 70–99)
Potassium: 3.6 mmol/L (ref 3.5–5.1)
Sodium: 136 mmol/L (ref 135–145)

## 2024-01-31 MED ORDER — TAMSULOSIN HCL 0.4 MG PO CAPS: 0.4000 mg | ORAL_CAPSULE | Freq: Every day | ORAL | 1 refills | Status: AC

## 2024-01-31 MED ORDER — KETOROLAC TROMETHAMINE 30 MG/ML IJ SOLN
30.0000 mg | Freq: Once | INTRAMUSCULAR | Status: AC
  Administered 2024-01-31: 30 mg via INTRAMUSCULAR

## 2024-01-31 MED ORDER — KETOROLAC TROMETHAMINE 30 MG/ML IJ SOLN
INTRAMUSCULAR | Status: AC
  Filled 2024-01-31: qty 1

## 2024-01-31 MED ORDER — OXYCODONE HCL 5 MG PO TABS
10.0000 mg | ORAL_TABLET | Freq: Once | ORAL | Status: AC
  Administered 2024-01-31: 10 mg via ORAL
  Filled 2024-01-31: qty 2

## 2024-01-31 MED ORDER — OXYCODONE-ACETAMINOPHEN 5-325 MG PO TABS: 1.0000 | ORAL_TABLET | Freq: Three times a day (TID) | ORAL | 0 refills | Status: AC | PRN

## 2024-01-31 NOTE — Discharge Instructions (Addendum)
 You were seen in the Emergency Department for kidney stone pain CAT scan showed small kidney stones No urinary tract infection As discussed take ibuprofen  600 mg every 6 hours for kidney stone pain For severe pain take Percocet which we have prescribed Do not drink alcohol or drive with taking Percocet We have also called in a prescription for Flomax  to help move the stone the lung Follow-up with [facility] at the number above if your symptoms worsen or persist Return to the emergency room for severe pain

## 2024-01-31 NOTE — ED Provider Triage Note (Signed)
 Emergency Medicine Provider Triage Evaluation Note  CARLY APPLEGATE , a 49 y.o. male  who complains of flank pain. Pt has a history of kidney stones   Review of Systems  Positive: Left flank pain Negative: fever  Physical Exam  BP (!) 162/109 (BP Location: Right Arm)   Pulse 85   Temp 98 F (36.7 C)   Resp 16   Ht 5' 3 (1.6 m)   Wt 75.3 kg   SpO2 100%   BMI 29.41 kg/m  Gen:   Awake, no distress   Resp:  Normal effort  MSK:   Moves extremities without difficulty  Other:    Medical Decision Making  Medically screening exam initiated at 5:05 PM.  Appropriate orders placed.  Jama LELON Meyers was informed that the remainder of the evaluation will be completed by another provider, this initial triage assessment does not replace that evaluation, and the importance of remaining in the ED until their evaluation is complete.     Flint Sonny POUR, PA-C 01/31/24 1708

## 2024-01-31 NOTE — ED Provider Notes (Signed)
 " [facility] Provider Note   CSN: [id number] Arrival date & time: 01/31/24  1611     Patient presents with: Back Pain and Flank Pain (/)   Joshua Thomas is a 49 y.o. male.  With a history of kidney stones who presents to the ED for left flank pain.  Left flank pain over the last 3 days feels similar to prior kidney stones.  Reports hematuria couple days ago.  Associated nausea.  No fevers chills.    Back Pain Flank Pain       Prior to Admission medications  Medication Sig Start Date End Date Taking? Authorizing Provider  oxyCODONE -acetaminophen  (PERCOCET/ROXICET) 5-325 MG tablet Take 1 tablet by mouth every 8 (eight) hours as needed for up to 3 days for severe pain (pain score 7-10). 01/31/24 02/03/24 Yes Pamella Ozell LABOR, DO  tamsulosin  (FLOMAX ) 0.4 MG CAPS capsule Take 1 capsule (0.4 mg total) by mouth daily. 01/31/24  Yes Pamella Ozell LABOR, DO  benzocaine  (ORAJEL) 10 % mucosal gel Use as directed in the mouth or throat 4 (four) times daily as needed for mouth pain. 10/25/23   Bennett, Christal H, NP  hydrOXYzine  (ATARAX ) 50 MG tablet Take 1 tablet (50 mg total) by mouth 2 (two) times daily as needed for anxiety. 10/25/23   Bennett, Christal H, NP  metFORMIN  (GLUCOPHAGE ) 500 MG tablet Take 1 tablet (500 mg total) by mouth 2 (two) times daily with a meal for 14 days. 10/25/23 11/08/23  Blair Robin H, NP  naloxone  (NARCAN ) nasal spray 4 mg/0.1 mL Take in case of overdose 08/07/20   Long, Joshua G, MD  nicotine  (NICODERM CQ  - DOSED IN MG/24 HOURS) 21 mg/24hr patch Place 1 patch (21 mg total) onto the skin daily. 10/25/23   Bennett, Christal H, NP  PARoxetine  (PAXIL ) 20 MG tablet Take 1 tablet (20 mg total) by mouth at bedtime. 12/01/23   Mannie Ashley SAILOR, MD  polyethylene glycol (MIRALAX  / GLYCOLAX ) 17 g packet Take 17 g by mouth daily. 10/25/23   Bennett, Christal H, NP  QUEtiapine  (SEROQUEL ) 100 MG tablet Take 1 tablet (100 mg total) by  mouth at bedtime. 12/01/23   Mannie Ashley SAILOR, MD  senna-docusate (SENOKOT-S) 8.6-50 MG tablet Take 1 tablet by mouth 2 (two) times daily. 10/25/23   Blair Robin DEL, NP    Allergies: Tylenol  [acetaminophen ] and Morphine     Review of Systems  Genitourinary:  Positive for flank pain.  Musculoskeletal:  Positive for back pain.    Updated Vital Signs BP (!) 132/98 (BP Location: Left Arm)   Pulse 78   Temp 98.3 F (36.8 C) (Oral)   Resp 16   Ht 5' 3 (1.6 m)   Wt 75.3 kg   SpO2 99%   BMI 29.41 kg/m   Physical Exam Vitals and nursing note reviewed.  HENT:     Head: Normocephalic and atraumatic.  Eyes:     Pupils: Pupils are equal, round, and reactive to light.  Cardiovascular:     Rate and Rhythm: Normal rate and regular rhythm.  Pulmonary:     Effort: Pulmonary effort is normal.     Breath sounds: Normal breath sounds.  Abdominal:     Palpations: Abdomen is soft.     Tenderness: There is no abdominal tenderness. There is no right CVA tenderness or left CVA tenderness.  Skin:    General: Skin is warm and dry.     Comments: Sebaceous cyst over  left lateral flank  Neurological:     Mental Status: He is alert.  Psychiatric:        Mood and Affect: Mood normal.     (all labs ordered are listed, but only abnormal results are displayed) Labs Reviewed  URINALYSIS, COMPLETE (UACMP) WITH MICROSCOPIC - Abnormal; Notable for the following components:      Result Value   Glucose, UA 150 (*)    Leukocytes,Ua TRACE (*)    Bacteria, UA RARE (*)    All other components within normal limits  BASIC METABOLIC PANEL WITH GFR - Abnormal; Notable for the following components:   Glucose, Bld 237 (*)    All other components within normal limits  CBC WITH DIFFERENTIAL/PLATELET    EKG: None  Radiology: CT Renal Stone Study Result Date: 01/31/2024 CLINICAL DATA:  Flank pain. EXAM: CT ABDOMEN AND PELVIS WITHOUT CONTRAST TECHNIQUE: Multidetector CT imaging of the abdomen and  pelvis was performed following the standard protocol without IV contrast. RADIATION DOSE REDUCTION: This exam was performed according to the departmental dose-optimization program which includes automated exposure control, adjustment of the mA and/or kV according to patient size and/or use of iterative reconstruction technique. COMPARISON:  None Available. FINDINGS: Lower chest: A stable 13 mm pulmonary nodule is seen within the left lung base (stable as far back as February 14, 2020. Hepatobiliary: No focal liver abnormality is seen. No gallstones, gallbladder wall thickening, or biliary dilatation. Pancreas: Unremarkable. No pancreatic ductal dilatation or surrounding inflammatory changes. Spleen: Small metallic density foci are seen within the region of the splenic hilum. The spleen is otherwise unremarkable. Adrenals/Urinary Tract: Adrenal glands are unremarkable. Kidneys are normal, without obstructing renal calculi, focal lesion, or hydronephrosis. 1 mm nonobstructing renal calculi are seen within both kidneys. Bladder is unremarkable. Stomach/Bowel: Stomach is within normal limits. Appendix appears normal. No evidence of bowel wall thickening, distention, or inflammatory changes. Vascular/Lymphatic: Aortic atherosclerosis. No enlarged abdominal or pelvic lymph nodes. Reproductive: Prostate is unremarkable. Other: No abdominal wall hernia or abnormality. No abdominopelvic ascites. Musculoskeletal: No acute or significant osseous findings. IMPRESSION: 1. Bilateral 1 mm nonobstructing renal calculi. 2. Stable, likely benign 13 mm left lower lobe pulmonary nodule. 3. Aortic atherosclerosis. Electronically Signed   By: Suzen Dials M.D.   On: 01/31/2024 18:54     Procedures   Medications Ordered in the ED  ketorolac  (TORADOL ) 30 MG/ML injection (has no administration in time range)  oxyCODONE  (Oxy IR/ROXICODONE ) immediate release tablet 10 mg (10 mg Oral Given 01/31/24 1754)  ketorolac  (TORADOL ) 30  MG/ML injection 30 mg (30 mg Intramuscular Given 01/31/24 2200)                                    Medical Decision Making 49 year old male with history as above presenting for left flank pain.  Feels similar to prior kidney stones.  No hematuria UTI.  Bilateral stones nonobstructive on CT.  Pain somewhat improved after Toradol .  Still having a good amount of discomfort but he does want to leave to catch the bus home.  Will give him a few days worth of opioid analgesics to help with the kidney stone pain along with Flomax .  Will provide the number for urology for follow-up  Risk Prescription drug management.        Final diagnoses:  Kidney stone    ED Discharge Orders          Ordered  oxyCODONE -acetaminophen  (PERCOCET/ROXICET) 5-325 MG tablet  Every 8 hours PRN        01/31/24 2304    tamsulosin  (FLOMAX ) 0.4 MG CAPS capsule  Daily        01/31/24 2304               Pamella Ozell LABOR, DO 01/31/24 2308  "

## 2024-01-31 NOTE — ED Triage Notes (Signed)
 Patient states that he has been having right flank pain for the past 3 days. He has has a history of kidney stones. He states he also has a cyst on his side that has been bothering him for the past 3 days as well.

## 2024-01-31 NOTE — ED Triage Notes (Signed)
 Pt c/o left mid back pain and [id number]. Pt denies urinary issues
